# Patient Record
Sex: Female | Born: 1945
Health system: Southern US, Community
[De-identification: ages and names within clinical notes are randomized; demographics above are authoritative.]

## PROBLEM LIST (undated history)

## (undated) DIAGNOSIS — R197 Diarrhea, unspecified: Secondary | ICD-10-CM

## (undated) DIAGNOSIS — F329 Major depressive disorder, single episode, unspecified: Secondary | ICD-10-CM

## (undated) DIAGNOSIS — E559 Vitamin D deficiency, unspecified: Secondary | ICD-10-CM

## (undated) DIAGNOSIS — E119 Type 2 diabetes mellitus without complications: Secondary | ICD-10-CM

## (undated) DIAGNOSIS — K5900 Constipation, unspecified: Secondary | ICD-10-CM

## (undated) DIAGNOSIS — G4700 Insomnia, unspecified: Secondary | ICD-10-CM

## (undated) DIAGNOSIS — C189 Malignant neoplasm of colon, unspecified: Secondary | ICD-10-CM

## (undated) DIAGNOSIS — J449 Chronic obstructive pulmonary disease, unspecified: Secondary | ICD-10-CM

## (undated) DIAGNOSIS — D62 Acute posthemorrhagic anemia: Secondary | ICD-10-CM

## (undated) DIAGNOSIS — E785 Hyperlipidemia, unspecified: Secondary | ICD-10-CM

## (undated) DIAGNOSIS — I272 Pulmonary hypertension, unspecified: Secondary | ICD-10-CM

## (undated) DIAGNOSIS — K56609 Unspecified intestinal obstruction, unspecified as to partial versus complete obstruction: Secondary | ICD-10-CM

## (undated) DIAGNOSIS — I10 Essential (primary) hypertension: Secondary | ICD-10-CM

## (undated) DIAGNOSIS — B3731 Acute candidiasis of vulva and vagina: Secondary | ICD-10-CM

## (undated) DIAGNOSIS — R4182 Altered mental status, unspecified: Secondary | ICD-10-CM

## (undated) DIAGNOSIS — F32A Depression, unspecified: Secondary | ICD-10-CM

## (undated) DIAGNOSIS — K5790 Diverticulosis of intestine, part unspecified, without perforation or abscess without bleeding: Secondary | ICD-10-CM

## (undated) DIAGNOSIS — R0902 Hypoxemia: Secondary | ICD-10-CM

## (undated) DIAGNOSIS — B373 Candidiasis of vulva and vagina: Secondary | ICD-10-CM

## (undated) DIAGNOSIS — D128 Benign neoplasm of rectum: Secondary | ICD-10-CM

## (undated) DIAGNOSIS — E210 Primary hyperparathyroidism: Secondary | ICD-10-CM

## (undated) DIAGNOSIS — J9611 Chronic respiratory failure with hypoxia: Secondary | ICD-10-CM

## (undated) DIAGNOSIS — Z9981 Dependence on supplemental oxygen: Secondary | ICD-10-CM

## (undated) DIAGNOSIS — F419 Anxiety disorder, unspecified: Secondary | ICD-10-CM

## (undated) HISTORY — DX: Depression, unspecified: F32.A

## (undated) HISTORY — DX: Acute candidiasis of vulva and vagina: B37.31

## (undated) HISTORY — DX: Constipation, unspecified: K59.00

## (undated) HISTORY — DX: Acute posthemorrhagic anemia: D62

## (undated) HISTORY — DX: Vitamin D deficiency, unspecified: E55.9

## (undated) HISTORY — DX: Benign neoplasm of rectum: D12.8

## (undated) HISTORY — PX: BREAST EXCISIONAL BIOPSY: SUR124

## (undated) HISTORY — PX: COLON RESECTION: SHX5231

## (undated) HISTORY — DX: Type 2 diabetes mellitus without complications: E11.9

## (undated) HISTORY — DX: Altered mental status, unspecified: R41.82

## (undated) HISTORY — DX: Primary hyperparathyroidism: E21.0

## (undated) HISTORY — DX: Unspecified intestinal obstruction, unspecified as to partial versus complete obstruction: K56.609

## (undated) HISTORY — PX: COLONOSCOPY: SHX174

## (undated) HISTORY — DX: Major depressive disorder, single episode, unspecified: F32.9

## (undated) HISTORY — DX: Diverticulosis of intestine, part unspecified, without perforation or abscess without bleeding: K57.90

## (undated) HISTORY — DX: Pulmonary hypertension, unspecified: I27.20

## (undated) HISTORY — DX: Dependence on supplemental oxygen: Z99.81

## (undated) HISTORY — DX: Chronic obstructive pulmonary disease, unspecified: J44.9

## (undated) HISTORY — DX: Hyperlipidemia, unspecified: E78.5

## (undated) HISTORY — DX: Anxiety disorder, unspecified: F41.9

## (undated) HISTORY — DX: Insomnia, unspecified: G47.00

## (undated) HISTORY — DX: Candidiasis of vulva and vagina: B37.3

## (undated) HISTORY — DX: Malignant neoplasm of colon, unspecified: C18.9

## (undated) HISTORY — DX: Chronic respiratory failure with hypoxia: J96.11

## (undated) HISTORY — PX: BREAST BIOPSY: SHX20

---
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
## 1986-01-02 DIAGNOSIS — C189 Malignant neoplasm of colon, unspecified: Secondary | ICD-10-CM

## 1986-01-02 HISTORY — DX: Malignant neoplasm of colon, unspecified: C18.9

## 1986-06-11 DIAGNOSIS — Z85038 Personal history of other malignant neoplasm of large intestine: Secondary | ICD-10-CM

## 1986-06-11 HISTORY — DX: Personal history of other malignant neoplasm of large intestine: Z85.038

## 2007-01-03 HISTORY — PX: ENDOMETRIAL BIOPSY: SHX622

## 2013-02-10 DIAGNOSIS — Z860100 Personal history of colon polyps, unspecified: Secondary | ICD-10-CM

## 2013-02-10 HISTORY — DX: Personal history of colonic polyps: Z86.010

## 2013-02-10 HISTORY — DX: Personal history of colon polyps, unspecified: Z86.0100

## 2013-02-23 DIAGNOSIS — D128 Benign neoplasm of rectum: Secondary | ICD-10-CM

## 2013-02-23 HISTORY — DX: Benign neoplasm of rectum: D12.8

## 2013-05-02 LAB — HM COLONOSCOPY

## 2013-10-02 LAB — HM DEXA SCAN

## 2014-12-03 LAB — HM PAP SMEAR: HM Pap smear: NORMAL

## 2015-01-03 HISTORY — PX: INCONTINENCE SURGERY: SHX676

## 2015-08-03 HISTORY — PX: ABDOMINAL HYSTERECTOMY: SHX81

## 2015-10-03 LAB — HM MAMMOGRAPHY: HM Mammogram: NORMAL (ref 0–4)

## 2015-11-18 ENCOUNTER — Emergency Department (HOSPITAL_COMMUNITY): Payer: Self-pay

## 2015-11-18 ENCOUNTER — Emergency Department (HOSPITAL_COMMUNITY)
Admission: EM | Admit: 2015-11-18 | Discharge: 2015-11-18 | Disposition: A | Discharge status: Home / Self Care | Payer: Self-pay | Attending: Emergency Medicine | Admitting: Emergency Medicine

## 2015-11-18 ENCOUNTER — Encounter (HOSPITAL_COMMUNITY): Payer: Self-pay | Admitting: Emergency Medicine

## 2015-11-18 DIAGNOSIS — R410 Disorientation, unspecified: Secondary | ICD-10-CM | POA: Insufficient documentation

## 2015-11-18 DIAGNOSIS — I10 Essential (primary) hypertension: Secondary | ICD-10-CM | POA: Insufficient documentation

## 2015-11-18 HISTORY — DX: Essential (primary) hypertension: I10

## 2015-11-18 LAB — CBC
HCT: 38.5 % (ref 36.0–46.0)
Hemoglobin: 13.1 g/dL (ref 12.0–15.0)
MCH: 27.6 pg (ref 26.0–34.0)
MCHC: 34 g/dL (ref 30.0–36.0)
MCV: 81.2 fL (ref 78.0–100.0)
Platelets: 226 10*3/uL (ref 150–400)
RBC: 4.74 MIL/uL (ref 3.87–5.11)
RDW: 14.6 % (ref 11.5–15.5)
WBC: 5.3 10*3/uL (ref 4.0–10.5)

## 2015-11-18 LAB — URINE MICROSCOPIC-ADD ON

## 2015-11-18 LAB — COMPREHENSIVE METABOLIC PANEL
ALBUMIN: 4.7 g/dL (ref 3.5–5.0)
ALK PHOS: 77 U/L (ref 38–126)
ALT: 16 U/L (ref 14–54)
AST: 25 U/L (ref 15–41)
Anion gap: 10 (ref 5–15)
BILIRUBIN TOTAL: 0.7 mg/dL (ref 0.3–1.2)
BUN: 14 mg/dL (ref 6–20)
CALCIUM: 11.9 mg/dL — AB (ref 8.9–10.3)
CO2: 25 mmol/L (ref 22–32)
Chloride: 106 mmol/L (ref 101–111)
Creatinine, Ser: 1.19 mg/dL — ABNORMAL HIGH (ref 0.44–1.00)
GFR calc Af Amer: 52 mL/min — ABNORMAL LOW (ref >60)
GFR calc non Af Amer: 45 mL/min — ABNORMAL LOW (ref >60)
GLUCOSE: 120 mg/dL — AB (ref 65–99)
Potassium: 3.4 mmol/L — ABNORMAL LOW (ref 3.5–5.1)
Sodium: 141 mmol/L (ref 135–145)
TOTAL PROTEIN: 7.6 g/dL (ref 6.5–8.1)

## 2015-11-18 LAB — URINALYSIS, ROUTINE W REFLEX MICROSCOPIC
BILIRUBIN URINE: NEGATIVE
GLUCOSE, UA: NEGATIVE mg/dL
HGB URINE DIPSTICK: NEGATIVE
Ketones, ur: NEGATIVE mg/dL
Nitrite: NEGATIVE
PROTEIN: 30 mg/dL — AB
SPECIFIC GRAVITY, URINE: 1.02 (ref 1.005–1.030)
pH: 6.5 (ref 5.0–8.0)

## 2015-11-18 LAB — CBG MONITORING, ED: GLUCOSE-CAPILLARY: 112 mg/dL — AB (ref 65–99)

## 2015-11-18 NOTE — ED Notes (Signed)
Went in to speak with pt and pt was not in room nor was family gown was found on the bed

## 2015-11-18 NOTE — ED Notes (Signed)
Pt left prior to discharge instructions

## 2015-11-18 NOTE — ED Triage Notes (Signed)
Pt here for some confusion x 3 days; per family pt was talking about people that may not have been there today; pt alert at present

## 2015-11-18 NOTE — ED Provider Notes (Signed)
[facility] DEPT Provider Note   CSN: [id number] Arrival date & time: 11/18/15  1806     History   Chief Complaint Chief Complaint  Patient presents with  . Altered Mental Status    HPI Denise [name] is a 70 y.o. female.  She presents for evaluation of periods of confusion including seeing people who were not actually there. According to her daughter. The patient moved to [place], one week ago from [place]. She lives here 30 years ago. Her daughter noticed some difficulty when the patient was visiting here in August 2017. At that time, the patient was intermittently confused. Since moving home, the daughter has noted that the patient has gotten lost, when driving, and needed help to get back home. She has been having trouble sleeping, so is using a sleeping pill about every other night. She has also had decreased appetite for several days. She denies fever, chills, nausea, vomiting, cough, shortness of breath, chest pain, weakness or dizziness. There are no other known modifying factors.  HPI  Past Medical History:  Diagnosis Date  . Hypertension     There are no active problems to display for this patient.   History reviewed. No pertinent surgical history.  OB History    No data available       Home Medications    Prior to Admission medications   Not on File    Family History History reviewed. No pertinent family history.  Social History Social History  Substance Use Topics  . Smoking status: Never Smoker  . Smokeless tobacco: Never Used  . Alcohol use No     Allergies   Patient has no known allergies.   Review of Systems Review of Systems  All other systems reviewed and are negative.    Physical Exam Updated Vital Signs BP 165/88 (BP Location: Left Arm)   Pulse 79   Temp 98.1 F (36.7 C) (Oral)   Resp 18   SpO2 94%   Physical Exam  Constitutional: She is oriented to person, place, and time. She appears well-developed. No  distress.  Elderly, frail  HENT:  Head: Normocephalic and atraumatic.  Eyes: Conjunctivae and EOM are normal. Pupils are equal, round, and reactive to light.  Neck: Normal range of motion and phonation normal. Neck supple.  Cardiovascular: Normal rate and regular rhythm.   Pulmonary/Chest: Effort normal and breath sounds normal. She exhibits no tenderness.  Abdominal: Soft. She exhibits no distension. There is no tenderness. There is no guarding.  Musculoskeletal: Normal range of motion.  Neurological: She is alert and oriented to person, place, and time. She exhibits normal muscle tone.  No dysarthria and aphasia or nystagmus  Skin: Skin is warm and dry.  Psychiatric: She has a normal mood and affect. Her behavior is normal. Judgment and thought content normal.  Nursing note and vitals reviewed.    ED Treatments / Results  Labs (all labs ordered are listed, but only abnormal results are displayed) Labs Reviewed  COMPREHENSIVE METABOLIC PANEL - Abnormal; Notable for the following:       Result Value   Potassium 3.4 (*)    Glucose, Bld 120 (*)    Creatinine, Ser 1.19 (*)    Calcium 11.9 (*)    GFR calc non Af Amer 45 (*)    GFR calc Af Amer 52 (*)    All other components within normal limits  URINALYSIS, ROUTINE W REFLEX MICROSCOPIC (NOT AT [facility]) - Abnormal; Notable for the following:    APPearance  CLOUDY (*)    Protein, ur 30 (*)    Leukocytes, UA SMALL (*)    All other components within normal limits  URINE MICROSCOPIC-ADD ON - Abnormal; Notable for the following:    Squamous Epithelial / LPF 6-30 (*)    Bacteria, UA FEW (*)    Casts HYALINE CASTS (*)    Crystals CA OXALATE CRYSTALS (*)    All other components within normal limits  CBG MONITORING, ED - Abnormal; Notable for the following:    Glucose-Capillary 112 (*)    All other components within normal limits  CBC    EKG  EKG Interpretation None       Radiology Ct Head Wo Contrast  Result Date:  11/18/2015 CLINICAL DATA:  Confusion. EXAM: CT HEAD WITHOUT CONTRAST TECHNIQUE: Contiguous axial images were obtained from the base of the skull through the vertex without intravenous contrast. COMPARISON:  None. FINDINGS: Brain: The age related cerebral atrophy, ventriculomegaly and periventricular white matter disease. No extra-axial fluid collections are identified. No CT findings for acute hemispheric infarction or intracranial hemorrhage. No mass lesions are identified. Vascular: Minimal vascular calcifications. No obvious aneurysm or hyperdense vessels. Skull: No skull fracture or bone lesion. Sinuses/Orbits: The paranasal sinuses and mastoid air cells are clear. The globes are intact. Other: No scalp lesion or scalp hematoma. IMPRESSION: 1. Age related cerebral atrophy, ventriculomegaly and periventricular white matter disease. 2. No CT findings for acute infarction or hemorrhage. Electronically Signed   By: Marijo Sanes M.D.   On: 11/18/2015 19:24    Procedures Procedures (including critical care time)  Medications Ordered in ED Medications - No data to display   Initial Impression / Assessment and Plan / ED Course  I have reviewed the triage vital signs and the nursing notes.  Pertinent labs & imaging results that were available during my care of the patient were reviewed by me and considered in my medical decision making (see chart for details).  Clinical Course     Medications - No data to display  Patient Vitals for the past 24 hrs:  BP Temp Temp src Pulse Resp SpO2  11/18/15 2037 165/88 98.1 F (36.7 C) Oral 79 18 94 %  11/18/15 1815 (!) 178/105 98.7 F (37.1 C) Oral 92 17 94 %    9:22 PM Reevaluation with update and discussion. After initial assessment and treatment, an updated evaluation reveals No change in clinical status. Findings discussed with patient and family members, all questions answered. Banner Huckaba L    Final Clinical Impressions(s) / ED Diagnoses    Final diagnoses:  Confusion   Nonspecific intermittent confusion, with normal neurologic and psychiatric evaluation. Differential diagnosis includes early onset dementia, medication related, confusion, and disorientation syndrome from recently moving back to this city. No CVA, metabolic instability, serious bacterial infection.  Nursing Notes Reviewed/ Care Coordinated Applicable Imaging Reviewed Interpretation of Laboratory Data incorporated into ED treatment  The patient appears reasonably screened and/or stabilized for discharge and I doubt any other medical condition or other [facility] requiring further screening, evaluation, or treatment in the ED at this time prior to discharge.  Plan: Home Medications- continue; Home Treatments- rest; return here if the recommended treatment, does not improve the symptoms; Recommended follow up- PCP f/u asap   New Prescriptions New Prescriptions   No medications on file     Daleen Bo, MD 11/18/15 2125

## 2015-11-18 NOTE — ED Notes (Signed)
Pt. Wheeled in to room with family appears in no distress family at bedside

## 2015-11-18 NOTE — Discharge Instructions (Signed)
Get plenty of rest and drink a lot of fluids.  Consider stopping your nighttime sleep aid medication.  Follow-up with a primary care doctor as soon as possible for further testing and treatment.

## 2015-11-22 ENCOUNTER — Encounter: Payer: Self-pay | Admitting: Primary Care

## 2015-11-22 ENCOUNTER — Ambulatory Visit (INDEPENDENT_AMBULATORY_CARE_PROVIDER_SITE_OTHER): Payer: Self-pay | Admitting: Primary Care

## 2015-11-22 VITALS — BP 122/76 | HR 72 | Temp 98.7°F | Ht 65.0 in | Wt 156.8 lb

## 2015-11-22 DIAGNOSIS — J449 Chronic obstructive pulmonary disease, unspecified: Secondary | ICD-10-CM

## 2015-11-22 DIAGNOSIS — R4182 Altered mental status, unspecified: Secondary | ICD-10-CM | POA: Insufficient documentation

## 2015-11-22 DIAGNOSIS — I10 Essential (primary) hypertension: Secondary | ICD-10-CM | POA: Insufficient documentation

## 2015-11-22 DIAGNOSIS — F419 Anxiety disorder, unspecified: Secondary | ICD-10-CM

## 2015-11-22 DIAGNOSIS — E559 Vitamin D deficiency, unspecified: Secondary | ICD-10-CM

## 2015-11-22 DIAGNOSIS — F32A Depression, unspecified: Secondary | ICD-10-CM | POA: Insufficient documentation

## 2015-11-22 DIAGNOSIS — G4700 Insomnia, unspecified: Secondary | ICD-10-CM | POA: Insufficient documentation

## 2015-11-22 DIAGNOSIS — F329 Major depressive disorder, single episode, unspecified: Secondary | ICD-10-CM

## 2015-11-22 DIAGNOSIS — E785 Hyperlipidemia, unspecified: Secondary | ICD-10-CM

## 2015-11-22 DIAGNOSIS — F418 Other specified anxiety disorders: Secondary | ICD-10-CM

## 2015-11-22 HISTORY — DX: Chronic obstructive pulmonary disease, unspecified: J44.9

## 2015-11-22 HISTORY — DX: Essential (primary) hypertension: I10

## 2015-11-22 MED ORDER — ALBUTEROL SULFATE HFA 108 (90 BASE) MCG/ACT IN AERS: 2.0000 | INHALATION_SPRAY | Freq: Four times a day (QID) | RESPIRATORY_TRACT | 2 refills | Status: DC | PRN

## 2015-11-22 MED ORDER — SERTRALINE HCL 100 MG PO TABS: 100.0000 mg | ORAL_TABLET | Freq: Every day | ORAL | 1 refills | Status: DC

## 2015-11-22 NOTE — Assessment & Plan Note (Signed)
Smoker of 40+ years, quit in 2011. Feels well managed on Spiriva, Rx provided for albuterol to use PRN. Discussed indications for use of albuterol.

## 2015-11-22 NOTE — Assessment & Plan Note (Signed)
Diagnosed years ago, feels well managed on Zoloft 100 mg. Continue same.

## 2015-11-22 NOTE — Patient Instructions (Addendum)
I sent an inhaler to your pharmacy called Albuterol. This is a rescue inhaler only to be used for breakthrough shortness of breath or wheezing. You may inhale 2 puffs into the lungs every 6 hours as needed for shortness of breath. Continue your Spiriva twice daily as prescribed.  Start your Vitamin D 50,000 capsules once weekly. Please schedule a lab only appointment in 3 months to recheck these levels.  Stop taking Zopiclone as this is likely causing memory problems. Try Melatonin 5 mg tablets for difficulty sleeping. Take 1 tablet by mouth 1 hour prior to bedtime. Please notify me if no improvement in 1 month.  I will obtain your records from [place] and will determine when I'd like to see you back for follow up.  It was a pleasure to meet you today! Please don't hesitate to call me with any questions. Welcome to [facility]!

## 2015-11-22 NOTE — Progress Notes (Signed)
Subjective:    Patient ID: Denise [name], female    DOB: July 26, 1945, 70 y.o.   MRN: [id number]  HPI  Denise [name] is a 70 year old female who presents today to establish care and discuss the problems mentioned below. Will obtain old records. She recently moved to the [place] from [place]. Her last physical was in September 2017.   1) Insomnia: Currently managed on Zopiclone 7.5 mg for the past 1 year. She takes this medication as she has difficulty sleeping due to constant ringing to both ears. She's tried Melatonin in the past with improvement. She has noticed memory issues.  2) COPD: She was smoker for 40+ years, and stopped smoking in 2011. Currently managed on Spiriva Handihaler. She does occasionally experience shortness of breath with moderate exertion. She does not currently possess an albuterol inhaler.   3) Vitamin D Deficiency: Long history of vitamin D deficiency. Recently prescribed Vitamin D 50,000 unit capsules for which she's not started at this point.   4) Anxiety and Depression: Diagnosed years ago. Currently managed on Zoloft 100 mg. She feels well managed on this medication. She denies SI/HI.  5) Essential Hypertension: Currently managed on nifedipine 60 mg an losartan 100 mg. Denies blurred vision, chest pain, dizziness. Her BP in the office today is 122/76.  6) Hyperlipidemia: Currently managed on atorvastatin 10 mg. She's endorses a recent lipid panel which was normal. She denies myalgias.  7) Memory Loss: Presented to the [facility] on 11/18/15 with a chief complaint of altered mental status. During her stay in the emergency department she underwent lab testing including CBC, CMP, UA. CMP with hyperglycemia (not fasting) and decreased renal function with GFR of 52. CT head was also obtained which was unremarkable. She was discharged home with recommendations for close PCP follow up.  She recently moved to the [place] from [place]. She was visiting in August 2017 when  her daughter noticed intermittent confusion, but didn't think anything of tihs. Since she's moved to the [place] she has gotten lost when driving and has forgotten how to get home.   Her sister is with her today who reports she's seeing people who weren't acutally there. No family history of dementia or Alzheimer's Disease.   Review of Systems  Constitutional: Negative for unexpected weight change.  Eyes: Negative for visual disturbance.  Respiratory: Negative for shortness of breath.   Cardiovascular: Negative for chest pain.  Genitourinary:       Post menopausal  Musculoskeletal: Negative for myalgias.  Allergic/Immunologic: Negative for environmental allergies.  Neurological: Negative for dizziness and weakness.  Hematological: Negative for adenopathy.  Psychiatric/Behavioral: Positive for confusion and sleep disturbance. Negative for suicidal ideas. The patient is not nervous/anxious.        Past Medical History:  Diagnosis Date  . Altered mental status   . Anxiety and depression   . COPD (chronic obstructive pulmonary disease) ([place])   . Hypertension   . Insomnia   . Vitamin D deficiency      Social History   Social History  . Marital status: Married    Spouse name: N/A  . Number of children: N/A  . Years of education: N/A   Occupational History  . Not on file.   Social History Main Topics  . Smoking status: Never Smoker  . Smokeless tobacco: Never Used  . Alcohol use No  . Drug use: No  . Sexual activity: Not on file   Other Topics Concern  . Not on file  Social History Narrative   Married.   Lives alone, husband to be joining her from [place].   Retired.       No past surgical history on file.  No family history on file.  No Known Allergies  No current outpatient prescriptions on file prior to visit.   No current facility-administered medications on file prior to visit.     BP 122/76   Pulse 72   Temp 98.7 F (37.1 C) (Oral)   Ht _0  (1.651  m)   Wt 156 lb 12.8 oz (71.1 kg)   SpO2 92%   BMI 26.09 kg/m    Objective:   Physical Exam  Constitutional: She is oriented to person, place, and time. She appears well-nourished.  Eyes: EOM are normal. Pupils are equal, round, and reactive to light.  Neck: Neck supple.  Cardiovascular: Normal rate and regular rhythm.   Pulmonary/Chest: Effort normal and breath sounds normal.  Neurological: She is alert and oriented to person, place, and time. No cranial nerve deficit.  Skin: Skin is warm and dry.  Psychiatric: She has a normal mood and affect.          Assessment & Plan:

## 2015-11-22 NOTE — Assessment & Plan Note (Signed)
Family initially noticed in August 2017. ED visit last week for AMS, unremarkable work up. Suspect this is due to either early onset dementia or her zopiclone.  Discussed to refrain from use of zopiclone as this is a high risk medication that is not prescribed in the [place]. Neuro exam unremarkable. She is alert and oriented and was able to provide all of the information for her visit today.  All ED labs, imaging, and notes reviewed.

## 2015-11-22 NOTE — Assessment & Plan Note (Signed)
Discussed to stop Zopiclone as this is likely contributing to confusion. Will have her try Melatonin 5 mg 1 hour HS. She will report if no improvement.

## 2015-11-22 NOTE — Assessment & Plan Note (Signed)
Managed on nifedipine 60 mg and losartan 100 mg. BP stable today. Will continue to monitor.

## 2015-11-22 NOTE — Assessment & Plan Note (Signed)
Managed on Lipitor 10 mg. Will obtain records for last lipid panel and LFT's.

## 2015-11-22 NOTE — Assessment & Plan Note (Signed)
Managed on 50,000 unit capsules, will have her start today. Will obtain records for last vitamin d level. Also repeat vitamin D in 3 months.

## 2016-01-22 ENCOUNTER — Encounter (HOSPITAL_COMMUNITY): Payer: Self-pay

## 2016-01-22 ENCOUNTER — Ambulatory Visit (HOSPITAL_COMMUNITY)
Admission: EM | Admit: 2016-01-22 | Discharge: 2016-01-22 | Disposition: A | Discharge status: Home / Self Care | Payer: Self-pay | Attending: Family Medicine | Admitting: Family Medicine

## 2016-01-22 DIAGNOSIS — R059 Cough, unspecified: Secondary | ICD-10-CM

## 2016-01-22 DIAGNOSIS — R05 Cough: Secondary | ICD-10-CM

## 2016-01-22 MED ORDER — IPRATROPIUM-ALBUTEROL 0.5-2.5 (3) MG/3ML IN SOLN
3.0000 mL | Freq: Once | RESPIRATORY_TRACT | Status: AC
  Administered 2016-01-22: 3 mL via RESPIRATORY_TRACT

## 2016-01-22 MED ORDER — ATORVASTATIN CALCIUM 10 MG PO TABS: 10.0000 mg | ORAL_TABLET | Freq: Every day | ORAL | 0 refills | Status: DC

## 2016-01-22 MED ORDER — IPRATROPIUM-ALBUTEROL 0.5-2.5 (3) MG/3ML IN SOLN
RESPIRATORY_TRACT | Status: AC
  Filled 2016-01-22: qty 3

## 2016-01-22 MED ORDER — AZITHROMYCIN 250 MG PO TABS: 250.0000 mg | ORAL_TABLET | Freq: Every day | ORAL | 0 refills | Status: AC

## 2016-01-22 MED ORDER — ALBUTEROL SULFATE HFA 108 (90 BASE) MCG/ACT IN AERS: 1.0000 | INHALATION_SPRAY | Freq: Four times a day (QID) | RESPIRATORY_TRACT | 0 refills | Status: DC | PRN

## 2016-01-22 NOTE — ED Provider Notes (Signed)
CSN: [id number]     Arrival date & time 01/22/16  1202 History   First MD Initiated Contact with Patient 01/22/16 1308     Chief Complaint  Patient presents with  . Cough   (Consider location/radiation/quality/duration/timing/severity/associated sxs/prior Treatment) Reports coughing for over 1 week with no improvement, but finding some relief with OTC delsym and coricidin. She has a history of COPD, is currently on Spiriva daily for her COPD. Patient reports that her COPD is normally well-controlled.  Patient recently moved to the area, stating that she currently does not have PCP. Patient is requesting refill of her atorvastatin 10 mg.    The history is provided by the patient.  Cough  Cough characteristics:  Productive Sputum characteristics:  Clear Severity:  Moderate Duration: over 1 week. Timing: was constant at first, now intermittent. Progression:  Unchanged Chronicity:  New Smoker: no   Context: not animal exposure, not exposure to allergens, not fumes, not smoke exposure and not upper respiratory infection   Relieved by:  Cough suppressants Associated symptoms: rhinorrhea and sinus congestion   Associated symptoms: no chest pain, no chills, no fever, no headaches, no shortness of breath, no sore throat and no wheezing     Past Medical History:  Diagnosis Date  . Altered mental status   . Anxiety and depression   . COPD (chronic obstructive pulmonary disease) ([place])   . Hyperlipidemia   . Hypertension   . Insomnia   . Vitamin D deficiency    Past Surgical History:  Procedure Laterality Date  . ABDOMINAL HYSTERECTOMY  08/2015   History reviewed. No pertinent family history. Social History  Substance Use Topics  . Smoking status: Never Smoker  . Smokeless tobacco: Never Used  . Alcohol use No   OB History    No data available     Review of Systems  Constitutional: Negative for chills, fatigue and fever.       As stated in the HPI  HENT: Positive for  congestion and rhinorrhea. Negative for sore throat.   Respiratory: Positive for cough. Negative for shortness of breath and wheezing.   Cardiovascular: Negative for chest pain and palpitations.  Gastrointestinal: Negative for abdominal pain.  Neurological: Negative for dizziness and headaches.    Allergies  Patient has no known allergies.  Home Medications   Prior to Admission medications   Medication Sig Start Date End Date Taking? Authorizing Provider  aspirin EC 81 MG tablet Take 81 mg by mouth daily.   Yes Historical Provider, MD  losartan (COZAAR) 100 MG tablet Take 100 mg by mouth daily. 09/16/15  Yes Historical Provider, MD  NIFEdipine (PROCARDIA-XL/ADALAT CC) 60 MG 24 hr tablet Take 60 mg by mouth daily. 09/16/15  Yes Historical Provider, MD  sertraline (ZOLOFT) 100 MG tablet Take 1 tablet (100 mg total) by mouth daily. 11/22/15  Yes [name], NP  SPIRIVA HANDIHALER 18 MCG inhalation capsule INHALE 1 CAPSULE DAILY 09/16/15  Yes Historical Provider, MD  Vitamin D, Ergocalciferol, (DRISDOL) 50000 units CAPS capsule Take 50,000 Units by mouth every 7 (seven) days.   Yes Historical Provider, MD  albuterol (PROVENTIL HFA;VENTOLIN HFA) 108 (90 Base) MCG/ACT inhaler Inhale 1-2 puffs into the lungs every 6 (six) hours as needed for wheezing or shortness of breath. 01/22/16   Barry Dienes, NP  atorvastatin (LIPITOR) 10 MG tablet Take 1 tablet (10 mg total) by mouth daily. 01/22/16 04/21/16  Barry Dienes, NP  azithromycin (ZITHROMAX) 250 MG tablet Take 1 tablet (250 mg  total) by mouth daily. Take first 2 tablets together, then 1 every day until finished. 01/22/16 01/27/16  Barry Dienes, NP  ibuprofen (ADVIL,MOTRIN) 600 MG tablet TAKE 1 TABLET EVERY 6 HOURS AS NEEDED FOR PAIN (MODERATE PAIN) 09/20/15   Historical Provider, MD   Meds Ordered and Administered this Visit   Medications  ipratropium-albuterol (DUONEB) 0.5-2.5 (3) MG/3ML nebulizer solution 3 mL (3 mLs Nebulization Given 01/22/16 1324)     BP 145/79 (BP Location: Right Arm)   Pulse 79   Temp 98.3 F (36.8 C) (Oral)   Resp 16   SpO2 95%  No data found.   Physical Exam  Constitutional: She is oriented to person, place, and time. She appears well-developed and well-nourished.  Eyes: Pupils are equal, round, and reactive to light.  Neck: Normal range of motion.  Cardiovascular: Normal rate, regular rhythm and normal heart sounds.   Pulmonary/Chest: Effort normal.  Has diffused wheezing throughout lung fields. No acute distress.  Abdominal: Soft. Bowel sounds are normal. There is no tenderness.  Neurological: She is alert and oriented to person, place, and time.  Skin: Skin is warm and dry.  Nursing note and vitals reviewed.   Urgent Care Course     Procedures (including critical care time)  Labs Review Labs Reviewed - No data to display  Imaging Review No results found.   MDM   1. Cough    1) DuoNeb given; wheezing completely resolved after treatment 2) Start Z-pack 3) Start albuterol inhaler at home every 6 hours PRN for wheezing. Continue the Spiriva 4) Continue with Delsym OTC for coughing 5) Atorvastatin refilled per request 6) Recommended to establish care with a PCP ASAP and follow up next week.     Barry Dienes, NP 01/22/16 1339

## 2016-01-22 NOTE — ED Triage Notes (Signed)
Patient presents to [facility] with complaints of productive cough x1 week, pt has taken OTC medication to treat cough with little relief. Pt has history of COPD No acute distress

## 2016-02-22 ENCOUNTER — Other Ambulatory Visit (INDEPENDENT_AMBULATORY_CARE_PROVIDER_SITE_OTHER): Payer: Self-pay

## 2016-02-22 DIAGNOSIS — E559 Vitamin D deficiency, unspecified: Secondary | ICD-10-CM

## 2016-02-22 LAB — VITAMIN D 25 HYDROXY (VIT D DEFICIENCY, FRACTURES): VITD: 80.21 ng/mL (ref 30.00–100.00)

## 2016-05-25 ENCOUNTER — Other Ambulatory Visit: Payer: Self-pay | Admitting: Primary Care

## 2016-05-25 ENCOUNTER — Other Ambulatory Visit: Payer: Self-pay

## 2016-05-25 DIAGNOSIS — I10 Essential (primary) hypertension: Secondary | ICD-10-CM

## 2016-05-25 DIAGNOSIS — E785 Hyperlipidemia, unspecified: Secondary | ICD-10-CM

## 2016-05-25 DIAGNOSIS — J449 Chronic obstructive pulmonary disease, unspecified: Secondary | ICD-10-CM

## 2016-05-25 MED ORDER — NIFEDIPINE ER 60 MG PO TB24: 60.0000 mg | ORAL_TABLET | Freq: Every day | ORAL | 0 refills | Status: DC

## 2016-05-25 MED ORDER — LOSARTAN POTASSIUM 100 MG PO TABS: 100.0000 mg | ORAL_TABLET | Freq: Every day | ORAL | 0 refills | Status: DC

## 2016-05-25 MED ORDER — ATORVASTATIN CALCIUM 10 MG PO TABS: 10.0000 mg | ORAL_TABLET | Freq: Every day | ORAL | 0 refills | Status: DC

## 2016-05-25 MED ORDER — SPIRIVA HANDIHALER 18 MCG IN CAPS: ORAL_CAPSULE | RESPIRATORY_TRACT | 2 refills | Status: DC

## 2016-05-25 NOTE — Telephone Encounter (Signed)
Ok to refill? Electronically refill request for NIFEdipine (PROCARDIA-XL/ADALAT CC) 60 MG 24 hr tablet.  Medication have not been prescribed by [facility]. Last seen on 11/22/2015

## 2016-05-25 NOTE — Telephone Encounter (Signed)
Pt left v/m requesting refill atorvastatin, losartan,nifedipine and spiriva to [facility] [address]. Pt established care on 11/22/15 and in office note Gentry Fitz NP wanted to get records from [place] to ck lipids and when pt need to be seen again; I do not see [place] records under media tab.Please advise. Pt is out of meds.

## 2016-05-30 NOTE — Telephone Encounter (Signed)
Message left for patient to return my call on 05/25/2016 and 05/30/2016

## 2016-06-01 NOTE — Telephone Encounter (Signed)
Pt scheduled OV for 6/1

## 2016-06-02 ENCOUNTER — Encounter: Payer: Self-pay | Admitting: Primary Care

## 2016-06-02 ENCOUNTER — Ambulatory Visit (INDEPENDENT_AMBULATORY_CARE_PROVIDER_SITE_OTHER)
Admission: RE | Admit: 2016-06-02 | Discharge: 2016-06-02 | Disposition: A | Discharge status: Home / Self Care | Payer: Self-pay | Source: Ambulatory Visit | Attending: Primary Care | Admitting: Primary Care

## 2016-06-02 ENCOUNTER — Ambulatory Visit (INDEPENDENT_AMBULATORY_CARE_PROVIDER_SITE_OTHER): Payer: Self-pay | Admitting: Primary Care

## 2016-06-02 VITALS — BP 128/80 | HR 70 | Temp 97.9°F | Ht 65.0 in | Wt 136.4 lb

## 2016-06-02 DIAGNOSIS — R634 Abnormal weight loss: Secondary | ICD-10-CM

## 2016-06-02 DIAGNOSIS — F419 Anxiety disorder, unspecified: Secondary | ICD-10-CM

## 2016-06-02 DIAGNOSIS — R630 Anorexia: Secondary | ICD-10-CM

## 2016-06-02 DIAGNOSIS — Z1239 Encounter for other screening for malignant neoplasm of breast: Secondary | ICD-10-CM

## 2016-06-02 DIAGNOSIS — J449 Chronic obstructive pulmonary disease, unspecified: Secondary | ICD-10-CM

## 2016-06-02 DIAGNOSIS — E119 Type 2 diabetes mellitus without complications: Secondary | ICD-10-CM

## 2016-06-02 DIAGNOSIS — I10 Essential (primary) hypertension: Secondary | ICD-10-CM

## 2016-06-02 DIAGNOSIS — E785 Hyperlipidemia, unspecified: Secondary | ICD-10-CM

## 2016-06-02 DIAGNOSIS — Z1231 Encounter for screening mammogram for malignant neoplasm of breast: Secondary | ICD-10-CM

## 2016-06-02 DIAGNOSIS — F329 Major depressive disorder, single episode, unspecified: Secondary | ICD-10-CM

## 2016-06-02 HISTORY — DX: Anorexia: R63.0

## 2016-06-02 MED ORDER — BUDESONIDE-FORMOTEROL FUMARATE 160-4.5 MCG/ACT IN AERO: 2.0000 | INHALATION_SPRAY | Freq: Two times a day (BID) | RESPIRATORY_TRACT | 1 refills | Status: DC

## 2016-06-02 MED ORDER — ALBUTEROL SULFATE HFA 108 (90 BASE) MCG/ACT IN AERS: 1.0000 | INHALATION_SPRAY | RESPIRATORY_TRACT | 0 refills | Status: DC | PRN

## 2016-06-02 MED ORDER — MIRTAZAPINE 15 MG PO TABS: 15.0000 mg | ORAL_TABLET | Freq: Every day | ORAL | 1 refills | Status: DC

## 2016-06-02 NOTE — Assessment & Plan Note (Signed)
Has been off of Zoloft for the past several months due to confusion with refills. Mild anxiety and depression noted, also with decrease in appetite. Given these symptoms, will treat with low-dose Remeron. We'll see her back in the office in one month for reevaluation.

## 2016-06-02 NOTE — Assessment & Plan Note (Signed)
Lipid panel pending today.

## 2016-06-02 NOTE — Assessment & Plan Note (Signed)
Weight loss of 20 pounds since her last visit in November 2017. Overall decrease in appetite due to early satiation.  No other alarm signs today. Labs pending today for further evaluation including A1c, TSH, hep C, HIV. Chest x-ray pending today. Mammogram pending. Initiated low-dose Remeron for appetite stimulant. Discussed to replace Glucerna shakes with actual meals. Follow-up in our clinic in 1 month for reevaluation.

## 2016-06-02 NOTE — Progress Notes (Signed)
Subjective:    Patient ID: Denise [name], female    DOB: 1945/09/21, 71 y.o.   MRN: [id number]  HPI  Denise [name] is a 71 year old female who presents today for follow up and multiple complaints.  1) Essential Hypertension: Currently managed on nifedipine 60 mg and losartan 100 mg. Her BP in the office today is 128/80. She denies chest pain, shortness of breath, dizziness.  2) Hyperlipidemia: Currently managed on Lipitor 10 mg. No recent lipid panel on file. She is fasting today.  3) COPD: Currently managed on Spiriva. Quit smoking in 2002. She's doing well on her current regimen but does notice occasional shortness of breath with exertion. She's using her albuterol infrequently. She could not afford her recent refill of Spiriva until her insurance kicks in until July 1st. She has 1 week left of her Spiriva. She does not have an updated albuterol inhaler.  4) Anxiety and Depression: Currently managed on Zoloft 100 mg. She hasn't taken her Zoloft in several months as she didn't realize she had refills at the pharmacy. She has occasional anxiety and depression and thinks the medication helped. She denies SI/HI.   5) Weight Loss: Overall her appetite has decreased and she's eating less. She feels very full quickly. Her decrease in appetite started in December 2017. She denies hemoptysis, night sweats, rectal bleeding, nausea, vomiting, abdominal pain. History of colon cancer in 1980's. She is due for her repeat colonoscopy in 2020 as she now gets them every 5 years.   Diet currently consists of:  Breakfast: Glucerna shake, cereal Lunch: Glucerna shake Dinner: Sandwich (a few bites), fast food (can't finish an adult meal) Snacks: Rarely, crackers, candy Desserts: Mints, occasional candy Beverages: Diet soda, water, coffee  Exercise: She is not exercising   Wt Readings from Last 3 Encounters:  06/02/16 136 lb 6.4 oz (61.9 kg)  11/22/15 156 lb 12.8 oz (71.1 kg)     Review of  Systems  Constitutional: Positive for appetite change and unexpected weight change. Negative for fatigue.  Respiratory: Negative for cough.        Intermittent exertional shortness of breath  Cardiovascular: Negative for chest pain.  Gastrointestinal: Negative for blood in stool.  Skin: Negative for color change.  Neurological: Negative for dizziness, weakness and headaches.  Psychiatric/Behavioral:       Mild depression and anxiety       Past Medical History:  Diagnosis Date  . Altered mental status   . Anxiety and depression   . COPD (chronic obstructive pulmonary disease) ([place])   . Hyperlipidemia   . Hypertension   . Insomnia   . Vitamin D deficiency      Social History   Social History  . Marital status: Married    Spouse name: N/A  . Number of children: N/A  . Years of education: N/A   Occupational History  . Not on file.   Social History Main Topics  . Smoking status: Never Smoker  . Smokeless tobacco: Never Used  . Alcohol use No  . Drug use: No  . Sexual activity: Not on file   Other Topics Concern  . Not on file   Social History Narrative   Married.   Lives alone, husband to be joining her from [place].   Retired.       Past Surgical History:  Procedure Laterality Date  . ABDOMINAL HYSTERECTOMY  08/2015    No family history on file.  No Known Allergies  Current Outpatient Prescriptions  on File Prior to Visit  Medication Sig Dispense Refill  . aspirin EC 81 MG tablet Take 81 mg by mouth daily.    [name] atorvastatin (LIPITOR) 10 MG tablet Take 1 tablet (10 mg total) by mouth daily. 90 tablet 0  . ibuprofen (ADVIL,MOTRIN) 600 MG tablet TAKE 1 TABLET EVERY 6 HOURS AS NEEDED FOR PAIN (MODERATE PAIN)  1  . losartan (COZAAR) 100 MG tablet Take 1 tablet (100 mg total) by mouth daily. 90 tablet 0  . NIFEdipine (PROCARDIA-XL/ADALAT CC) 60 MG 24 hr tablet Take 1 tablet (60 mg total) by mouth daily. 90 tablet 0  . SPIRIVA HANDIHALER 18 MCG inhalation  capsule INHALE 1 CAPSULE DAILY 30 capsule 2  . Vitamin D, Ergocalciferol, (DRISDOL) 50000 units CAPS capsule Take 50,000 Units by mouth every 7 (seven) days.     No current facility-administered medications on file prior to visit.     BP 128/80   Pulse 70   Temp 97.9 F (36.6 C) (Oral)   Ht 5' 5" (1.651 m)   Wt 136 lb 6.4 oz (61.9 kg)   SpO2 98%   BMI 22.70 kg/m    Objective:   Physical Exam  Constitutional: She is oriented to person, place, and time. She appears well-nourished.  Neck: Neck supple.  Cardiovascular: Normal rate and regular rhythm.   Pulmonary/Chest: Effort normal and breath sounds normal.  Neurological: She is alert and oriented to person, place, and time.  Skin: Skin is warm and dry.  Psychiatric: She has a normal mood and affect.          Assessment & Plan:

## 2016-06-02 NOTE — Assessment & Plan Note (Signed)
Seem to be out of Spiriva, does occasionally experience symptoms with Spiriva. We'll trial her on Symbicort. She will update once her insurance kicks in and she is able to afford the inhaler. Perception for albuterol inhaler sent to pharmacy to use as needed.

## 2016-06-02 NOTE — Patient Instructions (Addendum)
Complete lab work and xray prior to leaving today. I will notify you of your results once received.   Start mirtazapine 15 mg tablets for depression and appetite stimulant. Take 1 tablet by mouth every night at bedtime.  I sent a medication for your breathing to the pharmacy called Symbicort (budesonide-formoterol). Inhale 2 puffs into the lungs twice daily.   Please schedule a follow up visit in 1 month to re-evaluate weight loss and depression.  Call the [facility] to schedule your mammogram.   It was a pleasure to see you today!

## 2016-06-02 NOTE — Assessment & Plan Note (Signed)
Stable in the office today.  Continue current regimen. 

## 2016-06-03 LAB — CBC
HCT: 44.7 % (ref 35.0–45.0)
Hemoglobin: 14.7 g/dL (ref 11.7–15.5)
MCH: 27.7 pg (ref 27.0–33.0)
MCHC: 32.9 g/dL (ref 32.0–36.0)
MCV: 84.3 fL (ref 80.0–100.0)
MPV: 10.8 fL (ref 7.5–12.5)
PLATELETS: 217 10*3/uL (ref 140–400)
RBC: 5.3 MIL/uL — ABNORMAL HIGH (ref 3.80–5.10)
RDW: 14.8 % (ref 11.0–15.0)
WBC: 4.5 10*3/uL (ref 3.8–10.8)

## 2016-06-03 LAB — LIPID PANEL
CHOL/HDL RATIO: 3.2 ratio (ref <5.0)
CHOLESTEROL: 222 mg/dL — AB (ref <200)
HDL: 70 mg/dL (ref >50)
LDL Cholesterol: 124 mg/dL — ABNORMAL HIGH (ref <100)
Triglycerides: 141 mg/dL (ref <150)
VLDL: 28 mg/dL (ref <30)

## 2016-06-03 LAB — BASIC METABOLIC PANEL
BUN: 19 mg/dL (ref 7–25)
CALCIUM: 11.3 mg/dL — AB (ref 8.6–10.4)
CO2: 19 mmol/L — ABNORMAL LOW (ref 20–31)
CREATININE: 1.08 mg/dL — AB (ref 0.60–0.93)
Chloride: 105 mmol/L (ref 98–110)
GLUCOSE: 82 mg/dL (ref 65–99)
Potassium: 3.6 mmol/L (ref 3.5–5.3)
Sodium: 143 mmol/L (ref 135–146)

## 2016-06-03 LAB — HIV ANTIBODY (ROUTINE TESTING W REFLEX): HIV: NONREACTIVE

## 2016-06-03 LAB — HEMOGLOBIN A1C
Hgb A1c MFr Bld: 6 % — ABNORMAL HIGH (ref <5.7)
Mean Plasma Glucose: 126 mg/dL

## 2016-06-03 LAB — TSH: TSH: 0.49 m[IU]/L

## 2016-06-03 LAB — HEPATITIS C ANTIBODY: HCV AB: NEGATIVE

## 2016-06-05 ENCOUNTER — Other Ambulatory Visit: Payer: Self-pay | Admitting: Primary Care

## 2016-06-05 DIAGNOSIS — R634 Abnormal weight loss: Secondary | ICD-10-CM

## 2016-06-08 ENCOUNTER — Other Ambulatory Visit: Payer: Self-pay | Admitting: Primary Care

## 2016-06-08 DIAGNOSIS — F329 Major depressive disorder, single episode, unspecified: Secondary | ICD-10-CM

## 2016-06-08 DIAGNOSIS — F419 Anxiety disorder, unspecified: Principal | ICD-10-CM

## 2016-06-08 NOTE — Telephone Encounter (Signed)
Ok to refill? Electronically refill request for sertraline (ZOLOFT) 100 MG tablet.  Medication has been taken off of current medication this according to the notes on 06/02/2016 visit

## 2016-06-09 ENCOUNTER — Other Ambulatory Visit (INDEPENDENT_AMBULATORY_CARE_PROVIDER_SITE_OTHER): Payer: Self-pay

## 2016-06-09 ENCOUNTER — Other Ambulatory Visit: Payer: Self-pay | Admitting: Primary Care

## 2016-06-09 DIAGNOSIS — R634 Abnormal weight loss: Secondary | ICD-10-CM

## 2016-06-09 NOTE — Telephone Encounter (Signed)
She told me on her recent visit that she has not taken this medication in quite some time. I recently prescribed Mirtazipine that will take the place of Zoloft. Please verify that she's is in fact not taken Zoloft in a while as she mentioned last visit.

## 2016-06-13 LAB — PTH, INTACT AND CALCIUM
CALCIUM: 11.1 mg/dL — AB (ref 8.6–10.4)
PTH: 100 pg/mL — AB (ref 14–64)

## 2016-06-13 NOTE — Telephone Encounter (Signed)
Message left for patient to return my call.

## 2016-06-14 NOTE — Telephone Encounter (Signed)
Spoken to patient and inform to discard the refill request.

## 2016-06-15 ENCOUNTER — Encounter: Payer: Self-pay | Admitting: Primary Care

## 2016-06-15 ENCOUNTER — Other Ambulatory Visit: Payer: Self-pay | Admitting: Primary Care

## 2016-06-15 DIAGNOSIS — E210 Primary hyperparathyroidism: Secondary | ICD-10-CM

## 2016-07-06 ENCOUNTER — Other Ambulatory Visit: Payer: Self-pay | Admitting: Primary Care

## 2016-07-10 ENCOUNTER — Telehealth: Payer: Self-pay | Admitting: Primary Care

## 2016-07-10 DIAGNOSIS — N644 Mastodynia: Secondary | ICD-10-CM

## 2016-07-10 NOTE — Telephone Encounter (Signed)
Noted, cancelled screening mammogram, ordered diagnostic mammogram and ultrasound.

## 2016-07-10 NOTE — Telephone Encounter (Signed)
Patient called, she called [facility] to make [facility]G appt but told them she was having breast pain. They told her she had to have a diagnostic MMG and not a  Screening now. Pls cancel the screening and now pls order a Bilateral Breast Tomo diagnostic MMG. She is having Right  breast pain at [facility] and left breast pain at [facility]. Pls also order right Breast US and Left breast US also.

## 2016-07-19 ENCOUNTER — Other Ambulatory Visit: Payer: Self-pay | Admitting: Primary Care

## 2016-07-19 ENCOUNTER — Ambulatory Visit: Admission: RE | Admit: 2016-07-19 | Payer: Self-pay | Source: Ambulatory Visit

## 2016-07-19 ENCOUNTER — Ambulatory Visit: Payer: Self-pay

## 2016-07-19 ENCOUNTER — Ambulatory Visit
Admission: RE | Admit: 2016-07-19 | Discharge: 2016-07-19 | Disposition: A | Discharge status: Home / Self Care | Payer: Medicare Other | Source: Ambulatory Visit | Attending: Primary Care | Admitting: Primary Care

## 2016-07-19 DIAGNOSIS — R922 Inconclusive mammogram: Secondary | ICD-10-CM | POA: Diagnosis not present

## 2016-07-19 DIAGNOSIS — N644 Mastodynia: Secondary | ICD-10-CM

## 2016-07-19 DIAGNOSIS — R921 Mammographic calcification found on diagnostic imaging of breast: Secondary | ICD-10-CM

## 2016-07-27 ENCOUNTER — Ambulatory Visit (INDEPENDENT_AMBULATORY_CARE_PROVIDER_SITE_OTHER): Payer: Medicare Other | Admitting: Endocrinology

## 2016-07-27 ENCOUNTER — Encounter: Payer: Self-pay | Admitting: Endocrinology

## 2016-07-27 VITALS — BP 142/82 | HR 82 | Wt 144.8 lb

## 2016-07-27 DIAGNOSIS — J449 Chronic obstructive pulmonary disease, unspecified: Secondary | ICD-10-CM

## 2016-07-27 DIAGNOSIS — E210 Primary hyperparathyroidism: Secondary | ICD-10-CM | POA: Diagnosis not present

## 2016-07-27 HISTORY — DX: Primary hyperparathyroidism: E21.0

## 2016-07-27 NOTE — Progress Notes (Signed)
Subjective:    Patient ID: Denise [name], female    DOB: [birth year], 71 y.o.   MRN: [id number]  HPI Pt is referred by Dr Carlis Abbott, for hypercalcemia.  Pt was noted to have moderate hypercalcemia in 2017. she has never had osteoporosis, urolithiasis, thyroid probs, sarcoidosis, PUD, pancreatitis, depression, or bony fracture.  she does not take vitamin A supplement.  Pt denies taking antacids, Li++, or HCTZ. She stopped high-dose vit-D 1 month ago.  She has slight weight gain, but no assoc arthralgias.  She does not notice any nodule at the ant neck.   Past Medical History:  Diagnosis Date  . Altered mental status   . Anxiety and depression   . COPD (chronic obstructive pulmonary disease) ([place])   . Hyperlipidemia   . Hyperparathyroidism ([place])   . Hypertension   . Insomnia   . Vitamin D deficiency     Past Surgical History:  Procedure Laterality Date  . ABDOMINAL HYSTERECTOMY  08/2015  . BREAST BIOPSY Left   . BREAST EXCISIONAL BIOPSY Right   . COLON RESECTION  1980's  . ENDOMETRIAL BIOPSY  2009   negative  . INCONTINENCE SURGERY  2017  . POLYPECTOMY  2011    Social History   Social History  . Marital status: Married    Spouse name: N/A  . Number of children: N/A  . Years of education: N/A   Occupational History  . Not on file.   Social History Main Topics  . Smoking status: Never Smoker  . Smokeless tobacco: Never Used  . Alcohol use No  . Drug use: No  . Sexual activity: Not on file   Other Topics Concern  . Not on file   Social History Narrative   Married.   Lives alone, husband to be joining her from [place].   Retired.       Current Outpatient Prescriptions on File Prior to Visit  Medication Sig Dispense Refill  . albuterol (PROVENTIL HFA;VENTOLIN HFA) 108 (90 Base) MCG/ACT inhaler Inhale 1-2 puffs into the lungs every 4 (four) hours as needed for wheezing or shortness of breath. 1 Inhaler 0  . aspirin EC 81 MG tablet Take 81 mg by mouth daily.      [name] atorvastatin (LIPITOR) 10 MG tablet Take 1 tablet (10 mg total) by mouth daily. 90 tablet 0  . budesonide-formoterol (SYMBICORT) 160-4.5 MCG/ACT inhaler Inhale 2 puffs into the lungs 2 (two) times daily. 1 Inhaler 1  . ibuprofen (ADVIL,MOTRIN) 600 MG tablet TAKE 1 TABLET EVERY 6 HOURS AS NEEDED FOR PAIN (MODERATE PAIN)  1  . losartan (COZAAR) 100 MG tablet Take 1 tablet (100 mg total) by mouth daily. 90 tablet 0  . mirtazapine (REMERON) 15 MG tablet Take 1 tablet (15 mg total) by mouth at bedtime. 30 tablet 1  . NIFEdipine (PROCARDIA-XL/ADALAT CC) 60 MG 24 hr tablet Take 1 tablet (60 mg total) by mouth daily. 90 tablet 0  . SPIRIVA HANDIHALER 18 MCG inhalation capsule INHALE 1 CAPSULE DAILY 30 capsule 2   No current facility-administered medications on file prior to visit.     No Known Allergies  Family History  Problem Relation Age of Onset  . Breast cancer Neg Hx   . Hyperparathyroidism Neg Hx     BP (!) 142/82   Pulse 82   Wt 144 lb 12.8 oz (65.7 kg)   SpO2 92%   BMI 24.10 kg/m    Review of Systems denies fatigue, galactorrhea, hematuria, memory loss, menopausal  sxs, numbness, myalgias, abdominal pain, urinary frequency, hypoglycemia, skin rash, visual loss, diarrhea, rhinorrhea, easy bruising, and depression.  No change in chronic doe     Objective:   Physical Exam VS: see vs page GEN: no distress HEAD: head: no deformity eyes: no periorbital swelling, no proptosis external nose and ears are normal mouth: no lesion seen NECK: supple, thyroid is not enlarged. CHEST WALL: no deformity.  No kyphosis.  LUNGS: clear to auscultation CV: reg rate and rhythm, no murmur ABD: abdomen is soft, nontender.  no hepatosplenomegaly.  not distended.  no hernia MUSCULOSKELETAL: muscle bulk and strength are grossly normal.  no obvious joint swelling.  gait is normal and steady EXTEMITIES: no deformity of the hands.  no edema PULSES: no carotid bruit NEURO:  cn 2-12 grossly intact.    readily moves all 4's.  sensation is intact to touch on all 4's SKIN:  Normal texture and temperature.  No rash or suspicious lesion is visible.   NODES:  None palpable at the neck.   PSYCH: alert, well-oriented.  Does not appear anxious nor depressed.   Lab Results  Component Value Date   PTH 100 (H) 06/09/2016   PTH CANCELED 06/09/2016   CALCIUM 11.1 (H) 06/09/2016   CALCIUM CANCELED 06/09/2016   25-OH vit-D=80  DEXA (2017, in [place]): normal.    Lab Results  Component Value Date   TSH 0.49 06/02/2016   I personally reviewed spirometry tracing (today): Indication: copd Impression: severe obstruction No comparison is available     Assessment & Plan:  Primary hyperparathyroidism, new to me.  Ca++ was 11.9, so she should consider surgery COPD, severe.  She should see pulm, to discuss surgical clearance.    Patient Instructions  Please see surgery and lung specialists.  you will receive a phone call, about days and times for appointments.

## 2016-07-27 NOTE — Patient Instructions (Signed)
Please see surgery and lung specialists.  you will receive a phone call, about days and times for appointments.

## 2016-08-02 ENCOUNTER — Ambulatory Visit
Admission: RE | Admit: 2016-08-02 | Discharge: 2016-08-02 | Disposition: A | Discharge status: Home / Self Care | Payer: Medicare Other | Source: Ambulatory Visit | Attending: Primary Care | Admitting: Primary Care

## 2016-08-02 ENCOUNTER — Other Ambulatory Visit: Payer: Self-pay | Admitting: Primary Care

## 2016-08-02 DIAGNOSIS — R921 Mammographic calcification found on diagnostic imaging of breast: Secondary | ICD-10-CM

## 2016-08-02 DIAGNOSIS — N6489 Other specified disorders of breast: Secondary | ICD-10-CM | POA: Diagnosis not present

## 2016-08-03 ENCOUNTER — Other Ambulatory Visit: Payer: Self-pay | Admitting: Primary Care

## 2016-08-03 DIAGNOSIS — F329 Major depressive disorder, single episode, unspecified: Secondary | ICD-10-CM

## 2016-08-03 DIAGNOSIS — R634 Abnormal weight loss: Secondary | ICD-10-CM

## 2016-08-03 DIAGNOSIS — F419 Anxiety disorder, unspecified: Secondary | ICD-10-CM

## 2016-08-03 NOTE — Telephone Encounter (Signed)
Patient says she does think it is helping her appetite a little.  She says she has gained a few pounds.  She has been taking the medication and says it also helps her sleep.

## 2016-08-03 NOTE — Telephone Encounter (Signed)
How is her appetite? Is she taking the mirtazapine? This was prescribed to help stimulate appetite as she reported a decrease in appetite in June this year.

## 2016-08-03 NOTE — Telephone Encounter (Signed)
Ok to refill? Electronically refill request for mirtazapine (REMERON) 15 MG tablet.  Last prescribed on 06/02/2016. Last seen on 06/02/2016

## 2016-08-04 MED ORDER — MIRTAZAPINE 15 MG PO TABS: ORAL_TABLET | ORAL | 1 refills | Status: DC

## 2016-08-04 NOTE — Telephone Encounter (Signed)
Glad to hear it. Refills sent to pharmacy.

## 2016-08-21 DIAGNOSIS — E210 Primary hyperparathyroidism: Secondary | ICD-10-CM | POA: Diagnosis not present

## 2016-08-22 ENCOUNTER — Other Ambulatory Visit: Payer: Self-pay | Admitting: Primary Care

## 2016-08-22 ENCOUNTER — Other Ambulatory Visit (HOSPITAL_COMMUNITY): Payer: Self-pay | Admitting: General Surgery

## 2016-08-22 DIAGNOSIS — E210 Primary hyperparathyroidism: Secondary | ICD-10-CM

## 2016-08-22 DIAGNOSIS — E785 Hyperlipidemia, unspecified: Secondary | ICD-10-CM

## 2016-08-22 NOTE — Telephone Encounter (Signed)
Refills sent to pharmacy. Would like to recheck her cholesterol in 2 months, please schedule fasting lab only appointment.

## 2016-08-22 NOTE — Telephone Encounter (Signed)
Ok to refill? Electronically refill request for atorvastatin (LIPITOR) 10 MG tablet  Last prescribed on 05/25/2016. Lipid was checked on 06/02/2016

## 2016-08-23 NOTE — Telephone Encounter (Signed)
Left a detailed message on Vm per DPR that she needs to schedule a fasting lab appt.

## 2016-08-25 NOTE — Telephone Encounter (Signed)
Per DPR, left detail message of Denise [name]'s comments for patient to call back. 

## 2016-08-28 NOTE — Telephone Encounter (Signed)
Per DPR, left detail message of Kate's comments for patient to call back.

## 2016-08-29 ENCOUNTER — Encounter (HOSPITAL_COMMUNITY)
Admission: RE | Admit: 2016-08-29 | Discharge: 2016-08-29 | Disposition: A | Discharge status: Home / Self Care | Payer: Medicare Other | Source: Ambulatory Visit | Attending: General Surgery | Admitting: General Surgery

## 2016-08-29 DIAGNOSIS — E210 Primary hyperparathyroidism: Secondary | ICD-10-CM | POA: Diagnosis not present

## 2016-08-29 DIAGNOSIS — D351 Benign neoplasm of parathyroid gland: Secondary | ICD-10-CM | POA: Diagnosis not present

## 2016-08-29 MED ORDER — TECHNETIUM TC 99M SESTAMIBI GENERIC - CARDIOLITE
25.0000 | Freq: Once | INTRAVENOUS | Status: AC | PRN
  Administered 2016-08-29: 25 via INTRAVENOUS

## 2016-09-01 NOTE — Telephone Encounter (Signed)
Message left for patient to return my call.

## 2016-09-11 ENCOUNTER — Encounter: Payer: Self-pay | Admitting: Internal Medicine

## 2016-09-11 ENCOUNTER — Ambulatory Visit (INDEPENDENT_AMBULATORY_CARE_PROVIDER_SITE_OTHER): Payer: Medicare Other | Admitting: Internal Medicine

## 2016-09-11 VITALS — BP 138/80 | HR 71 | Ht 66.0 in | Wt 143.2 lb

## 2016-09-11 DIAGNOSIS — J449 Chronic obstructive pulmonary disease, unspecified: Secondary | ICD-10-CM | POA: Diagnosis not present

## 2016-09-11 MED ORDER — BUDESONIDE-FORMOTEROL FUMARATE 160-4.5 MCG/ACT IN AERO: 2.0000 | INHALATION_SPRAY | Freq: Two times a day (BID) | RESPIRATORY_TRACT | 1 refills | Status: DC

## 2016-09-11 NOTE — Progress Notes (Signed)
Subjective:     Patient ID: Denise [name], female   DOB: 1945/09/18,    MRN: [id number]  HPI  38 yobf  Quit smoking 1989 with doe then dx'd with copd in [place] in mid 1990s where lived x 25 years and started on spiriva around 2005-10 and helped some and since 2017 back in [place] and breathing generally better in [place] but not doing as many steps and changed from spiriva to symb but not taking it regulary and no need for rescue inhaler referred to pulmonary clinic 09/11/2016 by Dr   Malena Edman.   09/11/2016 1st [facility] office visit/ Arpan Eskelson   Chief Complaint  Patient presents with  . pulmonary consult    Referred by Dr. Carlis Abbott for COPD. patient states she is out of breath on exertion, denies chest pain and chest tightness  doe = MMRC2 = can't walk a nl pace on a flat grade s sob but does fine slow and flat eg shopping   No obvious day to day or daytime variability or assoc excess/ purulent sputum or mucus plugs or hemoptysis or cp or chest tightness, subjective wheeze or overt sinus or hb symptoms. No unusual exp hx or h/o childhood pna/ asthma or knowledge of premature birth.  Sleeping ok without nocturnal  or early am exacerbation  of respiratory  c/o's or need for noct saba. Also denies any obvious fluctuation of symptoms with weather or environmental changes or other aggravating or alleviating factors except as outlined above   Current Medications, Allergies, Complete Past Medical History, Past Surgical History, Family History, and Social History were reviewed in [facility] record.  ROS  The following are not active complaints unless bolded sore throat, dysphagia, dental problems, itching, sneezing,  nasal congestion or excess/ purulent secretions, ear ache,   fever, chills, sweats, unintended wt loss, classically pleuritic or exertional cp,  orthopnea pnd or leg swelling, presyncope, palpitations, abdominal pain, anorexia, nausea, vomiting, diarrhea   or change in bowel or bladder habits, change in stools or urine, dysuria,hematuria,  rash, arthralgias, visual complaints, headache, numbness, weakness or ataxia or problems with walking or coordination,  change in mood/affect or memory.            Review of Systems     Objective:   Physical Exam    amb pleasant  fm nad  Wt Readings from Last 3 Encounters:  07/27/16 144 lb 12.8 oz (65.7 kg)  06/02/16 136 lb 6.4 oz (61.9 kg)  11/22/15 156 lb 12.8 oz (71.1 kg)    Vital signs reviewed - Note on arrival 02 sats  96% on RA     HEENT: nl   turbinates bilaterally, and oropharynx. Nl external ear canals without cough reflex - edentulous   NECK :  without JVD/Nodes/TM/ nl carotid upstrokes bilaterally   LUNGS: no acc muscle use,  slt barrel contour chest with distant bs bilaterally s wheeze   CV:  RRR  no s3 or murmur or increase in P2, and no edema   ABD:  soft and nontender with nl inspiratory excursion in the supine position. No bruits or organomegaly appreciated, bowel sounds nl  MS:  Nl gait/ ext warm without deformities, calf tenderness, cyanosis or clubbing No obvious joint restrictions   SKIN: warm and dry without lesions    NEURO:  alert, approp, nl sensorium with  no motor or cerebellar deficits apparent.     I personally reviewed images and agree with radiology impression as follows:  CXR:   06/02/2016 COPD and mild right basilar scarring. No active cardiopulmonary Disease.    Assessment:

## 2016-09-11 NOTE — Assessment & Plan Note (Signed)
Quit smoking 1989 - Spirometry 09/11/2016  FEV1 1.03 (52%)  Ratio 54 mild curvature, on no rx - 09/11/2016  After extensive coaching HFA effectiveness =    50% from baseline 10% > continue  symb 160 2bid but consider change to bevespi or stiolto   Pt is Group B in terms of symptom/risk and laba/lama therefore appropriate rx at this point but since already has symbicort 160 suggested she continue it and f/u in 3 months to regroup then   Total time devoted to counseling  > 50 % of initial 60 min office visit:  review case with pt/ discussion of options/alternatives/ personally creating written customized instructions  in presence of pt  then going over those specific  Instructions directly with the pt including how to use all of the meds but in particular covering each new medication in detail and the difference between the maintenance= "automatic" meds and the prns using an action plan format for the latter (If this problem/symptom => do that organization reading Left to right).  Please see AVS from this visit for a full list of these instructions which I personally wrote for this pt and  are unique to this visit.

## 2016-09-11 NOTE — Patient Instructions (Addendum)
Plan A = Automatic =  symbicort 160 up to 2 every 12 hours  Work on inhaler technique:  relax and gently blow all the way out then take a nice smooth deep breath back in, triggering the inhaler at same time you start breathing in.  Hold for up to 5 seconds if you can. Blow out thru nose. Rinse and gargle with water when done      Plan B = Backup Only use your albuterol (ventolin) as a rescue medication to be used if you can't catch your breath by resting or doing a relaxed purse lip breathing pattern.  - The less you use it, the better it will work when you need it. - Ok to use the inhaler up to 2 puffs  every 4 hours if you must but call for appointment if use goes up over your usual need - Don't leave home without it !!  (think of it like the spare tire for your car)       Please schedule a follow up visit in 3 months but call sooner if needed

## 2016-09-12 ENCOUNTER — Other Ambulatory Visit: Payer: Self-pay | Admitting: Primary Care

## 2016-09-12 DIAGNOSIS — I10 Essential (primary) hypertension: Secondary | ICD-10-CM

## 2016-10-18 DIAGNOSIS — Z23 Encounter for immunization: Secondary | ICD-10-CM | POA: Diagnosis not present

## 2016-11-16 ENCOUNTER — Telehealth: Payer: Self-pay | Admitting: Primary Care

## 2016-11-16 DIAGNOSIS — E785 Hyperlipidemia, unspecified: Secondary | ICD-10-CM

## 2016-11-16 DIAGNOSIS — I10 Essential (primary) hypertension: Secondary | ICD-10-CM

## 2016-11-16 MED ORDER — ATORVASTATIN CALCIUM 10 MG PO TABS: 10.0000 mg | ORAL_TABLET | Freq: Every day | ORAL | 0 refills | Status: DC

## 2016-11-16 MED ORDER — LOSARTAN POTASSIUM 100 MG PO TABS: 100.0000 mg | ORAL_TABLET | Freq: Every day | ORAL | 1 refills | Status: DC

## 2016-11-16 NOTE — Telephone Encounter (Signed)
Copied from [place] [id number]. Topic: Quick Communication - See Telephone Encounter >> Nov 16, 2016 11:49 AM Bea Graff, NT wrote: CRM for notification. See Telephone encounter for: Patient calling to get a refill of Losartan 180m and Atorvastatine 162m Patient is completely out and no refills left at the pharmacy. [facility] [address] [facility] [place]/16[place]16.

## 2016-11-28 ENCOUNTER — Other Ambulatory Visit: Payer: Self-pay | Admitting: Primary Care

## 2016-11-28 DIAGNOSIS — E785 Hyperlipidemia, unspecified: Secondary | ICD-10-CM

## 2016-12-29 ENCOUNTER — Other Ambulatory Visit: Payer: Self-pay | Admitting: Primary Care

## 2016-12-29 DIAGNOSIS — R921 Mammographic calcification found on diagnostic imaging of breast: Secondary | ICD-10-CM

## 2017-02-05 ENCOUNTER — Ambulatory Visit
Admission: RE | Admit: 2017-02-05 | Discharge: 2017-02-05 | Disposition: A | Discharge status: Home / Self Care | Payer: Medicare HMO | Source: Ambulatory Visit | Attending: Primary Care | Admitting: Primary Care

## 2017-02-05 DIAGNOSIS — R922 Inconclusive mammogram: Secondary | ICD-10-CM | POA: Diagnosis not present

## 2017-02-05 DIAGNOSIS — R921 Mammographic calcification found on diagnostic imaging of breast: Secondary | ICD-10-CM

## 2017-02-10 ENCOUNTER — Other Ambulatory Visit: Payer: Self-pay | Admitting: Primary Care

## 2017-02-10 DIAGNOSIS — F419 Anxiety disorder, unspecified: Secondary | ICD-10-CM

## 2017-02-10 DIAGNOSIS — R634 Abnormal weight loss: Secondary | ICD-10-CM

## 2017-02-10 DIAGNOSIS — F329 Major depressive disorder, single episode, unspecified: Secondary | ICD-10-CM

## 2017-02-14 ENCOUNTER — Other Ambulatory Visit: Payer: Self-pay | Admitting: Primary Care

## 2017-02-14 DIAGNOSIS — E785 Hyperlipidemia, unspecified: Secondary | ICD-10-CM

## 2017-04-11 ENCOUNTER — Ambulatory Visit (INDEPENDENT_AMBULATORY_CARE_PROVIDER_SITE_OTHER): Payer: Medicare HMO | Admitting: Primary Care

## 2017-04-11 ENCOUNTER — Other Ambulatory Visit: Payer: Self-pay | Admitting: Primary Care

## 2017-04-11 VITALS — BP 144/92 | HR 73 | Temp 98.0°F | Ht 66.0 in | Wt 131.2 lb

## 2017-04-11 DIAGNOSIS — R7303 Prediabetes: Secondary | ICD-10-CM

## 2017-04-11 DIAGNOSIS — E785 Hyperlipidemia, unspecified: Secondary | ICD-10-CM | POA: Diagnosis not present

## 2017-04-11 DIAGNOSIS — R630 Anorexia: Secondary | ICD-10-CM

## 2017-04-11 DIAGNOSIS — I10 Essential (primary) hypertension: Secondary | ICD-10-CM

## 2017-04-11 DIAGNOSIS — F329 Major depressive disorder, single episode, unspecified: Secondary | ICD-10-CM | POA: Diagnosis not present

## 2017-04-11 DIAGNOSIS — R634 Abnormal weight loss: Secondary | ICD-10-CM

## 2017-04-11 DIAGNOSIS — J449 Chronic obstructive pulmonary disease, unspecified: Secondary | ICD-10-CM

## 2017-04-11 DIAGNOSIS — F419 Anxiety disorder, unspecified: Secondary | ICD-10-CM

## 2017-04-11 LAB — COMPREHENSIVE METABOLIC PANEL
ALK PHOS: 71 U/L (ref 39–117)
ALT: 14 U/L (ref 0–35)
AST: 20 U/L (ref 0–37)
Albumin: 4.3 g/dL (ref 3.5–5.2)
BILIRUBIN TOTAL: 0.7 mg/dL (ref 0.2–1.2)
BUN: 16 mg/dL (ref 6–23)
CALCIUM: 10.8 mg/dL — AB (ref 8.4–10.5)
CO2: 31 meq/L (ref 19–32)
Chloride: 105 mEq/L (ref 96–112)
Creatinine, Ser: 1.08 mg/dL (ref 0.40–1.20)
GFR: 64.17 mL/min (ref >60.00)
GLUCOSE: 90 mg/dL (ref 70–99)
POTASSIUM: 3.4 meq/L — AB (ref 3.5–5.1)
Sodium: 144 mEq/L (ref 135–145)
Total Protein: 7 g/dL (ref 6.0–8.3)

## 2017-04-11 LAB — CBC
HEMATOCRIT: 42.4 % (ref 36.0–46.0)
HEMOGLOBIN: 14.5 g/dL (ref 12.0–15.0)
MCHC: 34.3 g/dL (ref 30.0–36.0)
MCV: 81.9 fl (ref 78.0–100.0)
PLATELETS: 214 10*3/uL (ref 150.0–400.0)
RBC: 5.18 Mil/uL — ABNORMAL HIGH (ref 3.87–5.11)
RDW: 15.2 % (ref 11.5–15.5)
WBC: 4.7 10*3/uL (ref 4.0–10.5)

## 2017-04-11 LAB — LIPID PANEL
CHOL/HDL RATIO: 3
Cholesterol: 148 mg/dL (ref 0–200)
HDL: 50.8 mg/dL (ref >39.00)
LDL Cholesterol: 76 mg/dL (ref 0–99)
NONHDL: 97.08
TRIGLYCERIDES: 103 mg/dL (ref 0.0–149.0)
VLDL: 20.6 mg/dL (ref 0.0–40.0)

## 2017-04-11 LAB — TSH: TSH: 0.44 u[IU]/mL (ref 0.35–4.50)

## 2017-04-11 LAB — HEMOGLOBIN A1C: Hgb A1c MFr Bld: 6.2 % (ref 4.6–6.5)

## 2017-04-11 MED ORDER — SERTRALINE HCL 25 MG PO TABS: ORAL_TABLET | ORAL | 0 refills | Status: DC

## 2017-04-11 MED ORDER — LOSARTAN POTASSIUM 100 MG PO TABS: ORAL_TABLET | ORAL | 3 refills | Status: DC

## 2017-04-11 MED ORDER — NIFEDIPINE ER 60 MG PO TB24: ORAL_TABLET | ORAL | 0 refills | Status: DC

## 2017-04-11 MED ORDER — BUDESONIDE-FORMOTEROL FUMARATE 160-4.5 MCG/ACT IN AERO: 2.0000 | INHALATION_SPRAY | Freq: Two times a day (BID) | RESPIRATORY_TRACT | 3 refills | Status: DC

## 2017-04-11 MED ORDER — ALBUTEROL SULFATE HFA 108 (90 BASE) MCG/ACT IN AERS: 1.0000 | INHALATION_SPRAY | RESPIRATORY_TRACT | 0 refills | Status: DC | PRN

## 2017-04-11 MED ORDER — MIRTAZAPINE 15 MG PO TABS: ORAL_TABLET | ORAL | 1 refills | Status: DC

## 2017-04-11 MED ORDER — ATORVASTATIN CALCIUM 10 MG PO TABS: ORAL_TABLET | ORAL | 3 refills | Status: DC

## 2017-04-11 NOTE — Assessment & Plan Note (Signed)
PHQ 9 score of 18 today. Suspect depression contributing to appetite loss, will resume Zoloft at 25 mg. Continue Remeron.   Will have her follow up in 1 month for re-evaluation.

## 2017-04-11 NOTE — Assessment & Plan Note (Signed)
Repeat lipids pending today, refills sent for atorvastatin.

## 2017-04-11 NOTE — Progress Notes (Signed)
Subjective:    Patient ID: Denise [name], female    DOB: 1945-05-16, 72 y.o.   MRN: [id number]  HPI  [name] is a 72 year old female who presents today for follow up and a chief complaint of loss of appetite.   1) COPD: Currently managed on Symbicort, albuterol HFA. She's not used her Symbicort in several months as she ran out of her refills. This is also the case with her albuterol inhaler. She has noticed some exertional dyspnea.   2) Hypertension: Currently managed on Losartan 100 mg, nifedipine XL 60 mg. She's been out of her nifedipine since Fall of 2018. She's nearly out of her Losartan 100 mg.  BP Readings from Last 3 Encounters:  04/11/17 (!) 144/92  09/11/16 138/80  07/27/16 (!) 142/82     3) Anxiety and Depression: Currently managed on Remeron 15 mg, previously managed on Zoloft in the past. She's been feeling depressed and down since moving from [place], feeling more depressed over the last 6 months.   She's very concerned about her lack of appetite as she has to force herself to eat three meals daily. 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 PHQ 9 score of 18 today. She denies SI/HI, feeling anxious.   Her last colonoscopy was in 2015, due in 2020 due to polyps. She denies rectal bleeding, abdominal pain, dizziness, chest pain.   Diet currently consists of:  Breakfast: Cereal  Lunch: Skips, crackers Dinner: Sandwich, salad Snacks: None Desserts:None Beverages: Juice, water   Wt Readings from Last 3 Encounters:  04/11/17 131 lb 4 oz (59.5 kg)  09/11/16 143 lb 4 oz (65 kg)  07/27/16 144 lb 12.8 oz (65.7 kg)     Review of Systems  Constitutional: Positive for fatigue. Negative for fever.  Respiratory:       Intermittent exertional dyspnea  Cardiovascular: Negative for chest pain.  Gastrointestinal: Negative for abdominal pain.  Neurological: Negative for dizziness and headaches.    Psychiatric/Behavioral:       See HPI       Past Medical History:  Diagnosis Date  . Altered mental status   . Anxiety and depression   . COPD (chronic obstructive pulmonary disease) ([place])   . Hyperlipidemia   . Hyperparathyroidism ([place])   . Hypertension   . Insomnia   . Vitamin D deficiency      Social History   Socioeconomic History  . Marital status: Married    Spouse name: Not on file  . Number of children: Not on file  . Years of education: Not on file  . Highest education level: Not on file  Occupational History  . Not on file  Social Needs  . Financial resource strain: Not on file  . Food insecurity:    Worry: Not on file    Inability: Not on file  . Transportation needs:    Medical: Not on file    Non-medical: Not on file  Tobacco Use  . Smoking status: Former Smoker    Types: Cigarettes    Last attempt to quit: 1989    Years since quitting: 30.2  . Smokeless tobacco: Never Used  Substance and Sexual Activity  . Alcohol use: No  . Drug use: No  . Sexual activity: Not on file  Lifestyle  . Physical activity:    Days per week: Not on file    Minutes per session: Not on file  .  Stress: Not on file  Relationships  . Social connections:    Talks on phone: Not on file    Gets together: Not on file    Attends religious service: Not on file    Active member of club or organization: Not on file    Attends meetings of clubs or organizations: Not on file    Relationship status: Not on file  . Intimate partner violence:    Fear of current or ex partner: Not on file    Emotionally abused: Not on file    Physically abused: Not on file    Forced sexual activity: Not on file  Other Topics Concern  . Not on file  Social History Narrative   Married.   Lives alone, husband to be joining her from [place].   Retired.    Past Surgical History:  Procedure Laterality Date  . ABDOMINAL HYSTERECTOMY  08/2015  . BREAST BIOPSY Left   . BREAST EXCISIONAL BIOPSY  Right   . COLON RESECTION  1980's  . ENDOMETRIAL BIOPSY  2009   negative  . INCONTINENCE SURGERY  2017  . POLYPECTOMY  2011    Family History  Problem Relation Age of Onset  . Breast cancer Neg Hx   . Hyperparathyroidism Neg Hx     No Known Allergies  Current Outpatient Medications on File Prior to Visit  Medication Sig Dispense Refill  . aspirin EC 81 MG tablet Take 81 mg by mouth daily.    [name] ibuprofen (ADVIL,MOTRIN) 600 MG tablet TAKE 1 TABLET EVERY 6 HOURS AS NEEDED FOR PAIN (MODERATE PAIN)  1   No current facility-administered medications on file prior to visit.     BP (!) 144/92   Pulse 73   Temp 98 F (36.7 C) (Oral)   Ht _0  (1.676 m)   Wt 131 lb 4 oz (59.5 kg)   SpO2 94%   BMI 21.18 kg/m    Objective:   Physical Exam  Constitutional: She appears well-nourished.  Neck: Neck supple.  Cardiovascular: Normal rate and regular rhythm.  Pulmonary/Chest: Effort normal and breath sounds normal.  Abdominal: Soft. Bowel sounds are normal. There is no tenderness.  Skin: Skin is warm and dry.  Psychiatric:  Appears depressed          Assessment & Plan:

## 2017-04-11 NOTE — Assessment & Plan Note (Signed)
Weight loss likely from appetite loss. Appetite loss likely from uncontrolled depression. Will also check labs today to rule out metabolic cause. Will resume Zoloft for treatment of depression.

## 2017-04-11 NOTE — Patient Instructions (Signed)
Stop by the lab prior to leaving today. I will notify you of your results once received.   I sent refills to the pharmacy for all of your medications.  Start sertraline (Zoloft) tablets for depression. Take 1 tablet by mouth once daily.  Please schedule a follow up appointment in 1 month.  It was a pleasure to see you today!

## 2017-04-11 NOTE — Assessment & Plan Note (Signed)
Without inhalers x 2 months, refills provided today. Exam without wheezing or rhonchi.

## 2017-04-11 NOTE — Assessment & Plan Note (Signed)
Above goal slightly, has been without nifedipine for 1-2 months. Refills provided for all BP meds. CMP pending.

## 2017-05-16 ENCOUNTER — Ambulatory Visit (INDEPENDENT_AMBULATORY_CARE_PROVIDER_SITE_OTHER): Payer: Medicare HMO | Admitting: Primary Care

## 2017-05-16 ENCOUNTER — Encounter: Payer: Self-pay | Admitting: Primary Care

## 2017-05-16 VITALS — BP 124/76 | HR 81 | Temp 98.1°F | Ht 66.0 in | Wt 131.5 lb

## 2017-05-16 DIAGNOSIS — R630 Anorexia: Secondary | ICD-10-CM

## 2017-05-16 DIAGNOSIS — R1030 Lower abdominal pain, unspecified: Secondary | ICD-10-CM | POA: Diagnosis not present

## 2017-05-16 DIAGNOSIS — I10 Essential (primary) hypertension: Secondary | ICD-10-CM

## 2017-05-16 DIAGNOSIS — F329 Major depressive disorder, single episode, unspecified: Secondary | ICD-10-CM

## 2017-05-16 DIAGNOSIS — F419 Anxiety disorder, unspecified: Secondary | ICD-10-CM | POA: Diagnosis not present

## 2017-05-16 DIAGNOSIS — F32A Depression, unspecified: Secondary | ICD-10-CM

## 2017-05-16 DIAGNOSIS — J449 Chronic obstructive pulmonary disease, unspecified: Secondary | ICD-10-CM | POA: Diagnosis not present

## 2017-05-16 DIAGNOSIS — E785 Hyperlipidemia, unspecified: Secondary | ICD-10-CM | POA: Diagnosis not present

## 2017-05-16 MED ORDER — FLUTICASONE-SALMETEROL 250-50 MCG/DOSE IN AEPB: 1.0000 | INHALATION_SPRAY | Freq: Two times a day (BID) | RESPIRATORY_TRACT | 3 refills | Status: DC

## 2017-05-16 MED ORDER — DICYCLOMINE HCL 10 MG PO CAPS: ORAL_CAPSULE | ORAL | 0 refills | Status: DC

## 2017-05-16 MED ORDER — LOSARTAN POTASSIUM 100 MG PO TABS
ORAL_TABLET | ORAL | 3 refills | Status: DC
Start: 2017-05-16 — End: 2017-05-22

## 2017-05-16 MED ORDER — ALBUTEROL SULFATE HFA 108 (90 BASE) MCG/ACT IN AERS: 1.0000 | INHALATION_SPRAY | RESPIRATORY_TRACT | 0 refills | Status: DC | PRN

## 2017-05-16 MED ORDER — ATORVASTATIN CALCIUM 10 MG PO TABS: ORAL_TABLET | ORAL | 3 refills | Status: DC

## 2017-05-16 MED ORDER — SERTRALINE HCL 25 MG PO TABS: ORAL_TABLET | ORAL | 3 refills | Status: DC

## 2017-05-16 NOTE — Assessment & Plan Note (Signed)
Improved on nifedipine and losartan, continue same.

## 2017-05-16 NOTE — Assessment & Plan Note (Signed)
Overall improved, weight has stabilized. Continue Zoloft and mirtazapine.

## 2017-05-16 NOTE — Assessment & Plan Note (Signed)
Could not afford Symbicort, using albuterol frequently. Rx for Advair sent to pharmacy for daily use. Explained to use albuterol only PRN. She will call if Advair is too expensive.

## 2017-05-16 NOTE — Assessment & Plan Note (Signed)
Present for 2-3 months, no alarm signs today. Referral placed to GI per patient request.  Rx for Bentyl provided to use PRN as symptoms could very well be secondary to IBS. Also provided so she can avoid Aleve and Vicodin. She will update.  Labs from last visit unremarkable.

## 2017-05-16 NOTE — Assessment & Plan Note (Signed)
Overall improved since re-initiation of Zoloft. Continue both Zoloft and mirtazapine.

## 2017-05-16 NOTE — Patient Instructions (Addendum)
Start the Advair (purple round) inhaler for your COPD symptoms. Inhale 1 puff into the lungs twice daily. Use this inhaler everyday. Please call me if this is too expensive.  Only use the albuterol inhaler as needed for shortness of breath/wheezing.  Continue taking sertraline and mirtazapine for depression and appetite.  Continue taking nifedipine and losartan for high blood pressure.   You will be contacted regarding your referral to GI.  Please let us know if you have not been contacted within one week.   Please schedule a follow up appointment in 6 months.  It was a pleasure to see you today!

## 2017-05-16 NOTE — Assessment & Plan Note (Signed)
Lipids stable, continue atorvastatin.

## 2017-05-16 NOTE — Progress Notes (Signed)
Subjective:    Patient ID: Denise [name], female    DOB: May 24, 1945, 72 y.o.   MRN: [id number]  HPI  Denise [name] is a 72 year old female who presents today for follow up.  1) Anxiety and Depression: Currently managed on Zoloft 25 mg which was re-initiated during her last visit for PHQ 9 score of 18. She is also managed on mirtazapine which was continued.   Since her last visit she's feeling better. She's not feeling as down, doesn't get upset like she was, and is crying less. She denies SI/HI.   Wt Readings from Last 3 Encounters:  05/16/17 131 lb 8 oz (59.6 kg)  04/11/17 131 lb 4 oz (59.5 kg)  09/11/16 143 lb 4 oz (65 kg)     2) COPD: During her last visit she endorsed mild exertional dyspnea, she had been without her Symbicort and albuterol for several months so this was re-initiated.  Since her last visit she's not picked up her Symbicort as it was too expensive. She's using her albuterol 2-3 times daily several time weekly on average. She is out of her albuterol that was given one month ago. She uses her albuterol when she gets short of breath which includes walking up stairs, prolonged activity.   3) Essential Hypertension: Currently managed on nifedipine which was re-initiated during her last visit as she'd been without for 1-2 months, also on Losartan 100 mg . She denies chest pain, dizziness, ankle edema.  BP Readings from Last 3 Encounters:  05/16/17 124/76  04/11/17 (!) 144/92  09/11/16 138/80   4) Abdominal Pain: Located to bilateral lower abdomen which has been present for the past 2-3 months. She's been taking Vicodin intermittently from her daughter's prescription for increased pain with improvement. She is due for repeat colonoscopy in 2020, last one being in [place] in 2015 which showed non cancerous polyps.   She describes her symptoms as cramping which will occur sporadically. She has a large bowel movement every morning, sometimes in the afternoon. She  thinks cramping mostly occurs with movements. She denies rectal bleeding, diarrhea, vomiting. She'll take Aleve with some improvement. Occasional constipation.  Review of Systems  Constitutional: Negative for fever.  Eyes: Negative for visual disturbance.  Respiratory: Positive for shortness of breath. Negative for wheezing.   Cardiovascular: Negative for chest pain.  Gastrointestinal: Positive for abdominal pain and constipation. Negative for blood in stool, diarrhea, nausea and vomiting.  Neurological: Negative for dizziness and headaches.       Past Medical History:  Diagnosis Date  . Altered mental status   . Anxiety and depression   . COPD (chronic obstructive pulmonary disease) ([facility])   . Hyperlipidemia   . Hyperparathyroidism ([facility])   . Hypertension   . Insomnia   . Vitamin D deficiency      Social History   Socioeconomic History  . Marital status: Married    Spouse name: Not on file  . Number of children: Not on file  . Years of education: Not on file  . Highest education level: Not on file  Occupational History  . Not on file  Social Needs  . Financial resource strain: Not on file  . Food insecurity:    Worry: Not on file    Inability: Not on file  . Transportation needs:    Medical: Not on file    Non-medical: Not on file  Tobacco Use  . Smoking status: Former Smoker    Types: Cigarettes  Last attempt to quit: 1989    Years since quitting: 30.3  . Smokeless tobacco: Never Used  Substance and Sexual Activity  . Alcohol use: No  . Drug use: No  . Sexual activity: Not on file  Lifestyle  . Physical activity:    Days per week: Not on file    Minutes per session: Not on file  . Stress: Not on file  Relationships  . Social connections:    Talks on phone: Not on file    Gets together: Not on file    Attends religious service: Not on file    Active member of club or organization: Not on file    Attends meetings of clubs or organizations: Not on file     Relationship status: Not on file  . Intimate partner violence:    Fear of current or ex partner: Not on file    Emotionally abused: Not on file    Physically abused: Not on file    Forced sexual activity: Not on file  Other Topics Concern  . Not on file  Social History Narrative   Married.   Lives alone, husband to be joining her from [place].   Retired.    Past Surgical History:  Procedure Laterality Date  . ABDOMINAL HYSTERECTOMY  08/2015  . BREAST BIOPSY Left   . BREAST EXCISIONAL BIOPSY Right   . COLON RESECTION  1980's  . ENDOMETRIAL BIOPSY  2009   negative  . INCONTINENCE SURGERY  2017  . POLYPECTOMY  2011    Family History  Problem Relation Age of Onset  . Breast cancer Neg Hx   . Hyperparathyroidism Neg Hx     No Known Allergies  Current Outpatient Medications on File Prior to Visit  Medication Sig Dispense Refill  . aspirin EC 81 MG tablet Take 81 mg by mouth daily.    [name] ibuprofen (ADVIL,MOTRIN) 600 MG tablet TAKE 1 TABLET EVERY 6 HOURS AS NEEDED FOR PAIN (MODERATE PAIN)  1  . mirtazapine (REMERON) 15 MG tablet Take 1 tablet by mouth at bedtime for depression and appetite. 90 tablet 1  . NIFEdipine (PROCARDIA-XL/ADALAT CC) 60 MG 24 hr tablet Take 1 tablet by mouth once daily for blood pressure. 90 tablet 0   No current facility-administered medications on file prior to visit.     BP 124/76   Pulse 81   Temp 98.1 F (36.7 C) (Oral)   Ht _0  (1.676 m)   Wt 131 lb 8 oz (59.6 kg)   SpO2 92%   BMI 21.22 kg/m    Objective:   Physical Exam  Constitutional: She is oriented to person, place, and time. She appears well-nourished.  Neck: Neck supple.  Cardiovascular: Normal rate and regular rhythm.  Pulmonary/Chest: Effort normal and breath sounds normal. She has no wheezes.  Abdominal: Soft. Bowel sounds are normal. There is no tenderness.  Neurological: She is alert and oriented to person, place, and time.  Skin: Skin is warm and dry.  Psychiatric:  She has a normal mood and affect.          Assessment & Plan:

## 2017-05-22 ENCOUNTER — Other Ambulatory Visit: Payer: Self-pay | Admitting: Primary Care

## 2017-05-22 DIAGNOSIS — E785 Hyperlipidemia, unspecified: Secondary | ICD-10-CM

## 2017-05-22 DIAGNOSIS — J449 Chronic obstructive pulmonary disease, unspecified: Secondary | ICD-10-CM

## 2017-05-22 DIAGNOSIS — F419 Anxiety disorder, unspecified: Secondary | ICD-10-CM

## 2017-05-22 DIAGNOSIS — I10 Essential (primary) hypertension: Secondary | ICD-10-CM

## 2017-05-22 DIAGNOSIS — F329 Major depressive disorder, single episode, unspecified: Secondary | ICD-10-CM

## 2017-05-22 DIAGNOSIS — F32A Depression, unspecified: Secondary | ICD-10-CM

## 2017-05-22 DIAGNOSIS — R634 Abnormal weight loss: Secondary | ICD-10-CM

## 2017-05-22 MED ORDER — ALBUTEROL SULFATE HFA 108 (90 BASE) MCG/ACT IN AERS: 1.0000 | INHALATION_SPRAY | RESPIRATORY_TRACT | 0 refills | Status: DC | PRN

## 2017-05-22 MED ORDER — MIRTAZAPINE 15 MG PO TABS: ORAL_TABLET | ORAL | 1 refills | Status: DC

## 2017-05-22 MED ORDER — NIFEDIPINE ER 60 MG PO TB24: ORAL_TABLET | ORAL | 1 refills | Status: DC

## 2017-05-22 MED ORDER — ATORVASTATIN CALCIUM 10 MG PO TABS: ORAL_TABLET | ORAL | 1 refills | Status: DC

## 2017-05-22 MED ORDER — LOSARTAN POTASSIUM 100 MG PO TABS: ORAL_TABLET | ORAL | 1 refills | Status: DC

## 2017-05-22 MED ORDER — SERTRALINE HCL 25 MG PO TABS: ORAL_TABLET | ORAL | 1 refills | Status: DC

## 2017-05-22 NOTE — Telephone Encounter (Signed)
Change to mail order

## 2017-05-23 ENCOUNTER — Telehealth: Payer: Self-pay | Admitting: Primary Care

## 2017-05-23 NOTE — Telephone Encounter (Signed)
Patient does not have diabetes. Message left for patient to return my call.

## 2017-05-23 NOTE — Telephone Encounter (Signed)
Copied from [place] [id number]. Topic: Quick Communication - Rx Refill/Question >> May 23, 2017  9:53 AM Neva Seat wrote: AccuCheck Aviva Meter Accu Check Aviva test strips AccuCheck soft click lancets AccuCheck Avia solution  Needing to be filled for pt - per [facility], [place] [address] Phone: [phone] Fax: [phone]

## 2017-05-23 NOTE — Telephone Encounter (Signed)
Patient is requesting diabetic testing supplies- don't see order in chart. Rx for provider review.

## 2017-05-25 NOTE — Telephone Encounter (Signed)
Spoken to patient and discuss with her regarding the supplies below. Since patient does not diabetes and not any kind of medication for diabetes. Will discard this request unless the situation change. Patient agrees.

## 2017-05-30 ENCOUNTER — Encounter: Payer: Self-pay | Admitting: Internal Medicine

## 2017-06-07 ENCOUNTER — Ambulatory Visit: Payer: Medicare HMO | Admitting: Internal Medicine

## 2017-06-07 ENCOUNTER — Other Ambulatory Visit (INDEPENDENT_AMBULATORY_CARE_PROVIDER_SITE_OTHER): Payer: Medicare HMO

## 2017-06-07 ENCOUNTER — Encounter: Payer: Self-pay | Admitting: Internal Medicine

## 2017-06-07 VITALS — BP 130/86 | HR 72 | Ht 66.0 in | Wt 128.1 lb

## 2017-06-07 DIAGNOSIS — F419 Anxiety disorder, unspecified: Secondary | ICD-10-CM

## 2017-06-07 DIAGNOSIS — Z85038 Personal history of other malignant neoplasm of large intestine: Secondary | ICD-10-CM | POA: Diagnosis not present

## 2017-06-07 DIAGNOSIS — R634 Abnormal weight loss: Secondary | ICD-10-CM

## 2017-06-07 DIAGNOSIS — R6881 Early satiety: Secondary | ICD-10-CM

## 2017-06-07 DIAGNOSIS — F329 Major depressive disorder, single episode, unspecified: Secondary | ICD-10-CM | POA: Diagnosis not present

## 2017-06-07 DIAGNOSIS — F32A Depression, unspecified: Secondary | ICD-10-CM

## 2017-06-07 DIAGNOSIS — R1032 Left lower quadrant pain: Secondary | ICD-10-CM

## 2017-06-07 DIAGNOSIS — Z860101 Personal history of adenomatous and serrated colon polyps: Secondary | ICD-10-CM

## 2017-06-07 DIAGNOSIS — Z86010 Personal history of colonic polyps: Secondary | ICD-10-CM

## 2017-06-07 LAB — BASIC METABOLIC PANEL
BUN: 15 mg/dL (ref 6–23)
CHLORIDE: 105 meq/L (ref 96–112)
CO2: 32 mEq/L (ref 19–32)
CREATININE: 1.03 mg/dL (ref 0.40–1.20)
Calcium: 11.4 mg/dL — ABNORMAL HIGH (ref 8.4–10.5)
GFR: 67.75 mL/min (ref >60.00)
Glucose, Bld: 112 mg/dL — ABNORMAL HIGH (ref 70–99)
POTASSIUM: 3.5 meq/L (ref 3.5–5.1)
Sodium: 144 mEq/L (ref 135–145)

## 2017-06-07 MED ORDER — MIRTAZAPINE 15 MG PO TABS: 30.0000 mg | ORAL_TABLET | Freq: Every day | ORAL | 1 refills | Status: DC

## 2017-06-07 NOTE — Patient Instructions (Addendum)
Your provider has requested that you go to the basement level for lab work before leaving today. Press "B" on the elevator. The lab is located at the first door on the left as you exit the elevator.   Dr Gessner wants you to increase your remeron to 30mg at bedtime.   You have been scheduled for a CT scan of the abdomen and pelvis at [facility] ([address]---this is in the same building as [facility]). 06/14/17 at 3:30pm.  You are scheduled on at You should arrive 15 minutes prior to your appointment time for registration. Please follow the written instructions below on the day of your exam:  WARNING: IF YOU ARE ALLERGIC TO IODINE/X-RAY DYE, PLEASE NOTIFY RADIOLOGY IMMEDIATELY AT [phone]! YOU WILL BE GIVEN A 13 HOUR PREMEDICATION PREP.  1) Do not eat or drink anything after 11:30AM (4 hours prior to your test) 2) You have been given 2 bottles of oral contrast to drink. The solution may taste better if refrigerated, but do NOT add ice or any other liquid to this solution. Shake well before drinking.    Drink 1 bottle of contrast @ 1:30PM (2 hours prior to your exam)  Drink 1 bottle of contrast @ 2:30PM (1 hour prior to your exam)  You may take any medications as prescribed with a small amount of water except for the following: Metformin, Glucophage, Glucovance, Avandamet, Riomet, Fortamet, Actoplus Met, Janumet, Glumetza or Metaglip. The above medications must be held the day of the exam AND 48 hours after the exam.  The purpose of you drinking the oral contrast is to aid in the visualization of your intestinal tract. The contrast solution may cause some diarrhea. Before your exam is started, you will be given a small amount of fluid to drink. Depending on your individual set of symptoms, you may also receive an intravenous injection of x-ray contrast/dye. Plan on being at [facility] for 30 minutes or longer, depending on the type of exam you are having  performed.  This test typically takes 30-45 minutes to complete.  If you have any questions regarding your exam or if you need to reschedule, you may call the CT department at [phone] between the hours of 8:00 am and 5:00 pm, Monday-Friday.  ________________________________________________________________________  I appreciate the opportunity to care for you. Carl Gessner , MD, FACG  

## 2017-06-07 NOTE — Progress Notes (Signed)
Denise [name] 72 y.o. 08-23-1945 [id number] Referred by: [name], NP  Assessment & Plan:   Encounter Diagnoses  Name Primary?  Denise [name] Unintentional weight loss Yes  . LLQ pain   . Early satiety   . Hypercalcemia   . Abnormal weight loss   . Anxiety and depression   . History of colon cancer    The patient was evaluated with repeat calcium and PTH level and a be met.  Her calcium level came back at 11.4.  I believe that can explain many perhaps all of her symptoms though the left lower quadrant pain in the setting of a history of colon cancer and the weight loss warrant CT the abdomen and pelvis.  The early satiety does also.  Endoscopic evaluation could be needed as well.  Her PTH level is pending.  It was high last year and she was given a diagnosis of primary hyperparathyroidism and the endocrinologist wanted her to see a surgeon and it looks like she must of seen Dr. Rosendo Gros but no surgery was performed.  I think she should go back to him as the best I can tell is she should be considered for exploratory surgery even though the sestamibi scan did not show a hyperfunctioning focus.  We will see what her CT the abdomen and pelvis shows and consider a colonoscopy or endoscopy of the upper GI tract pending those results and repeat surgical evaluation.  I appreciate the opportunity to care for this patient. CC: [name], NP Dr. 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  She is also complaining of some  intermittent left lower quadrant pain and some difficulty with defecation.  There is no bleeding but she strains.  She has felt depressed and is a little bit better on treatment for that, using mirtazapine but despite that helping her sleep better at 15 mg nightly she says she still has a lack of appetite.  She did see endocrinology last year for an elevated PTH level and calcium level and was diagnosed with primary hyperparathyroidism and was referred to surgery and had a negative sestamibi scan and I saw a note from Dr. Rosendo Gros that he was going to see her back but I cannot tell that anything happened after that and the patient does not recall being offered surgery though it certainly possible.  She did have a tubular adenoma the rectum removed and February 2015, and is due for a repeat colonoscopy in February 2020 approximately. No Known Allergies Current Meds  Medication Sig  . albuterol (PROVENTIL HFA;VENTOLIN HFA) 108 (90 Base) MCG/ACT inhaler Inhale 1-2 puffs into the lungs every 4 (four) hours as needed for wheezing or shortness of breath.  Denise [name] aspirin EC 81 MG tablet Take 81 mg by mouth daily.  Denise [name] atorvastatin (LIPITOR) 10 MG tablet Take 1 tablet by mouth at bedtime for cholesterol.  . dicyclomine (BENTYL) 10 MG capsule Take 1 capsule by mouth three times daily before a meal as needed for stomach cramping.  . Fluticasone-Salmeterol (ADVAIR DISKUS) 250-50 MCG/DOSE AEPB  Inhale 1 puff into the lungs 2 (two) times daily.  Denise [name] ibuprofen (ADVIL,MOTRIN) 600 MG tablet TAKE 1 TABLET EVERY 6 HOURS AS NEEDED FOR PAIN (MODERATE PAIN)  . losartan (COZAAR) 100 MG tablet Take 1 tablet by mouth once daily for blood pressure.  . mirtazapine (REMERON) 15 MG tablet Take 2 tablets (30 mg total) by mouth at bedtime. Take 1 tablet by mouth at bedtime for depression and appetite.  Denise [name] NIFEdipine (PROCARDIA-XL/ADALAT CC) 60 MG 24 hr tablet Take 1 tablet by mouth once daily for blood pressure.  . sertraline (ZOLOFT) 25 MG  tablet Take 1 tablet by mouth once daily for depression.  . [DISCONTINUED] mirtazapine (REMERON) 15 MG tablet Take 1 tablet by mouth at bedtime for depression and appetite.   Past Medical History:  Diagnosis Date  . Altered mental status   . Anxiety and depression   . Colon cancer ([place]) 1988   Resected  . COPD (chronic obstructive pulmonary disease) ([place])   . Diverticulosis   . Hyperlipidemia   . Hypertension   . Insomnia   . Primary hyperparathyroidism ([name])   . Tubular adenoma of rectum 02/23/2013   low grade  . Vitamin D deficiency    Past Surgical History:  Procedure Laterality Date  . ABDOMINAL HYSTERECTOMY  08/2015  . BREAST BIOPSY Left   . BREAST EXCISIONAL BIOPSY Right   . COLON RESECTION  1980's  . COLONOSCOPY    . ENDOMETRIAL BIOPSY  2009   negative  . INCONTINENCE SURGERY  2017   Social History   Social History Narrative   Married. 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   Review of Systems All other review of systems appear negative though she has had a depressed mood and some anxiety.  She has some chronic mild dyspnea.  She did see Dr. Melvyn Novas last year and I think was supposed to follow-up with him but did not do so, as part of a preoperative clearance appointment.  Objective:   Physical Exam _0  130/86   Pulse 72   Ht _1  (1.676 m)   Wt 128 lb 2 oz (58.1 kg)   BMI 20.68 kg/m @  General:  Well-developed, well-nourished and in no acute distress Eyes:  anicteric. ENT:   Mouth and posterior pharynx free of lesions.  Neck:   supple w/o thyromegaly or mass.  Lungs: Clear to auscultation bilaterally. Heart:  S1S2, no rubs, murmurs, gallops. Abdomen:  soft, non-tender, no hepatosplenomegaly, hernia, or mass and BS+.  Lymph:  no cervical or supraclavicular adenopathy. Extremities:   no edema, cyanosis or  clubbing Skin   no rash. Neuro:  A&O x 3.  Psych:  appropriate mood and  Affect.   Data Reviewed:  Primary care notes, endocrinology notes, pulmonary notes, surgery letter, 20 18-20 19 and labs in the computer as well and also previous colonoscopies in [place] 2015 as well as pathology report

## 2017-06-08 ENCOUNTER — Other Ambulatory Visit: Payer: Self-pay | Admitting: Primary Care

## 2017-06-08 ENCOUNTER — Telehealth: Payer: Self-pay

## 2017-06-08 DIAGNOSIS — E210 Primary hyperparathyroidism: Secondary | ICD-10-CM

## 2017-06-08 NOTE — Progress Notes (Signed)
Let her know calcium is higher I think this is most likely causing her problems but will proceed with CT next week This is a known problem and they looked for a parathyroid tumor last year and could not find one  I have rechcecked PTH - pending Am ccing her PCPfor following -  I think she should be referred to Dr. Harlow Asa for surgical consultation - looks like endocrine recommended but cannot see it was done She could see pulmonary again if needed re: COPD but she needs to be seen about exploration of parathyroid to treat the hyperparathyroidism and hypercalcemia and shw ill feel better in many ways most likely (nausea, anorexia, depression)

## 2017-06-08 NOTE — Telephone Encounter (Signed)
Lmtcb for lab results 

## 2017-06-10 ENCOUNTER — Encounter: Payer: Self-pay | Admitting: Internal Medicine

## 2017-06-11 LAB — PTH, INTACT AND CALCIUM
CALCIUM: 11.5 mg/dL — AB (ref 8.6–10.4)
PTH: 123 pg/mL — AB (ref 14–64)

## 2017-06-11 NOTE — Progress Notes (Signed)
PTH still elevated as is ca++ Dr. Rosendo Gros plans to contact the patient about surgical f/u

## 2017-06-13 ENCOUNTER — Other Ambulatory Visit: Payer: Self-pay | Admitting: Primary Care

## 2017-06-13 DIAGNOSIS — R634 Abnormal weight loss: Secondary | ICD-10-CM

## 2017-06-13 DIAGNOSIS — F329 Major depressive disorder, single episode, unspecified: Secondary | ICD-10-CM

## 2017-06-13 DIAGNOSIS — F32A Depression, unspecified: Secondary | ICD-10-CM

## 2017-06-13 DIAGNOSIS — R1030 Lower abdominal pain, unspecified: Secondary | ICD-10-CM

## 2017-06-13 DIAGNOSIS — J449 Chronic obstructive pulmonary disease, unspecified: Secondary | ICD-10-CM

## 2017-06-13 DIAGNOSIS — F419 Anxiety disorder, unspecified: Secondary | ICD-10-CM

## 2017-06-13 NOTE — Telephone Encounter (Signed)
Ok to refill? Electronically refill request for   dicyclomine (BENTYL) 10 MG capsule last prescribed on 05/16/2017  albuterol (PROVENTIL HFA;VENTOLIN HFA) 108 (90 Base) MCG/ACT inhaler Last prescribed on 05/16/2017  Last seen on 05/16/2017

## 2017-06-13 NOTE — Telephone Encounter (Signed)
Does she actually need refills for either medication? Did she pick up the Advair inhaler for COPD? Is she using this? How often is she using albuterol? How often is she using dicyclomine?

## 2017-06-14 ENCOUNTER — Ambulatory Visit (INDEPENDENT_AMBULATORY_CARE_PROVIDER_SITE_OTHER)
Admission: RE | Admit: 2017-06-14 | Discharge: 2017-06-14 | Disposition: A | Discharge status: Home / Self Care | Payer: Medicare HMO | Source: Ambulatory Visit | Attending: Internal Medicine | Admitting: Internal Medicine

## 2017-06-14 DIAGNOSIS — R6881 Early satiety: Secondary | ICD-10-CM

## 2017-06-14 DIAGNOSIS — R634 Abnormal weight loss: Secondary | ICD-10-CM

## 2017-06-14 DIAGNOSIS — N281 Cyst of kidney, acquired: Secondary | ICD-10-CM | POA: Diagnosis not present

## 2017-06-14 DIAGNOSIS — R1032 Left lower quadrant pain: Secondary | ICD-10-CM | POA: Diagnosis not present

## 2017-06-14 MED ORDER — IOPAMIDOL (ISOVUE-300) INJECTION 61%
100.0000 mL | Freq: Once | INTRAVENOUS | Status: AC | PRN
  Administered 2017-06-14: 100 mL via INTRAVENOUS

## 2017-06-14 NOTE — Telephone Encounter (Signed)
Message left for patient to return my call.

## 2017-06-15 NOTE — Telephone Encounter (Signed)
Message left for patient to return my call.

## 2017-06-15 NOTE — Progress Notes (Signed)
Denise [name],  Contact patient  CT shows a possible problem where esophagus meets stomach most often this is not a problem but we need to make sure not a tumor I need to look at this with an EGD  I would like to do Tues or Thurs next week at 0730 if she can  If that does not work we should see if a partner would do it

## 2017-06-19 MED ORDER — MIRTAZAPINE 15 MG PO TABS: ORAL_TABLET | ORAL | 0 refills | Status: DC

## 2017-06-19 NOTE — Telephone Encounter (Signed)
Spoken and notified patient of Denise [name] comments. Patient is taking 2 tablets at night for a while now.

## 2017-06-19 NOTE — Telephone Encounter (Signed)
Is she taking one or two tablets of her Remeron nightly?  Yes, I've been in contact with Dr. Carlean Purl regarding her high calcium. She should be hearing from Dr. Rosendo Gros regarding her parathyroid gland.

## 2017-06-19 NOTE — Telephone Encounter (Signed)
Spoken and notified patient of Denise [name] comments.   Patient stated that she needs a refill of the Bentyl. She has been taking this almost every day since prescribed and this medication helps.  Patient does not a refill of the albuterol inhaler and she is using her Advair daily.  Patient stated that she wanted [name] to be aware that her calcium high  Also patient would like a refill of the Bentryl and Remeron

## 2017-06-19 NOTE — Telephone Encounter (Signed)
Noted.

## 2017-06-19 NOTE — Telephone Encounter (Signed)
Message left for patient to return my call.

## 2017-06-20 ENCOUNTER — Ambulatory Visit (AMBULATORY_SURGERY_CENTER): Payer: Self-pay | Admitting: *Deleted

## 2017-06-20 ENCOUNTER — Other Ambulatory Visit: Payer: Self-pay

## 2017-06-20 VITALS — Ht 66.0 in | Wt 129.0 lb

## 2017-06-20 DIAGNOSIS — R933 Abnormal findings on diagnostic imaging of other parts of digestive tract: Secondary | ICD-10-CM

## 2017-06-20 NOTE — Progress Notes (Signed)
Patient denies any allergies to eggs or soy. Patient denies any problems with anesthesia/sedation. Patient denies any oxygen use at home. Patient denies taking any diet/weight loss medications or blood thinners. EMMI education assisgned to patient on EGD, this was explained and instructions given to patient.

## 2017-06-21 ENCOUNTER — Ambulatory Visit (AMBULATORY_SURGERY_CENTER): Payer: Medicare HMO | Admitting: Internal Medicine

## 2017-06-21 ENCOUNTER — Other Ambulatory Visit: Payer: Self-pay

## 2017-06-21 ENCOUNTER — Encounter: Payer: Self-pay | Admitting: Internal Medicine

## 2017-06-21 VITALS — BP 145/72 | HR 68 | Temp 97.3°F | Resp 24 | Ht 66.0 in | Wt 128.0 lb

## 2017-06-21 DIAGNOSIS — R933 Abnormal findings on diagnostic imaging of other parts of digestive tract: Secondary | ICD-10-CM | POA: Diagnosis present

## 2017-06-21 DIAGNOSIS — K2970 Gastritis, unspecified, without bleeding: Secondary | ICD-10-CM | POA: Diagnosis not present

## 2017-06-21 DIAGNOSIS — K2950 Unspecified chronic gastritis without bleeding: Secondary | ICD-10-CM | POA: Diagnosis not present

## 2017-06-21 MED ORDER — SODIUM CHLORIDE 0.9 % IV SOLN: 500.0000 mL | INTRAVENOUS | Status: DC

## 2017-06-21 MED ORDER — OMEPRAZOLE 20 MG PO CPDR: 20.0000 mg | DELAYED_RELEASE_CAPSULE | Freq: Every day | ORAL | 3 refills | Status: DC

## 2017-06-21 NOTE — Progress Notes (Signed)
Report to PACU, RN, vss, BBS= Clear.  

## 2017-06-21 NOTE — Progress Notes (Signed)
Room air sat-91% on room air- J Monday CRNA made aware pre procedure. Pt has inhaler at bedside.  No changes in medical or surgical hx since PV per pt  Called to room to assist during endoscopic procedure.  Patient ID and intended procedure confirmed with present staff. Received instructions for my participation in the procedure from the performing physician.

## 2017-06-21 NOTE — Patient Instructions (Addendum)
Nothing bad here - no cancer.  There is a hiatal hernia - stomach moves into chest a little - this is what the CT saw. You also have gastritis - inflamed stomach. I took biopsies. Ibuprofen may contribute to this.  Starting omeprazole 20 mg daily - I sent prescription.  Please be sure to see Dr. Rosendo Gros about the hyperparathyroidism and elevated calcium. That is why you feel bad. Call [phone] [facility])  You need a follow up for hyperparathyroidism.YOU HAD AN ENDOSCOPIC PROCEDURE TODAY AT [facility]:   Refer to the procedure report that was given to you for any specific questions about what was found during the examination.  If the procedure report does not answer your questions, please call your gastroenterologist to clarify.  If you requested that your care partner not be given the details of your procedure findings, then the procedure report has been included in a sealed envelope for you to review at your convenience later.  YOU SHOULD EXPECT: Some feelings of bloating in the abdomen. Passage of more gas than usual.  Walking can help get rid of the air that was put into your GI tract during the procedure and reduce the bloating. If you had a lower endoscopy (such as a colonoscopy or flexible sigmoidoscopy) you may notice spotting of blood in your stool or on the toilet paper. If you underwent a bowel prep for your procedure, you may not have a normal bowel movement for a few days.  Please Note:  You might notice some irritation and congestion in your nose or some drainage.  This is from the oxygen used during your procedure.  There is no need for concern and it should clear up in a day or so.  SYMPTOMS TO REPORT IMMEDIATELY:  Following upper endoscopy (EGD)  Vomiting of blood or coffee ground material  New chest pain or pain under the shoulder blades  Painful or persistently difficult swallowing  New shortness of breath  Fever of 100F or  higher  Black, tarry-looking stools  For urgent or emergent issues, a gastroenterologist can be reached at any hour by calling [phone].   DIET:  We do recommend a small meal at first, but then you may proceed to your regular diet.  Drink plenty of fluids but you should avoid alcoholic beverages for 24 hours.  ACTIVITY:  You should plan to take it easy for the rest of today and you should NOT DRIVE or use heavy machinery until tomorrow (because of the sedation medicines used during the test).    FOLLOW UP: Our staff will call the number listed on your records the next business day following your procedure to check on you and address any questions or concerns that you may have regarding the information given to you following your procedure. If we do not reach you, we will leave a message.  However, if you are feeling well and you are not experiencing any problems, there is no need to return our call.  We will assume that you have returned to your regular daily activities without incident.  If any biopsies were taken you will be contacted by phone or by letter within the next 1-3 weeks.  Please call us at [phone] if you have not heard about the biopsies in 3 weeks.    SIGNATURES/CONFIDENTIALITY: You and/or your care partner have signed paperwork which will be entered into your electronic medical record.  These signatures attest to the fact that  that the information above on your After Visit Summary has been reviewed and is understood.  Full responsibility of the confidentiality of this discharge information lies with you and/or your care-partner.  I appreciate the opportunity to care for you.  Gatha Mayer, MD, [facility]

## 2017-06-21 NOTE — Op Note (Signed)
[facility] Patient Name: Denise [name] Procedure Date: 06/21/2017 7:36 AM MRN: [id number] Endoscopist: Gatha Mayer , MD Age: 72 Referring MD:  Date of Birth: July 26, 1945 Gender: Female Account #: [id number] Procedure:                Upper GI endoscopy Indications:              Abnormal CT of the GI tract, Weight loss Medicines:                Propofol per Anesthesia, Monitored Anesthesia Care Procedure:                Pre-Anesthesia Assessment:                           - Prior to the procedure, a History and Physical                            was performed, and patient medications and                            allergies were reviewed. The patient's tolerance of                            previous anesthesia was also reviewed. The risks                            and benefits of the procedure and the sedation                            options and risks were discussed with the patient.                            All questions were answered, and informed consent                            was obtained. Prior Anticoagulants: The patient has                            taken no previous anticoagulant or antiplatelet                            agents. ASA Grade Assessment: III - A patient with                            severe systemic disease. After reviewing the risks                            and benefits, the patient was deemed in                            satisfactory condition to undergo the procedure.                           After obtaining informed consent, the endoscope was  passed under direct vision. Throughout the                            procedure, the patient's blood pressure, pulse, and                            oxygen saturations were monitored continuously. The                            Endoscope was introduced through the mouth, and                            advanced to the second part of duodenum. The upper                             GI endoscopy was accomplished without difficulty.                            The patient tolerated the procedure well. Scope In: Scope Out: Findings:                 Diffuse moderate inflammation characterized by                            erosions was found in the gastric antrum. Biopsies                            were taken with a cold forceps for histology.                            Verification of patient identification for the                            specimen was done. Estimated blood loss was minimal.                           A 2 cm hiatal hernia was present.                           The exam was otherwise without abnormality.                           The cardia and gastric fundus were normal on                            retroflexion. Complications:            No immediate complications. Estimated Blood Loss:     Estimated blood loss was minimal. Impression:               - Gastritis. Biopsied.                           - 2 cm hiatal hernia. Cause of thickening on CT                           -  The examination was otherwise normal. Recommendation:           - Patient has a contact number available for                            emergencies. The signs and symptoms of potential                            delayed complications were discussed with the                            patient. Return to normal activities tomorrow.                            Written discharge instructions were provided to the                            patient.                           - Resume previous diet.                           - Continue present medications.                           - Await pathology results.                           - See Dr. Rosendo Gros re: hyperparathyroidism, start                            omeprazole 20 mg po qd (uses some ibuprofen - may                            be cause of gastritis - could also have H pylori) Gatha Mayer, MD 06/21/2017 7:57:25  AM This report has been signed electronically.

## 2017-06-22 ENCOUNTER — Telehealth: Payer: Self-pay | Admitting: *Deleted

## 2017-06-22 NOTE — Telephone Encounter (Signed)
Left message on f/u call

## 2017-06-22 NOTE — Telephone Encounter (Signed)
Left message on f/u call 

## 2017-06-26 DIAGNOSIS — E210 Primary hyperparathyroidism: Secondary | ICD-10-CM | POA: Diagnosis not present

## 2017-06-28 ENCOUNTER — Other Ambulatory Visit: Payer: Self-pay | Admitting: General Surgery

## 2017-06-28 DIAGNOSIS — E210 Primary hyperparathyroidism: Secondary | ICD-10-CM

## 2017-07-02 NOTE — Progress Notes (Signed)
Stomach biopsies show mild inflammation - no infection or cancer Stay on omeprazole Please confirm she has an appointment to see Dr. Rosendo Gros of [facility] about parathyroid  Return to me prn

## 2017-07-13 ENCOUNTER — Other Ambulatory Visit: Payer: Self-pay | Admitting: Primary Care

## 2017-07-13 DIAGNOSIS — I10 Essential (primary) hypertension: Secondary | ICD-10-CM

## 2017-08-02 ENCOUNTER — Ambulatory Visit
Admission: RE | Admit: 2017-08-02 | Discharge: 2017-08-02 | Disposition: A | Discharge status: Home / Self Care | Payer: Medicare HMO | Source: Ambulatory Visit | Attending: General Surgery | Admitting: General Surgery

## 2017-08-02 DIAGNOSIS — E210 Primary hyperparathyroidism: Secondary | ICD-10-CM

## 2017-08-02 DIAGNOSIS — E041 Nontoxic single thyroid nodule: Secondary | ICD-10-CM | POA: Diagnosis not present

## 2017-08-03 ENCOUNTER — Ambulatory Visit: Payer: Self-pay | Admitting: General Surgery

## 2017-08-03 DIAGNOSIS — E210 Primary hyperparathyroidism: Secondary | ICD-10-CM | POA: Diagnosis not present

## 2017-08-03 NOTE — H&P (Signed)
  History of Present Illness Denise Ok MD; 08/03/2017 4:17 PM) The patient is a 72 year old female who presents with a complaint of primary hyperparathyroidism. Patient is a 72 year old female who comes back in after ultrasound. Patient also. Vitamin D 3 check. This was a normal limits. Ultrasound cannot reveal a parathyroid adenoma. I did review both these tests individually.    ------------------ Patient is a 72 year old female who follows back up secondary to primary hyperparathyroidism. Patient continues to have elevated calcium and PTH level. Patient was recently calcium was 11.5. Her most recent PTH level of 1.3. Her recent vitamin D in 2018 was 80-in the normal range. Patient recently had a sestamibi scan which does not localize a parathyroid adenoma.  Discussed with the patient she's had some minimal constipation. She has had no fractures, aches, mood swings, anoderm manifestations of hypercalcemia.   Allergies Sabino Gasser; 08/03/2017 4:02 PM) No Known Drug Allergies [08/21/2016]: Allergies Reconciled   Medication History Sabino Gasser; 08/03/2017 4:02 PM) Dicyclomine HCl (10MG Capsule, Oral) Active. Fluticasone-Salmeterol (250-50MCG/DOSE Aero Pow Br Act, Inhalation) Active. Ibuprofen (600MG Tablet, Oral) Active. Omeprazole (20MG Capsule DR, Oral) Active. Atorvastatin Calcium (10MG Tablet, Oral) Active. Mirtazapine (15MG Tablet, Oral) Active. NIFEdipine ER (60MG Tablet ER 24HR, Oral) Active. Sertraline HCl (100MG Tablet, Oral) Active. Losartan Potassium (100MG Tablet, Oral) Active. Ventolin HFA (108 (90 Base)MCG/ACT Aerosol Soln, Inhalation) Active. Aspirin (81MG Tablet Chewable, Oral) Active. Albuterol (90MCG/ACT Aerosol Soln, Inhalation) Active. Medications Reconciled  Vitals Sabino Gasser; 08/03/2017 4:03 PM) 08/03/2017 4:03 PM Weight: 126.38 lb Height: 66in Body Surface Area: 1.65 m Body Mass Index: 20.4 kg/m  Temp.: 75F(Oral)   Pulse: 82 (Regular)  BP: 112/72 (Sitting, Left Arm, Standard)       Physical Exam Denise Ok, MD; 08/03/2017 4:19 PM) The physical exam findings are as follows: Note: Constitutional: No acute distress, conversant, appears stated age  Eyes: Anicteric sclerae, moist conjunctiva, no lid lag  Neck: No thyromegaly, trachea midline, no cervical lymphadenopathy  Lungs: Clear to auscultation biilaterally, normal respiratory effot  Cardiovascular: regular rate & rhythm, no murmurs, no peripheal edema, pedal pulses 2+  GI: Soft, no masses or hepatosplenomegaly, non-tender to palpation  MSK: Normal gait, no clubbing cyanosis, edema  Skin: No rashes, palpation reveals normal skin turgor  Psychiatric: Appropriate judgment and insight, oriented to person, place, and time    Assessment & Plan Denise Ok MD; 08/03/2017 4:18 PM) PRIMARY HYPERPARATHYROIDISM (E21.0) Impression: 72 year old female with a history of COPD, anxiety, essential hypertension and primary hyperparathyroidism.  1. I this time the patient has had a negative sestamibi scan in regards to localized a parathyroid adenoma, as well as ultrasound. Patient continues to have elevated PTH levels. Patient's vitamin D levels are within normal limits.  2. This time we'll order a 4D CT scan to localized a parathyroid adenoma. Once this is reviewed we will schedule surgery. 3. I discussed with the patient the surgery in detail. Patient will require minimally invasive parathyroidectomy. 4. I did discuss with the patient the risks and benefits of the procedure to include but not limited to: Infection, bleeding, damage to structures, possible need for further surgery. The patient was understanding and wishes to proceed.

## 2017-08-09 ENCOUNTER — Other Ambulatory Visit: Payer: Self-pay | Admitting: General Surgery

## 2017-08-09 DIAGNOSIS — E210 Primary hyperparathyroidism: Secondary | ICD-10-CM

## 2017-08-23 ENCOUNTER — Ambulatory Visit
Admission: RE | Admit: 2017-08-23 | Discharge: 2017-08-23 | Disposition: A | Discharge status: Home / Self Care | Payer: Medicare HMO | Source: Ambulatory Visit | Attending: General Surgery | Admitting: General Surgery

## 2017-08-23 DIAGNOSIS — E213 Hyperparathyroidism, unspecified: Secondary | ICD-10-CM | POA: Diagnosis not present

## 2017-08-23 DIAGNOSIS — E210 Primary hyperparathyroidism: Secondary | ICD-10-CM

## 2017-08-23 MED ORDER — IOPAMIDOL (ISOVUE-300) INJECTION 61%
75.0000 mL | Freq: Once | INTRAVENOUS | Status: AC | PRN
  Administered 2017-08-23: 75 mL via INTRAVENOUS

## 2017-08-27 DIAGNOSIS — H52223 Regular astigmatism, bilateral: Secondary | ICD-10-CM | POA: Diagnosis not present

## 2017-08-27 DIAGNOSIS — H5203 Hypermetropia, bilateral: Secondary | ICD-10-CM | POA: Diagnosis not present

## 2017-08-27 DIAGNOSIS — Z7984 Long term (current) use of oral hypoglycemic drugs: Secondary | ICD-10-CM | POA: Diagnosis not present

## 2017-08-27 DIAGNOSIS — I10 Essential (primary) hypertension: Secondary | ICD-10-CM | POA: Diagnosis not present

## 2017-08-27 DIAGNOSIS — E119 Type 2 diabetes mellitus without complications: Secondary | ICD-10-CM | POA: Diagnosis not present

## 2017-08-27 DIAGNOSIS — H524 Presbyopia: Secondary | ICD-10-CM | POA: Diagnosis not present

## 2017-08-27 LAB — HM DIABETES EYE EXAM

## 2017-08-28 DIAGNOSIS — H5203 Hypermetropia, bilateral: Secondary | ICD-10-CM | POA: Diagnosis not present

## 2017-09-04 ENCOUNTER — Encounter: Payer: Self-pay | Admitting: Primary Care

## 2017-09-04 DIAGNOSIS — E210 Primary hyperparathyroidism: Secondary | ICD-10-CM | POA: Diagnosis not present

## 2017-09-06 ENCOUNTER — Ambulatory Visit: Payer: Self-pay | Admitting: General Surgery

## 2017-09-06 DIAGNOSIS — E210 Primary hyperparathyroidism: Secondary | ICD-10-CM | POA: Diagnosis not present

## 2017-09-06 NOTE — H&P (Signed)
  History of Present Illness Ralene Ok MD; 09/06/2017 10:23 AM) The patient is a 72 year old female who presents with a complaint of primary hyperparathyroidism. Patient is a 72 year old female who comes back in today after having undergone a 4D CT scan. 4DCT scan was not successful in localizing parathyroid adenoma. Patient underwent 24-hour urine test which was normal.  At this time I discussed with the patient that at this point I would recommend 4 gland exploration. The patient is agreeable to this. -------------------------- Patient is a 72 year old female who comes back in after ultrasound. Patient also. Vitamin D 3 check. This was a normal limits. Ultrasound cannot reveal a parathyroid adenoma. I did review both these tests individually.    ------------------ Patient is a 72 year old female who follows back up secondary to primary hyperparathyroidism. Patient continues to have elevated calcium and PTH level. Patient was recently calcium was 11.5. Her most recent PTH level of 1.3. Her recent vitamin D in 2018 was 80-in the normal range. Patient recently had a sestamibi scan which does not localize a parathyroid adenoma.  Discussed with the patient she's had some minimal constipation. She has had no fractures, aches, mood swings, anoderm manifestations of hypercalcemia.   Allergies (Tanisha A. Owens Shark, [place]; 09/06/2017 9:55 AM) No Known Drug Allergies [08/21/2016]: Allergies Reconciled   Medication History (Tanisha A. Owens Shark, [place]; 09/06/2017 9:55 AM) Dicyclomine HCl (10MG Capsule, Oral) Active. Fluticasone-Salmeterol (250-50MCG/DOSE Aero Pow Br Act, Inhalation) Active. Ibuprofen (600MG Tablet, Oral) Active. Omeprazole (20MG Capsule DR, Oral) Active. Atorvastatin Calcium (10MG Tablet, Oral) Active. Mirtazapine (15MG Tablet, Oral) Active. NIFEdipine ER (60MG Tablet ER 24HR, Oral) Active. Sertraline HCl (100MG Tablet, Oral) Active. Losartan Potassium (100MG  Tablet, Oral) Active. Ventolin HFA (108 (90 Base)MCG/ACT Aerosol Soln, Inhalation) Active. Aspirin (81MG Tablet Chewable, Oral) Active. Albuterol (90MCG/ACT Aerosol Soln, Inhalation) Active. Medications Reconciled  Vitals (Tanisha A. Brown RMA; 09/06/2017 9:55 AM) 09/06/2017 9:54 AM Weight: 125.8 lb Height: 66in Body Surface Area: 1.64 m Body Mass Index: 20.3 kg/m  Temp.: 97.52F  Pulse: 88 (Regular)  BP: 142/84 (Sitting, Left Arm, Standard)       Physical Exam Ralene Ok, MD; 09/06/2017 10:24 AM) The physical exam findings are as follows: Note: Constitutional: No acute distress, conversant, appears stated age  Eyes: Anicteric sclerae, moist conjunctiva, no lid lag  Neck: No thyromegaly, trachea midline, no cervical lymphadenopathy  Lungs: Clear to auscultation biilaterally, normal respiratory effot  Cardiovascular: regular rate & rhythm, no murmurs, no peripheal edema, pedal pulses 2+  GI: Soft, no masses or hepatosplenomegaly, non-tender to palpation  MSK: Normal gait, no clubbing cyanosis, edema  Skin: No rashes, palpation reveals normal skin turgor  Psychiatric: Appropriate judgment and insight, oriented to person, place, and time    Assessment & Plan Ralene Ok MD; 09/06/2017 10:24 AM) PRIMARY HYPERPARATHYROIDISM (E21.0) Impression: 72 year old female with a history of COPD, anxiety, essential hypertension and primary hyperparathyroidism.  1.We will proceed to the operating room for 4 gland exploration of her parathyroid glands. 2. Discussed with her the risk benefits of the procedure to include but not limited to: Infection, bleeding, damage to surrounding structures, possible need for further surgery the patient voiced understanding wishes to proceed.

## 2017-09-16 ENCOUNTER — Other Ambulatory Visit: Payer: Self-pay

## 2017-09-16 ENCOUNTER — Encounter (HOSPITAL_COMMUNITY): Payer: Self-pay | Admitting: *Deleted

## 2017-09-16 ENCOUNTER — Emergency Department (HOSPITAL_COMMUNITY): Payer: Medicare HMO

## 2017-09-16 ENCOUNTER — Inpatient Hospital Stay (HOSPITAL_COMMUNITY)
Admission: EM | Admit: 2017-09-16 | Discharge: 2017-09-24 | DRG: 189 | Disposition: A | Discharge status: Home / Self Care | Payer: Medicare HMO | Attending: Internal Medicine | Admitting: Internal Medicine

## 2017-09-16 DIAGNOSIS — E119 Type 2 diabetes mellitus without complications: Secondary | ICD-10-CM | POA: Diagnosis present

## 2017-09-16 DIAGNOSIS — I2721 Secondary pulmonary arterial hypertension: Secondary | ICD-10-CM | POA: Diagnosis present

## 2017-09-16 DIAGNOSIS — K859 Acute pancreatitis without necrosis or infection, unspecified: Secondary | ICD-10-CM

## 2017-09-16 DIAGNOSIS — R1032 Left lower quadrant pain: Secondary | ICD-10-CM

## 2017-09-16 DIAGNOSIS — I272 Pulmonary hypertension, unspecified: Secondary | ICD-10-CM

## 2017-09-16 DIAGNOSIS — Q8909 Congenital malformations of spleen: Secondary | ICD-10-CM

## 2017-09-16 DIAGNOSIS — E860 Dehydration: Secondary | ICD-10-CM | POA: Diagnosis present

## 2017-09-16 DIAGNOSIS — G4700 Insomnia, unspecified: Secondary | ICD-10-CM | POA: Diagnosis present

## 2017-09-16 DIAGNOSIS — K5903 Drug induced constipation: Secondary | ICD-10-CM | POA: Diagnosis present

## 2017-09-16 DIAGNOSIS — R1013 Epigastric pain: Secondary | ICD-10-CM | POA: Diagnosis not present

## 2017-09-16 DIAGNOSIS — J449 Chronic obstructive pulmonary disease, unspecified: Secondary | ICD-10-CM | POA: Diagnosis not present

## 2017-09-16 DIAGNOSIS — E872 Acidosis: Secondary | ICD-10-CM | POA: Diagnosis present

## 2017-09-16 DIAGNOSIS — R935 Abnormal findings on diagnostic imaging of other abdominal regions, including retroperitoneum: Secondary | ICD-10-CM | POA: Diagnosis not present

## 2017-09-16 DIAGNOSIS — I5033 Acute on chronic diastolic (congestive) heart failure: Secondary | ICD-10-CM | POA: Diagnosis not present

## 2017-09-16 DIAGNOSIS — I2781 Cor pulmonale (chronic): Secondary | ICD-10-CM | POA: Diagnosis present

## 2017-09-16 DIAGNOSIS — Q396 Congenital diverticulum of esophagus: Secondary | ICD-10-CM

## 2017-09-16 DIAGNOSIS — J9601 Acute respiratory failure with hypoxia: Principal | ICD-10-CM | POA: Diagnosis present

## 2017-09-16 DIAGNOSIS — R1030 Lower abdominal pain, unspecified: Secondary | ICD-10-CM | POA: Diagnosis not present

## 2017-09-16 DIAGNOSIS — R64 Cachexia: Secondary | ICD-10-CM | POA: Diagnosis present

## 2017-09-16 DIAGNOSIS — J439 Emphysema, unspecified: Secondary | ICD-10-CM | POA: Diagnosis present

## 2017-09-16 DIAGNOSIS — J441 Chronic obstructive pulmonary disease with (acute) exacerbation: Secondary | ICD-10-CM

## 2017-09-16 DIAGNOSIS — F329 Major depressive disorder, single episode, unspecified: Secondary | ICD-10-CM

## 2017-09-16 DIAGNOSIS — R402140 Coma scale, eyes open, spontaneous, unspecified time: Secondary | ICD-10-CM | POA: Diagnosis present

## 2017-09-16 DIAGNOSIS — K56609 Unspecified intestinal obstruction, unspecified as to partial versus complete obstruction: Secondary | ICD-10-CM | POA: Diagnosis not present

## 2017-09-16 DIAGNOSIS — K56600 Partial intestinal obstruction, unspecified as to cause: Secondary | ICD-10-CM | POA: Diagnosis present

## 2017-09-16 DIAGNOSIS — I5082 Biventricular heart failure: Secondary | ICD-10-CM | POA: Diagnosis present

## 2017-09-16 DIAGNOSIS — E210 Primary hyperparathyroidism: Secondary | ICD-10-CM | POA: Diagnosis present

## 2017-09-16 DIAGNOSIS — K5909 Other constipation: Secondary | ICD-10-CM | POA: Diagnosis not present

## 2017-09-16 DIAGNOSIS — R109 Unspecified abdominal pain: Secondary | ICD-10-CM | POA: Diagnosis not present

## 2017-09-16 DIAGNOSIS — Z681 Body mass index (BMI) 19 or less, adult: Secondary | ICD-10-CM

## 2017-09-16 DIAGNOSIS — E43 Unspecified severe protein-calorie malnutrition: Secondary | ICD-10-CM | POA: Diagnosis present

## 2017-09-16 DIAGNOSIS — I491 Atrial premature depolarization: Secondary | ICD-10-CM | POA: Diagnosis present

## 2017-09-16 DIAGNOSIS — I361 Nonrheumatic tricuspid (valve) insufficiency: Secondary | ICD-10-CM | POA: Diagnosis not present

## 2017-09-16 DIAGNOSIS — R634 Abnormal weight loss: Secondary | ICD-10-CM

## 2017-09-16 DIAGNOSIS — E876 Hypokalemia: Secondary | ICD-10-CM | POA: Diagnosis present

## 2017-09-16 DIAGNOSIS — K8066 Calculus of gallbladder and bile duct with acute and chronic cholecystitis without obstruction: Secondary | ICD-10-CM | POA: Diagnosis present

## 2017-09-16 DIAGNOSIS — I110 Hypertensive heart disease with heart failure: Secondary | ICD-10-CM | POA: Diagnosis present

## 2017-09-16 DIAGNOSIS — K5900 Constipation, unspecified: Secondary | ICD-10-CM | POA: Diagnosis not present

## 2017-09-16 DIAGNOSIS — Z87891 Personal history of nicotine dependence: Secondary | ICD-10-CM

## 2017-09-16 DIAGNOSIS — I5031 Acute diastolic (congestive) heart failure: Secondary | ICD-10-CM | POA: Diagnosis present

## 2017-09-16 DIAGNOSIS — I2729 Other secondary pulmonary hypertension: Secondary | ICD-10-CM | POA: Diagnosis present

## 2017-09-16 DIAGNOSIS — K529 Noninfective gastroenteritis and colitis, unspecified: Secondary | ICD-10-CM | POA: Diagnosis present

## 2017-09-16 DIAGNOSIS — K828 Other specified diseases of gallbladder: Secondary | ICD-10-CM | POA: Diagnosis not present

## 2017-09-16 DIAGNOSIS — R0602 Shortness of breath: Secondary | ICD-10-CM | POA: Diagnosis present

## 2017-09-16 DIAGNOSIS — R933 Abnormal findings on diagnostic imaging of other parts of digestive tract: Secondary | ICD-10-CM | POA: Diagnosis not present

## 2017-09-16 DIAGNOSIS — R339 Retention of urine, unspecified: Secondary | ICD-10-CM | POA: Diagnosis not present

## 2017-09-16 DIAGNOSIS — R188 Other ascites: Secondary | ICD-10-CM | POA: Diagnosis present

## 2017-09-16 DIAGNOSIS — J438 Other emphysema: Secondary | ICD-10-CM | POA: Diagnosis not present

## 2017-09-16 DIAGNOSIS — T40605A Adverse effect of unspecified narcotics, initial encounter: Secondary | ICD-10-CM | POA: Diagnosis present

## 2017-09-16 DIAGNOSIS — N179 Acute kidney failure, unspecified: Secondary | ICD-10-CM | POA: Diagnosis present

## 2017-09-16 DIAGNOSIS — I472 Ventricular tachycardia: Secondary | ICD-10-CM | POA: Diagnosis not present

## 2017-09-16 DIAGNOSIS — K224 Dyskinesia of esophagus: Secondary | ICD-10-CM | POA: Diagnosis present

## 2017-09-16 DIAGNOSIS — R52 Pain, unspecified: Secondary | ICD-10-CM | POA: Diagnosis not present

## 2017-09-16 DIAGNOSIS — F32A Depression, unspecified: Secondary | ICD-10-CM

## 2017-09-16 DIAGNOSIS — Z9049 Acquired absence of other specified parts of digestive tract: Secondary | ICD-10-CM

## 2017-09-16 DIAGNOSIS — Z79899 Other long term (current) drug therapy: Secondary | ICD-10-CM

## 2017-09-16 DIAGNOSIS — R3129 Other microscopic hematuria: Secondary | ICD-10-CM | POA: Diagnosis present

## 2017-09-16 DIAGNOSIS — R0902 Hypoxemia: Secondary | ICD-10-CM | POA: Diagnosis not present

## 2017-09-16 DIAGNOSIS — Z7982 Long term (current) use of aspirin: Secondary | ICD-10-CM

## 2017-09-16 DIAGNOSIS — I10 Essential (primary) hypertension: Secondary | ICD-10-CM | POA: Diagnosis present

## 2017-09-16 DIAGNOSIS — N281 Cyst of kidney, acquired: Secondary | ICD-10-CM | POA: Diagnosis not present

## 2017-09-16 DIAGNOSIS — Z85038 Personal history of other malignant neoplasm of large intestine: Secondary | ICD-10-CM

## 2017-09-16 DIAGNOSIS — F419 Anxiety disorder, unspecified: Secondary | ICD-10-CM | POA: Diagnosis present

## 2017-09-16 DIAGNOSIS — I499 Cardiac arrhythmia, unspecified: Secondary | ICD-10-CM | POA: Diagnosis not present

## 2017-09-16 DIAGNOSIS — E785 Hyperlipidemia, unspecified: Secondary | ICD-10-CM | POA: Diagnosis present

## 2017-09-16 DIAGNOSIS — R1031 Right lower quadrant pain: Secondary | ICD-10-CM

## 2017-09-16 DIAGNOSIS — Z8719 Personal history of other diseases of the digestive system: Secondary | ICD-10-CM

## 2017-09-16 DIAGNOSIS — K8020 Calculus of gallbladder without cholecystitis without obstruction: Secondary | ICD-10-CM | POA: Diagnosis not present

## 2017-09-16 DIAGNOSIS — R402360 Coma scale, best motor response, obeys commands, unspecified time: Secondary | ICD-10-CM | POA: Diagnosis present

## 2017-09-16 DIAGNOSIS — R402250 Coma scale, best verbal response, oriented, unspecified time: Secondary | ICD-10-CM | POA: Diagnosis present

## 2017-09-16 DIAGNOSIS — R1084 Generalized abdominal pain: Secondary | ICD-10-CM | POA: Diagnosis not present

## 2017-09-16 LAB — LIPASE, BLOOD: LIPASE: 23 U/L (ref 11–51)

## 2017-09-16 LAB — COMPREHENSIVE METABOLIC PANEL
ALK PHOS: 111 U/L (ref 38–126)
ALT: 28 U/L (ref 0–44)
ANION GAP: 11 (ref 5–15)
AST: 23 U/L (ref 15–41)
Albumin: 4.2 g/dL (ref 3.5–5.0)
BILIRUBIN TOTAL: 0.7 mg/dL (ref 0.3–1.2)
BUN: 23 mg/dL (ref 8–23)
CALCIUM: 10.6 mg/dL — AB (ref 8.9–10.3)
CO2: 24 mmol/L (ref 22–32)
Chloride: 106 mmol/L (ref 98–111)
Creatinine, Ser: 1.36 mg/dL — ABNORMAL HIGH (ref 0.44–1.00)
GFR calc Af Amer: 44 mL/min — ABNORMAL LOW (ref >60)
GFR, EST NON AFRICAN AMERICAN: 38 mL/min — AB (ref >60)
GLUCOSE: 139 mg/dL — AB (ref 70–99)
Potassium: 3.5 mmol/L (ref 3.5–5.1)
Sodium: 141 mmol/L (ref 135–145)
TOTAL PROTEIN: 6.9 g/dL (ref 6.5–8.1)

## 2017-09-16 LAB — CBC WITH DIFFERENTIAL/PLATELET
Abs Immature Granulocytes: 0 10*3/uL (ref 0.0–0.1)
BASOS PCT: 1 %
Basophils Absolute: 0.1 10*3/uL (ref 0.0–0.1)
EOS ABS: 0.1 10*3/uL (ref 0.0–0.7)
EOS PCT: 1 %
HEMATOCRIT: 44 % (ref 36.0–46.0)
Hemoglobin: 14.1 g/dL (ref 12.0–15.0)
Immature Granulocytes: 0 %
LYMPHS ABS: 0.8 10*3/uL (ref 0.7–4.0)
Lymphocytes Relative: 9 %
MCH: 28.1 pg (ref 26.0–34.0)
MCHC: 32 g/dL (ref 30.0–36.0)
MCV: 87.6 fL (ref 78.0–100.0)
MONOS PCT: 3 %
Monocytes Absolute: 0.2 10*3/uL (ref 0.1–1.0)
Neutro Abs: 7.5 10*3/uL (ref 1.7–7.7)
Neutrophils Relative %: 86 %
Platelets: 212 10*3/uL (ref 150–400)
RBC: 5.02 MIL/uL (ref 3.87–5.11)
RDW: 15.2 % (ref 11.5–15.5)
WBC: 8.7 10*3/uL (ref 4.0–10.5)

## 2017-09-16 LAB — BRAIN NATRIURETIC PEPTIDE: B Natriuretic Peptide: 310.4 pg/mL — ABNORMAL HIGH (ref 0.0–100.0)

## 2017-09-16 LAB — I-STAT TROPONIN, ED: Troponin i, poc: 0.01 ng/mL (ref 0.00–0.08)

## 2017-09-16 MED ORDER — IPRATROPIUM-ALBUTEROL 0.5-2.5 (3) MG/3ML IN SOLN
3.0000 mL | Freq: Once | RESPIRATORY_TRACT | Status: AC
  Administered 2017-09-16: 3 mL via RESPIRATORY_TRACT
  Filled 2017-09-16: qty 3

## 2017-09-16 MED ORDER — SODIUM CHLORIDE 0.9 % IV BOLUS: 500.0000 mL | Freq: Once | INTRAVENOUS | Status: DC

## 2017-09-16 MED ORDER — HYDROMORPHONE HCL 1 MG/ML IJ SOLN
1.0000 mg | Freq: Once | INTRAMUSCULAR | Status: AC
  Administered 2017-09-16: 1 mg via INTRAVENOUS
  Filled 2017-09-16: qty 1

## 2017-09-16 MED ORDER — MORPHINE SULFATE (PF) 4 MG/ML IV SOLN
4.0000 mg | Freq: Once | INTRAVENOUS | Status: AC
  Administered 2017-09-16: 4 mg via INTRAVENOUS
  Filled 2017-09-16: qty 1

## 2017-09-16 MED ORDER — FUROSEMIDE 10 MG/ML IJ SOLN
40.0000 mg | Freq: Once | INTRAMUSCULAR | Status: AC
  Administered 2017-09-16: 40 mg via INTRAVENOUS
  Filled 2017-09-16: qty 4

## 2017-09-16 NOTE — ED Notes (Signed)
The pt has swelling to the rt  Knee for 2 weeks

## 2017-09-16 NOTE — ED Triage Notes (Signed)
The pt arrived by [facility] from home  abd pain for several days  She has vomited several times in the past few days last bm was yesterday.  Low sats when ems arrived  Nasal 02 at present cbg 139 hx colon cancer.  Swollen lower extremities  For 2 weeks   Fentanyl 100 mcg on the way here iv  ems

## 2017-09-16 NOTE — ED Provider Notes (Signed)
Emergency Department Provider Note   I have reviewed the triage vital signs and the nursing notes.   HISTORY  Chief Complaint Abdominal Pain and Shortness of Breath   HPI Denise [name] is a 72 y.o. female with PMH of AMS, COPD, DM, HLD, HTN, and primary hyperparathyroidism to the emergency department with several days of epigastric abdominal pain radiating to the back with vomiting and shortness of breath.  She states she has history of COPD and has been felt short of breath for the past week.  She is been administering albuterol inhaler but this isn't helping as much as earlier in the week. Patient has a remote history of colon cancer with no h/o SBO. Denies any CP. No history of AMI. No modifying factors.   Past Medical History:  Diagnosis Date  . Altered mental status   . Anxiety and depression   . Colon cancer ([place]) 1988   Resected  . COPD (chronic obstructive pulmonary disease) ([place])   . Diabetes mellitus without complication ([name])    diet controlled- no meds. per pt  . Diverticulosis   . Hyperlipidemia   . Hypertension   . Insomnia   . Primary hyperparathyroidism ([place])   . Tubular adenoma of rectum 02/23/2013   low grade  . Vitamin D deficiency     Patient Active Problem List   Diagnosis Date Noted  . Lower abdominal pain 05/16/2017  . Hyperparathyroidism, primary ([place]) 07/27/2016  . Loss of appetite 06/02/2016  . COPD GOLD II  11/22/2015  . Vitamin D deficiency 11/22/2015  . Anxiety and depression 11/22/2015  . Insomnia 11/22/2015  . Altered mental status 11/22/2015  . Hyperlipidemia 11/22/2015  . Essential hypertension 11/22/2015  . History of adenomatous polyp of colon 02/10/2013  . History of colon cancer 06/11/1986    Past Surgical History:  Procedure Laterality Date  . ABDOMINAL HYSTERECTOMY  08/2015  . BREAST BIOPSY Left   . BREAST EXCISIONAL BIOPSY Right   . COLON RESECTION  1980's  . COLONOSCOPY    . ENDOMETRIAL BIOPSY  2009   negative  . INCONTINENCE SURGERY  2017    Allergies Patient has no known allergies.  Family History  Problem Relation Age of Onset  . Ovarian cancer Mother   . Breast cancer Neg Hx   . Hyperparathyroidism Neg Hx   . Colon cancer Neg Hx   . Esophageal cancer Neg Hx   . Stomach cancer Neg Hx     Social History Social History   Tobacco Use  . Smoking status: Former Smoker    Types: Cigarettes    Last attempt to quit: 1989    Years since quitting: 30.7  . Smokeless tobacco: Never Used  Substance Use Topics  . Alcohol use: No  . Drug use: No    Review of Systems  Constitutional: No fever/chills Eyes: No visual changes. ENT: No sore throat. Cardiovascular: Denies chest pain. Respiratory: Positive shortness of breath. Gastrointestinal: Positive abdominal pain. Positive nausea and vomiting.  No diarrhea.  No constipation. Genitourinary: Negative for dysuria. Musculoskeletal: Negative for back pain. Skin: Negative for rash. Neurological: Negative for headaches, focal weakness or numbness.  10-point ROS otherwise negative.  ____________________________________________   PHYSICAL EXAM:  VITAL SIGNS: ED Triage Vitals [09/16/17 1948]  Enc Vitals Group     BP (!) 152/95     Pulse Rate 81     Resp 16     Temp 97.7 F (36.5 C)     Temp Source Oral  SpO2 94 %     Weight 125 lb (56.7 kg)     Height _0  (1.676 m)     Pain Score 8   Constitutional: Alert and oriented. Well appearing and in no acute distress. Eyes: Conjunctivae are normal.  Head: Atraumatic. Nose: No congestion/rhinnorhea. Mouth/Throat: Mucous membranes are moist.  Neck: No stridor. Cardiovascular: Normal rate, regular rhythm. Good peripheral circulation. Grossly normal heart sounds.   Respiratory: Normal respiratory effort.  No retractions. Lungs with bilateral end-expiratory wheezing.  Gastrointestinal: Soft with diffuse tenderness. No rebound or guarding. Worse in the epigastric region.    Musculoskeletal: No lower extremity tenderness nor edema. No gross deformities of extremities. Neurologic:  Normal speech and language. No gross focal neurologic deficits are appreciated.  Skin:  Skin is warm, dry and intact. No rash noted.  ____________________________________________   LABS (all labs ordered are listed, but only abnormal results are displayed)  Labs Reviewed  COMPREHENSIVE METABOLIC PANEL - Abnormal; Notable for the following components:      Result Value   Glucose, Bld 139 (*)    Creatinine, Ser 1.36 (*)    Calcium 10.6 (*)    GFR calc non Af Amer 38 (*)    GFR calc Af Amer 44 (*)    All other components within normal limits  BRAIN NATRIURETIC PEPTIDE - Abnormal; Notable for the following components:   B Natriuretic Peptide 310.4 (*)    All other components within normal limits  LIPASE, BLOOD  CBC WITH DIFFERENTIAL/PLATELET  URINALYSIS, ROUTINE W REFLEX MICROSCOPIC  I-STAT TROPONIN, ED   ____________________________________________  EKG   EKG Interpretation  Date/Time:  Sunday September 16 2017 19:55:40 EDT Ventricular Rate:  79 PR Interval:    QRS Duration: 82 QT Interval:  372 QTC Calculation: 427 R Axis:   17 Text Interpretation:  Sinus rhythm Atrial premature complexes LVH with secondary repolarization abnormality No STEMI.  Confirmed by Nanda Quinton [phone]) on 09/16/2017 8:09:56 PM       ____________________________________________  RADIOLOGY  Dg Abdomen Acute W/chest  Result Date: 09/16/2017 CLINICAL DATA:  Patient with abdominal pain. EXAM: DG ABDOMEN ACUTE W/ 1V CHEST COMPARISON:  Chest radiograph 06/02/2016 FINDINGS: Monitoring leads overlie the patient. Cardiac contours upper limits of normal. Aortic atherosclerosis. Bilateral interstitial pulmonary opacities. Small left pleural effusion. Gas is demonstrated within nondilated loops of bowel throughout the abdomen. Stool throughout the colon. Lumbar spine degenerative changes.  IMPRESSION: Cardiomegaly with pulmonary vascular redistribution and mild interstitial edema. Small left effusion. Nonobstructed bowel gas pattern. Stool throughout the colon. Electronically Signed   By: Lovey Newcomer M.D.   On: 09/16/2017 21:13    ____________________________________________   PROCEDURES  Procedure(s) performed:   Procedures  None ____________________________________________   INITIAL IMPRESSION / ASSESSMENT AND PLAN / ED COURSE  Pertinent labs & imaging results that were available during my care of the patient were reviewed by me and considered in my medical decision making (see chart for details).  Patient presents to the emergency department with several days of abdominal pain with vomiting.  She has diffuse tenderness.  She has a remote history of colon cancer status post resection.  Considering SBO.  EKG has some nonspecific ST changes.  She is not having chest pain.  Low suspicion for atypical ACS.  Plan to obtain troponin and treat pain in addition to plain film of the belly and chest prior to CT to evaluate for airspace disease and r/o perforation.   11:11 PM Plain film reviewed with no  acute obstructive pattern in the abdomen.  Patient does have mild pulmonary edema.  Getting Lasix for this as this may be contributing to her shortness of breath.  Plan to also give additional DuoNeb.  Patient continues to have epigastric pain.  Urinary retention discovered with bladder scan of greater than 800.  Plan to insert Foley catheter.  Continues to not have chest pain.  Troponin is negative.  Labs reviewed with no acute findings.  CT scan is pending of the abdomen and pelvis.   Care transferred to Dr. Leonides Schanz who will follow CT.  ____________________________________________  FINAL CLINICAL IMPRESSION(S) / ED DIAGNOSES  Final diagnoses:  Shortness of breath  Hypoxia  Epigastric pain  Urinary retention     MEDICATIONS GIVEN DURING THIS VISIT:  Medications  morphine  4 MG/ML injection 4 mg (4 mg Intravenous Given 09/16/17 2017)  ipratropium-albuterol (DUONEB) 0.5-2.5 (3) MG/3ML nebulizer solution 3 mL (3 mLs Nebulization Given 09/16/17 2017)  morphine 4 MG/ML injection 4 mg (4 mg Intravenous Given 09/16/17 2115)  HYDROmorphone (DILAUDID) injection 1 mg (1 mg Intravenous Given 09/16/17 2308)  ipratropium-albuterol (DUONEB) 0.5-2.5 (3) MG/3ML nebulizer solution 3 mL (3 mLs Nebulization Given 09/16/17 2308)  furosemide (LASIX) injection 40 mg (40 mg Intravenous Given 09/16/17 2308)  iohexol (OMNIPAQUE) 300 MG/ML solution 80 mL (100 mLs Intravenous Contrast Given 09/17/17 0100)    Note:  This document was prepared using Dragon voice recognition software and may include unintentional dictation errors.  Nanda Quinton, MD Emergency Medicine    Long, [facility], MD 09/17/17 [phone]

## 2017-09-17 ENCOUNTER — Inpatient Hospital Stay (HOSPITAL_COMMUNITY): Payer: Medicare HMO

## 2017-09-17 ENCOUNTER — Encounter (HOSPITAL_COMMUNITY): Payer: Self-pay | Admitting: Internal Medicine

## 2017-09-17 ENCOUNTER — Observation Stay (HOSPITAL_COMMUNITY): Payer: Medicare HMO

## 2017-09-17 ENCOUNTER — Emergency Department (HOSPITAL_COMMUNITY): Payer: Medicare HMO

## 2017-09-17 ENCOUNTER — Other Ambulatory Visit: Payer: Self-pay

## 2017-09-17 ENCOUNTER — Encounter (HOSPITAL_COMMUNITY): Payer: Self-pay | Admitting: Certified Registered Nurse Anesthetist

## 2017-09-17 DIAGNOSIS — J439 Emphysema, unspecified: Secondary | ICD-10-CM | POA: Diagnosis present

## 2017-09-17 DIAGNOSIS — R1013 Epigastric pain: Secondary | ICD-10-CM | POA: Diagnosis not present

## 2017-09-17 DIAGNOSIS — K5900 Constipation, unspecified: Secondary | ICD-10-CM | POA: Diagnosis not present

## 2017-09-17 DIAGNOSIS — K56600 Partial intestinal obstruction, unspecified as to cause: Secondary | ICD-10-CM | POA: Diagnosis present

## 2017-09-17 DIAGNOSIS — E872 Acidosis: Secondary | ICD-10-CM | POA: Diagnosis present

## 2017-09-17 DIAGNOSIS — I2729 Other secondary pulmonary hypertension: Secondary | ICD-10-CM | POA: Diagnosis present

## 2017-09-17 DIAGNOSIS — K8020 Calculus of gallbladder without cholecystitis without obstruction: Secondary | ICD-10-CM | POA: Diagnosis not present

## 2017-09-17 DIAGNOSIS — I361 Nonrheumatic tricuspid (valve) insufficiency: Secondary | ICD-10-CM | POA: Diagnosis not present

## 2017-09-17 DIAGNOSIS — E876 Hypokalemia: Secondary | ICD-10-CM | POA: Diagnosis not present

## 2017-09-17 DIAGNOSIS — I2721 Secondary pulmonary arterial hypertension: Secondary | ICD-10-CM | POA: Diagnosis present

## 2017-09-17 DIAGNOSIS — R188 Other ascites: Secondary | ICD-10-CM | POA: Diagnosis present

## 2017-09-17 DIAGNOSIS — R1030 Lower abdominal pain, unspecified: Secondary | ICD-10-CM | POA: Diagnosis not present

## 2017-09-17 DIAGNOSIS — Q396 Congenital diverticulum of esophagus: Secondary | ICD-10-CM | POA: Diagnosis not present

## 2017-09-17 DIAGNOSIS — K56609 Unspecified intestinal obstruction, unspecified as to partial versus complete obstruction: Secondary | ICD-10-CM | POA: Diagnosis not present

## 2017-09-17 DIAGNOSIS — E43 Unspecified severe protein-calorie malnutrition: Secondary | ICD-10-CM | POA: Diagnosis present

## 2017-09-17 DIAGNOSIS — E210 Primary hyperparathyroidism: Secondary | ICD-10-CM | POA: Diagnosis not present

## 2017-09-17 DIAGNOSIS — I272 Pulmonary hypertension, unspecified: Secondary | ICD-10-CM | POA: Diagnosis not present

## 2017-09-17 DIAGNOSIS — Z681 Body mass index (BMI) 19 or less, adult: Secondary | ICD-10-CM | POA: Diagnosis not present

## 2017-09-17 DIAGNOSIS — I5082 Biventricular heart failure: Secondary | ICD-10-CM | POA: Diagnosis present

## 2017-09-17 DIAGNOSIS — I10 Essential (primary) hypertension: Secondary | ICD-10-CM | POA: Diagnosis not present

## 2017-09-17 DIAGNOSIS — J9601 Acute respiratory failure with hypoxia: Secondary | ICD-10-CM | POA: Diagnosis present

## 2017-09-17 DIAGNOSIS — E119 Type 2 diabetes mellitus without complications: Secondary | ICD-10-CM | POA: Diagnosis present

## 2017-09-17 DIAGNOSIS — I5031 Acute diastolic (congestive) heart failure: Secondary | ICD-10-CM | POA: Diagnosis present

## 2017-09-17 DIAGNOSIS — I472 Ventricular tachycardia: Secondary | ICD-10-CM | POA: Diagnosis not present

## 2017-09-17 DIAGNOSIS — J449 Chronic obstructive pulmonary disease, unspecified: Secondary | ICD-10-CM | POA: Diagnosis not present

## 2017-09-17 DIAGNOSIS — F419 Anxiety disorder, unspecified: Secondary | ICD-10-CM | POA: Diagnosis present

## 2017-09-17 DIAGNOSIS — E860 Dehydration: Secondary | ICD-10-CM | POA: Diagnosis present

## 2017-09-17 DIAGNOSIS — R0602 Shortness of breath: Secondary | ICD-10-CM | POA: Diagnosis present

## 2017-09-17 DIAGNOSIS — N179 Acute kidney failure, unspecified: Secondary | ICD-10-CM | POA: Diagnosis present

## 2017-09-17 DIAGNOSIS — I5033 Acute on chronic diastolic (congestive) heart failure: Secondary | ICD-10-CM | POA: Diagnosis not present

## 2017-09-17 DIAGNOSIS — K224 Dyskinesia of esophagus: Secondary | ICD-10-CM | POA: Diagnosis present

## 2017-09-17 DIAGNOSIS — R64 Cachexia: Secondary | ICD-10-CM | POA: Diagnosis present

## 2017-09-17 DIAGNOSIS — R933 Abnormal findings on diagnostic imaging of other parts of digestive tract: Secondary | ICD-10-CM | POA: Diagnosis not present

## 2017-09-17 DIAGNOSIS — J438 Other emphysema: Secondary | ICD-10-CM | POA: Diagnosis not present

## 2017-09-17 DIAGNOSIS — R935 Abnormal findings on diagnostic imaging of other abdominal regions, including retroperitoneum: Secondary | ICD-10-CM | POA: Diagnosis not present

## 2017-09-17 DIAGNOSIS — Q8909 Congenital malformations of spleen: Secondary | ICD-10-CM | POA: Diagnosis not present

## 2017-09-17 DIAGNOSIS — I110 Hypertensive heart disease with heart failure: Secondary | ICD-10-CM | POA: Diagnosis present

## 2017-09-17 DIAGNOSIS — K5903 Drug induced constipation: Secondary | ICD-10-CM | POA: Diagnosis present

## 2017-09-17 DIAGNOSIS — I2781 Cor pulmonale (chronic): Secondary | ICD-10-CM | POA: Diagnosis present

## 2017-09-17 DIAGNOSIS — K8066 Calculus of gallbladder and bile duct with acute and chronic cholecystitis without obstruction: Secondary | ICD-10-CM | POA: Diagnosis present

## 2017-09-17 DIAGNOSIS — K5909 Other constipation: Secondary | ICD-10-CM | POA: Diagnosis not present

## 2017-09-17 HISTORY — DX: Unspecified severe protein-calorie malnutrition: E43

## 2017-09-17 LAB — URINALYSIS, ROUTINE W REFLEX MICROSCOPIC
Bacteria, UA: NONE SEEN
Bilirubin Urine: NEGATIVE
GLUCOSE, UA: 50 mg/dL — AB
KETONES UR: 5 mg/dL — AB
Leukocytes, UA: NEGATIVE
NITRITE: NEGATIVE
PH: 6 (ref 5.0–8.0)
Protein, ur: NEGATIVE mg/dL
Specific Gravity, Urine: 1.01 (ref 1.005–1.030)

## 2017-09-17 LAB — CBC WITH DIFFERENTIAL/PLATELET
ABS IMMATURE GRANULOCYTES: 0 10*3/uL (ref 0.0–0.1)
Basophils Absolute: 0 10*3/uL (ref 0.0–0.1)
Basophils Relative: 1 %
Eosinophils Absolute: 0 10*3/uL (ref 0.0–0.7)
Eosinophils Relative: 0 %
HEMATOCRIT: 44.1 % (ref 36.0–46.0)
HEMOGLOBIN: 14.5 g/dL (ref 12.0–15.0)
IMMATURE GRANULOCYTES: 0 %
LYMPHS ABS: 0.4 10*3/uL — AB (ref 0.7–4.0)
LYMPHS PCT: 6 %
MCH: 28 pg (ref 26.0–34.0)
MCHC: 32.9 g/dL (ref 30.0–36.0)
MCV: 85.3 fL (ref 78.0–100.0)
Monocytes Absolute: 0.3 10*3/uL (ref 0.1–1.0)
Monocytes Relative: 4 %
NEUTROS PCT: 89 %
Neutro Abs: 6.3 10*3/uL (ref 1.7–7.7)
Platelets: 227 10*3/uL (ref 150–400)
RBC: 5.17 MIL/uL — AB (ref 3.87–5.11)
RDW: 15 % (ref 11.5–15.5)
WBC: 7 10*3/uL (ref 4.0–10.5)

## 2017-09-17 LAB — BASIC METABOLIC PANEL
Anion gap: 12 (ref 5–15)
BUN: 18 mg/dL (ref 8–23)
CALCIUM: 10.4 mg/dL — AB (ref 8.9–10.3)
CO2: 27 mmol/L (ref 22–32)
CREATININE: 1 mg/dL (ref 0.44–1.00)
Chloride: 104 mmol/L (ref 98–111)
GFR calc non Af Amer: 55 mL/min — ABNORMAL LOW (ref >60)
Glucose, Bld: 180 mg/dL — ABNORMAL HIGH (ref 70–99)
Potassium: 3.6 mmol/L (ref 3.5–5.1)
Sodium: 143 mmol/L (ref 135–145)

## 2017-09-17 LAB — HEPATIC FUNCTION PANEL
ALK PHOS: 116 U/L (ref 38–126)
ALT: 31 U/L (ref 0–44)
AST: 25 U/L (ref 15–41)
Albumin: 4.2 g/dL (ref 3.5–5.0)
Bilirubin, Direct: 0.2 mg/dL (ref 0.0–0.2)
Indirect Bilirubin: 0.8 mg/dL (ref 0.3–0.9)
Total Bilirubin: 1 mg/dL (ref 0.3–1.2)
Total Protein: 7.1 g/dL (ref 6.5–8.1)

## 2017-09-17 LAB — TROPONIN I: Troponin I: <0.03 ng/mL (ref <0.03)

## 2017-09-17 LAB — PROCALCITONIN

## 2017-09-17 LAB — GLUCOSE, CAPILLARY
GLUCOSE-CAPILLARY: 168 mg/dL — AB (ref 70–99)
GLUCOSE-CAPILLARY: 150 mg/dL — AB (ref 70–99)
GLUCOSE-CAPILLARY: 138 mg/dL — AB (ref 70–99)
GLUCOSE-CAPILLARY: 142 mg/dL — AB (ref 70–99)

## 2017-09-17 LAB — D-DIMER, QUANTITATIVE: D-Dimer, Quant: 0.9 ug/mL-FEU — ABNORMAL HIGH (ref 0.00–0.50)

## 2017-09-17 MED ORDER — ALBUTEROL SULFATE (2.5 MG/3ML) 0.083% IN NEBU: 2.5000 mg | INHALATION_SOLUTION | RESPIRATORY_TRACT | Status: DC

## 2017-09-17 MED ORDER — HYDROCODONE-ACETAMINOPHEN 5-325 MG PO TABS
1.0000 | ORAL_TABLET | Freq: Once | ORAL | Status: AC
  Administered 2017-09-17: 2 via ORAL
  Filled 2017-09-17: qty 2

## 2017-09-17 MED ORDER — BUDESONIDE 0.25 MG/2ML IN SUSP
0.2500 mg | Freq: Two times a day (BID) | RESPIRATORY_TRACT | Status: DC
  Administered 2017-09-17 – 2017-09-20 (×7): 0.25 mg via RESPIRATORY_TRACT
  Filled 2017-09-17 (×7): qty 2

## 2017-09-17 MED ORDER — HYDROCODONE-ACETAMINOPHEN 5-325 MG PO TABS
1.0000 | ORAL_TABLET | ORAL | Status: DC | PRN
  Administered 2017-09-17 – 2017-09-18 (×2): 1 via ORAL
  Filled 2017-09-17 (×3): qty 1

## 2017-09-17 MED ORDER — LEVOFLOXACIN IN D5W 750 MG/150ML IV SOLN
750.0000 mg | INTRAVENOUS | Status: DC
  Administered 2017-09-17: 750 mg via INTRAVENOUS
  Filled 2017-09-17: qty 150

## 2017-09-17 MED ORDER — ORAL CARE MOUTH RINSE
15.0000 mL | Freq: Two times a day (BID) | OROMUCOSAL | Status: DC
  Administered 2017-09-17 – 2017-09-23 (×12): 15 mL via OROMUCOSAL

## 2017-09-17 MED ORDER — GLUCERNA SHAKE PO LIQD
237.0000 mL | Freq: Three times a day (TID) | ORAL | Status: DC
  Administered 2017-09-17 – 2017-09-24 (×8): 237 mL via ORAL

## 2017-09-17 MED ORDER — FAMOTIDINE IN NACL 20-0.9 MG/50ML-% IV SOLN
20.0000 mg | Freq: Two times a day (BID) | INTRAVENOUS | Status: DC
  Administered 2017-09-17: 20 mg via INTRAVENOUS
  Filled 2017-09-17: qty 50

## 2017-09-17 MED ORDER — FENTANYL CITRATE (PF) 100 MCG/2ML IJ SOLN: 25.0000 ug | INTRAMUSCULAR | Status: DC | PRN

## 2017-09-17 MED ORDER — CHLORHEXIDINE GLUCONATE 0.12 % MT SOLN
15.0000 mL | Freq: Two times a day (BID) | OROMUCOSAL | Status: DC
  Administered 2017-09-17 – 2017-09-24 (×10): 15 mL via OROMUCOSAL
  Filled 2017-09-17 (×14): qty 15

## 2017-09-17 MED ORDER — PIPERACILLIN-TAZOBACTAM 3.375 G IVPB
3.3750 g | Freq: Three times a day (TID) | INTRAVENOUS | Status: DC
  Administered 2017-09-17: 3.375 g via INTRAVENOUS
  Filled 2017-09-17 (×2): qty 50

## 2017-09-17 MED ORDER — ONDANSETRON HCL 4 MG/2ML IJ SOLN
4.0000 mg | Freq: Four times a day (QID) | INTRAMUSCULAR | Status: DC | PRN
  Administered 2017-09-17: 4 mg via INTRAVENOUS
  Filled 2017-09-17: qty 2

## 2017-09-17 MED ORDER — ACETAMINOPHEN 650 MG RE SUPP: 650.0000 mg | Freq: Four times a day (QID) | RECTAL | Status: DC | PRN

## 2017-09-17 MED ORDER — INSULIN ASPART 100 UNIT/ML ~~LOC~~ SOLN
0.0000 [IU] | SUBCUTANEOUS | Status: DC
  Administered 2017-09-17 – 2017-09-19 (×7): 1 [IU] via SUBCUTANEOUS
  Administered 2017-09-19: 2 [IU] via SUBCUTANEOUS

## 2017-09-17 MED ORDER — TECHNETIUM TO 99M ALBUMIN AGGREGATED
4.2000 | Freq: Once | INTRAVENOUS | Status: AC | PRN
  Administered 2017-09-17: 4.2 via INTRAVENOUS

## 2017-09-17 MED ORDER — FENTANYL CITRATE (PF) 100 MCG/2ML IJ SOLN
50.0000 ug | Freq: Once | INTRAMUSCULAR | Status: AC
  Administered 2017-09-17: 50 ug via INTRAVENOUS
  Filled 2017-09-17: qty 2

## 2017-09-17 MED ORDER — ONDANSETRON HCL 4 MG PO TABS
4.0000 mg | ORAL_TABLET | Freq: Four times a day (QID) | ORAL | Status: DC | PRN
  Administered 2017-09-18 – 2017-09-21 (×2): 4 mg via ORAL
  Filled 2017-09-17 (×2): qty 1

## 2017-09-17 MED ORDER — HYDRALAZINE HCL 20 MG/ML IJ SOLN: 10.0000 mg | INTRAMUSCULAR | Status: DC | PRN

## 2017-09-17 MED ORDER — ADULT MULTIVITAMIN W/MINERALS CH
1.0000 | ORAL_TABLET | Freq: Every day | ORAL | Status: DC
  Administered 2017-09-17 – 2017-09-24 (×8): 1 via ORAL
  Filled 2017-09-17 (×9): qty 1

## 2017-09-17 MED ORDER — HYDROCODONE-ACETAMINOPHEN 5-325 MG PO TABS
1.0000 | ORAL_TABLET | Freq: Four times a day (QID) | ORAL | Status: DC | PRN
  Administered 2017-09-17: 1 via ORAL
  Filled 2017-09-17: qty 1

## 2017-09-17 MED ORDER — IPRATROPIUM-ALBUTEROL 0.5-2.5 (3) MG/3ML IN SOLN
3.0000 mL | RESPIRATORY_TRACT | Status: DC
  Administered 2017-09-17 (×3): 3 mL via RESPIRATORY_TRACT
  Filled 2017-09-17 (×4): qty 3

## 2017-09-17 MED ORDER — IPRATROPIUM-ALBUTEROL 0.5-2.5 (3) MG/3ML IN SOLN
3.0000 mL | Freq: Four times a day (QID) | RESPIRATORY_TRACT | Status: DC
  Administered 2017-09-18: 3 mL via RESPIRATORY_TRACT
  Filled 2017-09-17: qty 3

## 2017-09-17 MED ORDER — TECHNETIUM TC 99M DIETHYLENETRIAME-PENTAACETIC ACID
32.2000 | Freq: Once | INTRAVENOUS | Status: AC | PRN
  Administered 2017-09-17: 32.2 via RESPIRATORY_TRACT

## 2017-09-17 MED ORDER — MIRTAZAPINE 15 MG PO TABS
15.0000 mg | ORAL_TABLET | Freq: Every day | ORAL | Status: DC
  Administered 2017-09-17 – 2017-09-23 (×7): 15 mg via ORAL
  Filled 2017-09-17 (×7): qty 1

## 2017-09-17 MED ORDER — IPRATROPIUM-ALBUTEROL 0.5-2.5 (3) MG/3ML IN SOLN: 3.0000 mL | Freq: Once | RESPIRATORY_TRACT | Status: DC

## 2017-09-17 MED ORDER — ACETAMINOPHEN 325 MG PO TABS
650.0000 mg | ORAL_TABLET | Freq: Four times a day (QID) | ORAL | Status: DC | PRN
  Administered 2017-09-17: 650 mg via ORAL
  Filled 2017-09-17: qty 2

## 2017-09-17 MED ORDER — IPRATROPIUM BROMIDE 0.02 % IN SOLN: 0.5000 mg | RESPIRATORY_TRACT | Status: DC

## 2017-09-17 MED ORDER — METHYLPREDNISOLONE SODIUM SUCC 125 MG IJ SOLR
125.0000 mg | Freq: Once | INTRAMUSCULAR | Status: AC
  Administered 2017-09-17: 125 mg via INTRAVENOUS
  Filled 2017-09-17: qty 2

## 2017-09-17 MED ORDER — INFLUENZA VAC SPLIT HIGH-DOSE 0.5 ML IM SUSY
0.5000 mL | PREFILLED_SYRINGE | INTRAMUSCULAR | Status: DC
  Filled 2017-09-17: qty 0.5

## 2017-09-17 MED ORDER — ALBUTEROL SULFATE (2.5 MG/3ML) 0.083% IN NEBU
2.5000 mg | INHALATION_SOLUTION | RESPIRATORY_TRACT | Status: DC | PRN
  Administered 2017-09-20: 2.5 mg via RESPIRATORY_TRACT

## 2017-09-17 MED ORDER — IOHEXOL 300 MG/ML  SOLN
80.0000 mL | Freq: Once | INTRAMUSCULAR | Status: AC | PRN
  Administered 2017-09-17: 100 mL via INTRAVENOUS

## 2017-09-17 MED ORDER — PANTOPRAZOLE SODIUM 40 MG PO TBEC
40.0000 mg | DELAYED_RELEASE_TABLET | Freq: Every day | ORAL | Status: DC
  Administered 2017-09-17 – 2017-09-20 (×3): 40 mg via ORAL
  Filled 2017-09-17 (×3): qty 1

## 2017-09-17 NOTE — Progress Notes (Signed)
Patient admitted after midnight, please see H&P.  Still having RUQ pain.  Appears that patient has cholecystitis with choledocholithiasis on MRCP.  Esophageal issues appears to be known as she is s/p EGD in June.  IV abx.  GI consult.  Patient is also having worsening SOB- V/Q scan pending, nebs PRN, wean O2 as tolerated.  Will change to inpatient as patient will be here >48 hours and she is acutely ill requiring IV abx and further work up.  Eulogio Bear DO

## 2017-09-17 NOTE — Progress Notes (Addendum)
New Admission Note:  Arrival Method: Stretcher from ED with NT. Mental Orientation: A&O x4 Telemetry: Box 20, CCMD notified. Assessment: Completed Skin: Assessed with Danae Chen, RN. Check flowsheets IV: 20 g Left Forearm, saline locked Pain: 0/10. Pt states she is comfortable at this time Tubes: None present Safety Measures: Safety Fall Prevention Plan discussed with patient.  Admission: Completed Orientation: Patient has been orientated to the room, unit and the staff. Family: Husband at bedside  Orders have been reviewed and implemented. Will continue to monitor the patient. Call light has been placed within reach.  Upon arrival from the ED, RN noticed leakage from the patients foley catheter bag. RN broke red safety seal to change bag.  RN will continue to monitor patient.  Nena Polio BSN, RN

## 2017-09-17 NOTE — Progress Notes (Signed)
Pharmacy Antibiotic Note  Denise [name] is a 72 y.o. female admitted on 09/16/2017 with Cholelithiasis.  Pharmacy has been consulted for Zosyn dosing.  CC/HPI: Shortness of breath and abdominal pain. - CT abdomen pelvis done shows possible mass in the distal esophagus and also intra-and extrahepatic ductal dilation.   - chest x-ray shows pleural effusion and congestion and possible consolidation per the CAT scan.  PMH: COPD, hypertension, colon cancer status post resection, hyperparathyroidism scheduled for surgery next month, anxiety/dpression, DM, diverticulosis, HTN, HLD, insomnia, Vit D def   Plan: Zosyn 3.375g IV q 8hrs  Height: _0  (167.6 cm) Weight: 126 lb (57.2 kg) IBW/kg (Calculated) : 59.3  Temp (24hrs), Avg:97.7 F (36.5 C), Min:97.6 F (36.4 C), Max:97.7 F (36.5 C)  Recent Labs  Lab 09/16/17 2005 09/17/17 0350  WBC 8.7 7.0  CREATININE 1.36* 1.00    Estimated Creatinine Clearance: 45.9 mL/min (by C-G formula based on SCr of 1 mg/dL).    No Known Allergies   Holly Pring S. Alford Highland, PharmD, [place] Clinical Staff Pharmacist [phone]  Eilene Ghazi [facility] [place] 09/17/2017 8:48 AM

## 2017-09-17 NOTE — Progress Notes (Signed)
  Echocardiogram 2D Echocardiogram has been performed.  Denise [name] 09/17/2017, 12:05 PM

## 2017-09-17 NOTE — H&P (Addendum)
History and Physical    Denise [name] Plan RAL:[id number] DOB: 12-01-1945 DOA: 09/16/2017  PCP: [name], NP  Patient coming from: Home.  Chief Complaint: Shortness of breath and abdominal pain.  HPI: Denise [name]. Denise [name] is a 72 y.o. female with history of COPD, hypertension, colon cancer status post resection, hyperparathyroidism scheduled for surgery next month presents to the ER with complaints of increasing exertional shortness of breath over the last 1 week with epigastric pain.  Patient states that she has been having more than usual shortness of breath denies any recent travel.  Denies any chest pain.  Has been having epigastric pain with one episode of vomiting.  Denies any diarrhea.  ED Course: In the ER chest x-ray shows pleural effusion and congestion and possible consolidation per the CAT scan.  CT abdomen pelvis done shows possible mass in the distal esophagus and also intra-and extrahepatic ductal dilation.  LFTs are normal.  Patient complains of stabbing abdominal pain radiating to the back.  Patient in the ER was given nebulizer treatment steroids Lasix for COPD versus CHF treatment.  And will need further work-up for the possible esophageal mass and epigastric pain.  In the ER patient was found to have urinary retention and was placed on Foley catheter.  Review of Systems: As per HPI, rest all negative.   Past Medical History:  Diagnosis Date  . Altered mental status   . Anxiety and depression   . Colon cancer ([place]) 1988   Resected  . COPD (chronic obstructive pulmonary disease) ([place])   . Diabetes mellitus without complication ([name])    diet controlled- no meds. per pt  . Diverticulosis   . Hyperlipidemia   . Hypertension   . Insomnia   . Primary hyperparathyroidism ([place])   . Tubular adenoma of rectum 02/23/2013   low grade  . Vitamin D deficiency     Past Surgical History:  Procedure Laterality Date  . ABDOMINAL HYSTERECTOMY  08/2015  . BREAST  BIOPSY Left   . BREAST EXCISIONAL BIOPSY Right   . COLON RESECTION  1980's  . COLONOSCOPY    . ENDOMETRIAL BIOPSY  2009   negative  . INCONTINENCE SURGERY  2017     reports that she quit smoking about 30 years ago. Her smoking use included cigarettes. She has never used smokeless tobacco. She reports that she does not drink alcohol or use drugs.  No Known Allergies  Family History  Problem Relation Age of Onset  . Ovarian cancer Mother   . Breast cancer Neg Hx   . Hyperparathyroidism Neg Hx   . Colon cancer Neg Hx   . Esophageal cancer Neg Hx   . Stomach cancer Neg Hx     Prior to Admission medications   Medication Sig Start Date End Date Taking? Authorizing Provider  albuterol (PROVENTIL HFA;VENTOLIN HFA) 108 (90 Base) MCG/ACT inhaler Inhale 1-2 puffs into the lungs every 4 (four) hours as needed for wheezing or shortness of breath. 05/22/17  Yes [name], NP  aspirin EC 81 MG tablet Take 81 mg by mouth daily.   Yes [provider]  dicyclomine (BENTYL) 10 MG capsule TAKE 1 CAPSULE BY MOUTH THREE TIMES DAILY BEFORE A MEAL AS NEEDED FOR STOMACH CRAMPING. Patient taking differently: Take 10 mg by mouth See admin instructions. Take 1 capsule (10 mg)  by mouth three times daily before a meal as needed for stomach cramping. 06/19/17  Yes [name], NP  Fluticasone-Salmeterol [facility])  250-50 MCG/DOSE AEPB Inhale 1 puff into the lungs 2 (two) times daily as needed (shortness of breath/wheezing).    Yes [provider]  losartan (COZAAR) 100 MG tablet Take 1 tablet by mouth once daily for blood pressure. Patient taking differently: Take 100 mg by mouth daily. for blood pressure. 05/22/17  Yes [name], NP  Melatonin 5 MG TABS Take 5 mg by mouth at bedtime as needed (sleep).   Yes [provider]  mirtazapine (REMERON) 15 MG tablet Take 2 tablets by mouth at bedtime for depression and appetite. Patient taking differently: Take 15 mg  by mouth at bedtime. for depression and appetite. 06/19/17  Yes [name], NP  naproxen sodium (ALEVE) 220 MG tablet Take 220-440 mg by mouth daily as needed (pain).   Yes [provider]  NIFEdipine (PROCARDIA XL/ADALAT-CC) 60 MG 24 hr tablet TAKE 1 TABLET BY MOUTH ONCE DAILY FOR BLOOD PRESSURE Patient taking differently: Take 60 mg by mouth daily. FOR BLOOD PRESSURE 07/13/17  Yes [name], NP  sertraline (ZOLOFT) 25 MG tablet Take 1 tablet by mouth once daily for depression. Patient taking differently: Take 25 mg by mouth daily. for depression. 05/22/17  Yes [name], NP  atorvastatin (LIPITOR) 10 MG tablet Take 1 tablet by mouth at bedtime for cholesterol. Patient not taking: Reported on 09/16/2017 05/22/17   [name], NP  Fluticasone-Salmeterol (ADVAIR DISKUS) 250-50 MCG/DOSE AEPB Inhale 1 puff into the lungs 2 (two) times daily. Patient not taking: Reported on 09/16/2017 05/16/17   [name], NP  omeprazole (PRILOSEC) 20 MG capsule Take 1 capsule (20 mg total) by mouth daily before breakfast. About 30 mins before Patient not taking: Reported on 09/16/2017 06/21/17   Gatha Mayer, MD    Physical Exam: Vitals:   09/17/17 0015 09/17/17 0100 09/17/17 0200 09/17/17 0215  BP: (!) 157/91 (!) 160/91 140/83 (!) 143/81  Pulse: 99 93 96 94  Resp: _0 Temp:      TempSrc:      SpO2: 90% 90% 93% 93%  Weight:      Height:          Constitutional: Moderately built and nourished. Vitals:   09/17/17 0015 09/17/17 0100 09/17/17 0200 09/17/17 0215  BP: (!) 157/91 (!) 160/91 140/83 (!) 143/81  Pulse: 99 93 96 94  Resp: _1 Temp:      TempSrc:      SpO2: 90% 90% 93% 93%  Weight:      Height:       Eyes: Anicteric no pallor. ENMT: No discharge from the ears eyes nose or mouth. Neck: No mass felt.  No neck rigidity. Respiratory: No rhonchi or crepitations. Cardiovascular: S1-S2 heard no murmurs appreciated. Abdomen: Soft  nontender bowel sounds present. Musculoskeletal: Mild edema. Skin: No rash. Neurologic: Alert awake oriented to time place and person.  Moves all extremities. Psychiatric: Appears normal.  Normal affect.   Labs on Admission: I have personally reviewed following labs and imaging studies  CBC: Recent Labs  Lab 09/16/17 2005  WBC 8.7  NEUTROABS 7.5  HGB 14.1  HCT 44.0  MCV 87.6  PLT 300   Basic Metabolic Panel: Recent Labs  Lab 09/16/17 2005  NA 141  K 3.5  CL 106  CO2 24  GLUCOSE 139*  BUN 23  CREATININE 1.36*  CALCIUM 10.6*   GFR: Estimated Creatinine Clearance: 33.5 mL/min (A) (by C-G formula based on  SCr of 1.36 mg/dL (H)). Liver Function Tests: Recent Labs  Lab 09/16/17 2005  AST 23  ALT 28  ALKPHOS 111  BILITOT 0.7  PROT 6.9  ALBUMIN 4.2   Recent Labs  Lab 09/16/17 2005  LIPASE 23   No results for input(s): AMMONIA in the last 168 hours. Coagulation Profile: No results for input(s): INR, PROTIME in the last 168 hours. Cardiac Enzymes: No results for input(s): CKTOTAL, CKMB, CKMBINDEX, TROPONINI in the last 168 hours. BNP (last 3 results) No results for input(s): PROBNP in the last 8760 hours. HbA1C: No results for input(s): HGBA1C in the last 72 hours. CBG: No results for input(s): GLUCAP in the last 168 hours. Lipid Profile: No results for input(s): CHOL, HDL, LDLCALC, TRIG, CHOLHDL, LDLDIRECT in the last 72 hours. Thyroid Function Tests: No results for input(s): TSH, T4TOTAL, FREET4, T3FREE, THYROIDAB in the last 72 hours. Anemia Panel: No results for input(s): VITAMINB12, FOLATE, FERRITIN, TIBC, IRON, RETICCTPCT in the last 72 hours. Urine analysis:    Component Value Date/Time   COLORURINE YELLOW 09/16/2017 2002   APPEARANCEUR CLEAR 09/16/2017 2002   LABSPEC 1.010 09/16/2017 2002   PHURINE 6.0 09/16/2017 2002   GLUCOSEU 50 (A) 09/16/2017 2002   HGBUR SMALL (A) 09/16/2017 2002   BILIRUBINUR NEGATIVE 09/16/2017 2002   KETONESUR 5 (A)  09/16/2017 2002   PROTEINUR NEGATIVE 09/16/2017 2002   NITRITE NEGATIVE 09/16/2017 2002   LEUKOCYTESUR NEGATIVE 09/16/2017 2002   Sepsis Labs: _0 (procalcitonin:4,lacticidven:4) )No results found for this or any previous visit (from the past 240 hour(s)).   Radiological Exams on Admission: Ct Abdomen Pelvis W Contrast  Result Date: 09/17/2017 CLINICAL DATA:  Acute generalized abdominal pain. Vomiting. History of colon cancer resected 1989. EXAM: CT ABDOMEN AND PELVIS WITH CONTRAST TECHNIQUE: Multidetector CT imaging of the abdomen and pelvis was performed using the standard protocol following bolus administration of intravenous contrast. CONTRAST:  140m OMNIPAQUE IOHEXOL 300 MG/ML  SOLN COMPARISON:  06/14/2017 FINDINGS: Lower chest: Small bilateral pleural effusions with basilar atelectasis or consolidation. As seen previously there is an abnormal appearance of the distal esophagus and EG junction with poor definition of the esophageal wall and edema around the distal esophagus. Lymphadenopathy of the EG junction. Appearance is suspicious for esophageal neoplasm. If not previously evaluated, endoscopy may be of use. Hepatobiliary: No focal liver lesions. Gallbladder is distended without wall thickening or inflammatory changes. Mild intra and extrahepatic bile duct dilatation without cause identified. Correlation with liver function studies is recommended. Could consider follow-up with elective MRCP. Pancreas: Unremarkable. No pancreatic ductal dilatation or surrounding inflammatory changes. Spleen: Normal in size without focal abnormality. Adrenals/Urinary Tract: Bilateral renal cysts. Nephrograms are symmetrical. No hydronephrosis or hydroureter. No adrenal gland nodules. Bladder is partially decompressed with a Foley catheter. Stomach/Bowel: Diffusely stool-filled colon. Stomach, small bowel, and colon are not abnormally distended. Mesenteric edema particularly in the pelvis. No obvious wall  thickening although under distention would limit evaluation of bowel wall. Appendix is normal. Vascular/Lymphatic: Diffuse calcification of the abdominal aorta. No significant retroperitoneal lymphadenopathy. Reproductive: Status post hysterectomy. No adnexal masses. Other: Small amount of free fluid in the upper abdomen and pelvis. No free air. Surgical scarring in the anterior abdominal wall. Musculoskeletal: No destructive bone lesions. IMPRESSION: 1. Small bilateral pleural effusions with basilar atelectasis or consolidation. 2. Abnormal appearance of the distal esophagus and EG junction with poor definition of the esophageal wall and edema around the distal esophagus. Lymphadenopathy in the junction. Appearance is suspicious for esophageal neoplasm. Correlation  with endoscopy is recommended. 3. Mild intra and extrahepatic bile duct dilatation without cause identified. Correlation with liver function studies is recommended. Consider follow-up with elective MRCP. 4. Mesenteric edema particularly in the pelvis. This could represent secondary changes due to enteritis. No evidence of bowel obstruction but bowel wall is not completely evaluated due to under distention. 5. Small amount of free fluid in the abdomen and pelvis. Aortic Atherosclerosis (ICD10-I70.0). Electronically Signed   By: Lucienne Capers M.D.   On: 09/17/2017 01:35   Dg Abdomen Acute W/chest  Result Date: 09/16/2017 CLINICAL DATA:  Patient with abdominal pain. EXAM: DG ABDOMEN ACUTE W/ 1V CHEST COMPARISON:  Chest radiograph 06/02/2016 FINDINGS: Monitoring leads overlie the patient. Cardiac contours upper limits of normal. Aortic atherosclerosis. Bilateral interstitial pulmonary opacities. Small left pleural effusion. Gas is demonstrated within nondilated loops of bowel throughout the abdomen. Stool throughout the colon. Lumbar spine degenerative changes. IMPRESSION: Cardiomegaly with pulmonary vascular redistribution and mild interstitial  edema. Small left effusion. Nonobstructed bowel gas pattern. Stool throughout the colon. Electronically Signed   By: Lovey Newcomer M.D.   On: 09/16/2017 21:13    EKG: Independently reviewed.  Normal sinus rhythm with LVH.  Assessment/Plan Principal Problem:   Acute respiratory failure with hypoxia ([name]) Active Problems:   COPD GOLD II    Essential hypertension   Hyperparathyroidism, primary ([place])   History of colon cancer   Epigastric pain    1. Acute respiratory failure with hypoxia exertional symptoms differential includes CHF versus COPD.  X-rays do show bilateral effusion.  BNP 300.  Patient was given Lasix 40 mg IV in the ER and based on response with no further doses.  We will also check d-dimer and if positive will get VQ scan.  Check 2D echo continue with nebulizer treatment Pulmicort.  On exam patient is not wheezing.  Continues to wheeze will need further dose of IV steroids.  Since CAT scan done also shows possibility of consolidation patient has been placed on empiric antibiotics which can be discontinued if procalcitonin is negative. 2. Epigastric pain with CT scan showing possible distal esophageal mass and also intrahepatic and extrahepatic ductal dilation for which I have ordered MRCP.  For the possible distal esophageal mass will need GI consult for EGD.  For now I have kept patient on Pepcid.  His pain medication. 3. Hypertension we will keep patient on PRN IV hydralazine.  Until patient can take orally. 4. Hypothyroidism is scheduled for surgery with Dr. Rosendo Gros next month. 5. Urinary retention on Foley catheter will need to voiding trials.  Check UA.   DVT prophylaxis: SCDs. Code Status: Full code. Family Communication: Discussed with patient. Disposition Plan: Home. Consults called: None. Admission status: Observation.   Rise Patience MD [facility] Pager [phone].  If 7PM-7AM, please contact night-coverage [email or web address] Password  [email or web address]  09/17/2017, 2:40 AM

## 2017-09-17 NOTE — Progress Notes (Signed)
Initial Nutrition Assessment  DOCUMENTATION CODES:   Severe malnutrition in context of chronic illness  INTERVENTION:   -MVI with minerals daily -Glucerna Shake po TID, each supplement provides 220 kcal and 10 grams of protein  NUTRITION DIAGNOSIS:   Severe Malnutrition related to chronic illness(COPD) as evidenced by moderate fat depletion, severe fat depletion, moderate muscle depletion, severe muscle depletion, energy intake < 75% for > or equal to 1 month.  GOAL:   Patient will meet greater than or equal to 90% of their needs  MONITOR:   PO intake, Supplement acceptance, Diet advancement, Labs, Weight trends, Skin, I & O's  REASON FOR ASSESSMENT:   Malnutrition Screening Tool    ASSESSMENT:    Denise [name] is a 72 y.o. female with history of COPD, hypertension, colon cancer status post resection, hyperparathyroidism scheduled for surgery next month presents to the ER with complaints of increasing exertional shortness of breath over the last 1 week with epigastric pain.   Pt admitted with acute respiratory failure with hypoxia (likely secondary to CHF or COPD).   Case discussed with RN prior to visit, who reports that pt has been complaining of abdominal pain. Plan for VQ scan to assess gallbladder and plan for ERCP tomorrow. Pt has been advanced to full liquid diet.   Spoke with pt at bedside, who reports a general decline in health over the past 8 weeks. She reports that she was recently diagnosed with hyperparathyroidism and with hypercalcemia, which pt attributes to diminished appetite. She shares she tries to consume 3 meals per day, but often can only consume a few bites before feeling full. She reports her family members became very concerned a few days prior to hospitalization as they all went to eat a favorite restaurant and pt barely touched her food. She is currently hungry and eager for a full liquid diet.   Pt endorses a progressive 25% wt loss over the  past 2 years. She shares her UBW is 150#, which she last weighed back in 2017. Reviewed wt hx, which reveals a 2.2% wt loss over the past 3 months, which is not significant for time frame. She reports her clothing has become much looser. She still works part-time as a Social worker, however, denies any difficulty with mobility or decreased endurance. She started Glucerna supplements over the past few weeks (consumed one per day) due to weight loss and poor oral intake.   Discussed importance of good meal and supplement intake to promote healing. Pt reports that she would like to continue Glucerna supplements, due to hx of DM.   Last Hgb A1c: 6.2 (04/11/17). PTA DM medications are none. Pt reports CBGS usually run in the 80's, but have increased during hospitalization. RD discussed effect of blood sugars on stress response in acute illness.   Labs reviewed: CBGS: 150 (inpatient orders for glycemic control are 0-9 units insulin aspart every 4 hours).   NUTRITION - FOCUSED PHYSICAL EXAM:    Most Recent Value  Orbital Region  Moderate depletion  Upper Arm Region  Severe depletion  Thoracic and Lumbar Region  Severe depletion  Buccal Region  Moderate depletion  Temple Region  Moderate depletion  Clavicle Bone Region  Severe depletion  Clavicle and Acromion Bone Region  Severe depletion  Scapular Bone Region  Severe depletion  Dorsal Hand  Moderate depletion  Patellar Region  Moderate depletion  Anterior Thigh Region  Moderate depletion  Posterior Calf Region  Moderate depletion  Edema (RD  Assessment)  Mild  Hair  Reviewed  Eyes  Reviewed  Mouth  Reviewed  Skin  Reviewed  Nails  Reviewed       Diet Order:   Diet Order            Diet NPO time specified Except for: Sips with Meds, Ice Chips  Diet effective midnight        Diet full liquid Room service appropriate? Yes; Fluid consistency: Thin  Diet effective now              EDUCATION NEEDS:   Education needs have  been addressed  Skin:  Skin Assessment: Reviewed RN Assessment  Last BM:  09/15/17  Height:   Ht Readings from Last 1 Encounters:  09/17/17 _0  (1.676 m)    Weight:   Wt Readings from Last 1 Encounters:  09/17/17 57.2 kg    Ideal Body Weight:  59.1 kg  BMI:  Body mass index is 20.34 kg/m.  Estimated Nutritional Needs:   Kcal:  1800-2000  Protein:  100-115 grams  Fluid:  1.8-2.0 L    Shields Pautz A. Jimmye Norman, RD, LDN, CDE Pager: [phone] After hours Pager: [phone]

## 2017-09-17 NOTE — ED Provider Notes (Signed)
2:11 AM Assumed care from Dr. Laverta Baltimore.  Patient here with multiple complaints.  Patient has been short of breath for 1 week without chest pain.  Does not wear oxygen at home.  Has known history of COPD and diastolic heart failure.  Given duo nebs, Lasix.  Requiring oxygen here today.  Will give IV Solu-Medrol.  Patient also having upper gastric abdominal pain.  CT scan shows what appears to be an esophageal mass.  She will likely need endoscopy.  Patient also found to have urinary retention.  Foley catheter in place.  Urine shows no sign of infection.  Will discuss with medicine for admission.      Discussed patient's case with hospitalist, Dr. Hal Hope.  I have recommended admission and patient (and family if present) agree with this plan. Admitting physician will place admission orders.   I reviewed all nursing notes, vitals, pertinent previous records, EKGs, lab and urine results, imaging (as available).     Ward, Delice Bison, DO 09/17/17 [phone]

## 2017-09-17 NOTE — Consult Note (Addendum)
[facility] Consult: 8:35 AM 09/17/2017  LOS: 0 days    Referring Provider: Dr Eliseo Squires  Primary Care Physician:  [name], NP Primary Gastroenterologist:  Dr. Carlean Purl.       Reason for Consultation:  Choledocholithiasis.     HPI: Denise Lanum. [name] is a 72 y.o. female.  PMH COPD.  Anxiety.  S/p vaginal hysterectomy, bil BSO, pelvic sling procedure 08/2015.   Primary hyperparathyroidism.  Hypercalcemia. Scheduled for parathyroidectomy on 10/02/17 with Dr Rosendo Gros.    Hx colon cancer, surgical resection 1988.  Latest surveillance colonoscopy 05/2013 in [place], showed diverticulosis and polyps (tubular adenomas) removed.      06/21/17 EGD: To evaluate Abnormal CT, early satiety, LLQ pain, anorexia, wt loss.  Diffuse, moderate inflammation with erosion in gastric antrum (path: mild chronic gastritis, no cancer, no H Pylori).  2 cm HH.  O/w normal study.    Pt is not taking a PPI.    2 weeks of DOE and resting SOB, worse when flat.  No significant cough.  MDI is not helping.  + tachycardia but no chest pain.  1 week of epigastric pain, taking 2 Aleve/day with incomplete anaelgesia. Nausea and dry heaves late last week, but generally not nauseated.  + constipation, BM's every 2 to 3 days with straining.  Ongoing, chronic anorexia without dysphagia.  Ongoing weight loss, at least 25# in last 6 months.  + weakness.   Epigastric pain radiated to thoracic back and severe over weekend, came to ED for eval.      Labs with AKI, normal Lipase and LFTs.  Normal WBCs.  Slight elevation D dimer at 0.9.   BNP 310.     AAS: NOBGP.  Stool in colon.  Mild pulmonary edema.   CT ab/pelvis, with contrast.  Similar to prior abnormal distal esophagus and associated lymphadenopathy, suspicious for neoplasm.  GB distended but not  thickened.  Mild intra/extra biliary ductal dilatation.  PD not dilated.  Stool throughout colon.   Mesenteric edema, especially in pelvis, query enteritis.  Small FF in abd/pelvis.  MRCP.  GB sludge and mild wall thickening and edema, likely acute cholecystitis.  Mild dilation of bile ducts with filling defects in mid to distal CBD.  Focal narrowing of PD in region pancreatic body without mass.   Several accessory spleens. No gastric or intestinal abnormalities. FF in upper abdomen.     VQ scan pending.    Fm hx of thyroidectomy in dtr, parathyroidectomy in sister, ovarian cancer in mom (in her 47s).   No ETOH.    Past Medical History:  Diagnosis Date  . Altered mental status   . Anxiety and depression   . Colon cancer ([place]) 1988   Resected  . COPD (chronic obstructive pulmonary disease) ([place])   . Diabetes mellitus without complication ([name])    diet controlled- no meds. per pt  . Diverticulosis   . Hyperlipidemia   . Hypertension   . Insomnia   . Primary hyperparathyroidism ([place])   . Tubular adenoma of rectum 02/23/2013   low grade  .  Vitamin D deficiency     Past Surgical History:  Procedure Laterality Date  . ABDOMINAL HYSTERECTOMY  08/2015  . BREAST BIOPSY Left   . BREAST EXCISIONAL BIOPSY Right   . COLON RESECTION  1980's  . COLONOSCOPY    . ENDOMETRIAL BIOPSY  2009   negative  . INCONTINENCE SURGERY  2017    Prior to Admission medications   Medication Sig Start Date End Date Taking? Authorizing Provider  albuterol (PROVENTIL HFA;VENTOLIN HFA) 108 (90 Base) MCG/ACT inhaler Inhale 1-2 puffs into the lungs every 4 (four) hours as needed for wheezing or shortness of breath. 05/22/17  Yes [name], NP  aspirin EC 81 MG tablet Take 81 mg by mouth daily.   Yes [provider]  dicyclomine (BENTYL) 10 MG capsule TAKE 1 CAPSULE BY MOUTH THREE TIMES DAILY BEFORE A MEAL AS NEEDED FOR STOMACH CRAMPING. Patient taking differently: Take 10 mg by mouth See admin  instructions. Take 1 capsule (10 mg)  by mouth three times daily before a meal as needed for stomach cramping. 06/19/17  Yes [name], NP  Fluticasone-Salmeterol [facility] INHUB) 250-50 MCG/DOSE AEPB Inhale 1 puff into the lungs 2 (two) times daily as needed (shortness of breath/wheezing).    Yes [provider]  losartan (COZAAR) 100 MG tablet Take 1 tablet by mouth once daily for blood pressure. Patient taking differently: Take 100 mg by mouth daily. for blood pressure. 05/22/17  Yes [name], NP  Melatonin 5 MG TABS Take 5 mg by mouth at bedtime as needed (sleep).   Yes [provider]  mirtazapine (REMERON) 15 MG tablet Take 2 tablets by mouth at bedtime for depression and appetite. Patient taking differently: Take 15 mg by mouth at bedtime. for depression and appetite. 06/19/17  Yes [name], NP  naproxen sodium (ALEVE) 220 MG tablet Take 220-440 mg by mouth daily as needed (pain).   Yes [provider]  NIFEdipine (PROCARDIA XL/ADALAT-CC) 60 MG 24 hr tablet TAKE 1 TABLET BY MOUTH ONCE DAILY FOR BLOOD PRESSURE Patient taking differently: Take 60 mg by mouth daily. FOR BLOOD PRESSURE 07/13/17  Yes [name], NP  sertraline (ZOLOFT) 25 MG tablet Take 1 tablet by mouth once daily for depression. Patient taking differently: Take 25 mg by mouth daily. for depression. 05/22/17  Yes [name], NP  atorvastatin (LIPITOR) 10 MG tablet Take 1 tablet by mouth at bedtime for cholesterol. Patient not taking: Reported on 09/16/2017 05/22/17   [name], NP  Fluticasone-Salmeterol (ADVAIR DISKUS) 250-50 MCG/DOSE AEPB Inhale 1 puff into the lungs 2 (two) times daily. Patient not taking: Reported on 09/16/2017 05/16/17   [name], NP  omeprazole (PRILOSEC) 20 MG capsule Take 1 capsule (20 mg total) by mouth daily before breakfast. About 30 mins before Patient not taking: Reported on 09/16/2017 06/21/17   Gatha Mayer, MD     Scheduled Meds: . budesonide (PULMICORT) nebulizer solution  0.25 mg Nebulization BID  . chlorhexidine  15 mL Mouth Rinse BID  . [START ON 09/18/2017] Influenza vac split quadrivalent PF  0.5 mL Intramuscular Tomorrow-1000  . ipratropium-albuterol  3 mL Nebulization Q4H  . mouth rinse  15 mL Mouth Rinse q12n4p  . mirtazapine  15 mg Oral QHS   Infusions: . famotidine (PEPCID) IV     PRN Meds: acetaminophen **OR** acetaminophen, albuterol, fentaNYL (SUBLIMAZE) injection, hydrALAZINE, ondansetron **OR** ondansetron (ZOFRAN) IV   Allergies as of 09/16/2017  . (No Known  Allergies)    Family History  Problem Relation Age of Onset  . Ovarian cancer Mother   . Breast cancer Neg Hx   . Hyperparathyroidism Neg Hx   . Colon cancer Neg Hx   . Esophageal cancer Neg Hx   . Stomach cancer Neg Hx     Social History   Social History Narrative   Married. 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  GU:  Urinary frequency, several episodes nocturia q noc.  No hematuria. GI:  Per HPI Heme:  No unusual bleeding, bruising.     Transfusions:  None ever.   Neuro:  No headaches, no peripheral tingling or numbness Derm:  No itching, no rash or sores.  Endocrine:  No sweats or chills.  Immunization:  Not queried Travel:  None beyond local counties in last few months.    PHYSICAL EXAM: Vital signs in last 24 hours: Vitals:   09/17/17 0344 09/17/17 0814  BP: (!) 163/98   Pulse: 92   Resp: (!) 22   Temp: 97.6 F (36.4 C)   SpO2: 92% 93%   Wt Readings from Last 3 Encounters:  09/17/17 57.2 kg  06/21/17 58.1 kg  06/20/17 58.5 kg    General: thin, alert, comfortable.  Chronically ill looking AAF Head:  No facial asymmetry, head trauma or edema  Eyes:  No icterus or conj  pallor.  EOMI.  No nystagmus Ears:  Not HOH  Nose:  No congestion or discharge Mouth:  OP with moist, pink, clear oral MM.  Full dentures in place.  Tongue midline Neck:  No JVD, mass, TMG.   Lungs:  Clear bil.  No dyspnea at rest or with speech.   Heart: RRR.  No MRG.  S1, S2 present.   Abdomen:  Soft, mild tenderness in epigastrium and LLQ.  No mass,  No HSM, no bruits, no hernias.  Well healed lower midline scar.  .   Rectal: deferred   Musc/Skeltl: no joint swelling, redness or deformities.   Extremities:  No CCE  Neurologic:  Oriented x 3.  Good historian.  Moves all 4 limbs, strength not tested.  No tremors.   Skin:  No rash, sores or suspicious lesions.   Tattoos:  none Nodes:  No cervical adenopathy.     Psych:  Pleasant, calm, cooperative.  Speech fluid.     Intake/Output from previous day: 09/15 0701 - 09/16 0700 In: -  Out: 3200 [Urine:3200]  LAB RESULTS: Recent Labs    09/16/17 2005 09/17/17 0350  WBC 8.7 7.0  HGB 14.1 14.5  HCT 44.0 44.1  PLT 212 227   BMET Lab Results  Component Value Date   NA 143 09/17/2017   NA 141 09/16/2017   NA 144 06/07/2017   K 3.6 09/17/2017   K 3.5 09/16/2017   K 3.5 06/07/2017   CL 104 09/17/2017   CL 106 09/16/2017   CL 105 06/07/2017   CO2 27 09/17/2017   CO2 24 09/16/2017   CO2 32 06/07/2017   GLUCOSE 180 (H) 09/17/2017   GLUCOSE 139 (H) 09/16/2017   GLUCOSE 112 (H) 06/07/2017   BUN 18 09/17/2017   BUN 23 09/16/2017   BUN 15 06/07/2017  CREATININE 1.00 09/17/2017   CREATININE 1.36 (H) 09/16/2017   CREATININE 1.03 06/07/2017   CALCIUM 10.4 (H) 09/17/2017   CALCIUM 10.6 (H) 09/16/2017   CALCIUM 11.5 (H) 06/07/2017   CALCIUM 11.4 (H) 06/07/2017   LFT Recent Labs    09/16/17 2005 09/17/17 0350  PROT 6.9 7.1  ALBUMIN 4.2 4.2  AST 23 25  ALT 28 31  ALKPHOS 111 116  BILITOT 0.7 1.0  BILIDIR  --  0.2  IBILI  --  0.8   Lipase     Component Value Date/Time   LIPASE 23 09/16/2017 2005    RADIOLOGY  STUDIES: Ct Abdomen Pelvis W Contrast  Result Date: 09/17/2017 CLINICAL DATA:  Acute generalized abdominal pain. Vomiting. History of colon cancer resected 1989. EXAM: CT ABDOMEN AND PELVIS WITH CONTRAST TECHNIQUE: Multidetector CT imaging of the abdomen and pelvis was performed using the standard protocol following bolus administration of intravenous contrast. CONTRAST:  149m OMNIPAQUE IOHEXOL 300 MG/ML  SOLN COMPARISON:  06/14/2017 FINDINGS: Lower chest: Small bilateral pleural effusions with basilar atelectasis or consolidation. As seen previously there is an abnormal appearance of the distal esophagus and EG junction with poor definition of the esophageal wall and edema around the distal esophagus. Lymphadenopathy of the EG junction. Appearance is suspicious for esophageal neoplasm. If not previously evaluated, endoscopy may be of use. Hepatobiliary: No focal liver lesions. Gallbladder is distended without wall thickening or inflammatory changes. Mild intra and extrahepatic bile duct dilatation without cause identified. Correlation with liver function studies is recommended. Could consider follow-up with elective MRCP. Pancreas: Unremarkable. No pancreatic ductal dilatation or surrounding inflammatory changes. Spleen: Normal in size without focal abnormality. Adrenals/Urinary Tract: Bilateral renal cysts. Nephrograms are symmetrical. No hydronephrosis or hydroureter. No adrenal gland nodules. Bladder is partially decompressed with a Foley catheter. Stomach/Bowel: Diffusely stool-filled colon. Stomach, small bowel, and colon are not abnormally distended. Mesenteric edema particularly in the pelvis. No obvious wall thickening although under distention would limit evaluation of bowel wall. Appendix is normal. Vascular/Lymphatic: Diffuse calcification of the abdominal aorta. No significant retroperitoneal lymphadenopathy. Reproductive: Status post hysterectomy. No adnexal masses. Other: Small amount of free  fluid in the upper abdomen and pelvis. No free air. Surgical scarring in the anterior abdominal wall. Musculoskeletal: No destructive bone lesions. IMPRESSION: 1. Small bilateral pleural effusions with basilar atelectasis or consolidation. 2. Abnormal appearance of the distal esophagus and EG junction with poor definition of the esophageal wall and edema around the distal esophagus. Lymphadenopathy in the junction. Appearance is suspicious for esophageal neoplasm. Correlation with endoscopy is recommended. 3. Mild intra and extrahepatic bile duct dilatation without cause identified. Correlation with liver function studies is recommended. Consider follow-up with elective MRCP. 4. Mesenteric edema particularly in the pelvis. This could represent secondary changes due to enteritis. No evidence of bowel obstruction but bowel wall is not completely evaluated due to under distention. 5. Small amount of free fluid in the abdomen and pelvis. Aortic Atherosclerosis (ICD10-I70.0). Electronically Signed   By: WLucienne CapersM.D.   On: 09/17/2017 01:35   Mr Abdomen Mrcp Wo Contrast  Result Date: 09/17/2017 CLINICAL DATA:  Suspect pancreatitis.  Atypical presentation. EXAM: MRI ABDOMEN WITHOUT CONTRAST  (INCLUDING MRCP) TECHNIQUE: Multiplanar multisequence MR imaging of the abdomen was performed. Heavily T2-weighted images of the biliary and pancreatic ducts were obtained, and three-dimensional MRCP images were rendered by post processing. COMPARISON:  CT abdomen and pelvis 09/17/2017 FINDINGS: Lower chest: Small bilateral pleural effusions with basilar atelectasis. Hepatobiliary: No focal lesions are  demonstrated in the liver. The gallbladder is distended with layering of sludge in the gallbladder, mild gallbladder wall thickening, and with edema around the gallbladder and extending up along the liver edge. This probably reflects acute cholecystitis. Bile ducts are mildly dilated with evidence of filling defects in the  mid and distal common bile duct suggesting common duct stones. Pancreas: Pancreatic duct is not significantly dilated but there is a persistent area of narrowing demonstrated at the body of the pancreas. This could represent inflammatory stricture or an intraductal neoplasm. Pancreatic parenchyma appears homogeneous. Spleen: Spleen size is normal. Several accessory spleens are present. No focal lesions. Adrenals/Urinary Tract: Multiple bilateral renal cysts. No hydronephrosis or hydroureter. Stomach/Bowel: Visualized stomach, small, and large bowel are not abnormally distended. Vascular/Lymphatic: Normal caliber abdominal aorta and inferior vena cava. No significant lymphadenopathy. Other:  None. Musculoskeletal: Lumbar scoliosis convex towards the right. Mostly fatty replaced marrow. No expansile bone lesions are appreciated. IMPRESSION: 1. Probable cholecystitis and cholelithiasis as above. 2. Intra and extrahepatic bile duct dilatation. Probable choledocholithiasis. 3. Focal area of persistent narrowing of the pancreatic duct at the body. No specific mass identified. This could represent inflammatory stricture or intraductal neoplasm. 4. Small amount of free fluid in the upper abdomen, likely reactive. Electronically Signed   By: Lucienne Capers M.D.   On: 09/17/2017 06:26   Mr 3d Recon At Scanner  Result Date: 09/17/2017 CLINICAL DATA:  Suspect pancreatitis.  Atypical presentation. EXAM: MRI ABDOMEN WITHOUT CONTRAST  (INCLUDING MRCP) TECHNIQUE: Multiplanar multisequence MR imaging of the abdomen was performed. Heavily T2-weighted images of the biliary and pancreatic ducts were obtained, and three-dimensional MRCP images were rendered by post processing. COMPARISON:  CT abdomen and pelvis 09/17/2017 FINDINGS: Lower chest: Small bilateral pleural effusions with basilar atelectasis. Hepatobiliary: No focal lesions are demonstrated in the liver. The gallbladder is distended with layering of sludge in the  gallbladder, mild gallbladder wall thickening, and with edema around the gallbladder and extending up along the liver edge. This probably reflects acute cholecystitis. Bile ducts are mildly dilated with evidence of filling defects in the mid and distal common bile duct suggesting common duct stones. Pancreas: Pancreatic duct is not significantly dilated but there is a persistent area of narrowing demonstrated at the body of the pancreas. This could represent inflammatory stricture or an intraductal neoplasm. Pancreatic parenchyma appears homogeneous. Spleen: Spleen size is normal. Several accessory spleens are present. No focal lesions. Adrenals/Urinary Tract: Multiple bilateral renal cysts. No hydronephrosis or hydroureter. Stomach/Bowel: Visualized stomach, small, and large bowel are not abnormally distended. Vascular/Lymphatic: Normal caliber abdominal aorta and inferior vena cava. No significant lymphadenopathy. Other:  None. Musculoskeletal: Lumbar scoliosis convex towards the right. Mostly fatty replaced marrow. No expansile bone lesions are appreciated. IMPRESSION: 1. Probable cholecystitis and cholelithiasis as above. 2. Intra and extrahepatic bile duct dilatation. Probable choledocholithiasis. 3. Focal area of persistent narrowing of the pancreatic duct at the body. No specific mass identified. This could represent inflammatory stricture or intraductal neoplasm. 4. Small amount of free fluid in the upper abdomen, likely reactive. Electronically Signed   By: Lucienne Capers M.D.   On: 09/17/2017 06:26   Dg Abdomen Acute W/chest  Result Date: 09/16/2017 CLINICAL DATA:  Patient with abdominal pain. EXAM: DG ABDOMEN ACUTE W/ 1V CHEST COMPARISON:  Chest radiograph 06/02/2016 FINDINGS: Monitoring leads overlie the patient. Cardiac contours upper limits of normal. Aortic atherosclerosis. Bilateral interstitial pulmonary opacities. Small left pleural effusion. Gas is demonstrated within nondilated loops of  bowel throughout the abdomen.  Stool throughout the colon. Lumbar spine degenerative changes. IMPRESSION: Cardiomegaly with pulmonary vascular redistribution and mild interstitial edema. Small left effusion. Nonobstructed bowel gas pattern. Stool throughout the colon. Electronically Signed   By: Lovey Newcomer M.D.   On: 09/16/2017 21:13     IMPRESSION:   *  Cholelithiasis.  Suspicion of acute cholecystitis.  Needs surgical evaluation re: cholecystectomy.    *   Suspicion of choledocholithiasis.   Dilated biliary ducts and PD.    *  Abnormal esophagus on CT.  EGD done in 06/2017 revealed normal esophagus and fundal gastritis.   Pt not taking PPI or H2 blocker at home.  Omeprazole 20 mg daily advised on Dr Celesta Aver EGD report 06/2017.  Pt recently using 2 Aleve daily for ~ 1 week.    *  Mesenteric edema in pelvis.  ? Enteritis.   Pt has constipation likely related to hypercalcemia.     *  Pleural effusions.  COPD.  Elevated D dimer, VQ scan ordered, pending.    *  Primary hyperparathyroidism, hypercalcemia.  Parathyroidectomy set for 10/02/17 with Dr Rosendo Gros.    *  Colon cancer, partial colectomy in 1988, up to date on surveillance colonoscopy, adenoma removed in 2015.  Repeat study due 02/2018.      PLAN:     *   Dr Carlean Purl will be seeing pt.  Scheduled ERCP for tomorrow 8 AM.  Dr Carlean Purl will need to give final approval for this.     *  Ordered 1 x dose of hydrocodone for pain mgt at request of RN who said Fentanyl made pt super drowsy.  If effective RN will contact hospitalist for prn Rx.    *  No need for NPO today, no need for IV Pepcid.   Full liquid diet.     Azucena Freed  09/17/2017, 8:35 AM Phone [place] Attending   I have taken an interval history, reviewed the chart and examined the patient. I agree with the Advanced Practitioner's note, impression and recommendations.    Additions:  I have reviewed all images with radiologist. I do not and he  does not think biliary and pancreas findings are real I.e. No stones/stricture.  ? An esophageal diverticulum.  Not sure why she has ascites.  Hypercalcemia definitely an issue and causing sxs and constipatiopn may be causing abd pain.  Dyspnea is what brought her in.  Dyspnea Abd pain Nausea Anorexia Hyperparathyroid and hypercalcemic Constipated Abnormal GE junction on CT Ascites    Plan: Ba swallow ? Diverticulum No ERCP Await echo results F/U tomorrow  Gatha Mayer, MD, [facility] 09/17/2017 4:34 PM

## 2017-09-17 NOTE — ED Notes (Signed)
Patient transported to CT 

## 2017-09-17 NOTE — Progress Notes (Signed)
RN administered 500 cc tap water enema per MD order. Pt had small bowel movement and is currently sitting on bedside commode.

## 2017-09-18 ENCOUNTER — Inpatient Hospital Stay (HOSPITAL_COMMUNITY): Payer: Medicare HMO

## 2017-09-18 ENCOUNTER — Ambulatory Visit: Payer: Medicare HMO | Admitting: Primary Care

## 2017-09-18 ENCOUNTER — Encounter (HOSPITAL_COMMUNITY)
Admission: EM | Disposition: A | Discharge status: Home / Self Care | Payer: Self-pay | Source: Home / Self Care | Attending: Internal Medicine

## 2017-09-18 DIAGNOSIS — R1030 Lower abdominal pain, unspecified: Secondary | ICD-10-CM

## 2017-09-18 DIAGNOSIS — I361 Nonrheumatic tricuspid (valve) insufficiency: Secondary | ICD-10-CM

## 2017-09-18 LAB — GLUCOSE, CAPILLARY
GLUCOSE-CAPILLARY: 107 mg/dL — AB (ref 70–99)
GLUCOSE-CAPILLARY: 122 mg/dL — AB (ref 70–99)
GLUCOSE-CAPILLARY: 136 mg/dL — AB (ref 70–99)
GLUCOSE-CAPILLARY: 141 mg/dL — AB (ref 70–99)
GLUCOSE-CAPILLARY: 142 mg/dL — AB (ref 70–99)
Glucose-Capillary: 150 mg/dL — ABNORMAL HIGH (ref 70–99)

## 2017-09-18 LAB — COMPREHENSIVE METABOLIC PANEL
ALBUMIN: 3.5 g/dL (ref 3.5–5.0)
ALT: 22 U/L (ref 0–44)
ANION GAP: 16 — AB (ref 5–15)
AST: 22 U/L (ref 15–41)
Alkaline Phosphatase: 89 U/L (ref 38–126)
BILIRUBIN TOTAL: 1.2 mg/dL (ref 0.3–1.2)
BUN: 21 mg/dL (ref 8–23)
CHLORIDE: 102 mmol/L (ref 98–111)
CO2: 24 mmol/L (ref 22–32)
Calcium: 11 mg/dL — ABNORMAL HIGH (ref 8.9–10.3)
Creatinine, Ser: 1.05 mg/dL — ABNORMAL HIGH (ref 0.44–1.00)
GFR calc Af Amer: 60 mL/min — ABNORMAL LOW (ref >60)
GFR calc non Af Amer: 52 mL/min — ABNORMAL LOW (ref >60)
GLUCOSE: 105 mg/dL — AB (ref 70–99)
POTASSIUM: 3.4 mmol/L — AB (ref 3.5–5.1)
SODIUM: 142 mmol/L (ref 135–145)
Total Protein: 6.1 g/dL — ABNORMAL LOW (ref 6.5–8.1)

## 2017-09-18 LAB — ECHOCARDIOGRAM COMPLETE
Height: 66 in
Weight: 2016 oz

## 2017-09-18 LAB — CBC
HEMATOCRIT: 45.5 % (ref 36.0–46.0)
Hemoglobin: 14.7 g/dL (ref 12.0–15.0)
MCH: 28.2 pg (ref 26.0–34.0)
MCHC: 32.3 g/dL (ref 30.0–36.0)
MCV: 87.2 fL (ref 78.0–100.0)
PLATELETS: 260 10*3/uL (ref 150–400)
RBC: 5.22 MIL/uL — ABNORMAL HIGH (ref 3.87–5.11)
RDW: 15.1 % (ref 11.5–15.5)
WBC: 8.4 10*3/uL (ref 4.0–10.5)

## 2017-09-18 LAB — LACTIC ACID, PLASMA
Lactic Acid, Venous: 1 mmol/L (ref 0.5–1.9)
Lactic Acid, Venous: 0.9 mmol/L (ref 0.5–1.9)

## 2017-09-18 SURGERY — ENDOSCOPIC RETROGRADE CHOLANGIOPANCREATOGRAPHY (ERCP) WITH PROPOFOL
Anesthesia: General

## 2017-09-18 MED ORDER — IPRATROPIUM-ALBUTEROL 0.5-2.5 (3) MG/3ML IN SOLN
3.0000 mL | Freq: Four times a day (QID) | RESPIRATORY_TRACT | Status: DC
  Administered 2017-09-18 – 2017-09-19 (×5): 3 mL via RESPIRATORY_TRACT
  Filled 2017-09-18 (×5): qty 3

## 2017-09-18 MED ORDER — HYDROCODONE-ACETAMINOPHEN 5-325 MG PO TABS
1.0000 | ORAL_TABLET | ORAL | Status: DC | PRN
  Administered 2017-09-18 – 2017-09-21 (×10): 1 via ORAL
  Filled 2017-09-18 (×12): qty 1

## 2017-09-18 MED ORDER — BISACODYL 5 MG PO TBEC
10.0000 mg | DELAYED_RELEASE_TABLET | Freq: Once | ORAL | Status: AC
  Administered 2017-09-18: 10 mg via ORAL
  Filled 2017-09-18: qty 2

## 2017-09-18 MED ORDER — POTASSIUM CHLORIDE 10 MEQ/100ML IV SOLN
10.0000 meq | INTRAVENOUS | Status: AC
  Administered 2017-09-18 (×2): 10 meq via INTRAVENOUS
  Filled 2017-09-18: qty 100

## 2017-09-18 MED ORDER — IPRATROPIUM-ALBUTEROL 0.5-2.5 (3) MG/3ML IN SOLN: 3.0000 mL | Freq: Two times a day (BID) | RESPIRATORY_TRACT | Status: DC

## 2017-09-18 MED ORDER — SODIUM CHLORIDE 0.9 % IV SOLN
1.5000 g | Freq: Three times a day (TID) | INTRAVENOUS | Status: DC
  Administered 2017-09-18 – 2017-09-19 (×2): 1.5 g via INTRAVENOUS
  Filled 2017-09-18 (×3): qty 1.5

## 2017-09-18 MED ORDER — MORPHINE SULFATE (PF) 2 MG/ML IV SOLN
0.5000 mg | INTRAVENOUS | Status: DC | PRN
  Administered 2017-09-18 – 2017-09-24 (×15): 1 mg via INTRAVENOUS
  Filled 2017-09-18 (×15): qty 1

## 2017-09-18 MED ORDER — SODIUM CHLORIDE 0.9 % IV SOLN: INTRAVENOUS | Status: DC

## 2017-09-18 NOTE — Progress Notes (Signed)
  Echocardiogram 2D Echocardiogram has been performed.  Denise [name] 09/18/2017, 11:32 AM

## 2017-09-18 NOTE — Progress Notes (Addendum)
PROGRESS NOTE    Denise [name]  GSP:[id number] DOB: 1945-08-07 DOA: 09/16/2017 PCP: [name], NP    Brief Narrative: Denise [name] is a 72 y.o. female with history of COPD, hypertension, colon cancer status post resection, hyperparathyroidism scheduled for surgery next month presents to the ER with complaints of increasing exertional shortness of breath over the last 1 week with epigastric pain.  Patient states that she has been having more than usual shortness of breath denies any recent travel.  Denies any chest pain.  Has been having epigastric pain with one episode of vomiting.  Denies any diarrhea.  ED Course: In the ER chest x-ray shows pleural effusion and congestion and possible consolidation per the CAT scan.  CT abdomen pelvis done shows possible mass in the distal esophagus and also intra-and extrahepatic ductal dilation.  LFTs are normal.  Patient complains of stabbing abdominal pain radiating to the back.  Patient in the ER was given nebulizer treatment steroids Lasix for COPD versus CHF treatment.  And will need further work-up for the possible esophageal mass and epigastric pain.  In the ER patient was found to have urinary retention and was placed on Foley catheter.   Assessment & [name]:   Principal Problem:   Acute respiratory failure with hypoxia ([name]) Active Problems:   COPD GOLD II    Essential hypertension   Hyperparathyroidism, primary ([place])   History of colon cancer   Epigastric pain   Protein-calorie malnutrition, severe   Abnormal CT scan, gastrointestinal tract   Constipation   Other ascites   1-Acute Hypoxic respiratory Failure;  Differential COPD exacerbation, vs PNA, ? CHF bnp 300 chest x ray initially congestion.  Received IV lasix in the ED>  VQ scan negative.  ECHO; normal EF, diastolic dysfunction grade 1, - Pulmonary arteries: Systolic pressure was severely increased. PA peak pressure: 69 mm Hg (S). Continue with nebulizer.    Start Unasyn to cover for PNA.  Requiring less oxygen 6---today 4 L.  Negative 3 L.  Repeated chest x ray today; no pulmonary edema, hyperinflation.   Abdominal pain, lower quadrant;  Constipation.  To received suppository today.   ? Esophageal mass on CT;  GI following.  Esophagogram ordered.   ? Cholecystitis on MRCP;  GB sludge and wall thickening edema likely cholecystitis.  Focal narrowing of pancreatic duct  Will get HIDA scan.  Start Unasyn.  Dr Carlean Purl review MRCP with radiologist, they don't think biliary and pancreatic finding are real.   Urinary retention; foley catheter.  Will need voiding trial.   Primary hyperparathyroidism, hypercalcemia;  Parathyroidectomy set for 10/02/17 with Dr Rosendo Gros.   Hold IV fluids to avoid pulmonary edema.  If calcium continue to increase will need to start calcitonin   Hypokalemia; replete IV.   Elevated anion gap;  Check lactic acid.  Start antibiotics.    DVT prophylaxis: SCD Code Status: Full code.  Family Communication: care discussed with patient  Disposition [name]: home when stable. Remain in the hospital for further evalutation of abdominal pain. Start IV antibiotics.    Consultants:   GI   Procedures:  ECHO; - Left ventricle: The cavity size was normal. There was moderate   septal hypertrophy with otherwise mild concentric hypertrophy.   Systolic function was normal. The estimated ejection fraction was   in the range of 60% to 65%. There was dynamic obstruction at   rest, with a peak velocity of 189 cm/sec and a peak gradient of   14  mm Hg. Wall motion was normal; there were no regional wall   motion abnormalities. Doppler parameters are consistent with   abnormal left ventricular relaxation (grade 1 diastolic   dysfunction). - Aortic valve: Transvalvular velocity was within the normal range.   There was no stenosis. There was mild regurgitation. - Mitral valve: Transvalvular velocity was within the normal  range.   There was no evidence for stenosis. There was trivial   regurgitation. - Right ventricle: The cavity size was normal. Wall thickness was   normal. Systolic function was normal. - Atrial septum: No defect or patent foramen ovale was identified   by color flow Doppler. - Tricuspid valve: There was mild regurgitation. - Pulmonary arteries: Systolic pressure was severely increased. PA    peak pressure: 69 mm Hg (S).     Antimicrobials:  Unasyn 9-17   Subjective: patient complaining of abdominal pain, lower quadrant. Not eating well.  Had very small BM yesterday  She is breathing better, she does not use oxygen at home. She is currently on 4 L oxygen   Objective: Vitals:   09/17/17 2031 09/17/17 2208 09/18/17 0426 09/18/17 0721  BP: (!) 165/92  (!) 160/98   Pulse: 93 96 92 90  Resp:   19 18  Temp:   98.3 F (36.8 C)   TempSrc:   Oral   SpO2:  100% 100% 97%  Weight:      Height:        Intake/Output Summary (Last 24 hours) at 09/18/2017 0740 Last data filed at 09/18/2017 0453 Gross per 24 hour  Intake 290 ml  Output 408 ml  Net -118 ml   Filed Weights   09/16/17 1948 09/17/17 0344  Weight: 56.7 kg 57.2 kg    Examination:  General exam: Appears calm and comfortable  Respiratory system: Clear to auscultation. Respiratory effort normal. Cardiovascular system: S1 & S2 heard, RRR. No JVD, murmurs, rubs, gallops or clicks. No pedal edema. Gastrointestinal system: BS present, soft, lower quadrant tenderness.  Central nervous system: Alert and oriented. No focal neurological deficits. Extremities: Symmetric 5 x 5 power. Skin: No rashes, lesions or ulcers Psychiatry: Judgement and insight appear normal. Mood & affect appropriate.     Data Reviewed: I have personally reviewed following labs and imaging studies  CBC: Recent Labs  Lab 09/16/17 2005 09/17/17 0350 09/18/17 0511  WBC 8.7 7.0 8.4  NEUTROABS 7.5 6.3  --   HGB 14.1 14.5 14.7  HCT 44.0 44.1  45.5  MCV 87.6 85.3 87.2  PLT 212 227 583   Basic Metabolic Panel: Recent Labs  Lab 09/16/17 2005 09/17/17 0350 09/18/17 0511  NA 141 143 142  K 3.5 3.6 3.4*  CL 106 104 102  CO2 _0 GLUCOSE 139* 180* 105*  BUN _1 CREATININE 1.36* 1.00 1.05*  CALCIUM 10.6* 10.4* 11.0*   GFR: Estimated Creatinine Clearance: 43.7 mL/min (A) (by C-G formula based on SCr of 1.05 mg/dL (H)). Liver Function Tests: Recent Labs  Lab 09/16/17 2005 09/17/17 0350 09/18/17 0511  AST _2 ALT _3 ALKPHOS 111 116 89  BILITOT 0.7 1.0 1.2  PROT 6.9 7.1 6.1*  ALBUMIN 4.2 4.2 3.5   Recent Labs  Lab 09/16/17 2005  LIPASE 23   No results for input(s): AMMONIA in the last 168 hours. Coagulation Profile: No results for input(s): INR, PROTIME in the last 168 hours. Cardiac Enzymes: Recent Labs  Lab 09/17/17 0350 09/17/17 0940  09/17/17 1439  TROPONINI <0.03 <0.03 <0.03   BNP (last 3 results) No results for input(s): PROBNP in the last 8760 hours. HbA1C: No results for input(s): HGBA1C in the last 72 hours. CBG: Recent Labs  Lab 09/17/17 1320 09/17/17 1625 09/17/17 2107 09/18/17 0004 09/18/17 0421  GLUCAP 150* 138* 142* 150* 107*   Lipid Profile: No results for input(s): CHOL, HDL, LDLCALC, TRIG, CHOLHDL, LDLDIRECT in the last 72 hours. Thyroid Function Tests: No results for input(s): TSH, T4TOTAL, FREET4, T3FREE, THYROIDAB in the last 72 hours. Anemia Panel: No results for input(s): VITAMINB12, FOLATE, FERRITIN, TIBC, IRON, RETICCTPCT in the last 72 hours. Sepsis Labs: Recent Labs  Lab 09/17/17 0350  PROCALCITON <0.10    No results found for this or any previous visit (from the past 240 hour(s)).       Radiology Studies: Ct Abdomen Pelvis W Contrast  Result Date: 09/17/2017 CLINICAL DATA:  Acute generalized abdominal pain. Vomiting. History of colon cancer resected 1989. EXAM: CT ABDOMEN AND PELVIS WITH CONTRAST TECHNIQUE: Multidetector CT imaging  of the abdomen and pelvis was performed using the standard protocol following bolus administration of intravenous contrast. CONTRAST:  143m OMNIPAQUE IOHEXOL 300 MG/ML  SOLN COMPARISON:  06/14/2017 FINDINGS: Lower chest: Small bilateral pleural effusions with basilar atelectasis or consolidation. As seen previously there is an abnormal appearance of the distal esophagus and EG junction with poor definition of the esophageal wall and edema around the distal esophagus. Lymphadenopathy of the EG junction. Appearance is suspicious for esophageal neoplasm. If not previously evaluated, endoscopy may be of use. Hepatobiliary: No focal liver lesions. Gallbladder is distended without wall thickening or inflammatory changes. Mild intra and extrahepatic bile duct dilatation without cause identified. Correlation with liver function studies is recommended. Could consider follow-up with elective MRCP. Pancreas: Unremarkable. No pancreatic ductal dilatation or surrounding inflammatory changes. Spleen: Normal in size without focal abnormality. Adrenals/Urinary Tract: Bilateral renal cysts. Nephrograms are symmetrical. No hydronephrosis or hydroureter. No adrenal gland nodules. Bladder is partially decompressed with a Foley catheter. Stomach/Bowel: Diffusely stool-filled colon. Stomach, small bowel, and colon are not abnormally distended. Mesenteric edema particularly in the pelvis. No obvious wall thickening although under distention would limit evaluation of bowel wall. Appendix is normal. Vascular/Lymphatic: Diffuse calcification of the abdominal aorta. No significant retroperitoneal lymphadenopathy. Reproductive: Status post hysterectomy. No adnexal masses. Other: Small amount of free fluid in the upper abdomen and pelvis. No free air. Surgical scarring in the anterior abdominal wall. Musculoskeletal: No destructive bone lesions. IMPRESSION: 1. Small bilateral pleural effusions with basilar atelectasis or consolidation. 2.  Abnormal appearance of the distal esophagus and EG junction with poor definition of the esophageal wall and edema around the distal esophagus. Lymphadenopathy in the junction. Appearance is suspicious for esophageal neoplasm. Correlation with endoscopy is recommended. 3. Mild intra and extrahepatic bile duct dilatation without cause identified. Correlation with liver function studies is recommended. Consider follow-up with elective MRCP. 4. Mesenteric edema particularly in the pelvis. This could represent secondary changes due to enteritis. No evidence of bowel obstruction but bowel wall is not completely evaluated due to under distention. 5. Small amount of free fluid in the abdomen and pelvis. Aortic Atherosclerosis (ICD10-I70.0). Electronically Signed   By: WLucienne CapersM.D.   On: 09/17/2017 01:35   Mr Abdomen Mrcp Wo Contrast  Result Date: 09/17/2017 CLINICAL DATA:  Suspect pancreatitis.  Atypical presentation. EXAM: MRI ABDOMEN WITHOUT CONTRAST  (INCLUDING MRCP) TECHNIQUE: Multiplanar multisequence MR imaging of the abdomen was performed. Heavily T2-weighted images of  the biliary and pancreatic ducts were obtained, and three-dimensional MRCP images were rendered by post processing. COMPARISON:  CT abdomen and pelvis 09/17/2017 FINDINGS: Lower chest: Small bilateral pleural effusions with basilar atelectasis. Hepatobiliary: No focal lesions are demonstrated in the liver. The gallbladder is distended with layering of sludge in the gallbladder, mild gallbladder wall thickening, and with edema around the gallbladder and extending up along the liver edge. This probably reflects acute cholecystitis. Bile ducts are mildly dilated with evidence of filling defects in the mid and distal common bile duct suggesting common duct stones. Pancreas: Pancreatic duct is not significantly dilated but there is a persistent area of narrowing demonstrated at the body of the pancreas. This could represent inflammatory  stricture or an intraductal neoplasm. Pancreatic parenchyma appears homogeneous. Spleen: Spleen size is normal. Several accessory spleens are present. No focal lesions. Adrenals/Urinary Tract: Multiple bilateral renal cysts. No hydronephrosis or hydroureter. Stomach/Bowel: Visualized stomach, small, and large bowel are not abnormally distended. Vascular/Lymphatic: Normal caliber abdominal aorta and inferior vena cava. No significant lymphadenopathy. Other:  None. Musculoskeletal: Lumbar scoliosis convex towards the right. Mostly fatty replaced marrow. No expansile bone lesions are appreciated. IMPRESSION: 1. Probable cholecystitis and cholelithiasis as above. 2. Intra and extrahepatic bile duct dilatation. Probable choledocholithiasis. 3. Focal area of persistent narrowing of the pancreatic duct at the body. No specific mass identified. This could represent inflammatory stricture or intraductal neoplasm. 4. Small amount of free fluid in the upper abdomen, likely reactive. Electronically Signed   By: Lucienne Capers M.D.   On: 09/17/2017 06:26   Mr 3d Recon At Scanner  Result Date: 09/17/2017 CLINICAL DATA:  Suspect pancreatitis.  Atypical presentation. EXAM: MRI ABDOMEN WITHOUT CONTRAST  (INCLUDING MRCP) TECHNIQUE: Multiplanar multisequence MR imaging of the abdomen was performed. Heavily T2-weighted images of the biliary and pancreatic ducts were obtained, and three-dimensional MRCP images were rendered by post processing. COMPARISON:  CT abdomen and pelvis 09/17/2017 FINDINGS: Lower chest: Small bilateral pleural effusions with basilar atelectasis. Hepatobiliary: No focal lesions are demonstrated in the liver. The gallbladder is distended with layering of sludge in the gallbladder, mild gallbladder wall thickening, and with edema around the gallbladder and extending up along the liver edge. This probably reflects acute cholecystitis. Bile ducts are mildly dilated with evidence of filling defects in the mid  and distal common bile duct suggesting common duct stones. Pancreas: Pancreatic duct is not significantly dilated but there is a persistent area of narrowing demonstrated at the body of the pancreas. This could represent inflammatory stricture or an intraductal neoplasm. Pancreatic parenchyma appears homogeneous. Spleen: Spleen size is normal. Several accessory spleens are present. No focal lesions. Adrenals/Urinary Tract: Multiple bilateral renal cysts. No hydronephrosis or hydroureter. Stomach/Bowel: Visualized stomach, small, and large bowel are not abnormally distended. Vascular/Lymphatic: Normal caliber abdominal aorta and inferior vena cava. No significant lymphadenopathy. Other:  None. Musculoskeletal: Lumbar scoliosis convex towards the right. Mostly fatty replaced marrow. No expansile bone lesions are appreciated. IMPRESSION: 1. Probable cholecystitis and cholelithiasis as above. 2. Intra and extrahepatic bile duct dilatation. Probable choledocholithiasis. 3. Focal area of persistent narrowing of the pancreatic duct at the body. No specific mass identified. This could represent inflammatory stricture or intraductal neoplasm. 4. Small amount of free fluid in the upper abdomen, likely reactive. Electronically Signed   By: Lucienne Capers M.D.   On: 09/17/2017 06:26   Nm Pulmonary Perf And Vent  Result Date: 09/17/2017 CLINICAL DATA:  72 year old female with shortness of breath. Small bilateral pleural effusions on  CT Abdomen and Pelvis earlier today. EXAM: NUCLEAR MEDICINE VENTILATION - PERFUSION LUNG SCAN TECHNIQUE: Ventilation images were obtained in multiple projections using inhaled aerosol Tc-34mDTPA. Perfusion images were obtained in multiple projections after intravenous injection of Tc-947mAA. RADIOPHARMACEUTICALS:  32.2 mCi of Tc-9991mPA aerosol inhalation and 4.2 mCi Tc99m14m IV COMPARISON:  CT Abdomen and Pelvis 0119 hours today. AP chest x-ray 09/16/2017. FINDINGS: Ventilation: Mild  artifactual clumping of the ventilation tracer, but relatively homogeneous tracer distribution. No convincing ventilation defect. Perfusion: Homogeneous perfusion radiotracer distribution in both lungs. No perfusion defect. IMPRESSION: No perfusion defect, no evidence of pulmonary embolus. Electronically Signed   By: H  HGenevie Ann.   On: 09/17/2017 15:53   Dg Abdomen Acute W/chest  Result Date: 09/16/2017 CLINICAL DATA:  Patient with abdominal pain. EXAM: DG ABDOMEN ACUTE W/ 1V CHEST COMPARISON:  Chest radiograph 06/02/2016 FINDINGS: Monitoring leads overlie the patient. Cardiac contours upper limits of normal. Aortic atherosclerosis. Bilateral interstitial pulmonary opacities. Small left pleural effusion. Gas is demonstrated within nondilated loops of bowel throughout the abdomen. Stool throughout the colon. Lumbar spine degenerative changes. IMPRESSION: Cardiomegaly with pulmonary vascular redistribution and mild interstitial edema. Small left effusion. Nonobstructed bowel gas pattern. Stool throughout the colon. Electronically Signed   By: DrewLovey Newcomer.   On: 09/16/2017 21:13        Scheduled Meds: . budesonide (PULMICORT) nebulizer solution  0.25 mg Nebulization BID  . chlorhexidine  15 mL Mouth Rinse BID  . feeding supplement (GLUCERNA SHAKE)  237 mL Oral TID BM  . Influenza vac split quadrivalent PF  0.5 mL Intramuscular Tomorrow-1000  . insulin aspart  0-9 Units Subcutaneous Q4H  . ipratropium-albuterol  3 mL Nebulization QID  . mouth rinse  15 mL Mouth Rinse q12n4p  . mirtazapine  15 mg Oral QHS  . multivitamin with minerals  1 tablet Oral Daily  . pantoprazole  40 mg Oral Q0600   Continuous Infusions: . potassium chloride       LOS: 1 day    Time spent: 35 minutes     BelkElmarie Shiley [facility] Pager [phone] 7PM-7AM, please contact night-coverage [email or web address] Password [email or web address] 09/18/2017, 7:40 AM

## 2017-09-18 NOTE — Plan of Care (Signed)
  Problem: Education: ?Goal: Knowledge of General Education information will improve ?Description: Including pain rating scale, medication(s)/side effects and non-pharmacologic comfort measures ?Outcome: Progressing ?  ?Problem: Health Behavior/Discharge Planning: ?Goal: Ability to manage health-related needs will improve ?Outcome: Progressing ?  ?Problem: Clinical Measurements: ?Goal: Ability to maintain clinical measurements within normal limits will improve ?Outcome: Progressing ?Goal: Will remain free from infection ?Outcome: Progressing ?Goal: Diagnostic test results will improve ?Outcome: Progressing ?Goal: Respiratory complications will improve ?Outcome: Progressing ?Goal: Cardiovascular complication will be avoided ?Outcome: Progressing ?  ?Problem: Activity: ?Goal: Risk for activity intolerance will decrease ?Outcome: Progressing ?  ?Problem: Nutrition: ?Goal: Adequate nutrition will be maintained ?Outcome: Progressing ?  ?Problem: Coping: ?Goal: Level of anxiety will decrease ?Outcome: Progressing ?  ?Problem: Elimination: ?Goal: Will not experience complications related to bowel motility ?Outcome: Progressing ?Goal: Will not experience complications related to urinary retention ?Outcome: Progressing ?  ?Problem: Pain Managment: ?Goal: General experience of comfort will improve ?Outcome: Progressing ?  ?Problem: Safety: ?Goal: Ability to remain free from injury will improve ?Outcome: Progressing ?  ?Problem: Skin Integrity: ?Goal: Risk for impaired skin integrity will decrease ?Outcome: Progressing ?  ?Problem: Education: ?Goal: Knowledge of General Education information will improve ?Description: Including pain rating scale, medication(s)/side effects and non-pharmacologic comfort measures ?Outcome: Progressing ?  ?Problem: Health Behavior/Discharge Planning: ?Goal: Ability to manage health-related needs will improve ?Outcome: Progressing ?  ?Problem: Clinical Measurements: ?Goal: Ability to maintain  clinical measurements within normal limits will improve ?Outcome: Progressing ?Goal: Will remain free from infection ?Outcome: Progressing ?Goal: Diagnostic test results will improve ?Outcome: Progressing ?Goal: Respiratory complications will improve ?Outcome: Progressing ?Goal: Cardiovascular complication will be avoided ?Outcome: Progressing ?  ?Problem: Activity: ?Goal: Risk for activity intolerance will decrease ?Outcome: Progressing ?  ?Problem: Nutrition: ?Goal: Adequate nutrition will be maintained ?Outcome: Progressing ?  ?Problem: Coping: ?Goal: Level of anxiety will decrease ?Outcome: Progressing ?  ?Problem: Elimination: ?Goal: Will not experience complications related to bowel motility ?Outcome: Progressing ?Goal: Will not experience complications related to urinary retention ?Outcome: Progressing ?  ?Problem: Pain Managment: ?Goal: General experience of comfort will improve ?Outcome: Progressing ?  ?Problem: Safety: ?Goal: Ability to remain free from injury will improve ?Outcome: Progressing ?  ?Problem: Skin Integrity: ?Goal: Risk for impaired skin integrity will decrease ?Outcome: Progressing ?  ?

## 2017-09-18 NOTE — Progress Notes (Addendum)
Daily Rounding Note  09/18/2017, 8:31 AM  LOS: 1 day   ASSESMENT:   *  Abnormal esophagus on CT.  EGD done in 06/2017 revealed normal esophagus and fundal gastritis.   Started on daily Protonix 9/16, was not using acid controlling meds at home.   Pt recently using 2 Aleve daily for ~ 1 week.  Esophagram in progress.      *  Mesenteric edema in pelvis.  ? Enteritis.   Pt has constipation likely related to hypercalcemia.     *   Hypokalemia.  Mild.    *  Dyspnea.  Pleural effusions.  COPD.  Elevated D dimer, Normal VQ scan.  *  Primary hyperparathyroidism, hypercalcemia.  Parathyroidectomy set for 10/02/17  *  Colon cancer, partial colectomy in 1988, up to date on surveillance colonoscopy, adenoma removed in 2015.  Repeat study due 02/2018.    PLAN   *  Dulcolax po after returns from esophagram.      Azucena Freed  09/18/2017, 8:31 AM Phone [place] Attending   I have taken an interval history, reviewed the chart and examined the patient. I agree with the Advanced Practitioner's note, impression and recommendations.   Additions:  She has bad pulmonary artery htn - I ? Some RHF and ascites from that I do not think she needs a HIDA and have cancelled and stopped Unasyn. I think the pericholecystic edema is from RHF and not intra-abdominal infection - no fever, no leukocytosis and not tender in RUQ and her back pain is very low not infrascapular LFT's all NL also amd MRI findings in bile duct   She needs CHF consult - to eval and Tx pulm htn and diastolic dysfx - AM CONCERNED ABOUT CLEARANCE FOR UPCOMING PARATHYROID SURGERY AND LONGER-TERM TX NEEDS AS WELL AS ACUTE ISSUES  We will call in AM unless TRH does first  SMOG enema to treat constipation - hypercalcemia has been causing and I think may have referred back pain and lower abd pain from that  Confusing clinical scenario no doubt - but  really do not suspect she has a bad gallbladder    Gatha Mayer, MD, [facility] 09/18/2017 6:21 PM [phone]    SUBJECTIVE:   Chief complaint:  Abdominal pain in lower abdomen moving up left side and into her back.   Nausea but no heaving, no vomiting.  Some stool  Output after tap water enema yesterday.   Overall the dyspnea is better Esophagram ordered but not performed yet.    OBJECTIVE:         Vital signs in last 24 hours:    Temp:  [97.4 F (36.3 C)-98.3 F (36.8 C)] 98.3 F (36.8 C) (09/17 0426) Pulse Rate:  [84-102] 90 (09/17 0721) Resp:  [18-20] 18 (09/17 0721) BP: (142-192)/(88-122) 160/98 (09/17 0426) SpO2:  [84 %-100 %] 97 % (09/17 0721) Last BM Date: 09/17/17 Filed Weights   09/16/17 1948 09/17/17 0344  Weight: 56.7 kg 57.2 kg   General: thin, frail, chronically ill and uncomfortable lookiing   Heart: RRR Chest: clear bil.  No cough or dyspnea Abdomen: soft, minimal tenderness.  Active BS  Extremities: no CCE Neuro/Psych:  Oriented x 3.  Moaning a bit.  Moves all 4 limbs, no tremors.    Lab Results: Recent Labs    09/16/17 2005 09/17/17 0350 09/18/17 0511  WBC 8.7 7.0 8.4  HGB 14.1  14.5 14.7  HCT 44.0 44.1 45.5  PLT 212 227 260   BMET Recent Labs    09/16/17 2005 09/17/17 0350 09/18/17 0511  NA 141 143 142  K 3.5 3.6 3.4*  CL 106 104 102  CO2 _0 GLUCOSE 139* 180* 105*  BUN _1 CREATININE 1.36* 1.00 1.05*  CALCIUM 10.6* 10.4* 11.0*   LFT Recent Labs    09/16/17 2005 09/17/17 0350 09/18/17 0511  PROT 6.9 7.1 6.1*  ALBUMIN 4.2 4.2 3.5  AST _2 ALT _3 ALKPHOS 111 116 89  BILITOT 0.7 1.0 1.2  BILIDIR  --  0.2  --   IBILI  --  0.8  --

## 2017-09-18 NOTE — Progress Notes (Signed)
Patient's level of discomfort has appeared to lessen today with keeping her pain meds  On schedule as ordered.  Without, the stomach discomfort begins and radiates to back

## 2017-09-18 NOTE — Progress Notes (Signed)
Pharmacy Antibiotic Note  Denise [name] is a 72 y.o. female admitted on 09/16/2017 with Intra-abdominal infection.  Pharmacy has been consulted for Unasyn dosing.  D: Cholelithiasis with ?cholcystitis. Afebrile. WBC 8.4. Scr WNL. No micro data currently.  Zosyn 9/16>9/17 Unasyn 9/17>  Plan: Change Zosyn to Unasyn 1.5g IV q  8hrs   Height: _0  (167.6 cm) Weight: (PT refused; "get it later") IBW/kg (Calculated) : 59.3  Temp (24hrs), Avg:97.7 F (36.5 C), Min:97.4 F (36.3 C), Max:98.3 F (36.8 C)  Recent Labs  Lab 09/16/17 2005 09/17/17 0350 09/18/17 0511  WBC 8.7 7.0 8.4  CREATININE 1.36* 1.00 1.05*    Estimated Creatinine Clearance: 43.7 mL/min (A) (by C-G formula based on SCr of 1.05 mg/dL (H)).    No Known Allergies  Thank you for allowing pharmacy to be a part of this patient's care.   Francely Craw S. Alford Highland, PharmD, [place] Clinical Staff Pharmacist Pager [phone]  Eilene Ghazi [facility] 09/18/2017 11:12 AM

## 2017-09-19 ENCOUNTER — Encounter (HOSPITAL_COMMUNITY): Payer: Self-pay | Admitting: Internal Medicine

## 2017-09-19 ENCOUNTER — Inpatient Hospital Stay (HOSPITAL_COMMUNITY): Payer: Medicare HMO

## 2017-09-19 DIAGNOSIS — R339 Retention of urine, unspecified: Secondary | ICD-10-CM

## 2017-09-19 DIAGNOSIS — K5909 Other constipation: Secondary | ICD-10-CM

## 2017-09-19 DIAGNOSIS — I10 Essential (primary) hypertension: Secondary | ICD-10-CM

## 2017-09-19 DIAGNOSIS — I272 Pulmonary hypertension, unspecified: Secondary | ICD-10-CM | POA: Diagnosis not present

## 2017-09-19 DIAGNOSIS — E43 Unspecified severe protein-calorie malnutrition: Secondary | ICD-10-CM | POA: Diagnosis not present

## 2017-09-19 DIAGNOSIS — J439 Emphysema, unspecified: Secondary | ICD-10-CM | POA: Diagnosis not present

## 2017-09-19 LAB — GLUCOSE, CAPILLARY
GLUCOSE-CAPILLARY: 118 mg/dL — AB (ref 70–99)
GLUCOSE-CAPILLARY: 123 mg/dL — AB (ref 70–99)
GLUCOSE-CAPILLARY: 154 mg/dL — AB (ref 70–99)
GLUCOSE-CAPILLARY: 105 mg/dL — AB (ref 70–99)
Glucose-Capillary: 122 mg/dL — ABNORMAL HIGH (ref 70–99)
Glucose-Capillary: 130 mg/dL — ABNORMAL HIGH (ref 70–99)

## 2017-09-19 LAB — COMPREHENSIVE METABOLIC PANEL
ALBUMIN: 3.6 g/dL (ref 3.5–5.0)
ALK PHOS: 92 U/L (ref 38–126)
ALT: 20 U/L (ref 0–44)
AST: 19 U/L (ref 15–41)
Anion gap: 11 (ref 5–15)
BILIRUBIN TOTAL: 1 mg/dL (ref 0.3–1.2)
BUN: 23 mg/dL (ref 8–23)
CALCIUM: 11.4 mg/dL — AB (ref 8.9–10.3)
CO2: 28 mmol/L (ref 22–32)
Chloride: 102 mmol/L (ref 98–111)
Creatinine, Ser: 1.07 mg/dL — ABNORMAL HIGH (ref 0.44–1.00)
GFR calc Af Amer: 59 mL/min — ABNORMAL LOW (ref >60)
GFR, EST NON AFRICAN AMERICAN: 51 mL/min — AB (ref >60)
GLUCOSE: 116 mg/dL — AB (ref 70–99)
Potassium: 3.8 mmol/L (ref 3.5–5.1)
Sodium: 141 mmol/L (ref 135–145)
TOTAL PROTEIN: 6.3 g/dL — AB (ref 6.5–8.1)

## 2017-09-19 LAB — CBC
HCT: 51 % — ABNORMAL HIGH (ref 36.0–46.0)
HEMOGLOBIN: 16.1 g/dL — AB (ref 12.0–15.0)
MCH: 27.9 pg (ref 26.0–34.0)
MCHC: 31.6 g/dL (ref 30.0–36.0)
MCV: 88.4 fL (ref 78.0–100.0)
Platelets: 270 10*3/uL (ref 150–400)
RBC: 5.77 MIL/uL — ABNORMAL HIGH (ref 3.87–5.11)
RDW: 15.4 % (ref 11.5–15.5)
WBC: 7.9 10*3/uL (ref 4.0–10.5)

## 2017-09-19 MED ORDER — ASPIRIN 81 MG PO CHEW
81.0000 mg | CHEWABLE_TABLET | ORAL | Status: AC
  Administered 2017-09-20: 81 mg via ORAL
  Filled 2017-09-19: qty 1

## 2017-09-19 MED ORDER — METOPROLOL TARTRATE 25 MG PO TABS
25.0000 mg | ORAL_TABLET | Freq: Two times a day (BID) | ORAL | Status: DC
  Administered 2017-09-19 – 2017-09-22 (×7): 25 mg via ORAL
  Filled 2017-09-19 (×7): qty 1

## 2017-09-19 MED ORDER — CALCITONIN (SALMON) 200 UNIT/ACT NA SOLN
1.0000 | Freq: Every day | NASAL | Status: DC
  Administered 2017-09-19 – 2017-09-24 (×6): 1 via NASAL
  Filled 2017-09-19: qty 3.7

## 2017-09-19 MED ORDER — PRO-STAT SUGAR FREE PO LIQD
30.0000 mL | Freq: Two times a day (BID) | ORAL | Status: DC
  Administered 2017-09-19 – 2017-09-23 (×7): 30 mL via ORAL
  Filled 2017-09-19 (×10): qty 30

## 2017-09-19 MED ORDER — SENNA 8.6 MG PO TABS
2.0000 | ORAL_TABLET | Freq: Every day | ORAL | Status: DC
  Administered 2017-09-19 – 2017-09-23 (×5): 17.2 mg via ORAL
  Filled 2017-09-19 (×5): qty 2

## 2017-09-19 MED ORDER — SODIUM CHLORIDE 0.9 % IV SOLN: 250.0000 mL | INTRAVENOUS | Status: DC | PRN

## 2017-09-19 MED ORDER — SODIUM CHLORIDE 0.9 % IV SOLN
INTRAVENOUS | Status: DC
  Administered 2017-09-20: 12:00:00 via INTRAVENOUS

## 2017-09-19 MED ORDER — SODIUM CHLORIDE 0.9% FLUSH
3.0000 mL | INTRAVENOUS | Status: DC | PRN
  Administered 2017-09-19: 3 mL via INTRAVENOUS
  Filled 2017-09-19: qty 3

## 2017-09-19 MED ORDER — SODIUM CHLORIDE 0.9% FLUSH
3.0000 mL | Freq: Two times a day (BID) | INTRAVENOUS | Status: DC
  Administered 2017-09-20: 3 mL via INTRAVENOUS

## 2017-09-19 MED ORDER — SORBITOL 70 % SOLN
960.0000 mL | TOPICAL_OIL | Freq: Once | ORAL | Status: AC
  Administered 2017-09-19: 960 mL via RECTAL
  Filled 2017-09-19 (×2): qty 473

## 2017-09-19 NOTE — Progress Notes (Addendum)
PROGRESS NOTE    Denise [name] Plan  BVQ:[id number] DOB: 1945-09-24 DOA: 09/16/2017 PCP: [name], NP    Brief Narrative: [name]. Bashor is a 72 y.o. female with history of COPD, hypertension, colon cancer status post resection, hyperparathyroidism scheduled for surgery next month presented to the ER with complaints of increasing exertional shortness of breath over the last 1 week with epigastric pain.   ED Course: In the ER chest x-ray shows pleural effusion and congestion and possible consolidation per the CAT scan.  CT abdomen pelvis done shows possible mass in the distal esophagus and also intra-and extrahepatic ductal dilation.  LFTs are normal.  Patient in the ER was given nebulizer treatment steroids Lasix for COPD versus CHF treatment.    Admitted for further work-up for the possible esophageal mass and epigastric pain.  In the ER patient was found to have urinary retention and was placed on Foley catheter.  [facility] and cardiology consulting.   Assessment & Plan:   Principal Problem:   Acute respiratory failure with hypoxia ([name]) Active Problems:   COPD GOLD II    Essential hypertension   Hyperparathyroidism, primary ([place])   History of colon cancer   Epigastric pain   Protein-calorie malnutrition, severe   Abnormal CT scan, gastrointestinal tract   Constipation   Other ascites   Acute Hypoxic respiratory Failure;  Unclear etiology.  Former smoker.  TTE 9/17 showed normal LVEF, grade 1 diastolic dysfunction and pulmonary hypertension with PA peak pressure 69 mmHg.  VQ scan showed no perfusion defect.  Low index of concern for pneumonia, Unasyn discontinued.  No clinical bronchospasm/COPD exacerbation.  Treated with IV Lasix, -3.7 L thus far and weight down by approximately 5 pounds since admission.  As per GI, they doubt that her GI symptoms are primary GI and suspect more due to pulmonary hypertension/cor pulmonale and hence recommended cardiology  consultation.  Cardiology input appreciated and plan for right heart cath 9/19.  Currently saturating in the high 90s on 3 L/min, wean off oxygen as tolerated.  Abdominal pain/abnormal CT abdomen findings;  Unclear etiology.  [facility] following and their input appreciated.  Abnormal esophagus noted on CT.  Esophagogram 9/18 showed esophageal dysmotility, distal esophagus appears slightly patulous and there may be an associated diverticulum.  EGD 06/2017 had shown normal esophagus and fundal gastritis.  Now on PPI.  As per GI, abnormal CT abdomen findings (mesenteric edema, pericholecystic edema, mild intra-/extrahepatic ductal dilatation, focal PD narrowing per CT, MRCP) may be due to right heart failure and do not represent cholecystitis.  Other findings felt to be artifact per Dr. Celesta Aver discussion with radiology.  GI recommended cardiology input.  Unasyn discontinued.  Patient had smog enema with small output 9/18.  Constipation: Status post smog enema 9/18 with small output.  Continue aggressive bowel regimen.  Complicated by hypercalcemia.  ? Esophageal mass on CT;  Evaluation and management as per GI.  Please see above.  On PPI.  Urinary retention; foley catheter.  Will need voiding trial.   Primary hyperparathyroidism, hypercalcemia;  Parathyroidectomy set for 10/02/17 with Dr Rosendo Gros.   Hold IV fluids to avoid pulmonary edema.  Start calcitonin.?  Consider bisphosphonates.  Hypokalemia; replaced.  Elevated anion gap;  Resolved.  Essential hypertension:  Continue Toprol.  PRN IV hydralazine.   DVT prophylaxis: SCD Code Status: Full code.  Family Communication: None at bedside. Disposition Plan: DC home pending clinical improvement.   Consultants:   GI  Cardiology   Procedures:  ECHO; - Left ventricle: The cavity size was normal. There was moderate   septal hypertrophy with otherwise mild concentric hypertrophy.   Systolic function was normal. The estimated ejection  fraction was   in the range of 60% to 65%. There was dynamic obstruction at   rest, with a peak velocity of 189 cm/sec and a peak gradient of   14 mm Hg. Wall motion was normal; there were no regional wall   motion abnormalities. Doppler parameters are consistent with   abnormal left ventricular relaxation (grade 1 diastolic   dysfunction). - Aortic valve: Transvalvular velocity was within the normal range.   There was no stenosis. There was mild regurgitation. - Mitral valve: Transvalvular velocity was within the normal range.   There was no evidence for stenosis. There was trivial   regurgitation. - Right ventricle: The cavity size was normal. Wall thickness was   normal. Systolic function was normal. - Atrial septum: No defect or patent foramen ovale was identified   by color flow Doppler. - Tricuspid valve: There was mild regurgitation. - Pulmonary arteries: Systolic pressure was severely increased. PA    peak pressure: 69 mm Hg (S).     Antimicrobials:  Unasyn 9-17-discontinued   Subjective: Reports ongoing lower abdominal pain with some radiation to back.  As per RN, small BM after smog enema today.  Denies chest pain or dyspnea.  Objective: Vitals:   09/19/17 1036 09/19/17 1039 09/19/17 1245 09/19/17 1253  BP: (!) 180/108 (!) 166/105  (!) 155/82  Pulse: 92 87  82  Resp:    18  Temp:    (!) 97.5 F (36.4 C)  TempSrc:    Oral  SpO2: 99% 100% 99% 99%  Weight:      Height:        Intake/Output Summary (Last 24 hours) at 09/19/2017 1840 Last data filed at 09/19/2017 0600 Gross per 24 hour  Intake -  Output 550 ml  Net -550 ml   Filed Weights   09/16/17 1948 09/17/17 0344 09/19/17 0509  Weight: 56.7 kg 57.2 kg 54.3 kg    Examination:  General exam: Pleasant elderly female, moderately built, frail and thinly nourished. Respiratory system: Clear to auscultation. Respiratory effort normal. Cardiovascular system: S1 & S2 heard, RRR. No JVD, murmurs, rubs,  gallops or clicks. No pedal edema.  Telemetry personally reviewed: Sinus rhythm. Gastrointestinal system: Abdomen is nondistended, soft.  Mild periumbilical tenderness without rigidity, guarding or rebound.  Normal bowel sounds heard. Central nervous system: Alert and oriented. No focal neurological deficits. Extremities: Symmetric 5 x 5 power. Skin: No rashes, lesions or ulcers Psychiatry: Judgement and insight appear normal. Mood & affect appropriate.     Data Reviewed: I have personally reviewed following labs and imaging studies  CBC: Recent Labs  Lab 09/16/17 2005 09/17/17 0350 09/18/17 0511 09/19/17 0627  WBC 8.7 7.0 8.4 7.9  NEUTROABS 7.5 6.3  --   --   HGB 14.1 14.5 14.7 16.1*  HCT 44.0 44.1 45.5 51.0*  MCV 87.6 85.3 87.2 88.4  PLT 212 227 260 038   Basic Metabolic Panel: Recent Labs  Lab 09/16/17 2005 09/17/17 0350 09/18/17 0511 09/19/17 0627  NA 141 143 142 141  K 3.5 3.6 3.4* 3.8  CL 106 104 102 102  CO2 _0 GLUCOSE 139* 180* 105* 116*  BUN _1 CREATININE 1.36* 1.00 1.05* 1.07*  CALCIUM 10.6* 10.4* 11.0* 11.4*   GFR: Estimated Creatinine Clearance: 40.7 mL/min (  A) (by C-G formula based on SCr of 1.07 mg/dL (H)). Liver Function Tests: Recent Labs  Lab 09/16/17 2005 09/17/17 0350 09/18/17 0511 09/19/17 0627  AST _0 ALT _1 ALKPHOS 111 116 89 92  BILITOT 0.7 1.0 1.2 1.0  PROT 6.9 7.1 6.1* 6.3*  ALBUMIN 4.2 4.2 3.5 3.6   Recent Labs  Lab 09/16/17 2005  LIPASE 23   Cardiac Enzymes: Recent Labs  Lab 09/17/17 0350 09/17/17 0844 09/17/17 1439  TROPONINI <0.03 <0.03 <0.03   CBG: Recent Labs  Lab 09/19/17 0020 09/19/17 0505 09/19/17 0754 09/19/17 1250 09/19/17 1754  GLUCAP 130* 118* 122* 123* 154*   Sepsis Labs: Recent Labs  Lab 09/17/17 0350 09/18/17 1152 09/18/17 1401  PROCALCITON <0.10  --   --   LATICACIDVEN  --  1.0 0.9    No results found for this or any previous visit (from the past  240 hour(s)).       Radiology Studies: Dg Chest 2 View  Result Date: 09/18/2017 CLINICAL DATA:  Short of breath history of COPD EXAM: CHEST - 2 VIEW COMPARISON:  09/16/2017, 06/02/2016 FINDINGS: Hyperinflation with emphysematous disease. No focal consolidation or pleural effusion. Stable cardiomediastinal silhouette with aortic atherosclerosis. No pneumothorax. IMPRESSION: No active cardiopulmonary disease. Hyperinflation and emphysematous disease. Electronically Signed   By: Donavan Foil M.D.   On: 09/18/2017 14:52   Dg Esophagus  Result Date: 09/19/2017 CLINICAL DATA:  Epigastric pain, abnormal CT scan. EXAM: ESOPHOGRAM/BARIUM SWALLOW TECHNIQUE: Single contrast examination was performed using  thin barium. FLUOROSCOPY TIME:  Fluoroscopy Time:  2 minutes 0 seconds. Radiation Exposure Index (if provided by the fluoroscopic device): Number of Acquired Spot Images: 0 COMPARISON:  CT abdomen pelvis 09/17/2017. FINDINGS: There is poor esophageal motility. No esophageal fold thickening, stricture or obstruction. The distal esophagus, at the gastroesophageal junction, appears somewhat patulous. There may be an associated diverticulum. Esophagus is otherwise unremarkable. IMPRESSION: 1. Esophageal dysmotility. 2. Distal esophagus appears slightly patulous and there may be an associated diverticulum. Electronically Signed   By: Lorin Picket M.D.   On: 09/19/2017 12:12        Scheduled Meds: . [START ON 09/20/2017] aspirin  81 mg Oral Pre-Cath  . budesonide (PULMICORT) nebulizer solution  0.25 mg Nebulization BID  . chlorhexidine  15 mL Mouth Rinse BID  . feeding supplement (GLUCERNA SHAKE)  237 mL Oral TID BM  . feeding supplement (PRO-STAT SUGAR FREE 64)  30 mL Oral BID  . Influenza vac split quadrivalent PF  0.5 mL Intramuscular Tomorrow-1000  . insulin aspart  0-9 Units Subcutaneous Q4H  . ipratropium-albuterol  3 mL Nebulization QID  . mouth rinse  15 mL Mouth Rinse q12n4p  . metoprolol  tartrate  25 mg Oral BID  . mirtazapine  15 mg Oral QHS  . multivitamin with minerals  1 tablet Oral Daily  . pantoprazole  40 mg Oral Q0600  . sodium chloride flush  3 mL Intravenous Q12H   Continuous Infusions: . sodium chloride    . sodium chloride       LOS: 2 days    Time spent: 35 minutes    Vernell Leep, MD, FACP, [facility]. [facility] Pager [phone]  If 7PM-7AM, please contact night-coverage [email or web address] Password [email or web address] 09/19/2017, 6:54 PM

## 2017-09-19 NOTE — Progress Notes (Signed)
CCMD called pt ha SVT- 150.  Was in room with pt- asymptomatic, lying in bed, but moving. Will monitor

## 2017-09-19 NOTE — Progress Notes (Signed)
Foley removed per charge RN and unit protocol. Urine trial started at 6:50am this morning- foley was pulled out.

## 2017-09-19 NOTE — Progress Notes (Addendum)
Daily Rounding Note  09/19/2017, 9:24 AM  LOS: 2 days   SUBJECTIVE:   Chief complaint: c/o abdominal and back pain.  No BMs    Esophagram delayed due to the HIDA scan, which was ultimately cancelled.  Fluoro team says E gram is on for this afternoon.    OBJECTIVE:         Vital signs in last 24 hours:    Temp:  [98.2 F (36.8 C)-98.4 F (36.9 C)] 98.2 F (36.8 C) (09/18 0509) Pulse Rate:  [32-70] 65 (09/18 0646) Resp:  [17] 17 (09/18 0509) BP: (157-160)/(92-118) 157/94 (09/18 0646) SpO2:  [97 %-100 %] 97 % (09/18 0904) Weight:  [54.3 kg] 54.3 kg (09/18 0509) Last BM Date: 09/17/17 Filed Weights   09/16/17 1948 09/17/17 0344 09/19/17 0509  Weight: 56.7 kg 57.2 kg 54.3 kg   General: frail, cachectic.  uncomfortable   Heart: RRR Chest: clear bil.  Wearing nebulizer mask.   Abdomen: soft, BS hypoactive.  Diffuse mild-moderate tenderness  Extremities: no CCE Neuro/Psych:  Oriented to place, year, self.  Mentation a bit slow.    Intake/Output from previous day: 09/17 0701 - 09/18 0700 In: 570 [P.O.:270; IV Piggyback:300] Out: 1000 [Urine:1000]   Lab Results: Recent Labs    09/17/17 0350 09/18/17 0511 09/19/17 0627  WBC 7.0 8.4 7.9  HGB 14.5 14.7 16.1*  HCT 44.1 45.5 51.0*  PLT 227 260 270   BMET Recent Labs    09/17/17 0350 09/18/17 0511 09/19/17 0627  NA 143 142 141  K 3.6 3.4* 3.8  CL 104 102 102  CO2 _0 GLUCOSE 180* 105* 116*  BUN _1 CREATININE 1.00 1.05* 1.07*  CALCIUM 10.4* 11.0* 11.4*   LFT Recent Labs    09/17/17 0350 09/18/17 0511 09/19/17 0627  PROT 7.1 6.1* 6.3*  ALBUMIN 4.2 3.5 3.6  AST _2 ALT _3 ALKPHOS 116 89 92  BILITOT 1.0 1.2 1.0  BILIDIR 0.2  --   --   IBILI 0.8  --   --     Studies/Results: Dg Chest 2 View  Result Date: 09/18/2017 CLINICAL DATA:  Short of breath history of COPD EXAM: CHEST - 2 VIEW COMPARISON:  09/16/2017, 06/02/2016  FINDINGS: Hyperinflation with emphysematous disease. No focal consolidation or pleural effusion. Stable cardiomediastinal silhouette with aortic atherosclerosis. No pneumothorax. IMPRESSION: No active cardiopulmonary disease. Hyperinflation and emphysematous disease. Electronically Signed   By: Donavan Foil M.D.   On: 09/18/2017 14:52   Nm Pulmonary Perf And Vent  Result Date: 09/17/2017 CLINICAL DATA:  72 year old female with shortness of breath. Small bilateral pleural effusions on CT Abdomen and Pelvis earlier today. EXAM: NUCLEAR MEDICINE VENTILATION - PERFUSION LUNG SCAN TECHNIQUE: Ventilation images were obtained in multiple projections using inhaled aerosol Tc-47mDTPA. Perfusion images were obtained in multiple projections after intravenous injection of Tc-9106mAA. RADIOPHARMACEUTICALS:  32.2 mCi of Tc-9952mPA aerosol inhalation and 4.2 mCi Tc99m37m IV COMPARISON:  CT Abdomen and Pelvis 0119 hours today. AP chest x-ray 09/16/2017. FINDINGS: Ventilation: Mild artifactual clumping of the ventilation tracer, but relatively homogeneous tracer distribution. No convincing ventilation defect. Perfusion: Homogeneous perfusion radiotracer distribution in both lungs. No perfusion defect. IMPRESSION: No perfusion defect, no evidence of pulmonary embolus. Electronically Signed   By: H  HGenevie Ann.   On: 09/17/2017 15:53   Scheduled Meds: . budesonide (PULMICORT) nebulizer solution  0.25 mg Nebulization BID  . chlorhexidine  15 mL Mouth Rinse BID  . feeding supplement (GLUCERNA SHAKE)  237 mL Oral TID BM  . Influenza vac split quadrivalent PF  0.5 mL Intramuscular Tomorrow-1000  . insulin aspart  0-9 Units Subcutaneous Q4H  . ipratropium-albuterol  3 mL Nebulization QID  . mouth rinse  15 mL Mouth Rinse q12n4p  . mirtazapine  15 mg Oral QHS  . multivitamin with minerals  1 tablet Oral Daily  . pantoprazole  40 mg Oral Q0600  . sorbitol, milk of mag, mineral oil, glycerin (SMOG) enema  960 mL Rectal Once    Continuous Infusions: . ampicillin-sulbactam (UNASYN) IV 1.5 g (09/19/17 0140)   PRN Meds:.acetaminophen **OR** acetaminophen, albuterol, hydrALAZINE, HYDROcodone-acetaminophen, morphine injection, ondansetron **OR** ondansetron (ZOFRAN) IV   ASSESMENT:   * Abnormal esophagus on CT. EGD 06/2017: normal esophagus, fundal gastritis. Not taking PPI at home.  Protonix started 9/16.    Pt recently using 2 Aleve daily for ~ 1 week.   *   Mesenteric edema, pericholecystic edema, mild intra/extra hepatic ductal  Dilatation, focal PD narrowing per CT, MRCP.  Suspect Gb findings due to right heart failure and do not represent cholecystitis. Other findings felt to be artifact per d/w Dr Carlean Purl and radiologist.      *   Constipation.  SMOG enema ordered.  * Primary hyperparathyroidism, hypercalcemia. Parathyroidectomy set for 10/02/17.    *  Pulmonary htn.  Diastolic dysfx.  Needs cardiac eval in light of upcoming parathyroidectomy.       PLAN   *   Await esophagram.  NPO for this study, can resume PO afterwards.    *   administer enema.    *  Cardiac consult ordered by Dr Algis Liming is in progress.      *   Stopped Unasyn.      Denise [name]  09/19/2017, 9:24 AM Phone [phone]  Esophageal diverticulum on ba swallow Right heart cath tomorrow Slight results w/ enema F/u tomorrow   Gatha Mayer, MD, [facility] 09/19/2017 7:53 PM

## 2017-09-19 NOTE — Progress Notes (Signed)
Nutrition Follow-up  DOCUMENTATION CODES:   Severe malnutrition in context of chronic illness  INTERVENTION:   -Once diet is advanced, resume:  -Glucerna Shake po TID, each supplement provides 220 kcal and 10 grams of protein -MVI with minerals daily -30 ml Prostat BID, each supplement provides 100 kcals and 15 grams protein  NUTRITION DIAGNOSIS:   Severe Malnutrition related to chronic illness(COPD) as evidenced by moderate fat depletion, severe fat depletion, moderate muscle depletion, severe muscle depletion, energy intake < 75% for > or equal to 1 month.  Ongoing  GOAL:   Patient will meet greater than or equal to 90% of their needs  Progressing  MONITOR:   PO intake, Supplement acceptance, Diet advancement, Labs, Weight trends, Skin, I & O's  REASON FOR ASSESSMENT:   Malnutrition Screening Tool    ASSESSMENT:    Denise [name] is a 72 y.o. female with history of COPD, hypertension, colon cancer status post resection, hyperparathyroidism scheduled for surgery next month presents to the ER with complaints of increasing exertional shortness of breath over the last 1 week with epigastric pain.   Reviewed I/O's: +430 ml x 24 hours and -3.7 L since admisssion  Pt down for procedure at time of visit. She is NPO for esophagram.   Pt previously on full liquid diet with poor oral intake; PO 25%. Pt has been taking Glucerna supplements per MAR (Glucerna ordered per pt preference vs Ensure, due to pt concern over elevated CBGS).   Labs reviewed: CBGS: 118-142 (inpatient orders for glycemic control are 0-9 units insulin aspart every 4 hours).   Diet Order:   Diet Order            Diet NPO time specified Except for: Sips with Meds  Diet effective now              EDUCATION NEEDS:   Education needs have been addressed  Skin:  Skin Assessment: Reviewed RN Assessment  Last BM:  09/17/17  Height:   Ht Readings from Last 1 Encounters:  09/17/17 5' 6" (1.676  m)    Weight:   Wt Readings from Last 1 Encounters:  09/19/17 54.3 kg    Ideal Body Weight:  59.1 kg  BMI:  Body mass index is 19.3 kg/m.  Estimated Nutritional Needs:   Kcal:  1800-2000  Protein:  100-115 grams  Fluid:  1.8-2.0 L    Darril Patriarca A. Jimmye Norman, RD, LDN, CDE Pager: [phone] After hours Pager: [phone]

## 2017-09-19 NOTE — Consult Note (Addendum)
Cardiology Consultation:   Patient ID: Denise [name] [id number]; August 29, 1945   Admit date: 09/16/2017 Date of Consult: 09/19/2017  Primary Care Provider: [name], NP Primary Cardiologist: New to [facility]  Patient Profile:   Denise [name] is a 72 y.o. female with a hx of COPD, hypertension, HLD, DM 2, colon cancer s/p resection and hyperparathyroidism scheduled for surgery next month who is being seen today for the evaluation of acute CHF exacerbation at the request of Dr. Tyrell Antonio.  History of Present Illness:   Denise [name] is a 71 year old female with a history stated above who presented to [facility] on 09/16/2017 with complaints of increasing exertional shortness of breath for approximately three days in duration with associated epigastric pain with radiation to mid back and one episode of vomiting without diarrhea. At baseline, she reports mild dyspnea with exertion secondary to COPD however this has been more prominent. She states that the SOB is worse with lying flat and improves with sitting upright. She has been using NSAIDS for pain relief in relation to the epigastric discomfort. She denies recent illness including fever, chills, as well as chest pain, LE swelling, palpitations, dizziness or syncope. She reports a weight loss of approximately 25lbs over the last 6 months. Given her persistence symptoms, she reported to [facility] for further evaluation. She denies tobacco, alcohol, or illicit drug use. Denies personal of family hx of CAD.   In the ED, CXR revealed cardiomegaly with pulmonary vascular redistribution and mild interstitial edema with small left effusion. BNP was 310.  Abdominal CT revealed abnormal appearance of the distal esophagus and EG junction, suspicious for esophageal neoplasm with recommendations for correlation with endoscopy. There was also mild intra-and extrahepatic bile duct dilation without cause identified with correlation with liver function  studies and follow-up elective MRCP recommended.  LFTs were found to be normal.  Patient was given nebulizer treatment, steroids and Lasix for COPD versus CHF exacerbation treatment while in the emergency department with relief.  Additionally, she was found to have significant urinary retention and Foley catheter was placed. GI was consulted given her hx and presenting symptoms.   Cardiology has been consulted with concern for significant RHF with PA peak pressure of 60mHg on echocardiogram likely causing ascites and pericholecystic edema per GI notes.   Past Medical History:  Diagnosis Date  . Altered mental status   . Anxiety and depression   . [place]   Resected  . COPD (chronic obstructive pulmonary disease) ([place]   . Diabetes mellitus without complication ([name])    diet controlled- no meds. per pt  . Diverticulosis   . Hyperlipidemia   . Hypertension   . Insomnia   . Primary hyperparathyroidism ([place]   . Tubular adenoma of rectum 02/23/2013   low grade  . Vitamin D deficiency     Past Surgical History:  Procedure Laterality Date  . ABDOMINAL HYSTERECTOMY  08/2015  . BREAST BIOPSY Left   . BREAST EXCISIONAL BIOPSY Right   . COLON RESECTION  1980's  . COLONOSCOPY    . ENDOMETRIAL BIOPSY  2009   negative  . INCONTINENCE SURGERY  2017     Prior to Admission medications   Medication Sig Start Date End Date Taking? Authorizing Provider  albuterol (PROVENTIL HFA;VENTOLIN HFA) 108 (90 Base) MCG/ACT inhaler Inhale 1-2 puffs into the lungs every 4 (four) hours as needed for wheezing or shortness of breath. 05/22/17  Yes CPleas Koch NP  aspirin EC 81  MG tablet Take 81 mg by mouth daily.   Yes [provider]  dicyclomine (BENTYL) 10 MG capsule TAKE 1 CAPSULE BY MOUTH THREE TIMES DAILY BEFORE A MEAL AS NEEDED FOR STOMACH CRAMPING. Patient taking differently: Take 10 mg by mouth See admin instructions. Take 1 capsule (10 mg)  by mouth three times daily  before a meal as needed for stomach cramping. 06/19/17  Yes [name], NP  Fluticasone-Salmeterol [facility] INHUB) 250-50 MCG/DOSE AEPB Inhale 1 puff into the lungs 2 (two) times daily as needed (shortness of breath/wheezing).    Yes [provider]  losartan (COZAAR) 100 MG tablet Take 1 tablet by mouth once daily for blood pressure. Patient taking differently: Take 100 mg by mouth daily. for blood pressure. 05/22/17  Yes [name], NP  Melatonin 5 MG TABS Take 5 mg by mouth at bedtime as needed (sleep).   Yes [provider]  mirtazapine (REMERON) 15 MG tablet Take 2 tablets by mouth at bedtime for depression and appetite. Patient taking differently: Take 15 mg by mouth at bedtime. for depression and appetite. 06/19/17  Yes [name], NP  naproxen sodium (ALEVE) 220 MG tablet Take 220-440 mg by mouth daily as needed (pain).   Yes [provider]  NIFEdipine (PROCARDIA XL/ADALAT-CC) 60 MG 24 hr tablet TAKE 1 TABLET BY MOUTH ONCE DAILY FOR BLOOD PRESSURE Patient taking differently: Take 60 mg by mouth daily. FOR BLOOD PRESSURE 07/13/17  Yes [name], NP  sertraline (ZOLOFT) 25 MG tablet Take 1 tablet by mouth once daily for depression. Patient taking differently: Take 25 mg by mouth daily. for depression. 05/22/17  Yes [name], NP  atorvastatin (LIPITOR) 10 MG tablet Take 1 tablet by mouth at bedtime for cholesterol. Patient not taking: Reported on 09/16/2017 05/22/17   [name], NP  Fluticasone-Salmeterol (ADVAIR DISKUS) 250-50 MCG/DOSE AEPB Inhale 1 puff into the lungs 2 (two) times daily. Patient not taking: Reported on 09/16/2017 05/16/17   [name], NP  omeprazole (PRILOSEC) 20 MG capsule Take 1 capsule (20 mg total) by mouth daily before breakfast. About 30 mins before Patient not taking: Reported on 09/16/2017 06/21/17   Gatha Mayer, MD    Inpatient Medications: Scheduled Meds: . budesonide (PULMICORT)  nebulizer solution  0.25 mg Nebulization BID  . chlorhexidine  15 mL Mouth Rinse BID  . feeding supplement (GLUCERNA SHAKE)  237 mL Oral TID BM  . Influenza vac split quadrivalent PF  0.5 mL Intramuscular Tomorrow-1000  . insulin aspart  0-9 Units Subcutaneous Q4H  . ipratropium-albuterol  3 mL Nebulization QID  . mouth rinse  15 mL Mouth Rinse q12n4p  . mirtazapine  15 mg Oral QHS  . multivitamin with minerals  1 tablet Oral Daily  . pantoprazole  40 mg Oral Q0600  . sorbitol, milk of mag, mineral oil, glycerin (SMOG) enema  960 mL Rectal Once   Continuous Infusions: . ampicillin-sulbactam (UNASYN) IV 1.5 g (09/19/17 0140)   PRN Meds: acetaminophen **OR** acetaminophen, albuterol, hydrALAZINE, HYDROcodone-acetaminophen, morphine injection, ondansetron **OR** ondansetron (ZOFRAN) IV  Allergies:   No Known Allergies  Social History:   Social History   Socioeconomic History  . Marital status: Married    Spouse name: Not on file  . Number of children: 3  . Years of education: Not on file  . Highest education level: Not on file  Occupational History  . Not on file  Social Needs  . Financial resource strain:  Not on file  . Food insecurity:    Worry: Not on file    Inability: Not on file  . Transportation needs:    Medical: Not on file    Non-medical: Not on file  Tobacco Use  . Smoking status: Former Smoker    Types: Cigarettes    Last attempt to quit: 1989    Years since quitting: 30.7  . Smokeless tobacco: Never Used  Substance and Sexual Activity  . Alcohol use: No  . Drug use: No  . Sexual activity: Yes    Partners: Male    Birth control/protection: Post-menopausal, Surgical  Lifestyle  . Physical activity:    Days per week: Not on file    Minutes per session: Not on file  . Stress: Not on file  Relationships  . Social connections:    Talks on phone: Not on file    Gets together: Not on file    Attends religious service: Not on file    Active member of club  or organization: Not on file    Attends meetings of clubs or organizations: Not on file    Relationship status: Not on file  . Intimate partner violence:    Fear of current or ex partner: Not on file    Emotionally abused: Not on file    Physically abused: Not on file    Forced sexual activity: Not on file  Other Topics Concern  . Not on file  Social History Narrative   Married. 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 Ovarian cancer Mother   . Breast cancer Neg Hx   . Hyperparathyroidism Neg Hx   . Colon cancer Neg Hx   . Esophageal cancer Neg Hx   . Stomach cancer Neg Hx    Family Status:  Family Status  Relation Name Status  . Mother  Deceased  . Father  Deceased  . Neg Hx  (Not Specified)    ROS:  Please see the history of present illness.  All other ROS reviewed and negative.     Physical Exam/Data:   Vitals:   09/18/17 2228 09/19/17 0509 09/19/17 0646 09/19/17 0904  BP:  (!) 159/118 (!) 157/94   Pulse: (!) 42 70 65   Resp:  17    Temp:  98.2 F (36.8 C)    TempSrc:  Oral    SpO2:  98%  97%  Weight:  54.3 kg    Height:        Intake/Output Summary (Last 24 hours) at 09/19/2017 0956 Last data filed at 09/19/2017 0600 Gross per 24 hour  Intake 470 ml  Output 1000 ml  Net -530 ml   Filed Weights   09/16/17 1948 09/17/17 0344 09/19/17 0509  Weight: 56.7 kg 57.2 kg 54.3 kg   Body mass index is 19.3 kg/m.   General: Frail, pleasant, NAD Skin: Warm, dry, intact  Head: Normocephalic, atraumatic, clear, moist mucus membranes. Neck: Negative for carotid bruits. No JVD Lungs:Clear to ausculation bilaterally. No wheezes, rales, or rhonchi. Breathing is unlabored. Cardiovascular: Irregular with S1 S2. + murmurs. No rubs, gallops, or LV heave appreciated. Abdomen: Soft, mild tenderness, non-distended  with normoactive bowel sounds.  No obvious abdominal masses. MSK: Strength and tone appear normal for age. 5/5  in all extremities Extremities: No edema. No clubbing or cyanosis. DP/PT pulses 1+ bilaterally Neuro: Alert and oriented. No focal deficits. No facial asymmetry. MAE spontaneously. Psych: Responds to questions appropriately with normal affect.     EKG:  The EKG was personally reviewed and demonstrates: 09/18/17 NSR HR 78 with PAC's and evidence of LVH Telemetry:  Telemetry was personally reviewed and demonstrates: 09/19/17 NSR/ST with frequent PACs  Relevant CV Studies:  ECHO: 09/18/17: Study Conclusions  - Left ventricle: The cavity size was normal. There was moderate   septal hypertrophy with otherwise mild concentric hypertrophy.   Systolic function was normal. The estimated ejection fraction was   in the range of 60% to 65%. There was dynamic obstruction at   rest, with a peak velocity of 189 cm/sec and a peak gradient of   14 mm Hg. Wall motion was normal; there were no regional wall   motion abnormalities. Doppler parameters are consistent with   abnormal left ventricular relaxation (grade 1 diastolic   dysfunction). - Aortic valve: Transvalvular velocity was within the normal range.   There was no stenosis. There was mild regurgitation. - Mitral valve: Transvalvular velocity was within the normal range.   There was no evidence for stenosis. There was trivial   regurgitation. - Right ventricle: The cavity size was normal. Wall thickness was   normal. Systolic function was normal. - Atrial septum: No defect or patent foramen ovale was identified   by color flow Doppler. - Tricuspid valve: There was mild regurgitation. - Pulmonary arteries: Systolic pressure was severely increased. PA   peak pressure: 69 mm Hg (S).  CATH: None   Laboratory Data:  Chemistry Recent Labs  Lab 09/17/17 0350 09/18/17 0511 09/19/17 0627  NA 143 142 141  K 3.6 3.4* 3.8  CL 104  102 102  CO2 _0 GLUCOSE 180* 105* 116*  BUN _1 CREATININE 1.00 1.05* 1.07*  CALCIUM 10.4* 11.0* 11.4*  GFRNONAA 55* 52* 51*  GFRAA >60 60* 59*  ANIONGAP 12 16* 11    Total Protein  Date Value Ref Range Status  09/19/2017 6.3 (L) 6.5 - 8.1 g/dL Final   Albumin  Date Value Ref Range Status  09/19/2017 3.6 3.5 - 5.0 g/dL Final   AST  Date Value Ref Range Status  09/19/2017 19 15 - 41 U/L Final   ALT  Date Value Ref Range Status  09/19/2017 20 0 - 44 U/L Final   Alkaline Phosphatase  Date Value Ref Range Status  09/19/2017 92 38 - 126 U/L Final   Total Bilirubin  Date Value Ref Range Status  09/19/2017 1.0 0.3 - 1.2 mg/dL Final   Hematology Recent Labs  Lab 09/17/17 0350 09/18/17 0511 09/19/17 0627  WBC 7.0 8.4 7.9  RBC 5.17* 5.22* 5.77*  HGB 14.5 14.7 16.1*  HCT 44.1 45.5 51.0*  MCV 85.3 87.2 88.4  MCH 28.0 28.2 27.9  MCHC 32.9 32.3 31.6  RDW 15.0 15.1 15.4  PLT 227 260 270   Cardiac Enzymes Recent Labs  Lab 09/17/17 0350 09/17/17 0844 09/17/17 1439  TROPONINI <0.03 <0.03 <0.03    Recent Labs  Lab 09/16/17 2017  TROPIPOC 0.01    BNP Recent Labs  Lab 09/16/17 2005  BNP 310.4*    DDimer  Recent Labs  Lab 09/17/17 0350  DDIMER 0.90*   TSH:  Lab Results  Component Value Date   TSH 0.44 04/11/2017   Lipids: Lab Results  Component Value Date  CHOL 148 04/11/2017   HDL 50.80 04/11/2017   LDLCALC 76 04/11/2017   TRIG 103.0 04/11/2017   CHOLHDL 3 04/11/2017   HgbA1c: Lab Results  Component Value Date   HGBA1C 6.2 04/11/2017    Radiology/Studies:  Dg Chest 2 View  Result Date: 09/18/2017 CLINICAL DATA:  Short of breath history of COPD EXAM: CHEST - 2 VIEW COMPARISON:  09/16/2017, 06/02/2016 FINDINGS: Hyperinflation with emphysematous disease. No focal consolidation or pleural effusion. Stable cardiomediastinal silhouette with aortic atherosclerosis. No pneumothorax. IMPRESSION: No active cardiopulmonary disease.  Hyperinflation and emphysematous disease. Electronically Signed   By: Donavan Foil M.D.   On: 09/18/2017 14:52   Ct Abdomen Pelvis W Contrast  Result Date: 09/17/2017 CLINICAL DATA:  Acute generalized abdominal pain. Vomiting. History of colon cancer resected 1989. EXAM: CT ABDOMEN AND PELVIS WITH CONTRAST TECHNIQUE: Multidetector CT imaging of the abdomen and pelvis was performed using the standard protocol following bolus administration of intravenous contrast. CONTRAST:  155m OMNIPAQUE IOHEXOL 300 MG/ML  SOLN COMPARISON:  06/14/2017 FINDINGS: Lower chest: Small bilateral pleural effusions with basilar atelectasis or consolidation. As seen previously there is an abnormal appearance of the distal esophagus and EG junction with poor definition of the esophageal wall and edema around the distal esophagus. Lymphadenopathy of the EG junction. Appearance is suspicious for esophageal neoplasm. If not previously evaluated, endoscopy may be of use. Hepatobiliary: No focal liver lesions. Gallbladder is distended without wall thickening or inflammatory changes. Mild intra and extrahepatic bile duct dilatation without cause identified. Correlation with liver function studies is recommended. Could consider follow-up with elective MRCP. Pancreas: Unremarkable. No pancreatic ductal dilatation or surrounding inflammatory changes. Spleen: Normal in size without focal abnormality. Adrenals/Urinary Tract: Bilateral renal cysts. Nephrograms are symmetrical. No hydronephrosis or hydroureter. No adrenal gland nodules. Bladder is partially decompressed with a Foley catheter. Stomach/Bowel: Diffusely stool-filled colon. Stomach, small bowel, and colon are not abnormally distended. Mesenteric edema particularly in the pelvis. No obvious wall thickening although under distention would limit evaluation of bowel wall. Appendix is normal. Vascular/Lymphatic: Diffuse calcification of the abdominal aorta. No significant retroperitoneal  lymphadenopathy. Reproductive: Status post hysterectomy. No adnexal masses. Other: Small amount of free fluid in the upper abdomen and pelvis. No free air. Surgical scarring in the anterior abdominal wall. Musculoskeletal: No destructive bone lesions. IMPRESSION: 1. Small bilateral pleural effusions with basilar atelectasis or consolidation. 2. Abnormal appearance of the distal esophagus and EG junction with poor definition of the esophageal wall and edema around the distal esophagus. Lymphadenopathy in the junction. Appearance is suspicious for esophageal neoplasm. Correlation with endoscopy is recommended. 3. Mild intra and extrahepatic bile duct dilatation without cause identified. Correlation with liver function studies is recommended. Consider follow-up with elective MRCP. 4. Mesenteric edema particularly in the pelvis. This could represent secondary changes due to enteritis. No evidence of bowel obstruction but bowel wall is not completely evaluated due to under distention. 5. Small amount of free fluid in the abdomen and pelvis. Aortic Atherosclerosis (ICD10-I70.0). Electronically Signed   By: WLucienne CapersM.D.   On: 09/17/2017 01:35   Mr Abdomen Mrcp Wo Contrast  Result Date: 09/17/2017 CLINICAL DATA:  Suspect pancreatitis.  Atypical presentation. EXAM: MRI ABDOMEN WITHOUT CONTRAST  (INCLUDING MRCP) TECHNIQUE: Multiplanar multisequence MR imaging of the abdomen was performed. Heavily T2-weighted images of the biliary and pancreatic ducts were obtained, and three-dimensional MRCP images were rendered by post processing. COMPARISON:  CT abdomen and pelvis 09/17/2017 FINDINGS: Lower chest: Small bilateral pleural effusions with basilar atelectasis.  Hepatobiliary: No focal lesions are demonstrated in the liver. The gallbladder is distended with layering of sludge in the gallbladder, mild gallbladder wall thickening, and with edema around the gallbladder and extending up along the liver edge. This  probably reflects acute cholecystitis. Bile ducts are mildly dilated with evidence of filling defects in the mid and distal common bile duct suggesting common duct stones. Pancreas: Pancreatic duct is not significantly dilated but there is a persistent area of narrowing demonstrated at the body of the pancreas. This could represent inflammatory stricture or an intraductal neoplasm. Pancreatic parenchyma appears homogeneous. Spleen: Spleen size is normal. Several accessory spleens are present. No focal lesions. Adrenals/Urinary Tract: Multiple bilateral renal cysts. No hydronephrosis or hydroureter. Stomach/Bowel: Visualized stomach, small, and large bowel are not abnormally distended. Vascular/Lymphatic: Normal caliber abdominal aorta and inferior vena cava. No significant lymphadenopathy. Other:  None. Musculoskeletal: Lumbar scoliosis convex towards the right. Mostly fatty replaced marrow. No expansile bone lesions are appreciated. IMPRESSION: 1. Probable cholecystitis and cholelithiasis as above. 2. Intra and extrahepatic bile duct dilatation. Probable choledocholithiasis. 3. Focal area of persistent narrowing of the pancreatic duct at the body. No specific mass identified. This could represent inflammatory stricture or intraductal neoplasm. 4. Small amount of free fluid in the upper abdomen, likely reactive. Electronically Signed   By: Lucienne Capers M.D.   On: 09/17/2017 06:26   Mr 3d Recon At Scanner  Result Date: 09/17/2017 CLINICAL DATA:  Suspect pancreatitis.  Atypical presentation. EXAM: MRI ABDOMEN WITHOUT CONTRAST  (INCLUDING MRCP) TECHNIQUE: Multiplanar multisequence MR imaging of the abdomen was performed. Heavily T2-weighted images of the biliary and pancreatic ducts were obtained, and three-dimensional MRCP images were rendered by post processing. COMPARISON:  CT abdomen and pelvis 09/17/2017 FINDINGS: Lower chest: Small bilateral pleural effusions with basilar atelectasis. Hepatobiliary: No  focal lesions are demonstrated in the liver. The gallbladder is distended with layering of sludge in the gallbladder, mild gallbladder wall thickening, and with edema around the gallbladder and extending up along the liver edge. This probably reflects acute cholecystitis. Bile ducts are mildly dilated with evidence of filling defects in the mid and distal common bile duct suggesting common duct stones. Pancreas: Pancreatic duct is not significantly dilated but there is a persistent area of narrowing demonstrated at the body of the pancreas. This could represent inflammatory stricture or an intraductal neoplasm. Pancreatic parenchyma appears homogeneous. Spleen: Spleen size is normal. Several accessory spleens are present. No focal lesions. Adrenals/Urinary Tract: Multiple bilateral renal cysts. No hydronephrosis or hydroureter. Stomach/Bowel: Visualized stomach, small, and large bowel are not abnormally distended. Vascular/Lymphatic: Normal caliber abdominal aorta and inferior vena cava. No significant lymphadenopathy. Other:  None. Musculoskeletal: Lumbar scoliosis convex towards the right. Mostly fatty replaced marrow. No expansile bone lesions are appreciated. IMPRESSION: 1. Probable cholecystitis and cholelithiasis as above. 2. Intra and extrahepatic bile duct dilatation. Probable choledocholithiasis. 3. Focal area of persistent narrowing of the pancreatic duct at the body. No specific mass identified. This could represent inflammatory stricture or intraductal neoplasm. 4. Small amount of free fluid in the upper abdomen, likely reactive. Electronically Signed   By: Lucienne Capers M.D.   On: 09/17/2017 06:26   Nm Pulmonary Perf And Vent  Result Date: 09/17/2017 CLINICAL DATA:  72 year old female with shortness of breath. Small bilateral pleural effusions on CT Abdomen and Pelvis earlier today. EXAM: NUCLEAR MEDICINE VENTILATION - PERFUSION LUNG SCAN TECHNIQUE: Ventilation images were obtained in multiple  projections using inhaled aerosol Tc-27mDTPA. Perfusion images were obtained in  multiple projections after intravenous injection of Tc-17mMAA. RADIOPHARMACEUTICALS:  32.2 mCi of Tc-967mTPA aerosol inhalation and 4.2 mCi Tc9953mA IV COMPARISON:  CT Abdomen and Pelvis 0119 hours today. AP chest x-ray 09/16/2017. FINDINGS: Ventilation: Mild artifactual clumping of the ventilation tracer, but relatively homogeneous tracer distribution. No convincing ventilation defect. Perfusion: Homogeneous perfusion radiotracer distribution in both lungs. No perfusion defect. IMPRESSION: No perfusion defect, no evidence of pulmonary embolus. Electronically Signed   By: H  Genevie AnnD.   On: 09/17/2017 15:53   Dg Abdomen Acute W/chest  Result Date: 09/16/2017 CLINICAL DATA:  Patient with abdominal pain. EXAM: DG ABDOMEN ACUTE W/ 1V CHEST COMPARISON:  Chest radiograph 06/02/2016 FINDINGS: Monitoring leads overlie the patient. Cardiac contours upper limits of normal. Aortic atherosclerosis. Bilateral interstitial pulmonary opacities. Small left pleural effusion. Gas is demonstrated within nondilated loops of bowel throughout the abdomen. Stool throughout the colon. Lumbar spine degenerative changes. IMPRESSION: Cardiomegaly with pulmonary vascular redistribution and mild interstitial edema. Small left effusion. Nonobstructed bowel gas pattern. Stool throughout the colon. Electronically Signed   By: DreLovey NewcomerD.   On: 09/16/2017 21:13   Assessment and Plan:   1.Dyspnea with concern for significant PAH: -[facility] with worsening dyspnea on exertion for approximately 3 days with associated severe epigastric pain. Initial work-up for GI concerns revealed abnormal esophagus per CT concerning for malignancy.   -Echocardiogram performed 09/18/2017 in the setting of dyspnea found to have a normal LVEF with G1 DD however, significantly elevated PA peak pressures at 108m79m>> likely in the setting of COPD    -VQ scan performed with no perfusion defect, no evidence of pulmonary embolus -GI concerned that ascites and pericholecystic edema secondary to significant right heart failure as opposed to intra-abdominal infection given no fever, no leukocytosis and not tender on exam. -Requiring less O2 today>initally was requiring 6L on admission  -Weight, 119.6lb today, 125lb on admission -I&O, net -3.7 L today -Consider adding low dose IV Lasix 40mg63mly and monitor closely  -Will plan for [facility] for definitive diagnosis of PAH (likely group 3 PH) and more accurate measurement of right heart pressures, scheduled for 09/20/17 -Will need closer monitoring/treatment of COPD and continuous O2 supplementation   2.  Abdominal pain: -Stable today, receiving pain medications per primary team  -Abdominal scan with probable cholecystitis and cholelithiasis as well as intra-and extrahepatic bile duct dilation with no mass identified.  -GI consult with recommendations for CHF consultation secondary to significant PAH thought to be etiology of ascites and pericholecystic edema given no objective evidence of infectious process  3. Frequent PACs: -Pt having frequent PAC's per telemetry review, asymptomatic  -HR stable and elevated BP>>consider adding low dose BB and monitor for resolution   4.  Esophageal mass per CT: -Patient with a history of colon cancer s/p resection 1988 now found to have abnormal esophagus per CT this admission concerning for further malignancy -GI following  5.  Primary parathyroidism: -Parathyroidectomy scheduled for 10/02/2017 with Dr. RamirRosendo Groscern is for significant PAH and upcoming surgery>>>question to proceed?   6.  DM2: -Stable, SSI for glucose control while inpatient status -Last hemoglobin A1c, 6.2 on 04/11/2017   For questions or updates, please contact [facility] consult [email or web address] for contact info under Cardiology/STEMI.   Signed, JilKathyrn DrownC [facility] Pager: [phone] 9:56 AM  Patient examined chart reviewed Discussed care with NP, patient , sisters and daughter. She is very active [facility] [employer] She quit smoking years  ago. Has significant emphysema with only inhalers at home. Exam with thin black female mild JVP elevation no murmur no HJR previous colon cancer surgery and no edema. Review of echo shows moderate LVH only grade one diastolic dysfunction. RV mild dilated but no cor pulmonale and IVC not dilated. Would doubt that ascites if from right heart failure. Agree that right heart catheterization in order to confirm moderate/severe pulmonary HTN as she may be a candidate for vasodilator Rx. Recent V/Q no evidence of CTEPH.  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 Discussed right heart cath and risks willing to proceed Will try to arrange for tomorrow Would ask pulmonary to see after right heart tomorrow to guide Rx and she may need home oxygen and outpatient f/u with them   Denise [name]

## 2017-09-20 ENCOUNTER — Encounter (HOSPITAL_COMMUNITY)
Admission: EM | Disposition: A | Discharge status: Home / Self Care | Payer: Self-pay | Source: Home / Self Care | Attending: Internal Medicine

## 2017-09-20 DIAGNOSIS — I272 Pulmonary hypertension, unspecified: Secondary | ICD-10-CM

## 2017-09-20 DIAGNOSIS — J9601 Acute respiratory failure with hypoxia: Principal | ICD-10-CM

## 2017-09-20 DIAGNOSIS — J439 Emphysema, unspecified: Secondary | ICD-10-CM

## 2017-09-20 HISTORY — PX: RIGHT HEART CATH: CATH118263

## 2017-09-20 LAB — CBC
HCT: 45.4 % (ref 36.0–46.0)
HEMOGLOBIN: 14.5 g/dL (ref 12.0–15.0)
MCH: 27.9 pg (ref 26.0–34.0)
MCHC: 31.9 g/dL (ref 30.0–36.0)
MCV: 87.3 fL (ref 78.0–100.0)
Platelets: 276 10*3/uL (ref 150–400)
RBC: 5.2 MIL/uL — ABNORMAL HIGH (ref 3.87–5.11)
RDW: 14.8 % (ref 11.5–15.5)
WBC: 6.8 10*3/uL (ref 4.0–10.5)

## 2017-09-20 LAB — POCT I-STAT 3, VENOUS BLOOD GAS (G3P V)
ACID-BASE EXCESS: 6 mmol/L — AB (ref 0.0–2.0)
ACID-BASE EXCESS: 6 mmol/L — AB (ref 0.0–2.0)
Bicarbonate: 32.1 mmol/L — ABNORMAL HIGH (ref 20.0–28.0)
Bicarbonate: 32.8 mmol/L — ABNORMAL HIGH (ref 20.0–28.0)
O2 SAT: 51 %
O2 Saturation: 50 %
PCO2 VEN: 51.3 mmHg (ref 44.0–60.0)
TCO2: 34 mmol/L — AB (ref 22–32)
TCO2: 34 mmol/L — ABNORMAL HIGH (ref 22–32)
pCO2, Ven: 52.4 mmHg (ref 44.0–60.0)
pH, Ven: 7.404 (ref 7.250–7.430)
pH, Ven: 7.404 (ref 7.250–7.430)
pO2, Ven: 27 mmHg — CL (ref 32.0–45.0)
pO2, Ven: 28 mmHg — CL (ref 32.0–45.0)

## 2017-09-20 LAB — BASIC METABOLIC PANEL
ANION GAP: 9 (ref 5–15)
BUN: 25 mg/dL — AB (ref 8–23)
CHLORIDE: 102 mmol/L (ref 98–111)
CO2: 30 mmol/L (ref 22–32)
Calcium: 10.9 mg/dL — ABNORMAL HIGH (ref 8.9–10.3)
Creatinine, Ser: 0.87 mg/dL (ref 0.44–1.00)
GFR calc Af Amer: >60 mL/min (ref >60)
GLUCOSE: 125 mg/dL — AB (ref 70–99)
POTASSIUM: 3.7 mmol/L (ref 3.5–5.1)
Sodium: 141 mmol/L (ref 135–145)

## 2017-09-20 LAB — GLUCOSE, CAPILLARY
GLUCOSE-CAPILLARY: 140 mg/dL — AB (ref 70–99)
GLUCOSE-CAPILLARY: 130 mg/dL — AB (ref 70–99)
GLUCOSE-CAPILLARY: 126 mg/dL — AB (ref 70–99)
GLUCOSE-CAPILLARY: 152 mg/dL — AB (ref 70–99)
Glucose-Capillary: 161 mg/dL — ABNORMAL HIGH (ref 70–99)
Glucose-Capillary: 175 mg/dL — ABNORMAL HIGH (ref 70–99)

## 2017-09-20 SURGERY — RIGHT HEART CATH
Anesthesia: LOCAL

## 2017-09-20 MED ORDER — HEPARIN (PORCINE) IN NACL 1000-0.9 UT/500ML-% IV SOLN
INTRAVENOUS | Status: AC
  Filled 2017-09-20: qty 500

## 2017-09-20 MED ORDER — HEPARIN (PORCINE) IN NACL 1000-0.9 UT/500ML-% IV SOLN
INTRAVENOUS | Status: DC | PRN
  Administered 2017-09-20: 500 mL

## 2017-09-20 MED ORDER — HYDRALAZINE HCL 20 MG/ML IJ SOLN
INTRAMUSCULAR | Status: DC | PRN
  Administered 2017-09-20: 10 mg via INTRAVENOUS

## 2017-09-20 MED ORDER — LIDOCAINE HCL (PF) 1 % IJ SOLN
INTRAMUSCULAR | Status: DC | PRN
  Administered 2017-09-20: 2 mL via SUBCUTANEOUS

## 2017-09-20 MED ORDER — MILK AND MOLASSES ENEMA
1.0000 | Freq: Once | RECTAL | Status: AC
  Administered 2017-09-20: 250 mL via RECTAL
  Filled 2017-09-20 (×2): qty 250

## 2017-09-20 MED ORDER — HYDRALAZINE HCL 20 MG/ML IJ SOLN
INTRAMUSCULAR | Status: AC
  Filled 2017-09-20: qty 1

## 2017-09-20 MED ORDER — ALBUTEROL SULFATE (2.5 MG/3ML) 0.083% IN NEBU
2.5000 mg | INHALATION_SOLUTION | Freq: Four times a day (QID) | RESPIRATORY_TRACT | Status: DC
  Administered 2017-09-21 (×3): 2.5 mg via RESPIRATORY_TRACT
  Filled 2017-09-20 (×5): qty 3

## 2017-09-20 MED ORDER — FENTANYL CITRATE (PF) 100 MCG/2ML IJ SOLN
INTRAMUSCULAR | Status: AC
  Filled 2017-09-20: qty 2

## 2017-09-20 MED ORDER — BUDESONIDE 0.5 MG/2ML IN SUSP
0.5000 mg | Freq: Two times a day (BID) | RESPIRATORY_TRACT | Status: DC
  Administered 2017-09-20 – 2017-09-24 (×7): 0.5 mg via RESPIRATORY_TRACT
  Filled 2017-09-20 (×8): qty 2

## 2017-09-20 MED ORDER — MAGNESIUM HYDROXIDE 400 MG/5ML PO SUSP
30.0000 mL | Freq: Every day | ORAL | Status: DC | PRN
  Administered 2017-09-21: 30 mL via ORAL
  Filled 2017-09-20: qty 30

## 2017-09-20 MED ORDER — LIDOCAINE HCL (PF) 1 % IJ SOLN
INTRAMUSCULAR | Status: AC
  Filled 2017-09-20: qty 30

## 2017-09-20 MED ORDER — MIDAZOLAM HCL 2 MG/2ML IJ SOLN
INTRAMUSCULAR | Status: DC | PRN
  Administered 2017-09-20: 1 mg via INTRAVENOUS

## 2017-09-20 MED ORDER — FENTANYL CITRATE (PF) 100 MCG/2ML IJ SOLN
INTRAMUSCULAR | Status: DC | PRN
  Administered 2017-09-20: 25 ug via INTRAVENOUS

## 2017-09-20 MED ORDER — MIDAZOLAM HCL 2 MG/2ML IJ SOLN
INTRAMUSCULAR | Status: AC
  Filled 2017-09-20: qty 2

## 2017-09-20 SURGICAL SUPPLY — 6 items
CATH BALLN WEDGE 5F 110CM (CATHETERS) ×2 IMPLANT
PACK CARDIAC CATHETERIZATION (CUSTOM PROCEDURE TRAY) ×2 IMPLANT
SHEATH GLIDE SLENDER 4/5FR (SHEATH) ×2 IMPLANT
TRANSDUCER W/STOPCOCK (MISCELLANEOUS) ×2 IMPLANT
TUBING ART PRESS 72  MALE/FEM (TUBING) ×1
TUBING ART PRESS 72 MALE/FEM (TUBING) ×1 IMPLANT

## 2017-09-20 NOTE — Progress Notes (Signed)
Pt returned from cath lab awake, alert and oriented. Coban wrap to right brachial with no noted bleeding. V/S being obtained and recorded. Pt offers no c/o discomfort other than usually "belly and back" pain. Will medicate as indicated.

## 2017-09-20 NOTE — Progress Notes (Signed)
ENEMA RESULTS:  Patient had approx 200cc liquid brown return from enema. No noted stool  in liquid.

## 2017-09-20 NOTE — Progress Notes (Signed)
Milk and molasses enema administered but patient did not retain first application.

## 2017-09-20 NOTE — H&P (View-Only) (Signed)
Progress Note  Patient Name: Denise [name]. Marvel Plan Date of Encounter: 09/20/2017  Primary Cardiologist: New to [facility]  Subjective   Pt with significant lower back and lower abdominal pain this AM. Will touch base with RN to assess pain medication schedule. Denies chest pain or SOB   Inpatient Medications    Scheduled Meds: . budesonide (PULMICORT) nebulizer solution  0.25 mg Nebulization BID  . calcitonin (salmon)  1 spray Alternating Nares Daily  . chlorhexidine  15 mL Mouth Rinse BID  . feeding supplement (GLUCERNA SHAKE)  237 mL Oral TID BM  . feeding supplement (PRO-STAT SUGAR FREE 64)  30 mL Oral BID  . Influenza vac split quadrivalent PF  0.5 mL Intramuscular Tomorrow-1000  . mouth rinse  15 mL Mouth Rinse q12n4p  . metoprolol tartrate  25 mg Oral BID  . mirtazapine  15 mg Oral QHS  . multivitamin with minerals  1 tablet Oral Daily  . pantoprazole  40 mg Oral Q0600  . senna  2 tablet Oral QHS  . sodium chloride flush  3 mL Intravenous Q12H   Continuous Infusions: . sodium chloride    . sodium chloride     PRN Meds: sodium chloride, acetaminophen **OR** acetaminophen, albuterol, hydrALAZINE, HYDROcodone-acetaminophen, morphine injection, ondansetron **OR** ondansetron (ZOFRAN) IV, sodium chloride flush   Vital Signs    Vitals:   09/19/17 2206 09/20/17 0433 09/20/17 0435 09/20/17 0821  BP: (!) 154/94 (!) 143/94 (!) 143/94 (!) 161/80  Pulse: 74 64 64 65  Resp:  _0 Temp:  98.7 F (37.1 C) 98.7 F (37.1 C)   TempSrc:  Oral Oral   SpO2:  98% 98% 100%  Weight:   54.7 kg   Height:        Intake/Output Summary (Last 24 hours) at 09/20/2017 0846 Last data filed at 09/19/2017 2300 Gross per 24 hour  Intake 750 ml  Output 201 ml  Net 549 ml   Filed Weights   09/17/17 0344 09/19/17 0509 09/20/17 0435  Weight: 57.2 kg 54.3 kg 54.7 kg    Physical Exam   General: Frail, elderly, NAD Skin: Warm, dry, intact  Head: Normocephalic, atraumatic,  clear,  moist mucus membranes. Neck: Negative for carotid bruits. No JVD Lungs:Clear to ausculation bilaterally. No wheezes, rales, or rhonchi. Breathing is unlabored. Cardiovascular: RRR with S1 S2. No murmurs, rubs, gallops, or LV heave appreciated. Abdomen: Soft, mildly tender, non-distended with normoactive bowel sounds.  No obvious abdominal masses. MSK: Strength and tone appear normal for age. 5/5 in all extremities Extremities: No edema. No clubbing or cyanosis. DP/PT pulses 2+ bilaterally Neuro: Alert and oriented. No focal deficits. No facial asymmetry. MAE spontaneously. Psych: Responds to questions appropriately with normal affect.    Labs    Chemistry Recent Labs  Lab 09/17/17 0350 09/18/17 0511 09/19/17 0627 09/20/17 0509  NA 143 142 141 141  K 3.6 3.4* 3.8 3.7  CL 104 102 102 102  CO2 _1 GLUCOSE 180* 105* 116* 125*  BUN _2 25*  CREATININE 1.00 1.05* 1.07* 0.87  CALCIUM 10.4* 11.0* 11.4* 10.9*  PROT 7.1 6.1* 6.3*  --   ALBUMIN 4.2 3.5 3.6  --   AST _3 --   ALT _4 --   ALKPHOS 116 89 92  --   BILITOT 1.0 1.2 1.0  --   GFRNONAA 55* 52* 51* >60  GFRAA >60 60* 59* >60  ANIONGAP  12 16* 11 9     Hematology Recent Labs  Lab 09/18/17 0511 09/19/17 0627 09/20/17 0509  WBC 8.4 7.9 6.8  RBC 5.22* 5.77* 5.20*  HGB 14.7 16.1* 14.5  HCT 45.5 51.0* 45.4  MCV 87.2 88.4 87.3  MCH 28.2 27.9 27.9  MCHC 32.3 31.6 31.9  RDW 15.1 15.4 14.8  PLT 260 270 276    Cardiac Enzymes Recent Labs  Lab 09/17/17 0350 09/17/17 0844 09/17/17 1439  TROPONINI <0.03 <0.03 <0.03    Recent Labs  Lab 09/16/17 2017  TROPIPOC 0.01     BNP Recent Labs  Lab 09/16/17 2005  BNP 310.4*     DDimer  Recent Labs  Lab 09/17/17 0350  DDIMER 0.90*     Radiology    Dg Chest 2 View  Result Date: 09/18/2017 CLINICAL DATA:  Short of breath history of COPD EXAM: CHEST - 2 VIEW COMPARISON:  09/16/2017, 06/02/2016 FINDINGS: Hyperinflation with emphysematous  disease. No focal consolidation or pleural effusion. Stable cardiomediastinal silhouette with aortic atherosclerosis. No pneumothorax. IMPRESSION: No active cardiopulmonary disease. Hyperinflation and emphysematous disease. Electronically Signed   By: Donavan Foil M.D.   On: 09/18/2017 14:52   Dg Esophagus  Result Date: 09/19/2017 CLINICAL DATA:  Epigastric pain, abnormal CT scan. EXAM: ESOPHOGRAM/BARIUM SWALLOW TECHNIQUE: Single contrast examination was performed using  thin barium. FLUOROSCOPY TIME:  Fluoroscopy Time:  2 minutes 0 seconds. Radiation Exposure Index (if provided by the fluoroscopic device): Number of Acquired Spot Images: 0 COMPARISON:  CT abdomen pelvis 09/17/2017. FINDINGS: There is poor esophageal motility. No esophageal fold thickening, stricture or obstruction. The distal esophagus, at the gastroesophageal junction, appears somewhat patulous. There may be an associated diverticulum. Esophagus is otherwise unremarkable. IMPRESSION: 1. Esophageal dysmotility. 2. Distal esophagus appears slightly patulous and there may be an associated diverticulum. Electronically Signed   By: Lorin Picket M.D.   On: 09/19/2017 12:12   Telemetry    09/20/17 NSR with frequent PACs - Personally Reviewed  ECG    No new tracing as of 09/20/17 - Personally Reviewed  Cardiac Studies   ECHO: 09/18/17: Study Conclusions  - Left ventricle: The cavity size was normal. There was moderate septal hypertrophy with otherwise mild concentric hypertrophy. Systolic function was normal. The estimated ejection fraction was in the range of 60% to 65%. There was dynamic obstruction at rest, with a peak velocity of 189 cm/sec and a peak gradient of 14 mm Hg. Wall motion was normal; there were no regional wall motion abnormalities. Doppler parameters are consistent with abnormal left ventricular relaxation (grade 1 diastolic dysfunction). - Aortic valve: Transvalvular velocity was within  the normal range. There was no stenosis. There was mild regurgitation. - Mitral valve: Transvalvular velocity was within the normal range. There was no evidence for stenosis. There was trivial regurgitation. - Right ventricle: The cavity size was normal. Wall thickness was normal. Systolic function was normal. - Atrial septum: No defect or patent foramen ovale was identified by color flow Doppler. - Tricuspid valve: There was mild regurgitation. - Pulmonary arteries: Systolic pressure was severely increased. PA peak pressure: 69 mm Hg (S).  CATH: None   Patient Profile     72 y.o. female  with a hx of COPD, hypertension, HLD, DM 2, colon cancer s/p resection and hyperparathyroidism scheduled for surgery next month who is being seen today for the evaluation of acute CHF exacerbation at the request of Dr. Tyrell Antonio.  Assessment & Plan    1. Dyspnea with concern for  significant PAH: -Echocardiogram performed 09/18/2017 in the setting of dyspnea found to have a normal LVEF with G1 DD however, significantly elevated PA peak pressures at 15mHg>>> likely in the setting of COPD   -VQ scan performed with no perfusion defect, no evidence of pulmonary embolus -Plan for RHC for definitive diagnosis of PAH (likely group 3 PH) today 09/20/17 for more accurate measurement of right heart pressures -Will need closer monitoring/treatment of COPD and possible continuous O2 supplementation at home -Plan for pulmonary consult after RHC to assist with treatment guidance   2.  Abdominal pain: -Unclear etiology, receiving pain medications per primary team>>in quite a bit of pain this AM   -Abnormal esophagus/abdomen on CT with concern for malignancy>>>esenteric edema, pericholecystic edema, mild intra-/extrahepatic ductal dilatation, focal PD narrowing per CT, MRCP -GI consult with recommendations for CHF consultation secondary to significant PAH thought to be etiology of ascites and  pericholecystic edema given no objective evidence of infectious process  3. Frequent PACs: -Pt having frequent PAC's per telemetry review, asymptomatic  -Low dose BB added yesterday  4.  Esophageal mass per CT: -Patient with a history of colon cancer s/p resection 1988 now found to have abnormal esophagus per CT this admission concerning for further malignancy -GI following  5.  Primary parathyroidism: -Parathyroidectomy scheduled for 10/02/2017 with Dr. RRosendo Gros-Concern is for significant PAH and upcoming surgery>>>question to proceed?   6.  DM2: -Stable, SSI for glucose control while inpatient status -Last hemoglobin A1c, 6.2 on 04/11/2017  7. Constipation: -Significant constipation on abdominal imaging>>could be source of back and lower abdominal pain -Given enema yesterday without resolution  -Will add MOM today    Signed, JKathyrn DrownNP-C [facility] Pager: [phone]/19/2019, 8:46 AM     For questions or updates, please contact   Please consult [email or web address] for contact info under Cardiology/STEMI.  Patient examined chart reviewed  Discussed right heart cath with patient and family at length yesterday Her abdominal pain is not from right heart failure or passive congestion would ask GI to w/u further . Her IVC is not very dilated on TTE and her RV is only mildly dilated with no morphologic signs of cor pulmonale  PJenkins Rouge

## 2017-09-20 NOTE — Progress Notes (Addendum)
Progress Note  Patient Name: Denise [name]. Marvel Plan Date of Encounter: 09/20/2017  Primary Cardiologist: New to [facility]  Subjective   Pt with significant lower back and lower abdominal pain this AM. Will touch base with RN to assess pain medication schedule. Denies chest pain or SOB   Inpatient Medications    Scheduled Meds: . budesonide (PULMICORT) nebulizer solution  0.25 mg Nebulization BID  . calcitonin (salmon)  1 spray Alternating Nares Daily  . chlorhexidine  15 mL Mouth Rinse BID  . feeding supplement (GLUCERNA SHAKE)  237 mL Oral TID BM  . feeding supplement (PRO-STAT SUGAR FREE 64)  30 mL Oral BID  . Influenza vac split quadrivalent PF  0.5 mL Intramuscular Tomorrow-1000  . mouth rinse  15 mL Mouth Rinse q12n4p  . metoprolol tartrate  25 mg Oral BID  . mirtazapine  15 mg Oral QHS  . multivitamin with minerals  1 tablet Oral Daily  . pantoprazole  40 mg Oral Q0600  . senna  2 tablet Oral QHS  . sodium chloride flush  3 mL Intravenous Q12H   Continuous Infusions: . sodium chloride    . sodium chloride     PRN Meds: sodium chloride, acetaminophen **OR** acetaminophen, albuterol, hydrALAZINE, HYDROcodone-acetaminophen, morphine injection, ondansetron **OR** ondansetron (ZOFRAN) IV, sodium chloride flush   Vital Signs    Vitals:   09/19/17 2206 09/20/17 0433 09/20/17 0435 09/20/17 0821  BP: (!) 154/94 (!) 143/94 (!) 143/94 (!) 161/80  Pulse: 74 64 64 65  Resp:  _0 Temp:  98.7 F (37.1 C) 98.7 F (37.1 C)   TempSrc:  Oral Oral   SpO2:  98% 98% 100%  Weight:   54.7 kg   Height:        Intake/Output Summary (Last 24 hours) at 09/20/2017 0846 Last data filed at 09/19/2017 2300 Gross per 24 hour  Intake 750 ml  Output 201 ml  Net 549 ml   Filed Weights   09/17/17 0344 09/19/17 0509 09/20/17 0435  Weight: 57.2 kg 54.3 kg 54.7 kg    Physical Exam   General: Frail, elderly, NAD Skin: Warm, dry, intact  Head: Normocephalic, atraumatic,  clear,  moist mucus membranes. Neck: Negative for carotid bruits. No JVD Lungs:Clear to ausculation bilaterally. No wheezes, rales, or rhonchi. Breathing is unlabored. Cardiovascular: RRR with S1 S2. No murmurs, rubs, gallops, or LV heave appreciated. Abdomen: Soft, mildly tender, non-distended with normoactive bowel sounds.  No obvious abdominal masses. MSK: Strength and tone appear normal for age. 5/5 in all extremities Extremities: No edema. No clubbing or cyanosis. DP/PT pulses 2+ bilaterally Neuro: Alert and oriented. No focal deficits. No facial asymmetry. MAE spontaneously. Psych: Responds to questions appropriately with normal affect.    Labs    Chemistry Recent Labs  Lab 09/17/17 0350 09/18/17 0511 09/19/17 0627 09/20/17 0509  NA 143 142 141 141  K 3.6 3.4* 3.8 3.7  CL 104 102 102 102  CO2 _1 GLUCOSE 180* 105* 116* 125*  BUN _2 25*  CREATININE 1.00 1.05* 1.07* 0.87  CALCIUM 10.4* 11.0* 11.4* 10.9*  PROT 7.1 6.1* 6.3*  --   ALBUMIN 4.2 3.5 3.6  --   AST _3 --   ALT _4 --   ALKPHOS 116 89 92  --   BILITOT 1.0 1.2 1.0  --   GFRNONAA 55* 52* 51* >60  GFRAA >60 60* 59* >60  ANIONGAP  12 16* 11 9     Hematology Recent Labs  Lab 09/18/17 0511 09/19/17 0627 09/20/17 0509  WBC 8.4 7.9 6.8  RBC 5.22* 5.77* 5.20*  HGB 14.7 16.1* 14.5  HCT 45.5 51.0* 45.4  MCV 87.2 88.4 87.3  MCH 28.2 27.9 27.9  MCHC 32.3 31.6 31.9  RDW 15.1 15.4 14.8  PLT 260 270 276    Cardiac Enzymes Recent Labs  Lab 09/17/17 0350 09/17/17 0844 09/17/17 1439  TROPONINI <0.03 <0.03 <0.03    Recent Labs  Lab 09/16/17 2017  TROPIPOC 0.01     BNP Recent Labs  Lab 09/16/17 2005  BNP 310.4*     DDimer  Recent Labs  Lab 09/17/17 0350  DDIMER 0.90*     Radiology    Dg Chest 2 View  Result Date: 09/18/2017 CLINICAL DATA:  Short of breath history of COPD EXAM: CHEST - 2 VIEW COMPARISON:  09/16/2017, 06/02/2016 FINDINGS: Hyperinflation with emphysematous  disease. No focal consolidation or pleural effusion. Stable cardiomediastinal silhouette with aortic atherosclerosis. No pneumothorax. IMPRESSION: No active cardiopulmonary disease. Hyperinflation and emphysematous disease. Electronically Signed   By: Donavan Foil M.D.   On: 09/18/2017 14:52   Dg Esophagus  Result Date: 09/19/2017 CLINICAL DATA:  Epigastric pain, abnormal CT scan. EXAM: ESOPHOGRAM/BARIUM SWALLOW TECHNIQUE: Single contrast examination was performed using  thin barium. FLUOROSCOPY TIME:  Fluoroscopy Time:  2 minutes 0 seconds. Radiation Exposure Index (if provided by the fluoroscopic device): Number of Acquired Spot Images: 0 COMPARISON:  CT abdomen pelvis 09/17/2017. FINDINGS: There is poor esophageal motility. No esophageal fold thickening, stricture or obstruction. The distal esophagus, at the gastroesophageal junction, appears somewhat patulous. There may be an associated diverticulum. Esophagus is otherwise unremarkable. IMPRESSION: 1. Esophageal dysmotility. 2. Distal esophagus appears slightly patulous and there may be an associated diverticulum. Electronically Signed   By: Lorin Picket M.D.   On: 09/19/2017 12:12   Telemetry    09/20/17 NSR with frequent PACs - Personally Reviewed  ECG    No new tracing as of 09/20/17 - Personally Reviewed  Cardiac Studies   ECHO: 09/18/17: Study Conclusions  - Left ventricle: The cavity size was normal. There was moderate septal hypertrophy with otherwise mild concentric hypertrophy. Systolic function was normal. The estimated ejection fraction was in the range of 60% to 65%. There was dynamic obstruction at rest, with a peak velocity of 189 cm/sec and a peak gradient of 14 mm Hg. Wall motion was normal; there were no regional wall motion abnormalities. Doppler parameters are consistent with abnormal left ventricular relaxation (grade 1 diastolic dysfunction). - Aortic valve: Transvalvular velocity was within  the normal range. There was no stenosis. There was mild regurgitation. - Mitral valve: Transvalvular velocity was within the normal range. There was no evidence for stenosis. There was trivial regurgitation. - Right ventricle: The cavity size was normal. Wall thickness was normal. Systolic function was normal. - Atrial septum: No defect or patent foramen ovale was identified by color flow Doppler. - Tricuspid valve: There was mild regurgitation. - Pulmonary arteries: Systolic pressure was severely increased. PA peak pressure: 69 mm Hg (S).  CATH: None   Patient Profile     72 y.o. female  with a hx of COPD, hypertension, HLD, DM 2, colon cancer s/p resection and hyperparathyroidism scheduled for surgery next month who is being seen today for the evaluation of acute CHF exacerbation at the request of Dr. Tyrell Antonio.  Assessment & Plan    1. Dyspnea with concern for  significant PAH: -Echocardiogram performed 09/18/2017 in the setting of dyspnea found to have a normal LVEF with G1 DD however, significantly elevated PA peak pressures at 64mHg>>> likely in the setting of COPD   -VQ scan performed with no perfusion defect, no evidence of pulmonary embolus -Plan for RHC for definitive diagnosis of PAH (likely group 3 PH) today 09/20/17 for more accurate measurement of right heart pressures -Will need closer monitoring/treatment of COPD and possible continuous O2 supplementation at home -Plan for pulmonary consult after RHC to assist with treatment guidance   2.  Abdominal pain: -Unclear etiology, receiving pain medications per primary team>>in quite a bit of pain this AM   -Abnormal esophagus/abdomen on CT with concern for malignancy>>>esenteric edema, pericholecystic edema, mild intra-/extrahepatic ductal dilatation, focal PD narrowing per CT, MRCP -GI consult with recommendations for CHF consultation secondary to significant PAH thought to be etiology of ascites and  pericholecystic edema given no objective evidence of infectious process  3. Frequent PACs: -Pt having frequent PAC's per telemetry review, asymptomatic  -Low dose BB added yesterday  4.  Esophageal mass per CT: -Patient with a history of colon cancer s/p resection 1988 now found to have abnormal esophagus per CT this admission concerning for further malignancy -GI following  5.  Primary parathyroidism: -Parathyroidectomy scheduled for 10/02/2017 with Dr. RRosendo Gros-Concern is for significant PAH and upcoming surgery>>>question to proceed?   6.  DM2: -Stable, SSI for glucose control while inpatient status -Last hemoglobin A1c, 6.2 on 04/11/2017  7. Constipation: -Significant constipation on abdominal imaging>>could be source of back and lower abdominal pain -Given enema yesterday without resolution  -Will add MOM today    Signed, JKathyrn DrownNP-C [facility] Pager: [phone]/19/2019, 8:46 AM     For questions or updates, please contact   Please consult [email or web address] for contact info under Cardiology/STEMI.  Patient examined chart reviewed  Discussed right heart cath with patient and family at length yesterday Her abdominal pain is not from right heart failure or passive congestion would ask GI to w/u further . Her IVC is not very dilated on TTE and her RV is only mildly dilated with no morphologic signs of cor pulmonale  PJenkins Rouge

## 2017-09-20 NOTE — Care Management Important Message (Signed)
Important Message  Patient Details  Name: Denise [name]. [place] MRN: [id number] Date of Birth: [birth year]   Medicare Important Message Given:  Yes    Charleen Madera [address] 09/20/2017, 3:03 PM

## 2017-09-20 NOTE — Interval H&P Note (Signed)
History and Physical Interval Note:  09/20/2017 2:58 PM  Denise [name]  has presented today for surgery, with the diagnosis of hp  The various methods of treatment have been discussed with the patient and family. After consideration of risks, benefits and other options for treatment, the patient has consented to  Procedure(s): RIGHT HEART CATH (N/A) as a surgical intervention .  The patient's history has been reviewed, patient examined, no change in status, stable for surgery.  I have reviewed the patient's chart and labs.  Questions were answered to the patient's satisfaction.     Sueo Cullen [employer]

## 2017-09-20 NOTE — Consult Note (Signed)
NAME:  Denise [name], MRN:  [id number], DOB:  22-Feb-1945, LOS: 3 ADMISSION DATE:  09/16/2017, CONSULTATION DATE:  09/20/2017 REFERRING MD:  Dr. Algis Liming, CHIEF COMPLAINT:  SOB/ epigastric pain   Brief History   72 year old female with PMH significant for but not limited to former smoker (quit 1989) COPD, DM, HLD, HTN, hyperparathyroidism, anxiety/ depression, and colon cancer s/p resection admitted 9/16 for exertional shortness of breath with epigastric pain over 1 week.  Admitted and worked up for COPD vs HF etiologies. Patient is scheduled for surgery with Dr. Justine Null on 10/1 for parathyroidectomy. GI consulted and felt GI issues related to non GI etiology.  Underwent RHC 9/19 which showed moderate pulmonary arterial hypertension.  PCCM consulted for pulmonary recommendations and surgery clearance.   Significant Hospital Events   9/15 admitted 9/19 RHC  Consults: date of consult/date signed off & final recs:  GI Cards/ HF PCCM   Procedures (surgical and bedside):  9/19 RHC  Significant Diagnostic Tests:  CT ABD 9/16 >> 1. Small bilateral pleural effusions with basilar atelectasis or consolidation. 2. Abnormal appearance of the distal esophagus and EG junction with poor definition of the esophageal wall and edema around the distal esophagus. Lymphadenopathy in the junction. Appearance is suspicious for esophageal neoplasm. Correlation with endoscopy is recommended. 3. Mild intra and extrahepatic bile duct dilatation without cause identified. Correlation with liver function studies is recommended. Consider follow-up with elective MRCP. 4. Mesenteric edema particularly in the pelvis. This could represent secondary changes due to enteritis. No evidence of bowel obstruction but bowel wall is not completely evaluated due to under distention. 5. Small amount of free fluid in the abdomen and pelvis.  9/16 NM VQ scan >> No perfusion defect, no evidence of pulmonary  embolus.  TTE 9/17 >> - Left ventricle: The cavity size was normal. There was moderate   septal hypertrophy with otherwise mild concentric hypertrophy.   Systolic function was normal. The estimated ejection fraction was   in the range of 60% to 65%. There was dynamic obstruction at   rest, with a peak velocity of 189 cm/sec and a peak gradient of   14 mm Hg. Wall motion was normal; there were no regional wall   motion abnormalities. Doppler parameters are consistent with   abnormal left ventricular relaxation (grade 1 diastolic   dysfunction). - Aortic valve: Transvalvular velocity was within the normal range.   There was no stenosis. There was mild regurgitation. - Mitral valve: Transvalvular velocity was within the normal range.   There was no evidence for stenosis. There was trivial   regurgitation. - Right ventricle: The cavity size was normal. Wall thickness was   normal. Systolic function was normal. - Atrial septum: No defect or patent foramen ovale was identified   by color flow Doppler. - Tricuspid valve: There was mild regurgitation. - Pulmonary arteries: Systolic pressure was severely increased. PA   peak pressure: 69 mm Hg (S).  CXR 9/17 >> No active cardiopulmonary disease. Hyperinflation and emphysematous Disease.  9/18 MBS>> 1. Esophageal dysmotility. 2. Distal esophagus appears slightly patulous and there may be an associated diverticulum.  9/19 RHC >> RA mean 3 RV 57/2 PA 57/16, mean 32 PCWP mean 5  Oxygen saturations: PA 51% AO 93% Cardiac Output (Fick) 2.6  Cardiac Index (Fick) 1.61 PVR 10 WU 1. Normal right and left heart filling pressures.  2. Moderate pulmonary arterial hypertension with very high PVR.  3. Low cardiac output.   Micro Data: none  Antimicrobials:  9/15 Levaquin x 1 9/16 zosyn x 1 9/17 unasyn x 1  Subjective:  C/o of mild ongoing lower abd pain which radiates to her back.  States she is constipated.   Objective   Blood  pressure (!) 152/94, pulse 71, temperature 98 F (36.7 C), temperature source Oral, resp. rate (!) 5, height _0  (1.676 m), weight 54.7 kg, SpO2 100 %.        Intake/Output Summary (Last 24 hours) at 09/20/2017 1644 Last data filed at 09/20/2017 1133 Gross per 24 hour  Intake 510 ml  Output 201 ml  Net 309 ml   Filed Weights   09/17/17 0344 09/19/17 0509 09/20/17 0435  Weight: 57.2 kg 54.3 kg 54.7 kg    Examination: General:  Thin appearing elderly female lying in bed in NAD HEENT: MM pink/moist, pupils equal reactive, no JVD Neuro: Alert, oriented, MAE, non focal  CV: RRR, no murmur, +2 distal pulses, R brachial site cdi PULM: even/non-labored on 3L [place], lungs bilaterally clear, no wheeze GI: soft, mildly tender in lower abd, bs +  Extremities: warm/dry, no edema  Skin: no rashes   Resolved Hospital Problem list    Assessment & Plan:  Acute hypoxic respiratory failure likely related to moderate pulmonary artery hypertension - neg VQ scan, normal CXR, minimal improvement after diuresis - 9/19 RHC as above P:  Per primary team and HF team   COPD without acute exacerbation - 09/11/16 Spirometry at that time showed FEV1 1.03 (52%) Ratio 54 mild curvature, on no rx and was continued on Symbicort. - no significant pulmonary issues/ exacerbations or O2 requirements since office visit 09/2016 P : Continue pulmicort, dose adjusted Add scheduled albuterol q 6, and continue PRN Await PFTs for surgery clearance  Remainder per primary team   Disposition / Summary of Today's Plan 09/20/17   Await PFTs     Labs   CBC: Recent Labs  Lab 09/16/17 2005 09/17/17 0350 09/18/17 0511 09/19/17 0627 09/20/17 0509  WBC 8.7 7.0 8.4 7.9 6.8  NEUTROABS 7.5 6.3  --   --   --   HGB 14.1 14.5 14.7 16.1* 14.5  HCT 44.0 44.1 45.5 51.0* 45.4  MCV 87.6 85.3 87.2 88.4 87.3  PLT 212 227 260 270 222   Basic Metabolic Panel: Recent Labs  Lab 09/16/17 2005 09/17/17 0350 09/18/17 0511  09/19/17 0627 09/20/17 0509  NA 141 143 142 141 141  K 3.5 3.6 3.4* 3.8 3.7  CL 106 104 102 102 102  CO2 _1 GLUCOSE 139* 180* 105* 116* 125*  BUN _2 25*  CREATININE 1.36* 1.00 1.05* 1.07* 0.87  CALCIUM 10.6* 10.4* 11.0* 11.4* 10.9*   GFR: Estimated Creatinine Clearance: 50.5 mL/min (by C-G formula based on SCr of 0.87 mg/dL). Recent Labs  Lab 09/17/17 0350 09/18/17 0511 09/18/17 1152 09/18/17 1401 09/19/17 0627 09/20/17 0509  PROCALCITON <0.10  --   --   --   --   --   WBC 7.0 8.4  --   --  7.9 6.8  LATICACIDVEN  --   --  1.0 0.9  --   --    Liver Function Tests: Recent Labs  Lab 09/16/17 2005 09/17/17 0350 09/18/17 0511 09/19/17 0627  AST _3 ALT _4 ALKPHOS 111 116 89 92  BILITOT 0.7 1.0 1.2 1.0  PROT 6.9 7.1 6.1* 6.3*  ALBUMIN 4.2 4.2 3.5 3.6  Recent Labs  Lab 09/16/17 2005  LIPASE 23   No results for input(s): AMMONIA in the last 168 hours. ABG No results found for: PHART, PCO2ART, PO2ART, HCO3, TCO2, ACIDBASEDEF, O2SAT  Coagulation Profile: No results for input(s): INR, PROTIME in the last 168 hours. Cardiac Enzymes: Recent Labs  Lab 09/17/17 0350 09/17/17 0844 09/17/17 1439  TROPONINI <0.03 <0.03 <0.03   HbA1C: Hgb A1c MFr Bld  Date/Time Value Ref Range Status  04/11/2017 11:13 AM 6.2 4.6 - 6.5 % Final    Comment:    Glycemic Control Guidelines for People with Diabetes:Non Diabetic:  <6%Goal of Therapy: <7%Additional Action Suggested:  >8%   06/02/2016 03:08 PM 6.0 (H) <5.7 % Final    Comment:      For someone without known diabetes, a hemoglobin A1c value between 5.7% and 6.4% is consistent with prediabetes and should be confirmed with a follow-up test.   For someone with known diabetes, a value <7% indicates that their diabetes is well controlled. A1c targets should be individualized based on duration of diabetes, age, co-morbid conditions and other considerations.   This assay result is  consistent with an increased risk of diabetes.   Currently, no consensus exists regarding use of hemoglobin A1c for diagnosis of diabetes in children.      CBG: Recent Labs  Lab 09/19/17 2132 09/20/17 0005 09/20/17 0430 09/20/17 0749 09/20/17 1205  GLUCAP 105* 161* 140* 130* 175*    Admitting History of Present Illness.   72 year old female with PMH significant for but not limited to former smoker (quit 1989) COPD, DM, HLD, HTN, hyperparathyroidism, anxiety/ depression, and colon cancer s/p resection admitted 9/16 for exertional shortness of breath with epigastric pain over 1 week.  Additionally she reports ~20-25 lb weight loss over the last several months in which she attributes to her hyperparathyroidism and wakes up during the night gasping for air.   Seen in office by Dr. Melvyn Novas once, 09/11/2016.  Spirometry at that time showed FEV1 1.03 (52%) Ratio 54 mild curvature, on no rx and was continued on Symbicort.   Patient is scheduled for surgery with Dr. Justine Null on 10/1 for parathyroidectomy. She was admitted to [facility] for further workup of possible COPD vs heart failure workup.  Noted on CXR to have cardiomegaly with pulmonary vascular redistribution and mild interstitial edema with small left pleural effusion.  Initially started on unasyn but this as been since discontinued.  CT abdominal showed abnormal appearance of distal esophagus and EG junction suspicious for neoplasm, and mild intra and extrahepatic bile duct dilation.  LFTs were normal. VQ scan without perfusion defect.  TTE noted elevated PAP of 69 mmHg with normal LV and RV function, and G1DD.  GI and Cardiology consulted. Patient was diuresed.  Underwent RHC 9/19 which demonstrated moderate pulmonary arterial hypertension.  PCCM consulted for pulmonary clearance for surgery.    Review of Systems:   Review of Systems  Constitutional: Positive for weight loss. Negative for chills and fever.  Respiratory: Positive for shortness of  breath. Negative for cough, hemoptysis, sputum production and wheezing.   Cardiovascular: Positive for PND. Negative for chest pain and leg swelling.  Gastrointestinal: Positive for abdominal pain and constipation.   Past medical history  She,  has a past medical history of Altered mental status, Anxiety and depression, Colon cancer ([place]) (1988), COPD (chronic obstructive pulmonary disease) ([place]), Diabetes mellitus without complication ([place]), Diverticulosis, Hyperlipidemia, Hypertension, Insomnia, Primary hyperparathyroidism ([place]), Tubular adenoma of rectum (02/23/2013), and Vitamin D  deficiency.   Surgical History    Past Surgical History:  Procedure Laterality Date  . ABDOMINAL HYSTERECTOMY  08/2015  . BREAST BIOPSY Left   . BREAST EXCISIONAL BIOPSY Right   . COLON RESECTION  1980's  . COLONOSCOPY    . ENDOMETRIAL BIOPSY  2009   negative  . INCONTINENCE SURGERY  2017     Social History   Social History   Socioeconomic History  . Marital status: Married    Spouse name: Not on file  . Number of children: 3  . Years of education: Not on file  . Highest education level: Not on file  Occupational History  . Not on file  Social Needs  . Financial resource strain: Not on file  . Food insecurity:    Worry: Not on file    Inability: Not on file  . Transportation needs:    Medical: Not on file    Non-medical: Not on file  Tobacco Use  . Smoking status: Former Smoker    Types: Cigarettes    Last attempt to quit: 1989    Years since quitting: 30.7  . Smokeless tobacco: Never Used  Substance and Sexual Activity  . Alcohol use: No  . Drug use: No  . Sexual activity: Yes    Partners: Male    Birth control/protection: Post-menopausal, Surgical  Lifestyle  . Physical activity:    Days per week: Not on file    Minutes per session: Not on file  . Stress: Not on file  Relationships  . Social connections:    Talks on phone: Not on file    Gets together: Not on file     Attends religious service: Not on file    Active member of club or organization: Not on file    Attends meetings of clubs or organizations: Not on file    Relationship status: Not on file  . Intimate partner violence:    Fear of current or ex partner: Not on file    Emotionally abused: Not on file    Physically abused: Not on file    Forced sexual activity: Not on file  Other Topics Concern  . Not on file  Social History Narrative   Married. 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 Her smoking use included cigarettes. She has never used smokeless tobacco. She reports that she does not drink alcohol or use drugs.   Family history   Her family history includes Ovarian cancer in her mother. There is no history of Breast cancer, Hyperparathyroidism, Colon cancer, Esophageal cancer, or Stomach cancer.   Allergies No Known Allergies  Home meds  Prior to Admission medications   Medication Sig Start Date End Date Taking? Authorizing Provider  albuterol (PROVENTIL HFA;VENTOLIN HFA) 108 (90 Base) MCG/ACT inhaler Inhale 1-2 puffs into the lungs every 4 (four) hours as needed for wheezing or shortness of breath. 05/22/17  Yes [name], NP  aspirin EC 81 MG tablet Take 81 mg by mouth daily.   Yes [provider]  dicyclomine (BENTYL) 10 MG capsule TAKE 1 CAPSULE BY MOUTH THREE TIMES DAILY BEFORE A MEAL AS NEEDED FOR STOMACH CRAMPING. Patient taking differently: Take 10 mg by mouth See admin instructions. Take 1 capsule (10 mg)  by mouth three times daily before  a meal as needed for stomach cramping. 06/19/17  Yes [name], NP  Fluticasone-Salmeterol [facility]) 250-50 MCG/DOSE AEPB Inhale 1 puff into the lungs 2 (two) times daily as needed (shortness of breath/wheezing).    Yes [provider]  losartan (COZAAR) 100 MG  tablet Take 1 tablet by mouth once daily for blood pressure. Patient taking differently: Take 100 mg by mouth daily. for blood pressure. 05/22/17  Yes [name], NP  Melatonin 5 MG TABS Take 5 mg by mouth at bedtime as needed (sleep).   Yes [provider]  mirtazapine (REMERON) 15 MG tablet Take 2 tablets by mouth at bedtime for depression and appetite. Patient taking differently: Take 15 mg by mouth at bedtime. for depression and appetite. 06/19/17  Yes [name], NP  naproxen sodium (ALEVE) 220 MG tablet Take 220-440 mg by mouth daily as needed (pain).   Yes [provider]  NIFEdipine (PROCARDIA XL/ADALAT-CC) 60 MG 24 hr tablet TAKE 1 TABLET BY MOUTH ONCE DAILY FOR BLOOD PRESSURE Patient taking differently: Take 60 mg by mouth daily. FOR BLOOD PRESSURE 07/13/17  Yes [name], NP  sertraline (ZOLOFT) 25 MG tablet Take 1 tablet by mouth once daily for depression. Patient taking differently: Take 25 mg by mouth daily. for depression. 05/22/17  Yes [name], NP  atorvastatin (LIPITOR) 10 MG tablet Take 1 tablet by mouth at bedtime for cholesterol. Patient not taking: Reported on 09/16/2017 05/22/17   [name], NP  Fluticasone-Salmeterol (ADVAIR DISKUS) 250-50 MCG/DOSE AEPB Inhale 1 puff into the lungs 2 (two) times daily. Patient not taking: Reported on 09/16/2017 05/16/17   [name], NP  omeprazole (PRILOSEC) 20 MG capsule Take 1 capsule (20 mg total) by mouth daily before breakfast. About 30 mins before Patient not taking: Reported on 09/16/2017 06/21/17   Gatha Mayer, MD     Kennieth Rad, AGACNP-BC [place] Pgr: [phone] or if no answer [phone] 09/20/2017, 5:38 PM

## 2017-09-20 NOTE — Progress Notes (Signed)
PROGRESS NOTE    Denise [name]  NWG:[id number] DOB: 09/30/1945 DOA: 09/16/2017 PCP: [name], NP    Brief Narrative: Denise [name] is a 72 y.o. female with history of COPD, hypertension, colon cancer status post resection, hyperparathyroidism scheduled for surgery next month presented to the ER with complaints of increasing exertional shortness of breath over the last 1 week with epigastric pain.   ED Course: In the ER chest x-ray shows pleural effusion and congestion and possible consolidation per the CAT scan.  CT abdomen pelvis done shows possible mass in the distal esophagus and also intra-and extrahepatic ductal dilation.  LFTs are normal.  Patient in the ER was given nebulizer treatment steroids Lasix for COPD versus CHF treatment.    Admitted for further work-up for the possible esophageal mass and epigastric pain.  In the ER patient was found to have urinary retention and was placed on Foley catheter.  [facility] and cardiology consulting.   Assessment & [name]:   Principal Problem:   Acute respiratory failure with hypoxia ([name]) Active Problems:   COPD GOLD II    Essential hypertension   Hyperparathyroidism, primary ([place])   History of colon cancer   Epigastric pain   Protein-calorie malnutrition, severe   Abnormal CT scan, gastrointestinal tract   Constipation   Other ascites   Acute Hypoxic respiratory Failure;  Unclear etiology.  Former smoker.  TTE 9/17 showed normal LVEF, grade 1 diastolic dysfunction and pulmonary hypertension with PA peak pressure 69 mmHg.  VQ scan showed no perfusion defect.  Low index of concern for pneumonia, Unasyn discontinued.  No clinical bronchospasm/COPD exacerbation.  Treated with IV Lasix, -3.1 L thus far and weight down by approximately 4.5 pounds since admission.  As per GI, they doubt that her GI symptoms are primary GI and suspect more due to pulmonary hypertension/cor pulmonale and hence recommended Cardiology  consultation.  Cardiology follow-up appreciated, do not think that her abdominal pain is from heart failure or passive congestion and I agree.  Underwent right heart cath 9/19: Moderate pulmonary artery hypertension.  Rest of details as below.  Moderate pulmonary artery hypertension Evaluation as indicated above.  Needs PFTs to determine how severe her COPD is.  Patient is supposed to have parathyroidectomy 10/1.  Given that, requested pulmonology to see patient in house and make recommendations.  Abdominal pain/abnormal CT abdomen findings;  Unclear etiology.  [facility] following and their input appreciated.  Abnormal esophagus noted on CT.  Esophagogram 9/18 showed esophageal dysmotility, distal esophagus appears slightly patulous and there may be an associated diverticulum.  EGD 06/2017 had shown normal esophagus and fundal gastritis.  Now on PPI.  As per GI, abnormal CT abdomen findings (mesenteric edema, pericholecystic edema, mild intra-/extrahepatic ductal dilatation, focal PD narrowing per CT, MRCP) may be due to right heart failure and do not represent cholecystitis.  Other findings felt to be artifact per Dr. Celesta Aver discussion with radiology.  GI recommended cardiology input.  Unasyn discontinued.  Patient had smog enema with small output 9/18.  Ongoing abdominal pain.  Reviewed CT abdomen from 9/16, significant stool filled colon.  Trial of milk and molasses enema today.  I discussed with Dr. Carlean Purl, if no further explanation for abdominal pain may consider colonoscopy.  Constipation: Status post smog enema 9/18 with small output.  Continue aggressive bowel regimen.  Complicated by hypercalcemia.  Trial of enema again today.  Esophageal dysmotility: Evaluation and management as per GI.  Please see above.  On PPI.  CT abdomen had questioned esophageal mass.  I discussed with Dr. Carlean Purl, GI on 9/19 and she does not have esophageal mass.  Urinary retention; foley catheter.  Voiding  without difficulty.  However given abdominal pain, will check bladder scan to make sure she does not have urinary retention.  Primary hyperparathyroidism, hypercalcemia;  Parathyroidectomy set for 10/02/17 with Dr Rosendo Gros.   Hold IV fluids to avoid pulmonary edema.  Started calcitonin.?  Consider bisphosphonates.  Calcium has improved from 11.4-10.9.  Continue to monitor.  Hypokalemia; replaced.  Elevated anion gap;  Resolved.  Essential hypertension:  Continue Toprol.  PRN IV hydralazine.   DVT prophylaxis: SCD Code Status: Full code.  Family Communication: None at bedside. Disposition [name]: DC home pending clinical improvement.   Consultants:   GI  Cardiology  Pulmonology   Procedures:  ECHO; - Left ventricle: The cavity size was normal. There was moderate   septal hypertrophy with otherwise mild concentric hypertrophy.   Systolic function was normal. The estimated ejection fraction was   in the range of 60% to 65%. There was dynamic obstruction at   rest, with a peak velocity of 189 cm/sec and a peak gradient of   14 mm Hg. Wall motion was normal; there were no regional wall   motion abnormalities. Doppler parameters are consistent with   abnormal left ventricular relaxation (grade 1 diastolic   dysfunction). - Aortic valve: Transvalvular velocity was within the normal range.   There was no stenosis. There was mild regurgitation. - Mitral valve: Transvalvular velocity was within the normal range.   There was no evidence for stenosis. There was trivial   regurgitation. - Right ventricle: The cavity size was normal. Wall thickness was   normal. Systolic function was normal. - Atrial septum: No defect or patent foramen ovale was identified   by color flow Doppler. - Tricuspid valve: There was mild regurgitation. - Pulmonary arteries: Systolic pressure was severely increased. PA    peak pressure: 69 mm Hg (S).   Right heart cath 09/20/2017  1. Normal right and  left heart filling pressures.  2. Moderate pulmonary arterial hypertension with very high PVR.  3. Low cardiac output.    Significant PAH. This may be group 3 PH in setting of COPD given history.  V/Q scan was not suggestive of chronic PEs.  She needs PFTs to see how severe COPD is.  If pulmonary hypertension looks out of proportion to severity of COPD, would consider treatment.    I am not sure that cardiac output as obtained is correct.  Oxygen saturation was up and down in the cath lab, we used a pulse ox saturation.  It is possible saturation was truly lower and CO higher.   Antimicrobials:  Unasyn 9-17-discontinued   Subjective: Seen this morning prior to procedure.  Ongoing periumbilical abdominal pain, reports 8/10 in severity, continuous, unable to describe quality, radiating to back.  No nausea or vomiting.  No chest pain or dyspnea.  Objective: Vitals:   09/20/17 1518 09/20/17 1523 09/20/17 1528 09/20/17 1528  BP: (!) 187/105   (!) 162/89  Pulse: 72 (!) 0 (!) 0 (!) 0  Resp: 12 (!) 0 (!) 0 (!) 5  Temp:      TempSrc:      SpO2: (!) 0% (!) 0% (!) 0% (!) 0%  Weight:      Height:      Pulse rate 66/min, oxygen saturation 100%, temperature 98 F.  Intake/Output Summary (Last 24 hours) at 09/20/2017  [place] filed at 09/20/2017 1133 Gross per 24 hour  Intake 510 ml  Output 201 ml  Net 309 ml   Filed Weights   09/17/17 0344 09/19/17 0509 09/20/17 0435  Weight: 57.2 kg 54.3 kg 54.7 kg    Examination:  General exam: Pleasant elderly female, moderately built, frail and thinly nourished.  She was seen ambulating with assistance in the room this morning. Respiratory system: Clear to auscultation. Respiratory effort normal. Cardiovascular system: S1 & S2 heard, RRR. No JVD, murmurs, rubs, gallops or clicks. No pedal edema.  Telemetry personally reviewed: SR. Gastrointestinal system: Abdomen is nondistended, soft.  Mild periumbilical tenderness without rigidity,  guarding or rebound.  Normal bowel sounds heard.  No change/stable. Central nervous system: Alert and oriented. No focal neurological deficits.  Stable. Extremities: Symmetric 5 x 5 power. Skin: No rashes, lesions or ulcers Psychiatry: Judgement and insight appear normal. Mood & affect appropriate.     Data Reviewed: I have personally reviewed following labs and imaging studies  CBC: Recent Labs  Lab 09/16/17 2005 09/17/17 0350 09/18/17 0511 09/19/17 0627 09/20/17 0509  WBC 8.7 7.0 8.4 7.9 6.8  NEUTROABS 7.5 6.3  --   --   --   HGB 14.1 14.5 14.7 16.1* 14.5  HCT 44.0 44.1 45.5 51.0* 45.4  MCV 87.6 85.3 87.2 88.4 87.3  PLT 212 227 260 270 111   Basic Metabolic Panel: Recent Labs  Lab 09/16/17 2005 09/17/17 0350 09/18/17 0511 09/19/17 0627 09/20/17 0509  NA 141 143 142 141 141  K 3.5 3.6 3.4* 3.8 3.7  CL 106 104 102 102 102  CO2 _0 GLUCOSE 139* 180* 105* 116* 125*  BUN _1 25*  CREATININE 1.36* 1.00 1.05* 1.07* 0.87  CALCIUM 10.6* 10.4* 11.0* 11.4* 10.9*   GFR: Estimated Creatinine Clearance: 50.5 mL/min (by C-G formula based on SCr of 0.87 mg/dL). Liver Function Tests: Recent Labs  Lab 09/16/17 2005 09/17/17 0350 09/18/17 0511 09/19/17 0627  AST _2 ALT _3 ALKPHOS 111 116 89 92  BILITOT 0.7 1.0 1.2 1.0  PROT 6.9 7.1 6.1* 6.3*  ALBUMIN 4.2 4.2 3.5 3.6   Recent Labs  Lab 09/16/17 2005  LIPASE 23   Cardiac Enzymes: Recent Labs  Lab 09/17/17 0350 09/17/17 0844 09/17/17 1439  TROPONINI <0.03 <0.03 <0.03   CBG: Recent Labs  Lab 09/19/17 2132 09/20/17 0005 09/20/17 0430 09/20/17 0749 09/20/17 1205  GLUCAP 105* 161* 140* 130* 175*   Sepsis Labs: Recent Labs  Lab 09/17/17 0350 09/18/17 1152 09/18/17 1401  PROCALCITON <0.10  --   --   LATICACIDVEN  --  1.0 0.9    No results found for this or any previous visit (from the past 240 hour(s)).       Radiology Studies: Dg Esophagus  Result Date:  09/19/2017 CLINICAL DATA:  Epigastric pain, abnormal CT scan. EXAM: ESOPHOGRAM/BARIUM SWALLOW TECHNIQUE: Single contrast examination was performed using  thin barium. FLUOROSCOPY TIME:  Fluoroscopy Time:  2 minutes 0 seconds. Radiation Exposure Index (if provided by the fluoroscopic device): Number of Acquired Spot Images: 0 COMPARISON:  CT abdomen pelvis 09/17/2017. FINDINGS: There is poor esophageal motility. No esophageal fold thickening, stricture or obstruction. The distal esophagus, at the gastroesophageal junction, appears somewhat patulous. There may be an associated diverticulum. Esophagus is otherwise unremarkable. IMPRESSION: 1. Esophageal dysmotility. 2. Distal esophagus appears slightly patulous and there may be an associated diverticulum. Electronically  Signed   By: Lorin Picket M.D.   On: 09/19/2017 12:12        Scheduled Meds: . budesonide (PULMICORT) nebulizer solution  0.25 mg Nebulization BID  . calcitonin (salmon)  1 spray Alternating Nares Daily  . chlorhexidine  15 mL Mouth Rinse BID  . feeding supplement (GLUCERNA SHAKE)  237 mL Oral TID BM  . feeding supplement (PRO-STAT SUGAR FREE 64)  30 mL Oral BID  . Influenza vac split quadrivalent PF  0.5 mL Intramuscular Tomorrow-1000  . mouth rinse  15 mL Mouth Rinse q12n4p  . metoprolol tartrate  25 mg Oral BID  . milk and molasses  1 enema Rectal Once  . mirtazapine  15 mg Oral QHS  . multivitamin with minerals  1 tablet Oral Daily  . pantoprazole  40 mg Oral Q0600  . senna  2 tablet Oral QHS   Continuous Infusions:    LOS: 3 days    Time spent: 35 minutes    Vernell Leep, MD, FACP, [facility]. [facility] Pager [phone]  If 7PM-7AM, please contact night-coverage [email or web address] Password [facility] 09/20/2017, 3:47 PM

## 2017-09-21 ENCOUNTER — Inpatient Hospital Stay (HOSPITAL_COMMUNITY): Payer: Medicare HMO

## 2017-09-21 ENCOUNTER — Encounter: Payer: Self-pay | Admitting: Internal Medicine

## 2017-09-21 ENCOUNTER — Encounter (HOSPITAL_COMMUNITY): Payer: Self-pay | Admitting: Cardiology

## 2017-09-21 DIAGNOSIS — J438 Other emphysema: Secondary | ICD-10-CM

## 2017-09-21 DIAGNOSIS — E210 Primary hyperparathyroidism: Secondary | ICD-10-CM

## 2017-09-21 LAB — BASIC METABOLIC PANEL
Anion gap: 14 (ref 5–15)
BUN: 20 mg/dL (ref 8–23)
CALCIUM: 11.3 mg/dL — AB (ref 8.9–10.3)
CO2: 29 mmol/L (ref 22–32)
CREATININE: 0.76 mg/dL (ref 0.44–1.00)
Chloride: 99 mmol/L (ref 98–111)
GFR calc Af Amer: >60 mL/min (ref >60)
Glucose, Bld: 146 mg/dL — ABNORMAL HIGH (ref 70–99)
Potassium: 3.9 mmol/L (ref 3.5–5.1)
SODIUM: 142 mmol/L (ref 135–145)

## 2017-09-21 LAB — GLUCOSE, CAPILLARY
GLUCOSE-CAPILLARY: 167 mg/dL — AB (ref 70–99)
Glucose-Capillary: 170 mg/dL — ABNORMAL HIGH (ref 70–99)
Glucose-Capillary: 136 mg/dL — ABNORMAL HIGH (ref 70–99)
Glucose-Capillary: 187 mg/dL — ABNORMAL HIGH (ref 70–99)
Glucose-Capillary: 151 mg/dL — ABNORMAL HIGH (ref 70–99)

## 2017-09-21 MED ORDER — SODIUM CHLORIDE 0.9 % IV SOLN
40.0000 mg | Freq: Two times a day (BID) | INTRAVENOUS | Status: DC
  Administered 2017-09-22 – 2017-09-23 (×5): 40 mg via INTRAVENOUS
  Filled 2017-09-21 (×6): qty 4

## 2017-09-21 MED ORDER — METOCLOPRAMIDE HCL 5 MG/ML IJ SOLN
10.0000 mg | Freq: Once | INTRAMUSCULAR | Status: AC
  Administered 2017-09-21: 10 mg via INTRAVENOUS
  Filled 2017-09-21: qty 2

## 2017-09-21 MED ORDER — POLYETHYLENE GLYCOL 3350 17 G PO PACK: 17.0000 g | PACK | Freq: Once | ORAL | Status: DC

## 2017-09-21 MED ORDER — IOPAMIDOL (ISOVUE-300) INJECTION 61%
INTRAVENOUS | Status: AC
  Filled 2017-09-21: qty 30

## 2017-09-21 MED ORDER — KETOROLAC TROMETHAMINE 30 MG/ML IJ SOLN
15.0000 mg | Freq: Four times a day (QID) | INTRAMUSCULAR | Status: DC
  Administered 2017-09-21 – 2017-09-23 (×5): 15 mg via INTRAVENOUS
  Filled 2017-09-21 (×6): qty 1

## 2017-09-21 MED ORDER — KETOROLAC TROMETHAMINE 30 MG/ML IJ SOLN
30.0000 mg | Freq: Once | INTRAMUSCULAR | Status: AC
  Administered 2017-09-21: 30 mg via INTRAVENOUS
  Filled 2017-09-21: qty 1

## 2017-09-21 MED ORDER — POLYETHYLENE GLYCOL 3350 17 G PO PACK
68.0000 g | PACK | Freq: Once | ORAL | Status: AC
  Administered 2017-09-21: 68 g via ORAL
  Filled 2017-09-21: qty 3
  Filled 2017-09-21: qty 4

## 2017-09-21 MED ORDER — IOHEXOL 300 MG/ML  SOLN
100.0000 mL | Freq: Once | INTRAMUSCULAR | Status: AC | PRN
  Administered 2017-09-21: 100 mL via INTRAVENOUS

## 2017-09-21 MED ORDER — BISACODYL 5 MG PO TBEC
20.0000 mg | DELAYED_RELEASE_TABLET | Freq: Once | ORAL | Status: AC
  Administered 2017-09-21: 20 mg via ORAL
  Filled 2017-09-21: qty 4

## 2017-09-21 NOTE — Progress Notes (Addendum)
PROGRESS NOTE    [name] Plan  ONG:[id number] DOB: [birth year] DOA: 09/16/2017 PCP: [name], NP    Brief Narrative: [name]. Bryden is a 72 y.o. female with history of COPD, hypertension, colon cancer status post resection, hyperparathyroidism scheduled for surgery next month presented to the ER with complaints of increasing exertional shortness of breath over the last 1 week with epigastric pain.   ED Course: In the ER chest x-ray shows pleural effusion and congestion and possible consolidation per the CAT scan.  CT abdomen pelvis done shows possible mass in the distal esophagus and also intra-and extrahepatic ductal dilation.  LFTs are normal.  Patient in the ER was given nebulizer treatment steroids Lasix for COPD versus CHF treatment.    Admitted for further work-up for the possible esophageal mass and epigastric pain.  In the ER patient was found to have urinary retention and was placed on Foley catheter.  Hospital course complicated by ongoing abdominal pain of unclear etiology.  [facility] seeing, initially did not think this was primary hepatobiliary or GI related and suspected due to right heart failure and cor pulmonale.  Cardiology was consulted, status post right heart cath and do not feel that her abdominal symptoms are related to cardiac issues.  Pulmonology consulted for moderate pulmonary artery hypertension.  Surgical team (due to have parathyroid surgery 10/1) alerted of patient's admission on 9/19.  Assessment & Plan:   Principal Problem:   Acute respiratory failure with hypoxia ([name]) Active Problems:   Pulmonary emphysema ([name])   Essential hypertension   Hyperparathyroidism, primary ([place])   History of colon cancer   Epigastric pain   Protein-calorie malnutrition, severe   Abnormal CT scan, gastrointestinal tract   Constipation   Other ascites   Pulmonary hypertension ([name])   Acute Hypoxic respiratory Failure;  Unclear etiology.  Former  smoker.  TTE 9/17 showed normal LVEF, grade 1 diastolic dysfunction and pulmonary hypertension with PA peak pressure 69 mmHg.  VQ scan showed no perfusion defect.  Low index of concern for pneumonia, Unasyn discontinued.  No clinical bronchospasm/COPD exacerbation.  Treated with IV Lasix and clinically appears compensated.   Pulmonology follow-up appreciated.  Recommend that since concern with cor pulmonale, would keep on oxygen for 3 months with goal of >90% at all times, follow-up in office, no role for vasodilator therapy but can be followed up during outpatient pulmonology visit with Dr. Lake Bells.  Gold stage II COPD Pulmonology input appreciated.  Pulmonology and cardiology indicate that her degree of cor pulmonale does not explain her abdominal pain. Pulmonology also recommend not to do parathyroid surgery until her abdominal and back pain symptoms have been evaluated and resolved. They recommend putting back on Advair 250 mg, 1 puff twice daily and as needed [place] at discharge.  Moderate pulmonary artery hypertension Cardiology was consulted and patient underwent [place] 9/19 with findings as below (moderate by right heart cath).  VQ scan negative.  Pulmonology was consulted and input appreciated.  Needs spirometry and PFTs.  Abdominal pain/abnormal CT abdomen findings;  Unclear etiology.  [facility] following and their input appreciated.  Abnormal esophagus noted on CT.  Esophagogram 9/18 showed esophageal dysmotility, distal esophagus appears slightly patulous and there may be an associated diverticulum.  EGD 06/2017 had shown normal esophagus and fundal gastritis.  Now on PPI.  As per GI, abnormal CT abdomen findings (mesenteric edema, pericholecystic edema, mild intra-/extrahepatic ductal dilatation, focal PD narrowing per CT, MRCP) may be due to right heart failure  and do not represent cholecystitis.  Other findings felt to be artifact per Dr. Celesta Aver discussion with radiology.  GI recommended  cardiology input and as noted above.  No significant BM despite 2 days of enema 9/18 and 19.  Ongoing abdominal/low back pain of unclear etiology.  KUB 9/20: Suggests significantly delayed small bowel transit and partial small bowel obstruction cannot be excluded.  Discussed with Dr. Carlean Purl, trial of MiraLAX purge.  Constipation: Complicated by hypercalcemia.  No significant relief with enemas x2 days.  Please see discussion above and management per GI.  Esophageal dysmotility: Evaluation and management as per GI.  Please see above.  On PPI.  CT abdomen had questioned esophageal mass.  I discussed with Dr. Carlean Purl, GI on 9/19 and she does not have esophageal mass.  Urinary retention; foley catheter.  Voiding without difficulty.  Bladder scan 9/19 did not show urinary retention.  Primary hyperparathyroidism, hypercalcemia;  Parathyroidectomy set for 10/02/17 with Dr Rosendo Gros.   No IV fluids to avoid pulmonary edema.  Started calcitonin.  Calcium is back up and no significant improvement.   She has mild hypercalcemia and I am not sure if her degree of hypercalcemia is the cause for her abdominal pain.  Hypokalemia; replaced.  Elevated anion gap;  Resolved.  Essential hypertension:  Continue Toprol.  PRN IV hydralazine.   DVT prophylaxis: SCD Code Status: Full code.  Family Communication: None at bedside. Disposition Plan: DC home pending clinical improvement.   Consultants:   GI  Cardiology  Pulmonology   Procedures:  ECHO; - Left ventricle: The cavity size was normal. There was moderate   septal hypertrophy with otherwise mild concentric hypertrophy.   Systolic function was normal. The estimated ejection fraction was   in the range of 60% to 65%. There was dynamic obstruction at   rest, with a peak velocity of 189 cm/sec and a peak gradient of   14 mm Hg. Wall motion was normal; there were no regional wall   motion abnormalities. Doppler parameters are consistent with    abnormal left ventricular relaxation (grade 1 diastolic   dysfunction). - Aortic valve: Transvalvular velocity was within the normal range.   There was no stenosis. There was mild regurgitation. - Mitral valve: Transvalvular velocity was within the normal range.   There was no evidence for stenosis. There was trivial   regurgitation. - Right ventricle: The cavity size was normal. Wall thickness was   normal. Systolic function was normal. - Atrial septum: No defect or patent foramen ovale was identified   by color flow Doppler. - Tricuspid valve: There was mild regurgitation. - Pulmonary arteries: Systolic pressure was severely increased. PA    peak pressure: 69 mm Hg (S).   Right heart cath 09/20/2017  1. Normal right and left heart filling pressures.  2. Moderate pulmonary arterial hypertension with very high PVR.  3. Low cardiac output.    Significant PAH. This may be group 3 PH in setting of COPD given history.  V/Q scan was not suggestive of chronic PEs.  She needs PFTs to see how severe COPD is.  If pulmonary hypertension looks out of proportion to severity of COPD, would consider treatment.    I am not sure that cardiac output as obtained is correct.  Oxygen saturation was up and down in the cath lab, we used a pulse ox saturation.  It is possible saturation was truly lower and CO higher.   Antimicrobials:  Unasyn 9-17-discontinued   Subjective: As per discussion  with RN, no significant response to enema 9/19.  Ongoing lower abdominal pain/low back pain.  No nausea or vomiting.  Decreased appetite.  Upon chart review, seen by PCP in May and had 2 to 3 months history of abdominal pain at that time too.   Objective: Vitals:   09/21/17 0051 09/21/17 0447 09/21/17 0915 09/21/17 0916  BP:  (!) 158/91    Pulse:  73    Resp:  16    Temp:  98.4 F (36.9 C)    TempSrc:  Oral    SpO2: 100% 100% 97% 97%  Weight:  55 kg    Height:        Intake/Output Summary (Last 24  hours) at 09/21/2017 1230 Last data filed at 09/21/2017 1145 Gross per 24 hour  Intake -  Output 400 ml  Net -400 ml   Filed Weights   09/19/17 0509 09/20/17 0435 09/21/17 0447  Weight: 54.3 kg 54.7 kg 55 kg    Examination:  General exam: Pleasant elderly female, moderately built, frail and thinly nourished lying uncomfortably supine in bed with mild painful distress. Respiratory system: Clear to auscultation. Respiratory effort normal.  Stable. Cardiovascular system: S1 & S2 heard, RRR. No JVD, murmurs, rubs, gallops or clicks. No pedal edema.  Telemetry personally reviewed: SR.  5 beat NSVT 9/20 at 9:24 AM and 24 beat NSVT on 9/19 at 4:26 PM. Gastrointestinal system: Abdomen is nondistended, soft, mild periumbilical tenderness without rigidity, guarding or rebound.  No organomegaly or masses appreciated.  Normal bowel sounds heard. Central nervous system: Alert and oriented. No focal neurological deficits.  Stable. Extremities: Symmetric 5 x 5 power. Skin: No rashes, lesions or ulcers Psychiatry: Judgement and insight appear normal. Mood & affect appropriate.     Data Reviewed: I have personally reviewed following labs and imaging studies  CBC: Recent Labs  Lab 09/16/17 2005 09/17/17 0350 09/18/17 0511 09/19/17 0627 09/20/17 0509  WBC 8.7 7.0 8.4 7.9 6.8  NEUTROABS 7.5 6.3  --   --   --   HGB 14.1 14.5 14.7 16.1* 14.5  HCT 44.0 44.1 45.5 51.0* 45.4  MCV 87.6 85.3 87.2 88.4 87.3  PLT 212 227 260 270 258   Basic Metabolic Panel: Recent Labs  Lab 09/17/17 0350 09/18/17 0511 09/19/17 0627 09/20/17 0509 09/21/17 0633  NA 143 142 141 141 142  K 3.6 3.4* 3.8 3.7 3.9  CL 104 102 102 102 99  CO2 _0 GLUCOSE 180* 105* 116* 125* 146*  BUN _1 25* 20  CREATININE 1.00 1.05* 1.07* 0.87 0.76  CALCIUM 10.4* 11.0* 11.4* 10.9* 11.3*   GFR: Estimated Creatinine Clearance: 55.2 mL/min (by C-G formula based on SCr of 0.76 mg/dL). Liver Function Tests: Recent  Labs  Lab 09/16/17 2005 09/17/17 0350 09/18/17 0511 09/19/17 0627  AST _2 ALT _3 ALKPHOS 111 116 89 92  BILITOT 0.7 1.0 1.2 1.0  PROT 6.9 7.1 6.1* 6.3*  ALBUMIN 4.2 4.2 3.5 3.6   Recent Labs  Lab 09/16/17 2005  LIPASE 23   Cardiac Enzymes: Recent Labs  Lab 09/17/17 0350 09/17/17 0844 09/17/17 1439  TROPONINI <0.03 <0.03 <0.03   CBG: Recent Labs  Lab 09/20/17 2006 09/21/17 0048 09/21/17 0607 09/21/17 0744 09/21/17 1143  GLUCAP 152* 167* 170* 136* 187*   Sepsis Labs: Recent Labs  Lab 09/17/17 0350 09/18/17 1152 09/18/17 1401  PROCALCITON <0.10  --   --  LATICACIDVEN  --  1.0 0.9    No results found for this or any previous visit (from the past 240 hour(s)).       Radiology Studies: Dg Abd Portable 1v  Result Date: 09/21/2017 CLINICAL DATA:  Follow-up constipation EXAM: PORTABLE ABDOMEN - 1 VIEW COMPARISON:  09/17/2017 FINDINGS: Previously administered contrast material is again noted within the stomach but also within the small bowel. Mildly dilated small bowel is noted. No free air is seen. No bony abnormality is noted. IMPRESSION: Contrast material scattered throughout the small bowel from recent esophagram. This suggests significantly delayed small bowel transit time. Partial small bowel obstruction could not be excluded Electronically Signed   By: Inez Catalina M.D.   On: 09/21/2017 09:08        Scheduled Meds: . albuterol  2.5 mg Nebulization Q6H  . budesonide (PULMICORT) nebulizer solution  0.5 mg Nebulization BID  . calcitonin (salmon)  1 spray Alternating Nares Daily  . chlorhexidine  15 mL Mouth Rinse BID  . feeding supplement (GLUCERNA SHAKE)  237 mL Oral TID BM  . feeding supplement (PRO-STAT SUGAR FREE 64)  30 mL Oral BID  . Influenza vac split quadrivalent PF  0.5 mL Intramuscular Tomorrow-1000  . mouth rinse  15 mL Mouth Rinse q12n4p  . metoprolol tartrate  25 mg Oral BID  . mirtazapine  15 mg Oral QHS  .  multivitamin with minerals  1 tablet Oral Daily  . pantoprazole  40 mg Oral Q0600  . senna  2 tablet Oral QHS   Continuous Infusions:    LOS: 4 days    Time spent: 35 minutes    Vernell Leep, MD, FACP, [facility]. [facility] Pager [phone]  If 7PM-7AM, please contact night-coverage [email or web address] Password [email or web address] 09/21/2017, 12:30 PM

## 2017-09-21 NOTE — Telephone Encounter (Signed)
  This encounter was created in error - please disregard.

## 2017-09-21 NOTE — Progress Notes (Signed)
   Patient not seen yet but chart reviewed  Looks like will need pulmonary to see  I ordered a DG abdomen portable  Anticipate we should have her do a MiraLax purge = bisacodyl 20 mg - wait 1 hor then drink 4 doses of Miralax in 1 hour  Gatha Mayer, MD, [facility] 09/21/2017 6:36 AM [phone]

## 2017-09-21 NOTE — Progress Notes (Signed)
Patient consumed both bottles of contrast as requested by Radiology.

## 2017-09-21 NOTE — Progress Notes (Signed)
   Patient Name: Denise [name]. Marvel Plan Date of Encounter: 09/21/2017, 4:58 PM    Subjective  KUB w/ retained contrast and some SB dilation Nausea C/o lower abdomen and back pain still She did not have much out with enemas and now nothing out with bisacodyl and 4 doses MiraLAx   Objective  BP (!) 178/101   Pulse 76   Temp 98.4 F (36.9 C) (Oral)   Resp 16   Ht _0  (1.676 m)   Wt 55 kg Comment:  c  SpO2 99%   BMI 19.56 kg/m  Mild distress abd protuberant, distended and mild-mod tender, BS+ Eyes anivcteric Legs no edema  Lab Results  Component Value Date   WBC 6.8 09/20/2017   HGB 14.5 09/20/2017   HCT 45.4 09/20/2017   MCV 87.3 09/20/2017   PLT 276 09/20/2017   Lab Results  Component Value Date   CREATININE 0.76 09/21/2017   BUN 20 09/21/2017   NA 142 09/21/2017   K 3.9 09/21/2017   CL 99 09/21/2017   CO2 29 09/21/2017       Assessment and Plan  Constipation Lower abdominal pain Dilated small bowel COPD PAH Hyperparathyroid and hypercalcemic   Prior CT, MR unrevealing overall except the ascites Will try Toradol - ? Narcotics worsening constipation Recheck CT   Gatha Mayer, MD, [facility] 09/21/2017 4:58 PM Pager [phone]

## 2017-09-21 NOTE — Progress Notes (Signed)
All ordered efforts administered to patient in effort to promote BM. No results from laxative agents at this time. Patient is requesting pain med, specifically morphine.  Tried to advise patient to get out of bed and walk the halls. Advised patient that with the meds for pain it is complicating the constipation issue.  Patient is requesting morphine.

## 2017-09-21 NOTE — Consult Note (Signed)
PULMONARY / CRITICAL CARE MEDICINE   NAME:  Denise [name], MRN:  [id number], DOB:  20-Jun-1945, LOS: 4 ADMISSION DATE:  09/16/2017, CONSULTATION DATE:  09/21/17  REFERRING MD:  triad, CHIEF COMPLAINT:  Sob/ [place]  BRIEF HISTORY:    72 yobm quit smoking with GOLD II copd seen by my 09/11/16 with Group B symptoms but doing ok on symbicort 160 2bid and did not return for f/u then much worse spells of sob occuring at rest ? p stopped symbicort rx only prn saba and admitted 09/16/17 with sob at rest assoc with abd swelling/ back pain with w/u concerning for WHO III PH so pccm service consulted 09/21/17    HISTORY OF PRESENT ILLNESS   Pt says just as sob at rest "gasping" as she is walking s assoc cough  And only mild leg swelling and note though she has [place] by echo 09/18/17 her RA size is nl and RV ok  With nl R ht filling pressure documented 9/19   But she does not feel her abd swelling is any better at all at this point though breathing better since on 02  In terms of sob:  No obvious day to day or daytime variability or assoc excess/ purulent sputum or mucus plugs or hemoptysis or cp or chest tightness, subjective wheeze or overt sinus or hb symptoms.     Also denies any obvious fluctuation of symptoms with weather or environmental changes or other aggravating or alleviating factors except as outlined above   No unusual exposure hx or h/o childhood pna/ asthma or knowledge of premature birth.  Current Allergies, Complete Past Medical History, Past Surgical History, Family History, and Social History were reviewed in [employer] record.  ROS  The following are not active complaints unless bolded Hoarseness, sore throat, dysphagia, dental problems, itching, sneezing,  nasal congestion or discharge of excess mucus or purulent secretions, ear ache,   fever, chills, sweats, unintended wt loss or wt gain, classically pleuritic or exertional cp,  orthopnea pnd or arm/hand swelling  or  leg swelling, presyncope, palpitations, abdominal pain, anorexia, nausea, vomiting, diarrhea  or change in bowel habits= constipation  or change in bladder habits, change in stools or change in urine, dysuria, hematuria,  rash, arthralgias, visual complaints, headache, numbness, weakness or ataxia or problems with walking or coordination,  change in mood or  memory.          SIGNIFICANT PAST MEDICAL HISTORY   COPD  GOLD II  - Spirometry 09/11/2016  FEV1 1.03 (52%)  Ratio 54 mild curvature, on no rx Colon Ca Primary Hyperparathyroidism   SIGNIFICANT EVENTS:    STUDIES:    V/ q low prob 09/17/17  Echo 9/47/65  Grade 1 diastolic dysfunction with nl RA size but severe increase in PAS RHC 09/20/17  ? PVR  10 woods units though CO calculation suspect and RV diastolic pressure = 2 - PFT's ordered        CONSULTANTS:  GI 9/16 Cards 9/18    SUBJECTIVE:  Appears comfortable at rest in chair on 02 =  3lpm   CONSTITUTIONAL: BP (!) 158/91   Pulse 73   Temp 98.4 F (36.9 C) (Oral)   Resp 16   Ht _0  (1.676 m)   Wt 55 kg Comment:  c  SpO2 97%   BMI 19.56 kg/m    Intake/Output Summary (Last 24 hours) at 09/21/2017 1149 Last data filed at 09/21/2017 1145 Gross per 24 hour  Intake -  Output 400 ml  Net -400 ml          PHYSICAL EXAM: General:  Chronically ill bf nad Neuro:  Alert/ approp s def HEENT:  orophx clear Cardiovascular:  RRR no s3 Lungs:  Distant bs s  wheeze Abdomen:  Mod distended , decreased bs Musculoskeletal:  ? Mild muscle wasting  Skin:  No rash/ breakdown     ASSESSMENT AND PLAN   1) GOLD II copd with emphysematous features and likely longstanding well compensated cor pulmonale  - rx with nebs/02  but avoid atrovent in setting of chronic constipation  - Agree with Dr Johnsie Cancel this degree of cor pulmonale does not explain her abd pain especially  since RV pressures are so low and RA size nl on Echo  - Would not do parathyroid surgery until work out the  cause of her back and abdominal symptoms   - on discharge ok to put back on advair 250 one bid and prn saba    2) ? New onset hypoxemic resp failure  Assoc with cor pulmonale "new" dx - V/ q low prob 09/17/17  - since concern with cor pulmonale would keep on 02 for 3 months with goal of > 90% at all times and follow serially in office  - no role from my perspective for vasodilator therapy but would welcome opinion from Dr Lake Bells regarding longterm rx if we can address her immediate concerns and get her back to office setting         LABS  Glucose Recent Labs  Lab 09/20/17 1205 09/20/17 1655 09/20/17 2006 09/21/17 0048 09/21/17 0607 09/21/17 0744  GLUCAP 175* 126* 152* 167* 170* 136*    BMET Recent Labs  Lab 09/19/17 0627 09/20/17 0509 09/21/17 0633  NA 141 141 142  K 3.8 3.7 3.9  CL 102 102 99  CO2 _0 BUN 23 25* 20  CREATININE 1.07* 0.87 0.76  GLUCOSE 116* 125* 146*    Liver Enzymes Recent Labs  Lab 09/17/17 0350 09/18/17 0511 09/19/17 0627  AST _1 ALT _2 ALKPHOS 116 89 92  BILITOT 1.0 1.2 1.0  ALBUMIN 4.2 3.5 3.6    Electrolytes Recent Labs  Lab 09/19/17 0627 09/20/17 0509 09/21/17 0633  CALCIUM 11.4* 10.9* 11.3*    CBC Recent Labs  Lab 09/18/17 0511 09/19/17 0627 09/20/17 0509  WBC 8.4 7.9 6.8  HGB 14.7 16.1* 14.5  HCT 45.5 51.0* 45.4  PLT 260 270 276    ABG No results for input(s): PHART, PCO2ART, PO2ART in the last 168 hours.  Coag's No results for input(s): APTT, INR in the last 168 hours.  Sepsis Markers Recent Labs  Lab 09/17/17 0350 09/18/17 1152 09/18/17 1401  LATICACIDVEN  --  1.0 0.9  PROCALCITON <0.10  --   --     Cardiac Enzymes Recent Labs  Lab 09/17/17 0350 09/17/17 0844 09/17/17 1439  TROPONINI <0.03 <0.03 <0.03    PAST MEDICAL HISTORY :   She  has a past medical history of Altered mental status, Anxiety and depression, Colon cancer ([place]) (1988), COPD (chronic obstructive  pulmonary disease) ([place]), Diabetes mellitus without complication ([place]), Diverticulosis, Hyperlipidemia, Hypertension, Insomnia, Primary hyperparathyroidism ([place]), Tubular adenoma of rectum (02/23/2013), and Vitamin D deficiency.  PAST SURGICAL HISTORY:  She  has a past surgical history that includes Abdominal hysterectomy (08/2015); Colon resection (1980's); Endometrial biopsy (2009); Incontinence surgery (2017); Breast excisional biopsy (Right); Breast biopsy (Left); Colonoscopy; and RIGHT HEART  CATH (N/A, 09/20/2017).  No Known Allergies  No current facility-administered medications on file prior to encounter.    Current Outpatient Medications on File Prior to Encounter  Medication Sig  . albuterol (PROVENTIL HFA;VENTOLIN HFA) 108 (90 Base) MCG/ACT inhaler Inhale 1-2 puffs into the lungs every 4 (four) hours as needed for wheezing or shortness of breath.  [name] aspirin EC 81 MG tablet Take 81 mg by mouth daily.  [name] dicyclomine (BENTYL) 10 MG capsule TAKE 1 CAPSULE BY MOUTH THREE TIMES DAILY BEFORE A MEAL AS NEEDED FOR STOMACH CRAMPING. (Patient taking differently: Take 10 mg by mouth See admin instructions. Take 1 capsule (10 mg)  by mouth three times daily before a meal as needed for stomach cramping.)  . Fluticasone-Salmeterol (WIXELA INHUB) 250-50 MCG/DOSE AEPB Inhale 1 puff into the lungs 2 (two) times daily as needed (shortness of breath/wheezing).   [name] losartan (COZAAR) 100 MG tablet Take 1 tablet by mouth once daily for blood pressure. (Patient taking differently: Take 100 mg by mouth daily. for blood pressure.)  . Melatonin 5 MG TABS Take 5 mg by mouth at bedtime as needed (sleep).  . mirtazapine (REMERON) 15 MG tablet Take 2 tablets by mouth at bedtime for depression and appetite. (Patient taking differently: Take 15 mg by mouth at bedtime. for depression and appetite.)  . naproxen sodium (ALEVE) 220 MG tablet Take 220-440 mg by mouth daily as needed (pain).  [name] NIFEdipine (PROCARDIA  XL/ADALAT-CC) 60 MG 24 hr tablet TAKE 1 TABLET BY MOUTH ONCE DAILY FOR BLOOD PRESSURE (Patient taking differently: Take 60 mg by mouth daily. FOR BLOOD PRESSURE)  . sertraline (ZOLOFT) 25 MG tablet Take 1 tablet by mouth once daily for depression. (Patient taking differently: Take 25 mg by mouth daily. for depression.)  . atorvastatin (LIPITOR) 10 MG tablet Take 1 tablet by mouth at bedtime for cholesterol. (Patient not taking: Reported on 09/16/2017)  . Fluticasone-Salmeterol (ADVAIR DISKUS) 250-50 MCG/DOSE AEPB Inhale 1 puff into the lungs 2 (two) times daily. (Patient not taking: Reported on 09/16/2017)  . omeprazole (PRILOSEC) 20 MG capsule Take 1 capsule (20 mg total) by mouth daily before breakfast. About 30 mins before (Patient not taking: Reported on 09/16/2017)    FAMILY HISTORY:   Her family history includes Ovarian cancer in her mother. There is no history of Breast cancer, Hyperparathyroidism, Colon cancer, Esophageal cancer, or Stomach cancer.  SOCIAL HISTORY:  She  reports that she quit smoking about 30 years ago. Her smoking use included cigarettes. She has never used smokeless tobacco. She reports that she does not drink alcohol or use drugs.

## 2017-09-21 NOTE — Plan of Care (Signed)
  Problem: Education: Goal: Knowledge of General Education information will improve Description Including pain rating scale, medication(s)/side effects and non-pharmacologic comfort measures 09/21/2017 0107 by Anson Fret, RN Outcome: Progressing Note:  POC reviewed with pt. and pain management.

## 2017-09-21 NOTE — Progress Notes (Signed)
Informed pt. that GI put order in for NGT now and pt. states not now that she may agree to have tube placed later.

## 2017-09-21 NOTE — Progress Notes (Signed)
Progress Note  Patient Name: Denise [name]. Marvel Plan Date of Encounter: 09/21/2017  Primary Cardiologist: New to [place] of pain from abdomen and back this am   Inpatient Medications    Scheduled Meds: . albuterol  2.5 mg Nebulization Q6H  . budesonide (PULMICORT) nebulizer solution  0.5 mg Nebulization BID  . calcitonin (salmon)  1 spray Alternating Nares Daily  . chlorhexidine  15 mL Mouth Rinse BID  . feeding supplement (GLUCERNA SHAKE)  237 mL Oral TID BM  . feeding supplement (PRO-STAT SUGAR FREE 64)  30 mL Oral BID  . Influenza vac split quadrivalent PF  0.5 mL Intramuscular Tomorrow-1000  . mouth rinse  15 mL Mouth Rinse q12n4p  . metoprolol tartrate  25 mg Oral BID  . mirtazapine  15 mg Oral QHS  . multivitamin with minerals  1 tablet Oral Daily  . pantoprazole  40 mg Oral Q0600  . senna  2 tablet Oral QHS   Continuous Infusions:  PRN Meds: acetaminophen **OR** acetaminophen, albuterol, hydrALAZINE, HYDROcodone-acetaminophen, magnesium hydroxide, morphine injection, ondansetron **OR** ondansetron (ZOFRAN) IV   Vital Signs    Vitals:   09/20/17 1912 09/20/17 1926 09/21/17 0051 09/21/17 0447  BP:  (!) 157/88  (!) 158/91  Pulse:  77  73  Resp:  16  16  Temp:  98.3 F (36.8 C)  98.4 F (36.9 C)  TempSrc:  Oral  Oral  SpO2: 99% 100% 100% 100%  Weight:    55 kg  Height:        Intake/Output Summary (Last 24 hours) at 09/21/2017 0912 Last data filed at 09/20/2017 1133 Gross per 24 hour  Intake 0 ml  Output -  Net 0 ml   Filed Weights   09/19/17 0509 09/20/17 0435 09/21/17 0447  Weight: 54.3 kg 54.7 kg 55 kg    Physical Exam   General: Frail, elderly, NAD Skin: Warm, dry, intact  Head: Normocephalic, atraumatic,  clear, moist mucus membranes. Neck: Negative for carotid bruits. No JVD Lungs:Clear to ausculation bilaterally. No wheezes, rales, or rhonchi. Breathing is unlabored. Cardiovascular: RRR with S1 S2. No murmurs,  rubs, gallops, or LV heave appreciated. Abdomen: Soft, mildly tender, non-distended with normoactive bowel sounds.  No obvious abdominal masses. MSK: Strength and tone appear normal for age. 5/5 in all extremities Extremities: No edema. No clubbing or cyanosis. DP/PT pulses 2+ bilaterally Neuro: Alert and oriented. No focal deficits. No facial asymmetry. MAE spontaneously. Psych: Responds to questions appropriately with normal affect.    Labs    Chemistry Recent Labs  Lab 09/17/17 0350 09/18/17 0511 09/19/17 0627 09/20/17 0509 09/21/17 0633  NA 143 142 141 141 142  K 3.6 3.4* 3.8 3.7 3.9  CL 104 102 102 102 99  CO2 _0 GLUCOSE 180* 105* 116* 125* 146*  BUN _1 25* 20  CREATININE 1.00 1.05* 1.07* 0.87 0.76  CALCIUM 10.4* 11.0* 11.4* 10.9* 11.3*  PROT 7.1 6.1* 6.3*  --   --   ALBUMIN 4.2 3.5 3.6  --   --   AST _2 --   --   ALT _3 --   --   ALKPHOS 116 89 92  --   --   BILITOT 1.0 1.2 1.0  --   --   GFRNONAA 55* 52* 51* >60 >60  GFRAA >60 60* 59* >60 >60  ANIONGAP 12 16* 11 9 14  Hematology Recent Labs  Lab 09/18/17 0511 09/19/17 0627 09/20/17 0509  WBC 8.4 7.9 6.8  RBC 5.22* 5.77* 5.20*  HGB 14.7 16.1* 14.5  HCT 45.5 51.0* 45.4  MCV 87.2 88.4 87.3  MCH 28.2 27.9 27.9  MCHC 32.3 31.6 31.9  RDW 15.1 15.4 14.8  PLT 260 270 276    Cardiac Enzymes Recent Labs  Lab 09/17/17 0350 09/17/17 0844 09/17/17 1439  TROPONINI <0.03 <0.03 <0.03    Recent Labs  Lab 09/16/17 2017  TROPIPOC 0.01     BNP Recent Labs  Lab 09/16/17 2005  BNP 310.4*     DDimer  Recent Labs  Lab 09/17/17 0350  DDIMER 0.90*     Radiology    Dg Esophagus  Result Date: 09/19/2017 CLINICAL DATA:  Epigastric pain, abnormal CT scan. EXAM: ESOPHOGRAM/BARIUM SWALLOW TECHNIQUE: Single contrast examination was performed using  thin barium. FLUOROSCOPY TIME:  Fluoroscopy Time:  2 minutes 0 seconds. Radiation Exposure Index (if provided by the  fluoroscopic device): Number of Acquired Spot Images: 0 COMPARISON:  CT abdomen pelvis 09/17/2017. FINDINGS: There is poor esophageal motility. No esophageal fold thickening, stricture or obstruction. The distal esophagus, at the gastroesophageal junction, appears somewhat patulous. There may be an associated diverticulum. Esophagus is otherwise unremarkable. IMPRESSION: 1. Esophageal dysmotility. 2. Distal esophagus appears slightly patulous and there may be an associated diverticulum. Electronically Signed   By: Lorin Picket M.D.   On: 09/19/2017 12:12   Dg Abd Portable 1v  Result Date: 09/21/2017 CLINICAL DATA:  Follow-up constipation EXAM: PORTABLE ABDOMEN - 1 VIEW COMPARISON:  09/17/2017 FINDINGS: Previously administered contrast material is again noted within the stomach but also within the small bowel. Mildly dilated small bowel is noted. No free air is seen. No bony abnormality is noted. IMPRESSION: Contrast material scattered throughout the small bowel from recent esophagram. This suggests significantly delayed small bowel transit time. Partial small bowel obstruction could not be excluded Electronically Signed   By: Inez Catalina M.D.   On: 09/21/2017 09:08   Telemetry    09/20/17 NSR with frequent PACs - Personally Reviewed  ECG    No new tracing as of 09/20/17 - Personally Reviewed  Cardiac Studies   ECHO: 09/18/17: Study Conclusions  - Left ventricle: The cavity size was normal. There was moderate septal hypertrophy with otherwise mild concentric hypertrophy. Systolic function was normal. The estimated ejection fraction was in the range of 60% to 65%. There was dynamic obstruction at rest, with a peak velocity of 189 cm/sec and a peak gradient of 14 mm Hg. Wall motion was normal; there were no regional wall motion abnormalities. Doppler parameters are consistent with abnormal left ventricular relaxation (grade 1 diastolic dysfunction). - Aortic valve:  Transvalvular velocity was within the normal range. There was no stenosis. There was mild regurgitation. - Mitral valve: Transvalvular velocity was within the normal range. There was no evidence for stenosis. There was trivial regurgitation. - Right ventricle: The cavity size was normal. Wall thickness was normal. Systolic function was normal. - Atrial septum: No defect or patent foramen ovale was identified by color flow Doppler. - Tricuspid valve: There was mild regurgitation. - Pulmonary arteries: Systolic pressure was severely increased. PA peak pressure: 69 mm Hg (S).  CATH: None   Patient Profile     72 y.o. female  with a hx of COPD, hypertension, HLD, DM 2, colon cancer s/p resection and hyperparathyroidism scheduled for surgery next month who is being seen today for the evaluation of acute  CHF exacerbation at the request of Dr. Tyrell Antonio.  Assessment & Plan    1. Dyspnea with concern for significant PAH: Who class 3 pulmonary HTN only moderate by right heart cath  With PA pressure 57/16 mmHg and PCWP only 5 mmHg.  Dr Claris Gladden notes indicate further pulmonary w/u will have spirometry V/Q with no embolus will f/u with Dr Aundra Dubin as outpatient to consider vasodilator Rx   2.  Abdominal pain: - clearly not from right heart failure Poor response to enema's Plan per GI and Dr Carlean Purl Portable Xray this am cannot exclude partial SBO   3. Frequent PACs: -Pt having frequent PAC's per telemetry review, asymptomatic  -Low dose BB added yesterday  4.  Esophageal mass per CT: -Patient with a history of colon cancer s/p resection 1988 now found to have abnormal esophagus per CT this admission concerning for further malignancy -GI following  5.  Primary parathyroidism: -Parathyroidectomy scheduled for 10/02/2017 with Dr. Rosendo Gros -Madaline Brilliant to proceed with surgery from cardiology perspective   6.  DM2: -Stable, SSI for glucose control while inpatient status -Last hemoglobin  A1c, 6.2 on 04/11/2017  7. Constipation: - ongoing plan per GI      Jenkins Rouge

## 2017-09-22 ENCOUNTER — Inpatient Hospital Stay (HOSPITAL_COMMUNITY): Payer: Medicare HMO

## 2017-09-22 DIAGNOSIS — E876 Hypokalemia: Secondary | ICD-10-CM

## 2017-09-22 DIAGNOSIS — I472 Ventricular tachycardia: Secondary | ICD-10-CM

## 2017-09-22 DIAGNOSIS — K56609 Unspecified intestinal obstruction, unspecified as to partial versus complete obstruction: Secondary | ICD-10-CM

## 2017-09-22 LAB — BASIC METABOLIC PANEL
Anion gap: 15 (ref 5–15)
BUN: 25 mg/dL — AB (ref 8–23)
CO2: 31 mmol/L (ref 22–32)
Calcium: 12 mg/dL — ABNORMAL HIGH (ref 8.9–10.3)
Chloride: 94 mmol/L — ABNORMAL LOW (ref 98–111)
Creatinine, Ser: 0.95 mg/dL (ref 0.44–1.00)
GFR calc Af Amer: >60 mL/min (ref >60)
GFR calc non Af Amer: 58 mL/min — ABNORMAL LOW (ref >60)
Glucose, Bld: 142 mg/dL — ABNORMAL HIGH (ref 70–99)
POTASSIUM: 3.3 mmol/L — AB (ref 3.5–5.1)
Sodium: 140 mmol/L (ref 135–145)

## 2017-09-22 LAB — GLUCOSE, CAPILLARY
GLUCOSE-CAPILLARY: 166 mg/dL — AB (ref 70–99)
GLUCOSE-CAPILLARY: 153 mg/dL — AB (ref 70–99)
GLUCOSE-CAPILLARY: 129 mg/dL — AB (ref 70–99)
GLUCOSE-CAPILLARY: 131 mg/dL — AB (ref 70–99)
GLUCOSE-CAPILLARY: 141 mg/dL — AB (ref 70–99)
Glucose-Capillary: 135 mg/dL — ABNORMAL HIGH (ref 70–99)
Glucose-Capillary: 122 mg/dL — ABNORMAL HIGH (ref 70–99)

## 2017-09-22 LAB — MAGNESIUM: Magnesium: 2.1 mg/dL (ref 1.7–2.4)

## 2017-09-22 MED ORDER — POTASSIUM CHLORIDE 10 MEQ/100ML IV SOLN
10.0000 meq | INTRAVENOUS | Status: DC
  Administered 2017-09-22: 10 meq via INTRAVENOUS
  Filled 2017-09-22 (×2): qty 100

## 2017-09-22 MED ORDER — ALBUTEROL SULFATE (2.5 MG/3ML) 0.083% IN NEBU
2.5000 mg | INHALATION_SOLUTION | Freq: Three times a day (TID) | RESPIRATORY_TRACT | Status: DC
  Administered 2017-09-22: 2.5 mg via RESPIRATORY_TRACT
  Filled 2017-09-22: qty 3

## 2017-09-22 MED ORDER — POTASSIUM CHLORIDE 20 MEQ PO PACK
40.0000 meq | PACK | ORAL | Status: DC
  Administered 2017-09-22: 40 meq via ORAL
  Filled 2017-09-22 (×3): qty 2

## 2017-09-22 MED ORDER — SODIUM CHLORIDE 0.9 % IV SOLN
INTRAVENOUS | Status: DC
  Administered 2017-09-22: 15:00:00 via INTRAVENOUS

## 2017-09-22 MED ORDER — ALBUTEROL SULFATE (2.5 MG/3ML) 0.083% IN NEBU
2.5000 mg | INHALATION_SOLUTION | Freq: Two times a day (BID) | RESPIRATORY_TRACT | Status: DC
  Administered 2017-09-22 – 2017-09-24 (×4): 2.5 mg via RESPIRATORY_TRACT
  Filled 2017-09-22 (×4): qty 3

## 2017-09-22 MED ORDER — ALBUTEROL SULFATE (2.5 MG/3ML) 0.083% IN NEBU: 2.5000 mg | INHALATION_SOLUTION | RESPIRATORY_TRACT | Status: DC | PRN

## 2017-09-22 MED ORDER — METOPROLOL TARTRATE 5 MG/5ML IV SOLN
5.0000 mg | Freq: Three times a day (TID) | INTRAVENOUS | Status: DC
  Administered 2017-09-22 – 2017-09-23 (×2): 5 mg via INTRAVENOUS
  Filled 2017-09-22 (×3): qty 5

## 2017-09-22 NOTE — Progress Notes (Signed)
PROGRESS NOTE    Denise [name]  BWI:[id number] DOB: 08/24/1945 DOA: 09/16/2017 PCP: Denise Koch, NP    Brief Narrative: Denise [name] is a 72 y.o. female with history of COPD, hypertension, colon cancer status post resection, hyperparathyroidism scheduled for surgery next month presented to the ER with complaints of increasing exertional shortness of breath over the last 1 week with epigastric pain.   ED Course: In the ER chest x-ray shows pleural effusion and congestion and possible consolidation per the CAT scan.  CT abdomen pelvis done shows possible mass in the distal esophagus and also intra-and extrahepatic ductal dilation.  LFTs are normal.  Patient in the ER was given nebulizer treatment steroids Lasix for COPD versus CHF treatment.    Admitted for further work-up for the possible esophageal mass and epigastric pain.  In the ER patient was found to have urinary retention and was placed on Foley catheter.  Hospital course complicated by ongoing abdominal pain of unclear etiology.  [facility] seeing, initially did not think this was primary hepatobiliary or GI related and suspected due to right heart failure and cor pulmonale.  Cardiology was consulted, status post right heart cath and do not feel that her abdominal symptoms are related to cardiac issues.  Pulmonology consulted for moderate pulmonary artery hypertension.  Surgical team (due to have parathyroid surgery 10/1) alerted of patient's admission on 9/19.  Course complicated by CT evidence of SBO 9/20.  NG tube placed.  Assessment & [name]:   Principal Problem:   Acute respiratory failure with hypoxia ([name]) Active Problems:   Pulmonary emphysema ([name])   Essential hypertension   Hyperparathyroidism, primary ([place])   History of colon cancer   Epigastric pain   Protein-calorie malnutrition, severe   Abnormal CT scan, gastrointestinal tract   Constipation   Other ascites   Pulmonary hypertension  ([name])   Acute Hypoxic respiratory Failure;  Unclear etiology.  Former smoker.  TTE 9/17 showed normal LVEF, grade 1 diastolic dysfunction and pulmonary hypertension with PA peak pressure 69 mmHg.  VQ scan showed no perfusion defect.  Low index of concern for pneumonia, Unasyn discontinued.  No clinical bronchospasm/COPD exacerbation.  Treated with IV Lasix and clinically appears compensated.   Pulmonology follow-up appreciated.  Recommend that since concern with cor pulmonale, would keep on oxygen for 3 months with goal of >90% at all times, follow-up in office, no role for vasodilator therapy but can be followed up during outpatient pulmonology visit with Dr. Lake Bells.  Remains on oxygen 3 L/min and saturating in the mid to high 90s.  Gold stage II COPD Pulmonology input appreciated.  Pulmonology and cardiology indicate that her degree of cor pulmonale does not explain her abdominal pain. Pulmonology also recommend not to do parathyroid surgery until her abdominal and back pain symptoms have been evaluated and resolved. They recommend putting back on Advair 250 mg, 1 puff twice daily and as needed [place] at discharge.  Stable.  Moderate pulmonary artery hypertension Cardiology was consulted and patient underwent [place] 9/19 with findings as below (moderate by right heart cath).  VQ scan negative.  Pulmonology was consulted and input appreciated.  Needs spirometry and PFTs.  Abdominal pain/abnormal CT abdomen findings/new SBO;  Unclear etiology.   GI following and their input appreciated.  Abnormal esophagus noted on CT.  Esophagogram 9/18 showed esophageal dysmotility, distal esophagus appears slightly patulous and there may be an associated diverticulum.  EGD 06/2017 had shown normal esophagus and fundal gastritis.  Now on  PPI.  As per GI, abnormal CT abdomen findings (mesenteric edema, pericholecystic edema, mild intra-/extrahepatic ductal dilatation, focal PD narrowing per CT, MRCP) may be due to  right heart failure and do not represent cholecystitis.  Other findings felt to be artifact per Dr. Celesta Aver discussion with radiology.  GI recommended cardiology input and as noted above.   Patient failed 2 to 3 days of conservative treatment with enemas and bowel regimen.  Eventually KUB followed by CT abdomen 9/20 suggested SBO and ileus less likely.  As discussed with GI, treating conservatively with bowel rest, NPO, NG tube, serial x-rays and hold off surgical consultation for now unless she does not improve or worsens.  Since n.p.o. and clinically appears volume depleted, gentle IV saline hydration.  Constipation: Complicated by hypercalcemia.  No significant relief with enemas x2 days.  Please see discussion above and management per GI.  Esophageal dysmotility: Evaluation and management as per GI.  Please see above.  On PPI.  CT abdomen had questioned esophageal mass.  I discussed with Dr. Carlean Purl, GI on 9/19 and she does not have esophageal mass.  Urinary retention; foley catheter.  Voiding without difficulty.  Bladder scan 9/19 did not show urinary retention.  Primary hyperparathyroidism, hypercalcemia;  Parathyroidectomy set for 10/02/17 with Dr Rosendo Gros.   No IV fluids to avoid pulmonary edema.  Started calcitonin.  Calcium is back up and no significant improvement.   She has mild hypercalcemia and I am not sure if her degree of hypercalcemia is the cause for her abdominal pain. Calcium 12 today.  Gentle IV normal saline hydration.  Continue to follow daily BMP.  Hypokalemia; replace to >4 and check and replace magnesium to > 2.  NSVT: TTE shows preserved LVEF.  May be due to electrolyte abnormalities.  Replace as needed and follow BMP.  Continue telemetry monitoring.  Change beta-blockers to IV while NPO.  Elevated anion gap;  Resolved.  Essential hypertension:  IV BB while NPO, then back to PO Toprol.  PRN IV hydralazine.   DVT prophylaxis: SCD Code Status: Full code.   Family Communication: None at bedside. Disposition [name]: DC home pending clinical improvement.   Consultants:   GI  Cardiology  Pulmonology   Procedures:  ECHO; - Left ventricle: The cavity size was normal. There was moderate   septal hypertrophy with otherwise mild concentric hypertrophy.   Systolic function was normal. The estimated ejection fraction was   in the range of 60% to 65%. There was dynamic obstruction at   rest, with a peak velocity of 189 cm/sec and a peak gradient of   14 mm Hg. Wall motion was normal; there were no regional wall   motion abnormalities. Doppler parameters are consistent with   abnormal left ventricular relaxation (grade 1 diastolic   dysfunction). - Aortic valve: Transvalvular velocity was within the normal range.   There was no stenosis. There was mild regurgitation. - Mitral valve: Transvalvular velocity was within the normal range.   There was no evidence for stenosis. There was trivial   regurgitation. - Right ventricle: The cavity size was normal. Wall thickness was   normal. Systolic function was normal. - Atrial septum: No defect or patent foramen ovale was identified   by color flow Doppler. - Tricuspid valve: There was mild regurgitation. - Pulmonary arteries: Systolic pressure was severely increased. PA    peak pressure: 69 mm Hg (S).   Right heart cath 09/20/2017  1. Normal right and left heart filling pressures.  2.  Moderate pulmonary arterial hypertension with very high PVR.  3. Low cardiac output.    Significant PAH. This may be group 3 PH in setting of COPD given history.  V/Q scan was not suggestive of chronic PEs.  She needs PFTs to see how severe COPD is.  If pulmonary hypertension looks out of proportion to severity of COPD, would consider treatment.    I am not sure that cardiac output as obtained is correct.  Oxygen saturation was up and down in the cath lab, we used a pulse ox saturation.  It is possible  saturation was truly lower and CO higher.  NGT 9/21   Antimicrobials:  Unasyn 9-17-discontinued   Subjective: Overnight events noted.  Ongoing abdominal pain.  Reports she has not had a proper BM in 3 weeks and the 1 or 2 she has had were small hard stools.  Declined NG tube overnight but willing to have it done today.  Objective: Vitals:   09/22/17 0748 09/22/17 0749 09/22/17 1251 09/22/17 1254  BP:   (!) 162/95   Pulse:   70   Resp:   15   Temp:    99.1 F (37.3 C)  TempSrc:    Oral  SpO2: 96% 96% 100%   Weight:      Height:        Intake/Output Summary (Last 24 hours) at 09/22/2017 1304 Last data filed at 09/22/2017 0650 Gross per 24 hour  Intake 180 ml  Output 450 ml  Net -270 ml   Filed Weights   09/20/17 0435 09/21/17 0447 09/22/17 0647  Weight: 54.7 kg 55 kg 54.9 kg    Examination:  General exam: Pleasant elderly female, moderately built, frail and thinly nourished lying comfortably supine in bed.  Oral mucosa dry. Respiratory system: Clear to auscultation. Respiratory effort normal.  Stable. Cardiovascular system: S1 & S2 heard, RRR. No JVD, murmurs, rubs, gallops or clicks. No pedal edema.  Telemetry personally reviewed: Sinus rhythm, occasional PVCs.  NSVT at 5:36 PM on 9/20. Gastrointestinal system: Abdomen is nondistended, soft, mild periumbilical tenderness without rigidity, guarding or rebound.  No organomegaly or masses appreciated.  Normal bowel sounds heard.  No change. Central nervous system: Alert and oriented. No focal neurological deficits.  Stable. Extremities: Symmetric 5 x 5 power. Skin: No rashes, lesions or ulcers Psychiatry: Judgement and insight appear normal. Mood & affect appropriate.     Data Reviewed: I have personally reviewed following labs and imaging studies  CBC: Recent Labs  Lab 09/16/17 2005 09/17/17 0350 09/18/17 0511 09/19/17 0627 09/20/17 0509  WBC 8.7 7.0 8.4 7.9 6.8  NEUTROABS 7.5 6.3  --   --   --   HGB 14.1  14.5 14.7 16.1* 14.5  HCT 44.0 44.1 45.5 51.0* 45.4  MCV 87.6 85.3 87.2 88.4 87.3  PLT 212 227 260 270 116   Basic Metabolic Panel: Recent Labs  Lab 09/18/17 0511 09/19/17 0627 09/20/17 0509 09/21/17 0633 09/22/17 0540  NA 142 141 141 142 140  K 3.4* 3.8 3.7 3.9 3.3*  CL 102 102 102 99 94*  CO2 _0 GLUCOSE 105* 116* 125* 146* 142*  BUN 21 23 25* 20 25*  CREATININE 1.05* 1.07* 0.87 0.76 0.95  CALCIUM 11.0* 11.4* 10.9* 11.3* 12.0*   GFR: Estimated Creatinine Clearance: 46.4 mL/min (by C-G formula based on SCr of 0.95 mg/dL). Liver Function Tests: Recent Labs  Lab 09/16/17 2005 09/17/17 0350 09/18/17 0511 09/19/17 0627  AST 23 25  22 19  ALT _0 ALKPHOS 111 116 89 92  BILITOT 0.7 1.0 1.2 1.0  PROT 6.9 7.1 6.1* 6.3*  ALBUMIN 4.2 4.2 3.5 3.6   Recent Labs  Lab 09/16/17 2005  LIPASE 23   Cardiac Enzymes: Recent Labs  Lab 09/17/17 0350 09/17/17 0844 09/17/17 1439  TROPONINI <0.03 <0.03 <0.03   CBG: Recent Labs  Lab 09/21/17 2127 09/22/17 0151 09/22/17 0426 09/22/17 0908 09/22/17 1249  GLUCAP 151* 135* 166* 153* 129*   Sepsis Labs: Recent Labs  Lab 09/17/17 0350 09/18/17 1152 09/18/17 1401  PROCALCITON <0.10  --   --   LATICACIDVEN  --  1.0 0.9    No results found for this or any previous visit (from the past 240 hour(s)).       Radiology Studies: Ct Abdomen Pelvis W Contrast  Result Date: 09/21/2017 CLINICAL DATA:  Lower abdominal pain with nausea. EXAM: CT ABDOMEN AND PELVIS WITH CONTRAST TECHNIQUE: Multidetector CT imaging of the abdomen and pelvis was performed using the standard protocol following bolus administration of intravenous contrast. CONTRAST:  163m OMNIPAQUE IOHEXOL 300 MG/ML  SOLN COMPARISON:  None. FINDINGS: Lower chest: Distal esophagus is patulous and filled with contrast material. Hepatobiliary: No focal abnormality within the liver parenchyma. Gallbladder decompressed. No intrahepatic or extrahepatic  biliary dilation. Pancreas: No focal mass lesion. No dilatation of the main duct. No intraparenchymal cyst. No peripancreatic edema. Spleen: No splenomegaly. No focal mass lesion. Adrenals/Urinary Tract: No adrenal nodule or mass. stable appearance 13 mm cyst upper pole right kidney. Other bilateral renal cysts are evident measuring up to 5.5 cm in the left kidney. No evidence for hydroureter. Bladder is distended. Stomach/Bowel: Assessment of bowel, specially in the pelvis, is limited by the presence of barium in the small bowel loops from barium esophagram 2 days ago. Stomach is markedly distended. Duodenum is distended. Small bowel loops are diffusely dilated measuring up to 3.7 cm in diameter proximally terminal ileum is not well seen. The appendix is normal. The colon is unremarkable. Assessment of the pelvis is degraded by streak artifact from dense contrast material in posterior pelvic small bowel loops. Vascular/Lymphatic: There is abdominal aortic atherosclerosis without aneurysm. There is no gastrohepatic or hepatoduodenal ligament lymphadenopathy. No intraperitoneal or retroperitoneal lymphadenopathy. Reproductive: Unremarkable. Other: No substantial free fluid. Musculoskeletal: No worrisome lytic or sclerotic osseous abnormality. IMPRESSION: 1. Marked distention of the stomach and small bowel. There is contrast material in a patulous distal esophagus likely reflecting reflux. A discrete small-bowel transition zone cannot be identified and assessment of bowel loops is limited by the barium scattered throughout the small bowel from a barium study 2 days ago. None of the barium from that exam has reached the colon and no enteric contrast from today's study has reached the colon. There is a gradual tapering of dilated jejunal loops down to more modestly dilated ileal loops. Imaging features concerning for small bowel obstruction. Severe ileus considered less likely but possible. 2. Bilateral renal cysts. 3.   Aortic Atherosclerois (ICD10-170.0) Electronically Signed   By: EMisty StanleyM.D.   On: 09/21/2017 20:36   Dg Abd Portable 1v  Result Date: 09/22/2017 CLINICAL DATA:  Small bowel obstruction. EXAM: PORTABLE ABDOMEN - 1 VIEW COMPARISON:  Radiographs and CT 09/21/2017.  CT and MRCP 09/17/2017. FINDINGS: A large amount of barium remains within the stomach and small bowel. The small bowel remains diffusely dilated. No enteric contrast is seen within the colon which remains decompressed. There is no extraluminal  fluid collection. Of note, this bowel distension has developed since the CT 5 days ago. IMPRESSION: Findings remain consistent with a distal small bowel obstruction which has developed over the last 5 days. No contrast enters the colon. Electronically Signed   By: Richardean Sale M.D.   On: 09/22/2017 09:57   Dg Abd Portable 1v  Result Date: 09/21/2017 CLINICAL DATA:  Follow-up constipation EXAM: PORTABLE ABDOMEN - 1 VIEW COMPARISON:  09/17/2017 FINDINGS: Previously administered contrast material is again noted within the stomach but also within the small bowel. Mildly dilated small bowel is noted. No free air is seen. No bony abnormality is noted. IMPRESSION: Contrast material scattered throughout the small bowel from recent esophagram. This suggests significantly delayed small bowel transit time. Partial small bowel obstruction could not be excluded Electronically Signed   By: Inez Catalina M.D.   On: 09/21/2017 09:08        Scheduled Meds: . albuterol  2.5 mg Nebulization BID  . budesonide (PULMICORT) nebulizer solution  0.5 mg Nebulization BID  . calcitonin (salmon)  1 spray Alternating Nares Daily  . chlorhexidine  15 mL Mouth Rinse BID  . feeding supplement (GLUCERNA SHAKE)  237 mL Oral TID BM  . feeding supplement (PRO-STAT SUGAR FREE 64)  30 mL Oral BID  . Influenza vac split quadrivalent PF  0.5 mL Intramuscular Tomorrow-1000  . ketorolac  15 mg Intravenous Q6H  . mouth rinse  15  mL Mouth Rinse q12n4p  . metoprolol tartrate  25 mg Oral BID  . mirtazapine  15 mg Oral QHS  . multivitamin with minerals  1 tablet Oral Daily  . senna  2 tablet Oral QHS   Continuous Infusions: . famotidine (PEPCID) IV 40 mg (09/22/17 0911)     LOS: 5 days    Time spent: 35 minutes    Vernell Leep, MD, FACP, [facility]. [facility] Pager [phone]  If 7PM-7AM, please contact night-coverage [email or web address] Password [email or web address] 09/22/2017, 1:04 PM

## 2017-09-22 NOTE — Progress Notes (Addendum)
   Patient Name: Denise [name]. Marvel Plan Date of Encounter: 09/22/2017, 10:55 AM    Subjective  Same - abd pain lower, no BM, distended CT showed SBO, ileus less likely   Objective  BP (!) 155/86 (BP Location: Right Arm)   Pulse 60   Temp 98 F (36.7 C) (Oral)   Resp 16   Ht _0  (1.676 m)   Wt 54.9 kg   SpO2 96%   BMI 19.55 kg/m  NAD but does not feel well Eyes anicteric Abd protuberant, + BS increased and mild-mod tender lower abdoemn She is a and o x 3 and appropriate  CT as above  Ascites is gone (not mentioned)  Labs in EMR reviewed   Assessment and Plan  Constipation - unchanged Lower abdominal pain - unchanged Dilated small bowel - CT suggests SBO as opposed to ileus COPD PAH Hyperparathyroid and hypercalcemic   NG tube to LIWS DG abd again in AM  Gatha Mayer, MD, [facility] 09/22/2017 10:55 AM Pager [phone]

## 2017-09-22 NOTE — Plan of Care (Signed)
  Problem: Education: Goal: Knowledge of General Education information will improve Description Including pain rating scale, medication(s)/side effects and non-pharmacologic comfort measures Outcome: Progressing Note:  POC and pain management reviewed with pt.

## 2017-09-22 NOTE — Progress Notes (Signed)
Hold off on NG tube for now. Pt moving her bowels advance to clear liquids.

## 2017-09-23 ENCOUNTER — Inpatient Hospital Stay (HOSPITAL_COMMUNITY): Payer: Medicare HMO

## 2017-09-23 DIAGNOSIS — K56609 Unspecified intestinal obstruction, unspecified as to partial versus complete obstruction: Secondary | ICD-10-CM

## 2017-09-23 LAB — BASIC METABOLIC PANEL
Anion gap: 8 (ref 5–15)
BUN: 31 mg/dL — AB (ref 8–23)
CHLORIDE: 103 mmol/L (ref 98–111)
CO2: 28 mmol/L (ref 22–32)
Calcium: 10.9 mg/dL — ABNORMAL HIGH (ref 8.9–10.3)
Creatinine, Ser: 0.99 mg/dL (ref 0.44–1.00)
GFR calc non Af Amer: 56 mL/min — ABNORMAL LOW (ref >60)
Glucose, Bld: 123 mg/dL — ABNORMAL HIGH (ref 70–99)
POTASSIUM: 4.8 mmol/L (ref 3.5–5.1)
SODIUM: 139 mmol/L (ref 135–145)

## 2017-09-23 LAB — GLUCOSE, CAPILLARY
GLUCOSE-CAPILLARY: 125 mg/dL — AB (ref 70–99)
GLUCOSE-CAPILLARY: 113 mg/dL — AB (ref 70–99)
GLUCOSE-CAPILLARY: 134 mg/dL — AB (ref 70–99)
Glucose-Capillary: 101 mg/dL — ABNORMAL HIGH (ref 70–99)
Glucose-Capillary: 110 mg/dL — ABNORMAL HIGH (ref 70–99)
Glucose-Capillary: 112 mg/dL — ABNORMAL HIGH (ref 70–99)
Glucose-Capillary: 120 mg/dL — ABNORMAL HIGH (ref 70–99)

## 2017-09-23 MED ORDER — METOPROLOL TARTRATE 25 MG PO TABS
25.0000 mg | ORAL_TABLET | Freq: Two times a day (BID) | ORAL | Status: DC
  Administered 2017-09-23 – 2017-09-24 (×2): 25 mg via ORAL
  Filled 2017-09-23 (×2): qty 1

## 2017-09-23 NOTE — Progress Notes (Signed)
PROGRESS NOTE    Denise [name] Plan  IAX:[id number] DOB: 16-Dec-1945 DOA: 09/16/2017 PCP: [name], NP    Brief Narrative: [name]. Denise [name] is a 72 y.o. female with history of COPD, hypertension, colon cancer status post resection, hyperparathyroidism scheduled for surgery next month presented to the ER with complaints of increasing exertional shortness of breath over the last 1 week with epigastric pain.   ED Course: In the ER chest x-ray shows pleural effusion and congestion and possible consolidation per the CAT scan.  CT abdomen pelvis done shows possible mass in the distal esophagus and also intra-and extrahepatic ductal dilation.  LFTs are normal.  Patient in the ER was given nebulizer treatment steroids Lasix for COPD versus CHF treatment.    Admitted for further work-up for the possible esophageal mass and epigastric pain.  In the ER patient was found to have urinary retention and was placed on Foley catheter.  Hospital course complicated by ongoing abdominal pain of unclear etiology.  [facility] seeing, initially did not think this was primary hepatobiliary or GI related and suspected due to right heart failure and cor pulmonale.  Cardiology was consulted, status post right heart cath and do not feel that her abdominal symptoms are related to cardiac issues.  Pulmonology consulted for moderate pulmonary artery hypertension.  Surgical team (due to have parathyroid surgery 10/1) alerted of patient's admission on 9/19.  Course complicated by CT evidence of SBO 9/20.  Improved.  Did not need NG tube.  Assessment & Plan:   Principal Problem:   Acute respiratory failure with hypoxia ([name]) Active Problems:   Pulmonary emphysema ([name])   Essential hypertension   Hyperparathyroidism, primary ([place])   History of colon cancer   Epigastric pain   Protein-calorie malnutrition, severe   Abnormal CT scan, gastrointestinal tract   Constipation   Other ascites   Pulmonary  hypertension ([name])   Acute Hypoxic respiratory Failure;  Unclear etiology.  Former smoker.  TTE 9/17 showed normal LVEF, grade 1 diastolic dysfunction and pulmonary hypertension with PA peak pressure 69 mmHg.  VQ scan showed no perfusion defect.  Low index of concern for pneumonia, Unasyn discontinued.  No clinical bronchospasm/COPD exacerbation.  Treated with IV Lasix and clinically appears compensated.   Pulmonology follow-up appreciated.  Recommend that since concern with cor pulmonale, would keep on oxygen for 3 months with goal of >90% at all times, follow-up in office, no role for vasodilator therapy but can be followed up during outpatient pulmonology visit with Dr. Lake Bells.  Remains on oxygen 3 L/min and saturating in the mid to high 90s.  Discussed with RN and advised wean oxygen as tolerated.  Gold stage II COPD Pulmonology input appreciated.  Pulmonology and cardiology indicate that her degree of cor pulmonale does not explain her abdominal pain. Pulmonology also recommend not to do parathyroid surgery until her abdominal and back pain symptoms have been evaluated and resolved. They recommend putting back on Advair 250 mg, 1 puff twice daily and as needed [place] at discharge.  Stable.  Moderate pulmonary artery hypertension Cardiology was consulted and patient underwent M[place] 9/19 with findings as below (moderate by right heart cath).  VQ scan negative.  Pulmonology was consulted and input appreciated.  Needs spirometry and PFTs.  Abdominal pain/abnormal CT abdomen findings/new PSBO;  Unclear etiology.  [facility] following and their input appreciated.  Abnormal esophagus noted on CT.  Esophagogram 9/18 showed esophageal dysmotility, distal esophagus appears slightly patulous and there may be an associated  diverticulum.  EGD 06/2017 had shown normal esophagus and fundal gastritis.  Now on PPI.  As per GI, abnormal CT abdomen findings (mesenteric edema, pericholecystic edema, mild  intra-/extrahepatic ductal dilatation, focal PD narrowing per CT, MRCP) may be due to right heart failure and do not represent cholecystitis.  Other findings felt to be artifact per Dr. Celesta Aver discussion with radiology.  GI recommended cardiology input and as noted above.   Patient failed 2 to 3 days of conservative treatment with enemas and bowel regimen.  Eventually KUB followed by CT abdomen 9/20 suggested SBO and ileus less likely.  She was treated conservatively with bowel rest, before NG tube could be placed she started having multiple BMs with clinical improvement, clear liquids were started which she has tolerated, abdominal pain resolved.  KUB shows significant improvement.  Advance diet as tolerated.  Constipation: Complicated by hypercalcemia.  As above.  Esophageal dysmotility: Evaluation and management as per GI.  Please see above.  On PPI.  CT abdomen had questioned esophageal mass.  I discussed with Dr. Carlean Purl, GI on 9/19 and she does not have esophageal mass.  Urinary retention; foley catheter.  Resolved.  Primary hyperparathyroidism, hypercalcemia;  Parathyroidectomy set for 10/02/17 with Dr Rosendo Gros.   No IV fluids to avoid pulmonary edema.  Started calcitonin.  Calcium is back up and no significant improvement.   She has mild hypercalcemia and I am not sure if her degree of hypercalcemia is the cause for her abdominal pain. Calcium 12 on 9/21.  Gentle IV normal saline hydration.  Calcium has improved to 10.9.  Continue gentle IV fluids for another day.  Hypokalemia; replaced.  NSVT: TTE shows preserved LVEF.  May be due to electrolyte abnormalities.  Replace as needed and follow BMP.  Continue telemetry monitoring.  No further episodes in the last 24 hours.  Change beta-blockers back to oral.  Elevated anion gap;  Resolved.  Essential hypertension:  Resume oral Toprol.  PRN IV hydralazine.   DVT prophylaxis: SCD Code Status: Full code.  Family Communication: None  at bedside. Disposition Plan: DC home pending clinical improvement fully in the next 24-48 hours.   Consultants:   GI  Cardiology  Pulmonology   Procedures:  ECHO; - Left ventricle: The cavity size was normal. There was moderate   septal hypertrophy with otherwise mild concentric hypertrophy.   Systolic function was normal. The estimated ejection fraction was   in the range of 60% to 65%. There was dynamic obstruction at   rest, with a peak velocity of 189 cm/sec and a peak gradient of   14 mm Hg. Wall motion was normal; there were no regional wall   motion abnormalities. Doppler parameters are consistent with   abnormal left ventricular relaxation (grade 1 diastolic   dysfunction). - Aortic valve: Transvalvular velocity was within the normal range.   There was no stenosis. There was mild regurgitation. - Mitral valve: Transvalvular velocity was within the normal range.   There was no evidence for stenosis. There was trivial   regurgitation. - Right ventricle: The cavity size was normal. Wall thickness was   normal. Systolic function was normal. - Atrial septum: No defect or patent foramen ovale was identified   by color flow Doppler. - Tricuspid valve: There was mild regurgitation. - Pulmonary arteries: Systolic pressure was severely increased. PA    peak pressure: 69 mm Hg (S).   Right heart cath 09/20/2017  1. Normal right and left heart filling pressures.  2. Moderate pulmonary  arterial hypertension with very high PVR.  3. Low cardiac output.    Significant PAH. This may be group 3 PH in setting of COPD given history.  V/Q scan was not suggestive of chronic PEs.  She needs PFTs to see how severe COPD is.  If pulmonary hypertension looks out of proportion to severity of COPD, would consider treatment.    I am not sure that cardiac output as obtained is correct.  Oxygen saturation was up and down in the cath lab, we used a pulse ox saturation.  It is possible  saturation was truly lower and CO higher.     Antimicrobials:  Unasyn 9-17-discontinued   Subjective: Feels much better.  Did not need NG tube.  Started having multiple BMs yesterday and last night.  Tolerated clear liquids.  Abdominal pain resolved sometime last night.  Objective: Vitals:   09/22/17 2020 09/23/17 0650 09/23/17 0655 09/23/17 1250  BP: 122/76 119/74  140/81  Pulse: 71 79  70  Resp: _0 Temp: (!) 97.3 F (36.3 C) 99.4 F (37.4 C)  98 F (36.7 C)  TempSrc: Oral Oral  Oral  SpO2: 100% 90% 99% 98%  Weight:  54.7 kg    Height:        Intake/Output Summary (Last 24 hours) at 09/23/2017 1543 Last data filed at 09/23/2017 1517 Gross per 24 hour  Intake 1852.84 ml  Output 2 ml  Net 1850.84 ml   Filed Weights   09/21/17 0447 09/22/17 0647 09/23/17 0650  Weight: 55 kg 54.9 kg 54.7 kg    Examination:  General exam: Pleasant elderly female, moderately built, frail and thinly nourished lying comfortably supine in bed.  Oral mucosa moist Respiratory system: Clear to auscultation.  No increased work of breathing. Cardiovascular system: S1 & S2 heard, RRR. No JVD, murmurs, rubs, gallops or clicks. No pedal edema.  Telemetry personally reviewed: Sinus rhythm.  Occasional PVCs.  No NSVT. Gastrointestinal system: Abdomen is nondistended, soft, and nontender.  No organomegaly or masses appreciated.  Normal bowel sounds heard.   Central nervous system: Alert and oriented. No focal neurological deficits.  Stable Extremities: Symmetric 5 x 5 power. Skin: No rashes, lesions or ulcers Psychiatry: Judgement and insight appear normal. Mood & affect appropriate.     Data Reviewed: I have personally reviewed following labs and imaging studies  CBC: Recent Labs  Lab 09/16/17 2005 09/17/17 0350 09/18/17 0511 09/19/17 0627 09/20/17 0509  WBC 8.7 7.0 8.4 7.9 6.8  NEUTROABS 7.5 6.3  --   --   --   HGB 14.1 14.5 14.7 16.1* 14.5  HCT 44.0 44.1 45.5 51.0* 45.4  MCV  87.6 85.3 87.2 88.4 87.3  PLT 212 227 260 270 109   Basic Metabolic Panel: Recent Labs  Lab 09/19/17 0627 09/20/17 0509 09/21/17 0633 09/22/17 0540 09/23/17 0523  NA 141 141 142 140 139  K 3.8 3.7 3.9 3.3* 4.8  CL 102 102 99 94* 103  CO2 _1 GLUCOSE 116* 125* 146* 142* 123*  BUN 23 25* 20 25* 31*  CREATININE 1.07* 0.87 0.76 0.95 0.99  CALCIUM 11.4* 10.9* 11.3* 12.0* 10.9*  MG  --   --   --  2.1  --    GFR: Estimated Creatinine Clearance: 44.4 mL/min (by C-G formula based on SCr of 0.99 mg/dL). Liver Function Tests: Recent Labs  Lab 09/16/17 2005 09/17/17 0350 09/18/17 0511 09/19/17 0627  AST _2 ALT  _0 ALKPHOS 111 116 89 92  BILITOT 0.7 1.0 1.2 1.0  PROT 6.9 7.1 6.1* 6.3*  ALBUMIN 4.2 4.2 3.5 3.6   Recent Labs  Lab 09/16/17 2005  LIPASE 23   Cardiac Enzymes: Recent Labs  Lab 09/17/17 0350 09/17/17 0844 09/17/17 1439  TROPONINI <0.03 <0.03 <0.03   CBG: Recent Labs  Lab 09/22/17 2017 09/23/17 0019 09/23/17 0441 09/23/17 0818 09/23/17 1247  GLUCAP 141* 112* 125* 113* 134*   Sepsis Labs: Recent Labs  Lab 09/17/17 0350 09/18/17 1152 09/18/17 1401  PROCALCITON <0.10  --   --   LATICACIDVEN  --  1.0 0.9    No results found for this or any previous visit (from the past 240 hour(s)).       Radiology Studies: Ct Abdomen Pelvis W Contrast  Result Date: 09/21/2017 CLINICAL DATA:  Lower abdominal pain with nausea. EXAM: CT ABDOMEN AND PELVIS WITH CONTRAST TECHNIQUE: Multidetector CT imaging of the abdomen and pelvis was performed using the standard protocol following bolus administration of intravenous contrast. CONTRAST:  148m OMNIPAQUE IOHEXOL 300 MG/ML  SOLN COMPARISON:  None. FINDINGS: Lower chest: Distal esophagus is patulous and filled with contrast material. Hepatobiliary: No focal abnormality within the liver parenchyma. Gallbladder decompressed. No intrahepatic or extrahepatic biliary dilation. Pancreas: No  focal mass lesion. No dilatation of the main duct. No intraparenchymal cyst. No peripancreatic edema. Spleen: No splenomegaly. No focal mass lesion. Adrenals/Urinary Tract: No adrenal nodule or mass. stable appearance 13 mm cyst upper pole right kidney. Other bilateral renal cysts are evident measuring up to 5.5 cm in the left kidney. No evidence for hydroureter. Bladder is distended. Stomach/Bowel: Assessment of bowel, specially in the pelvis, is limited by the presence of barium in the small bowel loops from barium esophagram 2 days ago. Stomach is markedly distended. Duodenum is distended. Small bowel loops are diffusely dilated measuring up to 3.7 cm in diameter proximally terminal ileum is not well seen. The appendix is normal. The colon is unremarkable. Assessment of the pelvis is degraded by streak artifact from dense contrast material in posterior pelvic small bowel loops. Vascular/Lymphatic: There is abdominal aortic atherosclerosis without aneurysm. There is no gastrohepatic or hepatoduodenal ligament lymphadenopathy. No intraperitoneal or retroperitoneal lymphadenopathy. Reproductive: Unremarkable. Other: No substantial free fluid. Musculoskeletal: No worrisome lytic or sclerotic osseous abnormality. IMPRESSION: 1. Marked distention of the stomach and small bowel. There is contrast material in a patulous distal esophagus likely reflecting reflux. A discrete small-bowel transition zone cannot be identified and assessment of bowel loops is limited by the barium scattered throughout the small bowel from a barium study 2 days ago. None of the barium from that exam has reached the colon and no enteric contrast from today's study has reached the colon. There is a gradual tapering of dilated jejunal loops down to more modestly dilated ileal loops. Imaging features concerning for small bowel obstruction. Severe ileus considered less likely but possible. 2. Bilateral renal cysts. 3.  Aortic Atherosclerois  (ICD10-170.0) Electronically Signed   By: EMisty StanleyM.D.   On: 09/21/2017 20:36   Dg Abd Portable 1v  Result Date: 09/23/2017 CLINICAL DATA:  Small-bowel obstruction protocol. EXAM: PORTABLE ABDOMEN - 1 VIEW COMPARISON:  09/22/2017.  CT, 09/21/2017. FINDINGS: Contrast has progressed into the colon. Colon is normal in caliber. Previously seen small bowel dilation has mostly resolved. IMPRESSION: 1. Significant interval improvement in the presumed partial small bowel obstruction. Contrast has now extended into a normal caliber colon. Electronically Signed  By: Lajean Manes M.D.   On: 09/23/2017 08:20   Dg Abd Portable 1v  Result Date: 09/22/2017 CLINICAL DATA:  Small bowel obstruction. EXAM: PORTABLE ABDOMEN - 1 VIEW COMPARISON:  Radiographs and CT 09/21/2017.  CT and MRCP 09/17/2017. FINDINGS: A large amount of barium remains within the stomach and small bowel. The small bowel remains diffusely dilated. No enteric contrast is seen within the colon which remains decompressed. There is no extraluminal fluid collection. Of note, this bowel distension has developed since the CT 5 days ago. IMPRESSION: Findings remain consistent with a distal small bowel obstruction which has developed over the last 5 days. No contrast enters the colon. Electronically Signed   By: Richardean Sale M.D.   On: 09/22/2017 09:57        Scheduled Meds: . albuterol  2.5 mg Nebulization BID  . budesonide (PULMICORT) nebulizer solution  0.5 mg Nebulization BID  . calcitonin (salmon)  1 spray Alternating Nares Daily  . chlorhexidine  15 mL Mouth Rinse BID  . feeding supplement (GLUCERNA SHAKE)  237 mL Oral TID BM  . feeding supplement (PRO-STAT SUGAR FREE 64)  30 mL Oral BID  . Influenza vac split quadrivalent PF  0.5 mL Intramuscular Tomorrow-1000  . ketorolac  15 mg Intravenous Q6H  . mouth rinse  15 mL Mouth Rinse q12n4p  . metoprolol tartrate  5 mg Intravenous Q8H  . mirtazapine  15 mg Oral QHS  . multivitamin  with minerals  1 tablet Oral Daily  . senna  2 tablet Oral QHS   Continuous Infusions: . sodium chloride 50 mL/hr at 09/22/17 1432  . famotidine (PEPCID) IV 40 mg (09/23/17 1000)     LOS: 6 days    Time spent: 35 minutes    Vernell Leep, MD, FACP, [facility]. [facility] Pager [phone]  If 7PM-7AM, please contact night-coverage [email or web address] Password [facility] 09/23/2017, 3:43 PM

## 2017-09-23 NOTE — Progress Notes (Signed)
   Patient Name: Denise [name]. Marvel Plan Date of Encounter: 09/23/2017, 5:14 PM    Subjective  Abdominal pain and back pain basically gone after she finally started to move her bowels and open up.  This was right before we were going to put an NG tube and after her CT scan.  Abdominal film shows improvement in SBO.  Breathing is stable and improved   Objective  BP 140/81   Pulse 70   Temp 98 F (36.7 C) (Oral)   Resp 15   Ht _0  (1.676 m)   Wt 54.7 kg   SpO2 98%   BMI 19.48 kg/m  Eyes are anicteric Heart distant S1-S2 Lungs diffusely decreased breath sounds Abdomen mildly protuberant definitely less distended soft and nontender  CBC Latest Ref Rng & Units 09/20/2017 09/19/2017 09/18/2017  WBC 4.0 - 10.5 K/uL 6.8 7.9 8.4  Hemoglobin 12.0 - 15.0 g/dL 14.5 16.1(H) 14.7  Hematocrit 36.0 - 46.0 % 45.4 51.0(H) 45.5  Platelets 150 - 400 K/uL 276 270 260    DG Abd Portable 1V CLINICAL DATA:  Small-bowel obstruction protocol.  EXAM: PORTABLE ABDOMEN - 1 VIEW  COMPARISON:  09/22/2017.  CT, 09/21/2017.  FINDINGS: Contrast has progressed into the colon. Colon is normal in caliber. Previously seen small bowel dilation has mostly resolved.  IMPRESSION: 1. Significant interval improvement in the presumed partial small bowel obstruction. Contrast has now extended into a normal caliber colon.  Electronically Signed   By: Lajean Manes M.D.   On: 09/23/2017 08:20       Assessment and Plan  Partial small bowel obstruction improved if not resolved.  Seems to me this was the cause of her lower abdominal and back pain.  Because question adhesions, complicated by hypercalcemia and obstipation constipation.  COPD and cor pulmonale though does not have right heart failure causing edema etc.  Ascites resolved.  Hyperparathyroidism and hypercalcemia, planned surgery early October.  Question if her right heart, pulmonary hypertension and COPD issues will impact that and delay it.   I hope not.  Would be best if pulmonary and cardiology made recommendations now.  She should take MiraLAX on a daily basis 1 or 2 a day.  I do not think she needs GI specific follow-up right now but I am happy to see her back in the office if and when needed.  We will check her again tomorrow to make sure things are continuing to progress and likely sign off at that time unless there are other issues.    Gatha Mayer, MD, [facility] 09/23/2017 5:14 PM Pager [phone]

## 2017-09-24 DIAGNOSIS — I5033 Acute on chronic diastolic (congestive) heart failure: Secondary | ICD-10-CM

## 2017-09-24 DIAGNOSIS — J449 Chronic obstructive pulmonary disease, unspecified: Secondary | ICD-10-CM

## 2017-09-24 LAB — BASIC METABOLIC PANEL
Anion gap: 10 (ref 5–15)
BUN: 23 mg/dL (ref 8–23)
CO2: 23 mmol/L (ref 22–32)
Calcium: 10.4 mg/dL — ABNORMAL HIGH (ref 8.9–10.3)
Chloride: 104 mmol/L (ref 98–111)
Creatinine, Ser: 0.84 mg/dL (ref 0.44–1.00)
GFR calc Af Amer: >60 mL/min (ref >60)
Glucose, Bld: 130 mg/dL — ABNORMAL HIGH (ref 70–99)
POTASSIUM: 4.5 mmol/L (ref 3.5–5.1)
Sodium: 137 mmol/L (ref 135–145)

## 2017-09-24 LAB — GLUCOSE, CAPILLARY
GLUCOSE-CAPILLARY: 126 mg/dL — AB (ref 70–99)
GLUCOSE-CAPILLARY: 127 mg/dL — AB (ref 70–99)
Glucose-Capillary: 110 mg/dL — ABNORMAL HIGH (ref 70–99)
Glucose-Capillary: 128 mg/dL — ABNORMAL HIGH (ref 70–99)
Glucose-Capillary: 145 mg/dL — ABNORMAL HIGH (ref 70–99)

## 2017-09-24 MED ORDER — ADULT MULTIVITAMIN W/MINERALS CH: 1.0000 | ORAL_TABLET | Freq: Every day | ORAL | Status: DC

## 2017-09-24 MED ORDER — MIRTAZAPINE 15 MG PO TABS: 15.0000 mg | ORAL_TABLET | Freq: Every day | ORAL | Status: DC

## 2017-09-24 MED ORDER — FLUTICASONE-SALMETEROL 250-50 MCG/DOSE IN AEPB: 1.0000 | INHALATION_SPRAY | Freq: Two times a day (BID) | RESPIRATORY_TRACT | 0 refills | Status: DC

## 2017-09-24 MED ORDER — SENNA 8.6 MG PO TABS: 2.0000 | ORAL_TABLET | Freq: Every evening | ORAL | 0 refills | Status: DC | PRN

## 2017-09-24 MED ORDER — FAMOTIDINE 20 MG PO TABS
40.0000 mg | ORAL_TABLET | Freq: Two times a day (BID) | ORAL | Status: DC
  Administered 2017-09-24: 40 mg via ORAL
  Filled 2017-09-24: qty 2

## 2017-09-24 MED ORDER — INFLUENZA VAC SPLIT HIGH-DOSE 0.5 ML IM SUSY
0.5000 mL | PREFILLED_SYRINGE | Freq: Once | INTRAMUSCULAR | Status: DC
  Filled 2017-09-24: qty 0.5

## 2017-09-24 MED ORDER — METOPROLOL TARTRATE 25 MG PO TABS: 25.0000 mg | ORAL_TABLET | Freq: Two times a day (BID) | ORAL | 0 refills | Status: DC

## 2017-09-24 MED ORDER — POLYETHYLENE GLYCOL 3350 17 G PO PACK: 17.0000 g | PACK | Freq: Two times a day (BID) | ORAL | 0 refills | Status: DC

## 2017-09-24 MED ORDER — FAMOTIDINE 40 MG PO TABS: 40.0000 mg | ORAL_TABLET | Freq: Two times a day (BID) | ORAL | 0 refills | Status: DC

## 2017-09-24 MED ORDER — ACETAMINOPHEN 325 MG PO TABS: 650.0000 mg | ORAL_TABLET | Freq: Four times a day (QID) | ORAL | Status: DC | PRN

## 2017-09-24 NOTE — Care Management Note (Addendum)
Case Management Note  Patient Details  Name: Denise [name] MRN: [id number] Date of Birth: March 06, 1945  Subjective/Objective:   Acute resp failure, COPD, Moderate Pulm HTN, PSBO               Action/Plan: NCM spoke to pt, husband and sister at bedside. Pt declines HH at this time. Explained PCP can arrange for [facility] from the office. Contacted [facility] for oxygen, RW with seat and 3n1 bedside commode for home. Attending notified for RW with seat and 3n1 bedside commode.   Expected Discharge Date:  09/24/17               Expected Discharge Plan:  Home/Self Care  In-House Referral:  NA  Discharge planning Services  CM Consult  Post Acute Care Choice:  NA Choice offered to:  NA  DME Arranged:  Oxygen DME Agency:  [place]:  NA [place] Agency:  NA  Status of Service:  Completed, signed off  If discussed at [place] of Stay Meetings, dates discussed:    Additional Comments:  Erenest Rasher, RN 09/24/2017, 4:17 PM

## 2017-09-24 NOTE — Pre-Procedure Instructions (Signed)
Monet A. Levings  09/24/2017      [id number] [place] [address]Phone: [phone]: [phone] [facility] [place] [address] [place]Phone: [phone]: [phone]   Your procedure is scheduled on October 02, 2017.  [facility]  Call this number if you have problems the morning of surgery:  [phone]  Remember:  Do not eat or drink after midnight.    Take these medicines the morning of surgery with A SIP OF WATER  Albuterol inhaler-if needed-bring inhaler with you Wixela inhaler-if needed-bring inhaler with you Nifedipine (Procardia XL) Sertraline (zoloft)  Follow your surgeon's instructions on when to hold/resume aspirin.  If no instructions were given call the office to determine how they would like to you take aspirin  7 days prior to surgery STOP taking any Aspirin (unless otherwise instructed by your surgeon), Aleve, Naproxen, Ibuprofen, Motrin, Advil, Goody's, BC's, all herbal medications, fish oil, and all vitamins    Do not wear jewelry, make-up or nail polish.  Do not wear lotions, powders, or perfumes, or deodorant.  Do not shave 48 hours prior to surgery.  Do not bring valuables to the hospital.  [facility] belongings or [facility].  Contacts, dentures or bridgework may not be worn into surgery.  Leave your suitcase in the car.  After surgery it may be brought to your room.  For patients admitted to the hospital, discharge time will be determined by your treatment team.  Patients discharged the day of surgery will not be allowed to drive home.    [place]- Preparing For Surgery  Before surgery, you can play an important role. Because skin is not sterile, your skin needs to be as free of germs as possible. You can reduce the  number of germs on your skin by washing with CHG (chlorahexidine gluconate) Soap before surgery.  CHG is an antiseptic cleaner which kills germs and bonds with the skin to continue killing germs even after washing.    Oral Hygiene is also important to reduce your risk of infection.  Remember - BRUSH YOUR TEETH THE MORNING OF SURGERY WITH YOUR REGULAR TOOTHPASTE  Please do not use if you have an allergy to CHG or antibacterial soaps. If your skin becomes reddened/irritated stop using the CHG.  Do not shave (including legs and underarms) for at least 48 hours prior to first CHG shower. It is OK to shave your face.  Please follow these instructions carefully.   1. Shower the NIGHT BEFORE SURGERY and the MORNING OF SURGERY with CHG.   2. If you chose to wash your hair, wash your hair first as usual with your normal shampoo.  3. After you shampoo, rinse your hair and body thoroughly to remove the shampoo.  4. Use CHG as you would any other liquid soap. You can apply CHG directly to the skin and wash gently with a scrungie or a clean washcloth.   5. Apply the CHG Soap to your body ONLY FROM THE NECK DOWN.  Do not use on open wounds or open sores. Avoid contact with your eyes, ears, mouth and genitals (private parts). Wash Face and genitals (private parts)  with your normal soap.  6. Wash thoroughly, paying special attention to the area where your surgery will be  performed.  7. Thoroughly rinse your body with warm water from the neck down.  8. DO NOT shower/wash with your normal soap after using and rinsing off the CHG Soap.  9. Pat yourself dry with a CLEAN TOWEL.  10. Wear CLEAN PAJAMAS to bed the night before surgery, wear comfortable clothes the morning of surgery  11. Place CLEAN SHEETS on your bed the night of your first shower and DO NOT SLEEP WITH PETS.  Day of Surgery:  Do not apply any deodorants/lotions.  Please wear clean clothes to the hospital/surgery center.   Remember to  brush your teeth WITH YOUR REGULAR TOOTHPASTE.   Please read over the following fact sheets that you were given. Pain Booklet, Coughing and Deep Breathing and Surgical Site Infection Prevention

## 2017-09-24 NOTE — Progress Notes (Addendum)
Progress Note  Patient Name: Denise [name]. Marvel Plan Date of Encounter: 09/24/2017  Primary Cardiologist:   Jenkins Rouge, MD  Subjective   BM this morning.  Mild abdominal pain.  No acute distress  Inpatient Medications    Scheduled Meds: . albuterol  2.5 mg Nebulization BID  . budesonide (PULMICORT) nebulizer solution  0.5 mg Nebulization BID  . calcitonin (salmon)  1 spray Alternating Nares Daily  . chlorhexidine  15 mL Mouth Rinse BID  . feeding supplement (GLUCERNA SHAKE)  237 mL Oral TID BM  . feeding supplement (PRO-STAT SUGAR FREE 64)  30 mL Oral BID  . Influenza vac split quadrivalent PF  0.5 mL Intramuscular Tomorrow-1000  . mouth rinse  15 mL Mouth Rinse q12n4p  . metoprolol tartrate  25 mg Oral BID  . mirtazapine  15 mg Oral QHS  . multivitamin with minerals  1 tablet Oral Daily  . senna  2 tablet Oral QHS   Continuous Infusions: . sodium chloride 50 mL/hr at 09/23/17 1800  . famotidine (PEPCID) IV 40 mg (09/23/17 2213)   PRN Meds: acetaminophen **OR** acetaminophen, albuterol, hydrALAZINE, magnesium hydroxide, morphine injection, ondansetron **OR** ondansetron (ZOFRAN) IV   Vital Signs    Vitals:   09/23/17 1947 09/23/17 2031 09/23/17 2354 09/24/17 0431  BP: (!) 147/72   (!) 160/100  Pulse: 79   73  Resp: 19   19  Temp:  98.5 F (36.9 C)  98.9 F (37.2 C)  TempSrc:  Oral  Oral  SpO2: 99%  95% 97%  Weight:    56.9 kg  Height:        Intake/Output Summary (Last 24 hours) at 09/24/2017 0750 Last data filed at 09/24/2017 0700 Gross per 24 hour  Intake 2396.79 ml  Output 401 ml  Net 1995.79 ml   Filed Weights   09/22/17 0647 09/23/17 0650 09/24/17 0431  Weight: 54.9 kg 54.7 kg 56.9 kg    Telemetry    NSR, PACs - Personally Reviewed  ECG    NA - Personally Reviewed  Physical Exam   GEN:  No acute distress.   Cardiac: RRR, no  murmurs, rubs, or gallops.  Respiratory:     Decreased bowel sounds MS: No edema; No deformity.   Labs      Chemistry Recent Labs  Lab 09/18/17 0511 09/19/17 0627  09/21/17 0633 09/22/17 0540 09/23/17 0523  NA 142 141   < > 142 140 139  K 3.4* 3.8   < > 3.9 3.3* 4.8  CL 102 102   < > 99 94* 103  CO2 24 28   < > _0 GLUCOSE 105* 116*   < > 146* 142* 123*  BUN 21 23   < > 20 25* 31*  CREATININE 1.05* 1.07*   < > 0.76 0.95 0.99  CALCIUM 11.0* 11.4*   < > 11.3* 12.0* 10.9*  PROT 6.1* 6.3*  --   --   --   --   ALBUMIN 3.5 3.6  --   --   --   --   AST 22 19  --   --   --   --   ALT 22 20  --   --   --   --   ALKPHOS 89 92  --   --   --   --   BILITOT 1.2 1.0  --   --   --   --   GFRNONAA 52* 51*   < > >  60 58* 56*  GFRAA 60* 59*   < > >60 >60 >60  ANIONGAP 16* 11   < > _0 < > = values in this interval not displayed.     Hematology Recent Labs  Lab 09/18/17 0511 09/19/17 0627 09/20/17 0509  WBC 8.4 7.9 6.8  RBC 5.22* 5.77* 5.20*  HGB 14.7 16.1* 14.5  HCT 45.5 51.0* 45.4  MCV 87.2 88.4 87.3  MCH 28.2 27.9 27.9  MCHC 32.3 31.6 31.9  RDW 15.1 15.4 14.8  PLT 260 270 276    Cardiac Enzymes Recent Labs  Lab 09/17/17 0844 09/17/17 1439  TROPONINI <0.03 <0.03   No results for input(s): TROPIPOC in the last 168 hours.   BNPNo results for input(s): BNP, PROBNP in the last 168 hours.   DDimer No results for input(s): DDIMER in the last 168 hours.   Radiology    Dg Abd Portable 1v  Result Date: 09/23/2017 CLINICAL DATA:  Small-bowel obstruction protocol. EXAM: PORTABLE ABDOMEN - 1 VIEW COMPARISON:  09/22/2017.  CT, 09/21/2017. FINDINGS: Contrast has progressed into the colon. Colon is normal in caliber. Previously seen small bowel dilation has mostly resolved. IMPRESSION: 1. Significant interval improvement in the presumed partial small bowel obstruction. Contrast has now extended into a normal caliber colon. Electronically Signed   By: Lajean Manes M.D.   On: 09/23/2017 08:20   Dg Abd Portable 1v  Result Date: 09/22/2017 CLINICAL DATA:  Small bowel obstruction.  EXAM: PORTABLE ABDOMEN - 1 VIEW COMPARISON:  Radiographs and CT 09/21/2017.  CT and MRCP 09/17/2017. FINDINGS: A large amount of barium remains within the stomach and small bowel. The small bowel remains diffusely dilated. No enteric contrast is seen within the colon which remains decompressed. There is no extraluminal fluid collection. Of note, this bowel distension has developed since the CT 5 days ago. IMPRESSION: Findings remain consistent with a distal small bowel obstruction which has developed over the last 5 days. No contrast enters the colon. Electronically Signed   By: Richardean Sale M.D.   On: 09/22/2017 09:57    Cardiac Studies   09/20/17  Right heart cath  1. Normal right and left heart filling pressures.  2. Moderate pulmonary arterial hypertension with very high PVR.  3. Low cardiac output.    Significant PAH. This may be group 3 PH in setting of COPD given history.  V/Q scan was not suggestive of chronic PEs.  She needs PFTs to see how severe COPD is.  If pulmonary hypertension looks out of proportion to severity of COPD, would consider treatment.     Patient Profile     72 y.o. female with a hx of COPD, hypertension, HLD, DM 2, colon cancers/presection and hyperparathyroidism scheduled for surgery next monthwho is being seen today for the evaluation ofacute CHF exacerbationat the request of Dr. Tyrell Antonio.   Assessment & Plan    PHT:  Type III secondary to underlying lung disease.  Can follow with Dr. Lake Bells.  Per pulmonary No recommendations for vasodilator therapy.  Continue care for COPD.    [facility] will sign off.   Medication Recommendations:  Plan per primary team and pulmonary.  Other recommendations (labs, testing, etc):  None Follow up as an outpatient:  As above.   Signed, Minus Breeding, MD  09/24/2017, 7:50 AM

## 2017-09-24 NOTE — Care Management Important Message (Signed)
Important Message  Patient Details  Name: Denise [name] MRN: [id number] Date of Birth: November 21, 1945   Medicare Important Message Given:  Yes    Barb Merino Oz Gammel 09/24/2017, 11:47 AM

## 2017-09-24 NOTE — Progress Notes (Signed)
SATURATION QUALIFICATIONS: (This note is used to comply with regulatory documentation for home oxygen)  Patient Saturations on Room Air at Rest =92 %  Patient Saturations on Room Air while Ambulating = 85%  Patient Saturations on 2 Liters of oxygen while Ambulating = 94%  Please briefly explain why patient needs home oxygen: Patient becomes short of breath with exertion.

## 2017-09-24 NOTE — Consult Note (Signed)
Denise [name] CM Primary Care Navigator  09/24/2017  Denise [name] 1945-08-11 [id number]   Met with patient, husband Denise [name]) and sister Denise [name]) at the bedside to identify possible discharge needs. Patient reportshaving "abdominal and back pain, constipation and shortness of breath" that had led to this admission. (acute respiratory failure with hypoxia, COPD, moderate pulmonary hypertension and SBO- small bowel obstruction)  Patient Denise Drivers, NP with [employer] at [name] as the primary care provider.   [facility] [address] [facility] to obtain medications withoutdifficulty.   Sisterstatesthat patient is Geographical information systems officer ownmedications at home with use of "pill box" system filled every 2 weeks.  Patient reportsthatshe has been driving prior to admission but her husband or her sister will provide transportation toher doctors'appointments if needed after discharge.  [employer] transportation benefits discussed with patient and family.  Patient's husband will be the primary caregiver for patient at home but her sister will be able to assist with patient's care needs as well.  Anticipated planfor discharge is home per patient with assistance of husband and sister.   Patientand family expressedunderstanding to call primary care provider's office for a post discharge follow-up appointment within1- 2 weeksor sooner if needs arise.Patient letter (with PCP's contact number) was provided as a reminder.  Per MD note, patient 's acute hypoxic respiratory failure is likely a combination of COPD and pulmonary hypertension. Patient is a former smoker. Pulmonology was consulted. Patient needing spirometry and pulmonary function test (PFTs).  Explained to patient and family about [name] CM services available for health management at home and had indicated need for follow-up. Patient is  aware of ways to manage her health conditions, but admits need to be monitored after discharge.  Patient had opted and verbally agreed with [facility] COPD calls to follow-up with her recovery at home.  Referral was made for [name] COPDcalls after discharge.  Patientand family expressed understanding to seekreferral to [name] care management from primary care provider if deemed necessary for further servicesin the future.   [name] care management information provided for future needs that patient may have.   For additional questions please contact:  Edwena Felty A. Troi Florendo, BSN, RN-BC [facility] PRIMARY CARE Navigator Cell: [phone]

## 2017-09-24 NOTE — Progress Notes (Addendum)
PCP: Alma Friendly, NP  Cardiologist: Jenkins Rouge, MD  EKG: 09/18/17 in [facility]  Stress test:   ECHO: 09/18/17 in [facility]  Cardiac Cath: 09/20/17 in [facility]  Chest x-ray: 09/18/17 in [facility]

## 2017-09-24 NOTE — Discharge Summary (Addendum)
Physician Discharge Summary  Denise [name] XFG:[id number] DOB: [birth year]  PCP: [name], NP  Admit date: 09/16/2017 Discharge date: 09/24/2017  Recommendations for Outpatient Follow-up:  1. Alma Friendly, PCP in 4 days with repeat labs (CBC & CMP). 2. Dr. Christinia Gully, Pulmonology in 2 weeks. 3. Dr. Ralene Ok, General Surgery: Follow-up regarding upcoming parathyroid surgery 4. Dr. Jenkins Rouge, Cardiology 5. Recommend urine microscopy in a couple of weeks to follow microscopic hematuria.   Home Health: None Equipment/Devices: Home oxygen via nasal cannula at 2 L/min continuously, Rolling walker & 3 n 1  Discharge Condition: Improved and stable CODE STATUS: Full Diet recommendation: Heart healthy diet.  Discharge Diagnoses:  Principal Problem:   Acute respiratory failure with hypoxia ([name]) Active Problems:   Pulmonary emphysema ([name])   Essential hypertension   Hyperparathyroidism, primary ([place])   History of colon cancer   Epigastric pain   Protein-calorie malnutrition, severe   Abnormal CT scan, gastrointestinal tract   Constipation   Other ascites   Pulmonary hypertension ([name])   SBO (small bowel obstruction) ([place])   Brief Summary: Denise [name] a 72 y.o.femalewithhistory of COPD previously not on home oxygen, hypertension, colon cancer status post resection, hyperparathyroidism scheduled for surgery 10/1, presented to the ER with complaints of increasing exertional shortness of breath over the last 1 week and epigastric pain.   ED Course:In the ER chest x-ray shows pleural effusion and congestion and possible consolidation per the CAT scan. CT abdomen pelvis done shows possible mass in the distal esophagus and also intra-and extrahepatic ductal dilation. LFTs are normal. Patient in the ER was given nebulizer treatment steroids Lasix for COPD versus CHF treatment.   Admitted for further work-up for the possible esophageal mass and  epigastric pain.In the ER patient was found to have urinary retention and was placed on Foley catheter.  Hospital course complicated by ongoing abdominal pain of unclear etiology.  [facility] seeing, initially did not think this was primary hepatobiliary or GI related and suspected due to right heart failure and cor pulmonale.  Cardiology was consulted, status post right heart cath and do not feel that her abdominal symptoms are related to cardiac issues.  Pulmonology consulted for moderate pulmonary artery hypertension.  Surgical team (due to have parathyroid surgery 10/1) alerted of patient's admission on 9/19.  Course complicated by CT evidence of SBO 9/20.    SBO resolved.  Assessment & Plan:   Acute Hypoxic respiratory Failure;   Likely combination of COPD and pulmonary hypertension.  Former smoker.  TTE 9/17 showed normal LVEF, grade 1 diastolic dysfunction and pulmonary hypertension with PA peak pressure 69 mmHg.  VQ scan showed no perfusion defect.  Low index of concern for pneumonia, Unasyn discontinued.  No clinical bronchospasm/COPD exacerbation.  Treated with IV Lasix and clinically appears compensated.   Pulmonology follow-up appreciated.  Recommend that since concern with cor pulmonale, would keep on oxygen for 3 months with goal of >90% at all times, follow-up in office, no role for vasodilator therapy but can be followed up during outpatient pulmonology visit with Dr. Lake Bells.   On day of discharge, assessed for home oxygen requirement and met criteria.  Desaturated to 85% on room air with activity requiring 2 L to bring up to 94%.  Gold stage II COPD Pulmonology input appreciated.  Pulmonology and cardiology indicate that her degree of cor pulmonale does not explain her abdominal pain. Pulmonology recommends putting back on Advair 250 mg, 1 puff twice daily and  as needed [place] at discharge.    Improved and stable. I discussed with Dr. Christinia Gully, Pulmonology who has cleared her  to undergo upcoming parathyroid surgery on 10/02/2017.  Moderate pulmonary artery hypertension Cardiology was consulted and patient underwent [place] 9/19 with findings as below (moderate by right heart cath).  VQ scan negative.  Pulmonology was consulted and input appreciated.  Needs spirometry and PFTs.  Abdominal pain/abnormal CT abdomen findings/new PSBO;  [facility] following and their input appreciated.  Abnormal esophagus noted on CT.  Esophagogram 9/18 showed esophageal dysmotility, distal esophagus appears slightly patulous and there may be an associated diverticulum.  EGD 06/2017 had shown normal esophagus and fundal gastritis.  As per GI, abnormal CT abdomen findings (mesenteric edema, pericholecystic edema, mild intra-/extrahepatic ductal dilatation, focal PD narrowing per CT, MRCP) may be due to right heart failure and do not represent cholecystitis.  Other findings felt to be artifact per Dr. Celesta Aver discussion with radiology.  GI recommended cardiology input and as noted above.   Patient failed 2 to 3 days of conservative treatment with enemas and bowel regimen.  Eventually KUB followed by CT abdomen 9/20 suggested SBO and ileus less likely.  She was treated conservatively with bowel rest, before NG tube could be placed she started having multiple BMs with clinical improvement.  KUB shows significant improvement.  Diet was gradually advanced which she has tolerated well.  Having regular BMs this morning.  Occasional mild abdominal pain.  As per GI sign off, continue MiraLAX 17 g once or twice daily to achieve a BM daily or every other day.  They have also added Pepcid to her regimen for esophageal issues. Finally her PSBO was felt to be multifactorial due to hypercalcemia, constipation/obstipation and cannot rule out adhesions.  Constipation: Complicated by hypercalcemia.  Bowel regimen as indicated above.  Esophageal dysmotility: Evaluation and management as per GI.  Please see above. CT  abdomen had questioned esophageal mass.  I discussed with Dr. Carlean Purl, GI on 9/19 and she does not have esophageal mass.  H2 blockers as indicated above.  Urinary retention; foley catheter.  Resolved.  Primary hyperparathyroidism, hypercalcemia;  Parathyroidectomy set for 10/02/17 with Dr Rosendo Gros. Calcium had peaked to 12, likely due to dehydration from poor oral intake complicating underlying hyperparathyroidism.  She was on no medications for hypercalcemia prior to admission.  She was started on calcitonin nasally and gentle IV fluids.  Calcium is improved to 10.4.  She will not be discharged on any medications for hypercalcemia but will need close follow-up of her levels in the next couple days with her PCP. As indicated, both cardiology and pulmonology have cleared her to undergo upcoming parathyroid surgery on 10/02/2017.  Hypokalemia; replaced.  NSVT: TTE shows preserved LVEF.  May be due to electrolyte abnormalities.  Replaced as needed.   Continue beta-blockers.  No further episodes of NSVT.  Patient has occasional infrequent episodes of nonsustained narrow complex tachycardia.  I discussed with rounding cardiologist today who feels that these are related to her pulmonary issues and recommends that unless they are sustained, no further intervention needed.  Elevated anion gap;  Resolved.  Essential hypertension:  Mildly uncontrolled.  Continue newly started metoprolol 25 mg twice daily.  Resume Cozaar 100 mg daily which had been held in the hospital while she was NPO.  Nifedipine discontinued due to initiation of metoprolol and episodes of tachycardia.  Follow as outpatient.  Microscopic hematuria Noted on urine microscopy 9/15.  May be related to acute urinary  retention and need for Foley catheter.  Recommend repeating urine microscopy in a couple of weeks and if hematuria persists, would recommend further evaluation.  Patient is asymptomatic.   Consultants:    GI  Cardiology  Pulmonology   Procedures:  ECHO; - Left ventricle: The cavity size was normal. There was moderate septal hypertrophy with otherwise mild concentric hypertrophy. Systolic function was normal. The estimated ejection fraction was in the range of 60% to 65%. There was dynamic obstruction at rest, with a peak velocity of 189 cm/sec and a peak gradient of 14 mm Hg. Wall motion was normal; there were no regional wall motion abnormalities. Doppler parameters are consistent with abnormal left ventricular relaxation (grade 1 diastolic dysfunction). - Aortic valve: Transvalvular velocity was within the normal range. There was no stenosis. There was mild regurgitation. - Mitral valve: Transvalvular velocity was within the normal range. There was no evidence for stenosis. There was trivial regurgitation. - Right ventricle: The cavity size was normal. Wall thickness was normal. Systolic function was normal. - Atrial septum: No defect or patent foramen ovale was identified by color flow Doppler. - Tricuspid valve: There was mild regurgitation. - Pulmonary arteries: Systolic pressure was severely increased. PA  peak pressure: 69 mm Hg (S).   Right heart cath 09/20/2017  1. Normal right and left heart filling pressures.  2. Moderate pulmonary arterial hypertension with very high PVR.  3. Low cardiac output.   Significant PAH. This may be group 3 PH in setting of COPD given history. V/Q scan was not suggestive of chronic PEs. She needs PFTs to see how severe COPD is. If pulmonary hypertension looks out of proportion to severity of COPD, would consider treatment.   Cardiologist not sure that cardiac output as obtained is correct. Oxygen saturation was up and down in the cath lab, we used a pulse ox saturation. It is possible saturation was truly lower and CO higher.    Discharge Instructions  Discharge Instructions    Call MD  for:  difficulty breathing, headache or visual disturbances   Complete by:  As directed    Call MD for:  extreme fatigue   Complete by:  As directed    Call MD for:  persistant dizziness or light-headedness   Complete by:  As directed    Call MD for:  persistant nausea and vomiting   Complete by:  As directed    Call MD for:  severe uncontrolled pain   Complete by:  As directed    Call MD for:  temperature >100.4   Complete by:  As directed    Diet - low sodium heart healthy   Complete by:  As directed    Increase activity slowly   Complete by:  As directed        Medication List    STOP taking these medications   naproxen sodium 220 MG tablet Commonly known as:  ALEVE   NIFEdipine 60 MG 24 hr tablet Commonly known as:  PROCARDIA XL/ADALAT-CC   omeprazole 20 MG capsule Commonly known as:  PRILOSEC     TAKE these medications   acetaminophen 325 MG tablet Commonly known as:  TYLENOL Take 2 tablets (650 mg total) by mouth every 6 (six) hours as needed for mild pain or moderate pain (or Fever >/= 101).   albuterol 108 (90 Base) MCG/ACT inhaler Commonly known as:  PROVENTIL HFA;VENTOLIN HFA Inhale 1-2 puffs into the lungs every 4 (four) hours as needed for wheezing or shortness of  breath.   aspirin EC 81 MG tablet Take 81 mg by mouth daily.   atorvastatin 10 MG tablet Commonly known as:  LIPITOR Take 1 tablet by mouth at bedtime for cholesterol.   dicyclomine 10 MG capsule Commonly known as:  BENTYL TAKE 1 CAPSULE BY MOUTH THREE TIMES DAILY BEFORE A MEAL AS NEEDED FOR STOMACH CRAMPING. What changed:  See the new instructions.   famotidine 40 MG tablet Commonly known as:  PEPCID Take 1 tablet (40 mg total) by mouth 2 (two) times daily.   Fluticasone-Salmeterol 250-50 MCG/DOSE Aepb Commonly known as:  ADVAIR Inhale 1 puff into the lungs 2 (two) times daily. What changed:  Another medication with the same name was removed. Continue taking this medication, and  follow the directions you see here.   losartan 100 MG tablet Commonly known as:  COZAAR Take 1 tablet by mouth once daily for blood pressure. What changed:    how much to take  how to take this  when to take this  additional instructions   Melatonin 5 MG Tabs Take 5 mg by mouth at bedtime as needed (sleep).   metoprolol tartrate 25 MG tablet Commonly known as:  LOPRESSOR Take 1 tablet (25 mg total) by mouth 2 (two) times daily.   mirtazapine 15 MG tablet Commonly known as:  REMERON Take 1 tablet (15 mg total) by mouth at bedtime. for depression and appetite.   multivitamin with minerals Tabs tablet Take 1 tablet by mouth daily. Start taking on:  09/25/2017   polyethylene glycol packet Commonly known as:  MIRALAX / GLYCOLAX Take 17 g by mouth 2 (two) times daily.   senna 8.6 MG Tabs tablet Commonly known as:  SENOKOT Take 2 tablets (17.2 mg total) by mouth at bedtime as needed for mild constipation or moderate constipation.   sertraline 25 MG tablet Commonly known as:  ZOLOFT Take 1 tablet by mouth once daily for depression. What changed:    how much to take  how to take this  when to take this  additional instructions      Follow-up Information    [name], NP. Schedule an appointment as soon as possible for a visit in 4 days.   Specialty:  Internal Medicine Why:  To be seen with repeat labs (CBC & CMP). Contact information: [place] [address] [phone]        [name], MD. Schedule an appointment as soon as possible for a visit.   Specialty:  Cardiology Contact information: [address] [place] [address] [place] [address] [phone]        Tanda Rockers, MD. Schedule an appointment as soon as possible for a visit in 2 week(s).   Specialty:  Pulmonary Disease Contact information: [address] [place] [address] [phone]        Ralene Ok, MD Follow up.   Specialty:   General Surgery Why:  Follow-up for your upcoming parathyroid surgery. Contact information: [address] [place] [address] [phone]        [facility] - Dme Follow up.   Why:  will deliver portable to your room and oxygen concentrator to your home Contact information: [address] [place] [address] [phone]          No Known Allergies    Procedures/Studies: Dg Chest 2 View  Result Date: 09/18/2017 CLINICAL DATA:  Short of breath history of COPD  EXAM: CHEST - 2 VIEW COMPARISON:  09/16/2017, 06/02/2016 FINDINGS: Hyperinflation with emphysematous disease. No focal consolidation or pleural effusion. Stable cardiomediastinal silhouette with aortic atherosclerosis. No pneumothorax. IMPRESSION: No active cardiopulmonary disease. Hyperinflation and emphysematous disease. Electronically Signed   By: Donavan Foil M.D.   On: 09/18/2017 14:52   Ct Abdomen Pelvis W Contrast  Result Date: 09/21/2017 CLINICAL DATA:  Lower abdominal pain with nausea. EXAM: CT ABDOMEN AND PELVIS WITH CONTRAST TECHNIQUE: Multidetector CT imaging of the abdomen and pelvis was performed using the standard protocol following bolus administration of intravenous contrast. CONTRAST:  160m OMNIPAQUE IOHEXOL 300 MG/ML  SOLN COMPARISON:  None. FINDINGS: Lower chest: Distal esophagus is patulous and filled with contrast material. Hepatobiliary: No focal abnormality within the liver parenchyma. Gallbladder decompressed. No intrahepatic or extrahepatic biliary dilation. Pancreas: No focal mass lesion. No dilatation of the main duct. No intraparenchymal cyst. No peripancreatic edema. Spleen: No splenomegaly. No focal mass lesion. Adrenals/Urinary Tract: No adrenal nodule or mass. stable appearance 13 mm cyst upper pole right kidney. Other bilateral renal cysts are evident measuring up to 5.5 cm in the left kidney. No evidence for hydroureter. Bladder is distended. Stomach/Bowel:  Assessment of bowel, specially in the pelvis, is limited by the presence of barium in the small bowel loops from barium esophagram 2 days ago. Stomach is markedly distended. Duodenum is distended. Small bowel loops are diffusely dilated measuring up to 3.7 cm in diameter proximally terminal ileum is not well seen. The appendix is normal. The colon is unremarkable. Assessment of the pelvis is degraded by streak artifact from dense contrast material in posterior pelvic small bowel loops. Vascular/Lymphatic: There is abdominal aortic atherosclerosis without aneurysm. There is no gastrohepatic or hepatoduodenal ligament lymphadenopathy. No intraperitoneal or retroperitoneal lymphadenopathy. Reproductive: Unremarkable. Other: No substantial free fluid. Musculoskeletal: No worrisome lytic or sclerotic osseous abnormality. IMPRESSION: 1. Marked distention of the stomach and small bowel. There is contrast material in a patulous distal esophagus likely reflecting reflux. A discrete small-bowel transition zone cannot be identified and assessment of bowel loops is limited by the barium scattered throughout the small bowel from a barium study 2 days ago. None of the barium from that exam has reached the colon and no enteric contrast from today's study has reached the colon. There is a gradual tapering of dilated jejunal loops down to more modestly dilated ileal loops. Imaging features concerning for small bowel obstruction. Severe ileus considered less likely but possible. 2. Bilateral renal cysts. 3.  Aortic Atherosclerois (ICD10-170.0) Electronically Signed   By: EMisty StanleyM.D.   On: 09/21/2017 20:36   Ct Abdomen Pelvis W Contrast  Result Date: 09/17/2017 CLINICAL DATA:  Acute generalized abdominal pain. Vomiting. History of colon cancer resected 1989. EXAM: CT ABDOMEN AND PELVIS WITH CONTRAST TECHNIQUE: Multidetector CT imaging of the abdomen and pelvis was performed using the standard protocol following bolus  administration of intravenous contrast. CONTRAST:  1073mOMNIPAQUE IOHEXOL 300 MG/ML  SOLN COMPARISON:  06/14/2017 FINDINGS: Lower chest: Small bilateral pleural effusions with basilar atelectasis or consolidation. As seen previously there is an abnormal appearance of the distal esophagus and EG junction with poor definition of the esophageal wall and edema around the distal esophagus. Lymphadenopathy of the EG junction. Appearance is suspicious for esophageal neoplasm. If not previously evaluated, endoscopy may be of use. Hepatobiliary: No focal liver lesions. Gallbladder is distended without wall thickening or inflammatory changes. Mild intra and extrahepatic bile duct dilatation without cause identified. Correlation with liver function studies is  recommended. Could consider follow-up with elective MRCP. Pancreas: Unremarkable. No pancreatic ductal dilatation or surrounding inflammatory changes. Spleen: Normal in size without focal abnormality. Adrenals/Urinary Tract: Bilateral renal cysts. Nephrograms are symmetrical. No hydronephrosis or hydroureter. No adrenal gland nodules. Bladder is partially decompressed with a Foley catheter. Stomach/Bowel: Diffusely stool-filled colon. Stomach, small bowel, and colon are not abnormally distended. Mesenteric edema particularly in the pelvis. No obvious wall thickening although under distention would limit evaluation of bowel wall. Appendix is normal. Vascular/Lymphatic: Diffuse calcification of the abdominal aorta. No significant retroperitoneal lymphadenopathy. Reproductive: Status post hysterectomy. No adnexal masses. Other: Small amount of free fluid in the upper abdomen and pelvis. No free air. Surgical scarring in the anterior abdominal wall. Musculoskeletal: No destructive bone lesions. IMPRESSION: 1. Small bilateral pleural effusions with basilar atelectasis or consolidation. 2. Abnormal appearance of the distal esophagus and EG junction with poor definition of the  esophageal wall and edema around the distal esophagus. Lymphadenopathy in the junction. Appearance is suspicious for esophageal neoplasm. Correlation with endoscopy is recommended. 3. Mild intra and extrahepatic bile duct dilatation without cause identified. Correlation with liver function studies is recommended. Consider follow-up with elective MRCP. 4. Mesenteric edema particularly in the pelvis. This could represent secondary changes due to enteritis. No evidence of bowel obstruction but bowel wall is not completely evaluated due to under distention. 5. Small amount of free fluid in the abdomen and pelvis. Aortic Atherosclerosis (ICD10-I70.0). Electronically Signed   By: Lucienne Capers M.D.   On: 09/17/2017 01:35   Dg Esophagus  Result Date: 09/19/2017 CLINICAL DATA:  Epigastric pain, abnormal CT scan. EXAM: ESOPHOGRAM/BARIUM SWALLOW TECHNIQUE: Single contrast examination was performed using  thin barium. FLUOROSCOPY TIME:  Fluoroscopy Time:  2 minutes 0 seconds. Radiation Exposure Index (if provided by the fluoroscopic device): Number of Acquired Spot Images: 0 COMPARISON:  CT abdomen pelvis 09/17/2017. FINDINGS: There is poor esophageal motility. No esophageal fold thickening, stricture or obstruction. The distal esophagus, at the gastroesophageal junction, appears somewhat patulous. There may be an associated diverticulum. Esophagus is otherwise unremarkable. IMPRESSION: 1. Esophageal dysmotility. 2. Distal esophagus appears slightly patulous and there may be an associated diverticulum. Electronically Signed   By: Lorin Picket M.D.   On: 09/19/2017 12:12   Mr Abdomen Mrcp Wo Contrast  Result Date: 09/17/2017 CLINICAL DATA:  Suspect pancreatitis.  Atypical presentation. EXAM: MRI ABDOMEN WITHOUT CONTRAST  (INCLUDING MRCP) TECHNIQUE: Multiplanar multisequence MR imaging of the abdomen was performed. Heavily T2-weighted images of the biliary and pancreatic ducts were obtained, and three-dimensional  MRCP images were rendered by post processing. COMPARISON:  CT abdomen and pelvis 09/17/2017 FINDINGS: Lower chest: Small bilateral pleural effusions with basilar atelectasis. Hepatobiliary: No focal lesions are demonstrated in the liver. The gallbladder is distended with layering of sludge in the gallbladder, mild gallbladder wall thickening, and with edema around the gallbladder and extending up along the liver edge. This probably reflects acute cholecystitis. Bile ducts are mildly dilated with evidence of filling defects in the mid and distal common bile duct suggesting common duct stones. Pancreas: Pancreatic duct is not significantly dilated but there is a persistent area of narrowing demonstrated at the body of the pancreas. This could represent inflammatory stricture or an intraductal neoplasm. Pancreatic parenchyma appears homogeneous. Spleen: Spleen size is normal. Several accessory spleens are present. No focal lesions. Adrenals/Urinary Tract: Multiple bilateral renal cysts. No hydronephrosis or hydroureter. Stomach/Bowel: Visualized stomach, small, and large bowel are not abnormally distended. Vascular/Lymphatic: Normal caliber abdominal aorta and inferior vena  cava. No significant lymphadenopathy. Other:  None. Musculoskeletal: Lumbar scoliosis convex towards the right. Mostly fatty replaced marrow. No expansile bone lesions are appreciated. IMPRESSION: 1. Probable cholecystitis and cholelithiasis as above. 2. Intra and extrahepatic bile duct dilatation. Probable choledocholithiasis. 3. Focal area of persistent narrowing of the pancreatic duct at the body. No specific mass identified. This could represent inflammatory stricture or intraductal neoplasm. 4. Small amount of free fluid in the upper abdomen, likely reactive. Electronically Signed   By: Lucienne Capers M.D.   On: 09/17/2017 06:26   Mr 3d Recon At Scanner  Result Date: 09/17/2017 CLINICAL DATA:  Suspect pancreatitis.  Atypical presentation.  EXAM: MRI ABDOMEN WITHOUT CONTRAST  (INCLUDING MRCP) TECHNIQUE: Multiplanar multisequence MR imaging of the abdomen was performed. Heavily T2-weighted images of the biliary and pancreatic ducts were obtained, and three-dimensional MRCP images were rendered by post processing. COMPARISON:  CT abdomen and pelvis 09/17/2017 FINDINGS: Lower chest: Small bilateral pleural effusions with basilar atelectasis. Hepatobiliary: No focal lesions are demonstrated in the liver. The gallbladder is distended with layering of sludge in the gallbladder, mild gallbladder wall thickening, and with edema around the gallbladder and extending up along the liver edge. This probably reflects acute cholecystitis. Bile ducts are mildly dilated with evidence of filling defects in the mid and distal common bile duct suggesting common duct stones. Pancreas: Pancreatic duct is not significantly dilated but there is a persistent area of narrowing demonstrated at the body of the pancreas. This could represent inflammatory stricture or an intraductal neoplasm. Pancreatic parenchyma appears homogeneous. Spleen: Spleen size is normal. Several accessory spleens are present. No focal lesions. Adrenals/Urinary Tract: Multiple bilateral renal cysts. No hydronephrosis or hydroureter. Stomach/Bowel: Visualized stomach, small, and large bowel are not abnormally distended. Vascular/Lymphatic: Normal caliber abdominal aorta and inferior vena cava. No significant lymphadenopathy. Other:  None. Musculoskeletal: Lumbar scoliosis convex towards the right. Mostly fatty replaced marrow. No expansile bone lesions are appreciated. IMPRESSION: 1. Probable cholecystitis and cholelithiasis as above. 2. Intra and extrahepatic bile duct dilatation. Probable choledocholithiasis. 3. Focal area of persistent narrowing of the pancreatic duct at the body. No specific mass identified. This could represent inflammatory stricture or intraductal neoplasm. 4. Small amount of free  fluid in the upper abdomen, likely reactive. Electronically Signed   By: Lucienne Capers M.D.   On: 09/17/2017 06:26   Nm Pulmonary Perf And Vent  Result Date: 09/17/2017 CLINICAL DATA:  72 year old female with shortness of breath. Small bilateral pleural effusions on CT Abdomen and Pelvis earlier today. EXAM: NUCLEAR MEDICINE VENTILATION - PERFUSION LUNG SCAN TECHNIQUE: Ventilation images were obtained in multiple projections using inhaled aerosol Tc-15mDTPA. Perfusion images were obtained in multiple projections after intravenous injection of Tc-96mAA. RADIOPHARMACEUTICALS:  32.2 mCi of Tc-9967mPA aerosol inhalation and 4.2 mCi Tc99m34m IV COMPARISON:  CT Abdomen and Pelvis 0119 hours today. AP chest x-ray 09/16/2017. FINDINGS: Ventilation: Mild artifactual clumping of the ventilation tracer, but relatively homogeneous tracer distribution. No convincing ventilation defect. Perfusion: Homogeneous perfusion radiotracer distribution in both lungs. No perfusion defect. IMPRESSION: No perfusion defect, no evidence of pulmonary embolus. Electronically Signed   By: H  HGenevie Ann.   On: 09/17/2017 15:53   Dg Abdomen Acute W/chest  Result Date: 09/16/2017 CLINICAL DATA:  Patient with abdominal pain. EXAM: DG ABDOMEN ACUTE W/ 1V CHEST COMPARISON:  Chest radiograph 06/02/2016 FINDINGS: Monitoring leads overlie the patient. Cardiac contours upper limits of normal. Aortic atherosclerosis. Bilateral interstitial pulmonary opacities. Small left pleural effusion. Gas is demonstrated within nondilated  loops of bowel throughout the abdomen. Stool throughout the colon. Lumbar spine degenerative changes. IMPRESSION: Cardiomegaly with pulmonary vascular redistribution and mild interstitial edema. Small left effusion. Nonobstructed bowel gas pattern. Stool throughout the colon. Electronically Signed   By: Lovey Newcomer M.D.   On: 09/16/2017 21:13   Dg Abd Portable 1v  Result Date: 09/23/2017 CLINICAL DATA:  Small-bowel  obstruction protocol. EXAM: PORTABLE ABDOMEN - 1 VIEW COMPARISON:  09/22/2017.  CT, 09/21/2017. FINDINGS: Contrast has progressed into the colon. Colon is normal in caliber. Previously seen small bowel dilation has mostly resolved. IMPRESSION: 1. Significant interval improvement in the presumed partial small bowel obstruction. Contrast has now extended into a normal caliber colon. Electronically Signed   By: Lajean Manes M.D.   On: 09/23/2017 08:20   Dg Abd Portable 1v  Result Date: 09/22/2017 CLINICAL DATA:  Small bowel obstruction. EXAM: PORTABLE ABDOMEN - 1 VIEW COMPARISON:  Radiographs and CT 09/21/2017.  CT and MRCP 09/17/2017. FINDINGS: A large amount of barium remains within the stomach and small bowel. The small bowel remains diffusely dilated. No enteric contrast is seen within the colon which remains decompressed. There is no extraluminal fluid collection. Of note, this bowel distension has developed since the CT 5 days ago. IMPRESSION: Findings remain consistent with a distal small bowel obstruction which has developed over the last 5 days. No contrast enters the colon. Electronically Signed   By: Richardean Sale M.D.   On: 09/22/2017 09:57   Dg Abd Portable 1v  Result Date: 09/21/2017 CLINICAL DATA:  Follow-up constipation EXAM: PORTABLE ABDOMEN - 1 VIEW COMPARISON:  09/17/2017 FINDINGS: Previously administered contrast material is again noted within the stomach but also within the small bowel. Mildly dilated small bowel is noted. No free air is seen. No bony abnormality is noted. IMPRESSION: Contrast material scattered throughout the small bowel from recent esophagram. This suggests significantly delayed small bowel transit time. Partial small bowel obstruction could not be excluded Electronically Signed   By: Inez Catalina M.D.   On: 09/21/2017 09:08      Subjective: Feels much better.  Tolerated diet.  Had regular BMs x2 this morning.  Mild and intermittent nonspecific abdominal pain.   No chest pain or dyspnea.  As per RN, ambulated without assistance but requires oxygen due to hypoxia.  No back pain reported.  Denies any other complaints.  Discharge Exam:  Vitals:   09/24/17 0843 09/24/17 1126 09/24/17 1145 09/24/17 1510  BP:    (!) 172/98  Pulse:    78  Resp:    18  Temp:    99.8 F (37.7 C)  TempSrc:    Oral  SpO2: 92% 100% 96% 90%  Weight:      Height:        General exam: Pleasant elderly female, moderately built, frail and thinly nourished, looks much improved compared to 2 to 3 days ago, seen ambulating comfortably around her bed this morning. Respiratory system: Clear to auscultation.  No increased work of breathing. Cardiovascular system: S1 & S2 heard, RRR. No JVD, murmurs, rubs, gallops or clicks. No pedal edema.  Telemetry personally reviewed: Sinus rhythm with occasional PVCs. Gastrointestinal system: Abdomen is nondistended, soft, and nontender.  No organomegaly or masses appreciated.  Normal bowel sounds heard.   Central nervous system: Alert and oriented. No focal neurological deficits.   Extremities: Symmetric 5 x 5 power. Skin: No rashes, lesions or ulcers Psychiatry: Judgement and insight appear normal. Mood & affect appropriate.  The results of significant diagnostics from this hospitalization (including imaging, microbiology, ancillary and laboratory) are listed below for reference.      Labs: CBC: Recent Labs  Lab 09/18/17 0511 09/19/17 0627 09/20/17 0509  WBC 8.4 7.9 6.8  HGB 14.7 16.1* 14.5  HCT 45.5 51.0* 45.4  MCV 87.2 88.4 87.3  PLT 260 270 094   Basic Metabolic Panel: Recent Labs  Lab 09/20/17 0509 09/21/17 0633 09/22/17 0540 09/23/17 0523 09/24/17 0646  NA 141 142 140 139 137  K 3.7 3.9 3.3* 4.8 4.5  CL 102 99 94* 103 104  CO2 _0 GLUCOSE 125* 146* 142* 123* 130*  BUN 25* 20 25* 31* 23  CREATININE 0.87 0.76 0.95 0.99 0.84  CALCIUM 10.9* 11.3* 12.0* 10.9* 10.4*  MG  --   --  2.1  --   --     Liver Function Tests: Recent Labs  Lab 09/18/17 0511 09/19/17 0627  AST 22 19  ALT 22 20  ALKPHOS 89 92  BILITOT 1.2 1.0  PROT 6.1* 6.3*  ALBUMIN 3.5 3.6   BNP (last 3 results) Recent Labs    09/16/17 2005  BNP 310.4*    CBG: Recent Labs  Lab 09/23/17 2029 09/24/17 0002 09/24/17 0432 09/24/17 0740 09/24/17 1139  GLUCAP 101* 110* 126* 127* 145*   Urinalysis    Component Value Date/Time   COLORURINE YELLOW 09/16/2017 2002   APPEARANCEUR CLEAR 09/16/2017 2002   LABSPEC 1.010 09/16/2017 2002   PHURINE 6.0 09/16/2017 2002   GLUCOSEU 50 (A) 09/16/2017 2002   HGBUR SMALL (A) 09/16/2017 2002   BILIRUBINUR NEGATIVE 09/16/2017 2002   KETONESUR 5 (A) 09/16/2017 2002   PROTEINUR NEGATIVE 09/16/2017 2002   NITRITE NEGATIVE 09/16/2017 2002   LEUKOCYTESUR NEGATIVE 09/16/2017 2002    At patient's request, I discussed in detail with patient's sister, updated care and answered questions.  Time coordinating discharge: 60 minutes  SIGNED:  Vernell Leep, MD, FACP, [facility]. [facility] Pager [phone]  If 7PM-7AM, please contact night-coverage [email or web address] Password [facility] 09/24/2017, 4:17 PM

## 2017-09-24 NOTE — Progress Notes (Signed)
          Daily Rounding Note  09/24/2017, 10:26 AM  LOS: 7 days   SUBJECTIVE:   Chief complaint:  Abdominal pain.  Much better   Had small BM this AM.  Only minor LLQ pain.   In better spirits.  Has been ok'd for discharge to home this afternoon.    OBJECTIVE:         Vital signs in last 24 hours:    Temp:  [98 F (36.7 C)-98.9 F (37.2 C)] 98.9 F (37.2 C) (09/23 0431) Pulse Rate:  [70-79] 73 (09/23 0431) Resp:  [15-19] 19 (09/23 0431) BP: (140-160)/(72-100) 160/100 (09/23 0431) SpO2:  [92 %-99 %] 92 % (09/23 0843) Weight:  [56.9 kg] 56.9 kg (09/23 0431) Last BM Date: 09/24/17 Filed Weights   09/22/17 0647 09/23/17 0650 09/24/17 0431  Weight: 54.9 kg 54.7 kg 56.9 kg   General: looks better, more alert.  Sitting up, more conversant and bright affect.  Still frail and chronically ill looking   Heart: RRR Chest: clear bil.  No cough or dyspnea Abdomen: soft, NT, ND.  Active BS  Extremities: no CCE Neuro/Psych:  Alert. Oriented x 3.  No gross deficits.    Intake/Output from previous day: 09/22 0701 - 09/23 0700 In: 2396.8 [P.O.:1080; I.V.:1208.8; IV Piggyback:108] Out: 401 [Urine:400; Stool:1]  Intake/Output this shift: Total I/O In: 240 [P.O.:240] Out: -   Lab Results: No results for input(s): WBC, HGB, HCT, PLT in the last 72 hours. BMET Recent Labs    09/22/17 0540 09/23/17 0523 09/24/17 0646  NA 140 139 137  K 3.3* 4.8 4.5  CL 94* 103 104  CO2 _0 GLUCOSE 142* 123* 130*  BUN 25* 31* 23  CREATININE 0.95 0.99 0.84  CALCIUM 12.0* 10.9* 10.4*   LFT No results for input(s): PROT, ALBUMIN, AST, ALT, ALKPHOS, BILITOT, BILIDIR, IBILI in the last 72 hours. PT/INR No results for input(s): LABPROT, INR in the last 72 hours. Hepatitis Panel No results for input(s): HEPBSAG, HCVAB, HEPAIGM, HEPBIGM in the last 72 hours.  Studies/Results: Dg Abd Portable 1v  Result Date: 09/23/2017 CLINICAL DATA:   Small-bowel obstruction protocol. EXAM: PORTABLE ABDOMEN - 1 VIEW COMPARISON:  09/22/2017.  CT, 09/21/2017. FINDINGS: Contrast has progressed into the colon. Colon is normal in caliber. Previously seen small bowel dilation has mostly resolved. IMPRESSION: 1. Significant interval improvement in the presumed partial small bowel obstruction. Contrast has now extended into a normal caliber colon. Electronically Signed   By: Lajean Manes M.D.   On: 09/23/2017 08:20    ASSESMENT:   *   PSBO.  In setting of hypercalcemia, constipation/obstipation.  Much improved with resolution of constipation.  *   Hyperparathyroidism due for surgery 10/1//2019.    *   Pulmonary htn, type 3 due to underlying pulmonary dz.  [name]     PLAN   *   Miralax 17 gm  qday to BID, can titrate up/down with goal of daily vs qod BM's.      Azucena Freed  09/24/2017, 10:26 AM Phone [phone]

## 2017-09-24 NOTE — Discharge Instructions (Signed)

## 2017-09-25 ENCOUNTER — Telehealth: Payer: Self-pay | Admitting: *Deleted

## 2017-09-25 ENCOUNTER — Other Ambulatory Visit: Payer: Self-pay

## 2017-09-25 ENCOUNTER — Encounter (HOSPITAL_COMMUNITY): Payer: Self-pay

## 2017-09-25 ENCOUNTER — Encounter (HOSPITAL_COMMUNITY)
Admission: RE | Admit: 2017-09-25 | Discharge: 2017-09-25 | Disposition: A | Discharge status: Home / Self Care | Payer: Medicare HMO | Source: Ambulatory Visit | Attending: General Surgery | Admitting: General Surgery

## 2017-09-25 DIAGNOSIS — Z01812 Encounter for preprocedural laboratory examination: Secondary | ICD-10-CM | POA: Diagnosis not present

## 2017-09-25 DIAGNOSIS — J449 Chronic obstructive pulmonary disease, unspecified: Secondary | ICD-10-CM | POA: Diagnosis not present

## 2017-09-25 DIAGNOSIS — E213 Hyperparathyroidism, unspecified: Secondary | ICD-10-CM | POA: Diagnosis not present

## 2017-09-25 DIAGNOSIS — I10 Essential (primary) hypertension: Secondary | ICD-10-CM | POA: Diagnosis not present

## 2017-09-25 DIAGNOSIS — I272 Pulmonary hypertension, unspecified: Secondary | ICD-10-CM | POA: Diagnosis not present

## 2017-09-25 DIAGNOSIS — I2729 Other secondary pulmonary hypertension: Secondary | ICD-10-CM | POA: Insufficient documentation

## 2017-09-25 DIAGNOSIS — E119 Type 2 diabetes mellitus without complications: Secondary | ICD-10-CM | POA: Diagnosis not present

## 2017-09-25 DIAGNOSIS — J439 Emphysema, unspecified: Secondary | ICD-10-CM | POA: Diagnosis not present

## 2017-09-25 LAB — GLUCOSE, CAPILLARY: GLUCOSE-CAPILLARY: 138 mg/dL — AB (ref 70–99)

## 2017-09-25 LAB — HEMOGLOBIN A1C
HEMOGLOBIN A1C: 5.7 % — AB (ref 4.8–5.6)
MEAN PLASMA GLUCOSE: 116.89 mg/dL

## 2017-09-25 NOTE — Telephone Encounter (Signed)
Transition Care Management Follow-up Telephone Call   Date discharged? 09/24/2017   How have you been since you were released from the hospital? "ok just a little weak"   Do you understand why you were in the hospital? yes   Do you understand the discharge instructions? yes   Where were you discharged to? home   Items Reviewed:  Medications reviewed: yes  Allergies reviewed: yes  Dietary changes reviewed: yes  Referrals reviewed: yes   Functional Questionnaire:   Activities of Daily Living (ADLs):   She states they are independent in the following: independent in all areas    Any transportation issues/concerns?: no   Any patient concerns? no   Confirmed importance and date/time of follow-up visits scheduled yes  Provider Appointment booked with [name], 9/26  Confirmed with patient if condition begins to worsen call PCP or go to the ER.  Patient was given the office number and encouraged to call back with question or concerns.  : yes

## 2017-09-26 ENCOUNTER — Telehealth: Payer: Self-pay

## 2017-09-26 NOTE — Telephone Encounter (Signed)
[facility] Pre-operative Risk Assessment    Request for surgical clearance:  1. What type of surgery is being performed?  Parathyroid Exploration   2. When is this surgery scheduled?  TBD   3. What type of clearance is required (medical clearance vs. Pharmacy clearance to hold med vs. Both)?  Medical  4. Are there any medications that need to be held prior to surgery and how long?    5. Practice name and name of physician performing surgery?  [facility]   6. What is your office phone number [phone]    7.   What is your office fax number [phone]  8.   Anesthesia type (None, local, MAC, general) ?  general   Denise [name] 09/26/2017, 4:01 PM  _________________________________________________________________   (provider comments below)

## 2017-09-26 NOTE — Telephone Encounter (Signed)
Dr. Johnsie Cancel we were contacted by [facility] for pre op clearance for parathyroid exploration.  She had Rt heart cath and significant PAH plan at discharge to see pulmonary.   Please give opinion.  Thanks.

## 2017-09-27 ENCOUNTER — Ambulatory Visit (INDEPENDENT_AMBULATORY_CARE_PROVIDER_SITE_OTHER): Payer: Medicare HMO | Admitting: Primary Care

## 2017-09-27 ENCOUNTER — Other Ambulatory Visit: Payer: Self-pay | Admitting: *Deleted

## 2017-09-27 VITALS — BP 150/100 | HR 72 | Temp 97.8°F | Ht 65.0 in | Wt 123.2 lb

## 2017-09-27 DIAGNOSIS — I10 Essential (primary) hypertension: Secondary | ICD-10-CM

## 2017-09-27 DIAGNOSIS — R3129 Other microscopic hematuria: Secondary | ICD-10-CM

## 2017-09-27 DIAGNOSIS — F419 Anxiety disorder, unspecified: Secondary | ICD-10-CM

## 2017-09-27 DIAGNOSIS — Z09 Encounter for follow-up examination after completed treatment for conditions other than malignant neoplasm: Secondary | ICD-10-CM

## 2017-09-27 DIAGNOSIS — R319 Hematuria, unspecified: Secondary | ICD-10-CM

## 2017-09-27 DIAGNOSIS — E210 Primary hyperparathyroidism: Secondary | ICD-10-CM

## 2017-09-27 DIAGNOSIS — R531 Weakness: Secondary | ICD-10-CM

## 2017-09-27 DIAGNOSIS — F32A Depression, unspecified: Secondary | ICD-10-CM

## 2017-09-27 DIAGNOSIS — I272 Pulmonary hypertension, unspecified: Secondary | ICD-10-CM | POA: Diagnosis not present

## 2017-09-27 DIAGNOSIS — J438 Other emphysema: Secondary | ICD-10-CM

## 2017-09-27 DIAGNOSIS — Z23 Encounter for immunization: Secondary | ICD-10-CM | POA: Diagnosis not present

## 2017-09-27 DIAGNOSIS — F329 Major depressive disorder, single episode, unspecified: Secondary | ICD-10-CM

## 2017-09-27 DIAGNOSIS — R634 Abnormal weight loss: Secondary | ICD-10-CM | POA: Diagnosis not present

## 2017-09-27 DIAGNOSIS — K56609 Unspecified intestinal obstruction, unspecified as to partial versus complete obstruction: Secondary | ICD-10-CM

## 2017-09-27 DIAGNOSIS — J449 Chronic obstructive pulmonary disease, unspecified: Secondary | ICD-10-CM | POA: Diagnosis not present

## 2017-09-27 HISTORY — DX: Hematuria, unspecified: R31.9

## 2017-09-27 MED ORDER — MIRTAZAPINE 15 MG PO TABS: 15.0000 mg | ORAL_TABLET | Freq: Every day | ORAL | 1 refills | Status: DC

## 2017-09-27 NOTE — Assessment & Plan Note (Signed)
Following with Dr. Rosendo Gros. Surgery scheduled for October 1st.

## 2017-09-27 NOTE — Assessment & Plan Note (Addendum)
Diagnosed via right heart catheterization and on echocardiogram during recent hospital stay. Continue current regimen. Following with pulmonology.  All hospital notes, imaging, labs reviewed. Repeat BMP and CBC pending.

## 2017-09-27 NOTE — Addendum Note (Signed)
Addended by: Jacqualin Combes on: 09/27/2017 02:15 PM   Modules accepted: Orders

## 2017-09-27 NOTE — Telephone Encounter (Signed)
   Primary Cardiologist: Jenkins Rouge, MD  Chart reviewed as part of pre-operative protocol coverage. Given past medical history and time since last visit, based on ACC/AHA guidelines, Denise [name] would be at acceptable risk for the planned procedure without further cardiovascular testing.  She was just hospitalized for heart failure, I did discuss with Dr. Johnsie Cancel.   BUT please note pt should have clearance from Pulmonary due to multiple lung issues.  She does have significant Pulmonary hypertension.    I will route this recommendation to the requesting party via Epic fax function and remove from pre-op pool.  Please call with questions.  Cecilie Kicks, NP 09/27/2017, 8:36 AM

## 2017-09-27 NOTE — Telephone Encounter (Signed)
From a cardiac standpoint she is ok for surgery but would need to be cleared by pulmonary She is to see Aundra Dubin in f/u to consider starting dilators but her PA HTN is moderate

## 2017-09-27 NOTE — Assessment & Plan Note (Signed)
Doing well on current regimen, continue same.

## 2017-09-27 NOTE — Assessment & Plan Note (Signed)
Has resumed Advair daily and endorses daily (24 hour) oxygen use. Following with pulmonology.

## 2017-09-27 NOTE — Progress Notes (Signed)
Subjective:    Patient ID: Denise [name], female    DOB: February 15, 1945, 72 y.o.   MRN: [id number]  HPI  Denise [name] is a 72 year old female with a history of pulmonary emphysema, respiratory failure, small bowel obstruction, hyperparathyroidism, colon cancer who presents today for hospital follow up.  She presented to [facility] on 09/16/17 with a chief complaint of epigastric abdominal pain with radiation to the back with nausea, vomiting, shortness of breath. She was treated with nebulized albuterol with ipratropium, IV steroids, oxygen, and furosemide. CT scan with potential esophageal mass, also with urinary retention (catheter placed), chest xray with pleural effusion and congestion. She was admitted for further evaluation.   During her hospital stay she was found to have pulmonary artery hypertension  which is a new diagnosis found during right cardiac catheterization. VQ scan was negative. Echo with LV EF of 60-65%, severely elevated PA pressures, grade 1 diastolic dysfunction. She was initiated on metoprolol 25 mg BID. Nifedipine was discontinued due to initiation of metoprolol and episodes of tachycardia.   She was noted to have microscopic hematuria from urinalysis on 09/15. She was initiated on home oxygen at 2 liters due to desaturation to 85% on room air prior to discharge. Pulmonology was consulted regarding pulmonary hypertension. Advair 250/50 was re-initiated, PRN albuterol as well. She was determined not to have esophageal mass. She was found to have a small bowel obstruction which resolved. She was discharged home on Miralax 17 g 1-2 times daily with a goal of daily to every other day bowel movement. It was recommended she follow up with cardiology, pulmonology, and PCP for further evaluation. She was discharged home on 09/23.  Since discharge home she's compliant to her metoprolol twice daily. She is no longer taking nifedipine. She's having diarrhea several times daily on two  packets of Miralax daily. Her sister doesn't think she's using her oxygen 24 hours a day. She has two sisters with her today who are helping to manage her medications and post hospital visits. She does not have home health consultations. She is moving forward with parathyroid surgery in October.   BP Readings from Last 3 Encounters:  09/27/17 (!) 150/100  09/25/17 135/86  09/24/17 (!) 172/98   She's overall feeling better, continues to have little appetite, some exertional shortness of breath. She denies chest pain. She is needing a refill of her mirtazapine. She has noticed several episodes of diarrhea daily since starting Miralax.   Review of Systems  Constitutional: Negative for fever.  Respiratory: Negative for shortness of breath.   Cardiovascular: Negative for chest pain.  Gastrointestinal: Positive for diarrhea. Negative for abdominal pain, nausea and vomiting.  Skin: Negative for color change.  Psychiatric/Behavioral:       Feels well managed on Zoloft and mirtazapine.        Past Medical History:  Diagnosis Date  . Altered mental status   . Anxiety and depression   . Colon cancer ([place]) 1988   Resected  . COPD (chronic obstructive pulmonary disease) ([place])   . Diabetes mellitus without complication ([name])    diet controlled- no meds. per pt  . Diverticulosis   . Hyperlipidemia   . Hypertension   . Insomnia   . Primary hyperparathyroidism ([place])   . Tubular adenoma of rectum 02/23/2013   low grade  . Vitamin D deficiency      Social History   Socioeconomic History  . Marital status: Married    Spouse name: Not on  file  . Number of children: 3  . Years of education: Not on file  . Highest education level: Not on file  Occupational History  . Not on file  Social Needs  . Financial resource strain: Not on file  . Food insecurity:    Worry: Not on file    Inability: Not on file  . Transportation needs:    Medical: Not on file    Non-medical: Not on file    Tobacco Use  . Smoking status: Former Smoker    Types: Cigarettes    Last attempt to quit: 1989    Years since quitting: 30.7  . Smokeless tobacco: Never Used  Substance and Sexual Activity  . Alcohol use: No  . Drug use: No  . Sexual activity: Yes    Partners: Male    Birth control/protection: Post-menopausal, Surgical  Lifestyle  . Physical activity:    Days per week: Not on file    Minutes per session: Not on file  . Stress: Not on file  Relationships  . Social connections:    Talks on phone: Not on file    Gets together: Not on file    Attends religious service: Not on file    Active member of club or organization: Not on file    Attends meetings of clubs or organizations: Not on file    Relationship status: Not on file  . Intimate partner violence:    Fear of current or ex partner: Not on file    Emotionally abused: Not on file    Physically abused: Not on file    Forced sexual activity: Not on file  Other Topics Concern  . Not on file  Social History Narrative   Married. 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 ABDOMINAL HYSTERECTOMY  08/2015  . BREAST BIOPSY Left   . BREAST EXCISIONAL BIOPSY Right   . COLON RESECTION  1980's  . COLONOSCOPY    . ENDOMETRIAL BIOPSY  2009   negative  . INCONTINENCE SURGERY  2017  . RIGHT HEART CATH N/A 09/20/2017   Procedure: RIGHT HEART CATH;  Surgeon: Larey Dresser, MD;  Location: [facility];  Service: Cardiovascular;  Laterality: N/A;    Family History  Problem Relation Age of Onset  . Ovarian cancer Mother   . Breast cancer Neg Hx   . Hyperparathyroidism Neg Hx   . Colon cancer Neg Hx   . Esophageal cancer Neg Hx   . Stomach cancer Neg Hx     No Known Allergies  Current Outpatient Medications on File Prior to Visit  Medication Sig Dispense Refill  .  acetaminophen (TYLENOL) 325 MG tablet Take 2 tablets (650 mg total) by mouth every 6 (six) hours as needed for mild pain or moderate pain (or Fever >/= 101).    [name] albuterol (PROVENTIL HFA;VENTOLIN HFA) 108 (90 Base) MCG/ACT inhaler Inhale 1-2 puffs into the lungs every 4 (four) hours as needed for wheezing or shortness of breath. 3 Inhaler 0  . aspirin EC 81 MG tablet Take 81 mg by mouth daily.    [name] atorvastatin (LIPITOR) 10 MG tablet Take 1 tablet by mouth at bedtime for cholesterol. 90 tablet 1  . famotidine (PEPCID) 40 MG tablet Take 1 tablet (40 mg total) by mouth  2 (two) times daily. 60 tablet 0  . Fluticasone-Salmeterol (ADVAIR DISKUS) 250-50 MCG/DOSE AEPB Inhale 1 puff into the lungs 2 (two) times daily. 1 each 0  . losartan (COZAAR) 100 MG tablet Take 1 tablet by mouth once daily for blood pressure. (Patient taking differently: Take 100 mg by mouth daily. for blood pressure.) 90 tablet 1  . Melatonin 5 MG TABS Take 5 mg by mouth at bedtime as needed (sleep).    . metoprolol tartrate (LOPRESSOR) 25 MG tablet Take 1 tablet (25 mg total) by mouth 2 (two) times daily. 60 tablet 0  . Multiple Vitamin (MULTIVITAMIN WITH MINERALS) TABS tablet Take 1 tablet by mouth daily.    . polyethylene glycol (MIRALAX) packet Take 17 g by mouth 2 (two) times daily. 30 each 0  . senna (SENOKOT) 8.6 MG TABS tablet Take 2 tablets (17.2 mg total) by mouth at bedtime as needed for mild constipation or moderate constipation. 30 each 0  . sertraline (ZOLOFT) 25 MG tablet Take 1 tablet by mouth once daily for depression. (Patient taking differently: Take 25 mg by mouth daily. for depression.) 90 tablet 1   No current facility-administered medications on file prior to visit.     BP (!) 150/100   Pulse 72   Temp 97.8 F (36.6 C) (Oral)   Ht _0  (1.651 m)   Wt 123 lb 4 oz (55.9 kg)   SpO2 90%   BMI 20.51 kg/m    Objective:   Physical Exam  Constitutional: She is oriented to person, place, and time. She  appears well-nourished.  Neck: Neck supple.  Cardiovascular: Normal rate and regular rhythm.  Appears euvolemic   Respiratory: Effort normal and breath sounds normal.  GI: Soft. Bowel sounds are normal. There is no tenderness.  Bowel sounds noted throughout abdomen.  Neurological: She is alert and oriented to person, place, and time.  Skin: Skin is warm and dry.  Psychiatric: She has a normal mood and affect.           Assessment & Plan:

## 2017-09-27 NOTE — Patient Outreach (Signed)
[facility]) Care Management  09/27/2017  Enid Derry A. Reimann 1945/04/14 [id number]   EMMI-COPD  RED ON EMMI ALERT Day # 1 Date: 09/26/17 1004  Red Alert Reason: feeling worse overall? Yes Harder to breathe? Yes    Outreach attempt # 1 successful but not a good time to complete assessment  Patient is able to verify HIPAA [place] Management RN reviewed and addressed red alert with patient She reports she is still having issues with breathing when not on her oxygen NP seen today and NP increased rx which Mrs Tavenner feels is "good " but she has further questions not answered    Social:  She has support of husband, sister and daughter Maudie Mercury)   Appointments: Alma Friendly NP on 0/26/19   Consent: [facility] RN CM reviewed [facility] services with patient. Patient gave verbal consent for services. Advised patient that there will be further automated EMMI- post discharge calls to assess how the patient is doing following the recent hospitalization Advised the patient that another call may be received from a nurse if any of their responses were abnormal. Patient voiced understanding and was appreciative of f/u call.   Plan: [facility] RN CM will return a call to patient within 4 business days if she does not return cm call  CM provided CM contact number CM noted to have to repeat number x 6 and slowly  [facility] RN CM sent a successful outreach letter as discussed with [facility] brochure enclosed for review   Demarian Epps L. Lavina Hamman, RN, BSN, [place] Coordinator Office number [phone] Mobile number [phone]  Main [facility] number [phone] Fax number [phone]

## 2017-09-27 NOTE — Patient Instructions (Addendum)
Continue metoprolol tartrate twice daily for your heart and blood pressure. No not take nifedipine medication for blood pressure. Continue taking losartan.    Continue taking famotidine for heartburn symptoms.  Continue Advair inhaler twice daily.  Wear your oxygen all day, everyday until you see Dr. Melvyn Novas. Please schedule an appointment with Dr. Gustavus Bryant office.  Schedule an appointment with Dr. Johnsie Cancel with cardiology.  Reduce consumption of miralax. You should be having soft bowel movements once daily to once every other day.  Stop by the lab prior to leaving today. I will notify you of your results once received.   You will be contacted regarding your referral to home health.  Please let us know if you have not been contacted within one week.   I sent refills of your mirtazapine to the pharmacy.  Best of luck with your upcoming surgery! It was a pleasure to see you today!

## 2017-09-27 NOTE — Assessment & Plan Note (Signed)
Noted on UA from hospital stay.  Repeat UA pending, she cannot provide a sample today and will return when able.

## 2017-09-27 NOTE — Assessment & Plan Note (Signed)
Above goal in the office today. Working with pulmonology for PA hypertension. She is compliant to metoprolol 25 mg BID and Losartan. She is not taking nifedipine. Discussed the risks of complete heart block if taking both beta blocker with dihydropyridine CCB.   Also suspect hyperparathyroidism to be contributing to increased pressures. She will undergo surgical intervention next week.   She will follow up with cardiology and pulmonology soon.

## 2017-09-27 NOTE — Progress Notes (Signed)
Anesthesia Chart Review:  Case:  [id number] Date:  10/02/17   Procedure:  PARATHYROID EXPLORATION (N/A ) (canceled)   Anesthesia type:  General   Pre-op diagnosis:  HYPERPARATHYROID   Location:  [facility]   Surgeon:  Ralene Ok, MD      DISCUSSION: Pt recently hospitalized 09/16/17-09/24/17 with new diagnosis of pulm HTN and cor pulmonale, started on continuous O2, awaiting followup pulmonology eval. I spoke with Shela Leff at Dr. Johney Frame office regarding surgery. Above case has been cancelled pending pulmonology clearance.  Wynonia Musty [facility] Short Stay Center/Anesthesiology Phone [phone] 09/27/2017 11:08 AM

## 2017-09-27 NOTE — Assessment & Plan Note (Signed)
Resolved. Exam today unremarkable. Discussed to reduce use of Miralax and titrate to goal of one soft bowel movement daily to every other day. She and her sister verbalized understanding.

## 2017-09-28 ENCOUNTER — Other Ambulatory Visit (INDEPENDENT_AMBULATORY_CARE_PROVIDER_SITE_OTHER): Payer: Medicare HMO

## 2017-09-28 ENCOUNTER — Encounter: Payer: Self-pay | Admitting: Primary Care

## 2017-09-28 ENCOUNTER — Other Ambulatory Visit: Payer: Self-pay | Admitting: *Deleted

## 2017-09-28 DIAGNOSIS — R3129 Other microscopic hematuria: Secondary | ICD-10-CM | POA: Diagnosis not present

## 2017-09-28 DIAGNOSIS — I10 Essential (primary) hypertension: Secondary | ICD-10-CM | POA: Diagnosis not present

## 2017-09-28 DIAGNOSIS — R634 Abnormal weight loss: Secondary | ICD-10-CM

## 2017-09-28 LAB — POC URINALSYSI DIPSTICK (AUTOMATED)
BILIRUBIN UA: NEGATIVE
GLUCOSE UA: NEGATIVE
Ketones, UA: NEGATIVE
Leukocytes, UA: NEGATIVE
NITRITE UA: NEGATIVE
Protein, UA: NEGATIVE
RBC UA: NEGATIVE
Spec Grav, UA: 1.01 (ref 1.010–1.025)
Urobilinogen, UA: 0.2 E.U./dL
pH, UA: 6.5 (ref 5.0–8.0)

## 2017-09-28 LAB — BASIC METABOLIC PANEL
BUN: 11 mg/dL (ref 7–25)
CHLORIDE: 102 mmol/L (ref 98–110)
CO2: 24 mmol/L (ref 20–32)
CREATININE: 0.84 mg/dL (ref 0.60–0.93)
Calcium: 11 mg/dL — ABNORMAL HIGH (ref 8.6–10.4)
Glucose, Bld: 130 mg/dL — ABNORMAL HIGH (ref 65–99)
Potassium: 3.6 mmol/L (ref 3.5–5.3)
Sodium: 138 mmol/L (ref 135–146)

## 2017-09-28 LAB — CBC
HEMATOCRIT: 39.1 % (ref 35.0–45.0)
Hemoglobin: 13.1 g/dL (ref 11.7–15.5)
MCH: 27.7 pg (ref 27.0–33.0)
MCHC: 33.5 g/dL (ref 32.0–36.0)
MCV: 82.7 fL (ref 80.0–100.0)
MPV: 11.8 fL (ref 7.5–12.5)
Platelets: 281 10*3/uL (ref 140–400)
RBC: 4.73 10*6/uL (ref 3.80–5.10)
RDW: 13.4 % (ref 11.0–15.0)
WBC: 5.8 10*3/uL (ref 3.8–10.8)

## 2017-09-28 NOTE — Addendum Note (Signed)
Addended by: Pilar Grammes on: 09/28/2017 03:59 PM   Modules accepted: Orders

## 2017-09-28 NOTE — Addendum Note (Signed)
Addended by: Barbaraann Faster on: 09/28/2017 02:58 PM   Modules accepted: Orders

## 2017-09-28 NOTE — Addendum Note (Signed)
Addended by: Jacqualin Combes on: 09/28/2017 04:07 PM   Modules accepted: Orders

## 2017-09-28 NOTE — Patient Outreach (Addendum)
[facility]) Care Management  09/28/2017  Enid Derry A. Masih 1945-01-26 [id number]   Care coordination- oxygen delivery follow up  [facility] RN CM returned a call to Mrs Oberle to follow up on the status of her oxygen  She reports [facility] staff arrived to her home and she is now using her oxygen 2 L [place] without issues. Her husband has arrived and she will be going to get her labs completed later in the day  She states she took CM suggestion and has water and apple sauce "I feel better."  She and CM did discuss loose stools. She had discussed them with her MD 09/27/17 and was informed to decrease doses of miralax when having loose stools. Her frequency has decreased and she want to have more formed stools.  Encourage intake of fluids and continuation of soft foods to make stools more formed Reviewed a list of soft foods Mrs Serafin voiced appreciation of the follow up call and services rendered  She states she had a fall after she was discharge She reports "missing the bed" She denies injury and her husband assisted her up without concerns  Advance directives she has a living will and is interested in [place] in the future  She reports her primary gave her a rx for feeling "a little down" and she is getting it filled today  Plan refer Mrs Aveni to [facility] community RN CM for disease management as she agreed to  [employer]. Lavina Hamman, RN, BSN, [place] Coordinator Office number [phone] Mobile number [phone]  Main [facility] number [phone] Fax number [phone]

## 2017-09-28 NOTE — Patient Outreach (Addendum)
[facility]) Care Management  09/28/2017  Enid Derry A. Laflam 02/24/1945 [id number]   EMMI-COPD  RED ON EMMI ALERT Day # 1 Date: 09/26/17 1004  Red Alert Reason: feeling worse overall? Yes Harder to breathe? Yes    Outreach attempt # 2 initially unsuccessful at the home and mobile numbers  No answer. [facility] RN CM left HIPAA compliant voicemail message along with CM's contact info at both numbers    Shortly after leaving the last message at the mobile number Mrs Bonnet returned a call to [facility] RN CM Patient is able to verify HIPAA Reviewed and addressed referral to [facility] with patient  Today Mrs Weyandt states she is feeling better "I'm hanging in there." She reports she is breathing better She does sound less sob today vs on 09/27/17  Her answers to the EMMI call was correct and she did seek medical assistance on 09/27/17 to resolve the concerns She reports that she is continues to feel week with poor strength in her legs, attempting to increase her water to stay hydrated and is awaiting a visit from the home health agency about her oxygen concerns. She reports her oxygen is not working well and they called the home health agency last night who reports there will be a visit between 11- 3 pm today  She also reports a poor appetite, she has lost "lots of weight" and she has an upcoming thyroid surgery scheduled for 10/02/17     [facility] RN CM assessed further for other concerns as Mrs Rahl mentioned briefly on 09/27/17 she still had questions when leaving her MD appointment Mrs Corrow shares her concerns about her COPD. She inquires if it is curable and "if I will get rid of it." CM discussed with her that COPD is not curable but manageable   Social: Mrs Wandersee lives with her husband and is needing assist at this time with her iADLs, She can complete her ADLs but at a slower pace.  She reports she is not allowed to drive at this time therefore her husband is  driving her to medical appointments  Mr Daw works She has support of husband, sister and daughter Maudie Mercury) She walks with a walker in the home and used oxygen at 2 L [place]  She is on a soft diet but reports feeling full. CM discussed soft diet foods like apple sauce, puddings, jello, intake of small frequent meals and nutritional supplement drinks She likes Glucerna  Conditions: COPD/pulmonary emphysema, HTN, pulmonary HTN (new), SBO (new-resolved at hospital), DM type 2 diet controlled), hyperparathyroidism, vitamin D deficiency, anxiety and depression, insomnia, hyperlipidemia, hx of altered mental status, hx of colon cancer, severe protein calorie malnutrition, constipation, GERD, former smoker Scheduled for hyperparathyriod surgery in October 2019    Medications gets medications via [employer] order  denies concerns with taking medications as prescribed, affording medications, side effects of medications and questions about medications   Appointments: Mr Kissel is getting off work today at 2 pm to take Mrs Glasner to her get labs completed at [facility] in [place] NP was seen on 09/27/17   Advance Directives: Consent: [facility] RN CM reviewed [facility] services with patient. Patient gave verbal consent for services [facility] RN CM for disease management.  Advised patient that there will be further automated EMMI-post discharge calls to assess how the patient is doing following the recent hospitalization Advised the patient that another call may be received from a nurse if any of their  responses were abnormal. Patient voiced understanding and was appreciative of f/u call.   Plan: [facility] RN CM will follow up with Mrs Defelice on DME (oxygen) arrival Again CM provided CM contact number to Mrs Defelice. CM was noted to have to speak slowly today so she could write down information  [facility] RN CM also discussed and provided the standard 24 hour line number [phone] for concerns after 5 pm Mondays through Fridays and weekends She voiced appreciation for [employer] L. Lavina Hamman, RN, BSN, [place] Coordinator Office number [phone] Mobile number [phone]  Main [facility] number [phone] Fax number [phone]

## 2017-09-29 ENCOUNTER — Other Ambulatory Visit: Payer: Self-pay | Admitting: Primary Care

## 2017-09-29 DIAGNOSIS — R634 Abnormal weight loss: Secondary | ICD-10-CM

## 2017-09-29 DIAGNOSIS — F419 Anxiety disorder, unspecified: Secondary | ICD-10-CM

## 2017-09-29 DIAGNOSIS — F329 Major depressive disorder, single episode, unspecified: Secondary | ICD-10-CM

## 2017-10-01 ENCOUNTER — Other Ambulatory Visit: Payer: Self-pay | Admitting: *Deleted

## 2017-10-01 NOTE — Patient Outreach (Signed)
[facility]) Care Management  10/01/2017  Enid Derry A. Canion 06/30/1945 [id number]   Referral received from telephonic care manager requesting community care manager involvement for assessment of needs in the home regarding management of COPD.  Recent hospitalization 9/15-9/23 for respiratory failure, primary MD office performed transition of care assessment.  Per chart, she also has history of hypertension, pulmonary hypertension, hyperlipidemia, and hyperparathyroidism for which she is scheduled for surgery tomorrow.    Call placed to member, no answer.  HIPAA compliant voice message left, will await callback.  Unsuccessful outreach letter sent.  Will follow up within the next 4 business days.  Valente David, [place], MSN [place] [phone]

## 2017-10-02 ENCOUNTER — Ambulatory Visit (INDEPENDENT_AMBULATORY_CARE_PROVIDER_SITE_OTHER): Payer: Medicare HMO | Admitting: Internal Medicine

## 2017-10-02 ENCOUNTER — Encounter (HOSPITAL_COMMUNITY): Admission: RE | Payer: Self-pay | Source: Ambulatory Visit

## 2017-10-02 ENCOUNTER — Encounter: Payer: Self-pay | Admitting: Internal Medicine

## 2017-10-02 ENCOUNTER — Ambulatory Visit (HOSPITAL_COMMUNITY): Admission: RE | Admit: 2017-10-02 | Payer: Medicare HMO | Source: Ambulatory Visit | Admitting: General Surgery

## 2017-10-02 DIAGNOSIS — J9611 Chronic respiratory failure with hypoxia: Secondary | ICD-10-CM

## 2017-10-02 DIAGNOSIS — J432 Centrilobular emphysema: Secondary | ICD-10-CM | POA: Diagnosis not present

## 2017-10-02 DIAGNOSIS — I272 Pulmonary hypertension, unspecified: Secondary | ICD-10-CM | POA: Diagnosis not present

## 2017-10-02 DIAGNOSIS — J449 Chronic obstructive pulmonary disease, unspecified: Secondary | ICD-10-CM | POA: Diagnosis not present

## 2017-10-02 HISTORY — DX: Chronic respiratory failure with hypoxia: J96.11

## 2017-10-02 SURGERY — EXPLORATION, PARATHYROID
Anesthesia: General

## 2017-10-02 MED ORDER — BUDESONIDE-FORMOTEROL FUMARATE 160-4.5 MCG/ACT IN AERO: 2.0000 | INHALATION_SPRAY | Freq: Two times a day (BID) | RESPIRATORY_TRACT | 6 refills | Status: DC

## 2017-10-02 MED ORDER — BUDESONIDE-FORMOTEROL FUMARATE 160-4.5 MCG/ACT IN AERO: 2.0000 | INHALATION_SPRAY | Freq: Two times a day (BID) | RESPIRATORY_TRACT | Status: DC

## 2017-10-02 NOTE — Patient Instructions (Signed)
Plan A = Automatic = symbicort 160 Take 2 puffs first thing in am and then another 2 puffs about 12 hours later.   Work on inhaler technique:  relax and gently blow all the way out then take a nice smooth deep breath back in, triggering the inhaler at same time you start breathing in.  Hold for up to 5 seconds if you can. Blow out thru nose. Rinse and gargle with water when done      Plan B = Backup Only use your albuterol as a rescue medication to be used if you can't catch your breath by resting or doing a relaxed purse lip breathing pattern.  - The less you use it, the better it will work when you need it. - Ok to use the inhaler up to 2 puffs  every 4 hours if you must but call for appointment if use goes up over your usual need - Don't leave home without it !!  (think of it like the spare tire for your car)    Please schedule a follow up office visit in 2  weeks, sooner if needed

## 2017-10-02 NOTE — Assessment & Plan Note (Signed)
Assoc with ? Cor pulmonale dx 09/2017 so rx 02 1-2 lpm 24/7   Goals of LT02 rx reviewed with  Pt and fm   I had an extended discussion with the patient/fm reviewing all relevant studies completed to date and  lasting 25 minutes of a 40  minute post hosp transition of care office  visit addressing several    non-specific but potentially very serious refractory respiratory symptoms of uncertain and potentially multiple  Etiologies.   See device teaching which extended face to face time for this visit    Each maintenance medication was reviewed in detail including most importantly the difference between maintenance and prns and under what circumstances the prns are to be triggered using an action plan format that is not reflected in the computer generated alphabetically organized AVS.    Please see AVS for specific instructions unique to this office visit that I personally wrote and verbalized to the the pt in detail and then reviewed with pt  by my nurse highlighting any changes in therapy/plan of care  recommended at today's visit.

## 2017-10-02 NOTE — Assessment & Plan Note (Signed)
See [facility] 09/20/17 with nl pressures but low CO ? Accuracy   rx for now is 02 24/7 to keep sats > 90% and consider repeat echo but I strongly suspect whatever PH she has is all chronic cor pulmonale which is not a contraindication to relatively simple extra-thoracic surgery

## 2017-10-02 NOTE — Progress Notes (Signed)
Subjective:     Patient ID: Denise [name], female   DOB: 09-24-1945,    MRN: [id number]    Brief patient profile:  17 yobf  Quit smoking 1989 with doe then dx'd with copd in [place] in mid 1990s where lived x 25 years and started on spiriva around 2005-10 and helped some and since 2017 back in [place] and breathing generally better in [place] but not doing as many steps and changed from spiriva to symb but not taking it regulary and no need for rescue inhaler referred to pulmonary clinic 09/11/2016 by Dr   Malena Edman.    History of Present Illness  09/11/2016 1st [facility] office visit/ Ervine Witucki  Re GOLD II copd  Chief Complaint  Patient presents with  . pulmonary consult    Referred by Dr. Carlis Abbott for COPD. patient states she is out of breath on exertion, denies chest pain and chest tightness  doe = MMRC2 = can't walk a nl pace on a flat grade s sob but does fine slow and flat eg shopping  rec Plan A = Automatic =  symbicort 160 up to 2 every 12 hours Work on inhaler technique:  Plan B = Backup Only use your albuterol (ventolin) as a rescue medication Return in 3 months     10/02/2017 extended post hosp f/u ov/Valeriano Bain re: copd /clearance for parathyroid surgery Chief Complaint  Patient presents with  . Hospitalization Follow-up    admitted to hospital 09/16/17 until 09/24/17. She was sent home with o2 but not using it much. She states she has occ SOB with exertion. She is using her ventolin inhaler 4 x daily on average.   Dyspnea:  MMRC3 = can't walk 100 yards even at a slow pace at a flat grade s stopping due to sob   Cough: not much at al now  Sleeping: bed flat/ 2 pillows  SABA use: 4 x daily - was less while on symb 160 vs advair 250 though hfa quite poor (see copd a/p)  02:  1-2lpm     NOT DONE  obvious day to day or daytime variability or assoc excess/ purulent sputum or mucus plugs or hemoptysis or cp or chest tightness, subjective wheeze or overt sinus or hb symptoms.    Sleeping fine as above  without nocturnal  or early am exacerbation  of respiratory  c/o's or need for noct saba. Also denies any obvious fluctuation of symptoms with weather or environmental changes or other aggravating or alleviating factors except as outlined above   No unusual exposure hx or h/o childhood pna/ asthma or knowledge of premature birth.  Current Allergies, Complete Past Medical History, Past Surgical History, Family History, and Social History were reviewed in [employer] record.  ROS  The following are not active complaints unless bolded Hoarseness, sore throat, dysphagia, dental problems, itching, sneezing,  nasal congestion or discharge of excess mucus or purulent secretions, ear ache,   fever, chills, sweats, unintended wt loss or wt gain, classically pleuritic or exertional cp,  orthopnea pnd or arm/hand swelling  or leg swelling minimal , presyncope, palpitations, abdominal pain, anorexia, nausea, vomiting, diarrhea  or change in bowel habits or change in bladder habits, change in stools or change in urine, dysuria, hematuria,  rash, arthralgias, visual complaints, headache, numbness, weakness or ataxia or problems with walking or coordination,  change in mood or  memory.        Current Meds  Medication Sig  . acetaminophen (TYLENOL)  325 MG tablet Take 2 tablets (650 mg total) by mouth every 6 (six) hours as needed for mild pain or moderate pain (or Fever >/= 101).  [name] albuterol (PROVENTIL HFA;VENTOLIN HFA) 108 (90 Base) MCG/ACT inhaler Inhale 1-2 puffs into the lungs every 4 (four) hours as needed for wheezing or shortness of breath.  [name] aspirin EC 81 MG tablet Take 81 mg by mouth daily.  [name] atorvastatin (LIPITOR) 10 MG tablet Take 1 tablet by mouth at bedtime for cholesterol.  . famotidine (PEPCID) 40 MG tablet Take 1 tablet (40 mg total) by mouth 2 (two) times daily.  . Fluticasone-Salmeterol (ADVAIR DISKUS) 250-50 MCG/DOSE AEPB Inhale 1 puff into  the lungs 2 (two) times daily.  [name] losartan (COZAAR) 100 MG tablet Take 1 tablet by mouth once daily for blood pressure. (Patient taking differently: Take 100 mg by mouth daily. for blood pressure.)  . Melatonin 5 MG TABS Take 5 mg by mouth at bedtime as needed (sleep).  . metoprolol tartrate (LOPRESSOR) 25 MG tablet Take 1 tablet (25 mg total) by mouth 2 (two) times daily.  . mirtazapine (REMERON) 15 MG tablet Take 1 tablet (15 mg total) by mouth at bedtime. for depression and appetite.  . Multiple Vitamin (MULTIVITAMIN WITH MINERALS) TABS tablet Take 1 tablet by mouth daily.  . polyethylene glycol (MIRALAX) packet Take 17 g by mouth 2 (two) times daily.  [name] senna (SENOKOT) 8.6 MG TABS tablet Take 2 tablets (17.2 mg total) by mouth at bedtime as needed for mild constipation or moderate constipation.  . sertraline (ZOLOFT) 25 MG tablet Take 1 tablet by mouth once daily for depression. (Patient taking differently: Take 25 mg by mouth daily. for depression.)           Objective:   Physical Exam   w/c bound elderly pleasant bf nad   10/02/2017        119   07/27/16 144 lb 12.8 oz (65.7 kg)  06/02/16 136 lb 6.4 oz (61.9 kg)  11/22/15 156 lb 12.8 oz (71.1 kg)    . Vital signs reviewed - Note on arrival 02 sats  96% on 1lpm      HEENT: nl dentition / oropharynx. Nl external ear canals without cough reflex -  Mild bilateral non-specific turbinate edema     NECK :  without JVD/Nodes/TM/ nl carotid upstrokes bilaterally   LUNGS: no acc muscle use,  Mod barrel  contour chest wall with bilateral  Distant bs s audible wheeze and  without cough on insp or exp maneuver and mod  Hyperresonant  to  percussion bilaterally     CV:  RRR  no s3 or murmur or increase in P2, and no edema   ABD:  soft and nontender with pos mid  insp Hoover's  in the supine position. No bruits or organomegaly appreciated, bowel sounds nl  MS:     ext warm with gen muscle wasting s callf tenderness, cyanosis or  clubbing No obvious joint restrictions   SKIN: warm and dry without lesions    NEURO:  alert, approp, nl sensorium with  no motor or cerebellar deficits apparent.          I personally reviewed images and agree with radiology impression as follows:  CXR:   09/18/17  No active cardiopulmonary disease. Hyperinflation and emphysematous disease.     . Labs ordered/ reviewed:     Chemistry      Component Value Date/Time   NA 138 09/28/2017 1559  K 3.6 09/28/2017 1559   CL 102 09/28/2017 1559   CO2 24 09/28/2017 1559   BUN 11 09/28/2017 1559   CREATININE 0.84 09/28/2017 1559      Component Value Date/Time   CALCIUM 11.0 (H) 09/28/2017 1559   ALKPHOS 92 09/19/2017 0627   AST 19 09/19/2017 0627   ALT 20 09/19/2017 0627   BILITOT 1.0 09/19/2017 0627        Lab Results  Component Value Date   WBC 5.8 09/28/2017   HGB 13.1 09/28/2017   HCT 39.1 09/28/2017   MCV 82.7 09/28/2017   PLT 281 09/28/2017         Lab Results  Component Value Date   TSH 0.44 04/11/2017         Assessment:

## 2017-10-02 NOTE — Assessment & Plan Note (Signed)
Quit smoking 1989 - Spirometry 09/11/2016  FEV1 1.03 (52%)  Ratio 54 mild curvature, on no rx - 09/11/2016  After extensive coaching HFA effectiveness =    50% from baseline 10% > continue  symb 160 2bid but consider change to bevespi or stiolto  - 10/02/2017  After extensive coaching inhaler device,  effectiveness =    50% from a baseline near 0 > rechallenge with symbicort 160 x 2 bid x 2 weeks then re-group    Has remained relatively stable but now perceives need for saba qid while on advair whereas previously on symb 160 2bid did not need saba much at all so needs to restart symb 160 2bid trial and return for preop pfts to see if can clear for parathyroidectomy  Since hypercalcemia mild at best no urgency to do parathyroidectomy and I doubt this is the cause of her recent hx of wt loss/ failure to thrive

## 2017-10-03 ENCOUNTER — Ambulatory Visit (INDEPENDENT_AMBULATORY_CARE_PROVIDER_SITE_OTHER): Payer: Medicare HMO | Admitting: Physician Assistant

## 2017-10-03 ENCOUNTER — Encounter: Payer: Self-pay | Admitting: Physician Assistant

## 2017-10-03 VITALS — BP 138/82 | HR 69 | Ht 65.0 in | Wt 119.8 lb

## 2017-10-03 DIAGNOSIS — I272 Pulmonary hypertension, unspecified: Secondary | ICD-10-CM | POA: Diagnosis not present

## 2017-10-03 DIAGNOSIS — J449 Chronic obstructive pulmonary disease, unspecified: Secondary | ICD-10-CM | POA: Diagnosis not present

## 2017-10-03 DIAGNOSIS — I110 Hypertensive heart disease with heart failure: Secondary | ICD-10-CM | POA: Diagnosis not present

## 2017-10-03 DIAGNOSIS — I5032 Chronic diastolic (congestive) heart failure: Secondary | ICD-10-CM

## 2017-10-03 DIAGNOSIS — I10 Essential (primary) hypertension: Secondary | ICD-10-CM

## 2017-10-03 DIAGNOSIS — R1010 Upper abdominal pain, unspecified: Secondary | ICD-10-CM

## 2017-10-03 NOTE — Progress Notes (Signed)
Cardiology Office Note    Date:  10/03/2017   ID:  Denise [name], [place] [birth year], MRN [id number]  PCP:  [name], NP  Cardiologist:  Dr. Johnsie Cancel   Chief Complaint: Hospital follow up   History of Present Illness:   Denise [name] is a 72 y.o. female with a hx of COPD, hypertension, HLD, DM 2, colon cancer s/p resection and hyperparathyroidism presents for hospital follow up.   Admitted CHF exacerbation and abdominal pain.  Right heart cath with PA pressure 57/16 mmHg and PCWP only 5 mmHg. Plan to follow up with Dr. Aundra Dubin as outpatient. VQ scan negative. Also found to have abdominal mass and esophageal dysmotility. Patient is cleared for parathyroid exploration however recommended pulmonary clearance as well. Seen by Dr. Mertha Finders yesterday and recommended to wait for surgery for 2 weeks until further evaluation.  Here today for follow up. She has no complains. Uses oxygen at night and while ambulating. Denies orthopnea, PND, syncope, melena, chest pain, palpitations of dizziness. Mild intermittent LE edema mostly after eating high salt intake. Resolved abdominal pain.    Past Medical History:  Diagnosis Date  . Altered mental status   . Anxiety and depression   . Colon cancer ([place]) 1988   Resected  . COPD (chronic obstructive pulmonary disease) ([place])   . Diabetes mellitus without complication ([name])    diet controlled- no meds. per pt  . Diverticulosis   . Hyperlipidemia   . Hypertension   . Insomnia   . Primary hyperparathyroidism ([place])   . Tubular adenoma of rectum 02/23/2013   low grade  . Vitamin D deficiency     Past Surgical History:  Procedure Laterality Date  . ABDOMINAL HYSTERECTOMY  08/2015  . BREAST BIOPSY Left   . BREAST EXCISIONAL BIOPSY Right   . COLON RESECTION  1980's  . COLONOSCOPY    . ENDOMETRIAL BIOPSY  2009   negative  . INCONTINENCE SURGERY  2017  . RIGHT HEART CATH N/A 09/20/2017   Procedure: RIGHT HEART CATH;  Surgeon:  Larey Dresser, MD;  Location: [facility];  Service: Cardiovascular;  Laterality: N/A;    Current Medications: Prior to Admission medications   Medication Sig Start Date End Date Taking? Authorizing Provider  acetaminophen (TYLENOL) 325 MG tablet Take 2 tablets (650 mg total) by mouth every 6 (six) hours as needed for mild pain or moderate pain (or Fever >/= 101). 09/24/17   Hongalgi, Lenis Dickinson, MD  albuterol (PROVENTIL HFA;VENTOLIN HFA) 108 (90 Base) MCG/ACT inhaler Inhale 1-2 puffs into the lungs every 4 (four) hours as needed for wheezing or shortness of breath. 05/22/17   [name], NP  aspirin EC 81 MG tablet Take 81 mg by mouth daily.    [provider]  atorvastatin (LIPITOR) 10 MG tablet Take 1 tablet by mouth at bedtime for cholesterol. 05/22/17   [name], NP  budesonide-formoterol [facility]) 160-4.5 MCG/ACT inhaler Inhale 2 puffs into the lungs 2 (two) times daily. 10/02/17   Tanda Rockers, MD  famotidine (PEPCID) 40 MG tablet Take 1 tablet (40 mg total) by mouth 2 (two) times daily. 09/24/17   Hongalgi, Lenis Dickinson, MD  losartan (COZAAR) 100 MG tablet Take 1 tablet by mouth once daily for blood pressure. Patient taking differently: Take 100 mg by mouth daily. for blood pressure. 05/22/17   [name], NP  Melatonin 5 MG TABS Take 5 mg by mouth at bedtime as needed (sleep).  [provider]  metoprolol tartrate (LOPRESSOR) 25 MG tablet Take 1 tablet (25 mg total) by mouth 2 (two) times daily. 09/24/17   Hongalgi, Lenis Dickinson, MD  mirtazapine (REMERON) 15 MG tablet Take 1 tablet (15 mg total) by mouth at bedtime. for depression and appetite. 09/27/17   [name], NP  Multiple Vitamin (MULTIVITAMIN WITH MINERALS) TABS tablet Take 1 tablet by mouth daily. 09/25/17   Hongalgi, Lenis Dickinson, MD  polyethylene glycol [facility]) packet Take 17 g by mouth 2 (two) times daily. 09/24/17   Hongalgi, Lenis Dickinson, MD  senna (SENOKOT) 8.6 MG TABS tablet Take 2  tablets (17.2 mg total) by mouth at bedtime as needed for mild constipation or moderate constipation. 09/24/17   Hongalgi, Lenis Dickinson, MD  sertraline (ZOLOFT) 25 MG tablet Take 1 tablet by mouth once daily for depression. Patient taking differently: Take 25 mg by mouth daily. for depression. 05/22/17   [name], NP    Allergies:   Patient has no known allergies.   Social History   Socioeconomic History  . Marital status: Married    Spouse name: Not on file  . Number of children: 3  . Years of education: Not on file  . Highest education level: Not on file  Occupational History  . Not on file  Social Needs  . Financial resource strain: Not on file  . Food insecurity:    Worry: Not on file    Inability: Not on file  . Transportation needs:    Medical: Not on file    Non-medical: Not on file  Tobacco Use  . Smoking status: Former Smoker    Packs/day: 1.00    Years: 30.00    Pack years: 30.00    Types: Cigarettes    Last attempt to quit: 1989    Years since quitting: 30.7  . Smokeless tobacco: Never Used  Substance and Sexual Activity  . Alcohol use: No  . Drug use: No  . Sexual activity: Yes    Partners: Male    Birth control/protection: Post-menopausal, Surgical  Lifestyle  . Physical activity:    Days per week: Not on file    Minutes per session: Not on file  . Stress: Not on file  Relationships  . Social connections:    Talks on phone: Not on file    Gets together: Not on file    Attends religious service: Not on file    Active member of club or organization: Not on file    Attends meetings of clubs or organizations: Not on file    Relationship status: Not on file  Other Topics Concern  . Not on file  Social History Narrative   Married. 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    ROS:   Please see the history of present illness.    ROS All other systems reviewed and are negative.   PHYSICAL EXAM:   VS:  BP 138/82   Pulse 69   Ht _0  (1.651 m)   Wt 119 lb 12.8 oz (54.3 kg)   SpO2 94%   BMI 19.94 kg/m    GEN: Thin frail female in no acute distress  HEENT: normal  Neck: no JVD, carotid bruits, or masses Cardiac: RRR; no  murmurs, rubs, or gallops,no edema  Respiratory:  clear to auscultation bilaterally, normal work of breathing GI: soft, nontender, nondistended, + BS MS: no deformity or atrophy  Skin: warm and dry, no rash Neuro:  Alert and Oriented x 3, Strength and sensation are intact Psych: euthymic mood, full affect  Wt Readings from Last 3 Encounters:  10/03/17 119 lb 12.8 oz (54.3 kg)  10/02/17 119 lb (54 kg)  09/27/17 123 lb 4 oz (55.9 kg)      Studies/Labs Reviewed:   EKG:  EKG is not ordered today.    Recent Labs: 04/11/2017: TSH 0.44 09/16/2017: B Natriuretic Peptide 310.4 09/19/2017: ALT 20 09/22/2017: Magnesium 2.1 09/28/2017: BUN 11; Creat 0.84; Hemoglobin 13.1; Platelets 281; Potassium 3.6; Sodium 138   Lipid Panel    Component Value Date/Time   CHOL 148 04/11/2017 1113   TRIG 103.0 04/11/2017 1113   HDL 50.80 04/11/2017 1113   CHOLHDL 3 04/11/2017 1113   VLDL 20.6 04/11/2017 1113   LDLCALC 76 04/11/2017 1113    Additional studies/ records that were reviewed today include:   Echocardiogram: 09/18/17 Study Conclusions  - Left ventricle: The cavity size was normal. There was moderate   septal hypertrophy with otherwise mild concentric hypertrophy.   Systolic function was normal. The estimated ejection fraction was   in the range of 60% to 65%. There was dynamic obstruction at   rest, with a peak velocity of 189 cm/sec and a peak gradient of   14 mm Hg. Wall motion was normal; there were no regional wall   motion abnormalities. Doppler parameters are consistent with   abnormal left ventricular relaxation (grade 1  diastolic   dysfunction). - Aortic valve: Transvalvular velocity was within the normal range.   There was no stenosis. There was mild regurgitation. - Mitral valve: Transvalvular velocity was within the normal range.   There was no evidence for stenosis. There was trivial   regurgitation. - Right ventricle: The cavity size was normal. Wall thickness was   normal. Systolic function was normal. - Atrial septum: No defect or patent foramen ovale was identified   by color flow Doppler. - Tricuspid valve: There was mild regurgitation. - Pulmonary arteries: Systolic pressure was severely increased. PA   peak pressure: 69 mm Hg (S).   RIGHT HEART CATH  09/20/17  Conclusion   1. Normal right and left heart filling pressures.  2. Moderate pulmonary arterial hypertension with very high PVR.  3. Low cardiac output.    Significant PAH. This may be group 3 PH in setting of COPD given history.  V/Q scan was not suggestive of chronic PEs.  She needs PFTs to see how severe COPD is.  If pulmonary hypertension looks out of proportion to severity of COPD, would consider treatment.    I am not sure that cardiac output as obtained is correct.  Oxygen saturation was up and down in the cath lab, we used a pulse ox saturation.  It is possible saturation was truly lower and CO higher.    ASSESSMENT & PLAN:    1. Pulmonary hypertension - Right heart cath with PA pressure 57/16 mmHg and PCWP only 5 mmHg.  Will refer to Dr. Aundra Dubin as recommended  2. Chronic diastolic CHF -Euvolemic. Advised to cut back on salt. Continue BB and ARB  3. Abdominal pain - Resolved  4. COPD - Per Dr. Mertha Finders  5. HTN - BP stable on current medications    Medication Adjustments/Labs and Tests Ordered: Current medicines  are reviewed at length with the patient today.  Concerns regarding medicines are outlined above.  Medication changes, Labs and Tests ordered today are listed in the Patient Instructions below. Patient  Instructions  Medication Instructions:  Your physician recommends that you continue on your current medications as directed. Please refer to the Current Medication list given to you today.  Labwork: NONE  Testing/Procedures: NONE  Follow-Up: Your physician wants you to follow-up in: 6 months with Dr. Johnsie Cancel. You will receive a reminder letter in the mail two months in advance. If you don't receive a letter, please call our office to schedule the follow-up appointment.  You have been referred to Dr. Aundra Dubin for pulmonary hypertension.    If you need a refill on your cardiac medications before your next appointment, please call your pharmacy.       Jarrett Soho, [place]  10/03/2017 9:05 AM    [address] [place], [place], [place]  [address] Phone: [phone]; Fax: [phone]

## 2017-10-03 NOTE — Patient Instructions (Signed)
Medication Instructions:  Your physician recommends that you continue on your current medications as directed. Please refer to the Current Medication list given to you today.  Labwork: NONE  Testing/Procedures: NONE  Follow-Up: Your physician wants you to follow-up in: 6 months with Dr. Johnsie Cancel. You will receive a reminder letter in the mail two months in advance. If you don't receive a letter, please call our office to schedule the follow-up appointment.  You have been referred to Dr. Aundra Dubin for pulmonary hypertension.    If you need a refill on your cardiac medications before your next appointment, please call your pharmacy.

## 2017-10-05 ENCOUNTER — Encounter: Payer: Self-pay | Admitting: Internal Medicine

## 2017-10-05 ENCOUNTER — Other Ambulatory Visit: Payer: Self-pay | Admitting: *Deleted

## 2017-10-05 ENCOUNTER — Encounter: Payer: Self-pay | Admitting: *Deleted

## 2017-10-05 ENCOUNTER — Ambulatory Visit (INDEPENDENT_AMBULATORY_CARE_PROVIDER_SITE_OTHER): Payer: Medicare HMO | Admitting: Internal Medicine

## 2017-10-05 ENCOUNTER — Telehealth: Payer: Self-pay

## 2017-10-05 VITALS — BP 120/70 | HR 79 | Ht 65.0 in | Wt 120.0 lb

## 2017-10-05 DIAGNOSIS — J9611 Chronic respiratory failure with hypoxia: Secondary | ICD-10-CM | POA: Diagnosis not present

## 2017-10-05 DIAGNOSIS — J449 Chronic obstructive pulmonary disease, unspecified: Secondary | ICD-10-CM | POA: Diagnosis not present

## 2017-10-05 MED ORDER — TIOTROPIUM BROMIDE-OLODATEROL 2.5-2.5 MCG/ACT IN AERS: 2.0000 | INHALATION_SPRAY | Freq: Every day | RESPIRATORY_TRACT | 11 refills | Status: DC

## 2017-10-05 MED ORDER — TIOTROPIUM BROMIDE-OLODATEROL 2.5-2.5 MCG/ACT IN AERS: 2.0000 | INHALATION_SPRAY | Freq: Every day | RESPIRATORY_TRACT | 0 refills | Status: DC

## 2017-10-05 NOTE — Patient Outreach (Signed)
[facility]) Care Management  10/05/2017  Denise [name] 1945/07/31 [id number]   Notified that member triggered red for COPD EMMI dashboard for using rescue inhaler multiple times in a day.  Call placed to member, 2nd attempt, no answer.  HIPAA compliant voice message left.  Will await call back.     Update:  Call received back from member, state she does not have much time to talk as she is preparing to go to MD visit.  This care manager introduced self and purpose of call, [facility] services explained.  State she has had to use her inhaler but feel she has improved since discharge.  Denies any shortness of breath at this time, but does experience it intermittently.  Report compliance with medications, denies questions.  Denies any urgent concerns, agrees to home visit within the next 2 weeks.  [facility] CM Care Plan Problem One     Most Recent Value  Care Plan Problem One  Risk for readmission related to COPD management as evidenced by recent hospitalization  Role Documenting the Problem One  Care Management [place] for Problem One  Active  [facility] Long Term Goal   Member will be able to verbalize understanding of yellow zone of COPD action plan within the next 2 months  [facility] Long Term Goal Start Date  10/05/17  Interventions for Problem One Long Term Goal  Discussed with member the importance of following discharge instructions, including follow up appointments, medications, diet, and COPD action plan, to decrease the risk of readmission  [facility] CM Short Term Goal #1   Member will keep and attend appointments with pulmonology over the next 2 weeks  [facility] CM Short Term Goal #1 Start Date  10/05/17  Interventions for Short Term Goal #1  Confirmed member attended appointment this afternoon, next appointment scheduled for 10/16.  Confirmed member has transportation  [facility] CM Short Term Goal #2   Member will take medications as prescribed over the next 4 weeks  [facility] CM Short  Term Goal #2 Start Date  10/05/17  Interventions for Short Term Goal #2  Medications reviewed with member, will review in person during home visit.     Valente David, [place], MSN [place] [phone]

## 2017-10-05 NOTE — Progress Notes (Signed)
Subjective:     Patient ID: Denise [name], female   DOB: January 06, 1945,    MRN: [id number]    Brief patient profile:  69 yobf  Quit smoking 1989 with doe then dx'd with copd in [place] in mid 1990s where lived x 25 years and started on spiriva around 2005-10 and helped some and since 2017 back in [place] and breathing generally better in [place] but not doing as many steps and changed from spiriva to symb but not taking it regulary and no need for rescue inhaler referred to pulmonary clinic 09/11/2016 by Dr   Malena Edman.    History of Present Illness  09/11/2016 1st [facility] office visit/ [facility]  Re GOLD II copd  Chief Complaint  Patient presents with  . pulmonary consult    Referred by Dr. Carlis Abbott for COPD. patient states she is out of breath on exertion, denies chest pain and chest tightness  doe = MMRC2 = can't walk a nl pace on a flat grade s sob but does fine slow and flat eg shopping  rec Plan A = Automatic =  symbicort 160 up to 2 every 12 hours Work on inhaler technique:  Plan B = Backup Only use your albuterol (ventolin) as a rescue medication Return in 3 months     10/02/2017 extended post hosp f/u ov/Wert re: copd /clearance for parathyroid surgery Chief Complaint  Patient presents with  . Hospitalization Follow-up    admitted to hospital 09/16/17 until 09/24/17. She was sent home with o2 but not using it much. She states she has occ SOB with exertion. She is using her ventolin inhaler 4 x daily on average.   Dyspnea:  MMRC3 = can't walk 100 yards even at a slow pace at a flat grade s stopping due to sob   Cough: not much at al now  Sleeping: bed flat/ 2 pillows  SABA use: 4 x daily - was less while on symb 160 vs advair 250 though hfa quite poor (see copd a/p)  02:  1-2lpm   rec Plan A = Automatic = symbicort 160 Take 2 puffs first thing in am and then another 2 puffs about 12 hours later.  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 Follow-up    She states she is needing letter releasing her to return to work.  She states her breathing has improved.   Dyspnea:  mb and back flat better since symbicort 160 though hfa quite poor  Cough: none Sleeping: bed flat/ 2 pillows SABA use: rarely since symbicort 02: have it but not using x sometimes hs     No obvious day to day or daytime variability or assoc excess/ purulent sputum or mucus plugs or hemoptysis or cp or chest tightness, subjective wheeze or overt sinus or hb symptoms.   Sleeping as above  without nocturnal  or early am exacerbation  of respiratory  c/o's or need for noct saba. Also denies any obvious fluctuation of symptoms with weather or environmental changes or other aggravating or alleviating factors except as outlined above   No unusual exposure hx or h/o childhood pna/ asthma or knowledge of premature birth.  Current Allergies, Complete  Past Medical History, Past Surgical History, Family History, and Social History were reviewed in [employer] record.  ROS  The following are not active complaints unless bolded Hoarseness, sore throat, dysphagia, dental problems, itching, sneezing,  nasal congestion or discharge of excess mucus or purulent secretions, ear ache,   fever, chills, sweats, unintended wt loss improving  or wt gain, classically pleuritic or exertional cp,  orthopnea pnd or arm/hand swelling  or leg swelling, presyncope, palpitations, abdominal pain, anorexia, nausea, vomiting, diarrhea  or change in bowel habits or change in bladder habits, change in stools or change in urine, dysuria, hematuria,  rash, arthralgias, visual complaints, headache, numbness, weakness or ataxia or problems with walking or  coordination,  change in mood or  memory.                 Objective:   Physical Exam    10/05/2017        120   10/02/2017        119   07/27/16 144 lb 12.8 oz (65.7 kg)  06/02/16 136 lb 6.4 oz (61.9 kg)  11/22/15 156 lb 12.8 oz (71.1 kg)    . Vital signs reviewed - Note on arrival 02 sats  91% on RA       HEENT: nl dentition / oropharynx. Nl external ear canals without cough reflex -  Mild bilateral non-specific turbinate edema     NECK :  without JVD/Nodes/TM/ nl carotid upstrokes bilaterally   LUNGS: no acc muscle use,  Mod barrel  contour chest wall with bilateral  Distant bs s audible wheeze and  without cough on insp or exp maneuver and mod  Hyperresonant  to  percussion bilaterally     CV:  RRR  no s3 or murmur or increase in P2, and no edema   ABD:  soft and nontender with pos mid insp Hoover's  in the supine position. No bruits or organomegaly appreciated, bowel sounds nl  MS:   Nl gait/  ext warm without deformities, calf tenderness, cyanosis or clubbing No obvious joint restrictions   SKIN: warm and dry without lesions    NEURO:  alert, approp, nl sensorium with  no motor or cerebellar deficits apparent.                       Assessment:

## 2017-10-05 NOTE — Patient Instructions (Signed)
Plan A = Automatic = Stiolto 2 pffs each am only and no more Symbicort   Work on inhaler technique:  relax and gently blow all the way out then take a nice smooth deep breath back in, triggering the inhaler at same time you start breathing in.  Hold for up to 5 seconds if you can.  Rinse and gargle with water when done      Plan B = Backup Only use your albuterol as a rescue medication to be used if you can't catch your breath by resting or doing a relaxed purse lip breathing pattern.  - The less you use it, the better it will work when you need it. - Ok to use the inhaler up to 2 puffs  every 4 hours if you must but call for appointment if use goes up over your usual need - Don't leave home without it !!  (think of it like the spare tire for your car)    Ok to return to light duty only    Keep previous appt

## 2017-10-05 NOTE — Telephone Encounter (Signed)
Spoken to patient and inform her that [name] is out of the office until next week.   Patient was seen for hospital  follow up on 09/27/2017. Patient also saw a couple specialist after she was seen with [name].

## 2017-10-05 NOTE — Telephone Encounter (Signed)
Copied from [place] [id number]. Topic: General - Other >> Oct 05, 2017  9:31 AM Yvette Rack wrote: Reason for CRM: Pt states she is ready to go back to work and she needs a letter for work stating she is able to return to work. Cb# [phone]

## 2017-10-06 ENCOUNTER — Encounter: Payer: Self-pay | Admitting: Internal Medicine

## 2017-10-06 NOTE — Assessment & Plan Note (Signed)
Encourage to use 02 more consistently but she is resistive

## 2017-10-06 NOTE — Assessment & Plan Note (Signed)
Quit smoking 1989 - Spirometry 09/11/2016  FEV1 1.03 (52%)  Ratio 54 mild curvature, on no rx - 09/11/2016  After extensive coaching HFA effectiveness =    50% from baseline 10% > continue  symb 160 2bid but consider change to bevespi or stiolto  - 10/02/2017  After extensive coaching inhaler device,  effectiveness =    50% from a baseline near 0 > rechallenge with symbicort 160 x 2 bid x 2 weeks then re-group   - 10/05/2017  After extensive coaching inhaler device,  effectiveness =    75% with smi > try stiolto x 2pffs x 2 week sample as much better with smi than hfa and Pt is Group B in terms of symptom/risk and laba/lama therefore appropriate rx at this point.   - ok to return to light duty and return for pfts as planned prior to clearing for parathyroid surgery     I had an extended discussion with the patient reviewing all relevant studies completed to date and  lasting 10 min of a  15 minute visit    See device teaching which extended face to face time for this visit.  Each maintenance medication was reviewed in detail including emphasizing most importantly the difference between maintenance and prns and under what circumstances the prns are to be triggered using an action plan format that is not reflected in the computer generated alphabetically organized AVS which I have not found useful in most complex patients, especially with respiratory illnesses  Please see AVS for specific instructions unique to this visit that I personally wrote and verbalized to the the pt in detail and then reviewed with pt  by my nurse highlighting any  changes in therapy recommended at today's visit to their plan of care.

## 2017-10-09 NOTE — Telephone Encounter (Signed)
It looks like a letter was completed by the pulmonologist on 10/05/17. Look under letters tab.

## 2017-10-10 ENCOUNTER — Other Ambulatory Visit: Payer: Self-pay | Admitting: *Deleted

## 2017-10-10 NOTE — Patient Outreach (Signed)
[facility]) Care Management  10/10/2017  Enid Derry A. Burdell [birth year] [id number]   Notified that member triggered red for EMMI COPD dashboard for using rescue inhaler multiple times in the past 24 hours, not attending follow up appointment, and not being able to obtain medications.  Call placed to member, no answer.  HIPAA compliant voice message left.  Home visit scheduled for next week, will follow up within the next 4 business days to confirm visit.  Valente David, [place], MSN [place] [phone]

## 2017-10-15 NOTE — Research (Signed)
[place] Informed Consent   Subject Name: Denise [name]  Subject met inclusion and exclusion criteria.  The informed consent form, study requirements and expectations were reviewed with the subject and questions and concerns were addressed prior to the signing of the consent form.  The subject verbalized understanding of the trail requirements.  The subject agreed to participate in the [facility] trial and signed the informed consent.  The informed consent was obtained prior to performance of any protocol-specific procedures for the subject.  A copy of the signed informed consent was given to the subject and a copy was placed in the subject's medical record.  Denise [name] 10/15/2017, 12:09 PM

## 2017-10-16 ENCOUNTER — Ambulatory Visit: Payer: Medicare HMO | Admitting: Internal Medicine

## 2017-10-17 ENCOUNTER — Encounter: Payer: Self-pay | Admitting: Internal Medicine

## 2017-10-17 ENCOUNTER — Ambulatory Visit: Payer: Medicare HMO | Admitting: Internal Medicine

## 2017-10-17 VITALS — BP 116/70 | HR 80 | Ht 65.0 in | Wt 114.6 lb

## 2017-10-17 DIAGNOSIS — I272 Pulmonary hypertension, unspecified: Secondary | ICD-10-CM | POA: Diagnosis not present

## 2017-10-17 DIAGNOSIS — R1013 Epigastric pain: Secondary | ICD-10-CM

## 2017-10-17 DIAGNOSIS — J449 Chronic obstructive pulmonary disease, unspecified: Secondary | ICD-10-CM | POA: Diagnosis not present

## 2017-10-17 DIAGNOSIS — J9611 Chronic respiratory failure with hypoxia: Secondary | ICD-10-CM

## 2017-10-17 MED ORDER — TIOTROPIUM BROMIDE-OLODATEROL 2.5-2.5 MCG/ACT IN AERS: 2.0000 | INHALATION_SPRAY | Freq: Every day | RESPIRATORY_TRACT | 0 refills | Status: DC

## 2017-10-17 NOTE — Patient Instructions (Addendum)
Continue stiolto 2 pffs each am   02 2lpm at bedtime and 2lpm with any activity   Please see patient coordinator before you leave today  to schedule GI eval by Carlean Purl for abd pain / wt loss   Please schedule a follow up office visit in 2 weeks with full pfts and all active meds / inahlers in hand

## 2017-10-17 NOTE — Progress Notes (Signed)
Subjective:     Patient ID: Denise [name], female   DOB: Oct 20, 1945,    MRN: [id number]    Brief patient profile:  36 yobf  Quit smoking 1989 with doe then dx'd with copd in [place] in mid 1990s where lived x 25 years and started on spiriva around 2005-10 and helped some and since 2017 back in [place] and breathing generally better in [place] but not doing as many steps and changed from spiriva to symb but not taking it regulary and no need for rescue inhaler referred to pulmonary clinic 09/11/2016 by Dr   Malena Edman.    History of Present Illness  09/11/2016 1st [facility] office visit/ Wert  Re GOLD II copd  Chief Complaint  Patient presents with  . pulmonary consult    Referred by Dr. Carlis Abbott for COPD. patient states she is out of breath on exertion, denies chest pain and chest tightness  doe = MMRC2 = can't walk a nl pace on a flat grade s sob but does fine slow and flat eg shopping  rec Plan A = Automatic =  symbicort 160 up to 2 every 12 hours Work on inhaler technique:  Plan B = Backup Only use your albuterol (ventolin) as a rescue medication Return in 3 months     10/02/2017 extended post hosp f/u ov/Wert re: copd /clearance for parathyroid surgery Chief Complaint  Patient presents with  . Hospitalization Follow-up    admitted to hospital 09/16/17 until 09/24/17. She was sent home with o2 but not using it much. She states she has occ SOB with exertion. She is using her ventolin inhaler 4 x daily on average.   Dyspnea:  MMRC3 = can't walk 100 yards even at a slow pace at a flat grade s stopping due to sob   Cough: not much at al now  Sleeping: bed flat/ 2 pillows  SABA use: 4 x daily - was less while on symb 160 vs advair 250 though hfa quite poor (see copd a/p)  02:  1-2lpm   rec Plan A = Automatic = symbicort 160 Take 2 puffs first thing in am and then another 2 puffs about 12 hours later.  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 Follow-up    She states she is needing letter releasing her to return to work.  She states her breathing has improved.   Dyspnea:  mb and back flat better since symbicort 160 though hfa quite poor  Cough: none Sleeping: bed flat/ 2 pillows SABA use: rarely since symbicort 02: have it but not using x sometimes hs   rec Plan A = Automatic = Stiolto 2 pffs each am only and no more Symbicort  Work on inhaler technique:  relax and gently blow all the way out then take a nice smooth deep breath back in, triggering the inhaler at same time you start breathing in.  Hold for up to 5 seconds if you can.  Rinse and gargle with water when done Plan B = Backup Only use your albuterol as a rescue medication to be used if you can't catch your breath by resting or doing a relaxed purse lip breathing pattern.  -  The less you use it, the better it will work when you need it. - Ok to use the inhaler up to 2 puffs  every 4 hours if you must but call for appointment if use goes up over your usual need - Don't leave home without it !!  (think of it like the spare tire for your car)  Ok to return to light duty only  Keep previous appt      10/17/2017  f/u ov/Wert re:  GOLD II copd / cor pulmonale - not using 02 as rec  Chief Complaint  Patient presents with  . Follow-up    Breathing is about the same. She has good days and bad days. She is using her albuterol inhaler 2 x daily on average.   Dyspnea:  mb and back with 02 is easier than 02 but still not using it Cough: minimal Sleeping: bed flat/ 2pillows SABA use: sev times a day  02:  Using 2lpm at bedtime and sometimes with activty     No obvious day to day or daytime variability or assoc excess/ purulent sputum or mucus plugs or  hemoptysis or cp or chest tightness, subjective wheeze or overt sinus or hb symptoms.   Sleeping as above  without nocturnal  or early am exacerbation  of respiratory  c/o's or need for noct saba. Also denies any obvious fluctuation of symptoms with weather or environmental changes or other aggravating or alleviating factors except as outlined above   No unusual exposure hx or h/o childhood pna/ asthma or knowledge of premature birth.  Current Allergies, Complete Past Medical History, Past Surgical History, Family History, and Social History were reviewed in [employer] record.  ROS  The following are not active complaints unless bolded Hoarseness, sore throat, dysphagia, dental problems, itching, sneezing,  nasal congestion or discharge of excess mucus or purulent secretions, ear ache,   fever, chills, sweats, unintended wt loss or wt gain, classically pleuritic or exertional cp,  orthopnea pnd or arm/hand swelling  or leg swelling, presyncope, palpitations, abdominal pain relieved by bm, anorexia, nausea, vomiting, diarrhea  or change in bowel habits or change in bladder habits, change in stools or change in urine, dysuria, hematuria,  rash, arthralgias, visual complaints, headache, numbness, weakness or ataxia or problems with walking or coordination,  change in mood= anxiety and depression or  memory.        Current Meds  Medication Sig  . acetaminophen (TYLENOL) 325 MG tablet Take 2 tablets (650 mg total) by mouth every 6 (six) hours as needed for mild pain or moderate pain (or Fever >/= 101).  [name] albuterol (PROVENTIL HFA;VENTOLIN HFA) 108 (90 Base) MCG/ACT inhaler Inhale 1-2 puffs into the lungs every 4 (four) hours as needed for wheezing or shortness of breath.  [name] aspirin EC 81 MG tablet Take 81 mg by mouth daily.  . famotidine (PEPCID) 40 MG tablet Take 1 tablet (40 mg total) by mouth 2 (two) times daily.  [name] losartan (COZAAR) 100 MG tablet Take 1 tablet by mouth once  daily for blood pressure. (Patient taking differently: Take 100 mg by mouth daily. for blood pressure.)  . Melatonin 5 MG TABS Take 5 mg by mouth at bedtime as needed (sleep).  . metoprolol tartrate (LOPRESSOR) 25 MG tablet Take 1 tablet (25 mg total) by mouth 2 (two) times daily.  . mirtazapine (REMERON) 15 MG tablet Take 1 tablet (15 mg total) by mouth at bedtime. for depression and appetite.  [name]  Multiple Vitamin (MULTIVITAMIN WITH MINERALS) TABS tablet Take 1 tablet by mouth daily.  . OXYGEN 2lpm with exertion as needed  . polyethylene glycol (MIRALAX) packet Take 17 g by mouth 2 (two) times daily.  [name] senna (SENOKOT) 8.6 MG TABS tablet Take 2 tablets (17.2 mg total) by mouth at bedtime as needed for mild constipation or moderate constipation.  . sertraline (ZOLOFT) 25 MG tablet Take 1 tablet by mouth once daily for depression.  . Tiotropium Bromide-Olodaterol (STIOLTO RESPIMAT) 2.5-2.5 MCG/ACT AERS Inhale 2 puffs into the lungs daily.                 Objective:   Physical Exam    10/17/2017        114  10/05/2017        120   10/02/2017        119   07/27/16 144 lb 12.8 oz (65.7 kg)  06/02/16 136 lb 6.4 oz (61.9 kg)  11/22/15 156 lb 12.8 oz (71.1 kg)    Vital signs reviewed - Note on arrival 02 sats  91% on RA     HEENT: nl dentition / oropharynx. Nl external ear canals without cough reflex -  Mild bilateral non-specific turbinate edema     NECK :  without JVD/Nodes/TM/ nl carotid upstrokes bilaterally   LUNGS: no acc muscle use,  Mild barrel  contour chest wall with bilateral  Distant bs s audible wheeze and  without cough on insp or exp maneuver and mild  Hyperresonant  to  percussion bilaterally     CV:  RRR  no s3 or murmur or increase in P2, and no edema   ABD:  Mildly distended but soft and nontender with pos late insp Hoover's  in the supine position. No bruits or organomegaly appreciated, bowel sounds nl  MS:   Nl gait/  ext warm without deformities, calf  tenderness, cyanosis or clubbing No obvious joint restrictions   SKIN: warm and dry without lesions    NEURO:  alert, approp, nl sensorium with  no motor or cerebellar deficits apparent.         Assessment:

## 2017-10-18 ENCOUNTER — Other Ambulatory Visit: Payer: Self-pay | Admitting: *Deleted

## 2017-10-18 NOTE — Patient Outreach (Signed)
[facility]) Care Management  10/18/2017  Denise [name] 30-Dec-1945 [id number]   Member scheduled for home visit this afternoon.  Call placed to member on listed home phone, no answer, unable to leave a message.  Call placed to listed mobile number, no answer, HIPAA compliant voice message left.  This is 2nd consecutive unsuccessful outreach attempt, outreach letter sent.  Will follow up within the next 4 business days.  Valente David, [place], MSN [place] [phone]

## 2017-10-19 ENCOUNTER — Encounter: Payer: Self-pay | Admitting: Internal Medicine

## 2017-10-19 NOTE — Assessment & Plan Note (Signed)
See [place] 09/20/17 with nl pressures but low CO ? Accuracy   C/w longstanding cor pulmonale/ WHO III > rx is 02

## 2017-10-19 NOTE — Assessment & Plan Note (Addendum)
Quit smoking 1989 - Spirometry 09/11/2016  FEV1 1.03 (52%)  Ratio 54 mild curvature, on no rx - 09/11/2016  After extensive coaching HFA effectiveness =    50% from baseline 10% > continue  symb 160 2bid but consider change to bevespi or stiolto  - 10/02/2017  After extensive coaching inhaler device,  effectiveness =    50% from a baseline near 0 > rechallenge with symbicort 160 x 2 bid x 2 weeks then re-group  - 10/05/2017    > try stiolto x 2pffs daily    - The proper method of use, as well as anticipated side effects, of a metered-dose inhaler are discussed and demonstrated to the patient.   No change in rx needed

## 2017-10-19 NOTE — Assessment & Plan Note (Signed)
Reviewed again need to wear 02 with any activity at 2lpm  Though no need at rest   I had an extended discussion with the patient reviewing all relevant studies completed to date and  lasting 15 to 20 minutes of a 25 minute visit    See device teaching which extended face to face time for this visit.  Each maintenance medication was reviewed in detail including emphasizing most importantly the difference between maintenance and prns and under what circumstances the prns are to be triggered using an action plan format that is not reflected in the computer generated alphabetically organized AVS which I have not found useful in most complex patients, especially with respiratory illnesses  Please see AVS for specific instructions unique to this visit that I personally wrote and verbalized to the the pt in detail and then reviewed with pt  by my nurse highlighting any  changes in therapy recommended at today's visit to their plan of care.

## 2017-10-22 ENCOUNTER — Ambulatory Visit: Payer: Medicare HMO | Admitting: Gastroenterology

## 2017-10-25 ENCOUNTER — Other Ambulatory Visit: Payer: Self-pay | Admitting: *Deleted

## 2017-10-25 DIAGNOSIS — J449 Chronic obstructive pulmonary disease, unspecified: Secondary | ICD-10-CM | POA: Diagnosis not present

## 2017-10-25 DIAGNOSIS — I272 Pulmonary hypertension, unspecified: Secondary | ICD-10-CM | POA: Diagnosis not present

## 2017-10-25 DIAGNOSIS — J439 Emphysema, unspecified: Secondary | ICD-10-CM | POA: Diagnosis not present

## 2017-10-25 NOTE — Patient Outreach (Signed)
[facility]) Care Management  10/25/2017  Enid Derry A. Younce [birth year] [id number]   3rd unsuccessful outreach attempt.  Will close case, will notify primary MD and member of case closure due to inability to maintain contact.  Valente David, [place], MSN [facility] [phone]

## 2017-10-30 ENCOUNTER — Ambulatory Visit (INDEPENDENT_AMBULATORY_CARE_PROVIDER_SITE_OTHER): Payer: Medicare HMO | Admitting: Physician Assistant

## 2017-10-30 ENCOUNTER — Telehealth: Payer: Self-pay | Admitting: *Deleted

## 2017-10-30 ENCOUNTER — Telehealth (HOSPITAL_COMMUNITY): Payer: Self-pay | Admitting: Vascular Surgery

## 2017-10-30 ENCOUNTER — Encounter: Payer: Self-pay | Admitting: Physician Assistant

## 2017-10-30 VITALS — BP 118/70 | HR 82 | Ht 65.0 in | Wt 117.0 lb

## 2017-10-30 DIAGNOSIS — Z85038 Personal history of other malignant neoplasm of large intestine: Secondary | ICD-10-CM | POA: Diagnosis not present

## 2017-10-30 DIAGNOSIS — Z860101 Personal history of adenomatous and serrated colon polyps: Secondary | ICD-10-CM

## 2017-10-30 DIAGNOSIS — Z86010 Personal history of colonic polyps: Secondary | ICD-10-CM

## 2017-10-30 DIAGNOSIS — K2970 Gastritis, unspecified, without bleeding: Secondary | ICD-10-CM | POA: Diagnosis not present

## 2017-10-30 DIAGNOSIS — R634 Abnormal weight loss: Secondary | ICD-10-CM | POA: Diagnosis not present

## 2017-10-30 DIAGNOSIS — E210 Primary hyperparathyroidism: Secondary | ICD-10-CM | POA: Diagnosis not present

## 2017-10-30 DIAGNOSIS — Z8719 Personal history of other diseases of the digestive system: Secondary | ICD-10-CM

## 2017-10-30 DIAGNOSIS — K2990 Gastroduodenitis, unspecified, without bleeding: Secondary | ICD-10-CM | POA: Diagnosis not present

## 2017-10-30 NOTE — Telephone Encounter (Signed)
Left pt message giving new pt appt w/ DB per [facility] 11/8 @ 9:40a. Asked pt to call back to confirm appt

## 2017-10-30 NOTE — Progress Notes (Signed)
Subjective:    Patient ID: Enid Derry A. Forsman, female    DOB: 1945/11/24, 72 y.o.   MRN: [id number]  HPI Yaritzy is a pleasant 72 year old African-American female known to Dr. Carlean Purl, who comes in today for follow-up after hospitalization in mid September with a small bowel obstruction. Patient has multiple comorbidities including COPD, chronic respiratory failure for which she is generally on oxygen, pulmonary hypertension, hypertension, hyperparathyroidism.  She has history of adenomatous colon polyps and had colon cancer in 1988 with subsequent resection. She was hospitalized on 09/17/2017 with abdominal pain [place] and initially there was concern for possible choledocholithiasis.  She had had some pericholecystic edema was later determined to be secondary to edema from right heart failure and patient with severe pulmonary hypertension. She was seen and evaluated by cardiology.  She also had multiple dilated small bowel loops on CT scan on 09/22/2017 was consistent with a partial distal small bowel obstruction with no definite transition zone.  She was treated with enemas, and small bowel obstructive picture resolved over the next 24 hours..  She was seen by surgery/Dr. Rosendo Gros also because of indication for upcoming parathyroid surgery. Was discharged on 09/24/2017. She has been seen several times by Dr. Melvyn Novas since for COPD/respiratory failure with exacerbation.  She supposed to be on oxygen 2 L when she is active.  She is also seen cardiology and is awaiting an appointment with Dr. Algernon Huxley apparently for further evaluation of the pulmonary hypertension in order to get her cleared for surgery.  She is concerned because she says she continues to gradually lose weight.  Her weight is down from 128 in June 2000 19-1 17 today.  She says her appetite is poor but she is trying to eat.  She is not having any significant abdominal pain.  She says her stools have been loose since she was discharged from the  hospital generally having 1 or 2 bowel movements per day.  Review of Systems Pertinent positive and negative review of systems were noted in the above HPI section.  All other review of systems was otherwise negative.  Outpatient Encounter Medications as of 10/30/2017  Medication Sig  . acetaminophen (TYLENOL) 325 MG tablet Take 2 tablets (650 mg total) by mouth every 6 (six) hours as needed for mild pain or moderate pain (or Fever >/= 101).  [name] albuterol (PROVENTIL HFA;VENTOLIN HFA) 108 (90 Base) MCG/ACT inhaler Inhale 1-2 puffs into the lungs every 4 (four) hours as needed for wheezing or shortness of breath.  . famotidine (PEPCID) 40 MG tablet Take 1 tablet (40 mg total) by mouth 2 (two) times daily.  [name] losartan (COZAAR) 100 MG tablet Take 1 tablet by mouth once daily for blood pressure. (Patient taking differently: Take 100 mg by mouth daily. for blood pressure.)  . Melatonin 5 MG TABS Take 5 mg by mouth at bedtime as needed (sleep).  . metoprolol tartrate (LOPRESSOR) 25 MG tablet Take 1 tablet (25 mg total) by mouth 2 (two) times daily.  . mirtazapine (REMERON) 15 MG tablet Take 1 tablet (15 mg total) by mouth at bedtime. for depression and appetite.  . OXYGEN 2lpm with exertion as needed  . senna (SENOKOT) 8.6 MG TABS tablet Take 2 tablets (17.2 mg total) by mouth at bedtime as needed for mild constipation or moderate constipation.  . Tiotropium Bromide-Olodaterol (STIOLTO RESPIMAT) 2.5-2.5 MCG/ACT AERS Inhale 2 puffs into the lungs daily.  . [DISCONTINUED] aspirin EC 81 MG tablet Take 81 mg by mouth daily.  . [  DISCONTINUED] Multiple Vitamin (MULTIVITAMIN WITH MINERALS) TABS tablet Take 1 tablet by mouth daily.  . [DISCONTINUED] polyethylene glycol (MIRALAX) packet Take 17 g by mouth 2 (two) times daily.  . [DISCONTINUED] sertraline (ZOLOFT) 25 MG tablet Take 1 tablet by mouth once daily for depression.   No facility-administered encounter medications on file as of 10/30/2017.    No Known  Allergies Patient Active Problem List   Diagnosis Date Noted  . Chronic respiratory failure with hypoxia ([place]) 10/02/2017  . Hematuria 09/27/2017  . SBO (small bowel obstruction) ([place])   . Pulmonary hypertension ([name]) c/w cor pulmonale   . Acute respiratory failure with hypoxia ([place]) 09/17/2017  . Epigastric pain 09/17/2017  . Protein-calorie malnutrition, severe 09/17/2017  . Abnormal CT scan, gastrointestinal tract   . Constipation   . Other ascites   . Hyperparathyroidism, primary ([place]) 07/27/2016  . Loss of appetite 06/02/2016  . COPD GOLD  II   11/22/2015  . Vitamin D deficiency 11/22/2015  . Anxiety and depression 11/22/2015  . Insomnia 11/22/2015  . Altered mental status 11/22/2015  . Hyperlipidemia 11/22/2015  . Essential hypertension 11/22/2015  . History of adenomatous polyp of colon 02/10/2013  . History of colon cancer 06/11/1986   Social History   Socioeconomic History  . Marital status: Married    Spouse name: Not on file  . Number of children: 3  . Years of education: Not on file  . Highest education level: Not on file  Occupational History  . Not on file  Social Needs  . Financial resource strain: Not on file  . Food insecurity:    Worry: Not on file    Inability: Not on file  . Transportation needs:    Medical: Not on file    Non-medical: Not on file  Tobacco Use  . Smoking status: Former Smoker    Packs/day: 1.00    Years: 30.00    Pack years: 30.00    Types: Cigarettes    Last attempt to quit: 1989    Years since quitting: 30.8  . Smokeless tobacco: Never Used  Substance and Sexual Activity  . Alcohol use: No  . Drug use: No  . Sexual activity: Yes    Partners: Male    Birth control/protection: Post-menopausal, Surgical  Lifestyle  . Physical activity:    Days per week: Not on file    Minutes per session: Not on file  . Stress: Not on file  Relationships  . Social connections:    Talks on phone: Not on file    Gets together: Not on  file    Attends religious service: Not on file    Active member of club or organization: Not on file    Attends meetings of clubs or organizations: Not on file    Relationship status: Not on file  . Intimate partner violence:    Fear of current or ex partner: Not on file    Emotionally abused: Not on file    Physically abused: Not on file    Forced sexual activity: Not on file  Other Topics Concern  . Not on file  Social History Narrative   Married. 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 Cryer's family history includes Ovarian cancer in her mother.      Objective:    Vitals:   10/30/17 1410  BP: 118/70  Pulse: 82    Physical Exam; well-developed thin frail appearing elderly African-American female in no acute distress, pleasant blood pressure 118/70 pulse 82, height 5 foot 5, weight 117, BMI 19.4.  Weight is down from 128 June 2019 HEENT ;nontraumatic normocephalic EOMI PERRLA sclera anicteric oral mucosa moist, Cardiovascular; regular rate and rhythm with S1-S2, soft murmur, Pulmonary somewhat decreased breath sounds bilaterally, Abdomen ;soft, nondistended, bowel sounds are active, there is no focal tenderness, no guarding or rebound no palpable mass or hepatosplenomegaly, midline incisional scar.  Rectal; exam not done, Extremities; no clubbing cyanosis or edema skin warm dry, Neuro psych; alert and oriented, grossly nonfocal mood and affect appropriate       Assessment & Plan:   #36 72 year old African-American female with recent hospitalization with partial small bowel obstruction likely secondary to adhesions resolved with conservative management and bowel purge #2 severe pulmonary hypertension with pericholecystic fluid secondary to right heart failure noted at the time of hospitalization September 2019. #3 hyperparathyroidism-she continues to have gradual weight loss,  fatigue and decrease in appetite.  She still needs clearance from cardiology from pulmonary hypertension standpoint before she can undergo surgery with Dr. Rosendo Gros  #4 COPD/chronic respiratory failure O2 dependent during the day   #5 hypertension #6.  History of adenomatous colon polyps and remote colon cancer 1988-last colonoscopy February 2015, due for follow-up 2020 #7 gastritis on EGD June 2019's-stable  Plan; Patient will continue Pepcid 40 mg p.o. twice daily. Add trial of fiber supplement/Benefiber once daily to help bulk stools She is encouraged to eat 2 snacks every day, and add a protein shake at least once daily.  Suggested Glucerna or Atkins shakes high-protein  We placed call to cardiology today to help facilitate appointment with Dr. Marigene Ehlers . She will follow-up with GI on an as-needed basis, and will be due for colonoscopy spring 2020 hopefully after she has undergone parathyroidectomy and recovery.  Amy S Esterwood PA-C 10/30/2017   Cc: [name], NP

## 2017-10-30 NOTE — Telephone Encounter (Signed)
LM for the patient that she has an appointment at the Heart and vascular office at [facility], advanced heart failure clinic. Appt date is  Friday 11-09-2017 at 9;40 am. Appointment is with Dr. Glori Bickers.  I told her to call me if she has any questions. I did leave her that office's number which is [phone].

## 2017-10-30 NOTE — Patient Instructions (Addendum)
Add Benefiber - one dose daily.  Try adding protein shake daily. Example :  Atkins high Protein Eat 2 snacks daily.  Follow  Up in February or March 2020 regarding a colonoscopy.    We are getting you an appointment with Dr. Loralie Champagne, Cardiology. Phone: [phone]. We will call you with the appointment.

## 2017-11-01 ENCOUNTER — Encounter: Payer: Self-pay | Admitting: Internal Medicine

## 2017-11-01 ENCOUNTER — Ambulatory Visit (INDEPENDENT_AMBULATORY_CARE_PROVIDER_SITE_OTHER): Payer: Medicare HMO | Admitting: Internal Medicine

## 2017-11-01 VITALS — BP 118/86 | HR 71 | Ht 64.25 in | Wt 118.0 lb

## 2017-11-01 DIAGNOSIS — I10 Essential (primary) hypertension: Secondary | ICD-10-CM | POA: Diagnosis not present

## 2017-11-01 DIAGNOSIS — J449 Chronic obstructive pulmonary disease, unspecified: Secondary | ICD-10-CM

## 2017-11-01 DIAGNOSIS — I272 Pulmonary hypertension, unspecified: Secondary | ICD-10-CM

## 2017-11-01 DIAGNOSIS — J9611 Chronic respiratory failure with hypoxia: Secondary | ICD-10-CM | POA: Diagnosis not present

## 2017-11-01 LAB — PULMONARY FUNCTION TEST
DL/VA % pred: 59 %
DL/VA: 2.88 ml/min/mmHg/L
DLCO COR: 7.49 ml/min/mmHg
DLCO UNC % PRED: 30 %
DLCO UNC: 7.42 ml/min/mmHg
DLCO cor % pred: 30 %
FEF 25-75 PRE: 0.65 L/s
FEF 25-75 Post: 0.62 L/sec
FEF2575-%Change-Post: -4 %
FEF2575-%PRED-PRE: 39 %
FEF2575-%Pred-Post: 37 %
FEV1-%Change-Post: 3 %
FEV1-%PRED-POST: 52 %
FEV1-%PRED-PRE: 50 %
FEV1-POST: 0.95 L
FEV1-Pre: 0.91 L
FEV1FVC-%CHANGE-POST: 11 %
FEV1FVC-%Pred-Pre: 88 %
FEV6-%CHANGE-POST: -6 %
FEV6-%PRED-POST: 55 %
FEV6-%PRED-PRE: 59 %
FEV6-Post: 1.25 L
FEV6-Pre: 1.34 L
FEV6FVC-%CHANGE-POST: 0 %
FEV6FVC-%Pred-Post: 104 %
FEV6FVC-%Pred-Pre: 103 %
FVC-%Change-Post: -7 %
FVC-%Pred-Post: 53 %
FVC-%Pred-Pre: 57 %
FVC-Post: 1.25 L
FVC-Pre: 1.35 L
PRE FEV1/FVC RATIO: 68 %
Post FEV1/FVC ratio: 76 %
Post FEV6/FVC ratio: 100 %
Pre FEV6/FVC Ratio: 99 %
RV % PRED: 220 %
RV: 4.94 L
TLC % PRED: 123 %
TLC: 6.31 L

## 2017-11-01 MED ORDER — LOSARTAN POTASSIUM 100 MG PO TABS: ORAL_TABLET | ORAL | 0 refills | Status: DC

## 2017-11-01 MED ORDER — TIOTROPIUM BROMIDE-OLODATEROL 2.5-2.5 MCG/ACT IN AERS: 2.0000 | INHALATION_SPRAY | Freq: Every day | RESPIRATORY_TRACT | 0 refills | Status: AC

## 2017-11-01 MED ORDER — FAMOTIDINE 40 MG PO TABS: 40.0000 mg | ORAL_TABLET | Freq: Two times a day (BID) | ORAL | 0 refills | Status: DC

## 2017-11-01 MED ORDER — METOPROLOL TARTRATE 25 MG PO TABS: 25.0000 mg | ORAL_TABLET | Freq: Two times a day (BID) | ORAL | 0 refills | Status: DC

## 2017-11-01 NOTE — Progress Notes (Signed)
Subjective:     Patient ID: Denise [name], female   DOB: Oct 20, 1945,    MRN: [id number]    Brief patient profile:  36 yobf  Quit smoking 1989 with doe then dx'd with copd in [place] in mid 1990s where lived x 25 years and started on spiriva around 2005-10 and helped some and since 2017 back in [place] and breathing generally better in [place] but not doing as many steps and changed from spiriva to symb but not taking it regulary and no need for rescue inhaler referred to pulmonary clinic 09/11/2016 by Dr   Malena Edman.    History of Present Illness  09/11/2016 1st  Pulmonary office visit/ Denise [name]  Re GOLD II copd  Chief Complaint  Patient presents with  . pulmonary consult    Referred by Dr. Carlis Abbott for COPD. patient states she is out of breath on exertion, denies chest pain and chest tightness  doe = MMRC2 = can't walk a nl pace on a flat grade s sob but does fine slow and flat eg shopping  rec Plan A = Automatic =  symbicort 160 up to 2 every 12 hours Work on inhaler technique:  Plan B = Backup Only use your albuterol (ventolin) as a rescue medication Return in 3 months     10/02/2017 extended post hosp f/u ov/Denise [name] re: copd /clearance for parathyroid surgery Chief Complaint  Patient presents with  . Hospitalization Follow-up    admitted to hospital 09/16/17 until 09/24/17. She was sent home with o2 but not using it much. She states she has occ SOB with exertion. She is using her ventolin inhaler 4 x daily on average.   Dyspnea:  MMRC3 = can't walk 100 yards even at a slow pace at a flat grade s stopping due to sob   Cough: not much at al now  Sleeping: bed flat/ 2 pillows  SABA use: 4 x daily - was less while on symb 160 vs advair 250 though hfa quite poor (see copd a/p)  02:  1-2lpm   rec Plan A = Automatic = symbicort 160 Take 2 puffs first thing in am and then another 2 puffs about 12 hours later.  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 Follow-up    She states she is needing letter releasing her to return to work.  She states her breathing has improved.   Dyspnea:  mb and back flat better since symbicort 160 though hfa quite poor  Cough: none Sleeping: bed flat/ 2 pillows SABA use: rarely since symbicort 02: have it but not using x sometimes hs   rec Plan A = Automatic = Stiolto 2 pffs each am only and no more Symbicort  Work on inhaler technique:  relax and gently blow all the way out then take a nice smooth deep breath back in, triggering the inhaler at same time you start breathing in.  Hold for up to 5 seconds if you can.  Rinse and gargle with water when done Plan B = Backup Only use your albuterol as a rescue medication to be used if you can't catch your breath by resting or doing a relaxed purse lip breathing pattern.  -  The less you use it, the better it will work when you need it. - Ok to use the inhaler up to 2 puffs  every 4 hours if you must but call for appointment if use goes up over your usual need - Don't leave home without it !!  (think of it like the spare tire for your car)  Ok to return to light duty only  Keep previous appt      10/17/2017  f/u ov/Denise [name] re:  GOLD II copd / cor pulmonale - not using 02 as rec  Chief Complaint  Patient presents with  . Follow-up    Breathing is about the same. She has good days and bad days. She is using her albuterol inhaler 2 x daily on average.   Dyspnea:  mb and back with 02 is easier than 02 but still not using it Cough: minimal Sleeping: bed flat/ 2pillows SABA use: sev times a day  02:  Using 2lpm at bedtime and sometimes with activty   rec Continue stiolto 2 pffs each am  02 2lpm at bedtime and 2lpm with any activity  Please see patient  coordinator before you leave today  to schedule GI eval by Carlean Purl for abd pain / wt loss  Please schedule a follow up office visit in 2 weeks with full pfts and all active meds / inahlers in hand   11/01/2017  f/u ov/Denise [name] re:  GOLD II copd with emphysemic features/ chronic hypoxemia/ cor pulmonale  Chief Complaint  Patient presents with  . Follow-up    Breathing is "okay". She is using her albuterol inhaler once daily on average.   Dyspnea:  Still walking mb and back struggle if not wearing 02  Cough: no Sleeping: flat SABA use: once daily / confused with stiolto vs saba  02: prn/ confused with instructions   No obvious day to day or daytime variability or assoc excess/ purulent sputum or mucus plugs or hemoptysis or cp or chest tightness, subjective wheeze or overt sinus or hb symptoms.   Sleeping as above with and without 02  without nocturnal  or early am exacerbation  of respiratory  c/o's or need for noct saba. Also denies any obvious fluctuation of symptoms with weather or environmental changes or other aggravating or alleviating factors except as outlined above   No unusual exposure hx or h/o childhood pna/ asthma or knowledge of premature birth.  Current Allergies, Complete Past Medical History, Past Surgical History, Family History, and Social History were reviewed in [employer] record.  ROS  The following are not active complaints unless bolded Hoarseness, sore throat, dysphagia, dental problems, itching, sneezing,  nasal congestion or discharge of excess mucus or purulent secretions, ear ache,   fever, chills, sweats, unintended wt loss or wt gain, classically pleuritic or exertional cp,  orthopnea pnd or arm/hand swelling  or leg swelling, presyncope, palpitations, abdominal pain, anorexia, nausea, vomiting, diarrhea  or change in bowel habits or change in bladder habits, change in stools or change in urine, dysuria, hematuria,  rash, arthralgias, visual  complaints, headache, numbness, weakness or ataxia or problems with walking or coordination,  change in mood or  memory.        Current Meds  Medication Sig  . albuterol (PROVENTIL HFA;VENTOLIN HFA) 108 (90 Base) MCG/ACT inhaler Inhale 1-2 puffs into the lungs every 4 (four) hours as needed for wheezing or shortness of breath.  . famotidine (PEPCID) 40 MG tablet Take 1 tablet (40 mg  total) by mouth 2 (two) times daily.  [name] losartan (COZAAR) 100 MG tablet Take 1 tablet by mouth once daily for blood pressure.  . metoprolol tartrate (LOPRESSOR) 25 MG tablet Take 1 tablet (25 mg total) by mouth 2 (two) times daily.  . OXYGEN 2lpm with exertion as needed  . Tiotropium Bromide-Olodaterol (STIOLTO RESPIMAT) 2.5-2.5 MCG/ACT AERS Inhale 2 puffs into the lungs daily.  .     .    .                        Objective:   Physical Exam    11/01/2017        118  10/17/2017        114  10/05/2017        120   10/02/2017        119   07/27/16 144 lb 12.8 oz (65.7 kg)  06/02/16 136 lb 6.4 oz (61.9 kg)  11/22/15 156 lb 12.8 oz (71.1 kg)    Vital signs reviewed - Note on arrival 02 sats  93% on RA        HEENT: Full dentures/ nl oropharynx. Nl external ear canals without cough reflex -  Mild bilateral non-specific turbinate edema     NECK :  without JVD/Nodes/TM/ nl carotid upstrokes bilaterally   LUNGS: no acc muscle use,  Mild barrel  contour chest wall with bilateral  Distant bs s audible wheeze and  without cough on insp or exp maneuver and mild  Hyperresonant  to  percussion bilaterally     CV:  RRR  no s3 or murmur or increase in P2, and  Trace pedal edema sym bilaterally   ABD:  soft and nontender with pos mid insp Hoover's  in the supine position. No bruits or organomegaly appreciated, bowel sounds nl  MS:   Nl gait/  ext warm without deformities, calf tenderness, cyanosis or clubbing No obvious joint restrictions   SKIN: warm and dry without lesions    NEURO:  alert, approp,  nl sensorium with  no motor or cerebellar deficits apparent.          I personally reviewed images and agree with radiology impression as follows:  CXR:   09/18/17  No active cardiopulmonary disease. Hyperinflation and emphysematous disease.    Assessment:

## 2017-11-01 NOTE — Progress Notes (Signed)
PFT done today.

## 2017-11-01 NOTE — Assessment & Plan Note (Addendum)
Quit smoking 1989 - Spirometry 09/11/2016  FEV1 1.03 (52%)  Ratio 54 mild curvature, on no rx - 09/11/2016  After extensive coaching HFA effectiveness =    50% from baseline 10% > continue  symb 160 2bid but consider change to bevespi or stiolto  - 10/02/2017  After extensive coaching inhaler device,  effectiveness =    50% from a baseline near 0 > rechallenge with symbicort 160 x 2 bid x 2 weeks then re-group  - 10/05/2017    try stiolto x 2pffs x 2 week sample - 11/01/2017  After extensive coaching inhaler device,  effectiveness =    75% from baseline < 50% with smi > continue stiolto 2 pffs each am  - PFT's  11/01/2017  FEV1 0.95 (52 % ) ratio fev1/vc = 69%  p 3 % improvement from saba p ? Stiolto(not sure she used it)  prior to study with DLCO  30/30c % corrects to 59 % for alv volume  With air trapping pattern  confirmed on cxr as well   Pt continues to be  Group B in terms of symptom/risk and laba/lama therefore appropriate rx at this point >>> continue stiolto 2 each am and prn saba   11/01/2017  After extensive coaching inhaler device,  effectiveness =    90%

## 2017-11-01 NOTE — Patient Instructions (Addendum)
Plan A = Automatic = stiolto 2 pffs just in am and 02 at bedtime   Work on inhaler technique:  relax and gently blow all the way out then take a nice smooth deep breath back in, triggering the inhaler at same time you start breathing in.  Hold for up to 5 seconds if you can. Blow out thru nose. Rinse and gargle with water when done   Wear 02 2lpm with any activity more than room to room       Plan B = Backup Only use your albuterol as a rescue medication to be used if you can't catch your breath by resting or doing a relaxed purse lip breathing pattern.  - The less you use it, the better it will work when you need it. - Ok to use the inhaler up to 2 puffs  every 4 hours if you must but call for appointment if use goes up over your usual need - Don't leave home without it !!  (think of it like the spare tire for your car)      Please schedule a follow up office visit in 6 weeks, call sooner if needed with all medications /inhalers/ solutions in hand so we can verify exactly what you are taking. This includes all medications from all doctors and over the [place] separate them into two bags:  the ones you take automatically, no matter what, vs the ones you take just when you feel you need them "BAG #2 is UP TO YOU"  - this will really help [place] help you take your medications more effectively.    Your Nov 8 appt is with cardiology: [phone].

## 2017-11-04 ENCOUNTER — Encounter: Payer: Self-pay | Admitting: Internal Medicine

## 2017-11-04 NOTE — Assessment & Plan Note (Signed)
Adequate control on present rx, reviewed in detail with pt > no change in rx needed   >>> losartan renewed at her request pending f/u with pcp/ cards

## 2017-11-04 NOTE — Assessment & Plan Note (Addendum)
Assoc with ? Cor pulmonale dx 09/2017 so rx 02 1-2 lpm 24/7  - 10/17/2017  Walked RA x 3 laps @ 185 ft each stopped due to  End of study, nl pace,  desat to 87 on 3rd lap   Explained goals of keeping sats > 90% at all times so needs to wear 02 2lpm hs and with activity > room to room walking or monitor sats more often on her own and titrate to keep >90%    I had an extended discussion with the patient and daughter reviewing all relevant studies completed to date and  lasting 15 to 20 minutes of a 25 minute visit    See device teaching which extended face to face time for this visit   Each maintenance medication was reviewed in detail including most importantly the difference between maintenance and prns and under what circumstances the prns are to be triggered using an action plan format that is not reflected in the computer generated alphabetically organized AVS.     Please see AVS for specific instructions unique to this visit that I personally wrote and verbalized to the the pt in detail and then reviewed with pt  by my nurse highlighting any  changes in therapy recommended at today's visit to their plan of care.

## 2017-11-04 NOTE — Assessment & Plan Note (Signed)
See [place] 09/20/17 with nl pressures but low CO ? Accuracy  C/w WHO III, rx is to treat the underlying cause (COPD) and keep sats > 90% at all times  F/u also planned with Dr Tempie Hoist

## 2017-11-09 ENCOUNTER — Ambulatory Visit (HOSPITAL_COMMUNITY)
Admission: RE | Admit: 2017-11-09 | Discharge: 2017-11-09 | Disposition: A | Discharge status: Home / Self Care | Payer: Medicare HMO | Source: Ambulatory Visit | Attending: Internal Medicine | Admitting: Internal Medicine

## 2017-11-09 VITALS — BP 153/90 | HR 64 | Wt 115.6 lb

## 2017-11-09 DIAGNOSIS — E785 Hyperlipidemia, unspecified: Secondary | ICD-10-CM | POA: Insufficient documentation

## 2017-11-09 DIAGNOSIS — Z85038 Personal history of other malignant neoplasm of large intestine: Secondary | ICD-10-CM | POA: Diagnosis not present

## 2017-11-09 DIAGNOSIS — E119 Type 2 diabetes mellitus without complications: Secondary | ICD-10-CM | POA: Insufficient documentation

## 2017-11-09 DIAGNOSIS — Z9981 Dependence on supplemental oxygen: Secondary | ICD-10-CM | POA: Diagnosis not present

## 2017-11-09 DIAGNOSIS — F329 Major depressive disorder, single episode, unspecified: Secondary | ICD-10-CM | POA: Diagnosis not present

## 2017-11-09 DIAGNOSIS — J9611 Chronic respiratory failure with hypoxia: Secondary | ICD-10-CM | POA: Insufficient documentation

## 2017-11-09 DIAGNOSIS — I272 Pulmonary hypertension, unspecified: Secondary | ICD-10-CM | POA: Diagnosis not present

## 2017-11-09 DIAGNOSIS — Z87891 Personal history of nicotine dependence: Secondary | ICD-10-CM | POA: Insufficient documentation

## 2017-11-09 DIAGNOSIS — F419 Anxiety disorder, unspecified: Secondary | ICD-10-CM | POA: Insufficient documentation

## 2017-11-09 DIAGNOSIS — R634 Abnormal weight loss: Secondary | ICD-10-CM | POA: Diagnosis not present

## 2017-11-09 DIAGNOSIS — I5032 Chronic diastolic (congestive) heart failure: Secondary | ICD-10-CM | POA: Insufficient documentation

## 2017-11-09 DIAGNOSIS — Z79899 Other long term (current) drug therapy: Secondary | ICD-10-CM | POA: Diagnosis not present

## 2017-11-09 DIAGNOSIS — G4700 Insomnia, unspecified: Secondary | ICD-10-CM | POA: Diagnosis not present

## 2017-11-09 DIAGNOSIS — E210 Primary hyperparathyroidism: Secondary | ICD-10-CM | POA: Insufficient documentation

## 2017-11-09 DIAGNOSIS — J449 Chronic obstructive pulmonary disease, unspecified: Secondary | ICD-10-CM | POA: Insufficient documentation

## 2017-11-09 DIAGNOSIS — I110 Hypertensive heart disease with heart failure: Secondary | ICD-10-CM | POA: Diagnosis not present

## 2017-11-09 NOTE — Progress Notes (Addendum)
Advanced Heart Failure Clinic Note   Referring Physician: Dr. Johnsie Cancel PCP: [name], NP PCP-Cardiologist: No primary care provider on file.   HPI:  Denise [name] is a 72 y.o. female with severe COPD, pulmonary HTN, Diastolic CHF, HLD, DM2, Colon cancer s/p resection, hyperparathyroidism pending surgery, and Chronic respiratory failure on O2, and HTN.   Admitted 9/15 - 09/24/17 for acute on chronic hypoxic respiratory failure. RHC at that time by Dr. Aundra Dubin showed significant pulmonary HTN. Thought to be most consistent with WHO group III PH pending PFTS. Hospital course complicated by abdominal pain. GI work up with no clear cause.   Last seen in [facility] clinic 10/03/17 and felt to be stable. Referred to HF clinic for [facility] evaluation/treatment.   Seen by Dr. Melvyn Novas 11/01/17. PFTs that day with significant COPD.   She presents today to establish in the Pulmonary Hypertension clinic for further evaluation and consideration of treatment. Uses O2 at home at times, but doesn't use when she travels (Doesn't have anything portable). Parked at [employer]	[place] this am, and was SOB walking over. Denies SOB with ADLs. Mild lightheadedness with rapid standing, but not marked or limiting. Not lightheaded from bending over. Has mild peripheral edema that is controlled with compression hose. Denies orthopnea or PND. Quit smoking in 1989. Had > 30 pack years.   PFTs 11/01/17 FVC 1.35 (57%), FEV1 0.91 (50%), DLCO 7.42 (30%)  Echo 09/18/17 LVEF 60-65%, Grade 1 DD, Mild MR, Trivial MR, Normal RV, MIld TR, PA peak pressure 69 mm Hg.   RHC 09/20/17  RA mean 3 RV 57/2 PA 57/16, mean 32 PCWP mean 5 Oxygen saturations: PA 51% AO 93% Cardiac Output (Fick) 2.6  Cardiac Index (Fick) 1.61 PVR 10 WU (Note by Dr. Aundra Dubin: Not sure that cardiac output as obtained is correct.  Oxygen saturation was up and down in the cath lab, we used a pulse ox saturation.  It is possible saturation was truly lower and CO  higher.)  Review of systems complete and found to be negative unless listed in HPI.    Past Medical History:  Diagnosis Date  . Altered mental status   . Anxiety and depression   . Colon cancer ([place]) 1988   Resected  . COPD (chronic obstructive pulmonary disease) ([place])   . Diabetes mellitus without complication ([name])    diet controlled- no meds. per pt  . Diverticulosis   . Hyperlipidemia   . Hypertension   . Insomnia   . Primary hyperparathyroidism ([place])   . Tubular adenoma of rectum 02/23/2013   low grade  . Vitamin D deficiency     Current Outpatient Medications  Medication Sig Dispense Refill  . albuterol (PROVENTIL HFA;VENTOLIN HFA) 108 (90 Base) MCG/ACT inhaler Inhale 1-2 puffs into the lungs every 4 (four) hours as needed for wheezing or shortness of breath. 3 Inhaler 0  . famotidine (PEPCID) 40 MG tablet Take 1 tablet (40 mg total) by mouth 2 (two) times daily. 180 tablet 0  . losartan (COZAAR) 100 MG tablet Take 1 tablet by mouth once daily for blood pressure. 90 tablet 0  . metoprolol tartrate (LOPRESSOR) 25 MG tablet Take 1 tablet (25 mg total) by mouth 2 (two) times daily. 180 tablet 0  . OXYGEN 2lpm with exertion as needed    . Tiotropium Bromide-Olodaterol (STIOLTO RESPIMAT) 2.5-2.5 MCG/ACT AERS Inhale 2 puffs into the lungs daily. 1 Inhaler 0   No current facility-administered medications for this encounter.     No  Known Allergies    Social History   Socioeconomic History  . Marital status: Married    Spouse name: Not on file  . Number of children: 3  . Years of education: Not on file  . Highest education level: Not on file  Occupational History  . Not on file  Social Needs  . Financial resource strain: Not on file  . Food insecurity:    Worry: Not on file    Inability: Not on file  . Transportation needs:    Medical: Not on file    Non-medical: Not on file  Tobacco Use  . Smoking status: Former Smoker    Packs/day: 1.00    Years: 30.00     Pack years: 30.00    Types: Cigarettes    Last attempt to quit: 1989    Years since quitting: 30.8  . Smokeless tobacco: Never Used  Substance and Sexual Activity  . Alcohol use: No  . Drug use: No  . Sexual activity: Yes    Partners: Male    Birth control/protection: Post-menopausal, Surgical  Lifestyle  . Physical activity:    Days per week: Not on file    Minutes per session: Not on file  . Stress: Not on file  Relationships  . Social connections:    Talks on phone: Not on file    Gets together: Not on file    Attends religious service: Not on file    Active member of club or organization: Not on file    Attends meetings of clubs or organizations: Not on file    Relationship status: Not on file  . Intimate partner violence:    Fear of current or ex partner: Not on file    Emotionally abused: Not on file    Physically abused: Not on file    Forced sexual activity: Not on file  Other Topics Concern  . Not on file  Social History Narrative   Married. 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 Ovarian cancer Mother   . Breast cancer Neg Hx   . Hyperparathyroidism Neg Hx   . Colon cancer Neg Hx   . Esophageal cancer Neg Hx   . Stomach cancer Neg Hx     Vitals:   11/09/17 0958  BP: (!) 153/90  Pulse: 64  SpO2: 94%  Weight: 52.4 kg (115 lb 9.6 oz)   Wt Readings from Last 3 Encounters:  11/09/17 52.4 kg (115 lb 9.6 oz)  11/01/17 53.5 kg (118 lb)  10/30/17 53.1 kg (117 lb)    PHYSICAL EXAM: General:  Well appearing. No respiratory difficulty HEENT: normal Neck: supple. no JVD. Carotids 2+ bilat; no bruits. No lymphadenopathy or thyromegaly appreciated. Cor: PMI nondisplaced. Regular rate & rhythm. No rubs, gallops or murmurs. Lungs: clear Abdomen: soft, nontender, nondistended. No hepatosplenomegaly. No bruits or masses. Good  bowel sounds. Extremities: no cyanosis, clubbing, rash, edema Neuro: alert & oriented x 3, cranial nerves grossly intact. moves all 4 extremities w/o difficulty. Affect pleasant.  ASSESSMENT & PLAN:  1. Pulmonary HTN: Significant by  9/19 with PVR 10 WU. With her degree of COPD, suspect primarily WHO group 3 in setting of significant COPD. NYHA III symptoms chronically, though somewhat confounded by her deconditioning and weight loss from hyperparathyroidism.  - Echo 09/18/17  LVEF 60-65%, Grade 1 DD, Mild MR, Trivial MR, Normal RV, MIld TR, PA peak pressure 69 mm Hg.  - VQ scan 09/17/17 not suggestive of Chronic PEs.  - PFTs 11/01/17 FVC 1.35 (57%), FEV1 0.91 (50%), DLCO 7.42 (30%)  2. HTN - Continue losartan 100 mg daily. - Continue lopressor 25 mg BID  3. Chronic hypoxic respiratory failure:  - Uses O2 at home.   4. Hyperparathyroidism.  - Needs clearance for surgery.  - She has normal EF  Denise Friar, PA-C 11/09/17   Patient seen with PA, agree with the above note.    I reviewed her [place] from 9/19.  She has moderate PAH with low filling pressures.  PVR may actually be lower and CO may actually be higher, oxygen saturations varied significantly during the study and Fick measurements may not be accurate.  I reviewed her PFTs => they are suggestive of severe COPD/emphysema.  She is on home oxygen but does not have portable oxygen for when she goes out of the house.  Echo was reviewed from 9/19 and showed normal right and left ventricular systolic function.   On exam, she is not volume overloaded.  Distant breath sounds.    I think that her pulmonary hypertension is primarily group 3 due to parenchymal lung disease.  I do not think that she is very likely to make significant improvement with selective pulmonary vasodilators.  Instead, treatment should be directed at her underlying issue which appears to be severe COPD.  I will try to get her portable oxygen to use outside the  house.   Volume status looks ok, I do not think that she needs Lasix.   She needs surgery for primary hyperparathyroidism.  She is stable for this from a cardiac standpoint.  More important will be pulmonary clearance from Dr. Melvyn Novas.   She can followup in this office as needed, will not schedule formal followup.   Denise [name] 11/09/2017 11:12 AM

## 2017-11-09 NOTE — Addendum Note (Signed)
Encounter addended by: Scarlette Calico, RN on: 11/09/2017 12:45 PM  Actions taken: Sign clinical note

## 2017-11-09 NOTE — Patient Instructions (Signed)
Follow up as needed

## 2017-11-09 NOTE — Progress Notes (Signed)
Order faxed to [facility] for oxygen concentrator eval for pt to get portable oxygen tanks.

## 2017-11-09 NOTE — Addendum Note (Signed)
Encounter addended by: Larey Dresser, MD on: 11/09/2017 11:13 AM  Actions taken: Sign clinical note

## 2017-11-25 DIAGNOSIS — J449 Chronic obstructive pulmonary disease, unspecified: Secondary | ICD-10-CM | POA: Diagnosis not present

## 2017-11-25 DIAGNOSIS — I272 Pulmonary hypertension, unspecified: Secondary | ICD-10-CM | POA: Diagnosis not present

## 2017-11-25 DIAGNOSIS — J439 Emphysema, unspecified: Secondary | ICD-10-CM | POA: Diagnosis not present

## 2017-12-07 ENCOUNTER — Telehealth: Payer: Self-pay | Admitting: Internal Medicine

## 2017-12-07 ENCOUNTER — Other Ambulatory Visit: Payer: Self-pay | Admitting: Primary Care

## 2017-12-07 DIAGNOSIS — J449 Chronic obstructive pulmonary disease, unspecified: Secondary | ICD-10-CM

## 2017-12-07 NOTE — Telephone Encounter (Signed)
Call made to patient, patient states she wants a POC. I explained to the patient that [facility] not longer provides POC and she would need to switch DME companies. I also informed her she would need to be re-qualified for her oxygen. Patient states she would like this addressed at her office visit. Informed patient I would update her appointment notes to reflect this. I verified the new location and phone number. Patient voices understanding. Nothing further is needed at this time.

## 2017-12-13 ENCOUNTER — Ambulatory Visit: Payer: Medicare HMO | Admitting: Internal Medicine

## 2017-12-14 ENCOUNTER — Ambulatory Visit: Payer: Medicare HMO | Admitting: Internal Medicine

## 2017-12-19 ENCOUNTER — Encounter: Payer: Self-pay | Admitting: Internal Medicine

## 2017-12-19 ENCOUNTER — Ambulatory Visit: Payer: Medicare HMO | Admitting: Internal Medicine

## 2017-12-19 VITALS — BP 154/88 | HR 82 | Ht 64.25 in | Wt 112.0 lb

## 2017-12-19 DIAGNOSIS — Z23 Encounter for immunization: Secondary | ICD-10-CM

## 2017-12-19 DIAGNOSIS — J449 Chronic obstructive pulmonary disease, unspecified: Secondary | ICD-10-CM

## 2017-12-19 DIAGNOSIS — I272 Pulmonary hypertension, unspecified: Secondary | ICD-10-CM | POA: Diagnosis not present

## 2017-12-19 DIAGNOSIS — J9611 Chronic respiratory failure with hypoxia: Secondary | ICD-10-CM

## 2017-12-19 MED ORDER — PREDNISONE 10 MG PO TABS: ORAL_TABLET | ORAL | 0 refills | Status: DC

## 2017-12-19 NOTE — Progress Notes (Addendum)
Subjective:     Patient ID: Denise [name], female   DOB: Oct 20, 1945,    MRN: [id number]    Brief patient profile:  36 yobf  Quit smoking 1989 with doe then dx'd with copd in [place] in mid 1990s where lived x 25 years and started on spiriva around 2005-10 and helped some and since 2017 back in [place] and breathing generally better in [place] but not doing as many steps and changed from spiriva to symb but not taking it regulary and no need for rescue inhaler referred to pulmonary clinic 09/11/2016 by Dr   Denise [name].    History of Present Illness  09/11/2016 1st [facility] office visit/ Denise [name]  Re GOLD II copd  Chief Complaint  Patient presents with  . pulmonary consult    Referred by Dr. Carlis [name] for COPD. patient states she is out of breath on exertion, denies chest pain and chest tightness  doe = MMRC2 = can't walk a nl pace on a flat grade s sob but does fine slow and flat eg shopping  rec Plan A = Automatic =  symbicort 160 up to 2 every 12 hours Work on inhaler technique:  Plan B = Backup Only use your albuterol (ventolin) as a rescue medication Return in 3 months     10/02/2017 extended post hosp f/u ov/Denise [name] re: copd /clearance for parathyroid surgery Chief Complaint  Patient presents with  . Hospitalization Follow-up    admitted to hospital 09/16/17 until 09/24/17. She was sent home with o2 but not using it much. She states she has occ SOB with exertion. She is using her ventolin inhaler 4 x daily on average.   Dyspnea:  MMRC3 = can't walk 100 yards even at a slow pace at a flat grade s stopping due to sob   Cough: not much at al now  Sleeping: bed flat/ 2 pillows  SABA use: 4 x daily - was less while on symb 160 vs advair 250 though hfa quite poor (see copd a/p)  02:  1-2lpm   rec Plan A = Automatic = symbicort 160 Take 2 puffs first thing in am and then another 2 puffs about 12 hours later.  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 Follow-up    She states she is needing letter releasing her to return to work.  She states her breathing has improved.   Dyspnea:  mb and back flat better since symbicort 160 though hfa quite poor  Cough: none Sleeping: bed flat/ 2 pillows SABA use: rarely since symbicort 02: have it but not using x sometimes hs   rec Plan A = Automatic = Stiolto 2 pffs each am only and no more Symbicort  Work on inhaler technique:  relax and gently blow all the way out then take a nice smooth deep breath back in, triggering the inhaler at same time you start breathing in.  Hold for up to 5 seconds if you can.  Rinse and gargle with water when done Plan B = Backup Only use your albuterol as a rescue medication to be used if you can't catch your breath by resting or doing a relaxed purse lip breathing pattern.  -  The less you use it, the better it will work when you need it. - Ok to use the inhaler up to 2 puffs  every 4 hours if you must but call for appointment if use goes up over your usual need - Don't leave home without it !!  (think of it like the spare tire for your car)  Ok to return to light duty only  Keep previous appt      10/17/2017  f/u ov/Denise [name] re:  GOLD II copd / cor pulmonale - not using 02 as rec  Chief Complaint  Patient presents with  . Follow-up    Breathing is about the same. She has good days and bad days. She is using her albuterol inhaler 2 x daily on average.   Dyspnea:  mb and back with 02 is easier than 02 but still not using it Cough: minimal Sleeping: bed flat/ 2pillows SABA use: sev times a day  02:  Using 2lpm at bedtime and sometimes with activty   rec Continue stiolto 2 pffs each am  02 2lpm at bedtime and 2lpm with any activity  Please see patient  coordinator before you leave today  to schedule GI eval by Denise [name] for abd pain / wt loss  Please schedule a follow up office visit in 2 weeks with full pfts and all active meds / inahlers in hand   11/01/2017  f/u ov/Denise [name] re:  GOLD II copd with emphysemic features/ chronic hypoxemia/ cor pulmonale  Chief Complaint  Patient presents with  . Follow-up    Breathing is "okay". She is using her albuterol inhaler once daily on average.   Dyspnea:  Still walking mb and back struggle if not wearing 02  Cough: no Sleeping: flat SABA use: once daily / confused with stiolto vs saba  02: prn/ confused with instructions rec Plan A = Automatic = stiolto 2 pffs just in am and 02 at bedtime  Work on inhaler technique:   Wear 02 2lpm with any activity more than room to room  Plan B = Backup Only use your albuterol as a rescue medication   Please schedule a follow up office visit in 6 weeks, call sooner if needed with all medications /inhalers/ solutions in hand so we can verify exactly what you are taking.   Your Nov 8 appt is with cardiology: [phone]    12/19/2017  f/u ov/Denise [name] re: GOLD II spirometry with emphysemic features/ hypoxemia and cor pulmonale  Chief Complaint  Patient presents with  . Follow-up    Breathing has been worse over the past 2 wks. She is getting SOB walking just a few steps. She is not using her o2 as prescribed.   Dyspnea:  Better with 02 than s  Cough: no excess mucus  Sleeping: on side/ 2-3 pillows/ bed flat  SABA use: none 02: wears 02 2lpm at bedtime not using daytime and did not bring what she does have with her to office    No obvious day to day or daytime variability or assoc excess/ purulent sputum or mucus plugs or hemoptysis or cp or chest tightness, subjective wheeze or overt sinus or hb symptoms.   Sleeping as above without nocturnal  or early am exacerbation  of respiratory  c/o's or need for noct saba. Also denies any obvious fluctuation of symptoms  with weather or environmental changes or other aggravating or alleviating factors except as outlined above   No unusual exposure hx or h/o childhood pna/ asthma or  knowledge of premature birth.  Current Allergies, Complete Past Medical History, Past Surgical History, Family History, and Social History were reviewed in [employer] record.  ROS  The following are not active complaints unless bolded Hoarseness, sore throat, dysphagia, dental problems, itching, sneezing,  nasal congestion or discharge of excess mucus or purulent secretions, ear ache,   fever, chills, sweats, unintended wt loss or wt gain, classically pleuritic or exertional cp,  orthopnea pnd or arm/hand swelling  or leg swelling, presyncope, palpitations, abdominal pain, anorexia, nausea, vomiting, diarrhea  or change in bowel habits or change in bladder habits, change in stools or change in urine, dysuria, hematuria,  rash, arthralgias, visual complaints, headache, numbness, weakness or ataxia or problems with walking or coordination,  change in mood or  memory.        Current Meds  Medication Sig  . acetaminophen (TYLENOL) 325 MG tablet Take 650 mg by mouth every 6 (six) hours as needed.  [name] albuterol (PROVENTIL HFA;VENTOLIN HFA) 108 (90 Base) MCG/ACT inhaler Inhale 1-2 puffs into the lungs every 4 (four) hours as needed for wheezing or shortness of breath.  .     . famotidine (PEPCID) 40 MG tablet Take 1 tablet (40 mg total) by mouth 2 (two) times daily.  [name] losartan (COZAAR) 100 MG tablet Take 1 tablet by mouth once daily for blood pressure.  . Melatonin 5 MG TABS Take by mouth.  . metoprolol tartrate (LOPRESSOR) 25 MG tablet Take 1 tablet (25 mg total) by mouth 2 (two) times daily.  . mirtazapine (REMERON) 15 MG tablet Take 15 mg by mouth at bedtime.  . OXYGEN 2lpm with exertion as needed  . sertraline (ZOLOFT) 25 MG tablet Take 25 mg by mouth daily.  . Tiotropium Bromide-Olodaterol (STIOLTO RESPIMAT)  2.5-2.5 MCG/ACT AERS Inhale 2 puffs into the lungs daily.                 Objective:   Physical Exam   12/19/2017       112  11/01/2017        118  10/17/2017        114  10/05/2017        120   10/02/2017        119   07/27/16 144 lb 12.8 oz (65.7 kg)  06/02/16 136 lb 6.4 oz (61.9 kg)  11/22/15 156 lb 12.8 oz (71.1 kg)       HEENT: Full dentures/ nl oropharynx. Nl external ear canals without cough reflex -  Mild bilateral non-specific turbinate edema     NECK :  without JVD/Nodes/TM/ nl carotid upstrokes bilaterally   LUNGS: no acc muscle use,  Mod barrel  contour chest wall with bilateral  Distant bs s audible wheeze and  without cough on insp or exp maneuver and mod  Hyperresonant  to  percussion bilaterally     CV:  RRR  no s3 or murmur or increase in P2, and no edema   ABD:  soft and nontender with pos mid  insp Hoover's  in the supine position. No bruits or organomegaly appreciated, bowel sounds nl  MS:   Nl gait/  ext warm without deformities, calf tenderness, cyanosis or clubbing No obvious joint restrictions   SKIN: warm and dry without lesions    NEURO:  alert, approp, nl sensorium with  no motor or cerebellar deficits apparent.           Assessment:

## 2017-12-19 NOTE — Patient Instructions (Addendum)
Prednisone 10 mg  X 2 each am with bfast, once better reduce to one daily   No change in stiolto  Wear 2lpm at bedtime  Please see patient coordinator before you leave today  to schedule best fit per advanced for ambulatory 02 to keep your 02 level over 90%    Please schedule a follow up office visit with NP  in  2 weeks, sooner if needed  with all medications /inhalers/ solutions in hand so we can verify exactly what you are taking. This includes all medications from all doctors and over the counters

## 2017-12-20 ENCOUNTER — Encounter: Payer: Self-pay | Admitting: Internal Medicine

## 2017-12-20 NOTE — Assessment & Plan Note (Signed)
Assoc with ? Cor pulmonale dx 09/2017 so rx 02 1-2 lpm 24/7  - 10/17/2017  Walked RA x 3 laps @ 185 ft each stopped due to  End of study, nl pace,  desat to 87 on 3rd lap  - 12/19/2017  Saturations on Room Air at Rest =91 % and while Ambulating = 87%  But  on  2 Liters of pulsed oxygen while Ambulating =93% so rec POC 2lpm walking / use at rest if sats under 90%   Surprisingly pt still working in housekeeping and it is not convenient for her to wear the 02 24/7 as supply will run out long before she gets home so reasonable to try plan as above or consider full  retirement but the bottom line is 02 is the best rx we can offer her and need to be sure we work together with dme company to provide it.

## 2017-12-20 NOTE — Assessment & Plan Note (Addendum)
See [place] 09/20/17  With  low CO ? Accuracy of PVR     C/w cor pulmonale, main rx is 02 (see separate a/p)    I had an extended discussion with the patient and fm reviewing all relevant studies completed to date and  lasting 15 to 20 minutes of a 25 minute visit  Which included directly observing walking 02 study.   See device teaching which also  extended face to face time for this visit.  Each maintenance medication was reviewed in detail including emphasizing most importantly the difference between maintenance and prns and under what circumstances the prns are to be triggered using an action plan format that is not reflected in the computer generated alphabetically organized AVS which I have not found useful in most complex patients, especially with respiratory illnesses  Please see AVS for specific instructions unique to this visit that I personally wrote and verbalized to the the pt in detail and then reviewed with pt  by my nurse highlighting any  changes in therapy recommended at today's visit to their plan of care.

## 2017-12-25 DIAGNOSIS — I272 Pulmonary hypertension, unspecified: Secondary | ICD-10-CM | POA: Diagnosis not present

## 2017-12-25 DIAGNOSIS — J439 Emphysema, unspecified: Secondary | ICD-10-CM | POA: Diagnosis not present

## 2017-12-25 DIAGNOSIS — J449 Chronic obstructive pulmonary disease, unspecified: Secondary | ICD-10-CM | POA: Diagnosis not present

## 2017-12-28 DIAGNOSIS — J9611 Chronic respiratory failure with hypoxia: Secondary | ICD-10-CM | POA: Diagnosis not present

## 2018-01-01 DIAGNOSIS — J9611 Chronic respiratory failure with hypoxia: Secondary | ICD-10-CM | POA: Diagnosis not present

## 2018-01-09 ENCOUNTER — Ambulatory Visit (INDEPENDENT_AMBULATORY_CARE_PROVIDER_SITE_OTHER): Payer: Self-pay | Admitting: Primary Care

## 2018-01-09 ENCOUNTER — Encounter: Payer: Self-pay | Admitting: Primary Care

## 2018-01-09 DIAGNOSIS — J449 Chronic obstructive pulmonary disease, unspecified: Secondary | ICD-10-CM

## 2018-01-09 DIAGNOSIS — J9611 Chronic respiratory failure with hypoxia: Secondary | ICD-10-CM

## 2018-01-09 MED ORDER — TIOTROPIUM BROMIDE-OLODATEROL 2.5-2.5 MCG/ACT IN AERS: 2.0000 | INHALATION_SPRAY | Freq: Every day | RESPIRATORY_TRACT | 1 refills | Status: DC

## 2018-01-09 NOTE — Assessment & Plan Note (Signed)
-  Received new POC - O2 97% 2L today

## 2018-01-09 NOTE — Progress Notes (Signed)
_0  ID: Denise [name], female    DOB: [birth year], 73 y.o.   MRN: [id number]  Chief Complaint  Patient presents with  . Follow-up    SOB sometimes with exertion, cough with light yellow mucus,     Referring provider: [name], NP  HPI: 73 year old female, former smoker quit in 1989. PMH significant for COPD gold II, chronic respiratory failure with hypoxia, pulmonary hypertension with cor pulmonale. Patient of Dr. Melvyn Novas, last seen on  12/19/17 for shortness of breath. She was given course of prednisone. Wears O2 at night 2L. Needs to qualify for POC.  01/09/2018 Patient presents today for 2-4 week follow-up. Accompanied by her husband and daughter. She is doing well, no acute complaints. Has some shortness of breath with exertion. Has new portable oxygen canister, currently on 2L oxygen sating 97%. Some confusion with medications, unsure which inhaler she should be using. Only using Stiolto as needed.    Testing: PFTs 11/01/17- FVC 1.25 (53%), FEV1 0.95 (52%), ratio 76 EKG 09/18/17- SR with PACs   No Known Allergies  Immunization History  Administered Date(s) Administered  . Influenza, High Dose Seasonal PF 10/18/2016, 12/19/2017  . Influenza,inj,Quad PF,6+ Mos 09/27/2017    Past Medical History:  Diagnosis Date  . Altered mental status   . Anxiety and depression   . Colon cancer ([place]) 1988   Resected  . COPD (chronic obstructive pulmonary disease) ([place])   . Diabetes mellitus without complication ([name])    diet controlled- no meds. per pt  . Diverticulosis   . Hyperlipidemia   . Hypertension   . Insomnia   . Primary hyperparathyroidism ([place])   . Tubular adenoma of rectum 02/23/2013   low grade  . Vitamin D deficiency     Tobacco History: Social History   Tobacco Use  Smoking Status Former Smoker  . Packs/day: 1.00  . Years: 30.00  . Pack years: 30.00  . Types: Cigarettes  . Last attempt to quit: 1989  . Years since quitting: 31.0    Smokeless Tobacco Never Used   Counseling given: Not Answered   Outpatient Medications Prior to Visit  Medication Sig Dispense Refill  . albuterol (PROVENTIL HFA;VENTOLIN HFA) 108 (90 Base) MCG/ACT inhaler INHALE 1-2 PUFFS INTO THE LUNGS EVERY 4 (FOUR) HOURS AS NEEDED FOR WHEEZING OR SHORTNESS OF BREATH. 8.5 Inhaler 0  . famotidine (PEPCID) 40 MG tablet Take 1 tablet (40 mg total) by mouth 2 (two) times daily. 180 tablet 0  . Fluticasone-Salmeterol (WIXELA INHUB) 250-50 MCG/DOSE AEPB Inhale 1 puff into the lungs 2 (two) times daily.    [name] losartan (COZAAR) 100 MG tablet Take 1 tablet by mouth once daily for blood pressure. 90 tablet 0  . metoprolol tartrate (LOPRESSOR) 25 MG tablet Take 1 tablet (25 mg total) by mouth 2 (two) times daily. 180 tablet 0  . mirtazapine (REMERON) 15 MG tablet Take 15 mg by mouth at bedtime.    [name] omeprazole (PRILOSEC) 20 MG capsule Take 20 mg by mouth daily.    . OXYGEN 2lpm with exertion as needed    . predniSONE (DELTASONE) 10 MG tablet Take as directed 100 tablet 0  . Tiotropium Bromide Monohydrate (SPIRIVA RESPIMAT) 2.5 MCG/ACT AERS Inhale 2 puffs into the lungs daily as needed.    [name] albuterol (PROVENTIL HFA;VENTOLIN HFA) 108 (90 Base) MCG/ACT inhaler Inhale 1-2 puffs into the lungs every 4 (four) hours as needed for wheezing or shortness of breath. 3 Inhaler 0  . acetaminophen (  TYLENOL) 325 MG tablet Take 650 mg by mouth every 6 (six) hours as needed.    . Melatonin 5 MG TABS Take by mouth.    . sertraline (ZOLOFT) 25 MG tablet Take 25 mg by mouth daily.    . Tiotropium Bromide-Olodaterol (STIOLTO RESPIMAT) 2.5-2.5 MCG/ACT AERS Inhale 2 puffs into the lungs daily. (Patient not taking: Reported on 01/09/2018) 1 Inhaler 0   No facility-administered medications prior to visit.     Review of Systems  Review of Systems  Constitutional: Negative.   HENT: Negative.   Respiratory: Positive for shortness of breath and wheezing. Negative for cough.    Cardiovascular: Negative.     Physical Exam  BP (!) 148/92 (BP Location: Right Arm, Cuff Size: Normal)   Pulse 75   Temp 98.2 F (36.8 C)   Ht 5' 4.25" (1.632 m)   Wt 116 lb (52.6 kg)   SpO2 97%   BMI 19.76 kg/m  Physical Exam Constitutional:      Appearance: Normal appearance. She is normal weight.  HENT:     Head: Normocephalic and atraumatic.     Mouth/Throat:     Mouth: Mucous membranes are moist.     Pharynx: Oropharynx is clear.  Neck:     Musculoskeletal: Normal range of motion and neck supple.  Cardiovascular:     Rate and Rhythm: Normal rate.     Heart sounds: Normal heart sounds.     Comments: Regularly irregular (EKG in 2019 showed SR with PACs) Pulmonary:     Effort: Pulmonary effort is normal.     Breath sounds: Normal breath sounds. No wheezing or rhonchi.     Comments: CTA O2 97% 2L on POC Musculoskeletal: Normal range of motion.  Skin:    General: Skin is warm and dry.  Neurological:     General: No focal deficit present.     Mental Status: She is alert and oriented to person, place, and time. Mental status is at baseline.  Psychiatric:        Mood and Affect: Mood normal.        Behavior: Behavior normal.        Thought Content: Thought content normal.        Judgment: Judgment normal.      Lab Results:  CBC    Component Value Date/Time   WBC 5.8 09/28/2017 1559   RBC 4.73 09/28/2017 1559   HGB 13.1 09/28/2017 1559   HCT 39.1 09/28/2017 1559   PLT 281 09/28/2017 1559   MCV 82.7 09/28/2017 1559   MCH 27.7 09/28/2017 1559   MCHC 33.5 09/28/2017 1559   RDW 13.4 09/28/2017 1559   LYMPHSABS 0.4 (L) 09/17/2017 0350   MONOABS 0.3 09/17/2017 0350   EOSABS 0.0 09/17/2017 0350   BASOSABS 0.0 09/17/2017 0350    BMET    Component Value Date/Time   NA 138 09/28/2017 1559   K 3.6 09/28/2017 1559   CL 102 09/28/2017 1559   CO2 24 09/28/2017 1559   GLUCOSE 130 (H) 09/28/2017 1559   BUN 11 09/28/2017 1559   CREATININE 0.84 09/28/2017 1559    CALCIUM 11.0 (H) 09/28/2017 1559   GFRNONAA >60 09/24/2017 0646   GFRAA >60 09/24/2017 0646    BNP    Component Value Date/Time   BNP 310.4 (H) 09/16/2017 2005    ProBNP No results found for: PROBNP  Imaging: No results found.   Assessment & Plan:   COPD GOLD  II   Patient's family  brought in all her medications today which were reconciled together, patient was not taking Stiolto regularly. Theres was some confusion about which inhaler she should be using. She had 3-4 rescue inhalers in her bag, two wixela inhalers and one stiolto inhaler.   Plan: - Instructed patient to take Stiolto every day, 2 puffs in the morning  - Ventolin rescue inhaler as needed, take 2 puffs every 4-6 hours for shortness of breath or wheezing   Chronic respiratory failure with hypoxia ([place]) - Received new POC - O2 97% 2L today   FU in 2-3 months  Martyn Ehrich, NP 01/09/2018

## 2018-01-09 NOTE — Patient Instructions (Addendum)
  COPD medication: - Take Stiolto every day- 2 puffs in the morning  - Use Albuterol/Ventolin rescue inhaler only as needed - take 2 puffs every 4-6 hours for shortness of breath or wheezing   Oxygen:  - Wear 1-2L oxygen to keep O2 >90% during the day and on exertion  - Wear 2L oxygen at night   Follow-up: - 2-3 months with Dr. Melvyn Novas and as needed

## 2018-01-09 NOTE — Assessment & Plan Note (Addendum)
Patient's family brought in all her medications today which were reconciled together, patient was not taking Stiolto regularly. Theres was some confusion about which inhaler she should be using. She had 3-4 rescue inhalers in her bag, two wixela inhalers and one stiolto inhaler.   Plan: - Instructed patient to take Stiolto every day, 2 puffs in the morning  - Ventolin rescue inhaler as needed, take 2 puffs every 4-6 hours for shortness of breath or wheezing

## 2018-01-10 NOTE — Progress Notes (Signed)
Chart and office note reviewed in detail  > agree with a/p as outlined

## 2018-01-12 ENCOUNTER — Other Ambulatory Visit: Payer: Self-pay | Admitting: Primary Care

## 2018-01-12 DIAGNOSIS — J449 Chronic obstructive pulmonary disease, unspecified: Secondary | ICD-10-CM

## 2018-01-14 ENCOUNTER — Ambulatory Visit (INDEPENDENT_AMBULATORY_CARE_PROVIDER_SITE_OTHER): Payer: Medicare Other | Admitting: Family Medicine

## 2018-01-14 ENCOUNTER — Other Ambulatory Visit: Payer: Self-pay | Admitting: Internal Medicine

## 2018-01-14 ENCOUNTER — Encounter: Payer: Self-pay | Admitting: Family Medicine

## 2018-01-14 VITALS — BP 154/74 | HR 80 | Temp 97.6°F | Resp 18 | Ht 65.0 in | Wt 118.0 lb

## 2018-01-14 DIAGNOSIS — I10 Essential (primary) hypertension: Secondary | ICD-10-CM

## 2018-01-14 DIAGNOSIS — J441 Chronic obstructive pulmonary disease with (acute) exacerbation: Secondary | ICD-10-CM

## 2018-01-14 MED ORDER — IPRATROPIUM-ALBUTEROL 0.5-2.5 (3) MG/3ML IN SOLN
3.0000 mL | Freq: Once | RESPIRATORY_TRACT | Status: AC
  Administered 2018-01-14: 3 mL via RESPIRATORY_TRACT

## 2018-01-14 MED ORDER — AZITHROMYCIN 250 MG PO TABS: ORAL_TABLET | ORAL | 0 refills | Status: DC

## 2018-01-14 MED ORDER — TIOTROPIUM BROMIDE-OLODATEROL 2.5-2.5 MCG/ACT IN AERS: 2.0000 | INHALATION_SPRAY | Freq: Every day | RESPIRATORY_TRACT | 1 refills | Status: DC

## 2018-01-14 MED ORDER — PREDNISONE 20 MG PO TABS: 40.0000 mg | ORAL_TABLET | Freq: Every day | ORAL | 0 refills | Status: AC

## 2018-01-14 NOTE — Assessment & Plan Note (Signed)
BP high, recommend PCP follow-up in 1-2 weeks for COPD and BP as it has been high for ~1-2 months

## 2018-01-14 NOTE — Patient Instructions (Signed)
#  COPD Exacerbation - Take your Albuterol (Rescue Inhaler) every 4-6 hours - Start Prednisone (higher dose x 5 days)  - Start Azithromycin Antibiotic (5 days) - If you are continuing to feel short of breath tomorrow without improvement -- Consider going to the ER   #Parathyroid Hormone - it looks like you were suppose to get surgery - I will send a message to [name], NP to follow-up with you about this

## 2018-01-14 NOTE — Progress Notes (Signed)
Subjective:     Denise [name] is a 73 y.o. female presenting for Cough (x 3 months. Feels ery short of breath, fatigue. has bee ntaking Mucinex.)     Cough  This is a chronic problem. The current episode started 1 to 4 weeks ago. The problem has been gradually worsening. The cough is non-productive. Associated symptoms include rhinorrhea, shortness of breath and wheezing. Pertinent negatives include no chest pain, chills, fever, nasal congestion or sore throat. Her past medical history is significant for COPD.   - Has tried Mucinex, cough syrup - Ran out of Stiolto - but got it refill - using the rescue inhaler about 2 times a day  Typically using O2 at night and now all the time    Review of Systems  Constitutional: Negative for chills and fever.  HENT: Positive for rhinorrhea. Negative for sore throat.   Respiratory: Positive for cough, shortness of breath and wheezing.   Cardiovascular: Negative for chest pain.    01/09/2018: Pulm - not taking Stiolto daily - patient education 12/19/2017: Pulm - Prednisone 10 mg x 2 daily and reduce to 10 mg daily. Stiolto daily, 2L O2 at night  Social History   Tobacco Use  Smoking Status Former Smoker  . Packs/day: 1.00  . Years: 30.00  . Pack years: 30.00  . Types: Cigarettes  . Last attempt to quit: 1989  . Years since quitting: 31.0  Smokeless Tobacco Never Used        Objective:    BP Readings from Last 3 Encounters:  01/14/18 (!) 154/74  01/09/18 (!) 148/92  12/19/17 (!) 154/88   Wt Readings from Last 3 Encounters:  01/14/18 118 lb (53.5 kg)  01/09/18 116 lb (52.6 kg)  12/19/17 112 lb (50.8 kg)    BP (!) 154/74   Pulse 80   Temp 97.6 F (36.4 C)   Resp 18   Ht _0  (1.651 m)   Wt 118 lb (53.5 kg)   SpO2 90% Comment: on 2 liters of oxygen right now  BMI 19.64 kg/m    Physical Exam Constitutional:      General: She is not in acute distress.    Appearance: She is not ill-appearing.  HENT:   Head: Normocephalic and atraumatic.     Right Ear: External ear normal.     Left Ear: External ear normal.     Nose: Nose normal.     Mouth/Throat:     Mouth: Mucous membranes are moist.  Eyes:     Extraocular Movements: Extraocular movements intact.     Conjunctiva/sclera: Conjunctivae normal.  Neck:     Musculoskeletal: Normal range of motion and neck supple.  Cardiovascular:     Rate and Rhythm: Normal rate and regular rhythm.     Heart sounds: Murmur present.  Pulmonary:     Effort: Tachypnea and prolonged expiration present. No accessory muscle usage or retractions.     Breath sounds: Decreased air movement present. Wheezing present. No rhonchi or rales.     Comments: Sating 90% on 2L.  Lymphadenopathy:     Cervical: No cervical adenopathy.  Neurological:     Mental Status: She is alert.           Assessment & Plan:   Problem List Items Addressed This Visit      Cardiovascular and Mediastinum   Essential hypertension    BP high, recommend PCP follow-up in 1-2 weeks for COPD and BP as it has been high  for ~1-2 months       Other Visit Diagnoses    COPD with acute exacerbation ([place])    -  Primary   Relevant Medications   ipratropium-albuterol (DUONEB) 0.5-2.5 (3) MG/3ML nebulizer solution 3 mL (Completed)   predniSONE (DELTASONE) 20 MG tablet   azithromycin (ZITHROMAX) 250 MG tablet     Received nebulizer treatment with some improvement in symptoms.   Will treat as exacerbation. Currently using home O2 with some increase WOB.   Discussed strict ER precautions - including worsening symptoms, need for more O2, or no improvement with oral medications     Return in about 1 week (around 01/21/2018), or if symptoms worsen or fail to improve.  Lesleigh Noe, MD

## 2018-01-14 NOTE — Telephone Encounter (Signed)
Routing to Pulmonary since they are managing COPD/ pulmonary issues

## 2018-01-22 ENCOUNTER — Other Ambulatory Visit: Payer: Self-pay | Admitting: Internal Medicine

## 2018-01-30 ENCOUNTER — Ambulatory Visit (INDEPENDENT_AMBULATORY_CARE_PROVIDER_SITE_OTHER): Payer: Medicare Other | Admitting: Endocrinology

## 2018-01-30 ENCOUNTER — Encounter: Payer: Self-pay | Admitting: Endocrinology

## 2018-01-30 VITALS — BP 130/80 | HR 77 | Ht 65.0 in | Wt 121.4 lb

## 2018-01-30 DIAGNOSIS — E210 Primary hyperparathyroidism: Secondary | ICD-10-CM | POA: Diagnosis not present

## 2018-01-30 LAB — VITAMIN D 25 HYDROXY (VIT D DEFICIENCY, FRACTURES): VITD: 50.15 ng/mL (ref 30.00–100.00)

## 2018-01-30 LAB — TSH: TSH: 0.42 u[IU]/mL (ref 0.35–4.50)

## 2018-01-30 LAB — T4, FREE: FREE T4: 0.85 ng/dL (ref 0.60–1.60)

## 2018-01-30 NOTE — Patient Instructions (Signed)
Blood tests are requested for you today.  We'll let you know about the results.  Based on the results, I'll prescribe for you a pill to lower the calcium.    Please come back for a follow-up appointment in 2 weeks.

## 2018-01-30 NOTE — Progress Notes (Signed)
Subjective:    Patient ID: Denise [name], female    DOB: 1945-01-20, 73 y.o.   MRN: [id number]  HPI  Pt returns for f/u of primary hyperparathyroidism (dx'ed 2017; she has never had osteoporosis, urolithiasis, or bony fracture).  Sestamibi scan in 2019 did not locate a parathyroid adenoma, but suggested MNG at the neck.  surg decided she was not an operative candidate.  She has slight constipation, but no assoc muscle weakness.   Past Medical History:  Diagnosis Date  . Altered mental status   . Anxiety and depression   . Colon cancer ([place]) 1988   Resected  . COPD (chronic obstructive pulmonary disease) ([place])   . Diabetes mellitus without complication ([name])    diet controlled- no meds. per pt  . Diverticulosis   . Hyperlipidemia   . Hypertension   . Insomnia   . Primary hyperparathyroidism ([place])   . Tubular adenoma of rectum 02/23/2013   low grade  . Vitamin D deficiency     Past Surgical History:  Procedure Laterality Date  . ABDOMINAL HYSTERECTOMY  08/2015  . BREAST BIOPSY Left   . BREAST EXCISIONAL BIOPSY Right   . COLON RESECTION  1980's  . COLONOSCOPY    . ENDOMETRIAL BIOPSY  2009   negative  . INCONTINENCE SURGERY  2017  . RIGHT HEART CATH N/A 09/20/2017   Procedure: RIGHT HEART CATH;  Surgeon: Larey Dresser, MD;  Location: [facility];  Service: Cardiovascular;  Laterality: N/A;    Social History   Socioeconomic History  . Marital status: Married    Spouse name: Not on file  . Number of children: 3  . Years of education: Not on file  . Highest education level: Not on file  Occupational History  . Not on file  Social Needs  . Financial resource strain: Not on file  . Food insecurity:    Worry: Not on file    Inability: Not on file  . Transportation needs:    Medical: Not on file    Non-medical: Not on file  Tobacco Use  . Smoking status: Former Smoker    Packs/day: 1.00    Years: 30.00    Pack years: 30.00    Types: Cigarettes   Last attempt to quit: 1989    Years since quitting: 31.1  . Smokeless tobacco: Never Used  Substance and Sexual Activity  . Alcohol use: No  . Drug use: No  . Sexual activity: Yes    Partners: Male    Birth control/protection: Post-menopausal, Surgical  Lifestyle  . Physical activity:    Days per week: Not on file    Minutes per session: Not on file  . Stress: Not on file  Relationships  . Social connections:    Talks on phone: Not on file    Gets together: Not on file    Attends religious service: Not on file    Active member of club or organization: Not on file    Attends meetings of clubs or organizations: Not on file    Relationship status: Not on file  . Intimate partner violence:    Fear of current or ex partner: Not on file    Emotionally abused: Not on file    Physically abused: Not on file    Forced sexual activity: Not on file  Other Topics Concern  . Not on file  Social History Narrative   Married. 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 famotidine (PEPCID) 40 MG tablet TAKE 1 TABLET BY MOUTH TWICE A DAY 180 tablet 0  . Fluticasone-Salmeterol (WIXELA INHUB) 250-50 MCG/DOSE AEPB Inhale 1 puff into the lungs 2 (two) times daily.    [name] losartan (COZAAR) 100 MG tablet Take 1 tablet by mouth once daily for blood pressure. 90 tablet 0  . metoprolol tartrate (LOPRESSOR) 25 MG tablet TAKE 1 TABLET BY MOUTH TWICE A DAY 180 tablet 0  . mirtazapine (REMERON) 15 MG tablet Take 15 mg by mouth at bedtime.    [name] omeprazole (PRILOSEC) 20 MG capsule Take 20 mg by mouth daily.    . OXYGEN 2lpm with exertion as needed    . Tiotropium Bromide-Olodaterol (STIOLTO RESPIMAT) 2.5-2.5 MCG/ACT AERS Inhale 2 puffs into the lungs daily. 1 Inhaler 1  . VENTOLIN HFA 108 (90 Base) MCG/ACT inhaler INHALE 1-2 PUFFS INTO  THE LUNGS EVERY 4 HOURS AS NEEDED FOR WHEEZING OR SHORTNESS OF BREATH. 18 Inhaler 0   No current facility-administered medications on file prior to visit.     No Known Allergies  Family History  Problem Relation Age of Onset  . Ovarian cancer Mother   . Breast cancer Neg Hx   . Hyperparathyroidism Neg Hx   . Colon cancer Neg Hx   . Esophageal cancer Neg Hx   . Stomach cancer Neg Hx     BP 130/80 (BP Location: Right Arm, Patient Position: Sitting, Cuff Size: Normal)   Pulse 77   Ht _0  (1.651 m)   Wt 121 lb 6.4 oz (55.1 kg)   SpO2 (!) 89% Comment: 2L O2  BMI 20.20 kg/m   Review of Systems She has fatigue, but she denies numbness.      Objective:   Physical Exam VITAL SIGNS:  See vs page GENERAL: no distress.  Has 02 on. NECK: There is no palpable thyroid enlargement.  No thyroid nodule is palpable.  No palpable lymphadenopathy at the anterior neck.   Lab Results  Component Value Date   PTH 123 (H) 06/07/2017   CALCIUM 11.0 (H) 09/28/2017        Assessment & Plan:  MNG: new.  Check TFT.  Hyperparathyroidism.  i'll rx cinacalcet.   Patient Instructions  Blood tests are requested for you today.  We'll let you know about the results.  Based on the results, I'll prescribe for you a pill to lower the calcium.    Please come back for a follow-up appointment in 2 weeks.

## 2018-01-31 ENCOUNTER — Other Ambulatory Visit: Payer: Self-pay | Admitting: Internal Medicine

## 2018-01-31 LAB — PTH, INTACT AND CALCIUM
Calcium: 11.1 mg/dL — ABNORMAL HIGH (ref 8.6–10.4)
PTH: 80 pg/mL — ABNORMAL HIGH (ref 14–64)

## 2018-01-31 MED ORDER — CINACALCET HCL 30 MG PO TABS: 30.0000 mg | ORAL_TABLET | ORAL | 2 refills | Status: DC

## 2018-02-06 ENCOUNTER — Other Ambulatory Visit: Payer: Self-pay | Admitting: Primary Care

## 2018-02-06 ENCOUNTER — Telehealth: Payer: Self-pay

## 2018-02-06 DIAGNOSIS — J449 Chronic obstructive pulmonary disease, unspecified: Secondary | ICD-10-CM

## 2018-02-06 NOTE — Telephone Encounter (Signed)
Pt called to inform that her out of pocket expense for Sensipar is not affordable. Requested further options/alternatives. After further discussion with Dr. Loanne Drilling, there is no other medication to treat Primary Hyperparathyroidism. Called [facility] to determine if there is funding available for pt. Advised pt call [phone] to establish with them, they will provide her with a code that she will take to the pharmacy each time she needs a refill.  Called pt and made her aware of above information. Verbalized acceptance and understanding.

## 2018-02-13 ENCOUNTER — Encounter: Payer: Self-pay | Admitting: Endocrinology

## 2018-02-13 ENCOUNTER — Ambulatory Visit (INDEPENDENT_AMBULATORY_CARE_PROVIDER_SITE_OTHER): Payer: Medicare HMO | Admitting: Endocrinology

## 2018-02-13 ENCOUNTER — Other Ambulatory Visit: Payer: Self-pay | Admitting: Primary Care

## 2018-02-13 VITALS — BP 152/70 | HR 53 | Ht 65.0 in | Wt 125.4 lb

## 2018-02-13 DIAGNOSIS — E210 Primary hyperparathyroidism: Secondary | ICD-10-CM

## 2018-02-13 LAB — BASIC METABOLIC PANEL
BUN: 20 mg/dL (ref 6–23)
CO2: 32 mEq/L (ref 19–32)
Calcium: 9.7 mg/dL (ref 8.4–10.5)
Chloride: 102 mEq/L (ref 96–112)
Creatinine, Ser: 0.93 mg/dL (ref 0.40–1.20)
GFR: 71.57 mL/min (ref >60.00)
Glucose, Bld: 280 mg/dL — ABNORMAL HIGH (ref 70–99)
Potassium: 3 mEq/L — ABNORMAL LOW (ref 3.5–5.1)
Sodium: 144 mEq/L (ref 135–145)

## 2018-02-13 NOTE — Progress Notes (Signed)
Subjective:    Patient ID: Denise [name], female    DOB: [birth year], 73 y.o.   MRN: [id number]  HPI Pt returns for f/u of primary hyperparathyroidism (dx'ed 2017; she has never had osteoporosis, urolithiasis, or bony fracture; sestamibi scan in 2019 did not locate a parathyroid adenoma, but suggested MNG at the neck; surg decided she was not an operative candidate). She takes sensipar as rx'ed.  No new sxs.  She does not take a vit-D supplement.   Past Medical History:  Diagnosis Date  . Altered mental status   . Anxiety and depression   . Colon cancer ([place]) 1988   Resected  . COPD (chronic obstructive pulmonary disease) ([place])   . Diabetes mellitus without complication ([name])    diet controlled- no meds. per pt  . Diverticulosis   . Hyperlipidemia   . Hypertension   . Insomnia   . Primary hyperparathyroidism ([place])   . Tubular adenoma of rectum 02/23/2013   low grade  . Vitamin D deficiency     Past Surgical History:  Procedure Laterality Date  . ABDOMINAL HYSTERECTOMY  08/2015  . BREAST BIOPSY Left   . BREAST EXCISIONAL BIOPSY Right   . COLON RESECTION  1980's  . COLONOSCOPY    . ENDOMETRIAL BIOPSY  2009   negative  . INCONTINENCE SURGERY  2017  . RIGHT HEART CATH N/A 09/20/2017   Procedure: RIGHT HEART CATH;  Surgeon: Larey Dresser, MD;  Location: [facility];  Service: Cardiovascular;  Laterality: N/A;    Social History   Socioeconomic History  . Marital status: Married    Spouse name: Not on file  . Number of children: 3  . Years of education: Not on file  . Highest education level: Not on file  Occupational History  . Not on file  Social Needs  . Financial resource strain: Not on file  . Food insecurity:    Worry: Not on file    Inability: Not on file  . Transportation needs:    Medical: Not on file    Non-medical: Not on file  Tobacco Use  . Smoking status: Former Smoker    Packs/day: 1.00    Years: 30.00    Pack years: 30.00   Types: Cigarettes    Last attempt to quit: 1989    Years since quitting: 31.1  . Smokeless tobacco: Never Used  Substance and Sexual Activity  . Alcohol use: No  . Drug use: No  . Sexual activity: Yes    Partners: Male    Birth control/protection: Post-menopausal, Surgical  Lifestyle  . Physical activity:    Days per week: Not on file    Minutes per session: Not on file  . Stress: Not on file  Relationships  . Social connections:    Talks on phone: Not on file    Gets together: Not on file    Attends religious service: Not on file    Active member of club or organization: Not on file    Attends meetings of clubs or organizations: Not on file    Relationship status: Not on file  . Intimate partner violence:    Fear of current or ex partner: Not on file    Emotionally abused: Not on file    Physically abused: Not on file    Forced sexual activity: Not on file  Other Topics Concern  . Not on file  Social History Narrative   Married. 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 albuterol (PROVENTIL HFA;VENTOLIN HFA) 108 (90 Base) MCG/ACT inhaler INHALE 1-2 PUFFS INTO THE LUNGS EVERY 4 HOURS AS NEEDED FOR WHEEZING OR SHORTNESS OF BREATH. 1 Inhaler 0  . cinacalcet (SENSIPAR) 30 MG tablet Take 1 tablet (30 mg total) by mouth 3 (three) times a week. 13 tablet 2  . famotidine (PEPCID) 40 MG tablet TAKE 1 TABLET BY MOUTH TWICE A DAY 180 tablet 0  . Fluticasone-Salmeterol (WIXELA INHUB) 250-50 MCG/DOSE AEPB Inhale 1 puff into the lungs 2 (two) times daily.    [name] losartan (COZAAR) 100 MG tablet Take 1 tablet by mouth once daily for blood pressure. 90 tablet 0  . metoprolol tartrate (LOPRESSOR) 25 MG tablet TAKE 1 TABLET BY MOUTH TWICE A DAY 180 tablet 0  . omeprazole (PRILOSEC) 20 MG capsule Take 20 mg by mouth  daily.    . OXYGEN 2lpm with exertion as needed    . predniSONE (DELTASONE) 10 MG tablet TAKE 2 TABLETS EACH MORNING WITH BREAKFAST ONCE BETTER JUST TAKE 1 TAB DAILY 100 tablet 0  . Tiotropium Bromide-Olodaterol (STIOLTO RESPIMAT) 2.5-2.5 MCG/ACT AERS Inhale 2 puffs into the lungs daily. 1 Inhaler 1   No current facility-administered medications on file prior to visit.     No Known Allergies  Family History  Problem Relation Age of Onset  . Ovarian cancer Mother   . Breast cancer Neg Hx   . Hyperparathyroidism Neg Hx   . Colon cancer Neg Hx   . Esophageal cancer Neg Hx   . Stomach cancer Neg Hx     BP (!) 152/70 (BP Location: Left Arm, Patient Position: Sitting, Cuff Size: Normal)   Pulse (!) 53   Ht 5' 5" (1.651 m)   Wt 125 lb 6.4 oz (56.9 kg)   SpO2 90%   BMI 20.87 kg/m    Review of Systems Denies muscle cramps.     Objective:   Physical Exam VITAL SIGNS:  See vs page GENERAL: no distress GAIT: normal and steady   Lab Results  Component Value Date   PTH 29 02/13/2018   CALCIUM 10.0 02/13/2018   CALCIUM 9.7 02/13/2018   Lab Results  Component Value Date   CREATININE 0.93 02/13/2018   BUN 20 02/13/2018   NA 144 02/13/2018   K 3.0 (L) 02/13/2018   CL 102 02/13/2018   CO2 32 02/13/2018      Assessment & Plan:  Primary hyperparathyroidism: well-controlled.  Please continue the same medication.   Hypokalemia: new.  I advised f/u with PCP.   Patient Instructions  Blood tests are requested for you today.  We'll let you know about the results.  Please continue the same medication.  Please come back for a follow-up appointment in 1 month.

## 2018-02-13 NOTE — Patient Instructions (Signed)
Blood tests are requested for you today.  We'll let you know about the results.  Please continue the same medication.  Please come back for a follow-up appointment in 1 month.

## 2018-02-14 LAB — PTH, INTACT AND CALCIUM
Calcium: 10 mg/dL (ref 8.6–10.4)
PTH: 29 pg/mL (ref 14–64)

## 2018-02-19 ENCOUNTER — Ambulatory Visit: Payer: Medicare HMO | Admitting: Primary Care

## 2018-02-20 ENCOUNTER — Encounter: Payer: Self-pay | Admitting: Primary Care

## 2018-02-20 ENCOUNTER — Ambulatory Visit (INDEPENDENT_AMBULATORY_CARE_PROVIDER_SITE_OTHER): Payer: Medicare HMO | Admitting: Primary Care

## 2018-02-20 VITALS — BP 148/72 | HR 62 | Temp 97.8°F | Ht 65.0 in | Wt 120.5 lb

## 2018-02-20 DIAGNOSIS — I272 Pulmonary hypertension, unspecified: Secondary | ICD-10-CM

## 2018-02-20 DIAGNOSIS — J9611 Chronic respiratory failure with hypoxia: Secondary | ICD-10-CM | POA: Diagnosis not present

## 2018-02-20 DIAGNOSIS — I10 Essential (primary) hypertension: Secondary | ICD-10-CM

## 2018-02-20 DIAGNOSIS — E210 Primary hyperparathyroidism: Secondary | ICD-10-CM

## 2018-02-20 DIAGNOSIS — Z85038 Personal history of other malignant neoplasm of large intestine: Secondary | ICD-10-CM

## 2018-02-20 DIAGNOSIS — R600 Localized edema: Secondary | ICD-10-CM

## 2018-02-20 DIAGNOSIS — E876 Hypokalemia: Secondary | ICD-10-CM | POA: Diagnosis not present

## 2018-02-20 HISTORY — DX: Localized edema: R60.0

## 2018-02-20 LAB — BASIC METABOLIC PANEL
BUN: 21 mg/dL (ref 6–23)
CO2: 32 mEq/L (ref 19–32)
Calcium: 10 mg/dL (ref 8.4–10.5)
Chloride: 103 mEq/L (ref 96–112)
Creatinine, Ser: 0.91 mg/dL (ref 0.40–1.20)
GFR: 73.39 mL/min (ref >60.00)
Glucose, Bld: 287 mg/dL — ABNORMAL HIGH (ref 70–99)
Potassium: 4.3 mEq/L (ref 3.5–5.1)
Sodium: 146 mEq/L — ABNORMAL HIGH (ref 135–145)

## 2018-02-20 LAB — BRAIN NATRIURETIC PEPTIDE: Pro B Natriuretic peptide (BNP): 1057 pg/mL — ABNORMAL HIGH (ref 0.0–100.0)

## 2018-02-20 NOTE — Assessment & Plan Note (Signed)
Following with endocrinology, not a candidate for surgery. Continue Sensipar. Repeat BMP pending.

## 2018-02-20 NOTE — Patient Instructions (Addendum)
Stop by the lab prior to leaving today. I will notify you of your results once received.   I will contact Dr. Melvyn Novas regarding your prednisone dose.  Follow up with Dr. Melvyn Novas as scheduled.  It was a pleasure to see you today!

## 2018-02-20 NOTE — Assessment & Plan Note (Signed)
Following with pulmonology, not currently on diuretic. Continue losartan and metoprolol as prescribed. Follow-up due in early March 2020.

## 2018-02-20 NOTE — Progress Notes (Signed)
Subjective:    Patient ID: Denise [name], female    DOB: 05/21/1945, 73 y.o.   MRN: [id number]  HPI  Denise [name] is a 73 year old female with a history of hyperparathyroidism, hypertension, pulmonary hypertension, COPD who presents today with a chief complaint of lower extremity edema.  She underwent echocardiogram in September 2019 which showed LVEF of 60-65%, pulmonary hypertension. Also underwent right heart catheterization with normal right and left filling pressures, moderate pulmonary hypertension with high PVR, low cardiac output. She is managed on losartan 100 mg daily and metoprolol tartrate 25 BID.   She saw her endocrinologist on 02/13/18 for follow up of hyperparathyroidism, was told that she was stable. She is compliant to cinacalcet (Sensipar). Is not a candidate for parathyroid surgery. Calcium was 9.7, potassium of 3.0. She was not treated with oral potassium, told to follow up with PCP.  Her swelling is located to the bilateral lower extremities that have been chronic for months.  She is mostly concerned about edema to the bilateral feet which began about 1 month ago.  She did have an episode of palpitations one day last week while standing, this lasted a few seconds.  She's tried compression socks/hose without much improvement.  She does not elevate her extremities when at rest at home.  Some discomfort to her feet, now has noticed an altered gait given edema. She is managed on prednisone 20 mg daily for which she's been taking since late January 2020 per pulmonology.  Review of Systems  Constitutional: Negative for fever.  Respiratory:       Chronic dyspnea  Cardiovascular: Positive for leg swelling. Negative for chest pain and palpitations.  Neurological: Negative for weakness.       Past Medical History:  Diagnosis Date  . Altered mental status   . Anxiety and depression   . Colon cancer ([place]) 1988   Resected  . COPD (chronic obstructive pulmonary  disease) ([place])   . Diabetes mellitus without complication ([name])    diet controlled- no meds. per pt  . Diverticulosis   . Hyperlipidemia   . Hypertension   . Insomnia   . Primary hyperparathyroidism ([place])   . Tubular adenoma of rectum 02/23/2013   low grade  . Vitamin D deficiency      Social History   Socioeconomic History  . Marital status: Married    Spouse name: Not on file  . Number of children: 3  . Years of education: Not on file  . Highest education level: Not on file  Occupational History  . Not on file  Social Needs  . Financial resource strain: Not on file  . Food insecurity:    Worry: Not on file    Inability: Not on file  . Transportation needs:    Medical: Not on file    Non-medical: Not on file  Tobacco Use  . Smoking status: Former Smoker    Packs/day: 1.00    Years: 30.00    Pack years: 30.00    Types: Cigarettes    Last attempt to quit: 1989    Years since quitting: 31.1  . Smokeless tobacco: Never Used  Substance and Sexual Activity  . Alcohol use: No  . Drug use: No  . Sexual activity: Yes    Partners: Male    Birth control/protection: Post-menopausal, Surgical  Lifestyle  . Physical activity:    Days per week: Not on file    Minutes per session: Not on file  .  Stress: Not on file  Relationships  . Social connections:    Talks on phone: Not on file    Gets together: Not on file    Attends religious service: Not on file    Active member of club or organization: Not on file    Attends meetings of clubs or organizations: Not on file    Relationship status: Not on file  . Intimate partner violence:    Fear of current or ex partner: Not on file    Emotionally abused: Not on file    Physically abused: Not on file    Forced sexual activity: Not on file  Other Topics Concern  . Not on file  Social History Narrative   Married. 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 ABDOMINAL HYSTERECTOMY  08/2015  . BREAST BIOPSY Left   . BREAST EXCISIONAL BIOPSY Right   . COLON RESECTION  1980's  . COLONOSCOPY    . ENDOMETRIAL BIOPSY  2009   negative  . INCONTINENCE SURGERY  2017  . RIGHT HEART CATH N/A 09/20/2017   Procedure: RIGHT HEART CATH;  Surgeon: Larey Dresser, MD;  Location: [facility];  Service: Cardiovascular;  Laterality: N/A;    Family History  Problem Relation Age of Onset  . Ovarian cancer Mother   . Breast cancer Neg Hx   . Hyperparathyroidism Neg Hx   . Colon cancer Neg Hx   . Esophageal cancer Neg Hx   . Stomach cancer Neg Hx     No Known Allergies  Current Outpatient Medications on File Prior to Visit  Medication Sig Dispense Refill  . albuterol (PROVENTIL HFA;VENTOLIN HFA) 108 (90 Base) MCG/ACT inhaler INHALE 1-2 PUFFS INTO THE LUNGS EVERY 4 HOURS AS NEEDED FOR WHEEZING OR SHORTNESS OF BREATH. 1 Inhaler 0  . cinacalcet (SENSIPAR) 30 MG tablet Take 1 tablet (30 mg total) by mouth 3 (three) times a week. 13 tablet 2  . famotidine (PEPCID) 40 MG tablet TAKE 1 TABLET BY MOUTH TWICE A DAY 180 tablet 0  . Fluticasone-Salmeterol (WIXELA INHUB) 250-50 MCG/DOSE AEPB Inhale 1 puff into the lungs 2 (two) times daily.    [name] losartan (COZAAR) 100 MG tablet Take 1 tablet by mouth once daily for blood pressure. 90 tablet 0  . metoprolol tartrate (LOPRESSOR) 25 MG tablet TAKE 1 TABLET BY MOUTH TWICE A DAY 180 tablet 0  . mirtazapine (REMERON) 15 MG tablet TAKE 1 TABLET BY MOUTH AT BEDTIME. FOR DEPRESSION AND APPETITE. 90 tablet 0  . omeprazole (PRILOSEC) 20 MG capsule Take 20 mg by mouth daily.    . OXYGEN 2lpm with exertion as needed    . predniSONE (DELTASONE) 10 MG tablet TAKE 2 TABLETS EACH MORNING WITH BREAKFAST ONCE BETTER JUST TAKE 1 TAB DAILY 100 tablet 0  . Tiotropium Bromide-Olodaterol (STIOLTO RESPIMAT) 2.5-2.5 MCG/ACT AERS Inhale 2 puffs into the lungs daily.  1 Inhaler 1   No current facility-administered medications on file prior to visit.     BP (!) 148/72   Pulse 62   Temp 97.8 F (36.6 C) (Oral)   Ht _0  (1.651 m)   Wt 120 lb 8 oz (54.7 kg)   SpO2 90%   BMI 20.05 kg/m    Objective:   Physical Exam  Constitutional: She  appears well-nourished.  Cardiovascular: Normal rate.  Moderate bilateral pedal edema with 2+ pitting.  Pulses intact.  Mild bilateral lower extremity edema excluding feet with trace pitting.  Respiratory: Effort normal and breath sounds normal. She has no wheezes.  Skin: Skin is warm and dry. No erythema.           Assessment & Plan:

## 2018-02-20 NOTE — Assessment & Plan Note (Signed)
Mostly pedal. Suspect this is secondary to prednisone as she has been on 20 mg daily since late January. We will contact pulmonology to get their thoughts, recommend reducing dose if possible. No obvious signs of congestive heart failure, echo and right cardiac cath reviewed from September 2019.  LVEF of 60 to 65% at that time, pulmonary hypertension.  BNP and BMP pending.

## 2018-02-20 NOTE — Assessment & Plan Note (Addendum)
Compliant to supplemental oxygen at 2 L, continue same. Suspect prednisone to be contributing to pedal edema, will contact pulmonology. Continue inhalers as prescribed.  ECG today completed due to palpitations, appears to be in sinus rhythm with atrial ectopic beats and bundle branch block.  This can be seen in the setting of chronic lung disease, reviewed ECG with Dr. Silvio Pate who agrees.  She has a follow-up visit scheduled with cardiology next month.  Return precautions provided.

## 2018-02-20 NOTE — Assessment & Plan Note (Signed)
Due for repeat colonoscopy which has been on held due to diagnosis of hyperparathyroidism. We will contact GI in regards to rescheduling as she is not a candidate for hyperparathyroid surgery. Of note, she was cleared by cardiology in November 2019 for potential surgery.

## 2018-02-21 ENCOUNTER — Telehealth: Payer: Self-pay

## 2018-02-21 ENCOUNTER — Other Ambulatory Visit: Payer: Self-pay | Admitting: Primary Care

## 2018-02-21 DIAGNOSIS — R600 Localized edema: Secondary | ICD-10-CM

## 2018-02-21 MED ORDER — FUROSEMIDE 20 MG PO TABS: 20.0000 mg | ORAL_TABLET | Freq: Every day | ORAL | 0 refills | Status: DC

## 2018-02-21 NOTE — Telephone Encounter (Signed)
Patient would like for [place] to mail her a work note on Monday to be out of work on Friday 03/01/18 for doctors appointment to follow up with Gentry Fitz, NP  Will forward to [place] to see if she can get mailed out to patient prior to her appointment on Friday.  Patient states she will need the note before hand in order to be excused for appointment.

## 2018-02-22 ENCOUNTER — Encounter: Payer: Self-pay | Admitting: Primary Care

## 2018-02-22 NOTE — Telephone Encounter (Signed)
Letter printed and mailed out for patient.

## 2018-02-22 NOTE — Telephone Encounter (Signed)
Message left for patient to return my call.

## 2018-02-25 NOTE — Telephone Encounter (Signed)
Message left for patient to return my call.  

## 2018-02-25 NOTE — Telephone Encounter (Signed)
Patient called back. Notified her the letter was mailed out on Friday 02/22/2018.  She stated that she wondering if Anda Kraft refill the Lasix, she feels that is was helping.

## 2018-02-25 NOTE — Telephone Encounter (Signed)
No additional furosemide at this time, we will re-evaluate her on Friday as scheduled.

## 2018-02-26 NOTE — Telephone Encounter (Signed)
Message left for patient to return my call.

## 2018-02-26 NOTE — Telephone Encounter (Signed)
Spoken and notified patient of [name] comments. Patient verbalized understanding.

## 2018-02-27 ENCOUNTER — Other Ambulatory Visit: Payer: Self-pay | Admitting: Primary Care

## 2018-02-27 DIAGNOSIS — R600 Localized edema: Secondary | ICD-10-CM

## 2018-02-28 DIAGNOSIS — J9611 Chronic respiratory failure with hypoxia: Secondary | ICD-10-CM | POA: Diagnosis not present

## 2018-03-01 ENCOUNTER — Ambulatory Visit (INDEPENDENT_AMBULATORY_CARE_PROVIDER_SITE_OTHER): Payer: Medicare HMO | Admitting: Primary Care

## 2018-03-01 ENCOUNTER — Other Ambulatory Visit: Payer: Medicare HMO

## 2018-03-01 VITALS — BP 142/70 | HR 52 | Temp 97.8°F | Ht 67.0 in | Wt 124.2 lb

## 2018-03-01 DIAGNOSIS — R600 Localized edema: Secondary | ICD-10-CM

## 2018-03-01 LAB — BASIC METABOLIC PANEL
BUN: 22 mg/dL (ref 6–23)
CO2: 36 mEq/L — ABNORMAL HIGH (ref 19–32)
Calcium: 9.9 mg/dL (ref 8.4–10.5)
Chloride: 100 mEq/L (ref 96–112)
Creatinine, Ser: 1.1 mg/dL (ref 0.40–1.20)
GFR: 58.96 mL/min — ABNORMAL LOW (ref >60.00)
GLUCOSE: 257 mg/dL — AB (ref 70–99)
Potassium: 2.9 mEq/L — ABNORMAL LOW (ref 3.5–5.1)
Sodium: 145 mEq/L (ref 135–145)

## 2018-03-01 LAB — BRAIN NATRIURETIC PEPTIDE: Pro B Natriuretic peptide (BNP): 1336 pg/mL — ABNORMAL HIGH (ref 0.0–100.0)

## 2018-03-01 MED ORDER — PREDNISONE 10 MG PO TABS: 5.0000 mg | ORAL_TABLET | Freq: Every day | ORAL | 0 refills | Status: DC

## 2018-03-01 NOTE — Patient Instructions (Addendum)
Stop by the lab prior to leaving today. I will notify you of your results once received.   Reduce your prednisone down to 1/2 tablet daily (this is 5 mg).  Follow up with Dr. Melvyn Novas and your cardiologist as scheduled.  It was a pleasure to see you today!

## 2018-03-01 NOTE — Assessment & Plan Note (Signed)
Doing well on reduced dose of prednisone 10 mg, also with reduction of pedal edema which is mostly secondary to furosemide. Will further reduce prednisone to 5 mg, increase back up to 10 mg if needed. Discussed this with patient and family. Repeat BNP and BMP pending.  Consider additional furosemide if needed. She does have a history of pulmonary hypertension.  Follow up with pulmonology and cardiology as scheduled.

## 2018-03-01 NOTE — Progress Notes (Signed)
Subjective:    Patient ID: Denise [name], female    DOB: [birth year], 73 y.o.   MRN: [id number]  HPI  Denise [name] is a 73 year old female with a history of hyperparathyoridism, chronic respiratory failure, COPD, pulmonary hypertension, hypertension who presents today for follow up of lower extremity edema.  She was last evaluated on 02/20/18 with reports of moderate pedal edema bilaterally since early January 2020. She had been initiated on prednisone 20 mg daily at that time per pulmonology for dyspnea/respiratory failure. Labs that day revealed BNP of 1057. She has had prior cardiology work up September 2019 including echo with normal LVEF and pulmonary hypertension; and right cardiac catheterization which was grossly normal. Her pedal edema was thought by myself to be secondary to prednisone use and quite possibly from pulmonary hypertension so I contacted her pulmonologist and cardiology and cut her prednisone dose to 10 mg and initiated furosemide 20 mg daily x 4 days.  Since her last visit she's been taking prednisone 10 mg and hasn't noticed any difference in her chronic dyspnea. Her pedal edema has decreased, still with moderate edema to left. She completed her furosemide daily as prescribed. She is elevating her legs when resting.    Review of Systems  Respiratory:       Denies increased shortness of breath  Cardiovascular: Positive for leg swelling. Negative for chest pain.  Neurological: Negative for dizziness and headaches.       Past Medical History:  Diagnosis Date  . Altered mental status   . Anxiety and depression   . Colon cancer ([place]) 1988   Resected  . COPD (chronic obstructive pulmonary disease) ([place])   . Diabetes mellitus without complication ([name])    diet controlled- no meds. per pt  . Diverticulosis   . Hyperlipidemia   . Hypertension   . Insomnia   . Primary hyperparathyroidism ([place])   . Tubular adenoma of rectum 02/23/2013   low grade  .  Vitamin D deficiency      Social History   Socioeconomic History  . Marital status: Married    Spouse name: Not on file  . Number of children: 3  . Years of education: Not on file  . Highest education level: Not on file  Occupational History  . Not on file  Social Needs  . Financial resource strain: Not on file  . Food insecurity:    Worry: Not on file    Inability: Not on file  . Transportation needs:    Medical: Not on file    Non-medical: Not on file  Tobacco Use  . Smoking status: Former Smoker    Packs/day: 1.00    Years: 30.00    Pack years: 30.00    Types: Cigarettes    Last attempt to quit: 1989    Years since quitting: 31.1  . Smokeless tobacco: Never Used  Substance and Sexual Activity  . Alcohol use: No  . Drug use: No  . Sexual activity: Yes    Partners: Male    Birth control/protection: Post-menopausal, Surgical  Lifestyle  . Physical activity:    Days per week: Not on file    Minutes per session: Not on file  . Stress: Not on file  Relationships  . Social connections:    Talks on phone: Not on file    Gets together: Not on file    Attends religious service: Not on file    Active member of club or organization: Not on  file    Attends meetings of clubs or organizations: Not on file    Relationship status: Not on file  . Intimate partner violence:    Fear of current or ex partner: Not on file    Emotionally abused: Not on file    Physically abused: Not on file    Forced sexual activity: Not on file  Other Topics Concern  . Not on file  Social History Narrative   Married. 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 ABDOMINAL HYSTERECTOMY  08/2015  . BREAST BIOPSY Left   . BREAST EXCISIONAL BIOPSY Right   . COLON RESECTION  1980's  . COLONOSCOPY    . ENDOMETRIAL BIOPSY  2009   negative  . INCONTINENCE  SURGERY  2017  . RIGHT HEART CATH N/A 09/20/2017   Procedure: RIGHT HEART CATH;  Surgeon: Larey Dresser, MD;  Location: [facility];  Service: Cardiovascular;  Laterality: N/A;    Family History  Problem Relation Age of Onset  . Ovarian cancer Mother   . Breast cancer Neg Hx   . Hyperparathyroidism Neg Hx   . Colon cancer Neg Hx   . Esophageal cancer Neg Hx   . Stomach cancer Neg Hx     No Known Allergies  Current Outpatient Medications on File Prior to Visit  Medication Sig Dispense Refill  . albuterol (PROVENTIL HFA;VENTOLIN HFA) 108 (90 Base) MCG/ACT inhaler INHALE 1-2 PUFFS INTO THE LUNGS EVERY 4 HOURS AS NEEDED FOR WHEEZING OR SHORTNESS OF BREATH. 1 Inhaler 0  . cinacalcet (SENSIPAR) 30 MG tablet Take 1 tablet (30 mg total) by mouth 3 (three) times a week. 13 tablet 2  . famotidine (PEPCID) 40 MG tablet TAKE 1 TABLET BY MOUTH TWICE A DAY 180 tablet 0  . Fluticasone-Salmeterol (WIXELA INHUB) 250-50 MCG/DOSE AEPB Inhale 1 puff into the lungs 2 (two) times daily.    [name] losartan (COZAAR) 100 MG tablet Take 1 tablet by mouth once daily for blood pressure. 90 tablet 0  . metoprolol tartrate (LOPRESSOR) 25 MG tablet TAKE 1 TABLET BY MOUTH TWICE A DAY 180 tablet 0  . mirtazapine (REMERON) 15 MG tablet TAKE 1 TABLET BY MOUTH AT BEDTIME. FOR DEPRESSION AND APPETITE. 90 tablet 0  . omeprazole (PRILOSEC) 20 MG capsule Take 20 mg by mouth daily.    . OXYGEN 2lpm with exertion as needed    . Tiotropium Bromide-Olodaterol (STIOLTO RESPIMAT) 2.5-2.5 MCG/ACT AERS Inhale 2 puffs into the lungs daily. 1 Inhaler 1   No current facility-administered medications on file prior to visit.     BP (!) 142/70   Pulse (!) 52   Temp 97.8 F (36.6 C) (Oral)   Ht _0  (1.702 m)   Wt 124 lb 4 oz (56.4 kg)   SpO2 90%   BMI 19.46 kg/m    Objective:   Physical Exam  Constitutional: She appears well-nourished.  Cardiovascular: Normal rate.  Mild to moderate pedal edema to left side, trace  edema to right side. No pitting. Moderately improved compared to last visit.  Respiratory: Effort normal and breath sounds normal. She has no wheezes.  Skin: Skin is warm and dry.           Assessment & Plan:

## 2018-03-02 DIAGNOSIS — J9611 Chronic respiratory failure with hypoxia: Secondary | ICD-10-CM | POA: Diagnosis not present

## 2018-03-03 ENCOUNTER — Other Ambulatory Visit: Payer: Self-pay | Admitting: Primary Care

## 2018-03-03 DIAGNOSIS — E876 Hypokalemia: Secondary | ICD-10-CM

## 2018-03-03 MED ORDER — POTASSIUM CHLORIDE CRYS ER 20 MEQ PO TBCR
20.0000 meq | EXTENDED_RELEASE_TABLET | Freq: Two times a day (BID) | ORAL | 0 refills | Status: DC
Start: 2018-03-03 — End: 2018-05-22

## 2018-03-04 ENCOUNTER — Telehealth (HOSPITAL_COMMUNITY): Payer: Self-pay

## 2018-03-04 NOTE — Telephone Encounter (Signed)
Pt is scheduled to see Dr. Aundra Dubin on 03/18/2018. Pt is aware of this appointment. Pt also aware of lab results ie. K 2.9.  Pt aware to start potassium 1 tab BIDx5 days.  Pt verbalized understanding.  Script sent to pharmacy by NP Carlis Abbott (see lab result notes on 03/03/2018).

## 2018-03-04 NOTE — Telephone Encounter (Signed)
-----  Message from Larey Dresser, MD sent at 03/04/2018 10:24 AM EST ----- Please make sure she has followup with me or APP soon.

## 2018-03-06 ENCOUNTER — Other Ambulatory Visit: Payer: Self-pay | Admitting: Primary Care

## 2018-03-06 DIAGNOSIS — J449 Chronic obstructive pulmonary disease, unspecified: Secondary | ICD-10-CM

## 2018-03-12 ENCOUNTER — Ambulatory Visit: Payer: Medicare HMO | Admitting: Internal Medicine

## 2018-03-12 ENCOUNTER — Telehealth (HOSPITAL_COMMUNITY): Payer: Self-pay | Admitting: Internal Medicine

## 2018-03-12 ENCOUNTER — Encounter: Payer: Self-pay | Admitting: Internal Medicine

## 2018-03-12 VITALS — BP 164/100 | HR 122 | Ht 67.0 in | Wt 128.0 lb

## 2018-03-12 DIAGNOSIS — J449 Chronic obstructive pulmonary disease, unspecified: Secondary | ICD-10-CM

## 2018-03-12 DIAGNOSIS — R0600 Dyspnea, unspecified: Secondary | ICD-10-CM | POA: Diagnosis not present

## 2018-03-12 DIAGNOSIS — J9611 Chronic respiratory failure with hypoxia: Secondary | ICD-10-CM | POA: Diagnosis not present

## 2018-03-12 DIAGNOSIS — I272 Pulmonary hypertension, unspecified: Secondary | ICD-10-CM

## 2018-03-12 MED ORDER — FUROSEMIDE 20 MG PO TABS: 20.0000 mg | ORAL_TABLET | Freq: Every day | ORAL | 0 refills | Status: DC

## 2018-03-12 MED ORDER — METHYLPREDNISOLONE ACETATE 80 MG/ML IJ SUSP
120.0000 mg | Freq: Once | INTRAMUSCULAR | Status: AC
  Administered 2018-03-12: 120 mg via INTRAMUSCULAR

## 2018-03-12 MED ORDER — TIOTROPIUM BROMIDE-OLODATEROL 2.5-2.5 MCG/ACT IN AERS: 2.0000 | INHALATION_SPRAY | Freq: Every day | RESPIRATORY_TRACT | 0 refills | Status: DC

## 2018-03-12 NOTE — Progress Notes (Signed)
Subjective:     Patient ID: Denise [name], female   DOB: Oct 20, 1945,    MRN: [id number]    Brief patient profile:  36 yobf  Quit smoking 1989 with doe then dx'd with copd in [place] in mid 1990s where lived x 25 years and started on spiriva around 2005-10 and helped some and since 2017 back in [place] and breathing generally better in [place] but not doing as many steps and changed from spiriva to symb but not taking it regulary and no need for rescue inhaler referred to pulmonary clinic 09/11/2016 by Dr   Malena Edman.    History of Present Illness  09/11/2016 1st [facility] office visit/ Wert  Re GOLD II copd  Chief Complaint  Patient presents with  . pulmonary consult    Referred by Dr. Carlis Abbott for COPD. patient states she is out of breath on exertion, denies chest pain and chest tightness  doe = MMRC2 = can't walk a nl pace on a flat grade s sob but does fine slow and flat eg shopping  rec Plan A = Automatic =  symbicort 160 up to 2 every 12 hours Work on inhaler technique:  Plan B = Backup Only use your albuterol (ventolin) as a rescue medication Return in 3 months     10/02/2017 extended post hosp f/u ov/Wert re: copd /clearance for parathyroid surgery Chief Complaint  Patient presents with  . Hospitalization Follow-up    admitted to hospital 09/16/17 until 09/24/17. She was sent home with o2 but not using it much. She states she has occ SOB with exertion. She is using her ventolin inhaler 4 x daily on average.   Dyspnea:  MMRC3 = can't walk 100 yards even at a slow pace at a flat grade s stopping due to sob   Cough: not much at al now  Sleeping: bed flat/ 2 pillows  SABA use: 4 x daily - was less while on symb 160 vs advair 250 though hfa quite poor (see copd a/p)  02:  1-2lpm   rec Plan A = Automatic = symbicort 160 Take 2 puffs first thing in am and then another 2 puffs about 12 hours later.  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 Follow-up    She states she is needing letter releasing her to return to work.  She states her breathing has improved.   Dyspnea:  mb and back flat better since symbicort 160 though hfa quite poor  Cough: none Sleeping: bed flat/ 2 pillows SABA use: rarely since symbicort 02: have it but not using x sometimes hs   rec Plan A = Automatic = Stiolto 2 pffs each am only and no more Symbicort  Work on inhaler technique:  relax and gently blow all the way out then take a nice smooth deep breath back in, triggering the inhaler at same time you start breathing in.  Hold for up to 5 seconds if you can.  Rinse and gargle with water when done Plan B = Backup Only use your albuterol as a rescue medication to be used if you can't catch your breath by resting or doing a relaxed purse lip breathing pattern.  -  The less you use it, the better it will work when you need it. - Ok to use the inhaler up to 2 puffs  every 4 hours if you must but call for appointment if use goes up over your usual need - Don't leave home without it !!  (think of it like the spare tire for your car)  Ok to return to light duty only  Keep previous appt      10/17/2017  f/u ov/Wert re:  GOLD II copd / cor pulmonale - not using 02 as rec  Chief Complaint  Patient presents with  . Follow-up    Breathing is about the same. She has good days and bad days. She is using her albuterol inhaler 2 x daily on average.   Dyspnea:  mb and back with 02 is easier than 02 but still not using it Cough: minimal Sleeping: bed flat/ 2pillows SABA use: sev times a day  02:  Using 2lpm at bedtime and sometimes with activty   rec Continue stiolto 2 pffs each am  02 2lpm at bedtime and 2lpm with any activity  Please see patient  coordinator before you leave today  to schedule GI eval by Carlean Purl for abd pain / wt loss  Please schedule a follow up office visit in 2 weeks with full pfts and all active meds / inahlers in hand   11/01/2017  f/u ov/Wert re:  GOLD II copd with emphysemic features/ chronic hypoxemia/ cor pulmonale  Chief Complaint  Patient presents with  . Follow-up    Breathing is "okay". She is using her albuterol inhaler once daily on average.   Dyspnea:  Still walking mb and back struggle if not wearing 02  Cough: no Sleeping: flat SABA use: once daily / confused with stiolto vs saba  02: prn/ confused with instructions rec Plan A = Automatic = stiolto 2 pffs just in am and 02 at bedtime  Work on inhaler technique:   Wear 02 2lpm with any activity more than room to room  Plan B = Backup Only use your albuterol as a rescue medication   Please schedule a follow up office visit in 6 weeks, call sooner if needed with all medications /inhalers/ solutions in hand so we can verify exactly what you are taking.   Your Nov 8 appt is with cardiology: [phone]    12/19/2017  f/u ov/Wert re: GOLD II spirometry with emphysemic features/ hypoxemia and cor pulmonale  Chief Complaint  Patient presents with  . Follow-up    Breathing has been worse over the past 2 wks. She is getting SOB walking just a few steps. She is not using her o2 as prescribed.   Dyspnea:  Better with 02 than s  Cough: no excess mucus  Sleeping: on side/ 2-3 pillows/ bed flat  SABA use: none 02: wears 02 2lpm at bedtime not using daytime and did not bring what she does have with her to office rec Prednisone 10 mg  X 2 each am with bfast, once better reduce to one daily  No change in stiolto Wear 2lpm at bedtime Please see patient coordinator before you leave today  to schedule best fit per advanced for ambulatory 02 to keep your 02 level over 90%  Please schedule a follow up office visit with NP  in  2 weeks, sooner if needed   with all medications /inhalers/ solutions in hand so we can verify exactly what you are taking. This includes all medications  from all doctors and over the counters   NP 01/09/2018  COPD medication: - Take Stiolto every day- 2 puffs in the morning  - Use Albuterol/Ventolin rescue inhaler only as needed - take 2 puffs every 4-6 hours for shortness of breath or wheezing  Oxygen:  - Wear 1-2L oxygen to keep O2 >90% during the day and on exertion  - Wear 2L oxygen at night  Follow-up: - 2-3 months with Dr. Melvyn Novas and as needed    03/12/2018  f/u ov/Wert re:   GOLD II / cor pulmonale, thoroughly confused with instructions Chief Complaint  Patient presents with  . Follow-up    Breathing worse since the last visit. She is using her albuterol inhaler 2 x per day.   Dyspnea:  Room to room worse since stopped prednisone/ thinks it may have been helping but clear she did not taper it as rec at last ov Cough: no Sleeping: bed flat/ 2-3pillows otherwise can't breathe SABA use: 2 x daily on generic advair  02: 24/7 at 2lpm    No obvious day to day or daytime variability or assoc excess/ purulent sputum or mucus plugs or hemoptysis or cp or chest tightness, subjective wheeze or overt sinus or hb symptoms.   Sleeping as above  without nocturnal  or early am exacerbation  of respiratory  c/o's or need for noct saba. Also denies any obvious fluctuation of symptoms with weather or environmental changes or other aggravating or alleviating factors except as outlined above   No unusual exposure hx or h/o childhood pna/ asthma or knowledge of premature birth.  Current Allergies, Complete Past Medical History, Past Surgical History, Family History, and Social History were reviewed in [employer] record.  ROS  The following are not active complaints unless bolded Hoarseness, sore throat, dysphagia, dental problems, itching, sneezing,  nasal congestion or discharge of excess mucus or  purulent secretions, ear ache,   fever, chills, sweats, unintended wt loss or wt gain, classically pleuritic or exertional cp,  orthopnea pnd or arm/hand swelling  or leg swelling, presyncope, palpitations, abdominal pain, anorexia, nausea, vomiting, diarrhea  or change in bowel habits or change in bladder habits, change in stools or change in urine, dysuria, hematuria,  rash, arthralgias, visual complaints, headache, numbness, weakness or ataxia or problems with walking or coordination,  change in mood or  memory.        Current Meds - NOTE:   Unable to verify as accurately reflecting what pt takes     Medication Sig  . albuterol (PROVENTIL HFA;VENTOLIN HFA) 108 (90 Base) MCG/ACT inhaler INHALE 1-2 PUFFS INTO THE LUNGS EVERY 4 HOURS AS NEEDED FOR WHEEZING OR SHORTNESS OF BREATH.  . famotidine (PEPCID) 40 MG tablet TAKE 1 TABLET BY MOUTH TWICE A DAY  . losartan (COZAAR) 100 MG tablet Take 1 tablet by mouth once daily for blood pressure.  . metoprolol tartrate (LOPRESSOR) 25 MG tablet TAKE 1 TABLET BY MOUTH TWICE A DAY  . mirtazapine (REMERON) 15 MG tablet TAKE 1 TABLET BY MOUTH AT BEDTIME. FOR DEPRESSION AND APPETITE.  [name] omeprazole (PRILOSEC) 20 MG capsule Take 20 mg by mouth daily.  . OXYGEN 2lpm with exertion as needed  . potassium chloride SA (K-DUR,KLOR-CON) 20 MEQ tablet Take 1 tablet (20 mEq total) by mouth 2 (two) times daily. For low potassium.                        Objective:  Physical Exam  amb thin black female chronically ill and easily confused with details of care as is husband   03/12/2018        128  12/19/2017       112  11/01/2017        118  10/17/2017        114  10/05/2017        120   10/02/2017        119   07/27/16 144 lb 12.8 oz (65.7 kg)  06/02/16 136 lb 6.4 oz (61.9 kg)  11/22/15 156 lb 12.8 oz (71.1 kg)    Vital signs reviewed - Note on arrival 02 sats  90% on 2lpm      HEENT: Full dentures/ nl  oropharynx. Nl external ear canals without cough  reflex -  Mild bilateral non-specific turbinate edema     NECK :  without JVD/Nodes/TM/ nl carotid upstrokes bilaterally   LUNGS: no acc muscle use,  Mod barrel  contour chest wall with bilateral  Distant bs s audible wheeze and  without cough on insp or exp maneuver and mod  Hyperresonant  to  percussion bilaterally     CV:  RRR  no s3 or murmur or increase in P2, and  2+ pitting both LE's  ABD:  soft and nontender with pos mid  insp Hoover's  in the supine position. No bruits or organomegaly appreciated, bowel sounds nl  MS:   Nl gait/  ext warm without deformities, calf tenderness, cyanosis or clubbing No obvious joint restrictions   SKIN: warm and dry without lesions    NEURO:  alert, approp, nl sensorium with  no motor or cerebellar deficits apparent.      Labs  reviewed:      Chemistry      Component Value Date/Time   NA 145 03/01/2018 1218   K 2.9 (L) 03/01/2018 1218   CL 100 03/01/2018 1218   CO2 36 (H) 03/01/2018 1218   BUN 22 03/01/2018 1218   CREATININE 1.10 03/01/2018 1218   CREATININE 0.84 09/28/2017 1559      Component Value Date/Time   CALCIUM 9.9 03/01/2018 1218                             Lab Results  Component Value Date   WBC 5.8 09/28/2017   HGB 13.1 09/28/2017   HCT 39.1 09/28/2017   MCV 82.7 09/28/2017   PLT 281 09/28/2017       Lab Results  Component Value Date   TSH 0.42 01/30/2018     Lab Results  Component Value Date   PROBNP 1,336.0 (H) 03/01/2018                Assessment:

## 2018-03-12 NOTE — Patient Instructions (Addendum)
Pulse oximetry is available at [facility] and you should adjust the flow to keep it above 90%  Depomedrol 120 mg IM today  Start back on stiolto 2 pffs each am  Only use your albuterol (either ventolin or proair but not both) as a rescue medication to be used if you can't catch your breath by resting or doing a relaxed purse lip breathing pattern.  - The less you use it, the better it will work when you need it. - Ok to use up to 2 puffs  every 4 hours if you must but call for immediate appointment if use goes up over your usual need - Don't leave home without it !!  (think of it like the spare tire for your car)   Work on inhaler technique:  relax and gently blow all the way out then take a nice smooth deep breath back in, triggering the inhaler at same time you start breathing in.  Hold for up to 5 seconds if you can.  Rinse and gargle with water when done  Add furosemide back at 20 mg daily and this should bring the swelling down  See Tammy NP in  2 weeks with all your medications, even over the counter meds, separated in two separate bags, the ones you take no matter what vs the ones you stop once you feel better and take only as needed when you feel you need them.   Tammy  will generate for you a new user friendly medication calendar that will put [place] all on the same page re: your medication use.     Without this process, it simply isn't possible to assure that we are providing  your outpatient care  with  the attention to detail we feel you deserve.   If we cannot assure that you're getting that kind of care,  then we cannot manage your problem effectively from this clinic.  Once you have seen Tammy and we are sure that we're all on the same page with your medication use she will arrange follow up with me.

## 2018-03-12 NOTE — Assessment & Plan Note (Addendum)
Quit smoking 1989 - Spirometry 09/11/2016  FEV1 1.03 (52%)  Ratio 54 mild curvature, on no rx - 09/11/2016  After extensive coaching HFA effectiveness =    50% from baseline 10% > continue  symb 160 2bid but consider change to bevespi or stiolto  - 10/02/2017  After extensive coaching inhaler device,  effectiveness =    50% from a baseline near 0 > rechallenge with symbicort 160 x 2 bid x 2 weeks then re-group  - 10/05/2017    try stiolto x 2pffs x 2 week sample  - PFT's  11/01/2017  FEV1 0.95 (52 % ) ratio fev1/vc = 69%  p 3 % improvement from saba p ? Stiolto(not sure she used it)  prior to study with DLCO  30/30c % corrects to 59 % for alv volume  With air trapping pattern  confirmed on cxr as well  12/19/2017  After extensive coaching inhaler device,  effectiveness =    90% with smi    Pt is Group B in terms of symptom/risk and laba/lama therefore appropriate rx at this point but has profound ftt so will try pred 20 mg daily to see if there is a reversible component and if improves leave on 10 mg daily until returns  with all meds in hand using a trust but verify approach to confirm accurate Medication  Reconciliation The principal here is that until we are certain that the  patients are doing what we've asked, it makes no sense to ask them to do more.

## 2018-03-12 NOTE — Telephone Encounter (Signed)
Called and left message for patient to call back.  Need to schedule f/u appt with DB per Kevan Rosebush, RN.

## 2018-03-15 ENCOUNTER — Emergency Department (HOSPITAL_COMMUNITY): Payer: Medicare HMO

## 2018-03-15 ENCOUNTER — Encounter (HOSPITAL_COMMUNITY): Payer: Self-pay

## 2018-03-15 ENCOUNTER — Inpatient Hospital Stay (HOSPITAL_COMMUNITY)
Admission: EM | Admit: 2018-03-15 | Discharge: 2018-03-21 | DRG: 286 | Disposition: A | Discharge status: Home Health | Payer: Medicare HMO | Attending: Internal Medicine | Admitting: Internal Medicine

## 2018-03-15 ENCOUNTER — Other Ambulatory Visit: Payer: Self-pay

## 2018-03-15 DIAGNOSIS — R7989 Other specified abnormal findings of blood chemistry: Secondary | ICD-10-CM

## 2018-03-15 DIAGNOSIS — G92 Toxic encephalopathy: Secondary | ICD-10-CM | POA: Diagnosis not present

## 2018-03-15 DIAGNOSIS — R778 Other specified abnormalities of plasma proteins: Secondary | ICD-10-CM | POA: Diagnosis not present

## 2018-03-15 DIAGNOSIS — J181 Lobar pneumonia, unspecified organism: Secondary | ICD-10-CM

## 2018-03-15 DIAGNOSIS — I5033 Acute on chronic diastolic (congestive) heart failure: Secondary | ICD-10-CM | POA: Diagnosis present

## 2018-03-15 DIAGNOSIS — F419 Anxiety disorder, unspecified: Secondary | ICD-10-CM | POA: Diagnosis present

## 2018-03-15 DIAGNOSIS — J9621 Acute and chronic respiratory failure with hypoxia: Secondary | ICD-10-CM

## 2018-03-15 DIAGNOSIS — J440 Chronic obstructive pulmonary disease with acute lower respiratory infection: Secondary | ICD-10-CM | POA: Diagnosis present

## 2018-03-15 DIAGNOSIS — I110 Hypertensive heart disease with heart failure: Secondary | ICD-10-CM | POA: Diagnosis not present

## 2018-03-15 DIAGNOSIS — J9601 Acute respiratory failure with hypoxia: Secondary | ICD-10-CM | POA: Diagnosis not present

## 2018-03-15 DIAGNOSIS — I2721 Secondary pulmonary arterial hypertension: Secondary | ICD-10-CM | POA: Diagnosis present

## 2018-03-15 DIAGNOSIS — F329 Major depressive disorder, single episode, unspecified: Secondary | ICD-10-CM | POA: Diagnosis present

## 2018-03-15 DIAGNOSIS — E1169 Type 2 diabetes mellitus with other specified complication: Secondary | ICD-10-CM

## 2018-03-15 DIAGNOSIS — N179 Acute kidney failure, unspecified: Secondary | ICD-10-CM | POA: Diagnosis not present

## 2018-03-15 DIAGNOSIS — E785 Hyperlipidemia, unspecified: Secondary | ICD-10-CM | POA: Diagnosis present

## 2018-03-15 DIAGNOSIS — I340 Nonrheumatic mitral (valve) insufficiency: Secondary | ICD-10-CM | POA: Diagnosis not present

## 2018-03-15 DIAGNOSIS — R9431 Abnormal electrocardiogram [ECG] [EKG]: Secondary | ICD-10-CM | POA: Diagnosis not present

## 2018-03-15 DIAGNOSIS — E118 Type 2 diabetes mellitus with unspecified complications: Secondary | ICD-10-CM | POA: Diagnosis not present

## 2018-03-15 DIAGNOSIS — E854 Organ-limited amyloidosis: Secondary | ICD-10-CM | POA: Diagnosis not present

## 2018-03-15 DIAGNOSIS — Z7952 Long term (current) use of systemic steroids: Secondary | ICD-10-CM

## 2018-03-15 DIAGNOSIS — I509 Heart failure, unspecified: Secondary | ICD-10-CM

## 2018-03-15 DIAGNOSIS — Z79899 Other long term (current) drug therapy: Secondary | ICD-10-CM

## 2018-03-15 DIAGNOSIS — I10 Essential (primary) hypertension: Secondary | ICD-10-CM

## 2018-03-15 DIAGNOSIS — Z85038 Personal history of other malignant neoplasm of large intestine: Secondary | ICD-10-CM

## 2018-03-15 DIAGNOSIS — Z90710 Acquired absence of both cervix and uterus: Secondary | ICD-10-CM

## 2018-03-15 DIAGNOSIS — T380X5A Adverse effect of glucocorticoids and synthetic analogues, initial encounter: Secondary | ICD-10-CM | POA: Diagnosis present

## 2018-03-15 DIAGNOSIS — Z9119 Patient's noncompliance with other medical treatment and regimen: Secondary | ICD-10-CM | POA: Diagnosis not present

## 2018-03-15 DIAGNOSIS — I471 Supraventricular tachycardia: Secondary | ICD-10-CM | POA: Diagnosis present

## 2018-03-15 DIAGNOSIS — R0602 Shortness of breath: Secondary | ICD-10-CM | POA: Diagnosis present

## 2018-03-15 DIAGNOSIS — E210 Primary hyperparathyroidism: Secondary | ICD-10-CM | POA: Diagnosis not present

## 2018-03-15 DIAGNOSIS — E1165 Type 2 diabetes mellitus with hyperglycemia: Secondary | ICD-10-CM | POA: Diagnosis present

## 2018-03-15 DIAGNOSIS — E876 Hypokalemia: Secondary | ICD-10-CM | POA: Diagnosis not present

## 2018-03-15 DIAGNOSIS — J189 Pneumonia, unspecified organism: Secondary | ICD-10-CM | POA: Diagnosis present

## 2018-03-15 DIAGNOSIS — I272 Pulmonary hypertension, unspecified: Secondary | ICD-10-CM | POA: Diagnosis not present

## 2018-03-15 DIAGNOSIS — Z9981 Dependence on supplemental oxygen: Secondary | ICD-10-CM | POA: Diagnosis not present

## 2018-03-15 DIAGNOSIS — R600 Localized edema: Secondary | ICD-10-CM

## 2018-03-15 DIAGNOSIS — J439 Emphysema, unspecified: Secondary | ICD-10-CM | POA: Diagnosis not present

## 2018-03-15 DIAGNOSIS — I082 Rheumatic disorders of both aortic and tricuspid valves: Secondary | ICD-10-CM | POA: Diagnosis present

## 2018-03-15 DIAGNOSIS — J441 Chronic obstructive pulmonary disease with (acute) exacerbation: Secondary | ICD-10-CM | POA: Diagnosis present

## 2018-03-15 DIAGNOSIS — I422 Other hypertrophic cardiomyopathy: Secondary | ICD-10-CM | POA: Diagnosis present

## 2018-03-15 DIAGNOSIS — R109 Unspecified abdominal pain: Secondary | ICD-10-CM | POA: Diagnosis not present

## 2018-03-15 DIAGNOSIS — I5043 Acute on chronic combined systolic (congestive) and diastolic (congestive) heart failure: Secondary | ICD-10-CM | POA: Diagnosis not present

## 2018-03-15 DIAGNOSIS — E1129 Type 2 diabetes mellitus with other diabetic kidney complication: Secondary | ICD-10-CM | POA: Diagnosis not present

## 2018-03-15 DIAGNOSIS — J449 Chronic obstructive pulmonary disease, unspecified: Secondary | ICD-10-CM | POA: Diagnosis not present

## 2018-03-15 DIAGNOSIS — I361 Nonrheumatic tricuspid (valve) insufficiency: Secondary | ICD-10-CM | POA: Diagnosis not present

## 2018-03-15 DIAGNOSIS — I5031 Acute diastolic (congestive) heart failure: Secondary | ICD-10-CM | POA: Diagnosis not present

## 2018-03-15 DIAGNOSIS — Z87891 Personal history of nicotine dependence: Secondary | ICD-10-CM

## 2018-03-15 LAB — COMPREHENSIVE METABOLIC PANEL
ALT: 24 U/L (ref 0–44)
AST: 35 U/L (ref 15–41)
Albumin: 3.3 g/dL — ABNORMAL LOW (ref 3.5–5.0)
Alkaline Phosphatase: 106 U/L (ref 38–126)
Anion gap: 11 (ref 5–15)
BUN: 13 mg/dL (ref 8–23)
CHLORIDE: 104 mmol/L (ref 98–111)
CO2: 28 mmol/L (ref 22–32)
Calcium: 10.6 mg/dL — ABNORMAL HIGH (ref 8.9–10.3)
Creatinine, Ser: 0.94 mg/dL (ref 0.44–1.00)
GFR calc Af Amer: >60 mL/min (ref >60)
GFR calc non Af Amer: >60 mL/min (ref >60)
Glucose, Bld: 287 mg/dL — ABNORMAL HIGH (ref 70–99)
Potassium: 4 mmol/L (ref 3.5–5.1)
Sodium: 143 mmol/L (ref 135–145)
Total Bilirubin: 1.4 mg/dL — ABNORMAL HIGH (ref 0.3–1.2)
Total Protein: 5.5 g/dL — ABNORMAL LOW (ref 6.5–8.1)

## 2018-03-15 LAB — CBC
HEMATOCRIT: 47.6 % — AB (ref 36.0–46.0)
Hemoglobin: 14.5 g/dL (ref 12.0–15.0)
MCH: 26.9 pg (ref 26.0–34.0)
MCHC: 30.5 g/dL (ref 30.0–36.0)
MCV: 88.1 fL (ref 80.0–100.0)
Platelets: 181 10*3/uL (ref 150–400)
RBC: 5.4 MIL/uL — ABNORMAL HIGH (ref 3.87–5.11)
RDW: 14.4 % (ref 11.5–15.5)
WBC: 8.1 10*3/uL (ref 4.0–10.5)
nRBC: 0 % (ref 0.0–0.2)

## 2018-03-15 LAB — POCT I-STAT EG7
Acid-Base Excess: 7 mmol/L — ABNORMAL HIGH (ref 0.0–2.0)
Bicarbonate: 33.2 mmol/L — ABNORMAL HIGH (ref 20.0–28.0)
Calcium, Ion: 1.44 mmol/L — ABNORMAL HIGH (ref 1.15–1.40)
HCT: 28 % — ABNORMAL LOW (ref 36.0–46.0)
HEMOGLOBIN: 9.5 g/dL — AB (ref 12.0–15.0)
O2 SAT: 66 %
PH VEN: 7.394 (ref 7.250–7.430)
Potassium: 3.9 mmol/L (ref 3.5–5.1)
Sodium: 141 mmol/L (ref 135–145)
TCO2: 35 mmol/L — ABNORMAL HIGH (ref 22–32)
pCO2, Ven: 54.2 mmHg (ref 44.0–60.0)
pO2, Ven: 35 mmHg (ref 32.0–45.0)

## 2018-03-15 LAB — PROCALCITONIN: Procalcitonin: <0.1 ng/mL

## 2018-03-15 LAB — CREATININE, SERUM
Creatinine, Ser: 0.91 mg/dL (ref 0.44–1.00)
GFR calc Af Amer: >60 mL/min (ref >60)
GFR calc non Af Amer: >60 mL/min (ref >60)

## 2018-03-15 LAB — TROPONIN I: Troponin I: 0.44 ng/mL (ref <0.03)

## 2018-03-15 LAB — BRAIN NATRIURETIC PEPTIDE: B Natriuretic Peptide: 1914 pg/mL — ABNORMAL HIGH (ref 0.0–100.0)

## 2018-03-15 MED ORDER — SODIUM CHLORIDE 0.9% FLUSH
3.0000 mL | Freq: Two times a day (BID) | INTRAVENOUS | Status: DC
  Administered 2018-03-15 – 2018-03-21 (×12): 3 mL via INTRAVENOUS

## 2018-03-15 MED ORDER — IPRATROPIUM-ALBUTEROL 0.5-2.5 (3) MG/3ML IN SOLN
3.0000 mL | Freq: Four times a day (QID) | RESPIRATORY_TRACT | Status: DC
  Administered 2018-03-15: 3 mL via RESPIRATORY_TRACT

## 2018-03-15 MED ORDER — SODIUM CHLORIDE 0.9% FLUSH: 3.0000 mL | INTRAVENOUS | Status: DC | PRN

## 2018-03-15 MED ORDER — ACETAMINOPHEN 325 MG PO TABS
650.0000 mg | ORAL_TABLET | ORAL | Status: DC | PRN
  Administered 2018-03-18: 650 mg via ORAL
  Filled 2018-03-15: qty 2

## 2018-03-15 MED ORDER — FAMOTIDINE 20 MG PO TABS
40.0000 mg | ORAL_TABLET | Freq: Two times a day (BID) | ORAL | Status: DC
  Administered 2018-03-16 – 2018-03-21 (×10): 40 mg via ORAL
  Filled 2018-03-15 (×11): qty 2

## 2018-03-15 MED ORDER — ONDANSETRON HCL 4 MG/2ML IJ SOLN: 4.0000 mg | Freq: Four times a day (QID) | INTRAMUSCULAR | Status: DC | PRN

## 2018-03-15 MED ORDER — POTASSIUM CHLORIDE CRYS ER 20 MEQ PO TBCR: 20.0000 meq | EXTENDED_RELEASE_TABLET | Freq: Two times a day (BID) | ORAL | Status: DC

## 2018-03-15 MED ORDER — ENOXAPARIN SODIUM 40 MG/0.4ML ~~LOC~~ SOLN
40.0000 mg | SUBCUTANEOUS | Status: DC
  Administered 2018-03-15 – 2018-03-18 (×4): 40 mg via SUBCUTANEOUS
  Filled 2018-03-15 (×4): qty 0.4

## 2018-03-15 MED ORDER — METOPROLOL TARTRATE 25 MG PO TABS: 25.0000 mg | ORAL_TABLET | Freq: Two times a day (BID) | ORAL | Status: DC

## 2018-03-15 MED ORDER — PANTOPRAZOLE SODIUM 40 MG PO TBEC
40.0000 mg | DELAYED_RELEASE_TABLET | Freq: Every day | ORAL | Status: DC
  Administered 2018-03-16 – 2018-03-21 (×6): 40 mg via ORAL
  Filled 2018-03-15 (×6): qty 1

## 2018-03-15 MED ORDER — FUROSEMIDE 10 MG/ML IJ SOLN
20.0000 mg | Freq: Two times a day (BID) | INTRAMUSCULAR | Status: DC
  Administered 2018-03-15 – 2018-03-17 (×5): 20 mg via INTRAVENOUS
  Filled 2018-03-15 (×6): qty 2

## 2018-03-15 MED ORDER — METOPROLOL TARTRATE 25 MG PO TABS
25.0000 mg | ORAL_TABLET | Freq: Two times a day (BID) | ORAL | Status: DC
  Administered 2018-03-16 – 2018-03-18 (×6): 25 mg via ORAL
  Filled 2018-03-15 (×6): qty 1

## 2018-03-15 MED ORDER — PREDNISONE 20 MG PO TABS
40.0000 mg | ORAL_TABLET | Freq: Every day | ORAL | Status: DC
  Administered 2018-03-16 – 2018-03-18 (×3): 40 mg via ORAL
  Filled 2018-03-15 (×4): qty 2

## 2018-03-15 MED ORDER — SODIUM CHLORIDE 0.9 % IV SOLN
250.0000 mL | INTRAVENOUS | Status: DC | PRN
  Administered 2018-03-16: 250 mL via INTRAVENOUS

## 2018-03-15 MED ORDER — IPRATROPIUM-ALBUTEROL 0.5-2.5 (3) MG/3ML IN SOLN
3.0000 mL | Freq: Three times a day (TID) | RESPIRATORY_TRACT | Status: DC
  Administered 2018-03-16 – 2018-03-21 (×13): 3 mL via RESPIRATORY_TRACT
  Filled 2018-03-15 (×15): qty 3

## 2018-03-15 MED ORDER — CINACALCET HCL 30 MG PO TABS
30.0000 mg | ORAL_TABLET | ORAL | Status: DC
  Administered 2018-03-18 – 2018-03-20 (×2): 30 mg via ORAL
  Filled 2018-03-15 (×2): qty 1

## 2018-03-15 MED ORDER — LISINOPRIL 5 MG PO TABS
5.0000 mg | ORAL_TABLET | Freq: Every day | ORAL | Status: DC
  Administered 2018-03-15 – 2018-03-16 (×2): 5 mg via ORAL
  Filled 2018-03-15 (×3): qty 1

## 2018-03-15 MED ORDER — SODIUM CHLORIDE 0.9 % IV SOLN
500.0000 mg | INTRAVENOUS | Status: DC
  Administered 2018-03-15 – 2018-03-16 (×2): 500 mg via INTRAVENOUS
  Filled 2018-03-15 (×2): qty 500

## 2018-03-15 MED ORDER — DOXYCYCLINE HYCLATE 100 MG PO TABS
100.0000 mg | ORAL_TABLET | Freq: Once | ORAL | Status: AC
  Administered 2018-03-15: 100 mg via ORAL
  Filled 2018-03-15: qty 1

## 2018-03-15 MED ORDER — IPRATROPIUM-ALBUTEROL 0.5-2.5 (3) MG/3ML IN SOLN
3.0000 mL | Freq: Once | RESPIRATORY_TRACT | Status: AC
  Administered 2018-03-15: 3 mL via RESPIRATORY_TRACT
  Filled 2018-03-15: qty 3

## 2018-03-15 MED ORDER — SODIUM CHLORIDE 0.9 % IV SOLN
1.0000 g | INTRAVENOUS | Status: DC
  Administered 2018-03-16 – 2018-03-17 (×2): 1 g via INTRAVENOUS
  Filled 2018-03-15 (×2): qty 10

## 2018-03-15 MED ORDER — LOSARTAN POTASSIUM 50 MG PO TABS
100.0000 mg | ORAL_TABLET | Freq: Every day | ORAL | Status: DC
  Administered 2018-03-16 – 2018-03-17 (×2): 100 mg via ORAL
  Filled 2018-03-15 (×2): qty 2

## 2018-03-15 MED ORDER — FUROSEMIDE 10 MG/ML IJ SOLN
40.0000 mg | INTRAMUSCULAR | Status: AC
  Administered 2018-03-15: 40 mg via INTRAVENOUS
  Filled 2018-03-15: qty 4

## 2018-03-15 MED ORDER — ASPIRIN EC 81 MG PO TBEC
81.0000 mg | DELAYED_RELEASE_TABLET | Freq: Every day | ORAL | Status: DC
  Administered 2018-03-15 – 2018-03-21 (×7): 81 mg via ORAL
  Filled 2018-03-15 (×7): qty 1

## 2018-03-15 MED ORDER — SODIUM CHLORIDE 0.9 % IV SOLN: 1.0000 g | INTRAVENOUS | Status: DC

## 2018-03-15 MED ORDER — MIRTAZAPINE 15 MG PO TABS
15.0000 mg | ORAL_TABLET | Freq: Every day | ORAL | Status: DC
  Administered 2018-03-16 – 2018-03-20 (×5): 15 mg via ORAL
  Filled 2018-03-15 (×5): qty 1

## 2018-03-15 MED ORDER — SODIUM CHLORIDE 0.9 % IV SOLN
1.0000 g | Freq: Once | INTRAVENOUS | Status: AC
  Administered 2018-03-15: 1 g via INTRAVENOUS
  Filled 2018-03-15: qty 10

## 2018-03-15 NOTE — ED Provider Notes (Signed)
[facility] EMERGENCY DEPARTMENT Provider Note   CSN: [id number] Arrival date & time: 03/15/18  1558    History   Chief Complaint Chief Complaint  Patient presents with  . Shortness of Breath    HPI Denise [name] is a 73 y.o. female.     73 year old female with extensive past medical history including COPD on 2 L home oxygen, type 2 diabetes mellitus, hypertension, hyperlipidemia, colon cancer, SBO, pulmonary hypertension who presents with shortness of breath.  Patient has had progressive shortness of breath over the past month but has been much worse today than usual.  Family member states that she was starting to act occasionally confused today which is what prompted them to bring her in.  She is compliant with all of her medicines, states that she ran out of her inhalers about a week ago.  She takes daily prednisone.  She has had some increased lower extremity edema recently and family notes that she has been gaining some weight.  She endorses thought Mia and paroxysmal nocturnal dyspnea.  No associated chest pain.  She has had some sneezing but no fevers, cough, vomiting, or diarrhea.  The history is provided by the patient and a relative.    Past Medical History:  Diagnosis Date  . Altered mental status   . Anxiety and depression   . Colon cancer ([place]) 1988   Resected  . COPD (chronic obstructive pulmonary disease) ([place])   . Diabetes mellitus without complication ([name])    diet controlled- no meds. per pt  . Diverticulosis   . Hyperlipidemia   . Hypertension   . Insomnia   . Primary hyperparathyroidism ([place])   . Tubular adenoma of rectum 02/23/2013   low grade  . Vitamin D deficiency     Patient Active Problem List   Diagnosis Date Noted  . Lower extremity edema 02/20/2018  . Chronic respiratory failure with hypoxia ([place]) 10/02/2017  . Hematuria 09/27/2017  . SBO (small bowel obstruction) ([place])   . Pulmonary hypertension ([name]) c/w cor  pulmonale   . Acute respiratory failure with hypoxia ([place]) 09/17/2017  . Epigastric pain 09/17/2017  . Protein-calorie malnutrition, severe 09/17/2017  . Abnormal CT scan, gastrointestinal tract   . Constipation   . Other ascites   . Hyperparathyroidism, primary ([place]) 07/27/2016  . Loss of appetite 06/02/2016  . COPD GOLD  II   11/22/2015  . Vitamin D deficiency 11/22/2015  . Anxiety and depression 11/22/2015  . Insomnia 11/22/2015  . Altered mental status 11/22/2015  . Hyperlipidemia 11/22/2015  . Essential hypertension 11/22/2015  . History of adenomatous polyp of colon 02/10/2013  . History of colon cancer 06/11/1986    Past Surgical History:  Procedure Laterality Date  . ABDOMINAL HYSTERECTOMY  08/2015  . BREAST BIOPSY Left   . BREAST EXCISIONAL BIOPSY Right   . COLON RESECTION  1980's  . COLONOSCOPY    . ENDOMETRIAL BIOPSY  2009   negative  . INCONTINENCE SURGERY  2017  . RIGHT HEART CATH N/A 09/20/2017   Procedure: RIGHT HEART CATH;  Surgeon: Larey Dresser, MD;  Location: [facility];  Service: Cardiovascular;  Laterality: N/A;     OB History   No obstetric history on file.      Home Medications    Prior to Admission medications   Medication Sig Start Date End Date Taking? Authorizing Provider  albuterol (PROVENTIL HFA;VENTOLIN HFA) 108 (90 Base) MCG/ACT inhaler INHALE 1-2 PUFFS INTO THE LUNGS EVERY  4 HOURS AS NEEDED FOR WHEEZING OR SHORTNESS OF BREATH. Patient taking differently: Inhale 1-2 puffs into the lungs every 4 (four) hours as needed for wheezing or shortness of breath.  03/06/18  Yes [name], NP  cinacalcet (SENSIPAR) 30 MG tablet Take 1 tablet (30 mg total) by mouth 3 (three) times a week. Patient taking differently: Take 30 mg by mouth every Monday, Wednesday, and Friday.  02/01/18  Yes Renato Shin, MD  mirtazapine (REMERON) 15 MG tablet TAKE 1 TABLET BY MOUTH AT BEDTIME. FOR DEPRESSION AND APPETITE. Patient taking differently:  Take 15 mg by mouth at bedtime. For depression and appetite 02/15/18  Yes [name], NP  omeprazole (PRILOSEC) 20 MG capsule Take 20 mg by mouth daily.   Yes [provider]  OXYGEN Inhale 2 L into the lungs continuous.    Yes [provider]  potassium chloride SA (K-DUR,KLOR-CON) 20 MEQ tablet Take 1 tablet (20 mEq total) by mouth 2 (two) times daily. For low potassium. Patient taking differently: Take 20 mEq by mouth daily. For low potassium. 03/03/18  Yes [name], NP  Tiotropium Bromide-Olodaterol (STIOLTO RESPIMAT) 2.5-2.5 MCG/ACT AERS Inhale 2 puffs into the lungs daily. 03/12/18  Yes Tanda Rockers, MD  famotidine (PEPCID) 40 MG tablet TAKE 1 TABLET BY MOUTH TWICE A DAY Patient taking differently: Take 40 mg by mouth daily.  01/22/18   Tanda Rockers, MD  furosemide (LASIX) 20 MG tablet Take 1 tablet (20 mg total) by mouth daily. 03/12/18   Tanda Rockers, MD  losartan (COZAAR) 100 MG tablet Take 1 tablet by mouth once daily for blood pressure. Patient taking differently: Take 100 mg by mouth daily. for blood pressure. 11/01/17   Tanda Rockers, MD  metoprolol tartrate (LOPRESSOR) 25 MG tablet TAKE 1 TABLET BY MOUTH TWICE A DAY Patient taking differently: Take 25 mg by mouth 2 (two) times daily.  01/22/18   Tanda Rockers, MD    Family History Family History  Problem Relation Age of Onset  . Ovarian cancer Mother   . Breast cancer Neg Hx   . Hyperparathyroidism Neg Hx   . Colon cancer Neg Hx   . Esophageal cancer Neg Hx   . Stomach cancer Neg Hx     Social History Social History   Tobacco Use  . Smoking status: Former Smoker    Packs/day: 1.00    Years: 30.00    Pack years: 30.00    Types: Cigarettes    Last attempt to quit: 1989    Years since quitting: 31.2  . Smokeless tobacco: Never Used  Substance Use Topics  . Alcohol use: No  . Drug use: No     Allergies   Patient has no known allergies.   Review of Systems Review of  Systems All other systems reviewed and are negative except that which was mentioned in HPI   Physical Exam Updated Vital Signs BP (!) 180/126   Pulse (!) 57   Temp 97.9 F (36.6 C) (Oral)   Resp 20   Ht _0  (1.702 m)   Wt 56.7 kg   SpO2 96%   BMI 19.58 kg/m   Physical Exam Vitals signs and nursing note reviewed.  Constitutional:      General: She is not in acute distress.    Appearance: She is well-developed.  HENT:     Head: Normocephalic and atraumatic.     Mouth/Throat:     Mouth: Mucous membranes  are moist.     Pharynx: Oropharynx is clear.  Eyes:     Conjunctiva/sclera: Conjunctivae normal.  Neck:     Musculoskeletal: Neck supple.     Vascular: JVD present.  Cardiovascular:     Rate and Rhythm: Normal rate and regular rhythm.     Heart sounds: Murmur present.  Pulmonary:     Breath sounds: No wheezing.     Comments: Very mild dyspnea without respiratory distress; severely diminished breath sounds b/l L>R Abdominal:     General: Bowel sounds are normal. There is no distension.     Palpations: Abdomen is soft.     Tenderness: There is no abdominal tenderness.  Musculoskeletal:     Right lower leg: Edema present.     Left lower leg: Edema present.     Comments: 3+ pitting edema BLE to knees  Skin:    General: Skin is warm and dry.  Neurological:     Mental Status: She is alert and oriented to person, place, and time.     Comments: Fluent speech  Psychiatric:        Judgment: Judgment normal.      ED Treatments / Results  Labs (all labs ordered are listed, but only abnormal results are displayed) Labs Reviewed  COMPREHENSIVE METABOLIC PANEL - Abnormal; Notable for the following components:      Result Value   Glucose, Bld 287 (*)    Calcium 10.6 (*)    Total Protein 5.5 (*)    Albumin 3.3 (*)    Total Bilirubin 1.4 (*)    All other components within normal limits  TROPONIN I - Abnormal; Notable for the following components:   Troponin I 0.44  (*)    All other components within normal limits  POCT I-STAT EG7 - Abnormal; Notable for the following components:   Bicarbonate 33.2 (*)    TCO2 35 (*)    Acid-Base Excess 7.0 (*)    Calcium, Ion 1.44 (*)    HCT 28.0 (*)    Hemoglobin 9.5 (*)    All other components within normal limits  CBC WITH DIFFERENTIAL/PLATELET  BRAIN NATRIURETIC PEPTIDE  I-STAT VENOUS BLOOD GAS, ED    EKG EKG Interpretation  Date/Time:  Friday March 15 2018 16:15:03 EDT Ventricular Rate:  91 PR Interval:    QRS Duration: 92 QT Interval:  435 QTC Calculation: 536 R Axis:   20 Text Interpretation:  Sinus rhythm Borderline repolarization abnormality Prolonged QT interval Baseline wander in lead(s) I mild diffuse flattening of T waves compared to previous, no STEMI Confirmed by Theotis Burrow [phone]) on 03/15/2018 5:35:59 PM   Radiology Dg Chest 2 View  Result Date: 03/15/2018 CLINICAL DATA:  Shortness of breath. Leg edema. EXAM: CHEST - 2 VIEW COMPARISON:  09/18/2017. FINDINGS: Mildly enlarged cardiac silhouette. Interval patchy density in the left lower lobe and lingula. Stable hyperexpansion of the lungs with prominent interstitial markings and peribronchial thickening. Diffuse osteopenia with stable mild upper thoracic vertebral compression deformities. IMPRESSION: 1. Left lower lobe and lingular pneumonia. 2. Stable cardiomegaly and changes of COPD and chronic bronchitis. Electronically Signed   By: Claudie Revering M.D.   On: 03/15/2018 17:33    Procedures .Critical Care Performed by: Sharlett Iles, MD Authorized by: Sharlett Iles, MD   Critical care provider statement:    Critical care time (minutes):  30   Critical care time was exclusive of:  Separately billable procedures and treating other patients   Critical care was  necessary to treat or prevent imminent or life-threatening deterioration of the following conditions:  Respiratory failure   Critical care was time spent personally  by me on the following activities:  Development of treatment plan with patient or surrogate, examination of patient, obtaining history from patient or surrogate, ordering and performing treatments and interventions, ordering and review of laboratory studies, ordering and review of radiographic studies, re-evaluation of patient's condition and review of old charts   (including critical care time)  Medications Ordered in ED Medications  cefTRIAXone (ROCEPHIN) 1 g in sodium chloride 0.9 % 100 mL IVPB (1 g Intravenous New Bag/Given 03/15/18 1821)  ipratropium-albuterol (DUONEB) 0.5-2.5 (3) MG/3ML nebulizer solution 3 mL (3 mLs Nebulization Given 03/15/18 1654)  doxycycline (VIBRA-TABS) tablet 100 mg (100 mg Oral Given 03/15/18 1820)  furosemide (LASIX) injection 40 mg (40 mg Intravenous Given 03/15/18 1820)     Initial Impression / Assessment and Plan / ED Course  I have reviewed the triage vital signs and the nursing notes.  Pertinent labs & imaging results that were available during my care of the patient were reviewed by me and considered in my medical decision making (see chart for details).       No respiratory distress at presentation, initially hypoxic on home oxygen, turned up to 6 L and able to maintain saturations in low 90s.  Volume overloaded on clinical exam with pitting edema and JVD.  Chest x-ray shows left lower lobe and lingular infiltrate with cardiomegaly.  I have given ceftriaxone and doxycycline to cover for infectious process but patient denies any significant cough or URI symptoms, suspect this may be fluid related to CHF.  Lab work notable for normal creatinine, hemoglobin 9.5, troponin 0 0.44.  She denies any significant chest pain.  EKG shows some diffuse T wave flattening with lower voltages compared to old tracings but no deep T wave inversions or ST elevation.  I suspect her elevated troponin is related to demand ischemia rather than ACS given her history.  I have ordered a  dose of IV Lasix and discussed admission with [facility], Dr. Benny Lennert, who will admit for further care.  Final Clinical Impressions(s) / ED Diagnoses   Final diagnoses:  Acute respiratory failure with hypoxia ([name])  Bilateral lower extremity edema  Elevated troponin    ED Discharge Orders    None       Lasean Gorniak, Wenda Overland, MD 03/15/18 1839

## 2018-03-15 NOTE — H&P (Signed)
History and Physical  Denise [name] Plan FGH:[id number] DOB: 25-Sep-1945 DOA: 03/15/2018  PCP: [name], NP   Chief Complaint: Worsening shortness of breath.  HPI:  The patient is a 73 yr old woman who carries a past medical history significant for COPD with chronic respiratory requiring 2L O2, DM II, HTN, hyperlipidemia, colon cancer, SBO, pulmonary hypertension. She states that the shortness of breath actually started about a month ago, but has gotten much worse in the last week. She states that she also has had chest pain and tightness at times. She cannot say if this chest pain was associated with exertion or not. The patient ran out of her inhalers a week ago. She has been taking prednisone daily. She has developed severe edema in her lower extremities bilaterally. She denies fever or chills. She states that she has had a cough and sneeze. There has been no nausea/vomiting, diarrhea or constipation. Today family noted that the patient was confused and brought her to the ED.  ED Course: Once she arrived to the ED the patient had a temperature of 97.9, Blood pressure of 153/102. Heart rate was 78. Respiratory rate was 20. She was saturating 86% on 2 liters.  Sodium was 143. Potassium was 4.0. CO2 was 28, Glucose was 287. BUN was 13. Creatinine was 0.94. Bilirubin was 1.4. Remainder of LFT's were unremarkable. Troponin I was 0.44. Hemoglobin was 9.5. Hematocrit was 28. WBC and platelets are pending.  CXR demonstrated left lingular and lll pneumonia. Stable cardiomegaly and changes related to chronic bronchitis and COPD.  EKG: Prolonged QT, mild diffuse flattening of T waves compared to previous tracings. NSR.  The patient has received Rocephin, doxycycline, and lasix in the ED in addition to nebulizer treatments  Review of Systems:   Negative for fever, visual changes, sore throat, rash, new muscle aches, chest pain, SOB, dysuria, bleeding, n/v/abdominal pain. Positive for those  elements included in the HPI above.  Past Medical History:  Diagnosis Date   Altered mental status    Anxiety and depression    Colon cancer ([place]) 1988   Resected   COPD (chronic obstructive pulmonary disease) ([place])    Diabetes mellitus without complication ([place])    diet controlled- no meds. per pt   Diverticulosis    Hyperlipidemia    Hypertension    Insomnia    Primary hyperparathyroidism ([place])    Tubular adenoma of rectum 02/23/2013   low grade   Vitamin D deficiency     Past Surgical History:  Procedure Laterality Date   ABDOMINAL HYSTERECTOMY  08/2015   BREAST BIOPSY Left    BREAST EXCISIONAL BIOPSY Right    COLON RESECTION  1980's   COLONOSCOPY     ENDOMETRIAL BIOPSY  2009   negative   INCONTINENCE SURGERY  2017   RIGHT HEART CATH N/A 09/20/2017   Procedure: RIGHT HEART CATH;  Surgeon: Larey Dresser, MD;  Location: [facility];  Service: Cardiovascular;  Laterality: N/A;     reports that she quit smoking about 31 years ago. Her smoking use included cigarettes. She has a 30.00 pack-year smoking history. She has never used smokeless tobacco. She reports that she does not drink alcohol or use drugs. Mobility: She uses a rolling walker at home.  No Known Allergies  Family History  Problem Relation Age of Onset   Ovarian cancer Mother    Breast cancer Neg Hx    Hyperparathyroidism Neg Hx    Colon cancer Neg Hx  Esophageal cancer Neg Hx    Stomach cancer Neg Hx      Prior to Admission medications   Medication Sig Start Date End Date Taking? Authorizing Provider  albuterol (PROVENTIL HFA;VENTOLIN HFA) 108 (90 Base) MCG/ACT inhaler INHALE 1-2 PUFFS INTO THE LUNGS EVERY 4 HOURS AS NEEDED FOR WHEEZING OR SHORTNESS OF BREATH. Patient taking differently: Inhale 1-2 puffs into the lungs every 4 (four) hours as needed for wheezing or shortness of breath.  03/06/18  Yes [name], NP  cinacalcet (SENSIPAR) 30 MG tablet Take 1  tablet (30 mg total) by mouth 3 (three) times a week. Patient taking differently: Take 30 mg by mouth every Monday, Wednesday, and Friday.  02/01/18  Yes Renato Shin, MD  mirtazapine (REMERON) 15 MG tablet TAKE 1 TABLET BY MOUTH AT BEDTIME. FOR DEPRESSION AND APPETITE. Patient taking differently: Take 15 mg by mouth at bedtime. For depression and appetite 02/15/18  Yes [name], NP  omeprazole (PRILOSEC) 20 MG capsule Take 20 mg by mouth daily.   Yes [provider]  OXYGEN Inhale 2 L into the lungs continuous.    Yes [provider]  potassium chloride SA (K-DUR,KLOR-CON) 20 MEQ tablet Take 1 tablet (20 mEq total) by mouth 2 (two) times daily. For low potassium. Patient taking differently: Take 20 mEq by mouth daily. For low potassium. 03/03/18  Yes [name], NP  Tiotropium Bromide-Olodaterol (STIOLTO RESPIMAT) 2.5-2.5 MCG/ACT AERS Inhale 2 puffs into the lungs daily. 03/12/18  Yes Tanda Rockers, MD  famotidine (PEPCID) 40 MG tablet TAKE 1 TABLET BY MOUTH TWICE A DAY Patient taking differently: Take 40 mg by mouth daily.  01/22/18   Tanda Rockers, MD  furosemide (LASIX) 20 MG tablet Take 1 tablet (20 mg total) by mouth daily. 03/12/18   Tanda Rockers, MD  losartan (COZAAR) 100 MG tablet Take 1 tablet by mouth once daily for blood pressure. Patient taking differently: Take 100 mg by mouth daily. for blood pressure. 11/01/17   Tanda Rockers, MD  metoprolol tartrate (LOPRESSOR) 25 MG tablet TAKE 1 TABLET BY MOUTH TWICE A DAY Patient taking differently: Take 25 mg by mouth 2 (two) times daily.  01/22/18   Tanda Rockers, MD    Physical Exam: Vitals:   03/15/18 1818 03/15/18 1901  BP: (!) 180/126 (!) 150/90  Pulse: (!) 57   Resp: 20   Temp:    SpO2: 96%     Constitutional:    The patient is awake, alert and oriented x 3. She is in no acute distress. Eyes:   PERRLA, EOMBI, Sclera are non-icteric, non-injected ENMT:   grossly normal hearing    Lips appear normal  external ears, nose appear normal  Oropharynx: mucosa, tongue,posterior pharynx appear normal Neck:   neck appears normal, no masses, normal ROM, supple  no thyromegaly Respiratory:   No wheezes, rales, or rhonchi.   No tactile fremitus  Respiratory effort normal. No retractions or accessory muscle use Cardiovascular:   RRR, no murmurs, ectopy, or gallups       +3-4 pitting edema of lower extremitites bilaterally   Normal pedal pulses Abdomen:   Abdomen Softm, non-tender, non-distended.  Normoactive bowel sounds.  No hernias, masses, or organomegaly is appreciated. Musculoskeletal:   No cyanosis, clubbing, Postive for edema in lower extremities bilaterally. Skin:   No rashes, lesions, ulcers  palpation of skin: no induration or nodules Neurologic:   CN 2-12 intact  Sensation all 4 extremities intact  Psychiatric:   Mental status o Mood, affect appropriate o Orientation to person, place, time   judgment and insight appear intact   I have personally reviewed following labs and imaging studies  Labs:  CBC    Component Value Date/Time   WBC 5.8 09/28/2017 1559   RBC 4.73 09/28/2017 1559   HGB 9.5 (L) 03/15/2018 1703   HCT 28.0 (L) 03/15/2018 1703   PLT 281 09/28/2017 1559   MCV 82.7 09/28/2017 1559   MCH 27.7 09/28/2017 1559   MCHC 33.5 09/28/2017 1559   RDW 13.4 09/28/2017 1559   LYMPHSABS 0.4 (L) 09/17/2017 0350   MONOABS 0.3 09/17/2017 0350   EOSABS 0.0 09/17/2017 0350   BASOSABS 0.0 09/17/2017 0350    BMP Latest Ref Rng & Units 03/15/2018 03/15/2018 03/01/2018  Glucose 70 - 99 mg/dL - 287(H) 257(H)  BUN 8 - 23 mg/dL - 13 22  Creatinine 0.44 - 1.00 mg/dL - 0.94 1.10  BUN/Creat Ratio 6 - 22 (calc) - - -  Sodium 135 - 145 mmol/L 141 143 145  Potassium 3.5 - 5.1 mmol/L 3.9 4.0 2.9(L)  Chloride 98 - 111 mmol/L - 104 100  CO2 22 - 32 mmol/L - 28 36(H)  Calcium 8.9 - 10.3 mg/dL - 10.6(H) 9.9    Medical tests:   EKG  independently reviewed: NSR with flatterened T's lateral and a prolonged QTc.  Test discussed with performing physician:  Dr. Rex Kras  Review and summation of old records:   Last admission 09/16/2017 for shortness of breath. Likely secondary to COPD and pulmonary hypertension.   Active Problems:   Acute exacerbation of CHF (congestive heart failure) ([name])  Assessment/Plan Acute on chronic hypoxic respiratory failure Left lingular and Left lower lobe pneumonia CHF exacerbation: concern for ischemic cause due to elevated Troponin and flattening of T's on EKG.  COPD exacerbation DM II Hypertension  Hyperlipidemia Medical Non-compliance  Acute on chronic hypoxic respiratory failure: The patient will receive IV antibiotics for pneumonia. Procalcitonin has been ordered. She will be diuresed. She will have nebulizer treatments and steroids.  Left lingular pneumonia/LLL pneumonia: IV Rocephin and Azithromycin. I have ordered procalcitonin. Although the patient has CXR findings compatible with pneumonia, she lacks history of fevers, chills, purulent sputum or rales on exam.   CHF exacerbation, elevated troponin, abnormal EKG: Rule out MI by EKG and enzyme criteria. Echocardiogram will be performed to evaluate for wall motion abnormalities and EF. She will be admitted to a telemetry bed.  The patient will be admitted to a progressive care unit. She will be ruled out for MI by EKG and enzyme criteria. She will have an echocardiogram to evaluate wall motion abnormalities and EF.   COPD exacerbation: The patient will receive nebulizer treatments and steroids.   DM II: The patient will have a diabetic diet. Her glucoses are reportedly controlled by diet. They will be followed by FSBS and  SSI.   Hypertension: The patient will be continued on losartan and metoprolol as at home.  Hyperlipidemia: The patient will be continued on her statin as at home.  Medical non-compliance: The patient would  probably benefit from home health nurse to assist with medications.  Severity of Illness: The appropriate patient status for this patient is INPATIENT. Inpatient status is judged to be reasonable and necessary in order to provide the required intensity of service to ensure the patient's safety. The patient's presenting symptoms, physical exam findings, and initial radiographic and laboratory data in the context of their chronic comorbidities  is felt to place them at high risk for further clinical deterioration. Furthermore, it is not anticipated that the patient will be medically stable for discharge from the hospital within 2 midnights of admission. The following factors support the patient status of inpatient.   " The patient's presenting symptoms include shortness of breath, edema, loss of appetite, chest pain " The worrisome physical exam findings include severe lower extremity edema " The initial radiographic and laboratory data are worrisome because of Left lingular and LLL pneumonia " The chronic co-morbidities include DM II, HTN, COPD, Hyperlipidemia, primary hyperparathyroidism, Anxiety and depression  * I certify that at the point of admission it is my clinical judgment that the patient will require inpatient hospital care spanning beyond 2 midnights from the point of admission due to high intensity of service, high risk for further deterioration and high frequency of surveillance required.*  DVT prophylaxis:Lovenox Code Status: Full Code Family Communication: Daughter in room. Consults called: None.  Time spent: 72 minutes  Gabriele Zwilling, DO [facility] Direct contact: see [email or web address]  7PM-7AM contact night coverage as above  03/15/2018, 7:56 PM

## 2018-03-15 NOTE — ED Notes (Signed)
Pt is normally on 2LNC at home. Hx of COPD. On monitor pt's O2 read 78% placed pt on 6LNC. Family stated that her normal is 89%.

## 2018-03-15 NOTE — ED Notes (Signed)
This RN attempted report twice, carehandoff report in chart. Nursing secretary notified of this. This Rn gave detailed report in care hand off report. Pt to be transported up by night shift RN. The floor called back and stated that room is not ready yet but Pt will go up to floor in 20 minutes. Aaron Edelman RN is aware of this.

## 2018-03-15 NOTE — ED Triage Notes (Addendum)
Pt endorses shob, has hx of copd. Pt noted to be hypoxic on 2L [place] that she wears all the time, now up to 6L and spo2 100%. Also complains of generalized chest pain, leg weakness x 3 weeks intermittently.

## 2018-03-15 NOTE — ED Notes (Addendum)
ED TO INPATIENT HANDOFF REPORT  ED Nurse Name and Phone #: Lennette Bihari [phone]  S Name/Age/Gender Denise [name] 73 y.o. female Room/Bed: 025C/025C  Code Status   Code Status: Full Code  Home/SNF/Other Home Patient oriented to: self, place, time and situation Is this baseline? Yes   Triage Complete: Triage complete  Chief Complaint sob  Triage Note Pt endorses shob, has hx of copd. Pt noted to be hypoxic on 2L  that she wears all the time, now up to 6L and spo2 100%. Also complains of generalized chest pain, leg weakness x 3 weeks intermittently.    Allergies No Known Allergies  Level of Care/Admitting Diagnosis ED Disposition    ED Disposition Condition Comment   Admit  Hospital Area: [place] [[id number]]  Level of Care: Telemetry Cardiac [103]  Diagnosis: Acute exacerbation of CHF (congestive heart failure) [facility]) [[id number]]  Admitting Physician: Monna Fam  Attending Physician: Benny Lennert, AVA [place].Pepper  Estimated length of stay: 3 - 4 days  Certification:: I certify this patient will need inpatient services for at least 2 midnights  PT Class (Do Not Modify): Inpatient [101]  PT Acc Code (Do Not Modify): Private [1]       B Medical/Surgery History Past Medical History:  Diagnosis Date  . Altered mental status   . Anxiety and depression   . Colon cancer ([place]) 1988   Resected  . COPD (chronic obstructive pulmonary disease) ([place])   . Diabetes mellitus without complication ([name])    diet controlled- no meds. per pt  . Diverticulosis   . Hyperlipidemia   . Hypertension   . Insomnia   . Primary hyperparathyroidism ([place])   . Tubular adenoma of rectum 02/23/2013   low grade  . Vitamin D deficiency    Past Surgical History:  Procedure Laterality Date  . ABDOMINAL HYSTERECTOMY  08/2015  . BREAST BIOPSY Left   . BREAST EXCISIONAL BIOPSY Right   . COLON RESECTION  1980's  . COLONOSCOPY    . ENDOMETRIAL BIOPSY  2009   negative  .  INCONTINENCE SURGERY  2017  . RIGHT HEART CATH N/A 09/20/2017   Procedure: RIGHT HEART CATH;  Surgeon: Larey Dresser, MD;  Location: [place] CV LAB;  Service: Cardiovascular;  Laterality: N/A;     A IV Location/Drains/Wounds Patient Lines/Drains/Airways Status   Active Line/Drains/Airways    Name:   Placement date:   Placement time:   Site:   Days:   Peripheral IV 03/15/18 Left Hand   03/15/18    1821    Hand   less than 1   External Urinary Catheter   03/15/18    1857    -   less than 1          Intake/Output Last 24 hours  Intake/Output Summary (Last 24 hours) at 03/15/2018 1904 Last data filed at 03/15/2018 1901 Gross per 24 hour  Intake 100 ml  Output 300 ml  Net -200 ml    Labs/Imaging Results for orders placed or performed during the hospital encounter of 03/15/18 (from the past 48 hour(s))  Comprehensive metabolic panel     Status: Abnormal   Collection Time: 03/15/18  4:52 PM  Result Value Ref Range   Sodium 143 135 - 145 mmol/L   Potassium 4.0 3.5 - 5.1 mmol/L   Chloride 104 98 - 111 mmol/L   CO2 28 22 - 32 mmol/L   Glucose, Bld 287 (H) 70 - 99 mg/dL   BUN  13 8 - 23 mg/dL   Creatinine, Ser 0.94 0.44 - 1.00 mg/dL   Calcium 10.6 (H) 8.9 - 10.3 mg/dL   Total Protein 5.5 (L) 6.5 - 8.1 g/dL   Albumin 3.3 (L) 3.5 - 5.0 g/dL   AST 35 15 - 41 U/L   ALT 24 0 - 44 U/L   Alkaline Phosphatase 106 38 - 126 U/L   Total Bilirubin 1.4 (H) 0.3 - 1.2 mg/dL   GFR calc non Af Amer >60 >60 mL/min   GFR calc Af Amer >60 >60 mL/min   Anion gap 11 5 - 15    Comment: Performed at [facility] [address] [place], [place] [address]  Troponin I - Once     Status: Abnormal   Collection Time: 03/15/18  4:52 PM  Result Value Ref Range   Troponin I 0.44 (HH) <0.03 ng/mL    Comment: CRITICAL RESULT CALLED TO, READ BACK BY AND VERIFIED WITH: Orson Gear 1745 03/15/2018 D BRADLEY Performed at [facility] [address] [place], [place] [address]   POCT I-Stat  EG7     Status: Abnormal   Collection Time: 03/15/18  5:03 PM  Result Value Ref Range   pH, Ven 7.394 7.250 - 7.430   pCO2, Ven 54.2 44.0 - 60.0 mmHg   pO2, Ven 35.0 32.0 - 45.0 mmHg   Bicarbonate 33.2 (H) 20.0 - 28.0 mmol/L   TCO2 35 (H) 22 - 32 mmol/L   O2 Saturation 66.0 %   Acid-Base Excess 7.0 (H) 0.0 - 2.0 mmol/L   Sodium 141 135 - 145 mmol/L   Potassium 3.9 3.5 - 5.1 mmol/L   Calcium, Ion 1.44 (H) 1.15 - 1.40 mmol/L   HCT 28.0 (L) 36.0 - 46.0 %   Hemoglobin 9.5 (L) 12.0 - 15.0 g/dL   Patient temperature HIDE    Sample type VENOUS    Comment NOTIFIED PHYSICIAN    Dg Chest 2 View  Result Date: 03/15/2018 CLINICAL DATA:  Shortness of breath. Leg edema. EXAM: CHEST - 2 VIEW COMPARISON:  09/18/2017. FINDINGS: Mildly enlarged cardiac silhouette. Interval patchy density in the left lower lobe and lingula. Stable hyperexpansion of the lungs with prominent interstitial markings and peribronchial thickening. Diffuse osteopenia with stable mild upper thoracic vertebral compression deformities. IMPRESSION: 1. Left lower lobe and lingular pneumonia. 2. Stable cardiomegaly and changes of COPD and chronic bronchitis. Electronically Signed   By: Claudie Revering M.D.   On: 03/15/2018 17:33    Pending Labs Unresulted Labs (From admission, onward)    Start     Ordered   03/22/18 0500  Creatinine, serum  (enoxaparin (LOVENOX)    CrCl >/= 30 ml/min)  Weekly,   R    Comments:  while on enoxaparin therapy    03/15/18 1848   03/16/18 1740  Basic metabolic panel  Daily,   R     03/15/18 1848   03/16/18 0500  Procalcitonin  Daily,   R     03/15/18 1851   03/15/18 2000  Brain natriuretic peptide  Once,   R     03/15/18 2000   03/15/18 1852  Procalcitonin - Baseline  ONCE - STAT,   STAT     03/15/18 1851   03/15/18 1850  HIV antibody (Routine Screening)  Once,   R     03/15/18 1850   03/15/18 1850  Culture, blood (routine x 2) Call MD if unable to obtain prior to antibiotics being given  BLOOD  CULTURE X 2,   R    Comments:  If blood cultures drawn in Emergency Department - Do not draw and cancel order    03/15/18 1850   03/15/18 1850  Culture, sputum-assessment  Once,   R     03/15/18 1850   03/15/18 1850  Gram stain  Once,   R     03/15/18 1850   03/15/18 1850  Strep pneumoniae urinary antigen  Once,   R     03/15/18 1850   03/15/18 1850  Influenza panel by PCR (type A & B)  (Influenza PCR Panel)  Once,   R     03/15/18 1850   03/15/18 1850  Legionella Pneumophila Serogp 1 Ur Ag  Once,   R     03/15/18 1850   03/15/18 1836  CBC  (enoxaparin (LOVENOX)    CrCl >/= 30 ml/min)  Once,   R    Comments:  Baseline for enoxaparin therapy IF NOT ALREADY DRAWN.  Notify MD if PLT < 100 K.    03/15/18 1848   03/15/18 1836  Creatinine, serum  (enoxaparin (LOVENOX)    CrCl >/= 30 ml/min)  Once,   R    Comments:  Baseline for enoxaparin therapy IF NOT ALREADY DRAWN.    03/15/18 1848   03/15/18 1625  CBC with Differential  Once,   STAT     03/15/18 1624          Vitals/Pain Today's Vitals   03/15/18 1800 03/15/18 1818 03/15/18 1821 03/15/18 1901  BP:  (!) 180/126  (!) 150/90  Pulse: 70 (!) 57    Resp: (!) 22 20    Temp:      TempSrc:      SpO2: 95% 96%    Weight:   56.7 kg   Height:   _0  (1.702 m)   PainSc:        Isolation Precautions Droplet and Contact precautions  Medications Medications  sodium chloride flush (NS) 0.9 % injection 3 mL (has no administration in time range)  sodium chloride flush (NS) 0.9 % injection 3 mL (has no administration in time range)  0.9 %  sodium chloride infusion (has no administration in time range)  acetaminophen (TYLENOL) tablet 650 mg (has no administration in time range)  ondansetron (ZOFRAN) injection 4 mg (has no administration in time range)  enoxaparin (LOVENOX) injection 40 mg (has no administration in time range)  furosemide (LASIX) injection 20 mg (has no administration in time range)  lisinopril (PRINIVIL,ZESTRIL)  tablet 5 mg (has no administration in time range)  aspirin EC tablet 81 mg (has no administration in time range)  azithromycin (ZITHROMAX) 500 mg in sodium chloride 0.9 % 250 mL IVPB (has no administration in time range)  cefTRIAXone (ROCEPHIN) 1 g in sodium chloride 0.9 % 100 mL IVPB (has no administration in time range)  ipratropium-albuterol (DUONEB) 0.5-2.5 (3) MG/3ML nebulizer solution 3 mL (3 mLs Nebulization Given 03/15/18 1654)  cefTRIAXone (ROCEPHIN) 1 g in sodium chloride 0.9 % 100 mL IVPB (0 g Intravenous Stopped 03/15/18 1851)  doxycycline (VIBRA-TABS) tablet 100 mg (100 mg Oral Given 03/15/18 1820)  furosemide (LASIX) injection 40 mg (40 mg Intravenous Given 03/15/18 1820)    Mobility walks High fall risk   Focused Assessments  Pulmonary Assessment Handoff:  Lung sounds: Bilateral Breath Sounds: Clear, Diminished O2 Device: Nasal Cannula O2 Flow Rate (L/min): 4 L/min      R Recommendations: See Admitting Provider Note  Report given to: 3E RN  Additional Notes: Get manual BP on right arm, pt won't relax arm when BP cuff is going off unless told to. Pt tolerating 3 [place] well, wears 2L [place] at home. Pt has slight pneumonia, received rocephin IV in ED as well as lasix for extremity edema and CHF. NO hx of CHF in pt's chart. Pt has had increased shob x 2 weeks and also had a couple episodes of chest pain. Pt is on pure wick after receiving lasix and tolerating well. Pt lives at home with her husband and ambulates alone. Has not ambulated in the ED.

## 2018-03-15 NOTE — ED Notes (Signed)
Lab called this RN to inform that pt's CBC clotted and needs to be recollected. Blood recollected and sent down.

## 2018-03-16 ENCOUNTER — Inpatient Hospital Stay (HOSPITAL_COMMUNITY): Payer: Medicare HMO

## 2018-03-16 DIAGNOSIS — I361 Nonrheumatic tricuspid (valve) insufficiency: Secondary | ICD-10-CM

## 2018-03-16 DIAGNOSIS — I5033 Acute on chronic diastolic (congestive) heart failure: Secondary | ICD-10-CM

## 2018-03-16 DIAGNOSIS — I340 Nonrheumatic mitral (valve) insufficiency: Secondary | ICD-10-CM

## 2018-03-16 LAB — BASIC METABOLIC PANEL
Anion gap: 10 (ref 5–15)
BUN: 12 mg/dL (ref 8–23)
CHLORIDE: 100 mmol/L (ref 98–111)
CO2: 32 mmol/L (ref 22–32)
Calcium: 10.1 mg/dL (ref 8.9–10.3)
Creatinine, Ser: 0.82 mg/dL (ref 0.44–1.00)
GFR calc Af Amer: >60 mL/min (ref >60)
GFR calc non Af Amer: >60 mL/min (ref >60)
Glucose, Bld: 256 mg/dL — ABNORMAL HIGH (ref 70–99)
Potassium: 2.8 mmol/L — ABNORMAL LOW (ref 3.5–5.1)
Sodium: 142 mmol/L (ref 135–145)

## 2018-03-16 LAB — ECHOCARDIOGRAM COMPLETE
Height: 67 in
Weight: 1834.23 oz

## 2018-03-16 LAB — MAGNESIUM: Magnesium: 1.1 mg/dL — ABNORMAL LOW (ref 1.7–2.4)

## 2018-03-16 LAB — PROCALCITONIN: Procalcitonin: <0.1 ng/mL

## 2018-03-16 LAB — INFLUENZA PANEL BY PCR (TYPE A & B)
Influenza A By PCR: NEGATIVE
Influenza B By PCR: NEGATIVE

## 2018-03-16 LAB — HIV ANTIBODY (ROUTINE TESTING W REFLEX): HIV Screen 4th Generation wRfx: NONREACTIVE

## 2018-03-16 MED ORDER — ALUM & MAG HYDROXIDE-SIMETH 200-200-20 MG/5ML PO SUSP
15.0000 mL | Freq: Once | ORAL | Status: DC
  Filled 2018-03-16: qty 30

## 2018-03-16 MED ORDER — LORAZEPAM 0.5 MG PO TABS
0.5000 mg | ORAL_TABLET | Freq: Once | ORAL | Status: AC
  Administered 2018-03-16: 0.5 mg via ORAL
  Filled 2018-03-16: qty 1

## 2018-03-16 MED ORDER — CALCIUM CARBONATE ANTACID 500 MG PO CHEW
400.0000 mg | CHEWABLE_TABLET | Freq: Three times a day (TID) | ORAL | Status: DC | PRN
  Administered 2018-03-16: 400 mg via ORAL
  Filled 2018-03-16 (×2): qty 2

## 2018-03-16 MED ORDER — POTASSIUM CHLORIDE CRYS ER 20 MEQ PO TBCR
40.0000 meq | EXTENDED_RELEASE_TABLET | ORAL | Status: AC
  Administered 2018-03-16 (×4): 40 meq via ORAL
  Filled 2018-03-16 (×4): qty 2

## 2018-03-16 NOTE — Progress Notes (Signed)
Patient very confused and talking about going into different rooms (thinks she's at home). Husband at bedside.   Patient on contact and droplet precautions- not sure why? Please advise.

## 2018-03-16 NOTE — Progress Notes (Signed)
Patient is more confused than yesterday per tech and husband, night on call paged.  Patient desats with exertion (transfer to [facility]). Had to increase O2 to 4L, now on 3L. Normally wears 2L. Will continue to monitor.

## 2018-03-16 NOTE — Plan of Care (Signed)
  Problem: Education: Goal: Ability to demonstrate management of disease process will improve Outcome: Progressing   Problem: Activity: Goal: Capacity to carry out activities will improve Outcome: Progressing   Problem: Clinical Measurements: Goal: Respiratory complications will improve Outcome: Progressing   Problem: Activity: Goal: Risk for activity intolerance will decrease Outcome: Progressing

## 2018-03-16 NOTE — Progress Notes (Signed)
  Echocardiogram 2D Echocardiogram has been performed.  [name] 03/16/2018, 2:27 PM

## 2018-03-16 NOTE — Progress Notes (Signed)
PROGRESS NOTE    Denise [name]  MCN:[id number] DOB: 06/24/1945 DOA: 03/15/2018 PCP: [name], NP   Brief Narrative:  73-year-old female with history of COPD oxygen dependent on 2 L of oxygen, hypertension, diabetes mellitus, hyperlipidemia, colon cancer, pulmonary hypertension presented to the emergency department with complaints of worsening of the shortness of breath over the last 1 month which has been significantly worsened in the last 1 week.  Patient also complained of chest pain with the tightness.  Patient had ran out of her inhalers about a week back, was only taking prednisone.  Patient also noticed to have increased swelling in the lower extremities which he caused her to have difficulty with walking.  Work-up in the emergency department patient was found to have oxygen saturations of 86% on 2 L of oxygen.  Rest of the lab work was unremarkable.  Test x-ray showed left lingular pneumonia, stable cardiomegaly.  Assessment & [name]:   Active Problems:   Acute exacerbation of CHF (congestive heart failure) ([name])   ##Pneumonia community-acquired -Keep the patient on Rocephin, Zithromax COPD exacerbation -Continue with the duo nebs, Solu-Medrol  ##Congestive heart failure diastolic acute on chronic -Echocardiogram from December 2019 showed EF of 60 to 65%, no valvular abnormalities -Continue with IV Lasix -Good improvement with the bilateral lower extremity edema  ##Acute on chronic hypoxic respiratory failure -Secondary to pneumonia, COPD exacerbation, congestive heart failure -Currently on 3 L of oxygen through nasal cannula -Patient states continues to have improvement -Continue to treat underlying conditions and follow-up  ##Hypokalemia -Replace by mouth -Follow-up with BMP  ##Debility -Continue with physical therapy  ##Hypertension  -Cont losartan, metoprolol  ##Diabetes mellitus with hyperglycemia -Increased to due to steroids keep the patient on  Lantus 10 units daily while on on steroids -Follow-up on sliding scale insulin  ##Hyperlipidemia -Not on any medication   DVT prophylaxis: Lovenox Code Status: Full code Family Communication: Husband, patient  disposition [name]: Home    Procedures:  Echocardiogram  Antimicrobials:  rocephin Zithromax  Subjective: Good improvement with shortness of breath, lower extremity edema from the time of the admission  Objective: Vitals:   03/16/18 0739 03/16/18 0742 03/16/18 1214 03/16/18 1345  BP:  (!) 146/100 126/81   Pulse:  71 67   Resp:  18 18   Temp:  97.7 F (36.5 C) (!) 97.3 F (36.3 C)   TempSrc:  Oral Oral   SpO2: 100% 96% 100% 98%  Weight:      Height:        Intake/Output Summary (Last 24 hours) at 03/16/2018 1401 Last data filed at 03/16/2018 0930 Gross per 24 hour  Intake 100 ml  Output 2000 ml  Net -1900 ml   Filed Weights   03/15/18 1821 03/16/18 0252  Weight: 56.7 kg 52 kg    Examination:  General exam: Appears calm and comfortable  Respiratory system: Good air entry.  A prolonged expiratory phase Cardiovascular system: S1 & S2 heard, RRR. No JVD, murmurs, rubs, gallops or clicks. No pedal edema. Gastrointestinal system: Abdomen is nondistended, soft and nontender. No organomegaly or masses felt. Normal bowel sounds heard. Central nervous system: Alert and oriented. No focal neurological deficits. Extremities: Symmetric 5 x 5 power. Skin: No rashes, lesions or ulcers Psychiatry: Judgement and insight appear normal. Mood & affect appropriate.     Data Reviewed: I have personally reviewed following labs and imaging studies  CBC: Recent Labs  Lab 03/15/18 1703 03/15/18 2008  WBC  --  8.1  HGB 9.5* 14.5  HCT 28.0* 47.6*  MCV  --  88.1  PLT  --  376   Basic Metabolic Panel: Recent Labs  Lab 03/15/18 1652 03/15/18 1703 03/15/18 2008 03/16/18 0412 03/16/18 0918  NA 143 141  --  142  --   K 4.0 3.9  --  2.8*  --   CL 104  --   --  100   --   CO2 28  --   --  32  --   GLUCOSE 287*  --   --  256*  --   BUN 13  --   --  12  --   CREATININE 0.94  --  0.91 0.82  --   CALCIUM 10.6*  --   --  10.1  --   MG  --   --   --   --  1.1*   GFR: Estimated Creatinine Clearance: 50.9 mL/min (by C-G formula based on SCr of 0.82 mg/dL). Liver Function Tests: Recent Labs  Lab 03/15/18 1652  AST 35  ALT 24  ALKPHOS 106  BILITOT 1.4*  PROT 5.5*  ALBUMIN 3.3*   No results for input(s): LIPASE, AMYLASE in the last 168 hours. No results for input(s): AMMONIA in the last 168 hours. Coagulation Profile: No results for input(s): INR, PROTIME in the last 168 hours. Cardiac Enzymes: Recent Labs  Lab 03/15/18 1652  TROPONINI 0.44*   BNP (last 3 results) Recent Labs    02/20/18 1236 03/01/18 1218  PROBNP 1,057.0* 1,336.0*   HbA1C: No results for input(s): HGBA1C in the last 72 hours. CBG: No results for input(s): GLUCAP in the last 168 hours. Lipid Profile: No results for input(s): CHOL, HDL, LDLCALC, TRIG, CHOLHDL, LDLDIRECT in the last 72 hours. Thyroid Function Tests: No results for input(s): TSH, T4TOTAL, FREET4, T3FREE, THYROIDAB in the last 72 hours. Anemia Panel: No results for input(s): VITAMINB12, FOLATE, FERRITIN, TIBC, IRON, RETICCTPCT in the last 72 hours. Sepsis Labs: Recent Labs  Lab 03/15/18 2008 03/16/18 0412  PROCALCITON <0.10 <0.10    No results found for this or any previous visit (from the past 240 hour(s)).       Radiology Studies: Dg Chest 2 View  Result Date: 03/15/2018 CLINICAL DATA:  Shortness of breath. Leg edema. EXAM: CHEST - 2 VIEW COMPARISON:  09/18/2017. FINDINGS: Mildly enlarged cardiac silhouette. Interval patchy density in the left lower lobe and lingula. Stable hyperexpansion of the lungs with prominent interstitial markings and peribronchial thickening. Diffuse osteopenia with stable mild upper thoracic vertebral compression deformities. IMPRESSION: 1. Left lower lobe and  lingular pneumonia. 2. Stable cardiomegaly and changes of COPD and chronic bronchitis. Electronically Signed   By: Claudie Revering M.D.   On: 03/15/2018 17:33        Scheduled Meds: . aspirin EC  81 mg Oral Daily  . [START ON 03/18/2018] cinacalcet  30 mg Oral Q M,W,F  . enoxaparin (LOVENOX) injection  40 mg Subcutaneous Q24H  . famotidine  40 mg Oral BID  . furosemide  20 mg Intravenous Q12H  . ipratropium-albuterol  3 mL Nebulization TID  . lisinopril  5 mg Oral Daily  . losartan  100 mg Oral Daily  . metoprolol tartrate  25 mg Oral BID  . mirtazapine  15 mg Oral QHS  . pantoprazole  40 mg Oral Daily  . potassium chloride  40 mEq Oral Q4H  . predniSONE  40 mg Oral Q breakfast  . sodium chloride flush  3 mL  Intravenous Q12H   Continuous Infusions: . sodium chloride    . azithromycin 500 mg (03/15/18 2215)  . cefTRIAXone (ROCEPHIN)  IV       LOS: 1 day    Time spent: 53 minutes   Demarian Epps, MD [facility] Pager [phone]  If 7PM-7AM, please contact night-coverage [email or web address] Password [facility] 03/16/2018, 2:01 PM

## 2018-03-17 LAB — BASIC METABOLIC PANEL
ANION GAP: 3 — AB (ref 5–15)
BUN: 19 mg/dL (ref 8–23)
CO2: 33 mmol/L — ABNORMAL HIGH (ref 22–32)
Calcium: 10.7 mg/dL — ABNORMAL HIGH (ref 8.9–10.3)
Chloride: 103 mmol/L (ref 98–111)
Creatinine, Ser: 1.22 mg/dL — ABNORMAL HIGH (ref 0.44–1.00)
GFR calc Af Amer: 51 mL/min — ABNORMAL LOW (ref >60)
GFR, EST NON AFRICAN AMERICAN: 44 mL/min — AB (ref >60)
Glucose, Bld: 406 mg/dL — ABNORMAL HIGH (ref 70–99)
POTASSIUM: 5 mmol/L (ref 3.5–5.1)
Sodium: 139 mmol/L (ref 135–145)

## 2018-03-17 LAB — RESPIRATORY PANEL BY PCR
Adenovirus: NOT DETECTED
Bordetella pertussis: NOT DETECTED
CORONAVIRUS NL63-RVPPCR: NOT DETECTED
Chlamydophila pneumoniae: NOT DETECTED
Coronavirus 229E: NOT DETECTED
Coronavirus HKU1: NOT DETECTED
Coronavirus OC43: NOT DETECTED
INFLUENZA A-RVPPCR: NOT DETECTED
Influenza B: NOT DETECTED
Metapneumovirus: NOT DETECTED
Mycoplasma pneumoniae: NOT DETECTED
Parainfluenza Virus 1: NOT DETECTED
Parainfluenza Virus 2: NOT DETECTED
Parainfluenza Virus 3: NOT DETECTED
Parainfluenza Virus 4: NOT DETECTED
RESPIRATORY SYNCYTIAL VIRUS-RVPPCR: NOT DETECTED
Rhinovirus / Enterovirus: NOT DETECTED

## 2018-03-17 LAB — GLUCOSE, CAPILLARY
Glucose-Capillary: 318 mg/dL — ABNORMAL HIGH (ref 70–99)
Glucose-Capillary: 270 mg/dL — ABNORMAL HIGH (ref 70–99)
Glucose-Capillary: 241 mg/dL — ABNORMAL HIGH (ref 70–99)

## 2018-03-17 LAB — HEMOGLOBIN A1C
Hgb A1c MFr Bld: 9.3 % — ABNORMAL HIGH (ref 4.8–5.6)
Mean Plasma Glucose: 220.21 mg/dL

## 2018-03-17 LAB — MAGNESIUM: Magnesium: 1.1 mg/dL — ABNORMAL LOW (ref 1.7–2.4)

## 2018-03-17 LAB — PROCALCITONIN: Procalcitonin: <0.1 ng/mL

## 2018-03-17 MED ORDER — AZITHROMYCIN 500 MG PO TABS
500.0000 mg | ORAL_TABLET | ORAL | Status: DC
  Administered 2018-03-17 – 2018-03-20 (×4): 500 mg via ORAL
  Filled 2018-03-17 (×4): qty 1

## 2018-03-17 MED ORDER — MAGNESIUM SULFATE 2 GM/50ML IV SOLN
2.0000 g | Freq: Once | INTRAVENOUS | Status: AC
  Administered 2018-03-17: 2 g via INTRAVENOUS
  Filled 2018-03-17: qty 50

## 2018-03-17 MED ORDER — INSULIN ASPART 100 UNIT/ML ~~LOC~~ SOLN
0.0000 [IU] | Freq: Three times a day (TID) | SUBCUTANEOUS | Status: DC
  Administered 2018-03-17: 2 [IU] via SUBCUTANEOUS
  Administered 2018-03-17: 7 [IU] via SUBCUTANEOUS
  Administered 2018-03-18 (×2): 3 [IU] via SUBCUTANEOUS
  Administered 2018-03-19: 5 [IU] via SUBCUTANEOUS
  Administered 2018-03-19: 3 [IU] via SUBCUTANEOUS
  Administered 2018-03-20: 2 [IU] via SUBCUTANEOUS
  Administered 2018-03-20: 3 [IU] via SUBCUTANEOUS
  Administered 2018-03-20 – 2018-03-21 (×3): 2 [IU] via SUBCUTANEOUS

## 2018-03-17 MED ORDER — INSULIN ASPART 100 UNIT/ML ~~LOC~~ SOLN
0.0000 [IU] | Freq: Every day | SUBCUTANEOUS | Status: DC
  Administered 2018-03-17: 2 [IU] via SUBCUTANEOUS
  Administered 2018-03-18: 4 [IU] via SUBCUTANEOUS
  Administered 2018-03-20: 5 [IU] via SUBCUTANEOUS

## 2018-03-17 NOTE — Progress Notes (Addendum)
Pt agitated and trying to get out of bed. RN paged MD for tele sitter. Pt refuses morning meds at this time. RN will attempt at a later time.

## 2018-03-17 NOTE — Plan of Care (Signed)
  Problem: Education: Goal: Ability to demonstrate management of disease process will improve Outcome: Progressing   Problem: Activity: Goal: Capacity to carry out activities will improve Outcome: Progressing   Problem: Cardiac: Goal: Ability to achieve and maintain adequate cardiopulmonary perfusion will improve Outcome: Progressing   Problem: Activity: Goal: Risk for activity intolerance will decrease Outcome: Progressing   Problem: Safety: Goal: Ability to remain free from injury will improve Outcome: Progressing Note:  Tele-sitter ordered at this time

## 2018-03-17 NOTE — Progress Notes (Signed)
PT Cancellation Note  Patient Details Name: Azaria A. Behne MRN: [id number] DOB: 12-01-1945   Cancelled Treatment:    Reason Eval/Treat Not Completed: Other (comment). Orders received, chart reviewed. After discussion with RN, pt with increased agitation this morning and is now resting. Will re-attempt.  Ellamae Sia, PT, DPT Acute Rehabilitation Services Pager [phone] Office [phone]    Willy Eddy 03/17/2018, 11:04 AM

## 2018-03-17 NOTE — Progress Notes (Signed)
[name] refuses tele sitter at this time. MD notified. Pt is exhausted and wants to sleep at this time. [name] states pt will take morning meds when she awakes. Zigmund Daniel, RN is aware and has morning medications.

## 2018-03-17 NOTE — Progress Notes (Signed)
PROGRESS NOTE    Denise [name] Plan  TXM:[id number] DOB: 10-Apr-1945 DOA: 03/15/2018 PCP: [name], NP   Brief Narrative: 73 year old female with history of COPD, chronic hypoxic respiratory failure on 2 L home oxygen, hypertension, diabetes, hyperlipidemia, history of colon cancer, pulmonary hypertension presenting for evaluation of shortness of breath for last 1 month which is worse in the past 1 week.  Patient was complained of chest tightness chest pain, shortness of breath worsening leg swelling.  She ran out of her inhaler for about a week and was only taking prednisone.  In the ER she was 86% on 2 L nasal cannula, chest x-ray showed lingular pneumonia, elevated troponin, and was admitted for further management  Subjective:  Patient seems to be confused this morning husband at the bedside husband reports he does have forgetfulness episode at home but has gotten worsened in past few days. On 4l  Liters nasal cannula, appears anxious but not in respiratory distress.  Assessment & Plan:  Left lingular pneumonia /community-acquired pneumonia: So far work-up with negative blood culture, influenza screen negative. Obtain respiratory virus panel.  Continue on current traction, azithromycin, bronchodilators, supplemental oxygen.   Acute on Chronic diastolic CHF exacerbation: Patient on admission significant leg edema, BNP 1914, on admission weight 125 pound-->118 lb Lasix.  We will continue the same.  Echo shows some systolic dysfunction suspecting acute on chronic combined systolic and diastolic dysfunction.  Continue losartan, metoprolol. I discontinued lisinopril.Monitor intake output.  Acute on chronic hypoxic respiratory failure in the setting of COPD exacerbation, pneumonia and CHF exacerbation.  Wean oxygen as tolerated.Continue diuresis bronchodilators and steroids as above  Acute COPD exacerbation, cont prednisone, bronchodilators supplemental  oxygen as above.  Altered  mental status/acute encephalopathy likely toxic metabolic in the setting of pneumonia respiratory failure hypoxia.  Patient seems to have some base line underlying cognitive issues forgetfulness, and now worsened.  One-to-one observation/tele obs if not bedside to prevent pulling out of the lines, to prevent fallfall and cont supportive care.  Elevated troponin in the setting of CHF, hypoxic respiratory failure, suspecting type II NSTEMI.  Troponin was 0.44 on admission.  Patient without chest pain, echocardiogram reviewed-suspecting possible infiltrative process.  Hypokalemia/hypomagnesemia: k at 5.0. monitor.  Mild AKI, creatinine has increased to from 0.8.  Off lisinopril, hold losartan.  On IV Lasix for her CHF leg swelling.  Will monitor closely.  Hypertension pressure is controlled. d/c lisinopril. Continue on losartan and metoprolol  Diabetes mellitus Type II , not on insulin side meds at home.  A1c is at  9 . Sugar is uncontrolled hyperglycemia I will add Sliding scale insulin.  DVT prophylaxis: lovenox Code Status: FULL CODE Family Communication: updated HUSBAND at bedside Disposition Plan: remains inpatient pending clinical improvement.  Consultants:  None Microbiology Influenza screen negative Blood culture 3/13 no growth to date  Procedures:  CXR 3/13 Left lower lobe and lingular pneumonia. 2. Stable cardiomegaly and changes of COPD and chronic bronchitis  TTE 3/14  The left ventricle has mildly reduced systolic function, with an ejection fraction of 45-50%. Asymmetric septal hypertrophy (moderate septal hypertrophy and otherwise mild hypertrophy of remaining myocardium). Myocardial appearance is suggestive of  infiltrative process. Diffuse hypokinesis with restrictive diastolic dysfunction and elevated filling pressures.  2. The mitral valve is abnormal. Mild thickening of the mitral valve leaflet.  3. Mildly thickened tricuspid valve leaflets.  4. The tricuspid valve is  abnormal. Tricuspid valve regurgitation is mild-moderate.  5. The aortic valve is tricuspid Mild thickening  of the aortic valve Aortic valve regurgitation is mild by color flow Doppler.  6. The right ventricle has moderately reduced systolic function. The cavity was mildly enlarged. There is mildly increased right ventricular wall thickness.  7. Left atrial size was mildly dilated.  8. Right atrial size was mildly dilated.  9. The aortic root is normal in size and structure.  Antimicrobials: Anti-infectives (From admission, onward)   Start     Dose/Rate Route Frequency Ordered Stop   03/17/18 1900  azithromycin (ZITHROMAX) tablet 500 mg     500 mg Oral Every 24 hours 03/17/18 0811 03/22/18 1859   03/16/18 1700  cefTRIAXone (ROCEPHIN) 1 g in sodium chloride 0.9 % 100 mL IVPB     1 g 200 mL/hr over 30 Minutes Intravenous Every 24 hours 03/15/18 1850 03/23/18 1659   03/15/18 1900  cefTRIAXone (ROCEPHIN) 1 g in sodium chloride 0.9 % 100 mL IVPB  Status:  Discontinued     1 g 200 mL/hr over 30 Minutes Intravenous Every 24 hours 03/15/18 1850 03/15/18 1850   03/15/18 1900  azithromycin (ZITHROMAX) 500 mg in sodium chloride 0.9 % 250 mL IVPB  Status:  Discontinued     500 mg 250 mL/hr over 60 Minutes Intravenous Every 24 hours 03/15/18 1850 03/17/18 0811   03/15/18 1815  cefTRIAXone (ROCEPHIN) 1 g in sodium chloride 0.9 % 100 mL IVPB     1 g 200 mL/hr over 30 Minutes Intravenous  Once 03/15/18 1801 03/15/18 1851   03/15/18 1815  doxycycline (VIBRA-TABS) tablet 100 mg     100 mg Oral  Once 03/15/18 1801 03/15/18 1820       Objective: Vitals:   03/17/18 0126 03/17/18 0128 03/17/18 0629 03/17/18 0734  BP: (!) 151/119 (!) 160/94 (!) 168/110   Pulse: 71 68 74 72  Resp: _0 Temp:  97.8 F (36.6 C)    TempSrc:  Oral    SpO2: 100%  98% 100%  Weight:   53.5 kg   Height:        Intake/Output Summary (Last 24 hours) at 03/17/2018 0952 Last data filed at 03/17/2018 0925 Gross per 24  hour  Intake 845.31 ml  Output 800 ml  Net 45.31 ml   Filed Weights   03/15/18 1821 03/16/18 0252 03/17/18 0629  Weight: 56.7 kg 52 kg 53.5 kg   Weight change: -3.175 kg  Body mass index is 18.48 kg/m.  Intake/Output from previous day: 03/14 0701 - 03/15 0700 In: 845.3 [P.O.:480; I.V.:15.3; IV Piggyback:350] Out: 1200 [Urine:1200] Intake/Output this shift: Total I/O In: -  Out: 300 [Urine:300]  Examination:  General exam: Appears anxious, confused, on 4 L nasal cannula, not in acute distress.  Thin HEENT:PERRL,Oral mucosa moist, Ear/Nose normal on gross exam Respiratory system: Bilateral diminished breath sounds, no wheezes or crackles  Cardiovascular system: S1 & S2 heard,No JVD, murmurs. Gastrointestinal system: Abdomen is  soft, non tender, non distended, BS +  Nervous System:Alert and oriented to self, people. No focal neurological deficits/moving extremities, sensation intact. Extremities: Bilateral pitting ankle edema, no clubbing, distal peripheral pulses palpable. Skin: No rashes, lesions, no icterus MSK: Normal muscle bulk,tone ,power  Medications:  Scheduled Meds:  alum & mag hydroxide-simeth  15 mL Oral Once   aspirin EC  81 mg Oral Daily   azithromycin  500 mg Oral Q24H   [START ON 03/18/2018] cinacalcet  30 mg Oral Q M,W,F   enoxaparin (LOVENOX) injection  40 mg Subcutaneous Q24H  famotidine  40 mg Oral BID   furosemide  20 mg Intravenous Q12H   ipratropium-albuterol  3 mL Nebulization TID   lisinopril  5 mg Oral Daily   losartan  100 mg Oral Daily   metoprolol tartrate  25 mg Oral BID   mirtazapine  15 mg Oral QHS   pantoprazole  40 mg Oral Daily   predniSONE  40 mg Oral Q breakfast   sodium chloride flush  3 mL Intravenous Q12H   Continuous Infusions:  sodium chloride 10 mL/hr at 03/16/18 2322   cefTRIAXone (ROCEPHIN)  IV Stopped (03/16/18 2151)    Data Reviewed: I have personally reviewed following labs and imaging  studies  CBC: Recent Labs  Lab 03/15/18 1703 03/15/18 2008  WBC  --  8.1  HGB 9.5* 14.5  HCT 28.0* 47.6*  MCV  --  88.1  PLT  --  837   Basic Metabolic Panel: Recent Labs  Lab 03/15/18 1652 03/15/18 1703 03/15/18 2008 03/16/18 0412 03/16/18 0918  NA 143 141  --  142  --   K 4.0 3.9  --  2.8*  --   CL 104  --   --  100  --   CO2 28  --   --  32  --   GLUCOSE 287*  --   --  256*  --   BUN 13  --   --  12  --   CREATININE 0.94  --  0.91 0.82  --   CALCIUM 10.6*  --   --  10.1  --   MG  --   --   --   --  1.1*   GFR: Estimated Creatinine Clearance: 52.4 mL/min (by C-G formula based on SCr of 0.82 mg/dL). Liver Function Tests: Recent Labs  Lab 03/15/18 1652  AST 35  ALT 24  ALKPHOS 106  BILITOT 1.4*  PROT 5.5*  ALBUMIN 3.3*   No results for input(s): LIPASE, AMYLASE in the last 168 hours. No results for input(s): AMMONIA in the last 168 hours. Coagulation Profile: No results for input(s): INR, PROTIME in the last 168 hours. Cardiac Enzymes: Recent Labs  Lab 03/15/18 1652  TROPONINI 0.44*   BNP (last 3 results) Recent Labs    02/20/18 1236 03/01/18 1218  PROBNP 1,057.0* 1,336.0*   HbA1C: No results for input(s): HGBA1C in the last 72 hours. CBG: No results for input(s): GLUCAP in the last 168 hours. Lipid Profile: No results for input(s): CHOL, HDL, LDLCALC, TRIG, CHOLHDL, LDLDIRECT in the last 72 hours. Thyroid Function Tests: No results for input(s): TSH, T4TOTAL, FREET4, T3FREE, THYROIDAB in the last 72 hours. Anemia Panel: No results for input(s): VITAMINB12, FOLATE, FERRITIN, TIBC, IRON, RETICCTPCT in the last 72 hours. Sepsis Labs: Recent Labs  Lab 03/15/18 2008 03/16/18 0412  PROCALCITON <0.10 <0.10    Recent Results (from the past 240 hour(s))  Culture, blood (routine x 2) Call MD if unable to obtain prior to antibiotics being given     Status: None (Preliminary result)   Collection Time: 03/15/18  8:09 PM  Result Value Ref Range  Status   Specimen Description BLOOD RIGHT HAND  Final   Special Requests   Final    BOTTLES DRAWN AEROBIC ONLY Blood Culture results may not be optimal due to an inadequate volume of blood received in culture bottles   Culture   Final    NO GROWTH < 24 HOURS Performed at [facility], [place] [address]  [place], [place] [address]    Report Status PENDING  Incomplete  Culture, blood (routine x 2) Call MD if unable to obtain prior to antibiotics being given     Status: None (Preliminary result)   Collection Time: 03/15/18  9:11 PM  Result Value Ref Range Status   Specimen Description BLOOD LEFT ANTECUBITAL  Final   Special Requests   Final    BOTTLES DRAWN AEROBIC ONLY Blood Culture results may not be optimal due to an inadequate volume of blood received in culture bottles   Culture   Final    NO GROWTH < 24 HOURS Performed at [facility] [address], [name], [place] [address]    Report Status PENDING  Incomplete      Radiology Studies: Dg Chest 2 View  Result Date: 03/15/2018 CLINICAL DATA:  Shortness of breath. Leg edema. EXAM: CHEST - 2 VIEW COMPARISON:  09/18/2017. FINDINGS: Mildly enlarged cardiac silhouette. Interval patchy density in the left lower lobe and lingula. Stable hyperexpansion of the lungs with prominent interstitial markings and peribronchial thickening. Diffuse osteopenia with stable mild upper thoracic vertebral compression deformities. IMPRESSION: 1. Left lower lobe and lingular pneumonia. 2. Stable cardiomegaly and changes of COPD and chronic bronchitis. Electronically Signed   By: Claudie Revering M.D.   On: 03/15/2018 17:33      LOS: 2 days   Time spent: More than 50% of that time was spent in counseling and/or coordination of care.  Antonieta Pert, MD [facility]  03/17/2018, 9:52 AM

## 2018-03-17 NOTE — Progress Notes (Signed)
Pt asleep at this time

## 2018-03-18 ENCOUNTER — Inpatient Hospital Stay (HOSPITAL_COMMUNITY): Payer: Medicare HMO

## 2018-03-18 ENCOUNTER — Encounter (HOSPITAL_COMMUNITY): Payer: Medicare HMO | Admitting: Cardiology

## 2018-03-18 DIAGNOSIS — I5033 Acute on chronic diastolic (congestive) heart failure: Secondary | ICD-10-CM

## 2018-03-18 LAB — BASIC METABOLIC PANEL
Anion gap: 7 (ref 5–15)
BUN: 18 mg/dL (ref 8–23)
CALCIUM: 10.9 mg/dL — AB (ref 8.9–10.3)
CO2: 30 mmol/L (ref 22–32)
Chloride: 105 mmol/L (ref 98–111)
Creatinine, Ser: 0.96 mg/dL (ref 0.44–1.00)
GFR calc Af Amer: >60 mL/min (ref >60)
GFR calc non Af Amer: 59 mL/min — ABNORMAL LOW (ref >60)
Glucose, Bld: 176 mg/dL — ABNORMAL HIGH (ref 70–99)
Potassium: 4 mmol/L (ref 3.5–5.1)
SODIUM: 142 mmol/L (ref 135–145)

## 2018-03-18 LAB — MAGNESIUM: Magnesium: 3.5 mg/dL — ABNORMAL HIGH (ref 1.7–2.4)

## 2018-03-18 LAB — CBC
HCT: 43.3 % (ref 36.0–46.0)
Hemoglobin: 13.4 g/dL (ref 12.0–15.0)
MCH: 26.7 pg (ref 26.0–34.0)
MCHC: 30.9 g/dL (ref 30.0–36.0)
MCV: 86.3 fL (ref 80.0–100.0)
PLATELETS: 169 10*3/uL (ref 150–400)
RBC: 5.02 MIL/uL (ref 3.87–5.11)
RDW: 14.7 % (ref 11.5–15.5)
WBC: 6.2 10*3/uL (ref 4.0–10.5)
nRBC: 0 % (ref 0.0–0.2)

## 2018-03-18 LAB — GLUCOSE, CAPILLARY
Glucose-Capillary: 162 mg/dL — ABNORMAL HIGH (ref 70–99)
Glucose-Capillary: 238 mg/dL — ABNORMAL HIGH (ref 70–99)
Glucose-Capillary: 334 mg/dL — ABNORMAL HIGH (ref 70–99)

## 2018-03-18 LAB — TROPONIN I
Troponin I: 0.29 ng/mL (ref <0.03)
Troponin I: 0.26 ng/mL (ref <0.03)

## 2018-03-18 MED ORDER — MAGNESIUM SULFATE 4 GM/100ML IV SOLN
4.0000 g | Freq: Once | INTRAVENOUS | Status: AC
  Administered 2018-03-18: 4 g via INTRAVENOUS
  Filled 2018-03-18: qty 100

## 2018-03-18 MED ORDER — MUSCLE RUB 10-15 % EX CREA
TOPICAL_CREAM | CUTANEOUS | Status: DC | PRN
  Filled 2018-03-18: qty 85

## 2018-03-18 MED ORDER — CEFDINIR 300 MG PO CAPS
300.0000 mg | ORAL_CAPSULE | Freq: Two times a day (BID) | ORAL | Status: DC
  Administered 2018-03-18 – 2018-03-21 (×6): 300 mg via ORAL
  Filled 2018-03-18 (×6): qty 1

## 2018-03-18 MED ORDER — FUROSEMIDE 40 MG PO TABS: 40.0000 mg | ORAL_TABLET | Freq: Every day | ORAL | Status: DC

## 2018-03-18 MED ORDER — LOSARTAN POTASSIUM 50 MG PO TABS
100.0000 mg | ORAL_TABLET | Freq: Every day | ORAL | Status: DC
  Administered 2018-03-18 – 2018-03-21 (×4): 100 mg via ORAL
  Filled 2018-03-18 (×4): qty 2

## 2018-03-18 MED ORDER — FUROSEMIDE 10 MG/ML IJ SOLN
40.0000 mg | Freq: Once | INTRAMUSCULAR | Status: AC
  Administered 2018-03-18: 40 mg via INTRAVENOUS
  Filled 2018-03-18: qty 4

## 2018-03-18 MED ORDER — GADOBUTROL 1 MMOL/ML IV SOLN
7.0000 mL | Freq: Once | INTRAVENOUS | Status: AC | PRN
  Administered 2018-03-18: 7 mL via INTRAVENOUS

## 2018-03-18 MED ORDER — ASPIRIN 81 MG PO CHEW
81.0000 mg | CHEWABLE_TABLET | ORAL | Status: AC
  Administered 2018-03-19: 81 mg via ORAL
  Filled 2018-03-18: qty 1

## 2018-03-18 MED ORDER — FUROSEMIDE 10 MG/ML IJ SOLN
40.0000 mg | Freq: Two times a day (BID) | INTRAMUSCULAR | Status: DC
  Administered 2018-03-18: 40 mg via INTRAVENOUS
  Filled 2018-03-18 (×2): qty 4

## 2018-03-18 MED ORDER — SODIUM CHLORIDE 0.9 % IV SOLN
INTRAVENOUS | Status: DC
  Administered 2018-03-19: 07:00:00 via INTRAVENOUS

## 2018-03-18 MED ORDER — FUROSEMIDE 10 MG/ML IJ SOLN: 40.0000 mg | Freq: Two times a day (BID) | INTRAMUSCULAR | Status: DC

## 2018-03-18 MED ORDER — HYDRALAZINE HCL 20 MG/ML IJ SOLN
10.0000 mg | Freq: Once | INTRAMUSCULAR | Status: DC
  Filled 2018-03-18: qty 1

## 2018-03-18 MED ORDER — ADULT MULTIVITAMIN W/MINERALS CH
1.0000 | ORAL_TABLET | Freq: Every day | ORAL | Status: DC
  Administered 2018-03-18 – 2018-03-21 (×4): 1 via ORAL
  Filled 2018-03-18 (×3): qty 1

## 2018-03-18 MED ORDER — ENSURE ENLIVE PO LIQD
237.0000 mL | Freq: Two times a day (BID) | ORAL | Status: DC
  Administered 2018-03-19 – 2018-03-21 (×4): 237 mL via ORAL

## 2018-03-18 MED ORDER — FLUTICASONE FUROATE-VILANTEROL 100-25 MCG/INH IN AEPB
1.0000 | INHALATION_SPRAY | Freq: Every day | RESPIRATORY_TRACT | Status: DC
  Administered 2018-03-20: 1 via RESPIRATORY_TRACT
  Filled 2018-03-18: qty 28

## 2018-03-18 MED ORDER — FUROSEMIDE 10 MG/ML IJ SOLN
20.0000 mg | Freq: Once | INTRAMUSCULAR | Status: AC
  Administered 2018-03-18: 20 mg via INTRAVENOUS

## 2018-03-18 NOTE — H&P (View-Only) (Signed)
Advanced Heart Failure Team Consult Note   Primary Physician: [name], NP PCP-Cardiologist:  No primary care provider on file.  Reason for Consultation: CHF  HPI:    Denise [name] is seen today for evaluation of CHF at the request of Dr Lupita Leash.   Denise [name] is a 73 y.o. female with severe COPD, pulmonary HTN, Diastolic CHF, HLD, DM2, Colon cancer s/p resection, hyperparathyroidism pending surgery, and Chronic respiratory failure on O2, and HTN.   Admitted 9/15 - 09/24/17 for acute on chronic hypoxic respiratory failure. RHC at that time by Dr. Aundra Dubin showed significant pulmonary HTN. Thought to be most consistent with WHO group III PH pending PFTS. Hospital course complicated by abdominal pain. GI work up with no clear cause.  Seen by Dr. Melvyn Novas 11/01/17. PFTs that day with significant COPD.   Last seen by Dr Aundra Dubin 11/2017 to establish care for [facility]. PAH thought to be group 3 from severe COPD. Not a candidate for selective pulmonary vasodilators - instead, treat underlying pulmonary issues. She was told to follow up PRN. Weight was 115 lbs.   Followed with Dr Melvyn Novas. Seen in January and started on daily prednisone.   Saw her PCP 02/2018 with BLE edema. She was started on 20 mg lasix daily. PCP discussed with pulmonary and prednisone dose was decreased.  History mostly obtained from pt's sister as pt is having some delirium. Over the last month, she has gained 12 lbs. +SOB and +BLE edema. Pt denies any CP. She takes all her medications. Lives with her husband.   She presented to ED 03/15/18 with worsening SOB over 1 month. She also has associated CP. She ran out of her inhalers 1 week ago. +cough. O2 was 86% on 2L on arrival to ER. CXR showed LLL PNA. Started on IV antibiotics, nebs, and steroids.   Pertinent admission labs include: K 4.0, creatinine 0.94, troponin 0.44, hgb 9.5, WBC 8.1, BNP 1914, PCT <0.10, flu negative, blood cx NGTD.  EKG with TWI in V2-V6 (new  in V4-V6).  CXR: 1. Left lower lobe and lingular pneumonia. 2. Stable cardiomegaly and changes of COPD and chronic bronchitis.  Repeat echo this admission showed lower EF 45-50%, diffuse HK, mild to mod TR, RV moderately reduced, LA and RA mildly dilated.   Diuresing with 20 mg IV lasix BID. Weight down 14 lbs. Creatinine stable 0.96. She is now on oral Zithromax. Afebrile. She is now on 2L O2 with sats 90-92%. Weight is back to baseline. She is in NSR with PACs with 2 short runs of ?SVT.   She feels a lot better today.   PFTs 11/01/17 FVC 1.35 (57%), FEV1 0.91 (50%), DLCO 7.42 (30%)  Echo 09/18/17 LVEF 60-65%, Grade 1 DD, Mild MR, Trivial MR, Normal RV, MIld TR, PA peak pressure 69 mm Hg.   RHC 09/20/17  RA mean 3 RV 57/2 PA 57/16, mean 32 PCWP mean 5 Oxygen saturations: PA 51% AO 93% Cardiac Output (Fick) 2.6  Cardiac Index (Fick) 1.61 PVR 10 WU (Note by Dr. Aundra Dubin: Not sure that cardiac output as obtained is correct. Oxygen saturation was up and down in the cath lab, we used a pulse ox saturation. It is possible saturation was truly lower and CO higher.)  SH: Quit smoking 1989. >30 pack years. No ETOH. Lives with husband. Drives to appointments. Manages own medications.   FH: no family hx of cardiac problems.  Review of systems complete and found to be negative unless  listed in HPI.   Home Medications Prior to Admission medications   Medication Sig Start Date End Date Taking? Authorizing Provider  albuterol (PROVENTIL HFA;VENTOLIN HFA) 108 (90 Base) MCG/ACT inhaler INHALE 1-2 PUFFS INTO THE LUNGS EVERY 4 HOURS AS NEEDED FOR WHEEZING OR SHORTNESS OF BREATH. Patient taking differently: Inhale 1-2 puffs into the lungs every 4 (four) hours as needed for wheezing or shortness of breath.  03/06/18  Yes [name], NP  cinacalcet (SENSIPAR) 30 MG tablet Take 1 tablet (30 mg total) by mouth 3 (three) times a week. Patient taking differently: Take 30 mg by mouth every Monday,  Wednesday, and Friday.  02/01/18  Yes Renato Shin, MD  famotidine (PEPCID) 40 MG tablet TAKE 1 TABLET BY MOUTH TWICE A DAY Patient taking differently: Take 40 mg by mouth daily.  01/22/18  Yes Tanda Rockers, MD  furosemide (LASIX) 20 MG tablet Take 1 tablet (20 mg total) by mouth daily. 03/12/18  Yes Tanda Rockers, MD  losartan (COZAAR) 100 MG tablet Take 1 tablet by mouth once daily for blood pressure. Patient taking differently: Take 100 mg by mouth daily. for blood pressure. 11/01/17  Yes Tanda Rockers, MD  metoprolol tartrate (LOPRESSOR) 25 MG tablet TAKE 1 TABLET BY MOUTH TWICE A DAY Patient taking differently: Take 25 mg by mouth 2 (two) times daily.  01/22/18  Yes Tanda Rockers, MD  mirtazapine (REMERON) 15 MG tablet TAKE 1 TABLET BY MOUTH AT BEDTIME. FOR DEPRESSION AND APPETITE. Patient taking differently: Take 15 mg by mouth at bedtime. For depression and appetite 02/15/18  Yes [name], NP  omeprazole (PRILOSEC) 20 MG capsule Take 20 mg by mouth daily.   Yes [provider]  OXYGEN Inhale 2 L into the lungs continuous.    Yes [provider]  potassium chloride SA (K-DUR,KLOR-CON) 20 MEQ tablet Take 1 tablet (20 mEq total) by mouth 2 (two) times daily. For low potassium. Patient taking differently: Take 20 mEq by mouth daily. For low potassium. 03/03/18  Yes [name], NP  Tiotropium Bromide-Olodaterol (STIOLTO RESPIMAT) 2.5-2.5 MCG/ACT AERS Inhale 2 puffs into the lungs daily. 03/12/18  Yes Tanda Rockers, MD    Past Medical History: Past Medical History:  Diagnosis Date  . Altered mental status   . Anxiety and depression   . Colon cancer ([place]) 1988   Resected  . COPD (chronic obstructive pulmonary disease) ([place])   . Diabetes mellitus without complication ([name])    diet controlled- no meds. per pt  . Diverticulosis   . Hyperlipidemia   . Hypertension   . Insomnia   . Primary hyperparathyroidism ([place])   . Tubular adenoma of rectum  02/23/2013   low grade  . Vitamin D deficiency     Past Surgical History: Past Surgical History:  Procedure Laterality Date  . ABDOMINAL HYSTERECTOMY  08/2015  . BREAST BIOPSY Left   . BREAST EXCISIONAL BIOPSY Right   . COLON RESECTION  1980's  . COLONOSCOPY    . ENDOMETRIAL BIOPSY  2009   negative  . INCONTINENCE SURGERY  2017  . RIGHT HEART CATH N/A 09/20/2017   Procedure: RIGHT HEART CATH;  Surgeon: Larey Dresser, MD;  Location: [facility];  Service: Cardiovascular;  Laterality: N/A;    Family History: Family History  Problem Relation Age of Onset  . Ovarian cancer Mother   . Breast cancer Neg Hx   . Hyperparathyroidism Neg Hx   . Colon cancer  Neg Hx   . Esophageal cancer Neg Hx   . Stomach cancer Neg Hx     Social History: Social History   Socioeconomic History  . Marital status: Married    Spouse name: Not on file  . Number of children: 3  . Years of education: Not on file  . Highest education level: Not on file  Occupational History  . Not on file  Social Needs  . Financial resource strain: Not on file  . Food insecurity:    Worry: Not on file    Inability: Not on file  . Transportation needs:    Medical: Not on file    Non-medical: Not on file  Tobacco Use  . Smoking status: Former Smoker    Packs/day: 1.00    Years: 30.00    Pack years: 30.00    Types: Cigarettes    Last attempt to quit: 1989    Years since quitting: 31.2  . Smokeless tobacco: Never Used  Substance and Sexual Activity  . Alcohol use: No  . Drug use: No  . Sexual activity: Yes    Partners: Male    Birth control/protection: Post-menopausal, Surgical  Lifestyle  . Physical activity:    Days per week: Not on file    Minutes per session: Not on file  . Stress: Not on file  Relationships  . Social connections:    Talks on phone: Not on file    Gets together: Not on file    Attends religious service: Not on file    Active member of club or organization: Not on file     Attends meetings of clubs or organizations: Not on file    Relationship status: Not on file  Other Topics Concern  . Not on file  Social History Narrative   Married. 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   Returned to [place] and [place] to be near children       Allergies:  No Known Allergies  Objective:    Vital Signs:   Temp:  [98 F (36.7 C)-98.4 F (36.9 C)] 98.4 F (36.9 C) (03/16 0639) Pulse Rate:  [72-88] 79 (03/16 0639) Resp:  [14-18] 18 (03/15 2104) BP: (147-165)/(95-118) 165/104 (03/16 0639) SpO2:  [88 %-100 %] 92 % (03/16 0906) Weight:  [50.4 kg] 50.4 kg (03/16 0639) Last BM Date: 03/16/18  Weight change: Filed Weights   03/16/18 0252 03/17/18 0629 03/18/18 0639  Weight: 52 kg 53.5 kg 50.4 kg    Intake/Output:   Intake/Output Summary (Last 24 hours) at 03/18/2018 1038 Last data filed at 03/18/2018 0902 Gross per 24 hour  Intake 600 ml  Output 800 ml  Net -200 ml      Physical Exam    General:  Thin.  No resp difficulty HEENT: normal Neck: supple. JVP ~7. Carotids 2+ bilat; no bruits. No lymphadenopathy or thyromegaly appreciated. Cor: PMI nondisplaced. Regular rate & rhythm with occ ectopy. No rubs, gallops or murmurs. Lungs: distant.  Abdomen: soft, nontender, nondistended. No hepatosplenomegaly. No bruits or masses. Good bowel sounds. Extremities: no cyanosis, clubbing, rash, BLE 1+ ankle edema Neuro: alert & orientedx3, cranial nerves grossly intact. moves all 4 extremities w/o difficulty. Affect pleasant   Telemetry   NSR 80s with PACs. 2 short runs of ?SVT  EKG    EKG with TWI in V2-V6 (new in V4-V6).   Labs   Basic Metabolic Panel: Recent Labs  Lab 03/15/18 1652 03/15/18  1703 03/15/18 2008 03/16/18 0412 03/16/18 0918 03/17/18 0847 03/18/18 0628  NA 143 141  --  142  --  139 142  K 4.0 3.9  --  2.8*  --  5.0 4.0  CL 104  --   --  100  --  103 105  CO2 28  --   --  32  --  33* 30  GLUCOSE  287*  --   --  256*  --  406* 176*  BUN 13  --   --  12  --  19 18  CREATININE 0.94  --  0.91 0.82  --  1.22* 0.96  CALCIUM 10.6*  --   --  10.1  --  10.7* 10.9*  MG  --   --   --   --  1.1* 1.1*  --     Liver Function Tests: Recent Labs  Lab 03/15/18 1652  AST 35  ALT 24  ALKPHOS 106  BILITOT 1.4*  PROT 5.5*  ALBUMIN 3.3*   No results for input(s): LIPASE, AMYLASE in the last 168 hours. No results for input(s): AMMONIA in the last 168 hours.  CBC: Recent Labs  Lab 03/15/18 1703 03/15/18 2008 03/18/18 0628  WBC  --  8.1 6.2  HGB 9.5* 14.5 13.4  HCT 28.0* 47.6* 43.3  MCV  --  88.1 86.3  PLT  --  181 169    Cardiac Enzymes: Recent Labs  Lab 03/15/18 1652  TROPONINI 0.44*    BNP: BNP (last 3 results) Recent Labs    09/16/17 2005 03/15/18 2008  BNP 310.4* 1,914.0*    ProBNP (last 3 results) Recent Labs    02/20/18 1236 03/01/18 1218  PROBNP 1,057.0* 1,336.0*     CBG: Recent Labs  Lab 03/17/18 1153 03/17/18 1655 03/17/18 2142 03/18/18 0636  GLUCAP 318* 270* 241* 162*    Coagulation Studies: No results for input(s): LABPROT, INR in the last 72 hours.   Imaging    No results found.   Medications:     Current Medications: . alum & mag hydroxide-simeth  15 mL Oral Once  . aspirin EC  81 mg Oral Daily  . azithromycin  500 mg Oral Q24H  . cefdinir  300 mg Oral Q12H  . cinacalcet  30 mg Oral Q M,W,F  . enoxaparin (LOVENOX) injection  40 mg Subcutaneous Q24H  . famotidine  40 mg Oral BID  . fluticasone furoate-vilanterol  1 puff Inhalation Daily  . furosemide  40 mg Intravenous BID  . insulin aspart  0-5 Units Subcutaneous QHS  . insulin aspart  0-9 Units Subcutaneous TID WC  . ipratropium-albuterol  3 mL Nebulization TID  . losartan  100 mg Oral Daily  . metoprolol tartrate  25 mg Oral BID  . mirtazapine  15 mg Oral QHS  . pantoprazole  40 mg Oral Daily  . predniSONE  40 mg Oral Q breakfast  . sodium chloride flush  3 mL  Intravenous Q12H     Infusions: . sodium chloride 10 mL/hr at 03/16/18 2322       Patient Profile   Denise [name] is a 73 y.o. female with severe COPD, pulmonary HTN, Diastolic CHF, HLD, DM2, Colon cancer s/p resection, hyperparathyroidism pending surgery, and Chronic respiratory failure on O2, and HTN.   Admitted with hypoxic respiratory failure. Found to have PNA. Repeat echo showed lower EF 45-50%.   Assessment/Plan   1. Acute on chronic hypoxic respiratory failure with severe COPD:  -  Hypoxic to 86%. PCT negative. Blood cx negative.  - CXR showed LLL and lingular PNA. Now on oral Azithromax. Afebrile.  - Seems to be back to baseline with 2L O2. - She is now confused. Likely secondary to PNA.  - Remains on prednisone and nebulizers.  - Follows with Dr Melvyn Novas as outpatient.   2. A/C diastolic HF with hx of pulmonary HTN: Significant by [place] 9/19 with PVR 10 WU. With her degree of COPD, suspect primarily WHO group 3 in setting of significant COPD.  - Echo 09/18/17 LVEF 60-65%, Grade 1 DD, Mild MR, Trivial MR, Normal RV, MIld TR, PA peak pressure 69 mm Hg.  - VQ scan 09/17/17 not suggestive of Chronic PEs.  - PFTs 11/01/17 FVC 1.35 (57%), FEV1 0.91 (50%), DLCO 7.42 (30%) - Repeat echo this admission showed lower EF 45-50%. Dr Aundra Dubin to review. Troponin was 0.44 and she has some new TWI on EKG. Will trend troponin.  - Volume looks okay on exam. She is now below baseline weight. - On 20 mg IV lasix BID. She was on lasix 20 mg daily at home. Stop IV lasix and start lasix 40 mg daily for tomorrow - Resume losartan 100 mg daily.  - Consider spiro. K was 5.0 yesterday.   - Add TED hose.  - Treatment for PAH will focus on COPD treatment. She is on 2 L PRN at home.   3. HTN - SBP 150-160s. Restart losartan 100 mg daily (home dose). - Continue lopressor 25 mg BID  4. Hyperparathyroidism.  - Sestamibi scan in 2019 did not locate a parathyroid adenoma, but suggested MNG at the  neck; surg decided she was not an operative candidate. On sensipar.  - Follows with Dr Loanne Drilling  5. Deconditioning - PT consulted.   6. Delirium - Per primary. Thought to be related to hypoxia and PNA.   Medication concerns reviewed with patient and pharmacy team. Barriers identified: unable to assess at this time.   Length of Stay: [place], NP  03/18/2018, 10:38 AM  Advanced Heart Failure Team Pager [phone] (M-F; [place])  Please contact [facility] for night-coverage after hours (4p -7a ) and weekends on [email or web address]  Patient seen with NP, agree with the above note.    She was admitted with increased dyspnea, now being treated for LLL PNA.  Also noted to have CHF exacerbation and diuresing with IV Lasix.   Echo showed EF down some to 45-50% with LVH, evidence for infiltrative disease, and moderate RV systolic dysfunction.  ECG with nonspecific anterior T wave inversions that appear new from prior.  TnI mildly elevated at 0.44.  PCT < 0.1.     On exam, JVP 12 cm.  Regular S1S2. Distant BS.  1+ edema 1/2 to knees bilaterally.   1. PNA in setting of severe COPD: Afebrile, WBCs not elevated. She is being treated with prednisone 40 mg daily and cefdinir/azithromycin. PCT not elevated.  2. Acute on chronic primarily diastolic CHF: With prominent RV failure.  Echo this admission with EF down to 45-50%, LVH, moderate RV systolic dysfunction.  She has diuresed this admission with improving dyspnea.  However, on exam, she remains volume overloaded.  There is concern based on appearance of echo for infiltrative disease.  - Lasix 40 mg IV bid for now.   - Cardiac MRI to look for evidence of cardiac amyloidosis.  3. CAD: Mild troponin elevation this admission at 0.44.  She has had atypical chest  pain and has some new T wave changes anteriorly.  Echo down to 45-50% from 60-65% prior.   - Continue ASA.  - I will arrange for left/right heart cath given these changes.  Will reassess  filling pressures and pulmonary pressure as well.  We discussed risks/benefits and she agrees to procedure.  4. Pulmonary hypertension: RHC in 2019, thought to have group 3 PH due to lung disease/COPD.   Loralie Champagne 03/18/2018 1:46 PM

## 2018-03-18 NOTE — Progress Notes (Signed)
CRITICAL VALUE ALERT  Critical Value:  troponin 0.26  Date & Time Notied:  1736  Provider Notified:   Orders Received/Actions taken:

## 2018-03-18 NOTE — Progress Notes (Signed)
Inpatient Diabetes Program Recommendations  AACE/ADA: New Consensus Statement on Inpatient Glycemic Control (2015)  Target Ranges:  Prepandial:   less than 140 mg/dL      Peak postprandial:   less than 180 mg/dL (1-2 hours)      Critically ill patients:  140 - 180 mg/dL   Results for TOM, RAGSDALE (MRN [id number]) as of 03/18/2018 10:56  Ref. Range 03/17/2018 11:53 03/17/2018 16:55 03/17/2018 21:42 03/18/2018 06:36  Glucose-Capillary Latest Ref Range: 70 - 99 mg/dL 318 (H) 270 (H) 241 (H) 162 (H)   Review of Glycemic Control  Diabetes history: DM 2 Outpatient Diabetes medications: no meds at home Current orders for Inpatient glycemic control: Novolog 0-9 units tid, Novolog 0-5 units qhs  A1c 9.3% on 3/15 was on prednisone outpatient prior to admission  Inpatient Diabetes Program Recommendations:    Pateint on PO prednisone 40 mg Daily. Fasting glucose 162 this am. Expect glucose trends to increase at meal times after prednisone administration. Patient sleepy and not eating much currently today. Consider increasing Novolog correction scale to 0-15 units tid.  Thanks,  Tama Headings RN, MSN, BC-ADM Inpatient Diabetes Coordinator Team Pager [phone] (8a-5p)

## 2018-03-18 NOTE — Consult Note (Addendum)
Advanced Heart Failure Team Consult Note   Primary Physician: [name], NP PCP-Cardiologist:  No primary care provider on file.  Reason for Consultation: CHF  HPI:    Denise [name] is seen today for evaluation of CHF at the request of Dr Lupita Leash.   Denise [name] is a 73 y.o. female with severe COPD, pulmonary HTN, Diastolic CHF, HLD, DM2, Colon cancer s/p resection, hyperparathyroidism pending surgery, and Chronic respiratory failure on O2, and HTN.   Admitted 9/15 - 09/24/17 for acute on chronic hypoxic respiratory failure. RHC at that time by Dr. Aundra Dubin showed significant pulmonary HTN. Thought to be most consistent with WHO group III PH pending PFTS. Hospital course complicated by abdominal pain. GI work up with no clear cause.  Seen by Dr. Melvyn Novas 11/01/17. PFTs that day with significant COPD.   Last seen by Dr Aundra Dubin 11/2017 to establish care for [facility]. PAH thought to be group 3 from severe COPD. Not a candidate for selective pulmonary vasodilators - instead, treat underlying pulmonary issues. She was told to follow up PRN. Weight was 115 lbs.   Followed with Dr Melvyn Novas. Seen in January and started on daily prednisone.   Saw her PCP 02/2018 with BLE edema. She was started on 20 mg lasix daily. PCP discussed with pulmonary and prednisone dose was decreased.  History mostly obtained from pt's sister as pt is having some delirium. Over the last month, she has gained 12 lbs. +SOB and +BLE edema. Pt denies any CP. She takes all her medications. Lives with her husband.   She presented to ED 03/15/18 with worsening SOB over 1 month. She also has associated CP. She ran out of her inhalers 1 week ago. +cough. O2 was 86% on 2L on arrival to ER. CXR showed LLL PNA. Started on IV antibiotics, nebs, and steroids.   Pertinent admission labs include: K 4.0, creatinine 0.94, troponin 0.44, hgb 9.5, WBC 8.1, BNP 1914, PCT <0.10, flu negative, blood cx NGTD.  EKG with TWI in V2-V6 (new  in V4-V6).  CXR: 1. Left lower lobe and lingular pneumonia. 2. Stable cardiomegaly and changes of COPD and chronic bronchitis.  Repeat echo this admission showed lower EF 45-50%, diffuse HK, mild to mod TR, RV moderately reduced, LA and RA mildly dilated.   Diuresing with 20 mg IV lasix BID. Weight down 14 lbs. Creatinine stable 0.96. She is now on oral Zithromax. Afebrile. She is now on 2L O2 with sats 90-92%. Weight is back to baseline. She is in NSR with PACs with 2 short runs of ?SVT.   She feels a lot better today.   PFTs 11/01/17 FVC 1.35 (57%), FEV1 0.91 (50%), DLCO 7.42 (30%)  Echo 09/18/17 LVEF 60-65%, Grade 1 DD, Mild MR, Trivial MR, Normal RV, MIld TR, PA peak pressure 69 mm Hg.   RHC 09/20/17  RA mean 3 RV 57/2 PA 57/16, mean 32 PCWP mean 5 Oxygen saturations: PA 51% AO 93% Cardiac Output (Fick) 2.6  Cardiac Index (Fick) 1.61 PVR 10 WU (Note by Dr. Aundra Dubin: Not sure that cardiac output as obtained is correct. Oxygen saturation was up and down in the cath lab, we used a pulse ox saturation. It is possible saturation was truly lower and CO higher.)  SH: Quit smoking 1989. >30 pack years. No ETOH. Lives with husband. Drives to appointments. Manages own medications.   FH: no family hx of cardiac problems.  Review of systems complete and found to be negative unless  listed in HPI.   Home Medications Prior to Admission medications   Medication Sig Start Date End Date Taking? Authorizing Provider  albuterol (PROVENTIL HFA;VENTOLIN HFA) 108 (90 Base) MCG/ACT inhaler INHALE 1-2 PUFFS INTO THE LUNGS EVERY 4 HOURS AS NEEDED FOR WHEEZING OR SHORTNESS OF BREATH. Patient taking differently: Inhale 1-2 puffs into the lungs every 4 (four) hours as needed for wheezing or shortness of breath.  03/06/18  Yes [name], NP  cinacalcet (SENSIPAR) 30 MG tablet Take 1 tablet (30 mg total) by mouth 3 (three) times a week. Patient taking differently: Take 30 mg by mouth every Monday,  Wednesday, and Friday.  02/01/18  Yes Renato Shin, MD  famotidine (PEPCID) 40 MG tablet TAKE 1 TABLET BY MOUTH TWICE A DAY Patient taking differently: Take 40 mg by mouth daily.  01/22/18  Yes Tanda Rockers, MD  furosemide (LASIX) 20 MG tablet Take 1 tablet (20 mg total) by mouth daily. 03/12/18  Yes Tanda Rockers, MD  losartan (COZAAR) 100 MG tablet Take 1 tablet by mouth once daily for blood pressure. Patient taking differently: Take 100 mg by mouth daily. for blood pressure. 11/01/17  Yes Tanda Rockers, MD  metoprolol tartrate (LOPRESSOR) 25 MG tablet TAKE 1 TABLET BY MOUTH TWICE A DAY Patient taking differently: Take 25 mg by mouth 2 (two) times daily.  01/22/18  Yes Tanda Rockers, MD  mirtazapine (REMERON) 15 MG tablet TAKE 1 TABLET BY MOUTH AT BEDTIME. FOR DEPRESSION AND APPETITE. Patient taking differently: Take 15 mg by mouth at bedtime. For depression and appetite 02/15/18  Yes [name], NP  omeprazole (PRILOSEC) 20 MG capsule Take 20 mg by mouth daily.   Yes [provider]  OXYGEN Inhale 2 L into the lungs continuous.    Yes [provider]  potassium chloride SA (K-DUR,KLOR-CON) 20 MEQ tablet Take 1 tablet (20 mEq total) by mouth 2 (two) times daily. For low potassium. Patient taking differently: Take 20 mEq by mouth daily. For low potassium. 03/03/18  Yes [name], NP  Tiotropium Bromide-Olodaterol (STIOLTO RESPIMAT) 2.5-2.5 MCG/ACT AERS Inhale 2 puffs into the lungs daily. 03/12/18  Yes Tanda Rockers, MD    Past Medical History: Past Medical History:  Diagnosis Date  . Altered mental status   . Anxiety and depression   . Colon cancer ([place]) 1988   Resected  . COPD (chronic obstructive pulmonary disease) ([place])   . Diabetes mellitus without complication ([name])    diet controlled- no meds. per pt  . Diverticulosis   . Hyperlipidemia   . Hypertension   . Insomnia   . Primary hyperparathyroidism ([place])   . Tubular adenoma of rectum  02/23/2013   low grade  . Vitamin D deficiency     Past Surgical History: Past Surgical History:  Procedure Laterality Date  . ABDOMINAL HYSTERECTOMY  08/2015  . BREAST BIOPSY Left   . BREAST EXCISIONAL BIOPSY Right   . COLON RESECTION  1980's  . COLONOSCOPY    . ENDOMETRIAL BIOPSY  2009   negative  . INCONTINENCE SURGERY  2017  . RIGHT HEART CATH N/A 09/20/2017   Procedure: RIGHT HEART CATH;  Surgeon: Larey Dresser, MD;  Location: [place] [facility];  Service: Cardiovascular;  Laterality: N/A;    Family History: Family History  Problem Relation Age of Onset  . Ovarian cancer Mother   . Breast cancer Neg Hx   . Hyperparathyroidism Neg Hx   . Colon cancer  Neg Hx   . Esophageal cancer Neg Hx   . Stomach cancer Neg Hx     Social History: Social History   Socioeconomic History  . Marital status: Married    Spouse name: Not on file  . Number of children: 3  . Years of education: Not on file  . Highest education level: Not on file  Occupational History  . Not on file  Social Needs  . Financial resource strain: Not on file  . Food insecurity:    Worry: Not on file    Inability: Not on file  . Transportation needs:    Medical: Not on file    Non-medical: Not on file  Tobacco Use  . Smoking status: Former Smoker    Packs/day: 1.00    Years: 30.00    Pack years: 30.00    Types: Cigarettes    Last attempt to quit: 1989    Years since quitting: 31.2  . Smokeless tobacco: Never Used  Substance and Sexual Activity  . Alcohol use: No  . Drug use: No  . Sexual activity: Yes    Partners: Male    Birth control/protection: Post-menopausal, Surgical  Lifestyle  . Physical activity:    Days per week: Not on file    Minutes per session: Not on file  . Stress: Not on file  Relationships  . Social connections:    Talks on phone: Not on file    Gets together: Not on file    Attends religious service: Not on file    Active member of club or organization: Not on file     Attends meetings of clubs or organizations: Not on file    Relationship status: Not on file  Other Topics Concern  . Not on file  Social History Narrative   Married. 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   Returned to [place] and [place] to be near children       Allergies:  No Known Allergies  Objective:    Vital Signs:   Temp:  [98 F (36.7 C)-98.4 F (36.9 C)] 98.4 F (36.9 C) (03/16 0639) Pulse Rate:  [72-88] 79 (03/16 0639) Resp:  [14-18] 18 (03/15 2104) BP: (147-165)/(95-118) 165/104 (03/16 0639) SpO2:  [88 %-100 %] 92 % (03/16 0906) Weight:  [50.4 kg] 50.4 kg (03/16 0639) Last BM Date: 03/16/18  Weight change: Filed Weights   03/16/18 0252 03/17/18 0629 03/18/18 0639  Weight: 52 kg 53.5 kg 50.4 kg    Intake/Output:   Intake/Output Summary (Last 24 hours) at 03/18/2018 1038 Last data filed at 03/18/2018 0902 Gross per 24 hour  Intake 600 ml  Output 800 ml  Net -200 ml      Physical Exam    General:  Thin.  No resp difficulty HEENT: normal Neck: supple. JVP ~7. Carotids 2+ bilat; no bruits. No lymphadenopathy or thyromegaly appreciated. Cor: PMI nondisplaced. Regular rate & rhythm with occ ectopy. No rubs, gallops or murmurs. Lungs: distant.  Abdomen: soft, nontender, nondistended. No hepatosplenomegaly. No bruits or masses. Good bowel sounds. Extremities: no cyanosis, clubbing, rash, BLE 1+ ankle edema Neuro: alert & orientedx3, cranial nerves grossly intact. moves all 4 extremities w/o difficulty. Affect pleasant   Telemetry   NSR 80s with PACs. 2 short runs of ?SVT  EKG    EKG with TWI in V2-V6 (new in V4-V6).   Labs   Basic Metabolic Panel: Recent Labs  Lab 03/15/18 1652 03/15/18  1703 03/15/18 2008 03/16/18 0412 03/16/18 0918 03/17/18 0847 03/18/18 0628  NA 143 141  --  142  --  139 142  K 4.0 3.9  --  2.8*  --  5.0 4.0  CL 104  --   --  100  --  103 105  CO2 28  --   --  32  --  33* 30  GLUCOSE  287*  --   --  256*  --  406* 176*  BUN 13  --   --  12  --  19 18  CREATININE 0.94  --  0.91 0.82  --  1.22* 0.96  CALCIUM 10.6*  --   --  10.1  --  10.7* 10.9*  MG  --   --   --   --  1.1* 1.1*  --     Liver Function Tests: Recent Labs  Lab 03/15/18 1652  AST 35  ALT 24  ALKPHOS 106  BILITOT 1.4*  PROT 5.5*  ALBUMIN 3.3*   No results for input(s): LIPASE, AMYLASE in the last 168 hours. No results for input(s): AMMONIA in the last 168 hours.  CBC: Recent Labs  Lab 03/15/18 1703 03/15/18 2008 03/18/18 0628  WBC  --  8.1 6.2  HGB 9.5* 14.5 13.4  HCT 28.0* 47.6* 43.3  MCV  --  88.1 86.3  PLT  --  181 169    Cardiac Enzymes: Recent Labs  Lab 03/15/18 1652  TROPONINI 0.44*    BNP: BNP (last 3 results) Recent Labs    09/16/17 2005 03/15/18 2008  BNP 310.4* 1,914.0*    ProBNP (last 3 results) Recent Labs    02/20/18 1236 03/01/18 1218  PROBNP 1,057.0* 1,336.0*     CBG: Recent Labs  Lab 03/17/18 1153 03/17/18 1655 03/17/18 2142 03/18/18 0636  GLUCAP 318* 270* 241* 162*    Coagulation Studies: No results for input(s): LABPROT, INR in the last 72 hours.   Imaging    No results found.   Medications:     Current Medications: . alum & mag hydroxide-simeth  15 mL Oral Once  . aspirin EC  81 mg Oral Daily  . azithromycin  500 mg Oral Q24H  . cefdinir  300 mg Oral Q12H  . cinacalcet  30 mg Oral Q M,W,F  . enoxaparin (LOVENOX) injection  40 mg Subcutaneous Q24H  . famotidine  40 mg Oral BID  . fluticasone furoate-vilanterol  1 puff Inhalation Daily  . furosemide  40 mg Intravenous BID  . insulin aspart  0-5 Units Subcutaneous QHS  . insulin aspart  0-9 Units Subcutaneous TID WC  . ipratropium-albuterol  3 mL Nebulization TID  . losartan  100 mg Oral Daily  . metoprolol tartrate  25 mg Oral BID  . mirtazapine  15 mg Oral QHS  . pantoprazole  40 mg Oral Daily  . predniSONE  40 mg Oral Q breakfast  . sodium chloride flush  3 mL  Intravenous Q12H     Infusions: . sodium chloride 10 mL/hr at 03/16/18 2322       Patient Profile   Denise [name] is a 73 y.o. female with severe COPD, pulmonary HTN, Diastolic CHF, HLD, DM2, Colon cancer s/p resection, hyperparathyroidism pending surgery, and Chronic respiratory failure on O2, and HTN.   Admitted with hypoxic respiratory failure. Found to have PNA. Repeat echo showed lower EF 45-50%.   Assessment/Plan   1. Acute on chronic hypoxic respiratory failure with severe COPD:  -  Hypoxic to 86%. PCT negative. Blood cx negative.  - CXR showed LLL and lingular PNA. Now on oral Azithromax. Afebrile.  - Seems to be back to baseline with 2L O2. - She is now confused. Likely secondary to PNA.  - Remains on prednisone and nebulizers.  - Follows with Dr Melvyn Novas as outpatient.   2. A/C diastolic HF with hx of pulmonary HTN: Significant by [place] 9/19 with PVR 10 WU. With her degree of COPD, suspect primarily WHO group 3 in setting of significant COPD.  - Echo 09/18/17 LVEF 60-65%, Grade 1 DD, Mild MR, Trivial MR, Normal RV, MIld TR, PA peak pressure 69 mm Hg.  - VQ scan 09/17/17 not suggestive of Chronic PEs.  - PFTs 11/01/17 FVC 1.35 (57%), FEV1 0.91 (50%), DLCO 7.42 (30%) - Repeat echo this admission showed lower EF 45-50%. Dr Aundra Dubin to review. Troponin was 0.44 and she has some new TWI on EKG. Will trend troponin.  - Volume looks okay on exam. She is now below baseline weight. - On 20 mg IV lasix BID. She was on lasix 20 mg daily at home. Stop IV lasix and start lasix 40 mg daily for tomorrow - Resume losartan 100 mg daily.  - Consider spiro. K was 5.0 yesterday.   - Add TED hose.  - Treatment for PAH will focus on COPD treatment. She is on 2 L PRN at home.   3. HTN - SBP 150-160s. Restart losartan 100 mg daily (home dose). - Continue lopressor 25 mg BID  4. Hyperparathyroidism.  - Sestamibi scan in 2019 did not locate a parathyroid adenoma, but suggested MNG at the  neck; surg decided she was not an operative candidate. On sensipar.  - Follows with Dr Loanne Drilling  5. Deconditioning - PT consulted.   6. Delirium - Per primary. Thought to be related to hypoxia and PNA.   Medication concerns reviewed with patient and pharmacy team. Barriers identified: unable to assess at this time.   Length of Stay: [place], NP  03/18/2018, 10:38 AM  Advanced Heart Failure Team Pager [phone] (M-F; [place])  Please contact [facility] for night-coverage after hours (4p -7a ) and weekends on [email or web address]  Patient seen with NP, agree with the above note.    She was admitted with increased dyspnea, now being treated for LLL PNA.  Also noted to have CHF exacerbation and diuresing with IV Lasix.   Echo showed EF down some to 45-50% with LVH, evidence for infiltrative disease, and moderate RV systolic dysfunction.  ECG with nonspecific anterior T wave inversions that appear new from prior.  TnI mildly elevated at 0.44.  PCT < 0.1.     On exam, JVP 12 cm.  Regular S1S2. Distant BS.  1+ edema 1/2 to knees bilaterally.   1. PNA in setting of severe COPD: Afebrile, WBCs not elevated. She is being treated with prednisone 40 mg daily and cefdinir/azithromycin. PCT not elevated.  2. Acute on chronic primarily diastolic CHF: With prominent RV failure.  Echo this admission with EF down to 45-50%, LVH, moderate RV systolic dysfunction.  She has diuresed this admission with improving dyspnea.  However, on exam, she remains volume overloaded.  There is concern based on appearance of echo for infiltrative disease.  - Lasix 40 mg IV bid for now.   - Cardiac MRI to look for evidence of cardiac amyloidosis.  3. CAD: Mild troponin elevation this admission at 0.44.  She has had atypical chest  pain and has some new T wave changes anteriorly.  Echo down to 45-50% from 60-65% prior.   - Continue ASA.  - I will arrange for left/right heart cath given these changes.  Will reassess  filling pressures and pulmonary pressure as well.  We discussed risks/benefits and she agrees to procedure.  4. Pulmonary hypertension: RHC in 2019, thought to have group 3 PH due to lung disease/COPD.   Loralie Champagne 03/18/2018 1:46 PM

## 2018-03-18 NOTE — Progress Notes (Signed)
Initial Nutrition Assessment  DOCUMENTATION CODES:   Severe malnutrition in context of chronic illness, Underweight  INTERVENTION:   -MVI with minerals daily -Ensure Enlive po BID, each supplement provides 350 kcal and 20 grams of protein -Magic Cup TID with meals, each supplement provides 290 kcals and 9 grams of protein  NUTRITION DIAGNOSIS:   Severe Malnutrition related to chronic illness(CHF, COPD) as evidenced by energy intake < 75% for > or equal to 1 month, moderate fat depletion, severe fat depletion, moderate muscle depletion, severe muscle depletion, edema.  GOAL:   Patient will meet greater than or equal to 90% of their needs  MONITOR:   PO intake, Supplement acceptance, Labs, Weight trends, Skin, I & O's  REASON FOR ASSESSMENT:   Other (Comment)    ASSESSMENT:   73 year old Denise [name] with PMH of CHF, COPD, colon resection, HTN, hyperlipidemia, and DM II presents to the ED with Worsening SOB, bilateral edema, PNA (likely secondary to CHF and COPD.   Denise [name] admitted with acute on chronic respiratory failure and pneumonia.   Reviewed I/O's: -980 ml x 24 hours and -2.5 L since admission  Spoke with Denise [name], husband, and sisters at bedside. Denise [name] was pleasantly confused at time of visit, so the majority of history was provided by husband. He reports that Denise [name]'s intake is very inconsistent and unpredictable. There are times when Denise [name] eats well (Breakfast: liver pudding, grits, and pancakes; Lunch: sandwich or leftovers from night before; Dinner: [employer] roast beef OR hot dogs OR BBQ sandwich). Her husband reports that when Denise [name] was working, she would pack herself a lunch but not eat it. When no one is present to prompt her to eat or drink, Denise [name] will have no oral intake after her breakfast. Denise [name] husband reports there are often days that he will encourage her to eat, but will only eat a few bites. Denise [name] will also consume one Ensure supplement per day with encouragement.   Since being in the hospital, Denise [name]  husband has been staying with her in order to assist with feeding her with meals. He reports she ate a "good" breakfast- most of her eggs, pancakes, and sausage. She refused grits as she did not like the way the hospital prepared them.    Denise [name] husband confirms wt loss, however, changes difficult to quantify related to fluid status. He reports her wt was down to 111# after last hospitalization for heart failure. Additionally, he reports Denise [name] has lost muscle tone and gone down in clothing size (recently went to a tailor to get all of her clothes altered).   Discussed with Denise [name] and husband importance of good meal and supplement intake to promote healing. Encouraged husband to take advantage of when Denise [name] was eating well, especially at breakfast time. Also reviewed supplements to assist with optimizing oral intake. Both are agreeable to [employer] and Ensure. Denise [name] with poor oral intake and would benefit from nutrient dense supplement. One Ensure Enlive supplement provides 350 kcals, 20 grams protein, and 44-45 grams of carbohydrate vs one Glucerna shake supplement, which provides 220 kcals, 10 grams of protein, and 26 grams of carbohydrate. Given Denise [name]'s hx of DM, RD will continue to monitor PO intake, CBGS, and adjust supplement regimen as appropriate.   Lab Results  Component Value Date   HGBA1C 9.3 (H) 03/17/2018   PTA DM medications are none.   Medications reviewed and include remeron and prednisone. Suspect steroids (during hospitalization and PTA) are contributing to elevation of CBGS and Hgb A1c.  Labs reviewed: CBGS: 761-470 (inpatient orders for glycemic control are 0-5 units insulin aspart q HS and 0-9 units insulin aspart TID with meals).   NUTRITION - FOCUSED PHYSICAL EXAM:    Most Recent Value  Orbital Region  Moderate depletion  Upper Arm Region  Severe depletion  Thoracic and Lumbar Region  Severe depletion  Buccal Region  Moderate depletion  Temple Region  Severe depletion  Clavicle Bone Region   Severe depletion  Clavicle and Acromion Bone Region  Severe depletion  Scapular Bone Region  Severe depletion  Dorsal Hand  Severe depletion  Patellar Region  Severe depletion  Anterior Thigh Region  Severe depletion  Posterior Calf Region  Mild depletion  Edema (RD Assessment)  Moderate  Hair  Reviewed  Eyes  Reviewed  Mouth  Reviewed  Skin  Reviewed  Nails  Reviewed       Diet Order:   Diet Order            Diet NPO time specified  Diet effective midnight        Diet Carb Modified Fluid consistency: Thin; Room service appropriate? Yes; Fluid restriction: 1500 mL Fluid  Diet effective now              EDUCATION NEEDS:   Education needs have been addressed  Skin:  Skin Assessment: Reviewed RN Assessment  Last BM:  03/16/18  Height:   Ht Readings from Last 1 Encounters:  03/15/18 _0  (1.702 m)    Weight:   Wt Readings from Last 1 Encounters:  03/18/18 50.4 kg    Ideal Body Weight:  61.4 kg  BMI:  Body mass index is 17.4 kg/m.  Estimated Nutritional Needs:   Kcal:  1650-1850  Protein:  85-100 grams  Fluid:  1.5 L    Mechille Varghese A. Jimmye Norman, RD, LDN, [place] Registered Dietitian II Certified Diabetes Care and Education Specialist Pager: [phone] After hours Pager: [phone]

## 2018-03-18 NOTE — Progress Notes (Signed)
PROGRESS NOTE    Denise [name]  YTK:[id number] DOB: [birth year] DOA: 03/15/2018 PCP: [name], NP   Brief Narrative: 73 year old female with history of COPD, chronic hypoxic respiratory failure on 2 L home oxygen, hypertension, diabetes, hyperlipidemia, history of colon cancer, pulmonary hypertension presenting for evaluation of shortness of breath for last 1 month which is worse in the past 1 week.  Patient was complained of chest tightness chest pain, shortness of breath worsening leg swelling.  She ran out of her inhaler for about a week and was only taking prednisone.  In the ER she was 86% on 2 L nasal cannula, chest x-ray showed lingular pneumonia, elevated troponin, and was admitted for further management  Subjective: Patient seen and examined.No acute events overnight.  Sitting comfortably with her husband, at the bedside. Denies nausea vomiting chest pain or fever overnight.  Mental status much improved and at baseline per husband.  Assessment & [name]:  Left lingular pneumonia /community-acquired pneumonia: So far work-up with negative blood culture, influenza screen and respiratory virus panel negative. Switch to po omnicef/azithro. Cont bronchodilators, supplemental oxygen.   Acute on Chronic diastolic CHF exacerbation: Patient on admission significant leg edema, BNP 1914, on admission weight 125 pound-->111 lb Lasix. Total 2.2 l negative.  Patient reports her weight has been in the range of 130 pound in past several months. She still has persistent leg edema, so will  increase Lasix to 40 mg IV twice daily.  Creatinine stable. Echo shows some systolic dysfunction suspecting acute on chronic combined systolic and diastolic dysfunction with LVEF 45 to 50%, asymmetric septal hypertrophy myocardial appearance suggestive of infiltrative process, diffuse hypokinesia with restrictive diastolic dysfunction. Resume losartan, cont metoprolol. I have discontinued lisinopril 3/15.  I'll request cardiology consultation  Acute on chronic hypoxic respiratory failure in the setting of COPD exacerbation, pneumonia and CHF exacerbation.  Currently on 2 L nasal cannula, home setting.  Continue diuresis bronchodilators and steroids as above  Acute COPD exacerbation w PULM HTN, cont prednisone taper, bronchodilators with DuoNeb supplemental  oxygen as above. Add breo daily.  Altered mental status/acute encephalopathy likely toxic metabolic in the setting of pneumonia respiratory failure hypoxia.  Patient seems to have some baseline underlying cognitive issues forgetfulness, and now worsened in the hospital.  But today she is much more alert awake and oriented with baseline mental status, husband is at the bedside 24/7 providing care.  Refused one-to-one. Cont on fall and cont supportive care. PT eval.  Elevated troponin 0.44 in the setting of CHF, hypoxic respiratory failure. echocardiogram reviewed-suspecting possible infiltrative process.  Will request cardiology consultation.  Hypokalemia/hypomagnesemia:was repleted.  Labs stable.   Recheck mag  Mild AKI, creatinine has increased to from 0.8.  Off lisinopril, hold losartan.  On IV Lasix for her CHF leg swelling.  Will monitor closely.  Hypertension pressure is controlled. d/c lisinopril. Continue on losartan and metoprolol  Diabetes mellitus Type II , not on insulin or  meds at home.  A1c is at  9 . Sugar is uncontrolled.  No blood sugar improving after being on insulin. .  DVT prophylaxis: lovenox Code Status: FULL CODE Family Communication: updated HUSBAND at bedside Disposition [name]: remains inpatient pending clinical improvement, continue on diuresis.  Consultants:  Cardiology  Microbiology Influenza screen negative Blood culture 3/13 no growth to date  Procedures:  CXR 3/13 Left lower lobe and lingular pneumonia. 2. Stable cardiomegaly and changes of COPD and chronic bronchitis  TTE 3/14  The left ventricle  has  mildly reduced systolic function, with an ejection fraction of 45-50%. Asymmetric septal hypertrophy (moderate septal hypertrophy and otherwise mild hypertrophy of remaining myocardium). Myocardial appearance is suggestive of  infiltrative process. Diffuse hypokinesis with restrictive diastolic dysfunction and elevated filling pressures.  2. The mitral valve is abnormal. Mild thickening of the mitral valve leaflet.  3. Mildly thickened tricuspid valve leaflets.  4. The tricuspid valve is abnormal. Tricuspid valve regurgitation is mild-moderate.  5. The aortic valve is tricuspid Mild thickening of the aortic valve Aortic valve regurgitation is mild by color flow Doppler.  6. The right ventricle has moderately reduced systolic function. The cavity was mildly enlarged. There is mildly increased right ventricular wall thickness.  7. Left atrial size was mildly dilated.  8. Right atrial size was mildly dilated.  9. The aortic root is normal in size and structure.  Antimicrobials: Anti-infectives (From admission, onward)   Start     Dose/Rate Route Frequency Ordered Stop   03/18/18 1800  cefdinir (OMNICEF) capsule 300 mg     300 mg Oral Every 12 hours 03/18/18 0913 03/23/18 2159   03/17/18 1900  azithromycin (ZITHROMAX) tablet 500 mg     500 mg Oral Every 24 hours 03/17/18 0811 03/22/18 1859   03/16/18 1700  cefTRIAXone (ROCEPHIN) 1 g in sodium chloride 0.9 % 100 mL IVPB  Status:  Discontinued     1 g 200 mL/hr over 30 Minutes Intravenous Every 24 hours 03/15/18 1850 03/18/18 0913   03/15/18 1900  cefTRIAXone (ROCEPHIN) 1 g in sodium chloride 0.9 % 100 mL IVPB  Status:  Discontinued     1 g 200 mL/hr over 30 Minutes Intravenous Every 24 hours 03/15/18 1850 03/15/18 1850   03/15/18 1900  azithromycin (ZITHROMAX) 500 mg in sodium chloride 0.9 % 250 mL IVPB  Status:  Discontinued     500 mg 250 mL/hr over 60 Minutes Intravenous Every 24 hours 03/15/18 1850 03/17/18 0811   03/15/18 1815   cefTRIAXone (ROCEPHIN) 1 g in sodium chloride 0.9 % 100 mL IVPB     1 g 200 mL/hr over 30 Minutes Intravenous  Once 03/15/18 1801 03/15/18 1851   03/15/18 1815  doxycycline (VIBRA-TABS) tablet 100 mg     100 mg Oral  Once 03/15/18 1801 03/15/18 1820       Objective: Vitals:   03/17/18 2104 03/17/18 2300 03/18/18 0639 03/18/18 0906  BP: (!) 158/118  (!) 165/104   Pulse: 88  79   Resp: 18     Temp: 98 F (36.7 C)  98.4 F (36.9 C)   TempSrc: Oral  Oral   SpO2: (!) 88% 98% 90% 92%  Weight:   50.4 kg   Height:        Intake/Output Summary (Last 24 hours) at 03/18/2018 0913 Last data filed at 03/18/2018 0902 Gross per 24 hour  Intake 600 ml  Output 900 ml  Net -300 ml   Filed Weights   03/16/18 0252 03/17/18 0629 03/18/18 0639  Weight: 52 kg 53.5 kg 50.4 kg   Weight change: -3.13 kg  Body mass index is 17.4 kg/m.  Intake/Output from previous day: 03/15 0701 - 03/16 0700 In: 120 [P.O.:120] Out: 1100 [Urine:1100] Intake/Output this shift: Total I/O In: 480 [P.O.:480] Out: -   Examination: General exam: Calm, comfortable, not in acute distress, frail, older for age, thin built.  HEENT:Oral mucosa moist, Ear/Nose WNL grossly, dentition normal. Respiratory system: Bilateral diminished breath sound, no use of accessory muscle, non tender on palpation.  Cardiovascular system: regular rate and rhythm, S1 & S2 heard, No JVD/murmurs. Gastrointestinal system: Abdomen soft, non-tender, non-distended, BS +. No hepatosplenomegaly palpable. Nervous System:Alert, awake and oriented at baseline. Able to move UE and LE, sensation intact. Extremities: Bilateral 2+ pitting edema, distal peripheral pulses palpable.  Skin: No rashes,no icterus. MSK: Normal muscle bulk,tone, power  Medications:  Scheduled Meds:  alum & mag hydroxide-simeth  15 mL Oral Once   aspirin EC  81 mg Oral Daily   azithromycin  500 mg Oral Q24H   cefdinir  300 mg Oral Q12H   cinacalcet  30 mg Oral Q  M,W,F   enoxaparin (LOVENOX) injection  40 mg Subcutaneous Q24H   famotidine  40 mg Oral BID   fluticasone furoate-vilanterol  1 puff Inhalation Daily   furosemide  40 mg Intravenous BID   insulin aspart  0-5 Units Subcutaneous QHS   insulin aspart  0-9 Units Subcutaneous TID WC   ipratropium-albuterol  3 mL Nebulization TID   losartan  100 mg Oral Daily   metoprolol tartrate  25 mg Oral BID   mirtazapine  15 mg Oral QHS   pantoprazole  40 mg Oral Daily   predniSONE  40 mg Oral Q breakfast   sodium chloride flush  3 mL Intravenous Q12H   Continuous Infusions:  sodium chloride 10 mL/hr at 03/16/18 2322    Data Reviewed: I have personally reviewed following labs and imaging studies  CBC: Recent Labs  Lab 03/15/18 1703 03/15/18 2008 03/18/18 0628  WBC  --  8.1 6.2  HGB 9.5* 14.5 13.4  HCT 28.0* 47.6* 43.3  MCV  --  88.1 86.3  PLT  --  181 875   Basic Metabolic Panel: Recent Labs  Lab 03/15/18 1652 03/15/18 1703 03/15/18 2008 03/16/18 0412 03/16/18 0918 03/17/18 0847 03/18/18 0628  NA 143 141  --  142  --  139 142  K 4.0 3.9  --  2.8*  --  5.0 4.0  CL 104  --   --  100  --  103 105  CO2 28  --   --  32  --  33* 30  GLUCOSE 287*  --   --  256*  --  406* 176*  BUN 13  --   --  12  --  19 18  CREATININE 0.94  --  0.91 0.82  --  1.22* 0.96  CALCIUM 10.6*  --   --  10.1  --  10.7* 10.9*  MG  --   --   --   --  1.1* 1.1*  --    GFR: Estimated Creatinine Clearance: 42.1 mL/min (by C-G formula based on SCr of 0.96 mg/dL). Liver Function Tests: Recent Labs  Lab 03/15/18 1652  AST 35  ALT 24  ALKPHOS 106  BILITOT 1.4*  PROT 5.5*  ALBUMIN 3.3*   No results for input(s): LIPASE, AMYLASE in the last 168 hours. No results for input(s): AMMONIA in the last 168 hours. Coagulation Profile: No results for input(s): INR, PROTIME in the last 168 hours. Cardiac Enzymes: Recent Labs  Lab 03/15/18 1652  TROPONINI 0.44*   BNP (last 3 results) Recent Labs     02/20/18 1236 03/01/18 1218  PROBNP 1,057.0* 1,336.0*   HbA1C: Recent Labs    03/17/18 1005  HGBA1C 9.3*   CBG: Recent Labs  Lab 03/17/18 1153 03/17/18 1655 03/17/18 2142 03/18/18 0636  GLUCAP 318* 270* 241* 162*   Lipid Profile: No results for input(s): CHOL,  HDL, LDLCALC, TRIG, CHOLHDL, LDLDIRECT in the last 72 hours. Thyroid Function Tests: No results for input(s): TSH, T4TOTAL, FREET4, T3FREE, THYROIDAB in the last 72 hours. Anemia Panel: No results for input(s): VITAMINB12, FOLATE, FERRITIN, TIBC, IRON, RETICCTPCT in the last 72 hours. Sepsis Labs: Recent Labs  Lab 03/15/18 2008 03/16/18 0412 03/17/18 0847  PROCALCITON <0.10 <0.10 <0.10    Recent Results (from the past 240 hour(s))  Culture, blood (routine x 2) Call MD if unable to obtain prior to antibiotics being given     Status: None (Preliminary result)   Collection Time: 03/15/18  8:09 PM  Result Value Ref Range Status   Specimen Description BLOOD RIGHT HAND  Final   Special Requests   Final    BOTTLES DRAWN AEROBIC ONLY Blood Culture results may not be optimal due to an inadequate volume of blood received in culture bottles   Culture   Final    NO GROWTH 2 DAYS Performed at [facility], [place] [address] [place], [place] [address]    Report Status PENDING  Incomplete  Culture, blood (routine x 2) Call MD if unable to obtain prior to antibiotics being given     Status: None (Preliminary result)   Collection Time: 03/15/18  9:11 PM  Result Value Ref Range Status   Specimen Description BLOOD LEFT ANTECUBITAL  Final   Special Requests   Final    BOTTLES DRAWN AEROBIC ONLY Blood Culture results may not be optimal due to an inadequate volume of blood received in culture bottles   Culture   Final    NO GROWTH 2 DAYS Performed at [facility], [place] [address], [place], [place] [address]    Report Status PENDING  Incomplete  Respiratory Panel by PCR     Status: None   Collection Time:  03/17/18  8:41 AM  Result Value Ref Range Status   Adenovirus NOT DETECTED NOT DETECTED Final   Coronavirus 229E NOT DETECTED NOT DETECTED Final    Comment: (NOTE) The Coronavirus on the Respiratory Panel, DOES NOT test for the novel  Coronavirus (2019 nCoV)    Coronavirus HKU1 NOT DETECTED NOT DETECTED Final   Coronavirus NL63 NOT DETECTED NOT DETECTED Final   Coronavirus OC43 NOT DETECTED NOT DETECTED Final   Metapneumovirus NOT DETECTED NOT DETECTED Final   Rhinovirus / Enterovirus NOT DETECTED NOT DETECTED Final   Influenza A NOT DETECTED NOT DETECTED Final   Influenza B NOT DETECTED NOT DETECTED Final   Parainfluenza Virus 1 NOT DETECTED NOT DETECTED Final   Parainfluenza Virus 2 NOT DETECTED NOT DETECTED Final   Parainfluenza Virus 3 NOT DETECTED NOT DETECTED Final   Parainfluenza Virus 4 NOT DETECTED NOT DETECTED Final   Respiratory Syncytial Virus NOT DETECTED NOT DETECTED Final   Bordetella pertussis NOT DETECTED NOT DETECTED Final   Chlamydophila pneumoniae NOT DETECTED NOT DETECTED Final   Mycoplasma pneumoniae NOT DETECTED NOT DETECTED Final    Comment: Performed at [facility], [address], [place], [place] [address]      Radiology Studies: No results found.    LOS: 3 days   Time spent: More than 50% of that time was spent in counseling and/or coordination of care.  Antonieta Pert, MD [facility]  03/18/2018, 9:13 AM

## 2018-03-18 NOTE — TOC Initial Note (Addendum)
Transition of Care (TOC) - Initial/Assessment Note    Patient Details  Name: Denise [name] MRN: [id number] Date of Birth: [birth year]  Transition of Care [facility]) CM/SW Contact:    Sherrilyn Rist Phone Number: [phone] 03/18/2018, 2:25 PM  Clinical Narrative:                 CM talked to patient and spouse at the bedside; PCP: [name], NP; has private insurance with [facility] with prescription drug coverage; [facility] [address]; DME - home oxygen through [place], [employer]ater and 3:1; [place] choice offered, ( patient does not want SNF placement at this time),  pt [facility]; [place] with [facility] called for arrangements; CM will continue to follow for progression of care.  Expected Discharge Plan: [place] Barriers to Discharge: No Barriers Identified   Patient Goals and CMS Choice Patient states their goals for this hospitalization and ongoing recovery are:: stay at [place] CMS Medicare.gov Compare Post Acute Care list provided to:: Patient Choice offered to / list presented to : Patient, Spouse  Expected Discharge Plan and Services Expected Discharge Plan: [place]     Living arrangements for the past 2 months: Single Family Home                     HH Arranged: RN, Disease Management, PT [place] Agency: [facility] [employer])  Prior Living Arrangements/Services Living arrangements for the past 2 months: [place] with:: Spouse Patient language and need for interpreter reviewed:: No Do you feel safe going back to the place where you live?: Yes      Need for Family Participation in Patient Care: No (Comment) Care giver support system in place?: Yes (comment)   Criminal Activity/Legal Involvement Pertinent to Current Situation/Hospitalization: No - Comment as needed  Activities of Daily Living Home Assistive Devices/Equipment:  Eyeglasses, Dentures (specify type), Oxygen, Walker (specify type) ADL Screening (condition at time of admission) Patient's cognitive ability adequate to safely complete daily activities?: Yes Is the patient deaf or have difficulty hearing?: No Does the patient have difficulty seeing, even when wearing glasses/contacts?: No Does the patient have difficulty concentrating, remembering, or making decisions?: Yes Patient able to express need for assistance with ADLs?: Yes Does the patient have difficulty dressing or bathing?: Yes Independently performs ADLs?: Yes (appropriate for developmental age) Does the patient have difficulty walking or climbing stairs?: Yes Weakness of Legs: Both Weakness of Arms/Hands: Both  Permission Sought/Granted Permission sought to share information with : Case Manager Permission granted to share information with : Yes, Verbal Permission Granted  Share Information with NAME: Kaitlan Bin (Spouse)  Permission granted to share info w AGENCY: [place] agency        Emotional Assessment Appearance:: Appears older than stated age Attitude/Demeanor/Rapport: Gracious Affect (typically observed): Accepting Orientation: : Fluctuating Orientation (Suspected and/or reported [place]) Alcohol / Substance Use: Other (comment) Psych Involvement: No (comment)  Admission diagnosis:  Elevated troponin [R79.89] Acute respiratory failure with hypoxia ([name]) [J96.01] Bilateral lower extremity edema [R60.0] Patient Active Problem List   Diagnosis Date Noted  . Acute exacerbation of CHF (congestive heart failure) ([place]) 03/15/2018  . Lower extremity edema 02/20/2018  . Chronic respiratory failure with hypoxia ([place]) 10/02/2017  . Hematuria 09/27/2017  . SBO (small bowel obstruction) ([place])   . Pulmonary hypertension ([name]) c/w cor pulmonale   . Acute  respiratory failure with hypoxia ([place]) 09/17/2017  . Epigastric pain 09/17/2017  . Protein-calorie malnutrition, severe  09/17/2017  . Abnormal CT scan, gastrointestinal tract   . Constipation   . Other ascites   . Hyperparathyroidism, primary ([place]) 07/27/2016  . Loss of appetite 06/02/2016  . COPD GOLD  II   11/22/2015  . Vitamin D deficiency 11/22/2015  . Anxiety and depression 11/22/2015  . Insomnia 11/22/2015  . Altered mental status 11/22/2015  . Hyperlipidemia 11/22/2015  . Essential hypertension 11/22/2015  . History of adenomatous polyp of colon 02/10/2013  . History of colon cancer 06/11/1986   PCP:  [name], NP Pharmacy[facility]   [id number] [place] [address] [place] [address]Phone: [phone]: [phone]    Social Determinants of Health (SDOH) Interventions    Readmission Risk Interventions 30 Day Unplanned Readmission Risk Score     ED to Hosp-Admission (Current) [place]  30 Day Unplanned Readmission Risk Score (%)  14 Filed at 03/18/2018 1200     This score is the patient's risk of an unplanned readmission within 30 days of being discharged (0 -100%). The score is based on dignosis, age, lab data, medications, orders, and past utilization.   Low:  0-14.9   Medium: 15-21.9   High: 22-29.9   Extreme: 30 and above       No flowsheet data found.

## 2018-03-18 NOTE — Progress Notes (Signed)
PT Cancellation Note  Patient Details Name: Denise [name] MRN: [id number] DOB: [birth year]   Cancelled Treatment:    Reason Eval/Treat Not Completed: Other (comment)(In am, pt being cleaned, pm pt just walked with nurse. )   Godfrey Pick Miyo Aina 03/18/2018, 4:00 PM [place] Pager:  [phone]  Office:  [phone]

## 2018-03-19 ENCOUNTER — Inpatient Hospital Stay (HOSPITAL_COMMUNITY): Payer: Medicare HMO

## 2018-03-19 ENCOUNTER — Encounter: Payer: Self-pay | Admitting: Internal Medicine

## 2018-03-19 ENCOUNTER — Encounter (HOSPITAL_COMMUNITY)
Admission: EM | Disposition: A | Discharge status: Home Health | Payer: Self-pay | Source: Home / Self Care | Attending: Internal Medicine

## 2018-03-19 ENCOUNTER — Ambulatory Visit: Payer: Medicare HMO | Admitting: Endocrinology

## 2018-03-19 DIAGNOSIS — I272 Pulmonary hypertension, unspecified: Secondary | ICD-10-CM

## 2018-03-19 DIAGNOSIS — I2721 Secondary pulmonary arterial hypertension: Secondary | ICD-10-CM

## 2018-03-19 DIAGNOSIS — I5031 Acute diastolic (congestive) heart failure: Secondary | ICD-10-CM

## 2018-03-19 HISTORY — PX: RIGHT/LEFT HEART CATH AND CORONARY ANGIOGRAPHY: CATH118266

## 2018-03-19 LAB — MAGNESIUM: Magnesium: 1.8 mg/dL (ref 1.7–2.4)

## 2018-03-19 LAB — POCT I-STAT EG7
Acid-Base Excess: 9 mmol/L — ABNORMAL HIGH (ref 0.0–2.0)
Acid-Base Excess: 8 mmol/L — ABNORMAL HIGH (ref 0.0–2.0)
Bicarbonate: 36.7 mmol/L — ABNORMAL HIGH (ref 20.0–28.0)
Bicarbonate: 36.1 mmol/L — ABNORMAL HIGH (ref 20.0–28.0)
Calcium, Ion: 1.44 mmol/L — ABNORMAL HIGH (ref 1.15–1.40)
Calcium, Ion: 1.37 mmol/L (ref 1.15–1.40)
HCT: 41 % (ref 36.0–46.0)
HCT: 41 % (ref 36.0–46.0)
Hemoglobin: 13.9 g/dL (ref 12.0–15.0)
Hemoglobin: 13.9 g/dL (ref 12.0–15.0)
O2 Saturation: 66 %
O2 Saturation: 65 %
Potassium: 3.2 mmol/L — ABNORMAL LOW (ref 3.5–5.1)
Potassium: 3.2 mmol/L — ABNORMAL LOW (ref 3.5–5.1)
Sodium: 141 mmol/L (ref 135–145)
Sodium: 141 mmol/L (ref 135–145)
TCO2: 39 mmol/L — AB (ref 22–32)
TCO2: 38 mmol/L — ABNORMAL HIGH (ref 22–32)
pCO2, Ven: 64.7 mmHg — ABNORMAL HIGH (ref 44.0–60.0)
pCO2, Ven: 63.9 mmHg — ABNORMAL HIGH (ref 44.0–60.0)
pH, Ven: 7.362 (ref 7.250–7.430)
pH, Ven: 7.36 (ref 7.250–7.430)
pO2, Ven: 37 mmHg (ref 32.0–45.0)
pO2, Ven: 36 mmHg (ref 32.0–45.0)

## 2018-03-19 LAB — CBC
HCT: 50.6 % — ABNORMAL HIGH (ref 36.0–46.0)
HEMATOCRIT: 45.6 % (ref 36.0–46.0)
Hemoglobin: 14.9 g/dL (ref 12.0–15.0)
Hemoglobin: 16.2 g/dL — ABNORMAL HIGH (ref 12.0–15.0)
MCH: 27.7 pg (ref 26.0–34.0)
MCH: 27.2 pg (ref 26.0–34.0)
MCHC: 32.7 g/dL (ref 30.0–36.0)
MCHC: 32 g/dL (ref 30.0–36.0)
MCV: 84.9 fL (ref 80.0–100.0)
MCV: 85 fL (ref 80.0–100.0)
Platelets: 161 10*3/uL (ref 150–400)
Platelets: 227 10*3/uL (ref 150–400)
RBC: 5.37 MIL/uL — ABNORMAL HIGH (ref 3.87–5.11)
RBC: 5.95 MIL/uL — ABNORMAL HIGH (ref 3.87–5.11)
RDW: 14.6 % (ref 11.5–15.5)
RDW: 14.6 % (ref 11.5–15.5)
WBC: 6.8 10*3/uL (ref 4.0–10.5)
WBC: 8.2 10*3/uL (ref 4.0–10.5)
nRBC: 0 % (ref 0.0–0.2)
nRBC: 0 % (ref 0.0–0.2)

## 2018-03-19 LAB — BASIC METABOLIC PANEL
Anion gap: 12 (ref 5–15)
BUN: 20 mg/dL (ref 8–23)
CHLORIDE: 98 mmol/L (ref 98–111)
CO2: 32 mmol/L (ref 22–32)
Calcium: 10.7 mg/dL — ABNORMAL HIGH (ref 8.9–10.3)
Creatinine, Ser: 1.3 mg/dL — ABNORMAL HIGH (ref 0.44–1.00)
GFR calc Af Amer: 47 mL/min — ABNORMAL LOW (ref >60)
GFR calc non Af Amer: 41 mL/min — ABNORMAL LOW (ref >60)
Glucose, Bld: 262 mg/dL — ABNORMAL HIGH (ref 70–99)
Potassium: 3.8 mmol/L (ref 3.5–5.1)
Sodium: 142 mmol/L (ref 135–145)

## 2018-03-19 LAB — LIPID PANEL
Cholesterol: 209 mg/dL — ABNORMAL HIGH (ref 0–200)
HDL: 46 mg/dL (ref >40)
LDL Cholesterol: 138 mg/dL — ABNORMAL HIGH (ref 0–99)
Total CHOL/HDL Ratio: 4.5 RATIO
Triglycerides: 127 mg/dL (ref <150)
VLDL: 25 mg/dL (ref 0–40)

## 2018-03-19 LAB — GLUCOSE, CAPILLARY
Glucose-Capillary: 254 mg/dL — ABNORMAL HIGH (ref 70–99)
Glucose-Capillary: 269 mg/dL — ABNORMAL HIGH (ref 70–99)
Glucose-Capillary: 120 mg/dL — ABNORMAL HIGH (ref 70–99)
Glucose-Capillary: 222 mg/dL — ABNORMAL HIGH (ref 70–99)
Glucose-Capillary: 196 mg/dL — ABNORMAL HIGH (ref 70–99)

## 2018-03-19 LAB — CREATININE, SERUM
Creatinine, Ser: 1.16 mg/dL — ABNORMAL HIGH (ref 0.44–1.00)
GFR calc Af Amer: 54 mL/min — ABNORMAL LOW (ref >60)
GFR calc non Af Amer: 47 mL/min — ABNORMAL LOW (ref >60)

## 2018-03-19 LAB — TROPONIN I: Troponin I: 0.26 ng/mL (ref <0.03)

## 2018-03-19 SURGERY — RIGHT/LEFT HEART CATH AND CORONARY ANGIOGRAPHY
Anesthesia: LOCAL

## 2018-03-19 MED ORDER — SODIUM CHLORIDE 0.9 % IV SOLN
INTRAVENOUS | Status: AC
  Administered 2018-03-19: 10:00:00 via INTRAVENOUS

## 2018-03-19 MED ORDER — HEPARIN SODIUM (PORCINE) 1000 UNIT/ML IJ SOLN
INTRAMUSCULAR | Status: DC | PRN
  Administered 2018-03-19: 2500 [IU] via INTRAVENOUS

## 2018-03-19 MED ORDER — MIDAZOLAM HCL 2 MG/2ML IJ SOLN
INTRAMUSCULAR | Status: DC | PRN
  Administered 2018-03-19: 0.5 mg via INTRAVENOUS

## 2018-03-19 MED ORDER — CARVEDILOL 6.25 MG PO TABS
6.2500 mg | ORAL_TABLET | Freq: Two times a day (BID) | ORAL | Status: DC
  Administered 2018-03-19 – 2018-03-21 (×5): 6.25 mg via ORAL
  Filled 2018-03-19 (×5): qty 1

## 2018-03-19 MED ORDER — SODIUM CHLORIDE 0.9 % IV SOLN
250.0000 mL | INTRAVENOUS | Status: DC | PRN
  Administered 2018-03-19: 250 mL via INTRAVENOUS

## 2018-03-19 MED ORDER — FENTANYL CITRATE (PF) 100 MCG/2ML IJ SOLN
INTRAMUSCULAR | Status: DC | PRN
  Administered 2018-03-19: 25 ug via INTRAVENOUS

## 2018-03-19 MED ORDER — LIDOCAINE HCL (PF) 1 % IJ SOLN
INTRAMUSCULAR | Status: DC | PRN
  Administered 2018-03-19 (×2): 2 mL via INTRADERMAL

## 2018-03-19 MED ORDER — FENTANYL CITRATE (PF) 100 MCG/2ML IJ SOLN
INTRAMUSCULAR | Status: AC
  Filled 2018-03-19: qty 2

## 2018-03-19 MED ORDER — VERAPAMIL HCL 2.5 MG/ML IV SOLN
INTRAVENOUS | Status: AC
  Filled 2018-03-19: qty 2

## 2018-03-19 MED ORDER — GLIPIZIDE 2.5 MG HALF TABLET
2.5000 mg | ORAL_TABLET | Freq: Every day | ORAL | Status: DC
  Administered 2018-03-19 – 2018-03-21 (×3): 2.5 mg via ORAL
  Filled 2018-03-19 (×3): qty 1

## 2018-03-19 MED ORDER — SODIUM CHLORIDE 0.9% FLUSH: 3.0000 mL | INTRAVENOUS | Status: DC | PRN

## 2018-03-19 MED ORDER — HEPARIN SODIUM (PORCINE) 5000 UNIT/ML IJ SOLN
5000.0000 [IU] | Freq: Three times a day (TID) | INTRAMUSCULAR | Status: DC
  Administered 2018-03-19 – 2018-03-21 (×5): 5000 [IU] via SUBCUTANEOUS
  Filled 2018-03-19 (×4): qty 1

## 2018-03-19 MED ORDER — ONDANSETRON HCL 4 MG/2ML IJ SOLN: 4.0000 mg | Freq: Four times a day (QID) | INTRAMUSCULAR | Status: DC | PRN

## 2018-03-19 MED ORDER — MAGNESIUM SULFATE 2 GM/50ML IV SOLN
2.0000 g | Freq: Once | INTRAVENOUS | Status: AC
  Administered 2018-03-19: 2 g via INTRAVENOUS
  Filled 2018-03-19: qty 50

## 2018-03-19 MED ORDER — SODIUM CHLORIDE 0.9% FLUSH
3.0000 mL | Freq: Two times a day (BID) | INTRAVENOUS | Status: DC
  Administered 2018-03-19 – 2018-03-21 (×5): 3 mL via INTRAVENOUS

## 2018-03-19 MED ORDER — LIDOCAINE HCL (PF) 1 % IJ SOLN
INTRAMUSCULAR | Status: AC
  Filled 2018-03-19: qty 30

## 2018-03-19 MED ORDER — ACETAMINOPHEN 325 MG PO TABS: 650.0000 mg | ORAL_TABLET | ORAL | Status: DC | PRN

## 2018-03-19 MED ORDER — FUROSEMIDE 20 MG PO TABS: 20.0000 mg | ORAL_TABLET | Freq: Every day | ORAL | Status: DC

## 2018-03-19 MED ORDER — VERAPAMIL HCL 2.5 MG/ML IV SOLN
INTRAVENOUS | Status: DC | PRN
  Administered 2018-03-19: 10 mL via INTRA_ARTERIAL

## 2018-03-19 MED ORDER — MIDAZOLAM HCL 2 MG/2ML IJ SOLN
INTRAMUSCULAR | Status: AC
  Filled 2018-03-19: qty 2

## 2018-03-19 MED ORDER — HYDRALAZINE HCL 20 MG/ML IJ SOLN
INTRAMUSCULAR | Status: DC | PRN
  Administered 2018-03-19: 10 mg via INTRAVENOUS

## 2018-03-19 MED ORDER — HYDRALAZINE HCL 20 MG/ML IJ SOLN
INTRAMUSCULAR | Status: AC
  Filled 2018-03-19: qty 1

## 2018-03-19 MED ORDER — IOHEXOL 350 MG/ML SOLN
INTRAVENOUS | Status: DC | PRN
  Administered 2018-03-19: 40 mL via INTRACARDIAC

## 2018-03-19 MED ORDER — FUROSEMIDE 20 MG PO TABS
20.0000 mg | ORAL_TABLET | Freq: Every day | ORAL | Status: DC
  Administered 2018-03-20 – 2018-03-21 (×2): 20 mg via ORAL
  Filled 2018-03-19 (×2): qty 1

## 2018-03-19 MED ORDER — HEPARIN (PORCINE) IN NACL 1000-0.9 UT/500ML-% IV SOLN
INTRAVENOUS | Status: DC | PRN
  Administered 2018-03-19 (×2): 500 mL

## 2018-03-19 MED ORDER — AMLODIPINE BESYLATE 5 MG PO TABS
5.0000 mg | ORAL_TABLET | Freq: Every day | ORAL | Status: DC
  Administered 2018-03-19 – 2018-03-20 (×2): 5 mg via ORAL
  Filled 2018-03-19 (×2): qty 2

## 2018-03-19 MED ORDER — HEPARIN (PORCINE) IN NACL 1000-0.9 UT/500ML-% IV SOLN
INTRAVENOUS | Status: AC
  Filled 2018-03-19: qty 1000

## 2018-03-19 MED ORDER — PREDNISONE 20 MG PO TABS
20.0000 mg | ORAL_TABLET | Freq: Every day | ORAL | Status: DC
  Administered 2018-03-20 – 2018-03-21 (×2): 20 mg via ORAL
  Filled 2018-03-19 (×2): qty 1

## 2018-03-19 SURGICAL SUPPLY — 11 items

## 2018-03-19 NOTE — Progress Notes (Signed)
Patient is back from cath.  Alert and oriented to self and situation.   03/19/18 0914  First Vascular Site Assessment  #1 - Location of Site Assessment Right radial  #1 - Vascular Site Assessment Scale Level 0  #1 - Air in TR Band 14 cc     03/19/18 0911  Vitals  BP (!) 150/94  MAP (mmHg) 109  BP Method Automatic  Pulse Rate 63  Pulse Rate Source Monitor  Resp 18  Oxygen Therapy  SpO2 94 %  O2 Device Nasal Cannula  O2 Flow Rate (L/min) 2 L/min  MEWS Score  MEWS RR 0  MEWS Pulse 0  MEWS Systolic 0  MEWS LOC 0  MEWS Temp 0  MEWS Score 0  MEWS Score Color Nyoka Cowden

## 2018-03-19 NOTE — Assessment & Plan Note (Signed)
Assoc with ? Cor pulmonale dx 09/2017 so rx 02 1-2 lpm 24/7  - 10/17/2017  Walked RA x 3 laps @ 185 ft each stopped due to  End of study, nl pace,  desat to 87 on 3rd lap - 12/19/2017  Saturations on Room Air at Rest =91 % and while Ambulating = 87%  But  on  2 Liters of pulsed oxygen while Ambulating =93% so rec POC 2lpm walking / use at rest if sats under 90%    Critical given obvious elevations in R heart pressure that she monitor 02 sats and maintain > 90% at all times /advisded

## 2018-03-19 NOTE — Assessment & Plan Note (Signed)
Quit smoking 1989 - Spirometry 09/11/2016  FEV1 1.03 (52%)  Ratio 54 mild curvature, on no rx - 09/11/2016  After extensive coaching HFA effectiveness =    50% from baseline 10% > continue  symb 160 2bid but consider change to bevespi or stiolto  - 10/02/2017  After extensive coaching inhaler device,  effectiveness =    50% from a baseline near 0 > rechallenge with symbicort 160 x 2 bid x 2 weeks then re-group  - 10/05/2017    try stiolto x 2pffs x 2 week sample  - PFT's  11/01/2017  FEV1 0.95 (52 % ) ratio fev1/vc = 69%  p 3 % improvement from saba p ? Stiolto(not sure she used it)  prior to study with DLCO  30/30c % corrects to 59 % for alv volume  With air trapping pattern  confirmed on cxr as well  03/12/2018  After extensive coaching inhaler device,  effectiveness =    75% (baseline 50%) with limited Ti  Pt is Group B in terms of symptom/risk and laba/lama therefore appropriate rx at this point >>>  Really might benefit more from neb lama/laba but her insurance won't cover the 20% that Medicare part B won't cover so if can't master smi next ov will consider DPI/elipta   Whatever she uses, she and fm need a better way to do med reconciliation.  To keep things simple, I have asked the patient to first separate medicines that are perceived as maintenance, that is to be taken daily "no matter what", from those medicines that are taken on only on an as-needed basis and I have given the patient examples of both, and then return to see our NP to generate a  detailed  medication calendar which should be followed until the next physician sees the patient and updates it.

## 2018-03-19 NOTE — Progress Notes (Signed)
Advanced Heart Failure Rounding Note  PCP-Cardiologist: No primary care provider on file.   Subjective:    No complaints this morning, breathing is ok.  Able to lie flat on cath table.   LHC/RHC:  Diagnostic  Dominance: Right  Left Main  No significant disease.  Left Anterior Descending  Luminal irregularities.  Left Circumflex  Luminal irregularities.  Right Coronary Artery  Luminal irregularities.  Intervention   No interventions have been documented.  Right Heart   Right Heart Pressures RHC Procedural Findings: Hemodynamics (mmHg) RA mean 7 RV 65/9 PA 59/22, mean 36 PCWP mean 9 LV 179/12 AO 176/95  Oxygen saturations: PA 66% AO 99%  Cardiac Output (Fick) 3.15  Cardiac Index (Fick) 2  PVR 8.6 WU      Objective:   Weight Range: 51.3 kg Body mass index is 17.73 kg/m.   Vital Signs:   Temp:  [98 F (36.7 C)] 98 F (36.7 C) (03/17 0258) Pulse Rate:  [58-75] 61 (03/17 0830) Resp:  [0-43] 17 (03/17 0830) BP: (124-202)/(75-136) 168/101 (03/17 0830) SpO2:  [88 %-100 %] 98 % (03/17 0830) Weight:  [51.3 kg] 51.3 kg (03/17 0258) Last BM Date: 03/17/18  Weight change: Filed Weights   03/17/18 0629 03/18/18 0639 03/19/18 0258  Weight: 53.5 kg 50.4 kg 51.3 kg    Intake/Output:   Intake/Output Summary (Last 24 hours) at 03/19/2018 0858 Last data filed at 03/18/2018 2154 Gross per 24 hour  Intake 293 ml  Output 300 ml  Net -7 ml      Physical Exam    General:  Well appearing. No resp difficulty HEENT: Normal Neck: Supple. JVP not elevated. Carotids 2+ bilat; no bruits. No lymphadenopathy or thyromegaly appreciated. Cor: PMI nondisplaced. Regular rate & rhythm. No rubs, gallops or murmurs. Lungs: Clear Abdomen: Soft, nontender, nondistended. No hepatosplenomegaly. No bruits or masses. Good bowel sounds. Extremities: No cyanosis, clubbing, rash, edema Neuro: Alert & orientedx3, cranial nerves grossly intact. moves all 4 extremities w/o  difficulty. Affect pleasant   Telemetry   NSR with PACs (personally reviewed)  Labs    CBC Recent Labs    03/18/18 0628 03/19/18 0433  WBC 6.2 6.8  HGB 13.4 14.9  HCT 43.3 45.6  MCV 86.3 84.9  PLT 169 940   Basic Metabolic Panel Recent Labs    03/17/18 0847 03/18/18 0628 03/18/18 1631 03/19/18 0433  NA 139 142  --  142  K 5.0 4.0  --  3.8  CL 103 105  --  98  CO2 33* 30  --  32  GLUCOSE 406* 176*  --  262*  BUN 19 18  --  20  CREATININE 1.22* 0.96  --  1.30*  CALCIUM 10.7* 10.9*  --  10.7*  MG 1.1*  --  3.5*  --    Liver Function Tests No results for input(s): AST, ALT, ALKPHOS, BILITOT, PROT, ALBUMIN in the last 72 hours. No results for input(s): LIPASE, AMYLASE in the last 72 hours. Cardiac Enzymes Recent Labs    03/18/18 1115 03/18/18 1631 03/18/18 2318  TROPONINI 0.29* 0.26* 0.26*    BNP: BNP (last 3 results) Recent Labs    09/16/17 2005 03/15/18 2008  BNP 310.4* 1,914.0*    ProBNP (last 3 results) Recent Labs    02/20/18 1236 03/01/18 1218  PROBNP 1,057.0* 1,336.0*     D-Dimer No results for input(s): DDIMER in the last 72 hours. Hemoglobin A1C Recent Labs    03/17/18 1005  HGBA1C 9.3*  Fasting Lipid Panel Recent Labs    03/19/18 0433  CHOL 209*  HDL 46  LDLCALC 138*  TRIG 127  CHOLHDL 4.5   Thyroid Function Tests No results for input(s): TSH, T4TOTAL, T3FREE, THYROIDAB in the last 72 hours.  Invalid input(s): FREET3  Other results:   Imaging     No results found.   Medications:     Scheduled Medications: . [MAR Hold] alum & mag hydroxide-simeth  15 mL Oral Once  . amLODipine  5 mg Oral Daily  . [MAR Hold] aspirin EC  81 mg Oral Daily  . [MAR Hold] azithromycin  500 mg Oral Q24H  . carvedilol  6.25 mg Oral BID WC  . [MAR Hold] cefdinir  300 mg Oral Q12H  . [MAR Hold] cinacalcet  30 mg Oral Q M,W,F  . [MAR Hold] enoxaparin (LOVENOX) injection  40 mg Subcutaneous Q24H  . [MAR Hold] famotidine  40 mg  Oral BID  . [MAR Hold] feeding supplement (ENSURE ENLIVE)  237 mL Oral BID BM  . [MAR Hold] fluticasone furoate-vilanterol  1 puff Inhalation Daily  . [START ON 03/20/2018] furosemide  20 mg Oral Daily  . [MAR Hold] insulin aspart  0-5 Units Subcutaneous QHS  . [MAR Hold] insulin aspart  0-9 Units Subcutaneous TID WC  . [MAR Hold] ipratropium-albuterol  3 mL Nebulization TID  . [MAR Hold] losartan  100 mg Oral Daily  . [MAR Hold] mirtazapine  15 mg Oral QHS  . [MAR Hold] multivitamin with minerals  1 tablet Oral Daily  . [MAR Hold] pantoprazole  40 mg Oral Daily  . [MAR Hold] predniSONE  40 mg Oral Q breakfast  . [MAR Hold] sodium chloride flush  3 mL Intravenous Q12H     Infusions: . [MAR Hold] sodium chloride 10 mL/hr at 03/16/18 2322  . sodium chloride 10 mL/hr at 03/19/18 7017     PRN Medications:  [MAR Hold] sodium chloride, [MAR Hold] acetaminophen, [MAR Hold] calcium carbonate, [MAR Hold] Muscle Rub, [MAR Hold] ondansetron (ZOFRAN) IV, [MAR Hold] sodium chloride flush     Assessment/Plan   1. PNA in setting of COPD: Afebrile, WBCs not elevated. She is being treated with prednisone 40 mg daily and cefdinir/azithromycin. PCT not elevated.  2. Acute on chronic primarily diastolic CHF: With prominent RV failure.  Echo this admission with EF down to 45-50%, LVH, moderate RV systolic dysfunction.  She has diuresed this admission with improving dyspnea.  There is concern based on appearance of echo for infiltrative disease.  On RHC/LHC today, filling pressures are near normal now but there is moderate pulmonary arterial hypertension.  - Transition to po Lasix, start 20 mg daily tomorrow.   - Cardiac MRI to look for evidence of cardiac amyloidosis => done, read pending.  3. CAD: Mild troponin elevation this admission at 0.44.  She has had atypical chest pain and has some new T wave changes anteriorly.  Echo down to 45-50% from 60-65% prior.  Coronary angiography was done today, only  luminal irregularities.   4. Pulmonary hypertension: RHC in 2019, thought to have group 3 PH due to lung disease/COPD. However, it has been been many years since she smoked and despite abnormal PFTs, we do not have recent lung imaging.  V/Q scan in 9/19 showed no chronic PE. Based on today's RHC, I think it would be reasonable to revisit whether we attempt treatment with pulmonary vasodilators for possible group 1 PH component.  - High resolution CT chest: If she does  not have advanced parenchymal disease, would consider trial of pulmonary vasodilators.  - Will send serological workup with ANA, RF, anti-centromere, HIV, anti-SCL70.   Length of Stay: [place], MD  03/19/2018, 8:58 AM  Advanced Heart Failure Team Pager [phone] (M-F; 7a - 4p)  Please contact [facility] for night-coverage after hours (4p -7a ) and weekends on [email or web address]

## 2018-03-19 NOTE — Progress Notes (Signed)
PT Cancellation Note  Patient Details Name: Denise [name] MRN: [id number] DOB: [birth year]   Cancelled Treatment:    Reason Eval/Treat Not Completed: Patient not medically ready Pt just returned from cath. Will follow.   Marguarite Arbour A Shannell Mikkelsen 03/19/2018, 9:38 AM Wray Kearns, PT, DPT Acute Rehabilitation Services Pager [phone] Office [phone]

## 2018-03-19 NOTE — Progress Notes (Addendum)
Inpatient Diabetes Program Recommendations  AACE/ADA: New Consensus Statement on Inpatient Glycemic Control (2015)  Target Ranges:  Prepandial:   less than 140 mg/dL      Peak postprandial:   less than 180 mg/dL (1-2 hours)      Critically ill patients:  140 - 180 mg/dL   Results for AARON, BOEH (MRN [id number]) as of 03/19/2018 09:55  Ref. Range 03/18/2018 06:36 03/18/2018 11:20 03/18/2018 16:34 03/18/2018 21:21  Glucose-Capillary Latest Ref Range: 70 - 99 mg/dL 162 (H) 238 (H)  3 units NOVOLOG  254 (H)  3 units NOVOLOG  334 (H)  4 units NOVOLOG    Results for RHYLIE, STEHR (MRN [id number]) as of 03/19/2018 09:55  Ref. Range 03/19/2018 06:37  Glucose-Capillary Latest Ref Range: 70 - 99 mg/dL 269 (H)  5 units NOVOLOG    Results for MISHAAL, LANSDALE (MRN [id number]) as of 03/19/2018 09:55  Ref. Range 09/25/2017 10:34 03/17/2018 10:05  Hemoglobin A1C Latest Ref Range: 4.8 - 5.6 % 5.7 (H) 9.3 (H)  (220 mg/dl)     Home DM Meds: None  Current Orders: Novolog Sensitive Correction Scale/ SSI (0-9 units) TID AC + HS    PCP: Alma Friendly, NP with [facility]  Getting Prednisone 40 mg Daily.  Pt got Depomedrol 120 mg IM at her Pulmonology Appt on 03/12/2018 which may have affected her current A1c.  Has also been taking Prednisone taper at home (see PCP notes from 03/01/2018).    MD- Please consider the following in-hospital insulin adjustments to help lower glucose levels:  1. Start weight based dose of Lantus: Lantus 8 units Daily (0.15 units/kg based on weight of 51 kg)   2. Once patient resumes solid PO diet today, Consider Starting Novolog Meal Coverage: Novolog 3 units TID with meals  (Please add the following Hold Parameters: Hold if pt eats <50% of meal, Hold if pt NPO)   3. Does patient need medication for CBG control at home?  Note that she has been taking Prednisone at home and has also gotten IM injection of Depomedrol.  Perhaps she would  benefit from [place] for home?  Jardiance can be initiated at 10 mg Once Daily as long as GFR is >45 ml/min     Addendum 11am- Met with pt and her husband today to discuss current A1c of 9.3% (elevated from 5.7% back in September 2019).  Pt told me she does not take anything for her CBGs at home.  Has been taking Prednisone and also got IM injection of steroid with Dr. Melvyn Novas with Pulmonology.  Pt and Husband state she has a CBG meter at home.  Does not regularly check CBGs b/c pt states that her PCP told her her CBGs were better and that she didn't need to check anymore.  Discussed with pt and husband that recent steroid use has likely led to elevated CBGs at home.  Discussed with pt that Attending Dr. Maren Beach has added a medication called Glipizide and that he will likely d/c her home on the Glipizide.  Explained how Glipizide works.  Strongly encouraged pt and husband to check pt's CBGs at least 1-2 times per day (1st thing in the AM and vary the 2nd check of the day).  Reviewed CBG goals for home.  Strongly encouraged pt to call her PCP after d/c to let them know that the Attending MD in the hospital added Glipizide to her home meds.  Advised pt to call her PCP if she continues to have  CBGs >200 mg/dl or if she has Hypoglycemia at home.  Reviewed Hypoglycemia signs and symptoms and treatment as well.    --Will follow patient during hospitalization--  Wyn Quaker RN, MSN, CDE Diabetes Coordinator Inpatient Glycemic Control Team Team Pager: [phone] (8a-5p)

## 2018-03-19 NOTE — Assessment & Plan Note (Signed)
See Basile 09/20/17  With  low CO ? Accuracy of PVR    F/u planned with Dr Aundra Dubin    I had an extended discussion with the patient reviewing all relevant studies completed to date and  lasting 15 to 20 minutes of a 25 minute visit    See device teaching which extended face to face time for this visit.  Each maintenance medication was reviewed in detail including emphasizing most importantly the difference between maintenance and prns and under what circumstances the prns are to be triggered using an action plan format that is not reflected in the computer generated alphabetically organized AVS which I have not found useful in most complex patients, especially with respiratory illnesses  Please see AVS for specific instructions unique to this visit that I personally wrote and verbalized to the the pt in detail and then reviewed with pt  by my nurse highlighting any  changes in therapy recommended at today's visit to their plan of care.

## 2018-03-19 NOTE — Progress Notes (Signed)
[name] BP was elevated 186/136. [facility] was paged and placed an order for IV hydralazine. Rechecked pt's BP before giving Hydralazine and BP was 124/75. Paged [facility] again and MD discontinued the hydralazine. Will continue to monitor.

## 2018-03-19 NOTE — Evaluation (Signed)
Physical Therapy Evaluation Patient Details Name: Denise [name]. Whitworth MRN: [id number] DOB: 09-19-1945 Today's Date: 03/19/2018   History of Present Illness  Patient is a 73 y/o female who presents with SOB, CP and weakness. CXR- left lingular PNA. Admitted with acute on Chronic diastolic CHF exacerbation with Pulm hypertension and respiratory failure. s/p cath 3/17. PMH includes COPD, chronic hypoxic respiratory failure on 2 L home oxygen, HTN, DM, HLD, colon cancer, pulmonary HTN.  Clinical Impression  Patient presents with generalized weakness, confusion, impaired balance, decreased activity tolerance and impaired mobility s/p above. Tolerated transfers and gait training with min guard-Min A for balance/safety. Pt with difficulty navigating RW esp with turns. Poor safety awareness. Sp02 dropped to low 80s on 4L/min 02 during ambulation. Able to improve with cues for pursed lip breathing. Pt independent PTA living with spouse. Per sisters, pt's cognition has been getting worse lately esp memory and orientation. Will follow acutely to maximize independence and mobility prior to return home.     Follow Up Recommendations Home health PT;Supervision for mobility/OOB    Equipment Recommendations  Rolling walker with 5" wheels    Recommendations for Other Services       Precautions / Restrictions Precautions Precautions: Fall Precaution Comments: watch 02 Restrictions Weight Bearing Restrictions: No      Mobility  Bed Mobility Overal bed mobility: Needs Assistance Bed Mobility: Supine to Sit     Supine to sit: Min guard;HOB elevated     General bed mobility comments: Use of rail, able to get to EOB without assist.   Transfers Overall transfer level: Needs assistance Equipment used: Rolling walker (2 wheeled) Transfers: Sit to/from Stand Sit to Stand: Min guard         General transfer comment: Min guard for safety. Stood from [employer].   Ambulation/Gait Ambulation/Gait  assistance: Min assist Gait Distance (Feet): 120 Feet Assistive device: Rolling walker (2 wheeled) Gait Pattern/deviations: Step-through pattern;Decreased stride length;Trunk flexed;Drifts right/left;Narrow base of support Gait velocity: decreased   General Gait Details: Slow, unsteady gait wtih cues for RW management as pt walking on outside of walker, difficulty with turns. Sp02 dropped to low 80s on 4L/min 02. Cues for pursed lip breathing.   Stairs            Wheelchair Mobility    Modified Rankin (Stroke Patients Only)       Balance Overall balance assessment: Needs assistance Sitting-balance support: Feet supported;No upper extremity supported Sitting balance-Leahy Scale: Good Sitting balance - Comments: Able to reach outside BoS and donn socks without difficulty.    Standing balance support: During functional activity Standing balance-Leahy Scale: Poor Standing balance comment: Requires BUE support in standing.                              Pertinent Vitals/Pain Pain Assessment: No/denies pain    Home Living Family/patient expects to be discharged to:: Private residence Living Arrangements: Spouse/significant other Available Help at Discharge: Family;Available 24 hours/day Type of Home: House Home Access: Stairs to enter Entrance Stairs-Rails: Right Entrance Stairs-Number of Steps: 4 Home Layout: One level;Laundry or work area in [place]: Environmental consultant - 4 wheels      Prior Function Level of Independence: Independent         Comments: Does her own self care. Does little cooking.     Hand Dominance        Extremity/Trunk Assessment   Upper Extremity Assessment Upper Extremity  Assessment: Defer to OT evaluation    Lower Extremity Assessment Lower Extremity Assessment: Generalized weakness       Communication   Communication: No difficulties  Cognition Arousal/Alertness: Awake/alert Behavior During Therapy: WFL for  tasks assessed/performed Overall Cognitive Status: Impaired/Different from baseline Area of Impairment: Orientation;Memory;Following commands;Safety/judgement;Awareness;Problem solving                 Orientation Level: Disoriented to;Place;Situation   Memory: Decreased short-term memory Following Commands: Follows multi-step commands inconsistently;Follows one step commands with increased time;Follows one step commands inconsistently Safety/Judgement: Decreased awareness of safety;Decreased awareness of deficits Awareness: Intellectual Problem Solving: Slow processing;Requires verbal cues General Comments: "I did not even know i was in the hospital." trying to ask pt questions, however sister continually answering for her and interrupting PT , "I got this, I got her, we are good." Repetition to follow commands and complete tasks. Slow to process. per other sister, this has been getting progessively worse lately.      General Comments General comments (skin integrity, edema, etc.): 2 sisters present during session.    Exercises     Assessment/Plan    PT Assessment Patient needs continued PT services  PT Problem List Decreased strength;Decreased balance;Decreased cognition;Cardiopulmonary status limiting activity;Decreased mobility;Decreased safety awareness;Decreased activity tolerance       PT Treatment Interventions Functional mobility training;Balance training;Patient/family education;Gait training;Therapeutic activities;Therapeutic exercise;Cognitive remediation;DME instruction    PT Goals (Current goals can be found in the Care Plan section)  Acute Rehab PT Goals Patient Stated Goal: none stated PT Goal Formulation: With patient Time For Goal Achievement: 04/02/18 Potential to Achieve Goals: Good    Frequency Min 3X/week   Barriers to discharge        Co-evaluation               AM-PAC PT "6 Clicks" Mobility  Outcome Measure Help needed turning from  your back to your side while in a flat bed without using bedrails?: None Help needed moving from lying on your back to sitting on the side of a flat bed without using bedrails?: A Little Help needed moving to and from a bed to a chair (including a wheelchair)?: A Little Help needed standing up from a chair using your arms (e.g., wheelchair or bedside chair)?: A Little Help needed to walk in hospital room?: A Little Help needed climbing 3-5 steps with a railing? : A Little 6 Click Score: 19    End of Session Equipment Utilized During Treatment: Oxygen;Gait belt Activity Tolerance: Treatment limited secondary to medical complications (Comment)(drop in Sp02) Patient left: in bed;with call bell/phone within reach;with family/visitor present(sitting EOB with sister in room) Nurse Communication: Mobility status(02 sats) PT Visit Diagnosis: Difficulty in walking, not elsewhere classified (R26.2);Unsteadiness on feet (R26.81)    Time: 1425-1450 PT Time Calculation (min) (ACUTE ONLY): 25 min   Charges:   PT Evaluation $PT Eval Moderate Complexity: 1 Mod PT Treatments $Gait Training: 8-22 mins        Wray Kearns, [place], DPT Acute Rehabilitation Services Pager [phone] Office [place] 03/19/2018, 4:25 PM

## 2018-03-19 NOTE — Progress Notes (Signed)
PROGRESS NOTE    Denise [name] Plan  DHR:[id number] DOB: 21-Jun-1945 DOA: 03/15/2018 PCP: [name], NP   Brief Narrative: 73 year old female with history of COPD, chronic hypoxic respiratory failure on 2 L home oxygen, hypertension, diabetes, hyperlipidemia, history of colon cancer, pulmonary hypertension presenting for evaluation of shortness of breath for last 1 month which is worse in the past 1 week.  Patient was complained of chest tightness chest pain, shortness of breath worsening leg swelling.  She ran out of her inhaler for about a week and was only taking prednisone.  In the ER she was 86% on 2 L nasal cannula, chest x-ray showed lingular pneumonia, elevated troponin, and was admitted for further management  Subjective: Seen and examined came back from heart cath this morning. Reports swelling in the leg improving, now able to dorsiflex her ankle.  Denies chest pain or shortness of breath.  Assessment & Plan:  Acute on Chronic diastolic CHF exacerbation.with Pulm hypertension: Patient on admission significant leg edema, BNP 1914, weight 125 pound--> now 113, total neg 2.3 littre. Patient reports her weight has been in the range of 130 pound for past several months. Still w leg edema and improving. Echo shows LVEF 45 to 50%, asymmetric septal hypertrophy myocardial appearance suggestive of infiltrative process, diffuse hypokinesia with restrictive diastolic dysfunction.  appreciate cardiology inputs.  S/p heart cath today and has "Moderate pulmonary arterial hypertension with PVR 8.5 WU ). Cont on Lasix, losartan and Coreg.  Creatinine slightly up, monitor. Discontinued lisinopril 3/15.  Very much appreciate cardiology inputs.  Acute on chronic hypoxic respiratory failure in the setting of severe COPD with acute exacerbation, pneumonia and CHF exacerbation.  Currently on 2 L nasal cannula, home setting.  Continue diuresis bronchodilators and steroids w taper.  She follows up  with Dr. Melvyn Novas as outpatient  Abnormal echocardiogram suspicious for infiltrative disease.  Cardiac MRI ordered.  Left lingular pneumonia /community-acquired pneumonia: So far work-up with negative blood culture, influenza screen and respiratory virus panel negative. Switched to po omnicef/azithro 3/15 to complete course.. Cont bronchodilators, supplemental oxygen.   Acute COPD exacerbation w PULM HTN, cont prednisone taper, bronchodilators with DuoNeb supplemental  oxygen as above. Add breo daily.  Severe pulmonary hypertension: HRCT ordered to look for ILD along with serologies/autoimmune work-up. Cont per CHF team.    Altered mental status/acute encephalopathy likely toxic metabolic in the setting of pneumonia respiratory failure hypoxia.  She appears to be much more alert awake and oriented.  Patient seems to have some baseline underlying cognitive issues forgetfulness, and now worsened in the hospital. husband is at the bedside 24/7 providing care.  Refused one-to-one. Cont on fall and cont supportive care. PT eval.  Elevated troponin 0.44-->0.2 in the setting of CHF, hypoxic respiratory failure. echocardiogram reviewed-suspecting possible infiltrative process, MRI report pending.  Seen by cardiology and underwent cardiac cath with no significant obstructive disease  Hypokalemia/hypomagnesemia:was repleted. Improved  Mild AKI, creatinine  At 1.3, monitor closely while on diuresis.  Also on losartan. Recent Labs  Lab 03/15/18 2008 03/16/18 4680 03/17/18 0847 03/18/18 0628 03/19/18 0433  CREATININE 0.91 0.82 1.22* 0.96 1.30*   Hypertension: BP borderline controlled.  Adding amlodipine, metoprolol switched to Coreg, continue losartan, monitor and adjust  Diabetes mellitus Type II , not on insulin or  Any meds at home.  A1c is at  9 . accu-check poorly  controlled.cont ssi. Wean off steroid quickly. Start glipizide and uptitrate  DVT prophylaxis: SCD. Lovenox stopped for cath. Code  Status: FULL CODE Family Communication: updated HUSBAND at bedside Disposition Plan: remains inpatient pending clinical improvement and clearance from cardiology.  Plan for PT evaluation and encourage ambulation.  Consultants:  Cardiology  Microbiology Influenza screen negative Blood culture 3/13 no growth to date  Procedures:  CXR 3/13 Left lower lobe and lingular pneumonia. 2. Stable cardiomegaly and changes of COPD and chronic bronchitis  TTE 3/14  The left ventricle has mildly reduced systolic function, with an ejection fraction of 45-50%. Asymmetric septal hypertrophy (moderate septal hypertrophy and otherwise mild hypertrophy of remaining myocardium). Myocardial appearance is suggestive of  infiltrative process. Diffuse hypokinesis with restrictive diastolic dysfunction and elevated filling pressures.  2. The mitral valve is abnormal. Mild thickening of the mitral valve leaflet.  3. Mildly thickened tricuspid valve leaflets.  4. The tricuspid valve is abnormal. Tricuspid valve regurgitation is mild-moderate.  5. The aortic valve is tricuspid Mild thickening of the aortic valve Aortic valve regurgitation is mild by color flow Doppler.  6. The right ventricle has moderately reduced systolic function. The cavity was mildly enlarged. There is mildly increased right ventricular wall thickness.  7. Left atrial size was mildly dilated.  8. Right atrial size was mildly dilated.  9. The aortic root is normal in size and structure.  Lt/rt heart cath 3/17  1. Near-normal filling pressures.   2. Moderate pulmonary arterial hypertension with PVR 8.5 WU (significantly elevated).  3. Low cardiac output.  4. Mild luminal irregularities on coronary angiogram.   Antimicrobials: Anti-infectives (From admission, onward)   Start     Dose/Rate Route Frequency Ordered Stop   03/18/18 1800  cefdinir (OMNICEF) capsule 300 mg     300 mg Oral Every 12 hours 03/18/18 0913 03/23/18 2159   03/17/18  1900  azithromycin (ZITHROMAX) tablet 500 mg     500 mg Oral Every 24 hours 03/17/18 0811 03/22/18 1859   03/16/18 1700  cefTRIAXone (ROCEPHIN) 1 g in sodium chloride 0.9 % 100 mL IVPB  Status:  Discontinued     1 g 200 mL/hr over 30 Minutes Intravenous Every 24 hours 03/15/18 1850 03/18/18 0913   03/15/18 1900  cefTRIAXone (ROCEPHIN) 1 g in sodium chloride 0.9 % 100 mL IVPB  Status:  Discontinued     1 g 200 mL/hr over 30 Minutes Intravenous Every 24 hours 03/15/18 1850 03/15/18 1850   03/15/18 1900  azithromycin (ZITHROMAX) 500 mg in sodium chloride 0.9 % 250 mL IVPB  Status:  Discontinued     500 mg 250 mL/hr over 60 Minutes Intravenous Every 24 hours 03/15/18 1850 03/17/18 0811   03/15/18 1815  cefTRIAXone (ROCEPHIN) 1 g in sodium chloride 0.9 % 100 mL IVPB     1 g 200 mL/hr over 30 Minutes Intravenous  Once 03/15/18 1801 03/15/18 1851   03/15/18 1815  doxycycline (VIBRA-TABS) tablet 100 mg     100 mg Oral  Once 03/15/18 1801 03/15/18 1820       Objective: Vitals:   03/19/18 0820 03/19/18 0825 03/19/18 0830 03/19/18 0911  BP: (!) 161/106 (!) 165/105 (!) 168/101 (!) 150/94  Pulse: 62 (!) 58 61 63  Resp: (!) _0 Temp:      TempSrc:      SpO2: 98% 96% 98% 94%  Weight:      Height:        Intake/Output Summary (Last 24 hours) at 03/19/2018 1036 Last data filed at 03/18/2018 2154 Gross per 24 hour  Intake 53 ml  Output  300 ml  Net -247 ml   Filed Weights   03/17/18 0629 03/18/18 0639 03/19/18 0258  Weight: 53.5 kg 50.4 kg 51.3 kg   Weight change: 0.953 kg  Body mass index is 17.73 kg/m.  Intake/Output from previous day: 03/16 0701 - 03/17 0700 In: 533 [P.O.:480; I.V.:3; IV Piggyback:50] Out: 300 [Urine:300] Intake/Output this shift: No intake/output data recorded.  Examination: General exam: Calm, comfortable, not in acute distress, frail, older for age, thin built.  HEENT:Oral mucosa moist, Ear/Nose WNL grossly, dentition normal. Respiratory system:  Bilateral diminished breath sound, no use of accessory muscle, non tender on palpation. Cardiovascular system: regular rate and rhythm, S1 & S2 heard, No JVD/murmurs. Gastrointestinal system: Abdomen soft, non-tender, non-distended, BS +. No hepatosplenomegaly palpable. Nervous System:Alert, awake and oriented at baseline. Able to move UE and LE, sensation intact. Extremities: Bilateral 2+ pitting edema, distal peripheral pulses palpable.  Skin: No rashes,no icterus. MSK: Normal muscle bulk,tone, power  Medications:  Scheduled Meds: . alum & mag hydroxide-simeth  15 mL Oral Once  . amLODipine  5 mg Oral Daily  . aspirin EC  81 mg Oral Daily  . azithromycin  500 mg Oral Q24H  . carvedilol  6.25 mg Oral BID WC  . cefdinir  300 mg Oral Q12H  . cinacalcet  30 mg Oral Q M,W,F  . famotidine  40 mg Oral BID  . feeding supplement (ENSURE ENLIVE)  237 mL Oral BID BM  . fluticasone furoate-vilanterol  1 puff Inhalation Daily  . [START ON 03/20/2018] furosemide  20 mg Oral Daily  . heparin  5,000 Units Subcutaneous Q8H  . insulin aspart  0-5 Units Subcutaneous QHS  . insulin aspart  0-9 Units Subcutaneous TID WC  . ipratropium-albuterol  3 mL Nebulization TID  . losartan  100 mg Oral Daily  . mirtazapine  15 mg Oral QHS  . multivitamin with minerals  1 tablet Oral Daily  . pantoprazole  40 mg Oral Daily  . predniSONE  40 mg Oral Q breakfast  . sodium chloride flush  3 mL Intravenous Q12H  . sodium chloride flush  3 mL Intravenous Q12H   Continuous Infusions: . sodium chloride 10 mL/hr at 03/16/18 2322  . sodium chloride    . sodium chloride      Data Reviewed: I have personally reviewed following labs and imaging studies  CBC: Recent Labs  Lab 03/15/18 1703 03/15/18 2008 03/18/18 0628 03/19/18 0433  WBC  --  8.1 6.2 6.8  HGB 9.5* 14.5 13.4 14.9  HCT 28.0* 47.6* 43.3 45.6  MCV  --  88.1 86.3 84.9  PLT  --  181 169 323   Basic Metabolic Panel: Recent Labs  Lab 03/15/18  1652 03/15/18 1703 03/15/18 2008 03/16/18 0412 03/16/18 0918 03/17/18 0847 03/18/18 0628 03/18/18 1631 03/19/18 0433  NA 143 141  --  142  --  139 142  --  142  K 4.0 3.9  --  2.8*  --  5.0 4.0  --  3.8  CL 104  --   --  100  --  103 105  --  98  CO2 28  --   --  32  --  33* 30  --  32  GLUCOSE 287*  --   --  256*  --  406* 176*  --  262*  BUN 13  --   --  12  --  19 18  --  20  CREATININE 0.94  --  0.91  0.82  --  1.22* 0.96  --  1.30*  CALCIUM 10.6*  --   --  10.1  --  10.7* 10.9*  --  10.7*  MG  --   --   --   --  1.1* 1.1*  --  3.5*  --    GFR: Estimated Creatinine Clearance: 31.7 mL/min (A) (by C-G formula based on SCr of 1.3 mg/dL (H)). Liver Function Tests: Recent Labs  Lab 03/15/18 1652  AST 35  ALT 24  ALKPHOS 106  BILITOT 1.4*  PROT 5.5*  ALBUMIN 3.3*   No results for input(s): LIPASE, AMYLASE in the last 168 hours. No results for input(s): AMMONIA in the last 168 hours. Coagulation Profile: No results for input(s): INR, PROTIME in the last 168 hours. Cardiac Enzymes: Recent Labs  Lab 03/15/18 1652 03/18/18 1115 03/18/18 1631 03/18/18 2318  TROPONINI 0.44* 0.29* 0.26* 0.26*   BNP (last 3 results) Recent Labs    02/20/18 1236 03/01/18 1218  PROBNP 1,057.0* 1,336.0*   HbA1C: Recent Labs    03/17/18 1005  HGBA1C 9.3*   CBG: Recent Labs  Lab 03/18/18 0636 03/18/18 1120 03/18/18 1634 03/18/18 2121 03/19/18 0637  GLUCAP 162* 238* 254* 334* 269*   Lipid Profile: Recent Labs    03/19/18 0433  CHOL 209*  HDL 46  LDLCALC 138*  TRIG 127  CHOLHDL 4.5   Thyroid Function Tests: No results for input(s): TSH, T4TOTAL, FREET4, T3FREE, THYROIDAB in the last 72 hours. Anemia Panel: No results for input(s): VITAMINB12, FOLATE, FERRITIN, TIBC, IRON, RETICCTPCT in the last 72 hours. Sepsis Labs: Recent Labs  Lab 03/15/18 2008 03/16/18 0412 03/17/18 0847  PROCALCITON <0.10 <0.10 <0.10    Recent Results (from the past 240 hour(s))   Culture, blood (routine x 2) Call MD if unable to obtain prior to antibiotics being given     Status: None (Preliminary result)   Collection Time: 03/15/18  8:09 PM  Result Value Ref Range Status   Specimen Description BLOOD RIGHT HAND  Final   Special Requests   Final    BOTTLES DRAWN AEROBIC ONLY Blood Culture results may not be optimal due to an inadequate volume of blood received in culture bottles   Culture   Final    NO GROWTH 3 DAYS Performed at [facility], [place] [address] [place], [place] [address]    Report Status PENDING  Incomplete  Culture, blood (routine x 2) Call MD if unable to obtain prior to antibiotics being given     Status: None (Preliminary result)   Collection Time: 03/15/18  9:11 PM  Result Value Ref Range Status   Specimen Description BLOOD LEFT ANTECUBITAL  Final   Special Requests   Final    BOTTLES DRAWN AEROBIC ONLY Blood Culture results may not be optimal due to an inadequate volume of blood received in culture bottles   Culture   Final    NO GROWTH 3 DAYS Performed at [facility], [place] [address] [place],  [address]    Report Status PENDING  Incomplete  Respiratory Panel by PCR     Status: None   Collection Time: 03/17/18  8:41 AM  Result Value Ref Range Status   Adenovirus NOT DETECTED NOT DETECTED Final   Coronavirus 229E NOT DETECTED NOT DETECTED Final    Comment: (NOTE) The Coronavirus on the Respiratory Panel, DOES NOT test for the novel  Coronavirus (2019 nCoV)    Coronavirus HKU1 NOT DETECTED NOT DETECTED  Final   Coronavirus NL63 NOT DETECTED NOT DETECTED Final   Coronavirus OC43 NOT DETECTED NOT DETECTED Final   Metapneumovirus NOT DETECTED NOT DETECTED Final   Rhinovirus / Enterovirus NOT DETECTED NOT DETECTED Final   Influenza A NOT DETECTED NOT DETECTED Final   Influenza B NOT DETECTED NOT DETECTED Final   Parainfluenza Virus 1 NOT DETECTED NOT DETECTED Final   Parainfluenza Virus 2 NOT DETECTED NOT DETECTED Final    Parainfluenza Virus 3 NOT DETECTED NOT DETECTED Final   Parainfluenza Virus 4 NOT DETECTED NOT DETECTED Final   Respiratory Syncytial Virus NOT DETECTED NOT DETECTED Final   Bordetella pertussis NOT DETECTED NOT DETECTED Final   Chlamydophila pneumoniae NOT DETECTED NOT DETECTED Final   Mycoplasma pneumoniae NOT DETECTED NOT DETECTED Final    Comment: Performed at [facility], [place] [address], [place], [place] [address]      Radiology Studies: No results found.    LOS: 4 days   Time spent: More than 50% of that time was spent in counseling and/or coordination of care.  Antonieta Pert, MD [facility]  03/19/2018, 10:36 AM

## 2018-03-19 NOTE — Interval H&P Note (Signed)
History and Physical Interval Note:  03/19/2018 8:04 AM  Denise [name]  has presented today for surgery, with the diagnosis of heart failure.  The various methods of treatment have been discussed with the patient and family. After consideration of risks, benefits and other options for treatment, the patient has consented to  Procedure(s): RIGHT/LEFT HEART CATH AND CORONARY ANGIOGRAPHY (N/A) as a surgical intervention.  The patient's history has been reviewed, patient examined, no change in status, stable for surgery.  I have reviewed the patient's chart and labs.  Questions were answered to the patient's satisfaction.     Deania Siguenza [employer]

## 2018-03-19 NOTE — Care Management Important Message (Signed)
Important Message  Patient Details  Name: Denise [name]. [place] MRN: [id number] Date of Birth: 08/08/1945   Medicare Important Message Given:  Yes    Barb Merino Nakeem Murnane 03/19/2018, 1:58 PM

## 2018-03-20 ENCOUNTER — Inpatient Hospital Stay (HOSPITAL_COMMUNITY): Payer: Medicare HMO

## 2018-03-20 ENCOUNTER — Encounter (HOSPITAL_COMMUNITY): Payer: Self-pay | Admitting: Cardiology

## 2018-03-20 DIAGNOSIS — N179 Acute kidney failure, unspecified: Secondary | ICD-10-CM

## 2018-03-20 DIAGNOSIS — I5043 Acute on chronic combined systolic (congestive) and diastolic (congestive) heart failure: Secondary | ICD-10-CM

## 2018-03-20 DIAGNOSIS — I272 Pulmonary hypertension, unspecified: Secondary | ICD-10-CM

## 2018-03-20 LAB — GLUCOSE, CAPILLARY
GLUCOSE-CAPILLARY: 190 mg/dL — AB (ref 70–99)
GLUCOSE-CAPILLARY: 427 mg/dL — AB (ref 70–99)
Glucose-Capillary: 163 mg/dL — ABNORMAL HIGH (ref 70–99)
Glucose-Capillary: 246 mg/dL — ABNORMAL HIGH (ref 70–99)

## 2018-03-20 LAB — BASIC METABOLIC PANEL
Anion gap: 8 (ref 5–15)
BUN: 23 mg/dL (ref 8–23)
CALCIUM: 10.1 mg/dL (ref 8.9–10.3)
CO2: 30 mmol/L (ref 22–32)
Chloride: 101 mmol/L (ref 98–111)
Creatinine, Ser: 1.02 mg/dL — ABNORMAL HIGH (ref 0.44–1.00)
GFR calc Af Amer: >60 mL/min (ref >60)
GFR calc non Af Amer: 55 mL/min — ABNORMAL LOW (ref >60)
Glucose, Bld: 188 mg/dL — ABNORMAL HIGH (ref 70–99)
Potassium: 3.8 mmol/L (ref 3.5–5.1)
Sodium: 139 mmol/L (ref 135–145)

## 2018-03-20 LAB — CBC
HCT: 48.7 % — ABNORMAL HIGH (ref 36.0–46.0)
Hemoglobin: 15.3 g/dL — ABNORMAL HIGH (ref 12.0–15.0)
MCH: 26.5 pg (ref 26.0–34.0)
MCHC: 31.4 g/dL (ref 30.0–36.0)
MCV: 84.4 fL (ref 80.0–100.0)
Platelets: 209 10*3/uL (ref 150–400)
RBC: 5.77 MIL/uL — AB (ref 3.87–5.11)
RDW: 14.4 % (ref 11.5–15.5)
WBC: 5.6 10*3/uL (ref 4.0–10.5)
nRBC: 0 % (ref 0.0–0.2)

## 2018-03-20 LAB — CENTROMERE ANTIBODIES: Centromere Ab Screen: <0.2 AI (ref 0.0–0.9)

## 2018-03-20 LAB — CULTURE, BLOOD (ROUTINE X 2)
Culture: NO GROWTH
Culture: NO GROWTH

## 2018-03-20 LAB — HIV ANTIBODY (ROUTINE TESTING W REFLEX): HIV Screen 4th Generation wRfx: NONREACTIVE

## 2018-03-20 LAB — ANTI-SCLERODERMA ANTIBODY: Scleroderma (Scl-70) (ENA) Antibody, IgG: <0.2 AI (ref 0.0–0.9)

## 2018-03-20 LAB — ANA: Anti Nuclear Antibody(ANA): NEGATIVE

## 2018-03-20 LAB — RHEUMATOID FACTOR: RHEUMATOID FACTOR: 49.5 [IU]/mL — AB (ref 0.0–13.9)

## 2018-03-20 LAB — ANTI-JO 1 ANTIBODY, IGG: Anti JO-1: <0.2 AI (ref 0.0–0.9)

## 2018-03-20 MED ORDER — SPIRONOLACTONE 12.5 MG HALF TABLET
12.5000 mg | ORAL_TABLET | Freq: Every day | ORAL | Status: DC
  Administered 2018-03-20 – 2018-03-21 (×2): 12.5 mg via ORAL
  Filled 2018-03-20 (×2): qty 1

## 2018-03-20 MED ORDER — ISOSORB DINITRATE-HYDRALAZINE 20-37.5 MG PO TABS
1.0000 | ORAL_TABLET | Freq: Three times a day (TID) | ORAL | Status: DC
  Administered 2018-03-20 – 2018-03-21 (×3): 1 via ORAL
  Filled 2018-03-20 (×3): qty 1

## 2018-03-20 MED ORDER — TECHNETIUM TC 99M PYROPHOSPHATE
20.8000 | Freq: Once | INTRAVENOUS | Status: AC | PRN
  Administered 2018-03-20: 20.8 via INTRAVENOUS

## 2018-03-20 NOTE — Progress Notes (Addendum)
Advanced Heart Failure Rounding Note  PCP-Cardiologist: No primary care provider on file.   Subjective:    Went for cath yesterday and high rest chest CT yesterday. See below. Creatinine stable. SBP elevated 120-160s.  She feels fine. Still SOB on exertion. No CP. Discussed results of procedures with patient and her sister.   High Res Chest CT 03/19/18: Moderate, predominantly centrilobular emphysema. There is dependent bibasilar scarring or atelectasis. No evidence of significant fibrotic interstitial lung disease.  Cardiac MRI 03/18/18:  1. Normal LV size with moderate focal basal septal hypertrophy. EF 38%, diffuse hypokinesis. 2. Normal RV size with moderately decreased systolic function, EF 41%. 3. Delayed enhancement imaging showed mid-wall LGE in the basal anteroseptal wall and to a lesser extent in the mid lateral wall. Given the finding of prominent LGE in the area of focal basal septal hypertrophy, this may be suggestive of hypertrophic cardiomyopathy. Cannot rule out cardiac amyloidosis but think this is less likely.  LHC/RHC:  Diagnostic  Dominance: Right  Left Main  No significant disease.  Left Anterior Descending  Luminal irregularities.  Left Circumflex  Luminal irregularities.  Right Coronary Artery  Luminal irregularities.  Intervention   No interventions have been documented.  Right Heart   Right Heart Pressures RHC Procedural Findings: Hemodynamics (mmHg) RA mean 7 RV 65/9 PA 59/22, mean 36 PCWP mean 9 LV 179/12 AO 176/95  Oxygen saturations: PA 66% AO 99%  Cardiac Output (Fick) 3.15  Cardiac Index (Fick) 2  PVR 8.6 WU    Objective:   Weight Range: 51.5 kg Body mass index is 17.78 kg/m.   Vital Signs:   Temp:  [97.3 F (36.3 C)-98 F (36.7 C)] 97.7 F (36.5 C) (03/18 0552) Pulse Rate:  [52-119] 84 (03/18 0716) Resp:  [18] 18 (03/18 0552) BP: (125-151)/(73-110) 144/108 (03/18 0654) SpO2:  [94 %-100 %] 94 % (03/18 0830)  Weight:  [51.5 kg] 51.5 kg (03/18 0214) Last BM Date: 03/17/18  Weight change: Filed Weights   03/18/18 0639 03/19/18 0258 03/20/18 0214  Weight: 50.4 kg 51.3 kg 51.5 kg    Intake/Output:   Intake/Output Summary (Last 24 hours) at 03/20/2018 0836 Last data filed at 03/20/2018 0216 Gross per 24 hour  Intake 704.24 ml  Output 1200 ml  Net -495.76 ml      Physical Exam    General: Well appearing. No resp difficulty. HEENT: Normal Neck: Supple. JVP 5-6. Carotids 2+ bilat; no bruits. No thyromegaly or nodule noted. Cor: PMI nondisplaced. RRR, No M/G/R noted Lungs: CTAB, normal effort. Abdomen: Soft, non-tender, non-distended, no HSM. No bruits or masses. +BS  Extremities: No cyanosis, clubbing, or rash. R and LLE 1+ ankle edema Neuro: Alert & orientedx3, cranial nerves grossly intact. moves all 4 extremities w/o difficulty. Affect pleasant  Telemetry   NSR with frequent PACs vs WAP. Personally reviewed.   Labs    CBC Recent Labs    03/19/18 1030 03/20/18 0348  WBC 8.2 5.6  HGB 16.2* 15.3*  HCT 50.6* 48.7*  MCV 85.0 84.4  PLT 227 287   Basic Metabolic Panel Recent Labs    03/18/18 1631 03/19/18 0433 03/19/18 0819 03/19/18 1030 03/20/18 0348  NA  --  142 141  141  --  139  K  --  3.8 3.2*  3.2*  --  3.8  CL  --  98  --   --  101  CO2  --  32  --   --  30  GLUCOSE  --  262*  --   --  188*  BUN  --  20  --   --  23  CREATININE  --  1.30*  --  1.16* 1.02*  CALCIUM  --  10.7*  --   --  10.1  MG 3.5*  --   --  1.8  --    Liver Function Tests No results for input(s): AST, ALT, ALKPHOS, BILITOT, PROT, ALBUMIN in the last 72 hours. No results for input(s): LIPASE, AMYLASE in the last 72 hours. Cardiac Enzymes Recent Labs    03/18/18 1115 03/18/18 1631 03/18/18 2318  TROPONINI 0.29* 0.26* 0.26*    BNP: BNP (last 3 results) Recent Labs    09/16/17 2005 03/15/18 2008  BNP 310.4* 1,914.0*    ProBNP (last 3 results) Recent Labs    02/20/18 1236  03/01/18 1218  PROBNP 1,057.0* 1,336.0*     D-Dimer No results for input(s): DDIMER in the last 72 hours. Hemoglobin A1C Recent Labs    03/17/18 1005  HGBA1C 9.3*   Fasting Lipid Panel Recent Labs    03/19/18 0433  CHOL 209*  HDL 46  LDLCALC 138*  TRIG 127  CHOLHDL 4.5   Thyroid Function Tests No results for input(s): TSH, T4TOTAL, T3FREE, THYROIDAB in the last 72 hours.  Invalid input(s): FREET3  Other results:   Imaging    Ct Chest High Resolution  Result Date: 03/19/2018 CLINICAL DATA:  Interstitial lung disease, emphysema EXAM: CT CHEST WITHOUT CONTRAST TECHNIQUE: Multidetector CT imaging of the chest was performed following the standard protocol without intravenous contrast. High resolution imaging of the lungs, as well as inspiratory and expiratory imaging, was performed. COMPARISON:  None. FINDINGS: Cardiovascular: Coronary artery calcifications. Calcific atherosclerosis of the thoracic aorta. Normal heart size. No pericardial effusion. Mediastinum/Nodes: No enlarged mediastinal, hilar, or axillary lymph nodes. Enlarged thyroid. Trachea, and esophagus demonstrate no significant findings. Lungs/Pleura: Moderate, predominantly centrilobular emphysema. There is dependent bibasilar scarring or atelectasis. No evidence of significant fibrotic interstitial lung disease. No evidence of air trapping. Small left pleural effusion. Upper Abdomen: No acute abnormality. Musculoskeletal: No chest wall mass or suspicious bone lesions identified. IMPRESSION: 1. Moderate, predominantly centrilobular emphysema. There is dependent bibasilar scarring or atelectasis. No evidence of significant fibrotic interstitial lung disease. 2.  Small left pleural effusion. 3.  Coronary artery disease. 4.  Thyroid goiter. Electronically Signed   By: Eddie Candle M.D.   On: 03/19/2018 16:53     Medications:     Scheduled Medications: . alum & mag hydroxide-simeth  15 mL Oral Once  . amLODipine  5  mg Oral Daily  . aspirin EC  81 mg Oral Daily  . azithromycin  500 mg Oral Q24H  . carvedilol  6.25 mg Oral BID WC  . cefdinir  300 mg Oral Q12H  . cinacalcet  30 mg Oral Q M,W,F  . famotidine  40 mg Oral BID  . feeding supplement (ENSURE ENLIVE)  237 mL Oral BID BM  . fluticasone furoate-vilanterol  1 puff Inhalation Daily  . furosemide  20 mg Oral Daily  . glipiZIDE  2.5 mg Oral QAC breakfast  . heparin  5,000 Units Subcutaneous Q8H  . insulin aspart  0-5 Units Subcutaneous QHS  . insulin aspart  0-9 Units Subcutaneous TID WC  . ipratropium-albuterol  3 mL Nebulization TID  . losartan  100 mg Oral Daily  . mirtazapine  15 mg Oral QHS  . multivitamin with minerals  1 tablet Oral Daily  . pantoprazole  40 mg Oral Daily  . predniSONE  20 mg Oral Q breakfast  . sodium chloride flush  3 mL Intravenous Q12H  . sodium chloride flush  3 mL Intravenous Q12H    Infusions: . sodium chloride 10 mL/hr at 03/16/18 2322  . sodium chloride 250 mL (03/19/18 1322)    PRN Medications: sodium chloride, sodium chloride, acetaminophen, calcium carbonate, Muscle Rub, ondansetron (ZOFRAN) IV, sodium chloride flush, sodium chloride flush     Assessment/Plan   1. PNA in setting of COPD: Afebrile, WBCs not elevated. She is being treated with prednisone 20 mg daily and cefdinir/azithromycin. PCT not elevated.  No PNA seen on CT chest.   - Think she can probably stop abx, will leave up to primary service.   2. Acute on chronic primarily diastolic CHF: With prominent RV failure.  Echo this admission with EF down to 45-50%, LVH, moderate RV systolic dysfunction.  She has diuresed this admission with improving dyspnea.  There is concern based on appearance of echo for infiltrative disease.  On RHC/LHC 3/17, filling pressures are near normal now but there is moderate pulmonary arterial hypertension.  - Myeloma panel pending.  - Volume stable. Start lasix 20 mg daily today - Cardiac MRI 3/16: EF 38%,  diffuse HK, RV moderately decreased function, delayed enhancement suggestive of hypertrophic CM. Cardiac amyloid is not ruled out, but less likely.  3. CAD: Mild troponin elevation this admission at 0.44.  She has had atypical chest pain and has some new T wave changes anteriorly.  Echo down to 45-50% from 60-65% prior.  Coronary angiography was done today, only luminal irregularities.  No change.  4. Pulmonary hypertension: RHC in 2019, thought to have group 3 PH due to lung disease/COPD. However, it has been been many years since she smoked and despite abnormal PFTs, we do not have recent lung imaging.  V/Q scan in 9/19 showed no chronic PE. Based on today's RHC, it would be reasonable to revisit whether we attempt treatment with pulmonary vasodilators for possible group 1 PH component.  - High resolution CT chest: Moderate, predominantly centrilobular emphysema. There is dependent bibasilar scarring or atelectasis. No evidence of significant fibrotic interstitial lung disease. Consider trial of pulmonary vasodilators. Will discuss with Dr Aundra Dubin.  - ANA, anti-centromere, and anti-SCL70 pending. RF and HIV negative.   Length of Stay: [place], NP  03/20/2018, 8:36 AM  Advanced Heart Failure Team Pager [phone] (M-F; 7a - 4p)  Please contact [facility] for night-coverage after hours (4p -7a ) and weekends on [email or web address]  Patient seen with NP, agree with the above note.    Says breathing is better today.   On exam, JVP 8 cm.  Regular S1S2. Distant BS.  Trace ankle edema.   1. Concern for PNA in setting of severe COPD: Afebrile, WBCs not elevated. She is being treated with prednisone 20 mg daily and cefdinir/azithromycin. PCT not elevated and CT chest did not show PNA. - She probably can stop abx, per primary service.  2. Acute on chronic primarily diastolic CHF: With prominent RV failure.  Echo this admission with EF down to 45-50%, LVH, moderate RV systolic dysfunction.  She  has diuresed this admission with improving dyspnea. There is concern based on appearance of echo for infiltrative disease. RHC/LHC was done, showing no significant CAD and moderate pulmonary arterial hypertension.  Cardiac MRI showed LV EF 38%, moderate basal septal hypertrophy, RV EF 30%, mid-wall LGE in the basal anteroseptal wall and  lesser extent in mid lateral wall.  Most concerning for hypertrophic CMP, less likely cardiac amyloidosis.  She does not have a known family history of HCM. Volume status looks ok on exam and filling pressures normal on RHC yesterday.  - Lasix 20 mg daily.  - Will send myeloma panel and get PYP scan to rule out amyloidosis (as above, think cMRI looks more like HCM).    - With moderately depressed LV EF, will continue Coreg and losartan (transition eventually to [facility]).  With high BP, will add spironolactone 12.5 and stop amlodipine/start Bidil 1 tab tid.  3. CAD: Mild on cath.   - Continue ASA.  4. Pulmonary hypertension: RHC showed moderate pulmonary arterial hypertension with high PVR (8 WU).  V/Q scan (9/19) did not show chronic PE. CT chest high resolution showed no ILD, there was moderate emphysema.  Pulmonary hypertension seems to be out of proportion to degree of lung disease.  Suspect mixed group 1 and group 3 PH.  Serologies sent, RF elevated. HIV negative.  - Send CCP antibody.  - I think reasonable to give her a trial of selective pulmonary vasodilators for component of group 1 PH.  Would start with Tyvaso to try to avoid V/Q mismatching given moderate emphysema.  Will need to do this as outpatient.   Loralie Champagne 03/20/2018 11:11 AM

## 2018-03-20 NOTE — Progress Notes (Signed)
PROGRESS NOTE    Denise [name]  JJH:[id number] DOB: 1945-08-30 DOA: 03/15/2018 PCP: [name], NP   Brief Narrative:  HPI on 03/15/2018 by Dr. Karie Kirks The patient is a 73 yr old woman who carries a past medical history significant for COPD with chronic respiratory requiring 2L O2, DM II, HTN, hyperlipidemia, colon cancer, SBO, pulmonary hypertension. She states that the shortness of breath actually started about a month ago, but has gotten much worse in the last week. She states that she also has had chest pain and tightness at times. She cannot say if this chest pain was associated with exertion or not. The patient ran out of her inhalers a week ago. She has been taking prednisone daily. She has developed severe edema in her lower extremities bilaterally. She denies fever or chills. She states that she has had a cough and sneeze. There has been no nausea/vomiting, diarrhea or constipation. Today family noted that the patient was confused and brought her to the ED.  Interim history Admitted for CHF exacerbation. CHF team consulted. S/p cath, cMRI, CT scan.   Assessment & [name]   Acute on chronic diastolic heart failure exacerbation with pulmonary hypertension -Presented with significant leg edema on admission, BNP of 1914 -Patient reports that her weight has been of approximately 130 pounds in the past several months -Echocardiogram shows an EF of 45 to 50%, asymmetric septal hypertrophy myocardial appearing suggestive of infiltrative process, diffuse hypokinesia with restrictive diastolic dysfunction -Cardiology consulted and appreciated -Status post heart catheterization showing moderate pulmonary arterial hypertension with PVR 8.5WU -was placed on IV Lasix and transitioned to oral lasix, spironolactone added -Continue losartan, Coreg -Cardiology also ordered myeloma panel and PYP to rule out amyloidosis -Patient may eventually need Entresto  Acute on chronic hypoxic  respiratory failure -Likely secondary to multifactorial causes including CHF exacerbation, COPD, pneumonia -Patient currently on 2 L of oxygen -Continue diuresis, bronchodilators, steroid taper -Patient follows with pulmonology, Dr. worked as an outpatient  Left lingular pneumonia/community-acquired pneumonia -Blood cultures unremarkable -Influenza PCR and respiratory viral panel negative -Patient currently on Omnicef and azithromycin  -Continue bronchodilators and supplemental oxygen  Acute COPD exacerbation -Continue prednisone taper, duo nebs, supplemental oxygen, bronchodilators, Breo  Pulmonary hypertension -HRCT ordered to look for interstitial lung disease along with serology/autoimmune work-up per CHF team (so far only RF +)  -CT showed moderate, predominantly centrilobular emphysema.  No evidence of significant fibrotic interstitial lung disease. -Cardiology may start Tyvaso as an outpatient  Elevated troponin -Peaked at 0.44 -In the setting of CHF, hypoxic respiratory failure, COPD exacerbation -Treatment and work-up as above  Hypokalemia/hypomagnesemia -Replaced, continue to monitor and replace as needed   Mild acute kidney injury -Improving, creatinine 1.02 today  Essential hypertension -BP improving -Continue Coreg, losartan, Lasix, Spironolactone  Diabetes mellitus, type II -Hemoglobin A1c 9 -Continue insulin sliding scale with CBG monitoring, glipizide restarted  DVT Prophylaxis  Heparin  Code Status: Full  Family Communication: family at bedside  Disposition [name]: Admitted. Pending further cardiology recommendations  Consultants Cardiology  Procedures  Echoardiogram Left/Rigth heart cath  Antibiotics   Anti-infectives (From admission, onward)   Start     Dose/Rate Route Frequency Ordered Stop   03/18/18 1800  cefdinir (OMNICEF) capsule 300 mg     300 mg Oral Every 12 hours 03/18/18 0913 03/23/18 2159   03/17/18 1900  azithromycin (ZITHROMAX)  tablet 500 mg     500 mg Oral Every 24 hours 03/17/18 0811 03/22/18 1859   03/16/18  1700  cefTRIAXone (ROCEPHIN) 1 g in sodium chloride 0.9 % 100 mL IVPB  Status:  Discontinued     1 g 200 mL/hr over 30 Minutes Intravenous Every 24 hours 03/15/18 1850 03/18/18 0913   03/15/18 1900  cefTRIAXone (ROCEPHIN) 1 g in sodium chloride 0.9 % 100 mL IVPB  Status:  Discontinued     1 g 200 mL/hr over 30 Minutes Intravenous Every 24 hours 03/15/18 1850 03/15/18 1850   03/15/18 1900  azithromycin (ZITHROMAX) 500 mg in sodium chloride 0.9 % 250 mL IVPB  Status:  Discontinued     500 mg 250 mL/hr over 60 Minutes Intravenous Every 24 hours 03/15/18 1850 03/17/18 0811   03/15/18 1815  cefTRIAXone (ROCEPHIN) 1 g in sodium chloride 0.9 % 100 mL IVPB     1 g 200 mL/hr over 30 Minutes Intravenous  Once 03/15/18 1801 03/15/18 1851   03/15/18 1815  doxycycline (VIBRA-TABS) tablet 100 mg     100 mg Oral  Once 03/15/18 1801 03/15/18 1820      Subjective:   Denise [name] seen and examined today.  Patient continues to have shortness of breath but feels it is mildly improved as compared to previous days.  Does not feel that she is back to her normal.  She denies current chest pain, abdominal pain, nausea or vomiting, diarrhea or constipation, dizziness or headache.    Objective:   Vitals:   03/20/18 0654 03/20/18 0716 03/20/18 0830 03/20/18 1238  BP: (!) 144/108   (!) 137/99  Pulse:  84  66  Resp:    16  Temp:    97.7 F (36.5 C)  TempSrc:    Oral  SpO2:   94% 97%  Weight:      Height:        Intake/Output Summary (Last 24 hours) at 03/20/2018 1359 Last data filed at 03/20/2018 1240 Gross per 24 hour  Intake 584.24 ml  Output 1500 ml  Net -915.76 ml   Filed Weights   03/18/18 0639 03/19/18 0258 03/20/18 0214  Weight: 50.4 kg 51.3 kg 51.5 kg    Exam  General: Well developed, chronically ill-appearing, NAD  HEENT: NCAT, mucous membranes moist.   Neck: Supple  Cardiovascular: S1 S2  auscultated, RRR, no murmur  Respiratory: Clear to auscultation bilaterally   Abdomen: Soft, nontender, nondistended, + bowel sounds  Extremities: warm dry without cyanosis clubbing. LLE > RLE   Neuro: AAOx3, nonfocal  Psych: pleasant, appropriate mood and affet   Data Reviewed: I have personally reviewed following labs and imaging studies  CBC: Recent Labs  Lab 03/15/18 2008 03/18/18 0628 03/19/18 0433 03/19/18 0819 03/19/18 1030 03/20/18 0348  WBC 8.1 6.2 6.8  --  8.2 5.6  HGB 14.5 13.4 14.9 13.9  13.9 16.2* 15.3*  HCT 47.6* 43.3 45.6 41.0  41.0 50.6* 48.7*  MCV 88.1 86.3 84.9  --  85.0 84.4  PLT 181 169 161  --  227 086   Basic Metabolic Panel: Recent Labs  Lab 03/16/18 0412 03/16/18 0918 03/17/18 0847 03/18/18 0628 03/18/18 1631 03/19/18 0433 03/19/18 0819 03/19/18 1030 03/20/18 0348  NA 142  --  139 142  --  142 141  141  --  139  K 2.8*  --  5.0 4.0  --  3.8 3.2*  3.2*  --  3.8  CL 100  --  103 105  --  98  --   --  101  CO2 32  --  33* 30  --  32  --   --  30  GLUCOSE 256*  --  406* 176*  --  262*  --   --  188*  BUN 12  --  19 18  --  20  --   --  23  CREATININE 0.82  --  1.22* 0.96  --  1.30*  --  1.16* 1.02*  CALCIUM 10.1  --  10.7* 10.9*  --  10.7*  --   --  10.1  MG  --  1.1* 1.1*  --  3.5*  --   --  1.8  --    GFR: Estimated Creatinine Clearance: 40.5 mL/min (A) (by C-G formula based on SCr of 1.02 mg/dL (H)). Liver Function Tests: Recent Labs  Lab 03/15/18 1652  AST 35  ALT 24  ALKPHOS 106  BILITOT 1.4*  PROT 5.5*  ALBUMIN 3.3*   No results for input(s): LIPASE, AMYLASE in the last 168 hours. No results for input(s): AMMONIA in the last 168 hours. Coagulation Profile: No results for input(s): INR, PROTIME in the last 168 hours. Cardiac Enzymes: Recent Labs  Lab 03/15/18 1652 03/18/18 1115 03/18/18 1631 03/18/18 2318  TROPONINI 0.44* 0.29* 0.26* 0.26*   BNP (last 3 results) Recent Labs    02/20/18 1236 03/01/18 1218   PROBNP 1,057.0* 1,336.0*   HbA1C: No results for input(s): HGBA1C in the last 72 hours. CBG: Recent Labs  Lab 03/19/18 1158 03/19/18 1658 03/19/18 2108 03/20/18 0629 03/20/18 1234  GLUCAP 120* 222* 196* 190* 163*   Lipid Profile: Recent Labs    03/19/18 0433  CHOL 209*  HDL 46  LDLCALC 138*  TRIG 127  CHOLHDL 4.5   Thyroid Function Tests: No results for input(s): TSH, T4TOTAL, FREET4, T3FREE, THYROIDAB in the last 72 hours. Anemia Panel: No results for input(s): VITAMINB12, FOLATE, FERRITIN, TIBC, IRON, RETICCTPCT in the last 72 hours. Urine analysis:    Component Value Date/Time   COLORURINE YELLOW 09/16/2017 2002   APPEARANCEUR CLEAR 09/16/2017 2002   LABSPEC 1.010 09/16/2017 2002   PHURINE 6.0 09/16/2017 2002   GLUCOSEU 50 (A) 09/16/2017 2002   HGBUR SMALL (A) 09/16/2017 2002   BILIRUBINUR Negative 09/28/2017 1604   KETONESUR 5 (A) 09/16/2017 2002   PROTEINUR Negative 09/28/2017 1604   PROTEINUR NEGATIVE 09/16/2017 2002   UROBILINOGEN 0.2 09/28/2017 1604   NITRITE Negative 09/28/2017 1604   NITRITE NEGATIVE 09/16/2017 2002   LEUKOCYTESUR Negative 09/28/2017 1604   Sepsis Labs: _0 (procalcitonin:4,lacticidven:4)  ) Recent Results (from the past 240 hour(s))  Culture, blood (routine x 2) Call MD if unable to obtain prior to antibiotics being given     Status: None   Collection Time: 03/15/18  8:09 PM  Result Value Ref Range Status   Specimen Description BLOOD RIGHT HAND  Final   Special Requests   Final    BOTTLES DRAWN AEROBIC ONLY Blood Culture results may not be optimal due to an inadequate volume of blood received in culture bottles   Culture   Final    NO GROWTH 5 DAYS Performed at [facility], [place] [address] [place], [place] [address]    Report Status 03/20/2018 FINAL  Final  Culture, blood (routine x 2) Call MD if unable to obtain prior to antibiotics being given     Status: None   Collection Time: 03/15/18  9:11 PM  Result Value  Ref Range Status   Specimen Description BLOOD LEFT ANTECUBITAL  Final   Special Requests   Final  BOTTLES DRAWN AEROBIC ONLY Blood Culture results may not be optimal due to an inadequate volume of blood received in culture bottles   Culture   Final    NO GROWTH 5 DAYS Performed at [facility], [place] [address] [place], [place] [address]    Report Status 03/20/2018 FINAL  Final  Respiratory Panel by PCR     Status: None   Collection Time: 03/17/18  8:41 AM  Result Value Ref Range Status   Adenovirus NOT DETECTED NOT DETECTED Final   Coronavirus 229E NOT DETECTED NOT DETECTED Final    Comment: (NOTE) The Coronavirus on the Respiratory Panel, DOES NOT test for the novel  Coronavirus (2019 nCoV)    Coronavirus HKU1 NOT DETECTED NOT DETECTED Final   Coronavirus NL63 NOT DETECTED NOT DETECTED Final   Coronavirus OC43 NOT DETECTED NOT DETECTED Final   Metapneumovirus NOT DETECTED NOT DETECTED Final   Rhinovirus / Enterovirus NOT DETECTED NOT DETECTED Final   Influenza A NOT DETECTED NOT DETECTED Final   Influenza B NOT DETECTED NOT DETECTED Final   Parainfluenza Virus 1 NOT DETECTED NOT DETECTED Final   Parainfluenza Virus 2 NOT DETECTED NOT DETECTED Final   Parainfluenza Virus 3 NOT DETECTED NOT DETECTED Final   Parainfluenza Virus 4 NOT DETECTED NOT DETECTED Final   Respiratory Syncytial Virus NOT DETECTED NOT DETECTED Final   Bordetella pertussis NOT DETECTED NOT DETECTED Final   Chlamydophila pneumoniae NOT DETECTED NOT DETECTED Final   Mycoplasma pneumoniae NOT DETECTED NOT DETECTED Final    Comment: Performed at [facility], [place] [address] [place], [place] [address]      Radiology Studies: Ct Chest High Resolution  Result Date: 03/19/2018 CLINICAL DATA:  Interstitial lung disease, emphysema EXAM: CT CHEST WITHOUT CONTRAST TECHNIQUE: Multidetector CT imaging of the chest was performed following the standard protocol without intravenous contrast. High resolution  imaging of the lungs, as well as inspiratory and expiratory imaging, was performed. COMPARISON:  None. FINDINGS: Cardiovascular: Coronary artery calcifications. Calcific atherosclerosis of the thoracic aorta. Normal heart size. No pericardial effusion. Mediastinum/Nodes: No enlarged mediastinal, hilar, or axillary lymph nodes. Enlarged thyroid. Trachea, and esophagus demonstrate no significant findings. Lungs/Pleura: Moderate, predominantly centrilobular emphysema. There is dependent bibasilar scarring or atelectasis. No evidence of significant fibrotic interstitial lung disease. No evidence of air trapping. Small left pleural effusion. Upper Abdomen: No acute abnormality. Musculoskeletal: No chest wall mass or suspicious bone lesions identified. IMPRESSION: 1. Moderate, predominantly centrilobular emphysema. There is dependent bibasilar scarring or atelectasis. No evidence of significant fibrotic interstitial lung disease. 2.  Small left pleural effusion. 3.  Coronary artery disease. 4.  Thyroid goiter. Electronically Signed   By: Eddie Candle M.D.   On: 03/19/2018 16:53   Mr Cardiac Morphology W Wo Contrast  Result Date: 03/19/2018 CLINICAL DATA:  ?Cardiac amyloidosis or hypertrophic cardiomyopathy EXAM: CARDIAC MRI TECHNIQUE: The patient was scanned on a 1.5 Tesla GE magnet. A dedicated cardiac coil was used. Functional imaging was done using Fiesta sequences. 2,3, and 4 chamber views were done to assess for RWMA's. Modified Simpson's rule using a short axis stack was used to calculate an ejection fraction on a dedicated work Conservation officer, nature. The patient received 8 cc of Gadavist. After 10 minutes inversion recovery sequences were used to assess for infiltration and scar tissue. FINDINGS: Limited images of the lung fields show no gross abnormalities. Small pericardial effusion. Normal left ventricular size with moderate focal basal septal hypertrophy. Moderate diffuse hypokinesis, EF  38%. Normal  right ventricular size with moderate diffuse hypokinesis, EF 30%. Mild left atrial enlargement. Normal right atrial size. Trileaflet aortic valve, no definite regurgitation or stenosis noted. No significant mitral regurgitation noted. No systolic anterior motion of the mitral valve noted. On delayed enhancement imaging, there was mid-wall late gadolinium enhancement (LGE) in the basal anteroseptal wall at the site of asymmetric LV hypertrophy. There was a small area of mid-wall LGE in the mid lateral wall. Measurements: LVEDV 114 mL LVSV 43 mL LVEF 38% RVEDV 153 mL RVSV 46 mL RVEF 30% IMPRESSION: 1. Normal LV size with moderate focal basal septal hypertrophy. EF 38%, diffuse hypokinesis. 2. Normal RV size with moderately decreased systolic function, EF 40%. 3. Delayed enhancement imaging showed mid-wall LGE in the basal anteroseptal wall and to a lesser extent in the mid lateral wall. Given the finding of prominent LGE in the area of focal basal septal hypertrophy, this may be suggestive of hypertrophic cardiomyopathy. Cannot rule out cardiac amyloidosis but think this is less likely. Dalton Mclean Electronically Signed   By: Loralie Champagne M.D.   On: 03/19/2018 16:27     Scheduled Meds: . alum & mag hydroxide-simeth  15 mL Oral Once  . aspirin EC  81 mg Oral Daily  . azithromycin  500 mg Oral Q24H  . carvedilol  6.25 mg Oral BID WC  . cefdinir  300 mg Oral Q12H  . cinacalcet  30 mg Oral Q M,W,F  . famotidine  40 mg Oral BID  . feeding supplement (ENSURE ENLIVE)  237 mL Oral BID BM  . fluticasone furoate-vilanterol  1 puff Inhalation Daily  . furosemide  20 mg Oral Daily  . glipiZIDE  2.5 mg Oral QAC breakfast  . heparin  5,000 Units Subcutaneous Q8H  . insulin aspart  0-5 Units Subcutaneous QHS  . insulin aspart  0-9 Units Subcutaneous TID WC  . ipratropium-albuterol  3 mL Nebulization TID  . isosorbide-hydrALAZINE  1 tablet Oral TID  . losartan  100 mg Oral Daily  . mirtazapine  15 mg Oral  QHS  . multivitamin with minerals  1 tablet Oral Daily  . pantoprazole  40 mg Oral Daily  . predniSONE  20 mg Oral Q breakfast  . sodium chloride flush  3 mL Intravenous Q12H  . sodium chloride flush  3 mL Intravenous Q12H  . spironolactone  12.5 mg Oral Daily   Continuous Infusions: . sodium chloride 10 mL/hr at 03/16/18 2322  . sodium chloride 250 mL (03/19/18 1322)     LOS: 5 days   Time Spent in minutes   30 minutes  Marajade Lei D.O. on 03/20/2018 at 1:59 PM  Between 7am to 7pm - Please see pager noted on [email or web address]  After 7pm go to [email or web address]  And look for the night coverage person covering for me after hours  [facility] Office  [phone]

## 2018-03-20 NOTE — Progress Notes (Signed)
RN paged Dr. [place] Lions via Shea Evans text page and messaged MD about 5 beat run of Vtach.

## 2018-03-20 NOTE — Progress Notes (Signed)
Inpatient Diabetes Program Recommendations  AACE/ADA: New Consensus Statement on Inpatient Glycemic Control (2015)  Target Ranges:  Prepandial:   less than 140 mg/dL      Peak postprandial:   less than 180 mg/dL (1-2 hours)      Critically ill patients:  140 - 180 mg/dL   Lab Results  Component Value Date   GLUCAP 190 (H) 03/20/2018   HGBA1C 9.3 (H) 03/17/2018     Results for BOBBYJO, MARULANDA (MRN [id number]) as of 03/20/2018 10:59  Ref. Range 03/19/2018 06:37 03/19/2018 11:58 03/19/2018 16:58 03/19/2018 21:08 03/20/2018 06:29  Glucose-Capillary Latest Ref Range: 70 - 99 mg/dL 269 (H)  Novolog 5 units 120 (H) 222 (H)  Novolog 3 units 196 (H) 190 (H)  Novolog 2 units     Review of Glycemic Control  Diabetes history: DM2 Outpatient Diabetes medications: None Current orders for Inpatient glycemic control: Glucotrol 2.5 mg daily                                                                           Novolog sensitive (0-9 units) tid and (0-5)hs                                                                              Prednisone 20 mg daily starting today (decreased from 40 mg last dose 3/16 at 0810)    Inpatient Diabetes Program Recommendations:    MD please consider adding Novolog 3 units TID with meals (Please add the following hold parameters:  Hold if NPO or patient eats less than 50% of meal.)   -- Will follow during hospitalization.--  Jonna Clark RN, MSN Diabetes Coordinator Inpatient Glycemic Control Team Team Pager: [phone] (8am-5pm)

## 2018-03-20 NOTE — Plan of Care (Signed)
  Problem: Education: Goal: Ability to demonstrate management of disease process will improve Outcome: Progressing   Problem: Education: Goal: Ability to verbalize understanding of medication therapies will improve Outcome: Progressing   

## 2018-03-20 NOTE — Progress Notes (Signed)
Paged Dr. Hilbert Bible via Shea Evans and made her aware that patient's blood pressure is 144/108 and no antihypertensives due until 1000.  Dr. Hilbert Bible called back and gave order to give norvasc, coreg and cozaar now.

## 2018-03-20 NOTE — Progress Notes (Addendum)
Nutrition Follow-up  DOCUMENTATION CODES:   Severe malnutrition in context of chronic illness, Underweight  INTERVENTION:   -Continue Ensure Enlive po BID, each supplement provides 350 kcal and 20 grams of protein -Continue MVI with minerals daily -Continue Magic Cup TID with meals, each supplement provides 290 kcals and 9 grams protein  NUTRITION DIAGNOSIS:   Severe Malnutrition related to chronic illness(CHF, COPD) as evidenced by energy intake < 75% for > or equal to 1 month, moderate fat depletion, severe fat depletion, moderate muscle depletion, severe muscle depletion, edema.  Ongoing  GOAL:   Patient will meet greater than or equal to 90% of their needs  Progressing  MONITOR:   PO intake, Supplement acceptance, Labs, Weight trends, Skin, I & O's  REASON FOR ASSESSMENT:   Other (Comment)    ASSESSMENT:   73 year old pt with PMH of CHF, COPD, colon resection, HTN, hyperlipidemia, and DM II presents to the ED with Worsening SOB, bilateral edema, PNA (likely secondary to CHF and COPD.   9/17- s/p rt/lt heart cath and coronary angiography  Reviewed I/O's: -496 ml x 24 hours and -2.8 L since admission  UOP: 1.2 L x 24 hours  Pt receiving nursing care at time of visit.   [name] continues to be present and assists pt with eating and drinking supplements. Noted meal completion 50-75%. Pt is also consuming Ensure supplements. Pt with variable oral intake and malnutrition; wound continue to benefit from nutrient dense supplement. One Ensure Enlive supplement provides 350 kcals, 20 grams protein, and 44-45 grams of carbohydrate vs one Glucerna shake supplement, which provides 220 kcals, 10 grams of protein, and 26 grams of carbohydrate. Given pt's hx of DM, RD will continue to monitor PO intake, CBGS, and adjust supplement regimen as appropriate.   Labs reviewed: CBGS: 045-409 (inpatient orders for glycemic control are 0-5 units insulin aspart q HS, 0-9 units insulin  aspart TID with meals, and 2.5 mg glipizide q AM). Prednisone decrease from 40 mg to 20 mg today.   Diet Order:   Diet Order            Diet Carb Modified Fluid consistency: Thin; Room service appropriate? Yes  Diet effective now              EDUCATION NEEDS:   Education needs have been addressed  Skin:  Skin Assessment: Reviewed RN Assessment  Last BM:  03/17/18  Height:   Ht Readings from Last 1 Encounters:  03/15/18 _0  (1.702 m)    Weight:   Wt Readings from Last 1 Encounters:  03/20/18 51.5 kg    Ideal Body Weight:  61.4 kg  BMI:  Body mass index is 17.78 kg/m.  Estimated Nutritional Needs:   Kcal:  1650-1850  Protein:  85-100 grams  Fluid:  1.5 L    Michale Emmerich A. Jimmye Norman, RD, LDN, [place] Registered Dietitian II Certified Diabetes Care and Education Specialist Pager: [phone] After hours Pager: [phone]

## 2018-03-21 ENCOUNTER — Encounter (HOSPITAL_COMMUNITY): Payer: Medicare HMO

## 2018-03-21 LAB — BASIC METABOLIC PANEL
Anion gap: 12 (ref 5–15)
BUN: 23 mg/dL (ref 8–23)
CO2: 29 mmol/L (ref 22–32)
CREATININE: 1.09 mg/dL — AB (ref 0.44–1.00)
Calcium: 9.7 mg/dL (ref 8.9–10.3)
Chloride: 96 mmol/L — ABNORMAL LOW (ref 98–111)
GFR calc Af Amer: 59 mL/min — ABNORMAL LOW (ref >60)
GFR calc non Af Amer: 51 mL/min — ABNORMAL LOW (ref >60)
Glucose, Bld: 222 mg/dL — ABNORMAL HIGH (ref 70–99)
Potassium: 3.6 mmol/L (ref 3.5–5.1)
Sodium: 137 mmol/L (ref 135–145)

## 2018-03-21 LAB — MULTIPLE MYELOMA PANEL, SERUM
ALBUMIN/GLOB SERPL: 1.5 (ref 0.7–1.7)
Albumin SerPl Elph-Mcnc: 2.7 g/dL — ABNORMAL LOW (ref 2.9–4.4)
Alpha 1: 0.2 g/dL (ref 0.0–0.4)
Alpha2 Glob SerPl Elph-Mcnc: 0.7 g/dL (ref 0.4–1.0)
B-Globulin SerPl Elph-Mcnc: 0.7 g/dL (ref 0.7–1.3)
Gamma Glob SerPl Elph-Mcnc: 0.3 g/dL — ABNORMAL LOW (ref 0.4–1.8)
Globulin, Total: 1.9 g/dL — ABNORMAL LOW (ref 2.2–3.9)
IgA: 176 mg/dL (ref 64–422)
IgG (Immunoglobin G), Serum: 374 mg/dL — ABNORMAL LOW (ref 700–1600)
IgM (Immunoglobulin M), Srm: 25 mg/dL — ABNORMAL LOW (ref 26–217)
Total Protein ELP: 4.6 g/dL — ABNORMAL LOW (ref 6.0–8.5)

## 2018-03-21 LAB — CYCLIC CITRUL PEPTIDE ANTIBODY, IGG/IGA: CCP ANTIBODIES IGG/IGA: 7 U (ref 0–19)

## 2018-03-21 LAB — GLUCOSE, CAPILLARY
Glucose-Capillary: 184 mg/dL — ABNORMAL HIGH (ref 70–99)
Glucose-Capillary: 157 mg/dL — ABNORMAL HIGH (ref 70–99)

## 2018-03-21 MED ORDER — SPIRONOLACTONE 25 MG PO TABS: 12.5000 mg | ORAL_TABLET | Freq: Every day | ORAL | 0 refills | Status: DC

## 2018-03-21 MED ORDER — CARVEDILOL 6.25 MG PO TABS: 6.2500 mg | ORAL_TABLET | Freq: Two times a day (BID) | ORAL | 0 refills | Status: DC

## 2018-03-21 MED ORDER — SODIUM CHLORIDE 0.9 % IV SOLN
2.0000 g | Freq: Once | INTRAVENOUS | Status: AC
  Administered 2018-03-21: 2 g via INTRAVENOUS
  Filled 2018-03-21: qty 20

## 2018-03-21 MED ORDER — SPIRONOLACTONE 25 MG PO TABS: 25.0000 mg | ORAL_TABLET | Freq: Every day | ORAL | Status: DC

## 2018-03-21 MED ORDER — PREDNISONE 20 MG PO TABS: 20.0000 mg | ORAL_TABLET | Freq: Every day | ORAL | 0 refills | Status: DC

## 2018-03-21 MED ORDER — FLUTICASONE FUROATE-VILANTEROL 100-25 MCG/INH IN AEPB: 1.0000 | INHALATION_SPRAY | Freq: Every day | RESPIRATORY_TRACT | 0 refills | Status: DC

## 2018-03-21 MED ORDER — INSULIN ASPART 100 UNIT/ML ~~LOC~~ SOLN: 3.0000 [IU] | Freq: Three times a day (TID) | SUBCUTANEOUS | Status: DC

## 2018-03-21 MED ORDER — ADULT MULTIVITAMIN W/MINERALS CH: 1.0000 | ORAL_TABLET | Freq: Every day | ORAL | Status: DC

## 2018-03-21 MED ORDER — GLIPIZIDE 5 MG PO TABS: 2.5000 mg | ORAL_TABLET | Freq: Every day | ORAL | 0 refills | Status: DC

## 2018-03-21 MED ORDER — ASPIRIN 81 MG PO TBEC: 81.0000 mg | DELAYED_RELEASE_TABLET | Freq: Every day | ORAL | 0 refills | Status: DC

## 2018-03-21 MED ORDER — CINACALCET HCL 30 MG PO TABS: 30.0000 mg | ORAL_TABLET | ORAL | 0 refills | Status: DC

## 2018-03-21 MED ORDER — ISOSORB DINITRATE-HYDRALAZINE 20-37.5 MG PO TABS: 1.0000 | ORAL_TABLET | Freq: Three times a day (TID) | ORAL | 0 refills | Status: DC

## 2018-03-21 MED ORDER — IPRATROPIUM-ALBUTEROL 0.5-2.5 (3) MG/3ML IN SOLN: 3.0000 mL | Freq: Four times a day (QID) | RESPIRATORY_TRACT | Status: DC | PRN

## 2018-03-21 MED ORDER — ENSURE ENLIVE PO LIQD: 1.0000 | Freq: Two times a day (BID) | ORAL | 0 refills | Status: DC

## 2018-03-21 MED FILL — BREO ELLIPTA 100-25 MCG INH: 100-25 | 30 days supply | Qty: 60 | Fill #0

## 2018-03-21 MED FILL — glipiZIDE 5 MG TABS: 5 | 30 days supply | Qty: 15 | Fill #0

## 2018-03-21 MED FILL — ASPIRIN LOW DOSE 81 MG TBEC: 81 | 30 days supply | Qty: 30 | Fill #0

## 2018-03-21 MED FILL — CARVEDILOL 6.25 MG TABLET: 6.25 | 30 days supply | Qty: 60 | Fill #0

## 2018-03-21 MED FILL — BIDIL TABLET: 20-37.5 | 30 days supply | Qty: 90 | Fill #0

## 2018-03-21 MED FILL — predniSONE 20 MG TABS: 20 | 2 days supply | Qty: 2 | Fill #0

## 2018-03-21 MED FILL — CINACALCET HCL 30 MG TABS: 30 | 28 days supply | Qty: 12 | Fill #0

## 2018-03-21 MED FILL — SPIRONOLACTONE 25 MG TABLET: 25 | 60 days supply | Qty: 30 | Fill #0

## 2018-03-21 NOTE — Progress Notes (Signed)
[employer] coupon card given to patient's spouse with instructions of usage; Aneta Mins [phone]

## 2018-03-21 NOTE — Progress Notes (Addendum)
Advanced Heart Failure Rounding Note  PCP-Cardiologist: No primary care provider on file.   Subjective:    Started on Bidil and Spiro yesterday with EF 38% on cMRI. SBP 110-130s. Creatinine stable. Rhythm looks like WAP vs NSR with frequent PACs.   Feels fine. No CP or SOB. Did not walk yesterday. Hopeful to go home today. She wants medications through [place].   PYP 03/21/18: Not suggestive of amyloid. Qualitative grade 0, quantitative 1.4  High Res Chest CT 03/19/18: Moderate, predominantly centrilobular emphysema. There is dependent bibasilar scarring or atelectasis. No evidence of significant fibrotic interstitial lung disease.  Cardiac MRI 03/18/18:  1. Normal LV size with moderate focal basal septal hypertrophy. EF 38%, diffuse hypokinesis. 2. Normal RV size with moderately decreased systolic function, EF 97%. 3. Delayed enhancement imaging showed mid-wall LGE in the basal anteroseptal wall and to a lesser extent in the mid lateral wall. Given the finding of prominent LGE in the area of focal basal septal hypertrophy, this may be suggestive of hypertrophic cardiomyopathy. Cannot rule out cardiac amyloidosis but think this is less likely.  LHC/RHC:  Diagnostic  Dominance: Right  Left Main  No significant disease.  Left Anterior Descending  Luminal irregularities.  Left Circumflex  Luminal irregularities.  Right Coronary Artery  Luminal irregularities.  Intervention   No interventions have been documented.  Right Heart   Right Heart Pressures RHC Procedural Findings: Hemodynamics (mmHg) RA mean 7 RV 65/9 PA 59/22, mean 36 PCWP mean 9 LV 179/12 AO 176/95  Oxygen saturations: PA 66% AO 99%  Cardiac Output (Fick) 3.15  Cardiac Index (Fick) 2  PVR 8.6 WU    Objective:   Weight Range: 50.9 kg Body mass index is 17.57 kg/m.   Vital Signs:   Temp:  [97.3 F (36.3 C)-98.4 F (36.9 C)] 98.4 F (36.9 C) (03/19 0429) Pulse Rate:  [61-76] 67  (03/19 0429) Resp:  [16-18] 16 (03/19 0429) BP: (114-164)/(70-103) 114/82 (03/19 0429) SpO2:  [90 %-100 %] 100 % (03/19 0429) Weight:  [50.9 kg] 50.9 kg (03/19 0536) Last BM Date: 03/20/18  Weight change: Filed Weights   03/19/18 0258 03/20/18 0214 03/21/18 0536  Weight: 51.3 kg 51.5 kg 50.9 kg    Intake/Output:   Intake/Output Summary (Last 24 hours) at 03/21/2018 0816 Last data filed at 03/21/2018 7414 Gross per 24 hour  Intake 1180 ml  Output 800 ml  Net 380 ml      Physical Exam    General: No resp difficulty. HEENT: Normal Neck: Supple. No JVD. Carotids 2+ bilat; no bruits. No thyromegaly or nodule noted. Cor: PMI nondisplaced. RRR, No M/G/R noted Lungs: diminished in bases Abdomen: Soft, non-tender, non-distended, no HSM. No bruits or masses. +BS  Extremities: No cyanosis, clubbing, or rash. R and LLE trace ankle edema.  Neuro: Alert & orientedx3, cranial nerves grossly intact. moves all 4 extremities w/o difficulty. Affect pleasant  Telemetry   NSR with PACs vs WAP 70s. Personally reviewed.   EKG: NSR with PACs. Personally reviewed.   Labs    CBC Recent Labs    03/19/18 1030 03/20/18 0348  WBC 8.2 5.6  HGB 16.2* 15.3*  HCT 50.6* 48.7*  MCV 85.0 84.4  PLT 227 239   Basic Metabolic Panel Recent Labs    03/18/18 1631  03/19/18 1030 03/20/18 0348 03/21/18 0441  NA  --    < >  --  139 137  K  --    < >  --  3.8 3.6  CL  --    < >  --  101 96*  CO2  --    < >  --  30 29  GLUCOSE  --    < >  --  188* 222*  BUN  --    < >  --  23 23  CREATININE  --    < > 1.16* 1.02* 1.09*  CALCIUM  --    < >  --  10.1 9.7  MG 3.5*  --  1.8  --   --    < > = values in this interval not displayed.   Liver Function Tests No results for input(s): AST, ALT, ALKPHOS, BILITOT, PROT, ALBUMIN in the last 72 hours. No results for input(s): LIPASE, AMYLASE in the last 72 hours. Cardiac Enzymes Recent Labs    03/18/18 1115 03/18/18 1631 03/18/18 2318  TROPONINI  0.29* 0.26* 0.26*    BNP: BNP (last 3 results) Recent Labs    09/16/17 2005 03/15/18 2008  BNP 310.4* 1,914.0*    ProBNP (last 3 results) Recent Labs    02/20/18 1236 03/01/18 1218  PROBNP 1,057.0* 1,336.0*     D-Dimer No results for input(s): DDIMER in the last 72 hours. Hemoglobin A1C No results for input(s): HGBA1C in the last 72 hours. Fasting Lipid Panel Recent Labs    03/19/18 0433  CHOL 209*  HDL 46  LDLCALC 138*  TRIG 127  CHOLHDL 4.5   Thyroid Function Tests No results for input(s): TSH, T4TOTAL, T3FREE, THYROIDAB in the last 72 hours.  Invalid input(s): FREET3  Other results:   Imaging    Nm Cardiac Amyloid Tumor Loc Inflam Spect 1 Day  Result Date: 03/20/2018 72m[employer] -Pyrophosphate Imaging for Transthyretin Cardiac Amyloid Qualitative grade 0 no to minimum uptake Ratio quantitative myocardial to contralateral lung ratio is 1.4 Also indicating negative result for amyloid     Medications:     Scheduled Medications:  alum & mag hydroxide-simeth  15 mL Oral Once   aspirin EC  81 mg Oral Daily   azithromycin  500 mg Oral Q24H   carvedilol  6.25 mg Oral BID WC   cefdinir  300 mg Oral Q12H   cinacalcet  30 mg Oral Q M,W,F   famotidine  40 mg Oral BID   feeding supplement (ENSURE ENLIVE)  237 mL Oral BID BM   fluticasone furoate-vilanterol  1 puff Inhalation Daily   furosemide  20 mg Oral Daily   glipiZIDE  2.5 mg Oral QAC breakfast   heparin  5,000 Units Subcutaneous Q8H   insulin aspart  0-5 Units Subcutaneous QHS   insulin aspart  0-9 Units Subcutaneous TID WC   ipratropium-albuterol  3 mL Nebulization TID   isosorbide-hydrALAZINE  1 tablet Oral TID   losartan  100 mg Oral Daily   mirtazapine  15 mg Oral QHS   multivitamin with minerals  1 tablet Oral Daily   pantoprazole  40 mg Oral Daily   predniSONE  20 mg Oral Q breakfast   sodium chloride flush  3 mL Intravenous Q12H   sodium chloride flush  3 mL  Intravenous Q12H   spironolactone  12.5 mg Oral Daily    Infusions:  sodium chloride 10 mL/hr at 03/16/18 2322   sodium chloride 250 mL (03/19/18 1322)    PRN Medications: sodium chloride, sodium chloride, acetaminophen, calcium carbonate, Muscle Rub, ondansetron (ZOFRAN) IV, sodium chloride flush, sodium chloride flush     Assessment/Plan  1. Concern for PNA in setting of severe COPD: Afebrile, WBCs not elevated. She is being treated with prednisone 20 mg daily and cefdinir/azithromycin. PCT not elevated and CT chest did not show PNA. - Last day of abx is tomorrow per primary.  2. Acute on chronic primarily diastolic CHF: With prominent RV failure.  Echo this admission with EF down to 45-50%, LVH, moderate RV systolic dysfunction.  She has diuresed this admission with improving dyspnea. There is concern based on appearance of echo for infiltrative disease. RHC/LHC was done, showing no significant CAD and moderate pulmonary arterial hypertension.  Cardiac MRI showed LV EF 38%, moderate basal septal hypertrophy, RV EF 30%, mid-wall LGE in the basal anteroseptal wall and lesser extent in mid lateral wall.  Most concerning for hypertrophic CMP, less likely cardiac amyloidosis.  She does not have a known family history of HCM. Volume status stable on exam and filling pressures normal on RHC yesterday.  - Continue Lasix 20 mg daily.  - Myeloma panel pending. PYP not suggestive of amyloid. Qualitative grade 0, quantitative 1.4 - Continue coreg 6.25 mg BID  - Continue losartan 100 mg daily. Plan to switch to [facility]. SBP 110s overnight, so will not change today.  - Increase spiro to 25 mg daily.  - Continue Bidil 1 tab TID. CM checking price today.  3. CAD: Mild on cath.   - Continue ASA. No CP.  4. Pulmonary hypertension: RHC showed moderate pulmonary arterial hypertension with high PVR (8 WU).  V/Q scan (9/19) did not show chronic PE. CT chest high resolution showed no ILD, there was  moderate emphysema.  Pulmonary hypertension seems to be out of proportion to degree of lung disease.  Suspect mixed group 1 and group 3 PH.  Serologies sent and came back negative other than RF elevated. HIV negative.  - CCP antibody pending - It is reasonable to give her a trial of selective pulmonary vasodilators for component of group 1 PH.  Would start with Tyvaso to try to avoid V/Q mismatching given moderate emphysema.  Will need to do this as outpatient. Discussed this with her today.   I think she can go home today. Will confirm with Dr Aundra Dubin. She would like medications through [facility]. We let primary team know.    Heart failure team will sign off as of 03/21/18  HF Team Medication Recommendations for Home: (Dr Aundra Dubin to confirm) ASA 81 mg daily Coreg 6.25 mg BID Lasix 20 mg daily Bidil 1 tab TID (CM running price, if too expensive, can switch to Hydralazine 37.5 mg TID + Imdur 60 mg daily) Losartan 100 mg daily Spiro 12.5 mg daily  Other recommendations (Labs,testing, etc): BMET in 1 week at follow up  Follow up as an outpatient: 03/27/18 at 11 am in HF clinic  Length of Stay: [place], NP  03/21/2018, 8:16 AM  Advanced Heart Failure Team Pager [phone] (M-F; [place])  Please contact [facility] for night-coverage after hours (4p -7a ) and weekends on [email or web address]  Patient seen with NP, agree with the above note.   Stable, denies dyspnea.  She is on her baseline home oxygen.   On exam, JVP not elevated. Regular S1S2. Distant BS. Trace ankle edema.   1. Concern for PNA in setting of severe COPD:  She is on home oxygen 2L by [place] at baseline.  Afebrile, WBCs not elevated. She is being treated with prednisone 20 mg daily and cefdinir/azithromycin. PCT not elevated and  CT chest did not show PNA. - Plan to stop abx tomorrow.  2. Acute on chronic primarily diastolic CHF: With prominent RV failure. Echo this admission with EF down to 45-50%, LVH, moderate RV systolic  dysfunction. She has diuresed this admission with improving dyspnea. There is concern based on appearance of echo for infiltrative disease. RHC/LHC was done, showing no significant CAD and moderate pulmonary arterial hypertension.  Cardiac MRI showed LV EF 38%, moderate basal septal hypertrophy, RV EF 30%, mid-wall LGE in the basal anteroseptal wall and lesser extent in mid lateral wall.  Most concerning for hypertrophic CMP, less likely cardiac amyloidosis.  She does not have a known family history of HCM. PYP scan done, not suggestive of transthyretin amyloidosis.  Volume status looks ok on exam and filling pressures normal on RHC.  - Lasix 20 mg daily.  - Myeloma panel pending but think less likely amyloidosis.   - With moderately depressed LV EF, will continue Coreg and losartan (transition eventually to [facility]).  - Continue spironolactone and Bidil at current doses.   3. CAD: Mild on cath.  - Continue ASA.  4. Pulmonary hypertension: RHC showed moderate pulmonary arterial hypertension with high PVR (8 WU).  V/Q scan (9/19) did not show chronic PE. CT chest high resolution showed no ILD, there was moderate emphysema.  Pulmonary hypertension seems to be out of proportion to degree of lung disease.  Suspect mixed group 1 and group 3 PH.  Serologies sent, RF elevated. HIV negative, ANA negative.  - Pending CCP antibody.  - I think reasonable to give her a trial of selective pulmonary vasodilators for component of group 1 PH.  Would start with Tyvaso to try to avoid V/Q mismatching given moderate emphysema.  Will need to do this as outpatient.   From my standpoint, she can go home.  She will need to go home on the medications listed above in NP note, will need followup in CHF clinic to start Tyvaso.   Loralie Champagne 03/21/2018 10:11 AM

## 2018-03-21 NOTE — Progress Notes (Signed)
Physical Therapy Treatment Patient Details Name: Denise [name] MRN: [id number] DOB: 1945/02/01 Today's Date: 03/21/2018    History of Present Illness Patient is a 73 y.o. female admitted 03/15/18 with SOB, CP and weakness; worked up for CHF exacerbation and respiratory failure. CXR shows left lingular PNA. S/p cath 3/17. PMH includes COPD, chronic hypoxic respiratory failure on 2 L home oxygen, HTN, DM, HLD, colon cancer, pulmonary HTN.   PT Comments    Pt progressing with mobility. Ambulatory without device and intermittent min guard for balance; stability improved with use of RW. Husband present at beginning of session; educ pt and husband on fall risk reduction and recommendation for initial use of RW. SpO2 down to 83% on 2L O2 [place] (see saturations qualifications note); pt reports baseline is wearing 2L O2 at home. Pt planning for d/c home today; will have necessary support from family.    Follow Up Recommendations  Home health PT;Supervision for mobility/OOB     Equipment Recommendations  Rolling walker with 5" wheels    Recommendations for Other Services       Precautions / Restrictions Precautions Precautions: Fall;Other (comment) Precaution Comments: Watch SpO2 Restrictions Weight Bearing Restrictions: No    Mobility  Bed Mobility               General bed mobility comments: Received sitting EOB  Transfers Overall transfer level: Needs assistance Equipment used: None Transfers: Sit to/from Stand Sit to Stand: Min guard            Ambulation/Gait Ambulation/Gait assistance: Supervision;Min guard Gait Distance (Feet): 140 Feet Assistive device: Rolling walker (2 wheeled);None Gait Pattern/deviations: Step-through pattern;Decreased stride length;Trunk flexed Gait velocity: Decreased Gait velocity interpretation: 1.31 - 2.62 ft/sec, indicative of limited community ambulator General Gait Details: Initial ambulation with RW and supervision for safety,  increased time with turns. Additional hallway ambulation without DME, intermittent min guard for balance due to 2x self-corrected instability. SpO2 down to 83% on 2L O2 . Bowel incontinence while walking   Stairs             Wheelchair Mobility    Modified Rankin (Stroke Patients Only)       Balance Overall balance assessment: Needs assistance Sitting-balance support: Feet supported;No upper extremity supported Sitting balance-Leahy Scale: Good     Standing balance support: During functional activity Standing balance-Leahy Scale: Fair                              Cognition Arousal/Alertness: Awake/alert Behavior During Therapy: WFL for tasks assessed/performed Overall Cognitive Status: Impaired/Different from baseline Area of Impairment: Memory;Following commands;Safety/judgement;Awareness;Problem solving                       Following Commands: Follows multi-step commands inconsistently;Follows one step commands with increased time;Follows one step commands inconsistently Safety/Judgement: Decreased awareness of safety;Decreased awareness of deficits Awareness: Emergent Problem Solving: Slow processing;Requires verbal cues General Comments: Generally slow to process; repetition to follow commands and complete tasks. Pt unaware of bowel incontinence on legs while walking, required cues to bring attention to this and to clean herself. Per PT evaluation, sister reports cognition has progressively been getting worse      Exercises      General Comments General comments (skin integrity, edema, etc.): Husband present; pt preparing for d/c home today      Pertinent Vitals/Pain Pain Assessment: No/denies pain    Home Living  Prior Function            PT Goals (current goals can now be found in the care plan section) Acute Rehab PT Goals Patient Stated Goal: Return home today PT Goal Formulation: With  patient/family Time For Goal Achievement: 04/02/18 Potential to Achieve Goals: Good Progress towards PT goals: Progressing toward goals    Frequency    Min 3X/week      PT Plan Current plan remains appropriate    Co-evaluation              AM-PAC PT "6 Clicks" Mobility   Outcome Measure  Help needed turning from your back to your side while in a flat bed without using bedrails?: None Help needed moving from lying on your back to sitting on the side of a flat bed without using bedrails?: None Help needed moving to and from a bed to a chair (including a wheelchair)?: A Little Help needed standing up from a chair using your arms (e.g., wheelchair or bedside chair)?: A Little Help needed to walk in hospital room?: A Little Help needed climbing 3-5 steps with a railing? : A Little 6 Click Score: 20    End of Session Equipment Utilized During Treatment: Oxygen;Gait belt Activity Tolerance: Patient tolerated treatment well Patient left: in bed;with call bell/phone within reach;with bed alarm set Nurse Communication: Mobility status PT Visit Diagnosis: Difficulty in walking, not elsewhere classified (R26.2);Unsteadiness on feet (R26.81)     Time: 9924-2683 PT Time Calculation (min) (ACUTE ONLY): 29 min  Charges:  $Gait Training: 8-22 mins $Self Care/Home Management: [place], PT, DPT Acute Rehabilitation Services  Pager [phone] Office [place] 03/21/2018, 10:45 AM

## 2018-03-21 NOTE — Progress Notes (Signed)
SATURATION QUALIFICATIONS: (This note is used to comply with regulatory documentation for home oxygen)  Patient Saturations on 2 Liters of oxygen at Rest = 95%  Patient Saturations on 2 Liters of oxygen while Ambulating = 83%  Patient Saturations on 3 Liters of oxygen while Ambulating = 91%  Please briefly explain why patient needs home oxygen: Pt currently requires 3L O2 [place] to maintain SpO2 >/88% with mobility.  Mabeline Caras, PT, DPT Acute Rehabilitation Services  Pager [phone] Office [phone]

## 2018-03-21 NOTE — TOC Benefit Eligibility Note (Signed)
Transition of Care [facility]) Benefit Eligibility Note    Patient Details  Name: Denise [name] MRN: [id number] Date of Birth: April 18, 1945   Medication/Dose: Bidil 20-37.13m TID  Covered?: Yes   Prescription Coverage Preferred Pharmacy: [facility]  Spoke with Person/Company/Phone Number:: Veronica/ [employer]/ [phone] Co-Pay: $1.50  Prior Approval: No    [place] Number: 03/21/2018, 11:12 AM

## 2018-03-21 NOTE — Discharge Instructions (Signed)

## 2018-03-21 NOTE — Discharge Summary (Signed)
Physician Discharge Summary  Denise [name] Plan EAV:[id number] DOB: 01/16/1945 DOA: 03/15/2018  PCP: [name], NP  Admit date: 03/15/2018 Discharge date: 03/21/2018  Time spent: 45 minutes  Recommendations for Outpatient Follow-up:  Patient will be discharged to home home health physical therapy and oxygen.  Patient will need to follow up with primary care provider within one week of discharge, repeat BMP.  Follow up with cardiology at specified time. Patient should continue medications as prescribed.  Patient should follow a heart healthy/carb modified diet.   Discharge Diagnoses:  Acute on chronic diastolic heart failure exacerbation with pulmonary hypertension Acute on chronic hypoxic respiratory failure Left lingular pneumonia/community-acquired pneumonia Acute COPD exacerbation Pulmonary hypertension Elevated troponin Hypokalemia/hypomagnesemia Mild acute kidney injury Essential hypertension Diabetes mellitus, type II  Discharge Condition: Stable  Diet recommendation: heart healthy/carb modified   Filed Weights   03/19/18 0258 03/20/18 0214 03/21/18 0536  Weight: 51.3 kg 51.5 kg 50.9 kg    History of present illness:  on 03/15/2018 by Dr. Karie Kirks The patient is a 73 yr old woman who carries a past medical history significant for COPD with chronic respiratory requiring 2L O2, DM II, HTN, hyperlipidemia, colon cancer, SBO, pulmonary hypertension. She states that the shortness of breath actually started about a month ago, but has gotten much worse in the last week. She states that she also has had chest pain and tightness at times. She cannot say if this chest pain was associated with exertion or not. The patient ran out of her inhalers a week ago. She has been taking prednisone daily. She has developed severe edema in her lower extremities bilaterally. She denies fever or chills. She states that she has had a cough and sneeze. There has been no nausea/vomiting,  diarrhea or constipation. Today family noted that the patient was confused and brought her to the ED.  Hospital Course:  Acute on chronic diastolic heart failure exacerbation with pulmonary hypertension -Presented with significant leg edema on admission, BNP of 1914 -Patient reports that her weight has been of approximately 130 pounds in the past several months -Echocardiogram shows an EF of 45 to 50%, asymmetric septal hypertrophy myocardial appearing suggestive of infiltrative process, diffuse hypokinesia with restrictive diastolic dysfunction -Cardiology consulted and appreciated -Status post heart catheterization showing moderate pulmonary arterial hypertension with PVR 8.5WU -was placed on IV Lasix and transitioned to oral lasix, spironolactone added -Continue losartan, Coreg -Cardiology also ordered myeloma panel and PYP to rule out amyloidosis- PYP not suggestive of amyloid -Patient may eventually need Entresto (to be decided as an outpatient) -will discharge patient with ASA 28m daily , Coreg 6.1252mBID, Lasix 2028maily, Bidil 1tab TID, losartan 100m72miily, Spironolactone 12.5mg 40mly  -Follow up with heart failure clinic on 03/27/18 at 11am with repeat BMP in one week  Acute on chronic hypoxic respiratory failure -Likely secondary to multifactorial causes including CHF exacerbation, COPD, pneumonia -Patient currently on 2 L of oxygen- saturation at rest 95%; however ambulating down to 83%- patient will need 3L Oxygen with ambulation  -Continue diuresis, bronchodilators, steroid taper -Patient follows with pulmonology, Dr. Wert Melvyn Novasn outpatient  Left lingular pneumonia/community-acquired pneumonia -Blood cultures unremarkable -Influenza PCR and respiratory viral panel negative -Patient currently on Omnicef and azithromycin and has completed antibiotic course during hospitalization  -Continue bronchodilators and supplemental oxygen  Acute COPD exacerbation -Continue  prednisone taper (2 days more at discharge), duo nebs, supplemental oxygen, bronchodilators, Breo  Pulmonary hypertension -HRCT ordered to look for interstitial lung disease  along with serology/autoimmune work-up per CHF team (so far only RF +)  -CT showed moderate, predominantly centrilobular emphysema.  No evidence of significant fibrotic interstitial lung disease. -Cardiology may start Tyvaso as an outpatient  Elevated troponin -Peaked at 0.44 -In the setting of CHF, hypoxic respiratory failure, COPD exacerbation -Treatment and work-up as above  Hypokalemia/hypomagnesemia -Resolve with replacement  Mild acute kidney injury -Improving, creatinine 1.09 today  Essential hypertension -BP improving -Continue Coreg, losartan, Lasix, Spironolactone  Diabetes mellitus, type II -Hemoglobin A1c 9 -Continue glipizide on discharge -follow up with PCP -Suspect A1c is elevated due to patient being on prednisone and steroid injection in the past couple of months  Consultants Cardiology  Procedures  Echoardiogram Left/Rigth heart cath Discharge Exam: Vitals:   03/21/18 0819 03/21/18 1154  BP: 132/89 (!) 117/91  Pulse: (!) 56 67  Resp:  17  Temp: 98.5 F (36.9 C) (!) 97.5 F (36.4 C)  SpO2: 100% 100%     General: Well developed, chronically ill appearing, NAD  HEENT: NCAT, mucous membranes moist.  Cardiovascular: S1 S2 auscultated,RRR, no murmur  Respiratory: Clear to auscultation bilaterally with equal chest rise  Abdomen: Soft, nontender, nondistended, + bowel sounds  Extremities: warm dry without cyanosis clubbing. Edema improved  Neuro: AAOx3, nonfocal  Skin: Without rashes exudates or nodules  Psych: Appropriate mood and affect, pleasent  Discharge Instructions Discharge Instructions    Diet - low sodium heart healthy   Complete by:  As directed    Discharge instructions   Complete by:  As directed    Patient will be discharged to home home health  physical therapy and oxygen.  Patient will need to follow up with primary care provider within one week of discharge, repeat BMP.  Follow up with cardiology at specified time. Patient should continue medications as prescribed.  Patient should follow a heart healthy/carb modified diet.   Increase activity slowly   Complete by:  As directed      Allergies as of 03/21/2018   No Known Allergies     Medication List    STOP taking these medications   metoprolol tartrate 25 MG tablet Commonly known as:  LOPRESSOR     TAKE these medications   albuterol 108 (90 Base) MCG/ACT inhaler Commonly known as:  PROVENTIL HFA;VENTOLIN HFA INHALE 1-2 PUFFS INTO THE LUNGS EVERY 4 HOURS AS NEEDED FOR WHEEZING OR SHORTNESS OF BREATH. What changed:  See the new instructions.   aspirin 81 MG EC tablet Take 1 tablet (81 mg total) by mouth daily. Start taking on:  March 22, 2018   carvedilol 6.25 MG tablet Commonly known as:  COREG Take 1 tablet (6.25 mg total) by mouth 2 (two) times daily with a meal.   cinacalcet 30 MG tablet Commonly known as:  SENSIPAR Take 1 tablet (30 mg total) by mouth every Monday, Wednesday, and Friday. Start taking on:  March 22, 2018   famotidine 40 MG tablet Commonly known as:  PEPCID TAKE 1 TABLET BY MOUTH TWICE A DAY What changed:  when to take this   feeding supplement (ENSURE ENLIVE) Liqd Take 237 mLs by mouth 2 (two) times daily between meals.   fluticasone furoate-vilanterol 100-25 MCG/INH Aepb Commonly known as:  BREO ELLIPTA Inhale 1 puff into the lungs daily. Start taking on:  March 22, 2018   furosemide 20 MG tablet Commonly known as:  Lasix Take 1 tablet (20 mg total) by mouth daily.   glipiZIDE 5 MG tablet Commonly known as:  GLUCOTROL Take 0.5 tablets (2.5 mg total) by mouth daily before breakfast. Start taking on:  March 22, 2018   isosorbide-hydrALAZINE 20-37.5 MG tablet Commonly known as:  BIDIL Take 1 tablet by mouth 3 (three) times  daily.   losartan 100 MG tablet Commonly known as:  COZAAR Take 1 tablet by mouth once daily for blood pressure. What changed:    how much to take  how to take this  when to take this  additional instructions   mirtazapine 15 MG tablet Commonly known as:  REMERON TAKE 1 TABLET BY MOUTH AT BEDTIME. FOR DEPRESSION AND APPETITE. What changed:  See the new instructions.   multivitamin with minerals Tabs tablet Take 1 tablet by mouth daily. Start taking on:  March 22, 2018   omeprazole 20 MG capsule Commonly known as:  PRILOSEC Take 20 mg by mouth daily.   OXYGEN Inhale 2 L into the lungs continuous.   potassium chloride SA 20 MEQ tablet Commonly known as:  K-DUR,KLOR-CON Take 1 tablet (20 mEq total) by mouth 2 (two) times daily. For low potassium. What changed:  when to take this   predniSONE 20 MG tablet Commonly known as:  DELTASONE Take 1 tablet (20 mg total) by mouth daily with breakfast. Completion of hospital course Start taking on:  March 22, 2018   spironolactone 25 MG tablet Commonly known as:  ALDACTONE Take 0.5 tablets (12.5 mg total) by mouth daily. Start taking on:  March 22, 2018   Tiotropium Bromide-Olodaterol 2.5-2.5 MCG/ACT Aers Commonly known as:  Stiolto Respimat Inhale 2 puffs into the lungs daily.      No Known Allergies Follow-up Information    [facility] Follow up.   Specialty:  [place] Why:  They will do your home health care at your home Contact information: [place] [address] [phone]         [facility] Follow up on 03/27/2018.   Specialty:  Cardiology Why:  Heart Failure Follow up 03/27/2018 @ [facility] in the [place] on [address] (garage code:[id number]  , elevator to 1st floor).  -Take all am meds and bring all med bottles  Contact information: [address] [id number] [place]       [name], NP. Schedule an appointment as soon as possible for a visit in 1 week.   Specialty:  Internal Medicine Why:  Hosptial follow up: March 27 11:20am Contact information: [place] [address] [phone]            The results of significant diagnostics from this hospitalization (including imaging, microbiology, ancillary and laboratory) are listed below for reference.    Significant Diagnostic Studies: Dg Chest 2 View  Result Date: 03/15/2018 CLINICAL DATA:  Shortness of breath. Leg edema. EXAM: CHEST - 2 VIEW COMPARISON:  09/18/2017. FINDINGS: Mildly enlarged cardiac silhouette. Interval patchy density in the left lower lobe and lingula. Stable hyperexpansion of the lungs with prominent interstitial markings and peribronchial thickening. Diffuse osteopenia with stable mild upper thoracic vertebral compression deformities. IMPRESSION: 1. Left lower lobe and lingular pneumonia. 2. Stable cardiomegaly and changes of COPD and chronic bronchitis. Electronically Signed   By: Claudie Revering M.D.   On: 03/15/2018 17:33   Nm Cardiac Amyloid Tumor Loc Inflam Spect 1 Day  Result Date: 03/20/2018 67mTechnetium -Pyrophosphate Imaging for Transthyretin Cardiac Amyloid Qualitative grade  0 no to minimum uptake Ratio quantitative myocardial to contralateral lung ratio is 1.4 Also indicating negative result for amyloid   Ct Chest High Resolution  Result Date: 03/19/2018 CLINICAL DATA:  Interstitial lung disease, emphysema EXAM: CT CHEST WITHOUT CONTRAST TECHNIQUE: Multidetector CT imaging of the chest was performed following the standard protocol without intravenous contrast. High resolution imaging of the lungs, as well as inspiratory and expiratory imaging, was performed. COMPARISON:  None. FINDINGS: Cardiovascular: Coronary artery calcifications. Calcific atherosclerosis of the thoracic aorta. Normal heart size. No pericardial effusion.  Mediastinum/Nodes: No enlarged mediastinal, hilar, or axillary lymph nodes. Enlarged thyroid. Trachea, and esophagus demonstrate no significant findings. Lungs/Pleura: Moderate, predominantly centrilobular emphysema. There is dependent bibasilar scarring or atelectasis. No evidence of significant fibrotic interstitial lung disease. No evidence of air trapping. Small left pleural effusion. Upper Abdomen: No acute abnormality. Musculoskeletal: No chest wall mass or suspicious bone lesions identified. IMPRESSION: 1. Moderate, predominantly centrilobular emphysema. There is dependent bibasilar scarring or atelectasis. No evidence of significant fibrotic interstitial lung disease. 2.  Small left pleural effusion. 3.  Coronary artery disease. 4.  Thyroid goiter. Electronically Signed   By: Eddie Candle M.D.   On: 03/19/2018 16:53   Mr Cardiac Morphology W Wo Contrast  Result Date: 03/19/2018 CLINICAL DATA:  ?Cardiac amyloidosis or hypertrophic cardiomyopathy EXAM: CARDIAC MRI TECHNIQUE: The patient was scanned on a 1.5 Tesla GE magnet. A dedicated cardiac coil was used. Functional imaging was done using Fiesta sequences. 2,3, and 4 chamber views were done to assess for RWMA's. Modified Simpson's rule using a short axis stack was used to calculate an ejection fraction on a dedicated work Conservation officer, nature. The patient received 8 cc of Gadavist. After 10 minutes inversion recovery sequences were used to assess for infiltration and scar tissue. FINDINGS: Limited images of the lung fields show no gross abnormalities. Small pericardial effusion. Normal left ventricular size with moderate focal basal septal hypertrophy. Moderate diffuse hypokinesis, EF 38%. Normal right ventricular size with moderate diffuse hypokinesis, EF 30%. Mild left atrial enlargement. Normal right atrial size. Trileaflet aortic valve, no definite regurgitation or stenosis noted. No significant mitral regurgitation noted. No systolic  anterior motion of the mitral valve noted. On delayed enhancement imaging, there was mid-wall late gadolinium enhancement (LGE) in the basal anteroseptal wall at the site of asymmetric LV hypertrophy. There was a small area of mid-wall LGE in the mid lateral wall. Measurements: LVEDV 114 mL LVSV 43 mL LVEF 38% RVEDV 153 mL RVSV 46 mL RVEF 30% IMPRESSION: 1. Normal LV size with moderate focal basal septal hypertrophy. EF 38%, diffuse hypokinesis. 2. Normal RV size with moderately decreased systolic function, EF 33%. 3. Delayed enhancement imaging showed mid-wall LGE in the basal anteroseptal wall and to a lesser extent in the mid lateral wall. Given the finding of prominent LGE in the area of focal basal septal hypertrophy, this may be suggestive of hypertrophic cardiomyopathy. Cannot rule out cardiac amyloidosis but think this is less likely. Dalton Mclean Electronically Signed   By: Loralie Champagne M.D.   On: 03/19/2018 16:27    Microbiology: Recent Results (from the past 240 hour(s))  Culture, blood (routine x 2) Call MD if unable to obtain prior to antibiotics being given     Status: None   Collection Time: 03/15/18  8:09 PM  Result Value Ref Range Status   Specimen Description BLOOD RIGHT HAND  Final   Special Requests   Final    BOTTLES DRAWN AEROBIC ONLY  Blood Culture results may not be optimal due to an inadequate volume of blood received in culture bottles   Culture   Final    NO GROWTH 5 DAYS Performed at [facility] [address], [place], [place] [address]    Report Status 03/20/2018 FINAL  Final  Culture, blood (routine x 2) Call MD if unable to obtain prior to antibiotics being given     Status: None   Collection Time: 03/15/18  9:11 PM  Result Value Ref Range Status   Specimen Description BLOOD LEFT ANTECUBITAL  Final   Special Requests   Final    BOTTLES DRAWN AEROBIC ONLY Blood Culture results may not be optimal due to an inadequate volume of blood received in culture  bottles   Culture   Final    NO GROWTH 5 DAYS Performed at [facility], [place] [address], [place], [place] [address]    Report Status 03/20/2018 FINAL  Final  Respiratory Panel by PCR     Status: None   Collection Time: 03/17/18  8:41 AM  Result Value Ref Range Status   Adenovirus NOT DETECTED NOT DETECTED Final   Coronavirus 229E NOT DETECTED NOT DETECTED Final    Comment: (NOTE) The Coronavirus on the Respiratory Panel, DOES NOT test for the novel  Coronavirus (2019 nCoV)    Coronavirus HKU1 NOT DETECTED NOT DETECTED Final   Coronavirus NL63 NOT DETECTED NOT DETECTED Final   Coronavirus OC43 NOT DETECTED NOT DETECTED Final   Metapneumovirus NOT DETECTED NOT DETECTED Final   Rhinovirus / Enterovirus NOT DETECTED NOT DETECTED Final   Influenza A NOT DETECTED NOT DETECTED Final   Influenza B NOT DETECTED NOT DETECTED Final   Parainfluenza Virus 1 NOT DETECTED NOT DETECTED Final   Parainfluenza Virus 2 NOT DETECTED NOT DETECTED Final   Parainfluenza Virus 3 NOT DETECTED NOT DETECTED Final   Parainfluenza Virus 4 NOT DETECTED NOT DETECTED Final   Respiratory Syncytial Virus NOT DETECTED NOT DETECTED Final   Bordetella pertussis NOT DETECTED NOT DETECTED Final   Chlamydophila pneumoniae NOT DETECTED NOT DETECTED Final   Mycoplasma pneumoniae NOT DETECTED NOT DETECTED Final    Comment: Performed at [facility], [place] [address], [place], [place] [address]     Labs: Basic Metabolic Panel: Recent Labs  Lab 03/16/18 [phone] 03/17/18 0847 03/18/18 3007 03/18/18 1631 03/19/18 0433 03/19/18 0819 03/19/18 1030 03/20/18 0348 03/21/18 0441  NA  --  139 142  --  142 141   141  --  139 137  K  --  5.0 4.0  --  3.8 3.2*   3.2*  --  3.8 3.6  CL  --  103 105  --  98  --   --  101 96*  CO2  --  33* 30  --  32  --   --  30 29  GLUCOSE  --  406* 176*  --  262*  --   --  188* 222*  BUN  --  19 18  --  20  --   --  23 23  CREATININE  --  1.22* 0.96  --  1.30*  --  1.16* 1.02* 1.09*   CALCIUM  --  10.7* 10.9*  --  10.7*  --   --  10.1 9.7  MG 1.1* 1.1*  --  3.5*  --   --  1.8  --   --    Liver Function Tests: Recent Labs  Lab 03/15/18 1652  AST 35  ALT 24  ALKPHOS 106  BILITOT 1.4*  PROT 5.5*  ALBUMIN 3.3*   No results for input(s): LIPASE, AMYLASE in the last 168 hours. No results for input(s): AMMONIA in the last 168 hours. CBC: Recent Labs  Lab 03/15/18 2008 03/18/18 0370 03/19/18 0433 03/19/18 0819 03/19/18 1030 03/20/18 0348  WBC 8.1 6.2 6.8  --  8.2 5.6  HGB 14.5 13.4 14.9 13.9   13.9 16.2* 15.3*  HCT 47.6* 43.3 45.6 41.0   41.0 50.6* 48.7*  MCV 88.1 86.3 84.9  --  85.0 84.4  PLT 181 169 161  --  227 209   Cardiac Enzymes: Recent Labs  Lab 03/15/18 1652 03/18/18 1115 03/18/18 1631 03/18/18 2318  TROPONINI 0.44* 0.29* 0.26* 0.26*   BNP: BNP (last 3 results) Recent Labs    09/16/17 2005 03/15/18 2008  BNP 310.4* 1,914.0*    ProBNP (last 3 results) Recent Labs    02/20/18 1236 03/01/18 1218  PROBNP 1,057.0* 1,336.0*    CBG: Recent Labs  Lab 03/20/18 1234 03/20/18 1659 03/20/18 2108 03/21/18 0611 03/21/18 1132  GLUCAP 163* 246* 427* 184* 157*       Signed:  Rilei Kravitz  [facility] 03/21/2018, 12:49 PM

## 2018-03-21 NOTE — Progress Notes (Signed)
Benefit check in progress for Haywood Pao [phone]

## 2018-03-21 NOTE — Progress Notes (Signed)
Inpatient Diabetes Program Recommendations  AACE/ADA: New Consensus Statement on Inpatient Glycemic Control (2015)  Target Ranges:  Prepandial:   less than 140 mg/dL      Peak postprandial:   less than 180 mg/dL (1-2 hours)      Critically ill patients:  140 - 180 mg/dL   Lab Results  Component Value Date   GLUCAP 184 (H) 03/21/2018   HGBA1C 9.3 (H) 03/17/2018    Results for EXA, BOMBA (MRN [id number]) as of 03/21/2018 10:51  Ref. Range 03/20/2018 06:29 03/20/2018 12:34 03/20/2018 16:59 03/20/2018 21:08 03/21/2018 06:11  Glucose-Capillary Latest Ref Range: 70 - 99 mg/dL 190 (H) 163 (H) 246 (H) 427 (H) 184 (H)     Review of Glycemic Control  Diabetes history: DM2  Outpatient Diabetes medications: None  Current orders for Inpatient glycemic control:  Glucotrol 2.5 mg daily                                                                            Novolog sensitive (0-9 units) tid and (0-5 units) hs   Prednisone 20 mg daily      Inpatient Diabetes Program Recommendations:     MD please consider adding Novolog 3 units TID with meals (Please add the following hold parameters: Hold if NPO or patient eats less than 50% of meal.)     Thank you.  -- Will follow during hospitalization.--  Jonna Clark RN, MSN Diabetes Coordinator Inpatient Glycemic Control Team Team Pager: [phone] (8am-5pm)

## 2018-03-21 NOTE — Progress Notes (Signed)
Patient c/o dizziness while getting antibiotic for few minute, v/s checked, v/s okay see computer for v/s, patient said she is clear and feeling okay now. MD notified, MD said okay to d/c patient home. Will d/c pt. Per MD order

## 2018-03-22 ENCOUNTER — Telehealth (HOSPITAL_COMMUNITY): Payer: Self-pay

## 2018-03-22 ENCOUNTER — Telehealth: Payer: Self-pay

## 2018-03-22 ENCOUNTER — Ambulatory Visit: Payer: Medicare HMO | Admitting: Cardiovascular Disease

## 2018-03-22 NOTE — Telephone Encounter (Signed)
Relayed message to pt, verbalized understanding with no further questions. Stated she was feeling great and was ok with cancelling her appt.

## 2018-03-22 NOTE — Telephone Encounter (Signed)
-----  Message from Larey Dresser, MD sent at 03/22/2018  8:38 AM EDT ----- Negative, doubt rheumatoid arthritis

## 2018-03-22 NOTE — Telephone Encounter (Signed)
LM requesting call back to complete TCM and schedule hospital follow up.   

## 2018-03-25 ENCOUNTER — Other Ambulatory Visit: Payer: Self-pay | Admitting: Primary Care

## 2018-03-25 ENCOUNTER — Other Ambulatory Visit: Payer: Self-pay | Admitting: Internal Medicine

## 2018-03-25 DIAGNOSIS — J9621 Acute and chronic respiratory failure with hypoxia: Secondary | ICD-10-CM | POA: Diagnosis not present

## 2018-03-25 DIAGNOSIS — I2510 Atherosclerotic heart disease of native coronary artery without angina pectoris: Secondary | ICD-10-CM | POA: Diagnosis not present

## 2018-03-25 DIAGNOSIS — E876 Hypokalemia: Secondary | ICD-10-CM

## 2018-03-25 DIAGNOSIS — I272 Pulmonary hypertension, unspecified: Secondary | ICD-10-CM | POA: Diagnosis not present

## 2018-03-25 DIAGNOSIS — J188 Other pneumonia, unspecified organism: Secondary | ICD-10-CM | POA: Diagnosis not present

## 2018-03-25 DIAGNOSIS — J440 Chronic obstructive pulmonary disease with acute lower respiratory infection: Secondary | ICD-10-CM | POA: Diagnosis not present

## 2018-03-25 DIAGNOSIS — I110 Hypertensive heart disease with heart failure: Secondary | ICD-10-CM | POA: Diagnosis not present

## 2018-03-25 DIAGNOSIS — I5033 Acute on chronic diastolic (congestive) heart failure: Secondary | ICD-10-CM | POA: Diagnosis not present

## 2018-03-25 DIAGNOSIS — E119 Type 2 diabetes mellitus without complications: Secondary | ICD-10-CM | POA: Diagnosis not present

## 2018-03-25 DIAGNOSIS — J441 Chronic obstructive pulmonary disease with (acute) exacerbation: Secondary | ICD-10-CM | POA: Diagnosis not present

## 2018-03-25 NOTE — Telephone Encounter (Signed)
Second attempt to contact pt regarding TCM

## 2018-03-25 NOTE — Telephone Encounter (Signed)
Copied from [place] [id number]. Topic: Conservator, museum/gallery Patient (Clinic Use ONLY) >> Mar 22, 2018  2:01 PM Gerilyn Nestle, RN wrote: Reason for CRM: LM requesting call back to complete TCM and schedule hospital follow up. When pt returns call, please warm transfer to [facility] @ [phone] or to [facility] at [facility].

## 2018-03-26 DIAGNOSIS — I2510 Atherosclerotic heart disease of native coronary artery without angina pectoris: Secondary | ICD-10-CM | POA: Diagnosis not present

## 2018-03-26 DIAGNOSIS — I110 Hypertensive heart disease with heart failure: Secondary | ICD-10-CM | POA: Diagnosis not present

## 2018-03-26 DIAGNOSIS — J188 Other pneumonia, unspecified organism: Secondary | ICD-10-CM | POA: Diagnosis not present

## 2018-03-26 DIAGNOSIS — E119 Type 2 diabetes mellitus without complications: Secondary | ICD-10-CM | POA: Diagnosis not present

## 2018-03-26 DIAGNOSIS — J9621 Acute and chronic respiratory failure with hypoxia: Secondary | ICD-10-CM | POA: Diagnosis not present

## 2018-03-26 DIAGNOSIS — I5033 Acute on chronic diastolic (congestive) heart failure: Secondary | ICD-10-CM | POA: Diagnosis not present

## 2018-03-26 DIAGNOSIS — J440 Chronic obstructive pulmonary disease with acute lower respiratory infection: Secondary | ICD-10-CM | POA: Diagnosis not present

## 2018-03-26 DIAGNOSIS — I272 Pulmonary hypertension, unspecified: Secondary | ICD-10-CM | POA: Diagnosis not present

## 2018-03-26 DIAGNOSIS — J441 Chronic obstructive pulmonary disease with (acute) exacerbation: Secondary | ICD-10-CM | POA: Diagnosis not present

## 2018-03-27 ENCOUNTER — Telehealth: Payer: Self-pay | Admitting: *Deleted

## 2018-03-27 ENCOUNTER — Inpatient Hospital Stay (HOSPITAL_COMMUNITY): Payer: Medicare HMO | Admitting: Cardiology

## 2018-03-27 NOTE — Telephone Encounter (Signed)
Gave the approval for the verbal order as instructed.

## 2018-03-27 NOTE — Telephone Encounter (Signed)
Approved.  

## 2018-03-27 NOTE — Telephone Encounter (Signed)
Denise [name] PT with [facility] left a voicemail requesting orders for in home PT Denise [name] requested twice a week for 3 weeks and once a week for 3 weeks. Patient was discharged from the hospital for CHF. Denise [name] stated that this is a secure line and orders can be left on it.

## 2018-03-28 ENCOUNTER — Telehealth: Payer: Self-pay | Admitting: Adult Health

## 2018-03-28 ENCOUNTER — Encounter: Payer: Self-pay | Admitting: Adult Health

## 2018-03-28 ENCOUNTER — Ambulatory Visit (INDEPENDENT_AMBULATORY_CARE_PROVIDER_SITE_OTHER): Payer: Medicare HMO | Admitting: Adult Health

## 2018-03-28 ENCOUNTER — Telehealth: Payer: Self-pay | Admitting: Primary Care

## 2018-03-28 ENCOUNTER — Other Ambulatory Visit: Payer: Self-pay

## 2018-03-28 DIAGNOSIS — J9611 Chronic respiratory failure with hypoxia: Secondary | ICD-10-CM

## 2018-03-28 DIAGNOSIS — I5032 Chronic diastolic (congestive) heart failure: Secondary | ICD-10-CM | POA: Diagnosis not present

## 2018-03-28 DIAGNOSIS — J9621 Acute and chronic respiratory failure with hypoxia: Secondary | ICD-10-CM | POA: Diagnosis not present

## 2018-03-28 DIAGNOSIS — J441 Chronic obstructive pulmonary disease with (acute) exacerbation: Secondary | ICD-10-CM | POA: Diagnosis not present

## 2018-03-28 DIAGNOSIS — I2510 Atherosclerotic heart disease of native coronary artery without angina pectoris: Secondary | ICD-10-CM | POA: Diagnosis not present

## 2018-03-28 DIAGNOSIS — J449 Chronic obstructive pulmonary disease, unspecified: Secondary | ICD-10-CM

## 2018-03-28 DIAGNOSIS — E119 Type 2 diabetes mellitus without complications: Secondary | ICD-10-CM | POA: Diagnosis not present

## 2018-03-28 DIAGNOSIS — I5033 Acute on chronic diastolic (congestive) heart failure: Secondary | ICD-10-CM | POA: Diagnosis not present

## 2018-03-28 DIAGNOSIS — I110 Hypertensive heart disease with heart failure: Secondary | ICD-10-CM | POA: Diagnosis not present

## 2018-03-28 DIAGNOSIS — I272 Pulmonary hypertension, unspecified: Secondary | ICD-10-CM

## 2018-03-28 DIAGNOSIS — J188 Other pneumonia, unspecified organism: Secondary | ICD-10-CM | POA: Diagnosis not present

## 2018-03-28 DIAGNOSIS — J189 Pneumonia, unspecified organism: Secondary | ICD-10-CM | POA: Diagnosis not present

## 2018-03-28 DIAGNOSIS — J440 Chronic obstructive pulmonary disease with acute lower respiratory infection: Secondary | ICD-10-CM | POA: Diagnosis not present

## 2018-03-28 NOTE — Telephone Encounter (Signed)
Patient had a telephone visit with TP. Will close encounter.

## 2018-03-28 NOTE — Addendum Note (Signed)
Addended by: Jannette Spanner on: 03/28/2018 11:37 AM   Modules accepted: Orders

## 2018-03-28 NOTE — Progress Notes (Signed)
Chart and office note reviewed in detail  > agree with a/p as outlined

## 2018-03-28 NOTE — Telephone Encounter (Signed)
Noted. Signed. Okay for end date to be 6 months. Placed on [employer].

## 2018-03-28 NOTE — Progress Notes (Signed)
Virtual Visit via Telephone Note  I connected with Denise [name] on 03/28/18 at 10:30 AM EDT by telephone and verified that I am speaking with the correct person using two identifiers.   I discussed the limitations, risks, security and privacy concerns of performing an evaluation and management service by telephone and the availability of in person appointments. I also discussed with the patient that there may be a patient responsible charge related to this service. The patient expressed understanding and agreed to proceed.   History of Present Illness: 73 year old female former smoker (1989) followed for gold 2 COPD and cor pulmonale pulmonary hypertension and oxygen dependent respiratory failure Today's visit is a tele-visit for follow-up of COPD.  And recent hospitalization.   Patient was admitted to the hospital last week for community-acquired pneumonia with a lingular opacity.  Decompensated diastolic heart failure and an acute COPD exacerbation. He was treated with IV antibiotics nebulized bronchodilators and IV steroids.  She did require aggressive diuresis.  2D echo showed a EF of 45 to 50%.  Asymmetric septal hypertrophy, diffuse hypokinesis yes with restrictive diastolic dysfunction.  She was seen by cardiology.  She underwent a heart catheterization that showed moderate pulmonary hypertension she was discharged on Lasix and spironolactone.  She also was ordered a myeloma panel -the immunofixation pattern was unremarkable.  There was no evidence of monoclonal protein.[name]  Her IgG was low at 374.  PYP was ordered but was not suggestive of amyloid. CT chest on 3/17  was done that showed moderate emphysema.  No evidence of interstitial lung disease.  No evidence of acute consolidation  Since discharge patient is feeling better. Cough is decreased . Dyspnea is unchanged. Feels the Oxygen is helping .  We reviewed her medications with the patient and her MAR was adjusted.  Her Lasix has  been increased to 40 mg.  She has finished her prednisone taper. Says legs is some better but remains swollen. She has a follow up with Cardiology  She remains on Spiriva and BREO . No increased wheezing.  Has PT coming at home. Discussed COVID precautions.   Observations/Objective: Spirometry 09/11/2016  FEV1 1.03 (52%)  Ratio 54 mild curvature, on no rx - 09/11/2016  After extensive coaching HFA effectiveness =    50% from baseline 10% > continue  symb 160 2bid but consider change to bevespi or stiolto  - 10/02/2017  After extensive coaching inhaler device,  effectiveness =    50% from a baseline near 0 > rechallenge with symbicort 160 x 2 bid x 2 weeks then re-group  - 10/05/2017    try stiolto x 2pffs x 2 week sample  - PFT's  11/01/2017  FEV1 0.95 (52 % ) ratio fev1/vc = 69%  p 3 % improvement from saba p ? Stiolto(not sure she used it)  prior to study with DLCO  30/30c % corrects to 59 % for alv volume  With air trapping pattern  confirmed on cxr as well   Assessment and Plan: 1. COPD /Emphysema -continue on Breo and Spiriva.   2. D CHF -recent decompensation.  Appears to be improving after recent hospitalization with aggressive diuresis. Patient is continue on furosemide and spironolactone.  She is to follow-up with cardiology as discussed.  Continue with daily weights.  On return we will get lab work if not done at cardiology  3. Pulmonary HTN -has follow-up planned with cardiology. Continue on oxygen and diuretics.  4. O2 RF  Continue on oxygen.  5  community-acquired pneumonia.  Treated with antibiotics.. CT chest showed no acute consolidation.  No further antibiotics at this time.   Follow Up Instructions: Elevate legs as much as possible .  Low salt diet .  Continue on Lasix and Spironolactone .  Call Cardiology today for follow up .  Continue on Spiriva and BREO .  Continue on Oxygen 2l/m .  Follow up with Dr. Melvyn Novas  In 6 weeks and As needed   Please contact office for  sooner follow up if symptoms do not improve or worsen or seek emergency care      I discussed the assessment and treatment plan with the patient. The patient was provided an opportunity to ask questions and all were answered. The patient agreed with the plan and demonstrated an understanding of the instructions.   The patient was advised to call back or seek an in-person evaluation if the symptoms worsen or if the condition fails to improve as anticipated.  I provided 22 minutes of non-face-to-face time during this encounter. Call ended at 11:07 am  Present for visit today is patient home , along with husband and sister on the phone . Myself at office .     Rexene Edison, NP

## 2018-03-28 NOTE — Telephone Encounter (Signed)
Paperwork faxed °

## 2018-03-28 NOTE — Telephone Encounter (Signed)
FMLA paperwork in Denise [name]'s in box  Spoke with spouse he stated he has been home since 03/15/2018 and would like end date to be 6 month from this date

## 2018-03-28 NOTE — Patient Instructions (Addendum)
Elevate legs as much as possible .  Low salt diet .  Continue on Lasix and Spironolactone .  Call Cardiology today for follow up .  Continue on Spiriva and BREO .  Continue on Oxygen 2l/m .  Follow up with Dr. Melvyn Novas  In 6 weeks and As needed   Please contact office for sooner follow up if symptoms do not improve or worsen or seek emergency care

## 2018-03-29 ENCOUNTER — Inpatient Hospital Stay: Payer: Medicare HMO | Admitting: Primary Care

## 2018-03-29 DIAGNOSIS — J9611 Chronic respiratory failure with hypoxia: Secondary | ICD-10-CM | POA: Diagnosis not present

## 2018-04-01 ENCOUNTER — Telehealth: Payer: Self-pay | Admitting: Primary Care

## 2018-04-01 DIAGNOSIS — I272 Pulmonary hypertension, unspecified: Secondary | ICD-10-CM | POA: Diagnosis not present

## 2018-04-01 DIAGNOSIS — I5033 Acute on chronic diastolic (congestive) heart failure: Secondary | ICD-10-CM | POA: Diagnosis not present

## 2018-04-01 DIAGNOSIS — I110 Hypertensive heart disease with heart failure: Secondary | ICD-10-CM | POA: Diagnosis not present

## 2018-04-01 DIAGNOSIS — I2510 Atherosclerotic heart disease of native coronary artery without angina pectoris: Secondary | ICD-10-CM | POA: Diagnosis not present

## 2018-04-01 DIAGNOSIS — J188 Other pneumonia, unspecified organism: Secondary | ICD-10-CM | POA: Diagnosis not present

## 2018-04-01 DIAGNOSIS — J9621 Acute and chronic respiratory failure with hypoxia: Secondary | ICD-10-CM | POA: Diagnosis not present

## 2018-04-01 DIAGNOSIS — J440 Chronic obstructive pulmonary disease with acute lower respiratory infection: Secondary | ICD-10-CM | POA: Diagnosis not present

## 2018-04-01 DIAGNOSIS — J441 Chronic obstructive pulmonary disease with (acute) exacerbation: Secondary | ICD-10-CM | POA: Diagnosis not present

## 2018-04-01 DIAGNOSIS — E119 Type 2 diabetes mellitus without complications: Secondary | ICD-10-CM | POA: Diagnosis not present

## 2018-04-01 NOTE — Telephone Encounter (Signed)
Please get her scheduled for an office visit.

## 2018-04-01 NOTE — Telephone Encounter (Signed)
Best number [phone] Pt wanted to make an appointment she stated she is having diarrhea x 2 weeks she has been taking imodium for this and she has bump in back of vagina/itchy.  A few years ago she had colon cancer she wanted a referral to GI.  Her stools are black she doesn't see any blood.  No web capability.

## 2018-04-01 NOTE — Telephone Encounter (Signed)
Please advise.

## 2018-04-01 NOTE — Telephone Encounter (Signed)
Spoken to patient and schedule appt on 04/02/2018

## 2018-04-01 NOTE — Telephone Encounter (Signed)
Paperwork faxed Copy for pt per request paperwork mailed Copy for scan

## 2018-04-02 ENCOUNTER — Other Ambulatory Visit: Payer: Self-pay

## 2018-04-02 ENCOUNTER — Ambulatory Visit (INDEPENDENT_AMBULATORY_CARE_PROVIDER_SITE_OTHER): Payer: Medicare HMO | Admitting: Primary Care

## 2018-04-02 VITALS — BP 120/84 | HR 86 | Temp 98.1°F | Ht 67.0 in | Wt 120.0 lb

## 2018-04-02 DIAGNOSIS — R197 Diarrhea, unspecified: Secondary | ICD-10-CM | POA: Diagnosis not present

## 2018-04-02 DIAGNOSIS — I272 Pulmonary hypertension, unspecified: Secondary | ICD-10-CM | POA: Diagnosis not present

## 2018-04-02 DIAGNOSIS — J9611 Chronic respiratory failure with hypoxia: Secondary | ICD-10-CM

## 2018-04-02 DIAGNOSIS — J441 Chronic obstructive pulmonary disease with (acute) exacerbation: Secondary | ICD-10-CM | POA: Diagnosis not present

## 2018-04-02 DIAGNOSIS — Z85038 Personal history of other malignant neoplasm of large intestine: Secondary | ICD-10-CM

## 2018-04-02 DIAGNOSIS — I110 Hypertensive heart disease with heart failure: Secondary | ICD-10-CM | POA: Diagnosis not present

## 2018-04-02 DIAGNOSIS — I5033 Acute on chronic diastolic (congestive) heart failure: Secondary | ICD-10-CM | POA: Diagnosis not present

## 2018-04-02 DIAGNOSIS — J188 Other pneumonia, unspecified organism: Secondary | ICD-10-CM | POA: Diagnosis not present

## 2018-04-02 DIAGNOSIS — J9621 Acute and chronic respiratory failure with hypoxia: Secondary | ICD-10-CM | POA: Diagnosis not present

## 2018-04-02 DIAGNOSIS — I2510 Atherosclerotic heart disease of native coronary artery without angina pectoris: Secondary | ICD-10-CM | POA: Diagnosis not present

## 2018-04-02 DIAGNOSIS — E119 Type 2 diabetes mellitus without complications: Secondary | ICD-10-CM | POA: Diagnosis not present

## 2018-04-02 DIAGNOSIS — Z86010 Personal history of colonic polyps: Secondary | ICD-10-CM

## 2018-04-02 DIAGNOSIS — I10 Essential (primary) hypertension: Secondary | ICD-10-CM | POA: Diagnosis not present

## 2018-04-02 DIAGNOSIS — J440 Chronic obstructive pulmonary disease with acute lower respiratory infection: Secondary | ICD-10-CM | POA: Diagnosis not present

## 2018-04-02 HISTORY — DX: Diarrhea, unspecified: R19.7

## 2018-04-02 NOTE — Assessment & Plan Note (Signed)
Following with pulmonology, recent hospital visit. Appears stable on exam today, no abnormal breath sounds.  Continue home oxygen and current regimen per pulmonology.

## 2018-04-02 NOTE — Progress Notes (Signed)
Subjective:    Patient ID: Denise [name], female    DOB: 03-Jan-1945, 73 y.o.   MRN: [id number]  HPI  [name] is a 73 year old female with a history of pulmonary hypertension, COPD, chronic respiratory failure, hyperparathyroidism, colon cancer who presents today with a chief complaint of diarrhea.  Her diarrhea has been present for the last two weeks with very soft/slightly formed stools. She would experience episodes 3-4 times daily. Stool has also had a darker color for the last several weeks. Her diarrhea stopped several days ago after taking Imodium for which she continues to take daily, stools are also lighter in color. She was hospitalized at [facility] for acute on chronic respiratory failure, bilateral lower extremity edema, CHF and was on Omnicef and Azithromycin, diarrhea occurred prior to her hospital stay.   She's noticed once bump around the gluteal cleft several days ago which has since reduced in size. She continues to experience shortness of breath which is chronic for her, no worse. She is due for repeat colonoscopy but this was held last year due to her diagnosis of hyperparathyroidism.   She has been taking Imodium once daily for the last one week. She is eating and taking in liquids without difficulty. She denies fevers, abdominal pain, nausea/vomiting. She's been checking her glucose every morning when waking and is getting readings ranging from 89-140 on average.  She has not yet started weighing herself daily.  Denies increased lower extremity edema.  Review of Systems  Constitutional: Negative for fever.  Cardiovascular: Negative for chest pain and leg swelling.  Gastrointestinal: Negative for abdominal pain, blood in stool, diarrhea, nausea and vomiting.       Past Medical History:  Diagnosis Date  . Altered mental status   . Anxiety and depression   . Colon cancer ([place]) 1988   Resected  . COPD (chronic obstructive pulmonary disease) ([place])   .  Diabetes mellitus without complication ([name])    diet controlled- no meds. per pt  . Diverticulosis   . Hyperlipidemia   . Hypertension   . Insomnia   . Primary hyperparathyroidism ([place])   . Tubular adenoma of rectum 02/23/2013   low grade  . Vitamin D deficiency      Social History   Socioeconomic History  . Marital status: Married    Spouse name: Not on file  . Number of children: 3  . Years of education: Not on file  . Highest education level: Not on file  Occupational History  . Not on file  Social Needs  . Financial resource strain: Not on file  . Food insecurity:    Worry: Not on file    Inability: Not on file  . Transportation needs:    Medical: Not on file    Non-medical: Not on file  Tobacco Use  . Smoking status: Former Smoker    Packs/day: 1.00    Years: 30.00    Pack years: 30.00    Types: Cigarettes    Last attempt to quit: 1989    Years since quitting: 31.2  . Smokeless tobacco: Never Used  Substance and Sexual Activity  . Alcohol use: No  . Drug use: No  . Sexual activity: Yes    Partners: Male    Birth control/protection: Post-menopausal, Surgical  Lifestyle  . Physical activity:    Days per week: Not on file    Minutes per session: Not on file  . Stress: Not on file  Relationships  .  Social connections:    Talks on phone: Not on file    Gets together: Not on file    Attends religious service: Not on file    Active member of club or organization: Not on file    Attends meetings of clubs or organizations: Not on file    Relationship status: Not on file  . Intimate partner violence:    Fear of current or ex partner: Not on file    Emotionally abused: Not on file    Physically abused: Not on file    Forced sexual activity: Not on file  Other Topics Concern  . Not on file  Social History Narrative   Married. 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 ABDOMINAL HYSTERECTOMY  08/2015  . BREAST BIOPSY Left   . BREAST EXCISIONAL BIOPSY Right   . COLON RESECTION  1980's  . COLONOSCOPY    . ENDOMETRIAL BIOPSY  2009   negative  . INCONTINENCE SURGERY  2017  . RIGHT HEART CATH N/A 09/20/2017   Procedure: RIGHT HEART CATH;  Surgeon: Larey Dresser, MD;  Location: [place] [facility];  Service: Cardiovascular;  Laterality: N/A;  . RIGHT/LEFT HEART CATH AND CORONARY ANGIOGRAPHY N/A 03/19/2018   Procedure: RIGHT/LEFT HEART CATH AND CORONARY ANGIOGRAPHY;  Surgeon: Larey Dresser, MD;  Location: [place] [facility];  Service: Cardiovascular;  Laterality: N/A;    Family History  Problem Relation Age of Onset  . Ovarian cancer Mother   . Breast cancer Neg Hx   . Hyperparathyroidism Neg Hx   . Colon cancer Neg Hx   . Esophageal cancer Neg Hx   . Stomach cancer Neg Hx     No Known Allergies  Current Outpatient Medications on File Prior to Visit  Medication Sig Dispense Refill  . albuterol (PROVENTIL HFA;VENTOLIN HFA) 108 (90 Base) MCG/ACT inhaler INHALE 1-2 PUFFS INTO THE LUNGS EVERY 4 HOURS AS NEEDED FOR WHEEZING OR SHORTNESS OF BREATH. (Patient taking differently: Inhale 1-2 puffs into the lungs every 4 (four) hours as needed for wheezing or shortness of breath. ) 6.7 Inhaler 0  . aspirin EC 81 MG EC tablet Take 1 tablet (81 mg total) by mouth daily. 30 tablet 0  . carvedilol (COREG) 6.25 MG tablet Take 1 tablet (6.25 mg total) by mouth 2 (two) times daily with a meal. 60 tablet 0  . cinacalcet (SENSIPAR) 30 MG tablet Take 1 tablet (30 mg total) by mouth every Monday, Wednesday, and Friday. 30 tablet 0  . famotidine (PEPCID) 40 MG tablet TAKE 1 TABLET BY MOUTH TWICE A DAY (Patient taking differently: Take 40 mg by mouth daily. ) 180 tablet 0  . feeding supplement, ENSURE ENLIVE, (ENSURE ENLIVE) LIQD Take 237 mLs by mouth 2 (two) times daily between meals. 60 Bottle 0  . fluticasone  furoate-vilanterol (BREO ELLIPTA) 100-25 MCG/INH AEPB Inhale 1 puff into the lungs daily. 1 each 0  . furosemide (LASIX) 20 MG tablet Take 20 mg by mouth.    [name] glipiZIDE (GLUCOTROL) 5 MG tablet Take 0.5 tablets (2.5 mg total) by mouth daily before breakfast. 60 tablet 0  . isosorbide-hydrALAZINE (BIDIL) 20-37.5 MG tablet Take 1 tablet by mouth 3 (three) times daily. 90 tablet 0  . losartan (COZAAR) 100 MG tablet Take 1 tablet by mouth  once daily for blood pressure. (Patient taking differently: Take 100 mg by mouth daily. for blood pressure.) 90 tablet 0  . mirtazapine (REMERON) 15 MG tablet TAKE 1 TABLET BY MOUTH AT BEDTIME. FOR DEPRESSION AND APPETITE. (Patient taking differently: Take 15 mg by mouth at bedtime. For depression and appetite) 90 tablet 0  . Multiple Vitamin (MULTIVITAMIN WITH MINERALS) TABS tablet Take 1 tablet by mouth daily.    [name] omeprazole (PRILOSEC) 20 MG capsule Take 20 mg by mouth daily.    . OXYGEN Inhale 2 L into the lungs continuous.     . potassium chloride SA (K-DUR,KLOR-CON) 20 MEQ tablet Take 1 tablet (20 mEq total) by mouth 2 (two) times daily. For low potassium. (Patient taking differently: Take 20 mEq by mouth daily. For low potassium.) 10 tablet 0  . predniSONE (DELTASONE) 10 MG tablet Take 10 mg by mouth daily with breakfast.    . spironolactone (ALDACTONE) 25 MG tablet Take 0.5 tablets (12.5 mg total) by mouth daily. 30 tablet 0  . Tiotropium Bromide-Olodaterol (STIOLTO RESPIMAT) 2.5-2.5 MCG/ACT AERS Inhale 2 puffs into the lungs daily. 1 Inhaler 0   No current facility-administered medications on file prior to visit.     BP 120/84   Pulse 86   Temp 98.1 F (36.7 C) (Oral)   Ht _0  (1.702 m)   Wt 120 lb (54.4 kg)   SpO2 91%   BMI 18.79 kg/m    Objective:   Physical Exam  Constitutional: She is oriented to person, place, and time. She appears well-nourished.  Cardiovascular: Normal rate and regular rhythm.  Respiratory: Effort normal and breath  sounds normal. She has no wheezes.  GI: Soft. Bowel sounds are normal. There is no abdominal tenderness.  Genitourinary: Rectum:     Guaiac result negative.     No tenderness, external hemorrhoid or internal hemorrhoid.   Neurological: She is alert and oriented to person, place, and time.  Skin: Skin is warm and dry.           Assessment & Plan:

## 2018-04-02 NOTE — Assessment & Plan Note (Signed)
Acute for 2 weeks, seems to have resolved completely now.  She appears well, exam benign.  We will have her stop Imodium and report if diarrhea returns.  If this is the case then we will complete a stool study to rule out C. difficile and or other organisms.  CBC with differential and BMP pending today.

## 2018-04-02 NOTE — Patient Instructions (Signed)
Stop by the lab prior to leaving today. I will notify you of your results once received.   You will be contacted regarding your referral to GI for the colonoscopy.  Please let us know if you have not been contacted within one week.   Remember to weigh yourself every morning with the same clothes. Call the heart doctor if you see a weight gain of greater than 2 pounds in 24 hours or greater than 5 pounds in one week.  Stop taking Imodium. Let me know if your diarrhea returns.  It was a pleasure to see you today!

## 2018-04-02 NOTE — Assessment & Plan Note (Signed)
Referral placed for repeat colonoscopy per GI.

## 2018-04-02 NOTE — Assessment & Plan Note (Signed)
Stable in the office today.  Continue current regimen.

## 2018-04-03 DIAGNOSIS — I110 Hypertensive heart disease with heart failure: Secondary | ICD-10-CM | POA: Diagnosis not present

## 2018-04-03 DIAGNOSIS — J441 Chronic obstructive pulmonary disease with (acute) exacerbation: Secondary | ICD-10-CM | POA: Diagnosis not present

## 2018-04-03 DIAGNOSIS — I5033 Acute on chronic diastolic (congestive) heart failure: Secondary | ICD-10-CM | POA: Diagnosis not present

## 2018-04-03 DIAGNOSIS — J440 Chronic obstructive pulmonary disease with acute lower respiratory infection: Secondary | ICD-10-CM | POA: Diagnosis not present

## 2018-04-03 DIAGNOSIS — J9621 Acute and chronic respiratory failure with hypoxia: Secondary | ICD-10-CM | POA: Diagnosis not present

## 2018-04-03 DIAGNOSIS — I272 Pulmonary hypertension, unspecified: Secondary | ICD-10-CM | POA: Diagnosis not present

## 2018-04-03 DIAGNOSIS — J188 Other pneumonia, unspecified organism: Secondary | ICD-10-CM | POA: Diagnosis not present

## 2018-04-03 DIAGNOSIS — E119 Type 2 diabetes mellitus without complications: Secondary | ICD-10-CM | POA: Diagnosis not present

## 2018-04-03 DIAGNOSIS — I2510 Atherosclerotic heart disease of native coronary artery without angina pectoris: Secondary | ICD-10-CM | POA: Diagnosis not present

## 2018-04-03 LAB — BASIC METABOLIC PANEL
BUN: 19 mg/dL (ref 6–23)
CO2: 29 mEq/L (ref 19–32)
Calcium: 10.3 mg/dL (ref 8.4–10.5)
Chloride: 104 mEq/L (ref 96–112)
Creatinine, Ser: 0.98 mg/dL (ref 0.40–1.20)
GFR: 67.35 mL/min (ref >60.00)
Glucose, Bld: 164 mg/dL — ABNORMAL HIGH (ref 70–99)
POTASSIUM: 3.8 meq/L (ref 3.5–5.1)
Sodium: 142 mEq/L (ref 135–145)

## 2018-04-03 LAB — CBC WITH DIFFERENTIAL/PLATELET
Basophils Absolute: 0.1 10*3/uL (ref 0.0–0.1)
Basophils Relative: 1.7 % (ref 0.0–3.0)
EOS ABS: 0 10*3/uL (ref 0.0–0.7)
Eosinophils Relative: 0.5 % (ref 0.0–5.0)
HCT: 41.5 % (ref 36.0–46.0)
Hemoglobin: 13.8 g/dL (ref 12.0–15.0)
Lymphocytes Relative: 14.2 % (ref 12.0–46.0)
Lymphs Abs: 0.9 10*3/uL (ref 0.7–4.0)
MCHC: 33.4 g/dL (ref 30.0–36.0)
MCV: 85.1 fl (ref 78.0–100.0)
MONO ABS: 0.3 10*3/uL (ref 0.1–1.0)
Monocytes Relative: 5.2 % (ref 3.0–12.0)
Neutro Abs: 5.1 10*3/uL (ref 1.4–7.7)
Neutrophils Relative %: 78.4 % — ABNORMAL HIGH (ref 43.0–77.0)
Platelets: 254 10*3/uL (ref 150.0–400.0)
RBC: 4.87 Mil/uL (ref 3.87–5.11)
RDW: 15.9 % — ABNORMAL HIGH (ref 11.5–15.5)
WBC: 6.5 10*3/uL (ref 4.0–10.5)

## 2018-04-04 ENCOUNTER — Other Ambulatory Visit: Payer: Self-pay | Admitting: Primary Care

## 2018-04-04 DIAGNOSIS — J449 Chronic obstructive pulmonary disease, unspecified: Secondary | ICD-10-CM

## 2018-04-04 NOTE — Telephone Encounter (Signed)
Per PCP to sent request to Pulmonology

## 2018-04-05 ENCOUNTER — Telehealth: Payer: Self-pay | Admitting: Primary Care

## 2018-04-05 NOTE — Telephone Encounter (Signed)
Called to schedule a follow up in mid June. Left voicemail asking patient to call office.

## 2018-04-08 ENCOUNTER — Telehealth: Payer: Self-pay

## 2018-04-08 DIAGNOSIS — I2510 Atherosclerotic heart disease of native coronary artery without angina pectoris: Secondary | ICD-10-CM | POA: Diagnosis not present

## 2018-04-08 DIAGNOSIS — J441 Chronic obstructive pulmonary disease with (acute) exacerbation: Secondary | ICD-10-CM | POA: Diagnosis not present

## 2018-04-08 DIAGNOSIS — I5033 Acute on chronic diastolic (congestive) heart failure: Secondary | ICD-10-CM | POA: Diagnosis not present

## 2018-04-08 DIAGNOSIS — I110 Hypertensive heart disease with heart failure: Secondary | ICD-10-CM | POA: Diagnosis not present

## 2018-04-08 DIAGNOSIS — E119 Type 2 diabetes mellitus without complications: Secondary | ICD-10-CM | POA: Diagnosis not present

## 2018-04-08 DIAGNOSIS — J440 Chronic obstructive pulmonary disease with acute lower respiratory infection: Secondary | ICD-10-CM | POA: Diagnosis not present

## 2018-04-08 DIAGNOSIS — J188 Other pneumonia, unspecified organism: Secondary | ICD-10-CM | POA: Diagnosis not present

## 2018-04-08 DIAGNOSIS — I272 Pulmonary hypertension, unspecified: Secondary | ICD-10-CM | POA: Diagnosis not present

## 2018-04-08 DIAGNOSIS — J9621 Acute and chronic respiratory failure with hypoxia: Secondary | ICD-10-CM | POA: Diagnosis not present

## 2018-04-08 NOTE — Telephone Encounter (Signed)
I left a message for the patient to return my call about appointment.

## 2018-04-09 DIAGNOSIS — I110 Hypertensive heart disease with heart failure: Secondary | ICD-10-CM | POA: Diagnosis not present

## 2018-04-09 DIAGNOSIS — J441 Chronic obstructive pulmonary disease with (acute) exacerbation: Secondary | ICD-10-CM | POA: Diagnosis not present

## 2018-04-09 DIAGNOSIS — J9621 Acute and chronic respiratory failure with hypoxia: Secondary | ICD-10-CM | POA: Diagnosis not present

## 2018-04-09 DIAGNOSIS — J440 Chronic obstructive pulmonary disease with acute lower respiratory infection: Secondary | ICD-10-CM | POA: Diagnosis not present

## 2018-04-09 DIAGNOSIS — I5033 Acute on chronic diastolic (congestive) heart failure: Secondary | ICD-10-CM | POA: Diagnosis not present

## 2018-04-09 DIAGNOSIS — E119 Type 2 diabetes mellitus without complications: Secondary | ICD-10-CM | POA: Diagnosis not present

## 2018-04-09 DIAGNOSIS — I272 Pulmonary hypertension, unspecified: Secondary | ICD-10-CM | POA: Diagnosis not present

## 2018-04-09 DIAGNOSIS — I2510 Atherosclerotic heart disease of native coronary artery without angina pectoris: Secondary | ICD-10-CM | POA: Diagnosis not present

## 2018-04-09 DIAGNOSIS — J188 Other pneumonia, unspecified organism: Secondary | ICD-10-CM | POA: Diagnosis not present

## 2018-04-09 NOTE — Telephone Encounter (Signed)
Follow Up: ° ° ° °Returning your call from yesterday. °

## 2018-04-09 NOTE — Telephone Encounter (Signed)
Follow Up:     Returning your call, he said the other phone number is not working good.

## 2018-04-09 NOTE — Telephone Encounter (Signed)
I left a message for the patient to return my call.

## 2018-04-10 DIAGNOSIS — J9621 Acute and chronic respiratory failure with hypoxia: Secondary | ICD-10-CM | POA: Diagnosis not present

## 2018-04-10 DIAGNOSIS — J188 Other pneumonia, unspecified organism: Secondary | ICD-10-CM | POA: Diagnosis not present

## 2018-04-10 DIAGNOSIS — E119 Type 2 diabetes mellitus without complications: Secondary | ICD-10-CM | POA: Diagnosis not present

## 2018-04-10 DIAGNOSIS — I5033 Acute on chronic diastolic (congestive) heart failure: Secondary | ICD-10-CM | POA: Diagnosis not present

## 2018-04-10 DIAGNOSIS — I2510 Atherosclerotic heart disease of native coronary artery without angina pectoris: Secondary | ICD-10-CM | POA: Diagnosis not present

## 2018-04-10 DIAGNOSIS — J441 Chronic obstructive pulmonary disease with (acute) exacerbation: Secondary | ICD-10-CM | POA: Diagnosis not present

## 2018-04-10 DIAGNOSIS — J440 Chronic obstructive pulmonary disease with acute lower respiratory infection: Secondary | ICD-10-CM | POA: Diagnosis not present

## 2018-04-10 DIAGNOSIS — I272 Pulmonary hypertension, unspecified: Secondary | ICD-10-CM | POA: Diagnosis not present

## 2018-04-10 DIAGNOSIS — I110 Hypertensive heart disease with heart failure: Secondary | ICD-10-CM | POA: Diagnosis not present

## 2018-04-15 DIAGNOSIS — J441 Chronic obstructive pulmonary disease with (acute) exacerbation: Secondary | ICD-10-CM | POA: Diagnosis not present

## 2018-04-15 DIAGNOSIS — J440 Chronic obstructive pulmonary disease with acute lower respiratory infection: Secondary | ICD-10-CM | POA: Diagnosis not present

## 2018-04-15 DIAGNOSIS — I5033 Acute on chronic diastolic (congestive) heart failure: Secondary | ICD-10-CM | POA: Diagnosis not present

## 2018-04-15 DIAGNOSIS — I2510 Atherosclerotic heart disease of native coronary artery without angina pectoris: Secondary | ICD-10-CM | POA: Diagnosis not present

## 2018-04-15 DIAGNOSIS — I110 Hypertensive heart disease with heart failure: Secondary | ICD-10-CM | POA: Diagnosis not present

## 2018-04-15 DIAGNOSIS — J188 Other pneumonia, unspecified organism: Secondary | ICD-10-CM | POA: Diagnosis not present

## 2018-04-15 DIAGNOSIS — E119 Type 2 diabetes mellitus without complications: Secondary | ICD-10-CM | POA: Diagnosis not present

## 2018-04-15 DIAGNOSIS — J9621 Acute and chronic respiratory failure with hypoxia: Secondary | ICD-10-CM | POA: Diagnosis not present

## 2018-04-15 DIAGNOSIS — I272 Pulmonary hypertension, unspecified: Secondary | ICD-10-CM | POA: Diagnosis not present

## 2018-04-16 ENCOUNTER — Telehealth: Payer: Self-pay

## 2018-04-16 DIAGNOSIS — I2510 Atherosclerotic heart disease of native coronary artery without angina pectoris: Secondary | ICD-10-CM | POA: Diagnosis not present

## 2018-04-16 DIAGNOSIS — J9621 Acute and chronic respiratory failure with hypoxia: Secondary | ICD-10-CM | POA: Diagnosis not present

## 2018-04-16 DIAGNOSIS — I5033 Acute on chronic diastolic (congestive) heart failure: Secondary | ICD-10-CM | POA: Diagnosis not present

## 2018-04-16 DIAGNOSIS — E119 Type 2 diabetes mellitus without complications: Secondary | ICD-10-CM | POA: Diagnosis not present

## 2018-04-16 DIAGNOSIS — I272 Pulmonary hypertension, unspecified: Secondary | ICD-10-CM | POA: Diagnosis not present

## 2018-04-16 DIAGNOSIS — I110 Hypertensive heart disease with heart failure: Secondary | ICD-10-CM | POA: Diagnosis not present

## 2018-04-16 DIAGNOSIS — J441 Chronic obstructive pulmonary disease with (acute) exacerbation: Secondary | ICD-10-CM | POA: Diagnosis not present

## 2018-04-16 DIAGNOSIS — J188 Other pneumonia, unspecified organism: Secondary | ICD-10-CM | POA: Diagnosis not present

## 2018-04-16 DIAGNOSIS — J440 Chronic obstructive pulmonary disease with acute lower respiratory infection: Secondary | ICD-10-CM | POA: Diagnosis not present

## 2018-04-16 NOTE — Telephone Encounter (Signed)
Claiborne Billings nurse with [facility] left v/m; pt has been losing wt; on 04/03/18 pts wt was 116 lbs and 04/16/18 pts wt is 105.4 lbs. Pt has phone visit with thyroid dr on 04/19/18. Also Claiborne Billings noticed an irregular heart beat and Claiborne Billings wants to know if this is normal for pt; pt does not have CP, dizziness or weakness. Pt last seen 04/02/18. Kelly request cb.

## 2018-04-16 NOTE — Telephone Encounter (Signed)
Please notify nurse that she should most certainly mention the weight loss to her endocrinologist this week. She is managed on furosemide now for CHF and did once have a moderate amount of edema to her lower extremities from heart failure. This could be part of the cause.  I don't see where she has a diagnosis of an arhythmia within her chart. Labs 2 weeks ago were grossly normal. She did see cardiology at one point. Have her update [place] from her visit later this week with her endocrinologist.   Also, is she eating and drinking? Any confusion or change in mental status?

## 2018-04-17 NOTE — Telephone Encounter (Signed)
Spoken and notified Claiborne Billings of [place] comments. Claiborne Billings stated that she will let patient know and to give [place] a call on Friday. Claiborne Billings stated that patient and husband stated she is eat and drinking. Patient look good beside being SOB. Did not noticed any confusion or change in mental status.

## 2018-04-17 NOTE — Telephone Encounter (Signed)
Noted.

## 2018-04-19 ENCOUNTER — Ambulatory Visit: Payer: Medicare HMO | Admitting: Endocrinology

## 2018-04-19 ENCOUNTER — Other Ambulatory Visit: Payer: Self-pay | Admitting: Primary Care

## 2018-04-19 ENCOUNTER — Telehealth: Payer: Self-pay | Admitting: Endocrinology

## 2018-04-19 DIAGNOSIS — I10 Essential (primary) hypertension: Secondary | ICD-10-CM

## 2018-04-19 NOTE — Telephone Encounter (Signed)
I think she means cinacalcet.  Can you verify to make sure?

## 2018-04-19 NOTE — Telephone Encounter (Signed)
Please advise. I am not seeing any recent refills completed by you

## 2018-04-19 NOTE — Telephone Encounter (Signed)
Called pt to clarify. States she does not remember. States you mentioned "adding" a thyroid medication but it just dawned on her that she never got it. Please advise.

## 2018-04-19 NOTE — Telephone Encounter (Signed)
I am unaware of the need for a thyroid pill.

## 2018-04-19 NOTE — Telephone Encounter (Signed)
Patient has called in regards to seeing if Dr.Ellison ordered her thyroid medicine. The patient was unable to provide me the name of the RX, just states Thyroid medicine.  Please Advise, Thanks

## 2018-04-22 DIAGNOSIS — I110 Hypertensive heart disease with heart failure: Secondary | ICD-10-CM | POA: Diagnosis not present

## 2018-04-22 DIAGNOSIS — I5033 Acute on chronic diastolic (congestive) heart failure: Secondary | ICD-10-CM | POA: Diagnosis not present

## 2018-04-22 DIAGNOSIS — J188 Other pneumonia, unspecified organism: Secondary | ICD-10-CM | POA: Diagnosis not present

## 2018-04-22 DIAGNOSIS — J441 Chronic obstructive pulmonary disease with (acute) exacerbation: Secondary | ICD-10-CM | POA: Diagnosis not present

## 2018-04-22 DIAGNOSIS — J9621 Acute and chronic respiratory failure with hypoxia: Secondary | ICD-10-CM | POA: Diagnosis not present

## 2018-04-22 DIAGNOSIS — I2510 Atherosclerotic heart disease of native coronary artery without angina pectoris: Secondary | ICD-10-CM | POA: Diagnosis not present

## 2018-04-22 DIAGNOSIS — J440 Chronic obstructive pulmonary disease with acute lower respiratory infection: Secondary | ICD-10-CM | POA: Diagnosis not present

## 2018-04-22 DIAGNOSIS — I272 Pulmonary hypertension, unspecified: Secondary | ICD-10-CM | POA: Diagnosis not present

## 2018-04-22 DIAGNOSIS — E119 Type 2 diabetes mellitus without complications: Secondary | ICD-10-CM | POA: Diagnosis not present

## 2018-04-22 NOTE — Telephone Encounter (Signed)
Called pt and made her aware of Dr. Cordelia Pen response. Advised, if still concerned, to re-address with Dr. Loanne Drilling during her follow up appt in May. Verbalized acceptance and understanding.

## 2018-04-24 ENCOUNTER — Other Ambulatory Visit: Payer: Self-pay | Admitting: Primary Care

## 2018-04-24 NOTE — Telephone Encounter (Signed)
These were last prescribed at the hospital. Last office visit on 04/02/2018. Next future appointment on 06/24/2018

## 2018-04-25 ENCOUNTER — Telehealth: Payer: Self-pay

## 2018-04-25 NOTE — Telephone Encounter (Signed)
Pt weighed 105 lbs last week; pt weighed early in AM and in PJs. This morning pt weighed 99 lbs. Pt has had loose stools last wk.pt said that is not why she is losing weight. Pt has some lower abd pain on and off and now pt feels like she has to strain to get harder stools out. Pt said she is not constipated. Pt is eating well, feels like appetite has increased.no fever or cough and SOB is the same as always; no travel and no known exposure to covid or flu. Pt does not have smart phone and scheduled a phone visit on 04/26/18 at 10:20 AM.

## 2018-04-25 NOTE — Telephone Encounter (Signed)
Spironolactone, isosorbide-hydralazine, and carvedilol go to cardiology. Memory Dance goes to pulmonology.  Is she able to complete a virtual visit for her diabetes? Can we do this on Friday?

## 2018-04-26 ENCOUNTER — Other Ambulatory Visit: Payer: Self-pay | Admitting: Primary Care

## 2018-04-26 ENCOUNTER — Ambulatory Visit (INDEPENDENT_AMBULATORY_CARE_PROVIDER_SITE_OTHER): Payer: Medicare HMO | Admitting: Primary Care

## 2018-04-26 ENCOUNTER — Other Ambulatory Visit: Payer: Self-pay

## 2018-04-26 ENCOUNTER — Telehealth: Payer: Self-pay | Admitting: Cardiovascular Disease

## 2018-04-26 ENCOUNTER — Telehealth: Payer: Self-pay | Admitting: Internal Medicine

## 2018-04-26 ENCOUNTER — Telehealth: Payer: Self-pay | Admitting: *Deleted

## 2018-04-26 DIAGNOSIS — J449 Chronic obstructive pulmonary disease, unspecified: Secondary | ICD-10-CM

## 2018-04-26 DIAGNOSIS — R634 Abnormal weight loss: Secondary | ICD-10-CM

## 2018-04-26 DIAGNOSIS — E119 Type 2 diabetes mellitus without complications: Secondary | ICD-10-CM

## 2018-04-26 HISTORY — DX: Abnormal weight loss: R63.4

## 2018-04-26 HISTORY — DX: Type 2 diabetes mellitus without complications: E11.9

## 2018-04-26 MED ORDER — ISOSORB DINITRATE-HYDRALAZINE 20-37.5 MG PO TABS: 1.0000 | ORAL_TABLET | Freq: Three times a day (TID) | ORAL | 0 refills | Status: DC

## 2018-04-26 MED ORDER — SPIRONOLACTONE 25 MG PO TABS: 12.5000 mg | ORAL_TABLET | Freq: Every day | ORAL | 0 refills | Status: DC

## 2018-04-26 MED ORDER — CARVEDILOL 6.25 MG PO TABS: 6.2500 mg | ORAL_TABLET | Freq: Two times a day (BID) | ORAL | 0 refills | Status: DC

## 2018-04-26 NOTE — Assessment & Plan Note (Signed)
Drop from 116 in early April to 105 now. Unclear etiology, could be one of several causes:  She is taking what seems to be too much furosemide (60 mg) daily which could be contributing. Her lower extremity edema has improved significantly.  She did have an A1C of 9.3 in mid March 2020 but her glucose readings are stable at home. She has a good appetite and is eating.  Also consider return of her colon cancer, referral was placed to GI for this in late March 2020 but it appears that they are not scheduling due to Covid-19. She has no rectal bleeding or other alarm signs.  Following with Endo for hyperparathyroidism. Seems stable.   Will have her call her cardiologist to confirm the dose of her furosemide. Will also have her add in an extra Boost during the day. She will continue to monitor her weights daily and report continued loss.

## 2018-04-26 NOTE — Telephone Encounter (Signed)
Noted. What dose of furosemide is she taking daily? 20 mg? 40 mg? What did her heart doctor say in regards to taking these medications?

## 2018-04-26 NOTE — Telephone Encounter (Signed)
Noted.

## 2018-04-26 NOTE — Telephone Encounter (Addendum)
Patient is schedule for telephone appointment today  Refills sent as informed. Patient has been notified.

## 2018-04-26 NOTE — Telephone Encounter (Signed)
Per instructed from PCP, these refills are suppose to be sent to pulmonology.

## 2018-04-26 NOTE — Patient Instructions (Addendum)
Continue to monitor your blood sugars and call me if you see readings consistently above 150.  Do not take the Glipizide medication for diabetes right now.  Call your heart doctor to discuss the appropriate dose of your furosemide medication.  Add in an extra Boost during the day as discussed.   Continue to track your weight and notify me if you continue to lose weight.  It was a pleasure to see you today!

## 2018-04-26 NOTE — Telephone Encounter (Signed)
Spoken and notified patient of [name] comments. Patient is taking 20 mg. And she will discuss this with her heart doctor. Her husband wrote it down.

## 2018-04-26 NOTE — Assessment & Plan Note (Signed)
A1C of 9.3 in mid March 2020, she is not taking anything. Glucose readings seem very stable at 120-130's.  Will have them continue to monitor glucose readings and call me if they see readings at or above 150 on a consistent basis.   Hold off on treatment at this time.

## 2018-04-26 NOTE — Progress Notes (Signed)
Subjective:    Patient ID: Denise [name], female    DOB: 1945/08/01, 73 y.o.   MRN: [id number]  HPI     Denise [name] - 73 y.o. female  MRN [id number]  Date of Birth: 01-23-1945  PCP: [name], NP  This service was provided via telemedicine. Phone Visit performed on 04/26/2018    Rationale for phone visit along with limitations reviewed. Patient consented to telephone encounter.    Location of patient: Home Location of provider: Office at [employer] @ [facility] Name of referring provider: N/A   Names of persons and role in encounter: Provider: [name], NP  Patient: Denise [name]  Other: N/A    Time on call: 24 min, 5 sec   Subjective: CC: Unintended weight loss HPI:  Ms. Czerwonka is a 73 year old female with a history of colon cancer, hyperparathyroidism, COPD with chronic respiratory failure, CHF on diuretics, type 2 diabetes who presents today with a chief complaint of unintended weight loss.   Wt Readings from Last 3 Encounters:  04/02/18 120 lb (54.4 kg)  03/21/18 112 lb 3.2 oz (50.9 kg)  03/12/18 128 lb (58.1 kg)   She's been monitoring her weight at home on her own scale wearing the same type of clothing and has noticed a drop. She's tracking her weight every morning. She endorses a poor appetite in March 2020, had to force herself to eat, appetite is now better. She is taking furosemide 60 mg once daily, she's not sure what dose she's supposed to be taking, she has not touched base with her cardiologist recently. She urinates 3 times daily on average. She denies hematuria, bloody stools.   04/05: 116 lbs 04/02: 116 lbs 04/03: 116 lbs 04/04: 116 lbs 04/05: 110 lbs 04/06: 110 lbs 04/07: 110 lbs 04/08: 110 lbs 04/09: 110 lbs 04/10: 109 lbs 04/11: 113 lbs 04/12: 109 lbs 04/13: 108.4 lbs 04/14: 105.4 lbs 04/15: 105.4 lbs 04/16: 108.8 lbs 04/17: 105 lbs 04/18: 101.2 lbs 04/19: 105.6 lbs 04/20: 104.8 lbs  04/21: 102.8 lbs 04/22: 102 lbs 04/23: 99 lbs 04/24: 104 lbs  She's checking her glucose in the morning when fasting which is running 120's-140's. A1C of 9.3 in mid March 2020. She is not taking her glipizide.   Diet currently consists of:  Breakfast: Fruit, sausage, biscuit Lunch: Boost Dinner: Vegetables, rice, mac and cheese, potato salad, chicken Snacks: Candy, pork skins, chips Desserts: 3-4 days weekly  Beverages: Boost, coffee, diet soda (some), water  Exercise: She is not exercising   Objective/Observations:   No physical exam or vital signs collected unless specifically identified below.   There were no vitals taken for this visit.   Respiratory status: speaks in complete sentences without evident shortness of breath.   Assessment/Plan:  See problem based charting.  No problem-specific Assessment & Plan notes found for this encounter.   I discussed the assessment and treatment plan with the patient. The patient was provided an opportunity to ask questions and all were answered. The patient agreed with the plan and demonstrated an understanding of the instructions.  Lab Orders  No laboratory test(s) ordered today    No orders of the defined types were placed in this encounter.   The patient was advised to call back or seek an in-person evaluation if the symptoms worsen or if the condition fails to improve as anticipated.  [name], NP    Review of Systems  Constitutional: Positive for unexpected  weight change. Negative for fever.  Respiratory: Negative for shortness of breath.   Cardiovascular: Negative for chest pain.  Gastrointestinal: Negative for blood in stool.  Genitourinary: Negative for dysuria and hematuria.  Neurological: Negative for dizziness and weakness.       Past Medical History:  Diagnosis Date  . Altered mental status   . Anxiety and depression   . Colon cancer ([facility]) 1988   Resected  . COPD (chronic obstructive  pulmonary disease) ([facility])   . Diabetes mellitus without complication ([name])    diet controlled- no meds. per pt  . Diverticulosis   . Hyperlipidemia   . Hypertension   . Insomnia   . Primary hyperparathyroidism ([facility])   . Tubular adenoma of rectum 02/23/2013   low grade  . Vitamin D deficiency      Social History   Socioeconomic History  . Marital status: Married    Spouse name: Not on file  . Number of children: 3  . Years of education: Not on file  . Highest education level: Not on file  Occupational History  . Not on file  Social Needs  . Financial resource strain: Not on file  . Food insecurity:    Worry: Not on file    Inability: Not on file  . Transportation needs:    Medical: Not on file    Non-medical: Not on file  Tobacco Use  . Smoking status: Former Smoker    Packs/day: 1.00    Years: 30.00    Pack years: 30.00    Types: Cigarettes    Last attempt to quit: 1989    Years since quitting: 31.3  . Smokeless tobacco: Never Used  Substance and Sexual Activity  . Alcohol use: No  . Drug use: No  . Sexual activity: Yes    Partners: Male    Birth control/protection: Post-menopausal, Surgical  Lifestyle  . Physical activity:    Days per week: Not on file    Minutes per session: Not on file  . Stress: Not on file  Relationships  . Social connections:    Talks on phone: Not on file    Gets together: Not on file    Attends religious service: Not on file    Active member of club or organization: Not on file    Attends meetings of clubs or organizations: Not on file    Relationship status: Not on file  . Intimate partner violence:    Fear of current or ex partner: Not on file    Emotionally abused: Not on file    Physically abused: Not on file    Forced sexual activity: Not on file  Other Topics Concern  . Not on file  Social History Narrative   Married. 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 ABDOMINAL HYSTERECTOMY  08/2015  . BREAST BIOPSY Left   . BREAST EXCISIONAL BIOPSY Right   . COLON RESECTION  1980's  . COLONOSCOPY    . ENDOMETRIAL BIOPSY  2009   negative  . INCONTINENCE SURGERY  2017  . RIGHT HEART CATH N/A 09/20/2017   Procedure: RIGHT HEART CATH;  Surgeon: Larey Dresser, MD;  Location: [facility];  Service: Cardiovascular;  Laterality: N/A;  . RIGHT/LEFT HEART CATH AND CORONARY ANGIOGRAPHY N/A 03/19/2018  Procedure: RIGHT/LEFT HEART CATH AND CORONARY ANGIOGRAPHY;  Surgeon: Larey Dresser, MD;  Location: [facility];  Service: Cardiovascular;  Laterality: N/A;    Family History  Problem Relation Age of Onset  . Ovarian cancer Mother   . Breast cancer Neg Hx   . Hyperparathyroidism Neg Hx   . Colon cancer Neg Hx   . Esophageal cancer Neg Hx   . Stomach cancer Neg Hx     No Known Allergies  Current Outpatient Medications on File Prior to Visit  Medication Sig Dispense Refill  . albuterol (PROVENTIL HFA;VENTOLIN HFA) 108 (90 Base) MCG/ACT inhaler INHALE 1-2 PUFFS INTO THE LUNGS EVERY 4 HOURS AS NEEDED FOR WHEEZING OR SHORTNESS OF BREATH. 6.7 g 5  . aspirin EC 81 MG EC tablet Take 1 tablet (81 mg total) by mouth daily. 30 tablet 0  . cinacalcet (SENSIPAR) 30 MG tablet Take 1 tablet (30 mg total) by mouth every Monday, Wednesday, and Friday. 30 tablet 0  . famotidine (PEPCID) 40 MG tablet TAKE 1 TABLET BY MOUTH TWICE A DAY (Patient taking differently: Take 40 mg by mouth daily. ) 180 tablet 0  . feeding supplement, ENSURE ENLIVE, (ENSURE ENLIVE) LIQD Take 237 mLs by mouth 2 (two) times daily between meals. 60 Bottle 0  . fluticasone furoate-vilanterol (BREO ELLIPTA) 100-25 MCG/INH AEPB Inhale 1 puff into the lungs daily. 1 each 0  . furosemide (LASIX) 20 MG tablet Take 20 mg by mouth.    . losartan (COZAAR) 100 MG tablet TAKE 1 TABLET BY MOUTH ONCE DAILY FOR BLOOD  PRESSURE 90 tablet 0  . mirtazapine (REMERON) 15 MG tablet TAKE 1 TABLET BY MOUTH AT BEDTIME. FOR DEPRESSION AND APPETITE. (Patient taking differently: Take 15 mg by mouth at bedtime. For depression and appetite) 90 tablet 0  . Multiple Vitamin (MULTIVITAMIN WITH MINERALS) TABS tablet Take 1 tablet by mouth daily.    [name] omeprazole (PRILOSEC) 20 MG capsule Take 20 mg by mouth daily.    . OXYGEN Inhale 2 L into the lungs continuous.     . potassium chloride SA (K-DUR,KLOR-CON) 20 MEQ tablet Take 1 tablet (20 mEq total) by mouth 2 (two) times daily. For low potassium. (Patient taking differently: Take 20 mEq by mouth daily. For low potassium.) 10 tablet 0  . predniSONE (DELTASONE) 10 MG tablet Take 10 mg by mouth daily with breakfast.    . Tiotropium Bromide-Olodaterol (STIOLTO RESPIMAT) 2.5-2.5 MCG/ACT AERS Inhale 2 puffs into the lungs daily. 1 Inhaler 0  . [DISCONTINUED] glipiZIDE (GLUCOTROL) 5 MG tablet Take 0.5 tablets (2.5 mg total) by mouth daily before breakfast. 60 tablet 0   No current facility-administered medications on file prior to visit.     There were no vitals taken for this visit.   Objective:   Physical Exam  Constitutional: She is oriented to person, place, and time.  Respiratory: Effort normal. No respiratory distress.  Neurological: She is alert and oriented to person, place, and time.  Psychiatric: She has a normal mood and affect.           Assessment & Plan:

## 2018-04-26 NOTE — Telephone Encounter (Signed)
Patient stated she talked to her PCP already. Patient had gotten confused between her furosemide and famotide, and she was taking the wrong dose of furosemide. Patient stated she is now taking the correct dose. Patient also requested a copy of her discharge summary from the hospital. Printed out copy for patient and will mail it to her address on file.

## 2018-04-26 NOTE — Telephone Encounter (Signed)
Patient called back to let [name] NP know that she was confused about her medications. Patient thought that her Famotidine and Furosemide were for the same thing. Patient stated that she realized after getting home that one makes her go to the bathroom and the other is for heartburn. Patient stated that she takes Furosemide once a day and the Famotidine twice a day.

## 2018-04-26 NOTE — Telephone Encounter (Signed)
Pt c/o medication issue:  1. Name of Medication: furosemide (LASIX) 20 MG tablet  2. How are you currently taking this medication (dosage and times per day)?  3. Are you having a reaction (difficulty breathing--STAT)? no  4. What is your medication issue? She wants to know if she should back taking this.  She is losing a lot of weight.

## 2018-04-26 NOTE — Telephone Encounter (Signed)
Patient evaluated.

## 2018-04-26 NOTE — Telephone Encounter (Signed)
Per instructed from PCP, these refills are suppose to be sent to cardiology.

## 2018-04-26 NOTE — Telephone Encounter (Signed)
Called and spoke with pt who wanted to know if any of her meds she is taking could contribute to weight loss. Stated to pt the furosemide 76m tablet that she is on is one that could contribute to her losing weight with it being a diuretic, its purpose is for her to urinate more to get the excess fluid off of her body which would then contribute to weight loss. Pt expressed understanding. Nothing further needed.

## 2018-04-28 ENCOUNTER — Other Ambulatory Visit: Payer: Self-pay | Admitting: Primary Care

## 2018-04-29 DIAGNOSIS — J9611 Chronic respiratory failure with hypoxia: Secondary | ICD-10-CM | POA: Diagnosis not present

## 2018-04-30 ENCOUNTER — Other Ambulatory Visit: Payer: Self-pay | Admitting: General Surgery

## 2018-04-30 ENCOUNTER — Telehealth: Payer: Self-pay | Admitting: Internal Medicine

## 2018-04-30 ENCOUNTER — Telehealth: Payer: Self-pay

## 2018-04-30 DIAGNOSIS — I5033 Acute on chronic diastolic (congestive) heart failure: Secondary | ICD-10-CM | POA: Diagnosis not present

## 2018-04-30 DIAGNOSIS — I2510 Atherosclerotic heart disease of native coronary artery without angina pectoris: Secondary | ICD-10-CM | POA: Diagnosis not present

## 2018-04-30 DIAGNOSIS — J440 Chronic obstructive pulmonary disease with acute lower respiratory infection: Secondary | ICD-10-CM | POA: Diagnosis not present

## 2018-04-30 DIAGNOSIS — I272 Pulmonary hypertension, unspecified: Secondary | ICD-10-CM | POA: Diagnosis not present

## 2018-04-30 DIAGNOSIS — I110 Hypertensive heart disease with heart failure: Secondary | ICD-10-CM | POA: Diagnosis not present

## 2018-04-30 DIAGNOSIS — J9621 Acute and chronic respiratory failure with hypoxia: Secondary | ICD-10-CM | POA: Diagnosis not present

## 2018-04-30 DIAGNOSIS — J441 Chronic obstructive pulmonary disease with (acute) exacerbation: Secondary | ICD-10-CM | POA: Diagnosis not present

## 2018-04-30 DIAGNOSIS — J188 Other pneumonia, unspecified organism: Secondary | ICD-10-CM | POA: Diagnosis not present

## 2018-04-30 DIAGNOSIS — E119 Type 2 diabetes mellitus without complications: Secondary | ICD-10-CM | POA: Diagnosis not present

## 2018-04-30 MED ORDER — ALBUTEROL SULFATE HFA 108 (90 BASE) MCG/ACT IN AERS: INHALATION_SPRAY | RESPIRATORY_TRACT | 5 refills | Status: AC

## 2018-04-30 MED ORDER — FLUTICASONE FUROATE-VILANTEROL 100-25 MCG/INH IN AEPB: 1.0000 | INHALATION_SPRAY | Freq: Every day | RESPIRATORY_TRACT | 5 refills | Status: DC

## 2018-04-30 NOTE — Telephone Encounter (Signed)
Dr. Melvyn Novas, to clarify:  Do you want the patient to stop taking the Furosemide you issued and contact her cardiologist directly regarding her weight loss and use of diuretics?

## 2018-04-30 NOTE — Telephone Encounter (Signed)
Gave the approval for the orders.

## 2018-04-30 NOTE — Telephone Encounter (Signed)
Prefer all diuretics and monitoring be done by one service to prevent miscommunication and defer all these issues to cards unless she /they request me to do otherwise.  It may be though the aldactone addition really helped the furosemide work better so will need ongoing titration of the furosemide

## 2018-04-30 NOTE — Telephone Encounter (Signed)
Called and spoke with pt and her husband and stated to them the info from [facility]. Stated to them for pt to stop taking furosemide and contact cardiology regarding weight loss and use of diuretics. Stated to them if they  Had not heard from cardiology by the end of the week to contact our office back and we would get her scheduled for an appt with MW. Pt expressed understanding. Nothing further needed.

## 2018-04-30 NOTE — Telephone Encounter (Signed)
Last office visit 3.26.2020 w/ TP: Follow Up Instructions: Elevate legs as much as possible .  Low salt diet .  Continue on Lasix and Spironolactone .  Call Cardiology today for follow up .  Continue on Spiriva and BREO .  Continue on Oxygen 2l/m .  Follow up with Dr. Melvyn Novas  In 6 weeks and As needed   Please contact office for sooner follow up if symptoms do not improve or worsen or seek emergency care     Refills sent

## 2018-04-30 NOTE — Telephone Encounter (Signed)
Called back [place] with [facility]. I advised the Spironolactone was issued by Dr. Johnsie Cancel with  Cardiology. Asked if she saw the name on the prescription bottle for the Furosemide since the provider name was not showing on the medication list. She stated it was Dr. Melvyn Novas.  Called [facility] [phone]) and confirmed the prescription was issued by Dr. Melvyn Novas 03/27/18 with 2 refills.  Called the patient and she said in January she weighed 120lbs. Today she is down to 101 lbs. Patient confirmed that her appetite has improved and she is eating regular meals.  Patient confirmed she drinks three 16 oz bottles of water daily and goes to the bathroom 4 - 5 times during the date. Patient is taking both the Furosemide and Spironolactone issued.   Message above routed to Dr. Melvyn Novas to if patient can stop taking the Furosemide.  Dr. Melvyn Novas, please see the above information and advise if you recommend the patient stop using the Furosemide issued.  Also copied Dr. Johnsie Cancel (Cardiology) as an FYI.

## 2018-04-30 NOTE — Telephone Encounter (Signed)
Patient is returning phone call.  Patient phone number is [phone].

## 2018-04-30 NOTE — Telephone Encounter (Signed)
Anda Kraft PT with [facility] left v/m requesting verbal orders to have additional [facility] physical therapy in home 2 x a wk for 3 wks to help pt to progress to walking outside in pts yard.Please advise.

## 2018-04-30 NOTE — Telephone Encounter (Signed)
Yes that would be best but if not hearing back from them best to see her in office by end of week with all active meds in hand to regoup as she gets very easily confused with instructions

## 2018-04-30 NOTE — Telephone Encounter (Signed)
LVMTCB x 1 for patient. 

## 2018-04-30 NOTE — Telephone Encounter (Signed)
Approved.

## 2018-05-02 ENCOUNTER — Telehealth: Payer: Self-pay | Admitting: Cardiovascular Disease

## 2018-05-02 ENCOUNTER — Telehealth: Payer: Self-pay | Admitting: Internal Medicine

## 2018-05-02 ENCOUNTER — Encounter: Payer: Self-pay | Admitting: Internal Medicine

## 2018-05-02 ENCOUNTER — Ambulatory Visit: Payer: Medicare HMO | Admitting: Internal Medicine

## 2018-05-02 ENCOUNTER — Other Ambulatory Visit: Payer: Self-pay

## 2018-05-02 VITALS — BP 110/78 | HR 78 | Ht 67.0 in | Wt 108.2 lb

## 2018-05-02 DIAGNOSIS — J9611 Chronic respiratory failure with hypoxia: Secondary | ICD-10-CM

## 2018-05-02 DIAGNOSIS — R600 Localized edema: Secondary | ICD-10-CM

## 2018-05-02 DIAGNOSIS — I272 Pulmonary hypertension, unspecified: Secondary | ICD-10-CM | POA: Diagnosis not present

## 2018-05-02 DIAGNOSIS — R946 Abnormal results of thyroid function studies: Secondary | ICD-10-CM

## 2018-05-02 DIAGNOSIS — J449 Chronic obstructive pulmonary disease, unspecified: Secondary | ICD-10-CM

## 2018-05-02 DIAGNOSIS — J9612 Chronic respiratory failure with hypercapnia: Secondary | ICD-10-CM

## 2018-05-02 HISTORY — DX: Abnormal results of thyroid function studies: R94.6

## 2018-05-02 LAB — BASIC METABOLIC PANEL
BUN: 26 mg/dL — ABNORMAL HIGH (ref 6–23)
CO2: 34 mEq/L — ABNORMAL HIGH (ref 19–32)
Calcium: 11 mg/dL — ABNORMAL HIGH (ref 8.4–10.5)
Chloride: 103 mEq/L (ref 96–112)
Creatinine, Ser: 1.02 mg/dL (ref 0.40–1.20)
GFR: 64.3 mL/min (ref >60.00)
Glucose, Bld: 144 mg/dL — ABNORMAL HIGH (ref 70–99)
Potassium: 3.5 mEq/L (ref 3.5–5.1)
Sodium: 143 mEq/L (ref 135–145)

## 2018-05-02 LAB — CBC WITH DIFFERENTIAL/PLATELET
Basophils Absolute: 0.1 10*3/uL (ref 0.0–0.1)
Basophils Relative: 1.6 % (ref 0.0–3.0)
Eosinophils Absolute: 0.1 10*3/uL (ref 0.0–0.7)
Eosinophils Relative: 1.8 % (ref 0.0–5.0)
HCT: 38.5 % (ref 36.0–46.0)
Hemoglobin: 13 g/dL (ref 12.0–15.0)
Lymphocytes Relative: 25.3 % (ref 12.0–46.0)
Lymphs Abs: 1.2 10*3/uL (ref 0.7–4.0)
MCHC: 33.7 g/dL (ref 30.0–36.0)
MCV: 84.7 fl (ref 78.0–100.0)
Monocytes Absolute: 0.4 10*3/uL (ref 0.1–1.0)
Monocytes Relative: 8.6 % (ref 3.0–12.0)
Neutro Abs: 3 10*3/uL (ref 1.4–7.7)
Neutrophils Relative %: 62.7 % (ref 43.0–77.0)
Platelets: 206 10*3/uL (ref 150.0–400.0)
RBC: 4.55 Mil/uL (ref 3.87–5.11)
RDW: 15.8 % — ABNORMAL HIGH (ref 11.5–15.5)
WBC: 4.8 10*3/uL (ref 4.0–10.5)

## 2018-05-02 LAB — TSH: TSH: 0.65 u[IU]/mL (ref 0.35–4.50)

## 2018-05-02 NOTE — Progress Notes (Signed)
Subjective:     Patient ID: Denise [name], female   DOB: Oct 20, 1945,    MRN: [id number]    Brief patient profile:  36 yobf  Quit smoking 1989 with doe then dx'd with copd in [place] in mid 1990s where lived x 25 years and started on spiriva around 2005-10 and helped some and since 2017 back in [place] and breathing generally better in [place] but not doing as many steps and changed from spiriva to symb but not taking it regulary and no need for rescue inhaler referred to pulmonary clinic 09/11/2016 by Dr   Malena Edman.    History of Present Illness  09/11/2016 1st [facility] office visit/ Wert  Re GOLD II copd  Chief Complaint  Patient presents with  . pulmonary consult    Referred by Dr. Carlis Abbott for COPD. patient states she is out of breath on exertion, denies chest pain and chest tightness  doe = MMRC2 = can't walk a nl pace on a flat grade s sob but does fine slow and flat eg shopping  rec Plan A = Automatic =  symbicort 160 up to 2 every 12 hours Work on inhaler technique:  Plan B = Backup Only use your albuterol (ventolin) as a rescue medication Return in 3 months     10/02/2017 extended post hosp f/u ov/Wert re: copd /clearance for parathyroid surgery Chief Complaint  Patient presents with  . Hospitalization Follow-up    admitted to hospital 09/16/17 until 09/24/17. She was sent home with o2 but not using it much. She states she has occ SOB with exertion. She is using her ventolin inhaler 4 x daily on average.   Dyspnea:  MMRC3 = can't walk 100 yards even at a slow pace at a flat grade s stopping due to sob   Cough: not much at al now  Sleeping: bed flat/ 2 pillows  SABA use: 4 x daily - was less while on symb 160 vs advair 250 though hfa quite poor (see copd a/p)  02:  1-2lpm   rec Plan A = Automatic = symbicort 160 Take 2 puffs first thing in am and then another 2 puffs about 12 hours later.  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 Follow-up    She states she is needing letter releasing her to return to work.  She states her breathing has improved.   Dyspnea:  mb and back flat better since symbicort 160 though hfa quite poor  Cough: none Sleeping: bed flat/ 2 pillows SABA use: rarely since symbicort 02: have it but not using x sometimes hs   rec Plan A = Automatic = Stiolto 2 pffs each am only and no more Symbicort  Work on inhaler technique:  relax and gently blow all the way out then take a nice smooth deep breath back in, triggering the inhaler at same time you start breathing in.  Hold for up to 5 seconds if you can.  Rinse and gargle with water when done Plan B = Backup Only use your albuterol as a rescue medication to be used if you can't catch your breath by resting or doing a relaxed purse lip breathing pattern.  -  The less you use it, the better it will work when you need it. - Ok to use the inhaler up to 2 puffs  every 4 hours if you must but call for appointment if use goes up over your usual need - Don't leave home without it !!  (think of it like the spare tire for your car)  Ok to return to light duty only  Keep previous appt      10/17/2017  f/u ov/Wert re:  GOLD II copd / cor pulmonale - not using 02 as rec  Chief Complaint  Patient presents with  . Follow-up    Breathing is about the same. She has good days and bad days. She is using her albuterol inhaler 2 x daily on average.   Dyspnea:  mb and back with 02 is easier than 02 but still not using it Cough: minimal Sleeping: bed flat/ 2pillows SABA use: sev times a day  02:  Using 2lpm at bedtime and sometimes with activty   rec Continue stiolto 2 pffs each am  02 2lpm at bedtime and 2lpm with any activity  Please see patient  coordinator before you leave today  to schedule GI eval by Carlean Purl for abd pain / wt loss  Please schedule a follow up office visit in 2 weeks with full pfts and all active meds / inahlers in hand   11/01/2017  f/u ov/Wert re:  GOLD II copd with emphysemic features/ chronic hypoxemia/ cor pulmonale  Chief Complaint  Patient presents with  . Follow-up    Breathing is "okay". She is using her albuterol inhaler once daily on average.   Dyspnea:  Still walking mb and back struggle if not wearing 02  Cough: no Sleeping: flat SABA use: once daily / confused with stiolto vs saba  02: prn/ confused with instructions rec Plan A = Automatic = stiolto 2 pffs just in am and 02 at bedtime  Work on inhaler technique:   Wear 02 2lpm with any activity more than room to room  Plan B = Backup Only use your albuterol as a rescue medication   Please schedule a follow up office visit in 6 weeks, call sooner if needed with all medications /inhalers/ solutions in hand so we can verify exactly what you are taking.   Your Nov 8 appt is with cardiology: [phone]    12/19/2017  f/u ov/Wert re: GOLD II spirometry with emphysemic features/ hypoxemia and cor pulmonale  Chief Complaint  Patient presents with  . Follow-up    Breathing has been worse over the past 2 wks. She is getting SOB walking just a few steps. She is not using her o2 as prescribed.   Dyspnea:  Better with 02 than s  Cough: no excess mucus  Sleeping: on side/ 2-3 pillows/ bed flat  SABA use: none 02: wears 02 2lpm at bedtime not using daytime and did not bring what she does have with her to office rec Prednisone 10 mg  X 2 each am with bfast, once better reduce to one daily  No change in stiolto Wear 2lpm at bedtime Please see patient coordinator before you leave today  to schedule best fit per advanced for ambulatory 02 to keep your 02 level over 90%  Please schedule a follow up office visit with NP  in  2 weeks, sooner if needed   with all medications /inhalers/ solutions in hand so we can verify exactly what you are taking. This includes all medications  from all doctors and over the counters   NP 01/09/2018  COPD medication: - Take Stiolto every day- 2 puffs in the morning  - Use Albuterol/Ventolin rescue inhaler only as needed - take 2 puffs every 4-6 hours for shortness of breath or wheezing  Oxygen:  - Wear 1-2L oxygen to keep O2 >90% during the day and on exertion  - Wear 2L oxygen at night  Follow-up: - 2-3 months with Dr. Melvyn Novas and as needed    03/12/2018  f/u ov/Wert re:   GOLD II / cor pulmonale, thoroughly confused with instructions Chief Complaint  Patient presents with  . Follow-up    Breathing worse since the last visit. She is using her albuterol inhaler 2 x per day.   Dyspnea:  Room to room worse since stopped prednisone/ thinks it may have been helping but clear she did not taper it as rec at last ov Cough: no Sleeping: bed flat/ 2-3pillows otherwise can't breathe SABA use: 2 x daily on generic advair  02: 24/7 at 2lpm  rec Pulse oximetry is available at [facility] and you should adjust the flow to keep it above 90% Depomedrol 120 mg IM today Start back on stiolto 2 pffs each am Only use your albuterol (either ventolin or proair but not both) as a rescue medication Work on inhaler technique:  Add furosemide back at 20 mg daily and this should bring the swelling down    05/02/2018  f/u ov/Wert re: copd II spirometry but c/b chronic resp failure/ cor pulmonale with improved leg swelling since adding furosemide "but I just make urine all day"  Chief Complaint  Patient presents with  . Follow-up    breathing not doing well, weight loss  Dyspnea:  Walking room to room on 2lpm  Cough: ok on otc cough syrup Sleeping: on back /side/ bed is flat  SABA use: very confused with various inhalers 02: 2lpm 24/7    No obvious day to day or daytime variability or assoc excess/ purulent sputum or mucus  plugs or hemoptysis or cp or chest tightness, subjective wheeze or overt sinus or hb symptoms.   Sleeping ok  without nocturnal  or early am exacerbation  of respiratory  c/o's or need for noct saba. Also denies any obvious fluctuation of symptoms with weather or environmental changes or other aggravating or alleviating factors except as outlined above   No unusual exposure hx or h/o childhood pna/ asthma or knowledge of premature birth.  Current Allergies, Complete Past Medical History, Past Surgical History, Family History, and Social History were reviewed in [employer] record.  ROS  The following are not active complaints unless bolded Hoarseness, sore throat, dysphagia, dental problems, itching, sneezing,  nasal congestion or discharge of excess mucus or purulent secretions, ear ache,   fever, chills, sweats, unintended wt loss or wt gain, classically pleuritic or exertional cp,  orthopnea pnd or arm/hand swelling  or leg swelling improved, presyncope, palpitations, abdominal pain, anorexia, nausea, vomiting, diarrhea  or change in bowel habits or change in bladder habits, change in stools or change in urine, dysuria, hematuria,  rash, arthralgias, visual complaints, headache, numbness, weakness or ataxia or problems with walking or coordination,  change in mood or  memory.        Current Meds - - NOTE:   Unable to verify as accurately reflecting what pt takes     Medication Sig  . albuterol (VENTOLIN HFA) 108 (90 Base) MCG/ACT inhaler INHALE 1-2 PUFFS  INTO THE LUNGS EVERY 4 HOURS AS NEEDED FOR WHEEZING OR SHORTNESS OF BREATH.  [name] aspirin EC 81 MG EC tablet Take 1 tablet (81 mg total) by mouth daily.  . carvedilol (COREG) 6.25 MG tablet Take 1 tablet (6.25 mg total) by mouth 2 (two) times daily with a meal.  . cinacalcet (SENSIPAR) 30 MG tablet Take 1 tablet (30 mg total) by mouth every Monday, Wednesday, and Friday.  . famotidine (PEPCID) 40 MG tablet TAKE 1 TABLET BY  MOUTH TWICE A DAY (Patient taking differently: Take 40 mg by mouth daily. )  . feeding supplement, ENSURE ENLIVE, (ENSURE ENLIVE) LIQD Take 237 mLs by mouth 2 (two) times daily between meals.  . fluticasone furoate-vilanterol (BREO ELLIPTA) 100-25 MCG/INH AEPB Inhale 1 puff into the lungs daily.  . furosemide (LASIX) 20 MG tablet Take 20 mg by mouth.  . isosorbide-hydrALAZINE (BIDIL) 20-37.5 MG tablet Take 1 tablet by mouth 3 (three) times daily.  [name] losartan (COZAAR) 100 MG tablet TAKE 1 TABLET BY MOUTH ONCE DAILY FOR BLOOD PRESSURE  . mirtazapine (REMERON) 15 MG tablet TAKE 1 TABLET BY MOUTH AT BEDTIME. FOR DEPRESSION AND APPETITE.  . Multiple Vitamin (MULTIVITAMIN WITH MINERALS) TABS tablet Take 1 tablet by mouth daily.  [name] omeprazole (PRILOSEC) 20 MG capsule Take 20 mg by mouth daily.  . OXYGEN Inhale 2 L into the lungs continuous.   . potassium chloride SA (K-DUR,KLOR-CON) 20 MEQ tablet Take 1 tablet (20 mEq total) by mouth 2 (two) times daily. For low potassium. (Patient taking differently: Take 20 mEq by mouth daily. For low potassium.)  . predniSONE (DELTASONE) 10 MG tablet Take 10 mg by mouth daily with breakfast.  . spironolactone (ALDACTONE) 25 MG tablet Take 0.5 tablets (12.5 mg total) by mouth daily.  . Tiotropium Bromide-Olodaterol (STIOLTO RESPIMAT) 2.5-2.5 MCG/ACT AERS Inhale 2 puffs into the lungs daily.               Objective:   Physical Exam  amb thin bf nad thoroughly confused with details of care    05/02/2018        108  03/12/2018        128  12/19/2017       112  11/01/2017        118  10/17/2017        114  10/05/2017        120   10/02/2017        119   07/27/16 144 lb 12.8 oz (65.7 kg)  06/02/16 136 lb 6.4 oz (61.9 kg)  11/22/15 156 lb 12.8 oz (71.1 kg)    Vital signs reviewed - Note on arrival 02 sats  90% on 2lpm      HEENT: Full dentures / nloropharynx. Nl external ear canals without cough reflex -  Mild bilateral non-specific turbinate edema      NECK :  without JVD/Nodes/TM/ nl carotid upstrokes bilaterally   LUNGS: no acc muscle use,  Mod barrel  contour chest wall with bilateral  Distant bs s audible wheeze and  without cough on insp or exp maneuver and mod  Hyperresonant  to  percussion bilaterally     CV:  RRR  no s3 or murmur or increase in P2, and trace pitting edema  both LE's    ABD:  soft and nontender with pos mid insp Hoover's  in the supine position. No bruits or organomegaly appreciated, bowel sounds nl  MS:   Nl gait/  ext warm without  deformities, calf tenderness, cyanosis or clubbing No obvious joint restrictions   SKIN: warm and dry without lesions    NEURO:  alert, approp, nl sensorium with  no motor or cerebellar deficits apparent.         Labs ordered/ reviewed:      Chemistry      Component Value Date/Time   NA 143 05/02/2018 1249   K 3.5 05/02/2018 1249   CL 103 05/02/2018 1249   CO2 34 (H) 05/02/2018 1249   BUN 26 (H) 05/02/2018 1249   CREATININE 1.02 05/02/2018 1249   CREATININE 0.84 09/28/2017 1559      Component Value Date/Time   CALCIUM 11.0 (H) 05/02/2018 1249   ALKPHOS 106 03/15/2018 1652   AST 35 03/15/2018 1652   ALT 24 03/15/2018 1652   BILITOT 1.4 (H) 03/15/2018 1652        Lab Results  Component Value Date   WBC 4.8 05/02/2018   HGB 13.0 05/02/2018   HCT 38.5 05/02/2018   MCV 84.7 05/02/2018   PLT 206.0 05/02/2018       EOS                                                               0.1                                    05/02/2018       Lab Results  Component Value Date   TSH 0.65 05/02/2018     Lab Results  Component Value Date   PROBNP 910.0 (H) 05/02/2018         Assessment:

## 2018-05-02 NOTE — Telephone Encounter (Signed)
Being followed by Dr Aundra Dubin and CHF team

## 2018-05-02 NOTE — Telephone Encounter (Signed)
She can stop Lasix for now but needs followup with [place].

## 2018-05-02 NOTE — Telephone Encounter (Signed)
Pt was called and an appt was scheduled for her with MW today, 4/30 at 12pm so he can further take a look at pt's meds. Nothing further needed.

## 2018-05-02 NOTE — Telephone Encounter (Signed)
Pt called back, advised to stop furosemide and need to schedule appt. Message forwarded to scheduler to schedule patient for an appt next week.  Pt verbalized understanding.

## 2018-05-02 NOTE — Addendum Note (Signed)
Addended by: Valeda Malm on: 05/02/2018 04:50 PM   Modules accepted: Orders

## 2018-05-02 NOTE — Patient Instructions (Addendum)
Ok to take the furosemide 20 mg x one pill as needed for excessive swelling   Take wixela one puff twice daily   Only use your albuterol as a rescue medication to be used if you can't catch your breath by resting or doing a relaxed purse lip breathing pattern.  - The less you use it, the better it will work when you need it. - Ok to use up to 2 puffs  every 4 hours if you must but call for immediate appointment if use goes up over your usual need - Don't leave home without it !!  (think of it like the spare tire for your car)    Work on inhaler technique:  relax and gently blow all the way out then take a nice smooth deep breath back in, triggering the inhaler at same time you start breathing in.  Hold for up to 5 seconds if you can.  Rinse and gargle with water when done.      Please remember to go to the lab department   for your tests - we will call you with the results when they are available.      See Tammy NP w/in 2 weeks with all your medications, even over the counter meds, separated in two separate bags, the ones you take no matter what vs the ones you stop once you feel better and take only as needed when you feel you need them.   Tammy  will generate for you a new user friendly medication calendar that will put [place] all on the same page re: your medication use.     Without this process, it simply isn't possible to assure that we are providing  your outpatient care  with  the attention to detail we feel you deserve.   If we cannot assure that you're getting that kind of care,  then we cannot manage your problem effectively from this clinic.  Once you have seen Tammy and we are sure that we're all on the same page with your medication use she will arrange follow up with me.

## 2018-05-02 NOTE — Telephone Encounter (Signed)
Pt called and said she has lost a large amount of weight since starting spironolactone (ALDACTONE) 25 MG tablet. She said she is taking 1/2 tablet as recommended.  When the pt started the medication, she said she weighed 125lbs, but this morning she weighed herself and said she weighed 105 pounds  Her husband thinks it may be the furosemide (LASIX) 20 MG tablet to bring the fluid off of her legs.  The Home Health Nurse noticed the drastic change and asked her to call all her providers to confirm if she really needs the medication.   You can call Husband on his mobile number if you are unable to reach the pt on the land line given. His cell is [phone]

## 2018-05-02 NOTE — Telephone Encounter (Signed)
Received a message from Archie Patten, RN for Dr. Johnsie Cancel who stated that Dr. Johnsie Cancel has been out of the office all week and she feels it is best that we get pt to come in to see MW for an appt to further address her meds. Encounter from 4/28 from [place] stated if pt was unable to hear back from cardiology by the end of the week for her to come in for an appt to see MW so he can further take a look at her meds and get any issues worked out.  Pt currently has an appt scheduled with MW 5/6. Called and spoke with pt's husband in regards to the message I received from Dr. Kyla Balzarine RN and stated it was advised for [place] to get pt in for an appt with MW due to Dr. Johnsie Cancel being out of the office that way we can get meds worked out for her. [name] expressed understanding. [name] appt has been changed from 5/6 with MW to today, 4/30 at 12pm. covid screen was negative. Pt was told to wear a mask and bring all meds with her to the appt. Nothing further needed.

## 2018-05-02 NOTE — Telephone Encounter (Signed)
LM for patient to return call to office to discuss recommendations from Dr. Aundra Dubin

## 2018-05-02 NOTE — Telephone Encounter (Signed)
Called patient after talking with Dr. Gustavus Bryant CMA, who will see about getting patient in to see Dr. Melvyn Novas, since Dr. Johnsie Cancel is out of the office this week. (See phone encounter note on 04/30/18) . Informed patient that Dr. Gustavus Bryant office would be giving her a call. Patient verbalized understanding and agreed to plan. Informed patient if we need to see her after she sees Dr. Melvyn Novas, that we have openings next week for virtual visits to just let our office know.

## 2018-05-03 ENCOUNTER — Encounter: Payer: Self-pay | Admitting: Internal Medicine

## 2018-05-03 DIAGNOSIS — J9611 Chronic respiratory failure with hypoxia: Secondary | ICD-10-CM | POA: Insufficient documentation

## 2018-05-03 DIAGNOSIS — J9612 Chronic respiratory failure with hypercapnia: Secondary | ICD-10-CM

## 2018-05-03 HISTORY — DX: Chronic respiratory failure with hypoxia: J96.11

## 2018-05-03 LAB — BRAIN NATRIURETIC PEPTIDE: Pro B Natriuretic peptide (BNP): 910 pg/mL — ABNORMAL HIGH (ref 0.0–100.0)

## 2018-05-03 LAB — T4
Free Thyroxine Index: 2.3 (ref 1.4–3.8)
T4, Total: 7.3 ug/dL (ref 5.1–11.9)

## 2018-05-03 LAB — T3 UPTAKE: T3 Uptake: 31 % (ref 22–35)

## 2018-05-03 MED ORDER — FLUTICASONE-SALMETEROL 250-50 MCG/DOSE IN AEPB: INHALATION_SPRAY | RESPIRATORY_TRACT | Status: DC

## 2018-05-03 NOTE — Assessment & Plan Note (Addendum)
TFTs completely wnl / Wt loss likely related to diuresis though can't exclude underlying malignancy here and f/u pcp planned.     Denise [name]

## 2018-05-03 NOTE — Assessment & Plan Note (Signed)
Improved, see cor pulmonale

## 2018-05-03 NOTE — Assessment & Plan Note (Signed)
See [place] 09/20/17  With  low CO ? Accuracy of PVR   Much better with addition of aldactone so now can change furosemide to as needed for excess leg swelling but continue aldactone as maint rx

## 2018-05-03 NOTE — Progress Notes (Signed)
Spoke with pt and notified of results per Dr. Wert. Pt verbalized understanding and denied any questions. 

## 2018-05-03 NOTE — Assessment & Plan Note (Signed)
Quit smoking 1989 - Spirometry 09/11/2016  FEV1 1.03 (52%)  Ratio 54 mild curvature, on no rx - 09/11/2016  After extensive coaching HFA effectiveness =    50% from baseline 10% > continue  symb 160 2bid but consider change to bevespi or stiolto  - 10/02/2017  After extensive coaching inhaler device,  effectiveness =    50% from a baseline near 0 > rechallenge with symbicort 160 x 2 bid x 2 weeks then re-group  - 10/05/2017    try stiolto x 2pffs x 2 week sample  - PFT's  11/01/2017  FEV1 0.95 (52 % ) ratio fev1/vc = 69%  p 3 % improvement from saba p ? Stiolto(not sure she used it)  prior to study with DLCO  30/30c % corrects to 59 % for alv volume  With air trapping pattern  confirmed on cxr as well  - 05/02/2018  After extensive coaching inhaler device,  effectiveness =    75% with dpi device   She is thoroughly confused with the different devices she uses so will try the KIS principle and just use the generic equivalent of advair 250 (wixela)  here and use alb hfa prn for now pending return for med reconciliation    To keep things simple, I have asked the patient to first separate medicines that are perceived as maintenance, that is to be taken daily "no matter what", from those medicines that are taken on only on an as-needed basis and I have given the patient examples of both, and then return to see our NP to generate a  detailed  medication calendar which should be followed until the next physician sees the patient and updates it.

## 2018-05-03 NOTE — Assessment & Plan Note (Addendum)
Assoc with ? Cor pulmonale dx 09/2017 so rx 02 1-2 lpm 24/7  - 10/17/2017  Walked RA x 3 laps @ 185 ft each stopped due to  End of study, nl pace,  desat to 87 on 3rd lap - 12/19/2017  Saturations on Room Air at Rest =91 % and while Ambulating = 87%  But  on  2 Liters of pulsed oxygen while Ambulating =93% so rec POC 2lpm walking / use at rest if sats under 90%  - HC03 05/02/2018  = 34   She likely has component of contraction alkalosis from diuretics but this is not a bad outcome if this is the case and is partially mitigated by use of aldactone and high buffering capacity reduces ventilatory demand in this pt with limited ventilatory reserve  so no change in rx needed here = 2lpm 24/7    I had an extended discussion with the patient reviewing all relevant studies completed to date and  lasting 25 minutes of a 40  minute office visit addressing  severe non-specific but potentially very serious refractory respiratory symptoms of uncertain and potentially multiple  Etiologies.   See device teaching which extended face to face time for this visit   Each maintenance medication was reviewed in detail including most importantly the difference between maintenance and prns and under what circumstances the prns are to be triggered using an action plan format that is not reflected in the computer generated alphabetically organized AVS.    Please see AVS for specific instructions unique to this office visit that I personally wrote and verbalized to the the pt in detail and then reviewed with pt  by my nurse highlighting any changes in therapy/plan of care  recommended at today's visit.

## 2018-05-06 DIAGNOSIS — I272 Pulmonary hypertension, unspecified: Secondary | ICD-10-CM | POA: Diagnosis not present

## 2018-05-06 DIAGNOSIS — J441 Chronic obstructive pulmonary disease with (acute) exacerbation: Secondary | ICD-10-CM | POA: Diagnosis not present

## 2018-05-06 DIAGNOSIS — E119 Type 2 diabetes mellitus without complications: Secondary | ICD-10-CM | POA: Diagnosis not present

## 2018-05-06 DIAGNOSIS — J440 Chronic obstructive pulmonary disease with acute lower respiratory infection: Secondary | ICD-10-CM | POA: Diagnosis not present

## 2018-05-06 DIAGNOSIS — J9621 Acute and chronic respiratory failure with hypoxia: Secondary | ICD-10-CM | POA: Diagnosis not present

## 2018-05-06 DIAGNOSIS — I110 Hypertensive heart disease with heart failure: Secondary | ICD-10-CM | POA: Diagnosis not present

## 2018-05-06 DIAGNOSIS — I5033 Acute on chronic diastolic (congestive) heart failure: Secondary | ICD-10-CM | POA: Diagnosis not present

## 2018-05-06 DIAGNOSIS — J188 Other pneumonia, unspecified organism: Secondary | ICD-10-CM | POA: Diagnosis not present

## 2018-05-06 DIAGNOSIS — I2510 Atherosclerotic heart disease of native coronary artery without angina pectoris: Secondary | ICD-10-CM | POA: Diagnosis not present

## 2018-05-07 ENCOUNTER — Ambulatory Visit (HOSPITAL_COMMUNITY)
Admission: RE | Admit: 2018-05-07 | Discharge: 2018-05-07 | Disposition: A | Discharge status: Home / Self Care | Payer: Medicare HMO | Source: Ambulatory Visit | Attending: Cardiology | Admitting: Cardiology

## 2018-05-07 ENCOUNTER — Other Ambulatory Visit: Payer: Self-pay

## 2018-05-07 DIAGNOSIS — I5022 Chronic systolic (congestive) heart failure: Secondary | ICD-10-CM | POA: Diagnosis not present

## 2018-05-07 DIAGNOSIS — I272 Pulmonary hypertension, unspecified: Secondary | ICD-10-CM | POA: Diagnosis not present

## 2018-05-07 DIAGNOSIS — J9611 Chronic respiratory failure with hypoxia: Secondary | ICD-10-CM | POA: Diagnosis not present

## 2018-05-07 MED ORDER — BISOPROLOL FUMARATE 5 MG PO TABS: 2.5000 mg | ORAL_TABLET | Freq: Every day | ORAL | 3 refills | Status: DC

## 2018-05-07 MED ORDER — FUROSEMIDE 20 MG PO TABS: 20.0000 mg | ORAL_TABLET | Freq: Every day | ORAL | 3 refills | Status: DC

## 2018-05-07 NOTE — Progress Notes (Signed)
Spoke to pt to review after visit summary. Pt verbalized understanding and requested a mailed avs.

## 2018-05-07 NOTE — Patient Instructions (Addendum)
You have been referred to the heart failure paramedicine program. Someone will be in contact with you in order to set this up.  STOP Carvedilol   START Bisoprolol 2.43m (0.5 tab) daily  RESTART Furosemide 232m(1 tab) daily  START Tyvaso. This is a specialty medication that needs to be preapproved through your insurance. The company for Tyvaso will be in contact with you to set this up.   Lab work will need to be done in 2 weeks. This is scheduled for May 19th at 10:00am.  Please follow up with Dr. McAundra Dubinn 3 weeks. This is scheduled for June 5th at 11:20am.

## 2018-05-07 NOTE — Progress Notes (Signed)
Heart Failure TeleHealth Note  Due to national recommendations of social distancing due to [place] 19, Audio/video telehealth visit is felt to be most appropriate for this patient at this time.  See MyChart message from today for patient consent regarding telehealth for [facility].  Date:  05/07/2018   ID:  Enid Derry A. Donn, [place] 1945-03-01, MRN [id number]  Location: Home  Provider location: [facility] [facility] Type of Visit: Established patient   PCP:  [name], NP  Cardiologist: Dr. Aundra Dubin  Chief Complaint: Shortness of breath   History of Present Illness: Denise [name] is a 73 y.o. female who presents via Engineer, civil (consulting) for a telehealth visit today.     she denies symptoms worrisome for COVID 19.   Patient has severe COPD on home oxygen, pulmonary HTN, suspected hypertrophic cardiomyopathy, HLD, DM2, Colon cancer s/p resection, hyperparathyroidism pending surgery, and HTN.   Admitted 9/15 - 09/24/17 for acute on chronic hypoxic respiratory failure. RHC at that time by Dr. Aundra Dubin showed significant pulmonary HTN. Thought to be most consistent with WHO group 3/COPD. Hospital course complicated by abdominal pain. GI work up with no clear cause.  Seen by Dr. Melvyn Novas 11/01/17. PFTs that day with significant COPD.   She was hospitalized in 3/20 with PNA/COPD exacerbation and CHF exacerbation.  She was treated with antibiotics and diuresed.  RHC/LHC showed no coronary disease, normal filling pressures, moderate pulmonary hypertension.  V/Q scan showed no chronic PE.  High resolution CT showed moderate COPD, no interstitial lung disease.  Echo was concerning for hypertrophic CMP, so cardiac MRI was done.  LV EF 38%, moderate focal basal septal hypertrophy, mid-wall LGE in a noncoronary pattern suggestive of HCM.   She has continued to lose weight at home.  She has stopped Lasix.  She is only taking Coreg once a day.  She continues to wear home  oxygen 2 L/[place].  She is short of breath after walking about 100 feet (house to car).  No DOE with ADLs in house.  No orthopnea/PND.  No edema. No lightheadedness or chest pain.  She is concerned that she still has significant dyspnea.    Labs (3/20): RF elevated but anti-CCP negative, SPEP negative, anti-centromere Ab negative, anti-SCL 70 negative, ANA negative anti-Jo-1 negative, HIV negative.  Labs (4/20): K 3.5, creatinine 1.02, hgb 13, BNP 910, TSH normal  PMH: 1. Colon cancer: Surgery 1988.  2. COPD: Severe, on home oxygen.  - CT chest (3/20): Moderate emphysema.  3. Hyperlipidemia 4. HTN 5. Primary hyperparathyroidism 6. Pulmonary hypertension: Suspect there is a component of group 3 PH due to lung disease, but think also out of proportion so may be group 1 component.  - RHC (9/19): mean RA 3, PA 57/16 mean 32, mean PCWP 5, CI 1.61, PVR 10 WU.  - High resolution CT chest (3/20) with moderate emphysema, no ILD.  - Echo (3/20) with mild RV dilation/moderately decreased RV systolic function.  - LHC/RHC (3/20): Luminal irregularities in coronaries; mean RA 7, PA 59/22 mean 36, mean PCWP 9, CI 2, PVR 8.6 WU.  - HIV, anti-SCL-70, anti-centromere, CCP, ANA, anti-Jo-1 negative.  - V/Q scan (9/19): No evidence for chronic PE.  7. Suspected hypertrophic cardiomyopathy:  - Echo (3/20) with EF 45%, moderate asymmetric septal hypertrophy, mildly dilated RV with moderately decreased systolic function.  No mitral valve SAM.  - PYP scan (3/20): Not suggestive of transthyretin amyloidosis.  - Cardiac MRI (3/20): LV EF 38%, moderate focal  basal septal hypertrophy, RV EF 30%, LGE pattern suggestive of HCM.    Current Outpatient Medications  Medication Sig Dispense Refill   albuterol (VENTOLIN HFA) 108 (90 Base) MCG/ACT inhaler INHALE 1-2 PUFFS INTO THE LUNGS EVERY 4 HOURS AS NEEDED FOR WHEEZING OR SHORTNESS OF BREATH. 6.7 g 5   aspirin EC 81 MG EC tablet Take 1 tablet (81 mg total) by mouth daily.  30 tablet 0   atorvastatin (LIPITOR) 10 MG tablet      bisoprolol (ZEBETA) 5 MG tablet Take 0.5 tablets (2.5 mg total) by mouth daily. 45 tablet 3   cinacalcet (SENSIPAR) 30 MG tablet Take 1 tablet (30 mg total) by mouth every Monday, Wednesday, and Friday. 30 tablet 0   famotidine (PEPCID) 40 MG tablet TAKE 1 TABLET BY MOUTH TWICE A DAY (Patient taking differently: Take 40 mg by mouth daily. ) 180 tablet 0   feeding supplement, ENSURE ENLIVE, (ENSURE ENLIVE) LIQD Take 237 mLs by mouth 2 (two) times daily between meals. 60 Bottle 0   Fluticasone-Salmeterol (WIXELA INHUB) 250-50 MCG/DOSE AEPB One puff twice daily     furosemide (LASIX) 20 MG tablet Take 1 tablet (20 mg total) by mouth daily. 90 tablet 3   isosorbide-hydrALAZINE (BIDIL) 20-37.5 MG tablet Take 1 tablet by mouth 3 (three) times daily. 90 tablet 0   losartan (COZAAR) 100 MG tablet TAKE 1 TABLET BY MOUTH ONCE DAILY FOR BLOOD PRESSURE 90 tablet 0   mirtazapine (REMERON) 15 MG tablet TAKE 1 TABLET BY MOUTH AT BEDTIME. FOR DEPRESSION AND APPETITE. 90 tablet 0   Multiple Vitamin (MULTIVITAMIN WITH MINERALS) TABS tablet Take 1 tablet by mouth daily.     omeprazole (PRILOSEC) 20 MG capsule Take 20 mg by mouth daily.     OXYGEN Inhale 2 L into the lungs continuous.      potassium chloride SA (K-DUR,KLOR-CON) 20 MEQ tablet Take 1 tablet (20 mEq total) by mouth 2 (two) times daily. For low potassium. (Patient taking differently: Take 20 mEq by mouth daily. For low potassium.) 10 tablet 0   predniSONE (DELTASONE) 10 MG tablet Take 10 mg by mouth daily with breakfast.     spironolactone (ALDACTONE) 25 MG tablet Take 0.5 tablets (12.5 mg total) by mouth daily. 30 tablet 0   No current facility-administered medications for this encounter.     Allergies:   Patient has no known allergies.   Social History:  The patient  reports that she quit smoking about 31 years ago. Her smoking use included cigarettes. She has a 30.00 pack-year  smoking history. She has never used smokeless tobacco. She reports that she does not drink alcohol or use drugs.   Family History:  The patient's family history includes Ovarian cancer in her mother.   ROS:  Please see the history of present illness.   All other systems are personally reviewed and negative.   Exam:  (Video/Tele Health Call; Exam is subjective and or/visual.) General:  Speaks in full sentences. No resp difficulty. Lungs: Normal respiratory effort with conversation.  Abdomen: Non-distended per patient report Extremities: Pt denies edema. Neuro: Alert & oriented x 3.   Recent Labs: 03/15/2018: ALT 24; B Natriuretic Peptide 1,914.0 03/19/2018: Magnesium 1.8 05/02/2018: BUN 26; Creatinine, Ser 1.02; Hemoglobin 13.0; Platelets 206.0; Potassium 3.5; Pro B Natriuretic peptide (BNP) 910.0; Sodium 143; TSH 0.65  Personally reviewed   Wt Readings from Last 3 Encounters:  05/02/18 49.1 kg (108 lb 3.2 oz)  04/02/18 54.4 kg (120 lb)  03/21/18 50.9  kg (112 lb 3.2 oz)      ASSESSMENT AND PLAN:  1. COPD: Stable on home oxygen.  - Per pulmonary.  2.Chronic systolic CHF: With prominent RV failure. Echo 3/20 with EF 45%, asymmetric septal hypertrophy, moderate RV systolic dysfunction. RHC/LHC was done in 3/20, showing no significant CAD and moderate pulmonary arterial hypertension.  Cardiac MRI showed LV EF 38%, moderate basal septal hypertrophy, RV EF 30%, mid-wall LGE in the basal anteroseptal wall and lesser extent in mid lateral wall.  Most concerning for hypertrophic CMP, less likely cardiac amyloidosis.  PYP scan was negative for evidence of transthyretin amyloidosis and SPEP negative.  She does not have a known family history of HCM. Weight is down but she remains dyspneic. BNP in 4/20 was still elevated at 910.  - Restart Lasix at 20 mg daily.  Repeat BMET in a week or so.  - She is only taking Coreg once a day.  Given significant COPD, I will stop this and start bisoprolol 2.5 mg  daily.  - Continue losartan 100 mg daily. Possible switch to [facility] in the future.   - Continue spironolactone 12.5 mg daily.   - Continue Bidil 1 tab tid.  3. Hypertrophic cardiomyopathy: Imaging has been concerning for HCM without LVOT obstruction or SAM.  No family history of HCM or sudden death.   4. Pulmonary hypertension: RHC showed moderate pulmonary arterial hypertension with high PVR (8.6 WU).  V/Q scan (9/19) did not show chronic PE. CT chest high resolution showed no ILD, there was moderate emphysema.  Pulmonary hypertension seems to be out of proportion to degree of lung disease.  Suspect mixed group 1 and group 3 PH.  Serologies sent and came back negative other than RF elevated (CCP negative). HIV negative.  - It is reasonable to give her a trial of selective pulmonary vasodilators for component of group 1 PH.  Would start with Tyvaso to try to avoid V/Q mismatching given moderate emphysema.  I will work on getting this set up.  - 6 minute walk when she is in the office again.    COVID screen The patient does not have any symptoms that suggest any further testing/ screening at this time.  Social distancing reinforced today.  Patient Risk: After full review of this patients clinical status, I feel that they are at moderate risk for cardiac decompensation at this time.  Relevant cardiac medications were reviewed at length with the patient today. The patient does not have concerns regarding their medications at this time.   Recommended follow-up:  3 wks  Today, I have spent 31 minutes with the patient with telehealth technology discussing the above issues .    Signed, Loralie Champagne, MD  05/07/2018  [place] [address] [facility] [place] [address] [phone] (office) [phone] (fax)

## 2018-05-08 ENCOUNTER — Ambulatory Visit: Payer: Medicare HMO | Admitting: Internal Medicine

## 2018-05-08 DIAGNOSIS — I110 Hypertensive heart disease with heart failure: Secondary | ICD-10-CM | POA: Diagnosis not present

## 2018-05-08 DIAGNOSIS — E119 Type 2 diabetes mellitus without complications: Secondary | ICD-10-CM | POA: Diagnosis not present

## 2018-05-08 DIAGNOSIS — I5033 Acute on chronic diastolic (congestive) heart failure: Secondary | ICD-10-CM | POA: Diagnosis not present

## 2018-05-08 DIAGNOSIS — J9621 Acute and chronic respiratory failure with hypoxia: Secondary | ICD-10-CM | POA: Diagnosis not present

## 2018-05-08 DIAGNOSIS — J188 Other pneumonia, unspecified organism: Secondary | ICD-10-CM | POA: Diagnosis not present

## 2018-05-08 DIAGNOSIS — I272 Pulmonary hypertension, unspecified: Secondary | ICD-10-CM | POA: Diagnosis not present

## 2018-05-08 DIAGNOSIS — J441 Chronic obstructive pulmonary disease with (acute) exacerbation: Secondary | ICD-10-CM | POA: Diagnosis not present

## 2018-05-08 DIAGNOSIS — I2510 Atherosclerotic heart disease of native coronary artery without angina pectoris: Secondary | ICD-10-CM | POA: Diagnosis not present

## 2018-05-08 DIAGNOSIS — J440 Chronic obstructive pulmonary disease with acute lower respiratory infection: Secondary | ICD-10-CM | POA: Diagnosis not present

## 2018-05-13 DIAGNOSIS — E119 Type 2 diabetes mellitus without complications: Secondary | ICD-10-CM | POA: Diagnosis not present

## 2018-05-13 DIAGNOSIS — I110 Hypertensive heart disease with heart failure: Secondary | ICD-10-CM | POA: Diagnosis not present

## 2018-05-13 DIAGNOSIS — I2510 Atherosclerotic heart disease of native coronary artery without angina pectoris: Secondary | ICD-10-CM | POA: Diagnosis not present

## 2018-05-13 DIAGNOSIS — J9621 Acute and chronic respiratory failure with hypoxia: Secondary | ICD-10-CM | POA: Diagnosis not present

## 2018-05-13 DIAGNOSIS — J188 Other pneumonia, unspecified organism: Secondary | ICD-10-CM | POA: Diagnosis not present

## 2018-05-13 DIAGNOSIS — J440 Chronic obstructive pulmonary disease with acute lower respiratory infection: Secondary | ICD-10-CM | POA: Diagnosis not present

## 2018-05-13 DIAGNOSIS — J441 Chronic obstructive pulmonary disease with (acute) exacerbation: Secondary | ICD-10-CM | POA: Diagnosis not present

## 2018-05-13 DIAGNOSIS — I5033 Acute on chronic diastolic (congestive) heart failure: Secondary | ICD-10-CM | POA: Diagnosis not present

## 2018-05-13 DIAGNOSIS — I272 Pulmonary hypertension, unspecified: Secondary | ICD-10-CM | POA: Diagnosis not present

## 2018-05-14 DIAGNOSIS — I272 Pulmonary hypertension, unspecified: Secondary | ICD-10-CM | POA: Diagnosis not present

## 2018-05-14 DIAGNOSIS — I110 Hypertensive heart disease with heart failure: Secondary | ICD-10-CM | POA: Diagnosis not present

## 2018-05-14 DIAGNOSIS — I5033 Acute on chronic diastolic (congestive) heart failure: Secondary | ICD-10-CM | POA: Diagnosis not present

## 2018-05-14 DIAGNOSIS — J441 Chronic obstructive pulmonary disease with (acute) exacerbation: Secondary | ICD-10-CM | POA: Diagnosis not present

## 2018-05-14 DIAGNOSIS — J9621 Acute and chronic respiratory failure with hypoxia: Secondary | ICD-10-CM | POA: Diagnosis not present

## 2018-05-14 DIAGNOSIS — J188 Other pneumonia, unspecified organism: Secondary | ICD-10-CM | POA: Diagnosis not present

## 2018-05-14 DIAGNOSIS — I2510 Atherosclerotic heart disease of native coronary artery without angina pectoris: Secondary | ICD-10-CM | POA: Diagnosis not present

## 2018-05-14 DIAGNOSIS — J440 Chronic obstructive pulmonary disease with acute lower respiratory infection: Secondary | ICD-10-CM | POA: Diagnosis not present

## 2018-05-14 DIAGNOSIS — E119 Type 2 diabetes mellitus without complications: Secondary | ICD-10-CM | POA: Diagnosis not present

## 2018-05-15 ENCOUNTER — Other Ambulatory Visit: Payer: Self-pay | Admitting: Primary Care

## 2018-05-15 ENCOUNTER — Other Ambulatory Visit: Payer: Self-pay | Admitting: Endocrinology

## 2018-05-15 DIAGNOSIS — I272 Pulmonary hypertension, unspecified: Secondary | ICD-10-CM | POA: Diagnosis not present

## 2018-05-15 DIAGNOSIS — I110 Hypertensive heart disease with heart failure: Secondary | ICD-10-CM | POA: Diagnosis not present

## 2018-05-15 DIAGNOSIS — J9621 Acute and chronic respiratory failure with hypoxia: Secondary | ICD-10-CM | POA: Diagnosis not present

## 2018-05-15 DIAGNOSIS — I5033 Acute on chronic diastolic (congestive) heart failure: Secondary | ICD-10-CM | POA: Diagnosis not present

## 2018-05-15 DIAGNOSIS — J188 Other pneumonia, unspecified organism: Secondary | ICD-10-CM | POA: Diagnosis not present

## 2018-05-15 DIAGNOSIS — E119 Type 2 diabetes mellitus without complications: Secondary | ICD-10-CM | POA: Diagnosis not present

## 2018-05-15 DIAGNOSIS — J440 Chronic obstructive pulmonary disease with acute lower respiratory infection: Secondary | ICD-10-CM | POA: Diagnosis not present

## 2018-05-15 DIAGNOSIS — J441 Chronic obstructive pulmonary disease with (acute) exacerbation: Secondary | ICD-10-CM | POA: Diagnosis not present

## 2018-05-15 DIAGNOSIS — I2510 Atherosclerotic heart disease of native coronary artery without angina pectoris: Secondary | ICD-10-CM | POA: Diagnosis not present

## 2018-05-16 ENCOUNTER — Ambulatory Visit (INDEPENDENT_AMBULATORY_CARE_PROVIDER_SITE_OTHER): Payer: Medicare HMO | Admitting: Adult Health

## 2018-05-16 ENCOUNTER — Other Ambulatory Visit: Payer: Self-pay

## 2018-05-16 ENCOUNTER — Other Ambulatory Visit: Payer: Medicare HMO

## 2018-05-16 ENCOUNTER — Encounter: Payer: Self-pay | Admitting: Adult Health

## 2018-05-16 VITALS — BP 144/76 | HR 70 | Temp 98.2°F | Ht 67.0 in | Wt 110.0 lb

## 2018-05-16 DIAGNOSIS — I272 Pulmonary hypertension, unspecified: Secondary | ICD-10-CM | POA: Diagnosis not present

## 2018-05-16 DIAGNOSIS — J9611 Chronic respiratory failure with hypoxia: Secondary | ICD-10-CM

## 2018-05-16 DIAGNOSIS — I5042 Chronic combined systolic (congestive) and diastolic (congestive) heart failure: Secondary | ICD-10-CM

## 2018-05-16 DIAGNOSIS — J449 Chronic obstructive pulmonary disease, unspecified: Secondary | ICD-10-CM

## 2018-05-16 DIAGNOSIS — I5032 Chronic diastolic (congestive) heart failure: Secondary | ICD-10-CM

## 2018-05-16 DIAGNOSIS — I509 Heart failure, unspecified: Secondary | ICD-10-CM

## 2018-05-16 HISTORY — DX: Heart failure, unspecified: I50.9

## 2018-05-16 NOTE — Assessment & Plan Note (Signed)
Compensated on present regimen Patient's medications were reviewed today and patient education was given. Computerized medication calendar was adjusted/completed   Plan  Patient Instructions  Elevate legs as much as possible .  Low salt diet .  Restart Lasix 41m daily as cardiology recommended.  Continue Spironolactone .  Continue follow up with Cardiology as planned.  Continue on Oxygen 2l/m .  Follow medication calendar /list closely and bring to each visit.  Follow up with Dr. WMelvyn Novas In 6 weeks and As needed   Please contact office for sooner follow up if symptoms do not improve or worsen or seek emergency care

## 2018-05-16 NOTE — Patient Instructions (Addendum)
Elevate legs as much as possible .  Low salt diet .  Restart Lasix 61m daily as cardiology recommended.  Continue Spironolactone .  Continue follow up with Cardiology as planned.  Continue on Oxygen 2l/m .  Follow medication calendar /list closely and bring to each visit.  Follow up with Dr. WMelvyn Novas In 6 weeks and As needed   Please contact office for sooner follow up if symptoms do not improve or worsen or seek emergency care

## 2018-05-16 NOTE — Progress Notes (Signed)
_0  ID: Denise [name], female    DOB: Apr 24, 1945, 73 y.o.   MRN: [id number]  Chief Complaint  Patient presents with  . Follow-up    COPD     Referring provider: [name], NP  HPI: 73 year old female former smoker (1989) followed for gold 2 COPD and cor pulmonale, pulmonary hypertension and oxygen dependent respiratory failure  TEST/EVENTS :  Spirometry 09/11/2016  FEV1 1.03 (52%)  Ratio 54 mild curvature, on no rx - 09/11/2016  After extensive coaching HFA effectiveness =    50% from baseline 10% > continue  symb 160 2bid but consider change to bevespi or stiolto  - 10/02/2017  After extensive coaching inhaler device,  effectiveness =    50% from a baseline near 0 > rechallenge with symbicort 160 x 2 bid x 2 weeks then re-group  - 10/05/2017    try stiolto x 2pffs x 2 week sample  - PFT's  11/01/2017  FEV1 0.95 (52 % ) ratio fev1/vc = 69%  p 3 % improvement from saba p ? Stiolto(not sure she used it)  prior to study with DLCO  30/30c % corrects to 59 % for alv volume  With air trapping pattern  confirmed on cxr as well -2D echo 2020 showed EF of 45 to 50%, asymmetrical septal hypertrophy, diffuse hypokinesis, diastolic dysfunction -Cardiac cath 2020 showed moderate pulmonary hypertension -2020 myeloma labs was unremarkable.  No evidence of monoclonal protein.  IgG was low at 374.  PYP was not suggestive of amyloid. CT chest March 19, 2018 showed moderate emphysema.  No ILD.   05/16/2018 Follow up : COPD , O2 RF , Pulmonary HTN, , medication review Patient returns for a 2-week follow-up. She underlying COPD and Emphysema . She is on [place] daily . Says overall breathing is slightly better. Gets winded with activity but is able to recover more quickly and activity level is picking up . Appetite is also improved. No flare of cough or wheezing .   Has CHF and Pulmonary HTN . Patient was recently seen by cardiology 1 week ago.  She was restarted back on Lasix 20 mg daily.   Coreg was changed to bisoprolol.  She was to continue on spironolactone 12.5 mg daily. For her pulmonary hypertension.  She was recommended to begin on Tyvaso.  Approval process has been started by cardiology. Says leg swelling is okay , no increase. No weight gain.   She brought all of her medications in today.  We reviewed them in detail with patient education.. Made her a medication calendar and reviewed several times.  She is confused with her medications . Has several duplicate meds . Not taking BIDIL . Misunderstood instructions. Did not restart Lasix as directed by Cardiology .  Patient has several medical providers with multiple changes in therapy , she admits it is confusing trying to understand all the changes .   No Known Allergies  Immunization History  Administered Date(s) Administered  . Influenza, High Dose Seasonal PF 10/18/2016, 12/19/2017  . Influenza,inj,Quad PF,6+ Mos 09/27/2017    Past Medical History:  Diagnosis Date  . Altered mental status   . Anxiety and depression   . Chronic respiratory failure with hypoxia ([place]) 10/02/2017   Assoc with ? Cor pulmonale dx 09/2017 so rx 02 1-2 lpm 24/7  - 10/17/2017  Walked RA x 3 laps @ 185 ft each stopped due to  End of study, nl pace,  desat to 87 on 3rd lap - 12/19/2017  Saturations on Room Air at Rest =91 % and while Ambulating = 87%  But  on  2 Liters of pulsed oxygen while Ambulating =93% so rec POC 2lpm walking / use at rest if sats under 90%      . Colon cancer ([place]) 1988   Resected  . COPD (chronic obstructive pulmonary disease) ([place])   . Diabetes mellitus without complication ([name])    diet controlled- no meds. per pt  . Diverticulosis   . Hyperlipidemia   . Hypertension   . Insomnia   . Primary hyperparathyroidism ([employer])   . Tubular adenoma of rectum 02/23/2013   low grade  . Vitamin D deficiency     Tobacco History: Social History   Tobacco Use  Smoking Status Former Smoker  . Packs/day: 1.00  . Years:  30.00  . Pack years: 30.00  . Types: Cigarettes  . Last attempt to quit: 1989  . Years since quitting: 31.3  Smokeless Tobacco Never Used   Counseling given: Not Answered   Outpatient Medications Prior to Visit  Medication Sig Dispense Refill  . albuterol (VENTOLIN HFA) 108 (90 Base) MCG/ACT inhaler INHALE 1-2 PUFFS INTO THE LUNGS EVERY 4 HOURS AS NEEDED FOR WHEEZING OR SHORTNESS OF BREATH. 6.7 g 5  . aspirin EC 81 MG EC tablet Take 1 tablet (81 mg total) by mouth daily. 30 tablet 0  . atorvastatin (LIPITOR) 10 MG tablet     . bisoprolol (ZEBETA) 5 MG tablet Take 0.5 tablets (2.5 mg total) by mouth daily. 45 tablet 3  . cinacalcet (SENSIPAR) 30 MG tablet TAKE 1 TABLET (30 MG TOTAL) BY MOUTH 3 (THREE) TIMES A WEEK. ($279.56) 13 tablet 2  . famotidine (PEPCID) 40 MG tablet TAKE 1 TABLET BY MOUTH TWICE A DAY (Patient taking differently: Take 40 mg by mouth daily. ) 180 tablet 0  . feeding supplement, ENSURE ENLIVE, (ENSURE ENLIVE) LIQD Take 237 mLs by mouth 2 (two) times daily between meals. 60 Bottle 0  . Fluticasone-Salmeterol (WIXELA INHUB) 250-50 MCG/DOSE AEPB One puff twice daily    . furosemide (LASIX) 20 MG tablet Take 1 tablet (20 mg total) by mouth daily. 90 tablet 3  . isosorbide-hydrALAZINE (BIDIL) 20-37.5 MG tablet Take 1 tablet by mouth 3 (three) times daily. 90 tablet 0  . losartan (COZAAR) 100 MG tablet TAKE 1 TABLET BY MOUTH ONCE DAILY FOR BLOOD PRESSURE 90 tablet 0  . mirtazapine (REMERON) 15 MG tablet TAKE 1 TABLET BY MOUTH AT BEDTIME. FOR DEPRESSION AND APPETITE. 90 tablet 0  . Multiple Vitamin (MULTIVITAMIN WITH MINERALS) TABS tablet Take 1 tablet by mouth daily.    [name] omeprazole (PRILOSEC) 20 MG capsule Take 20 mg by mouth daily.    . OXYGEN Inhale 2 L into the lungs continuous.     . potassium chloride SA (K-DUR,KLOR-CON) 20 MEQ tablet Take 1 tablet (20 mEq total) by mouth 2 (two) times daily. For low potassium. (Patient taking differently: Take 20 mEq by mouth daily. For  low potassium.) 10 tablet 0  . predniSONE (DELTASONE) 10 MG tablet Take 10 mg by mouth daily with breakfast.    . spironolactone (ALDACTONE) 25 MG tablet Take 0.5 tablets (12.5 mg total) by mouth daily. 30 tablet 0   No facility-administered medications prior to visit.      Review of Systems:   Constitutional:   No  weight loss, night sweats,  Fevers, chills,  +fatigue, or  lassitude.  HEENT:   No headaches,  Difficulty swallowing,  Tooth/dental  problems, or  Sore throat,                No sneezing, itching, ear ache, nasal congestion, post nasal drip,   CV:  No chest pain,  Orthopnea, PND, +swelling in lower extremities, No anasarca, dizziness, palpitations, syncope.   GI  No heartburn, indigestion, abdominal pain, nausea, vomiting, diarrhea, change in bowel habits, loss of appetite, bloody stools.   Resp:  .  No wheezing.  No chest wall deformity  Skin: no rash or lesions.  GU: no dysuria, change in color of urine, no urgency or frequency.  No flank pain, no hematuria   MS:  No joint pain or swelling.  No decreased range of motion.  No back pain.    Physical Exam  BP (!) 144/76 (BP Location: Right Arm, Cuff Size: Small)   Pulse 70   Temp 98.2 F (36.8 C) (Oral)   Ht _0  (1.702 m)   Wt 110 lb (49.9 kg)   SpO2 93%   BMI 17.23 kg/m   GEN: A/Ox3; pleasant , NAD, thin , chronically ill appearing on O2    HEENT:  [place]/AT,  EACs-clear, TMs-wnl, NOSE-clear, THROAT-clear, no lesions, no postnasal drip or exudate noted.   NECK:  Supple w/ fair ROM; no JVD; normal carotid impulses w/o bruits; no thyromegaly or nodules palpated; no lymphadenopathy.    RESP decreased breath sounds in the bases   no accessory muscle use, no dullness to percussion  CARD:  RRR, no m/r/g, 1+ peripheral edema, pulses intact, no cyanosis or clubbing.  GI:   Soft & nt; nml bowel sounds; no organomegaly or masses detected.   Musco: Warm bil, no deformities or joint swelling noted.   Neuro: alert,  no focal deficits noted.    Skin: Warm, no lesions or rashes    Lab Results:  CBC    Component Value Date/Time   WBC 4.8 05/02/2018 1249   RBC 4.55 05/02/2018 1249   HGB 13.0 05/02/2018 1249   HCT 38.5 05/02/2018 1249   PLT 206.0 05/02/2018 1249   MCV 84.7 05/02/2018 1249   MCH 26.5 03/20/2018 0348   MCHC 33.7 05/02/2018 1249   RDW 15.8 (H) 05/02/2018 1249   LYMPHSABS 1.2 05/02/2018 1249   MONOABS 0.4 05/02/2018 1249   EOSABS 0.1 05/02/2018 1249   BASOSABS 0.1 05/02/2018 1249    BMET    Component Value Date/Time   NA 143 05/02/2018 1249   K 3.5 05/02/2018 1249   CL 103 05/02/2018 1249   CO2 34 (H) 05/02/2018 1249   GLUCOSE 144 (H) 05/02/2018 1249   BUN 26 (H) 05/02/2018 1249   CREATININE 1.02 05/02/2018 1249   CREATININE 0.84 09/28/2017 1559   CALCIUM 11.0 (H) 05/02/2018 1249   GFRNONAA 51 (L) 03/21/2018 0441   GFRAA 59 (L) 03/21/2018 0441    BNP    Component Value Date/Time   BNP 1,914.0 (H) 03/15/2018 2008    ProBNP    Component Value Date/Time   PROBNP 910.0 (H) 05/02/2018 1249    Imaging: No results found.    PFT Results Latest Ref Rng & Units 11/01/2017  FVC-Pre L 1.35  FVC-Predicted Pre % 57  FVC-Post L 1.25  FVC-Predicted Post % 53  Pre FEV1/FVC % % 68  Post FEV1/FCV % % 76  FEV1-Pre L 0.91  FEV1-Predicted Pre % 50  FEV1-Post L 0.95  DLCO UNC% % 30  DLCO COR %Predicted % 59  TLC L 6.31  TLC % Predicted %  123  RV % Predicted % 220    No results found for: NITRICOXIDE      Assessment & Plan:   COPD GOLD  II   Compensated on present regimen Patient's medications were reviewed today and patient education was given. Computerized medication calendar was adjusted/completed   Plan  Patient Instructions  Elevate legs as much as possible .  Low salt diet .  Restart Lasix 33m daily as cardiology recommended.  Continue Spironolactone .  Continue follow up with Cardiology as planned.  Continue on Oxygen 2l/m .  Follow  medication calendar /list closely and bring to each visit.  Follow up with Dr. WMelvyn Novas In 6 weeks and As needed   Please contact office for sooner follow up if symptoms do not improve or worsen or seek emergency care        Pulmonary hypertension (HThermal c/w cor pulmonale Moderate pulmonary hypertension followed by cardiology.  Currently Tyvaso is pending  She is to restart Lasix as recommended by cardiology Continue on oxygen  Plan  Patient Instructions  Elevate legs as much as possible .  Low salt diet .  Restart Lasix 254mdaily as cardiology recommended.  Continue Spironolactone .  Continue follow up with Cardiology as planned.  Continue on Oxygen 2l/m .  Follow medication calendar /list closely and bring to each visit.  Follow up with Dr. WeMelvyn NovasIn 6 weeks and As needed   Please contact office for sooner follow up if symptoms do not improve or worsen or seek emergency care         Chronic respiratory failure with hypoxia (HCCoaldaleDoing well on oxygen at 2 L.  No increased oxygen demands  Plan  Patient Instructions  Elevate legs as much as possible .  Low salt diet .  Restart Lasix 2048maily as cardiology recommended.  Continue Spironolactone .  Continue follow up with Cardiology as planned.  Continue on Oxygen 2l/m .  Follow medication calendar /list closely and bring to each visit.  Follow up with Dr. WerMelvyn Novasn 6 weeks and As needed   Please contact office for sooner follow up if symptoms do not improve or worsen or seek emergency care        CHF (congestive heart failure) (HCCThree Forksppears currently stable.  Continue on current regimen.  She does need to restart Lasix as recommended by cardiology. Patient also misunderstood instructions for BIDIL  Plan  Patient Instructions  Elevate legs as much as possible .  Low salt diet .  Restart Lasix 46m51mily as cardiology recommended.  Continue Spironolactone .  Continue follow up with Cardiology as planned.  Continue  on Oxygen 2l/m .  Follow medication calendar /list closely and bring to each visit.  Follow up with Dr. WertMelvyn Novas 6 weeks and As needed   Please contact office for sooner follow up if symptoms do not improve or worsen or seek emergency care          TammRexene Edison 05/16/2018

## 2018-05-16 NOTE — Addendum Note (Signed)
Addended by: Jannette Spanner on: 05/16/2018 03:24 PM   Modules accepted: Orders

## 2018-05-16 NOTE — Assessment & Plan Note (Signed)
Doing well on oxygen at 2 L.  No increased oxygen demands  Plan  Patient Instructions  Elevate legs as much as possible .  Low salt diet .  Restart Lasix 91m daily as cardiology recommended.  Continue Spironolactone .  Continue follow up with Cardiology as planned.  Continue on Oxygen 2l/m .  Follow medication calendar /list closely and bring to each visit.  Follow up with Dr. WMelvyn Novas In 6 weeks and As needed   Please contact office for sooner follow up if symptoms do not improve or worsen or seek emergency care

## 2018-05-16 NOTE — Assessment & Plan Note (Signed)
Moderate pulmonary hypertension followed by cardiology.  Currently Tyvaso is pending  She is to restart Lasix as recommended by cardiology Continue on oxygen  Plan  Patient Instructions  Elevate legs as much as possible .  Low salt diet .  Restart Lasix 45m daily as cardiology recommended.  Continue Spironolactone .  Continue follow up with Cardiology as planned.  Continue on Oxygen 2l/m .  Follow medication calendar /list closely and bring to each visit.  Follow up with Dr. WMelvyn Novas In 6 weeks and As needed   Please contact office for sooner follow up if symptoms do not improve or worsen or seek emergency care

## 2018-05-16 NOTE — Addendum Note (Signed)
Addended by: Parke Poisson E on: 05/16/2018 02:34 PM   Modules accepted: Orders

## 2018-05-16 NOTE — Assessment & Plan Note (Signed)
Appears currently stable.  Continue on current regimen.  She does need to restart Lasix as recommended by cardiology. Patient also misunderstood instructions for BIDIL  Plan  Patient Instructions  Elevate legs as much as possible .  Low salt diet .  Restart Lasix 37m daily as cardiology recommended.  Continue Spironolactone .  Continue follow up with Cardiology as planned.  Continue on Oxygen 2l/m .  Follow medication calendar /list closely and bring to each visit.  Follow up with Dr. WMelvyn Novas In 6 weeks and As needed   Please contact office for sooner follow up if symptoms do not improve or worsen or seek emergency care

## 2018-05-17 ENCOUNTER — Other Ambulatory Visit: Payer: Self-pay | Admitting: Internal Medicine

## 2018-05-17 ENCOUNTER — Other Ambulatory Visit: Payer: Self-pay | Admitting: Primary Care

## 2018-05-17 DIAGNOSIS — Z1231 Encounter for screening mammogram for malignant neoplasm of breast: Secondary | ICD-10-CM

## 2018-05-19 ENCOUNTER — Other Ambulatory Visit: Payer: Self-pay | Admitting: Cardiovascular Disease

## 2018-05-20 DIAGNOSIS — E119 Type 2 diabetes mellitus without complications: Secondary | ICD-10-CM | POA: Diagnosis not present

## 2018-05-20 DIAGNOSIS — J188 Other pneumonia, unspecified organism: Secondary | ICD-10-CM | POA: Diagnosis not present

## 2018-05-20 DIAGNOSIS — J440 Chronic obstructive pulmonary disease with acute lower respiratory infection: Secondary | ICD-10-CM | POA: Diagnosis not present

## 2018-05-20 DIAGNOSIS — I5033 Acute on chronic diastolic (congestive) heart failure: Secondary | ICD-10-CM | POA: Diagnosis not present

## 2018-05-20 DIAGNOSIS — J441 Chronic obstructive pulmonary disease with (acute) exacerbation: Secondary | ICD-10-CM | POA: Diagnosis not present

## 2018-05-20 DIAGNOSIS — J9621 Acute and chronic respiratory failure with hypoxia: Secondary | ICD-10-CM | POA: Diagnosis not present

## 2018-05-20 DIAGNOSIS — I272 Pulmonary hypertension, unspecified: Secondary | ICD-10-CM | POA: Diagnosis not present

## 2018-05-20 DIAGNOSIS — I110 Hypertensive heart disease with heart failure: Secondary | ICD-10-CM | POA: Diagnosis not present

## 2018-05-20 DIAGNOSIS — I2510 Atherosclerotic heart disease of native coronary artery without angina pectoris: Secondary | ICD-10-CM | POA: Diagnosis not present

## 2018-05-21 ENCOUNTER — Ambulatory Visit (HOSPITAL_COMMUNITY)
Admission: RE | Admit: 2018-05-21 | Discharge: 2018-05-21 | Disposition: A | Discharge status: Home / Self Care | Payer: Medicare HMO | Source: Ambulatory Visit | Attending: Cardiology | Admitting: Cardiology

## 2018-05-21 ENCOUNTER — Other Ambulatory Visit: Payer: Self-pay

## 2018-05-21 ENCOUNTER — Telehealth: Payer: Self-pay | Admitting: Cardiology

## 2018-05-21 DIAGNOSIS — I272 Pulmonary hypertension, unspecified: Secondary | ICD-10-CM | POA: Insufficient documentation

## 2018-05-21 LAB — BASIC METABOLIC PANEL
Anion gap: 12 (ref 5–15)
BUN: 17 mg/dL (ref 8–23)
CO2: 27 mmol/L (ref 22–32)
Calcium: 10.4 mg/dL — ABNORMAL HIGH (ref 8.9–10.3)
Chloride: 105 mmol/L (ref 98–111)
Creatinine, Ser: 1 mg/dL (ref 0.44–1.00)
GFR calc Af Amer: >60 mL/min (ref >60)
GFR calc non Af Amer: 56 mL/min — ABNORMAL LOW (ref >60)
Glucose, Bld: 162 mg/dL — ABNORMAL HIGH (ref 70–99)
Potassium: 3.1 mmol/L — ABNORMAL LOW (ref 3.5–5.1)
Sodium: 144 mmol/L (ref 135–145)

## 2018-05-21 NOTE — Progress Notes (Signed)
Virtual Visit via Telephone Note   This visit type was conducted due to national recommendations for restrictions regarding the COVID-19 Pandemic (e.g. social distancing) in an effort to limit this patient's exposure and mitigate transmission in our community.  Due to her co-morbid illnesses, this patient is at least at moderate risk for complications without adequate follow up.  This format is felt to be most appropriate for this patient at this time.  The patient did not have access to video technology/had technical difficulties with video requiring transitioning to audio format only (telephone).  All issues noted in this document were discussed and addressed.  No physical exam could be performed with this format.  Please refer to the patient's chart for her  consent to telehealth for [facility].   Date:  05/23/2018   ID:  Denise [name], Denise [name] [birth year], MRN [id number]  Patient Location: Home Provider Location: Home  PCP:  [name], NP  Cardiologist:  Dr. Geraldine Solar   Evaluation Performed:  Follow-Up Visit  Chief Complaint:  Follow up CHF, seen for Dr. Johnsie Cancel   History of Present Illness:    Denise [name] is a 73 y.o. female with a prior history of severe COPD on home oxygen, pulmonary hypertension, suspected hypertrophic cardiomyopathy, HLD, DM 2, colon cancer status post resection, hyperparathyroidism pending surgery and hypertension.  Admitted 09/16/17 - 09/24/17 for acute on chronic hypoxic respiratory failure. RHC at that time by Dr. Aundra Dubin showed significant pulmonary HTN, thought to be most consistent with WHO group 3/COPD. Her hospital course complicated by abdominal pain in which a GI work up was complete with no clear cause.  She was seen by Dr. Melvyn Novas 11/01/17. PFTs performed with significant COPD.  She was then hospitalized in 3/20 with PNA/COPD and CHF exacerbation. She was treated with antibiotics and diuresed. RHC/LHC showed no coronary  disease, normal filling pressures, moderate pulmonary hypertension. V/Q scan showed no chronic PE.  High resolution CT showed moderate COPD, no interstitial lung disease. Echo was concerning for hypertrophic CMP, so cardiac MRI was done. LV EF 38%, moderate focal basal septal hypertrophy, mid-wall LGE in a noncoronary pattern suggestive of HCM.   She was last seen via virtual visit on 05/07/2018 by Dr. Aundra Dubin. At that time she continued to lose weight. She had stopped Lasix and was only taking Coreg once per day. She continue to wear home oxygen 2 L nasal cannula however still had concerns with dyspnea.  She was restarted on Lasix 20 mg daily with plans for repeat labs after 1 week.  Her carvedilol was stopped in the setting of significant COPD and she was started on bisoprolol 2.5 mg daily. She was continued on losartan 100 mg with plans to possibly transition to [place].  She was also continued on spironolactone 12.5 mg daily as well as BiDil 1 tab 3 times daily.  Today she presents via virtual visit and reports she is feeling good.  Reports that she is "plugging along ".  Her breathing sounds good during our visit.  Her weight is down to 107lb (from 110lb).  She states that Dr. Melvyn Novas asked her to take her Lasix 20 mg as needed for increased swelling or weight gain.  I have asked that they monitor this very closely but am okay with a PRN dose given her improved breathing as well as weight loss.  I have educated both patient and her husband on when to take this.  Her BP is stable today at 130/80.  Her potassium was found to be low after her last office visit with Dr. Aundra Dubin at 3.1.  She was prescribed KCl 20 mEq daily.  She reports taking this.  She has follow-up lab work next week.  She denies anginal symptoms, stable dyspnea which she reports is actually improved from her last office visit.  She denies palpitations, LE swelling orthopnea symptoms.  She continues to wear her home portable oxygen 2 L, continuous.   She has no specific complaints today.   The patient does not have symptoms concerning for COVID-19 infection (fever, chills, cough, or new shortness of breath).   Past Medical History:  Diagnosis Date   Altered mental status    Anxiety and depression    Chronic respiratory failure with hypoxia ([place]) 10/02/2017   Assoc with ? Cor pulmonale dx 09/2017 so rx 02 1-2 lpm 24/7  - 10/17/2017  Walked RA x 3 laps @ 185 ft each stopped due to  End of study, nl pace,  desat to 87 on 3rd lap - 12/19/2017  Saturations on Room Air at Rest =91 % and while Ambulating = 87%  But  on  2 Liters of pulsed oxygen while Ambulating =93% so rec POC 2lpm walking / use at rest if sats under 90%       Colon cancer ([place]) 1988   Resected   COPD (chronic obstructive pulmonary disease) ([place])    Diabetes mellitus without complication ([place])    diet controlled- no meds. per pt   Diverticulosis    Hyperlipidemia    Hypertension    Insomnia    Primary hyperparathyroidism ([place])    Tubular adenoma of rectum 02/23/2013   low grade   Vitamin D deficiency    Past Surgical History:  Procedure Laterality Date   ABDOMINAL HYSTERECTOMY  08/2015   BREAST BIOPSY Left    BREAST EXCISIONAL BIOPSY Right    COLON RESECTION  1980's   COLONOSCOPY     ENDOMETRIAL BIOPSY  2009   negative   INCONTINENCE SURGERY  2017   RIGHT HEART CATH N/A 09/20/2017   Procedure: RIGHT HEART CATH;  Surgeon: Larey Dresser, MD;  Location: [place] [facility];  Service: Cardiovascular;  Laterality: N/A;   RIGHT/LEFT HEART CATH AND CORONARY ANGIOGRAPHY N/A 03/19/2018   Procedure: RIGHT/LEFT HEART CATH AND CORONARY ANGIOGRAPHY;  Surgeon: Larey Dresser, MD;  Location: [place] [facility];  Service: Cardiovascular;  Laterality: N/A;     Current Meds  Medication Sig   albuterol (VENTOLIN HFA) 108 (90 Base) MCG/ACT inhaler INHALE 1-2 PUFFS INTO THE LUNGS EVERY 4 HOURS AS NEEDED FOR WHEEZING OR SHORTNESS OF BREATH.   aspirin EC  81 MG EC tablet Take 1 tablet (81 mg total) by mouth daily.   atorvastatin (LIPITOR) 10 MG tablet Take 10 mg by mouth at bedtime.    BIDIL 20-37.5 MG tablet TAKE 1 TABLET BY MOUTH THREE TIMES A DAY   bisoprolol (ZEBETA) 5 MG tablet Take 0.5 tablets (2.5 mg total) by mouth daily.   cinacalcet (SENSIPAR) 30 MG tablet TAKE 1 TABLET (30 MG TOTAL) BY MOUTH 3 (THREE) TIMES A WEEK. ($279.56)   famotidine (PEPCID) 40 MG tablet TAKE 1 TABLET BY MOUTH TWICE A DAY (Patient taking differently: Take 40 mg by mouth daily. )   Fluticasone-Salmeterol (WIXELA INHUB) 250-50 MCG/DOSE AEPB One puff twice daily   furosemide (LASIX) 20 MG tablet Take 1 tablet (20 mg total) by mouth daily.   losartan (COZAAR) 100 MG tablet TAKE  1 TABLET BY MOUTH ONCE DAILY FOR BLOOD PRESSURE   mirtazapine (REMERON) 15 MG tablet TAKE 1 TABLET BY MOUTH AT BEDTIME. FOR DEPRESSION AND APPETITE.   OXYGEN Inhale 2 L into the lungs continuous.    potassium chloride SA (K-DUR) 10 MEQ tablet Take 2 tablets (20 mEq total) by mouth daily. For low potassium.   spironolactone (ALDACTONE) 25 MG tablet Take 0.5 tablets (12.5 mg total) by mouth daily.     Allergies:   Patient has no known allergies.   Social History   Tobacco Use   Smoking status: Former Smoker    Packs/day: 1.00    Years: 30.00    Pack years: 30.00    Types: Cigarettes    Last attempt to quit: 1989    Years since quitting: 31.4   Smokeless tobacco: Never Used  Substance Use Topics   Alcohol use: No   Drug use: No     Family Hx: The patient's family history includes Ovarian cancer in her mother. There is no history of Breast cancer, Hyperparathyroidism, Colon cancer, Esophageal cancer, or Stomach cancer.  ROS:   Please see the history of present illness.     All other systems reviewed and are negative.  Prior CV studies:   The following studies were reviewed today:  Echocardiogram: 09/18/17 Study Conclusions  - Left ventricle: The cavity size  was normal. There was moderate septal hypertrophy with otherwise mild concentric hypertrophy. Systolic function was normal. The estimated ejection fraction was in the range of 60% to 65%. There was dynamic obstruction at rest, with a peak velocity of 189 cm/sec and a peak gradient of 14 mm Hg. Wall motion was normal; there were no regional wall motion abnormalities. Doppler parameters are consistent with abnormal left ventricular relaxation (grade 1 diastolic dysfunction). - Aortic valve: Transvalvular velocity was within the normal range. There was no stenosis. There was mild regurgitation. - Mitral valve: Transvalvular velocity was within the normal range. There was no evidence for stenosis. There was trivial regurgitation. - Right ventricle: The cavity size was normal. Wall thickness was normal. Systolic function was normal. - Atrial septum: No defect or patent foramen ovale was identified by color flow Doppler. - Tricuspid valve: There was mild regurgitation. - Pulmonary arteries: Systolic pressure was severely increased. PA peak pressure: 69 mm Hg (S).   RIGHT HEART CATH  09/20/17  Conclusion   1. Normal right and left heart filling pressures.  2. Moderate pulmonary arterial hypertension with very high PVR.  3. Low cardiac output.   Significant PAH. This may be group 3 PH in setting of COPD given history. V/Q scan was not suggestive of chronic PEs. She needs PFTs to see how severe COPD is. If pulmonary hypertension looks out of proportion to severity of COPD, would consider treatment.   I am not sure that cardiac output as obtained is correct. Oxygen saturation was up and down in the cath lab, we used a pulse ox saturation. It is possible saturation was truly lower and CO higher.    Labs/Other Tests and Data Reviewed:    EKG:  An ECG dated 03/20/2018 was personally reviewed today and demonstrated:  NSR with PVC, PAC wiht TWI in inferior  and lateral leads   Recent Labs: 03/15/2018: ALT 24; B Natriuretic Peptide 1,914.0 03/19/2018: Magnesium 1.8 05/02/2018: Hemoglobin 13.0; Platelets 206.0; Pro B Natriuretic peptide (BNP) 910.0; TSH 0.65 05/21/2018: BUN 17; Creatinine, Ser 1.00; Potassium 3.1; Sodium 144   Recent Lipid Panel Lab Results  Component Value Date/Time   CHOL 209 (H) 03/19/2018 04:33 AM   TRIG 127 03/19/2018 04:33 AM   HDL 46 03/19/2018 04:33 AM   CHOLHDL 4.5 03/19/2018 04:33 AM   LDLCALC 138 (H) 03/19/2018 04:33 AM   Wt Readings from Last 3 Encounters:  05/23/18 107 lb 12.8 oz (48.9 kg)  05/16/18 110 lb (49.9 kg)  05/02/18 108 lb 3.2 oz (49.1 kg)    Objective:    Vital Signs:  BP 130/80    Pulse 63    Ht _0  (1.676 m)    Wt 107 lb 12.8 oz (48.9 kg)    BMI 17.40 kg/m    VITAL SIGNS:  reviewed GEN:  no acute distress RESPIRATORY:  normal respiratory effort NEURO:  alert and oriented x 3 PSYCH:  normal affect  ASSESSMENT & PLAN:    1.  Chronic systolic CHF: -Last seen via virtual visit by Dr. Aundra Dubin 05/07/2018. -Echo 3/20 with EF 45%, asymmetric septal hypertrophy, moderate RV systolic dysfunction. -RHC/LHC was done in 3/20, showing no significant CAD and moderate pulmonary arterial hypertension.  -Cardiac MRI showed LVEF 38%, moderate basal septal hypertrophy, RV EF 30%, mid-wall LGE in the basal anteroseptal wall and lesser extent in mid lateral wall. Most concerning for hypertrophic CMP and less likely cardiac amyloidosis.   -PYP scan was negative for evidence of transthyretin amyloidosis and SPEP negative.  -Medication adjustments as above -Weight, -BNP remains elevated 04/22/2018 at 910 -Follow up labs on 05/21/2018 with hypokalemia at 3.1, creatinine was stable at 1.00 -KCL added to regimen with plans for repeat BMET in one week -  2.  Hypertrophic cardiomyopathy: -Imaging concerning for HCM without LVOT or SAM.  -Followed by Dr. Doralee Albino appointment 06/06/2018   3.  Pulmonary  hypertension: -Last RHC showed moderate pulmonary arterial hypertension. -CT chest with no ILD, there was moderate emphysema thought to be out of proportion for degree of lung disease -Per Dr. Aundra Dubin would start with Tyvaso to try to avoid V/Q mismatching given moderate emphysema>>>per Dr. Aundra Dubin -Continues with supplemental home O2>>tolerating   4. HTN:  -Continue bisoprolol 2.5, losartan 100>>>plan for Entresto transition per Dr. Aundra Dubin  -Continue Spiro 12.5, Bidil 20-37.5  5. HLD: -Last LDL, 138 -Continue atorvastatin 10    COVID-19 Education: The signs and symptoms of COVID-19 were discussed with the patient and how to seek care for testing (follow up with PCP or arrange E-visit). The importance of social distancing was discussed today.  Time:   Today, I have spent 20 minutes with the patient with telehealth technology discussing the above problems.     Medication Adjustments/Labs and Tests Ordered: Current medicines are reviewed at length with the patient today.  Concerns regarding medicines are outlined above.   Tests Ordered: No orders of the defined types were placed in this encounter.   Medication Changes: No orders of the defined types were placed in this encounter.   Disposition:  Follow up Follow-up with Dr. Johnsie Cancel in 6 months or sooner if needed   Signed, Kathyrn Drown, NP  05/23/2018 10:24 AM    [place]

## 2018-05-21 NOTE — Telephone Encounter (Signed)
Spoke with patient who confirmed all demographics. Patient does not have a smart phone. Link for My Chart was sent to patient. Will have vitals ready for visit.    Virtual Visit Pre-Appointment Phone Call  "(Name), I am calling you today to discuss your upcoming appointment. We are currently trying to limit exposure to the virus that causes COVID-19 by seeing patients at home rather than in the office."  1. "What is the BEST phone number to call the day of the visit?" - include this in appointment notes  2. Do you have or have access to (through a family member/friend) a smartphone with video capability that we can use for your visit?" a. If yes - list this number in appt notes as cell (if different from BEST phone #) and list the appointment type as a VIDEO visit in appointment notes b. If no - list the appointment type as a PHONE visit in appointment notes  Confirm consent - "In the setting of the current Covid19 crisis, you are scheduled for a (phone or video) visit with your provider on (date) at (time).  Just as we do with many in-office visits, in order for you to participate in this visit, we must obtain consent.  If you'd like, I can send this to your mychart (if signed up) or email for you to review.  Otherwise, I can obtain your verbal consent now.  All virtual visits are billed to your insurance company just like a normal visit would be.  By agreeing to a virtual visit, we'd like you to understand that the technology does not allow for your provider to perform an examination, and thus may limit your provider's ability to fully assess your condition. If your provider identifies any concerns that need to be evaluated in person, we will make arrangements to do so.  Finally, though the technology is pretty good, we cannot assure that it will always work on either your or our end, and in the setting of a video visit, we may have to convert it to a phone-only visit.  In either situation, we  cannot ensure that we have a secure connection.  Are you willing to proceed?" Patient said "yes".  3. Advise patient to be prepared - "Two hours prior to your appointment, go ahead and check your blood pressure, pulse, oxygen saturation, and your weight (if you have the equipment to check those) and write them all down. When your visit starts, your provider will ask you for this information. If you have an Apple Watch or Kardia device, please plan to have heart rate information ready on the day of your appointment. Please have a pen and paper handy nearby the day of the visit as well."  4. Give patient instructions for MyChart download to smartphone OR [email or web address] as below if video visit (depending on what platform provider is using)  5. Inform patient they will receive a phone call 15 minutes prior to their appointment time (may be from unknown caller ID) so they should be prepared to answer    TELEPHONE CALL NOTE  Enid Derry A. Fennelly has been deemed a candidate for a follow-up tele-health visit to limit community exposure during the Covid-19 pandemic. I spoke with the patient via phone to ensure availability of phone/video source, confirm preferred email & phone number, and discuss instructions and expectations.  I reminded Braylinn A. Carrington to be prepared with any vital sign and/or heart rhythm information that could potentially be obtained via home monitoring, at  the time of her visit. I reminded Sharol A. Polhemus to expect a phone call prior to her visit.  Ardelle Anton 05/21/2018 10:52 AM   INSTRUCTIONS FOR DOWNLOADING THE MYCHART APP TO SMARTPHONE  - The patient must first make sure to have activated MyChart and know their login information - If Apple, go to [employer] and type in MyChart in the search bar and download the app. If Android, ask patient to go to [employer] and type in [place] in the search bar and download the app. The app is free but as with any  other app downloads, their phone may require them to verify saved payment information or Apple/Android password.  - The patient will need to then log into the app with their MyChart username and password, and select [facility] as their healthcare provider to link the account. When it is time for your visit, go to the MyChart app, find appointments, and click Begin Video Visit. Be sure to Select Allow for your device to access the Microphone and Camera for your visit. You will then be connected, and your provider will be with you shortly.  **If they have any issues connecting, or need assistance please contact MyChart service desk [phone])**  **If using a computer, in order to ensure the best quality for their visit they will need to use either of the following Internet Browsers: [employer], or Google Chrome**  IF USING DOXIMITY or DOXY.ME - The patient will receive a link just prior to their visit by text.     FULL LENGTH CONSENT FOR TELE-HEALTH VISIT   I hereby voluntarily request, consent and authorize [facility] and its employed or contracted physicians, physician assistants, nurse practitioners or other licensed health care professionals (the Practitioner), to provide me with telemedicine health care services (the Services") as deemed necessary by the treating Practitioner. I acknowledge and consent to receive the Services by the Practitioner via telemedicine. I understand that the telemedicine visit will involve communicating with the Practitioner through live audiovisual communication technology and the disclosure of certain medical information by electronic transmission. I acknowledge that I have been given the opportunity to request an in-person assessment or other available alternative prior to the telemedicine visit and am voluntarily participating in the telemedicine visit.  I understand that I have the right to withhold or withdraw my consent to the use of  telemedicine in the course of my care at any time, without affecting my right to future care or treatment, and that the Practitioner or I may terminate the telemedicine visit at any time. I understand that I have the right to inspect all information obtained and/or recorded in the course of the telemedicine visit and may receive copies of available information for a reasonable fee.  I understand that some of the potential risks of receiving the Services via telemedicine include:   Delay or interruption in medical evaluation due to technological equipment failure or disruption;  Information transmitted may not be sufficient (e.g. poor resolution of images) to allow for appropriate medical decision making by the Practitioner; and/or   In rare instances, security protocols could fail, causing a breach of personal health information.  Furthermore, I acknowledge that it is my responsibility to provide information about my medical history, conditions and care that is complete and accurate to the best of my ability. I acknowledge that Practitioner's advice, recommendations, and/or decision may be based on factors not within their control, such as incomplete or inaccurate data  provided by me or distortions of diagnostic images or specimens that may result from electronic transmissions. I understand that the practice of medicine is not an exact science and that Practitioner makes no warranties or guarantees regarding treatment outcomes. I acknowledge that I will receive a copy of this consent concurrently upon execution via email to the email address I last provided but may also request a printed copy by calling the office of [facility].    I understand that my insurance will be billed for this visit.   I have read or had this consent read to me.  I understand the contents of this consent, which adequately explains the benefits and risks of the Services being provided via telemedicine.   I have been  provided ample opportunity to ask questions regarding this consent and the Services and have had my questions answered to my satisfaction.  I give my informed consent for the services to be provided through the use of telemedicine in my medical care  By participating in this telemedicine visit I agree to the above.

## 2018-05-22 ENCOUNTER — Telehealth (HOSPITAL_COMMUNITY): Payer: Self-pay | Admitting: Cardiology

## 2018-05-22 DIAGNOSIS — E876 Hypokalemia: Secondary | ICD-10-CM

## 2018-05-22 MED ORDER — POTASSIUM CHLORIDE CRYS ER 10 MEQ PO TBCR: 20.0000 meq | EXTENDED_RELEASE_TABLET | Freq: Every day | ORAL | 3 refills | Status: DC

## 2018-05-22 NOTE — Telephone Encounter (Signed)
Pt aware voiced understanding, reports she has not been taking any potassium supplementation, will restart KCL 20 meq daily repeat labs x 1 week ------  Notes recorded by Valeda Malm, RN on 05/21/2018 at 4:00 PM EDT LM for patient to return call regarding lab results ------  Notes recorded by Larey Dresser, MD on 05/21/2018 at 3:41 PM EDT Increase total daily KCl by 20 mEq daily, repeat BMET 10 days.

## 2018-05-23 ENCOUNTER — Telehealth: Payer: Self-pay

## 2018-05-23 ENCOUNTER — Encounter: Payer: Self-pay | Admitting: Cardiology

## 2018-05-23 ENCOUNTER — Telehealth: Payer: Self-pay | Admitting: Internal Medicine

## 2018-05-23 ENCOUNTER — Telehealth (INDEPENDENT_AMBULATORY_CARE_PROVIDER_SITE_OTHER): Payer: Medicare HMO | Admitting: Cardiology

## 2018-05-23 ENCOUNTER — Other Ambulatory Visit: Payer: Self-pay

## 2018-05-23 VITALS — BP 130/80 | HR 63 | Ht 66.0 in | Wt 107.8 lb

## 2018-05-23 DIAGNOSIS — I5033 Acute on chronic diastolic (congestive) heart failure: Secondary | ICD-10-CM | POA: Diagnosis not present

## 2018-05-23 DIAGNOSIS — I272 Pulmonary hypertension, unspecified: Secondary | ICD-10-CM | POA: Diagnosis not present

## 2018-05-23 DIAGNOSIS — J188 Other pneumonia, unspecified organism: Secondary | ICD-10-CM | POA: Diagnosis not present

## 2018-05-23 DIAGNOSIS — I110 Hypertensive heart disease with heart failure: Secondary | ICD-10-CM | POA: Diagnosis not present

## 2018-05-23 DIAGNOSIS — J9621 Acute and chronic respiratory failure with hypoxia: Secondary | ICD-10-CM | POA: Diagnosis not present

## 2018-05-23 DIAGNOSIS — I5022 Chronic systolic (congestive) heart failure: Secondary | ICD-10-CM

## 2018-05-23 DIAGNOSIS — J440 Chronic obstructive pulmonary disease with acute lower respiratory infection: Secondary | ICD-10-CM | POA: Diagnosis not present

## 2018-05-23 DIAGNOSIS — E876 Hypokalemia: Secondary | ICD-10-CM

## 2018-05-23 DIAGNOSIS — I2510 Atherosclerotic heart disease of native coronary artery without angina pectoris: Secondary | ICD-10-CM | POA: Diagnosis not present

## 2018-05-23 DIAGNOSIS — E7841 Elevated Lipoprotein(a): Secondary | ICD-10-CM

## 2018-05-23 DIAGNOSIS — J441 Chronic obstructive pulmonary disease with (acute) exacerbation: Secondary | ICD-10-CM | POA: Diagnosis not present

## 2018-05-23 DIAGNOSIS — E119 Type 2 diabetes mellitus without complications: Secondary | ICD-10-CM | POA: Diagnosis not present

## 2018-05-23 DIAGNOSIS — I10 Essential (primary) hypertension: Secondary | ICD-10-CM

## 2018-05-23 NOTE — Telephone Encounter (Signed)
Approved.  

## 2018-05-23 NOTE — Patient Instructions (Signed)
Medication Instructions:  Your physician recommends that you continue on your current medications as directed. Please refer to the Current Medication list given to you today.  If you need a refill on your cardiac medications before your next appointment, please call your pharmacy.   Lab work: None  If you have labs (blood work) drawn today and your tests are completely normal, you will receive your results only by: [name] MyChart Message (if you have MyChart) OR . A paper copy in the mail If you have any lab test that is abnormal or we need to change your treatment, we will call you to review the results.  Testing/Procedures: None  Follow-Up: At [facility], you and your health needs are our priority.  As part of our continuing mission to provide you with exceptional heart care, we have created designated Provider Care Teams.  These Care Teams include your primary Cardiologist (physician) and Advanced Practice Providers (APPs -  Physician Assistants and Nurse Practitioners) who all work together to provide you with the care you need, when you need it. You will need a follow up appointment in 6 months.  Please call our office 2 months in advance to schedule this appointment.  You may see Dr. Johnsie Cancel or one of the following Advanced Practice Providers on your designated Care Team:   Truitt Merle, NP Cecilie Kicks, NP . Kathyrn Drown, NP  Any Other Special Instructions Will Be Listed Below (If Applicable).

## 2018-05-23 NOTE — Telephone Encounter (Signed)
[place] with [facility] at home left v/m requesting additional [facility] PT orders 1 x a wk for 4 wks; pt is doing well but wants to work on improvement of cardiovascular endurance, balance and safety in walking outside.

## 2018-05-23 NOTE — Telephone Encounter (Signed)
Spoke with one of the pharmacy techs. Advised her of MW's recs. She stated that she was not sure why these requests were sent to our office but she would delete MW's name from the request.   Nothing further needed at time of call.

## 2018-05-23 NOTE — Telephone Encounter (Signed)
Call returned to [facility], made aware I would check with Dr. Melvyn Novas to see if we could refill these medications as it appears cardiology is managing her furosemide and glipizide appears to have been d/c by an other office.   MW please advise if willing to refill furosemide or do we need to defer to cardiology and if we can refill patients glipizide. Thanks.

## 2018-05-23 NOTE — Telephone Encounter (Signed)
Cards doing the furosemide pcp doing the glipizide

## 2018-05-24 ENCOUNTER — Telehealth: Payer: Self-pay | Admitting: *Deleted

## 2018-05-24 DIAGNOSIS — J441 Chronic obstructive pulmonary disease with (acute) exacerbation: Secondary | ICD-10-CM | POA: Diagnosis not present

## 2018-05-24 DIAGNOSIS — J440 Chronic obstructive pulmonary disease with acute lower respiratory infection: Secondary | ICD-10-CM | POA: Diagnosis not present

## 2018-05-24 DIAGNOSIS — I272 Pulmonary hypertension, unspecified: Secondary | ICD-10-CM | POA: Diagnosis not present

## 2018-05-24 DIAGNOSIS — E785 Hyperlipidemia, unspecified: Secondary | ICD-10-CM

## 2018-05-24 DIAGNOSIS — I2510 Atherosclerotic heart disease of native coronary artery without angina pectoris: Secondary | ICD-10-CM | POA: Diagnosis not present

## 2018-05-24 DIAGNOSIS — I082 Rheumatic disorders of both aortic and tricuspid valves: Secondary | ICD-10-CM

## 2018-05-24 DIAGNOSIS — F329 Major depressive disorder, single episode, unspecified: Secondary | ICD-10-CM

## 2018-05-24 DIAGNOSIS — F419 Anxiety disorder, unspecified: Secondary | ICD-10-CM

## 2018-05-24 DIAGNOSIS — I5033 Acute on chronic diastolic (congestive) heart failure: Secondary | ICD-10-CM | POA: Diagnosis not present

## 2018-05-24 DIAGNOSIS — Z87891 Personal history of nicotine dependence: Secondary | ICD-10-CM

## 2018-05-24 DIAGNOSIS — E119 Type 2 diabetes mellitus without complications: Secondary | ICD-10-CM | POA: Diagnosis not present

## 2018-05-24 DIAGNOSIS — Z9181 History of falling: Secondary | ICD-10-CM

## 2018-05-24 DIAGNOSIS — J9621 Acute and chronic respiratory failure with hypoxia: Secondary | ICD-10-CM | POA: Diagnosis not present

## 2018-05-24 DIAGNOSIS — E43 Unspecified severe protein-calorie malnutrition: Secondary | ICD-10-CM

## 2018-05-24 DIAGNOSIS — Z9981 Dependence on supplemental oxygen: Secondary | ICD-10-CM

## 2018-05-24 DIAGNOSIS — E559 Vitamin D deficiency, unspecified: Secondary | ICD-10-CM

## 2018-05-24 DIAGNOSIS — J188 Other pneumonia, unspecified organism: Secondary | ICD-10-CM | POA: Diagnosis not present

## 2018-05-24 DIAGNOSIS — G4700 Insomnia, unspecified: Secondary | ICD-10-CM

## 2018-05-24 DIAGNOSIS — Z7951 Long term (current) use of inhaled steroids: Secondary | ICD-10-CM

## 2018-05-24 DIAGNOSIS — Z85038 Personal history of other malignant neoplasm of large intestine: Secondary | ICD-10-CM

## 2018-05-24 DIAGNOSIS — K5790 Diverticulosis of intestine, part unspecified, without perforation or abscess without bleeding: Secondary | ICD-10-CM

## 2018-05-24 DIAGNOSIS — Z7982 Long term (current) use of aspirin: Secondary | ICD-10-CM

## 2018-05-24 DIAGNOSIS — I110 Hypertensive heart disease with heart failure: Secondary | ICD-10-CM | POA: Diagnosis not present

## 2018-05-24 DIAGNOSIS — Z7984 Long term (current) use of oral hypoglycemic drugs: Secondary | ICD-10-CM

## 2018-05-24 DIAGNOSIS — Z86010 Personal history of colonic polyps: Secondary | ICD-10-CM

## 2018-05-24 NOTE — Telephone Encounter (Signed)
[employer] left a voicemail requesting refills on several medications. Please see form faxed from [facility] regarding the refills needed.

## 2018-05-24 NOTE — Telephone Encounter (Signed)
Gave the approval the verbal orders.

## 2018-05-28 ENCOUNTER — Ambulatory Visit: Payer: Medicare HMO | Admitting: Endocrinology

## 2018-05-28 DIAGNOSIS — J440 Chronic obstructive pulmonary disease with acute lower respiratory infection: Secondary | ICD-10-CM | POA: Diagnosis not present

## 2018-05-28 DIAGNOSIS — J9621 Acute and chronic respiratory failure with hypoxia: Secondary | ICD-10-CM | POA: Diagnosis not present

## 2018-05-28 DIAGNOSIS — I2510 Atherosclerotic heart disease of native coronary artery without angina pectoris: Secondary | ICD-10-CM | POA: Diagnosis not present

## 2018-05-28 DIAGNOSIS — J441 Chronic obstructive pulmonary disease with (acute) exacerbation: Secondary | ICD-10-CM | POA: Diagnosis not present

## 2018-05-28 DIAGNOSIS — I110 Hypertensive heart disease with heart failure: Secondary | ICD-10-CM | POA: Diagnosis not present

## 2018-05-28 DIAGNOSIS — E119 Type 2 diabetes mellitus without complications: Secondary | ICD-10-CM | POA: Diagnosis not present

## 2018-05-28 DIAGNOSIS — I272 Pulmonary hypertension, unspecified: Secondary | ICD-10-CM | POA: Diagnosis not present

## 2018-05-28 DIAGNOSIS — I5033 Acute on chronic diastolic (congestive) heart failure: Secondary | ICD-10-CM | POA: Diagnosis not present

## 2018-05-28 DIAGNOSIS — J188 Other pneumonia, unspecified organism: Secondary | ICD-10-CM | POA: Diagnosis not present

## 2018-05-28 MED ORDER — GLUCOSE BLOOD VI STRP: ORAL_STRIP | 1 refills | Status: DC

## 2018-05-28 MED ORDER — ACCU-CHEK SOFT TOUCH LANCETS MISC: 1 refills | Status: DC

## 2018-05-28 MED ORDER — MIRTAZAPINE 15 MG PO TABS: ORAL_TABLET | ORAL | 0 refills | Status: DC

## 2018-05-28 MED ORDER — ACCU-CHEK AVIVA PLUS W/DEVICE KIT: PACK | 0 refills | Status: DC

## 2018-05-28 MED ORDER — ACCU-CHEK AVIVA VI SOLN: 2 refills | Status: DC

## 2018-05-28 NOTE — Telephone Encounter (Signed)
Refills that [name] prescribed for patient have been sent.

## 2018-05-29 ENCOUNTER — Other Ambulatory Visit: Payer: Self-pay

## 2018-05-29 ENCOUNTER — Ambulatory Visit (INDEPENDENT_AMBULATORY_CARE_PROVIDER_SITE_OTHER): Payer: Medicare HMO | Admitting: Endocrinology

## 2018-05-29 DIAGNOSIS — E119 Type 2 diabetes mellitus without complications: Secondary | ICD-10-CM

## 2018-05-29 DIAGNOSIS — J9611 Chronic respiratory failure with hypoxia: Secondary | ICD-10-CM | POA: Diagnosis not present

## 2018-05-29 MED ORDER — CINACALCET HCL 30 MG PO TABS: ORAL_TABLET | ORAL | 11 refills | Status: DC

## 2018-05-29 NOTE — Progress Notes (Signed)
Subjective:    Patient ID: Denise [name], female    DOB: 1945-05-03, 73 y.o.   MRN: [id number]  HPI  telehealth visit today via phone x 7 minutes Alternatives to telehealth are presented to this patient, and the patient agrees to the telehealth visit. Pt is advised of the cost of the visit, and agrees to this, also.   Patient is at home, and I am at the office.   Persons attending the telehealth visit: the patient and I Pt returns for f/u of primary hyperparathyroidism (dx'ed 2017; she has never had osteoporosis, urolithiasis, or bony fracture; sestamibi scan in 2019 did not locate a parathyroid adenoma, but suggested MNG at the neck; surg decided she was not an operative candidate). She is not sure if she takes sensipar or not.  No change in chronic dyspnea.   Past Medical History:  Diagnosis Date  . Altered mental status   . Anxiety and depression   . Chronic respiratory failure with hypoxia ([place]) 10/02/2017   Assoc with ? Cor pulmonale dx 09/2017 so rx 02 1-2 lpm 24/7  - 10/17/2017  Walked RA x 3 laps @ 185 ft each stopped due to  End of study, nl pace,  desat to 87 on 3rd lap - 12/19/2017  Saturations on Room Air at Rest =91 % and while Ambulating = 87%  But  on  2 Liters of pulsed oxygen while Ambulating =93% so rec POC 2lpm walking / use at rest if sats under 90%      . Colon cancer ([place]) 1988   Resected  . COPD (chronic obstructive pulmonary disease) ([place])   . Diabetes mellitus without complication ([name])    diet controlled- no meds. per pt  . Diverticulosis   . Hyperlipidemia   . Hypertension   . Insomnia   . Primary hyperparathyroidism ([place])   . Tubular adenoma of rectum 02/23/2013   low grade  . Vitamin D deficiency     Past Surgical History:  Procedure Laterality Date  . ABDOMINAL HYSTERECTOMY  08/2015  . BREAST BIOPSY Left   . BREAST EXCISIONAL BIOPSY Right   . COLON RESECTION  1980's  . COLONOSCOPY    . ENDOMETRIAL BIOPSY  2009   negative  . INCONTINENCE  SURGERY  2017  . RIGHT HEART CATH N/A 09/20/2017   Procedure: RIGHT HEART CATH;  Surgeon: Larey Dresser, MD;  Location: [place] [facility];  Service: Cardiovascular;  Laterality: N/A;  . RIGHT/LEFT HEART CATH AND CORONARY ANGIOGRAPHY N/A 03/19/2018   Procedure: RIGHT/LEFT HEART CATH AND CORONARY ANGIOGRAPHY;  Surgeon: Larey Dresser, MD;  Location: [place] [facility];  Service: Cardiovascular;  Laterality: N/A;    Social History   Socioeconomic History  . Marital status: Married    Spouse name: Not on file  . Number of children: 3  . Years of education: Not on file  . Highest education level: Not on file  Occupational History  . Not on file  Social Needs  . Financial resource strain: Not on file  . Food insecurity:    Worry: Not on file    Inability: Not on file  . Transportation needs:    Medical: Not on file    Non-medical: Not on file  Tobacco Use  . Smoking status: Former Smoker    Packs/day: 1.00    Years: 30.00    Pack years: 30.00    Types: Cigarettes    Last attempt to quit: 1989  Years since quitting: 31.4  . Smokeless tobacco: Never Used  Substance and Sexual Activity  . Alcohol use: No  . Drug use: No  . Sexual activity: Yes    Partners: Male    Birth control/protection: Post-menopausal, Surgical  Lifestyle  . Physical activity:    Days per week: Not on file    Minutes per session: Not on file  . Stress: Not on file  Relationships  . Social connections:    Talks on phone: Not on file    Gets together: Not on file    Attends religious service: Not on file    Active member of club or organization: Not on file    Attends meetings of clubs or organizations: Not on file    Relationship status: Not on file  . Intimate partner violence:    Fear of current or ex partner: Not on file    Emotionally abused: Not on file    Physically abused: Not on file    Forced sexual activity: Not on file  Other Topics Concern  . Not on file  Social History  Narrative   Married. 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 albuterol (VENTOLIN HFA) 108 (90 Base) MCG/ACT inhaler INHALE 1-2 PUFFS INTO THE LUNGS EVERY 4 HOURS AS NEEDED FOR WHEEZING OR SHORTNESS OF BREATH. 6.7 g 5  . aspirin EC 81 MG EC tablet Take 1 tablet (81 mg total) by mouth daily. 30 tablet 0  . atorvastatin (LIPITOR) 10 MG tablet Take 10 mg by mouth at bedtime.     [name] BIDIL 20-37.5 MG tablet TAKE 1 TABLET BY MOUTH THREE TIMES A DAY 270 tablet 0  . bisoprolol (ZEBETA) 5 MG tablet Take 0.5 tablets (2.5 mg total) by mouth daily. 45 tablet 3  . Blood Glucose Calibration (ACCU-CHEK AVIVA) SOLN USE AS INSTRUCTED TO TEST BLOOD SUGAR DAILY 1 each 2  . Blood Glucose Monitoring Suppl (ACCU-CHEK AVIVA PLUS) w/Device KIT Use as instructed to test blood sugar up to 3 times daily 1 kit 0  . famotidine (PEPCID) 40 MG tablet TAKE 1 TABLET BY MOUTH TWICE A DAY (Patient taking differently: Take 40 mg by mouth daily. ) 180 tablet 0  . Fluticasone-Salmeterol (WIXELA INHUB) 250-50 MCG/DOSE AEPB One puff twice daily    . furosemide (LASIX) 20 MG tablet Take 1 tablet (20 mg total) by mouth daily. 90 tablet 3  . glucose blood (ACCU-CHEK AVIVA PLUS) test strip Use as instructed to test blood sugar up to 3 times daily 300 each 1  . Lancets (ACCU-CHEK SOFT TOUCH) lancets Use as instructed to test blood sugar up to 3 times daily 300 each 1  . losartan (COZAAR) 100 MG tablet TAKE 1 TABLET BY MOUTH ONCE DAILY FOR BLOOD PRESSURE 90 tablet 0  . mirtazapine (REMERON) 15 MG tablet TAKE 1 TABLET BY MOUTH AT BEDTIME. FOR DEPRESSION AND APPETITE. 90 tablet 0  . OXYGEN Inhale 2 L into the lungs continuous.     . potassium chloride SA (K-DUR) 10 MEQ tablet Take 2 tablets (20 mEq total) by mouth daily. For low potassium. 60 tablet 3  .  spironolactone (ALDACTONE) 25 MG tablet Take 0.5 tablets (12.5 mg total) by mouth daily. 30 tablet 0  . [DISCONTINUED] glipiZIDE (GLUCOTROL) 5  MG tablet Take 0.5 tablets (2.5 mg total) by mouth daily before breakfast. 60 tablet 0   No current facility-administered medications on file prior to visit.     No Known Allergies  Family History  Problem Relation Age of Onset  . Ovarian cancer Mother   . Breast cancer Neg Hx   . Hyperparathyroidism Neg Hx   . Colon cancer Neg Hx   . Esophageal cancer Neg Hx   . Stomach cancer Neg Hx      Review of Systems Denies muscle cramps.     Objective:   Physical Exam    Lab Results  Component Value Date   PTH 29 02/13/2018   CALCIUM 10.4 (H) 05/21/2018   CAION 1.44 (H) 03/19/2018   CAION 1.37 03/19/2018       Assessment & Plan:  Primary hyperparathyroidism: uncertain if she is on rx.   Patient Instructions  I have sent a prescription to your pharmacy, to refill the cinacalcet. Please come back for a follow-up appointment in 4-6 weeks, in person.

## 2018-05-29 NOTE — Patient Instructions (Addendum)
I have sent a prescription to your pharmacy, to refill the cinacalcet. Please come back for a follow-up appointment in 4-6 weeks, in person.

## 2018-05-30 ENCOUNTER — Other Ambulatory Visit (HOSPITAL_COMMUNITY): Payer: Medicare HMO

## 2018-06-02 DIAGNOSIS — J9611 Chronic respiratory failure with hypoxia: Secondary | ICD-10-CM | POA: Diagnosis not present

## 2018-06-03 NOTE — Progress Notes (Signed)
Chart and office note reviewed in detail  > agree with a/p as outlined

## 2018-06-04 DIAGNOSIS — I2510 Atherosclerotic heart disease of native coronary artery without angina pectoris: Secondary | ICD-10-CM | POA: Diagnosis not present

## 2018-06-04 DIAGNOSIS — I5033 Acute on chronic diastolic (congestive) heart failure: Secondary | ICD-10-CM | POA: Diagnosis not present

## 2018-06-04 DIAGNOSIS — J188 Other pneumonia, unspecified organism: Secondary | ICD-10-CM | POA: Diagnosis not present

## 2018-06-04 DIAGNOSIS — I272 Pulmonary hypertension, unspecified: Secondary | ICD-10-CM | POA: Diagnosis not present

## 2018-06-04 DIAGNOSIS — I110 Hypertensive heart disease with heart failure: Secondary | ICD-10-CM | POA: Diagnosis not present

## 2018-06-04 DIAGNOSIS — J440 Chronic obstructive pulmonary disease with acute lower respiratory infection: Secondary | ICD-10-CM | POA: Diagnosis not present

## 2018-06-04 DIAGNOSIS — J9621 Acute and chronic respiratory failure with hypoxia: Secondary | ICD-10-CM | POA: Diagnosis not present

## 2018-06-04 DIAGNOSIS — J441 Chronic obstructive pulmonary disease with (acute) exacerbation: Secondary | ICD-10-CM | POA: Diagnosis not present

## 2018-06-04 DIAGNOSIS — E119 Type 2 diabetes mellitus without complications: Secondary | ICD-10-CM | POA: Diagnosis not present

## 2018-06-07 ENCOUNTER — Other Ambulatory Visit: Payer: Self-pay

## 2018-06-07 ENCOUNTER — Telehealth (HOSPITAL_COMMUNITY): Payer: Self-pay

## 2018-06-07 ENCOUNTER — Telehealth (HOSPITAL_COMMUNITY): Payer: Self-pay | Admitting: *Deleted

## 2018-06-07 ENCOUNTER — Ambulatory Visit (HOSPITAL_COMMUNITY)
Admission: RE | Admit: 2018-06-07 | Discharge: 2018-06-07 | Disposition: A | Discharge status: Home / Self Care | Payer: Medicare HMO | Source: Ambulatory Visit | Attending: Cardiology | Admitting: Cardiology

## 2018-06-07 DIAGNOSIS — I272 Pulmonary hypertension, unspecified: Secondary | ICD-10-CM | POA: Diagnosis not present

## 2018-06-07 DIAGNOSIS — I5042 Chronic combined systolic (congestive) and diastolic (congestive) heart failure: Secondary | ICD-10-CM | POA: Diagnosis not present

## 2018-06-07 MED ORDER — FUROSEMIDE 20 MG PO TABS: 20.0000 mg | ORAL_TABLET | Freq: Every day | ORAL | 3 refills | Status: DC

## 2018-06-07 MED ORDER — SPIRONOLACTONE 25 MG PO TABS: 25.0000 mg | ORAL_TABLET | Freq: Every day | ORAL | 0 refills | Status: DC

## 2018-06-07 NOTE — Patient Instructions (Signed)
We will check on your Tyvaso medication application for you Pulmonary Hypertension.   Take Lasix 20 mg daily.  Increase spironolactone to 25 mg daily.   Labs to be drawn next week, 9 June @ 11am.  A home health nurse has been referred to your house for REDS vest reading, this is a painless test that evaluates the amount of fluid in your chest.   Followup in office in 1 month, we will call you to schedule this appointment with you.  You have been referred to the paramedicine program.

## 2018-06-07 NOTE — Telephone Encounter (Signed)
Jackelyn Poling is out to see pt and reports the following:  BP 140/80 HR 86 Resp 24 Sats 87-89% on 2L [place] she will increase to 3 L Wt 103 ReDS 27%  She states pt c/o SOB, lungs do not sound "wet"  Discussed all above w/Dr Aundra Dubin, he advised pt should not start Furosemide now but go ahead and increase Arlyce Harman to 25 mg daily.  If stats stay low or SOB increases can go to ER.  Jackelyn Poling will advise pt

## 2018-06-07 NOTE — Telephone Encounter (Signed)
LVM to review AVS:      1. Need to get Tyvaso started for her (pulmonary hypertension medication, must go through specialty pharmacy). /BR to check on previous app, will have pt fill out new app during lab appt next week if prob with previous 2. Start taking Lasix 20 mg daily and increase spironolactone to 25 mg daily. She will need BMET drawn next week. /scripts sent, lab sched for 6/9 _0  3. Send nurse to her house for REDS vest reading. Kayleen Memos placed to [employer] for [place] 4. Followup in office in 1 month. /routed to [place] 5. Get her in the paramedicine program. /routed to [place] pt to call back regarding med changes and will print out AVS for lab appt.

## 2018-06-08 NOTE — Progress Notes (Signed)
Denise [name]       DOB: 01/27/1945  Purpose of Visit: ReDS reading HF provider: Aundra Dubin  Medications: Is the patient taking all medications listed on MAR from Epic? No but she can't give an accurate answer of which she takes regularly. List any medications that are not being taken correctly:  Not sure.  But she did say she doesn't take Lasix as Rx'd.  List any medication refills needed: non currently  Is the patient able to pick up medications? Yes, husband does.  Vitals: BP:  140/80   HR: 86-89   Oxygen:  86-89% 2L/[place] Weight:  103lbs CBG in am 135        Physical Exam:  Lung sounds: clear, diminished in LL's  Heart sounds:   Peripheral edema: no  Wounds: no  Location:  Any patient concerns?   C/O shob increasing  Over last several weeks, esp today.  Also states she has been voiding 3-4x/day x last few weeks which is more than usual for her.  She states she hasn't had her home PT appointments in the last few weeks either since her PT is on vacation and she can feel she's gotten weaker and more shob.  Denies fever, had dry cough (allergies).  She states she usually has a BM each time she voids (3-4x's/day).    Called HF clinic to report findings to Dr. Aundra Dubin who wanted her stop Lasix for now and increase spironolactone 44m from 1/2 to 1 tab Qam.  Also advised her to increase to 2.5L to 3L O2 if needed as she is shob.   She knows she can call 911 if she gets into trouble and feels she needs to.  She lives w/her husband who is a great help to her and documents her biometrics daily.  ReDS Vest/Clip Reading: 28% and 27% on second check  Rhythm Strip: n/a  Is Home Health recommended? Yes, but she already has HSoldierset up. If yes, state reason:   DVanita Ingles RN 06/08/18

## 2018-06-08 NOTE — Progress Notes (Addendum)
Heart Failure TeleHealth Note  Due to national recommendations of social distancing due to Gu[place]upe Guerra 19, Audio/video telehealth visit is felt to be most appropriate for this patient at this time.  See MyChart message from today for patient consent regarding telehealth for [facility].  Date:  06/08/2018   ID:  Denise [name], [place] July 28, 1945, MRN [id number]  Location: Home  Provider location: [place] [facility] Type of Visit: Established patient   PCP:  [name], NP  Cardiologist: Dr. Aundra Dubin  Chief Complaint: Shortness of breath   History of Present Illness: Denise [name] is a 73 y.o. female who presents via Engineer, civil (consulting) for a telehealth visit today.     she denies symptoms worrisome for COVID 19.   Patient has COPD on home oxygen, pulmonary HTN, suspected hypertrophic cardiomyopathy, HLD, DM2, Colon cancer s/p resection, hyperparathyroidism pending surgery, and HTN.   Admitted 9/15 - 09/24/17 for acute on chronic hypoxic respiratory failure. RHC at that time by Dr. Aundra Dubin showed significant pulmonary HTN. Thought to be most consistent with WHO group 3/COPD. Hospital course complicated by abdominal pain. GI work up with no clear cause.  Seen by Dr. Melvyn Novas 11/01/17. PFTs that day with significant COPD.   She was hospitalized in 3/20 with PNA/COPD exacerbation and CHF exacerbation.  She was treated with antibiotics and diuresed.  RHC/LHC showed no coronary disease, normal filling pressures, moderate pulmonary hypertension.  V/Q scan showed no chronic PE.  High resolution CT showed moderate COPD, no interstitial lung disease.  Echo was concerning for hypertrophic CMP, so cardiac MRI was done.  LV EF 38%, moderate focal basal septal hypertrophy, mid-wall LGE in a noncoronary pattern suggestive of HCM.   She has continued to lose weight at home.  She is using Lasix only prn.  Shortness of breath has been worse recently, she is out of breath  walking around her house. She continues to use home oxygen.  No wheezing.  No chest pain.  No orthopnea/PND.  No lightheaded/syncope or falls. No peripheral edema.    We had a REDS vest measurement done today by home health, 29%.   Labs (3/20): RF elevated but anti-CCP negative, SPEP negative, anti-centromere Ab negative, anti-SCL 70 negative, ANA negative anti-Jo-1 negative, HIV negative.  Labs (4/20): K 3.5, creatinine 1.02, hgb 13, BNP 910, TSH normal Labs (5/20): K 3.1, creatinine 1.0  PMH: 1. Colon cancer: Surgery 1988.  2. COPD: Severe, on home oxygen.  - CT chest (3/20): Moderate emphysema.  3. Hyperlipidemia 4. HTN 5. Primary hyperparathyroidism 6. Pulmonary hypertension: Suspect there is a component of group 3 PH due to lung disease, but think also out of proportion so may be group 1 component.  - RHC (9/19): mean RA 3, PA 57/16 mean 32, mean PCWP 5, CI 1.61, PVR 10 WU.  - High resolution CT chest (3/20) with moderate emphysema, no ILD.  - Echo (3/20) with mild RV dilation/moderately decreased RV systolic function.  - LHC/RHC (3/20): Luminal irregularities in coronaries; mean RA 7, PA 59/22 mean 36, mean PCWP 9, CI 2, PVR 8.6 WU.  - HIV, anti-SCL-70, anti-centromere, CCP, ANA, anti-Jo-1 negative.  - V/Q scan (9/19): No evidence for chronic PE.  7. Suspected hypertrophic cardiomyopathy:  - Echo (3/20) with EF 45%, moderate asymmetric septal hypertrophy, mildly dilated RV with moderately decreased systolic function.  No mitral valve SAM.  - PYP scan (3/20): Not suggestive of transthyretin amyloidosis.  - Cardiac MRI (3/20): LV EF 38%,  moderate focal basal septal hypertrophy, RV EF 30%, LGE pattern suggestive of HCM.    Current Outpatient Medications  Medication Sig Dispense Refill  . albuterol (VENTOLIN HFA) 108 (90 Base) MCG/ACT inhaler INHALE 1-2 PUFFS INTO THE LUNGS EVERY 4 HOURS AS NEEDED FOR WHEEZING OR SHORTNESS OF BREATH. 6.7 g 5  . aspirin EC 81 MG EC tablet Take 1  tablet (81 mg total) by mouth daily. 30 tablet 0  . atorvastatin (LIPITOR) 10 MG tablet Take 10 mg by mouth at bedtime.     [name] BIDIL 20-37.5 MG tablet TAKE 1 TABLET BY MOUTH THREE TIMES A DAY 270 tablet 0  . bisoprolol (ZEBETA) 5 MG tablet Take 0.5 tablets (2.5 mg total) by mouth daily. 45 tablet 3  . Blood Glucose Calibration (ACCU-CHEK AVIVA) SOLN USE AS INSTRUCTED TO TEST BLOOD SUGAR DAILY 1 each 2  . Blood Glucose Monitoring Suppl (ACCU-CHEK AVIVA PLUS) w/Device KIT Use as instructed to test blood sugar up to 3 times daily 1 kit 0  . cinacalcet (SENSIPAR) 30 MG tablet TAKE 1 TABLET (30 MG TOTAL) BY MOUTH 3 (THREE) TIMES A WEEK. 13 tablet 11  . famotidine (PEPCID) 40 MG tablet TAKE 1 TABLET BY MOUTH TWICE A DAY (Patient taking differently: Take 40 mg by mouth daily. ) 180 tablet 0  . Fluticasone-Salmeterol (WIXELA INHUB) 250-50 MCG/DOSE AEPB One puff twice daily    . glucose blood (ACCU-CHEK AVIVA PLUS) test strip Use as instructed to test blood sugar up to 3 times daily 300 each 1  . Lancets (ACCU-CHEK SOFT TOUCH) lancets Use as instructed to test blood sugar up to 3 times daily 300 each 1  . losartan (COZAAR) 100 MG tablet TAKE 1 TABLET BY MOUTH ONCE DAILY FOR BLOOD PRESSURE 90 tablet 0  . mirtazapine (REMERON) 15 MG tablet TAKE 1 TABLET BY MOUTH AT BEDTIME. FOR DEPRESSION AND APPETITE. 90 tablet 0  . OXYGEN Inhale 2 L into the lungs continuous.     . potassium chloride SA (K-DUR) 10 MEQ tablet Take 2 tablets (20 mEq total) by mouth daily. For low potassium. 60 tablet 3  . spironolactone (ALDACTONE) 25 MG tablet Take 1 tablet (25 mg total) by mouth daily. 30 tablet 0   No current facility-administered medications for this encounter.     Allergies:   Patient has no known allergies.   Social History:  The patient  reports that she quit smoking about 31 years ago. Her smoking use included cigarettes. She has a 30.00 pack-year smoking history. She has never used smokeless tobacco. She reports  that she does not drink alcohol or use drugs.   Family History:  The patient's family history includes Ovarian cancer in her mother.   ROS:  Please see the history of present illness.   All other systems are personally reviewed and negative.   Exam:  (Video/Tele Health Call; Exam is subjective and or/visual.) General:  Speaks in full sentences. No resp difficulty. Lungs: Normal respiratory effort with conversation.  Abdomen: Non-distended per patient report Extremities: Pt denies edema. Neuro: Alert & oriented x 3.   Recent Labs: 03/15/2018: ALT 24; B Natriuretic Peptide 1,914.0 03/19/2018: Magnesium 1.8 05/02/2018: Hemoglobin 13.0; Platelets 206.0; Pro B Natriuretic peptide (BNP) 910.0; TSH 0.65 05/21/2018: BUN 17; Creatinine, Ser 1.00; Potassium 3.1; Sodium 144  Personally reviewed   Wt Readings from Last 3 Encounters:  06/08/18 46.7 kg (103 lb)  05/23/18 48.9 kg (107 lb 12.8 oz)  05/16/18 49.9 kg (110 lb)  ASSESSMENT AND PLAN:  1. COPD: Stable on home oxygen.  - Per pulmonary.  2.Chronic systolic CHF: With prominent RV failure. Echo 3/20 with EF 45%, asymmetric septal hypertrophy, moderate RV systolic dysfunction. RHC/LHC was done in 3/20, showing no significant CAD and moderate pulmonary arterial hypertension.  Cardiac MRI showed LV EF 38%, moderate basal septal hypertrophy, RV EF 30%, mid-wall LGE in the basal anteroseptal wall and lesser extent in mid lateral wall.  Most concerning for hypertrophic CMP, less likely cardiac amyloidosis.  PYP scan was negative for evidence of transthyretin amyloidosis and SPEP negative.  She does not have a known family history of HCM. Weight is down but she remains dyspneic, NYHA class III.  I do not think that her dyspnea is due to volume overload, REDS vest measurement was 29%. I suspect dyspnea is due to pulmonary hypertension and COPD primarily at this point.   - She can continue to use Lasix prn. - Continue bisoprolol 2.5 mg daily.   -  Continue losartan 100 mg daily. Possible switch to [facility] in the future.   - Increase spironolactone to 25 mg daily.  I will have her get a BMET and BNP next week.    - Continue Bidil 1 tab tid.  3. Hypertrophic cardiomyopathy: Imaging has been concerning for HCM without LVOT obstruction or SAM.  No family history of HCM or sudden death.   4. Pulmonary hypertension: RHC showed moderate pulmonary arterial hypertension with high PVR (8.6 WU).  V/Q scan (9/19) did not show chronic PE. CT chest high resolution showed no ILD, there was moderate emphysema.  Pulmonary hypertension seems to be out of proportion to degree of lung disease.  Suspect mixed group 1 (idiopathic PAH) and group 3 PH.  Serologies sent and came back negative other than RF elevated (CCP negative). HIV negative. She has been on amlodipine in the past without improvement. NYHA class III.  - It is reasonable to give her a trial of selective pulmonary vasodilators for component of group 1 PH (IPAH).  Would start with Tyvaso to try to avoid V/Q mismatching given moderate emphysema.  I will work on getting this set up.  - 6 minute walk when she is in the office again.    COVID screen The patient does not have any symptoms that suggest any further testing/ screening at this time.  Social distancing reinforced today.  Patient Risk: After full review of this patients clinical status, I feel that they are at moderate risk for cardiac decompensation at this time.  Relevant cardiac medications were reviewed at length with the patient today. The patient does not have concerns regarding their medications at this time.   Recommended follow-up: 1 month in the office.   Today, I have spent 21 minutes with the patient with telehealth technology discussing the above issues .    Signed, Loralie Champagne, MD  06/08/2018  [facility] [address] [facility] and [place] [address] [phone] (office)  [phone] (fax)

## 2018-06-10 ENCOUNTER — Other Ambulatory Visit (HOSPITAL_COMMUNITY): Payer: Self-pay | Admitting: *Deleted

## 2018-06-10 DIAGNOSIS — I272 Pulmonary hypertension, unspecified: Secondary | ICD-10-CM | POA: Diagnosis not present

## 2018-06-10 DIAGNOSIS — I2510 Atherosclerotic heart disease of native coronary artery without angina pectoris: Secondary | ICD-10-CM | POA: Diagnosis not present

## 2018-06-10 DIAGNOSIS — J9621 Acute and chronic respiratory failure with hypoxia: Secondary | ICD-10-CM | POA: Diagnosis not present

## 2018-06-10 DIAGNOSIS — E119 Type 2 diabetes mellitus without complications: Secondary | ICD-10-CM | POA: Diagnosis not present

## 2018-06-10 DIAGNOSIS — I5033 Acute on chronic diastolic (congestive) heart failure: Secondary | ICD-10-CM | POA: Diagnosis not present

## 2018-06-10 DIAGNOSIS — J188 Other pneumonia, unspecified organism: Secondary | ICD-10-CM | POA: Diagnosis not present

## 2018-06-10 DIAGNOSIS — J440 Chronic obstructive pulmonary disease with acute lower respiratory infection: Secondary | ICD-10-CM | POA: Diagnosis not present

## 2018-06-10 DIAGNOSIS — J441 Chronic obstructive pulmonary disease with (acute) exacerbation: Secondary | ICD-10-CM | POA: Diagnosis not present

## 2018-06-10 DIAGNOSIS — I110 Hypertensive heart disease with heart failure: Secondary | ICD-10-CM | POA: Diagnosis not present

## 2018-06-10 MED ORDER — SPIRONOLACTONE 25 MG PO TABS: 25.0000 mg | ORAL_TABLET | Freq: Every day | ORAL | 3 refills | Status: DC

## 2018-06-11 ENCOUNTER — Other Ambulatory Visit (HOSPITAL_COMMUNITY): Payer: Medicare HMO

## 2018-06-11 ENCOUNTER — Telehealth (HOSPITAL_COMMUNITY): Payer: Self-pay

## 2018-06-11 NOTE — Telephone Encounter (Signed)
error 

## 2018-06-11 NOTE — Addendum Note (Signed)
Encounter addended by: Larey Dresser, MD on: 06/11/2018 3:45 PM  Actions taken: Clinical Note Signed

## 2018-06-12 ENCOUNTER — Telehealth (HOSPITAL_COMMUNITY): Payer: Self-pay

## 2018-06-12 ENCOUNTER — Ambulatory Visit: Payer: Medicare HMO | Admitting: Adult Health

## 2018-06-12 NOTE — Telephone Encounter (Signed)
Pre-certification form faxed to [facility] for Tyvaso.  Confirmation of receipt received.

## 2018-06-13 ENCOUNTER — Telehealth (HOSPITAL_COMMUNITY): Payer: Self-pay | Admitting: Surgery

## 2018-06-13 NOTE — Telephone Encounter (Signed)
Patient referred to the [place].  I have sent all appropriate paperwork via secure email to the paramedic team.

## 2018-06-17 ENCOUNTER — Telehealth: Payer: Self-pay

## 2018-06-17 DIAGNOSIS — J188 Other pneumonia, unspecified organism: Secondary | ICD-10-CM | POA: Diagnosis not present

## 2018-06-17 DIAGNOSIS — I5033 Acute on chronic diastolic (congestive) heart failure: Secondary | ICD-10-CM | POA: Diagnosis not present

## 2018-06-17 DIAGNOSIS — J9621 Acute and chronic respiratory failure with hypoxia: Secondary | ICD-10-CM | POA: Diagnosis not present

## 2018-06-17 DIAGNOSIS — I272 Pulmonary hypertension, unspecified: Secondary | ICD-10-CM | POA: Diagnosis not present

## 2018-06-17 DIAGNOSIS — I2510 Atherosclerotic heart disease of native coronary artery without angina pectoris: Secondary | ICD-10-CM | POA: Diagnosis not present

## 2018-06-17 DIAGNOSIS — J441 Chronic obstructive pulmonary disease with (acute) exacerbation: Secondary | ICD-10-CM | POA: Diagnosis not present

## 2018-06-17 DIAGNOSIS — I110 Hypertensive heart disease with heart failure: Secondary | ICD-10-CM | POA: Diagnosis not present

## 2018-06-17 DIAGNOSIS — J440 Chronic obstructive pulmonary disease with acute lower respiratory infection: Secondary | ICD-10-CM | POA: Diagnosis not present

## 2018-06-17 DIAGNOSIS — E119 Type 2 diabetes mellitus without complications: Secondary | ICD-10-CM | POA: Diagnosis not present

## 2018-06-17 NOTE — Telephone Encounter (Signed)
Would recommend she connect with patient's pulmonologist and cardiologist. It appears that she may be overdue for BMP and BNP.  Have her touch base with [place] once she's connected with the patient's cardiologist.

## 2018-06-17 NOTE — Telephone Encounter (Signed)
Received letter from Claris Pong covered under patient Medicare Part B until 01/02/19 however denied by Medicare part D

## 2018-06-17 NOTE — Telephone Encounter (Addendum)
Westley with [facility] left v/m;said on 06/02/18 pt weighed 107 lbs and on 06/16/18 pt weighed 98.4 lbs. In last 2 wks pt has lost approx. 10 lbs and has not altered diet or activity.pt has increased SOB at rest; when sitting Oxygen off pulse ox is 70% and sitting with oxygen on pulse ox is 96-97%. Pt experiencing more SOB with activity; decreased tolerance of any activity. Anda Kraft wants to know if pt should be seen in office or send [facility] nurse to do assessment and possible labs. Anda Kraft request a hold on PT due to wt loss. Anda Kraft request cb.

## 2018-06-17 NOTE — Telephone Encounter (Signed)
Message left for Denise [name] to return my call.

## 2018-06-18 ENCOUNTER — Telehealth (HOSPITAL_COMMUNITY): Payer: Self-pay

## 2018-06-18 NOTE — Telephone Encounter (Signed)
Message left for patient to return my call.  

## 2018-06-18 NOTE — Telephone Encounter (Signed)
Message left for Anda Kraft to return my call.

## 2018-06-18 NOTE — Telephone Encounter (Signed)
I called Denise [name] to schedule a CHP visit. We agreed on 10am tomorrow morning.

## 2018-06-19 ENCOUNTER — Other Ambulatory Visit (HOSPITAL_COMMUNITY): Payer: Self-pay

## 2018-06-19 NOTE — Progress Notes (Signed)
Paramedicine Encounter    Patient ID: Denise [name], female    DOB: 07/24/1945, 73 y.o.   MRN: [id number]    Patient Care Team: Clark, Katherine K, NP as PCP - General (Internal Medicine) Nishan, Peter C, MD as Consulting Physician (Cardiology)  Patient Active Problem List   Diagnosis Date Noted  . CHF (congestive heart failure) ([name]) 05/16/2018  . Chronic respiratory failure with hypoxia and hypercapnia ([name]) 05/03/2018  . Borderline abnormal TFTs 05/02/2018  . Type 2 diabetes mellitus ([name]) 04/26/2018  . Unintended weight loss 04/26/2018  . Diarrhea 04/02/2018  . Acute exacerbation of CHF (congestive heart failure) ([name]) 03/15/2018  . Lower extremity edema 02/20/2018  . Chronic respiratory failure with hypoxia ([name]) 10/02/2017  . Hematuria 09/27/2017  . SBO (small bowel obstruction) ([name])   . Pulmonary hypertension ([name]) c/w cor pulmonale   . Acute respiratory failure with hypoxia ([name]) 09/17/2017  . Epigastric pain 09/17/2017  . Protein-calorie malnutrition, severe 09/17/2017  . Abnormal CT scan, gastrointestinal tract   . Constipation   . Other ascites   . Hyperparathyroidism, primary ([name]) 07/27/2016  . Loss of appetite 06/02/2016  . COPD GOLD  II   11/22/2015  . Vitamin D deficiency 11/22/2015  . Anxiety and depression 11/22/2015  . Insomnia 11/22/2015  . Altered mental status 11/22/2015  . Hyperlipidemia 11/22/2015  . Essential hypertension 11/22/2015  . History of adenomatous polyp of colon 02/10/2013  . History of colon cancer 06/11/1986    Current Outpatient Medications:  .  albuterol (VENTOLIN HFA) 108 (90 Base) MCG/ACT inhaler, INHALE 1-2 PUFFS INTO THE LUNGS EVERY 4 HOURS AS NEEDED FOR WHEEZING OR SHORTNESS OF BREATH., Disp: 6.7 g, Rfl: 5 .  aspirin EC 81 MG EC tablet, Take 1 tablet (81 mg total) by mouth daily., Disp: 30 tablet, Rfl: 0 .  atorvastatin (LIPITOR) 10 MG tablet, Take 10 mg by mouth at bedtime. , Disp: , Rfl:  .  BIDIL 20-37.5 MG tablet,  TAKE 1 TABLET BY MOUTH THREE TIMES A DAY, Disp: 270 tablet, Rfl: 0 .  bisoprolol (ZEBETA) 5 MG tablet, Take 0.5 tablets (2.5 mg total) by mouth daily., Disp: 45 tablet, Rfl: 3 .  cinacalcet (SENSIPAR) 30 MG tablet, TAKE 1 TABLET (30 MG TOTAL) BY MOUTH 3 (THREE) TIMES A WEEK., Disp: 13 tablet, Rfl: 11 .  famotidine (PEPCID) 40 MG tablet, TAKE 1 TABLET BY MOUTH TWICE A DAY (Patient taking differently: Take 40 mg by mouth daily. ), Disp: 180 tablet, Rfl: 0 .  Fluticasone-Salmeterol (WIXELA INHUB) 250-50 MCG/DOSE AEPB, One puff twice daily, Disp: , Rfl:  .  losartan (COZAAR) 100 MG tablet, TAKE 1 TABLET BY MOUTH ONCE DAILY FOR BLOOD PRESSURE, Disp: 90 tablet, Rfl: 0 .  OXYGEN, Inhale 2 L into the lungs continuous. , Disp: , Rfl:  .  potassium chloride SA (K-DUR) 10 MEQ tablet, Take 2 tablets (20 mEq total) by mouth daily. For low potassium., Disp: 60 tablet, Rfl: 3 .  spironolactone (ALDACTONE) 25 MG tablet, Take 1 tablet (25 mg total) by mouth daily., Disp: 90 tablet, Rfl: 3 .  Blood Glucose Calibration (ACCU-CHEK AVIVA) SOLN, USE AS INSTRUCTED TO TEST BLOOD SUGAR DAILY, Disp: 1 each, Rfl: 2 .  Blood Glucose Monitoring Suppl (ACCU-CHEK AVIVA PLUS) w/Device KIT, Use as instructed to test blood sugar up to 3 times daily, Disp: 1 kit, Rfl: 0 .  glucose blood (ACCU-CHEK AVIVA PLUS) test strip, Use as instructed to test blood sugar up to 3 times daily, Disp: 300   each, Rfl: 1 .  Lancets (ACCU-CHEK SOFT TOUCH) lancets, Use as instructed to test blood sugar up to 3 times daily, Disp: 300 each, Rfl: 1 .  mirtazapine (REMERON) 15 MG tablet, TAKE 1 TABLET BY MOUTH AT BEDTIME. FOR DEPRESSION AND APPETITE., Disp: 90 tablet, Rfl: 0 No Known Allergies   Social History   Socioeconomic History  . Marital status: Married    Spouse name: Not on file  . Number of children: 3  . Years of education: Not on file  . Highest education level: Not on file  Occupational History  . Not on file  Social Needs  . Financial  resource strain: Not on file  . Food insecurity    Worry: Not on file    Inability: Not on file  . Transportation needs    Medical: Not on file    Non-medical: Not on file  Tobacco Use  . Smoking status: Former Smoker    Packs/day: 1.00    Years: 30.00    Pack years: 30.00    Types: Cigarettes    Quit date: 1989    Years since quitting: 31.4  . Smokeless tobacco: Never Used  Substance and Sexual Activity  . Alcohol use: No  . Drug use: No  . Sexual activity: Yes    Partners: Male    Birth control/protection: Post-menopausal, Surgical  Lifestyle  . Physical activity    Days per week: Not on file    Minutes per session: Not on file  . Stress: Not on file  Relationships  . Social connections    Talks on phone: Not on file    Gets together: Not on file    Attends religious service: Not on file    Active member of club or organization: Not on file    Attends meetings of clubs or organizations: Not on file    Relationship status: Not on file  . Intimate partner violence    Fear of current or ex partner: Not on file    Emotionally abused: Not on file    Physically abused: Not on file    Forced sexual activity: Not on file  Other Topics Concern  . Not on file  Social History Narrative   Married. 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     Breath sounds: No wheezing or rales.  Musculoskeletal:        General: No swelling.     Right lower leg: No edema.     Left lower leg: No edema.  Skin:    General: Skin is warm and dry.         Future Appointments  Date Time Provider Department Center  06/24/2018 10:20 AM Clark, Katherine K, NP [facility]  06/26/2018 10:00 AM Wert, Michael B, MD [facility] None  07/02/2018  4:00 PM Ellison, Sean, MD [facility] None  07/09/2018 12:10 PM [facility] MM 2 [facility]      BP (!) 80/60 (BP Location: Left Arm, Patient Position: Sitting, Cuff Size: Normal)   Pulse 99   Wt 101 lb 9.6 oz (46.1 kg)   SpO2 96%   BMI 16.40 kg/m    *Initial visit* ATF pt CAO x4 sitting in the living room with her husband present during our visit. She stated that she   has been feeling a little drained lately, like she "can take a nap right now".  She has taken all of her medications on her own this morning. She has several slots in her pill box that is full of a particular med.  She denies sob, chest pain and dizziness. She does wear her nasal canula throughout the day as prescribed.  Her husband has been taking record of her BP and weights.  [name] BP hypotensive with no ortho static changes.  Furosemide.  [place] spoke with Amy about the same.  Amy advise pt not to take anymore right now.   She's very worried about her weight loss. Denise [name] stated that she drinks boost throughout the day and eats 3 times a day.  We discussed her taking ensure plus and the benefits of eating oatmeal, grits and whole wheat with her sandwiches.  She has a Therapist, sports that was coming from [place] but she's unclear if she will continue coming. rx bottles verified and pill box filled.  I showed her and her husband how to use the pill box, they both agreed that they understand how to use it.  She had a couple of meds that were expired that I took away from the house.    Pt and her husband agress to [facility] visits/ HIPPA form signed.    humana rejected tyvaso, so pt's husband is going to call the phycian for another prescription or other payment options.  Medication ordered: klor con cinacalcet ($96) which is difficult for the pt to afford)  Dylon Correa, EMT Paramedic [phone] 06/21/2018    ACTION: Home visit completed

## 2018-06-21 NOTE — Telephone Encounter (Signed)
Spoken and notified patient of Kate Clark's comments. Patient verbalized understanding.  

## 2018-06-21 NOTE — Telephone Encounter (Signed)
I did speak to [employer]. Inform her of Kate's comments. I have asked her to let patient know of the informations, I did leave a message and will try again. Also saw that patient has an appointment on 06/24/2018

## 2018-06-24 ENCOUNTER — Ambulatory Visit (INDEPENDENT_AMBULATORY_CARE_PROVIDER_SITE_OTHER): Payer: Medicare HMO | Admitting: Primary Care

## 2018-06-24 ENCOUNTER — Encounter: Payer: Self-pay | Admitting: Primary Care

## 2018-06-24 ENCOUNTER — Other Ambulatory Visit: Payer: Self-pay

## 2018-06-24 VITALS — BP 124/78 | HR 70 | Temp 98.0°F | Ht 66.0 in | Wt 103.8 lb

## 2018-06-24 DIAGNOSIS — R600 Localized edema: Secondary | ICD-10-CM

## 2018-06-24 DIAGNOSIS — F419 Anxiety disorder, unspecified: Secondary | ICD-10-CM

## 2018-06-24 DIAGNOSIS — I270 Primary pulmonary hypertension: Secondary | ICD-10-CM | POA: Diagnosis not present

## 2018-06-24 DIAGNOSIS — I10 Essential (primary) hypertension: Secondary | ICD-10-CM

## 2018-06-24 DIAGNOSIS — J9611 Chronic respiratory failure with hypoxia: Secondary | ICD-10-CM

## 2018-06-24 DIAGNOSIS — I509 Heart failure, unspecified: Secondary | ICD-10-CM | POA: Diagnosis not present

## 2018-06-24 DIAGNOSIS — I5042 Chronic combined systolic (congestive) and diastolic (congestive) heart failure: Secondary | ICD-10-CM | POA: Diagnosis not present

## 2018-06-24 DIAGNOSIS — Z85038 Personal history of other malignant neoplasm of large intestine: Secondary | ICD-10-CM

## 2018-06-24 DIAGNOSIS — E119 Type 2 diabetes mellitus without complications: Secondary | ICD-10-CM

## 2018-06-24 DIAGNOSIS — F329 Major depressive disorder, single episode, unspecified: Secondary | ICD-10-CM | POA: Diagnosis not present

## 2018-06-24 DIAGNOSIS — E210 Primary hyperparathyroidism: Secondary | ICD-10-CM

## 2018-06-24 DIAGNOSIS — I110 Hypertensive heart disease with heart failure: Secondary | ICD-10-CM | POA: Diagnosis not present

## 2018-06-24 LAB — BASIC METABOLIC PANEL
BUN: 27 mg/dL — ABNORMAL HIGH (ref 6–23)
CO2: 26 mEq/L (ref 19–32)
Calcium: 10.4 mg/dL (ref 8.4–10.5)
Chloride: 108 mEq/L (ref 96–112)
Creatinine, Ser: 1.22 mg/dL — ABNORMAL HIGH (ref 0.40–1.20)
GFR: 52.28 mL/min — ABNORMAL LOW (ref >60.00)
Glucose, Bld: 126 mg/dL — ABNORMAL HIGH (ref 70–99)
Potassium: 4.9 mEq/L (ref 3.5–5.1)
Sodium: 142 mEq/L (ref 135–145)

## 2018-06-24 LAB — BRAIN NATRIURETIC PEPTIDE: Pro B Natriuretic peptide (BNP): 840 pg/mL — ABNORMAL HIGH (ref 0.0–100.0)

## 2018-06-24 LAB — HEMOGLOBIN A1C: Hgb A1c MFr Bld: 6.7 % — ABNORMAL HIGH (ref 4.6–6.5)

## 2018-06-24 NOTE — Assessment & Plan Note (Signed)
Stable in the office today, continue current regimen.

## 2018-06-24 NOTE — Assessment & Plan Note (Signed)
Significantly improved. Using furosemide PRN. Continue same.

## 2018-06-24 NOTE — Assessment & Plan Note (Addendum)
Seems overall stable on supplemental oxygen. Vitals stable. Continue inhaler.

## 2018-06-24 NOTE — Assessment & Plan Note (Signed)
BNP elevated but decrease from prior. Will send results to cardiologist. Discussed to resume Bidil TID as prescribed. Continue beta blocker.

## 2018-06-24 NOTE — Assessment & Plan Note (Signed)
Overall stable on Remeron. Continue same.

## 2018-06-24 NOTE — Patient Instructions (Signed)
Stop by the lab prior to leaving today. I will notify you of your results once received.   Call [facility] office at [phone] to schedule your colonoscopy.  Follow up with the lung doctor and thyroid doctor as scheduled.  Only take the furosemide pill if your legs get large and swollen.  It was a pleasure to see you today!

## 2018-06-24 NOTE — Assessment & Plan Note (Signed)
A1C improved to 6.7 from 9.5 three months ago. Given frailty we will continue to monitor her off of meds. Managed on statin and ARB. Foot exam today.  Repeat A1C in three months.

## 2018-06-24 NOTE — Assessment & Plan Note (Signed)
Phone number provided for [facility] so she can schedule colonoscopy. Referral was placed last visit and GI has been attempting to contact. Stressed the importance of her contacting their office.

## 2018-06-24 NOTE — Assessment & Plan Note (Signed)
Following with endocrinology, continue current regimen.

## 2018-06-24 NOTE — Progress Notes (Signed)
Subjective:    Patient ID: Denise [name], female    DOB: [birth year], 73 y.o.   MRN: [id number]  HPI  Denise [name] is a 73 year old female who presents today for follow up.  1) Essential Hypertension/Pulmonary Hypertension/CHF: Currently following with cardiology. Managed on bisoprolol 2.5 mg daily, losartan 100 mg daily, spironolactone 25 mg daily, Bidil 20-37.5 mg TID,potassium chloride 20 mEq. She takes her furosemide PRN now given weight loss and dehydration from last labs. She hasn't taken Bidil recently.   She is weighing herself daily and denies weight gain of 2 lbs in 24 hours and 5 lbs in one week. Her weight has stabilized from the inial loss several weeks ago. She denies chest pain. Her shortness of breath is stable.   BP Readings from Last 3 Encounters:  06/24/18 124/78  06/19/18 (!) 80/60  06/08/18 140/80   Wt Readings from Last 3 Encounters:  06/24/18 103 lb 12 oz (47.1 kg)  06/19/18 101 lb 9.6 oz (46.1 kg)  06/08/18 103 lb (46.7 kg)     2) COPD/Chronic Respiratory Failure: Currently following with pulmonology and is managed on Wixela, supplemental oxygen. She completed home physical therapy recently. She continues to experience shortness of breath and doesn't believe it's any worse than before. She is not taking furosemide daily.   3) Anxiety/Depression/Insomnia: Currently managed on mirtazapine 15 mg HS. Doing well overall. Denies SI/HI.  4) Hyperparathyroidism: Following with endocrinology and managed on Sensipar 30 mg. She is due for a follow up visit later this month.   5) Type 2 Diabetes:  Current medications include: None  She is checking her blood glucose 1 times daily and is getting readings of:  Low 100's.  Last A1C: 9.3 in March 2020 Last Eye Exam: Completed in 2019 Foot Exam: Pneumonia Vaccination: Unsure, doesn't think she's had one.  ACE/ARB: Losartan  Statin: Atorvastatin   Review of Systems  Constitutional: Negative for unexpected  weight change.  Respiratory: Negative for cough.        Chronic dyspnea  Cardiovascular: Negative for chest pain.  Neurological: Negative for dizziness and headaches.       Past Medical History:  Diagnosis Date  . Altered mental status   . Anxiety and depression   . Chronic respiratory failure with hypoxia ([place]) 10/02/2017   Assoc with ? Cor pulmonale dx 09/2017 so rx 02 1-2 lpm 24/7  - 10/17/2017  Walked RA x 3 laps @ 185 ft each stopped due to  End of study, nl pace,  desat to 87 on 3rd lap - 12/19/2017  Saturations on Room Air at Rest =91 % and while Ambulating = 87%  But  on  2 Liters of pulsed oxygen while Ambulating =93% so rec POC 2lpm walking / use at rest if sats under 90%      . Colon cancer ([facility]) 1988   Resected  . COPD (chronic obstructive pulmonary disease) ([place])   . Diabetes mellitus without complication ([name])    diet controlled- no meds. per pt  . Diverticulosis   . Hyperlipidemia   . Hypertension   . Insomnia   . Primary hyperparathyroidism ([place])   . Tubular adenoma of rectum 02/23/2013   low grade  . Vitamin D deficiency      Social History   Socioeconomic History  . Marital status: Married    Spouse name: Not on file  . Number of children: 3  . Years of education: Not on file  . Highest education  level: Not on file  Occupational History  . Not on file  Social Needs  . Financial resource strain: Not on file  . Food insecurity    Worry: Not on file    Inability: Not on file  . Transportation needs    Medical: Not on file    Non-medical: Not on file  Tobacco Use  . Smoking status: Former Smoker    Packs/day: 1.00    Years: 30.00    Pack years: 30.00    Types: Cigarettes    Quit date: 1989    Years since quitting: 31.4  . Smokeless tobacco: Never Used  Substance and Sexual Activity  . Alcohol use: No  . Drug use: No  . Sexual activity: Yes    Partners: Male    Birth control/protection: Post-menopausal, Surgical  Lifestyle  . Physical activity     Days per week: Not on file    Minutes per session: Not on file  . Stress: Not on file  Relationships  . Social Herbalist on phone: Not on file    Gets together: Not on file    Attends religious service: Not on file    Active member of club or organization: Not on file    Attends meetings of clubs or organizations: Not on file    Relationship status: Not on file  . Intimate partner violence    Fear of current or ex partner: Not on file    Emotionally abused: Not on file    Physically abused: Not on file    Forced sexual activity: Not on file  Other Topics Concern  . Not on file  Social History Narrative   Married. 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 ABDOMINAL HYSTERECTOMY  08/2015  . BREAST BIOPSY Left   . BREAST EXCISIONAL BIOPSY Right   . COLON RESECTION  1980's  . COLONOSCOPY    . ENDOMETRIAL BIOPSY  2009   negative  . INCONTINENCE SURGERY  2017  . RIGHT HEART CATH N/A 09/20/2017   Procedure: RIGHT HEART CATH;  Surgeon: Larey Dresser, MD;  Location: [facility];  Service: Cardiovascular;  Laterality: N/A;  . RIGHT/LEFT HEART CATH AND CORONARY ANGIOGRAPHY N/A 03/19/2018   Procedure: RIGHT/LEFT HEART CATH AND CORONARY ANGIOGRAPHY;  Surgeon: Larey Dresser, MD;  Location: [facility];  Service: Cardiovascular;  Laterality: N/A;    Family History  Problem Relation Age of Onset  . Ovarian cancer Mother   . Breast cancer Neg Hx   . Hyperparathyroidism Neg Hx   . Colon cancer Neg Hx   . Esophageal cancer Neg Hx   . Stomach cancer Neg Hx     No Known Allergies  Current Outpatient Medications on File Prior to Visit  Medication Sig Dispense Refill  . albuterol (VENTOLIN HFA) 108 (90 Base) MCG/ACT inhaler INHALE 1-2 PUFFS INTO THE LUNGS EVERY 4 HOURS AS NEEDED FOR WHEEZING OR SHORTNESS OF BREATH.  6.7 g 5  . aspirin EC 81 MG EC tablet Take 1 tablet (81 mg total) by mouth daily. 30 tablet 0  . atorvastatin (LIPITOR) 10 MG tablet Take 10 mg by mouth at bedtime.     [name] BIDIL 20-37.5 MG tablet TAKE 1 TABLET BY MOUTH THREE TIMES A DAY 270  tablet 0  . bisoprolol (ZEBETA) 5 MG tablet Take 0.5 tablets (2.5 mg total) by mouth daily. 45 tablet 3  . Blood Glucose Calibration (ACCU-CHEK AVIVA) SOLN USE AS INSTRUCTED TO TEST BLOOD SUGAR DAILY 1 each 2  . Blood Glucose Monitoring Suppl (ACCU-CHEK AVIVA PLUS) w/Device KIT Use as instructed to test blood sugar up to 3 times daily 1 kit 0  . cinacalcet (SENSIPAR) 30 MG tablet TAKE 1 TABLET (30 MG TOTAL) BY MOUTH 3 (THREE) TIMES A WEEK. 13 tablet 11  . famotidine (PEPCID) 40 MG tablet TAKE 1 TABLET BY MOUTH TWICE A DAY (Patient taking differently: Take 40 mg by mouth daily. ) 180 tablet 0  . Fluticasone-Salmeterol (WIXELA INHUB) 250-50 MCG/DOSE AEPB One puff twice daily    . glucose blood (ACCU-CHEK AVIVA PLUS) test strip Use as instructed to test blood sugar up to 3 times daily 300 each 1  . Lancets (ACCU-CHEK SOFT TOUCH) lancets Use as instructed to test blood sugar up to 3 times daily 300 each 1  . losartan (COZAAR) 100 MG tablet TAKE 1 TABLET BY MOUTH ONCE DAILY FOR BLOOD PRESSURE 90 tablet 0  . mirtazapine (REMERON) 15 MG tablet TAKE 1 TABLET BY MOUTH AT BEDTIME. FOR DEPRESSION AND APPETITE. 90 tablet 0  . OXYGEN Inhale 2 L into the lungs continuous.     . potassium chloride SA (K-DUR) 10 MEQ tablet Take 2 tablets (20 mEq total) by mouth daily. For low potassium. 60 tablet 3  . spironolactone (ALDACTONE) 25 MG tablet Take 1 tablet (25 mg total) by mouth daily. 90 tablet 3  . [DISCONTINUED] glipiZIDE (GLUCOTROL) 5 MG tablet Take 0.5 tablets (2.5 mg total) by mouth daily before breakfast. 60 tablet 0   No current facility-administered medications on file prior to visit.     BP 124/78   Pulse 70   Temp 98 F (36.7 C) (Oral)   Ht _0  (1.676 m)   Wt  103 lb 12 oz (47.1 kg)   SpO2 98%   BMI 16.75 kg/m    Objective:   Physical Exam  Neck: Neck supple.  Cardiovascular: Normal rate and regular rhythm.  Respiratory: Effort normal and breath sounds normal. No respiratory distress. She has no wheezes.  Skin: Skin is warm and dry.  Psychiatric: She has a normal mood and affect.           Assessment & Plan:

## 2018-06-25 NOTE — Telephone Encounter (Signed)
Spoke with pt, she was only taking 1 tablet a day, explained she was to take Bidil TID. Verbalized understanding and appreciative.

## 2018-06-25 NOTE — Telephone Encounter (Signed)
-----  Message from Larey Dresser, MD sent at 06/25/2018  7:12 AM EDT ----- Make sure she is taking the bidil.

## 2018-06-26 ENCOUNTER — Encounter: Payer: Self-pay | Admitting: Internal Medicine

## 2018-06-26 ENCOUNTER — Ambulatory Visit (INDEPENDENT_AMBULATORY_CARE_PROVIDER_SITE_OTHER): Payer: Medicare HMO | Admitting: Internal Medicine

## 2018-06-26 ENCOUNTER — Other Ambulatory Visit (HOSPITAL_COMMUNITY): Payer: Self-pay

## 2018-06-26 ENCOUNTER — Other Ambulatory Visit: Payer: Self-pay

## 2018-06-26 DIAGNOSIS — J9612 Chronic respiratory failure with hypercapnia: Secondary | ICD-10-CM

## 2018-06-26 DIAGNOSIS — J9611 Chronic respiratory failure with hypoxia: Secondary | ICD-10-CM

## 2018-06-26 DIAGNOSIS — I272 Pulmonary hypertension, unspecified: Secondary | ICD-10-CM

## 2018-06-26 DIAGNOSIS — J449 Chronic obstructive pulmonary disease, unspecified: Secondary | ICD-10-CM | POA: Diagnosis not present

## 2018-06-26 NOTE — Patient Instructions (Signed)
Only use your albuterol (proair)as a rescue medication to be used if you can't catch your breath by resting or doing a relaxed purse lip breathing pattern.  - The less you use it, the better it will work when you need it. - Ok to use up to 2 puffs  every 4 hours if you must but call for immediate appointment if use goes up over your usual need - Don't leave home without it !!  (think of it like the spare tire for your car)   Work on inhaler technique:  relax and gently blow all the way out then take a nice smooth deep breath back in, triggering the inhaler at same time you start breathing in.  Hold for up to 5 seconds if you can.   Rinse and gargle with water when done    If you are satisfied with your treatment plan,  let your doctor know and he/she can either refill your medications or you can return here when your prescription runs out.     If in any way you are not 100% satisfied,  please tell us.  If 100% better, tell your friends!  Pulmonary follow up is as needed

## 2018-06-26 NOTE — Progress Notes (Signed)
Paramedicine Encounter    Patient ID: Denise [name], female    DOB: 1945-01-26, 73 y.o.   MRN: [id number]    Patient Care Team: [name], NP as PCP - General (Internal Medicine) [name], MD as Consulting Physician (Cardiology)  Patient Active Problem List   Diagnosis Date Noted  . CHF (congestive heart failure) ([place]) 05/16/2018  . Chronic respiratory failure with hypoxia and hypercapnia ([place]) 05/03/2018  . Borderline abnormal TFTs 05/02/2018  . Type 2 diabetes mellitus ([place]) 04/26/2018  . Unintended weight loss 04/26/2018  . Diarrhea 04/02/2018  . Acute exacerbation of CHF (congestive heart failure) ([place]) 03/15/2018  . Lower extremity edema 02/20/2018  . Chronic respiratory failure with hypoxia ([place]) 10/02/2017  . Hematuria 09/27/2017  . SBO (small bowel obstruction) ([place])   . Pulmonary hypertension ([name]) c/w cor pulmonale   . Acute respiratory failure with hypoxia ([place]) 09/17/2017  . Epigastric pain 09/17/2017  . Protein-calorie malnutrition, severe 09/17/2017  . Abnormal CT scan, gastrointestinal tract   . Constipation   . Other ascites   . Hyperparathyroidism, primary ([place]) 07/27/2016  . Loss of appetite 06/02/2016  . COPD GOLD  II   11/22/2015  . Vitamin D deficiency 11/22/2015  . Anxiety and depression 11/22/2015  . Insomnia 11/22/2015  . Altered mental status 11/22/2015  . Hyperlipidemia 11/22/2015  . Essential hypertension 11/22/2015  . History of adenomatous polyp of colon 02/10/2013  . History of colon cancer 06/11/1986    Current Outpatient Medications:  .  aspirin EC 81 MG EC tablet, Take 1 tablet (81 mg total) by mouth daily., Disp: 30 tablet, Rfl: 0 .  atorvastatin (LIPITOR) 10 MG tablet, Take 10 mg by mouth at bedtime. , Disp: , Rfl:  .  BIDIL 20-37.5 MG tablet, TAKE 1 TABLET BY MOUTH THREE TIMES A DAY, Disp: 270 tablet, Rfl: 0 .  bisoprolol (ZEBETA) 5 MG tablet, Take 0.5 tablets (2.5 mg total) by mouth daily., Disp: 45 tablet,  Rfl: 3 .  cinacalcet (SENSIPAR) 30 MG tablet, TAKE 1 TABLET (30 MG TOTAL) BY MOUTH 3 (THREE) TIMES A WEEK., Disp: 13 tablet, Rfl: 11 .  losartan (COZAAR) 100 MG tablet, TAKE 1 TABLET BY MOUTH ONCE DAILY FOR BLOOD PRESSURE, Disp: 90 tablet, Rfl: 0 .  mirtazapine (REMERON) 15 MG tablet, TAKE 1 TABLET BY MOUTH AT BEDTIME. FOR DEPRESSION AND APPETITE., Disp: 90 tablet, Rfl: 0 .  OXYGEN, Inhale 2 L into the lungs continuous. , Disp: , Rfl:  .  potassium chloride SA (K-DUR) 10 MEQ tablet, Take 2 tablets (20 mEq total) by mouth daily. For low potassium., Disp: 60 tablet, Rfl: 3 .  spironolactone (ALDACTONE) 25 MG tablet, Take 1 tablet (25 mg total) by mouth daily., Disp: 90 tablet, Rfl: 3 .  albuterol (VENTOLIN HFA) 108 (90 Base) MCG/ACT inhaler, INHALE 1-2 PUFFS INTO THE LUNGS EVERY 4 HOURS AS NEEDED FOR WHEEZING OR SHORTNESS OF BREATH., Disp: 6.7 g, Rfl: 5 .  Blood Glucose Calibration (ACCU-CHEK AVIVA) SOLN, USE AS INSTRUCTED TO TEST BLOOD SUGAR DAILY, Disp: 1 each, Rfl: 2 .  Blood Glucose Monitoring Suppl (ACCU-CHEK AVIVA PLUS) w/Device KIT, Use as instructed to test blood sugar up to 3 times daily, Disp: 1 kit, Rfl: 0 .  famotidine (PEPCID) 40 MG tablet, TAKE 1 TABLET BY MOUTH TWICE A DAY (Patient taking differently: Take 40 mg by mouth daily. ), Disp: 180 tablet, Rfl: 0 .  Fluticasone-Salmeterol (WIXELA INHUB) 250-50 MCG/DOSE AEPB, One puff twice daily, Disp: , Rfl:  .  glucose blood (ACCU-CHEK AVIVA PLUS) test strip, Use as instructed to test blood sugar up to 3 times daily, Disp: 300 each, Rfl: 1 .  Lancets (ACCU-CHEK SOFT TOUCH) lancets, Use as instructed to test blood sugar up to 3 times daily, Disp: 300 each, Rfl: 1 No Known Allergies   Social History   Socioeconomic History  . Marital status: Married    Spouse name: Not on file  . Number of children: 3  . Years of education: Not on file  . Highest education level: Not on file  Occupational History  . Not on file  Social Needs  .  Financial resource strain: Not on file  . Food insecurity    Worry: Not on file    Inability: Not on file  . Transportation needs    Medical: Not on file    Non-medical: Not on file  Tobacco Use  . Smoking status: Former Smoker    Packs/day: 1.00    Years: 30.00    Pack years: 30.00    Types: Cigarettes    Quit date: 1989    Years since quitting: 31.4  . Smokeless tobacco: Never Used  Substance and Sexual Activity  . Alcohol use: No  . Drug use: No  . Sexual activity: Yes    Partners: Male    Birth control/protection: Post-menopausal, Surgical  Lifestyle  . Physical activity    Days per week: Not on file    Minutes per session: Not on file  . Stress: Not on file  Relationships  . Social Herbalist on phone: Not on file    Gets together: Not on file    Attends religious service: Not on file    Active member of club or organization: Not on file    Attends meetings of clubs or organizations: Not on file    Relationship status: Not on file  . Intimate partner violence    Fear of current or ex partner: Not on file    Emotionally abused: Not on file    Physically abused: Not on file    Forced sexual activity: Not on file  Other Topics Concern  . Not on file  Social History Narrative   Married. 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     Breath sounds: No wheezing or rales.  Musculoskeletal:     Right lower leg: No edema.     Left lower leg: No edema.  Skin:    General: Skin is warm and dry.         Future Appointments  Date Time Provider [place]  07/02/2018  4:00 PM Renato Shin, MD [facility] None  07/09/2018 12:10 PM [facility] MM 2 [facility]  07/12/2018 10:00 AM Gatha Mayer, MD [facility]     BP 132/80 (BP Location: Left Arm, Patient Position: Sitting, Cuff  Size: Normal)   Pulse 62   Wt 103 lb (46.7 kg)   SpO2 97%   BMI 17.14 kg/m   Weight yesterday-103 Last visit weight-101  ATF pt CAO x4 sitting at the kitchen table. She said that she still feels tired throughout the day.  She took her meds for this week and tried refilling the box on her own.  I discussed the frequency of  her medications to help her understand the pill box.  She denies sob, chest pain and dizziness.  She stated that she received "breathing medication" from the heart doctor and the RN came to her house to show her house to use it. No other medication changes this week. rx bottles verified and pill box refilled.   Last week her BP was low at 80/60 after taking lasix.  Lasix was taken out of her pill box, today her BP is much better. Her husband said that her appetite has gotten better also.    Tyvaso 1.74 mg/ plus machine  Medication ordered: Famotidine (husband will call in)  [place], EMT Paramedic [phone] 06/26/2018    ACTION: Home visit completed

## 2018-06-26 NOTE — Progress Notes (Signed)
Subjective:     Patient ID: Denise [name], female   DOB: March 21, 1945,    MRN: [id number]    Brief patient profile:  39 yobf  Quit smoking 1989 with doe then dx'd with copd in [place] in mid 1990s where lived  There x 25 years and started on spiriva around 2005-10 and helped some and since 2017 back in [place] and breathing generally better in [place] but not doing as many steps and changed from spiriva to symb but not taking it regulary and no need for rescue inhaler referred to pulmonary clinic 09/11/2016 by Dr   Malena Edman initally with GOLD II criteria    History of Present Illness  09/11/2016 1st [facility] office visit/ Fionna Merriott  Re GOLD II copd  Chief Complaint  Patient presents with  . pulmonary consult    Referred by Dr. Carlis Abbott for COPD. 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 Follow-up    She states she is needing letter releasing her to return to work.  She states her breathing has improved.   Dyspnea:  mb and back flat better since symbicort 160 though hfa quite poor  Cough: none Sleeping: bed flat/ 2 pillows SABA use: rarely since symbicort 02: have it but not using x sometimes hs   rec Plan A = Automatic = Stiolto 2 pffs each am only and no more Symbicort  Work on inhaler technique:  relax and gently blow all the way out then take a nice smooth deep breath back in, triggering the inhaler at same time you start breathing in.  Hold for up to 5 seconds if you can.  Rinse and gargle with water when done Plan B =  Backup Only use your albuterol as a rescue medication to be used if you can't catch your breath by resting or doing a relaxed purse lip breathing pattern.  - The less you use it, the better it will work when you need it. - Ok to use the inhaler up to 2 puffs  every 4 hours if you must but call for appointment if use goes up over your usual need - Don't leave home without it !!  (think of it like the spare tire for your car)  Ok to return to light duty only  Keep previous appt      10/17/2017  f/u ov/Lekendrick Alpern re:  GOLD II copd / cor pulmonale - not using 02 as rec  Chief Complaint  Patient presents with  . Follow-up    Breathing is about the same. She has good days and bad days. She is using her albuterol inhaler 2 x daily on average.   Dyspnea:  mb and back with 02 is easier than 02 but still not using it Cough: minimal Sleeping: bed flat/ 2pillows SABA use: sev times a day  02:  Using 2lpm at bedtime and sometimes with activty   rec Continue stiolto  2 pffs each am  02 2lpm at bedtime and 2lpm with any activity        NP 01/09/2018  COPD medication: - Take Stiolto every day- 2 puffs in the morning  - Use Albuterol/Ventolin rescue inhaler only as needed - take 2 puffs every 4-6 hours for shortness of breath or wheezing  Oxygen:  - Wear 1-2L oxygen to keep O2 >90% during the day and on exertion  - Wear 2L oxygen at night  Follow-up: - 2-3 months with Dr. Melvyn Novas and as needed    03/12/2018  f/u ov/Kennth Vanbenschoten re:   GOLD II / cor pulmonale, thoroughly confused with instructions Chief Complaint  Patient presents with  . Follow-up    Breathing worse since the last visit. She is using her albuterol inhaler 2 x per day.   Dyspnea:  Room to room worse since stopped prednisone/ thinks it may have been helping but clear she did not taper it as rec at last ov Cough: no Sleeping: bed flat/ 2-3pillows otherwise can't breathe SABA use: 2 x daily on generic advair  02: 24/7 at 2lpm  rec Pulse  oximetry is available at [facility] and you should adjust the flow to keep it above 90% Depomedrol 120 mg IM today Start back on stiolto 2 pffs each am Only use your albuterol (either ventolin or proair but not both) as a rescue medication Work on inhaler technique:  Add furosemide back at 20 mg daily and this should bring the swelling down      06/26/2018  f/u ov/Rahul Malinak re: copd/ cor pulmonale/ chronic resp failure with cor pulmonale now on Tyvaso per Dr Aundra Dubin maint on wixela 250 as can't afford stiolto and wixela is generic advair  Chief Complaint  Patient presents with  . Follow-up    COPD. Patient reports that without her oxygen its hard for her to breath. She reports using her rescue inhaler at least 2 times daily.   Dyspnea:  Walking in house with therapist is about all she can do > w/c when goes out  Cough: none Sleeping: bed is flat, turned on side  SABA use: twice daily  02: 2lpm 24/7    No obvious day to day or daytime variability or assoc excess/ purulent sputum or mucus plugs or hemoptysis or cp or chest tightness, subjective wheeze or overt sinus or hb symptoms.   Sleeping  As bove  without nocturnal  or early am exacerbation  of respiratory  c/o's or need for noct saba. Also denies any obvious fluctuation of symptoms with weather or environmental changes or other aggravating or alleviating factors except as outlined above   No unusual exposure hx or h/o childhood pna/ asthma or knowledge of premature birth.  Current Allergies, Complete Past Medical History, Past Surgical History, Family History, and Social History were reviewed in [employer] record.  ROS  The following are not active complaints unless bolded Hoarseness, sore throat, dysphagia, dental problems, itching, sneezing,  nasal congestion or discharge of excess mucus or purulent secretions, ear ache,   fever, chills, sweats, unintended wt loss or wt gain, classically pleuritic or exertional  cp,  orthopnea pnd or arm/hand swelling  or leg swelling, presyncope, palpitations, abdominal pain, anorexia, nausea, vomiting, diarrhea  or change in bowel habits or change in bladder habits, change in stools or change in urine, dysuria, hematuria,  rash, arthralgias, visual complaints, headache, numbness, weakness or ataxia or problems with walking or coordination,  change in mood or  memory.        Current Meds  Medication Sig  . albuterol (VENTOLIN HFA) 108 (90 Base) MCG/ACT inhaler INHALE 1-2 PUFFS INTO THE LUNGS EVERY 4 HOURS AS NEEDED FOR WHEEZING OR SHORTNESS OF BREATH.  [name] aspirin EC 81 MG EC tablet Take 1 tablet (81 mg total) by mouth daily.  [name] atorvastatin (LIPITOR) 10 MG tablet Take 10 mg by mouth at bedtime.   [name] BIDIL 20-37.5 MG tablet TAKE 1 TABLET BY MOUTH THREE TIMES A DAY  . bisoprolol (ZEBETA) 5 MG tablet Take 0.5 tablets (2.5 mg total) by mouth daily.  . Blood Glucose Calibration (ACCU-CHEK AVIVA) SOLN USE AS INSTRUCTED TO TEST BLOOD SUGAR DAILY  . Blood Glucose Monitoring Suppl (ACCU-CHEK AVIVA PLUS) w/Device KIT Use as instructed to test blood sugar up to 3 times daily  . cinacalcet (SENSIPAR) 30 MG tablet TAKE 1 TABLET (30 MG TOTAL) BY MOUTH 3 (THREE) TIMES A WEEK.  . famotidine (PEPCID) 40 MG tablet TAKE 1 TABLET BY MOUTH TWICE A DAY (Patient taking differently: Take 40 mg by mouth daily. )  . Fluticasone-Salmeterol (WIXELA INHUB) 250-50 MCG/DOSE AEPB One puff twice daily  . glucose blood (ACCU-CHEK AVIVA PLUS) test strip Use as instructed to test blood sugar up to 3 times daily  . Lancets (ACCU-CHEK SOFT TOUCH) lancets Use as instructed to test blood sugar up to 3 times daily  . losartan (COZAAR) 100 MG tablet TAKE 1 TABLET BY MOUTH ONCE DAILY FOR BLOOD PRESSURE  . mirtazapine (REMERON) 15 MG tablet TAKE 1 TABLET BY MOUTH AT BEDTIME. FOR DEPRESSION AND APPETITE.  [name] OXYGEN Inhale 2 L into the lungs continuous.   . potassium chloride SA (K-DUR) 10 MEQ tablet Take 2 tablets  (20 mEq total) by mouth daily. For low potassium.  [name] spironolactone (ALDACTONE) 25 MG tablet Take 1 tablet (25 mg total) by mouth daily.               Objective:   Physical Exam  wc bound elderly bf nad /nad   06/26/2018        103 05/02/2018        108  03/12/2018        128  12/19/2017       112  11/01/2017        118  10/17/2017        114  10/05/2017        120   10/02/2017        119   07/27/16 144 lb 12.8 oz (65.7 kg)  06/02/16 136 lb 6.4 oz (61.9 kg)  11/22/15 156 lb 12.8 oz (71.1 kg)    Vital signs reviewed - Note on arrival 02 sats  94% on 2lpm POC       HEENT: Full dentures / nl  oropharynx. Nl external ear canals without cough reflex -  Mild bilateral non-specific turbinate edema     NECK :  without JVD/Nodes/TM/ nl carotid upstrokes bilaterally   LUNGS: no acc muscle use,  Mod barrel  contour chest wall with bilateral  Distant bs s audible wheeze and  without cough on insp or exp maneuver and mod  Hyperresonant  to  percussion bilaterally     CV:  RRR  no s3 or murmur or increase in P2, and no edema   ABD:  soft and nontender with pos mid insp Hoover's  in the supine position. No bruits or organomegaly appreciated, bowel sounds nl  MS:  ext warm without deformities, calf tenderness, cyanosis or clubbing No obvious joint restrictions   SKIN: warm and dry without lesions    NEURO:  alert, approp, nl sensorium with  no motor or cerebellar deficits apparent.         Assessment:

## 2018-06-27 ENCOUNTER — Encounter: Payer: Self-pay | Admitting: Internal Medicine

## 2018-06-27 ENCOUNTER — Ambulatory Visit: Payer: Medicare HMO | Admitting: Internal Medicine

## 2018-06-27 ENCOUNTER — Other Ambulatory Visit: Payer: Self-pay

## 2018-06-27 NOTE — Assessment & Plan Note (Signed)
rx per Dr McLean/ well compensated at present

## 2018-06-27 NOTE — Assessment & Plan Note (Addendum)
Quit smoking 1989 - Spirometry 09/11/2016  FEV1 1.03 (52%)  Ratio 54 mild curvature, on no rx - 09/11/2016  After extensive coaching HFA effectiveness =    50% from baseline 10% > continue  symb 160 2bid but consider change to bevespi or stiolto  - 10/02/2017  After extensive coaching inhaler device,  effectiveness =    50% from a baseline near 0 > rechallenge with symbicort 160 x 2 bid x 2 weeks then re-group  - 10/05/2017    try stiolto x 2pffs x 2 week sample  - PFT's  11/01/2017  FEV1 0.95 (52 % ) ratio fev1/vc = 69%  p 3 % improvement from saba p ? Stiolto(not sure she used it)  prior to study with DLCO  30/30c % corrects to 59 % for alv volume  With air trapping pattern  confirmed on cxr as well - 05/02/2018  After extensive coaching inhaler device,  effectiveness =    75% with dpi device   contiues to have symptoms > typical gold II copd as has cor pulmonale from emphysema/chronic hypoxemia s ILD by most recent hrct.  Pt is Group B in terms of symptom/risk and laba/lama therefore appropriate rx at this point >>>  But can only afford generic advair at this point    - The proper method of use, as well as anticipated side effects, of a metered-dose inhaler and dpi were discussed and demonstrated to the patient.

## 2018-06-27 NOTE — Assessment & Plan Note (Signed)
Assoc with ? Cor pulmonale dx 09/2017 so rx 02 1-2 lpm 24/7  - 10/17/2017  Walked RA x 3 laps @ 185 ft each stopped due to  End of study, nl pace,  desat to 87 on 3rd lap - 12/19/2017  Saturations on Room Air at Rest =91 % and while Ambulating = 87%  But  on  2 Liters of pulsed oxygen while Ambulating =93% so rec POC 2lpm walking / use at rest if sats under 90%  - HC03 05/02/2018  = 34  - HC03  06/24/18     = 26 c/w improving hypercarbic component   Adequate control on present rx, reviewed in detail with pt > no change in rx needed  = 2lpm 24/7    I had an extended discussion with the patient reviewing all relevant studies completed to date and  lasting 15 to 20 minutes of a 25 minute visit    Each maintenance medication was reviewed in detail including most importantly the difference between maintenance and prns and under what circumstances the prns are to be triggered using an action plan format that is not reflected in the computer generated alphabetically organized AVS.    See device teaching which extended face to face time for this visit    Please see AVS for specific instructions unique to this visit that I personally wrote and verbalized to the the pt in detail and then reviewed with pt  by my nurse highlighting any  changes in therapy recommended at today's visit to their plan of care.

## 2018-06-29 DIAGNOSIS — J9611 Chronic respiratory failure with hypoxia: Secondary | ICD-10-CM | POA: Diagnosis not present

## 2018-07-02 ENCOUNTER — Other Ambulatory Visit: Payer: Self-pay | Admitting: Cardiovascular Disease

## 2018-07-02 ENCOUNTER — Encounter: Payer: Self-pay | Admitting: Endocrinology

## 2018-07-02 ENCOUNTER — Other Ambulatory Visit: Payer: Self-pay

## 2018-07-02 ENCOUNTER — Ambulatory Visit (INDEPENDENT_AMBULATORY_CARE_PROVIDER_SITE_OTHER): Payer: Medicare HMO | Admitting: Endocrinology

## 2018-07-02 VITALS — BP 120/82 | HR 70 | Ht 65.0 in | Wt 103.2 lb

## 2018-07-02 DIAGNOSIS — E210 Primary hyperparathyroidism: Secondary | ICD-10-CM | POA: Diagnosis not present

## 2018-07-02 DIAGNOSIS — J9611 Chronic respiratory failure with hypoxia: Secondary | ICD-10-CM | POA: Diagnosis not present

## 2018-07-02 MED ORDER — CINACALCET HCL 30 MG PO TABS: 30.0000 mg | ORAL_TABLET | Freq: Every day | ORAL | 11 refills | Status: DC

## 2018-07-02 NOTE — Telephone Encounter (Signed)
Spoken Denise [name] gave the same information that was given to [employer] about 2 weeks ago.

## 2018-07-02 NOTE — Telephone Encounter (Signed)
Darlene @ [facility] @ home needing a call from you ASAP  BEST number [phone] ask for darlene

## 2018-07-02 NOTE — Progress Notes (Signed)
Subjective:    Patient ID: Denise [name], female    DOB: 1945/08/05, 73 y.o.   MRN: [id number]  HPI Pt returns for f/u of primary hyperparathyroidism (dx'ed 2017; she has never had osteoporosis, urolithiasis, or bony fracture; sestamibi scan in 2019 did not locate a parathyroid adenoma, but suggested MNG at the neck; surg decided she was not an operative candidate). She is pretty certain she is taking cinacalcet.  No change in chronic dyspnea.   Past Medical History:  Diagnosis Date  . Altered mental status   . Anxiety and depression   . Chronic respiratory failure with hypoxia ([place]) 10/02/2017   Assoc with ? Cor pulmonale dx 09/2017 so rx 02 1-2 lpm 24/7  - 10/17/2017  Walked RA x 3 laps @ 185 ft each stopped due to  End of study, nl pace,  desat to 87 on 3rd lap - 12/19/2017  Saturations on Room Air at Rest =91 % and while Ambulating = 87%  But  on  2 Liters of pulsed oxygen while Ambulating =93% so rec POC 2lpm walking / use at rest if sats under 90%      . Colon cancer ([place]) 1988   Resected  . COPD (chronic obstructive pulmonary disease) ([place])   . Diabetes mellitus without complication ([name])    diet controlled- no meds. per pt  . Diverticulosis   . Hyperlipidemia   . Hypertension   . Insomnia   . Primary hyperparathyroidism ([place])   . Tubular adenoma of rectum 02/23/2013   low grade  . Vitamin D deficiency     Past Surgical History:  Procedure Laterality Date  . ABDOMINAL HYSTERECTOMY  08/2015  . BREAST BIOPSY Left   . BREAST EXCISIONAL BIOPSY Right   . COLON RESECTION  1980's  . COLONOSCOPY    . ENDOMETRIAL BIOPSY  2009   negative  . INCONTINENCE SURGERY  2017  . RIGHT HEART CATH N/A 09/20/2017   Procedure: RIGHT HEART CATH;  Surgeon: Larey Dresser, MD;  Location: [facility];  Service: Cardiovascular;  Laterality: N/A;  . RIGHT/LEFT HEART CATH AND CORONARY ANGIOGRAPHY N/A 03/19/2018   Procedure: RIGHT/LEFT HEART CATH AND CORONARY ANGIOGRAPHY;  Surgeon:  Larey Dresser, MD;  Location: [facility];  Service: Cardiovascular;  Laterality: N/A;    Social History   Socioeconomic History  . Marital status: Married    Spouse name: Not on file  . Number of children: 3  . Years of education: Not on file  . Highest education level: Not on file  Occupational History  . Not on file  Social Needs  . Financial resource strain: Not on file  . Food insecurity    Worry: Not on file    Inability: Not on file  . Transportation needs    Medical: Not on file    Non-medical: Not on file  Tobacco Use  . Smoking status: Former Smoker    Packs/day: 1.00    Years: 30.00    Pack years: 30.00    Types: Cigarettes    Quit date: 1989    Years since quitting: 31.5  . Smokeless tobacco: Never Used  Substance and Sexual Activity  . Alcohol use: No  . Drug use: No  . Sexual activity: Yes    Partners: Male    Birth control/protection: Post-menopausal, Surgical  Lifestyle  . Physical activity    Days per week: Not on file    Minutes per session: Not on file  .  Stress: Not on file  Relationships  . Social Herbalist on phone: Not on file    Gets together: Not on file    Attends religious service: Not on file    Active member of club or organization: Not on file    Attends meetings of clubs or organizations: Not on file    Relationship status: Not on file  . Intimate partner violence    Fear of current or ex partner: Not on file    Emotionally abused: Not on file    Physically abused: Not on file    Forced sexual activity: Not on file  Other Topics Concern  . Not on file  Social History Narrative   Married. 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 albuterol (VENTOLIN HFA) 108 (90 Base) MCG/ACT inhaler INHALE 1-2 PUFFS INTO THE LUNGS EVERY 4 HOURS  AS NEEDED FOR WHEEZING OR SHORTNESS OF BREATH. 6.7 g 5  . aspirin EC 81 MG EC tablet Take 1 tablet (81 mg total) by mouth daily. 30 tablet 0  . atorvastatin (LIPITOR) 10 MG tablet Take 10 mg by mouth at bedtime.     [name] BIDIL 20-37.5 MG tablet TAKE 1 TABLET BY MOUTH THREE TIMES A DAY 270 tablet 0  . bisoprolol (ZEBETA) 5 MG tablet Take 0.5 tablets (2.5 mg total) by mouth daily. 45 tablet 3  . Blood Glucose Calibration (ACCU-CHEK AVIVA) SOLN USE AS INSTRUCTED TO TEST BLOOD SUGAR DAILY 1 each 2  . Blood Glucose Monitoring Suppl (ACCU-CHEK AVIVA PLUS) w/Device KIT Use as instructed to test blood sugar up to 3 times daily 1 kit 0  . famotidine (PEPCID) 40 MG tablet TAKE 1 TABLET BY MOUTH TWICE A DAY (Patient taking differently: Take 40 mg by mouth daily. ) 180 tablet 0  . Fluticasone-Salmeterol (WIXELA INHUB) 250-50 MCG/DOSE AEPB One puff twice daily    . glucose blood (ACCU-CHEK AVIVA PLUS) test strip Use as instructed to test blood sugar up to 3 times daily 300 each 1  . Lancets (ACCU-CHEK SOFT TOUCH) lancets Use as instructed to test blood sugar up to 3 times daily 300 each 1  . losartan (COZAAR) 100 MG tablet TAKE 1 TABLET BY MOUTH ONCE DAILY FOR BLOOD PRESSURE 90 tablet 0  . mirtazapine (REMERON) 15 MG tablet TAKE 1 TABLET BY MOUTH AT BEDTIME. FOR DEPRESSION AND APPETITE. 90 tablet 0  . OXYGEN Inhale 2 L into the lungs continuous.     . potassium chloride SA (K-DUR) 10 MEQ tablet Take 2 tablets (20 mEq total) by mouth daily. For low potassium. 60 tablet 3  . [DISCONTINUED] glipiZIDE (GLUCOTROL) 5 MG tablet Take 0.5 tablets (2.5 mg total) by mouth daily before breakfast. 60 tablet 0   No current facility-administered medications on file prior to visit.     No Known Allergies  Family History  Problem Relation Age of Onset  . Ovarian cancer Mother   . Breast cancer Neg Hx   . Hyperparathyroidism Neg Hx   . Colon cancer Neg Hx   . Esophageal cancer Neg Hx   . Stomach cancer Neg Hx     BP  120/82 (BP Location: Left Arm, Patient Position: Sitting, Cuff Size: Normal)   Pulse 70   Ht 5'  5" (1.651 m)   Wt 103 lb 3.2 oz (46.8 kg)   SpO2 (!) 88%   BMI 17.17 kg/m   Review of Systems Denies numbness.     Objective:   Physical Exam VITAL SIGNS:  See vs page GENERAL: no distress.  Has 02 on.   NECK: bilat thyroid nodules are palpable.  Gait: slow but steady.     Lab Results  Component Value Date   PTH 29 02/13/2018   CALCIUM 10.4 06/24/2018   CAION 1.44 (H) 03/19/2018   CAION 1.37 03/19/2018       Assessment & Plan:  Primary hyperparathyroidism: she needs increased rx.     Patient Instructions  I have sent a prescription to your pharmacy, to increase the cinacalcet to 1 pill every day.   Please come back for a follow-up appointment in 1 month, in person.

## 2018-07-02 NOTE — Patient Instructions (Addendum)
I have sent a prescription to your pharmacy, to increase the cinacalcet to 1 pill every day.   Please come back for a follow-up appointment in 1 month, in person.

## 2018-07-03 ENCOUNTER — Other Ambulatory Visit (HOSPITAL_COMMUNITY): Payer: Self-pay

## 2018-07-03 ENCOUNTER — Other Ambulatory Visit (HOSPITAL_COMMUNITY): Payer: Self-pay | Admitting: Cardiology

## 2018-07-03 ENCOUNTER — Telehealth (HOSPITAL_COMMUNITY): Payer: Self-pay

## 2018-07-03 ENCOUNTER — Telehealth: Payer: Self-pay | Admitting: Internal Medicine

## 2018-07-03 NOTE — Telephone Encounter (Signed)
I called Mrs. Monaco and her husband answered.  He stated that Mrs. Croke is doing great. We agree to meet around 3

## 2018-07-03 NOTE — Telephone Encounter (Signed)
If the pt is feeling sick ie chills, aches, cough, more cough or sob at rest on 02 than usual, they should go to ER  If the issue is just the temp of 99.3 I would just watch it and stay shelter in place

## 2018-07-03 NOTE — Telephone Encounter (Signed)
Call made to patient, made aware of MW recommendations. Voiced understanding. She denies chills, body aches, cough, or worsening SOB. She states she will take something for her fever and if her symptoms worsen she will present to ER. Reminder patient to monitor her temp for next coupe of days. Nothing further is needed at this time.

## 2018-07-03 NOTE — Telephone Encounter (Signed)
Primary Pulmonologist: Dr. Melvyn Novas Last office visit and with whom: 06/26/2018 What do we see them for (pulmonary problems): COPD GOLD II, Pulmonary HTN, Chronic Respiratory Failure w/ Hypoxia  Reason for call:  Received a transfer call from front staff. Spoke w/ D. Copper, who states she is pt's paramedic that visits her weekly. D. Copper states pt is breathing 126 times/minute, SpO2 91% on 3L continuous, temperature 99.3*F. She also  reports diminished lung sounds on pt's right lower lobe. D.Copper states pt recently started medication she spelled out as "tyvaso" once daily and states that a nurse checks in on her twice a week for this therapy. Pt is taking albuterol and Wixela as directed.    In the last month, have you been in contact with someone who was confirmed or suspected to have Con[place]avirus / COVID-19?  No  Do you have any of the following symptoms developed in the last 30 days? Fever: 99.3*F Cough: No Shortness of breath: Yes  When did your symptoms start?  07/03/2018  If the patient has a fever, what is the last reading?  (use n/a if patient denies fever)  99.3*F . IF THE PATIENT STATES THEY DO NOT OWN A THERMOMETER, THEY MUST GO AND PURCHASE ONE When did the fever start?: 07/03/2018 Have you taken any medication to suppress a fever (ie Ibuprofen, Aleve, Tylenol)?: No  MW, please advise with your recommendations for this pt. Thank you.

## 2018-07-03 NOTE — Progress Notes (Signed)
Paramedicine Encounter    Patient ID: Enid Derry A. Rampersad, female    DOB: 08/24/1945, 73 y.o.   MRN: [id number]    Patient Care Team: [name], NP as PCP - General (Internal Medicine) [name], MD as Consulting Physician (Cardiology)  Patient Active Problem List   Diagnosis Date Noted  . CHF (congestive heart failure) ([place]) 05/16/2018  . Chronic respiratory failure with hypoxia and hypercapnia ([place]) 05/03/2018  . Borderline abnormal TFTs 05/02/2018  . Type 2 diabetes mellitus ([place]) 04/26/2018  . Unintended weight loss 04/26/2018  . Diarrhea 04/02/2018  . Acute exacerbation of CHF (congestive heart failure) ([place]) 03/15/2018  . Lower extremity edema 02/20/2018  . Chronic respiratory failure with hypoxia ([place]) 10/02/2017  . Hematuria 09/27/2017  . SBO (small bowel obstruction) ([place])   . Pulmonary hypertension ([name]) c/w cor pulmonale   . Acute respiratory failure with hypoxia ([place]) 09/17/2017  . Epigastric pain 09/17/2017  . Protein-calorie malnutrition, severe 09/17/2017  . Abnormal CT scan, gastrointestinal tract   . Constipation   . Other ascites   . Hyperparathyroidism, primary ([place]) 07/27/2016  . Loss of appetite 06/02/2016  . COPD GOLD  II   11/22/2015  . Vitamin D deficiency 11/22/2015  . Anxiety and depression 11/22/2015  . Insomnia 11/22/2015  . Altered mental status 11/22/2015  . Hyperlipidemia 11/22/2015  . Essential hypertension 11/22/2015  . History of adenomatous polyp of colon 02/10/2013  . History of colon cancer 06/11/1986    Current Outpatient Medications:  .  aspirin EC 81 MG EC tablet, Take 1 tablet (81 mg total) by mouth daily., Disp: 30 tablet, Rfl: 0 .  atorvastatin (LIPITOR) 10 MG tablet, Take 10 mg by mouth at bedtime. , Disp: , Rfl:  .  BIDIL 20-37.5 MG tablet, TAKE 1 TABLET BY MOUTH THREE TIMES A DAY, Disp: 270 tablet, Rfl: 0 .  bisoprolol (ZEBETA) 5 MG tablet, Take 0.5 tablets (2.5 mg total) by mouth daily., Disp: 45 tablet,  Rfl: 3 .  cinacalcet (SENSIPAR) 30 MG tablet, Take 1 tablet (30 mg total) by mouth daily., Disp: 30 tablet, Rfl: 11 .  famotidine (PEPCID) 40 MG tablet, TAKE 1 TABLET BY MOUTH TWICE A DAY (Patient taking differently: Take 40 mg by mouth daily. ), Disp: 180 tablet, Rfl: 0 .  Fluticasone-Salmeterol (WIXELA INHUB) 250-50 MCG/DOSE AEPB, One puff twice daily, Disp: , Rfl:  .  losartan (COZAAR) 100 MG tablet, TAKE 1 TABLET BY MOUTH ONCE DAILY FOR BLOOD PRESSURE, Disp: 90 tablet, Rfl: 0 .  mirtazapine (REMERON) 15 MG tablet, TAKE 1 TABLET BY MOUTH AT BEDTIME. FOR DEPRESSION AND APPETITE., Disp: 90 tablet, Rfl: 0 .  OXYGEN, Inhale 2 L into the lungs continuous. , Disp: , Rfl:  .  potassium chloride SA (K-DUR) 10 MEQ tablet, Take 2 tablets (20 mEq total) by mouth daily. For low potassium., Disp: 60 tablet, Rfl: 3 .  spironolactone (ALDACTONE) 25 MG tablet, TAKE 1 TABLET BY MOUTH EVERY DAY, Disp: 30 tablet, Rfl: 6 .  albuterol (VENTOLIN HFA) 108 (90 Base) MCG/ACT inhaler, INHALE 1-2 PUFFS INTO THE LUNGS EVERY 4 HOURS AS NEEDED FOR WHEEZING OR SHORTNESS OF BREATH., Disp: 6.7 g, Rfl: 5 .  Blood Glucose Calibration (ACCU-CHEK AVIVA) SOLN, USE AS INSTRUCTED TO TEST BLOOD SUGAR DAILY, Disp: 1 each, Rfl: 2 .  Blood Glucose Monitoring Suppl (ACCU-CHEK AVIVA PLUS) w/Device KIT, Use as instructed to test blood sugar up to 3 times daily, Disp: 1 kit, Rfl: 0 .  glucose blood (ACCU-CHEK AVIVA  PLUS) test strip, Use as instructed to test blood sugar up to 3 times daily, Disp: 300 each, Rfl: 1 .  Lancets (ACCU-CHEK SOFT TOUCH) lancets, Use as instructed to test blood sugar up to 3 times daily, Disp: 300 each, Rfl: 1 No Known Allergies   Social History   Socioeconomic History  . Marital status: Married    Spouse name: Not on file  . Number of children: 3  . Years of education: Not on file  . Highest education level: Not on file  Occupational History  . Not on file  Social Needs  . Financial resource strain: Not on  file  . Food insecurity    Worry: Not on file    Inability: Not on file  . Transportation needs    Medical: Not on file    Non-medical: Not on file  Tobacco Use  . Smoking status: Former Smoker    Packs/day: 1.00    Years: 30.00    Pack years: 30.00    Types: Cigarettes    Quit date: 1989    Years since quitting: 31.5  . Smokeless tobacco: Never Used  Substance and Sexual Activity  . Alcohol use: No  . Drug use: No  . Sexual activity: Yes    Partners: Male    Birth control/protection: Post-menopausal, Surgical  Lifestyle  . Physical activity    Days per week: Not on file    Minutes per session: Not on file  . Stress: Not on file  Relationships  . Social Herbalist on phone: Not on file    Gets together: Not on file    Attends religious service: Not on file    Active member of club or organization: Not on file    Attends meetings of clubs or organizations: Not on file    Relationship status: Not on file  . Intimate partner violence    Fear of current or ex partner: Not on file    Emotionally abused: Not on file    Physically abused: Not on file    Forced sexual activity: Not on file  Other Topics Concern  . Not on file  Social History Narrative   Married. 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     Breath sounds: No wheezing or rales.  Chest:     Chest wall: No tenderness.  Musculoskeletal:     Right lower leg: No edema.     Left lower leg: No edema.  Skin:    General: Skin is warm and dry.         Future Appointments  Date Time Provider [place]  07/09/2018 12:10 PM [facility] MM 2 [facility]  07/12/2018 10:00 AM Gatha Mayer, MD [facility]  08/01/2018  1:00 PM Renato Shin, MD [facility] None     BP 94/60 (BP Location: Left Arm, Patient Position: Sitting,  Cuff Size: Normal)   Pulse 75   Temp 99.3 F (37.4 C)   Resp (!) 26   Wt 106 lb (48.1 kg)   BMI 17.64 kg/m  CBG 108 per pt   Weight yesterday-106 Last visit weight-103  ATF pt CAO x4 sitting at the kitchen table.  She's currently breathing about 25 times a min. Mrs. Buske said that she  used the  Machine about 1 o'clock today.  She was told by the RN that she may have sob after she takes it.  [name] breathing is shallow; air movement in both left lung fields and the right upper; she's diminished in the right lower lobe. There's no wheezing or rales noted during my assessment.  She said that after she administered the treatment she began to cough and the sob began. Pt denies chest pain and chest wall pain. Pt also has a low grade fever of 99.3. she denies "knowlingly being around someone with Covid-19 symptoms". She also denies chills, loss of appetite, body aches and diarrhea. However, she has been to scheduled doctor visits this week.    I spoke with Raquel Sarna @ Dr. Melvyn Novas office about Mrs. Vanhoose signs and symptoms.  She asked if pt had used the rescue inhaler today, pt said yes but she couldn't remember the last time she used it today.  Pt stated that she will use the inhaler when I leave.  I advised pt and her husband that someone from Dr. Melvyn Novas office will be giving her a call back.  I also told her to call 911 or get a ride to the emergency room if her symptoms got worse.     I also spoke with Alma Friendly, NP with with internal medicine about possible Covid-19 testing due to her presentation today. Carlis Abbott, said that pneumonia is another concern of Mrs. Bourassa also.  She said that she will discuss the same with her staff.  Clark advised me to let the family and pt know of the same, which I did via voicemail.    Medication ordered: None  Madeleine Fenn, EMT Paramedic [phone] 07/03/2018    ACTION: Home visit completed

## 2018-07-04 ENCOUNTER — Telehealth (HOSPITAL_COMMUNITY): Payer: Self-pay

## 2018-07-04 NOTE — Telephone Encounter (Signed)
Orders signed and faxed to [facility]. Confirmation received. Copy placed in [facility] folder

## 2018-07-04 NOTE — Telephone Encounter (Signed)
Received fax from [facility]. Pt has started Tyvaso 07/01/18. Pt and husband educated on medication and side effects. Pt and spouse were able to demonstrate assembly of machine and administer treatment without difficulty. Pt start med without side effect or difficulty.  Vitals: 110/64; 68bpm before meds After meds 102/58; 62bpm  Next visit 07/09/18

## 2018-07-08 DIAGNOSIS — I270 Primary pulmonary hypertension: Secondary | ICD-10-CM | POA: Diagnosis not present

## 2018-07-09 ENCOUNTER — Ambulatory Visit
Admission: RE | Admit: 2018-07-09 | Discharge: 2018-07-09 | Disposition: A | Discharge status: Home / Self Care | Payer: Medicare HMO | Source: Ambulatory Visit | Attending: Primary Care | Admitting: Primary Care

## 2018-07-09 DIAGNOSIS — Z1231 Encounter for screening mammogram for malignant neoplasm of breast: Secondary | ICD-10-CM | POA: Diagnosis not present

## 2018-07-09 NOTE — Telephone Encounter (Signed)
Darlene @ [facility] is needing a call back ASAP Best Number [phone] ask Amalia Greenhouse believes there is some confusion on what is being asked by her. She wants to confirm if the patient needs nursing at home.

## 2018-07-09 NOTE — Telephone Encounter (Signed)
Spoke with Denise [name] regarding concerns and questions. We will add [facility] nursing for medication management as she always seems confused about her medications and what to take when in the office.

## 2018-07-10 ENCOUNTER — Other Ambulatory Visit (HOSPITAL_COMMUNITY): Payer: Self-pay

## 2018-07-10 NOTE — Progress Notes (Signed)
Paramedicine Encounter    Patient ID: Denise [name], female    DOB: 29-Mar-1945, 73 y.o.   MRN: [id number]    Patient Care Team: [name], NP as PCP - General (Internal Medicine) [name], MD as Consulting Physician (Cardiology)  Patient Active Problem List   Diagnosis Date Noted  . CHF (congestive heart failure) ([place]) 05/16/2018  . Chronic respiratory failure with hypoxia and hypercapnia ([place]) 05/03/2018  . Borderline abnormal TFTs 05/02/2018  . Type 2 diabetes mellitus ([place]) 04/26/2018  . Unintended weight loss 04/26/2018  . Diarrhea 04/02/2018  . Acute exacerbation of CHF (congestive heart failure) ([place]) 03/15/2018  . Lower extremity edema 02/20/2018  . Chronic respiratory failure with hypoxia ([place]) 10/02/2017  . Hematuria 09/27/2017  . SBO (small bowel obstruction) ([place])   . Pulmonary hypertension ([name]) c/w cor pulmonale   . Acute respiratory failure with hypoxia ([place]) 09/17/2017  . Epigastric pain 09/17/2017  . Protein-calorie malnutrition, severe 09/17/2017  . Abnormal CT scan, gastrointestinal tract   . Constipation   . Other ascites   . Hyperparathyroidism, primary ([place]) 07/27/2016  . Loss of appetite 06/02/2016  . COPD GOLD  II   11/22/2015  . Vitamin D deficiency 11/22/2015  . Anxiety and depression 11/22/2015  . Insomnia 11/22/2015  . Altered mental status 11/22/2015  . Hyperlipidemia 11/22/2015  . Essential hypertension 11/22/2015  . History of adenomatous polyp of colon 02/10/2013  . History of colon cancer 06/11/1986    Current Outpatient Medications:  .  aspirin EC 81 MG EC tablet, Take 1 tablet (81 mg total) by mouth daily., Disp: 30 tablet, Rfl: 0 .  atorvastatin (LIPITOR) 10 MG tablet, Take 10 mg by mouth at bedtime. , Disp: , Rfl:  .  BIDIL 20-37.5 MG tablet, TAKE 1 TABLET BY MOUTH THREE TIMES A DAY, Disp: 270 tablet, Rfl: 0 .  bisoprolol (ZEBETA) 5 MG tablet, Take 0.5 tablets (2.5 mg total) by mouth daily., Disp: 45 tablet,  Rfl: 3 .  cinacalcet (SENSIPAR) 30 MG tablet, Take 1 tablet (30 mg total) by mouth daily., Disp: 30 tablet, Rfl: 11 .  famotidine (PEPCID) 40 MG tablet, TAKE 1 TABLET BY MOUTH TWICE A DAY (Patient taking differently: Take 40 mg by mouth daily. ), Disp: 180 tablet, Rfl: 0 .  losartan (COZAAR) 100 MG tablet, TAKE 1 TABLET BY MOUTH ONCE DAILY FOR BLOOD PRESSURE, Disp: 90 tablet, Rfl: 0 .  mirtazapine (REMERON) 15 MG tablet, TAKE 1 TABLET BY MOUTH AT BEDTIME. FOR DEPRESSION AND APPETITE., Disp: 90 tablet, Rfl: 0 .  potassium chloride SA (K-DUR) 10 MEQ tablet, Take 2 tablets (20 mEq total) by mouth daily. For low potassium., Disp: 60 tablet, Rfl: 3 .  spironolactone (ALDACTONE) 25 MG tablet, TAKE 1 TABLET BY MOUTH EVERY DAY, Disp: 30 tablet, Rfl: 6 .  albuterol (VENTOLIN HFA) 108 (90 Base) MCG/ACT inhaler, INHALE 1-2 PUFFS INTO THE LUNGS EVERY 4 HOURS AS NEEDED FOR WHEEZING OR SHORTNESS OF BREATH., Disp: 6.7 g, Rfl: 5 .  Blood Glucose Calibration (ACCU-CHEK AVIVA) SOLN, USE AS INSTRUCTED TO TEST BLOOD SUGAR DAILY, Disp: 1 each, Rfl: 2 .  Blood Glucose Monitoring Suppl (ACCU-CHEK AVIVA PLUS) w/Device KIT, Use as instructed to test blood sugar up to 3 times daily, Disp: 1 kit, Rfl: 0 .  Fluticasone-Salmeterol (WIXELA INHUB) 250-50 MCG/DOSE AEPB, One puff twice daily, Disp: , Rfl:  .  glucose blood (ACCU-CHEK AVIVA PLUS) test strip, Use as instructed to test blood sugar up to 3 times daily,  Disp: 300 each, Rfl: 1 .  Lancets (ACCU-CHEK SOFT TOUCH) lancets, Use as instructed to test blood sugar up to 3 times daily, Disp: 300 each, Rfl: 1 .  OXYGEN, Inhale 2 L into the lungs continuous. , Disp: , Rfl:  No Known Allergies   Social History   Socioeconomic History  . Marital status: Married    Spouse name: Not on file  . Number of children: 3  . Years of education: Not on file  . Highest education level: Not on file  Occupational History  . Not on file  Social Needs  . Financial resource strain: Not on  file  . Food insecurity    Worry: Not on file    Inability: Not on file  . Transportation needs    Medical: Not on file    Non-medical: Not on file  Tobacco Use  . Smoking status: Former Smoker    Packs/day: 1.00    Years: 30.00    Pack years: 30.00    Types: Cigarettes    Quit date: 1989    Years since quitting: 31.5  . Smokeless tobacco: Never Used  Substance and Sexual Activity  . Alcohol use: No  . Drug use: No  . Sexual activity: Yes    Partners: Male    Birth control/protection: Post-menopausal, Surgical  Lifestyle  . Physical activity    Days per week: Not on file    Minutes per session: Not on file  . Stress: Not on file  Relationships  . Social Herbalist on phone: Not on file    Gets together: Not on file    Attends religious service: Not on file    Active member of club or organization: Not on file    Attends meetings of clubs or organizations: Not on file    Relationship status: Not on file  . Intimate partner violence    Fear of current or ex partner: Not on file    Emotionally abused: Not on file    Physically abused: Not on file    Forced sexual activity: Not on file  Other Topics Concern  . Not on file  Social History Narrative   Married. 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     Breath sounds: No wheezing or rales.  Musculoskeletal:     Right lower leg: No edema.     Left lower leg: No edema.  Skin:    General: Skin is warm and dry.         Future Appointments  Date Time Provider [place]  07/12/2018 10:00 AM Gatha Mayer, MD [facility]  08/01/2018  1:00 PM Renato Shin, MD [facility] None     BP 138/80   Pulse 73   Temp 99.2 F (37.3 C)   Wt 101 lb 9.6 oz (46.1 kg)   SpO2 90%   BMI 16.91 kg/m   Weight yesterday-101 Last visit  weight-106  ATF pt CAO x4 walking to the kitchen.  She stated that she feel a lot better than last week.  She said that the RN came by and showed her how to use the nebulizer correctly.  She demonstrated that correct way to use it what she was doing wrong before. She denies sob, dizziness and  chest pain today. She has taken all of meds this week. Pt has constipation, the RN gave her the name of a over the counter medicine to take.   rx bottles verified and pill box completed.  She has been trying to refill it on her own.    Mr. Crumbley didn't sound too sure about her weight, it's either 101.6 or 106.1. Be advised next visit.   Medication ordered: None   , EMT Paramedic [phone] 07/10/2018    ACTION: Home visit completed

## 2018-07-11 ENCOUNTER — Encounter: Payer: Self-pay | Admitting: General Surgery

## 2018-07-11 ENCOUNTER — Other Ambulatory Visit: Payer: Self-pay | Admitting: Internal Medicine

## 2018-07-12 ENCOUNTER — Ambulatory Visit (INDEPENDENT_AMBULATORY_CARE_PROVIDER_SITE_OTHER): Payer: Medicare HMO | Admitting: Internal Medicine

## 2018-07-12 ENCOUNTER — Other Ambulatory Visit: Payer: Self-pay

## 2018-07-12 ENCOUNTER — Encounter: Payer: Self-pay | Admitting: Internal Medicine

## 2018-07-12 VITALS — Ht 66.0 in | Wt 101.0 lb

## 2018-07-12 DIAGNOSIS — Z86010 Personal history of colonic polyps: Secondary | ICD-10-CM

## 2018-07-12 DIAGNOSIS — K5900 Constipation, unspecified: Secondary | ICD-10-CM

## 2018-07-12 DIAGNOSIS — Z85038 Personal history of other malignant neoplasm of large intestine: Secondary | ICD-10-CM

## 2018-07-12 DIAGNOSIS — Z9981 Dependence on supplemental oxygen: Secondary | ICD-10-CM

## 2018-07-12 HISTORY — DX: Dependence on supplemental oxygen: Z99.81

## 2018-07-12 NOTE — Assessment & Plan Note (Signed)
This makes her an ASA class IV and would require any procedures to be done at the hospital.  I am not inclined to continue routine colonoscopy in this lady.

## 2018-07-12 NOTE — Assessment & Plan Note (Addendum)
Will treat with Dulcolax and MiraLAX.  See patient instructions.  But plan is for a mild MiraLAX purge and then regular MiraLAX if needed. She will follow-up in 4 to 6 weeks, in office if not doing well versus telehealth if doing well.  If she feels like everything is going well she may cancel the follow-up be scheduled as well.

## 2018-07-12 NOTE — Patient Instructions (Signed)
It was good to hear your voice today.  I am sorry you are having so many health problems right now.  Regarding your constipation, as we discussed, I asked that you do the following:  Purchase over-the-counter Dulcolax and take 2 tablets  Wait 1 hour and drink MiraLAX 4 doses over about an hour  This should clean you out pretty well, if it does not help you let me know by calling back.   Then tried taking 1 dose of MiraLAX every day or every other day, or consider a half a dose a day if it makes you go too much.  Hopefully this will maintain bowel regularity.  It is also important to take all of your other medications as prescribed as your medical conditions can influence your bowel function and if we treat your heart failure, hyperparathyroidism with elevated calcium, and other problems properly you may move your bowels better.   We will make a follow-up appointment for August in about a month or so to have a recheck, if you feel like you are doing fine you can cancel this.  If you are generally okay but still want a follow-up we can do it over the phone again but if you are not making progress I think I will need to see you in the office.  We should be able to figure it out at the time but please keep this in mind as the appointment approaches because we are intending to try to make most of our visits if not all in August in person in the office.   I appreciate the opportunity to care for you. Gatha Mayer, MD, [facility]

## 2018-07-12 NOTE — Assessment & Plan Note (Signed)
This is noted, technically she would be due for a repeat colonoscopy 5 years since the past but I think her comorbidities preclude that.

## 2018-07-12 NOTE — Assessment & Plan Note (Signed)
I think her comorbidities preclude polyp surveillance.

## 2018-07-12 NOTE — Progress Notes (Signed)
TELEHEALTH ENCOUNTER IN SETTING OF COVID-19 PANDEMIC - REQUESTED BY PATIENT SERVICE PROVIDED BY TELEMEDECINE - TYPE: Telephone PATIENT LOCATION: Home PATIENT HAS CONSENTED TO TELEHEALTH VISIT PROVIDER LOCATION: OFFICE REFERRING PROVIDER: [name], NP PARTICIPANTS OTHER THAN PATIENT: Denise [name] TIME SPENT ON CALL 16 minutes and 45 seconds    Denise [name] 73 y.o. 1945-10-29 [id number]  Assessment & Plan:   Constipation Will treat with Dulcolax and MiraLAX.  See patient instructions.  But plan is for a mild MiraLAX purge and then regular MiraLAX if needed. She will follow-up in 4 to 6 weeks, in office if not doing well versus telehealth if doing well.  If she feels like everything is going well she may cancel the follow-up be scheduled as well.  History of colon cancer This is noted, technically she would be due for a repeat colonoscopy 5 years since the past but I think her comorbidities preclude that.  Hx of colonic polyps I think her comorbidities preclude polyp surveillance.  On home oxygen therapy This makes her Denise [name] ASA class IV and would require any procedures to be done at the hospital.  I am not inclined to continue routine colonoscopy in this lady.  I appreciate the opportunity to care for this patient. CC: Denise Koch, NP    Subjective:   Chief Complaint: History of colon cancer and polyps, discuss colonoscopy  HPI Denise [name] is a very nice 73 year old African-American woman who has a remote history of resection of colon cancer in 1988 and a history of colon polyps last having had at least one adenoma removed, several small polyps removed a colonoscopy when she lived in [place], 2015.  Over the past year or so her health is deteriorated and she has cor pulmonale, on oxygen therapy at home and emphysema.  She gets short of breath just walking in the house.  Steps make it much worse.  Her main complaint today is that she is not moved her bowels well in the  last week with only small bits of stool despite the urge.  She has not had any rectal bleeding.  She reports that her appetite is improved compared to where it was.  I can see from chart review that she has not been adhering to her medication regimen properly and Denise [name], Dr. Aundra [name] and Dr. Loanne [name] are all working on that and treatment of her cardiopulmonary problems and her primary hyperparathyroidism.   She also denies abdominal distention or any significant abdominal pain.  Nausea that she had last year is much better now that her calcium is under better control.  However it is still elevated and it was 10.4 on June 22.  Her brain natruretic peptide was 840 on the same date.   Wt Readings from Last 3 Encounters:  07/12/18 101 lb (45.8 kg)  07/11/18 101 lb 6 oz (46 kg)  07/10/18 101 lb 9.6 oz (46.1 kg)   Lab Results  Component Value Date   WBC 4.8 05/02/2018   HGB 13.0 05/02/2018   HCT 38.5 05/02/2018   MCV 84.7 05/02/2018   PLT 206.0 05/02/2018    No Known Allergies Current Meds  Medication Sig  . albuterol (VENTOLIN HFA) 108 (90 Base) MCG/ACT inhaler INHALE 1-2 PUFFS INTO THE LUNGS EVERY 4 HOURS AS NEEDED FOR WHEEZING OR SHORTNESS OF BREATH.  [name] aspirin EC 81 MG EC tablet Take 1 tablet (81 mg total) by mouth daily.  [name] atorvastatin (LIPITOR) 10 MG tablet Take 10 mg by mouth at  bedtime.   [name] BIDIL 20-37.5 MG tablet TAKE 1 TABLET BY MOUTH THREE TIMES A DAY  . bisoprolol (ZEBETA) 5 MG tablet Take 0.5 tablets (2.5 mg total) by mouth daily.  . Blood Glucose Calibration (ACCU-CHEK AVIVA) SOLN USE AS INSTRUCTED TO TEST BLOOD SUGAR DAILY  . Blood Glucose Monitoring Suppl (ACCU-CHEK AVIVA PLUS) w/Device KIT Use as instructed to test blood sugar up to 3 times daily  . cinacalcet (SENSIPAR) 30 MG tablet Take 1 tablet (30 mg total) by mouth daily.  . famotidine (PEPCID) 40 MG tablet TAKE 1 TABLET BY MOUTH TWICE A DAY  . Fluticasone-Salmeterol (WIXELA INHUB) 250-50 MCG/DOSE AEPB One puff twice  daily  . glucose blood (ACCU-CHEK AVIVA PLUS) test strip Use as instructed to test blood sugar up to 3 times daily  . Lancets (ACCU-CHEK SOFT TOUCH) lancets Use as instructed to test blood sugar up to 3 times daily  . losartan (COZAAR) 100 MG tablet TAKE 1 TABLET BY MOUTH ONCE DAILY FOR BLOOD PRESSURE  . mirtazapine (REMERON) 15 MG tablet TAKE 1 TABLET BY MOUTH AT BEDTIME. FOR DEPRESSION AND APPETITE.  [name] OXYGEN Inhale 2 L into the lungs continuous.   . potassium chloride SA (K-DUR) 10 MEQ tablet Take 2 tablets (20 mEq total) by mouth daily. For low potassium.  [name] spironolactone (ALDACTONE) 25 MG tablet TAKE 1 TABLET BY MOUTH EVERY DAY   Past Medical History:  Diagnosis Date  . Altered mental status   . Anxiety and depression   . Chronic respiratory failure with hypoxia ([place]) 10/02/2017   Assoc with ? Cor pulmonale dx 09/2017 so rx 02 1-2 lpm 24/7  - 10/17/2017  Walked RA x 3 laps @ 185 ft each stopped due to  End of study, nl pace,  desat to 87 on 3rd lap - 12/19/2017  Saturations on Room Air at Rest =91 % and while Ambulating = 87%  But  on  2 Liters of pulsed oxygen while Ambulating =93% so rec POC 2lpm walking / use at rest if sats under 90%      . Colon cancer ([place]) 1988   Resected  . COPD (chronic obstructive pulmonary disease) ([place])   . Diabetes mellitus without complication ([name])    diet controlled- no meds. per pt  . Diarrhea 04/02/2018  . Diverticulosis   . Hyperlipidemia   . Hypertension   . Insomnia   . Primary hyperparathyroidism ([place])   . SBO (small bowel obstruction) ([place])   . Tubular adenoma of rectum 02/23/2013   low grade  . Vitamin D deficiency    Past Surgical History:  Procedure Laterality Date  . ABDOMINAL HYSTERECTOMY  08/2015  . BREAST BIOPSY Left   . BREAST EXCISIONAL BIOPSY Right   . COLON RESECTION  1980's  . COLONOSCOPY    . ENDOMETRIAL BIOPSY  2009   negative  . INCONTINENCE SURGERY  2017  . RIGHT HEART CATH N/A 09/20/2017   Procedure: RIGHT HEART  CATH;  Surgeon: Larey Dresser, MD;  Location: [facility];  Service: Cardiovascular;  Laterality: N/A;  . RIGHT/LEFT HEART CATH AND CORONARY ANGIOGRAPHY N/A 03/19/2018   Procedure: RIGHT/LEFT HEART CATH AND CORONARY ANGIOGRAPHY;  Surgeon: Larey Dresser, MD;  Location: [facility];  Service: Cardiovascular;  Laterality: N/A;   Social History   Social History Narrative   Married. 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   Review of Systems As per HPI

## 2018-07-17 ENCOUNTER — Other Ambulatory Visit: Payer: Self-pay | Admitting: Primary Care

## 2018-07-17 DIAGNOSIS — I10 Essential (primary) hypertension: Secondary | ICD-10-CM

## 2018-07-18 ENCOUNTER — Other Ambulatory Visit (HOSPITAL_COMMUNITY): Payer: Self-pay

## 2018-07-18 NOTE — Progress Notes (Signed)
Paramedicine Encounter    Patient ID: Denise [name], female    DOB: 1945/08/03, 73 y.o.   MRN: [id number]    Patient Care Team: [name], NP as PCP - General (Internal Medicine) [name], MD as Consulting Physician (Cardiology)  Patient Active Problem List   Diagnosis Date Noted  . On home oxygen therapy 07/12/2018  . CHF (congestive heart failure) ([place]) 05/16/2018  . Chronic respiratory failure with hypoxia and hypercapnia ([place]) 05/03/2018  . Borderline abnormal TFTs 05/02/2018  . Type 2 diabetes mellitus ([place]) 04/26/2018  . Unintended weight loss 04/26/2018  . Lower extremity edema 02/20/2018  . Chronic respiratory failure with hypoxia ([place]) 10/02/2017  . Hematuria 09/27/2017  . Pulmonary hypertension ([name]) c/w cor pulmonale   . Protein-calorie malnutrition, severe 09/17/2017  . Constipation   . Hyperparathyroidism, primary ([place]) 07/27/2016  . Loss of appetite 06/02/2016  . COPD GOLD  II   11/22/2015  . Vitamin D deficiency 11/22/2015  . Anxiety and depression 11/22/2015  . Insomnia 11/22/2015  . Hyperlipidemia 11/22/2015  . Essential hypertension 11/22/2015  . Hx of colonic polyps 02/10/2013  . History of colon cancer 06/11/1986    Current Outpatient Medications:  .  aspirin EC 81 MG EC tablet, Take 1 tablet (81 mg total) by mouth daily., Disp: 30 tablet, Rfl: 0 .  atorvastatin (LIPITOR) 10 MG tablet, Take 10 mg by mouth at bedtime. , Disp: , Rfl:  .  BIDIL 20-37.5 MG tablet, TAKE 1 TABLET BY MOUTH THREE TIMES A DAY, Disp: 270 tablet, Rfl: 0 .  bisoprolol (ZEBETA) 5 MG tablet, Take 0.5 tablets (2.5 mg total) by mouth daily., Disp: 45 tablet, Rfl: 3 .  cinacalcet (SENSIPAR) 30 MG tablet, Take 1 tablet (30 mg total) by mouth daily., Disp: 30 tablet, Rfl: 11 .  famotidine (PEPCID) 40 MG tablet, TAKE 1 TABLET BY MOUTH TWICE A DAY, Disp: 180 tablet, Rfl: 0 .  losartan (COZAAR) 100 MG tablet, TAKE 1 TABLET BY MOUTH ONCE DAILY FOR BLOOD PRESSURE, Disp:  90 tablet, Rfl: 1 .  mirtazapine (REMERON) 15 MG tablet, TAKE 1 TABLET BY MOUTH AT BEDTIME. FOR DEPRESSION AND APPETITE., Disp: 90 tablet, Rfl: 0 .  potassium chloride SA (K-DUR) 10 MEQ tablet, Take 2 tablets (20 mEq total) by mouth daily. For low potassium., Disp: 60 tablet, Rfl: 3 .  spironolactone (ALDACTONE) 25 MG tablet, TAKE 1 TABLET BY MOUTH EVERY DAY, Disp: 30 tablet, Rfl: 6 .  albuterol (VENTOLIN HFA) 108 (90 Base) MCG/ACT inhaler, INHALE 1-2 PUFFS INTO THE LUNGS EVERY 4 HOURS AS NEEDED FOR WHEEZING OR SHORTNESS OF BREATH., Disp: 6.7 g, Rfl: 5 .  Blood Glucose Calibration (ACCU-CHEK AVIVA) SOLN, USE AS INSTRUCTED TO TEST BLOOD SUGAR DAILY, Disp: 1 each, Rfl: 2 .  Blood Glucose Monitoring Suppl (ACCU-CHEK AVIVA PLUS) w/Device KIT, Use as instructed to test blood sugar up to 3 times daily, Disp: 1 kit, Rfl: 0 .  Fluticasone-Salmeterol (WIXELA INHUB) 250-50 MCG/DOSE AEPB, One puff twice daily, Disp: , Rfl:  .  glucose blood (ACCU-CHEK AVIVA PLUS) test strip, Use as instructed to test blood sugar up to 3 times daily, Disp: 300 each, Rfl: 1 .  Lancets (ACCU-CHEK SOFT TOUCH) lancets, Use as instructed to test blood sugar up to 3 times daily, Disp: 300 each, Rfl: 1 .  OXYGEN, Inhale 2 L into the lungs continuous. , Disp: , Rfl:  No Known Allergies   Social History   Socioeconomic History  . Marital status: Married  Spouse name: Not on file  . Number of children: 3  . Years of education: Not on file  . Highest education level: Not on file  Occupational History  . Not on file  Social Needs  . Financial resource strain: Not on file  . Food insecurity    Worry: Not on file    Inability: Not on file  . Transportation needs    Medical: Not on file    Non-medical: Not on file  Tobacco Use  . Smoking status: Former Smoker    Packs/day: 1.00    Years: 30.00    Pack years: 30.00    Types: Cigarettes    Quit date: 1989    Years since quitting: 31.5  . Smokeless tobacco: Never Used   Substance and Sexual Activity  . Alcohol use: No  . Drug use: No  . Sexual activity: Yes    Partners: Male    Birth control/protection: Post-menopausal, Surgical  Lifestyle  . Physical activity    Days per week: Not on file    Minutes per session: Not on file  . Stress: Not on file  Relationships  . Social Herbalist on phone: Not on file    Gets together: Not on file    Attends religious service: Not on file    Active member of club or organization: Not on file    Attends meetings of clubs or organizations: Not on file    Relationship status: Not on file  . Intimate partner violence    Fear of current or ex partner: Not on file    Emotionally abused: Not on file    Physically abused: Not on file    Forced sexual activity: Not on file  Other Topics Concern  . Not on file  Social History Narrative   Married. 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 No respiratory distress.     Breath sounds: No wheezing or rales.  Musculoskeletal:     Right lower leg: No edema.     Left lower leg: No edema.  Skin:    General: Skin is warm and dry.         Future Appointments  Date Time Provider [place]  08/01/2018  1:00 PM Renato Shin, MD [facility] None  08/20/2018  1:30 PM Gatha Mayer, MD [facility]     BP 106/68 (BP Location: Left Arm, Patient Position: Sitting, Cuff Size: Normal)   Pulse 72   Temp 98.5 F (36.9 C) (Temporal)   Wt 105 lb 6.4 oz (47.8 kg)   SpO2 90%   BMI 17.01 kg/m   Weight yesterday-didn't weigh Last visit weight-101  ATF pt CAO to her norm sitting on the porch. She said that she feels good.  She denies chest pain, dizziness and worsening sob.  She's still wearing oxygen daily; she's taken all of her medications for the past week.  Her husband said that her  appetite is still really good. rx bottles verified and pill box refilled. She's still working on refilling her pill box on her own; I filled two boxes for her.  I told her and her husband that I will be on vacation next week and to call heart failure clinic if she need someone to check  on her while I'm out of the office.     Medication ordered:  bisoprolol  Rupa Lagan, EMT Paramedic [phone] 07/19/2018    ACTION: Home visit completed

## 2018-07-20 ENCOUNTER — Other Ambulatory Visit: Payer: Self-pay | Admitting: Primary Care

## 2018-07-22 NOTE — Telephone Encounter (Signed)
Medication have not prescribe. Last appointment on 06/24/2018 . No future appointment

## 2018-07-29 ENCOUNTER — Telehealth (HOSPITAL_COMMUNITY): Payer: Self-pay

## 2018-07-29 DIAGNOSIS — J9611 Chronic respiratory failure with hypoxia: Secondary | ICD-10-CM | POA: Diagnosis not present

## 2018-07-29 NOTE — Telephone Encounter (Signed)
Spoke to [place] confirming appointment for tomorrow at 1030.

## 2018-07-30 ENCOUNTER — Other Ambulatory Visit (HOSPITAL_COMMUNITY): Payer: Self-pay

## 2018-07-30 NOTE — Progress Notes (Signed)
Paramedicine Encounter    Patient ID: Denise [name], female    DOB: 02/21/1945, 73 y.o.   MRN: [id number]   Patient Care Team: [name], NP as PCP - General (Internal Medicine) [name], MD as Consulting Physician (Cardiology)  Patient Active Problem List   Diagnosis Date Noted  . On home oxygen therapy 07/12/2018  . CHF (congestive heart failure) ([place]) 05/16/2018  . Chronic respiratory failure with hypoxia and hypercapnia ([place]) 05/03/2018  . Borderline abnormal TFTs 05/02/2018  . Type 2 diabetes mellitus ([place]) 04/26/2018  . Unintended weight loss 04/26/2018  . Lower extremity edema 02/20/2018  . Chronic respiratory failure with hypoxia ([place]) 10/02/2017  . Hematuria 09/27/2017  . Pulmonary hypertension ([name]) c/w cor pulmonale   . Protein-calorie malnutrition, severe 09/17/2017  . Constipation   . Hyperparathyroidism, primary ([place]) 07/27/2016  . Loss of appetite 06/02/2016  . COPD GOLD  II   11/22/2015  . Vitamin D deficiency 11/22/2015  . Anxiety and depression 11/22/2015  . Insomnia 11/22/2015  . Hyperlipidemia 11/22/2015  . Essential hypertension 11/22/2015  . Hx of colonic polyps 02/10/2013  . History of colon cancer 06/11/1986    Current Outpatient Medications:  .  albuterol (VENTOLIN HFA) 108 (90 Base) MCG/ACT inhaler, INHALE 1-2 PUFFS INTO THE LUNGS EVERY 4 HOURS AS NEEDED FOR WHEEZING OR SHORTNESS OF BREATH., Disp: 6.7 g, Rfl: 5 .  aspirin EC 81 MG EC tablet, Take 1 tablet (81 mg total) by mouth daily., Disp: 30 tablet, Rfl: 0 .  atorvastatin (LIPITOR) 10 MG tablet, TAKE 1 TABLET BY MOUTH EVERYDAY AT BEDTIME, Disp: 90 tablet, Rfl: 3 .  BIDIL 20-37.5 MG tablet, TAKE 1 TABLET BY MOUTH THREE TIMES A DAY, Disp: 270 tablet, Rfl: 0 .  Blood Glucose Calibration (ACCU-CHEK AVIVA) SOLN, USE AS INSTRUCTED TO TEST BLOOD SUGAR DAILY, Disp: 1 each, Rfl: 2 .  Blood Glucose Monitoring Suppl (ACCU-CHEK AVIVA PLUS) w/Device KIT, Use as instructed to test blood  sugar up to 3 times daily, Disp: 1 kit, Rfl: 0 .  cinacalcet (SENSIPAR) 30 MG tablet, Take 1 tablet (30 mg total) by mouth daily., Disp: 30 tablet, Rfl: 11 .  famotidine (PEPCID) 40 MG tablet, TAKE 1 TABLET BY MOUTH TWICE A DAY, Disp: 180 tablet, Rfl: 0 .  Fluticasone-Salmeterol (WIXELA INHUB) 250-50 MCG/DOSE AEPB, One puff twice daily, Disp: , Rfl:  .  glucose blood (ACCU-CHEK AVIVA PLUS) test strip, Use as instructed to test blood sugar up to 3 times daily, Disp: 300 each, Rfl: 1 .  Lancets (ACCU-CHEK SOFT TOUCH) lancets, Use as instructed to test blood sugar up to 3 times daily, Disp: 300 each, Rfl: 1 .  losartan (COZAAR) 100 MG tablet, TAKE 1 TABLET BY MOUTH ONCE DAILY FOR BLOOD PRESSURE, Disp: 90 tablet, Rfl: 1 .  mirtazapine (REMERON) 15 MG tablet, TAKE 1 TABLET BY MOUTH AT BEDTIME. FOR DEPRESSION AND APPETITE., Disp: 90 tablet, Rfl: 0 .  OXYGEN, Inhale 2 L into the lungs continuous. , Disp: , Rfl:  .  potassium chloride SA (K-DUR) 10 MEQ tablet, Take 2 tablets (20 mEq total) by mouth daily. For low potassium., Disp: 60 tablet, Rfl: 3 .  spironolactone (ALDACTONE) 25 MG tablet, TAKE 1 TABLET BY MOUTH EVERY DAY, Disp: 30 tablet, Rfl: 6 .  bisoprolol (ZEBETA) 5 MG tablet, Take 0.5 tablets (2.5 mg total) by mouth daily. (Patient not taking: Reported on 07/30/2018), Disp: 45 tablet, Rfl: 3 No Known Allergies   Social History   Socioeconomic History  .  Marital status: Married    Spouse name: Not on file  . Number of children: 3  . Years of education: Not on file  . Highest education level: Not on file  Occupational History  . Not on file  Social Needs  . Financial resource strain: Not on file  . Food insecurity    Worry: Not on file    Inability: Not on file  . Transportation needs    Medical: Not on file    Non-medical: Not on file  Tobacco Use  . Smoking status: Former Smoker    Packs/day: 1.00    Years: 30.00    Pack years: 30.00    Types: Cigarettes    Quit date: 1989     Years since quitting: 31.5  . Smokeless tobacco: Never Used  Substance and Sexual Activity  . Alcohol use: No  . Drug use: No  . Sexual activity: Yes    Partners: Male    Birth control/protection: Post-menopausal, Surgical  Lifestyle  . Physical activity    Days per week: Not on file    Minutes per session: Not on file  . Stress: Not on file  Relationships  . Social Herbalist on phone: Not on file    Gets together: Not on file    Attends religious service: Not on file    Active member of club or organization: Not on file    Attends meetings of clubs or organizations: Not on file    Relationship status: Not on file  . Intimate partner violence    Fear of current or ex partner: Not on file    Emotionally abused: Not on file    Physically abused: Not on file    Forced sexual activity: Not on file  Other Topics Concern  . Not on file  Social History Narrative   Married. 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  HENT:     Head: Normocephalic.     Nose: Nose normal.     Mouth/Throat:     Mouth: Mucous membranes are moist.  Eyes:     Pupils: Pupils are equal, round, and reactive to light.  Cardiovascular:     Rate and Rhythm: Normal rate and regular rhythm.     Pulses: Normal pulses.  Pulmonary:     Effort: Pulmonary effort is normal.     Breath sounds: Normal breath sounds.     Comments: Short of breath upon walking. Abdominal:     General: Abdomen is flat. There is no distension.     Palpations: Abdomen is soft.  Musculoskeletal: Normal range of motion.     Right lower leg: No edema.     Left lower leg: No edema.  Skin:    General: Skin is warm and dry.     Capillary Refill: Capillary refill takes less than 2 seconds.  Neurological:     Mental Status: She is alert. Mental status is at baseline.  Psychiatric:        Mood and  Affect: Mood normal.     Arrived for home visit for [facility]. Denise [name] was walking down the hall upon my arrival she appeared short of breath while wearing her oxygen. Denise [name] stated she gets short winded upon walking normally. Denise [name] was seated in a chair and was able  to catch her breath. Vitals were obtained and are as recorded. Medications were reviewed and filled. 2 pill boxes were filled for Denise [name]. Patient noted to be missing Bisoprolol. [facility]. Patient had no complaints upon assessment. Home visit complete. Patient agreed to visit upon Dee's return to work.   WEIGHT: 101lbs  CBG: 170   MEDS ORDERED: -Bisoprolol     Future Appointments  Date Time Provider [place]  08/01/2018  1:00 PM Renato Shin, MD [facility] None  08/20/2018  1:30 PM Gatha Mayer, MD [facility]     ACTION: Home visit completed Next visit planned for upon Dee's return to work

## 2018-08-01 ENCOUNTER — Encounter: Payer: Self-pay | Admitting: Endocrinology

## 2018-08-01 ENCOUNTER — Other Ambulatory Visit: Payer: Self-pay

## 2018-08-01 ENCOUNTER — Ambulatory Visit (INDEPENDENT_AMBULATORY_CARE_PROVIDER_SITE_OTHER): Payer: Medicare HMO | Admitting: Endocrinology

## 2018-08-01 VITALS — BP 158/82 | HR 81 | Ht 66.0 in | Wt 104.6 lb

## 2018-08-01 DIAGNOSIS — E559 Vitamin D deficiency, unspecified: Secondary | ICD-10-CM | POA: Diagnosis not present

## 2018-08-01 LAB — VITAMIN D 25 HYDROXY (VIT D DEFICIENCY, FRACTURES): VITD: 52.62 ng/mL (ref 30.00–100.00)

## 2018-08-01 NOTE — Progress Notes (Signed)
Subjective:    Patient ID: Denise [name], female    DOB: Jun 02, 1945, 73 y.o.   MRN: [id number]  HPI Pt returns for f/u of primary hyperparathyroidism (dx'ed 2017; she has never had osteoporosis, urolithiasis, or bony fracture; sestamibi scan in 2019 did not locate a parathyroid adenoma, but suggested MNG at the neck; surg decided she was not an operative candidate). she takes cinacalcet as rx'ed.  No change in chronic dyspnea sensation in the chest, but no assoc muscle weakness.   Past Medical History:  Diagnosis Date  . Altered mental status   . Anxiety and depression   . Chronic respiratory failure with hypoxia ([place]) 10/02/2017   Assoc with ? Cor pulmonale dx 09/2017 so rx 02 1-2 lpm 24/7  - 10/17/2017  Walked RA x 3 laps @ 185 ft each stopped due to  End of study, nl pace,  desat to 87 on 3rd lap - 12/19/2017  Saturations on Room Air at Rest =91 % and while Ambulating = 87%  But  on  2 Liters of pulsed oxygen while Ambulating =93% so rec POC 2lpm walking / use at rest if sats under 90%      . Colon cancer ([place]) 1988   Resected  . COPD (chronic obstructive pulmonary disease) ([place])   . Diabetes mellitus without complication ([name])    diet controlled- no meds. per pt  . Diarrhea 04/02/2018  . Diverticulosis   . Hyperlipidemia   . Hypertension   . Insomnia   . Primary hyperparathyroidism ([place])   . SBO (small bowel obstruction) ([place])   . Tubular adenoma of rectum 02/23/2013   low grade  . Vitamin D deficiency     Past Surgical History:  Procedure Laterality Date  . ABDOMINAL HYSTERECTOMY  08/2015  . BREAST BIOPSY Left   . BREAST EXCISIONAL BIOPSY Right   . COLON RESECTION  1980's  . COLONOSCOPY    . ENDOMETRIAL BIOPSY  2009   negative  . INCONTINENCE SURGERY  2017  . RIGHT HEART CATH N/A 09/20/2017   Procedure: RIGHT HEART CATH;  Surgeon: Larey Dresser, MD;  Location: [facility];  Service: Cardiovascular;  Laterality: N/A;  . RIGHT/LEFT HEART CATH AND CORONARY  ANGIOGRAPHY N/A 03/19/2018   Procedure: RIGHT/LEFT HEART CATH AND CORONARY ANGIOGRAPHY;  Surgeon: Larey Dresser, MD;  Location: [facility];  Service: Cardiovascular;  Laterality: N/A;    Social History   Socioeconomic History  . Marital status: Married    Spouse name: Not on file  . Number of children: 3  . Years of education: Not on file  . Highest education level: Not on file  Occupational History  . Not on file  Social Needs  . Financial resource strain: Not on file  . Food insecurity    Worry: Not on file    Inability: Not on file  . Transportation needs    Medical: Not on file    Non-medical: Not on file  Tobacco Use  . Smoking status: Former Smoker    Packs/day: 1.00    Years: 30.00    Pack years: 30.00    Types: Cigarettes    Quit date: 1989    Years since quitting: 31.6  . Smokeless tobacco: Never Used  Substance and Sexual Activity  . Alcohol use: No  . Drug use: No  . Sexual activity: Yes    Partners: Male    Birth control/protection: Post-menopausal, Surgical  Lifestyle  . Physical activity  Days per week: Not on file    Minutes per session: Not on file  . Stress: Not on file  Relationships  . Social Herbalist on phone: Not on file    Gets together: Not on file    Attends religious service: Not on file    Active member of club or organization: Not on file    Attends meetings of clubs or organizations: Not on file    Relationship status: Not on file  . Intimate partner violence    Fear of current or ex partner: Not on file    Emotionally abused: Not on file    Physically abused: Not on file    Forced sexual activity: Not on file  Other Topics Concern  . Not on file  Social History Narrative   Married. 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  albuterol (VENTOLIN HFA) 108 (90 Base) MCG/ACT inhaler INHALE 1-2 PUFFS INTO THE LUNGS EVERY 4 HOURS AS NEEDED FOR WHEEZING OR SHORTNESS OF BREATH. 6.7 g 5  . aspirin EC 81 MG EC tablet Take 1 tablet (81 mg total) by mouth daily. 30 tablet 0  . atorvastatin (LIPITOR) 10 MG tablet TAKE 1 TABLET BY MOUTH EVERYDAY AT BEDTIME 90 tablet 3  . BIDIL 20-37.5 MG tablet TAKE 1 TABLET BY MOUTH THREE TIMES A DAY 270 tablet 0  . bisoprolol (ZEBETA) 5 MG tablet Take 0.5 tablets (2.5 mg total) by mouth daily. 45 tablet 3  . Blood Glucose Calibration (ACCU-CHEK AVIVA) SOLN USE AS INSTRUCTED TO TEST BLOOD SUGAR DAILY 1 each 2  . Blood Glucose Monitoring Suppl (ACCU-CHEK AVIVA PLUS) w/Device KIT Use as instructed to test blood sugar up to 3 times daily 1 kit 0  . cinacalcet (SENSIPAR) 30 MG tablet Take 1 tablet (30 mg total) by mouth daily. 30 tablet 11  . famotidine (PEPCID) 40 MG tablet TAKE 1 TABLET BY MOUTH TWICE A DAY 180 tablet 0  . Fluticasone-Salmeterol (WIXELA INHUB) 250-50 MCG/DOSE AEPB One puff twice daily    . glucose blood (ACCU-CHEK AVIVA PLUS) test strip Use as instructed to test blood sugar up to 3 times daily 300 each 1  . Lancets (ACCU-CHEK SOFT TOUCH) lancets Use as instructed to test blood sugar up to 3 times daily 300 each 1  . losartan (COZAAR) 100 MG tablet TAKE 1 TABLET BY MOUTH ONCE DAILY FOR BLOOD PRESSURE 90 tablet 1  . mirtazapine (REMERON) 15 MG tablet TAKE 1 TABLET BY MOUTH AT BEDTIME. FOR DEPRESSION AND APPETITE. 90 tablet 0  . OXYGEN Inhale 2 L into the lungs continuous.     . potassium chloride SA (K-DUR) 10 MEQ tablet Take 2 tablets (20 mEq total) by mouth daily. For low potassium. 60 tablet 3  . spironolactone (ALDACTONE) 25 MG tablet TAKE 1 TABLET BY MOUTH EVERY DAY 30 tablet 6  . [DISCONTINUED] glipiZIDE (GLUCOTROL) 5 MG tablet Take 0.5 tablets (2.5 mg total) by mouth daily before breakfast. 60 tablet 0   No current facility-administered medications on file prior to visit.      No Known Allergies  Family History  Problem Relation Age of Onset  . Ovarian cancer Mother   . Breast cancer Neg Hx   . Hyperparathyroidism Neg Hx   .  Colon cancer Neg Hx   . Esophageal cancer Neg Hx   . Stomach cancer Neg Hx     BP (!) 158/82 (BP Location: Right Arm, Patient Position: Sitting, Cuff Size: Normal)   Pulse 81   Ht _0  (1.676 m)   Wt 104 lb 9.6 oz (47.4 kg)   SpO2 90%   BMI 16.88 kg/m    Review of Systems Denies muscle cramps and numbness.      Objective:   Physical Exam VITAL SIGNS:  See vs page GENERAL: no distress.  In wheelchair.     Lab Results  Component Value Date   TSH 0.65 05/02/2018   T4TOTAL 7.3 05/02/2018   Lab Results  Component Value Date   PTH 84 (H) 08/01/2018   CALCIUM 10.0 08/01/2018   CAION 1.44 (H) 03/19/2018   CAION 1.37 03/19/2018       Assessment & Plan:  Primary hyperparathyroidism: recheck today.  COPD: not a surgical candidate.  HTN: is noted today.   Patient Instructions  Your blood pressure is high today.  Please see your primary care provider soon, to have it rechecked Blood tests are requested for you today.  We'll let you know about the results.  Please come back for a follow-up appointment in 2 months.

## 2018-08-01 NOTE — Patient Instructions (Addendum)
Your blood pressure is high today.  Please see your primary care provider soon, to have it rechecked Blood tests are requested for you today.  We'll let you know about the results.  Please come back for a follow-up appointment in 2 months.

## 2018-08-02 ENCOUNTER — Telehealth: Payer: Self-pay

## 2018-08-02 DIAGNOSIS — J9611 Chronic respiratory failure with hypoxia: Secondary | ICD-10-CM | POA: Diagnosis not present

## 2018-08-02 LAB — PTH, INTACT AND CALCIUM
Calcium: 10 mg/dL (ref 8.6–10.4)
PTH: 84 pg/mL — ABNORMAL HIGH (ref 14–64)

## 2018-08-02 NOTE — Telephone Encounter (Signed)
Has she contacted her pulmonologist?  If not then I recommend this. Also, if her breathing is getting worse, especially while on 3 L of oxygen then she needs to go to the emergency department.

## 2018-08-02 NOTE — Telephone Encounter (Signed)
Message left for patient to return my call.

## 2018-08-02 NOTE — Telephone Encounter (Signed)
Pt left a message on triage vm stating she was hang an issue with her breathing. Feeling really short of breath. She has used her albuterol inhaler and is now out of it. It helped a little. She is on 3 Liters Oxygen continuous. She has not taken Breo yet. Please advise at [phone] or husband's cell at  [phone]

## 2018-08-05 DIAGNOSIS — J188 Other pneumonia, unspecified organism: Secondary | ICD-10-CM

## 2018-08-05 DIAGNOSIS — Z87891 Personal history of nicotine dependence: Secondary | ICD-10-CM

## 2018-08-05 DIAGNOSIS — K5790 Diverticulosis of intestine, part unspecified, without perforation or abscess without bleeding: Secondary | ICD-10-CM

## 2018-08-05 DIAGNOSIS — Z7984 Long term (current) use of oral hypoglycemic drugs: Secondary | ICD-10-CM

## 2018-08-05 DIAGNOSIS — J440 Chronic obstructive pulmonary disease with acute lower respiratory infection: Secondary | ICD-10-CM | POA: Diagnosis not present

## 2018-08-05 DIAGNOSIS — I5033 Acute on chronic diastolic (congestive) heart failure: Secondary | ICD-10-CM | POA: Diagnosis not present

## 2018-08-05 DIAGNOSIS — I272 Pulmonary hypertension, unspecified: Secondary | ICD-10-CM

## 2018-08-05 DIAGNOSIS — J9621 Acute and chronic respiratory failure with hypoxia: Secondary | ICD-10-CM

## 2018-08-05 DIAGNOSIS — Z86010 Personal history of colonic polyps: Secondary | ICD-10-CM

## 2018-08-05 DIAGNOSIS — I082 Rheumatic disorders of both aortic and tricuspid valves: Secondary | ICD-10-CM

## 2018-08-05 DIAGNOSIS — E785 Hyperlipidemia, unspecified: Secondary | ICD-10-CM

## 2018-08-05 DIAGNOSIS — E559 Vitamin D deficiency, unspecified: Secondary | ICD-10-CM

## 2018-08-05 DIAGNOSIS — E43 Unspecified severe protein-calorie malnutrition: Secondary | ICD-10-CM

## 2018-08-05 DIAGNOSIS — E119 Type 2 diabetes mellitus without complications: Secondary | ICD-10-CM

## 2018-08-05 DIAGNOSIS — Z7982 Long term (current) use of aspirin: Secondary | ICD-10-CM

## 2018-08-05 DIAGNOSIS — Z85038 Personal history of other malignant neoplasm of large intestine: Secondary | ICD-10-CM

## 2018-08-05 DIAGNOSIS — I2510 Atherosclerotic heart disease of native coronary artery without angina pectoris: Secondary | ICD-10-CM

## 2018-08-05 DIAGNOSIS — Z9981 Dependence on supplemental oxygen: Secondary | ICD-10-CM

## 2018-08-05 DIAGNOSIS — Z7951 Long term (current) use of inhaled steroids: Secondary | ICD-10-CM

## 2018-08-05 DIAGNOSIS — Z9181 History of falling: Secondary | ICD-10-CM

## 2018-08-05 DIAGNOSIS — I110 Hypertensive heart disease with heart failure: Secondary | ICD-10-CM | POA: Diagnosis not present

## 2018-08-05 DIAGNOSIS — F329 Major depressive disorder, single episode, unspecified: Secondary | ICD-10-CM

## 2018-08-05 DIAGNOSIS — J441 Chronic obstructive pulmonary disease with (acute) exacerbation: Secondary | ICD-10-CM | POA: Diagnosis not present

## 2018-08-05 DIAGNOSIS — F419 Anxiety disorder, unspecified: Secondary | ICD-10-CM

## 2018-08-05 DIAGNOSIS — G4700 Insomnia, unspecified: Secondary | ICD-10-CM

## 2018-08-06 ENCOUNTER — Other Ambulatory Visit: Payer: Self-pay | Admitting: *Deleted

## 2018-08-06 DIAGNOSIS — G4700 Insomnia, unspecified: Secondary | ICD-10-CM

## 2018-08-06 NOTE — Telephone Encounter (Signed)
Please notify patient that she should have a few weeks left from the old Rx. Remind her to only take one capsule at bedtime.  I will go ahead and refill but we'll need to watch her future refills closely. Which pharmacy? [facility]? Please send #90 no refills.

## 2018-08-06 NOTE — Telephone Encounter (Signed)
Patient called stating that she needs a refill on her Mirtazapine which is her sleep medication.  Last refill 05/28/18 #90 [facility] Was advised by patient that she has taken all of what she got from [facility] office visit .06/24/18

## 2018-08-07 ENCOUNTER — Other Ambulatory Visit (HOSPITAL_COMMUNITY): Payer: Self-pay

## 2018-08-07 NOTE — Progress Notes (Signed)
Paramedicine Encounter    Patient ID: Denise [name], female    DOB: April 06, 1945, 73 y.o.   MRN: [id number]    Patient Care Team: [name], NP as PCP - General (Internal Medicine) [name], MD as Consulting Physician (Cardiology)  Patient Active Problem List   Diagnosis Date Noted  . On home oxygen therapy 07/12/2018  . CHF (congestive heart failure) ([place]) 05/16/2018  . Chronic respiratory failure with hypoxia and hypercapnia ([place]) 05/03/2018  . Borderline abnormal TFTs 05/02/2018  . Type 2 diabetes mellitus ([place]) 04/26/2018  . Unintended weight loss 04/26/2018  . Lower extremity edema 02/20/2018  . Chronic respiratory failure with hypoxia ([place]) 10/02/2017  . Hematuria 09/27/2017  . Pulmonary hypertension ([name]) c/w cor pulmonale   . Protein-calorie malnutrition, severe 09/17/2017  . Constipation   . Hyperparathyroidism, primary ([place]) 07/27/2016  . Loss of appetite 06/02/2016  . COPD GOLD  II   11/22/2015  . Vitamin D deficiency 11/22/2015  . Anxiety and depression 11/22/2015  . Insomnia 11/22/2015  . Hyperlipidemia 11/22/2015  . Essential hypertension 11/22/2015  . Hx of colonic polyps 02/10/2013  . History of colon cancer 06/11/1986    Current Outpatient Medications:  .  aspirin EC 81 MG EC tablet, Take 1 tablet (81 mg total) by mouth daily., Disp: 30 tablet, Rfl: 0 .  atorvastatin (LIPITOR) 10 MG tablet, TAKE 1 TABLET BY MOUTH EVERYDAY AT BEDTIME, Disp: 90 tablet, Rfl: 3 .  BIDIL 20-37.5 MG tablet, TAKE 1 TABLET BY MOUTH THREE TIMES A DAY, Disp: 270 tablet, Rfl: 0 .  cinacalcet (SENSIPAR) 30 MG tablet, Take 1 tablet (30 mg total) by mouth daily., Disp: 30 tablet, Rfl: 11 .  famotidine (PEPCID) 40 MG tablet, TAKE 1 TABLET BY MOUTH TWICE A DAY, Disp: 180 tablet, Rfl: 0 .  losartan (COZAAR) 100 MG tablet, TAKE 1 TABLET BY MOUTH ONCE DAILY FOR BLOOD PRESSURE, Disp: 90 tablet, Rfl: 1 .  potassium chloride SA (K-DUR) 10 MEQ tablet, Take 2 tablets (20 mEq  total) by mouth daily. For low potassium., Disp: 60 tablet, Rfl: 3 .  spironolactone (ALDACTONE) 25 MG tablet, TAKE 1 TABLET BY MOUTH EVERY DAY, Disp: 30 tablet, Rfl: 6 .  albuterol (VENTOLIN HFA) 108 (90 Base) MCG/ACT inhaler, INHALE 1-2 PUFFS INTO THE LUNGS EVERY 4 HOURS AS NEEDED FOR WHEEZING OR SHORTNESS OF BREATH., Disp: 6.7 g, Rfl: 5 .  bisoprolol (ZEBETA) 5 MG tablet, Take 0.5 tablets (2.5 mg total) by mouth daily., Disp: 45 tablet, Rfl: 3 .  Blood Glucose Calibration (ACCU-CHEK AVIVA) SOLN, USE AS INSTRUCTED TO TEST BLOOD SUGAR DAILY, Disp: 1 each, Rfl: 2 .  Blood Glucose Monitoring Suppl (ACCU-CHEK AVIVA PLUS) w/Device KIT, Use as instructed to test blood sugar up to 3 times daily, Disp: 1 kit, Rfl: 0 .  Fluticasone-Salmeterol (WIXELA INHUB) 250-50 MCG/DOSE AEPB, One puff twice daily, Disp: , Rfl:  .  glucose blood (ACCU-CHEK AVIVA PLUS) test strip, Use as instructed to test blood sugar up to 3 times daily, Disp: 300 each, Rfl: 1 .  Lancets (ACCU-CHEK SOFT TOUCH) lancets, Use as instructed to test blood sugar up to 3 times daily, Disp: 300 each, Rfl: 1 .  mirtazapine (REMERON) 15 MG tablet, TAKE 1 TABLET BY MOUTH AT BEDTIME. FOR DEPRESSION AND APPETITE., Disp: 90 tablet, Rfl: 0 .  OXYGEN, Inhale 2 L into the lungs continuous. , Disp: , Rfl:  No Known Allergies   Social History   Socioeconomic History  . Marital status: Married  Spouse name: Not on file  . Number of children: 3  . Years of education: Not on file  . Highest education level: Not on file  Occupational History  . Not on file  Social Needs  . Financial resource strain: Not on file  . Food insecurity    Worry: Not on file    Inability: Not on file  . Transportation needs    Medical: Not on file    Non-medical: Not on file  Tobacco Use  . Smoking status: Former Smoker    Packs/day: 1.00    Years: 30.00    Pack years: 30.00    Types: Cigarettes    Quit date: 1989    Years since quitting: 31.6  . Smokeless  tobacco: Never Used  Substance and Sexual Activity  . Alcohol use: No  . Drug use: No  . Sexual activity: Yes    Partners: Male    Birth control/protection: Post-menopausal, Surgical  Lifestyle  . Physical activity    Days per week: Not on file    Minutes per session: Not on file  . Stress: Not on file  Relationships  . Social Herbalist on phone: Not on file    Gets together: Not on file    Attends religious service: Not on file    Active member of club or organization: Not on file    Attends meetings of clubs or organizations: Not on file    Relationship status: Not on file  . Intimate partner violence    Fear of current or ex partner: Not on file    Emotionally abused: Not on file    Physically abused: Not on file    Forced sexual activity: Not on file  Other Topics Concern  . Not on file  Social History Narrative   Married. 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     Breath sounds: No wheezing.  Abdominal:     General: There is no distension.  Musculoskeletal:        General: No swelling.     Right lower leg: No edema.     Left lower leg: No edema.  Skin:    General: Skin is warm and dry.         Future Appointments  Date Time Provider [place]  08/20/2018  1:30 PM Gatha Mayer, MD [facility]     BP 110/80 (BP Location: Left Arm, Patient Position: Sitting, Cuff Size: Normal)   Pulse 67   Temp 99.3 F (37.4 C)   Wt 104 lb (47.2 kg)   SpO2 95%   BMI 16.79 kg/m   Weight yesterday-101 Last visit weight-104  ATF pt CAO x4 sitting in the kitchen w/no complaints.  She said that she feels great; she's taken all of her medications since Nira Conn seen her last week.  Pt denies sob, chest pain, dizziness and bleeding.  Her husband called the help line for assistance with her  "nebulizer machine". He said that he understands how to use it now.  rx bottles verified and pill box refilled (2).     Medication ordered: bisoprolol  Crystallee Werden, EMT Paramedic [phone] 08/07/2018    ACTION: Home visit completed

## 2018-08-07 NOTE — Telephone Encounter (Signed)
Message left for patient to return my call.  

## 2018-08-09 NOTE — Telephone Encounter (Signed)
Spoke with patient. She did get 90 tablets from [facility] at the end of May. I advised patient she should have enough medication still until the end of August. She will look to see if there are any extra bottles they missed. [facility] and [facility]ensed #17 tablets only. I called and spoke with pharmacist. The system for her was set up at some point to have all of her medications lined up to be filled at the same time that is why it was filled for #17 only. They fixed this. Patient can get the other #12 to make it 30 day supply but they are not able to fill 90 day supply at this time since it was already filled. Patient advised and will get in touch with pharmacy as she needs this medication filled.

## 2018-08-13 DIAGNOSIS — I270 Primary pulmonary hypertension: Secondary | ICD-10-CM | POA: Diagnosis not present

## 2018-08-14 ENCOUNTER — Other Ambulatory Visit (HOSPITAL_COMMUNITY): Payer: Self-pay

## 2018-08-14 NOTE — Telephone Encounter (Signed)
Noted.

## 2018-08-14 NOTE — Progress Notes (Signed)
Paramedicine Encounter    Patient ID: Denise [name], female    DOB: April 06, 1945, 73 y.o.   MRN: [id number]    Patient Care Team: [name], NP as PCP - General (Internal Medicine) [name], MD as Consulting Physician (Cardiology)  Patient Active Problem List   Diagnosis Date Noted  . On home oxygen therapy 07/12/2018  . CHF (congestive heart failure) ([place]) 05/16/2018  . Chronic respiratory failure with hypoxia and hypercapnia ([place]) 05/03/2018  . Borderline abnormal TFTs 05/02/2018  . Type 2 diabetes mellitus ([place]) 04/26/2018  . Unintended weight loss 04/26/2018  . Lower extremity edema 02/20/2018  . Chronic respiratory failure with hypoxia ([place]) 10/02/2017  . Hematuria 09/27/2017  . Pulmonary hypertension ([name]) c/w cor pulmonale   . Protein-calorie malnutrition, severe 09/17/2017  . Constipation   . Hyperparathyroidism, primary ([place]) 07/27/2016  . Loss of appetite 06/02/2016  . COPD GOLD  II   11/22/2015  . Vitamin D deficiency 11/22/2015  . Anxiety and depression 11/22/2015  . Insomnia 11/22/2015  . Hyperlipidemia 11/22/2015  . Essential hypertension 11/22/2015  . Hx of colonic polyps 02/10/2013  . History of colon cancer 06/11/1986    Current Outpatient Medications:  .  aspirin EC 81 MG EC tablet, Take 1 tablet (81 mg total) by mouth daily., Disp: 30 tablet, Rfl: 0 .  atorvastatin (LIPITOR) 10 MG tablet, TAKE 1 TABLET BY MOUTH EVERYDAY AT BEDTIME, Disp: 90 tablet, Rfl: 3 .  BIDIL 20-37.5 MG tablet, TAKE 1 TABLET BY MOUTH THREE TIMES A DAY, Disp: 270 tablet, Rfl: 0 .  cinacalcet (SENSIPAR) 30 MG tablet, Take 1 tablet (30 mg total) by mouth daily., Disp: 30 tablet, Rfl: 11 .  famotidine (PEPCID) 40 MG tablet, TAKE 1 TABLET BY MOUTH TWICE A DAY, Disp: 180 tablet, Rfl: 0 .  losartan (COZAAR) 100 MG tablet, TAKE 1 TABLET BY MOUTH ONCE DAILY FOR BLOOD PRESSURE, Disp: 90 tablet, Rfl: 1 .  potassium chloride SA (K-DUR) 10 MEQ tablet, Take 2 tablets (20 mEq  total) by mouth daily. For low potassium., Disp: 60 tablet, Rfl: 3 .  spironolactone (ALDACTONE) 25 MG tablet, TAKE 1 TABLET BY MOUTH EVERY DAY, Disp: 30 tablet, Rfl: 6 .  albuterol (VENTOLIN HFA) 108 (90 Base) MCG/ACT inhaler, INHALE 1-2 PUFFS INTO THE LUNGS EVERY 4 HOURS AS NEEDED FOR WHEEZING OR SHORTNESS OF BREATH., Disp: 6.7 g, Rfl: 5 .  bisoprolol (ZEBETA) 5 MG tablet, Take 0.5 tablets (2.5 mg total) by mouth daily., Disp: 45 tablet, Rfl: 3 .  Blood Glucose Calibration (ACCU-CHEK AVIVA) SOLN, USE AS INSTRUCTED TO TEST BLOOD SUGAR DAILY, Disp: 1 each, Rfl: 2 .  Blood Glucose Monitoring Suppl (ACCU-CHEK AVIVA PLUS) w/Device KIT, Use as instructed to test blood sugar up to 3 times daily, Disp: 1 kit, Rfl: 0 .  Fluticasone-Salmeterol (WIXELA INHUB) 250-50 MCG/DOSE AEPB, One puff twice daily, Disp: , Rfl:  .  glucose blood (ACCU-CHEK AVIVA PLUS) test strip, Use as instructed to test blood sugar up to 3 times daily, Disp: 300 each, Rfl: 1 .  Lancets (ACCU-CHEK SOFT TOUCH) lancets, Use as instructed to test blood sugar up to 3 times daily, Disp: 300 each, Rfl: 1 .  mirtazapine (REMERON) 15 MG tablet, TAKE 1 TABLET BY MOUTH AT BEDTIME. FOR DEPRESSION AND APPETITE., Disp: 90 tablet, Rfl: 0 .  OXYGEN, Inhale 2 L into the lungs continuous. , Disp: , Rfl:  No Known Allergies   Social History   Socioeconomic History  . Marital status: Married  Spouse name: Not on file  . Number of children: 3  . Years of education: Not on file  . Highest education level: Not on file  Occupational History  . Not on file  Social Needs  . Financial resource strain: Not on file  . Food insecurity    Worry: Not on file    Inability: Not on file  . Transportation needs    Medical: Not on file    Non-medical: Not on file  Tobacco Use  . Smoking status: Former Smoker    Packs/day: 1.00    Years: 30.00    Pack years: 30.00    Types: Cigarettes    Quit date: 1989    Years since quitting: 31.6  . Smokeless  tobacco: Never Used  Substance and Sexual Activity  . Alcohol use: No  . Drug use: No  . Sexual activity: Yes    Partners: Male    Birth control/protection: Post-menopausal, Surgical  Lifestyle  . Physical activity    Days per week: Not on file    Minutes per session: Not on file  . Stress: Not on file  Relationships  . Social Herbalist on phone: Not on file    Gets together: Not on file    Attends religious service: Not on file    Active member of club or organization: Not on file    Attends meetings of clubs or organizations: Not on file    Relationship status: Not on file  . Intimate partner violence    Fear of current or ex partner: Not on file    Emotionally abused: Not on file    Physically abused: Not on file    Forced sexual activity: Not on file  Other Topics Concern  . Not on file  Social History Narrative   Married. 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  She missed all of her afternoon and evening medications.  Mrs. Baize said that she takes her "sleep medication" and falls asleep before she takes her meds.  We discussed ways to remind her when to take her medications.  Her husband said that pt wanted to "take control of her meds herself".  She denies sob, chest pain and dizziness.  Pt has no complaints at this time.  rx bottles verified and pill box refilled.    Medication ordered:  Bisoprolol  Kately Graffam, EMT Paramedic [phone] 08/14/2018    ACTION: Home visit  completed

## 2018-08-20 ENCOUNTER — Encounter: Payer: Self-pay | Admitting: Internal Medicine

## 2018-08-20 ENCOUNTER — Ambulatory Visit (INDEPENDENT_AMBULATORY_CARE_PROVIDER_SITE_OTHER): Payer: Medicare HMO | Admitting: Internal Medicine

## 2018-08-20 ENCOUNTER — Other Ambulatory Visit: Payer: Self-pay

## 2018-08-20 DIAGNOSIS — K5900 Constipation, unspecified: Secondary | ICD-10-CM

## 2018-08-20 NOTE — Progress Notes (Signed)
Anwar A. Oats 73 y.o. August 21, 1945 [id number]  Assessment & Plan:      Subjective:   Chief Complaint:  HPI  No Known Allergies Current Meds  Medication Sig  . albuterol (VENTOLIN HFA) 108 (90 Base) MCG/ACT inhaler INHALE 1-2 PUFFS INTO THE LUNGS EVERY 4 HOURS AS NEEDED FOR WHEEZING OR SHORTNESS OF BREATH.  [name] aspirin EC 81 MG EC tablet Take 1 tablet (81 mg total) by mouth daily.  [name] atorvastatin (LIPITOR) 10 MG tablet TAKE 1 TABLET BY MOUTH EVERYDAY AT BEDTIME  . BIDIL 20-37.5 MG tablet TAKE 1 TABLET BY MOUTH THREE TIMES A DAY  . bisoprolol (ZEBETA) 5 MG tablet Take 0.5 tablets (2.5 mg total) by mouth daily.  . Blood Glucose Calibration (ACCU-CHEK AVIVA) SOLN USE AS INSTRUCTED TO TEST BLOOD SUGAR DAILY  . Blood Glucose Monitoring Suppl (ACCU-CHEK AVIVA PLUS) w/Device KIT Use as instructed to test blood sugar up to 3 times daily  . cinacalcet (SENSIPAR) 30 MG tablet Take 1 tablet (30 mg total) by mouth daily.  . famotidine (PEPCID) 40 MG tablet TAKE 1 TABLET BY MOUTH TWICE A DAY  . Fluticasone-Salmeterol (WIXELA INHUB) 250-50 MCG/DOSE AEPB One puff twice daily  . glucose blood (ACCU-CHEK AVIVA PLUS) test strip Use as instructed to test blood sugar up to 3 times daily  . Lancets (ACCU-CHEK SOFT TOUCH) lancets Use as instructed to test blood sugar up to 3 times daily  . losartan (COZAAR) 100 MG tablet TAKE 1 TABLET BY MOUTH ONCE DAILY FOR BLOOD PRESSURE  . mirtazapine (REMERON) 15 MG tablet TAKE 1 TABLET BY MOUTH AT BEDTIME. FOR DEPRESSION AND APPETITE.  [name] OXYGEN Inhale 2 L into the lungs continuous.   . Polyethylene Glycol 3350 (MIRALAX PO) Take 17 g by mouth as needed.  . potassium chloride SA (K-DUR) 10 MEQ tablet Take 2 tablets (20 mEq total) by mouth daily. For low potassium.  [name] spironolactone (ALDACTONE) 25 MG tablet TAKE 1 TABLET BY MOUTH EVERY DAY  . Treprostinil (TYVASO) 0.6 MG/ML SOLN Inhale 18 mcg into the lungs 4 (four) times daily.   Past Medical History:   Diagnosis Date  . Altered mental status   . Anxiety and depression   . Chronic respiratory failure with hypoxia ([place]) 10/02/2017   Assoc with ? Cor pulmonale dx 09/2017 so rx 02 1-2 lpm 24/7  - 10/17/2017  Walked RA x 3 laps @ 185 ft each stopped due to  End of study, nl pace,  desat to 87 on 3rd lap - 12/19/2017  Saturations on Room Air at Rest =91 % and while Ambulating = 87%  But  on  2 Liters of pulsed oxygen while Ambulating =93% so rec POC 2lpm walking / use at rest if sats under 90%      . Colon cancer ([place]) 1988   Resected  . COPD (chronic obstructive pulmonary disease) ([place])   . Diabetes mellitus without complication ([name])    diet controlled- no meds. per pt  . Diarrhea 04/02/2018  . Diverticulosis   . Hyperlipidemia   . Hypertension   . Insomnia   . Primary hyperparathyroidism ([place])   . SBO (small bowel obstruction) ([place])   . Tubular adenoma of rectum 02/23/2013   low grade  . Vitamin D deficiency    Past Surgical History:  Procedure Laterality Date  . ABDOMINAL HYSTERECTOMY  08/2015  . BREAST BIOPSY Left   . BREAST EXCISIONAL BIOPSY Right   . COLON RESECTION  1980's  . COLONOSCOPY    .  ENDOMETRIAL BIOPSY  2009   negative  . INCONTINENCE SURGERY  2017  . RIGHT HEART CATH N/A 09/20/2017   Procedure: RIGHT HEART CATH;  Surgeon: Larey Dresser, MD;  Location: [facility];  Service: Cardiovascular;  Laterality: N/A;  . RIGHT/LEFT HEART CATH AND CORONARY ANGIOGRAPHY N/A 03/19/2018   Procedure: RIGHT/LEFT HEART CATH AND CORONARY ANGIOGRAPHY;  Surgeon: Larey Dresser, MD;  Location: [facility];  Service: Cardiovascular;  Laterality: N/A;   Social History   Social History Narrative   Married. 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   Review of Systems   Objective:   Physical Exam BP 122/84 (BP Location: Right  Arm, Patient Position: Sitting, Cuff Size: Normal)   Pulse 73   Temp 97.6 F (36.4 C) (Oral)   Ht 5' 6" (1.676 m)   Wt 107 lb (48.5 kg)   SpO2 90% Comment: 3lpm  BMI 17.27 kg/m  Chronically ill In wheelchair

## 2018-08-20 NOTE — Patient Instructions (Signed)
  Continue the miralax as needed and come back and see Dr Carlean Purl as needed.    I appreciate the opportunity to care for you. Silvano Rusk, MD, [facility]

## 2018-08-20 NOTE — Assessment & Plan Note (Signed)
Doing well now Continue prn MiraLax

## 2018-08-20 NOTE — Progress Notes (Signed)
Denise [name] 73 y.o. 1945-11-10 [id number]  Assessment & Plan:   Constipation Doing well now Continue prn MiraLax     Subjective:   Chief Complaint: Follow-up of constipation, improved  HPI The patient is a 73 year old African-American woman here with her husband for follow-up of constipation, seen July 12, 2018, and I prescribed her had her take a MiraLAX purge.  That was successful and she is moving her bowels 2 or 3 times a day without difficulty.  Sometimes she will skip a day or 2 and use the MiraLAX with success.  She is satisfied with her quality of life regarding her constipation.  She continues to complain of severe dyspnea.  She is on treprostinil inhaler and that seems to be helping and is due for an increased dose titration soon. No Known Allergies Current Meds  Medication Sig  . albuterol (VENTOLIN HFA) 108 (90 Base) MCG/ACT inhaler INHALE 1-2 PUFFS INTO THE LUNGS EVERY 4 HOURS AS NEEDED FOR WHEEZING OR SHORTNESS OF BREATH.  [name] aspirin EC 81 MG EC tablet Take 1 tablet (81 mg total) by mouth daily.  [name] atorvastatin (LIPITOR) 10 MG tablet TAKE 1 TABLET BY MOUTH EVERYDAY AT BEDTIME  . BIDIL 20-37.5 MG tablet TAKE 1 TABLET BY MOUTH THREE TIMES A DAY  . bisoprolol (ZEBETA) 5 MG tablet Take 0.5 tablets (2.5 mg total) by mouth daily.  . Blood Glucose Calibration (ACCU-CHEK AVIVA) SOLN USE AS INSTRUCTED TO TEST BLOOD SUGAR DAILY  . Blood Glucose Monitoring Suppl (ACCU-CHEK AVIVA PLUS) w/Device KIT Use as instructed to test blood sugar up to 3 times daily  . cinacalcet (SENSIPAR) 30 MG tablet Take 1 tablet (30 mg total) by mouth daily.  . famotidine (PEPCID) 40 MG tablet TAKE 1 TABLET BY MOUTH TWICE A DAY  . Fluticasone-Salmeterol (WIXELA INHUB) 250-50 MCG/DOSE AEPB One puff twice daily  . glucose blood (ACCU-CHEK AVIVA PLUS) test strip Use as instructed to test blood sugar up to 3 times daily  . Lancets (ACCU-CHEK SOFT TOUCH) lancets Use as instructed to test blood  sugar up to 3 times daily  . losartan (COZAAR) 100 MG tablet TAKE 1 TABLET BY MOUTH ONCE DAILY FOR BLOOD PRESSURE  . mirtazapine (REMERON) 15 MG tablet TAKE 1 TABLET BY MOUTH AT BEDTIME. FOR DEPRESSION AND APPETITE.  [name] OXYGEN Inhale 2 L into the lungs continuous.   . Polyethylene Glycol 3350 (MIRALAX PO) Take 17 g by mouth as needed.  . potassium chloride SA (K-DUR) 10 MEQ tablet Take 2 tablets (20 mEq total) by mouth daily. For low potassium.  [name] spironolactone (ALDACTONE) 25 MG tablet TAKE 1 TABLET BY MOUTH EVERY DAY  . Treprostinil (TYVASO) 0.6 MG/ML SOLN Inhale 18 mcg into the lungs 4 (four) times daily.   Past Medical History:  Diagnosis Date  . Altered mental status   . Anxiety and depression   . Chronic respiratory failure with hypoxia ([place]) 10/02/2017   Assoc with ? Cor pulmonale dx 09/2017 so rx 02 1-2 lpm 24/7  - 10/17/2017  Walked RA x 3 laps @ 185 ft each stopped due to  End of study, nl pace,  desat to 87 on 3rd lap - 12/19/2017  Saturations on Room Air at Rest =91 % and while Ambulating = 87%  But  on  2 Liters of pulsed oxygen while Ambulating =93% so rec POC 2lpm walking / use at rest if sats under 90%      . Colon cancer ([place]) 1988   Resected  .  COPD (chronic obstructive pulmonary disease) ([place])   . Diabetes mellitus without complication ([name])    diet controlled- no meds. per pt  . Diarrhea 04/02/2018  . Diverticulosis   . Hyperlipidemia   . Hypertension   . Insomnia   . Primary hyperparathyroidism ([place])   . SBO (small bowel obstruction) ([place])   . Tubular adenoma of rectum 02/23/2013   low grade  . Vitamin D deficiency    Past Surgical History:  Procedure Laterality Date  . ABDOMINAL HYSTERECTOMY  08/2015  . BREAST BIOPSY Left   . BREAST EXCISIONAL BIOPSY Right   . COLON RESECTION  1980's  . COLONOSCOPY    . ENDOMETRIAL BIOPSY  2009   negative  . INCONTINENCE SURGERY  2017  . RIGHT HEART CATH N/A 09/20/2017   Procedure: RIGHT HEART CATH;  Surgeon: Larey Dresser, MD;  Location: [facility];  Service: Cardiovascular;  Laterality: N/A;  . RIGHT/LEFT HEART CATH AND CORONARY ANGIOGRAPHY N/A 03/19/2018   Procedure: RIGHT/LEFT HEART CATH AND CORONARY ANGIOGRAPHY;  Surgeon: Larey Dresser, MD;  Location: [facility];  Service: Cardiovascular;  Laterality: N/A;   Social History   Social History Narrative   Married. 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   Review of Systems As above  Objective:   Physical Exam BP 122/84 (BP Location: Right Arm, Patient Position: Sitting, Cuff Size: Normal)   Pulse 73   Temp 97.6 F (36.4 C) (Oral)   Ht _0  (1.676 m)   Wt 107 lb (48.5 kg)   SpO2 90% Comment: 3lpm  BMI 17.27 kg/m  NAD Chronically ill in wheelchair on O2

## 2018-08-21 ENCOUNTER — Other Ambulatory Visit (HOSPITAL_COMMUNITY): Payer: Self-pay

## 2018-08-21 NOTE — Progress Notes (Signed)
Paramedicine Encounter    Patient ID: Denise [name], female    DOB: [birth year], 73 y.o.   MRN: [id number]    Patient Care Team: [name], NP as PCP - General (Internal Medicine) [name], MD as Consulting Physician (Cardiology)  Patient Active Problem List   Diagnosis Date Noted  . On home oxygen therapy 07/12/2018  . CHF (congestive heart failure) ([name]) 05/16/2018  . Chronic respiratory failure with hypoxia and hypercapnia ([name]) 05/03/2018  . Borderline abnormal TFTs 05/02/2018  . Type 2 diabetes mellitus ([name]) 04/26/2018  . Unintended weight loss 04/26/2018  . Lower extremity edema 02/20/2018  . Chronic respiratory failure with hypoxia ([name]) 10/02/2017  . Hematuria 09/27/2017  . Pulmonary hypertension ([name]) c/w cor pulmonale   . Protein-calorie malnutrition, severe 09/17/2017  . Constipation   . Hyperparathyroidism, primary ([name]) 07/27/2016  . Loss of appetite 06/02/2016  . COPD GOLD  II   11/22/2015  . Vitamin D deficiency 11/22/2015  . Anxiety and depression 11/22/2015  . Insomnia 11/22/2015  . Hyperlipidemia 11/22/2015  . Essential hypertension 11/22/2015  . Hx of colonic polyps 02/10/2013  . History of colon cancer 06/11/1986    Current Outpatient Medications:  .  aspirin EC 81 MG EC tablet, Take 1 tablet (81 mg total) by mouth daily., Disp: 30 tablet, Rfl: 0 .  atorvastatin (LIPITOR) 10 MG tablet, TAKE 1 TABLET BY MOUTH EVERYDAY AT BEDTIME, Disp: 90 tablet, Rfl: 3 .  BIDIL 20-37.5 MG tablet, TAKE 1 TABLET BY MOUTH THREE TIMES A DAY, Disp: 270 tablet, Rfl: 0 .  cinacalcet (SENSIPAR) 30 MG tablet, Take 1 tablet (30 mg total) by mouth daily., Disp: 30 tablet, Rfl: 11 .  famotidine (PEPCID) 40 MG tablet, TAKE 1 TABLET BY MOUTH TWICE A DAY, Disp: 180 tablet, Rfl: 0 .  losartan (COZAAR) 100 MG tablet, TAKE 1 TABLET BY MOUTH ONCE DAILY FOR BLOOD PRESSURE, Disp: 90 tablet, Rfl: 1 .  mirtazapine (REMERON) 15 MG tablet, TAKE 1 TABLET BY MOUTH AT  BEDTIME. FOR DEPRESSION AND APPETITE., Disp: 90 tablet, Rfl: 0 .  potassium chloride SA (K-DUR) 10 MEQ tablet, Take 2 tablets (20 mEq total) by mouth daily. For low potassium., Disp: 60 tablet, Rfl: 3 .  spironolactone (ALDACTONE) 25 MG tablet, TAKE 1 TABLET BY MOUTH EVERY DAY, Disp: 30 tablet, Rfl: 6 .  albuterol (VENTOLIN HFA) 108 (90 Base) MCG/ACT inhaler, INHALE 1-2 PUFFS INTO THE LUNGS EVERY 4 HOURS AS NEEDED FOR WHEEZING OR SHORTNESS OF BREATH., Disp: 6.7 g, Rfl: 5 .  bisoprolol (ZEBETA) 5 MG tablet, Take 0.5 tablets (2.5 mg total) by mouth daily., Disp: 45 tablet, Rfl: 3 .  Blood Glucose Calibration (ACCU-CHEK AVIVA) SOLN, USE AS INSTRUCTED TO TEST BLOOD SUGAR DAILY, Disp: 1 each, Rfl: 2 .  Blood Glucose Monitoring Suppl (ACCU-CHEK AVIVA PLUS) w/Device KIT, Use as instructed to test blood sugar up to 3 times daily, Disp: 1 kit, Rfl: 0 .  Fluticasone-Salmeterol (WIXELA INHUB) 250-50 MCG/DOSE AEPB, One puff twice daily, Disp: , Rfl:  .  glucose blood (ACCU-CHEK AVIVA PLUS) test strip, Use as instructed to test blood sugar up to 3 times daily, Disp: 300 each, Rfl: 1 .  Lancets (ACCU-CHEK SOFT TOUCH) lancets, Use as instructed to test blood sugar up to 3 times daily, Disp: 300 each, Rfl: 1 .  OXYGEN, Inhale 2 L into the lungs continuous. , Disp: , Rfl:  .  Polyethylene Glycol 3350 (MIRALAX PO), Take 17 g by mouth as needed., Disp: ,  Rfl:  .  Treprostinil (TYVASO) 0.6 MG/ML SOLN, Inhale 18 mcg into the lungs 4 (four) times daily., Disp: , Rfl:  No Known Allergies   Social History   Socioeconomic History  . Marital status: Married    Spouse name: Not on file  . Number of children: 3  . Years of education: Not on file  . Highest education level: Not on file  Occupational History  . Not on file  Social Needs  . Financial resource strain: Not on file  . Food insecurity    Worry: Not on file    Inability: Not on file  . Transportation needs    Medical: Not on file    Non-medical: Not on  file  Tobacco Use  . Smoking status: Former Smoker    Packs/day: 1.00    Years: 30.00    Pack years: 30.00    Types: Cigarettes    Quit date: 1989    Years since quitting: 31.6  . Smokeless tobacco: Never Used  Substance and Sexual Activity  . Alcohol use: No  . Drug use: No  . Sexual activity: Yes    Partners: Male    Birth control/protection: Post-menopausal, Surgical  Lifestyle  . Physical activity    Days per week: Not on file    Minutes per session: Not on file  . Stress: Not on file  Relationships  . Social Herbalist on phone: Not on file    Gets together: Not on file    Attends religious service: Not on file    Active member of club or organization: Not on file    Attends meetings of clubs or organizations: Not on file    Relationship status: Not on file  . Intimate partner violence    Fear of current or ex partner: Not on file    Emotionally abused: Not on file    Physically abused: Not on file    Forced sexual activity: Not on file  Other Topics Concern  . Not on file  Social History Narrative   Married. 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  Musculoskeletal:        General: Swelling present.     Comments: Both ankles swollen  Skin:    General: Skin is warm and dry.         No future appointments.   BP 110/80 (BP Location: Left Arm, Patient Position: Sitting, Cuff Size: Normal)   Pulse 80   Temp 98.9 F (37.2 C)   Wt 109 lb (49.4 kg)   SpO2 90%   BMI 17.59 kg/m   Weight yesterday-109 Last visit weight-105  ATF pt CAO x4 sitting at the kitchen table. She's missed all of the evening medications for the entire week, which includes spirolactone, bidil, and atorvastatin.  She said that she falls asleep before she has the chance to take her meds.  She denies sob (outside of her  norm), chest pain and dizziness. Edema noted to both ankles.  I moved the spirolactone and the other pills that she's forgetting to the afternoon portion of the pill box.  Her husband said that he will help her with remember to take her meds.  I will follow up with her Friday to see if  the edema decreased after taking all of her meds. rx bottles verified and pill box refilled.    Medication ordered: None  Shakura Cowing, EMT Paramedic [phone] 08/21/2018    ACTION: Home visit completed

## 2018-08-23 ENCOUNTER — Other Ambulatory Visit: Payer: Self-pay | Admitting: Cardiovascular Disease

## 2018-08-28 ENCOUNTER — Other Ambulatory Visit (HOSPITAL_COMMUNITY): Payer: Self-pay

## 2018-08-28 ENCOUNTER — Telehealth (HOSPITAL_COMMUNITY): Payer: Self-pay

## 2018-08-28 DIAGNOSIS — I10 Essential (primary) hypertension: Secondary | ICD-10-CM

## 2018-08-28 NOTE — Telephone Encounter (Signed)
Dee of Paramedicine called stating patient has been feeling tired and lousy after taking her morning medications. She also notes patient have been missing night doses of medications.  Karena Addison will move night doses to evenings to help her remember to take medications. Per Dr Aundra Dubin, pt can move Arlyce Harman to evenings and decrease dose of Losartan to 63m.  Verbalized understanding. Blood pressure normal at visit.

## 2018-08-28 NOTE — Progress Notes (Signed)
Paramedicine Encounter    Patient ID: Denise [name], female    DOB: 01/02/1945, 73 y.o.   MRN: [id number]    Patient Care Team: [name], Denise K, NP as PCP - General (Internal Medicine) [name], Denise C, MD as Consulting Physician (Cardiology)  Patient Active Problem List   Diagnosis Date Noted  . On home oxygen therapy 07/12/2018  . CHF (congestive heart failure) ([name]) 05/16/2018  . Chronic respiratory failure with hypoxia and hypercapnia ([name]) 05/03/2018  . Borderline abnormal TFTs 05/02/2018  . Type 2 diabetes mellitus ([name]) 04/26/2018  . Unintended weight loss 04/26/2018  . Lower extremity edema 02/20/2018  . Chronic respiratory failure with hypoxia ([name]) 10/02/2017  . Hematuria 09/27/2017  . Pulmonary hypertension ([name]) c/w cor pulmonale   . Protein-calorie malnutrition, severe 09/17/2017  . Constipation   . Hyperparathyroidism, primary ([name]) 07/27/2016  . Loss of appetite 06/02/2016  . COPD GOLD  II   11/22/2015  . Vitamin D deficiency 11/22/2015  . Anxiety and depression 11/22/2015  . Insomnia 11/22/2015  . Hyperlipidemia 11/22/2015  . Essential hypertension 11/22/2015  . Hx of colonic polyps 02/10/2013  . History of colon cancer 06/11/1986    Current Outpatient Medications:  .  aspirin EC 81 MG EC tablet, Take 1 tablet (81 mg total) by mouth daily., Disp: 30 tablet, Rfl: 0 .  atorvastatin (LIPITOR) 10 MG tablet, TAKE 1 TABLET BY MOUTH EVERYDAY AT BEDTIME, Disp: 90 tablet, Rfl: 3 .  BIDIL 20-37.5 MG tablet, TAKE 1 TABLET BY MOUTH THREE TIMES A DAY, Disp: 270 tablet, Rfl: 0 .  bisoprolol (ZEBETA) 5 MG tablet, Take 0.5 tablets (2.5 mg total) by mouth daily., Disp: 45 tablet, Rfl: 3 .  cinacalcet (SENSIPAR) 30 MG tablet, Take 1 tablet (30 mg total) by mouth daily., Disp: 30 tablet, Rfl: 11 .  famotidine (PEPCID) 40 MG tablet, TAKE 1 TABLET BY MOUTH TWICE A DAY, Disp: 180 tablet, Rfl: 0 .  losartan (COZAAR) 100 MG tablet, TAKE 1 TABLET BY MOUTH ONCE DAILY FOR  BLOOD PRESSURE, Disp: 90 tablet, Rfl: 1 .  mirtazapine (REMERON) 15 MG tablet, TAKE 1 TABLET BY MOUTH AT BEDTIME. FOR DEPRESSION AND APPETITE., Disp: 90 tablet, Rfl: 0 .  OXYGEN, Inhale 2 L into the lungs continuous. , Disp: , Rfl:  .  potassium chloride SA (K-DUR) 10 MEQ tablet, Take 2 tablets (20 mEq total) by mouth daily. For low potassium., Disp: 60 tablet, Rfl: 3 .  spironolactone (ALDACTONE) 25 MG tablet, TAKE 1 TABLET BY MOUTH EVERY DAY, Disp: 30 tablet, Rfl: 6 .  albuterol (VENTOLIN HFA) 108 (90 Base) MCG/ACT inhaler, INHALE 1-2 PUFFS INTO THE LUNGS EVERY 4 HOURS AS NEEDED FOR WHEEZING OR SHORTNESS OF BREATH., Disp: 6.7 g, Rfl: 5 .  Blood Glucose Calibration (ACCU-CHEK AVIVA) SOLN, USE AS INSTRUCTED TO TEST BLOOD SUGAR DAILY, Disp: 1 each, Rfl: 2 .  Blood Glucose Monitoring Suppl (ACCU-CHEK AVIVA PLUS) w/Device KIT, Use as instructed to test blood sugar up to 3 times daily, Disp: 1 kit, Rfl: 0 .  Fluticasone-Salmeterol (WIXELA INHUB) 250-50 MCG/DOSE AEPB, One puff twice daily, Disp: , Rfl:  .  glucose blood (ACCU-CHEK AVIVA PLUS) test strip, Use as instructed to test blood sugar up to 3 times daily, Disp: 300 each, Rfl: 1 .  Lancets (ACCU-CHEK SOFT TOUCH) lancets, Use as instructed to test blood sugar up to 3 times daily, Disp: 300 each, Rfl: 1 .  Polyethylene Glycol 3350 (MIRALAX PO), Take 17 g by mouth as needed., Disp: ,   Rfl:  .  Treprostinil (TYVASO) 0.6 MG/ML SOLN, Inhale 18 mcg into the lungs 4 (four) times daily., Disp: , Rfl:  No Known Allergies   Social History   Socioeconomic History  . Marital status: Married    Spouse name: Not on file  . Number of children: 3  . Years of education: Not on file  . Highest education level: Not on file  Occupational History  . Not on file  Social Needs  . Financial resource strain: Not on file  . Food insecurity    Worry: Not on file    Inability: Not on file  . Transportation needs    Medical: Not on file    Non-medical: Not on  file  Tobacco Use  . Smoking status: Former Smoker    Packs/day: 1.00    Years: 30.00    Pack years: 30.00    Types: Cigarettes    Quit date: 1989    Years since quitting: 31.6  . Smokeless tobacco: Never Used  Substance and Sexual Activity  . Alcohol use: No  . Drug use: No  . Sexual activity: Yes    Partners: Male    Birth control/protection: Post-menopausal, Surgical  Lifestyle  . Physical activity    Days per week: Not on file    Minutes per session: Not on file  . Stress: Not on file  Relationships  . Social Herbalist on phone: Not on file    Gets together: Not on file    Attends religious service: Not on file    Active member of club or organization: Not on file    Attends meetings of clubs or organizations: Not on file    Relationship status: Not on file  . Intimate partner violence    Fear of current or ex partner: Not on file    Emotionally abused: Not on file    Physically abused: Not on file    Forced sexual activity: Not on file  Other Topics Concern  . Not on file  Social History Narrative   Married. 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     Breath sounds: No wheezing or rales.  Musculoskeletal:        General: No swelling.     Right lower leg: No edema.     Left lower leg: No edema.  Skin:    General: Skin is warm and dry.         No future appointments.   BP 118/66 (BP Location: Left Arm, Patient Position: Sitting, Cuff Size: Normal)   Pulse 83   Temp 99.4 F (37.4 C)   Resp 15   SpO2 94%   Weight yesterday-didn't weigh Last visit weight-109  ATF pt CAO x4 c/o "feeling sick and drowsy".  She said that she's been feeling this way for a week.  Mrs. Denise [name] said that after about 20 mins of taking her morning meds, she becomes nauseated which last for about 45 mis. She  denies increase in sob, no chest pain and dizziness.  She also denies blurred vision and unsteadiness.  Her vitals are within normal limits.  She took most of her medications this week, I had to rearrange spirolactone from bed to evening.  Mrs. Denise [name] was falling asleep before  she took her bedtime meds.  rx bottles verified and pill box refilled.   I spoke with [facility] at [facility] about [name] complaints. She spoke with Dr. McLean and he said to decrease losartan to 50mg (.5 tab).   Medication ordered: None   , EMT Paramedic [phone] 08/28/2018    ACTION: Home visit completed      

## 2018-08-29 DIAGNOSIS — J9611 Chronic respiratory failure with hypoxia: Secondary | ICD-10-CM | POA: Diagnosis not present

## 2018-08-29 MED ORDER — SPIRONOLACTONE 25 MG PO TABS: 25.0000 mg | ORAL_TABLET | Freq: Every day | ORAL | 6 refills | Status: DC

## 2018-08-29 MED ORDER — LOSARTAN POTASSIUM 50 MG PO TABS: 50.0000 mg | ORAL_TABLET | Freq: Every day | ORAL | 1 refills | Status: DC

## 2018-09-02 ENCOUNTER — Telehealth: Payer: Self-pay | Admitting: Primary Care

## 2018-09-02 DIAGNOSIS — J9611 Chronic respiratory failure with hypoxia: Secondary | ICD-10-CM | POA: Diagnosis not present

## 2018-09-02 NOTE — Telephone Encounter (Signed)
Spoken and notified patient of Kate Clark's comments. Patient verbalized understanding.  

## 2018-09-02 NOTE — Telephone Encounter (Signed)
Please notify patient:  She is taking mirtazapine for depression and appetite, this is something we started in 2018. This medication causes drowsiness and should be taken at bedtime. Have her try that and update. I see where cardiology has also provided some input.

## 2018-09-02 NOTE — Telephone Encounter (Signed)
Patient said her medications make her feel unsteady.Patient takes all of her medications at one time and she's not sure which medication is making her unsteady.  Dr.McLane lowered her blood pressure medication to one pill a day, but she still feels nausea and she takes a nap after she takes the pill.  There's a nurse that comes by her house on Wednesdays and they were going to discuss it with the nurse on Wednesday.Patient uses [facility]

## 2018-09-04 ENCOUNTER — Other Ambulatory Visit (HOSPITAL_COMMUNITY): Payer: Self-pay

## 2018-09-04 NOTE — Progress Notes (Signed)
Paramedicine Encounter    Patient ID: Denise [name], female    DOB: [birth year], 73 y.o.   MRN: [id number]    Patient Care Team: [name], NP as PCP - General (Internal Medicine) [name], MD as Consulting Physician (Cardiology)  Patient Active Problem List   Diagnosis Date Noted  . On home oxygen therapy 07/12/2018  . CHF (congestive heart failure) ([place]) 05/16/2018  . Chronic respiratory failure with hypoxia and hypercapnia ([place]) 05/03/2018  . Borderline abnormal TFTs 05/02/2018  . Type 2 diabetes mellitus ([place]) 04/26/2018  . Unintended weight loss 04/26/2018  . Lower extremity edema 02/20/2018  . Chronic respiratory failure with hypoxia ([place]) 10/02/2017  . Hematuria 09/27/2017  . Pulmonary hypertension ([name]) c/w cor pulmonale   . Protein-calorie malnutrition, severe 09/17/2017  . Constipation   . Hyperparathyroidism, primary ([place]) 07/27/2016  . Loss of appetite 06/02/2016  . COPD GOLD  II   11/22/2015  . Vitamin D deficiency 11/22/2015  . Anxiety and depression 11/22/2015  . Insomnia 11/22/2015  . Hyperlipidemia 11/22/2015  . Essential hypertension 11/22/2015  . Hx of colonic polyps 02/10/2013  . History of colon cancer 06/11/1986    Current Outpatient Medications:  .  aspirin EC 81 MG EC tablet, Take 1 tablet (81 mg total) by mouth daily., Disp: 30 tablet, Rfl: 0 .  atorvastatin (LIPITOR) 10 MG tablet, TAKE 1 TABLET BY MOUTH EVERYDAY AT BEDTIME, Disp: 90 tablet, Rfl: 3 .  BIDIL 20-37.5 MG tablet, TAKE 1 TABLET BY MOUTH THREE TIMES A DAY, Disp: 270 tablet, Rfl: 0 .  cinacalcet (SENSIPAR) 30 MG tablet, Take 1 tablet (30 mg total) by mouth daily., Disp: 30 tablet, Rfl: 11 .  famotidine (PEPCID) 40 MG tablet, TAKE 1 TABLET BY MOUTH TWICE A DAY, Disp: 180 tablet, Rfl: 0 .  losartan (COZAAR) 50 MG tablet, Take 1 tablet (50 mg total) by mouth daily., Disp: 90 tablet, Rfl: 1 .  mirtazapine (REMERON) 15 MG tablet, TAKE 1 TABLET BY MOUTH AT BEDTIME. FOR  DEPRESSION AND APPETITE., Disp: 90 tablet, Rfl: 0 .  OXYGEN, Inhale 2 L into the lungs continuous. , Disp: , Rfl:  .  potassium chloride SA (K-DUR) 10 MEQ tablet, Take 2 tablets (20 mEq total) by mouth daily. For low potassium., Disp: 60 tablet, Rfl: 3 .  spironolactone (ALDACTONE) 25 MG tablet, Take 1 tablet (25 mg total) by mouth daily. Take at night, Disp: 30 tablet, Rfl: 6 .  Treprostinil (TYVASO) 0.6 MG/ML SOLN, Inhale 18 mcg into the lungs 4 (four) times daily., Disp: , Rfl:  .  albuterol (VENTOLIN HFA) 108 (90 Base) MCG/ACT inhaler, INHALE 1-2 PUFFS INTO THE LUNGS EVERY 4 HOURS AS NEEDED FOR WHEEZING OR SHORTNESS OF BREATH., Disp: 6.7 g, Rfl: 5 .  bisoprolol (ZEBETA) 5 MG tablet, Take 0.5 tablets (2.5 mg total) by mouth daily., Disp: 45 tablet, Rfl: 3 .  Blood Glucose Calibration (ACCU-CHEK AVIVA) SOLN, USE AS INSTRUCTED TO TEST BLOOD SUGAR DAILY, Disp: 1 each, Rfl: 2 .  Blood Glucose Monitoring Suppl (ACCU-CHEK AVIVA PLUS) w/Device KIT, Use as instructed to test blood sugar up to 3 times daily, Disp: 1 kit, Rfl: 0 .  Fluticasone-Salmeterol (WIXELA INHUB) 250-50 MCG/DOSE AEPB, One puff twice daily, Disp: , Rfl:  .  glucose blood (ACCU-CHEK AVIVA PLUS) test strip, Use as instructed to test blood sugar up to 3 times daily, Disp: 300 each, Rfl: 1 .  Lancets (ACCU-CHEK SOFT TOUCH) lancets, Use as instructed to test blood sugar up  to 3 times daily, Disp: 300 each, Rfl: 1 .  Polyethylene Glycol 3350 (MIRALAX PO), Take 17 g by mouth as needed., Disp: , Rfl:  No Known Allergies   Social History   Socioeconomic History  . Marital status: Married    Spouse name: Not on file  . Number of children: 3  . Years of education: Not on file  . Highest education level: Not on file  Occupational History  . Not on file  Social Needs  . Financial resource strain: Not on file  . Food insecurity    Worry: Not on file    Inability: Not on file  . Transportation needs    Medical: Not on file     Non-medical: Not on file  Tobacco Use  . Smoking status: Former Smoker    Packs/day: 1.00    Years: 30.00    Pack years: 30.00    Types: Cigarettes    Quit date: 1989    Years since quitting: 31.6  . Smokeless tobacco: Never Used  Substance and Sexual Activity  . Alcohol use: No  . Drug use: No  . Sexual activity: Yes    Partners: Male    Birth control/protection: Post-menopausal, Surgical  Lifestyle  . Physical activity    Days per week: Not on file    Minutes per session: Not on file  . Stress: Not on file  Relationships  . Social Herbalist on phone: Not on file    Gets together: Not on file    Attends religious service: Not on file    Active member of club or organization: Not on file    Attends meetings of clubs or organizations: Not on file    Relationship status: Not on file  . Intimate partner violence    Fear of current or ex partner: Not on file    Emotionally abused: Not on file    Physically abused: Not on file    Forced sexual activity: Not on file  Other Topics Concern  . Not on file  Social History Narrative   Married. 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     Breath sounds: No wheezing or rales.  Musculoskeletal:        General: Swelling present.     Right lower leg: No edema.     Left lower leg: No edema.     Comments: Swelling noted to both ankles         No future appointments.   BP (!) 160/90 (BP Location: Left Arm, Patient Position: Sitting, Cuff Size: Normal)   Pulse 78   Wt 100 lb (45.4 kg)   SpO2 94%   BMI 16.14 kg/m   Weight yesterday-100 Last visit weight-109 (08/21/18)  ATF pt CAO sitting at the kitchen table with no complaints. She said that she woke up this morning and felt better than she has in over a week.  Dr. Aundra Dubin decreased losartan last week to 27m.   Her husband said that Dr. CCarlis Abbotthad switched one of her medications to night time.  After reviewing Dr. CAinsley Spinnernote, the medication mirtazapine has been given to her in the night time since I began filling her pill box.  Her BP is elevated today, however she didn't take  last night medications or today's.  She took them during our visit. I advised her of the importance of taking her medications as scheduled.  She still don't have much of an appetite.  Her weight has decreased to 100 lbs.  Mr. Stauch mentioned the aerosol mask for Tyvaso. I spoke with [place] at [facility] about their request.   After speaking with Dr. Carlis Abbott her husband would like all of her medications in the bedtime space instead of the evening.  I switched the two because Mrs. Gaal was not taking her bedtime medications which included bidil, spirolactone, and atorvastatin.  I asked them to set an alarm reminding her to take the bedtime meds. She said that she "wont forget to take them".  rx bottles verified and pill box refilled.    Medication ordered: Bisoprolol  Shadrack Brummitt, EMT Paramedic [phone] 09/04/2018    ACTION: Home visit completed

## 2018-09-12 ENCOUNTER — Telehealth (HOSPITAL_COMMUNITY): Payer: Self-pay

## 2018-09-12 ENCOUNTER — Other Ambulatory Visit (HOSPITAL_COMMUNITY): Payer: Self-pay

## 2018-09-12 NOTE — Progress Notes (Signed)
Paramedicine Encounter    Patient ID: Denise [name], female    DOB: 11/08/1945, 73 y.o.   MRN: [id number]    Patient Care Team: [name], NP as PCP - General (Internal Medicine) [name], MD as Consulting Physician (Cardiology)  Patient Active Problem List   Diagnosis Date Noted  . On home oxygen therapy 07/12/2018  . CHF (congestive heart failure) ([place]) 05/16/2018  . Chronic respiratory failure with hypoxia and hypercapnia ([place]) 05/03/2018  . Borderline abnormal TFTs 05/02/2018  . Type 2 diabetes mellitus ([place]) 04/26/2018  . Unintended weight loss 04/26/2018  . Lower extremity edema 02/20/2018  . Chronic respiratory failure with hypoxia ([place]) 10/02/2017  . Hematuria 09/27/2017  . Pulmonary hypertension ([name]) c/w cor pulmonale   . Protein-calorie malnutrition, severe 09/17/2017  . Constipation   . Hyperparathyroidism, primary ([place]) 07/27/2016  . Loss of appetite 06/02/2016  . COPD GOLD  II   11/22/2015  . Vitamin D deficiency 11/22/2015  . Anxiety and depression 11/22/2015  . Insomnia 11/22/2015  . Hyperlipidemia 11/22/2015  . Essential hypertension 11/22/2015  . Hx of colonic polyps 02/10/2013  . History of colon cancer 06/11/1986    Current Outpatient Medications:  .  aspirin EC 81 MG EC tablet, Take 1 tablet (81 mg total) by mouth daily., Disp: 30 tablet, Rfl: 0 .  atorvastatin (LIPITOR) 10 MG tablet, TAKE 1 TABLET BY MOUTH EVERYDAY AT BEDTIME, Disp: 90 tablet, Rfl: 3 .  BIDIL 20-37.5 MG tablet, TAKE 1 TABLET BY MOUTH THREE TIMES A DAY, Disp: 270 tablet, Rfl: 0 .  cinacalcet (SENSIPAR) 30 MG tablet, Take 1 tablet (30 mg total) by mouth daily., Disp: 30 tablet, Rfl: 11 .  famotidine (PEPCID) 40 MG tablet, TAKE 1 TABLET BY MOUTH TWICE A DAY, Disp: 180 tablet, Rfl: 0 .  losartan (COZAAR) 50 MG tablet, Take 1 tablet (50 mg total) by mouth daily., Disp: 90 tablet, Rfl: 1 .  mirtazapine (REMERON) 15 MG tablet, TAKE 1 TABLET BY MOUTH AT BEDTIME. FOR  DEPRESSION AND APPETITE., Disp: 90 tablet, Rfl: 0 .  OXYGEN, Inhale 2 L into the lungs continuous. , Disp: , Rfl:  .  Polyethylene Glycol 3350 (MIRALAX PO), Take 17 g by mouth as needed., Disp: , Rfl:  .  potassium chloride SA (K-DUR) 10 MEQ tablet, Take 2 tablets (20 mEq total) by mouth daily. For low potassium., Disp: 60 tablet, Rfl: 3 .  spironolactone (ALDACTONE) 25 MG tablet, Take 1 tablet (25 mg total) by mouth daily. Take at night, Disp: 30 tablet, Rfl: 6 .  albuterol (VENTOLIN HFA) 108 (90 Base) MCG/ACT inhaler, INHALE 1-2 PUFFS INTO THE LUNGS EVERY 4 HOURS AS NEEDED FOR WHEEZING OR SHORTNESS OF BREATH., Disp: 6.7 g, Rfl: 5 .  bisoprolol (ZEBETA) 5 MG tablet, Take 0.5 tablets (2.5 mg total) by mouth daily., Disp: 45 tablet, Rfl: 3 .  Blood Glucose Calibration (ACCU-CHEK AVIVA) SOLN, USE AS INSTRUCTED TO TEST BLOOD SUGAR DAILY, Disp: 1 each, Rfl: 2 .  Blood Glucose Monitoring Suppl (ACCU-CHEK AVIVA PLUS) w/Device KIT, Use as instructed to test blood sugar up to 3 times daily, Disp: 1 kit, Rfl: 0 .  Fluticasone-Salmeterol (WIXELA INHUB) 250-50 MCG/DOSE AEPB, One puff twice daily, Disp: , Rfl:  .  glucose blood (ACCU-CHEK AVIVA PLUS) test strip, Use as instructed to test blood sugar up to 3 times daily, Disp: 300 each, Rfl: 1 .  Lancets (ACCU-CHEK SOFT TOUCH) lancets, Use as instructed to test blood sugar up to 3 times  daily, Disp: 300 each, Rfl: 1 .  Treprostinil (TYVASO) 0.6 MG/ML SOLN, Inhale 18 mcg into the lungs 4 (four) times daily., Disp: , Rfl:  No Known Allergies   Social History   Socioeconomic History  . Marital status: Married    Spouse name: Not on file  . Number of children: 3  . Years of education: Not on file  . Highest education level: Not on file  Occupational History  . Not on file  Social Needs  . Financial resource strain: Not on file  . Food insecurity    Worry: Not on file    Inability: Not on file  . Transportation needs    Medical: Not on file     Non-medical: Not on file  Tobacco Use  . Smoking status: Former Smoker    Packs/day: 1.00    Years: 30.00    Pack years: 30.00    Types: Cigarettes    Quit date: 1989    Years since quitting: 31.7  . Smokeless tobacco: Never Used  Substance and Sexual Activity  . Alcohol use: No  . Drug use: No  . Sexual activity: Yes    Partners: Male    Birth control/protection: Post-menopausal, Surgical  Lifestyle  . Physical activity    Days per week: Not on file    Minutes per session: Not on file  . Stress: Not on file  Relationships  . Social Herbalist on phone: Not on file    Gets together: Not on file    Attends religious service: Not on file    Active member of club or organization: Not on file    Attends meetings of clubs or organizations: Not on file    Relationship status: Not on file  . Intimate partner violence    Fear of current or ex partner: Not on file    Emotionally abused: Not on file    Physically abused: Not on file    Forced sexual activity: Not on file  Other Topics Concern  . Not on file  Social History Narrative   Married. 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  Musculoskeletal:        General: No swelling.     Right lower leg: No edema.     Left lower leg: No edema.  Skin:    General: Skin is warm and dry.         No future appointments.   BP 104/70 (BP Location: Left Arm, Patient Position: Sitting, Cuff Size: Normal)   Pulse 68   Temp 98.3 F (36.8 C)   Wt 101 lb (45.8 kg)   SpO2 93%   BMI 16.30 kg/m   Weight yesterday-101 Last visit weight-100  ATF pt CAO x4 sitting at the kitchen table talking to her husband.  She said that she feels good except for the sob.  The sob that she's talking about his per her norm, no increase in effort or rate. She's taken all of her  medications this week and refilled her pill box herself.  She denies chest pain and dizziness. She's feels a lot better in the morning now that she's taking her medications at night instead of the morning. rx bottles and pill box verified.  Medication ordered: None  Derrin Currey, EMT Paramedic [phone] 09/12/2018    ACTION: Home visit completed

## 2018-09-12 NOTE — Telephone Encounter (Signed)
Mr. Betzler left a voicemail yesterday; I returned his call to schedule a time for me to come by today.

## 2018-09-16 DIAGNOSIS — I270 Primary pulmonary hypertension: Secondary | ICD-10-CM | POA: Diagnosis not present

## 2018-09-18 ENCOUNTER — Other Ambulatory Visit (HOSPITAL_COMMUNITY): Payer: Self-pay

## 2018-09-18 NOTE — Progress Notes (Signed)
Paramedicine Encounter    Patient ID: Denise [name], female    DOB: Jan 03, 1945, 73 y.o.   MRN: [id number]    Patient Care Team: [name], NP as PCP - General (Internal Medicine) [name], MD as Consulting Physician (Cardiology)  Patient Active Problem List   Diagnosis Date Noted  . On home oxygen therapy 07/12/2018  . CHF (congestive heart failure) ([place]) 05/16/2018  . Chronic respiratory failure with hypoxia and hypercapnia ([place]) 05/03/2018  . Borderline abnormal TFTs 05/02/2018  . Type 2 diabetes mellitus ([place]) 04/26/2018  . Unintended weight loss 04/26/2018  . Lower extremity edema 02/20/2018  . Chronic respiratory failure with hypoxia ([place]) 10/02/2017  . Hematuria 09/27/2017  . Pulmonary hypertension ([name]) c/w cor pulmonale   . Protein-calorie malnutrition, severe 09/17/2017  . Constipation   . Hyperparathyroidism, primary ([place]) 07/27/2016  . Loss of appetite 06/02/2016  . COPD GOLD  II   11/22/2015  . Vitamin D deficiency 11/22/2015  . Anxiety and depression 11/22/2015  . Insomnia 11/22/2015  . Hyperlipidemia 11/22/2015  . Essential hypertension 11/22/2015  . Hx of colonic polyps 02/10/2013  . History of colon cancer 06/11/1986    Current Outpatient Medications:  .  aspirin EC 81 MG EC tablet, Take 1 tablet (81 mg total) by mouth daily., Disp: 30 tablet, Rfl: 0 .  atorvastatin (LIPITOR) 10 MG tablet, TAKE 1 TABLET BY MOUTH EVERYDAY AT BEDTIME, Disp: 90 tablet, Rfl: 3 .  BIDIL 20-37.5 MG tablet, TAKE 1 TABLET BY MOUTH THREE TIMES A DAY, Disp: 270 tablet, Rfl: 0 .  cinacalcet (SENSIPAR) 30 MG tablet, Take 1 tablet (30 mg total) by mouth daily., Disp: 30 tablet, Rfl: 11 .  famotidine (PEPCID) 40 MG tablet, TAKE 1 TABLET BY MOUTH TWICE A DAY, Disp: 180 tablet, Rfl: 0 .  losartan (COZAAR) 50 MG tablet, Take 1 tablet (50 mg total) by mouth daily., Disp: 90 tablet, Rfl: 1 .  mirtazapine (REMERON) 15 MG tablet, TAKE 1 TABLET BY MOUTH AT BEDTIME. FOR  DEPRESSION AND APPETITE., Disp: 90 tablet, Rfl: 0 .  OXYGEN, Inhale 2 L into the lungs continuous. , Disp: , Rfl:  .  potassium chloride SA (K-DUR) 10 MEQ tablet, Take 2 tablets (20 mEq total) by mouth daily. For low potassium., Disp: 60 tablet, Rfl: 3 .  spironolactone (ALDACTONE) 25 MG tablet, Take 1 tablet (25 mg total) by mouth daily. Take at night, Disp: 30 tablet, Rfl: 6 .  albuterol (VENTOLIN HFA) 108 (90 Base) MCG/ACT inhaler, INHALE 1-2 PUFFS INTO THE LUNGS EVERY 4 HOURS AS NEEDED FOR WHEEZING OR SHORTNESS OF BREATH., Disp: 6.7 g, Rfl: 5 .  bisoprolol (ZEBETA) 5 MG tablet, Take 0.5 tablets (2.5 mg total) by mouth daily., Disp: 45 tablet, Rfl: 3 .  Blood Glucose Calibration (ACCU-CHEK AVIVA) SOLN, USE AS INSTRUCTED TO TEST BLOOD SUGAR DAILY, Disp: 1 each, Rfl: 2 .  Blood Glucose Monitoring Suppl (ACCU-CHEK AVIVA PLUS) w/Device KIT, Use as instructed to test blood sugar up to 3 times daily, Disp: 1 kit, Rfl: 0 .  Fluticasone-Salmeterol (WIXELA INHUB) 250-50 MCG/DOSE AEPB, One puff twice daily, Disp: , Rfl:  .  glucose blood (ACCU-CHEK AVIVA PLUS) test strip, Use as instructed to test blood sugar up to 3 times daily, Disp: 300 each, Rfl: 1 .  Lancets (ACCU-CHEK SOFT TOUCH) lancets, Use as instructed to test blood sugar up to 3 times daily, Disp: 300 each, Rfl: 1 .  Polyethylene Glycol 3350 (MIRALAX PO), Take 17 g by mouth  as needed., Disp: , Rfl:  .  Treprostinil (TYVASO) 0.6 MG/ML SOLN, Inhale 18 mcg into the lungs 4 (four) times daily., Disp: , Rfl:  No Known Allergies   Social History   Socioeconomic History  . Marital status: Married    Spouse name: Not on file  . Number of children: 3  . Years of education: Not on file  . Highest education level: Not on file  Occupational History  . Not on file  Social Needs  . Financial resource strain: Not on file  . Food insecurity    Worry: Not on file    Inability: Not on file  . Transportation needs    Medical: Not on file     Non-medical: Not on file  Tobacco Use  . Smoking status: Former Smoker    Packs/day: 1.00    Years: 30.00    Pack years: 30.00    Types: Cigarettes    Quit date: 1989    Years since quitting: 31.7  . Smokeless tobacco: Never Used  Substance and Sexual Activity  . Alcohol use: No  . Drug use: No  . Sexual activity: Yes    Partners: Male    Birth control/protection: Post-menopausal, Surgical  Lifestyle  . Physical activity    Days per week: Not on file    Minutes per session: Not on file  . Stress: Not on file  Relationships  . Social Herbalist on phone: Not on file    Gets together: Not on file    Attends religious service: Not on file    Active member of club or organization: Not on file    Attends meetings of clubs or organizations: Not on file    Relationship status: Not on file  . Intimate partner violence    Fear of current or ex partner: Not on file    Emotionally abused: Not on file    Physically abused: Not on file    Forced sexual activity: Not on file  Other Topics Concern  . Not on file  Social History Narrative   Married. 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  Abdominal:     General: There is no distension.  Musculoskeletal:     Right lower leg: No edema.     Left lower leg: No edema.  Skin:    General: Skin is warm and dry.         No future appointments.   BP 102/74 (BP Location: Left Arm, Patient Position: Sitting, Cuff Size: Normal)   Pulse 92   Wt 102 lb (46.3 kg)   SpO2 92%   BMI 16.46 kg/m   Weight yesterday-102 Last visit weight-101  ATF pt CAO to her norm in the kitchen.  She said that she still has trouble breathing that increases when she walks.  She denies chest pain and dizziness.  Sob during exertion is normal for her.  She stated that she's taken her  medications for the week.  We talked about her trying to walk a little everyday, with her walker in the mornings.  Rx bottles verified and pill box refilled.     Medication ordered: none  Jenasia Dolinar, EMT Paramedic [phone] 09/18/2018    ACTION: Home visit completed

## 2018-09-20 ENCOUNTER — Other Ambulatory Visit (HOSPITAL_COMMUNITY): Payer: Self-pay | Admitting: Cardiology

## 2018-09-20 DIAGNOSIS — E876 Hypokalemia: Secondary | ICD-10-CM

## 2018-09-22 ENCOUNTER — Other Ambulatory Visit: Payer: Self-pay | Admitting: Primary Care

## 2018-09-23 ENCOUNTER — Telehealth (HOSPITAL_COMMUNITY): Payer: Self-pay | Admitting: Licensed Clinical Social Worker

## 2018-09-23 NOTE — Telephone Encounter (Signed)
CSW reached out to pt to check in regarding food and medication status at this time. Pt reports she is somewhat short of breath but this is not new- was hoping new medication would help with this but doesn't feel like it is.  Otherwise doing well and has everything she needs at this time.  CSW encouraged pt to reach out with any concerns and will continue to follow and assist as needed  Jorge Ny, [place] Worker [facility] [phone]

## 2018-09-25 ENCOUNTER — Other Ambulatory Visit (HOSPITAL_COMMUNITY): Payer: Self-pay

## 2018-09-25 ENCOUNTER — Ambulatory Visit (INDEPENDENT_AMBULATORY_CARE_PROVIDER_SITE_OTHER): Payer: Medicare HMO | Admitting: Primary Care

## 2018-09-25 ENCOUNTER — Other Ambulatory Visit: Payer: Self-pay

## 2018-09-25 ENCOUNTER — Telehealth (HOSPITAL_COMMUNITY): Payer: Self-pay

## 2018-09-25 ENCOUNTER — Encounter: Payer: Self-pay | Admitting: Primary Care

## 2018-09-25 VITALS — BP 100/64 | HR 89 | Temp 97.8°F | Ht 66.0 in | Wt 107.0 lb

## 2018-09-25 DIAGNOSIS — E43 Unspecified severe protein-calorie malnutrition: Secondary | ICD-10-CM | POA: Diagnosis not present

## 2018-09-25 DIAGNOSIS — J449 Chronic obstructive pulmonary disease, unspecified: Secondary | ICD-10-CM

## 2018-09-25 DIAGNOSIS — I509 Heart failure, unspecified: Secondary | ICD-10-CM | POA: Diagnosis not present

## 2018-09-25 DIAGNOSIS — F419 Anxiety disorder, unspecified: Secondary | ICD-10-CM | POA: Diagnosis not present

## 2018-09-25 DIAGNOSIS — J439 Emphysema, unspecified: Secondary | ICD-10-CM | POA: Diagnosis not present

## 2018-09-25 DIAGNOSIS — Z23 Encounter for immunization: Secondary | ICD-10-CM

## 2018-09-25 DIAGNOSIS — I270 Primary pulmonary hypertension: Secondary | ICD-10-CM | POA: Diagnosis not present

## 2018-09-25 DIAGNOSIS — J9611 Chronic respiratory failure with hypoxia: Secondary | ICD-10-CM | POA: Diagnosis not present

## 2018-09-25 DIAGNOSIS — F329 Major depressive disorder, single episode, unspecified: Secondary | ICD-10-CM | POA: Diagnosis not present

## 2018-09-25 DIAGNOSIS — I5042 Chronic combined systolic (congestive) and diastolic (congestive) heart failure: Secondary | ICD-10-CM

## 2018-09-25 MED ORDER — MIRTAZAPINE 15 MG PO TABS: ORAL_TABLET | ORAL | 3 refills | Status: AC

## 2018-09-25 NOTE — Assessment & Plan Note (Signed)
Appears euvolemic today. Weight is up from last visit, however, she does endorse a better appetite.  Discussed when to notify regarding weight gain. Follow up with cardiology PRN.

## 2018-09-25 NOTE — Assessment & Plan Note (Signed)
Compliant to continuous oxygen. Also apparently on Breo and not on Wilexa. Hardly using albuterol for which we discussed to use every 4-6 hours as needed for dyspnea.  I strongly advise she connect with either her pulmonologist or cardiologist regarding lack of improvement with Tyvaso,  She does appear stable today, lungs are remarkably clear.  No suspicion for pneumonia or COVID-19.  Prescription placed for wheelchair given progressive dyspnea.

## 2018-09-25 NOTE — Telephone Encounter (Signed)
I called pt to schedule a time for me to come by today.  We agreed to later today.

## 2018-09-25 NOTE — Progress Notes (Signed)
Paramedicine Encounter    Patient ID: Denise [name], female    DOB: [birth year], 73 y.o.   MRN: [id number]    Patient Care Team: [name], NP as PCP - General (Internal Medicine) [name], MD as Consulting Physician (Cardiology)  Patient Active Problem List   Diagnosis Date Noted  . On home oxygen therapy 07/12/2018  . CHF (congestive heart failure) ([place]) 05/16/2018  . Chronic respiratory failure with hypoxia and hypercapnia ([place]) 05/03/2018  . Borderline abnormal TFTs 05/02/2018  . Type 2 diabetes mellitus ([place]) 04/26/2018  . Unintended weight loss 04/26/2018  . Lower extremity edema 02/20/2018  . Chronic respiratory failure with hypoxia ([place]) 10/02/2017  . Hematuria 09/27/2017  . Pulmonary hypertension ([name]) c/w cor pulmonale   . Protein-calorie malnutrition, severe 09/17/2017  . Constipation   . Hyperparathyroidism, primary ([place]) 07/27/2016  . Loss of appetite 06/02/2016  . COPD GOLD  II   11/22/2015  . Vitamin D deficiency 11/22/2015  . Anxiety and depression 11/22/2015  . Insomnia 11/22/2015  . Hyperlipidemia 11/22/2015  . Essential hypertension 11/22/2015  . Hx of colonic polyps 02/10/2013  . History of colon cancer 06/11/1986    Current Outpatient Medications:  .  aspirin EC 81 MG EC tablet, Take 1 tablet (81 mg total) by mouth daily., Disp: 30 tablet, Rfl: 0 .  atorvastatin (LIPITOR) 10 MG tablet, TAKE 1 TABLET BY MOUTH EVERYDAY AT BEDTIME, Disp: 90 tablet, Rfl: 3 .  BIDIL 20-37.5 MG tablet, TAKE 1 TABLET BY MOUTH THREE TIMES A DAY, Disp: 270 tablet, Rfl: 0 .  cinacalcet (SENSIPAR) 30 MG tablet, Take 1 tablet (30 mg total) by mouth daily., Disp: 30 tablet, Rfl: 11 .  famotidine (PEPCID) 40 MG tablet, TAKE 1 TABLET BY MOUTH TWICE A DAY, Disp: 180 tablet, Rfl: 0 .  KLOR-CON M10 10 MEQ tablet, TAKE 2 TABLETS (20 MEQ TOTAL) BY MOUTH DAILY. FOR LOW POTASSIUM., Disp: 60 tablet, Rfl: 3 .  mirtazapine (REMERON) 15 MG tablet, TAKE 1 TABLET BY MOUTH AT  BEDTIME. FOR DEPRESSION AND APPETITE., Disp: 90 tablet, Rfl: 3 .  OXYGEN, Inhale 2 L into the lungs continuous. , Disp: , Rfl:  .  Polyethylene Glycol 3350 (MIRALAX PO), Take 17 g by mouth as needed., Disp: , Rfl:  .  Treprostinil (TYVASO) 0.6 MG/ML SOLN, Inhale 18 mcg into the lungs 4 (four) times daily., Disp: , Rfl:  .  albuterol (VENTOLIN HFA) 108 (90 Base) MCG/ACT inhaler, INHALE 1-2 PUFFS INTO THE LUNGS EVERY 4 HOURS AS NEEDED FOR WHEEZING OR SHORTNESS OF BREATH., Disp: 6.7 g, Rfl: 5 .  bisoprolol (ZEBETA) 5 MG tablet, Take 0.5 tablets (2.5 mg total) by mouth daily., Disp: 45 tablet, Rfl: 3 .  Blood Glucose Calibration (ACCU-CHEK AVIVA) SOLN, USE AS INSTRUCTED TO TEST BLOOD SUGAR DAILY, Disp: 1 each, Rfl: 2 .  Blood Glucose Monitoring Suppl (ACCU-CHEK AVIVA PLUS) w/Device KIT, Use as instructed to test blood sugar up to 3 times daily, Disp: 1 kit, Rfl: 0 .  Fluticasone-Salmeterol (WIXELA INHUB) 250-50 MCG/DOSE AEPB, One puff twice daily, Disp: , Rfl:  .  glucose blood (ACCU-CHEK AVIVA PLUS) test strip, Use as instructed to test blood sugar up to 3 times daily, Disp: 300 each, Rfl: 1 .  Lancets (ACCU-CHEK SOFT TOUCH) lancets, Use as instructed to test blood sugar up to 3 times daily, Disp: 300 each, Rfl: 1 .  losartan (COZAAR) 50 MG tablet, Take 1 tablet (50 mg total) by mouth daily., Disp: 90 tablet, Rfl:  1 .  spironolactone (ALDACTONE) 25 MG tablet, Take 1 tablet (25 mg total) by mouth daily. Take at night, Disp: 30 tablet, Rfl: 6 No Known Allergies   Social History   Socioeconomic History  . Marital status: Married    Spouse name: Not on file  . Number of children: 3  . Years of education: Not on file  . Highest education level: Not on file  Occupational History  . Not on file  Social Needs  . Financial resource strain: Not on file  . Food insecurity    Worry: Not on file    Inability: Not on file  . Transportation needs    Medical: Not on file    Non-medical: Not on file   Tobacco Use  . Smoking status: Former Smoker    Packs/day: 1.00    Years: 30.00    Pack years: 30.00    Types: Cigarettes    Quit date: 1989    Years since quitting: 31.7  . Smokeless tobacco: Never Used  Substance and Sexual Activity  . Alcohol use: No  . Drug use: No  . Sexual activity: Yes    Partners: Male    Birth control/protection: Post-menopausal, Surgical  Lifestyle  . Physical activity    Days per week: Not on file    Minutes per session: Not on file  . Stress: Not on file  Relationships  . Social Herbalist on phone: Not on file    Gets together: Not on file    Attends religious service: Not on file    Active member of club or organization: Not on file    Attends meetings of clubs or organizations: Not on file    Relationship status: Not on file  . Intimate partner violence    Fear of current or ex partner: Not on file    Emotionally abused: Not on file    Physically abused: Not on file    Forced sexual activity: Not on file  Other Topics Concern  . Not on file  Social History Narrative   Married. 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 No respiratory distress.     Breath sounds: No wheezing or rales.  Abdominal:     General: There is no distension.  Skin:    General: Skin is warm and dry.         Future Appointments  Date Time Provider [place]  12/23/2018 11:00 AM [name], NP [facility]     BP 130/80 (BP Location: Left Arm, Patient Position: Sitting, Cuff Size: Normal)   Pulse 85   Temp 99.3 F (37.4 C)   Wt 104 lb (47.2 kg)   SpO2 90%   BMI 16.79 kg/m   Weight yesterday-didn't weigh Last visit weight-102  ATF pt CAO x4 sitting in the kitchen.  She saw Dr. Carlis Abbott this morning; no medication changes during that visit.  Her husband said that the RN  from Tyvaso came by earlier today and witnessed how Denise [name] is using the inhaler.  He said that the RN will report the same to Dr. Aundra Dubin in a couple of weeks if nothing changes.  Denise [name] has taken all of her medications for the week.  She hasn't had an increase  in sob.  She denies chest pain and dizziness.  rx bottles verified and pill box refilled.   Medication ordered: Mirtazapine [id number] cinacalcet [id number]  [place], EMT Paramedic [phone] 09/25/2018    ACTION: Home visit completed

## 2018-09-25 NOTE — Assessment & Plan Note (Signed)
Doing well on Remeron.  Continue same.

## 2018-09-25 NOTE — Progress Notes (Addendum)
Subjective:    Patient ID: Denise [name], female    DOB: 03/15/1945, 73 y.o.   MRN: [id number]  HPI  Denise [name] is a 73 year old female with a history of exertional dyspnea, hypertension, CHF, hyperparathyroidism, pulmonary hypertension, COPD, colon cancer, type 2 diabetes, lower extremity edema who presents today to discuss medications and for progressive chronic dyspnea.  She is currently following with pulmonology and is managed on Advair inhaler and albuterol inhaler per her chart. Patient endorses that she is taking Breo. Also following with cardiology for CHF and is managed on spironolactone, Bildil, bisoprolol, oral potassium.   Today she endorses continued increase in dyspnea with walking very short distances. She cannot walk from her bedroom to the front door without having to rest. She endorses compliance to Breo twice daily, using albuterol once weekly on average, and is compliant to her Tyvaso and continuous oxygen. She doesn't feel as though the Tyvaso has helped as her effort of breathing has increased with minor movement.   She denies new cough, fevers, chills. She has been out of the house infrequently overall, just to go to the pharmacy and will stay in the car. She is visited by home health nursing on occasion, recently contacted by a Education officer, museum.   She and her family are requesting a portable wheelchair for movement around her home and in public (when feasible) due to severe dyspnea and CHF. She has a walker which is becoming ineffective. The patient has mobility limitations which cannot be resolved with a cane, crutch, or walker. Patient can safely self propel the wheelchair or has a caregiver who can assist at all times.   She is also requesting evaluation by a nutritionist to help with weight gain due to malnutrition.   Wt Readings from Last 3 Encounters:  09/25/18 107 lb (48.5 kg)  09/18/18 102 lb (46.3 kg)  09/12/18 101 lb (45.8 kg)     Review of  Systems  Constitutional: Negative for fever.  Respiratory: Positive for shortness of breath. Negative for cough.   Cardiovascular: Negative for chest pain and leg swelling.  Neurological: Negative for dizziness.       Past Medical History:  Diagnosis Date  . Altered mental status   . Anxiety and depression   . Chronic respiratory failure with hypoxia ([place]) 10/02/2017   Assoc with ? Cor pulmonale dx 09/2017 so rx 02 1-2 lpm 24/7  - 10/17/2017  Walked RA x 3 laps @ 185 ft each stopped due to  End of study, nl pace,  desat to 87 on 3rd lap - 12/19/2017  Saturations on Room Air at Rest =91 % and while Ambulating = 87%  But  on  2 Liters of pulsed oxygen while Ambulating =93% so rec POC 2lpm walking / use at rest if sats under 90%      . Colon cancer ([place]) 1988   Resected  . COPD (chronic obstructive pulmonary disease) ([place])   . Diabetes mellitus without complication ([name])    diet controlled- no meds. per pt  . Diarrhea 04/02/2018  . Diverticulosis   . Hyperlipidemia   . Hypertension   . Insomnia   . Primary hyperparathyroidism ([place])   . SBO (small bowel obstruction) ([place])   . Tubular adenoma of rectum 02/23/2013   low grade  . Vitamin D deficiency      Social History   Socioeconomic History  . Marital status: Married    Spouse name: Not on file  .  Number of children: 3  . Years of education: Not on file  . Highest education level: Not on file  Occupational History  . Not on file  Social Needs  . Financial resource strain: Not on file  . Food insecurity    Worry: Not on file    Inability: Not on file  . Transportation needs    Medical: Not on file    Non-medical: Not on file  Tobacco Use  . Smoking status: Former Smoker    Packs/day: 1.00    Years: 30.00    Pack years: 30.00    Types: Cigarettes    Quit date: 1989    Years since quitting: 31.7  . Smokeless tobacco: Never Used  Substance and Sexual Activity  . Alcohol use: No  . Drug use: No  . Sexual activity: Yes     Partners: Male    Birth control/protection: Post-menopausal, Surgical  Lifestyle  . Physical activity    Days per week: Not on file    Minutes per session: Not on file  . Stress: Not on file  Relationships  . Social Herbalist on phone: Not on file    Gets together: Not on file    Attends religious service: Not on file    Active member of club or organization: Not on file    Attends meetings of clubs or organizations: Not on file    Relationship status: Not on file  . Intimate partner violence    Fear of current or ex partner: Not on file    Emotionally abused: Not on file    Physically abused: Not on file    Forced sexual activity: Not on file  Other Topics Concern  . Not on file  Social History Narrative   Married. 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 ABDOMINAL HYSTERECTOMY  08/2015  . BREAST BIOPSY Left   . BREAST EXCISIONAL BIOPSY Right   . COLON RESECTION  1980's  . COLONOSCOPY    . ENDOMETRIAL BIOPSY  2009   negative  . INCONTINENCE SURGERY  2017  . RIGHT HEART CATH N/A 09/20/2017   Procedure: RIGHT HEART CATH;  Surgeon: Larey Dresser, MD;  Location: [facility];  Service: Cardiovascular;  Laterality: N/A;  . RIGHT/LEFT HEART CATH AND CORONARY ANGIOGRAPHY N/A 03/19/2018   Procedure: RIGHT/LEFT HEART CATH AND CORONARY ANGIOGRAPHY;  Surgeon: Larey Dresser, MD;  Location: [facility];  Service: Cardiovascular;  Laterality: N/A;    Family History  Problem Relation Age of Onset  . Ovarian cancer Mother   . Breast cancer Neg Hx   . Hyperparathyroidism Neg Hx   . Colon cancer Neg Hx   . Esophageal cancer Neg Hx   . Stomach cancer Neg Hx     No Known Allergies  Current Outpatient Medications on File Prior to Visit  Medication Sig Dispense Refill  . albuterol (VENTOLIN HFA) 108 (90 Base)  MCG/ACT inhaler INHALE 1-2 PUFFS INTO THE LUNGS EVERY 4 HOURS AS NEEDED FOR WHEEZING OR SHORTNESS OF BREATH. 6.7 g 5  . aspirin EC 81 MG EC tablet Take 1 tablet (81 mg total) by mouth daily. 30 tablet 0  . atorvastatin (LIPITOR) 10 MG tablet TAKE 1 TABLET BY MOUTH EVERYDAY AT BEDTIME 90 tablet  3  . BIDIL 20-37.5 MG tablet TAKE 1 TABLET BY MOUTH THREE TIMES A DAY 270 tablet 0  . bisoprolol (ZEBETA) 5 MG tablet Take 0.5 tablets (2.5 mg total) by mouth daily. 45 tablet 3  . Blood Glucose Calibration (ACCU-CHEK AVIVA) SOLN USE AS INSTRUCTED TO TEST BLOOD SUGAR DAILY 1 each 2  . Blood Glucose Monitoring Suppl (ACCU-CHEK AVIVA PLUS) w/Device KIT Use as instructed to test blood sugar up to 3 times daily 1 kit 0  . cinacalcet (SENSIPAR) 30 MG tablet Take 1 tablet (30 mg total) by mouth daily. 30 tablet 11  . famotidine (PEPCID) 40 MG tablet TAKE 1 TABLET BY MOUTH TWICE A DAY 180 tablet 0  . Fluticasone-Salmeterol (WIXELA INHUB) 250-50 MCG/DOSE AEPB One puff twice daily    . glucose blood (ACCU-CHEK AVIVA PLUS) test strip Use as instructed to test blood sugar up to 3 times daily 300 each 1  . KLOR-CON M10 10 MEQ tablet TAKE 2 TABLETS (20 MEQ TOTAL) BY MOUTH DAILY. FOR LOW POTASSIUM. 60 tablet 3  . Lancets (ACCU-CHEK SOFT TOUCH) lancets Use as instructed to test blood sugar up to 3 times daily 300 each 1  . losartan (COZAAR) 50 MG tablet Take 1 tablet (50 mg total) by mouth daily. 90 tablet 1  . OXYGEN Inhale 2 L into the lungs continuous.     . Polyethylene Glycol 3350 (MIRALAX PO) Take 17 g by mouth as needed.    [name] spironolactone (ALDACTONE) 25 MG tablet Take 1 tablet (25 mg total) by mouth daily. Take at night 30 tablet 6  . Treprostinil (TYVASO) 0.6 MG/ML SOLN Inhale 18 mcg into the lungs 4 (four) times daily.    . [DISCONTINUED] glipiZIDE (GLUCOTROL) 5 MG tablet Take 0.5 tablets (2.5 mg total) by mouth daily before breakfast. 60 tablet 0   No current facility-administered medications on file prior to  visit.     BP 100/64   Pulse 89   Temp 97.8 F (36.6 C) (Temporal)   Ht _0  (1.676 m)   Wt 107 lb (48.5 kg)   SpO2 (!) 80%   BMI 17.27 kg/m    Objective:   Physical Exam  Constitutional: She appears well-nourished.  Neck: Neck supple.  Cardiovascular: Normal rate and regular rhythm.  Respiratory: Effort normal and breath sounds normal. She has no wheezes. She has no rales.  Skin: Skin is warm and dry.  Psychiatric: She has a normal mood and affect.           Assessment & Plan:

## 2018-09-25 NOTE — Patient Instructions (Signed)
You will be contacted regarding your referral to the nutritionist.  Please let us know if you have not been contacted within one week.   Someone will be in touch regarding the wheelchair.  Call Dr. Marigene Ehlers with an update regarding the Tyvaso.  Shortness of Breath/Wheezing/Cough: Use the albuterol inhaler. Inhale 2 puffs into the lungs every 4 to 6 hours as needed for wheezing, cough, and/or shortness of breath.   Continue using Breo inhaler twice daily, everyday.  Please schedule a follow up appointment in 3 months for diabetes check.  It was a pleasure to see you today!

## 2018-09-26 NOTE — Addendum Note (Signed)
Addended by: [name] on: 09/26/2018 03:17 PM   Modules accepted: Orders

## 2018-09-29 DIAGNOSIS — J9611 Chronic respiratory failure with hypoxia: Secondary | ICD-10-CM | POA: Diagnosis not present

## 2018-09-30 ENCOUNTER — Telehealth: Payer: Self-pay | Admitting: Primary Care

## 2018-09-30 ENCOUNTER — Telehealth (HOSPITAL_COMMUNITY): Payer: Self-pay | Admitting: *Deleted

## 2018-09-30 DIAGNOSIS — I272 Pulmonary hypertension, unspecified: Secondary | ICD-10-CM | POA: Diagnosis not present

## 2018-09-30 DIAGNOSIS — J439 Emphysema, unspecified: Secondary | ICD-10-CM | POA: Diagnosis not present

## 2018-09-30 DIAGNOSIS — J9611 Chronic respiratory failure with hypoxia: Secondary | ICD-10-CM | POA: Diagnosis not present

## 2018-09-30 DIAGNOSIS — R600 Localized edema: Secondary | ICD-10-CM | POA: Diagnosis not present

## 2018-09-30 DIAGNOSIS — J449 Chronic obstructive pulmonary disease, unspecified: Secondary | ICD-10-CM | POA: Diagnosis not present

## 2018-09-30 DIAGNOSIS — J9612 Chronic respiratory failure with hypercapnia: Secondary | ICD-10-CM | POA: Diagnosis not present

## 2018-09-30 DIAGNOSIS — E43 Unspecified severe protein-calorie malnutrition: Secondary | ICD-10-CM | POA: Diagnosis not present

## 2018-09-30 DIAGNOSIS — I509 Heart failure, unspecified: Secondary | ICD-10-CM | POA: Diagnosis not present

## 2018-09-30 DIAGNOSIS — E119 Type 2 diabetes mellitus without complications: Secondary | ICD-10-CM | POA: Diagnosis not present

## 2018-09-30 NOTE — Telephone Encounter (Signed)
pts husband left VM wanting to schedule pt an office visit.  Called pt back no answer/left VM

## 2018-09-30 NOTE — Telephone Encounter (Signed)
Patient's husband called and said he was contacted about receiving a wheelchair. Patient's husband lost the phone number for the company.  He'd like someone to call him back with the phone number to the company, so that he can notify them they want the wheelchair.

## 2018-10-02 ENCOUNTER — Telehealth (HOSPITAL_COMMUNITY): Payer: Self-pay

## 2018-10-02 ENCOUNTER — Other Ambulatory Visit (HOSPITAL_COMMUNITY): Payer: Self-pay

## 2018-10-02 DIAGNOSIS — J9611 Chronic respiratory failure with hypoxia: Secondary | ICD-10-CM | POA: Diagnosis not present

## 2018-10-02 NOTE — Progress Notes (Signed)
Paramedicine Encounter   Patient ID: Denise [name] , female,   DOB: 12/20/1945,73 y.o.,  MRN: [id number]   Met patient in clinic today with provider Amy.   [name]'s blood pressure is elevated upon her arrival into the clinic.  She came today to discuss taking the Tyvaso, she says that it's not helping.  There was several medication changes during this visit.  [name] will call me when they get home to revise her pill box.   Stop Tyvaso Increase Bidil  Start lasix   Time spent with patient 25 min    , EMT-Paramedic [phone] 10/03/2018   ACTION: Home visit completed     

## 2018-10-02 NOTE — Telephone Encounter (Signed)
Received message from husband of patient requesting an appt with MD.  Forwarded to scheduler to arrange appt

## 2018-10-03 ENCOUNTER — Other Ambulatory Visit: Payer: Self-pay

## 2018-10-03 ENCOUNTER — Ambulatory Visit (HOSPITAL_COMMUNITY)
Admission: RE | Admit: 2018-10-03 | Discharge: 2018-10-03 | Disposition: A | Discharge status: Home / Self Care | Payer: Medicare HMO | Source: Ambulatory Visit | Attending: Cardiology | Admitting: Cardiology

## 2018-10-03 ENCOUNTER — Encounter (HOSPITAL_COMMUNITY): Payer: Self-pay | Admitting: Cardiology

## 2018-10-03 VITALS — BP 188/120 | HR 81 | Wt 106.5 lb

## 2018-10-03 DIAGNOSIS — I5022 Chronic systolic (congestive) heart failure: Secondary | ICD-10-CM | POA: Insufficient documentation

## 2018-10-03 DIAGNOSIS — I5042 Chronic combined systolic (congestive) and diastolic (congestive) heart failure: Secondary | ICD-10-CM | POA: Diagnosis not present

## 2018-10-03 DIAGNOSIS — I10 Essential (primary) hypertension: Secondary | ICD-10-CM | POA: Diagnosis not present

## 2018-10-03 DIAGNOSIS — E119 Type 2 diabetes mellitus without complications: Secondary | ICD-10-CM | POA: Insufficient documentation

## 2018-10-03 DIAGNOSIS — E785 Hyperlipidemia, unspecified: Secondary | ICD-10-CM | POA: Diagnosis not present

## 2018-10-03 DIAGNOSIS — I272 Pulmonary hypertension, unspecified: Secondary | ICD-10-CM | POA: Insufficient documentation

## 2018-10-03 DIAGNOSIS — Z85038 Personal history of other malignant neoplasm of large intestine: Secondary | ICD-10-CM | POA: Insufficient documentation

## 2018-10-03 DIAGNOSIS — Z7982 Long term (current) use of aspirin: Secondary | ICD-10-CM | POA: Diagnosis not present

## 2018-10-03 DIAGNOSIS — I2721 Secondary pulmonary arterial hypertension: Secondary | ICD-10-CM | POA: Insufficient documentation

## 2018-10-03 DIAGNOSIS — Z87891 Personal history of nicotine dependence: Secondary | ICD-10-CM | POA: Diagnosis not present

## 2018-10-03 DIAGNOSIS — I639 Cerebral infarction, unspecified: Secondary | ICD-10-CM

## 2018-10-03 DIAGNOSIS — Z7951 Long term (current) use of inhaled steroids: Secondary | ICD-10-CM | POA: Diagnosis not present

## 2018-10-03 DIAGNOSIS — Z79899 Other long term (current) drug therapy: Secondary | ICD-10-CM | POA: Diagnosis not present

## 2018-10-03 DIAGNOSIS — Z9981 Dependence on supplemental oxygen: Secondary | ICD-10-CM | POA: Diagnosis not present

## 2018-10-03 DIAGNOSIS — I422 Other hypertrophic cardiomyopathy: Secondary | ICD-10-CM | POA: Diagnosis not present

## 2018-10-03 DIAGNOSIS — J449 Chronic obstructive pulmonary disease, unspecified: Secondary | ICD-10-CM | POA: Insufficient documentation

## 2018-10-03 DIAGNOSIS — I110 Hypertensive heart disease with heart failure: Secondary | ICD-10-CM | POA: Insufficient documentation

## 2018-10-03 HISTORY — DX: Cerebral infarction, unspecified: I63.9

## 2018-10-03 MED ORDER — BIDIL 20-37.5 MG PO TABS: 2.0000 | ORAL_TABLET | Freq: Three times a day (TID) | ORAL | 3 refills | Status: DC

## 2018-10-03 MED ORDER — FUROSEMIDE 40 MG PO TABS: 40.0000 mg | ORAL_TABLET | Freq: Every day | ORAL | 11 refills | Status: DC

## 2018-10-03 NOTE — Progress Notes (Signed)
°  ° ° °Date:  10/03/2018  ° °ID:  Denise [name], DOB 06/26/1945, MRN [id number]    °Provider location: [facility] °Type of Visit: Established patient  ° °PCP:  Clark, Katherine K, NP  °Cardiologist: Dr.  ° °Chief Complaint: Shortness of breath °  °History of Present Illness: °Denise [name] is a 73 y.o. female who has COPD on home oxygen, pulmonary HTN, suspected hypertrophic cardiomyopathy, HLD, DM2, Colon cancer s/p resection, hyperparathyroidism pending surgery, and HTN.  °  °Admitted 9/15 - 09/24/17 for acute on chronic hypoxic respiratory failure. RHC at that time by Dr.  showed significant pulmonary HTN. Thought to be most consistent with WHO group 3/COPD. Hospital course complicated by abdominal pain. GI work up with no clear cause. °  °Seen by Dr. Wert 11/01/17. PFTs that day with significant COPD.  °  °She was hospitalized in 3/20 with PNA/COPD exacerbation and CHF exacerbation.  She was treated with antibiotics and diuresed.  RHC/LHC showed no coronary disease, normal filling pressures, moderate pulmonary hypertension.  V/Q scan showed no chronic PE.  High resolution CT showed moderate COPD, no interstitial lung disease.  Echo was concerning for hypertrophic CMP, so cardiac MRI was done.  LV EF 38%, moderate focal basal septal hypertrophy, mid-wall LGE in a noncoronary pattern suggestive of HCM.  ° °She returns today for followup of CHF and pulmonary hypertension.  I had started her on Tyvaso for possible group 1 component to her pulmonary hypertension.  Unfortunately, she has felt worse with Tyvaso.  She has had a cough since she started to use it.  She is more short of breath as well.  BP is very high today, she says that she takes all her meds.  She is doing less walking, gets short of breath walking around the house.  She is not currently taking any Lasix.  No audible wheezing.  No chest pain.  No orthopnea/PND.  No lightheadedness/syncope. No  palpitations.  ° °ECG (personally reviewed): NSR, PACs, nonspecific T wave flattening  ° °Labs (3/20): RF elevated but anti-CCP negative, SPEP negative, anti-centromere Ab negative, anti-SCL 70 negative, ANA negative anti-Jo-1 negative, HIV negative.  °Labs (4/20): K 3.5, creatinine 1.02, hgb 13, BNP 910, TSH normal °Labs (5/20): K 3.1, creatinine 1.0 °Labs (6/20): K 4.9, pro-BNP 840, K 4.9, creatinine 1.22 ° °PMH: °1. Colon cancer: Surgery 1988.  °2. COPD: Severe, on home oxygen.  °- CT chest (3/20): Moderate emphysema.  °3. Hyperlipidemia °4. HTN °5. Primary hyperparathyroidism °6. Pulmonary hypertension: Suspect there is a component of group 3 PH due to lung disease, but think also out of proportion so may be group 1 component.  °- RHC (9/19): mean RA 3, PA 57/16 mean 32, mean PCWP 5, CI 1.61, PVR 10 WU.  °- High resolution CT chest (3/20) with moderate emphysema, no ILD.  °- Echo (3/20) with mild RV dilation/moderately decreased RV systolic function.  °- LHC/RHC (3/20): Luminal irregularities in coronaries; mean RA 7, PA 59/22 mean 36, mean PCWP 9, CI 2, PVR 8.6 WU.  °- HIV, anti-SCL-70, anti-centromere, CCP, ANA, anti-Jo-1 negative.  °- V/Q scan (9/19): No evidence for chronic PE.  °7. Suspected hypertrophic cardiomyopathy:  °- Echo (3/20) with EF 45%, moderate asymmetric septal hypertrophy, mildly dilated RV with moderately decreased systolic function.  No mitral valve SAM.  °- PYP scan (3/20): Not suggestive of transthyretin amyloidosis.  °- Cardiac MRI (3/20): LV EF 38%, moderate focal basal septal hypertrophy, RV EF 30%, LGE pattern suggestive of   of HCM.    Current Outpatient Medications  Medication Sig Dispense Refill   albuterol (VENTOLIN HFA) 108 (90 Base) MCG/ACT inhaler INHALE 1-2 PUFFS INTO THE LUNGS EVERY 4 HOURS AS NEEDED FOR WHEEZING OR SHORTNESS OF BREATH. 6.7 g 5   aspirin EC 81 MG EC tablet Take 1 tablet (81 mg total) by mouth daily. 30 tablet 0   atorvastatin (LIPITOR) 10 MG tablet TAKE  1 TABLET BY MOUTH EVERYDAY AT BEDTIME 90 tablet 3   bisoprolol (ZEBETA) 5 MG tablet Take 0.5 tablets (2.5 mg total) by mouth daily. 45 tablet 3   Blood Glucose Calibration (ACCU-CHEK AVIVA) SOLN USE AS INSTRUCTED TO TEST BLOOD SUGAR DAILY 1 each 2   Blood Glucose Monitoring Suppl (ACCU-CHEK AVIVA PLUS) w/Device KIT Use as instructed to test blood sugar up to 3 times daily 1 kit 0   cinacalcet (SENSIPAR) 30 MG tablet Take 1 tablet (30 mg total) by mouth daily. 30 tablet 11   famotidine (PEPCID) 40 MG tablet TAKE 1 TABLET BY MOUTH TWICE A DAY 180 tablet 0   Fluticasone-Salmeterol (WIXELA INHUB) 250-50 MCG/DOSE AEPB One puff twice daily     glucose blood (ACCU-CHEK AVIVA PLUS) test strip Use as instructed to test blood sugar up to 3 times daily 300 each 1   isosorbide-hydrALAZINE (BIDIL) 20-37.5 MG tablet Take 2 tablets by mouth 3 (three) times daily. 180 tablet 3   KLOR-CON M10 10 MEQ tablet TAKE 2 TABLETS (20 MEQ TOTAL) BY MOUTH DAILY. FOR LOW POTASSIUM. 60 tablet 3   Lancets (ACCU-CHEK SOFT TOUCH) lancets Use as instructed to test blood sugar up to 3 times daily 300 each 1   losartan (COZAAR) 50 MG tablet Take 1 tablet (50 mg total) by mouth daily. 90 tablet 1   mirtazapine (REMERON) 15 MG tablet TAKE 1 TABLET BY MOUTH AT BEDTIME. FOR DEPRESSION AND APPETITE. 90 tablet 3   OXYGEN Inhale 2 L into the lungs continuous.      Polyethylene Glycol 3350 (MIRALAX PO) Take 17 g by mouth as needed.     spironolactone (ALDACTONE) 25 MG tablet Take 1 tablet (25 mg total) by mouth daily. Take at night 30 tablet 6   furosemide (LASIX) 40 MG tablet Take 1 tablet (40 mg total) by mouth daily. 30 tablet 11   No current facility-administered medications for this encounter.     Allergies:   Patient has no known allergies.   Social History:  The patient  reports that she quit smoking about 31 years ago. Her smoking use included cigarettes. She has a 30.00 pack-year smoking history. She has never  used smokeless tobacco. She reports that she does not drink alcohol or use drugs.   Family History:  The patient's family history includes Ovarian cancer in her mother.   ROS:  Please see the history of present illness.   All other systems are personally reviewed and negative.   Exam:   BP (!) 188/120    Pulse 81    Wt 48.3 kg (106 lb 8 oz)    SpO2 95% Comment: on 4L of O2   BMI 17.19 kg/m  General: NAD Neck: JVP 8-9 cm, no thyromegaly or thyroid nodule.  Lungs: Clear to auscultation bilaterally with normal respiratory effort. CV: Nondisplaced PMI.  Heart regular S1/S2 with PACs, no S3/S4, no murmur.  1+ ankle edema.  No carotid bruit.  Normal pedal pulses.  Abdomen: Soft, nontender, no hepatosplenomegaly, no distention.  Skin: Intact without lesions or rashes.  Neurologic:  and oriented x 3.  °Psych: Normal affect. °Extremities: No clubbing or cyanosis.  °HEENT: Normal.  ° °Recent Labs: °03/15/2018: ALT 24; B Natriuretic Peptide 1,914.0 °03/19/2018: Magnesium 1.8 °05/02/2018: Hemoglobin 13.0; Platelets 206.0; TSH 0.65 °06/24/2018: BUN 27; Creatinine, Ser 1.22; Potassium 4.9; Pro B Natriuretic peptide (BNP) 840.0; Sodium 142  °Personally reviewed  ° °Wt Readings from Last 3 Encounters:  °10/03/18 48.3 kg (106 lb 8 oz)  °10/02/18 47.6 kg (105 lb)  °09/25/18 47.2 kg (104 lb)  °  ° ° °ASSESSMENT AND PLAN: ° °1. COPD: She continues on home oxygen, follows with Dr Wert.  °- Continue regular followup with pulmonary.  °2.Chronic systolic CHF: With prominent RV failure.  Echo 3/20 with EF 45%, asymmetric septal hypertrophy, moderate RV systolic dysfunction. RHC/LHC was done in 3/20, showing no significant CAD and moderate pulmonary arterial hypertension.  Cardiac MRI showed LV EF 38%, moderate basal septal hypertrophy, RV EF 30%, mid-wall LGE in the basal anteroseptal wall and lesser extent in mid lateral wall.  Most concerning for hypertrophic CMP, less likely cardiac amyloidosis.  PYP scan was negative for  evidence of transthyretin amyloidosis and SPEP negative.  She does not have a known family history of HCM.  NYHA class IIIb symptoms, worsened on Tyvaso.  She does appear at least mildly volume overloaded on exam.     °- Start Lasix 40 mg daily with BMET today and in 10 days.  °- Continue bisoprolol 2.5 mg daily.   °- Continue losartan 50 mg daily.   °- Continue spironolactone 25 mg daily.   °- Increase Bidil to 2 tabs tid with markedly elevated BP.   °3. Hypertrophic cardiomyopathy: Imaging has been concerning for HCM without LVOT obstruction or SAM.  No family history of HCM or sudden death.   °4. Pulmonary hypertension: RHC showed moderate pulmonary arterial hypertension with high PVR (8.6 WU).  V/Q scan (9/19) did not show chronic PE. CT chest high resolution showed no ILD, there was moderate emphysema.  Pulmonary hypertension seems to be out of proportion to degree of lung disease.  I suspected mixed group 1 (idiopathic PAH) and group 3 PH.  Serologies sent and came back negative other than RF elevated (CCP negative). HIV negative. She has been on amlodipine in the past without improvement.  I tried her on Tyvaso (to limit V/Q mismatching in the setting of lung parenchymal disease), but she felt worse on this med and wants to stop it.  °- Stop Tyvaso.  I suspect that her pulmonary hypertension is primarily group 3 from COPD. Would hold off on further pulmonary vasodilators.  °5. HTN: BP markedly high.  Increasing Bidil as above.  ° °Recommended follow-up: 3 wks with NP ° °Signed, ° , MD  °10/03/2018 ° °[facility] °[facility] °[address] °[facility] ° [place] [address] °[phone] (office) °[phone] (fax) °

## 2018-10-03 NOTE — Patient Instructions (Signed)
STOP Tyvaso  START Lasix 42m daily  INCREASE Bidil to 2tablets three times daily  Labs in 10 days  Follow up with Amy in 3 weeks

## 2018-10-07 ENCOUNTER — Encounter: Payer: Self-pay | Admitting: Internal Medicine

## 2018-10-07 ENCOUNTER — Other Ambulatory Visit: Payer: Self-pay

## 2018-10-07 ENCOUNTER — Ambulatory Visit (INDEPENDENT_AMBULATORY_CARE_PROVIDER_SITE_OTHER): Payer: Medicare HMO | Admitting: Internal Medicine

## 2018-10-07 DIAGNOSIS — J449 Chronic obstructive pulmonary disease, unspecified: Secondary | ICD-10-CM

## 2018-10-07 DIAGNOSIS — J9611 Chronic respiratory failure with hypoxia: Secondary | ICD-10-CM | POA: Diagnosis not present

## 2018-10-07 DIAGNOSIS — J9612 Chronic respiratory failure with hypercapnia: Secondary | ICD-10-CM | POA: Diagnosis not present

## 2018-10-07 DIAGNOSIS — I272 Pulmonary hypertension, unspecified: Secondary | ICD-10-CM

## 2018-10-07 MED ORDER — PREDNISONE 10 MG PO TABS: ORAL_TABLET | ORAL | 0 refills | Status: DC

## 2018-10-07 NOTE — Assessment & Plan Note (Signed)
Quit smoking 1989 - Spirometry 09/11/2016  FEV1 1.03 (52%)  Ratio 54 mild curvature, on no rx - 09/11/2016  After extensive coaching HFA effectiveness =    50% from baseline 10% > continue  symb 160 2bid but consider change to bevespi or stiolto  - 10/02/2017  After extensive coaching inhaler device,  effectiveness =    50% from a baseline near 0 > rechallenge with symbicort 160 x 2 bid x 2 weeks then re-group  - 10/05/2017    try stiolto x 2pffs x 2 week sample  - PFT's  11/01/2017  FEV1 0.95 (52 % ) ratio fev1/vc = 69%  p 3 % improvement from saba p ? Stiolto(not sure she used it)  prior to study with DLCO  30/30c % corrects to 59 % for alv volume  With air trapping pattern  confirmed on cxr as well - 05/02/2018  After extensive coaching inhaler device,  effectiveness =    75% with dpi device   ?  Group D in terms of symptom/risk and laba/lama/ICS  therefore appropriate rx at this point >>>  Would like to try her on trelegy if insurance covers.  In meantime > Prednisone 10 mg take  4 each am x 2 days,   2 each am x 2 days,  1 each am x 2 days and stop and f/u in 2 weeks with all meds in hand using a trust but verify approach to confirm accurate Medication  Reconciliation The principal here is that until we are certain that the  patients are doing what we've asked, it makes no sense to ask them to do more.

## 2018-10-07 NOTE — Patient Instructions (Signed)
Goal is to keep the 02 saturations above 90% by adjusting the 02 flow up as needed with activity but may need less at rest sitting.   Ok to increase  albuterol up to 2 puffs every 4 hours as needed if you can't catch your breath  Continue wixella for now and we will consider you for trelegy on return   Prednisone 10 mg take  4 each am x 2 days,   2 each am x 2 days,  1 each am x 2 days and stop    Please schedule a follow up office visit in 2  weeks, sooner if needed  with all medications /inhalers/ solutions in hand so we can verify exactly what you are taking. This includes all medications from all doctors and over the counters

## 2018-10-07 NOTE — Assessment & Plan Note (Signed)
See [facility] 09/20/17  With  low CO ? Accuracy of PVR  - tyvaso d/c'd 10/03/18 by mclean due to cough, improved   Leg swelling is reported improved since ov 10/1 and add back lasix to aldactone > rx per Dr Aundra Dubin,

## 2018-10-07 NOTE — Assessment & Plan Note (Signed)
Assoc with ? Cor pulmonale dx 09/2017 so rx 02 1-2 lpm 24/7  - 10/17/2017  Walked RA x 3 laps @ 185 ft each stopped due to  End of study, nl pace,  desat to 87 on 3rd lap - 12/19/2017  Saturations on Room Air at Rest =91 % and while Ambulating = 87%  But  on  2 Liters of pulsed oxygen while Ambulating =93% so rec POC 2lpm walking / use at rest if sats under 90%  - HC03 05/02/2018  = 34  - HC03  06/24/18     = 26 c/w improving hypercarbic component    Adequate control on present rx, reviewed in detail with pt > no change in rx needed  > adjust flow to keep sats > 90% at all times due to cor pulmonale.  Each maintenance medication was reviewed in detail including most importantly the difference between maintenance and as needed and under what circumstances the prns are to be used.  Please see AVS for specific  Instructions which are unique to this visit and I personally typed out  which were reviewed in detail over the phone  with the patient and supportive husband  and a copy mailed to pt

## 2018-10-07 NOTE — Progress Notes (Signed)
Subjective:     Patient ID: Denise [name], female   DOB: 1945/08/02,    MRN: [id number]    Brief patient profile:  27 yobf  Quit smoking 1989 with doe then dx'd with copd in [place] in mid 1990s where lived  There x 25 years and started on spiriva around 2005-10 and helped some and since 2017 back in [place] and breathing generally better in [place] but not doing as many steps and changed from spiriva to symb but not taking it regulary and no need for rescue inhaler referred to pulmonary clinic 09/11/2016 by Dr   Malena Edman initally with GOLD II criteria    History of Present Illness  09/11/2016 1st [facility] office visit/ Denise [name]  Re GOLD II copd  Chief Complaint  Patient presents with  . pulmonary consult    Referred by Dr. Carlis Abbott for COPD. 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 Follow-up    She states she is needing letter releasing her to return to work.  She states her breathing has improved.   Dyspnea:  mb and back flat better since symbicort 160 though hfa quite poor  Cough: none Sleeping: bed flat/ 2 pillows SABA use: rarely since symbicort 02: have it but not using x sometimes hs   rec Plan A = Automatic = Stiolto 2 pffs each am only and no more Symbicort  Work on inhaler technique:  relax and gently blow all the way out then take a nice smooth deep breath back in, triggering the inhaler at same time you start breathing in.  Hold for up to 5 seconds if you can.  Rinse and gargle with water when done Plan B =  Backup Only use your albuterol as a rescue medication to be used if you can't catch your breath by resting or doing a relaxed purse lip breathing pattern.  - The less you use it, the better it will work when you need it. - Ok to use the inhaler up to 2 puffs  every 4 hours if you must but call for appointment if use goes up over your usual need - Don't leave home without it !!  (think of it like the spare tire for your car)  Ok to return to light duty only  Keep previous appt      10/17/2017  f/u ov/Denise [name] re:  GOLD II copd / cor pulmonale - not using 02 as rec  Chief Complaint  Patient presents with  . Follow-up    Breathing is about the same. She has good days and bad days. She is using her albuterol inhaler 2 x daily on average.   Dyspnea:  mb and back with 02 is easier than 02 but still not using it Cough: minimal Sleeping: bed flat/ 2pillows SABA use: sev times a day  02:  Using 2lpm at bedtime and sometimes with activty   rec Continue stiolto  2 pffs each am  02 2lpm at bedtime and 2lpm with any activity        NP 01/09/2018  COPD medication: - Take Stiolto every day- 2 puffs in the morning  - Use Albuterol/Ventolin rescue inhaler only as needed - take 2 puffs every 4-6 hours for shortness of breath or wheezing  Oxygen:  - Wear 1-2L oxygen to keep O2 >90% during the day and on exertion  - Wear 2L oxygen at night  Follow-up: - 2-3 months with Dr. Melvyn Novas and as needed    03/12/2018  f/u ov/Denise [name] re:   GOLD II / cor pulmonale, thoroughly confused with instructions Chief Complaint  Patient presents with  . Follow-up    Breathing worse since the last visit. She is using her albuterol inhaler 2 x per day.   Dyspnea:  Room to room worse since stopped prednisone/ thinks it may have been helping but clear she did not taper it as rec at last ov Cough: no Sleeping: bed flat/ 2-3pillows otherwise can't breathe SABA use: 2 x daily on generic advair  02: 24/7 at 2lpm  rec Pulse  oximetry is available at [facility] and you should adjust the flow to keep it above 90% Depomedrol 120 mg IM today Start back on stiolto 2 pffs each am Only use your albuterol (either ventolin or proair but not both) as a rescue medication Work on inhaler technique:  Add furosemide back at 20 mg daily and this should bring the swelling down      06/26/2018  f/u ov/Denise [name] re: copd/   chronic resp failure with cor pulmonale now on Tyvaso per Dr Aundra Dubin maint on [place] 250 as can't afford stiolto  (wixela is generic advair ) Chief Complaint  Patient presents with  . Follow-up    COPD. Patient reports that without her oxygen its hard for her to breath. She reports using her rescue inhaler at least 2 times daily.   Dyspnea:  Walking in house with therapist is about all she can do > w/c when goes out  Cough: none Sleeping: bed is flat, turned on side  SABA use: twice daily  02: 2lpm 24/7  rec Only use your albuterol (proair)as a rescue medication  Work on inhaler technique:   Virtual Visit via Telephone Note 10/07/2018   I connected with Denise [name] on 10/07/18 at 11:00 AM EDT by telephone and verified that I am speaking with the correct person using two identifiers.   I discussed the limitations, risks, security and privacy concerns of performing an evaluation and management service by telephone and the availability of in person appointments. I also discussed with the patient that there may be a patient responsible charge related to this service. The patient expressed understanding and agreed to proceed.   History of Present Illness: re  copd/   chronic resp failure with cor pulmonale off tyvaso as ? Making her cough per [facility] clinic 10/03/2018  Dyspnea:  No worse tyvaso / no better wixella bid  Cough: better off tyvaso Sleeping: able to lie down  SABA use: once daily seems to help some as did last prednisone  02: 4lpm 24/7 Leg swelling improved since [facility] clinic 10/ 1/20    No  obvious day to day or daytime variability or assoc excess/ purulent sputum or mucus plugs or hemoptysis or cp or chest tightness, subjective wheeze or overt sinus or hb symptoms.    Also denies any obvious fluctuation of symptoms with weather or environmental  changes or other aggravating or alleviating factors except as outlined above.   Meds reviewed/ med reconciliation completed        Observations/Objective: Talking in short phrases, easily confused with details of care.30   Assessment and Plan: See problem list for active a/p's   Follow Up Instructions: See avs for instructions unique to this ov which includes revised/ updated med list     I discussed the assessment and treatment plan with the patient. The patient was provided an opportunity to ask questions and all were answered. The patient agreed with the plan and demonstrated an understanding of the instructions.   The patient was advised to call back or seek an in-person evaluation if the symptoms worsen or if the condition fails to improve as anticipated.  I provided 30 minutes of non-face-to-face time during this encounter.   Christinia Gully, MD

## 2018-10-09 ENCOUNTER — Other Ambulatory Visit (HOSPITAL_COMMUNITY): Payer: Self-pay

## 2018-10-09 NOTE — Progress Notes (Signed)
Paramedicine Encounter    Patient ID: Denise [name], female    DOB: 07/17/1945, 73 y.o.   MRN: [id number]    Patient Care Team: [name], NP as PCP - General (Internal Medicine) [name], MD as Consulting Physician (Cardiology)  Patient Active Problem List   Diagnosis Date Noted  . On home oxygen therapy 07/12/2018  . CHF (congestive heart failure) ([place]) 05/16/2018  . Chronic respiratory failure with hypoxia and hypercapnia ([place]) 05/03/2018  . Borderline abnormal TFTs 05/02/2018  . Type 2 diabetes mellitus ([place]) 04/26/2018  . Unintended weight loss 04/26/2018  . Lower extremity edema 02/20/2018  . Chronic respiratory failure with hypoxia ([place]) 10/02/2017  . Hematuria 09/27/2017  . Pulmonary hypertension ([name]) c/w cor pulmonale   . Protein-calorie malnutrition, severe 09/17/2017  . Constipation   . Hyperparathyroidism, primary ([place]) 07/27/2016  . Loss of appetite 06/02/2016  . COPD GOLD  II   11/22/2015  . Vitamin D deficiency 11/22/2015  . Anxiety and depression 11/22/2015  . Insomnia 11/22/2015  . Hyperlipidemia 11/22/2015  . Essential hypertension 11/22/2015  . Hx of colonic polyps 02/10/2013  . History of colon cancer 06/11/1986    Current Outpatient Medications:  .  atorvastatin (LIPITOR) 10 MG tablet, TAKE 1 TABLET BY MOUTH EVERYDAY AT BEDTIME, Disp: 90 tablet, Rfl: 3 .  famotidine (PEPCID) 40 MG tablet, TAKE 1 TABLET BY MOUTH TWICE A DAY, Disp: 180 tablet, Rfl: 0 .  furosemide (LASIX) 40 MG tablet, Take 1 tablet (40 mg total) by mouth daily., Disp: 30 tablet, Rfl: 11 .  isosorbide-hydrALAZINE (BIDIL) 20-37.5 MG tablet, Take 2 tablets by mouth 3 (three) times daily., Disp: 180 tablet, Rfl: 3 .  KLOR-CON M10 10 MEQ tablet, TAKE 2 TABLETS (20 MEQ TOTAL) BY MOUTH DAILY. FOR LOW POTASSIUM., Disp: 60 tablet, Rfl: 3 .  losartan (COZAAR) 50 MG tablet, Take 1 tablet (50 mg total) by mouth daily., Disp: 90 tablet, Rfl: 1 .  mirtazapine (REMERON) 15 MG  tablet, TAKE 1 TABLET BY MOUTH AT BEDTIME. FOR DEPRESSION AND APPETITE., Disp: 90 tablet, Rfl: 3 .  predniSONE (DELTASONE) 10 MG tablet, Take  4 each am x 2 days,   2 each am x 2 days,  1 each am x 2 days and stop, Disp: 14 tablet, Rfl: 0 .  spironolactone (ALDACTONE) 25 MG tablet, Take 1 tablet (25 mg total) by mouth daily. Take at night, Disp: 30 tablet, Rfl: 6 .  albuterol (VENTOLIN HFA) 108 (90 Base) MCG/ACT inhaler, INHALE 1-2 PUFFS INTO THE LUNGS EVERY 4 HOURS AS NEEDED FOR WHEEZING OR SHORTNESS OF BREATH., Disp: 6.7 g, Rfl: 5 .  aspirin EC 81 MG EC tablet, Take 1 tablet (81 mg total) by mouth daily., Disp: 30 tablet, Rfl: 0 .  bisoprolol (ZEBETA) 5 MG tablet, Take 0.5 tablets (2.5 mg total) by mouth daily., Disp: 45 tablet, Rfl: 3 .  Blood Glucose Calibration (ACCU-CHEK AVIVA) SOLN, USE AS INSTRUCTED TO TEST BLOOD SUGAR DAILY, Disp: 1 each, Rfl: 2 .  Blood Glucose Monitoring Suppl (ACCU-CHEK AVIVA PLUS) w/Device KIT, Use as instructed to test blood sugar up to 3 times daily, Disp: 1 kit, Rfl: 0 .  cinacalcet (SENSIPAR) 30 MG tablet, Take 1 tablet (30 mg total) by mouth daily., Disp: 30 tablet, Rfl: 11 .  Fluticasone-Salmeterol (WIXELA INHUB) 250-50 MCG/DOSE AEPB, One puff twice daily, Disp: , Rfl:  .  glucose blood (ACCU-CHEK AVIVA PLUS) test strip, Use as instructed to test blood sugar up to 3 times  daily, Disp: 300 each, Rfl: 1 .  Lancets (ACCU-CHEK SOFT TOUCH) lancets, Use as instructed to test blood sugar up to 3 times daily, Disp: 300 each, Rfl: 1 .  OXYGEN, Inhale 2 L into the lungs continuous. , Disp: , Rfl:  .  Polyethylene Glycol 3350 (MIRALAX PO), Take 17 g by mouth as needed., Disp: , Rfl:  No Known Allergies   Social History   Socioeconomic History  . Marital status: Married    Spouse name: Not on file  . Number of children: 3  . Years of education: Not on file  . Highest education level: Not on file  Occupational History  . Not on file  Social Needs  . Financial  resource strain: Not on file  . Food insecurity    Worry: Not on file    Inability: Not on file  . Transportation needs    Medical: Not on file    Non-medical: Not on file  Tobacco Use  . Smoking status: Former Smoker    Packs/day: 1.00    Years: 30.00    Pack years: 30.00    Types: Cigarettes    Quit date: 1989    Years since quitting: 31.7  . Smokeless tobacco: Never Used  Substance and Sexual Activity  . Alcohol use: No  . Drug use: No  . Sexual activity: Yes    Partners: Male    Birth control/protection: Post-menopausal, Surgical  Lifestyle  . Physical activity    Days per week: Not on file    Minutes per session: Not on file  . Stress: Not on file  Relationships  . Social Herbalist on phone: Not on file    Gets together: Not on file    Attends religious service: Not on file    Active member of club or organization: Not on file    Attends meetings of clubs or organizations: Not on file    Relationship status: Not on file  . Intimate partner violence    Fear of current or ex partner: Not on file    Emotionally abused: Not on file    Physically abused: Not on file    Forced sexual activity: Not on file  Other Topics Concern  . Not on file  Social History Narrative   Married. 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  Her RR increased when she walked from the bathroom to the kitchen.  Her husband increased the  oxygen flow to 6 lpm for a few mins.  She denies chest pain and dizziness. She's taken all of her medications for the past week. She saw Dr. Melvyn Novas yesterday and was prescribed prednisone.  I fill her pill box according to the recommendations from her last appointment with Dr. Aundra Dubin.  Prednisone was placed in her  pill box as prescribed also.  Her lung sounds were tight with both expiratory and inspiratory; she hasn't used her albuterol inhaler.  I told her that the albuterol inhaler should be taken every 4 hours as needed.  She used it during our visit; I encouraged her to take it later today if needed.  I also advised her to call 911, if she gets worst and not better tonight.  She said that she felt a lot better a few mins after using the inhaler.    Medication ordered: zeita Losartan  Kaston Faughn, EMT Paramedic [phone] 10/09/2018    ACTION: Home visit completed

## 2018-10-14 DIAGNOSIS — H52223 Regular astigmatism, bilateral: Secondary | ICD-10-CM | POA: Diagnosis not present

## 2018-10-14 DIAGNOSIS — H524 Presbyopia: Secondary | ICD-10-CM | POA: Diagnosis not present

## 2018-10-14 DIAGNOSIS — H11421 Conjunctival edema, right eye: Secondary | ICD-10-CM | POA: Diagnosis not present

## 2018-10-14 DIAGNOSIS — E119 Type 2 diabetes mellitus without complications: Secondary | ICD-10-CM | POA: Diagnosis not present

## 2018-10-14 DIAGNOSIS — H5203 Hypermetropia, bilateral: Secondary | ICD-10-CM | POA: Diagnosis not present

## 2018-10-14 DIAGNOSIS — I10 Essential (primary) hypertension: Secondary | ICD-10-CM | POA: Diagnosis not present

## 2018-10-14 LAB — HM DIABETES EYE EXAM

## 2018-10-16 ENCOUNTER — Other Ambulatory Visit (HOSPITAL_COMMUNITY): Payer: Self-pay

## 2018-10-16 NOTE — Progress Notes (Signed)
Paramedicine Encounter    Patient ID: Denise [name], female    DOB: [birth year], 73 y.o.   MRN: [id number]    Patient Care Team: [name], NP as PCP - General (Internal Medicine) [name], MD as Consulting Physician (Cardiology)  Patient Active Problem List   Diagnosis Date Noted  . On home oxygen therapy 07/12/2018  . CHF (congestive heart failure) ([place]) 05/16/2018  . Chronic respiratory failure with hypoxia and hypercapnia ([place]) 05/03/2018  . Borderline abnormal TFTs 05/02/2018  . Type 2 diabetes mellitus ([place]) 04/26/2018  . Unintended weight loss 04/26/2018  . Lower extremity edema 02/20/2018  . Chronic respiratory failure with hypoxia ([place]) 10/02/2017  . Hematuria 09/27/2017  . Pulmonary hypertension ([name]) c/w cor pulmonale   . Protein-calorie malnutrition, severe 09/17/2017  . Constipation   . Hyperparathyroidism, primary ([place]) 07/27/2016  . Loss of appetite 06/02/2016  . COPD GOLD  II   11/22/2015  . Vitamin D deficiency 11/22/2015  . Anxiety and depression 11/22/2015  . Insomnia 11/22/2015  . Hyperlipidemia 11/22/2015  . Essential hypertension 11/22/2015  . Hx of colonic polyps 02/10/2013  . History of colon cancer 06/11/1986    Current Outpatient Medications:  .  aspirin EC 81 MG EC tablet, Take 1 tablet (81 mg total) by mouth daily., Disp: 30 tablet, Rfl: 0 .  atorvastatin (LIPITOR) 10 MG tablet, TAKE 1 TABLET BY MOUTH EVERYDAY AT BEDTIME, Disp: 90 tablet, Rfl: 3 .  cinacalcet (SENSIPAR) 30 MG tablet, Take 1 tablet (30 mg total) by mouth daily., Disp: 30 tablet, Rfl: 11 .  famotidine (PEPCID) 40 MG tablet, TAKE 1 TABLET BY MOUTH TWICE A DAY, Disp: 180 tablet, Rfl: 0 .  Fluticasone-Salmeterol (WIXELA INHUB) 250-50 MCG/DOSE AEPB, One puff twice daily, Disp: , Rfl:  .  furosemide (LASIX) 40 MG tablet, Take 1 tablet (40 mg total) by mouth daily., Disp: 30 tablet, Rfl: 11 .  isosorbide-hydrALAZINE (BIDIL) 20-37.5 MG tablet, Take 2 tablets by mouth  3 (three) times daily., Disp: 180 tablet, Rfl: 3 .  KLOR-CON M10 10 MEQ tablet, TAKE 2 TABLETS (20 MEQ TOTAL) BY MOUTH DAILY. FOR LOW POTASSIUM., Disp: 60 tablet, Rfl: 3 .  losartan (COZAAR) 50 MG tablet, Take 1 tablet (50 mg total) by mouth daily., Disp: 90 tablet, Rfl: 1 .  mirtazapine (REMERON) 15 MG tablet, TAKE 1 TABLET BY MOUTH AT BEDTIME. FOR DEPRESSION AND APPETITE., Disp: 90 tablet, Rfl: 3 .  OXYGEN, Inhale 2 L into the lungs continuous. , Disp: , Rfl:  .  spironolactone (ALDACTONE) 25 MG tablet, Take 1 tablet (25 mg total) by mouth daily. Take at night, Disp: 30 tablet, Rfl: 6 .  albuterol (VENTOLIN HFA) 108 (90 Base) MCG/ACT inhaler, INHALE 1-2 PUFFS INTO THE LUNGS EVERY 4 HOURS AS NEEDED FOR WHEEZING OR SHORTNESS OF BREATH., Disp: 6.7 g, Rfl: 5 .  bisoprolol (ZEBETA) 5 MG tablet, Take 0.5 tablets (2.5 mg total) by mouth daily., Disp: 45 tablet, Rfl: 3 .  Blood Glucose Calibration (ACCU-CHEK AVIVA) SOLN, USE AS INSTRUCTED TO TEST BLOOD SUGAR DAILY, Disp: 1 each, Rfl: 2 .  Blood Glucose Monitoring Suppl (ACCU-CHEK AVIVA PLUS) w/Device KIT, Use as instructed to test blood sugar up to 3 times daily, Disp: 1 kit, Rfl: 0 .  glucose blood (ACCU-CHEK AVIVA PLUS) test strip, Use as instructed to test blood sugar up to 3 times daily, Disp: 300 each, Rfl: 1 .  Lancets (ACCU-CHEK SOFT TOUCH) lancets, Use as instructed to test blood sugar up to  3 times daily, Disp: 300 each, Rfl: 1 .  Polyethylene Glycol 3350 (MIRALAX PO), Take 17 g by mouth as needed., Disp: , Rfl:  .  predniSONE (DELTASONE) 10 MG tablet, Take  4 each am x 2 days,   2 each am x 2 days,  1 each am x 2 days and stop, Disp: 14 tablet, Rfl: 0 No Known Allergies   Social History   Socioeconomic History  . Marital status: Married    Spouse name: Not on file  . Number of children: 3  . Years of education: Not on file  . Highest education level: Not on file  Occupational History  . Not on file  Social Needs  . Financial resource  strain: Not on file  . Food insecurity    Worry: Not on file    Inability: Not on file  . Transportation needs    Medical: Not on file    Non-medical: Not on file  Tobacco Use  . Smoking status: Former Smoker    Packs/day: 1.00    Years: 30.00    Pack years: 30.00    Types: Cigarettes    Quit date: 1989    Years since quitting: 31.8  . Smokeless tobacco: Never Used  Substance and Sexual Activity  . Alcohol use: No  . Drug use: No  . Sexual activity: Yes    Partners: Male    Birth control/protection: Post-menopausal, Surgical  Lifestyle  . Physical activity    Days per week: Not on file    Minutes per session: Not on file  . Stress: Not on file  Relationships  . Social Herbalist on phone: Not on file    Gets together: Not on file    Attends religious service: Not on file    Active member of club or organization: Not on file    Attends meetings of clubs or organizations: Not on file    Relationship status: Not on file  . Intimate partner violence    Fear of current or ex partner: Not on file    Emotionally abused: Not on file    Physically abused: Not on file    Forced sexual activity: Not on file  Other Topics Concern  . Not on file  Social History Narrative   Married. 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  Abdominal:     General: There is no distension.  Skin:    General: Skin is warm and dry.         Future Appointments  Date Time Provider [place]  10/22/2018 10:45 AM Tanda Rockers, MD [facility] None  10/24/2018 11:30 AM [facility] PA/NP [facility] None  12/23/2018 11:00 AM [name], NP [facility]     BP 138/70 (BP Location: Left Arm, Patient Position: Sitting, Cuff Size: Normal)   Pulse 91   Temp 99.1 F (37.3 C)   Wt 102 lb (46.3 kg)   SpO2 (!)  86%   BMI 16.46 kg/m   Weight yesterday-102 Last visit weight-108  ATF pt CAO to her norm sitting in the kitchen with her husband.  He said that she has been breathing a lot better since taking the prednisone.  He said that pt walked around this weekend  more and without as much sob as usual. She also had enough strength to take a shower without assistance.  Pt denies increase in difficulty breathing, chest pain and dizziness.  She's taken all of her medications this week.  rx bottles verified and pill box refilled. Prednisone completed.    Medication ordered: bisprolol (zebeta)  Ethan Clayburn, EMT Paramedic [phone] 10/16/2018    ACTION: Home visit completed

## 2018-10-22 ENCOUNTER — Encounter: Payer: Self-pay | Admitting: Primary Care

## 2018-10-22 ENCOUNTER — Other Ambulatory Visit: Payer: Self-pay

## 2018-10-22 ENCOUNTER — Encounter: Payer: Self-pay | Admitting: Internal Medicine

## 2018-10-22 ENCOUNTER — Ambulatory Visit (INDEPENDENT_AMBULATORY_CARE_PROVIDER_SITE_OTHER): Payer: Medicare HMO | Admitting: Internal Medicine

## 2018-10-22 DIAGNOSIS — J449 Chronic obstructive pulmonary disease, unspecified: Secondary | ICD-10-CM

## 2018-10-22 DIAGNOSIS — I272 Pulmonary hypertension, unspecified: Secondary | ICD-10-CM | POA: Diagnosis not present

## 2018-10-22 DIAGNOSIS — J9611 Chronic respiratory failure with hypoxia: Secondary | ICD-10-CM

## 2018-10-22 DIAGNOSIS — J9612 Chronic respiratory failure with hypercapnia: Secondary | ICD-10-CM | POA: Diagnosis not present

## 2018-10-22 MED ORDER — PREDNISONE 10 MG PO TABS: ORAL_TABLET | ORAL | 0 refills | Status: DC

## 2018-10-22 NOTE — Progress Notes (Signed)
Subjective:     Patient ID: Denise [name], female   DOB: 1945/10/13,    MRN: [id number]    Brief patient profile:  6 yobf  Quit smoking 1989 with doe then dx'd with copd in [place] in mid 1990s where lived  There x 25 years and started on spiriva around 2005-10 and helped some and since 2017 back in [place] and breathing generally better in [place] but not doing as many steps and changed from spiriva to symb but not taking it regulary and no need for rescue inhaler referred to pulmonary clinic 09/11/2016 by Dr   Denise [name] initally with GOLD II criteria    History of Present Illness  09/11/2016 1st [facility] office visit/ Denise [name]  Re GOLD II copd  Chief Complaint  Patient presents with  . pulmonary consult    Referred by Dr. Carlis [name] for COPD. 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 Follow-up    She states she is needing letter releasing her to return to work.  She states her breathing has improved.   Dyspnea:  mb and back flat better since symbicort 160 though hfa quite poor  Cough: none Sleeping: bed flat/ 2 pillows SABA use: rarely since symbicort 02: have it but not using x sometimes hs   rec Plan A = Automatic = Stiolto 2 pffs each am only and no more Symbicort  Work on inhaler technique:  relax and gently blow all the way out then take a nice smooth deep breath back in, triggering the inhaler at same time you start breathing in.  Hold for up to 5 seconds if you can.  Rinse and gargle with water when done Plan  B = Backup Only use your albuterol as a rescue medication to be used if you can't catch your breath by resting or doing a relaxed purse lip breathing pattern.  - The less you use it, the better it will work when you need it. - Ok to use the inhaler up to 2 puffs  every 4 hours if you must but call for appointment if use goes up over your usual need - Don't leave home without it !!  (think of it like the spare tire for your car)  Ok to return to light duty only  Keep previous appt      10/17/2017  f/u ov/Denise [name] re:  GOLD II copd / cor pulmonale - not using 02 as rec  Chief Complaint  Patient presents with  . Follow-up    Breathing is about the same. She has good days and bad days. She is using her albuterol inhaler 2 x daily on average.   Dyspnea:  mb and back with 02 is easier than 02 but still not using it Cough: minimal Sleeping: bed flat/ 2pillows SABA use: sev times a day  02:  Using 2lpm at bedtime and sometimes with activty   rec Continue stiolto  2 pffs each am  02 2lpm at bedtime and 2lpm with any activity        NP 01/09/2018  COPD medication: - Take Stiolto every day- 2 puffs in the morning  - Use Albuterol/Ventolin rescue inhaler only as needed - take 2 puffs every 4-6 hours for shortness of breath or wheezing  Oxygen:  - Wear 1-2L oxygen to keep O2 >90% during the day and on exertion  - Wear 2L oxygen at night  Follow-up: - 2-3 months with Dr. Melvyn [name] and as needed    03/12/2018  f/u ov/Denise [name] re:   GOLD II / cor pulmonale, thoroughly confused with instructions Chief Complaint  Patient presents with  . Follow-up    Breathing worse since the last visit. She is using her albuterol inhaler 2 x per day.   Dyspnea:  Room to room worse since stopped prednisone/ thinks it may have been helping but clear she did not taper it as rec at last ov Cough: no Sleeping: bed flat/ 2-3pillows otherwise can't breathe SABA use: 2 x daily on generic advair  02: 24/7 at 2lpm  rec Pulse  oximetry is available at [facility] and you should adjust the flow to keep it above 90% Depomedrol 120 mg IM today Start back on stiolto 2 pffs each am Only use your albuterol (either ventolin or proair but not both) as a rescue medication Work on inhaler technique:  Add furosemide back at 20 mg daily and this should bring the swelling down      06/26/2018  f/u ov/Denise [name] re: copd/ cor pulmonale/ chronic resp failure with cor pulmonale now on Tyvaso per Dr Denise [name] maint on wixela 250 as can't afford stiolto and wixela is generic advair  Chief Complaint  Patient presents with  . Follow-up    COPD. Patient reports that without her oxygen its hard for her to breath. She reports using her rescue inhaler at least 2 times daily.   Dyspnea:  Walking in house with therapist is about all she can do > w/c when goes out  Cough: none Sleeping: bed is flat, turned on side  SABA use: twice daily  02: 2lpm 24/7  rec Only use your albuterol (proair)as a rescue medication  Work on inhaler technique:    10/03/18 cards note unable to tol yvaso = cough and sob worse so suspect all [place] [facility]   10/07/18 Phone visit  rec Goal is to keep the 02 saturations above 90% by adjusting the 02 flow up as needed with activity but may need less at rest sitting.  Ok to increase  albuterol up to 2 puffs every 4 hours as needed if you can't catch your breath Continue wixella for now and we will consider you for trelegy on return  Prednisone 10 mg take  4 each am x 2 days,   2 each am x 2 days,  1 each am x 2 days and stop     10/22/2018  f/u ov/Denise [name] re: copd/ cor pulmonale - again thoroughly confused with details of care as is her husband Chief Complaint  Patient presents with  . Follow-up    Pt states her breathing has been progressively worse since the last visit. She is using her albuterol inhaler 2 x daily on average.    Dyspnea:  Room to room / better on prednisone  Cough: dry  Sleeping: on side  SABA use: as  above  02: 24/7 adjusts 2-4lpm    No obvious day to day  or daytime variability or assoc excess/ purulent sputum or mucus plugs or hemoptysis or cp or chest tightness, subjective wheeze or overt sinus or hb symptoms.   Sleeping  without nocturnal  or early am exacerbation  of respiratory  c/o's or need for noct saba. Also denies any obvious fluctuation of symptoms with weather or environmental changes or other aggravating or alleviating factors except as outlined above   No unusual exposure hx or h/o childhood pna/ asthma or knowledge of premature birth.  Current Allergies, Complete Past Medical History, Past Surgical History, Family History, and Social History were reviewed in [employer] record.  ROS  The following are not active complaints unless bolded Hoarseness, sore throat, dysphagia, dental problems, itching, sneezing,  nasal congestion or discharge of excess mucus or purulent secretions, ear ache,   fever, chills, sweats, unintended wt loss or wt gain, classically pleuritic or exertional cp,  orthopnea pnd or arm/hand swelling  or leg swelling, presyncope, palpitations, abdominal pain, anorexia, nausea, vomiting, diarrhea  or change in bowel habits or change in bladder habits, change in stools or change in urine, dysuria, hematuria,  rash, arthralgias, visual complaints, headache, numbness, weakness or ataxia or problems with walking or coordination,  change in mood or  memory.        Current Meds - - NOTE:   Unable to verify as accurately reflecting what pt takes     Medication Sig  . albuterol (VENTOLIN HFA) 108 (90 Base) MCG/ACT inhaler INHALE 1-2 PUFFS INTO THE LUNGS EVERY 4 HOURS AS NEEDED FOR WHEEZING OR SHORTNESS OF BREATH.  [name] aspirin EC 81 MG EC tablet Take 1 tablet (81 mg total) by mouth daily.  [name] atorvastatin (LIPITOR) 10 MG tablet TAKE 1 TABLET BY MOUTH EVERYDAY AT BEDTIME  . Blood Glucose Calibration (ACCU-CHEK AVIVA) SOLN USE AS INSTRUCTED TO TEST  BLOOD SUGAR DAILY  . Blood Glucose Monitoring Suppl (ACCU-CHEK AVIVA PLUS) w/Device KIT Use as instructed to test blood sugar up to 3 times daily  . famotidine (PEPCID) 40 MG tablet TAKE 1 TABLET BY MOUTH TWICE A DAY  . fluticasone furoate-vilanterol (BREO ELLIPTA) 100-25 MCG/INH AEPB Inhale 1 puff into the lungs daily.  . furosemide (LASIX) 40 MG tablet Take 1 tablet (40 mg total) by mouth daily.  [name] glucose blood (ACCU-CHEK AVIVA PLUS) test strip Use as instructed to test blood sugar up to 3 times daily  . isosorbide-hydrALAZINE (BIDIL) 20-37.5 MG tablet Take 2 tablets by mouth 3 (three) times daily.  [name] KLOR-CON M10 10 MEQ tablet TAKE 2 TABLETS (20 MEQ TOTAL) BY MOUTH DAILY. FOR LOW POTASSIUM.  [name] Lancets (ACCU-CHEK SOFT TOUCH) lancets Use as instructed to test blood sugar up to 3 times daily  . losartan (COZAAR) 50 MG tablet Take 1 tablet (50 mg total) by mouth daily.  . mirtazapine (REMERON) 15 MG tablet TAKE 1 TABLET BY MOUTH AT BEDTIME. FOR DEPRESSION AND APPETITE.  [name] omeprazole (PRILOSEC) 20 MG capsule Take 20 mg by mouth daily.  . OXYGEN Inhale 2 L into the lungs continuous.   . Polyethylene Glycol 3350 (MIRALAX PO) Take 17 g by mouth as needed.  [name] spironolactone (ALDACTONE) 25 MG tablet Take 1 tablet (25 mg total) by mouth daily. Take at night           Objective:   Physical Exam  wc bound very frail elderly bf nad   10/22/2018      108  06/26/2018        103  05/02/2018        108  03/12/2018        128  12/19/2017       112  11/01/2017        118  10/17/2017        114  10/05/2017        120   10/02/2017        119   07/27/16 144 lb 12.8 oz (65.7 kg)  06/02/16 136 lb 6.4 oz (61.9 kg)  11/22/15 156 lb 12.8 oz (71.1 kg)    Vital signs reviewed - Note on arrival 02 sats  93% on 4lpm cont           Reports full dentures  HEENT : pt wearing mask not removed for exam due to covid -19 concerns.    NECK :  without JVD/Nodes/TM/ nl carotid upstrokes bilaterally   LUNGS: no  acc muscle use,  Mod barrel  contour chest wall with bilateral  Distant bs s audible wheeze and  without cough on insp or exp maneuvers and mod  Hyperresonant  to  percussion bilaterally     CV:  RRR  no s3 or murmur or increase in P2, and no edema   ABD:  soft and nontender with pos mid insp Hoover's  in the supine position. No bruits or organomegaly appreciated, bowel sounds nl  MS:     ext warm with with gen muscle wasting but s calf tenderness, cyanosis or clubbing No obvious joint restrictions   SKIN: warm and dry without lesions    NEURO:  alert, approp, nl sensorium with  no motor or cerebellar deficits apparent.            Assessment:

## 2018-10-22 NOTE — Patient Instructions (Addendum)
Prednisone 10  Mg Take 4 for three days 3 for three days 2 for three days 1 for three days and stop   Stop Breo and start Trelegy one click each am    Only use your albuterol as a rescue medication to be used if you can't catch your breath by resting or doing a relaxed purse lip breathing pattern.  - The less you use it, the better it will work when you need it. - Ok to use up to 2 puffs  every 4 hours if you must but call for immediate appointment if use goes up over your usual need - Don't leave home without it !!  (think of it like the spare tire for your car)    Please schedule a follow up office visit in 2 weeks, sooner if needed  - add: be sure to clarify on each ov how she is using 02

## 2018-10-23 ENCOUNTER — Other Ambulatory Visit (HOSPITAL_COMMUNITY): Payer: Self-pay

## 2018-10-23 NOTE — Progress Notes (Signed)
Paramedicine Encounter    Patient ID: Enid Derry A. Hashemi, female    DOB: 1945/11/21, 73 y.o.   MRN: [id number]    Patient Care Team: [name], NP as PCP - General (Internal Medicine) [name], MD as Consulting Physician (Cardiology)  Patient Active Problem List   Diagnosis Date Noted  . On home oxygen therapy 07/12/2018  . CHF (congestive heart failure) ([place]) 05/16/2018  . Chronic respiratory failure with hypoxia and hypercapnia ([place]) 05/03/2018  . Borderline abnormal TFTs 05/02/2018  . Type 2 diabetes mellitus ([place]) 04/26/2018  . Unintended weight loss 04/26/2018  . Lower extremity edema 02/20/2018  . Chronic respiratory failure with hypoxia ([place]) 10/02/2017  . Hematuria 09/27/2017  . Pulmonary hypertension ([name]) c/w cor pulmonale   . Protein-calorie malnutrition, severe 09/17/2017  . Constipation   . Hyperparathyroidism, primary ([place]) 07/27/2016  . Loss of appetite 06/02/2016  . COPD GOLD  II   11/22/2015  . Vitamin D deficiency 11/22/2015  . Anxiety and depression 11/22/2015  . Insomnia 11/22/2015  . Hyperlipidemia 11/22/2015  . Essential hypertension 11/22/2015  . Hx of colonic polyps 02/10/2013  . History of colon cancer 06/11/1986    Current Outpatient Medications:  .  aspirin EC 81 MG EC tablet, Take 1 tablet (81 mg total) by mouth daily., Disp: 30 tablet, Rfl: 0 .  atorvastatin (LIPITOR) 10 MG tablet, TAKE 1 TABLET BY MOUTH EVERYDAY AT BEDTIME, Disp: 90 tablet, Rfl: 3 .  famotidine (PEPCID) 40 MG tablet, TAKE 1 TABLET BY MOUTH TWICE A DAY, Disp: 180 tablet, Rfl: 0 .  furosemide (LASIX) 40 MG tablet, Take 1 tablet (40 mg total) by mouth daily., Disp: 30 tablet, Rfl: 11 .  isosorbide-hydrALAZINE (BIDIL) 20-37.5 MG tablet, Take 2 tablets by mouth 3 (three) times daily., Disp: 180 tablet, Rfl: 3 .  KLOR-CON M10 10 MEQ tablet, TAKE 2 TABLETS (20 MEQ TOTAL) BY MOUTH DAILY. FOR LOW POTASSIUM., Disp: 60 tablet, Rfl: 3 .  losartan (COZAAR) 50 MG tablet,  Take 1 tablet (50 mg total) by mouth daily., Disp: 90 tablet, Rfl: 1 .  mirtazapine (REMERON) 15 MG tablet, TAKE 1 TABLET BY MOUTH AT BEDTIME. FOR DEPRESSION AND APPETITE., Disp: 90 tablet, Rfl: 3 .  omeprazole (PRILOSEC) 20 MG capsule, Take 20 mg by mouth daily., Disp: , Rfl:  .  OXYGEN, Inhale 2 L into the lungs continuous. , Disp: , Rfl:  .  predniSONE (DELTASONE) 10 MG tablet, Take 4 for three days 3 for three days 2 for three days 1 for three days and stop, Disp: 30 tablet, Rfl: 0 .  spironolactone (ALDACTONE) 25 MG tablet, Take 1 tablet (25 mg total) by mouth daily. Take at night, Disp: 30 tablet, Rfl: 6 .  albuterol (VENTOLIN HFA) 108 (90 Base) MCG/ACT inhaler, INHALE 1-2 PUFFS INTO THE LUNGS EVERY 4 HOURS AS NEEDED FOR WHEEZING OR SHORTNESS OF BREATH., Disp: 6.7 g, Rfl: 5 .  Blood Glucose Calibration (ACCU-CHEK AVIVA) SOLN, USE AS INSTRUCTED TO TEST BLOOD SUGAR DAILY, Disp: 1 each, Rfl: 2 .  Blood Glucose Monitoring Suppl (ACCU-CHEK AVIVA PLUS) w/Device KIT, Use as instructed to test blood sugar up to 3 times daily, Disp: 1 kit, Rfl: 0 .  glucose blood (ACCU-CHEK AVIVA PLUS) test strip, Use as instructed to test blood sugar up to 3 times daily, Disp: 300 each, Rfl: 1 .  Lancets (ACCU-CHEK SOFT TOUCH) lancets, Use as instructed to test blood sugar up to 3 times daily, Disp: 300 each, Rfl: 1 .  Polyethylene Glycol 3350 (MIRALAX PO), Take 17 g by mouth as needed., Disp: , Rfl:  No Known Allergies   Social History   Socioeconomic History  . Marital status: Married    Spouse name: Not on file  . Number of children: 3  . Years of education: Not on file  . Highest education level: Not on file  Occupational History  . Not on file  Social Needs  . Financial resource strain: Not on file  . Food insecurity    Worry: Not on file    Inability: Not on file  . Transportation needs    Medical: Not on file    Non-medical: Not on file  Tobacco Use  . Smoking status: Former Smoker     Packs/day: 1.00    Years: 30.00    Pack years: 30.00    Types: Cigarettes    Quit date: 1989    Years since quitting: 31.8  . Smokeless tobacco: Never Used  Substance and Sexual Activity  . Alcohol use: No  . Drug use: No  . Sexual activity: Yes    Partners: Male    Birth control/protection: Post-menopausal, Surgical  Lifestyle  . Physical activity    Days per week: Not on file    Minutes per session: Not on file  . Stress: Not on file  Relationships  . Social Herbalist on phone: Not on file    Gets together: Not on file    Attends religious service: Not on file    Active member of club or organization: Not on file    Attends meetings of clubs or organizations: Not on file    Relationship status: Not on file  . Intimate partner violence    Fear of current or ex partner: Not on file    Emotionally abused: Not on file    Physically abused: Not on file    Forced sexual activity: Not on file  Other Topics Concern  . Not on file  Social History Narrative   Married. 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  Abdominal:     General: There is no distension.  Skin:    General: Skin is warm and dry.         Future Appointments  Date Time Provider [place]  10/24/2018 11:30 AM [facility] PA/NP [facility] None  11/05/2018 11:00 AM Tanda Rockers, MD [facility] None  12/23/2018 11:00 AM [name], NP [facility]     BP 138/80 (BP Location: Left Arm, Patient Position: Sitting, Cuff Size: Normal)   Pulse 76   Temp 97.8 F (36.6 C)   Wt 108 lb (49 kg)   BMI 17.43 kg/m   Weight yesterday-108 Last visit weight-102  ATF pt CAO x4 sitting in the kitchen.  She said that she feels good except for the sob,  "can't catch my breath". The sob is per her norm she says.  Mrs. Amacher saw Dr.  Melvyn Novas yesterday who prescribed her more prednisone to take until her next appointment.  She has no other complaints today and she's taken all of her medications for the week.  Her husband said that she's eating a lot more than before and she's trying to be more active.  rx bottles verified  and pill box refilled.  Prednisone placed in her pill box according to the instructions on the prescription bottle.    Her husband is aware of her appointment tomorrow.  I reminded her to take her meds prior to her visit and bring her medications with her.    Prednisone continues 1 tab for 2 more days next week.  Medication ordered:  none  Narayan Scull, EMT Paramedic [phone] 10/23/2018    ACTION: Home visit completed

## 2018-10-24 ENCOUNTER — Other Ambulatory Visit: Payer: Self-pay

## 2018-10-24 ENCOUNTER — Ambulatory Visit (HOSPITAL_COMMUNITY)
Admission: RE | Admit: 2018-10-24 | Discharge: 2018-10-24 | Disposition: A | Discharge status: Home / Self Care | Payer: Medicare HMO | Source: Ambulatory Visit | Attending: Internal Medicine | Admitting: Internal Medicine

## 2018-10-24 ENCOUNTER — Encounter: Payer: Self-pay | Admitting: Internal Medicine

## 2018-10-24 ENCOUNTER — Other Ambulatory Visit (HOSPITAL_COMMUNITY): Payer: Self-pay

## 2018-10-24 VITALS — BP 140/88 | HR 88 | Wt 106.6 lb

## 2018-10-24 DIAGNOSIS — Z79899 Other long term (current) drug therapy: Secondary | ICD-10-CM | POA: Diagnosis not present

## 2018-10-24 DIAGNOSIS — Z87891 Personal history of nicotine dependence: Secondary | ICD-10-CM | POA: Insufficient documentation

## 2018-10-24 DIAGNOSIS — I5022 Chronic systolic (congestive) heart failure: Secondary | ICD-10-CM | POA: Diagnosis not present

## 2018-10-24 DIAGNOSIS — R0602 Shortness of breath: Secondary | ICD-10-CM | POA: Diagnosis not present

## 2018-10-24 DIAGNOSIS — R5383 Other fatigue: Secondary | ICD-10-CM | POA: Insufficient documentation

## 2018-10-24 DIAGNOSIS — I110 Hypertensive heart disease with heart failure: Secondary | ICD-10-CM | POA: Diagnosis not present

## 2018-10-24 DIAGNOSIS — Z7982 Long term (current) use of aspirin: Secondary | ICD-10-CM | POA: Diagnosis not present

## 2018-10-24 DIAGNOSIS — I2721 Secondary pulmonary arterial hypertension: Secondary | ICD-10-CM | POA: Diagnosis not present

## 2018-10-24 DIAGNOSIS — I10 Essential (primary) hypertension: Secondary | ICD-10-CM | POA: Diagnosis not present

## 2018-10-24 DIAGNOSIS — I272 Pulmonary hypertension, unspecified: Secondary | ICD-10-CM | POA: Insufficient documentation

## 2018-10-24 DIAGNOSIS — Z9981 Dependence on supplemental oxygen: Secondary | ICD-10-CM | POA: Insufficient documentation

## 2018-10-24 DIAGNOSIS — J449 Chronic obstructive pulmonary disease, unspecified: Secondary | ICD-10-CM | POA: Insufficient documentation

## 2018-10-24 DIAGNOSIS — I5042 Chronic combined systolic (congestive) and diastolic (congestive) heart failure: Secondary | ICD-10-CM

## 2018-10-24 DIAGNOSIS — E785 Hyperlipidemia, unspecified: Secondary | ICD-10-CM | POA: Diagnosis not present

## 2018-10-24 DIAGNOSIS — I422 Other hypertrophic cardiomyopathy: Secondary | ICD-10-CM | POA: Insufficient documentation

## 2018-10-24 LAB — BASIC METABOLIC PANEL
Anion gap: 11 (ref 5–15)
BUN: 14 mg/dL (ref 8–23)
CO2: 29 mmol/L (ref 22–32)
Calcium: 11.1 mg/dL — ABNORMAL HIGH (ref 8.9–10.3)
Chloride: 105 mmol/L (ref 98–111)
Creatinine, Ser: 1.14 mg/dL — ABNORMAL HIGH (ref 0.44–1.00)
GFR calc Af Amer: 55 mL/min — ABNORMAL LOW (ref >60)
GFR calc non Af Amer: 48 mL/min — ABNORMAL LOW (ref >60)
Glucose, Bld: 130 mg/dL — ABNORMAL HIGH (ref 70–99)
Potassium: 4.4 mmol/L (ref 3.5–5.1)
Sodium: 145 mmol/L (ref 135–145)

## 2018-10-24 MED ORDER — TRELEGY ELLIPTA 100-62.5-25 MCG/INH IN AEPB: 1.0000 | INHALATION_SPRAY | Freq: Every day | RESPIRATORY_TRACT | Status: DC

## 2018-10-24 NOTE — Progress Notes (Signed)
Paramedicine Encounter   Patient ID: Denise [name] , female,   DOB: 04-15-1945,73 y.o.,  MRN: [id number]   Met patient in clinic today with provider Amy.   Pt has taken her medications prior to this visit.  Her only complaint is sob per her norm.  Amy advised no medication changes.  Pt has an upcoming visit with Dr. Melvyn Novas (pulmonary) in a couple of weeks.  She's still taking prednisone, which she felt a lot better taking before.   [name] next visit will be with Dr. Aundra Dubin.  Time spent with patient 20 mins  No Med changes  [place], [place] 10/24/2018   ACTION: Home visit completed

## 2018-10-24 NOTE — Assessment & Plan Note (Signed)
Quit smoking 1989 - Spirometry 09/11/2016  FEV1 1.03 (52%)  Ratio 54 mild curvature, on no rx - 09/11/2016  After extensive coaching HFA effectiveness =    50% from baseline 10% > continue  symb 160 2bid but consider change to bevespi or stiolto  - 10/02/2017  After extensive coaching inhaler device,  effectiveness =    50% from a baseline near 0 > rechallenge with symbicort 160 x 2 bid x 2 weeks then re-group  - 10/05/2017    try stiolto x 2pffs x 2 week sample  - PFT's  11/01/2017  FEV1 0.95 (52 % ) ratio fev1/vc = 69%  p 3 % improvement from saba p ? Stiolto(not sure she used it)  prior to study with DLCO  30/30c % corrects to 59 % for alv volume  With air trapping pattern  confirmed on cxr as well - 05/02/2018   Try breo  - HRCT 03/19/18 c/w mod emphysema, no ILD , some dep scarring/ atx - 10/22/2018  After extensive coaching inhaler device,  effectiveness =    90% try change Breo to trelegy sample and pred x 6 d and return in 2 weeks  I had an extended discussion with the patient reviewing all relevant studies completed to date and  lasting 15 to 20 minutes of a 25 minute visit    I performed detailed device teaching using a teach back method which extended face to face time for this visit (see above)  Each maintenance medication was reviewed in detail including emphasizing most importantly the difference between maintenance and prns and under what circumstances the prns are to be triggered using an action plan format that is not reflected in the computer generated alphabetically organized AVS which I have not found useful in most complex patients, especially with respiratory illnesses  Please see AVS for specific instructions unique to this visit that I personally wrote and verbalized to the the pt in detail and then reviewed with pt  by my nurse highlighting any  changes in therapy recommended at today's visit to their plan of care.

## 2018-10-24 NOTE — Progress Notes (Signed)
Date:  10/24/2018   ID:  Enid Derry A. [place], [place] May 02, 1945, MRN [id number]    Provider location: [facility] Type of Visit: Established patient   PCP:  [name], NP  Cardiologist: Dr. Aundra Dubin Pulmonary: Dr Melvyn Novas  Chief Complaint: Shortness of breath   History of Present Illness: Denise [name] is a 73 y.o. female who has COPD on home oxygen, pulmonary HTN, suspected hypertrophic cardiomyopathy, HLD, DM2, Colon cancer s/p resection, hyperparathyroidism pending surgery, and HTN.   Admitted 9/15 - 09/24/17 for acute on chronic hypoxic respiratory failure. RHC at that time by Dr. Aundra Dubin showed significant pulmonary HTN. Thought to be most consistent with WHO group 3/COPD. Hospital course complicated by abdominal pain. GI work up with no clear cause.  Seen by Dr. Melvyn Novas 11/01/17. PFTs that day with significant COPD.   She was hospitalized in 3/20 with PNA/COPD exacerbation and CHF exacerbation.  She was treated with antibiotics and diuresed.  RHC/LHC showed no coronary disease, normal filling pressures, moderate pulmonary hypertension.  V/Q scan showed no chronic PE.  High resolution CT showed moderate COPD, no interstitial lung disease.  Echo was concerning for hypertrophic CMP, so cardiac MRI was done.  LV EF 38%, moderate focal basal septal hypertrophy, mid-wall LGE in a noncoronary pattern suggestive of HCM.   Today she returns for HF follow up. Last visit bidil was increased and lasix was started. Yesterday she was seen by Dr Melvyn Novas and started on prednisone. Overall feeling fair. Remains SOB with minimal exertion and with ongoing fatigue.  Denies PND/Orthopnea. Denies syncope/presyncope. On 4 liters continuous oxygen.  Appetite ok. No fever or chills. Weight at home 102-104  pounds. Taking all medications. Followed by HF Paramedicine.   Labs (3/20): RF elevated but anti-CCP negative, SPEP negative, anti-centromere Ab negative, anti-SCL 70  negative, ANA negative anti-Jo-1 negative, HIV negative.  Labs (4/20): K 3.5, creatinine 1.02, hgb 13, BNP 910, TSH normal Labs (5/20): K 3.1, creatinine 1.0 Labs (6/20): K 4.9, pro-BNP 840, K 4.9, creatinine 1.22  PMH: 1. Colon cancer: Surgery 1988.  2. COPD: Severe, on home oxygen.  - CT chest (3/20): Moderate emphysema.  3. Hyperlipidemia 4. HTN 5. Primary hyperparathyroidism 6. Pulmonary hypertension: Suspect there is a component of group 3 PH due to lung disease, but think also out of proportion so may be group 1 component.  - RHC (9/19): mean RA 3, PA 57/16 mean 32, mean PCWP 5, CI 1.61, PVR 10 WU.  - High resolution CT chest (3/20) with moderate emphysema, no ILD.  - Echo (3/20) with mild RV dilation/moderately decreased RV systolic function.  - LHC/RHC (3/20): Luminal irregularities in coronaries; mean RA 7, PA 59/22 mean 36, mean PCWP 9, CI 2, PVR 8.6 WU.  - HIV, anti-SCL-70, anti-centromere, CCP, ANA, anti-Jo-1 negative.  - V/Q scan (9/19): No evidence for chronic PE.  7. Suspected hypertrophic cardiomyopathy:  - Echo (3/20) with EF 45%, moderate asymmetric septal hypertrophy, mildly dilated RV with moderately decreased systolic function.  No mitral valve SAM.  - PYP scan (3/20): Not suggestive of transthyretin amyloidosis.  - Cardiac MRI (3/20): LV EF 38%, moderate focal basal septal hypertrophy, RV EF 30%, LGE pattern suggestive of HCM.    Current Outpatient Medications  Medication Sig Dispense Refill   albuterol (VENTOLIN HFA) 108 (90 Base) MCG/ACT inhaler INHALE 1-2 PUFFS INTO THE LUNGS EVERY 4 HOURS AS NEEDED FOR WHEEZING OR SHORTNESS OF BREATH. 6.7 g 5   aspirin EC  81 MG EC tablet Take 1 tablet (81 mg total) by mouth daily. 30 tablet 0   atorvastatin (LIPITOR) 10 MG tablet TAKE 1 TABLET BY MOUTH EVERYDAY AT BEDTIME 90 tablet 3   Blood Glucose Calibration (ACCU-CHEK AVIVA) SOLN USE AS INSTRUCTED TO TEST BLOOD SUGAR DAILY 1 each 2   Blood Glucose Monitoring Suppl  (ACCU-CHEK AVIVA PLUS) w/Device KIT Use as instructed to test blood sugar up to 3 times daily 1 kit 0   famotidine (PEPCID) 40 MG tablet TAKE 1 TABLET BY MOUTH TWICE A DAY 180 tablet 0   Fluticasone-Umeclidin-Vilant (TRELEGY ELLIPTA) 100-62.5-25 MCG/INH AEPB Inhale 1 puff into the lungs daily.     furosemide (LASIX) 40 MG tablet Take 1 tablet (40 mg total) by mouth daily. 30 tablet 11   glucose blood (ACCU-CHEK AVIVA PLUS) test strip Use as instructed to test blood sugar up to 3 times daily 300 each 1   isosorbide-hydrALAZINE (BIDIL) 20-37.5 MG tablet Take 2 tablets by mouth 3 (three) times daily. 180 tablet 3   KLOR-CON M10 10 MEQ tablet TAKE 2 TABLETS (20 MEQ TOTAL) BY MOUTH DAILY. FOR LOW POTASSIUM. 60 tablet 3   Lancets (ACCU-CHEK SOFT TOUCH) lancets Use as instructed to test blood sugar up to 3 times daily 300 each 1   losartan (COZAAR) 100 MG tablet Take 50 mg by mouth daily.     mirtazapine (REMERON) 15 MG tablet TAKE 1 TABLET BY MOUTH AT BEDTIME. FOR DEPRESSION AND APPETITE. 90 tablet 3   omeprazole (PRILOSEC) 20 MG capsule Take 20 mg by mouth daily.     OXYGEN Inhale 4 L into the lungs continuous.      Polyethylene Glycol 3350 (MIRALAX PO) Take 17 g by mouth as needed.     predniSONE (DELTASONE) 10 MG tablet Take 4 for three days 3 for three days 2 for three days 1 for three days and stop 30 tablet 0   spironolactone (ALDACTONE) 25 MG tablet Take 1 tablet (25 mg total) by mouth daily. Take at night 30 tablet 6   No current facility-administered medications for this encounter.     Allergies:   Patient has no known allergies.   Social History:  The patient  reports that she quit smoking about 31 years ago. Her smoking use included cigarettes. She has a 30.00 pack-year smoking history. She has never used smokeless tobacco. She reports that she does not drink alcohol or use drugs.   Family History:  The patient's family history includes Ovarian cancer in her mother.   ROS:   Please see the history of present illness.   All other systems are personally reviewed and negative.   Exam:   BP 140/88    Pulse 88    Wt 48.4 kg (106 lb 9.6 oz)    SpO2 90% Comment: on 4 liters   BMI 17.21 kg/m  General: Thin arrived in a wheel chair. SOB standing up to weigh.  HEENT: normal Neck: supple. JVP 6-7 . Carotids 2+ bilat; no bruits. No lymphadenopathy or thryomegaly appreciated. Cor: PMI nondisplaced. Regular rate & rhythm. No rubs, gallops or murmurs. Lungs: clear on 4 liters oxygen Abdomen: soft, nontender, nondistended. No hepatosplenomegaly. No bruits or masses. Good bowel sounds. Extremities: no cyanosis, clubbing, rash, R and LLE trace-1+edema Neuro: alert & orientedx3, cranial nerves grossly intact. moves all 4 extremities w/o difficulty. Affect pleasant  Recent Labs: 03/15/2018: ALT 24; B Natriuretic Peptide 1,914.0 03/19/2018: Magnesium 1.8 05/02/2018: Hemoglobin 13.0; Platelets 206.0; TSH 0.65 06/24/2018:  BUN 27; Creatinine, Ser 1.22; Potassium 4.9; Pro B Natriuretic peptide (BNP) 840.0; Sodium 142  Personally reviewed   Wt Readings from Last 3 Encounters:  10/24/18 48.4 kg (106 lb 9.6 oz)  10/23/18 49 kg (108 lb)  10/22/18 49 kg (108 lb)      ASSESSMENT AND PLAN:  1. COPD: She continues on home oxygen, follows with Dr Melvyn Novas.  - Continue regular followup with pulmonary.  - On 4 liters continuous oxygen. Started on prednisone 10/22. Hopefully this will help some.  2.Chronic systolic CHF: With prominent RV failure. Echo 3/20 with EF 45%, asymmetric septal hypertrophy, moderate RV systolic dysfunction. RHC/LHC was done in 3/20, showing no significant CAD and moderate pulmonary arterial hypertension.  Cardiac MRI showed LV EF 38%, moderate basal septal hypertrophy, RV EF 30%, mid-wall LGE in the basal anteroseptal wall and lesser extent in mid lateral wall.  Most concerning for hypertrophic CMP, less likely cardiac amyloidosis.  PYP scan was negative for evidence of  transthyretin amyloidosis and SPEP negative.  She does not have a known family history of HCM.   NYHA class IIIb symptoms. Volume status stable. Continue Lasix 40 mg daily - Continue bisoprolol 2.5 mg daily.  Would not increase with fatigue.  - Continue losartan 50 mg daily.   - Continue spironolactone 25 mg daily.   - Continue Bidil to 2 tabs tid 3. Hypertrophic cardiomyopathy: Imaging has been concerning for HCM without LVOT obstruction or SAM.  No family history of HCM or sudden death.   4. Pulmonary hypertension: RHC showed moderate pulmonary arterial hypertension with high PVR (8.6 WU).  V/Q scan (9/19) did not show chronic PE. CT chest high resolution showed no ILD, there was moderate emphysema.  Pulmonary hypertension seems to be out of proportion to degree of lung disease. Suspected mixed group 1 (idiopathic PAH) and group 3 PH.  Serologies sent and came back negative other than RF elevated (CCP negative). HIV negative. She has been on amlodipine in the past without improvement.  She was tried on Tyvaso (to limit V/Q mismatching in the setting of lung parenchymal disease), but she felt worse on this med it was stopped.  5. HTN:  Continue current regimen. BP at home stable.   Follow up in 8 weeks with Dr Aundra Dubin. Discussed with HF Paramedic during the visit. Asked to be notified if she has increased lower extremity edema and if so we can adjust diuretics or bring her in for follow up.    Jeanmarie Hubert, NP  10/24/2018  [facility] [address] [facility] [place] [address] [phone] (office) [phone] (fax)

## 2018-10-24 NOTE — Patient Instructions (Signed)
Lab work done today. We will notify you of any abnormal lab work. No news is good news!  Please follow up with the [facility] in 8-10 weeks with Dr. Aundra Dubin.  At the [facility], you and your health needs are our priority. As part of our continuing mission to provide you with exceptional heart care, we have created designated Provider Care Teams. These Care Teams include your primary Cardiologist (physician) and Advanced Practice Providers (APPs- Physician Assistants and Nurse Practitioners) who all work together to provide you with the care you need, when you need it.   You may see any of the following providers on your designated Care Team at your next follow up: [name] Dr Glori Bickers . Dr Loralie Champagne . Darrick Grinder, NP . Lyda Jester, PA   Please be sure to bring in all your medications bottles to every appointment.

## 2018-10-24 NOTE — Assessment & Plan Note (Signed)
See [place] 09/20/17  With  low CO ? Accuracy of PVR  - tyvaso d/c'd 10/03/18 by [facility] due to cough, improved   Reviewed notes, agree this is more likely WHO III, rx with 02/ keep up with vol status per cards

## 2018-10-24 NOTE — Assessment & Plan Note (Signed)
Assoc with ? Cor pulmonale dx 09/2017 so rx 02 1-2 lpm 24/7  - 10/17/2017  Walked RA x 3 laps @ 185 ft each stopped due to  End of study, nl pace,  desat to 87 on 3rd lap - 12/19/2017  Saturations on Room Air at Rest =91 % and while Ambulating = 87%  But  on  2 Liters of pulsed oxygen while Ambulating =93% so rec POC 2lpm walking / use at rest if sats under 90%  - HC03 05/02/2018  = 34  - HC03  06/24/18     = 26 c/w improving hypercarbic component   Adequate control on present rx, reviewed in detail with pt > no change in rx needed 2lpm hs/ 4lpm with activity but titrate to sats > 90% at all times/ reviewed with pt/ husband

## 2018-10-28 ENCOUNTER — Other Ambulatory Visit: Payer: Self-pay

## 2018-10-28 ENCOUNTER — Encounter (HOSPITAL_COMMUNITY): Payer: Self-pay | Admitting: Internal Medicine

## 2018-10-28 ENCOUNTER — Inpatient Hospital Stay (HOSPITAL_COMMUNITY)
Admission: EM | Admit: 2018-10-28 | Discharge: 2018-11-02 | DRG: 291 | Disposition: A | Discharge status: Home / Self Care | Payer: Medicare HMO | Attending: Internal Medicine | Admitting: Internal Medicine

## 2018-10-28 ENCOUNTER — Emergency Department (HOSPITAL_COMMUNITY): Payer: Medicare HMO

## 2018-10-28 DIAGNOSIS — I248 Other forms of acute ischemic heart disease: Secondary | ICD-10-CM | POA: Diagnosis not present

## 2018-10-28 DIAGNOSIS — K5790 Diverticulosis of intestine, part unspecified, without perforation or abscess without bleeding: Secondary | ICD-10-CM | POA: Diagnosis present

## 2018-10-28 DIAGNOSIS — E210 Primary hyperparathyroidism: Secondary | ICD-10-CM | POA: Diagnosis present

## 2018-10-28 DIAGNOSIS — I5023 Acute on chronic systolic (congestive) heart failure: Secondary | ICD-10-CM

## 2018-10-28 DIAGNOSIS — I272 Pulmonary hypertension, unspecified: Secondary | ICD-10-CM | POA: Diagnosis present

## 2018-10-28 DIAGNOSIS — Z7982 Long term (current) use of aspirin: Secondary | ICD-10-CM

## 2018-10-28 DIAGNOSIS — R7989 Other specified abnormal findings of blood chemistry: Secondary | ICD-10-CM

## 2018-10-28 DIAGNOSIS — Z681 Body mass index (BMI) 19 or less, adult: Secondary | ICD-10-CM | POA: Diagnosis not present

## 2018-10-28 DIAGNOSIS — E785 Hyperlipidemia, unspecified: Secondary | ICD-10-CM | POA: Diagnosis not present

## 2018-10-28 DIAGNOSIS — I2781 Cor pulmonale (chronic): Secondary | ICD-10-CM | POA: Diagnosis present

## 2018-10-28 DIAGNOSIS — J9612 Chronic respiratory failure with hypercapnia: Secondary | ICD-10-CM | POA: Diagnosis present

## 2018-10-28 DIAGNOSIS — E119 Type 2 diabetes mellitus without complications: Secondary | ICD-10-CM | POA: Diagnosis not present

## 2018-10-28 DIAGNOSIS — R0602 Shortness of breath: Secondary | ICD-10-CM | POA: Diagnosis not present

## 2018-10-28 DIAGNOSIS — N183 Chronic kidney disease, stage 3 unspecified: Secondary | ICD-10-CM | POA: Diagnosis present

## 2018-10-28 DIAGNOSIS — Z8041 Family history of malignant neoplasm of ovary: Secondary | ICD-10-CM

## 2018-10-28 DIAGNOSIS — J439 Emphysema, unspecified: Secondary | ICD-10-CM | POA: Diagnosis not present

## 2018-10-28 DIAGNOSIS — K219 Gastro-esophageal reflux disease without esophagitis: Secondary | ICD-10-CM | POA: Diagnosis present

## 2018-10-28 DIAGNOSIS — E872 Acidosis, unspecified: Secondary | ICD-10-CM | POA: Diagnosis present

## 2018-10-28 DIAGNOSIS — E559 Vitamin D deficiency, unspecified: Secondary | ICD-10-CM | POA: Diagnosis present

## 2018-10-28 DIAGNOSIS — Z85038 Personal history of other malignant neoplasm of large intestine: Secondary | ICD-10-CM | POA: Diagnosis not present

## 2018-10-28 DIAGNOSIS — Z87891 Personal history of nicotine dependence: Secondary | ICD-10-CM

## 2018-10-28 DIAGNOSIS — E43 Unspecified severe protein-calorie malnutrition: Secondary | ICD-10-CM | POA: Diagnosis not present

## 2018-10-28 DIAGNOSIS — I2721 Secondary pulmonary arterial hypertension: Secondary | ICD-10-CM | POA: Diagnosis present

## 2018-10-28 DIAGNOSIS — F419 Anxiety disorder, unspecified: Secondary | ICD-10-CM | POA: Diagnosis present

## 2018-10-28 DIAGNOSIS — R778 Other specified abnormalities of plasma proteins: Secondary | ICD-10-CM

## 2018-10-28 DIAGNOSIS — J432 Centrilobular emphysema: Secondary | ICD-10-CM | POA: Diagnosis present

## 2018-10-28 DIAGNOSIS — I509 Heart failure, unspecified: Secondary | ICD-10-CM | POA: Diagnosis not present

## 2018-10-28 DIAGNOSIS — I10 Essential (primary) hypertension: Secondary | ICD-10-CM

## 2018-10-28 DIAGNOSIS — J441 Chronic obstructive pulmonary disease with (acute) exacerbation: Secondary | ICD-10-CM | POA: Diagnosis present

## 2018-10-28 DIAGNOSIS — J9622 Acute and chronic respiratory failure with hypercapnia: Secondary | ICD-10-CM | POA: Diagnosis not present

## 2018-10-28 DIAGNOSIS — I422 Other hypertrophic cardiomyopathy: Secondary | ICD-10-CM | POA: Diagnosis present

## 2018-10-28 DIAGNOSIS — J449 Chronic obstructive pulmonary disease, unspecified: Secondary | ICD-10-CM | POA: Diagnosis not present

## 2018-10-28 DIAGNOSIS — R069 Unspecified abnormalities of breathing: Secondary | ICD-10-CM | POA: Diagnosis not present

## 2018-10-28 DIAGNOSIS — Z9981 Dependence on supplemental oxygen: Secondary | ICD-10-CM | POA: Diagnosis not present

## 2018-10-28 DIAGNOSIS — I5033 Acute on chronic diastolic (congestive) heart failure: Secondary | ICD-10-CM | POA: Diagnosis not present

## 2018-10-28 DIAGNOSIS — I110 Hypertensive heart disease with heart failure: Secondary | ICD-10-CM | POA: Diagnosis not present

## 2018-10-28 DIAGNOSIS — N179 Acute kidney failure, unspecified: Secondary | ICD-10-CM

## 2018-10-28 DIAGNOSIS — J9621 Acute and chronic respiratory failure with hypoxia: Secondary | ICD-10-CM | POA: Diagnosis present

## 2018-10-28 DIAGNOSIS — R0689 Other abnormalities of breathing: Secondary | ICD-10-CM | POA: Diagnosis not present

## 2018-10-28 DIAGNOSIS — F329 Major depressive disorder, single episode, unspecified: Secondary | ICD-10-CM | POA: Diagnosis present

## 2018-10-28 DIAGNOSIS — I130 Hypertensive heart and chronic kidney disease with heart failure and stage 1 through stage 4 chronic kidney disease, or unspecified chronic kidney disease: Principal | ICD-10-CM | POA: Diagnosis present

## 2018-10-28 DIAGNOSIS — E1165 Type 2 diabetes mellitus with hyperglycemia: Secondary | ICD-10-CM | POA: Diagnosis not present

## 2018-10-28 DIAGNOSIS — Z7951 Long term (current) use of inhaled steroids: Secondary | ICD-10-CM | POA: Diagnosis not present

## 2018-10-28 DIAGNOSIS — I5043 Acute on chronic combined systolic (congestive) and diastolic (congestive) heart failure: Secondary | ICD-10-CM | POA: Diagnosis present

## 2018-10-28 DIAGNOSIS — Z7984 Long term (current) use of oral hypoglycemic drugs: Secondary | ICD-10-CM

## 2018-10-28 DIAGNOSIS — Z20828 Contact with and (suspected) exposure to other viral communicable diseases: Secondary | ICD-10-CM | POA: Diagnosis present

## 2018-10-28 DIAGNOSIS — R079 Chest pain, unspecified: Secondary | ICD-10-CM | POA: Diagnosis not present

## 2018-10-28 DIAGNOSIS — J80 Acute respiratory distress syndrome: Secondary | ICD-10-CM | POA: Diagnosis not present

## 2018-10-28 DIAGNOSIS — E1122 Type 2 diabetes mellitus with diabetic chronic kidney disease: Secondary | ICD-10-CM | POA: Diagnosis present

## 2018-10-28 DIAGNOSIS — J9611 Chronic respiratory failure with hypoxia: Secondary | ICD-10-CM | POA: Diagnosis not present

## 2018-10-28 DIAGNOSIS — I491 Atrial premature depolarization: Secondary | ICD-10-CM | POA: Diagnosis not present

## 2018-10-28 DIAGNOSIS — T380X5A Adverse effect of glucocorticoids and synthetic analogues, initial encounter: Secondary | ICD-10-CM | POA: Diagnosis not present

## 2018-10-28 DIAGNOSIS — R600 Localized edema: Secondary | ICD-10-CM | POA: Diagnosis not present

## 2018-10-28 HISTORY — DX: Gastro-esophageal reflux disease without esophagitis: K21.9

## 2018-10-28 HISTORY — DX: Other specified abnormal findings of blood chemistry: R79.89

## 2018-10-28 HISTORY — DX: Other specified abnormalities of plasma proteins: R77.8

## 2018-10-28 HISTORY — DX: Chronic obstructive pulmonary disease with (acute) exacerbation: J44.1

## 2018-10-28 HISTORY — DX: Acidosis, unspecified: E87.20

## 2018-10-28 HISTORY — DX: Acute and chronic respiratory failure with hypercapnia: J96.22

## 2018-10-28 HISTORY — DX: Acidosis: E87.2

## 2018-10-28 LAB — COMPREHENSIVE METABOLIC PANEL
ALT: 24 U/L (ref 0–44)
AST: 32 U/L (ref 15–41)
Albumin: 3.6 g/dL (ref 3.5–5.0)
Alkaline Phosphatase: 83 U/L (ref 38–126)
Anion gap: 11 (ref 5–15)
BUN: 13 mg/dL (ref 8–23)
CO2: 27 mmol/L (ref 22–32)
Calcium: 10.6 mg/dL — ABNORMAL HIGH (ref 8.9–10.3)
Chloride: 102 mmol/L (ref 98–111)
Creatinine, Ser: 0.98 mg/dL (ref 0.44–1.00)
GFR calc Af Amer: >60 mL/min (ref >60)
GFR calc non Af Amer: 57 mL/min — ABNORMAL LOW (ref >60)
Glucose, Bld: 175 mg/dL — ABNORMAL HIGH (ref 70–99)
Potassium: 3.6 mmol/L (ref 3.5–5.1)
Sodium: 140 mmol/L (ref 135–145)
Total Bilirubin: 0.7 mg/dL (ref 0.3–1.2)
Total Protein: 6 g/dL — ABNORMAL LOW (ref 6.5–8.1)

## 2018-10-28 LAB — POCT I-STAT EG7
Acid-Base Excess: 7 mmol/L — ABNORMAL HIGH (ref 0.0–2.0)
Bicarbonate: 33.3 mmol/L — ABNORMAL HIGH (ref 20.0–28.0)
Calcium, Ion: 1.37 mmol/L (ref 1.15–1.40)
HCT: 41 % (ref 36.0–46.0)
Hemoglobin: 13.9 g/dL (ref 12.0–15.0)
O2 Saturation: 96 %
Potassium: 3.6 mmol/L (ref 3.5–5.1)
Sodium: 140 mmol/L (ref 135–145)
TCO2: 35 mmol/L — ABNORMAL HIGH (ref 22–32)
pCO2, Ven: 51.9 mmHg (ref 44.0–60.0)
pH, Ven: 7.416 (ref 7.250–7.430)
pO2, Ven: 86 mmHg — ABNORMAL HIGH (ref 32.0–45.0)

## 2018-10-28 LAB — CBC WITH DIFFERENTIAL/PLATELET
Abs Immature Granulocytes: 0.03 10*3/uL (ref 0.00–0.07)
Basophils Absolute: 0.1 10*3/uL (ref 0.0–0.1)
Basophils Relative: 1 %
Eosinophils Absolute: 0.1 10*3/uL (ref 0.0–0.5)
Eosinophils Relative: 1 %
HCT: 42.1 % (ref 36.0–46.0)
Hemoglobin: 13 g/dL (ref 12.0–15.0)
Immature Granulocytes: 0 %
Lymphocytes Relative: 7 %
Lymphs Abs: 0.5 10*3/uL — ABNORMAL LOW (ref 0.7–4.0)
MCH: 26.6 pg (ref 26.0–34.0)
MCHC: 30.9 g/dL (ref 30.0–36.0)
MCV: 86.1 fL (ref 80.0–100.0)
Monocytes Absolute: 0.2 10*3/uL (ref 0.1–1.0)
Monocytes Relative: 4 %
Neutro Abs: 6 10*3/uL (ref 1.7–7.7)
Neutrophils Relative %: 87 %
Platelets: 208 10*3/uL (ref 150–400)
RBC: 4.89 MIL/uL (ref 3.87–5.11)
RDW: 15.3 % (ref 11.5–15.5)
WBC: 6.9 10*3/uL (ref 4.0–10.5)
nRBC: 0 % (ref 0.0–0.2)

## 2018-10-28 LAB — TROPONIN I (HIGH SENSITIVITY)
Troponin I (High Sensitivity): 221 ng/L (ref <18)
Troponin I (High Sensitivity): 229 ng/L (ref <18)

## 2018-10-28 LAB — BRAIN NATRIURETIC PEPTIDE: B Natriuretic Peptide: 1586.2 pg/mL — ABNORMAL HIGH (ref 0.0–100.0)

## 2018-10-28 LAB — LACTIC ACID, PLASMA
Lactic Acid, Venous: 2.2 mmol/L (ref 0.5–1.9)
Lactic Acid, Venous: 2.3 mmol/L (ref 0.5–1.9)

## 2018-10-28 MED ORDER — PANTOPRAZOLE SODIUM 40 MG PO TBEC
40.0000 mg | DELAYED_RELEASE_TABLET | Freq: Every day | ORAL | Status: DC
  Administered 2018-10-29 – 2018-11-02 (×5): 40 mg via ORAL
  Filled 2018-10-28 (×5): qty 1

## 2018-10-28 MED ORDER — IPRATROPIUM BROMIDE HFA 17 MCG/ACT IN AERS
2.0000 | INHALATION_SPRAY | RESPIRATORY_TRACT | Status: DC
  Administered 2018-10-29: 2 via RESPIRATORY_TRACT

## 2018-10-28 MED ORDER — DM-GUAIFENESIN ER 30-600 MG PO TB12: 1.0000 | ORAL_TABLET | Freq: Two times a day (BID) | ORAL | Status: DC | PRN

## 2018-10-28 MED ORDER — ENOXAPARIN SODIUM 40 MG/0.4ML ~~LOC~~ SOLN
40.0000 mg | SUBCUTANEOUS | Status: DC
  Administered 2018-10-28: 40 mg via SUBCUTANEOUS
  Filled 2018-10-28: qty 0.4

## 2018-10-28 MED ORDER — ASPIRIN EC 81 MG PO TBEC: 81.0000 mg | DELAYED_RELEASE_TABLET | Freq: Every day | ORAL | Status: DC

## 2018-10-28 MED ORDER — IPRATROPIUM BROMIDE HFA 17 MCG/ACT IN AERS
2.0000 | INHALATION_SPRAY | Freq: Once | RESPIRATORY_TRACT | Status: AC
  Administered 2018-10-28: 21:00:00 2 via RESPIRATORY_TRACT
  Filled 2018-10-28: qty 12.9

## 2018-10-28 MED ORDER — ALBUTEROL SULFATE HFA 108 (90 BASE) MCG/ACT IN AERS
2.0000 | INHALATION_SPRAY | Freq: Once | RESPIRATORY_TRACT | Status: AC
  Administered 2018-10-28: 2 via RESPIRATORY_TRACT

## 2018-10-28 MED ORDER — ATORVASTATIN CALCIUM 10 MG PO TABS
10.0000 mg | ORAL_TABLET | Freq: Every day | ORAL | Status: DC
  Administered 2018-10-29 – 2018-11-01 (×4): 10 mg via ORAL
  Filled 2018-10-28 (×4): qty 1

## 2018-10-28 MED ORDER — MIRTAZAPINE 7.5 MG PO TABS
15.0000 mg | ORAL_TABLET | Freq: Every day | ORAL | Status: DC
  Administered 2018-10-29 – 2018-11-01 (×5): 15 mg via ORAL
  Filled 2018-10-28 (×2): qty 2
  Filled 2018-10-28 (×2): qty 1
  Filled 2018-10-28 (×2): qty 2

## 2018-10-28 MED ORDER — ACETAMINOPHEN 325 MG PO TABS: 650.0000 mg | ORAL_TABLET | ORAL | Status: DC | PRN

## 2018-10-28 MED ORDER — HYDRALAZINE HCL 25 MG PO TABS: 25.0000 mg | ORAL_TABLET | Freq: Three times a day (TID) | ORAL | Status: DC | PRN

## 2018-10-28 MED ORDER — FUROSEMIDE 10 MG/ML IJ SOLN
40.0000 mg | Freq: Two times a day (BID) | INTRAMUSCULAR | Status: DC
  Administered 2018-10-28 – 2018-10-29 (×2): 40 mg via INTRAVENOUS
  Filled 2018-10-28 (×2): qty 4

## 2018-10-28 MED ORDER — SODIUM CHLORIDE 0.9% FLUSH: 3.0000 mL | INTRAVENOUS | Status: DC | PRN

## 2018-10-28 MED ORDER — ALBUTEROL SULFATE HFA 108 (90 BASE) MCG/ACT IN AERS
2.0000 | INHALATION_SPRAY | RESPIRATORY_TRACT | Status: DC | PRN
  Filled 2018-10-28: qty 6.7

## 2018-10-28 MED ORDER — DEXAMETHASONE SODIUM PHOSPHATE 10 MG/ML IJ SOLN
20.0000 mg | Freq: Once | INTRAMUSCULAR | Status: AC
  Administered 2018-10-28: 20 mg via INTRAVENOUS
  Filled 2018-10-28: qty 2

## 2018-10-28 MED ORDER — POLYETHYLENE GLYCOL 3350 17 G PO PACK: 17.0000 g | PACK | Freq: Every day | ORAL | Status: DC | PRN

## 2018-10-28 MED ORDER — SODIUM CHLORIDE 0.9% FLUSH
3.0000 mL | Freq: Two times a day (BID) | INTRAVENOUS | Status: DC
  Administered 2018-10-28 – 2018-11-02 (×7): 3 mL via INTRAVENOUS

## 2018-10-28 MED ORDER — ASPIRIN EC 81 MG PO TBEC
81.0000 mg | DELAYED_RELEASE_TABLET | Freq: Every day | ORAL | Status: DC
  Administered 2018-10-29 – 2018-11-02 (×5): 81 mg via ORAL
  Filled 2018-10-28 (×5): qty 1

## 2018-10-28 MED ORDER — ALBUTEROL SULFATE HFA 108 (90 BASE) MCG/ACT IN AERS
4.0000 | INHALATION_SPRAY | Freq: Once | RESPIRATORY_TRACT | Status: AC
  Administered 2018-10-28: 21:00:00 4 via RESPIRATORY_TRACT
  Filled 2018-10-28: qty 6.7

## 2018-10-28 MED ORDER — LOSARTAN POTASSIUM 50 MG PO TABS
50.0000 mg | ORAL_TABLET | Freq: Every day | ORAL | Status: DC
  Administered 2018-10-29: 09:00:00 50 mg via ORAL
  Filled 2018-10-28: qty 1

## 2018-10-28 MED ORDER — METHYLPREDNISOLONE SODIUM SUCC 40 MG IJ SOLR
40.0000 mg | Freq: Two times a day (BID) | INTRAMUSCULAR | Status: DC
  Administered 2018-10-29 – 2018-10-30 (×3): 40 mg via INTRAVENOUS
  Filled 2018-10-28 (×3): qty 1

## 2018-10-28 MED ORDER — IPRATROPIUM BROMIDE HFA 17 MCG/ACT IN AERS
2.0000 | INHALATION_SPRAY | Freq: Once | RESPIRATORY_TRACT | Status: AC
  Administered 2018-10-28: 2 via RESPIRATORY_TRACT
  Filled 2018-10-28: qty 12.9

## 2018-10-28 MED ORDER — ALBUTEROL SULFATE HFA 108 (90 BASE) MCG/ACT IN AERS
4.0000 | INHALATION_SPRAY | Freq: Once | RESPIRATORY_TRACT | Status: AC
  Administered 2018-10-28: 22:00:00 4 via RESPIRATORY_TRACT

## 2018-10-28 MED ORDER — METHYLPREDNISOLONE SODIUM SUCC 40 MG IJ SOLR: 40.0000 mg | Freq: Two times a day (BID) | INTRAMUSCULAR | Status: DC

## 2018-10-28 MED ORDER — ISOSORB DINITRATE-HYDRALAZINE 20-37.5 MG PO TABS
2.0000 | ORAL_TABLET | Freq: Three times a day (TID) | ORAL | Status: DC
  Administered 2018-10-29 – 2018-11-02 (×14): 2 via ORAL
  Filled 2018-10-28 (×17): qty 2

## 2018-10-28 MED ORDER — SODIUM CHLORIDE 0.9 % IV SOLN: 250.0000 mL | INTRAVENOUS | Status: DC | PRN

## 2018-10-28 MED ORDER — FLUTICASONE-UMECLIDIN-VILANT 100-62.5-25 MCG/INH IN AEPB: 1.0000 | INHALATION_SPRAY | Freq: Every day | RESPIRATORY_TRACT | Status: DC

## 2018-10-28 NOTE — ED Triage Notes (Signed)
Pt BIB [facility] for shortness of breath worse with exertion. Pt reports she is normally short of breath but it has gotten worse this afternoon/evening. Pt 92% on her 4L that she always wears at home and placed on NRB by EMS. EMS reports 100% on the NRB with diminished lung sounds. Pt using accessory muscles upon ED arrival.

## 2018-10-28 NOTE — H&P (Signed)
History and Physical    Denise [name] Plan MBT:[id number] DOB: 01/05/1945 DOA: 10/28/2018  Referring MD/NP/PA:   PCP: [name], NP   Patient coming from:  The patient is coming from home.  At baseline, pt is independent for most of ADL.        Chief Complaint: SOB  HPI: Denise [name] is a 73 y.o. female with medical history significant of HTN, HLD, former smoker, diet-controled DM, COPD, chronic respiratory failure on 4L nasal cannula oxygen at home, cor pulmonale, GERD, depression, primary hyperparathyroidism, sCHF with EF 45%, colon cancer, who presents with shortness of breath.  Patient states that has been having shortness of breath, which has been progressively worsening in the past several days.  It is worse on exertion. She has dry cough. Denies chest pain.  No fever or chills.  She has oxygen desaturation to 92% on her home level 4 L nasal cannula oxygen, requiring nonbreather in ED.  She is using accessory muscle for breathing in ED.  Denies nausea, vomiting, diarrhea, abdominal pain.  She has increased urinary frequency (she is on diuretics), but no dysuria or burning on urination.  She has bilateral ankle edema.  She has a little weight gain recently, approximately 1 to 2 pounds per her husband.  No unilateral weakness.  ED Course: pt was found to have BNP 1582, troponin 221, lactic acid 2.2, pending COVID-19 test, electrolytes renal function okay, temperature normal, blood pressure 144/104, heart rate 86, tachypnea, chest x-ray showed cardiomegaly, vascular congestion and emphysema.  Patient is admitted to telemetry bed as inpatient.  Review of Systems:   General: no fevers, chills, has body weight gain, has fatigue HEENT: no blurry vision, hearing changes or sore throat Respiratory: has dyspnea, coughing, no wheezing CV: no chest pain, no palpitations GI: no nausea, vomiting, abdominal pain, diarrhea, constipation GU: no dysuria, burning on urination, has  increased urinary frequency, no hematuria  Ext: has trace ankle edema Neuro: no unilateral weakness, numbness, or tingling, no vision change or hearing loss Skin: no rash, no skin tear. MSK: No muscle spasm, no deformity, no limitation of range of movement in spin Heme: No easy bruising.  Travel history: No recent long distant travel.  Allergy: No Known Allergies  Past Medical History:  Diagnosis Date  . Altered mental status   . Anxiety and depression   . Chronic respiratory failure with hypoxia ([place]) 10/02/2017   Assoc with ? Cor pulmonale dx 09/2017 so rx 02 1-2 lpm 24/7  - 10/17/2017  Walked RA x 3 laps @ 185 ft each stopped due to  End of study, nl pace,  desat to 87 on 3rd lap - 12/19/2017  Saturations on Room Air at Rest =91 % and while Ambulating = 87%  But  on  2 Liters of pulsed oxygen while Ambulating =93% so rec POC 2lpm walking / use at rest if sats under 90%      . Colon cancer ([place]) 1988   Resected  . COPD (chronic obstructive pulmonary disease) ([place])   . Diabetes mellitus without complication ([name])    diet controlled- no meds. per pt  . Diarrhea 04/02/2018  . Diverticulosis   . Hyperlipidemia   . Hypertension   . Insomnia   . Primary hyperparathyroidism ([place])   . SBO (small bowel obstruction) ([place])   . Tubular adenoma of rectum 02/23/2013   low grade  . Vitamin D deficiency     Past Surgical History:  Procedure Laterality Date  .  ABDOMINAL HYSTERECTOMY  08/2015  . BREAST BIOPSY Left   . BREAST EXCISIONAL BIOPSY Right   . COLON RESECTION  1980's  . COLONOSCOPY    . ENDOMETRIAL BIOPSY  2009   negative  . INCONTINENCE SURGERY  2017  . RIGHT HEART CATH N/A 09/20/2017   Procedure: RIGHT HEART CATH;  Surgeon: Larey Dresser, MD;  Location: [facility];  Service: Cardiovascular;  Laterality: N/A;  . RIGHT/LEFT HEART CATH AND CORONARY ANGIOGRAPHY N/A 03/19/2018   Procedure: RIGHT/LEFT HEART CATH AND CORONARY ANGIOGRAPHY;  Surgeon: Larey Dresser, MD;   Location: [facility];  Service: Cardiovascular;  Laterality: N/A;    Social History:  reports that she quit smoking about 31 years ago. Her smoking use included cigarettes. She has a 30.00 pack-year smoking history. She has never used smokeless tobacco. She reports that she does not drink alcohol or use drugs.  Family History:  Family History  Problem Relation Age of Onset  . Ovarian cancer Mother   . Breast cancer Neg Hx   . Hyperparathyroidism Neg Hx   . Colon cancer Neg Hx   . Esophageal cancer Neg Hx   . Stomach cancer Neg Hx      Prior to Admission medications   Medication Sig Start Date End Date Taking? Authorizing Provider  albuterol (VENTOLIN HFA) 108 (90 Base) MCG/ACT inhaler INHALE 1-2 PUFFS INTO THE LUNGS EVERY 4 HOURS AS NEEDED FOR WHEEZING OR SHORTNESS OF BREATH. Patient taking differently: Inhale 1-2 puffs into the lungs every 4 (four) hours as needed for wheezing or shortness of breath. INHALE 1-2 PUFFS INTO THE LUNGS EVERY 4 HOURS AS NEEDED FOR WHEEZING OR SHORTNESS OF BREATH. 04/30/18  Yes Parrett, Tammy S, NP  aspirin EC 81 MG EC tablet Take 1 tablet (81 mg total) by mouth daily. 03/22/18  Yes Mikhail, Maryann, DO  atorvastatin (LIPITOR) 10 MG tablet TAKE 1 TABLET BY MOUTH EVERYDAY AT BEDTIME Patient taking differently: Take 10 mg by mouth at bedtime.  07/22/18  Yes Baity, Coralie Keens, NP  Blood Glucose Calibration (ACCU-CHEK AVIVA) SOLN USE AS INSTRUCTED TO TEST BLOOD SUGAR DAILY Patient taking differently: 1 each by Other route daily.  05/28/18  Yes [name], NP  Blood Glucose Monitoring Suppl (ACCU-CHEK AVIVA PLUS) w/Device KIT Use as instructed to test blood sugar up to 3 times daily Patient taking differently: 1 each by Other route 3 (three) times daily.  05/28/18  Yes [name], NP  Fluticasone-Umeclidin-Vilant (TRELEGY ELLIPTA) 100-62.5-25 MCG/INH AEPB Inhale 1 puff into the lungs daily. 10/24/18  Yes Tanda Rockers, MD  furosemide (LASIX) 40  MG tablet Take 1 tablet (40 mg total) by mouth daily. 10/03/18 10/03/19 Yes Larey Dresser, MD  glucose blood (ACCU-CHEK AVIVA PLUS) test strip Use as instructed to test blood sugar up to 3 times daily 05/28/18  Yes [name], NP  isosorbide-hydrALAZINE (BIDIL) 20-37.5 MG tablet Take 2 tablets by mouth 3 (three) times daily. 10/03/18  Yes Larey Dresser, MD  KLOR-CON M10 10 MEQ tablet TAKE 2 TABLETS (20 MEQ TOTAL) BY MOUTH DAILY. FOR LOW POTASSIUM. Patient taking differently: Take 10 mEq by mouth daily.  09/20/18  Yes Larey Dresser, MD  Lancets (ACCU-CHEK SOFT TOUCH) lancets Use as instructed to test blood sugar up to 3 times daily Patient taking differently: 1 each by Other route 3 (three) times daily.  05/28/18  Yes [name], NP  losartan (COZAAR) 100 MG tablet Take 50  mg by mouth daily.   Yes [provider]  mirtazapine (REMERON) 15 MG tablet TAKE 1 TABLET BY MOUTH AT BEDTIME. FOR DEPRESSION AND APPETITE. Patient taking differently: Take 15 mg by mouth at bedtime.  09/25/18  Yes [name], NP  predniSONE (DELTASONE) 10 MG tablet Take 4 for three days 3 for three days 2 for three days 1 for three days and stop 10/22/18  Yes Tanda Rockers, MD  spironolactone (ALDACTONE) 25 MG tablet Take 1 tablet (25 mg total) by mouth daily. Take at night Patient taking differently: Take 25 mg by mouth every evening.  08/29/18  Yes Larey Dresser, MD  famotidine (PEPCID) 40 MG tablet TAKE 1 TABLET BY MOUTH TWICE A DAY Patient taking differently: Take 40 mg by mouth 2 (two) times daily.  07/11/18   Tanda Rockers, MD  omeprazole (PRILOSEC) 20 MG capsule Take 20 mg by mouth daily.    [provider]  OXYGEN Inhale 4 L into the lungs continuous.     [provider]  Polyethylene Glycol 3350 (MIRALAX PO) Take 17 g by mouth as needed.    [provider]  glipiZIDE (GLUCOTROL) 5 MG tablet Take 0.5 tablets (2.5 mg total) by mouth daily before breakfast.  03/22/18 04/26/18  Cristal Ford, DO    Physical Exam: Vitals:   10/28/18 1930 10/28/18 2045 10/28/18 2230 10/28/18 2245  BP:  (!) 144/104 123/72 123/81  Pulse:  86 65 83  Resp:  (!) 24 (!) 29 (!) 26  Temp:      TempSrc:      SpO2:  92% 95% 95%  Weight: 48.4 kg     Height: _0  (1.676 m)      General: Not in acute distress HEENT:       Eyes: PERRL, EOMI, no scleral icterus.       ENT: No discharge from the ears and nose, no pharynx injection, no tonsillar enlargement.        Neck: has positve JVD, no bruit, no mass felt. Heme: No neck lymph node enlargement. Cardiac: S1/S2, RRR, No murmurs, No gallops or rubs. Respiratory: Has decreased air movement heard bilaterally. GI: Soft, nondistended, nontender, no rebound pain, no organomegaly, BS present. GU: No hematuria Ext: has trace ankle edema bilaterally. 2+DP/PT pulse bilaterally. Musculoskeletal: No joint deformities, No joint redness or warmth, no limitation of ROM in spin. Skin: No rashes.  Neuro: Alert, oriented X3, cranial nerves II-XII grossly intact, moves all extremities normally.  Psych: Patient is not psychotic, no suicidal or hemocidal ideation.   Labs on Admission: I have personally reviewed following labs and imaging studies  CBC: Recent Labs  Lab 10/28/18 2044 10/28/18 2057  WBC 6.9  --   NEUTROABS 6.0  --   HGB 13.0 13.9  HCT 42.1 41.0  MCV 86.1  --   PLT 208  --    Basic Metabolic Panel: Recent Labs  Lab 10/24/18 1206 10/28/18 2044 10/28/18 2057  NA 145 140 140  K 4.4 3.6 3.6  CL 105 102  --   CO2 29 27  --   GLUCOSE 130* 175*  --   BUN 14 13  --   CREATININE 1.14* 0.98  --   CALCIUM 11.1* 10.6*  --    GFR: Estimated Creatinine Clearance: 39.1 mL/min (by C-G formula based on SCr of 0.98 mg/dL). Liver Function Tests: Recent Labs  Lab 10/28/18 2044  AST 32  ALT 24  ALKPHOS 83  BILITOT  0.7  PROT 6.0*  ALBUMIN 3.6   No results for input(s): LIPASE, AMYLASE in the last 168 hours.  No results for input(s): AMMONIA in the last 168 hours. Coagulation Profile: No results for input(s): INR, PROTIME in the last 168 hours. Cardiac Enzymes: No results for input(s): CKTOTAL, CKMB, CKMBINDEX, TROPONINI in the last 168 hours. BNP (last 3 results) Recent Labs    03/01/18 1218 05/02/18 1249 06/24/18 1111  PROBNP 1,336.0* 910.0* 840.0*   HbA1C: No results for input(s): HGBA1C in the last 72 hours. CBG: No results for input(s): GLUCAP in the last 168 hours. Lipid Profile: No results for input(s): CHOL, HDL, LDLCALC, TRIG, CHOLHDL, LDLDIRECT in the last 72 hours. Thyroid Function Tests: No results for input(s): TSH, T4TOTAL, FREET4, T3FREE, THYROIDAB in the last 72 hours. Anemia Panel: No results for input(s): VITAMINB12, FOLATE, FERRITIN, TIBC, IRON, RETICCTPCT in the last 72 hours. Urine analysis:    Component Value Date/Time   COLORURINE YELLOW 09/16/2017 2002   APPEARANCEUR CLEAR 09/16/2017 2002   LABSPEC 1.010 09/16/2017 2002   PHURINE 6.0 09/16/2017 2002   GLUCOSEU 50 (A) 09/16/2017 2002   HGBUR SMALL (A) 09/16/2017 2002   BILIRUBINUR Negative 09/28/2017 1604   KETONESUR 5 (A) 09/16/2017 2002   PROTEINUR Negative 09/28/2017 1604   PROTEINUR NEGATIVE 09/16/2017 2002   UROBILINOGEN 0.2 09/28/2017 1604   NITRITE Negative 09/28/2017 1604   NITRITE NEGATIVE 09/16/2017 2002   LEUKOCYTESUR Negative 09/28/2017 1604   Sepsis Labs: _0 (procalcitonin:4,lacticidven:4) )No results found for this or any previous visit (from the past 240 hour(s)).   Radiological Exams on Admission: Dg Chest Portable 1 View  Result Date: 10/28/2018 CLINICAL DATA:  Chest pain and shortness of breath EXAM: PORTABLE CHEST 1 VIEW COMPARISON:  03/15/2018 FINDINGS: Cardiomegaly with central vascular congestion. Background emphysema suspected. No definite focal pneumonia, collapse or consolidation. Negative for edema, effusion or pneumothorax. Trachea midline. Aorta atherosclerotic.  Degenerative changes of the spine with associated scoliosis. IMPRESSION: Cardiomegaly with vascular congestion Emphysema Atherosclerosis No significant interval change or superimposed acute process Electronically Signed   By: Jerilynn Mages.  Shick M.D.   On: 10/28/2018 20:15     EKG: Independently reviewed.  Sinus rhythm, QTC 594, frequent PVC, nonspecific T wave change.   Assessment/Plan Principal Problem:   Chronic respiratory failure with hypoxia and hypercapnia ([name]) Active Problems:   Hyperlipidemia   Hyperparathyroidism, primary ([place])   Pulmonary hypertension ([name]) c/w cor pulmonale   COPD exacerbation ([name])   Acute on chronic systolic CHF (congestive heart failure) ([name])   Elevated troponin   Lactic acidosis   GERD (gastroesophageal reflux disease)   Diabetes mellitus without complication-diet controled   Chronic respiratory failure with hypoxia and hypercapnia ([place]): VBG with pH 7.416, CO2 51.9, O2 86.  Likely due to combination of CHF exacerbation given elevated BNP 1586 and COPD exacerbation.  -admit to SDU as inpt -start BiAPA -check ABG -Nasal cannula oxygen or nonrebreather to maintain oxygen saturation above 93% -Continue bronchodilators and give steroids -IV Lasix -Treat COPD and CHF exacerbation see below  Acute on chronic systolic CHF in the setting of pulmonary hypertension ([name]) c/w cor pulmonale: 2D echo on 03/16/2018 showed EF of 45-50%.  Patient has elevated BNP 1586 and positive JVD. Chest x-ray showed vascular congestion and cardiomegaly, clinically consistent with CHF exacerbation. -Lasix 40 mg bid by IV -continue Bidil and ASA -2d echo -Daily weights -strict I/O's -Low salt diet -Fluid restriction  Elevated troponin: trop 221. No CP.  Possibly due to demand ischemia secondary  to CHF and COPD exacerbation. -  aspirin, lipitor  - Risk factor stratification: will check FLP and A1C  - check UDS -trend trop -f/u 2d echo.  Hyperlipidemia: -Lipitor  Hx of  Hyperparathyroidism, primary ([place]): Ca 10.6 today. -f/u BMP   Lactic acidosis: lactic acid 2.2. No fever or leukocytosis.  Clinically no sepsis.  Likely due to hypoxia. -Trend lactic acid -Will not give any IV fluids due to CHF exacerbation  GERD (gastroesophageal reflux disease): -hold pepcid due to QT prologation -continue protonix  Diet controlled DM without complications: N6F 6.7 on 06/24/18. Well controled. Blood sugar 175 by BMP -check CBG qAM   Inpatient status:  # Patient requires inpatient status due to high intensity of service, high risk for further deterioration and high frequency of surveillance required.  I certify that at the point of admission it is my clinical judgment that the patient will require inpatient hospital care spanning beyond 2 midnights from the point of admission.  . This patient has multiple chronic comorbidities including HTN, HLD, former smoker, diet-controled DM, COPD, chronic respiratory failure on 4L nasal cannula oxygen at home, cor pulmonale, GERD, depression, primary hyperparathyroidism, sCHF with EF 45%, colon cancer. . Now patient has presenting with acute on chronic respiratory failure with hypoxia and hypercapnia 2/2 COPD and CHF exacerbation.  Patient also has elevated troponin, elevated lactic acid. . The worrisome physical exam findings include decreased air movement bilaterally on auscultation . The initial radiographic and laboratory data are worrisome because of elevated BNP 1586, positive troponin 221, elevated lactic acid 2.2, calcium 10.6.  Chest x-ray showed cardiomegaly and vascular congestion. . Current medical needs: please see my assessment and plan Predictability of an adverse outcome (risk): Patient has multiple comorbidities, now presents with acute on chronic respiratory failure with hypoxia and hypercapnia 2/2 COPD and CHF exacerbation.  Patient also has elevated troponin, elevated lactic acid.  Her presentation is highly  complicated.  Patient is at high risk of deteriorating.  Patient is on BiPAP in stepdown bed.  Patient will need to be treated in hospital for at least 2 days.    DVT ppx:   SQ Lovenox Code Status: Full code Family Communication:   Yes, patient's husband   at bed side Disposition Plan:  Anticipate discharge back to previous home environment Consults called:  None Admission status:   Inpatient/SDU      Date of Service 10/28/2018    [facility]   If 7PM-7AM, please contact night-coverage [email or web address] Password [email or web address] 10/28/2018, 11:21 PM

## 2018-10-28 NOTE — ED Provider Notes (Signed)
[facility] DEPART[facility] Provider Note   CSN: [id number] Arrival date & time: 10/28/18  1923     History   Chief Complaint Chief Complaint  Patient presents with  . Shortness of Breath    HPI Denise [name] is a 73 y.o. female.     HPI   Patient presents today for SOB that is worse with exertion. Patient is normally SOB and recently had a COPD exacerbation for which she completed a prednisone course today. This evening, she suddenly had abrupt onset SOB. She was satting 92% on her home 4L, so a NRB was placed by EMS. Patient reports that she currently is extremely short of breath. Denies previous fever, cough different than her norm, other symptoms. Nothing seems to make her symptoms better.  Past Medical History:  Diagnosis Date  . Altered mental status   . Anxiety and depression   . Chronic respiratory failure with hypoxia ([place]) 10/02/2017   Assoc with ? Cor pulmonale dx 09/2017 so rx 02 1-2 lpm 24/7  - 10/17/2017  Walked RA x 3 laps @ 185 ft each stopped due to  End of study, nl pace,  desat to 87 on 3rd lap - 12/19/2017  Saturations on Room Air at Rest =91 % and while Ambulating = 87%  But  on  2 Liters of pulsed oxygen while Ambulating =93% so rec POC 2lpm walking / use at rest if sats under 90%      . Colon cancer ([place]) 1988   Resected  . COPD (chronic obstructive pulmonary disease) ([place])   . Diabetes mellitus without complication ([name])    diet controlled- no meds. per pt  . Diarrhea 04/02/2018  . Diverticulosis   . Hyperlipidemia   . Hypertension   . Insomnia   . Primary hyperparathyroidism ([place])   . SBO (small bowel obstruction) ([place])   . Tubular adenoma of rectum 02/23/2013   low grade  . Vitamin D deficiency     Patient Active Problem List   Diagnosis Date Noted  . COPD exacerbation ([place]) 10/28/2018  . Acute on chronic systolic CHF (congestive heart failure) ([place]) 10/28/2018  . Elevated troponin 10/28/2018  . Lactic acidosis  10/28/2018  . GERD (gastroesophageal reflux disease) 10/28/2018  . Acute on chronic respiratory failure with hypercapnia ([place]) 10/28/2018  . Diabetes mellitus without complication-diet controled 10/28/2018  . On home oxygen therapy 07/12/2018  . CHF (congestive heart failure) ([place]) 05/16/2018  . Chronic respiratory failure with hypoxia and hypercapnia ([place]) 05/03/2018  . Borderline abnormal TFTs 05/02/2018  . Type 2 diabetes mellitus ([place]) 04/26/2018  . Unintended weight loss 04/26/2018  . Lower extremity edema 02/20/2018  . Chronic respiratory failure with hypoxia ([place]) 10/02/2017  . Hematuria 09/27/2017  . Pulmonary hypertension ([name]) c/w cor pulmonale   . Protein-calorie malnutrition, severe 09/17/2017  . Constipation   . Hyperparathyroidism, primary ([place]) 07/27/2016  . Loss of appetite 06/02/2016  . COPD GOLD  II   11/22/2015  . Vitamin D deficiency 11/22/2015  . Anxiety and depression 11/22/2015  . Insomnia 11/22/2015  . Hyperlipidemia 11/22/2015  . Essential hypertension 11/22/2015  . Hx of colonic polyps 02/10/2013  . History of colon cancer 06/11/1986    Past Surgical History:  Procedure Laterality Date  . ABDOMINAL HYSTERECTOMY  08/2015  . BREAST BIOPSY Left   . BREAST EXCISIONAL BIOPSY Right   . COLON RESECTION  1980's  . COLONOSCOPY    . ENDOMETRIAL BIOPSY  2009   negative  .  INCONTINENCE SURGERY  2017  . RIGHT HEART CATH N/A 09/20/2017   Procedure: RIGHT HEART CATH;  Surgeon: Larey Dresser, MD;  Location: [facility];  Service: Cardiovascular;  Laterality: N/A;  . RIGHT/LEFT HEART CATH AND CORONARY ANGIOGRAPHY N/A 03/19/2018   Procedure: RIGHT/LEFT HEART CATH AND CORONARY ANGIOGRAPHY;  Surgeon: Larey Dresser, MD;  Location: [facility];  Service: Cardiovascular;  Laterality: N/A;     OB History   No obstetric history on file.      Home Medications    Prior to Admission medications   Medication Sig Start Date End Date Taking?  Authorizing Provider  albuterol (VENTOLIN HFA) 108 (90 Base) MCG/ACT inhaler INHALE 1-2 PUFFS INTO THE LUNGS EVERY 4 HOURS AS NEEDED FOR WHEEZING OR SHORTNESS OF BREATH. Patient taking differently: Inhale 1-2 puffs into the lungs every 4 (four) hours as needed for wheezing or shortness of breath. INHALE 1-2 PUFFS INTO THE LUNGS EVERY 4 HOURS AS NEEDED FOR WHEEZING OR SHORTNESS OF BREATH. 04/30/18  Yes Parrett, Tammy S, NP  aspirin EC 81 MG EC tablet Take 1 tablet (81 mg total) by mouth daily. 03/22/18  Yes Mikhail, Maryann, DO  atorvastatin (LIPITOR) 10 MG tablet TAKE 1 TABLET BY MOUTH EVERYDAY AT BEDTIME Patient taking differently: Take 10 mg by mouth at bedtime.  07/22/18  Yes Baity, Coralie Keens, NP  Blood Glucose Calibration (ACCU-CHEK AVIVA) SOLN USE AS INSTRUCTED TO TEST BLOOD SUGAR DAILY Patient taking differently: 1 each by Other route daily.  05/28/18  Yes [name], NP  Blood Glucose Monitoring Suppl (ACCU-CHEK AVIVA PLUS) w/Device KIT Use as instructed to test blood sugar up to 3 times daily Patient taking differently: 1 each by Other route 3 (three) times daily.  05/28/18  Yes [name], NP  Fluticasone-Umeclidin-Vilant (TRELEGY ELLIPTA) 100-62.5-25 MCG/INH AEPB Inhale 1 puff into the lungs daily. 10/24/18  Yes Tanda Rockers, MD  furosemide (LASIX) 40 MG tablet Take 1 tablet (40 mg total) by mouth daily. 10/03/18 10/03/19 Yes Larey Dresser, MD  glucose blood (ACCU-CHEK AVIVA PLUS) test strip Use as instructed to test blood sugar up to 3 times daily 05/28/18  Yes [name], NP  isosorbide-hydrALAZINE (BIDIL) 20-37.5 MG tablet Take 2 tablets by mouth 3 (three) times daily. 10/03/18  Yes Larey Dresser, MD  KLOR-CON M10 10 MEQ tablet TAKE 2 TABLETS (20 MEQ TOTAL) BY MOUTH DAILY. FOR LOW POTASSIUM. Patient taking differently: Take 10 mEq by mouth daily.  09/20/18  Yes Larey Dresser, MD  Lancets (ACCU-CHEK SOFT TOUCH) lancets Use as instructed to test blood sugar up to 3  times daily Patient taking differently: 1 each by Other route 3 (three) times daily.  05/28/18  Yes [name], NP  losartan (COZAAR) 100 MG tablet Take 50 mg by mouth daily.   Yes [provider]  mirtazapine (REMERON) 15 MG tablet TAKE 1 TABLET BY MOUTH AT BEDTIME. FOR DEPRESSION AND APPETITE. Patient taking differently: Take 15 mg by mouth at bedtime.  09/25/18  Yes [name], NP  predniSONE (DELTASONE) 10 MG tablet Take 4 for three days 3 for three days 2 for three days 1 for three days and stop 10/22/18  Yes Tanda Rockers, MD  spironolactone (ALDACTONE) 25 MG tablet Take 1 tablet (25 mg total) by mouth daily. Take at night Patient taking differently: Take 25 mg by mouth every evening.  08/29/18  Yes Larey Dresser, MD  famotidine (PEPCID)  40 MG tablet TAKE 1 TABLET BY MOUTH TWICE A DAY Patient taking differently: Take 40 mg by mouth 2 (two) times daily.  07/11/18   Tanda Rockers, MD  omeprazole (PRILOSEC) 20 MG capsule Take 20 mg by mouth daily.    [provider]  OXYGEN Inhale 4 L into the lungs continuous.     [provider]  Polyethylene Glycol 3350 (MIRALAX PO) Take 17 g by mouth as needed.    [provider]  glipiZIDE (GLUCOTROL) 5 MG tablet Take 0.5 tablets (2.5 mg total) by mouth daily before breakfast. 03/22/18 04/26/18  Cristal Ford, DO    Family History Family History  Problem Relation Age of Onset  . Ovarian cancer Mother   . Breast cancer Neg Hx   . Hyperparathyroidism Neg Hx   . Colon cancer Neg Hx   . Esophageal cancer Neg Hx   . Stomach cancer Neg Hx     Social History Social History   Tobacco Use  . Smoking status: Former Smoker    Packs/day: 1.00    Years: 30.00    Pack years: 30.00    Types: Cigarettes    Quit date: 1989    Years since quitting: 31.8  . Smokeless tobacco: Never Used  Substance Use Topics  . Alcohol use: No  . Drug use: No     Allergies   Patient has no known allergies.    Review of Systems Review of Systems  Constitutional: Negative for fever.  Respiratory: Positive for cough and shortness of breath. Negative for wheezing.   Cardiovascular: Negative for chest pain and palpitations.  Gastrointestinal: Negative for abdominal pain, nausea and vomiting.  Skin: Negative for rash and wound.  All other systems reviewed and are negative.    Physical Exam Updated Vital Signs BP 132/80   Pulse (!) 53   Temp 98 F (36.7 C) (Oral)   Resp (!) 26   Ht _0  (1.676 m)   Wt 48.4 kg   SpO2 96%   BMI 17.22 kg/m   Physical Exam Vitals signs and nursing note reviewed.  Constitutional:      Appearance: She is not diaphoretic.  HENT:     Head: Normocephalic and atraumatic.  Eyes:     Conjunctiva/sclera: Conjunctivae normal.  Neck:     Musculoskeletal: Neck supple.  Cardiovascular:     Rate and Rhythm: Normal rate and regular rhythm.     Heart sounds: No murmur.  Pulmonary:     Effort: Tachypnea and accessory muscle usage present. No respiratory distress.     Breath sounds: Decreased breath sounds present. No wheezing, rhonchi or rales.     Comments: Subcostal and abdominal retractions present. Generalized decreased breath sounds. On a NRB currently. Chest:     Chest wall: No tenderness.  Abdominal:     Palpations: Abdomen is soft.     Tenderness: There is no abdominal tenderness.  Musculoskeletal:     Comments: 1+ pitting edema bilaterally around ankles, but does not track up legs.  Skin:    General: Skin is warm and dry.  Neurological:     General: No focal deficit present.     Mental Status: She is alert.      ED Treatments / Results  Labs (all labs ordered are listed, but only abnormal results are displayed) Labs Reviewed  CBC WITH DIFFERENTIAL/PLATELET - Abnormal; Notable for the following components:      Result Value   Lymphs Abs 0.5 (*)  All other components within normal limits  COMPREHENSIVE METABOLIC PANEL - Abnormal; Notable  for the following components:   Glucose, Bld 175 (*)    Calcium 10.6 (*)    Total Protein 6.0 (*)    GFR calc non Af Amer 57 (*)    All other components within normal limits  LACTIC ACID, PLASMA - Abnormal; Notable for the following components:   Lactic Acid, Venous 2.2 (*)    All other components within normal limits  LACTIC ACID, PLASMA - Abnormal; Notable for the following components:   Lactic Acid, Venous 2.3 (*)    All other components within normal limits  BRAIN NATRIURETIC PEPTIDE - Abnormal; Notable for the following components:   B Natriuretic Peptide 1,586.2 (*)    All other components within normal limits  POCT I-STAT EG7 - Abnormal; Notable for the following components:   pO2, Ven 86.0 (*)    Bicarbonate 33.3 (*)    TCO2 35 (*)    Acid-Base Excess 7.0 (*)    All other components within normal limits  TROPONIN I (HIGH SENSITIVITY) - Abnormal; Notable for the following components:   Troponin I (High Sensitivity) 221 (*)    All other components within normal limits  TROPONIN I (HIGH SENSITIVITY) - Abnormal; Notable for the following components:   Troponin I (High Sensitivity) 229 (*)    All other components within normal limits  SARS CORONAVIRUS 2 (TAT 6-24 HRS)  RESPIRATORY PANEL BY PCR  PROTIME-INR  APTT  INFLUENZA PANEL BY PCR (TYPE A & B)  BASIC METABOLIC PANEL  MAGNESIUM  I-STAT VENOUS BLOOD GAS, ED  TROPONIN I (HIGH SENSITIVITY)  TROPONIN I (HIGH SENSITIVITY)    EKG EKG Interpretation  Date/Time:  Monday October 28 2018 19:30:16 EDT Ventricular Rate:  102 PR Interval:  118 QRS Duration: 74 QT Interval:  456 QTC Calculation: 594 R Axis:     Text Interpretation: Sinus tachycardia Premature atrial complexes Premature ventricular complexes Non-specific ST-t changes Baseline wander Confirmed by Lajean Saver [phone]) on 10/28/2018 7:45:33 PM   Radiology Dg Chest Portable 1 View  Result Date: 10/28/2018 CLINICAL DATA:  Chest pain and shortness of breath  EXAM: PORTABLE CHEST 1 VIEW COMPARISON:  03/15/2018 FINDINGS: Cardiomegaly with central vascular congestion. Background emphysema suspected. No definite focal pneumonia, collapse or consolidation. Negative for edema, effusion or pneumothorax. Trachea midline. Aorta atherosclerotic. Degenerative changes of the spine with associated scoliosis. IMPRESSION: Cardiomegaly with vascular congestion Emphysema Atherosclerosis No significant interval change or superimposed acute process Electronically Signed   By: Jerilynn Mages.  Shick M.D.   On: 10/28/2018 20:15    Procedures Procedures (including critical care time)  Medications Ordered in ED Medications  furosemide (LASIX) injection 40 mg (40 mg Intravenous Given 10/28/18 2347)  ipratropium (ATROVENT HFA) inhaler 2 puff (2 puffs Inhalation Given 10/29/18 0011)  albuterol (VENTOLIN HFA) 108 (90 Base) MCG/ACT inhaler 2 puff (has no administration in time range)  dextromethorphan-guaiFENesin (MUCINEX DM) 30-600 MG per 12 hr tablet 1 tablet (has no administration in time range)  methylPREDNISolone sodium succinate (SOLU-MEDROL) 40 mg/mL injection 40 mg (has no administration in time range)  aspirin EC tablet 81 mg (has no administration in time range)  atorvastatin (LIPITOR) tablet 10 mg (has no administration in time range)  isosorbide-hydrALAZINE (BIDIL) 20-37.5 MG per tablet 2 tablet (2 tablets Oral Given 10/29/18 0011)  losartan (COZAAR) tablet 50 mg (has no administration in time range)  mirtazapine (REMERON) tablet 15 mg (15 mg Oral Given 10/29/18 0011)  pantoprazole (PROTONIX)  EC tablet 40 mg (has no administration in time range)  polyethylene glycol (MIRALAX / GLYCOLAX) packet 17 g (has no administration in time range)  Fluticasone-Umeclidin-Vilant 100-62.5-25 MCG/INH AEPB 1 puff (has no administration in time range)  sodium chloride flush (NS) 0.9 % injection 3 mL (3 mLs Intravenous Given 10/28/18 2347)  sodium chloride flush (NS) 0.9 % injection 3 mL (has no  administration in time range)  0.9 %  sodium chloride infusion (has no administration in time range)  acetaminophen (TYLENOL) tablet 650 mg (has no administration in time range)  enoxaparin (LOVENOX) injection 40 mg (40 mg Subcutaneous Given 10/28/18 2346)  hydrALAZINE (APRESOLINE) tablet 25 mg (has no administration in time range)  albuterol (VENTOLIN HFA) 108 (90 Base) MCG/ACT inhaler 4 puff (4 puffs Inhalation Given 10/28/18 2032)  ipratropium (ATROVENT HFA) inhaler 2 puff (2 puffs Inhalation Given 10/28/18 2033)  albuterol (VENTOLIN HFA) 108 (90 Base) MCG/ACT inhaler 4 puff (4 puffs Inhalation Given 10/28/18 2136)  ipratropium (ATROVENT HFA) inhaler 2 puff (2 puffs Inhalation Given 10/28/18 2136)  dexamethasone (DECADRON) injection 20 mg (20 mg Intravenous Given 10/28/18 2230)  albuterol (VENTOLIN HFA) 108 (90 Base) MCG/ACT inhaler 2 puff (2 puffs Inhalation Given 10/28/18 2230)     Initial Impression / Assessment and Plan / ED Course  I have reviewed the triage vital signs and the nursing notes.  Pertinent labs & imaging results that were available during my care of the patient were reviewed by me and considered in my medical decision making (see chart for details).        Jemmie A. Joost is a 73 y.o. female with a complex PMH of DM, CHF, COPD presents today for SOB after recent COPD exacerbation. Presentation is consistent with CHF with COPD exacerbations. Labs ordered. Patient comfortable on high flow [place] vs NRB although increased work of breathing. Steroids ordered. Breathing treatments ordered.  Patient did not have a pneumonia on CXR. Labs showed an elevated BNP, but not as elevated as previously recorded. Lactic acid mildly elevated. Delta troponin 9 although elevated, likely from demand. Steroids ordered. Admitted to medicine service for further evaluation. Care of patient was discussed with the supervising attending.  Final Clinical Impressions(s) / ED Diagnoses   Final  diagnoses:  Acute on chronic congestive heart failure, unspecified heart failure type ([place])  COPD exacerbation ([name])  SOB (shortness of breath)    ED Discharge Orders    None       Julianne Rice, MD 10/29/18 [id number]    Lajean Saver, MD 10/31/18 1432

## 2018-10-29 ENCOUNTER — Inpatient Hospital Stay (HOSPITAL_COMMUNITY): Payer: Medicare HMO

## 2018-10-29 DIAGNOSIS — I5023 Acute on chronic systolic (congestive) heart failure: Secondary | ICD-10-CM

## 2018-10-29 LAB — POCT I-STAT 7, (LYTES, BLD GAS, ICA,H+H)
Acid-Base Excess: 6 mmol/L — ABNORMAL HIGH (ref 0.0–2.0)
Acid-Base Excess: 5 mmol/L — ABNORMAL HIGH (ref 0.0–2.0)
Bicarbonate: 30.4 mmol/L — ABNORMAL HIGH (ref 20.0–28.0)
Bicarbonate: 30.2 mmol/L — ABNORMAL HIGH (ref 20.0–28.0)
Calcium, Ion: 1.41 mmol/L — ABNORMAL HIGH (ref 1.15–1.40)
Calcium, Ion: 1.4 mmol/L (ref 1.15–1.40)
HCT: 37 % (ref 36.0–46.0)
HCT: 37 % (ref 36.0–46.0)
Hemoglobin: 12.6 g/dL (ref 12.0–15.0)
Hemoglobin: 12.6 g/dL (ref 12.0–15.0)
O2 Saturation: 99 %
O2 Saturation: 94 %
Patient temperature: 98.6
Patient temperature: 98
Potassium: 3.8 mmol/L (ref 3.5–5.1)
Potassium: 3.7 mmol/L (ref 3.5–5.1)
Sodium: 138 mmol/L (ref 135–145)
Sodium: 140 mmol/L (ref 135–145)
TCO2: 32 mmol/L (ref 22–32)
TCO2: 31 mmol/L (ref 22–32)
pCO2 arterial: 42.2 mmHg (ref 32.0–48.0)
pCO2 arterial: 43 mmHg (ref 32.0–48.0)
pH, Arterial: 7.465 — ABNORMAL HIGH (ref 7.350–7.450)
pH, Arterial: 7.453 — ABNORMAL HIGH (ref 7.350–7.450)
pO2, Arterial: 141 mmHg — ABNORMAL HIGH (ref 83.0–108.0)
pO2, Arterial: 66 mmHg — ABNORMAL LOW (ref 83.0–108.0)

## 2018-10-29 LAB — D-DIMER, QUANTITATIVE: D-Dimer, Quant: 1.89 ug{FEU}/mL — ABNORMAL HIGH (ref 0.00–0.50)

## 2018-10-29 LAB — CBG MONITORING, ED: Glucose-Capillary: 206 mg/dL — ABNORMAL HIGH (ref 70–99)

## 2018-10-29 LAB — BASIC METABOLIC PANEL WITH GFR
Anion gap: 11 (ref 5–15)
BUN: 17 mg/dL (ref 8–23)
CO2: 27 mmol/L (ref 22–32)
Calcium: 10.3 mg/dL (ref 8.9–10.3)
Chloride: 101 mmol/L (ref 98–111)
Creatinine, Ser: 1.2 mg/dL — ABNORMAL HIGH (ref 0.44–1.00)
GFR calc Af Amer: 52 mL/min — ABNORMAL LOW
GFR calc non Af Amer: 45 mL/min — ABNORMAL LOW
Glucose, Bld: 300 mg/dL — ABNORMAL HIGH (ref 70–99)
Potassium: 3.7 mmol/L (ref 3.5–5.1)
Sodium: 139 mmol/L (ref 135–145)

## 2018-10-29 LAB — LACTIC ACID, PLASMA
Lactic Acid, Venous: 3.3 mmol/L (ref 0.5–1.9)
Lactic Acid, Venous: 3.2 mmol/L (ref 0.5–1.9)

## 2018-10-29 LAB — PROTIME-INR
INR: 1.1 (ref 0.8–1.2)
Prothrombin Time: 14.1 seconds (ref 11.4–15.2)

## 2018-10-29 LAB — TROPONIN I (HIGH SENSITIVITY)
Troponin I (High Sensitivity): 239 ng/L (ref <18)
Troponin I (High Sensitivity): 221 ng/L (ref <18)

## 2018-10-29 LAB — ECHOCARDIOGRAM COMPLETE
Height: 66 in
Weight: 1707.24 oz

## 2018-10-29 LAB — APTT: aPTT: 28 s (ref 24–36)

## 2018-10-29 LAB — SARS CORONAVIRUS 2 (TAT 6-24 HRS): SARS Coronavirus 2: NEGATIVE

## 2018-10-29 LAB — MAGNESIUM
Magnesium: 1.5 mg/dL — ABNORMAL LOW (ref 1.7–2.4)
Magnesium: 1.5 mg/dL — ABNORMAL LOW (ref 1.7–2.4)

## 2018-10-29 MED ORDER — FENTANYL CITRATE (PF) 100 MCG/2ML IJ SOLN
25.0000 ug | Freq: Once | INTRAMUSCULAR | Status: AC
  Administered 2018-10-29: 25 ug via INTRAVENOUS
  Filled 2018-10-29: qty 2

## 2018-10-29 MED ORDER — FUROSEMIDE 40 MG PO TABS
40.0000 mg | ORAL_TABLET | Freq: Every day | ORAL | Status: DC
  Administered 2018-10-30: 40 mg via ORAL
  Filled 2018-10-29: qty 1

## 2018-10-29 MED ORDER — HEPARIN BOLUS VIA INFUSION
3500.0000 [IU] | Freq: Once | INTRAVENOUS | Status: AC
  Administered 2018-10-29: 3500 [IU] via INTRAVENOUS
  Filled 2018-10-29: qty 3500

## 2018-10-29 MED ORDER — METOPROLOL TARTRATE 5 MG/5ML IV SOLN
5.0000 mg | Freq: Once | INTRAVENOUS | Status: AC
  Administered 2018-10-29: 15:00:00 5 mg via INTRAVENOUS
  Filled 2018-10-29 (×2): qty 5

## 2018-10-29 MED ORDER — IPRATROPIUM-ALBUTEROL 0.5-2.5 (3) MG/3ML IN SOLN
3.0000 mL | Freq: Four times a day (QID) | RESPIRATORY_TRACT | Status: DC
  Administered 2018-10-29 (×2): 3 mL via RESPIRATORY_TRACT
  Filled 2018-10-29: qty 3

## 2018-10-29 MED ORDER — IPRATROPIUM BROMIDE 0.02 % IN SOLN
0.5000 mg | RESPIRATORY_TRACT | Status: DC
  Administered 2018-10-29 (×2): 0.5 mg via RESPIRATORY_TRACT
  Filled 2018-10-29 (×2): qty 2.5

## 2018-10-29 MED ORDER — POTASSIUM CHLORIDE CRYS ER 20 MEQ PO TBCR
40.0000 meq | EXTENDED_RELEASE_TABLET | Freq: Once | ORAL | Status: AC
  Administered 2018-10-29: 40 meq via ORAL
  Filled 2018-10-29: qty 2

## 2018-10-29 MED ORDER — MAGNESIUM SULFATE 2 GM/50ML IV SOLN
2.0000 g | Freq: Once | INTRAVENOUS | Status: AC
  Administered 2018-10-29: 23:00:00 2 g via INTRAVENOUS
  Filled 2018-10-29: qty 50

## 2018-10-29 MED ORDER — HEPARIN (PORCINE) 25000 UT/250ML-% IV SOLN
750.0000 [IU]/h | INTRAVENOUS | Status: DC
  Administered 2018-10-29: 750 [IU]/h via INTRAVENOUS
  Filled 2018-10-29: qty 250

## 2018-10-29 MED ORDER — SODIUM CHLORIDE 0.9 % IV SOLN
500.0000 mg | INTRAVENOUS | Status: DC
  Administered 2018-10-29 – 2018-10-30 (×2): 500 mg via INTRAVENOUS
  Filled 2018-10-29 (×3): qty 500

## 2018-10-29 MED ORDER — IPRATROPIUM BROMIDE 0.02 % IN SOLN
0.5000 mg | Freq: Three times a day (TID) | RESPIRATORY_TRACT | Status: DC
  Filled 2018-10-29: qty 2.5

## 2018-10-29 MED ORDER — MAGNESIUM SULFATE 2 GM/50ML IV SOLN
2.0000 g | Freq: Once | INTRAVENOUS | Status: AC
  Administered 2018-10-29: 2 g via INTRAVENOUS
  Filled 2018-10-29: qty 50

## 2018-10-29 MED ORDER — IOHEXOL 350 MG/ML SOLN
75.0000 mL | Freq: Once | INTRAVENOUS | Status: AC | PRN
  Administered 2018-10-29: 75 mL via INTRAVENOUS

## 2018-10-29 MED ORDER — UMECLIDINIUM BROMIDE 62.5 MCG/INH IN AEPB
1.0000 | INHALATION_SPRAY | Freq: Every day | RESPIRATORY_TRACT | Status: DC
  Administered 2018-10-29 – 2018-10-31 (×3): 1 via RESPIRATORY_TRACT
  Filled 2018-10-29 (×2): qty 7

## 2018-10-29 MED ORDER — FLUTICASONE FUROATE-VILANTEROL 100-25 MCG/INH IN AEPB
1.0000 | INHALATION_SPRAY | Freq: Every day | RESPIRATORY_TRACT | Status: DC
  Administered 2018-10-29 – 2018-10-30 (×2): 1 via RESPIRATORY_TRACT
  Administered 2018-11-01: 09:00:00 via RESPIRATORY_TRACT
  Administered 2018-11-02: 08:00:00 1 via RESPIRATORY_TRACT
  Filled 2018-10-29 (×2): qty 28

## 2018-10-29 NOTE — ED Notes (Signed)
Lunch Tray Ordered @ 1113.

## 2018-10-29 NOTE — ED Notes (Signed)
Pt extremely tachypneic and tachcardic. RR in 50s, HR 130s-180s. SPO2 in 80s on 6L [place]. Paged admitting MD. New orders received to get ddimer, give IV metoprolol. Place patient back on bipap, repeat ABG. Will continue to monitor.

## 2018-10-29 NOTE — ED Notes (Signed)
Pt called out to RN that she felt like she could not breath. Pt states, "I need oxygen." Pt currently on 6L [place]. RN placed pt on 15 L NRB d/t RR 40-60. Pt maintain O2 sat above 95% on 6L [place]. Pt states she's feeling more dizzy. RN paged MD.

## 2018-10-29 NOTE — Progress Notes (Signed)
PROGRESS NOTE    Denise [name]  TOI:[id number] DOB: 04-12-1945 DOA: 10/28/2018 PCP: [name], NP   Brief Narrative:  Denise [name] is a 73 y.o. female with medical history significant of HTN, HLD, former smoker, diet-controled DM, COPD, chronic respiratory failure on 4L nasal cannula oxygen at home, cor pulmonale, GERD, depression, primary hyperparathyroidism, sCHF with EF 45%, colon cancer, pre on 10/28/2018 with shortness of breath for last few days which have been progressively worsening.  Worse with exertion.  Some dry cough but no chest pain, fever or chills.  Upon arrival to ED, she was hypoxic and was found to be using accessory respiratory muscles and was placed on nonrebreather.  Although her chest x-ray did not show any pulmonary edema however she was then diagnosed with acute COPD exacerbation and acute on chronic systolic congestive heart failure since she had elevated BNP.  She was started on Solu-Medrol and IV Lasix and was admitted under hospitalist service.   Assessment & [name]:   Principal Problem:   Chronic respiratory failure with hypoxia and hypercapnia ([name]) Active Problems:   Hyperlipidemia   Hyperparathyroidism, primary ([place])   Pulmonary hypertension ([name]) c/w cor pulmonale   COPD exacerbation ([name])   Acute on chronic systolic CHF (congestive heart failure) ([name])   Elevated troponin   Lactic acidosis   GERD (gastroesophageal reflux disease)   Acute on chronic respiratory failure with hypercapnia ([name])   Diabetes mellitus without complication-diet controled   Acute on chronic hypoxic and hypercapnic respiratory failure secondary to acute COPD exacerbation and acute on chronic congestive heart failure with preserved ejection fraction: Patient's baseline oxygen requirement is 4 L at home.  She was placed on nonrebreather upon arrival to ED.  Currently she is on 6 L.  Feeling much better.  No crackles on lung exam.  Looks dry clinically.   Patient was admitted with presumed acute on chronic systolic congestive heart failure however her ejection fraction is completely normal on echo done today and does not seem to be having acute CHF exacerbation at least on my exam today.  Noted that her BNP was around 1500 however when reviewed in detail in the past, her BNP seems to be averaging around 1000.  Will stop IV Lasix and place her back on her p.o. Lasix from home.  We will continue Solu-Medrol and will add azithromycin as anti-inflammatory.  Continue as needed duo nebs.  Wean oxygen.  Continue strict I's and O's, low-salt diet and fluid restriction.  Elevated troponin: Troponin hovering around 221x3.  No chest pain.  No EKG changes.  Due to the fact that they are flat and she does not have any ACS symptoms, this is likely not representing ACS but instead representing demand ischemia.  Monitor on telemetry.  She has had echo done just 20 days ago.  No need to repeat that.  Continue aspirin, Lipitor.  Hyperlipidemia: Continue Lipitor.  Lactic acidosis: Last lactic acid 2.3.  No signs of infection.  Will repeat lactic acid now.  GERD: Continue PPI.  Type 2 diabetes mellitus: Controlled.  Continue SSI.  Essential hypertension: Controlled.  Continue current regimen.  DVT prophylaxis: Lovenox Code Status: Full code Family Communication: Husband present at bedside.  [name] of care discussed with patient and husband in length and he verbalized understanding and agreed with it. Disposition [name]: Potential discharge tomorrow  Estimated body mass index is 17.22 kg/m as calculated from the following:   Height as of this encounter: _0  (1.676 m).  Weight as of this encounter: 48.4 kg.      Nutritional status:               Consultants:   None  Procedures:   None  Antimicrobials:   Azithromycin starting today   Subjective: Patient seen and examined while she is still in the ER.  Husband at the bedside.  She states  that she feels much better.  She is currently on 6 L of oxygen.  No chest pain.  No other complaint.  Objective: Vitals:   10/29/18 0750 10/29/18 0800 10/29/18 0900 10/29/18 1007  BP:  (!) 137/101 126/87   Pulse: 87 78 73   Resp: (!) 22 10 (!) 21   Temp:      TempSrc:      SpO2: 100% 100% 100% 92%  Weight:      Height:        Intake/Output Summary (Last 24 hours) at 10/29/2018 1300 Last data filed at 10/29/2018 1028 Gross per 24 hour  Intake -  Output 600 ml  Net -600 ml   Filed Weights   10/28/18 1930  Weight: 48.4 kg    Examination:  General exam: Appears calm and comfortable  Respiratory system: Diminished breath sounds.  No wheezes or crackles. Respiratory effort normal. Cardiovascular system: S1 & S2 heard, RRR. No JVD, murmurs, rubs, gallops or clicks. No pedal edema. Gastrointestinal system: Abdomen is nondistended, soft and nontender. No organomegaly or masses felt. Normal bowel sounds heard. Central nervous system: Alert and oriented. No focal neurological deficits. Extremities: Symmetric 5 x 5 power. Skin: No rashes, lesions or ulcers Psychiatry: Judgement and insight appear normal. Mood & affect appropriate.    Data Reviewed: I have personally reviewed following labs and imaging studies  CBC: Recent Labs  Lab 10/28/18 2044 10/28/18 2057 10/29/18 0137  WBC 6.9  --   --   NEUTROABS 6.0  --   --   HGB 13.0 13.9 12.6  HCT 42.1 41.0 37.0  MCV 86.1  --   --   PLT 208  --   --    Basic Metabolic Panel: Recent Labs  Lab 10/24/18 1206 10/28/18 2044 10/28/18 2057 10/29/18 0137 10/29/18 0257  NA 145 140 140 138 139  K 4.4 3.6 3.6 3.8 3.7  CL 105 102  --   --  101  CO2 29 27  --   --  27  GLUCOSE 130* 175*  --   --  300*  BUN 14 13  --   --  17  CREATININE 1.14* 0.98  --   --  1.20*  CALCIUM 11.1* 10.6*  --   --  10.3  MG  --   --   --   --  1.5*   GFR: Estimated Creatinine Clearance: 31.9 mL/min (A) (by C-G formula based on SCr of 1.2 mg/dL  (H)). Liver Function Tests: Recent Labs  Lab 10/28/18 2044  AST 32  ALT 24  ALKPHOS 83  BILITOT 0.7  PROT 6.0*  ALBUMIN 3.6   No results for input(s): LIPASE, AMYLASE in the last 168 hours. No results for input(s): AMMONIA in the last 168 hours. Coagulation Profile: Recent Labs  Lab 10/28/18 2317  INR 1.1   Cardiac Enzymes: No results for input(s): CKTOTAL, CKMB, CKMBINDEX, TROPONINI in the last 168 hours. BNP (last 3 results) Recent Labs    03/01/18 1218 05/02/18 1249 06/24/18 1111  PROBNP 1,336.0* 910.0* 840.0*   HbA1C: No results for input(s): HGBA1C  in the last 72 hours. CBG: Recent Labs  Lab 10/29/18 1052  GLUCAP 206*   Lipid Profile: No results for input(s): CHOL, HDL, LDLCALC, TRIG, CHOLHDL, LDLDIRECT in the last 72 hours. Thyroid Function Tests: No results for input(s): TSH, T4TOTAL, FREET4, T3FREE, THYROIDAB in the last 72 hours. Anemia Panel: No results for input(s): VITAMINB12, FOLATE, FERRITIN, TIBC, IRON, RETICCTPCT in the last 72 hours. Sepsis Labs: Recent Labs  Lab 10/28/18 2044 10/28/18 2227  LATICACIDVEN 2.2* 2.3*    Recent Results (from the past 240 hour(s))  SARS CORONAVIRUS 2 (TAT 6-24 HRS) Nasopharyngeal Nasopharyngeal Swab     Status: None   Collection Time: 10/28/18  7:56 PM   Specimen: Nasopharyngeal Swab  Result Value Ref Range Status   SARS Coronavirus 2 NEGATIVE NEGATIVE Final    Comment: (NOTE) SARS-CoV-2 target nucleic acids are NOT DETECTED. The SARS-CoV-2 RNA is generally detectable in upper and lower respiratory specimens during the acute phase of infection. Negative results do not preclude SARS-CoV-2 infection, do not rule out co-infections with other pathogens, and should not be used as the sole basis for treatment or other patient management decisions. Negative results must be combined with clinical observations, patient history, and epidemiological information. The expected result is Negative. Fact Sheet for  Patients: SugarRoll.be Fact Sheet for Healthcare Providers: https://www.woods-mathews.com/ This test is not yet approved or cleared by the [place] FDA and  has been authorized for detection and/or diagnosis of SARS-CoV-2 by FDA under an Emergency Use Authorization (EUA). This EUA will remain  in effect (meaning this test can be used) for the duration of the COVID-19 declaration under Section 56 4(b)(1) of the Act, 21 U.S.C. section 360bbb-3(b)(1), unless the authorization is terminated or revoked sooner. Performed at [facility], [place] [address] [place], [place] [address]       Radiology Studies: Dg Chest Portable 1 View  Result Date: 10/28/2018 CLINICAL DATA:  Chest pain and shortness of breath EXAM: PORTABLE CHEST 1 VIEW COMPARISON:  03/15/2018 FINDINGS: Cardiomegaly with central vascular congestion. Background emphysema suspected. No definite focal pneumonia, collapse or consolidation. Negative for edema, effusion or pneumothorax. Trachea midline. Aorta atherosclerotic. Degenerative changes of the spine with associated scoliosis. IMPRESSION: Cardiomegaly with vascular congestion Emphysema Atherosclerosis No significant interval change or superimposed acute process Electronically Signed   By: Jerilynn Mages.  Shick M.D.   On: 10/28/2018 20:15    Scheduled Meds: . aspirin EC  81 mg Oral Daily  . atorvastatin  10 mg Oral QHS  . enoxaparin (LOVENOX) injection  40 mg Subcutaneous Q24H  . fluticasone furoate-vilanterol  1 puff Inhalation Daily   Or  . umeclidinium bromide  1 puff Inhalation Daily  . furosemide  40 mg Intravenous BID  . ipratropium  0.5 mg Nebulization TID  . isosorbide-hydrALAZINE  2 tablet Oral TID  . losartan  50 mg Oral Daily  . methylPREDNISolone (SOLU-MEDROL) injection  40 mg Intravenous Q12H  . metoprolol tartrate  5 mg Intravenous Once  . mirtazapine  15 mg Oral QHS  . pantoprazole  40 mg Oral Daily  . sodium  chloride flush  3 mL Intravenous Q12H   Continuous Infusions: . sodium chloride       LOS: 1 day   Time spent: 36 minutes   Darliss Cheney, MD [facility]  10/29/2018, 1:00 PM   To contact the attending provider between 7A-7P or the covering provider during after hours 7P-7A, please log into the web site [email or web address] and use password [email or web address].

## 2018-10-29 NOTE — ED Notes (Signed)
Pt moved to negative pressure room as microbiology states it will be a while for covid to result.

## 2018-10-29 NOTE — Progress Notes (Signed)
  Echocardiogram 2D Echocardiogram has been performed.  Denise [name] 10/29/2018, 11:41 AM

## 2018-10-29 NOTE — Progress Notes (Signed)
Patient having increased heart rate 130swith frequent PVCs and shortness of breath. Patient currently on 5L 02, will call RRT to place pt on BiPAP. Paged [facility], got orders for iv magnesium and PO potassium. Will continue to monitor patient.

## 2018-10-29 NOTE — ED Notes (Addendum)
Order placed per protocol to scan bladder because pt had severe sensation to urinate but unable to start urine. Scan showed 827m. Pt straight cathed per protocol. 6226murine output. Will notify attending MD.

## 2018-10-29 NOTE — Progress Notes (Signed)
Pt taken off Bipap and placed on 2 l/m [place].  Pt resting comfortably, respirations even and unlabored, no visible work of breathing at this time.

## 2018-10-29 NOTE — Progress Notes (Signed)
Paged by RN around 3 PM to evaluate patient at bedside due to worsening respiratory distress.  Patient was already placed on BiPAP by RT.  Patient visibly seems to be using accessory muscles.  On examination, she has diminished breath sounds but no wheezes, crackles or rhonchi.  This raises suspicion for possible PE.  Stat ABG and D-dimer ordered.  Consulted pharmacy to start her on heparin empirically until PE ruled out.  If positive D-dimer, will proceed with CT angiogram of the chest.  Total 15 minutes of critical care spent.

## 2018-10-29 NOTE — ED Notes (Signed)
Dr. Blaine Hamper placing orders for pt.

## 2018-10-29 NOTE — ED Notes (Signed)
Dr. Blaine Hamper aware pt doing well on bipap. RN holding metoprolol d/t bp 106/70

## 2018-10-29 NOTE — ED Notes (Signed)
Patient is resting comfortably. 

## 2018-10-29 NOTE — Progress Notes (Signed)
ANTICOAGULATION CONSULT NOTE - Initial Consult  Pharmacy Consult for Heparin Indication: VTE Treatment vs possible NSTEMI  No Known Allergies  Patient Measurements: Height: _0  (167.6 cm) Weight: 106 lb 11.2 oz (48.4 kg) IBW/kg (Calculated) : 59.3 Heparin Dosing Weight: 48.4kg  Vital Signs: BP: 117/94 (10/27 1430) Pulse Rate: 73 (10/27 1430)  Labs: Recent Labs    10/28/18 2044 10/28/18 2057 10/28/18 2227 10/28/18 2317 10/29/18 0137 10/29/18 0257 10/29/18 0317  HGB 13.0 13.9  --   --  12.6  --   --   HCT 42.1 41.0  --   --  37.0  --   --   PLT 208  --   --   --   --   --   --   APTT  --   --   --  28  --   --   --   LABPROT  --   --   --  14.1  --   --   --   INR  --   --   --  1.1  --   --   --   CREATININE 0.98  --   --   --   --  1.20*  --   TROPONINIHS 221*  --  229*  --   --   --  221*    Estimated Creatinine Clearance: 31.9 mL/min (A) (by C-G formula based on SCr of 1.2 mg/dL (H)).   Medical History: Past Medical History:  Diagnosis Date  . Altered mental status   . Anxiety and depression   . Chronic respiratory failure with hypoxia ([place]) 10/02/2017   Assoc with ? Cor pulmonale dx 09/2017 so rx 02 1-2 lpm 24/7  - 10/17/2017  Walked RA x 3 laps @ 185 ft each stopped due to  End of study, nl pace,  desat to 87 on 3rd lap - 12/19/2017  Saturations on Room Air at Rest =91 % and while Ambulating = 87%  But  on  2 Liters of pulsed oxygen while Ambulating =93% so rec POC 2lpm walking / use at rest if sats under 90%      . Colon cancer ([place]) 1988   Resected  . COPD (chronic obstructive pulmonary disease) ([place])   . Diabetes mellitus without complication ([name])    diet controlled- no meds. per pt  . Diarrhea 04/02/2018  . Diverticulosis   . Hyperlipidemia   . Hypertension   . Insomnia   . Primary hyperparathyroidism ([place])   . SBO (small bowel obstruction) ([place])   . Tubular adenoma of rectum 02/23/2013   low grade  . Vitamin D deficiency     Medications:   Scheduled:  . aspirin EC  81 mg Oral Daily  . atorvastatin  10 mg Oral QHS  . enoxaparin (LOVENOX) injection  40 mg Subcutaneous Q24H  . fluticasone furoate-vilanterol  1 puff Inhalation Daily   Or  . umeclidinium bromide  1 puff Inhalation Daily  . [START ON 10/30/2018] furosemide  40 mg Oral Daily  . heparin  3,500 Units Intravenous Once  . ipratropium-albuterol  3 mL Nebulization Q6H  . isosorbide-hydrALAZINE  2 tablet Oral TID  . losartan  50 mg Oral Daily  . methylPREDNISolone (SOLU-MEDROL) injection  40 mg Intravenous Q12H  . mirtazapine  15 mg Oral QHS  . pantoprazole  40 mg Oral Daily  . sodium chloride flush  3 mL Intravenous Q12H    Assessment: Patient is a 15 yof with  a medical hx of HTN,COPD,CHF and colon cancer. The patient presented today with shortness of breath. The patient had an elevated BNP and elevated troponin.  Goal of Therapy:  Heparin level 0.3-0.7 units/ml Monitor platelets by anticoagulation protocol: Yes   Plan:  - Will bolus Heparin 3500 units  - Followed by Heparin 750 units/hr  - Will check heparin level in 8 hours  - Daily CBC and monitor for s/s bleeding  Duanne Limerick PharmD. BCPS  10/29/2018,3:33 PM

## 2018-10-30 ENCOUNTER — Inpatient Hospital Stay (HOSPITAL_COMMUNITY): Payer: Medicare HMO

## 2018-10-30 ENCOUNTER — Telehealth (HOSPITAL_COMMUNITY): Payer: Self-pay

## 2018-10-30 DIAGNOSIS — R0902 Hypoxemia: Secondary | ICD-10-CM

## 2018-10-30 DIAGNOSIS — J9621 Acute and chronic respiratory failure with hypoxia: Secondary | ICD-10-CM

## 2018-10-30 DIAGNOSIS — I5023 Acute on chronic systolic (congestive) heart failure: Secondary | ICD-10-CM | POA: Diagnosis not present

## 2018-10-30 DIAGNOSIS — J441 Chronic obstructive pulmonary disease with (acute) exacerbation: Secondary | ICD-10-CM

## 2018-10-30 HISTORY — DX: Hypoxemia: R09.02

## 2018-10-30 LAB — CBC
HCT: 42.2 % (ref 36.0–46.0)
Hemoglobin: 13.2 g/dL (ref 12.0–15.0)
MCH: 26.3 pg (ref 26.0–34.0)
MCHC: 31.3 g/dL (ref 30.0–36.0)
MCV: 84.1 fL (ref 80.0–100.0)
Platelets: 263 10*3/uL (ref 150–400)
RBC: 5.02 MIL/uL (ref 3.87–5.11)
RDW: 15.6 % — ABNORMAL HIGH (ref 11.5–15.5)
WBC: 9.4 10*3/uL (ref 4.0–10.5)
nRBC: 0 % (ref 0.0–0.2)

## 2018-10-30 LAB — BASIC METABOLIC PANEL
Anion gap: 12 (ref 5–15)
BUN: 24 mg/dL — ABNORMAL HIGH (ref 8–23)
CO2: 27 mmol/L (ref 22–32)
Calcium: 10.6 mg/dL — ABNORMAL HIGH (ref 8.9–10.3)
Chloride: 100 mmol/L (ref 98–111)
Creatinine, Ser: 1.28 mg/dL — ABNORMAL HIGH (ref 0.44–1.00)
GFR calc Af Amer: 48 mL/min — ABNORMAL LOW (ref >60)
GFR calc non Af Amer: 41 mL/min — ABNORMAL LOW (ref >60)
Glucose, Bld: 285 mg/dL — ABNORMAL HIGH (ref 70–99)
Potassium: 4 mmol/L (ref 3.5–5.1)
Sodium: 139 mmol/L (ref 135–145)

## 2018-10-30 LAB — GLUCOSE, CAPILLARY
Glucose-Capillary: 247 mg/dL — ABNORMAL HIGH (ref 70–99)
Glucose-Capillary: 166 mg/dL — ABNORMAL HIGH (ref 70–99)

## 2018-10-30 LAB — LACTIC ACID, PLASMA: Lactic Acid, Venous: 3.3 mmol/L (ref 0.5–1.9)

## 2018-10-30 LAB — HEPARIN LEVEL (UNFRACTIONATED): Heparin Unfractionated: 0.43 IU/mL (ref 0.30–0.70)

## 2018-10-30 LAB — MAGNESIUM: Magnesium: 2.7 mg/dL — ABNORMAL HIGH (ref 1.7–2.4)

## 2018-10-30 MED ORDER — FUROSEMIDE 40 MG PO TABS: 60.0000 mg | ORAL_TABLET | Freq: Every day | ORAL | Status: DC

## 2018-10-30 MED ORDER — ENOXAPARIN SODIUM 30 MG/0.3ML ~~LOC~~ SOLN
30.0000 mg | Freq: Every day | SUBCUTANEOUS | Status: DC
  Administered 2018-10-31: 10:00:00 30 mg via SUBCUTANEOUS
  Filled 2018-10-30: qty 0.3

## 2018-10-30 MED ORDER — LOSARTAN POTASSIUM 50 MG PO TABS
50.0000 mg | ORAL_TABLET | Freq: Every day | ORAL | Status: DC
  Administered 2018-10-31 – 2018-11-02 (×3): 50 mg via ORAL
  Filled 2018-10-30 (×3): qty 1

## 2018-10-30 MED ORDER — IPRATROPIUM-ALBUTEROL 0.5-2.5 (3) MG/3ML IN SOLN
3.0000 mL | Freq: Three times a day (TID) | RESPIRATORY_TRACT | Status: DC
  Administered 2018-10-30 – 2018-11-01 (×8): 3 mL via RESPIRATORY_TRACT
  Filled 2018-10-30 (×9): qty 3

## 2018-10-30 MED ORDER — PREDNISONE 20 MG PO TABS
40.0000 mg | ORAL_TABLET | Freq: Every day | ORAL | Status: DC
  Administered 2018-10-31 – 2018-11-02 (×3): 40 mg via ORAL
  Filled 2018-10-30 (×3): qty 2

## 2018-10-30 MED ORDER — FUROSEMIDE 10 MG/ML IJ SOLN
40.0000 mg | Freq: Two times a day (BID) | INTRAMUSCULAR | Status: DC
  Administered 2018-10-30 – 2018-10-31 (×3): 40 mg via INTRAVENOUS
  Filled 2018-10-30 (×3): qty 4

## 2018-10-30 MED ORDER — METOPROLOL TARTRATE 5 MG/5ML IV SOLN
5.0000 mg | Freq: Once | INTRAVENOUS | Status: AC
  Administered 2018-10-30: 15:00:00 5 mg via INTRAVENOUS
  Filled 2018-10-30: qty 5

## 2018-10-30 MED ORDER — INSULIN ASPART 100 UNIT/ML ~~LOC~~ SOLN
0.0000 [IU] | Freq: Three times a day (TID) | SUBCUTANEOUS | Status: DC
  Administered 2018-10-30 – 2018-10-31 (×4): 3 [IU] via SUBCUTANEOUS
  Administered 2018-11-01: 5 [IU] via SUBCUTANEOUS
  Administered 2018-11-01: 12:00:00 2 [IU] via SUBCUTANEOUS

## 2018-10-30 MED ORDER — ALPRAZOLAM 0.25 MG PO TABS
0.2500 mg | ORAL_TABLET | Freq: Every evening | ORAL | Status: DC | PRN
  Administered 2018-10-30: 0.25 mg via ORAL
  Filled 2018-10-30: qty 1

## 2018-10-30 NOTE — Progress Notes (Addendum)
   10/30/18 1100  Vitals  Pulse Rate 90  ECG Heart Rate (!) 106  Resp (!) 35  Oxygen Therapy  SpO2 (!) 76 %  O2 Device Room Air  MEWS Score  MEWS RR 2  MEWS Pulse 1  MEWS Systolic 0  MEWS LOC 0  MEWS Temp 0  MEWS Score 3  MEWS Score Color Yellow   Patient was found to have stats in the ~70, in respiratory distress. Patient was hooked up to neb treatment mask with treatment finished but not hooked up to walk oxygen. RN placed patient on 6L nasal cannula but ended up placing back on Bipap and notifying RRT. I will continue to monitor the patient closely.  Saddie Benders RN BSN   Notified MD with changes and implemented protocol. No new orders at this time.

## 2018-10-30 NOTE — Progress Notes (Signed)
   10/30/18 1000  Vitals  Pulse Rate 87  ECG Heart Rate 92  Resp (!) 23  Oxygen Therapy  SpO2 95 %  O2 Device Nasal Cannula  O2 Flow Rate (L/min) 4 L/min  MEWS Score  MEWS RR 1  MEWS Pulse 0  MEWS Systolic 0  MEWS LOC 0  MEWS Temp 0  MEWS Score 1  MEWS Score Color Green   Remove patient from Bipap, feeling and looking better this am. VVS. Placed on 4L nasal cannula. I will continue to monitor the patient closely.   Saddie Benders RN BSN

## 2018-10-30 NOTE — Progress Notes (Signed)
10/30/18 1428  MEWS Score  Resp (!) 29  ECG Heart Rate (!) 113  Pulse Rate 89  SpO2 (!) 88 %  O2 Device Nasal Cannula  O2 Flow Rate (L/min) 6 L/min  MEWS Score  MEWS RR 2  MEWS Pulse 2  MEWS Systolic 0  MEWS LOC 0  MEWS Temp 0  MEWS Score 4  MEWS Score Color Red  MEWS Assessment  Is this an acute change? Yes  MEWS guidelines implemented *See [place]  Provider Notification  Provider Name/Title Neysa Bonito  Date Provider Notified 10/30/18  Time Provider Notified 1420  Notification Type Page  Notification Reason Change in status  Response See new orders  Date of Provider Response 10/30/18  Time of Provider Response 1443    Paged MD with changes and new orders place for blood work and IV lopressor. I will continue to monitor the patient closely.   Saddie Benders RN BSN

## 2018-10-30 NOTE — Progress Notes (Signed)
PROGRESS NOTE    Denise Derry A. Marvel Plan   SAY:[id number] DOB: 01-07-1945 DOA: 10/28/2018  PCP: [name], NP   [facility] [place] Summary  Denise Derry A. Richardsonis a 73 y.o.femalewith medical history significant ofHTN, HLD,former smoker,diet-controled DM, COPD,chronic respiratory failureon 4L nasal cannula oxygen at home, cor pulmonale, GERD, depression, primary hyperparathyroidism,sCHF with EF 45%, colon cancer, pre on 10/28/2018 with shortness of breath for last few days which have been progressively worsening.  Worse with exertion.  Some dry cough but no chest pain, fever or chills.  Upon arrival to ED, she was hypoxic and was found to be using accessory respiratory muscles and was placed on nonrebreather.  Although her chest x-ray did not show any pulmonary edema however she was diagnosed with acute COPD exacerbation and acute on chronic systolic congestive heart failure since she had elevated BNP.  She was started on Solu-Medrol and IV Lasix and was admitted under hospitalist service. Overnight 10/27-10/28, patient had respiratory distress, placed on Bipap and started on empiric Heparin for concern of PE. Had negative CTA chest for PE. Echo showed small circumfrential pericardial effusion and findings concerning for amyloid, cardiology was consulted.   A & P   Principal Problem:   Chronic respiratory failure with hypoxia and hypercapnia ([name]) Active Problems:   Hyperlipidemia   Hyperparathyroidism, primary ([place])   Pulmonary hypertension ([name]) c/w cor pulmonale   COPD exacerbation ([name])   Acute on chronic systolic CHF (congestive heart failure) ([name])   Elevated troponin   Lactic acidosis   GERD (gastroesophageal reflux disease)   Acute on chronic respiratory failure with hypercapnia ([name])   Diabetes mellitus without complication-diet controled   Acute on chronic hypoxemic respiratory failure thought initially secondary to COPD exacerbation and acute on chronic CHF however BNP  chronically elevated and IV Lasix stopped on 10/27, placed back on p.o. Currently appears euvolemic.  On 4 L oxygen at home, currently tolerating BiPAP.  On Solu-Medrol and azithromycin for suspected COPD exacerbation.  Respiratory distress overnight with negative CT chest for PE.  Given IV magnesium and p.o. potassium.  Echo concerning for possible cardiac amyloid.   . Serum kappa/lambda free light chain ratio . Serum protein immunofixation . Urine protein immunofixation . Cardiology consulted . Change IV solumedrol to PO prednisone 40 mg for total 5 days steroids  . Continue Azithromycin . Continue PO lasix . Continue nebulizers/inhalers  Elevated troponin Right/left heart cath 03/19/2018 with near normal filling pressures, moderate PAH, low cardiac output and mild luminal irregularities on coronary angiogram. No chest pain. EKG with episodes of tachycardia and frequent PVCs, received IV metoprolol yesterday. Likely demand ischemia. . Cardiology consulted as above  Hyperlipidemia continue lipitor  Lactic acidosis No sign of infection, unlikely sepsis though trending upward. May be secondary to albuterol neb. . monitor  AKI on CKD 3 Cr 1.28, baseline 0.7-0.9.  [name] Hold today's losartan  Hypomagnesemia repeat lab today and replace as necessary  Diastolic heart failure compensated. Chronically elevated BNP likely related to other chronic issues. . Continue PO lasix . Continue Bidil . Hold losartan  Hypertension stable . Hold losartan as above, otherwise continue regimen . Hydralazine PRN  GERD Continue PPI  Type 2 Diabetes with hyperglycemia in setting of steroids. . Start SSI  DVT prophylaxis: Lovenox   Code Status: Full Code  Diet: 2g sodium restriction Disposition Plan: Medical stability  Consultants  . Cardiology  Procedures  . none  Antibiotics  Day 2/5 of Azithromycin for COPD      Subjective  Patient seen and examined sitting at bedside with BiPAP on.  Overnight patient had acute respiratory distress and was started empirically on heparin for suspected PE and went for CT chest which was negative for PE. She was placed on Bipap. Also had runs of tachycardia. Patient states she is currently feeling well without any complaints of chest pain, palpitations, shortness of breath or any other complaints. States that her symptoms have improved from overnight.    Objective   Vitals:   10/30/18 0500 10/30/18 0531 10/30/18 0737 10/30/18 0738  BP:  (!) 155/118  (!) 134/109  Pulse: 89 93 97 89  Resp:  (!) 25 (!) 24 (!) 22  Temp: 97.6 F (36.4 C)     TempSrc: Temporal     SpO2:  97% 99% 98%  Weight: 47.2 kg     Height:        Intake/Output Summary (Last 24 hours) at 10/30/2018 0951 Last data filed at 10/29/2018 2000 Gross per 24 hour  Intake 180 ml  Output 600 ml  Net -420 ml   Filed Weights   10/28/18 1930 10/30/18 0500  Weight: 48.4 kg 47.2 kg    Examination:  Physical Exam Vitals signs and nursing note reviewed.  Constitutional:      Appearance: Normal appearance.     Comments: frail  HENT:     Head: Normocephalic and atraumatic.     Comments: Bipap on Eyes:     Extraocular Movements: Extraocular movements intact.  Neck:     Musculoskeletal: Normal range of motion. No neck rigidity.  Cardiovascular:     Rate and Rhythm: Normal rate. Rhythm irregular.     Heart sounds: No murmur.  Pulmonary:     Effort: Pulmonary effort is normal.     Breath sounds: Normal breath sounds. No wheezing or rales.  Abdominal:     General: Abdomen is flat.     Palpations: Abdomen is soft.  Musculoskeletal: Normal range of motion.        General: No swelling.  Neurological:     General: No focal deficit present.     Mental Status: She is alert. Mental status is at baseline.  Psychiatric:        Mood and Affect: Mood normal.        Behavior: Behavior normal.     Data Reviewed: I have personally reviewed following labs and imaging studies   CBC: Recent Labs  Lab 10/28/18 2044 10/28/18 2057 10/29/18 0137 10/29/18 1558  WBC 6.9  --   --   --   NEUTROABS 6.0  --   --   --   HGB 13.0 13.9 12.6 12.6  HCT 42.1 41.0 37.0 37.0  MCV 86.1  --   --   --   PLT 208  --   --   --    Basic Metabolic Panel: Recent Labs  Lab 10/24/18 1206 10/28/18 2044 10/28/18 2057 10/29/18 0137 10/29/18 0257 10/29/18 1558 10/29/18 2151 10/30/18 0027  NA 145 140 140 138 139 140  --  139  K 4.4 3.6 3.6 3.8 3.7 3.7  --  4.0  CL 105 102  --   --  101  --   --  100  CO2 29 27  --   --  27  --   --  27  GLUCOSE 130* 175*  --   --  300*  --   --  285*  BUN 14 13  --   --  17  --   --  24*  CREATININE 1.14* 0.98  --   --  1.20*  --   --  1.28*  CALCIUM 11.1* 10.6*  --   --  10.3  --   --  10.6*  MG  --   --   --   --  1.5*  --  1.5*  --    GFR: Estimated Creatinine Clearance: 29.2 mL/min (A) (by C-G formula based on SCr of 1.28 mg/dL (H)). Liver Function Tests: Recent Labs  Lab 10/28/18 2044  AST 32  ALT 24  ALKPHOS 83  BILITOT 0.7  PROT 6.0*  ALBUMIN 3.6   No results for input(s): LIPASE, AMYLASE in the last 168 hours. No results for input(s): AMMONIA in the last 168 hours. Coagulation Profile: Recent Labs  Lab 10/28/18 2317  INR 1.1   Cardiac Enzymes: No results for input(s): CKTOTAL, CKMB, CKMBINDEX, TROPONINI in the last 168 hours. BNP (last 3 results) Recent Labs    03/01/18 1218 05/02/18 1249 06/24/18 1111  PROBNP 1,336.0* 910.0* 840.0*   HbA1C: No results for input(s): HGBA1C in the last 72 hours. CBG: Recent Labs  Lab 10/29/18 1052  GLUCAP 206*   Lipid Profile: No results for input(s): CHOL, HDL, LDLCALC, TRIG, CHOLHDL, LDLDIRECT in the last 72 hours. Thyroid Function Tests: No results for input(s): TSH, T4TOTAL, FREET4, T3FREE, THYROIDAB in the last 72 hours. Anemia Panel: No results for input(s): VITAMINB12, FOLATE, FERRITIN, TIBC, IRON, RETICCTPCT in the last 72 hours. Sepsis Labs: Recent Labs   Lab 10/28/18 2044 10/28/18 2227 10/29/18 1318 10/29/18 1635  LATICACIDVEN 2.2* 2.3* 3.3* 3.2*    Recent Results (from the past 240 hour(s))  SARS CORONAVIRUS 2 (TAT 6-24 HRS) Nasopharyngeal Nasopharyngeal Swab     Status: None   Collection Time: 10/28/18  7:56 PM   Specimen: Nasopharyngeal Swab  Result Value Ref Range Status   SARS Coronavirus 2 NEGATIVE NEGATIVE Final    Comment: (NOTE) SARS-CoV-2 target nucleic acids are NOT DETECTED. The SARS-CoV-2 RNA is generally detectable in upper and lower respiratory specimens during the acute phase of infection. Negative results do not preclude SARS-CoV-2 infection, do not rule out co-infections with other pathogens, and should not be used as the sole basis for treatment or other patient management decisions. Negative results must be combined with clinical observations, patient history, and epidemiological information. The expected result is Negative. Fact Sheet for Patients: SugarRoll.be Fact Sheet for Healthcare Providers: https://www.woods-mathews.com/ This test is not yet approved or cleared by the [place] FDA and  has been authorized for detection and/or diagnosis of SARS-CoV-2 by FDA under an Emergency Use Authorization (EUA). This EUA will remain  in effect (meaning this test can be used) for the duration of the COVID-19 declaration under Section 56 4(b)(1) of the Act, 21 U.S.C. section 360bbb-3(b)(1), unless the authorization is terminated or revoked sooner. Performed at [facility], [place] [address] [place], [place] [address]          Radiology Studies: Ct Angio Chest Pe W Or Wo Contrast  Result Date: 10/30/2018 CLINICAL DATA:  73 year old female with shortness of breath. Evaluate for pulmonary embolism. EXAM: CT ANGIOGRAPHY CHEST WITH CONTRAST TECHNIQUE: Multidetector CT imaging of the chest was performed using the standard protocol during bolus administration of  intravenous contrast. Multiplanar CT image reconstructions and MIPs were obtained to evaluate the vascular anatomy. CONTRAST:  59m OMNIPAQUE IOHEXOL 350 MG/ML SOLN COMPARISON:  Chest CT dated 03/19/2018 FINDINGS: Evaluation is limited due to streak artifact caused by patient's  arms as well as due to cachexia. Cardiovascular: Moderate cardiomegaly. No pericardial effusion. Multi vessel coronary vascular calcification. There is retrograde flow of contrast from the right atrium into the IVC in keeping with right heart dysfunction. Correlation with echocardiogram recommended. There is moderate atherosclerotic calcification of the thoracic aorta. Evaluation of the aorta is limited due to suboptimal opacification. There is dilatation of the main pulmonary trunk suggestive of a degree of pulmonary hypertension. There is no CT evidence of pulmonary embolism. Mediastinum/Nodes: No hilar or mediastinal adenopathy. Multiple thyroid nodules noted. No mediastinal fluid collection. Lungs/Pleura: Severe centrilobular emphysema. Small left pleural effusion with subsegmental atelectasis of the left lower lobe. Right lung base linear atelectasis/scarring. There is no pneumothorax. The central airways are patent. Upper Abdomen: No acute abnormality. Musculoskeletal: No chest wall abnormality. No acute or significant osseous findings. There is loss of subcutaneous fat and cachexia. Review of the MIP images confirms the above findings. IMPRESSION: 1. No CT evidence of pulmonary embolism. 2. Moderate cardiomegaly with evidence of right heart dysfunction. Correlation with echocardiogram recommended. 3. Small left pleural effusion with subsegmental atelectasis of the left lower lobe. 4. Severe emphysema with findings of pulmonary hypertension. 5. Aortic Atherosclerosis (ICD10-I70.0) and Emphysema (ICD10-J43.9). Electronically Signed   By: Anner Crete M.D.   On: 10/30/2018 00:08   Dg Chest Portable 1 View  Result Date: 10/28/2018  CLINICAL DATA:  Chest pain and shortness of breath EXAM: PORTABLE CHEST 1 VIEW COMPARISON:  03/15/2018 FINDINGS: Cardiomegaly with central vascular congestion. Background emphysema suspected. No definite focal pneumonia, collapse or consolidation. Negative for edema, effusion or pneumothorax. Trachea midline. Aorta atherosclerotic. Degenerative changes of the spine with associated scoliosis. IMPRESSION: Cardiomegaly with vascular congestion Emphysema Atherosclerosis No significant interval change or superimposed acute process Electronically Signed   By: Jerilynn Mages.  Shick M.D.   On: 10/28/2018 20:15        Scheduled Meds: . aspirin EC  81 mg Oral Daily  . atorvastatin  10 mg Oral QHS  . [START ON 10/31/2018] enoxaparin (LOVENOX) injection  30 mg Subcutaneous Daily  . fluticasone furoate-vilanterol  1 puff Inhalation Daily   Or  . umeclidinium bromide  1 puff Inhalation Daily  . furosemide  40 mg Oral Daily  . insulin aspart  0-9 Units Subcutaneous TID WC  . ipratropium-albuterol  3 mL Nebulization TID  . isosorbide-hydrALAZINE  2 tablet Oral TID  . [START ON 10/31/2018] losartan  50 mg Oral Daily  . mirtazapine  15 mg Oral QHS  . pantoprazole  40 mg Oral Daily  . [START ON 10/31/2018] predniSONE  40 mg Oral Q breakfast  . sodium chloride flush  3 mL Intravenous Q12H   Continuous Infusions: . sodium chloride    . azithromycin Stopped (10/29/18 1527)     LOS: 2 days    Time spent: 35 minutes    Harold Hedge, DO [facility] Pager [phone]  If 7PM-7AM, please contact night-coverage [email or web address] Password [email or web address] 10/30/2018, 9:51 AM

## 2018-10-30 NOTE — Progress Notes (Signed)
   10/30/18 1428  Vitals  Pulse Rate 89  ECG Heart Rate (!) 113  Resp (!) 29  Oxygen Therapy  SpO2 (!) 88 %  O2 Device Nasal Cannula  O2 Flow Rate (L/min) 6 L/min  MEWS Score  MEWS RR 2  MEWS Pulse 2  MEWS Systolic 0  MEWS LOC 0  MEWS Temp 0  MEWS Score 4  MEWS Score Color Red    Weaning oxygen down to home dose but stats decreasing and heart rate increase. Titrated back up to 6 L Nasal Cannula. I will continue to monitor the patient closely.   Saddie Benders RN BSN

## 2018-10-30 NOTE — Progress Notes (Signed)
PT transported to CT and back with no complications

## 2018-10-30 NOTE — Consult Note (Addendum)
Advanced Heart Failure Team Consult Note   Primary Physician: [name], NP PCP-Cardiologist: Dr Aundra Dubin Pulmonary : Dr Melvyn Novas   Reason for Consultation: Heart Failure   HPI:    Denise [name] is seen today for evaluation of heart failure at the request of Dr Neysa Bonito.  Denise Derry A. Mudrick is a 73 y.o. female who has COPD on home oxygen, pulmonary HTN, suspected hypertrophic cardiomyopathy, HLD, DM2, Colon cancer s/p resection, hyperparathyroidism pending surgery, and HTN.   Admitted 9/15 - 09/24/17 for acute on chronic hypoxic respiratory failure. RHC at that time by Dr. Aundra Dubin showed significant pulmonary HTN. Thought to be most consistent with WHO group 3/COPD. Hospital course complicated by abdominal pain. GI work up with no clear cause.   11/01/17. PFTs that day with significant COPD.  She was hospitalized in 3/20 with PNA/COPD exacerbation and CHF exacerbation.  She was treated with antibiotics and diuresed.  RHC/LHC showed no coronary disease, normal filling pressures, moderate pulmonary hypertension.  V/Q scan showed no chronic PE.  High resolution CT showed moderate COPD, no interstitial lung disease.  Echo was concerning for hypertrophic CMP, so cardiac MRI was done.  LV EF 38%, moderate focal basal septal hypertrophy, mid-wall LGE in a noncoronary pattern suggestive of HCM.  On 10/202/20 she saw Dr Melvyn Novas and was started on prednisone for 6 days. Today she says she never started the prednisone.    Followed closely in the HF clinic and was last seen on 10/24/18. At that time she was stable from HF perspective.   Presented to [facility] on 10/26 with progressive shortness of breath. Uses 4 liters continuous oxygen at home but needed NRB in the ED. CXR was concerning for vascular congestion. Pertinent admission labs included: BNP 1582, lactic acid 2.2, WBC 6.9. HS Trop 239>221>229>221. Started on IV lasix, steroids, and bronchodilators.   Remains SOB with exertion.  Denies chest pain.   Echo 10/29/18  1. Left ventricular ejection fraction, by visual estimation, is 55 to 60%. The left ventricle has normal function. Normal left ventricular size. There is mildly increased left ventricular hypertrophy. Hyperefractile myocardium noted.  2. Left ventricular diastolic Doppler parameters are consistent with impaired relaxation pattern of LV diastolic filling.  3. Global right ventricle has normal systolic function.The right ventricular size is normal. No increase in right ventricular wall thickness.  4. Left atrial size was mildly dilated.  5. Right atrial size was mildly dilated.  6. Small pericardial effusion.  7. The pericardial effusion is circumferential.  8. The mitral valve is normal in structure. Trace mitral valve regurgitation.  9. The tricuspid valve is normal in structure. Tricuspid valve regurgitation is trivial. 10. The aortic valve is normal in structure. Aortic valve regurgitation is trivial by color flow Doppler. 11. The pulmonic valve was normal in structure. Pulmonic valve regurgitation is not visualized by color flow Doppler. 12. Mildly elevated pulmonary artery systolic pressure. 13. The tricuspid regurgitant velocity is 3.08 m/s, and with an assumed right atrial pressure of 3 mmHg, the estimated right ventricular systolic pressure is mildly elevated at 40.9 mmHg. 14. The inferior vena cava is normal in size with greater than 50% respiratory variability, suggesting right atrial pressure of 3 mmHg. 15. Consider cardiac amyloidosis.  Review of Systems: [y] = yes, _0  = no    General: Weight gain _1 ; Weight loss _2 ; Anorexia _3 ; Fatigue [ Y]; Fever _4 ; Chills _5 ; Weakness _6    Cardiac: Chest pain/pressure _7 ;  Resting SOB _0 ; Exertional SOB [ Y]; Orthopnea [Y ]; Pedal Edema _1 ; Palpitations _2 ; Syncope _3 ; Presyncope _4 ; Paroxysmal nocturnal dyspnea_5    Pulmonary: Cough _6 ; Wheezing_7 ; Hemoptysis_8 ; Sputum _9 ; Snoring _10     GI: Vomiting_11 ; Dysphagia_12 ; Melena_13 ; Hematochezia _14 ; Heartburn_15 ; Abdominal pain _16 ; Constipation _17 ; Diarrhea _18 ; BRBPR _19    GU: Hematuria_20 ; Dysuria _21 ; Nocturia_22    Vascular: Pain in legs with walking _23 ; Pain in feet with lying flat _24 ; Non-healing sores _25 ; Stroke _26 ; TIA _27 ; Slurred speech _28 ;   Neuro: Headaches_29 ; Vertigo_30 ; Seizures_31 ; Paresthesias_32 ;Blurred vision _33 ; Diplopia _34 ; Vision changes _35    Ortho/Skin: Arthritis _36 ; Joint pain [ Y]; Muscle pain _37 ; Joint swelling _38 ; Back Pain [Y ]; Rash _39    Psych: Depression_40 ; Anxiety_41    Heme: Bleeding problems _42 ; Clotting disorders _43 ; Anemia _44    Endocrine: Diabetes [Y ]; Thyroid dysfunction_45   Home Medications Prior to Admission medications   Medication Sig Start Date End Date Taking? Authorizing Provider  albuterol (VENTOLIN HFA) 108 (90 Base) MCG/ACT inhaler INHALE 1-2 PUFFS INTO THE LUNGS EVERY 4 HOURS AS NEEDED FOR WHEEZING OR SHORTNESS OF BREATH. Patient taking differently: Inhale 1-2 puffs into the lungs every 4 (four) hours as needed for wheezing or shortness of breath. INHALE 1-2 PUFFS INTO THE LUNGS EVERY 4 HOURS AS NEEDED FOR WHEEZING OR SHORTNESS OF BREATH. 04/30/18  Yes Parrett, Tammy S, NP  aspirin EC 81 MG EC tablet Take 1 tablet (81 mg total) by mouth daily. 03/22/18  Yes Mikhail, Maryann, DO  atorvastatin (LIPITOR) 10 MG tablet TAKE 1 TABLET BY MOUTH EVERYDAY AT BEDTIME Patient taking differently: Take 10 mg by mouth at bedtime.  07/22/18  Yes Baity, Coralie Keens, NP  Blood Glucose Calibration (ACCU-CHEK AVIVA) SOLN USE AS INSTRUCTED TO TEST BLOOD SUGAR DAILY Patient taking differently: 1 each by Other route daily.  05/28/18  Yes [name], NP  Blood Glucose Monitoring Suppl (ACCU-CHEK AVIVA PLUS) w/Device KIT Use as instructed to test blood sugar up to 3 times daily Patient taking differently: 1 each by Other route 3 (three) times daily.  05/28/18  Yes [name],  NP  Fluticasone-Umeclidin-Vilant (TRELEGY ELLIPTA) 100-62.5-25 MCG/INH AEPB Inhale 1 puff into the lungs daily. 10/24/18  Yes Tanda Rockers, MD  furosemide (LASIX) 40 MG tablet Take 1 tablet (40 mg total) by mouth daily. 10/03/18 10/03/19 Yes Larey Dresser, MD  glucose blood (ACCU-CHEK AVIVA PLUS) test strip Use as instructed to test blood sugar up to 3 times daily 05/28/18  Yes [name], NP  isosorbide-hydrALAZINE (BIDIL) 20-37.5 MG tablet Take 2 tablets by mouth 3 (three) times daily. 10/03/18  Yes Larey Dresser, MD  KLOR-CON M10 10 MEQ tablet TAKE 2 TABLETS (20 MEQ TOTAL) BY MOUTH DAILY. FOR LOW POTASSIUM. Patient taking differently: Take 10 mEq by mouth daily.  09/20/18  Yes Larey Dresser, MD  Lancets (ACCU-CHEK SOFT TOUCH) lancets Use as instructed to test blood sugar up to 3 times daily Patient taking differently: 1 each by Other route 3 (three) times daily.  05/28/18  Yes [name], NP  losartan (COZAAR) 100 MG tablet Take 50 mg by mouth daily.   Yes [provider]  mirtazapine (REMERON) 15 MG tablet TAKE 1 TABLET  BY MOUTH AT BEDTIME. FOR DEPRESSION AND APPETITE. Patient taking differently: Take 15 mg by mouth at bedtime.  09/25/18  Yes [name], NP  predniSONE (DELTASONE) 10 MG tablet Take 4 for three days 3 for three days 2 for three days 1 for three days and stop 10/22/18  Yes Tanda Rockers, MD  spironolactone (ALDACTONE) 25 MG tablet Take 1 tablet (25 mg total) by mouth daily. Take at night Patient taking differently: Take 25 mg by mouth every evening.  08/29/18  Yes Larey Dresser, MD  famotidine (PEPCID) 40 MG tablet TAKE 1 TABLET BY MOUTH TWICE A DAY Patient taking differently: Take 40 mg by mouth 2 (two) times daily.  07/11/18   Tanda Rockers, MD  omeprazole (PRILOSEC) 20 MG capsule Take 20 mg by mouth daily.    [provider]  OXYGEN Inhale 4 L into the lungs continuous.     [provider]  Polyethylene Glycol 3350  (MIRALAX PO) Take 17 g by mouth as needed.    [provider]  glipiZIDE (GLUCOTROL) 5 MG tablet Take 0.5 tablets (2.5 mg total) by mouth daily before breakfast. 03/22/18 04/26/18  Cristal Ford, DO    Past Medical History: Past Medical History:  Diagnosis Date   Altered mental status    Anxiety and depression    Chronic respiratory failure with hypoxia ([place]) 10/02/2017   Assoc with ? Cor pulmonale dx 09/2017 so rx 02 1-2 lpm 24/7  - 10/17/2017  Walked RA x 3 laps @ 185 ft each stopped due to  End of study, nl pace,  desat to 87 on 3rd lap - 12/19/2017  Saturations on Room Air at Rest =91 % and while Ambulating = 87%  But  on  2 Liters of pulsed oxygen while Ambulating =93% so rec POC 2lpm walking / use at rest if sats under 90%       Colon cancer ([place]) 1988   Resected   COPD (chronic obstructive pulmonary disease) ([place])    Diabetes mellitus without complication ([place])    diet controlled- no meds. per pt   Diarrhea 04/02/2018   Diverticulosis    Hyperlipidemia    Hypertension    Insomnia    Primary hyperparathyroidism ([place])    SBO (small bowel obstruction) ([place])    Tubular adenoma of rectum 02/23/2013   low grade   Vitamin D deficiency     Past Surgical History: Past Surgical History:  Procedure Laterality Date   ABDOMINAL HYSTERECTOMY  08/2015   BREAST BIOPSY Left    BREAST EXCISIONAL BIOPSY Right    COLON RESECTION  1980's   COLONOSCOPY     ENDOMETRIAL BIOPSY  2009   negative   INCONTINENCE SURGERY  2017   RIGHT HEART CATH N/A 09/20/2017   Procedure: RIGHT HEART CATH;  Surgeon: Larey Dresser, MD;  Location: [facility];  Service: Cardiovascular;  Laterality: N/A;   RIGHT/LEFT HEART CATH AND CORONARY ANGIOGRAPHY N/A 03/19/2018   Procedure: RIGHT/LEFT HEART CATH AND CORONARY ANGIOGRAPHY;  Surgeon: Larey Dresser, MD;  Location: [facility];  Service: Cardiovascular;  Laterality: N/A;    Family History: Family History    Problem Relation Age of Onset   Ovarian cancer Mother    Breast cancer Neg Hx    Hyperparathyroidism Neg Hx    Colon cancer Neg Hx    Esophageal cancer Neg Hx    Stomach cancer Neg Hx     Social History: Social  History   Socioeconomic History   Marital status: Married    Spouse name: Not on file   Number of children: 3   Years of education: Not on file   Highest education level: Not on file  Occupational History   Not on file  Social Needs   Financial resource strain: Not on file   Food insecurity    Worry: Not on file    Inability: Not on file   Transportation needs    Medical: Not on file    Non-medical: Not on file  Tobacco Use   Smoking status: Former Smoker    Packs/day: 1.00    Years: 30.00    Pack years: 30.00    Types: Cigarettes    Quit date: 1989    Years since quitting: 31.8   Smokeless tobacco: Never Used  Substance and Sexual Activity   Alcohol use: No   Drug use: No   Sexual activity: Yes    Partners: Male    Birth control/protection: Post-menopausal, Surgical  Lifestyle   Physical activity    Days per week: Not on file    Minutes per session: Not on file   Stress: Not on file  Relationships   Social connections    Talks on phone: Not on file    Gets together: Not on file    Attends religious service: Not on file    Active member of club or organization: Not on file    Attends meetings of clubs or organizations: Not on file    Relationship status: Not on file  Other Topics Concern   Not on file  Social History Narrative   Married. 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   Returned to [place] and [place] to be near children       Allergies:  No Known Allergies  Objective:    Vital Signs:   Temp:  [97.6 F (36.4 C)-98.4 F (36.9 C)] 97.6 F (36.4 C) (10/28 0500) Pulse Rate:  [66-129] 89 (10/28 0738) Resp:  [17-35] 22 (10/28 0738) BP: (101-155)/(70-118) 134/109 (10/28  0738) SpO2:  [92 %-100 %] 98 % (10/28 0738) FiO2 (%):  [30 %-44 %] 30 % (10/28 0737) Weight:  [47.2 kg] 47.2 kg (10/28 0500)    Weight change: Filed Weights   10/28/18 1930 10/30/18 0500  Weight: 48.4 kg 47.2 kg    Intake/Output:   Intake/Output Summary (Last 24 hours) at 10/30/2018 0954 Last data filed at 10/29/2018 2000 Gross per 24 hour  Intake 180 ml  Output 600 ml  Net -420 ml      Physical Exam    General:  Thin No resp difficulty. Sitting on the side of the bed.  HEENT: normal Neck: supple. JVP 5-6 . Carotids 2+ bilat; no bruits. No lymphadenopathy or thyromegaly appreciated. Cor: PMI nondisplaced. Regular rate & rhythm. No rubs, gallops or murmurs. Lungs: clear on 4 liters [place] Abdomen: soft, nontender, nondistended. No hepatosplenomegaly. No bruits or masses. Good bowel sounds. Extremities: no cyanosis, clubbing, rash, edema Neuro: alert & orientedx3, cranial nerves grossly intact. moves all 4 extremities w/o difficulty. Affect pleasant   Telemetry  SR-ST 80-100s   EKG    ST 102 bpm   Labs   Basic Metabolic Panel: Recent Labs  Lab 10/24/18 1206 10/28/18 2044 10/28/18 2057 10/29/18 0137 10/29/18 0257 10/29/18 1558 10/29/18 2151 10/30/18 0027  NA 145 140 140 138 139 140  --  139  K 4.4  3.6 3.6 3.8 3.7 3.7  --  4.0  CL 105 102  --   --  101  --   --  100  CO2 29 27  --   --  27  --   --  27  GLUCOSE 130* 175*  --   --  300*  --   --  285*  BUN 14 13  --   --  17  --   --  24*  CREATININE 1.14* 0.98  --   --  1.20*  --   --  1.28*  CALCIUM 11.1* 10.6*  --   --  10.3  --   --  10.6*  MG  --   --   --   --  1.5*  --  1.5*  --     Liver Function Tests: Recent Labs  Lab 10/28/18 2044  AST 32  ALT 24  ALKPHOS 83  BILITOT 0.7  PROT 6.0*  ALBUMIN 3.6   No results for input(s): LIPASE, AMYLASE in the last 168 hours. No results for input(s): AMMONIA in the last 168 hours.  CBC: Recent Labs  Lab 10/28/18 2044 10/28/18 2057 10/29/18 0137  10/29/18 1558  WBC 6.9  --   --   --   NEUTROABS 6.0  --   --   --   HGB 13.0 13.9 12.6 12.6  HCT 42.1 41.0 37.0 37.0  MCV 86.1  --   --   --   PLT 208  --   --   --     Cardiac Enzymes: No results for input(s): CKTOTAL, CKMB, CKMBINDEX, TROPONINI in the last 168 hours.  BNP: BNP (last 3 results) Recent Labs    03/15/18 2008 10/28/18 2044  BNP 1,914.0* 1,586.2*    ProBNP (last 3 results) Recent Labs    03/01/18 1218 05/02/18 1249 06/24/18 1111  PROBNP 1,336.0* 910.0* 840.0*     CBG: Recent Labs  Lab 10/29/18 1052  GLUCAP 206*    Coagulation Studies: Recent Labs    10/28/18 2317  LABPROT 14.1  INR 1.1     Imaging   Ct Angio Chest Pe W Or Wo Contrast  Result Date: 10/30/2018 CLINICAL DATA:  73 year old female with shortness of breath. Evaluate for pulmonary embolism. EXAM: CT ANGIOGRAPHY CHEST WITH CONTRAST TECHNIQUE: Multidetector CT imaging of the chest was performed using the standard protocol during bolus administration of intravenous contrast. Multiplanar CT image reconstructions and MIPs were obtained to evaluate the vascular anatomy. CONTRAST:  20m OMNIPAQUE IOHEXOL 350 MG/ML SOLN COMPARISON:  Chest CT dated 03/19/2018 FINDINGS: Evaluation is limited due to streak artifact caused by patient's arms as well as due to cachexia. Cardiovascular: Moderate cardiomegaly. No pericardial effusion. Multi vessel coronary vascular calcification. There is retrograde flow of contrast from the right atrium into the IVC in keeping with right heart dysfunction. Correlation with echocardiogram recommended. There is moderate atherosclerotic calcification of the thoracic aorta. Evaluation of the aorta is limited due to suboptimal opacification. There is dilatation of the main pulmonary trunk suggestive of a degree of pulmonary hypertension. There is no CT evidence of pulmonary embolism. Mediastinum/Nodes: No hilar or mediastinal adenopathy. Multiple thyroid nodules noted. No  mediastinal fluid collection. Lungs/Pleura: Severe centrilobular emphysema. Small left pleural effusion with subsegmental atelectasis of the left lower lobe. Right lung base linear atelectasis/scarring. There is no pneumothorax. The central airways are patent. Upper Abdomen: No acute abnormality. Musculoskeletal: No chest wall abnormality. No acute or significant osseous findings. There is  loss of subcutaneous fat and cachexia. Review of the MIP images confirms the above findings. IMPRESSION: 1. No CT evidence of pulmonary embolism. 2. Moderate cardiomegaly with evidence of right heart dysfunction. Correlation with echocardiogram recommended. 3. Small left pleural effusion with subsegmental atelectasis of the left lower lobe. 4. Severe emphysema with findings of pulmonary hypertension. 5. Aortic Atherosclerosis (ICD10-I70.0) and Emphysema (ICD10-J43.9). Electronically Signed   By: Anner Crete M.D.   On: 10/30/2018 00:08      Medications:     Current Medications:  aspirin EC  81 mg Oral Daily   atorvastatin  10 mg Oral QHS   [START ON 10/31/2018] enoxaparin (LOVENOX) injection  30 mg Subcutaneous Daily   fluticasone furoate-vilanterol  1 puff Inhalation Daily   Or   umeclidinium bromide  1 puff Inhalation Daily   furosemide  40 mg Oral Daily   insulin aspart  0-9 Units Subcutaneous TID WC   ipratropium-albuterol  3 mL Nebulization TID   isosorbide-hydrALAZINE  2 tablet Oral TID   [START ON 10/31/2018] losartan  50 mg Oral Daily   mirtazapine  15 mg Oral QHS   pantoprazole  40 mg Oral Daily   [START ON 10/31/2018] predniSONE  40 mg Oral Q breakfast   sodium chloride flush  3 mL Intravenous Q12H     Infusions:  sodium chloride     azithromycin Stopped (10/29/18 1527)        Assessment/Plan   1. Acute/Chronic Hypoxic Respiratory Failure Multifactorial with COPD + HF.  Initially on NRB . Weaned to 4 liters [place] with stable oxygen saturations.  On 4 liters  chronically.   2. Acute/Chronic Diastolic Heart Failure ECHO this admit EF 55-60% with concern for amyloidosis- Previous PYP scan was not suggestive of TTR. CMRI 03/2018 concerning for HOCM and less likely cardiac amyloidosis.   CXR wet. BNP elevated. Receiving IV lasix. . Volume status improving. Given another dose IV lasix today then would stop.  Tomorrow anticipate starting lasix 60 mg daily. At home she was taking lasix 40 mg daily..  Continue current dose bidil.  Restart losartan and spiro tomorrow.   3. AECOPD  Per primary team. On bronchodilators and steroids.   4. Pulmonary HTN RHC showed moderate pulmonary arterial hypertension with high PVR (8.6 WU). V/Q scan (9/19) did not show chronic PE. CT chest high resolution showed no ILD, there was moderate emphysema. Pulmonary hypertension seems to be out of proportion to degree of lung disease. Suspected mixed group 1 (idiopathic PAH) and group 3 PH. Serologies sent and came back negative other thanRF elevated (CCP negative). HIV negative. She has been on amlodipine in the past without improvement.  She was tried on Tyvaso (to limit V/Q mismatching in the setting of lung parenchymal disease), but she felt worse on this med it was stopped.   5.Hypertrophic cardiomyopathy: Imaging has been concerning for HCM without LVOT obstruction or SAM.  No family history of HCM or sudden death.  Length of Stay: 2  Darrick Grinder, NP  10/30/2018, 9:54 AM  Advanced Heart Failure Team Pager [phone] (M-F; 7a - 4p)  Please contact [facility] for night-coverage after hours (4p -7a ) and weekends on [email or web address]  Patient seen with NP, agree with the above note.   She was admitted with acute on chronic hypoxemic respiratory failure, currently on Bipap. She has severe COPD but also CHF.  She has only received po Lasix today.   She is also on prednisone.  CTA chest showed no PE,  severe COPD.  She is in NSR with frequent PACs.   General: Thin, on Bipap.   Neck: JVP 10 cm, no thyromegaly or thyroid nodule.  Lungs: Distant BS. CV: Nondisplaced PMI.  Heart mildly irregular S1/S2, no S3/S4, no murmur.  No peripheral edema.  No carotid bruit.  Difficult to palpate pedal pulses.  Abdomen: Soft, nontender, no hepatosplenomegaly, no distention.  Skin: Intact without lesions or rashes.  Neurologic: Alert and oriented x 3.  Psych: Normal affect. Extremities: No clubbing or cyanosis.  HEENT: Normal.   1. Acute on chronic hypoxemic respiratory failure: Suspect COPD exacerbation is present, she has severe COPD on home oxygen.  However, she also likely has a component of acute diastolic CHF.  Some volume overload on exam.  - Continue prednisone, nebs per primary service.  - Lasix 40 mg IV bid for now, follow I/Os.  2. Cardiomyopathy: Echo this admission shows improved LV systolic function, EF up to 55-60%.  RV read as normal. There is a question of cardiac amyloidosis.  She has had an extensive workup of this, PYP scan negative and myeloma panel negative in the past.  As noted on prior cardiac MRI, I think that she has a variant of hypertrophic cardiomyopathy.  3. Pulmonary hypertension: Suspect group 3 from COPD. She did not tolerate trial of Tyvaso.   Loralie Champagne 10/30/2018 3:52 PM

## 2018-10-30 NOTE — Telephone Encounter (Signed)
Mr. Stines called and said that Denise [name] is in the hospital because she became sob over the weekend.  He said that he'll call me and let me know when she's discharged.

## 2018-10-30 NOTE — Progress Notes (Signed)
10/30/18 1747  Vitals  ECG Heart Rate 96  Resp (!) 29  Oxygen Therapy  SpO2 95 %  O2 Device Nasal Cannula  O2 Flow Rate (L/min) 6 L/min  MEWS Score  MEWS RR 2  MEWS Pulse 0  MEWS Systolic 0  MEWS LOC 0  MEWS Temp 0  MEWS Score 2  MEWS Score Color Yellow   Patient removed Bipap for dinner. RN to bedside placed on 6L . I will continue to monitor the patient closely.   Saddie Benders RN BSN

## 2018-10-30 NOTE — Progress Notes (Signed)
   10/30/18 1335  Vitals  Pulse Rate 88  ECG Heart Rate 96  Resp 16  Oxygen Therapy  SpO2 97 %  O2 Device Nasal Cannula  O2 Flow Rate (L/min) 6 L/min  MEWS Score  MEWS RR 0  MEWS Pulse 0  MEWS Systolic 0  MEWS LOC 0  MEWS Temp 0  MEWS Score 0  MEWS Score Color Green   Patient transitioned to 6L nasal cannula. VVS. Eating lunch sitting up in bed. I will continue to monitor closely.   Saddie Benders RN BSN

## 2018-10-30 NOTE — Progress Notes (Signed)
Pt had 16 beats of Vtach. Pt sleeping, no distress noted

## 2018-10-31 ENCOUNTER — Encounter (HOSPITAL_COMMUNITY): Payer: Self-pay | Admitting: General Practice

## 2018-10-31 DIAGNOSIS — I5033 Acute on chronic diastolic (congestive) heart failure: Secondary | ICD-10-CM

## 2018-10-31 LAB — BASIC METABOLIC PANEL
Anion gap: 10 (ref 5–15)
BUN: 23 mg/dL (ref 8–23)
CO2: 33 mmol/L — ABNORMAL HIGH (ref 22–32)
Calcium: 10.9 mg/dL — ABNORMAL HIGH (ref 8.9–10.3)
Chloride: 98 mmol/L (ref 98–111)
Creatinine, Ser: 1.08 mg/dL — ABNORMAL HIGH (ref 0.44–1.00)
GFR calc Af Amer: 59 mL/min — ABNORMAL LOW (ref >60)
GFR calc non Af Amer: 51 mL/min — ABNORMAL LOW (ref >60)
Glucose, Bld: 135 mg/dL — ABNORMAL HIGH (ref 70–99)
Potassium: 3.6 mmol/L (ref 3.5–5.1)
Sodium: 141 mmol/L (ref 135–145)

## 2018-10-31 LAB — MAGNESIUM: Magnesium: 2.1 mg/dL (ref 1.7–2.4)

## 2018-10-31 LAB — KAPPA/LAMBDA LIGHT CHAINS
Kappa free light chain: 20.9 mg/L — ABNORMAL HIGH (ref 3.3–19.4)
Kappa, lambda light chain ratio: 1.77 — ABNORMAL HIGH (ref 0.26–1.65)
Lambda free light chains: 11.8 mg/L (ref 5.7–26.3)

## 2018-10-31 LAB — GLUCOSE, CAPILLARY
Glucose-Capillary: 107 mg/dL — ABNORMAL HIGH (ref 70–99)
Glucose-Capillary: 235 mg/dL — ABNORMAL HIGH (ref 70–99)
Glucose-Capillary: 224 mg/dL — ABNORMAL HIGH (ref 70–99)
Glucose-Capillary: 322 mg/dL — ABNORMAL HIGH (ref 70–99)

## 2018-10-31 MED ORDER — DOXYCYCLINE HYCLATE 100 MG PO TABS
100.0000 mg | ORAL_TABLET | Freq: Two times a day (BID) | ORAL | Status: DC
  Administered 2018-10-31 – 2018-11-02 (×5): 100 mg via ORAL
  Filled 2018-10-31 (×5): qty 1

## 2018-10-31 MED ORDER — AMIODARONE LOAD VIA INFUSION
150.0000 mg | Freq: Once | INTRAVENOUS | Status: AC
  Administered 2018-10-31: 10:00:00 150 mg via INTRAVENOUS
  Filled 2018-10-31: qty 83.34

## 2018-10-31 MED ORDER — AMIODARONE HCL IN DEXTROSE 360-4.14 MG/200ML-% IV SOLN
30.0000 mg/h | INTRAVENOUS | Status: DC
  Administered 2018-10-31 (×2): 30 mg/h via INTRAVENOUS
  Filled 2018-10-31: qty 200

## 2018-10-31 MED ORDER — LORAZEPAM 2 MG/ML IJ SOLN
0.5000 mg | Freq: Every evening | INTRAMUSCULAR | Status: DC | PRN
  Administered 2018-11-02: 0.5 mg via INTRAVENOUS
  Filled 2018-10-31: qty 1

## 2018-10-31 MED ORDER — AMIODARONE HCL IN DEXTROSE 360-4.14 MG/200ML-% IV SOLN
60.0000 mg/h | INTRAVENOUS | Status: DC
  Administered 2018-10-31 (×2): 60 mg/h via INTRAVENOUS
  Filled 2018-10-31 (×2): qty 200

## 2018-10-31 MED ORDER — POTASSIUM CHLORIDE CRYS ER 20 MEQ PO TBCR
40.0000 meq | EXTENDED_RELEASE_TABLET | Freq: Once | ORAL | Status: AC
  Administered 2018-10-31: 10:00:00 40 meq via ORAL
  Filled 2018-10-31: qty 2

## 2018-10-31 MED ORDER — DOXYCYCLINE HYCLATE 100 MG PO TABS: 100.0000 mg | ORAL_TABLET | Freq: Two times a day (BID) | ORAL | Status: DC

## 2018-10-31 MED ORDER — APIXABAN 5 MG PO TABS: 5.0000 mg | ORAL_TABLET | Freq: Two times a day (BID) | ORAL | Status: DC

## 2018-10-31 MED ORDER — ENSURE ENLIVE PO LIQD
237.0000 mL | Freq: Two times a day (BID) | ORAL | Status: DC
  Administered 2018-10-31 – 2018-11-01 (×3): 237 mL via ORAL

## 2018-10-31 NOTE — Progress Notes (Addendum)
Advanced Heart Failure Rounding Note  PCP-Cardiologist: Dr. Aundra Dubin  Subjective:    Admitted for acute on chronic hypoxic respiratory failure, in the setting of acute COPDE and a/c dCHF.   Feels a bit better bur remains on high flow 02.  Reports good urinary response to IV Lasix. Had incontinence overnight, thus I/Os not accurate. Wt down 2 lb, 106>>104. Usual home wt ~102-108  8 beat run of NSVT overnight. Pt symptomatic w/ palpitations. No dizziness, syncope/ near syncope   Objective:   Weight Range: 47.6 kg Body mass index is 16.94 kg/m.   Vital Signs:   Temp:  [97.8 F (36.6 C)-98.2 F (36.8 C)] 98.2 F (36.8 C) (10/29 0604) Pulse Rate:  [44-120] 77 (10/29 0348) Resp:  [16-42] 25 (10/29 0348) BP: (105-144)/(59-98) 125/78 (10/29 0104) SpO2:  [76 %-100 %] 97 % (10/29 0348) FiO2 (%):  [30 %-36 %] 30 % (10/29 0348) Weight:  [47.6 kg] 47.6 kg (10/29 0604)    Weight change: Filed Weights   10/28/18 1930 10/30/18 0500 10/31/18 0604  Weight: 48.4 kg 47.2 kg 47.6 kg    Intake/Output:   Intake/Output Summary (Last 24 hours) at 10/31/2018 0740 Last data filed at 10/31/2018 0500 Gross per 24 hour  Intake 950 ml  Output 1850 ml  Net -900 ml      Physical Exam    General:  Frail appearing elderly AAF. No resp difficulty HEENT: Normal Neck: Supple. JVP . Carotids 2+ bilat; no bruits. No lymphadenopathy or thyromegaly appreciated. Cor: PMI nondisplaced. Irregular, frequent ectopy. No rubs, gallops or murmurs. Lungs: decreased BS bilaterally  Abdomen: Soft, nontender, nondistended. No hepatosplenomegaly. No bruits or masses. Good bowel sounds. Extremities: No cyanosis, clubbing, rash, edema Neuro: Alert & orientedx3, cranial nerves grossly intact. moves all 4 extremities w/o difficulty. Affect pleasant   Telemetry   SR w/ PACs and PVCs, 8 beat run of NSVT 10/28  EKG    12 lead pending to assess QT interval   Labs    CBC Recent Labs    10/28/18 2044   10/29/18 1558 10/30/18 1520  WBC 6.9  --   --  9.4  NEUTROABS 6.0  --   --   --   HGB 13.0   < > 12.6 13.2  HCT 42.1   < > 37.0 42.2  MCV 86.1  --   --  84.1  PLT 208  --   --  263   < > = values in this interval not displayed.   Basic Metabolic Panel Recent Labs    10/29/18 2151 10/30/18 0027 10/30/18 1013 10/31/18 0440  NA  --  139  --  141  K  --  4.0  --  3.6  CL  --  100  --  98  CO2  --  27  --  33*  GLUCOSE  --  285*  --  135*  BUN  --  24*  --  23  CREATININE  --  1.28*  --  1.08*  CALCIUM  --  10.6*  --  10.9*  MG 1.5*  --  2.7*  --    Liver Function Tests Recent Labs    10/28/18 2044  AST 32  ALT 24  ALKPHOS 83  BILITOT 0.7  PROT 6.0*  ALBUMIN 3.6   No results for input(s): LIPASE, AMYLASE in the last 72 hours. Cardiac Enzymes No results for input(s): CKTOTAL, CKMB, CKMBINDEX, TROPONINI in the last 72 hours.  BNP: BNP (last  3 results) Recent Labs    03/15/18 2008 10/28/18 2044  BNP 1,914.0* 1,586.2*    ProBNP (last 3 results) Recent Labs    03/01/18 1218 05/02/18 1249 06/24/18 1111  PROBNP 1,336.0* 910.0* 840.0*     D-Dimer Recent Labs    10/29/18 1522  DDIMER 1.89*   Hemoglobin A1C No results for input(s): HGBA1C in the last 72 hours. Fasting Lipid Panel No results for input(s): CHOL, HDL, LDLCALC, TRIG, CHOLHDL, LDLDIRECT in the last 72 hours. Thyroid Function Tests No results for input(s): TSH, T4TOTAL, T3FREE, THYROIDAB in the last 72 hours.  Invalid input(s): FREET3  Other results:   Imaging    Dg Chest Port 1 View  Result Date: 10/30/2018 CLINICAL DATA:  Shortness of breath. EXAM: PORTABLE CHEST 1 VIEW COMPARISON:  10/28/2018 FINDINGS: Heart size upper limits of normal. Chronic aortic atherosclerosis. The lungs are clear. The vascularity is normal. No effusions. No acute bone finding. IMPRESSION: No active disease. Chronic aortic atherosclerosis. Heart size upper limits of normal. Electronically Signed   By: Nelson Chimes M.D.   On: 10/30/2018 17:01      Medications:     Scheduled Medications: . aspirin EC  81 mg Oral Daily  . atorvastatin  10 mg Oral QHS  . enoxaparin (LOVENOX) injection  30 mg Subcutaneous Daily  . fluticasone furoate-vilanterol  1 puff Inhalation Daily   Or  . umeclidinium bromide  1 puff Inhalation Daily  . furosemide  40 mg Intravenous BID  . insulin aspart  0-9 Units Subcutaneous TID WC  . ipratropium-albuterol  3 mL Nebulization TID  . isosorbide-hydrALAZINE  2 tablet Oral TID  . losartan  50 mg Oral Daily  . mirtazapine  15 mg Oral QHS  . pantoprazole  40 mg Oral Daily  . predniSONE  40 mg Oral Q breakfast  . sodium chloride flush  3 mL Intravenous Q12H     Infusions: . sodium chloride    . azithromycin 500 mg (10/30/18 1402)     PRN Medications:  sodium chloride, acetaminophen, albuterol, ALPRAZolam, dextromethorphan-guaiFENesin, hydrALAZINE, polyethylene glycol, sodium chloride flush    Patient Profile   Denise A. Richardsonis a 73 y.o.femalewho has severe COPD on home oxygen, pulmonary HTN, suspected hypertrophic cardiomyopathy, HLD, DM2, Colon cancer s/p resection, hyperparathyroidism pending surgery, and HTN admitted for acute on chronic hypoxic respiratory failure, in the setting of acute COPDE and a/c dCHF.   Assessment/Plan   1. Acute on chronic hypoxemic respiratory failure: Suspect COPD exacerbation is present, she has severe COPD on home oxygen.  However, she also likely has a component of acute diastolic CHF.  - COPDE>>Continue prednisone, nebs per primary service.  - A/C dCHF>Volume overloaded on admit and started on IV Lasix, 40 bid. Pt reports good urinary output. Only 1.8 L in UOP documented, but not accurate due to some incontinence. Wt down 2 lb, 106>>104.  SCr improving w/ diuresis, down from 1.28>>1.08. K 3.6. Give another dose of IV lasix this AM. Likely transition back to PO in next 24 hrs. 2. Cardiomyopathy: Echo this admission  shows improved LV systolic function, EF up to 55-60%.  RV read as normal. There is a question of cardiac amyloidosis.  She has had an extensive workup of this, PYP scan negative and myeloma panel negative in the past.  As noted on prior cardiac MRI, suspect that she has a variant of hypertrophic cardiomyopathy.  3. Pulmonary hypertension: Suspect group 3 from COPD. She did not tolerate trial of Tyvaso.  4. NSVT: 8 beat run overnight. Symptomatic w/ palpitations. No dizziness, syncope/ near syncope. EF normal by echo but ? HCM. Also ? Prolonged QT. On azithromycin and Remeron (both can prolong QT). Will check 12 lead EKG to assess QT and check Mg level. K 3.6 this AM. Plan to give another dose of IV Lasix. Will supp K. Keep K >4.0 and Mg >2.0.   Length of Stay: [address], PA-C  10/31/2018, 7:40 AM  Advanced Heart Failure Team Pager [phone] (M-F; 7a - 4p)  Please contact [facility] for night-coverage after hours (4p -7a ) and weekends on [email or web address]  Patient seen with PA, agree with the above note.   She says that she feels better this morning. She seems to have diuresed well yesterday though she was incontinent and I/Os not accurate.    This morning, she has been tachycardic.  On review of telemetry, I do not think that this is atrial fibrillation, suspect multifocal atrial tachycardia (MAT).    General: NAD Neck: JVP 9-10 cm, no thyromegaly or thyroid nodule.  Lungs: Distant BS.  CV: Nondisplaced PMI.  Heart tachy, irregular S1/S2, no S3/S4, no murmur.  No peripheral edema.   Abdomen: Soft, nontender, no hepatosplenomegaly, no distention.  Skin: Intact without lesions or rashes.  Neurologic: Alert and oriented x 3.  Psych: Normal affect. Extremities: No clubbing or cyanosis.  HEENT: Normal.   1. Acute on chronic hypoxemic respiratory failure: Now back on 6 L [place].  Suspect AECOPD but also with volume overload suggesting acute on chronic diastolic CHF.  She diuresed  yesterday, feels better.  - Continue steroids, nebs, and doxycycline per primary team for treatment of COPD (on home oxygen at baseline).  - Would continue IV diuresis for 1 more day. Creatinine stable.  2.Acute on chronic systolic => diastolic CHF: With prominent RV failure. Echo 3/20 with EF 45%, asymmetric septal hypertrophy, moderate RV systolic dysfunction. RHC/LHC was done in 3/20, showing no significant CAD and moderate pulmonary arterial hypertension. Cardiac MRI showed LV EF 38%, moderate basal septal hypertrophy, RV EF 30%, mid-wall LGE in the basal anteroseptal wall and lesser extent in mid lateral wall. Most concerning for hypertrophic CMP, less likely cardiac amyloidosis.  PYP scan was negative for evidence of transthyretin amyloidosis and myeloma panel negative. She does not have a known family history of HCM.  Most recent echo done this admission showed EF back up to 55-60%.  Breathing better today, think still with mild volume overload.  -Continue Lasix 40 mg IV bid x 1 more day.   - Continue losartan 50 mg daily.  - Continue Bidil 2 tabs tid with markedly elevated BP.  3. Hypertrophic cardiomyopathy: Imaging has been concerning for HCM without LVOT obstruction or SAM.  No family history of HCM or sudden death.  4. Pulmonary hypertension: RHC showed moderate pulmonary arterial hypertension with high PVR (8.6 WU). V/Q scan (9/19) did not show chronic PE. CT chest high resolution showed no ILD, there was moderate emphysema. Pulmonary hypertension seems to be out of proportion to degree of lung disease. I suspected mixed group 1 (idiopathic PAH) and group 3 PH. Serologies sent and came back negative other thanRF elevated (CCP negative). HIV negative. She has been on amlodipine in the past without improvement.  I tried her on Tyvaso (to limit V/Q mismatching in the setting of lung parenchymal disease), but she felt worse on this med and stopped it.  I suspect that her pulmonary  hypertension is primarily  group 3 from COPD. Would hold off on further pulmonary vasodilators.  5. HTN: BP controlled on current regimen.  6. Multifocal atrial tachycardia: Suspect this is related to her baseline lung disease.  HR elevated today.  - Control with amiodarone gtt for now.  - She does not need anticoagulation for MAT.   Loralie Champagne 10/31/2018 11:09 AM

## 2018-10-31 NOTE — Progress Notes (Signed)
Pt tachycardia, tachypneac, MD notified.

## 2018-10-31 NOTE — Progress Notes (Addendum)
PROGRESS NOTE    Denise Derry A. Marvel Plan   QAS:[id number] DOB: 11/21/1945 DOA: 10/28/2018  PCP: [name], NP   [facility] Summary  Denise Derry A. Richardsonis a 73 y.o.femalewith medical history significant ofHTN, HLD,former smoker,diet-controled DM, COPD,chronic respiratory failureon 4L nasal cannula oxygen at home, cor pulmonale, GERD, depression, primary hyperparathyroidism,sCHF with EF 45%, colon cancer, pre on 10/28/2018 with shortness of breath for last few days which have been progressively worsening.  Worse with exertion.  Some dry cough but no chest pain, fever or chills.  Upon arrival to ED, she was hypoxic and was found to be using accessory respiratory muscles and was placed on nonrebreather.  Although her chest x-ray did not show any pulmonary edema however she was diagnosed with acute COPD exacerbation and acute on chronic systolic congestive heart failure since she had elevated BNP.  She was started on Solu-Medrol and IV Lasix and was admitted under hospitalist service. Overnight 10/27-10/28, patient had respiratory distress, placed on Bipap and started on empiric Heparin for concern of PE. Had negative CTA chest for PE. Echo showed small circumfrential pericardial effusion and findings concerning for amyloid, cardiology was consulted.   A & P   Principal Problem:   Chronic respiratory failure with hypoxia and hypercapnia ([name]) Active Problems:   Hyperlipidemia   Hyperparathyroidism, primary ([place])   Pulmonary hypertension ([name]) c/w cor pulmonale   COPD exacerbation ([name])   Acute on chronic systolic CHF (congestive heart failure) ([name])   Elevated troponin   Lactic acidosis   GERD (gastroesophageal reflux disease)   Acute on chronic respiratory failure with hypercapnia ([name])   Diabetes mellitus without complication-diet controled  Multifocal atrial tachycardia related to lung disease  Controlled with amnio drip, started by cardiology  Treat underlying disorder   No need for anticoagulation  Acute on chronic hypoxemic respiratory failure Likely COPD exacerbation with Diastolic HF exacerbation. On 6 L [place], baseline 4 L. On prednisone and nebulizers as well as diuresing per cardiology. Wt 48->47 kg. QTc 435 ms today on Remeron and Azithromycin.  Feels a bit better but still requiring O2 above baseline . Continue IV lasix  . PO prednisone 40 mg for total 5 days steroids  . Will switch Azithromycin to Doxycycline to avoid prolonged QTc . Continue nebulizers/inhalers: Incruse Ellipta, Breo Ellipta  Elevated troponin Right/left heart cath 03/19/2018 with near normal filling pressures, moderate PAH, low cardiac output and mild luminal irregularities on coronary angiogram. No chest pain. EKG with episodes of tachycardia and frequent PVCs, received IV metoprolol yesterday. Likely demand ischemia.  Cardio on board  Cardiomyopathy Echo concerning for cardiac amyloidosis, per cardio, patient has had extensive negative workup for cardiac amyloid. Concern for a variant of hypertrophic cardiomyopathy as noted on prior cardiac mri.  Hyperlipidemia continue lipitor  Lactic acidosis Likely secondary to albuterol neb, unlikely sepsis. venous lactic acid 3.3.  No sign of infection, chest x-ray without sign of active disease   . monitor  AKI on CKD 3 Cr 1.28->1.08 after holding losartan and diuresing, baseline 0.7-0.9.  . Monitor  Hypomagnesemia repeat lab today and replace as necessary  Diastolic heart failure compensated. Chronically elevated BNP likely related to other chronic issues. . Continue meds as above  Hypertension stable . Continue losartan, Lasix, BiDil per cardiology  GERD Continue PPI  Type 2 Diabetes with hyperglycemia in setting of steroids. . Start SSI  DVT prophylaxis: Lovenox   Code Status: Full Code  Discussed with husband over the phone Diet: 2g sodium restriction Disposition Plan: Medical  stability  Consultants  . Cardiology   Procedures  . none  Antibiotics  Day 3/5 of antibiotics, azithromycin changed to doxycycline for COPD      Subjective   Patient evaluated today at bedside.  Multiple nurses at bedside on my arrival.  Received signout from nurse that patient had gone to a rapid irregular heart rhythm and cardiology was notified prior to my arrival and patient was started on amiodarone drip.  Patient currently stating she was having improving symptoms since amiodarone started and having improving shortness of breath and palpitations.  She denied any chest pain or pressure or discomfort.  No other complaints   Objective   Vitals:   10/31/18 0348 10/31/18 0604 10/31/18 0800 10/31/18 0850  BP:   (!) 125/108   Pulse: 77  (!) 110   Resp: (!) 25  (!) 26   Temp:  98.2 F (36.8 C) 98.2 F (36.8 C)   TempSrc:  Oral Oral   SpO2: 97%  94% 92%  Weight:  47.6 kg    Height:        Intake/Output Summary (Last 24 hours) at 10/31/2018 0939 Last data filed at 10/31/2018 0800 Gross per 24 hour  Intake 950 ml  Output 1650 ml  Net -700 ml   Filed Weights   10/28/18 1930 10/30/18 0500 10/31/18 0604  Weight: 48.4 kg 47.2 kg 47.6 kg    Examination:  Physical Exam Vitals signs and nursing note reviewed.  Constitutional:      General: She is in acute distress.     Appearance: Normal appearance.     Comments: frail clearly female Clinical status improving on amnio drip during exam  HENT:     Head: Normocephalic and atraumatic.     Comments: Bipap on Eyes:     Extraocular Movements: Extraocular movements intact.  Neck:     Musculoskeletal: Normal range of motion. No neck rigidity.  Cardiovascular:     Rate and Rhythm: Tachycardia present. Rhythm irregular.  Pulmonary:     Effort: Tachypnea present.     Breath sounds: No decreased breath sounds or wheezing.  Abdominal:     General: Abdomen is flat.     Palpations: Abdomen is soft.  Musculoskeletal: Normal range of motion.        General: No  swelling.  Neurological:     General: No focal deficit present.     Mental Status: She is alert. Mental status is at baseline.     Comments: Oriented and answering questions appropriately  Psychiatric:        Mood and Affect: Mood normal.        Behavior: Behavior normal.     Data Reviewed: I have personally reviewed following labs and imaging studies  CBC: Recent Labs  Lab 10/28/18 2044 10/28/18 2057 10/29/18 0137 10/29/18 1558 10/30/18 1520  WBC 6.9  --   --   --  9.4  NEUTROABS 6.0  --   --   --   --   HGB 13.0 13.9 12.6 12.6 13.2  HCT 42.1 41.0 37.0 37.0 42.2  MCV 86.1  --   --   --  84.1  PLT 208  --   --   --  676   Basic Metabolic Panel: Recent Labs  Lab 10/24/18 1206 10/28/18 2044  10/29/18 0137 10/29/18 0257 10/29/18 1558 10/29/18 2151 10/30/18 0027 10/30/18 1013 10/31/18 0440  NA 145 140   < > 138 139 140  --  139  --  141  K 4.4 3.6   < > 3.8 3.7 3.7  --  4.0  --  3.6  CL 105 102  --   --  101  --   --  100  --  98  CO2 29 27  --   --  27  --   --  27  --  33*  GLUCOSE 130* 175*  --   --  300*  --   --  285*  --  135*  BUN 14 13  --   --  17  --   --  24*  --  23  CREATININE 1.14* 0.98  --   --  1.20*  --   --  1.28*  --  1.08*  CALCIUM 11.1* 10.6*  --   --  10.3  --   --  10.6*  --  10.9*  MG  --   --   --   --  1.5*  --  1.5*  --  2.7*  --    < > = values in this interval not displayed.   GFR: Estimated Creatinine Clearance: 34.9 mL/min (A) (by C-G formula based on SCr of 1.08 mg/dL (H)). Liver Function Tests: Recent Labs  Lab 10/28/18 2044  AST 32  ALT 24  ALKPHOS 83  BILITOT 0.7  PROT 6.0*  ALBUMIN 3.6   No results for input(s): LIPASE, AMYLASE in the last 168 hours. No results for input(s): AMMONIA in the last 168 hours. Coagulation Profile: Recent Labs  Lab 10/28/18 2317  INR 1.1   Cardiac Enzymes: No results for input(s): CKTOTAL, CKMB, CKMBINDEX, TROPONINI in the last 168 hours. BNP (last 3 results) Recent Labs    03/01/18  1218 05/02/18 1249 06/24/18 1111  PROBNP 1,336.0* 910.0* 840.0*   HbA1C: No results for input(s): HGBA1C in the last 72 hours. CBG: Recent Labs  Lab 10/29/18 1052 10/30/18 1723 10/30/18 2119 10/31/18 0811  GLUCAP 206* 247* 166* 107*   Lipid Profile: No results for input(s): CHOL, HDL, LDLCALC, TRIG, CHOLHDL, LDLDIRECT in the last 72 hours. Thyroid Function Tests: No results for input(s): TSH, T4TOTAL, FREET4, T3FREE, THYROIDAB in the last 72 hours. Anemia Panel: No results for input(s): VITAMINB12, FOLATE, FERRITIN, TIBC, IRON, RETICCTPCT in the last 72 hours. Sepsis Labs: Recent Labs  Lab 10/28/18 2227 10/29/18 1318 10/29/18 1635 10/30/18 1520  LATICACIDVEN 2.3* 3.3* 3.2* 3.3*    Recent Results (from the past 240 hour(s))  SARS CORONAVIRUS 2 (TAT 6-24 HRS) Nasopharyngeal Nasopharyngeal Swab     Status: None   Collection Time: 10/28/18  7:56 PM   Specimen: Nasopharyngeal Swab  Result Value Ref Range Status   SARS Coronavirus 2 NEGATIVE NEGATIVE Final    Comment: (NOTE) SARS-CoV-2 target nucleic acids are NOT DETECTED. The SARS-CoV-2 RNA is generally detectable in upper and lower respiratory specimens during the acute phase of infection. Negative results do not preclude SARS-CoV-2 infection, do not rule out co-infections with other pathogens, and should not be used as the sole basis for treatment or other patient management decisions. Negative results must be combined with clinical observations, patient history, and epidemiological information. The expected result is Negative. Fact Sheet for Patients: SugarRoll.be Fact Sheet for Healthcare Providers: https://www.woods-mathews.com/ This test is not yet approved or cleared by the [place] FDA and  has been authorized for detection and/or diagnosis of SARS-CoV-2 by FDA under an Emergency Use Authorization (EUA). This EUA will remain  in effect (meaning this test can be  used) for the duration of the COVID-19 declaration under Section 56 4(b)(1) of the Act, 21 U.S.C. section 360bbb-3(b)(1), unless the authorization is terminated or revoked sooner. Performed at [facility], [place] [address] [place], [place] [address]          Radiology Studies: Ct Angio Chest Pe W Or Wo Contrast  Result Date: 10/30/2018 CLINICAL DATA:  73 year old female with shortness of breath. Evaluate for pulmonary embolism. EXAM: CT ANGIOGRAPHY CHEST WITH CONTRAST TECHNIQUE: Multidetector CT imaging of the chest was performed using the standard protocol during bolus administration of intravenous contrast. Multiplanar CT image reconstructions and MIPs were obtained to evaluate the vascular anatomy. CONTRAST:  61m OMNIPAQUE IOHEXOL 350 MG/ML SOLN COMPARISON:  Chest CT dated 03/19/2018 FINDINGS: Evaluation is limited due to streak artifact caused by patient's arms as well as due to cachexia. Cardiovascular: Moderate cardiomegaly. No pericardial effusion. Multi vessel coronary vascular calcification. There is retrograde flow of contrast from the right atrium into the IVC in keeping with right heart dysfunction. Correlation with echocardiogram recommended. There is moderate atherosclerotic calcification of the thoracic aorta. Evaluation of the aorta is limited due to suboptimal opacification. There is dilatation of the main pulmonary trunk suggestive of a degree of pulmonary hypertension. There is no CT evidence of pulmonary embolism. Mediastinum/Nodes: No hilar or mediastinal adenopathy. Multiple thyroid nodules noted. No mediastinal fluid collection. Lungs/Pleura: Severe centrilobular emphysema. Small left pleural effusion with subsegmental atelectasis of the left lower lobe. Right lung base linear atelectasis/scarring. There is no pneumothorax. The central airways are patent. Upper Abdomen: No acute abnormality. Musculoskeletal: No chest wall abnormality. No acute or significant osseous  findings. There is loss of subcutaneous fat and cachexia. Review of the MIP images confirms the above findings. IMPRESSION: 1. No CT evidence of pulmonary embolism. 2. Moderate cardiomegaly with evidence of right heart dysfunction. Correlation with echocardiogram recommended. 3. Small left pleural effusion with subsegmental atelectasis of the left lower lobe. 4. Severe emphysema with findings of pulmonary hypertension. 5. Aortic Atherosclerosis (ICD10-I70.0) and Emphysema (ICD10-J43.9). Electronically Signed   By: AAnner CreteM.D.   On: 10/30/2018 00:08   Dg Chest Port 1 View  Result Date: 10/30/2018 CLINICAL DATA:  Shortness of breath. EXAM: PORTABLE CHEST 1 VIEW COMPARISON:  10/28/2018 FINDINGS: Heart size upper limits of normal. Chronic aortic atherosclerosis. The lungs are clear. The vascularity is normal. No effusions. No acute bone finding. IMPRESSION: No active disease. Chronic aortic atherosclerosis. Heart size upper limits of normal. Electronically Signed   By: MNelson ChimesM.D.   On: 10/30/2018 17:01        Scheduled Meds: . aspirin EC  81 mg Oral Daily  . atorvastatin  10 mg Oral QHS  . enoxaparin (LOVENOX) injection  30 mg Subcutaneous Daily  . fluticasone furoate-vilanterol  1 puff Inhalation Daily   Or  . umeclidinium bromide  1 puff Inhalation Daily  . furosemide  40 mg Intravenous BID  . insulin aspart  0-9 Units Subcutaneous TID WC  . ipratropium-albuterol  3 mL Nebulization TID  . isosorbide-hydrALAZINE  2 tablet Oral TID  . losartan  50 mg Oral Daily  . mirtazapine  15 mg Oral QHS  . pantoprazole  40 mg Oral Daily  . potassium chloride  40 mEq Oral Once  . predniSONE  40 mg Oral Q breakfast  . sodium chloride flush  3 mL Intravenous Q12H   Continuous Infusions: . sodium chloride    . azithromycin 500 mg (10/30/18 1402)  LOS: 3 days    Time spent: 33 minutes    Harold Hedge, DO [facility] Pager [phone]  If 7PM-7AM, please contact  night-coverage [email or web address] Password [facility] 10/31/2018, 9:39 AM

## 2018-10-31 NOTE — Progress Notes (Addendum)
Called by nursing staff tachycardia and tachypenic.    EKG now. MAT Start amio drip now.   Not A fib . No role for eliquis.     Discussed with Dr Aundra Dubin.   Lindyn Vossler NP-C  10:15 AM

## 2018-11-01 DIAGNOSIS — J9611 Chronic respiratory failure with hypoxia: Secondary | ICD-10-CM

## 2018-11-01 DIAGNOSIS — J9612 Chronic respiratory failure with hypercapnia: Secondary | ICD-10-CM

## 2018-11-01 DIAGNOSIS — I5023 Acute on chronic systolic (congestive) heart failure: Secondary | ICD-10-CM | POA: Diagnosis not present

## 2018-11-01 LAB — BASIC METABOLIC PANEL
Anion gap: 15 (ref 5–15)
BUN: 31 mg/dL — ABNORMAL HIGH (ref 8–23)
CO2: 32 mmol/L (ref 22–32)
Calcium: 11.2 mg/dL — ABNORMAL HIGH (ref 8.9–10.3)
Chloride: 94 mmol/L — ABNORMAL LOW (ref 98–111)
Creatinine, Ser: 1.18 mg/dL — ABNORMAL HIGH (ref 0.44–1.00)
GFR calc Af Amer: 53 mL/min — ABNORMAL LOW (ref >60)
GFR calc non Af Amer: 46 mL/min — ABNORMAL LOW (ref >60)
Glucose, Bld: 150 mg/dL — ABNORMAL HIGH (ref 70–99)
Potassium: 3.7 mmol/L (ref 3.5–5.1)
Sodium: 141 mmol/L (ref 135–145)

## 2018-11-01 LAB — CBC
HCT: 43.5 % (ref 36.0–46.0)
Hemoglobin: 13.6 g/dL (ref 12.0–15.0)
MCH: 26 pg (ref 26.0–34.0)
MCHC: 31.3 g/dL (ref 30.0–36.0)
MCV: 83 fL (ref 80.0–100.0)
Platelets: 309 10*3/uL (ref 150–400)
RBC: 5.24 MIL/uL — ABNORMAL HIGH (ref 3.87–5.11)
RDW: 15.3 % (ref 11.5–15.5)
WBC: 10.3 10*3/uL (ref 4.0–10.5)
nRBC: 0 % (ref 0.0–0.2)

## 2018-11-01 LAB — GLUCOSE, CAPILLARY
Glucose-Capillary: 116 mg/dL — ABNORMAL HIGH (ref 70–99)
Glucose-Capillary: 177 mg/dL — ABNORMAL HIGH (ref 70–99)
Glucose-Capillary: 282 mg/dL — ABNORMAL HIGH (ref 70–99)
Glucose-Capillary: 285 mg/dL — ABNORMAL HIGH (ref 70–99)

## 2018-11-01 LAB — MAGNESIUM: Magnesium: 2 mg/dL (ref 1.7–2.4)

## 2018-11-01 MED ORDER — METOPROLOL SUCCINATE ER 25 MG PO TB24
25.0000 mg | ORAL_TABLET | Freq: Every day | ORAL | Status: DC
  Administered 2018-11-01 – 2018-11-02 (×2): 25 mg via ORAL
  Filled 2018-11-01 (×2): qty 1

## 2018-11-01 MED ORDER — ENSURE ENLIVE PO LIQD: 237.0000 mL | Freq: Three times a day (TID) | ORAL | Status: DC

## 2018-11-01 MED ORDER — SPIRONOLACTONE 25 MG PO TABS
25.0000 mg | ORAL_TABLET | Freq: Every day | ORAL | Status: DC
  Administered 2018-11-01 – 2018-11-02 (×2): 25 mg via ORAL
  Filled 2018-11-01 (×2): qty 1

## 2018-11-01 MED ORDER — POTASSIUM CHLORIDE CRYS ER 20 MEQ PO TBCR
20.0000 meq | EXTENDED_RELEASE_TABLET | Freq: Once | ORAL | Status: AC
  Administered 2018-11-01: 08:00:00 20 meq via ORAL
  Filled 2018-11-01: qty 1

## 2018-11-01 MED ORDER — FUROSEMIDE 40 MG PO TABS
40.0000 mg | ORAL_TABLET | Freq: Every day | ORAL | Status: DC
  Administered 2018-11-01 – 2018-11-02 (×2): 40 mg via ORAL
  Filled 2018-11-01 (×2): qty 1

## 2018-11-01 NOTE — Progress Notes (Signed)
Pt able to walk to bathroom with minimal assist using front wheel walker, desats when ambulating to 77% on 4L 02 BNC, 02 increased to 6L pt recovered quickly.

## 2018-11-01 NOTE — Progress Notes (Signed)
PROGRESS NOTE    Denise Derry A. Marvel Plan   AJO:[id number] DOB: 30-Jun-1945 DOA: 10/28/2018  PCP: [name], NP   [facility] Summary  Denise Derry A. Richardsonis a 73 y.o.femalewith medical history significant ofHTN, HLD,former smoker,diet-controled DM, COPD,chronic respiratory failureon 4L nasal cannula oxygen at home, cor pulmonale, GERD, depression, primary hyperparathyroidism,sCHF with EF 45%, colon cancer, pre on 10/28/2018 with shortness of breath for last few days which have been progressively worsening.  Worse with exertion.  Some dry cough but no chest pain, fever or chills.  Upon arrival to ED, she was hypoxic and was found to be using accessory respiratory muscles and was placed on nonrebreather.  Although her chest x-ray did not show any pulmonary edema however she was diagnosed with acute COPD exacerbation and acute on chronic systolic congestive heart failure since she had elevated BNP.  She was started on Solu-Medrol and IV Lasix and was admitted under hospitalist service. Overnight 10/27-10/28, patient had respiratory distress, placed on Bipap and started on empiric Heparin for concern of PE. Had negative CTA chest for PE. Echo showed small circumfrential pericardial effusion and findings concerning for amyloid, cardiology was consulted.   A & P   Principal Problem:   Chronic respiratory failure with hypoxia and hypercapnia ([name]) Active Problems:   Hyperlipidemia   Hyperparathyroidism, primary ([place])   Pulmonary hypertension ([name]) c/w cor pulmonale   COPD exacerbation ([name])   Acute on chronic systolic CHF (congestive heart failure) ([name])   Elevated troponin   Lactic acidosis   GERD (gastroesophageal reflux disease)   Acute on chronic respiratory failure with hypercapnia ([name])   Diabetes mellitus without complication-diet controled  Multifocal atrial tachycardia related to lung disease, now wandering atrial pacemaker rate currently controlled.    Amiodarone drip  stopped, metoprolol XL 25 mg daily started  Treat underlying disorder  No need for anticoagulation  Acute on chronic hypoxemic respiratory failure, Likely COPD exacerbation with Diastolic HF exacerbation On 4 L [place], baseline 4 L. On prednisone and nebulizers as well as diuresing per cardiology.  . Change IV Lasix to p.o. . Continue PO prednisone 40 mg for total 5 days steroids  . Continue doxycycline to avoid prolonged QTc . Continue nebulizers/inhalers: Incruse Ellipta, Breo Ellipta . Restart spironolactone 25 mg . Continue losartan 50 mg . Continue BiDil 2 tabs 3 times daily . Toprol-XL 25 mg daily started . Will need CHF clinic follow-up  Elevated troponin Right/left heart cath 03/19/2018 with near normal filling pressures, moderate PAH, low cardiac output and mild luminal irregularities on coronary angiogram. No chest pain. EKG with episodes of tachycardia and frequent PVCs. Likely demand ischemia.  Cardiomyopathy Echo concerning for cardiac amyloidosis, per cardio, patient has had extensive negative workup for cardiac amyloid. Concern for a variant of hypertrophic cardiomyopathy as noted on prior cardiac mri.  Hyperlipidemia continue lipitor 10 mg daily  Lactic acidosis Likely secondary to albuterol neb, unlikely sepsis. venous lactic acid 3.3.  No sign of infection, chest x-ray without sign of active disease    AKI on CKD 3 Cr 1.28->1.08->1.18 after holding losartan and diuresing, baseline 0.7-0.9.  [name] Changed to p.o. Lasix and losartan restarted  Hypomagnesemia follow-up today  Diastolic heart failure compensated. Wt 48->47->44.9 kg, -2.4 L output total with creatinine bump today.  Lasix to p.o. as above  Hypertension stable . Continue losartan, Lasix, BiDil , restarted spironolactone per cardiology  GERD Continue PPI  Type 2 Diabetes with hyperglycemia in setting of steroids. . Continue SSI  DVT prophylaxis: Lovenox  Code Status: Full Code  Husband at bedside Diet:  2g sodium restriction Disposition Plan: Likely medically stable for discharge tomorrow, follow-up with PT/OT  Consultants  . Cardiology  Procedures  . none  Antibiotics  Day 4/5 of antibiotics, azithromycin changed to doxycycline for COPD      Subjective   Patient seen and examined at bedside no acute distress and resting comfortably.  No events overnight.  Tolerating diet.  Denies any chest pain, shortness of breath, fever, nausea, vomiting, urinary complaints.  Admits to having bowel movement.  Otherwise ROS negative    Objective   Vitals:   11/01/18 0752 11/01/18 0910 11/01/18 1116 11/01/18 1344  BP: 129/75  (!) 99/56   Pulse: 66  69   Resp: _0 Temp: 97.9 F (36.6 C)     TempSrc: Oral     SpO2: 100%  93% 92%  Weight:      Height:        Intake/Output Summary (Last 24 hours) at 11/01/2018 1505 Last data filed at 10/31/2018 1959 Gross per 24 hour  Intake -  Output 400 ml  Net -400 ml   Filed Weights   10/30/18 0500 10/31/18 0604 11/01/18 0542  Weight: 47.2 kg 47.6 kg 44.9 kg    Examination:  Physical Exam Vitals signs and nursing note reviewed.  Constitutional:      General: She is not in acute distress.    Appearance: Normal appearance.     Comments: On nasal cannula  HENT:     Head: Normocephalic and atraumatic.     Nose: Nose normal.     Mouth/Throat:     Mouth: Mucous membranes are moist.  Eyes:     Extraocular Movements: Extraocular movements intact.  Neck:     Musculoskeletal: Normal range of motion. No neck rigidity.  Cardiovascular:     Rate and Rhythm: Normal rate and regular rhythm.  Pulmonary:     Effort: Pulmonary effort is normal.     Breath sounds: Normal breath sounds.  Abdominal:     General: Abdomen is flat. Bowel sounds are normal.     Palpations: Abdomen is soft.  Musculoskeletal: Normal range of motion.        General: No swelling.  Neurological:     General: No focal deficit present.     Mental Status: She  is alert. Mental status is at baseline.  Psychiatric:        Mood and Affect: Mood normal.        Behavior: Behavior normal.     Data Reviewed: I have personally reviewed following labs and imaging studies  CBC: Recent Labs  Lab 10/28/18 2044 10/28/18 2057 10/29/18 0137 10/29/18 1558 10/30/18 1520 11/01/18 0417  WBC 6.9  --   --   --  9.4 10.3  NEUTROABS 6.0  --   --   --   --   --   HGB 13.0 13.9 12.6 12.6 13.2 13.6  HCT 42.1 41.0 37.0 37.0 42.2 43.5  MCV 86.1  --   --   --  84.1 83.0  PLT 208  --   --   --  263 053   Basic Metabolic Panel: Recent Labs  Lab 10/28/18 2044  10/29/18 0257 10/29/18 1558 10/29/18 2151 10/30/18 0027 10/30/18 1013 10/31/18 0440 11/01/18 0417  NA 140   < > 139 140  --  139  --  141 141  K 3.6   < > 3.7 3.7  --  4.0  --  3.6 3.7  CL 102  --  101  --   --  100  --  98 94*  CO2 27  --  27  --   --  27  --  33* 32  GLUCOSE 175*  --  300*  --   --  285*  --  135* 150*  BUN 13  --  17  --   --  24*  --  23 31*  CREATININE 0.98  --  1.20*  --   --  1.28*  --  1.08* 1.18*  CALCIUM 10.6*  --  10.3  --   --  10.6*  --  10.9* 11.2*  MG  --   --  1.5*  --  1.5*  --  2.7* 2.1 2.0   < > = values in this interval not displayed.   GFR: Estimated Creatinine Clearance: 30.1 mL/min (A) (by C-G formula based on SCr of 1.18 mg/dL (H)). Liver Function Tests: Recent Labs  Lab 10/28/18 2044  AST 32  ALT 24  ALKPHOS 83  BILITOT 0.7  PROT 6.0*  ALBUMIN 3.6   No results for input(s): LIPASE, AMYLASE in the last 168 hours. No results for input(s): AMMONIA in the last 168 hours. Coagulation Profile: Recent Labs  Lab 10/28/18 2317  INR 1.1   Cardiac Enzymes: No results for input(s): CKTOTAL, CKMB, CKMBINDEX, TROPONINI in the last 168 hours. BNP (last 3 results) Recent Labs    03/01/18 1218 05/02/18 1249 06/24/18 1111  PROBNP 1,336.0* 910.0* 840.0*   HbA1C: No results for input(s): HGBA1C in the last 72 hours. CBG: Recent Labs  Lab  10/31/18 1157 10/31/18 1644 10/31/18 2210 11/01/18 0750 11/01/18 1114  GLUCAP 235* 224* 322* 116* 177*   Lipid Profile: No results for input(s): CHOL, HDL, LDLCALC, TRIG, CHOLHDL, LDLDIRECT in the last 72 hours. Thyroid Function Tests: No results for input(s): TSH, T4TOTAL, FREET4, T3FREE, THYROIDAB in the last 72 hours. Anemia Panel: No results for input(s): VITAMINB12, FOLATE, FERRITIN, TIBC, IRON, RETICCTPCT in the last 72 hours. Sepsis Labs: Recent Labs  Lab 10/28/18 2227 10/29/18 1318 10/29/18 1635 10/30/18 1520  LATICACIDVEN 2.3* 3.3* 3.2* 3.3*    Recent Results (from the past 240 hour(s))  SARS CORONAVIRUS 2 (TAT 6-24 HRS) Nasopharyngeal Nasopharyngeal Swab     Status: None   Collection Time: 10/28/18  7:56 PM   Specimen: Nasopharyngeal Swab  Result Value Ref Range Status   SARS Coronavirus 2 NEGATIVE NEGATIVE Final    Comment: (NOTE) SARS-CoV-2 target nucleic acids are NOT DETECTED. The SARS-CoV-2 RNA is generally detectable in upper and lower respiratory specimens during the acute phase of infection. Negative results do not preclude SARS-CoV-2 infection, do not rule out co-infections with other pathogens, and should not be used as the sole basis for treatment or other patient management decisions. Negative results must be combined with clinical observations, patient history, and epidemiological information. The expected result is Negative. Fact Sheet for Patients: SugarRoll.be Fact Sheet for Healthcare Providers: https://www.woods-mathews.com/ This test is not yet approved or cleared by the [place] FDA and  has been authorized for detection and/or diagnosis of SARS-CoV-2 by FDA under an Emergency Use Authorization (EUA). This EUA will remain  in effect (meaning this test can be used) for the duration of the COVID-19 declaration under Section 56 4(b)(1) of the Act, 21 U.S.C. section 360bbb-3(b)(1), unless the  authorization is terminated or revoked sooner. Performed at [facility], [place]  [address] [place], [place] [address]          Radiology Studies: Dg Chest Port 1 View  Result Date: 10/30/2018 CLINICAL DATA:  Shortness of breath. EXAM: PORTABLE CHEST 1 VIEW COMPARISON:  10/28/2018 FINDINGS: Heart size upper limits of normal. Chronic aortic atherosclerosis. The lungs are clear. The vascularity is normal. No effusions. No acute bone finding. IMPRESSION: No active disease. Chronic aortic atherosclerosis. Heart size upper limits of normal. Electronically Signed   By: Nelson Chimes M.D.   On: 10/30/2018 17:01        Scheduled Meds: . aspirin EC  81 mg Oral Daily  . atorvastatin  10 mg Oral QHS  . doxycycline  100 mg Oral Q12H  . feeding supplement (ENSURE ENLIVE)  237 mL Oral TID BM  . fluticasone furoate-vilanterol  1 puff Inhalation Daily   Or  . umeclidinium bromide  1 puff Inhalation Daily  . furosemide  40 mg Oral Daily  . insulin aspart  0-9 Units Subcutaneous TID WC  . ipratropium-albuterol  3 mL Nebulization TID  . isosorbide-hydrALAZINE  2 tablet Oral TID  . losartan  50 mg Oral Daily  . metoprolol succinate  25 mg Oral Daily  . mirtazapine  15 mg Oral QHS  . pantoprazole  40 mg Oral Daily  . predniSONE  40 mg Oral Q breakfast  . sodium chloride flush  3 mL Intravenous Q12H  . spironolactone  25 mg Oral Daily   Continuous Infusions: . sodium chloride       LOS: 4 days    Time spent: 33 minutes    Harold Hedge, DO [facility] Pager [phone]  If 7PM-7AM, please contact night-coverage [email or web address] Password [email or web address] 11/01/2018, 3:05 PM

## 2018-11-01 NOTE — Care Management Important Message (Signed)
Important Message  Patient Details  Name: Denise [name]. [place] MRN: [id number] Date of Birth: 07-28-1945   Medicare Important Message Given:  Yes     Shelda Altes 11/01/2018, 1:03 PM

## 2018-11-01 NOTE — Progress Notes (Signed)
Inpatient Diabetes Program Recommendations  AACE/ADA: New Consensus Statement on Inpatient Glycemic Control (2015)  Target Ranges:  Prepandial:   less than 140 mg/dL      Peak postprandial:   less than 180 mg/dL (1-2 hours)      Critically ill patients:  140 - 180 mg/dL   Lab Results  Component Value Date   GLUCAP 116 (H) 11/01/2018   HGBA1C 6.7 (H) 06/24/2018    Review of Glycemic Control  Diabetes history: DM 2 Outpatient Diabetes medications: Diet controlled Current orders for Inpatient glycemic control:  Novolog 0-9 units tid  Patient currently on PO Prednisone 40 mg Daily  Inpatient Diabetes Program Recommendations:    Glucose trends increase after meal intake due to po prednisone dose.   Consider Novolog 3 units tid meal coverage if patient consumes at least 50% of meals.  Thanks,  Tama Headings RN, MSN, BC-ADM Inpatient Diabetes Coordinator Team Pager [phone] (8a-5p)

## 2018-11-01 NOTE — Progress Notes (Signed)
Initial Nutrition Assessment  DOCUMENTATION CODES:   Underweight, Severe malnutrition in context of chronic illness  INTERVENTION:   - Ensure Enlive po TID, each supplement provides 350 kcal and 20 grams of protein  - Encourage adequate PO intake  NUTRITION DIAGNOSIS:   Severe Malnutrition related to chronic illness (COPD, CHF) as evidenced by severe fat depletion, severe muscle depletion.  GOAL:   Patient will meet greater than or equal to 90% of their needs  MONITOR:   PO intake, Supplement acceptance, Labs, Weight trends, I & O's  REASON FOR ASSESSMENT:   Malnutrition Screening Tool    ASSESSMENT:   73 year old female who presented to the ED on 10/26 with SOB. PMH of COPD, CHF, DM, HTN, HLD, GERD, colon cancer.   Spoke with pt at bedside. Pt in recliner requesting assistance to the bathroom. Care team aware.  Pt reports that she does not like the flavor of the food here "because there's no salt." Pt states that when she is at home, she has a great appetite and eats 3 meals daily. Pt reports she drinks 2 Boost daily at home and has been drinking the Ensure Enlive supplements during admission. Pt amenable to drinking 3 supplements daily. RD to adjust order.  Pt reports that her UBW is 130-135 lbs and that she last weighed this 3 years ago when she moved from [place] to [place]. Pt states that she now weighs 99 lbs. When asked what pt believes has contributed to her weight loss, she states "being here in the hospital since Monday."  Reviewed weight history in chart. Pt with a 13.2 kg weight loss since 03/12/18. This is a 22.7% weight loss which is severe and significant for timeframe.  RD discussed the importance of adequate PO intake including adequate kcal and protein in preventing additional dry weight loss and maintaining lean muscle mass.  Meal Completion: 50-75% x 3 meals  Medications reviewed and include: Ensure Enlive BID, Lasix, SSI, Remeron, Protonix,  Prednisone, spironolactone  Labs reviewed: BUN 31, creatinine 1.18 CBG's: 116-322 x 24 hours  UOP: 1200 ml x 24 hours I/O's: -2.3 L since admit  NUTRITION - FOCUSED PHYSICAL EXAM:    Most Recent Value  Orbital Region  Severe depletion  Upper Arm Region  Severe depletion  Thoracic and Lumbar Region  Severe depletion  Buccal Region  Severe depletion  Temple Region  Severe depletion  Clavicle Bone Region  Severe depletion  Clavicle and Acromion Bone Region  Severe depletion  Scapular Bone Region  Severe depletion  Dorsal Hand  Severe depletion  Patellar Region  Severe depletion  Anterior Thigh Region  Severe depletion  Posterior Calf Region  Severe depletion  Edema (RD Assessment)  None  Hair  Reviewed  Eyes  Reviewed  Mouth  Reviewed  Skin  Reviewed  Nails  Reviewed       Diet Order:   Diet Order            Diet 2 gram sodium Room service appropriate? Yes; Fluid consistency: Thin  Diet effective now              EDUCATION NEEDS:   Education needs have been addressed  Skin:  Skin Assessment: Reviewed RN Assessment  Last BM:  no documented BM  Height:   Ht Readings from Last 1 Encounters:  10/28/18 _0  (1.676 m)    Weight:   Wt Readings from Last 1 Encounters:  11/01/18 44.9 kg    Ideal Body Weight:  59.1  kg  BMI:  Body mass index is 15.98 kg/m.  Estimated Nutritional Needs:   Kcal:  1400-1600  Protein:  60-75 grams  Fluid:  1.5 L/day    Gaynell Face, MS, RD, LDN Inpatient Clinical Dietitian Pager: [phone] Weekend/After Hours: [phone]

## 2018-11-01 NOTE — Evaluation (Signed)
Physical Therapy Evaluation Patient Details Name: Denise [name] MRN: [id number] DOB: 27-Mar-1945 Today's Date: 11/01/2018   History of Present Illness  Pt adm with acute on chronic respiratory failure due to copd exacerbation and acute on chronic systolic heart failure. Required bipap. PMH - copd on home O2 at 4L, chf, htn, dm, colon CA, pulmonary htn,   Clinical Impression  Pt presents to PT with decr mobility and higher O2 requirement. Expect she will return to baseline with mobility as medical status improves. Will follow acutely but likely won't need PT after DC.     Follow Up Recommendations No PT follow up;Supervision - Intermittent    Equipment Recommendations  None recommended by PT    Recommendations for Other Services       Precautions / Restrictions Precautions Precautions: Fall      Mobility  Bed Mobility               General bed mobility comments: Pt up in chair  Transfers Overall transfer level: Needs assistance Equipment used: None Transfers: Sit to/from Stand Sit to Stand: Min assist;Min guard         General transfer comment: assist for safety and to bring hips up from low commode  Ambulation/Gait Ambulation/Gait assistance: Min guard Gait Distance (Feet): 125 Feet Assistive device: Rolling walker (2 wheeled);None Gait Pattern/deviations: Step-through pattern;Decreased stride length;Trunk flexed;Drifts right/left Gait velocity: decr Gait velocity interpretation: 1.31 - 2.62 ft/sec, indicative of limited community ambulator General Gait Details: Assist for balance. Pt with incr stability with use of rolling walker. Amb on 6L of O2 with SpO2> 90%  Stairs            Wheelchair Mobility    Modified Rankin (Stroke Patients Only)       Balance Overall balance assessment: Needs assistance Sitting-balance support: No upper extremity supported;Feet supported Sitting balance-Leahy Scale: Good     Standing balance support: No  upper extremity supported;During functional activity Standing balance-Leahy Scale: Fair                               Pertinent Vitals/Pain Pain Assessment: No/denies pain    Home Living Family/patient expects to be discharged to:: Private residence Living Arrangements: Spouse/significant other Available Help at Discharge: Family;Available 24 hours/day Type of Home: House Home Access: Stairs to enter Entrance Stairs-Rails: Right Entrance Stairs-Number of Steps: 4 Home Layout: One level;Laundry or work area in [place]: Environmental consultant - 2 wheels Additional Comments: home O2    Prior Function Level of Independence: Independent         Comments: Doesn't use walker     Hand Dominance   Dominant Hand: Right    Extremity/Trunk Assessment   Upper Extremity Assessment Upper Extremity Assessment: Overall WFL for tasks assessed    Lower Extremity Assessment Lower Extremity Assessment: Generalized weakness       Communication   Communication: No difficulties  Cognition Arousal/Alertness: Awake/alert Behavior During Therapy: Impulsive Overall Cognitive Status: No family/caregiver present to determine baseline cognitive functioning Area of Impairment: Safety/judgement                         Safety/Judgement: Decreased awareness of safety     General Comments: Pt getting up quickly to go to bathroom. multiple lines tangled and pt continuing to move toward bathroom      General Comments      Exercises  Assessment/Plan    PT Assessment Patient needs continued PT services  PT Problem List Decreased strength;Decreased activity tolerance;Decreased balance;Decreased mobility;Decreased safety awareness       PT Treatment Interventions DME instruction;Gait training;Functional mobility training;Therapeutic activities;Therapeutic exercise;Balance training;Patient/family education    PT Goals (Current goals can be found in the Care Plan  section)  Acute Rehab PT Goals Patient Stated Goal: go home PT Goal Formulation: With patient Time For Goal Achievement: 11/15/18 Potential to Achieve Goals: Good    Frequency Min 3X/week   Barriers to discharge        Co-evaluation               AM-PAC PT "6 Clicks" Mobility  Outcome Measure Help needed turning from your back to your side while in a flat bed without using bedrails?: None Help needed moving from lying on your back to sitting on the side of a flat bed without using bedrails?: A Little Help needed moving to and from a bed to a chair (including a wheelchair)?: A Little Help needed standing up from a chair using your arms (e.g., wheelchair or bedside chair)?: A Little Help needed to walk in hospital room?: A Little Help needed climbing 3-5 steps with a railing? : A Little 6 Click Score: 19    End of Session Equipment Utilized During Treatment: Oxygen Activity Tolerance: Patient tolerated treatment well Patient left: in chair;with call bell/phone within reach;with chair alarm set Nurse Communication: Mobility status PT Visit Diagnosis: Unsteadiness on feet (R26.81);Muscle weakness (generalized) (M62.81)    Time: 7517-0017 PT Time Calculation (min) (ACUTE ONLY): 17 min   Charges:   PT Evaluation $PT Eval Moderate Complexity: [place] Pager [phone] Office [place] 11/01/2018, 4:38 PM

## 2018-11-01 NOTE — Progress Notes (Signed)
Patient ID: Denise [name], female   DOB: 1945/04/11, 73 y.o.   MRN: [id number]     Advanced Heart Failure Rounding Note  PCP-Cardiologist: Dr. Aundra Dubin  Subjective:    Admitted for acute on chronic hypoxic respiratory failure, in the setting of AECOPD and a/c diastolic CHF.   Good diuresis yesterday, weight down. Breathing much better.  Now on 4L [place].  HR now in 70s, wandering atrial pacemaker.   Objective:   Weight Range: 44.9 kg Body mass index is 15.98 kg/m.   Vital Signs:   Temp:  [97.8 F (36.6 C)-98.2 F (36.8 C)] 97.9 F (36.6 C) (10/30 0752) Pulse Rate:  [66-77] 66 (10/30 0752) Resp:  [18-41] 18 (10/30 0752) BP: (112-155)/(67-91) 129/75 (10/30 0752) SpO2:  [92 %-100 %] 100 % (10/30 0752) FiO2 (%):  [44 %] 44 % (10/29 1353) Weight:  [44.9 kg] 44.9 kg (10/30 0542)    Weight change: Filed Weights   10/30/18 0500 10/31/18 0604 11/01/18 0542  Weight: 47.2 kg 47.6 kg 44.9 kg    Intake/Output:   Intake/Output Summary (Last 24 hours) at 11/01/2018 0836 Last data filed at 10/31/2018 1959 Gross per 24 hour  Intake 20 ml  Output 1050 ml  Net -1030 ml      Physical Exam    General: NAD, thin/frail Neck: JVP 7-8 cm, no thyromegaly or thyroid nodule.  Lungs: Distant BS CV: Nondisplaced PMI.  Heart regular S1/S2, no S3/S4, no murmur.  No peripheral edema.   Abdomen: Soft, nontender, no hepatosplenomegaly, no distention.  Skin: Intact without lesions or rashes.  Neurologic: Alert and oriented x 3.  Psych: Normal affect. Extremities: No clubbing or cyanosis.  HEENT: Normal.    Telemetry   Wandering atrial pacemaker rate 70s (personally reviewed)  Labs    CBC Recent Labs    10/30/18 1520 11/01/18 0417  WBC 9.4 10.3  HGB 13.2 13.6  HCT 42.2 43.5  MCV 84.1 83.0  PLT 263 408   Basic Metabolic Panel Recent Labs    10/30/18 1013 10/31/18 0440 11/01/18 0417  NA  --  141 141  K  --  3.6 3.7  CL  --  98 94*  CO2  --  33* 32  GLUCOSE  --   135* 150*  BUN  --  23 31*  CREATININE  --  1.08* 1.18*  CALCIUM  --  10.9* 11.2*  MG 2.7* 2.1  --    Liver Function Tests No results for input(s): AST, ALT, ALKPHOS, BILITOT, PROT, ALBUMIN in the last 72 hours. No results for input(s): LIPASE, AMYLASE in the last 72 hours. Cardiac Enzymes No results for input(s): CKTOTAL, CKMB, CKMBINDEX, TROPONINI in the last 72 hours.  BNP: BNP (last 3 results) Recent Labs    03/15/18 2008 10/28/18 2044  BNP 1,914.0* 1,586.2*    ProBNP (last 3 results) Recent Labs    03/01/18 1218 05/02/18 1249 06/24/18 1111  PROBNP 1,336.0* 910.0* 840.0*     D-Dimer Recent Labs    10/29/18 1522  DDIMER 1.89*   Hemoglobin A1C No results for input(s): HGBA1C in the last 72 hours. Fasting Lipid Panel No results for input(s): CHOL, HDL, LDLCALC, TRIG, CHOLHDL, LDLDIRECT in the last 72 hours. Thyroid Function Tests No results for input(s): TSH, T4TOTAL, T3FREE, THYROIDAB in the last 72 hours.  Invalid input(s): FREET3  Other results:   Imaging    No results found.   Medications:     Scheduled Medications: . aspirin EC  81 mg Oral  Daily  . atorvastatin  10 mg Oral QHS  . doxycycline  100 mg Oral Q12H  . feeding supplement (ENSURE ENLIVE)  237 mL Oral BID BM  . fluticasone furoate-vilanterol  1 puff Inhalation Daily   Or  . umeclidinium bromide  1 puff Inhalation Daily  . furosemide  40 mg Oral Daily  . insulin aspart  0-9 Units Subcutaneous TID WC  . ipratropium-albuterol  3 mL Nebulization TID  . isosorbide-hydrALAZINE  2 tablet Oral TID  . losartan  50 mg Oral Daily  . mirtazapine  15 mg Oral QHS  . pantoprazole  40 mg Oral Daily  . predniSONE  40 mg Oral Q breakfast  . sodium chloride flush  3 mL Intravenous Q12H    Infusions: . sodium chloride    . amiodarone Stopped (11/01/18 0425)    PRN Medications: sodium chloride, acetaminophen, albuterol, dextromethorphan-guaiFENesin, hydrALAZINE, LORazepam, polyethylene  glycol, sodium chloride flush    Patient Profile   Denise A. Richardsonis a 73 y.o.femalewho has severe COPD on home oxygen, pulmonary HTN, suspected hypertrophic cardiomyopathy, HLD, DM2, Colon cancer s/p resection, hyperparathyroidism pending surgery, and HTN admitted for acute on chronic hypoxic respiratory failure, in the setting of acute COPDE and a/c dCHF.   Assessment/Plan   1. Acute on chronic hypoxemic respiratory failure: Now back on 4 L [place].  Suspect AECOPD but also with volume overload suggesting acute on chronic diastolic CHF.  She diuresed yesterday, feels better.  - Continue steroids, nebs, and doxycycline per primary team for treatment of COPD (on home oxygen at baseline).  - Lasix transitioned to po.  2.Acute on chronic systolic => diastolic CHF: With prominent RV failure. Echo 3/20 with EF 45%, asymmetric septal hypertrophy, moderate RV systolic dysfunction. RHC/LHC was done in 3/20, showing no significant CAD and moderate pulmonary arterial hypertension. Cardiac MRI showed LV EF 38%, moderate basal septal hypertrophy, RV EF 30%, mid-wall LGE in the basal anteroseptal wall and lesser extent in mid lateral wall. Most concerning for hypertrophic CMP, less likely cardiac amyloidosis.  PYP scan was negative for evidence of transthyretin amyloidosis and myeloma panel negative. She does not have a known family history of HCM.  Most recent echo done this admission showed EF back up to 55-60%.  Breathing better today, think she is near-euvolemic.  - Continue Lasix 40 mg po daily for home.  - Restart spironolactone 25 mg daily.   - Continue losartan 50 mg daily.  - Continue Bidil 2 tabs tid.  3. Hypertrophic cardiomyopathy: Imaging has been concerning for HCM without LVOT obstruction or SAM.  No family history of HCM or sudden death.  4. Pulmonary hypertension: RHC showed moderate pulmonary arterial hypertension with high PVR (8.6 WU). V/Q scan (9/19) did not show chronic PE.  CT chest high resolution showed no ILD, there was moderate emphysema. Pulmonary hypertension seems to be out of proportion to degree of lung disease. I suspected mixed group 1 (idiopathic PAH) and group 3 PH. Serologies sent and came back negative other thanRF elevated (CCP negative). HIV negative. She has been on amlodipine in the past without improvement.  I tried her on Tyvaso (to limit V/Q mismatching in the setting of lung parenchymal disease), but she felt worse on this med and stopped it.  I suspect that her pulmonary hypertension is primarily group 3 from COPD. Would hold off on further pulmonary vasodilators.  5. HTN: BP controlled on current regimen.  6. Multifocal atrial tachycardia: Suspect this is related to her baseline lung  disease.  HR better today with improved oxygenation. Now appears to have wandering atrial pacemaker, rate 70s.   - Would rather not send her home on amiodarone with her significant lung disease.  Stop amiodarone gtt today, start Toprol XL 25 mg daily.  - She does not need anticoagulation for MAT.   She should be ready for home today or tomorrow.  Will need CHF clinic followup.  Cardiac meds for home: Lasix 40 mg daily, Toprol XL 25 mg daily, spironolactone 25 daily, Bidil 2 tabs tid, losartan 50 mg daily, ASA 81 daily, atorvastatin 10 mg daily, KCl 20 daily.   Loralie Champagne 11/01/2018 8:36 AM

## 2018-11-02 LAB — CBC
HCT: 39.1 % (ref 36.0–46.0)
Hemoglobin: 12.6 g/dL (ref 12.0–15.0)
MCH: 26.6 pg (ref 26.0–34.0)
MCHC: 32.2 g/dL (ref 30.0–36.0)
MCV: 82.5 fL (ref 80.0–100.0)
Platelets: 276 10*3/uL (ref 150–400)
RBC: 4.74 MIL/uL (ref 3.87–5.11)
RDW: 15.1 % (ref 11.5–15.5)
WBC: 8 10*3/uL (ref 4.0–10.5)
nRBC: 0 % (ref 0.0–0.2)

## 2018-11-02 LAB — BASIC METABOLIC PANEL
Anion gap: 13 (ref 5–15)
BUN: 35 mg/dL — ABNORMAL HIGH (ref 8–23)
CO2: 34 mmol/L — ABNORMAL HIGH (ref 22–32)
Calcium: 11.2 mg/dL — ABNORMAL HIGH (ref 8.9–10.3)
Chloride: 95 mmol/L — ABNORMAL LOW (ref 98–111)
Creatinine, Ser: 1.12 mg/dL — ABNORMAL HIGH (ref 0.44–1.00)
GFR calc Af Amer: 56 mL/min — ABNORMAL LOW (ref >60)
GFR calc non Af Amer: 49 mL/min — ABNORMAL LOW (ref >60)
Glucose, Bld: 113 mg/dL — ABNORMAL HIGH (ref 70–99)
Potassium: 3.6 mmol/L (ref 3.5–5.1)
Sodium: 142 mmol/L (ref 135–145)

## 2018-11-02 LAB — MAGNESIUM: Magnesium: 1.9 mg/dL (ref 1.7–2.4)

## 2018-11-02 MED ORDER — IPRATROPIUM-ALBUTEROL 0.5-2.5 (3) MG/3ML IN SOLN
3.0000 mL | Freq: Two times a day (BID) | RESPIRATORY_TRACT | Status: DC
  Administered 2018-11-02: 08:00:00 3 mL via RESPIRATORY_TRACT
  Filled 2018-11-02: qty 3

## 2018-11-02 MED ORDER — METOPROLOL SUCCINATE ER 25 MG PO TB24: 25.0000 mg | ORAL_TABLET | Freq: Every day | ORAL | 1 refills | Status: DC

## 2018-11-02 MED ORDER — DOXYCYCLINE HYCLATE 100 MG PO TABS: 100.0000 mg | ORAL_TABLET | Freq: Two times a day (BID) | ORAL | 0 refills | Status: AC

## 2018-11-02 NOTE — Discharge Summary (Addendum)
Physician Discharge Summary  Denise [name] Plan INO:[id number] DOB: Dec 08, 1945 DOA: 10/28/2018  PCP: [name], NP  Admit date: 10/28/2018 Discharge date: 11/02/2018  Admitted From: Home Discharged to: [place]: None Equipment/Devices: None Discharge Condition: Stable  Recommendations for Outpatient Follow-up   1. Follow up with PCP in 1-2 weeks 2. Please follow up BMP/CBC 3. Follow-up in heart failure clinic  Medication Adjustments at Discharge  1. Added Toprol XL 25 mg daily 2. Doxycycline 100 mg twice daily x1 day completion of 5 days antibiotic treatment in setting of COPD exacerbation   Hospital Summary  Denise A. Richardsonis a 73 y.o.femalewith medical history significant ofHTN, HLD,former smoker,diet-controled DM, COPD,chronic respiratory failureon 4L nasal cannula oxygen at home, cor pulmonale, GERD, depression, primary hyperparathyroidism,sCHF with EF 45%, colon cancer,pre on 10/26/2020with shortness of breath for last few days which have been progressively worsening. Worse with exertion. Some dry cough but no chest pain, fever or chills. Upon arrival to ED, she was hypoxic and was found to be using accessory respiratory muscles and was placed on nonrebreather. Although her chest x-ray did not show any pulmonary edema however she was diagnosed with acute COPD exacerbation and acute on chronic systolic congestive heart failure since she had elevated BNP. She was started on Solu-Medrol and IV Lasix and was admitted under hospitalist service.   Overnight 10/27-10/28, patient had respiratory distress, placed on Bipap and started on empiric Heparin for concern of PE. Had negative CTA chest for PE. Echo showed small circumfrential pericardial effusion and findings concerning for amyloid, cardiology was consulted.   Cardiology noted that the patient has had extensive work-up for suspected cardiac MRI which was unremarkable thought to be due to  hypertrophic cardiac apathy on previous cardiac MRI.  Cardiology diagnosed patient with heart failure exacerbation patient was diuresed.  Patient had an episode of multifocal atrial tachycardia 10/29 and was started on amiodarone drip.  Her rate was well controlled and she was taken off of amiodarone and put on Toprol-XL 25 mg daily.  She had been restarted on home dose p.o. Lasix on 10/30 restarted on home dose spironolactone.  Regarding her COPD exacerbation, patient completed 5 days of steroids.  Completed 2 days azithromycin which was changed to doxycycline and discharged on doxycycline 100 mg twice daily x1 day for a total of 5 days antibiotic treatment in setting of COPD exacerbation.  Agree with severe malnutrition as noted by the dietitian on 10/30.  She was evaluated by PT/OT and was not recommended for SNF or home health.  Patient was discharged home on in improved and stable condition with recommendations to follow-up with PCP with CBC and BMP in 1 week as well as heart failure clinic.     A & P   Principal Problem:   Chronic respiratory failure with hypoxia and hypercapnia ([name]) Active Problems:   Hyperlipidemia   Hyperparathyroidism, primary ([place])   Pulmonary hypertension ([name]) c/w cor pulmonale   COPD exacerbation ([name])   Acute on chronic systolic CHF (congestive heart failure) ([name])   Elevated troponin   Lactic acidosis   GERD (gastroesophageal reflux disease)   Acute on chronic respiratory failure with hypercapnia ([name])   Diabetes mellitus without complication-diet controled Multifocal atrial tachycardia related to lung disease, now wandering atrial pacemaker  rate currently controlled.    Discharged home metoprolol XL 25 mg daily started  Acute on chronic hypoxemic respiratory failure, Likely COPD exacerbation with Diastolic HF exacerbation  On 4 L [place], baseline 4 L.  Completed 5 days of prednisone and 4 days of Lasix  Continue home Lasix 74mlligrams p.o.  daily  Continue doxycycline for 1 day   Continue home nebulizers/inhalers  Home spironolactone 25 mg  Continue home losartan 50 mg  Continue home BiDil 2 tabs 3 times daily  Toprol-XL 25 mg daily started  Will need CHF clinic follow-up  Elevated troponin  Right/left heart cath 03/19/2018 with near normal filling pressures, moderate PAH, low cardiac output and mild luminal irregularities on coronary angiogram. No chest pain. EKG with episodes of tachycardia and frequent PVCs. Likely demand ischemia.  Cardiomyopathy  Echo concerning for cardiac amyloidosis, per cardio, patient has had extensive negative workup for cardiac amyloid. Concern for a variant of hypertrophic cardiomyopathy as noted on prior cardiac mri.  Hyperlipidemia  continue lipitor 10 mg daily  Lactic acidosis  Likely secondary to albuterol neb   AKI on CKD 3  Improved, currently 1.12 creatinine after holding losartan and diuresing, baseline 0.7-0.9.   Follow-up renal function outpatient  Hypomagnesemia  repleted Diastolic heart failure  as above Hypertension stable.  Plan as above GERD Continue outpatient Pepcid Type 2 Diabetes  follow-up outpatient    Code Status: Full Code Diet recommendation: Sodium restriction, heart healthy  Consultants  . Cardiology  Procedures  . BiPAP  Antibiotics  Patient completed 4/5days of azithromycin-> doxycycline for COPD exacerbation Discharged with form days of doxycycline for total of 5 days antibiotics   Subjective  Patient seen and examined at bedside no acute distress and resting comfortably.  No events overnight.  Tolerating diet. In good spirits and anticipating discharge.   Denies any chest pain, shortness of breath, fever, nausea, vomiting, urinary or bowel complaints. Otherwise ROS negative   Objective   Discharge Exam: Vitals:   11/02/18 0743 11/02/18 0745  BP:    Pulse:    Resp:    Temp:    SpO2: 98% 99%   Vitals:   11/02/18 0300  11/02/18 0536 11/02/18 0743 11/02/18 0745  BP:  (!) 165/96    Pulse:  81    Resp: (!) 22 19    Temp:  97.9 F (36.6 C)    TempSrc:  Oral    SpO2:  98% 98% 99%  Weight:  44.6 kg    Height:        Physical Exam Vitals signs and nursing note reviewed.  Constitutional:      Appearance: Normal appearance.     Comments: Frail elderly female in no acute distress. On 3 L [place]  HENT:     Head: Normocephalic and atraumatic.     Nose: Nose normal.     Mouth/Throat:     Mouth: Mucous membranes are moist.  Eyes:     Extraocular Movements: Extraocular movements intact.  Neck:     Musculoskeletal: Normal range of motion. No neck rigidity.  Cardiovascular:     Rate and Rhythm: Normal rate and regular rhythm.     Heart sounds: No murmur.  Pulmonary:     Breath sounds: Normal breath sounds.  Abdominal:     General: Abdomen is flat.     Palpations: Abdomen is soft.  Musculoskeletal: Normal range of motion.        General: No swelling.     Right lower leg: No edema.     Left lower leg: No edema.  Neurological:     General: No focal deficit present.     Mental Status: She is alert. Mental status is at baseline.  Psychiatric:        Mood and Affect: Mood normal.        Behavior: Behavior normal.       The results of significant diagnostics from this hospitalization (including imaging, microbiology, ancillary and laboratory) are listed below for reference.     Microbiology: Recent Results (from the past 240 hour(s))  SARS CORONAVIRUS 2 (TAT 6-24 HRS) Nasopharyngeal Nasopharyngeal Swab     Status: None   Collection Time: 10/28/18  7:56 PM   Specimen: Nasopharyngeal Swab  Result Value Ref Range Status   SARS Coronavirus 2 NEGATIVE NEGATIVE Final    Comment: (NOTE) SARS-CoV-2 target nucleic acids are NOT DETECTED. The SARS-CoV-2 RNA is generally detectable in upper and lower respiratory specimens during the acute phase of infection. Negative results do not preclude SARS-CoV-2  infection, do not rule out co-infections with other pathogens, and should not be used as the sole basis for treatment or other patient management decisions. Negative results must be combined with clinical observations, patient history, and epidemiological information. The expected result is Negative. Fact Sheet for Patients: SugarRoll.be Fact Sheet for Healthcare Providers: https://www.woods-mathews.com/ This test is not yet approved or cleared by the [place] FDA and  has been authorized for detection and/or diagnosis of SARS-CoV-2 by FDA under an Emergency Use Authorization (EUA). This EUA will remain  in effect (meaning this test can be used) for the duration of the COVID-19 declaration under Section 56 4(b)(1) of the Act, 21 U.S.C. section 360bbb-3(b)(1), unless the authorization is terminated or revoked sooner. Performed at [facility], [place] [address], [place], [place] [address]      Labs: BNP (last 3 results) Recent Labs    03/15/18 2008 10/28/18 2044  BNP 1,914.0* 6,045.4*   Basic Metabolic Panel: Recent Labs  Lab 10/29/18 0257 10/29/18 1558 10/29/18 2151 10/30/18 0027 10/30/18 1013 10/31/18 0440 11/01/18 0417 11/02/18 0443  NA 139 140  --  139  --  141 141 142  K 3.7 3.7  --  4.0  --  3.6 3.7 3.6  CL 101  --   --  100  --  98 94* 95*  CO2 27  --   --  27  --  33* 32 34*  GLUCOSE 300*  --   --  285*  --  135* 150* 113*  BUN 17  --   --  24*  --  23 31* 35*  CREATININE 1.20*  --   --  1.28*  --  1.08* 1.18* 1.12*  CALCIUM 10.3  --   --  10.6*  --  10.9* 11.2* 11.2*  MG 1.5*  --  1.5*  --  2.7* 2.1 2.0 1.9   Liver Function Tests: Recent Labs  Lab 10/28/18 2044  AST 32  ALT 24  ALKPHOS 83  BILITOT 0.7  PROT 6.0*  ALBUMIN 3.6   No results for input(s): LIPASE, AMYLASE in the last 168 hours. No results for input(s): AMMONIA in the last 168 hours. CBC: Recent Labs  Lab 10/28/18 2044   10/29/18 0137 10/29/18 1558 10/30/18 1520 11/01/18 0417 11/02/18 0443  WBC 6.9  --   --   --  9.4 10.3 8.0  NEUTROABS 6.0  --   --   --   --   --   --   HGB 13.0   < > 12.6 12.6 13.2 13.6 12.6  HCT 42.1   < > 37.0 37.0 42.2 43.5 39.1  MCV 86.1  --   --   --  84.1 83.0 82.5  PLT 208  --   --   --  263 309 276   < > = values in this interval not displayed.   Cardiac Enzymes: No results for input(s): CKTOTAL, CKMB, CKMBINDEX, TROPONINI in the last 168 hours. BNP: Invalid input(s): POCBNP CBG: Recent Labs  Lab 10/31/18 2210 11/01/18 0750 11/01/18 1114 11/01/18 1639 11/01/18 2039  GLUCAP 322* 116* 177* 282* 285*   D-Dimer No results for input(s): DDIMER in the last 72 hours. Hgb A1c No results for input(s): HGBA1C in the last 72 hours. Lipid Profile No results for input(s): CHOL, HDL, LDLCALC, TRIG, CHOLHDL, LDLDIRECT in the last 72 hours. Thyroid function studies No results for input(s): TSH, T4TOTAL, T3FREE, THYROIDAB in the last 72 hours.  Invalid input(s): FREET3 Anemia work up No results for input(s): VITAMINB12, FOLATE, FERRITIN, TIBC, IRON, RETICCTPCT in the last 72 hours. Urinalysis    Component Value Date/Time   COLORURINE YELLOW 09/16/2017 2002   APPEARANCEUR CLEAR 09/16/2017 2002   LABSPEC 1.010 09/16/2017 2002   PHURINE 6.0 09/16/2017 2002   GLUCOSEU 50 (A) 09/16/2017 2002   HGBUR SMALL (A) 09/16/2017 2002   BILIRUBINUR Negative 09/28/2017 1604   KETONESUR 5 (A) 09/16/2017 2002   PROTEINUR Negative 09/28/2017 [place] NEGATIVE 09/16/2017 2002   UROBILINOGEN 0.2 09/28/2017 1604   NITRITE Negative 09/28/2017 1604   NITRITE NEGATIVE 09/16/2017 2002   LEUKOCYTESUR Negative 09/28/2017 1604   Sepsis Labs Invalid input(s): PROCALCITONIN,  WBC,  LACTICIDVEN Microbiology Recent Results (from the past 240 hour(s))  SARS CORONAVIRUS 2 (TAT 6-24 HRS) Nasopharyngeal Nasopharyngeal Swab     Status: None   Collection Time: 10/28/18  7:56 PM   Specimen:  Nasopharyngeal Swab  Result Value Ref Range Status   SARS Coronavirus 2 NEGATIVE NEGATIVE Final    Comment: (NOTE) SARS-CoV-2 target nucleic acids are NOT DETECTED. The SARS-CoV-2 RNA is generally detectable in upper and lower respiratory specimens during the acute phase of infection. Negative results do not preclude SARS-CoV-2 infection, do not rule out co-infections with other pathogens, and should not be used as the sole basis for treatment or other patient management decisions. Negative results must be combined with clinical observations, patient history, and epidemiological information. The expected result is Negative. Fact Sheet for Patients: SugarRoll.be Fact Sheet for Healthcare Providers: https://www.woods-mathews.com/ This test is not yet approved or cleared by the [place] FDA and  has been authorized for detection and/or diagnosis of SARS-CoV-2 by FDA under an Emergency Use Authorization (EUA). This EUA will remain  in effect (meaning this test can be used) for the duration of the COVID-19 declaration under Section 56 4(b)(1) of the Act, 21 U.S.C. section 360bbb-3(b)(1), unless the authorization is terminated or revoked sooner. Performed at [facility], [place] [address] [place], [place] [address]     Discharge Instructions     Discharge Instructions    AMB referral to CHF clinic   Complete by: As directed    Diet - low sodium heart healthy   Complete by: As directed    Discharge instructions   Complete by: As directed    You were seen in the hospital for shortness of breath and found to have COPD exacerbation as well as heart failure exacerbation.  You were seen by the cardiologist.  Had improvement in her symptoms and discharged in stable condition  Discharge: -Continue taking medications as prescribed -Stop taking omeprazole -Start taking Toprol-XL 25 mg daily -Take doxycycline 100 mg this evening  to  complete antibiotic treatment. If you have uncontrollable diarrhea, notify your PCP -Follow-up with your PCP in 1 week with blood work prior to your appointment -Follow-up in the heart failure clinic  If you have any significant change or worsening of your symptoms please advised to contact your PCP or return to the ED   Heart Failure patients record your daily weight using the same scale at the same time of day   Complete by: As directed    Increase activity slowly   Complete by: As directed      Allergies as of 11/02/2018   No Known Allergies     Medication List    STOP taking these medications   omeprazole 20 MG capsule Commonly known as: PRILOSEC   predniSONE 10 MG tablet Commonly known as: DELTASONE     TAKE these medications   Accu-Chek Aviva Plus w/Device Kit Use as instructed to test blood sugar up to 3 times daily What changed:   how much to take  how to take this  when to take this  additional instructions   Accu-Chek Aviva Soln USE AS INSTRUCTED TO TEST BLOOD SUGAR DAILY What changed:   how much to take  how to take this  when to take this  additional instructions   accu-chek soft touch lancets Use as instructed to test blood sugar up to 3 times daily What changed:   how much to take  how to take this  when to take this  additional instructions   albuterol 108 (90 Base) MCG/ACT inhaler Commonly known as: VENTOLIN HFA INHALE 1-2 PUFFS INTO THE LUNGS EVERY 4 HOURS AS NEEDED FOR WHEEZING OR SHORTNESS OF BREATH. What changed:   how much to take  how to take this  when to take this  reasons to take this   aspirin 81 MG EC tablet Take 1 tablet (81 mg total) by mouth daily.   atorvastatin 10 MG tablet Commonly known as: LIPITOR TAKE 1 TABLET BY MOUTH EVERYDAY AT BEDTIME What changed: See the new instructions.   BiDil 20-37.5 MG tablet Generic drug: isosorbide-hydrALAZINE Take 2 tablets by mouth 3 (three) times daily.    doxycycline 100 MG tablet Commonly known as: VIBRA-TABS Take 1 tablet (100 mg total) by mouth every 12 (twelve) hours for 1 day.   famotidine 40 MG tablet Commonly known as: PEPCID TAKE 1 TABLET BY MOUTH TWICE A DAY   furosemide 40 MG tablet Commonly known as: Lasix Take 1 tablet (40 mg total) by mouth daily.   glucose blood test strip Commonly known as: Accu-Chek Aviva Plus Use as instructed to test blood sugar up to 3 times daily   Klor-Con M10 10 MEQ tablet Generic drug: potassium chloride TAKE 2 TABLETS (20 MEQ TOTAL) BY MOUTH DAILY. FOR LOW POTASSIUM. What changed: See the new instructions.   losartan 100 MG tablet Commonly known as: COZAAR Take 50 mg by mouth daily.   metoprolol succinate 25 MG 24 hr tablet Commonly known as: TOPROL-XL Take 1 tablet (25 mg total) by mouth daily.   MIRALAX PO Take 17 g by mouth as needed.   mirtazapine 15 MG tablet Commonly known as: REMERON TAKE 1 TABLET BY MOUTH AT BEDTIME. FOR DEPRESSION AND APPETITE. What changed:   how much to take  how to take this  when to take this  additional instructions   OXYGEN Inhale 4 L into the lungs continuous.   spironolactone 25 MG tablet Commonly known as: ALDACTONE Take 1  tablet (25 mg total) by mouth daily. Take at night What changed:   when to take this  additional instructions   Trelegy Ellipta 100-62.5-25 MCG/INH Aepb Generic drug: Fluticasone-Umeclidin-Vilant Inhale 1 puff into the lungs daily.      Follow-up Information    [facility] Follow up on 11/11/2018.   Specialty: Cardiology Why: Dr. Claris Gladden office at 2:30 PM. Parking garrage code [phone] Contact information: [address] [phone] [place] [phone]         No Known Allergies  Time coordinating discharge: Over 30 minutes   SIGNED:   Harold Hedge, D.O. [facility] 11/02/2018, 8:27 AM

## 2018-11-02 NOTE — Progress Notes (Signed)
Patient resting comfortably on 3L nasal cannula with no distress noted.  Bipap ordered for PRN and currently not indicated at this time.  Will continue to monitor.

## 2018-11-04 ENCOUNTER — Inpatient Hospital Stay (HOSPITAL_COMMUNITY): Payer: Medicare HMO

## 2018-11-04 ENCOUNTER — Inpatient Hospital Stay (HOSPITAL_COMMUNITY)
Admission: EM | Admit: 2018-11-04 | Discharge: 2018-11-08 | DRG: 040 | Disposition: A | Discharge status: Rehabilitation Facility | Payer: Medicare HMO | Attending: Neurology | Admitting: Neurology

## 2018-11-04 ENCOUNTER — Other Ambulatory Visit: Payer: Self-pay | Admitting: *Deleted

## 2018-11-04 ENCOUNTER — Emergency Department (HOSPITAL_COMMUNITY): Payer: Medicare HMO

## 2018-11-04 DIAGNOSIS — I6381 Other cerebral infarction due to occlusion or stenosis of small artery: Secondary | ICD-10-CM | POA: Diagnosis not present

## 2018-11-04 DIAGNOSIS — Z681 Body mass index (BMI) 19 or less, adult: Secondary | ICD-10-CM

## 2018-11-04 DIAGNOSIS — I63531 Cerebral infarction due to unspecified occlusion or stenosis of right posterior cerebral artery: Secondary | ICD-10-CM | POA: Diagnosis not present

## 2018-11-04 DIAGNOSIS — R4781 Slurred speech: Secondary | ICD-10-CM | POA: Diagnosis present

## 2018-11-04 DIAGNOSIS — I361 Nonrheumatic tricuspid (valve) insufficiency: Secondary | ICD-10-CM | POA: Diagnosis not present

## 2018-11-04 DIAGNOSIS — R29810 Facial weakness: Secondary | ICD-10-CM | POA: Diagnosis present

## 2018-11-04 DIAGNOSIS — R29898 Other symptoms and signs involving the musculoskeletal system: Secondary | ICD-10-CM | POA: Diagnosis not present

## 2018-11-04 DIAGNOSIS — R531 Weakness: Secondary | ICD-10-CM | POA: Diagnosis not present

## 2018-11-04 DIAGNOSIS — I160 Hypertensive urgency: Secondary | ICD-10-CM | POA: Diagnosis present

## 2018-11-04 DIAGNOSIS — I422 Other hypertrophic cardiomyopathy: Secondary | ICD-10-CM | POA: Diagnosis not present

## 2018-11-04 DIAGNOSIS — E1122 Type 2 diabetes mellitus with diabetic chronic kidney disease: Secondary | ICD-10-CM | POA: Diagnosis present

## 2018-11-04 DIAGNOSIS — B3749 Other urogenital candidiasis: Secondary | ICD-10-CM | POA: Diagnosis not present

## 2018-11-04 DIAGNOSIS — I10 Essential (primary) hypertension: Secondary | ICD-10-CM | POA: Diagnosis present

## 2018-11-04 DIAGNOSIS — I5022 Chronic systolic (congestive) heart failure: Secondary | ICD-10-CM | POA: Diagnosis not present

## 2018-11-04 DIAGNOSIS — R471 Dysarthria and anarthria: Secondary | ICD-10-CM | POA: Diagnosis present

## 2018-11-04 DIAGNOSIS — R300 Dysuria: Secondary | ICD-10-CM | POA: Diagnosis present

## 2018-11-04 DIAGNOSIS — J449 Chronic obstructive pulmonary disease, unspecified: Secondary | ICD-10-CM | POA: Diagnosis present

## 2018-11-04 DIAGNOSIS — I69154 Hemiplegia and hemiparesis following nontraumatic intracerebral hemorrhage affecting left non-dominant side: Secondary | ICD-10-CM | POA: Diagnosis not present

## 2018-11-04 DIAGNOSIS — E43 Unspecified severe protein-calorie malnutrition: Secondary | ICD-10-CM | POA: Diagnosis present

## 2018-11-04 DIAGNOSIS — I614 Nontraumatic intracerebral hemorrhage in cerebellum: Secondary | ICD-10-CM | POA: Diagnosis not present

## 2018-11-04 DIAGNOSIS — I6340 Cerebral infarction due to embolism of unspecified cerebral artery: Secondary | ICD-10-CM | POA: Diagnosis not present

## 2018-11-04 DIAGNOSIS — Z20828 Contact with and (suspected) exposure to other viral communicable diseases: Secondary | ICD-10-CM | POA: Diagnosis present

## 2018-11-04 DIAGNOSIS — Z8673 Personal history of transient ischemic attack (TIA), and cerebral infarction without residual deficits: Secondary | ICD-10-CM | POA: Diagnosis present

## 2018-11-04 DIAGNOSIS — I130 Hypertensive heart and chronic kidney disease with heart failure and stage 1 through stage 4 chronic kidney disease, or unspecified chronic kidney disease: Secondary | ICD-10-CM | POA: Diagnosis present

## 2018-11-04 DIAGNOSIS — T45615A Adverse effect of thrombolytic drugs, initial encounter: Secondary | ICD-10-CM | POA: Diagnosis not present

## 2018-11-04 DIAGNOSIS — F32A Depression, unspecified: Secondary | ICD-10-CM | POA: Diagnosis present

## 2018-11-04 DIAGNOSIS — R29705 NIHSS score 5: Secondary | ICD-10-CM | POA: Diagnosis present

## 2018-11-04 DIAGNOSIS — F329 Major depressive disorder, single episode, unspecified: Secondary | ICD-10-CM | POA: Diagnosis present

## 2018-11-04 DIAGNOSIS — R29818 Other symptoms and signs involving the nervous system: Secondary | ICD-10-CM | POA: Diagnosis not present

## 2018-11-04 DIAGNOSIS — Z85038 Personal history of other malignant neoplasm of large intestine: Secondary | ICD-10-CM | POA: Diagnosis not present

## 2018-11-04 DIAGNOSIS — J9611 Chronic respiratory failure with hypoxia: Secondary | ICD-10-CM | POA: Diagnosis not present

## 2018-11-04 DIAGNOSIS — F419 Anxiety disorder, unspecified: Secondary | ICD-10-CM | POA: Diagnosis present

## 2018-11-04 DIAGNOSIS — E876 Hypokalemia: Secondary | ICD-10-CM | POA: Diagnosis present

## 2018-11-04 DIAGNOSIS — Z7982 Long term (current) use of aspirin: Secondary | ICD-10-CM

## 2018-11-04 DIAGNOSIS — R32 Unspecified urinary incontinence: Secondary | ICD-10-CM | POA: Diagnosis present

## 2018-11-04 DIAGNOSIS — R7309 Other abnormal glucose: Secondary | ICD-10-CM | POA: Diagnosis not present

## 2018-11-04 DIAGNOSIS — I63311 Cerebral infarction due to thrombosis of right middle cerebral artery: Secondary | ICD-10-CM | POA: Diagnosis not present

## 2018-11-04 DIAGNOSIS — I5023 Acute on chronic systolic (congestive) heart failure: Secondary | ICD-10-CM | POA: Diagnosis present

## 2018-11-04 DIAGNOSIS — I639 Cerebral infarction, unspecified: Secondary | ICD-10-CM

## 2018-11-04 DIAGNOSIS — B373 Candidiasis of vulva and vagina: Secondary | ICD-10-CM | POA: Diagnosis not present

## 2018-11-04 DIAGNOSIS — E119 Type 2 diabetes mellitus without complications: Secondary | ICD-10-CM

## 2018-11-04 DIAGNOSIS — J9612 Chronic respiratory failure with hypercapnia: Secondary | ICD-10-CM | POA: Diagnosis present

## 2018-11-04 DIAGNOSIS — Y92230 Patient room in hospital as the place of occurrence of the external cause: Secondary | ICD-10-CM | POA: Diagnosis not present

## 2018-11-04 DIAGNOSIS — I63442 Cerebral infarction due to embolism of left cerebellar artery: Secondary | ICD-10-CM | POA: Diagnosis present

## 2018-11-04 DIAGNOSIS — D62 Acute posthemorrhagic anemia: Secondary | ICD-10-CM | POA: Diagnosis not present

## 2018-11-04 DIAGNOSIS — I619 Nontraumatic intracerebral hemorrhage, unspecified: Secondary | ICD-10-CM | POA: Diagnosis not present

## 2018-11-04 DIAGNOSIS — K5790 Diverticulosis of intestine, part unspecified, without perforation or abscess without bleeding: Secondary | ICD-10-CM | POA: Diagnosis not present

## 2018-11-04 DIAGNOSIS — E1165 Type 2 diabetes mellitus with hyperglycemia: Secondary | ICD-10-CM | POA: Diagnosis not present

## 2018-11-04 DIAGNOSIS — Z87891 Personal history of nicotine dependence: Secondary | ICD-10-CM

## 2018-11-04 DIAGNOSIS — G936 Cerebral edema: Secondary | ICD-10-CM | POA: Diagnosis present

## 2018-11-04 DIAGNOSIS — I5043 Acute on chronic combined systolic (congestive) and diastolic (congestive) heart failure: Secondary | ICD-10-CM | POA: Diagnosis present

## 2018-11-04 DIAGNOSIS — N183 Chronic kidney disease, stage 3 unspecified: Secondary | ICD-10-CM | POA: Diagnosis present

## 2018-11-04 DIAGNOSIS — G8324 Monoplegia of upper limb affecting left nondominant side: Secondary | ICD-10-CM | POA: Diagnosis present

## 2018-11-04 DIAGNOSIS — Z79899 Other long term (current) drug therapy: Secondary | ICD-10-CM

## 2018-11-04 DIAGNOSIS — E785 Hyperlipidemia, unspecified: Secondary | ICD-10-CM | POA: Diagnosis present

## 2018-11-04 DIAGNOSIS — Z9981 Dependence on supplemental oxygen: Secondary | ICD-10-CM | POA: Diagnosis not present

## 2018-11-04 DIAGNOSIS — Z7409 Other reduced mobility: Secondary | ICD-10-CM | POA: Diagnosis not present

## 2018-11-04 DIAGNOSIS — N189 Chronic kidney disease, unspecified: Secondary | ICD-10-CM | POA: Diagnosis not present

## 2018-11-04 DIAGNOSIS — Z789 Other specified health status: Secondary | ICD-10-CM | POA: Diagnosis not present

## 2018-11-04 DIAGNOSIS — R0902 Hypoxemia: Secondary | ICD-10-CM | POA: Diagnosis not present

## 2018-11-04 DIAGNOSIS — R278 Other lack of coordination: Secondary | ICD-10-CM | POA: Diagnosis not present

## 2018-11-04 DIAGNOSIS — I629 Nontraumatic intracranial hemorrhage, unspecified: Secondary | ICD-10-CM | POA: Diagnosis not present

## 2018-11-04 DIAGNOSIS — N179 Acute kidney failure, unspecified: Secondary | ICD-10-CM | POA: Diagnosis not present

## 2018-11-04 DIAGNOSIS — E210 Primary hyperparathyroidism: Secondary | ICD-10-CM | POA: Diagnosis present

## 2018-11-04 LAB — IMMUNOFIXATION ELECTROPHORESIS
IgA: 221 mg/dL (ref 64–422)
IgG (Immunoglobin G), Serum: 1351 mg/dL (ref 586–1602)
IgM (Immunoglobulin M), Srm: 47 mg/dL (ref 26–217)
Total Protein ELP: 7.3 g/dL (ref 6.0–8.5)

## 2018-11-04 LAB — DIFFERENTIAL
Abs Immature Granulocytes: 0.01 10*3/uL (ref 0.00–0.07)
Basophils Absolute: 0 10*3/uL (ref 0.0–0.1)
Basophils Relative: 0 %
Eosinophils Absolute: 0.1 10*3/uL (ref 0.0–0.5)
Eosinophils Relative: 2 %
Immature Granulocytes: 0 %
Lymphocytes Relative: 24 %
Lymphs Abs: 1.3 10*3/uL (ref 0.7–4.0)
Monocytes Absolute: 0.6 10*3/uL (ref 0.1–1.0)
Monocytes Relative: 12 %
Neutro Abs: 3.3 10*3/uL (ref 1.7–7.7)
Neutrophils Relative %: 62 %

## 2018-11-04 LAB — COMPREHENSIVE METABOLIC PANEL
ALT: 35 U/L (ref 0–44)
AST: 32 U/L (ref 15–41)
Albumin: 4.5 g/dL (ref 3.5–5.0)
Alkaline Phosphatase: 105 U/L (ref 38–126)
Anion gap: 14 (ref 5–15)
BUN: 31 mg/dL — ABNORMAL HIGH (ref 8–23)
CO2: 31 mmol/L (ref 22–32)
Calcium: 11.5 mg/dL — ABNORMAL HIGH (ref 8.9–10.3)
Chloride: 96 mmol/L — ABNORMAL LOW (ref 98–111)
Creatinine, Ser: 1.36 mg/dL — ABNORMAL HIGH (ref 0.44–1.00)
GFR calc Af Amer: 45 mL/min — ABNORMAL LOW (ref >60)
GFR calc non Af Amer: 39 mL/min — ABNORMAL LOW (ref >60)
Glucose, Bld: 183 mg/dL — ABNORMAL HIGH (ref 70–99)
Potassium: 3.6 mmol/L (ref 3.5–5.1)
Sodium: 141 mmol/L (ref 135–145)
Total Bilirubin: 0.8 mg/dL (ref 0.3–1.2)
Total Protein: 7.8 g/dL (ref 6.5–8.1)

## 2018-11-04 LAB — FIBRINOGEN: Fibrinogen: 160 mg/dL — ABNORMAL LOW (ref 210–475)

## 2018-11-04 LAB — CBC
HCT: 52.3 % — ABNORMAL HIGH (ref 36.0–46.0)
Hemoglobin: 15.5 g/dL — ABNORMAL HIGH (ref 12.0–15.0)
MCH: 26.1 pg (ref 26.0–34.0)
MCHC: 29.6 g/dL — ABNORMAL LOW (ref 30.0–36.0)
MCV: 87.9 fL (ref 80.0–100.0)
Platelets: 276 10*3/uL (ref 150–400)
RBC: 5.95 MIL/uL — ABNORMAL HIGH (ref 3.87–5.11)
RDW: 14.9 % (ref 11.5–15.5)
WBC: 5.3 10*3/uL (ref 4.0–10.5)
nRBC: 0 % (ref 0.0–0.2)

## 2018-11-04 LAB — PROTIME-INR
INR: 1.1 (ref 0.8–1.2)
Prothrombin Time: 13.6 seconds (ref 11.4–15.2)

## 2018-11-04 LAB — MRSA PCR SCREENING: MRSA by PCR: NEGATIVE

## 2018-11-04 LAB — TYPE AND SCREEN
ABO/RH(D): O POS
Antibody Screen: NEGATIVE

## 2018-11-04 LAB — APTT: aPTT: 26 seconds (ref 24–36)

## 2018-11-04 LAB — ABO/RH: ABO/RH(D): O POS

## 2018-11-04 LAB — SARS CORONAVIRUS 2 (TAT 6-24 HRS): SARS Coronavirus 2: NEGATIVE

## 2018-11-04 MED ORDER — LABETALOL HCL 5 MG/ML IV SOLN: 20.0000 mg | Freq: Once | INTRAVENOUS | Status: DC

## 2018-11-04 MED ORDER — SODIUM CHLORIDE 0.9 % IV SOLN
INTRAVENOUS | Status: DC
  Administered 2018-11-04 – 2018-11-08 (×3): via INTRAVENOUS

## 2018-11-04 MED ORDER — UMECLIDINIUM BROMIDE 62.5 MCG/INH IN AEPB
1.0000 | INHALATION_SPRAY | Freq: Every day | RESPIRATORY_TRACT | Status: DC
  Administered 2018-11-05 – 2018-11-08 (×4): 1 via RESPIRATORY_TRACT
  Filled 2018-11-04: qty 7

## 2018-11-04 MED ORDER — CLEVIDIPINE BUTYRATE 0.5 MG/ML IV EMUL
0.0000 mg/h | INTRAVENOUS | Status: DC
  Administered 2018-11-04: 1 mg/h via INTRAVENOUS
  Filled 2018-11-04 (×2): qty 50

## 2018-11-04 MED ORDER — PANTOPRAZOLE SODIUM 40 MG IV SOLR
40.0000 mg | Freq: Every day | INTRAVENOUS | Status: DC
  Administered 2018-11-04: 40 mg via INTRAVENOUS

## 2018-11-04 MED ORDER — TRANEXAMIC ACID-NACL 1000-0.7 MG/100ML-% IV SOLN
1000.0000 mg | INTRAVENOUS | Status: AC
  Administered 2018-11-04: 1000 mg via INTRAVENOUS
  Filled 2018-11-04: qty 100

## 2018-11-04 MED ORDER — ACETAMINOPHEN 160 MG/5ML PO SOLN: 650.0000 mg | ORAL | Status: DC | PRN

## 2018-11-04 MED ORDER — FLUTICASONE-UMECLIDIN-VILANT 100-62.5-25 MCG/INH IN AEPB: 1.0000 | INHALATION_SPRAY | Freq: Every day | RESPIRATORY_TRACT | Status: DC

## 2018-11-04 MED ORDER — FLUTICASONE FUROATE-VILANTEROL 100-25 MCG/INH IN AEPB
1.0000 | INHALATION_SPRAY | Freq: Every day | RESPIRATORY_TRACT | Status: DC
  Administered 2018-11-05 – 2018-11-08 (×4): 1 via RESPIRATORY_TRACT
  Filled 2018-11-04: qty 28

## 2018-11-04 MED ORDER — ALBUTEROL SULFATE HFA 108 (90 BASE) MCG/ACT IN AERS
1.0000 | INHALATION_SPRAY | RESPIRATORY_TRACT | Status: DC | PRN
  Filled 2018-11-04: qty 6.7

## 2018-11-04 MED ORDER — SODIUM CHLORIDE 0.9 % IV SOLN
50.0000 mL | Freq: Once | INTRAVENOUS | Status: AC
  Administered 2018-11-04: 50 mL via INTRAVENOUS

## 2018-11-04 MED ORDER — FENTANYL CITRATE (PF) 100 MCG/2ML IJ SOLN
12.5000 ug | Freq: Once | INTRAMUSCULAR | Status: AC
  Administered 2018-11-04: 12.5 ug via INTRAVENOUS
  Filled 2018-11-04: qty 2

## 2018-11-04 MED ORDER — FAMOTIDINE 20 MG PO TABS
20.0000 mg | ORAL_TABLET | Freq: Every day | ORAL | Status: DC
  Filled 2018-11-04: qty 1

## 2018-11-04 MED ORDER — SENNOSIDES-DOCUSATE SODIUM 8.6-50 MG PO TABS: 1.0000 | ORAL_TABLET | Freq: Every evening | ORAL | Status: DC | PRN

## 2018-11-04 MED ORDER — SODIUM CHLORIDE 0.9 % IV SOLN: Freq: Once | INTRAVENOUS | Status: DC

## 2018-11-04 MED ORDER — ORAL CARE MOUTH RINSE
15.0000 mL | Freq: Two times a day (BID) | OROMUCOSAL | Status: DC
  Administered 2018-11-04 – 2018-11-08 (×8): 15 mL via OROMUCOSAL

## 2018-11-04 MED ORDER — METOPROLOL SUCCINATE ER 25 MG PO TB24
25.0000 mg | ORAL_TABLET | Freq: Every day | ORAL | Status: DC
  Administered 2018-11-05 – 2018-11-06 (×2): 25 mg via ORAL
  Filled 2018-11-04 (×2): qty 1

## 2018-11-04 MED ORDER — ATORVASTATIN CALCIUM 10 MG PO TABS
10.0000 mg | ORAL_TABLET | Freq: Every day | ORAL | Status: DC
  Administered 2018-11-04 – 2018-11-05 (×2): 10 mg via ORAL
  Filled 2018-11-04: qty 1

## 2018-11-04 MED ORDER — SODIUM CHLORIDE 0.9% FLUSH
3.0000 mL | Freq: Once | INTRAVENOUS | Status: DC
Start: 2018-11-04 — End: 2018-11-08

## 2018-11-04 MED ORDER — ALTEPLASE (STROKE) FULL DOSE INFUSION
0.9000 mg/kg | Freq: Once | INTRAVENOUS | Status: AC
  Administered 2018-11-04: 40.4 mg via INTRAVENOUS
  Filled 2018-11-04: qty 100

## 2018-11-04 MED ORDER — ACETAMINOPHEN 325 MG PO TABS
650.0000 mg | ORAL_TABLET | ORAL | Status: DC | PRN
  Administered 2018-11-04 – 2018-11-07 (×2): 650 mg via ORAL
  Filled 2018-11-04 (×2): qty 2

## 2018-11-04 MED ORDER — ACETAMINOPHEN 650 MG RE SUPP: 650.0000 mg | RECTAL | Status: DC | PRN

## 2018-11-04 MED ORDER — STROKE: EARLY STAGES OF RECOVERY BOOK: Freq: Once | Status: DC

## 2018-11-04 NOTE — Progress Notes (Signed)
STAT CT head reveals acute hemorrhage in right cerebellar hemisphere, moderate in size. The appearance presents significant risk for hematoma expansion, peri-hematoma edema and possible mass effect upon the brainstem with high likelihood for subsequent herniation.   She is possibly out of the time window for tPA reversal as it was given 5 hours ago, but given severity of the hemorrhage will follow [facility] official order set for tPA reversal, to include transfusing with cryoprecipitate 0.15 units/kg in addition to tranexamic acid administration and STAT fibrinogen level.   Repeat CT head will be obtained in 6 hours.   Have obtained consent for IV meds from patient. Discussed risks/benefits.   Discussed with Pharmacy.  Electronically signed: Dr. Kerney Elbe

## 2018-11-04 NOTE — Consult Note (Signed)
   [facility] CM Inpatient Consult   11/04/2018  Denise [name] [birth year] [id number]    Patientreviewed forpotential need of [facility] care management services under her [facility] plan, with 19% unplanned risk for readmission and hospitalization.    Patient had been outreached by [facility] RN CM coordinators for [facility] COPD follow-up calls, care coordination and disease management of COPD in distant past.  Patient was admitted for Multifocal atrial tachycardia related to lung disease,Acute on chronic hypoxemic respiratory failure, Likely COPD exacerbation with Diastolic HF exacerbation.  Primary Care provider is Dr.Catherine Carlis Abbott with [facility], listed to provide transition of care follow-up. She is being followed at the Heart Failure clinic.  Per MD note, she was evaluated by PT and was not recommended for SNF (skilled nursing facility) or home health. Patient transitioned to home prior to speaking with her.   Plan:Will follow with General EMMI calls to follow-up.   For questions and referral, please contact:   Shaiden Aldous A. Aleysha Meckler, BSN, RN-BC [facility] Liaison Cell: [phone]

## 2018-11-04 NOTE — H&P (Signed)
Stroke history and physical  CC: Slurred speech, left-sided weakness  History is obtained from: Patient, chart, patient's husband and sister  HPI: Denise [name] is a 73 y.o. female past medical history of diabetes, hypertension, hyperlipidemia, colon cancer, COPD, chronic respiratory failure with hypoxia, systolic CHF with DOE, last known normal at 12:45 PM today at lunch when she had a sudden onset of left-sided facial weakness and left arm weakness. She was having lunch with family, suddenly was noted to have left lower facial weakness, slurred speech and inability to use her left hand to pull her mask over her glasses. EMS was called.  They confirmed the left-sided deficits.  The activated code stroke in the field.  Her systolic blood pressures were 130s at the time. She was recently in the hospital and treated for chronic respiratory failure with hypoxia and hypercapnia thought to be related to acute on chronic hypoxic respiratory failure, likely COPD exacerbation with diastolic heart failure exacerbation. No recent surgeries.  No bleeding tendencies.  No history of strokes in the past. Reports ongoing shortness of breath but no other new symptoms other than these left-sided symptoms of weakness.  LKW: 12:45 PM on 11/04/2018 tpa given?:  Yes Premorbid modified Rankin scale (mRS): Between 3 and 4  ROS: ROS was performed and is negative except as noted in the HPI.   Past Medical History:  Diagnosis Date  . Altered mental status   . Anxiety and depression   . Chronic respiratory failure with hypoxia ([place]) 10/02/2017   Assoc with ? Cor pulmonale dx 09/2017 so rx 02 1-2 lpm 24/7  - 10/17/2017  Walked RA x 3 laps @ 185 ft each stopped due to  End of study, nl pace,  desat to 87 on 3rd lap - 12/19/2017  Saturations on Room Air at Rest =91 % and while Ambulating = 87%  But  on  2 Liters of pulsed oxygen while Ambulating =93% so rec POC 2lpm walking / use at rest if sats under 90%      .  Colon cancer ([place]) 1988   Resected  . COPD (chronic obstructive pulmonary disease) ([place])   . Diabetes mellitus without complication ([name])    diet controlled- no meds. per pt  . Diarrhea 04/02/2018  . Diverticulosis   . Hyperlipidemia   . Hypertension   . Hypoxia 10/30/2018  . Insomnia   . Primary hyperparathyroidism ([place])   . SBO (small bowel obstruction) ([place])   . Tubular adenoma of rectum 02/23/2013   low grade  . Vitamin D deficiency     Family History  Problem Relation Age of Onset  . Ovarian cancer Mother   . Breast cancer Neg Hx   . Hyperparathyroidism Neg Hx   . Colon cancer Neg Hx   . Esophageal cancer Neg Hx   . Stomach cancer Neg Hx    Social History:   reports that she quit smoking about 31 years ago. Her smoking use included cigarettes. She has a 30.00 pack-year smoking history. She has never used smokeless tobacco. She reports that she does not drink alcohol or use drugs.  Medications  Current Facility-Administered Medications:  .   stroke: mapping our early stages of recovery book, , Does not apply, Once, Amie Portland, MD .  alteplase (ACTIVASE) 1 mg/mL infusion 40.4 mg, 0.9 mg/kg, Intravenous, Once **FOLLOWED BY** 0.9 %  sodium chloride infusion, 50 mL, Intravenous, Once, Amie Portland, MD .  0.9 %  sodium chloride infusion, , Intravenous, Continuous,  Amie Portland, MD .  acetaminophen (TYLENOL) tablet 650 mg, 650 mg, Oral, Q4H PRN **OR** acetaminophen (TYLENOL) 160 MG/5ML solution 650 mg, 650 mg, Per Tube, Q4H PRN **OR** acetaminophen (TYLENOL) suppository 650 mg, 650 mg, Rectal, Q4H PRN, Amie Portland, MD .  labetalol (NORMODYNE) injection 20 mg, 20 mg, Intravenous, Once **AND** clevidipine (CLEVIPREX) infusion 0.5 mg/mL, 0-21 mg/hr, Intravenous, Continuous, Amie Portland, MD .  pantoprazole (PROTONIX) injection 40 mg, 40 mg, Intravenous, QHS, Amie Portland, MD .  senna-docusate (Senokot-S) tablet 1 tablet, 1 tablet, Oral, QHS PRN, Amie Portland, MD .  sodium  chloride flush (NS) 0.9 % injection 3 mL, 3 mL, Intravenous, Once, Pfeiffer, Marcy, MD  Current Outpatient Medications:  .  albuterol (VENTOLIN HFA) 108 (90 Base) MCG/ACT inhaler, INHALE 1-2 PUFFS INTO THE LUNGS EVERY 4 HOURS AS NEEDED FOR WHEEZING OR SHORTNESS OF BREATH. (Patient taking differently: Inhale 1-2 puffs into the lungs every 4 (four) hours as needed for wheezing or shortness of breath. INHALE 1-2 PUFFS INTO THE LUNGS EVERY 4 HOURS AS NEEDED FOR WHEEZING OR SHORTNESS OF BREATH.), Disp: 6.7 g, Rfl: 5 .  aspirin EC 81 MG EC tablet, Take 1 tablet (81 mg total) by mouth daily., Disp: 30 tablet, Rfl: 0 .  atorvastatin (LIPITOR) 10 MG tablet, TAKE 1 TABLET BY MOUTH EVERYDAY AT BEDTIME (Patient taking differently: Take 10 mg by mouth at bedtime. ), Disp: 90 tablet, Rfl: 3 .  Blood Glucose Calibration (ACCU-CHEK AVIVA) SOLN, USE AS INSTRUCTED TO TEST BLOOD SUGAR DAILY (Patient taking differently: 1 each by Other route daily. ), Disp: 1 each, Rfl: 2 .  Blood Glucose Monitoring Suppl (ACCU-CHEK AVIVA PLUS) w/Device KIT, Use as instructed to test blood sugar up to 3 times daily (Patient taking differently: 1 each by Other route 3 (three) times daily. ), Disp: 1 kit, Rfl: 0 .  famotidine (PEPCID) 40 MG tablet, TAKE 1 TABLET BY MOUTH TWICE A DAY (Patient taking differently: Take 40 mg by mouth 2 (two) times daily. ), Disp: 180 tablet, Rfl: 0 .  Fluticasone-Umeclidin-Vilant (TRELEGY ELLIPTA) 100-62.5-25 MCG/INH AEPB, Inhale 1 puff into the lungs daily., Disp: , Rfl:  .  furosemide (LASIX) 40 MG tablet, Take 1 tablet (40 mg total) by mouth daily., Disp: 30 tablet, Rfl: 11 .  glucose blood (ACCU-CHEK AVIVA PLUS) test strip, Use as instructed to test blood sugar up to 3 times daily, Disp: 300 each, Rfl: 1 .  isosorbide-hydrALAZINE (BIDIL) 20-37.5 MG tablet, Take 2 tablets by mouth 3 (three) times daily., Disp: 180 tablet, Rfl: 3 .  KLOR-CON M10 10 MEQ tablet, TAKE 2 TABLETS (20 MEQ TOTAL) BY MOUTH DAILY. FOR  LOW POTASSIUM. (Patient taking differently: Take 10 mEq by mouth daily. ), Disp: 60 tablet, Rfl: 3 .  Lancets (ACCU-CHEK SOFT TOUCH) lancets, Use as instructed to test blood sugar up to 3 times daily (Patient taking differently: 1 each by Other route 3 (three) times daily. ), Disp: 300 each, Rfl: 1 .  losartan (COZAAR) 100 MG tablet, Take 50 mg by mouth daily., Disp: , Rfl:  .  metoprolol succinate (TOPROL-XL) 25 MG 24 hr tablet, Take 1 tablet (25 mg total) by mouth daily., Disp: 30 tablet, Rfl: 1 .  mirtazapine (REMERON) 15 MG tablet, TAKE 1 TABLET BY MOUTH AT BEDTIME. FOR DEPRESSION AND APPETITE. (Patient taking differently: Take 15 mg by mouth at bedtime. ), Disp: 90 tablet, Rfl: 3 .  OXYGEN, Inhale 4 L into the lungs continuous. , Disp: , Rfl:  .  Polyethylene  Glycol 3350 (MIRALAX PO), Take 17 g by mouth as needed., Disp: , Rfl:  .  spironolactone (ALDACTONE) 25 MG tablet, Take 1 tablet (25 mg total) by mouth daily. Take at night (Patient taking differently: Take 25 mg by mouth every evening. ), Disp: 30 tablet, Rfl: 6  Exam: Current vital signs: Wt 44.9 kg   BMI 15.98 kg/m  Vital signs in last 24 hours: Weight:  [44.9 kg] 44.9 kg (11/02 1400)  GENERAL: Awake, alert in NAD HEENT: - Normocephalic and atraumatic, dry mm, no LN++, no Thyromegally LUNGS - Clear to auscultation bilaterally with no wheezes CV - S1S2 RRR, no m/r/g, equal pulses bilaterally. ABDOMEN - Soft, nontender, nondistended with normoactive BS Ext: warm, well perfused, intact peripheral pulses, no edema  NEURO:  Mental Status: AA&Ox3  Language: speech is mildly dysarthric naming, repetition, fluency, and comprehension intact. Cranial Nerves: PERRL. EOMI, visual fields full, left lower facial weakness, facial sensation intact, hearing intact, tongue/uvula/soft palate midline, normal sternocleidomastoid and trapezius muscle strength. No evidence of tongue atrophy or fibrillations Motor: Left upper extremity 4/5 with  drift.  Left lower extremity 4+/5 without drift.  Right side no drift. Tone: is normal and bulk is normal Sensation- Intact to light touch bilaterally Coordination: Finger-to-nose intact on the right, mildly ataxic-proportional to the weakness on the left. Gait- deferred.  NIHSS 1a Level of Conscious.: 0 1b LOC Questions: 0 1c LOC Commands: 0 2 Best Gaze: 0 3 Visual: 0 4 Facial Palsy: 1 5a Motor Arm - left: 1 5b Motor Arm - Right: 0 6a Motor Leg - Left: 0 6b Motor Leg - Right: 0 7 Limb Ataxia: 0 8 Sensory: 0 9 Best Language: 0 10 Dysarthria: 1 11 Extinct. and Inatten.: 0 TOTAL: 3    Labs I have reviewed labs in epic and the results pertinent to this consultation are: CBC    Component Value Date/Time   WBC 8.0 11/02/2018 0443   RBC 4.74 11/02/2018 0443   HGB 12.6 11/02/2018 0443   HCT 39.1 11/02/2018 0443   PLT 276 11/02/2018 0443   MCV 82.5 11/02/2018 0443   MCH 26.6 11/02/2018 0443   MCHC 32.2 11/02/2018 0443   RDW 15.1 11/02/2018 0443   LYMPHSABS 0.5 (L) 10/28/2018 2044   MONOABS 0.2 10/28/2018 2044   EOSABS 0.1 10/28/2018 2044   BASOSABS 0.1 10/28/2018 2044   CMP     Component Value Date/Time   NA 142 11/02/2018 0443   K 3.6 11/02/2018 0443   CL 95 (L) 11/02/2018 0443   CO2 34 (H) 11/02/2018 0443   GLUCOSE 113 (H) 11/02/2018 0443   BUN 35 (H) 11/02/2018 0443   CREATININE 1.12 (H) 11/02/2018 0443   CREATININE 0.84 09/28/2017 1559   CALCIUM 11.2 (H) 11/02/2018 0443   PROT 6.0 (L) 10/28/2018 2044   ALBUMIN 3.6 10/28/2018 2044   AST 32 10/28/2018 2044   ALT 24 10/28/2018 2044   ALKPHOS 83 10/28/2018 2044   BILITOT 0.7 10/28/2018 2044   GFRNONAA 49 (L) 11/02/2018 0443   GFRAA 56 (L) 11/02/2018 0443   Imaging I have reviewed the images obtained:  CT-scan of the brain-ASPECTS 10  Assessment: 73 year old with past history of diabetes hypertension hyperlipidemia colon cancer chronic respiratory failure, systolic CHF, COPD, anxiety and depression with  last known normal 12:45 PM with sudden onset of left-sided weakness. No cortical signs on exam. Suspect small vessel etiology stroke.  Plan: Acute Ischemic Stroke Acuity: Acute Current Suspected Etiology: Under investigation less likely small vessel  Continue Evaluation:  -Admit to: Neuro ICU -Hold Aspirin until 24 hour post tPA neuroimaging is stable and without evidence of bleeding -Blood pressure control, goal of SYS <180 -MRI/ECHO/A1C/Lipid panel. -Hyperglycemia management per SSI to maintain glucose 140-161m/dL. -PT/OT/ST therapies and recommendations when able  CNS -Close neuro monitoring  Dysarthria Dysphagia following cerebral infarction  -NPO until cleared by speech -ST -Advance diet as tolerated  Hemiplegia and hemiparesis following cerebral infarction affecting left non-dominant side  -PT/OT -PM&R consult  RESP Supplemental oxygen-history of chronic respiratory failure with hypoxia and recent CHF exacerbation. Saturating well on supplemental oxygen for now.  Will consult PCCM if deteriorates.  CV Blood pressure goals as above  Heart failure, unspecified Chronic systolic (congestive) heart failure  -Repeat TTE-1 was done few days ago but with a new stroke 1 to rule out any cardiac source like a LV thrombus given systolic dysfunction -Might need cards Consult  Hyperlipidemia, unspecified  - Statin for goal LDL < 70 -Continue home statin-atorvastatin 10  Multifocal atrial tachycardia noted on last admission. -Continue Toprol-XL 25 daily  HEME Labs pending   ENDO -goal HgbA1c < 7  GI/GU CKD 3-Gentle hydration -avoid nephrotoxic agents  Fluid/Electrolyte Disorders Check labs and replete as necessary.  ID Possible Aspiration PNA -CXR -NPO -Monitor  Nutrition E46 Protein-Calorie Malnutrition Mild Moderate Severe -diet consult  Prophylaxis DVT: SCDs GI: PPI Bowel: Docusate senna  Diet: NPO until cleared by speech/stroke swallow screen by  RN  Code Status: Full Code    THE FOLLOWING WERE PRESENT ON ADMISSION: Acute ischemic stroke Systolic CHF COPD Possible aspiration pneumonia  -- AAmie Portland MD [facility] Pager: [phone] 7pm to 7am, please call on call as listed on AMION.    CRITICAL CARE ATTESTATION Performed by: AAmie Portland MD Total critical care time: 45 minutes Critical care time was exclusive of separately billable procedures and treating other patients and/or supervising APPs/Residents/Students Critical care was necessary to treat or prevent imminent or life-threatening deterioration due to acute ischemic stroke This patient is critically ill and at significant risk for neurological worsening and/or death and care requires constant monitoring. Critical care was time spent personally by me on the following activities: development of treatment plan with patient and/or surrogate as well as nursing, discussions with consultants, evaluation of patient's response to treatment, examination of patient, obtaining history from patient or surrogate, ordering and performing treatments and interventions, ordering and review of laboratory studies, ordering and review of radiographic studies, pulse oximetry, re-evaluation of patient's condition, participation in multidisciplinary rounds and medical decision making of high complexity in the care of this patient.

## 2018-11-04 NOTE — Code Documentation (Signed)
73yo female arriving to [facility] via [place] at 1400. Patient from home where she was LKW at 1245. Patient noted left hand weakness and family noted facial weakness and slurred speech. EMS called and activated a code stroke for slurred speech and weakness. Stroke team at the bedside on patient arrival. Labs attempted but unsuccessful. Patient cleared for CT by ED resident. Patient to CT with team. CT completed. NIHSS 5, see documentation for details and code stroke times. Patient missed the month, left facial droop, mild left arm drift and mild dysarthria on exam. Patient with left hand weakness on exam. Decision to treat with tPA. BP within tPA parameters. 91m tPA bolus given over 1 minute at 1420 followed by 36.461mhr for a total of 40.60m12mer pharmacy dosing. Patient monitored frequently per post-tPA protocol. NS tPA flush hung. Patient to be admitted to ICU. Bedside handoff with ED RN LesMagda Paganini

## 2018-11-04 NOTE — Progress Notes (Signed)
PHARMACIST CODE STROKE RESPONSE  Notified to mix tPA at 1413 by Dr. Rory Percy Delivered tPA to RN at 1418  tPA dose = 4 mg bolus over 1 minute followed by 36.4 mg for a total dose of 40.53m over 1 hour  Issues/delays encountered (if applicable): N/A  GCristela Felt PharmD PGY1 Pharmacy Resident [employer]: [phone] 11/04/18 2:25 PM

## 2018-11-04 NOTE — Progress Notes (Signed)
Pt BP in systolic 964. Cleviprex started. C/O headache. Stat [place] ordered. Will sign out to oncoming team to follow up.  -- Amie Portland, MD

## 2018-11-04 NOTE — ED Notes (Signed)
Family of Pt at bedside in Trauma C

## 2018-11-04 NOTE — ED Notes (Signed)
Report given to [employer]

## 2018-11-05 ENCOUNTER — Inpatient Hospital Stay (HOSPITAL_COMMUNITY): Payer: Medicare HMO

## 2018-11-05 ENCOUNTER — Ambulatory Visit: Payer: Medicare HMO | Admitting: Internal Medicine

## 2018-11-05 DIAGNOSIS — I361 Nonrheumatic tricuspid (valve) insufficiency: Secondary | ICD-10-CM

## 2018-11-05 DIAGNOSIS — I63531 Cerebral infarction due to unspecified occlusion or stenosis of right posterior cerebral artery: Secondary | ICD-10-CM

## 2018-11-05 DIAGNOSIS — I639 Cerebral infarction, unspecified: Secondary | ICD-10-CM

## 2018-11-05 LAB — CBC
HCT: 39.1 % (ref 36.0–46.0)
Hemoglobin: 12 g/dL (ref 12.0–15.0)
MCH: 26.2 pg (ref 26.0–34.0)
MCHC: 30.7 g/dL (ref 30.0–36.0)
MCV: 85.4 fL (ref 80.0–100.0)
Platelets: 235 10*3/uL (ref 150–400)
RBC: 4.58 MIL/uL (ref 3.87–5.11)
RDW: 14.8 % (ref 11.5–15.5)
WBC: 6.1 10*3/uL (ref 4.0–10.5)
nRBC: 0 % (ref 0.0–0.2)

## 2018-11-05 LAB — BASIC METABOLIC PANEL
Anion gap: 13 (ref 5–15)
BUN: 30 mg/dL — ABNORMAL HIGH (ref 8–23)
CO2: 33 mmol/L — ABNORMAL HIGH (ref 22–32)
Calcium: 10.7 mg/dL — ABNORMAL HIGH (ref 8.9–10.3)
Chloride: 96 mmol/L — ABNORMAL LOW (ref 98–111)
Creatinine, Ser: 1.21 mg/dL — ABNORMAL HIGH (ref 0.44–1.00)
GFR calc Af Amer: 51 mL/min — ABNORMAL LOW (ref >60)
GFR calc non Af Amer: 44 mL/min — ABNORMAL LOW (ref >60)
Glucose, Bld: 158 mg/dL — ABNORMAL HIGH (ref 70–99)
Potassium: 3.4 mmol/L — ABNORMAL LOW (ref 3.5–5.1)
Sodium: 142 mmol/L (ref 135–145)

## 2018-11-05 LAB — LIPID PANEL
Cholesterol: 170 mg/dL (ref 0–200)
HDL: 62 mg/dL (ref >40)
LDL Cholesterol: 91 mg/dL (ref 0–99)
Total CHOL/HDL Ratio: 2.7 RATIO
Triglycerides: 85 mg/dL (ref <150)
VLDL: 17 mg/dL (ref 0–40)

## 2018-11-05 LAB — BPAM CRYOPRECIPITATE
Blood Product Expiration Date: 202011030259
Blood Product Expiration Date: 202011030259
ISSUE DATE / TIME: 202011022108
ISSUE DATE / TIME: 202011022108
Unit Type and Rh: 5100
Unit Type and Rh: 5100

## 2018-11-05 LAB — GLUCOSE, CAPILLARY
Glucose-Capillary: 241 mg/dL — ABNORMAL HIGH (ref 70–99)
Glucose-Capillary: 148 mg/dL — ABNORMAL HIGH (ref 70–99)
Glucose-Capillary: 179 mg/dL — ABNORMAL HIGH (ref 70–99)

## 2018-11-05 LAB — PREPARE CRYOPRECIPITATE
Unit division: 0
Unit division: 0

## 2018-11-05 LAB — ECHOCARDIOGRAM COMPLETE
Height: 66 in
Weight: 1583.78 oz

## 2018-11-05 LAB — HEMOGLOBIN A1C
Hgb A1c MFr Bld: 7 % — ABNORMAL HIGH (ref 4.8–5.6)
Mean Plasma Glucose: 154.2 mg/dL

## 2018-11-05 MED ORDER — SPIRONOLACTONE 25 MG PO TABS
25.0000 mg | ORAL_TABLET | Freq: Every evening | ORAL | Status: DC
  Administered 2018-11-05 – 2018-11-08 (×4): 25 mg via ORAL
  Filled 2018-11-05 (×4): qty 1

## 2018-11-05 MED ORDER — MIRTAZAPINE 15 MG PO TABS
15.0000 mg | ORAL_TABLET | Freq: Every day | ORAL | Status: DC
  Administered 2018-11-05 – 2018-11-07 (×3): 15 mg via ORAL
  Filled 2018-11-05 (×3): qty 1

## 2018-11-05 MED ORDER — FUROSEMIDE 40 MG PO TABS
40.0000 mg | ORAL_TABLET | Freq: Every day | ORAL | Status: DC
  Administered 2018-11-05 – 2018-11-06 (×2): 40 mg via ORAL
  Filled 2018-11-05 (×2): qty 1

## 2018-11-05 MED ORDER — INSULIN ASPART 100 UNIT/ML ~~LOC~~ SOLN: 0.0000 [IU] | Freq: Every day | SUBCUTANEOUS | Status: DC

## 2018-11-05 MED ORDER — FAMOTIDINE 20 MG PO TABS
20.0000 mg | ORAL_TABLET | Freq: Two times a day (BID) | ORAL | Status: DC
  Administered 2018-11-05: 20 mg via ORAL
  Filled 2018-11-05 (×2): qty 1

## 2018-11-05 MED ORDER — INSULIN ASPART 100 UNIT/ML ~~LOC~~ SOLN
0.0000 [IU] | Freq: Three times a day (TID) | SUBCUTANEOUS | Status: DC
  Administered 2018-11-05: 5 [IU] via SUBCUTANEOUS
  Administered 2018-11-05: 2 [IU] via SUBCUTANEOUS
  Administered 2018-11-06 (×2): 3 [IU] via SUBCUTANEOUS
  Administered 2018-11-07: 5 [IU] via SUBCUTANEOUS
  Administered 2018-11-08 (×2): 3 [IU] via SUBCUTANEOUS

## 2018-11-05 MED ORDER — ISOSORB DINITRATE-HYDRALAZINE 20-37.5 MG PO TABS
2.0000 | ORAL_TABLET | Freq: Three times a day (TID) | ORAL | Status: DC
  Administered 2018-11-05 – 2018-11-08 (×8): 2 via ORAL
  Filled 2018-11-05 (×12): qty 2

## 2018-11-05 MED ORDER — CHLORHEXIDINE GLUCONATE CLOTH 2 % EX PADS
6.0000 | MEDICATED_PAD | Freq: Every day | CUTANEOUS | Status: DC
  Administered 2018-11-05: 6 via TOPICAL

## 2018-11-05 MED ORDER — FAMOTIDINE 20 MG PO TABS: 40.0000 mg | ORAL_TABLET | Freq: Two times a day (BID) | ORAL | Status: DC

## 2018-11-05 NOTE — Progress Notes (Signed)
STROKE TEAM PROGRESS NOTE   INTERVAL HISTORY Pt lying in bed, just had CUS test done. Pt awake alert, orientated and following all commands. Seems to have left facial droop, left hand clumsy but no significant ataxia. Had HA last night and CT showed HT from right cerebellar, fibrinogen 160 and TXA given. Repeat CT this am stable hematoma. BP under control with cleviprex. She stated that her HA much improved this am.   Vitals:   11/05/18 0600 11/05/18 0630 11/05/18 0700 11/05/18 0726  BP: (!) 169/100 (!) 178/103 (!) 186/100 (!) 186/100  Pulse: (!) 55 (!) 57 (!) 57 (!) 58  Resp: 19 (!) _0 Temp:      TempSrc:      SpO2: 100% 97% 97% 97%  Weight:      Height:        CBC:  Recent Labs  Lab 11/02/18 0443 11/04/18 1500  WBC 8.0 5.3  NEUTROABS  --  3.3  HGB 12.6 15.5*  HCT 39.1 52.3*  MCV 82.5 87.9  PLT 276 716    Basic Metabolic Panel:  Recent Labs  Lab 11/01/18 0417 11/02/18 0443 11/04/18 1500  NA 141 142 141  K 3.7 3.6 3.6  CL 94* 95* 96*  CO2 32 34* 31  GLUCOSE 150* 113* 183*  BUN 31* 35* 31*  CREATININE 1.18* 1.12* 1.36*  CALCIUM 11.2* 11.2* 11.5*  MG 2.0 1.9  --    Lipid Panel:     Component Value Date/Time   CHOL 170 11/05/2018 0602   TRIG 85 11/05/2018 0602   HDL 62 11/05/2018 0602   CHOLHDL 2.7 11/05/2018 0602   VLDL 17 11/05/2018 0602   LDLCALC 91 11/05/2018 0602   HgbA1c:  Lab Results  Component Value Date   HGBA1C 7.0 (H) 11/05/2018   Urine Drug Screen: No results found for: LABOPIA, COCAINSCRNUR, LABBENZ, AMPHETMU, THCU, LABBARB  Alcohol Level No results found for: ETH  IMAGING  Ct Head Wo Contrast 11/05/2018 0311 1. No significant interval change in size of hemorrhagic right cerebellar infarct with similar mass regional mass effect. No hydrocephalus or ventricular trapping. 2. No other new acute intracranial abnormality.   Ct Head Wo Contrast 11/04/2018 1953 1. Acute to subacute right cerebellar infarct with interval hemorrhagic  transformation. Dominant intraparenchymal hemorrhage measures approximately 3.2 x 3.0 x 1.6 cm (estimated volume 8 CC). Associated regional mass effect throughout the right posterior fossa without obstructive hydrocephalus. 2. No other new acute intracranial abnormality.    Ct Head Code Stroke Wo Contrast 11/04/2018 1435 1. No acute intracranial hemorrhage. Suspected age-indeterminate right cerebellar infarction. 2. ASPECTS is 10 3. Chronic microvascular ischemic changes. Small chronic left frontal infarct.    PHYSICAL EXAM  Temp:  [97.6 F (36.4 C)-98.6 F (37 C)] 97.7 F (36.5 C) (11/03 0800) Pulse Rate:  [26-124] 56 (11/03 0830) Resp:  [12-23] 20 (11/03 0830) BP: (122-204)/(65-126) 132/77 (11/03 0830) SpO2:  [86 %-100 %] 97 % (11/03 0830) Weight:  [44.9 kg] 44.9 kg (11/02 1400)  General - Well nourished, well developed, in no apparent distress.  Ophthalmologic - fundi not visualized due to noncooperation.  Cardiovascular - Regular rhythm and rate.  Mental Status -  Level of arousal and orientation to time, place, and person were intact. Language including expression, naming, repetition, comprehension was assessed and found intact. Mild dysarthria  Cranial Nerves II - XII - II - Visual field intact OU. III, IV, VI - Extraocular movements intact. V - Facial sensation intact bilaterally.  VII - left facial droop. VIII - Hearing & vestibular intact bilaterally. X - Palate elevates symmetrically, mild dysarthria. XI - Chin turning & shoulder shrug intact bilaterally. XII - Tongue protrusion intact.  Motor Strength - The patient's strength was normal in all extremities except left hand decreased dexterity and pronator drift was absent.  Bulk was normal and fasciculations were absent.   Motor Tone - Muscle tone was assessed at the neck and appendages and was normal.  Reflexes - The patient's reflexes were symmetrical in all extremities and she had no pathological  reflexes.  Sensory - Light touch, temperature/pinprick were assessed and were symmetrical.    Coordination - The patient had normal movements in the right hand and left leg with no ataxia or dysmetria, slight dysmetria on the left FTN and right HTS.  Tremor was absent.  Gait and Station - deferred.   ASSESSMENT/PLAN [name] is a 73 y.o. female with history of diabetes, hypertension, hyperlipidemia, colon cancer, COPD, chronic respiratory failure with hypoxia, systolic CHF with DOE presenting with slurred speech and left sided weakness. Recently hospitalized for acute on chronic respiratory failure w/ hypoxia and hypercapnia, likely COPD exacerbation and diastolic HF. Has ongoing SOB.   Stroke:   R cerebellar infarct s/p tPA w/ hemorrhagic transformation, infarct likely embolic secondary to unknown source  Code Stroke CT head No acute abnormality. Old L frontal lobe infarct. Small vessel disease.   CT head R cerebellar infarct w/ interval hemorrhagic transformation 8cc w/ mass effect.  CT head repeat no significant change   MRI  pending  MRA  pending  Carotid Doppler  Pending   2D Echo pending  LDL 91  HgbA1c 7.0  SCDs for VTE prophylaxis  aspirin 81 mg daily prior to admission, now on No antithrombotic as within 24h of tPA administration and post tPA hemorrhage   Therapy recommendations:  pending   Disposition:  pending   Post tPA hemorrhagic conversion  HA with elevated BP, BP up to > 200 after tPA  [facility] w/ hemorrhagic transformation site of infarct   Fibrinogen 160  Received TXA  Repeat CT stable hematoma  MRI and MRA pending  Hypertensive Urgency  On Cleviprex  Home meds:  isosorbide-HCTZ 20-37.5, lasix 40, losartan 50, metoprolol XR 25, spironolactone 25 BP goal < 140 given hemorrhagic transformation  . Long-term BP goal normotensive  Hyperlipidemia  Home meds:  lipitor 10, resumed in hospital  LDL 91, goal < 70  Continue statin  at discharge  Diabetes type II Controlled  Home meds:  None listed  HgbA1c 7.0, goal < 7.0  CBGs  SSI  Close PCP follow up  Other Stroke Risk Factors  Advanced age  Former Cigarette smoker, quit 31 yrs ago  Diastolic Congestive heart failure  Other Active Problems  Chronic respiratory failure w/ hypoxia  COPD  Anxiety and depression  Hx colon cancer  Primary hyperparathyroidism   CKD stage III, Cre 1.12->1.36  Hospital day # 1  This patient is critically ill due to stroke s/p tPA with hemorrhagic conversion, hypertensive emergency, cerebellar edema and at significant risk of neurological worsening, death form cerebellar edema, brain herniation, hydrocephalus, heart failure. This patient's care requires constant monitoring of vital signs, hemodynamics, respiratory and cardiac monitoring, review of multiple databases, neurological assessment, discussion with family, other specialists and medical decision making of high complexity. I spent 40 minutes of neurocritical care time in the care of this patient.  Rosalin Hawking, MD PhD Stroke Neurology 11/05/2018  9:56 AM    To contact Stroke Continuity provider, please refer to [email or web address] After hours, contact General Neurology

## 2018-11-05 NOTE — Progress Notes (Signed)
Initial Nutrition Assessment  DOCUMENTATION CODES:   Underweight, Severe malnutrition in context of chronic illness  INTERVENTION:   Monitor plan of care    NUTRITION DIAGNOSIS:   Severe Malnutrition related to chronic illness(COPD, CHF) as evidenced by severe fat depletion, severe muscle depletion.  GOAL:   Patient will meet greater than or equal to 90% of their needs  MONITOR:   Diet advancement  REASON FOR ASSESSMENT:        ASSESSMENT:   Pt with PMH of DM, HTN, HLD, colon cancer, COPD, chronic respiratory failure with hypoxia, CHF who was admitted with acute ischemic stroke s/p tPA.   CT of head demonstrates acute hemorrhage in R cerebellar hemisphere at significant risk for hematoma expansion and subsequent herniation.  tPA reversal protocol initiated Cleviprex restarted   Medications reviewed  Labs reviewed    NUTRITION - FOCUSED PHYSICAL EXAM:  Deferred; RD working remotely   Diet Order:   Diet Order            Diet Carb Modified Fluid consistency: Thin; Room service appropriate? Yes  Diet effective now              EDUCATION NEEDS:   No education needs have been identified at this time  Skin:  Skin Assessment: Reviewed RN Assessment  Last BM:  11/1  Height:   Ht Readings from Last 1 Encounters:  11/04/18 5' 6" (1.676 m)    Weight:   Wt Readings from Last 1 Encounters:  11/04/18 44.9 kg    Ideal Body Weight:  59.1 kg  BMI:  Body mass index is 15.98 kg/m.  Estimated Nutritional Needs:   Kcal:  1400-1600  Protein:  60-75 grams  Fluid:  > 1.5 L/day  Maylon Peppers RD, LDN, CNSC [phone] Pager [phone] After Hours Pager

## 2018-11-05 NOTE — Progress Notes (Signed)
Occupational Therapy Discharge Patient Details Name: Denise [name]. Bey MRN: [id number] DOB: [birth year] Today's Date: 11/05/2018 Time:  -     Patient discharged from OT services secondary to medical decline - will need to re-order OT to resume therapy services.  Please see latest therapy progress note for current level of functioning and progress toward goals.    Noted patient now with cerebellar hemorrhage with "The appearance presents significant risk for hematoma expansion, peri-hematoma edema and possible mass effect upon the brainstem with high likelihood for subsequent herniation."  OT is signing off and will need new order when pt is appropriate for activity. Thank you  Progress and discharge plan discussed with patient and/or caregiver: Patient unable to participate in discharge planning and no caregivers available  [place], OTR/L  Acute Rehabilitation Services Pager: [phone] Office: [phone] .  11/05/2018, 8:47 AM

## 2018-11-05 NOTE — Progress Notes (Signed)
Echocardiogram 2D Echocardiogram has been performed.  Denise [name] 11/05/2018, 9:37 AM

## 2018-11-05 NOTE — Progress Notes (Signed)
PT Cancellation Note  Patient Details Name: Denise [name] MRN: [id number] DOB: [birth year]   Cancelled Treatment:    Reason Eval/Treat Not Completed: Patient not medically ready  Noted patient now with cerebellar hemorrhage with "The appearance presents significant risk for hematoma expansion, peri-hematoma edema and possible mass effect upon the brainstem with high likelihood for subsequent herniation."  PT is signing off and will need new order when pt is appropriate for activity. Thank you   Barry Brunner, PT Pager [phone]   Rexanne Mano 11/05/2018, 8:15 AM

## 2018-11-05 NOTE — Progress Notes (Signed)
Carotid artery duplex completed.  11/05/2018 9:45 AM Kelby Aline., MHA, RVT, RDCS, RDMS

## 2018-11-05 NOTE — Evaluation (Signed)
Speech Language Pathology Evaluation Patient Details Name: Denise [name]. Warning MRN: [id number] DOB: 08-02-1945 Today's Date: 11/05/2018 Time: 5681-2751 SLP Time Calculation (min) (ACUTE ONLY): 14 min  Problem List:  Patient Active Problem List   Diagnosis Date Noted  . Acute ischemic stroke ([place]) 11/04/2018  . COPD exacerbation ([place]) 10/28/2018  . Acute on chronic systolic CHF (congestive heart failure) ([place]) 10/28/2018  . Elevated troponin 10/28/2018  . Lactic acidosis 10/28/2018  . GERD (gastroesophageal reflux disease) 10/28/2018  . Acute on chronic respiratory failure with hypercapnia ([place]) 10/28/2018  . Diabetes mellitus without complication-diet controled 10/28/2018  . On home oxygen therapy 07/12/2018  . CHF (congestive heart failure) ([place]) 05/16/2018  . Chronic respiratory failure with hypoxia and hypercapnia ([place]) 05/03/2018  . Borderline abnormal TFTs 05/02/2018  . Type 2 diabetes mellitus ([place]) 04/26/2018  . Unintended weight loss 04/26/2018  . Lower extremity edema 02/20/2018  . Chronic respiratory failure with hypoxia ([place]) 10/02/2017  . Hematuria 09/27/2017  . Pulmonary hypertension ([name]) c/w cor pulmonale   . Protein-calorie malnutrition, severe 09/17/2017  . Constipation   . Hyperparathyroidism, primary ([place]) 07/27/2016  . Loss of appetite 06/02/2016  . COPD GOLD  II   11/22/2015  . Vitamin D deficiency 11/22/2015  . Anxiety and depression 11/22/2015  . Insomnia 11/22/2015  . Hyperlipidemia 11/22/2015  . Essential hypertension 11/22/2015  . Hx of colonic polyps 02/10/2013  . History of colon cancer 06/11/1986   Past Medical History:  Past Medical History:  Diagnosis Date  . Altered mental status   . Anxiety and depression   . Chronic respiratory failure with hypoxia ([place]) 10/02/2017   Assoc with ? Cor pulmonale dx 09/2017 so rx 02 1-2 lpm 24/7  - 10/17/2017  Walked RA x 3 laps @ 185 ft each stopped due to  End of study, nl pace,  desat to 87 on 3rd lap -  12/19/2017  Saturations on Room Air at Rest =91 % and while Ambulating = 87%  But  on  2 Liters of pulsed oxygen while Ambulating =93% so rec POC 2lpm walking / use at rest if sats under 90%      . Colon cancer ([place]) 1988   Resected  . COPD (chronic obstructive pulmonary disease) ([place])   . Diabetes mellitus without complication ([name])    diet controlled- no meds. per pt  . Diarrhea 04/02/2018  . Diverticulosis   . Hyperlipidemia   . Hypertension   . Hypoxia 10/30/2018  . Insomnia   . Primary hyperparathyroidism ([place])   . SBO (small bowel obstruction) ([place])   . Tubular adenoma of rectum 02/23/2013   low grade  . Vitamin D deficiency    Past Surgical History:  Past Surgical History:  Procedure Laterality Date  . ABDOMINAL HYSTERECTOMY  08/2015  . BREAST BIOPSY Left   . BREAST EXCISIONAL BIOPSY Right   . COLON RESECTION  1980's  . COLONOSCOPY    . ENDOMETRIAL BIOPSY  2009   negative  . INCONTINENCE SURGERY  2017  . RIGHT HEART CATH N/A 09/20/2017   Procedure: RIGHT HEART CATH;  Surgeon: Larey Dresser, MD;  Location: [facility];  Service: Cardiovascular;  Laterality: N/A;  . RIGHT/LEFT HEART CATH AND CORONARY ANGIOGRAPHY N/A 03/19/2018   Procedure: RIGHT/LEFT HEART CATH AND CORONARY ANGIOGRAPHY;  Surgeon: Larey Dresser, MD;  Location: [facility];  Service: Cardiovascular;  Laterality: N/A;   HPI:  STAT CT head reveals acute hemorrhage in right cerebellar hemisphere, moderate in  size. The appearance presents significant risk for hematoma expansion, peri-hematoma edema and possible mass effect upon the brainstem with high likelihood for subsequent herniation.    Assessment / Plan / Recommendation Clinical Impression   Pt was alert, pleasant and conversant throughout cognitive assessment. [name] present and pt frequently looks to him for general answers. Pt is functionally oriented to season, place and general situation. She is able to recall pending MRI and  brain bleed (in general terms). Pt did repeat herself several times, unsure if related to memory deficit or if she was making sure that this write wasn't changing the plans that were already established. Pt demonstrates good attention and is able to communicate wants/needs. while pt's husband is present, it was difficult to assertain baseline information. Would recommend ST follow pt to establish baseline and to also help with potential cognitive deficits that might arise given etiology. ST recommended 1 x week.     SLP Assessment  SLP Recommendation/Assessment: Patient needs continued Speech Lanaguage Pathology Services SLP Visit Diagnosis: Cognitive communication deficit (R41.841)    Follow Up Recommendations  (TBD)    Frequency and Duration min 1 x/week  2 weeks      SLP Evaluation Cognition  Overall Cognitive Status: Difficult to assess Arousal/Alertness: Awake/alert Orientation Level: Oriented to person;Oriented to place;Oriented to time;Oriented to situation;Other (comment) Attention: Selective Selective Attention: Appears intact Memory: Impaired Memory Impairment: Other (comment)(benefits from repetition of information) Awareness: Impaired Awareness Impairment: (intermittent recall of immediate plans)       Comprehension  Auditory Comprehension Overall Auditory Comprehension: Appears within functional limits for tasks assessed Visual Recognition/Discrimination Discrimination: Within Function Limits Reading Comprehension Reading Status: Not tested    Expression Expression Primary Mode of Expression: Verbal Verbal Expression Overall Verbal Expression: Appears within functional limits for tasks assessed Written Expression Dominant Hand: Right Written Expression: Not tested   Oral / Motor  Oral Motor/Sensory Function Overall Oral Motor/Sensory Function: Mild impairment Facial ROM: Reduced left Facial Symmetry: Abnormal symmetry left Facial Strength: Reduced  left Motor Speech Overall Motor Speech: Appears within functional limits for tasks assessed Respiration: Within functional limits Phonation: Normal Resonance: Within functional limits Articulation: Within functional limitis Intelligibility: Intelligible Motor Planning: Witnin functional limits Motor Speech Errors: Not applicable   GO                    Marita Burnsed 11/05/2018, 5:36 PM

## 2018-11-06 ENCOUNTER — Other Ambulatory Visit: Payer: Self-pay

## 2018-11-06 ENCOUNTER — Encounter (HOSPITAL_COMMUNITY): Payer: Self-pay | Admitting: *Deleted

## 2018-11-06 ENCOUNTER — Inpatient Hospital Stay (HOSPITAL_COMMUNITY): Payer: Medicare HMO

## 2018-11-06 DIAGNOSIS — I639 Cerebral infarction, unspecified: Secondary | ICD-10-CM

## 2018-11-06 LAB — BASIC METABOLIC PANEL
Anion gap: 8 (ref 5–15)
BUN: 23 mg/dL (ref 8–23)
CO2: 35 mmol/L — ABNORMAL HIGH (ref 22–32)
Calcium: 10.5 mg/dL — ABNORMAL HIGH (ref 8.9–10.3)
Chloride: 99 mmol/L (ref 98–111)
Creatinine, Ser: 0.99 mg/dL (ref 0.44–1.00)
GFR calc Af Amer: >60 mL/min (ref >60)
GFR calc non Af Amer: 57 mL/min — ABNORMAL LOW (ref >60)
Glucose, Bld: 128 mg/dL — ABNORMAL HIGH (ref 70–99)
Potassium: 3.5 mmol/L (ref 3.5–5.1)
Sodium: 142 mmol/L (ref 135–145)

## 2018-11-06 LAB — GLUCOSE, CAPILLARY
Glucose-Capillary: 114 mg/dL — ABNORMAL HIGH (ref 70–99)
Glucose-Capillary: 167 mg/dL — ABNORMAL HIGH (ref 70–99)
Glucose-Capillary: 171 mg/dL — ABNORMAL HIGH (ref 70–99)
Glucose-Capillary: 93 mg/dL (ref 70–99)
Glucose-Capillary: 95 mg/dL (ref 70–99)

## 2018-11-06 LAB — CBC
HCT: 37.3 % (ref 36.0–46.0)
Hemoglobin: 11.8 g/dL — ABNORMAL LOW (ref 12.0–15.0)
MCH: 26.2 pg (ref 26.0–34.0)
MCHC: 31.6 g/dL (ref 30.0–36.0)
MCV: 82.7 fL (ref 80.0–100.0)
Platelets: 231 10*3/uL (ref 150–400)
RBC: 4.51 MIL/uL (ref 3.87–5.11)
RDW: 14.6 % (ref 11.5–15.5)
WBC: 5 10*3/uL (ref 4.0–10.5)
nRBC: 0 % (ref 0.0–0.2)

## 2018-11-06 MED ORDER — FAMOTIDINE 20 MG PO TABS
20.0000 mg | ORAL_TABLET | Freq: Every day | ORAL | Status: DC
  Administered 2018-11-06 – 2018-11-08 (×3): 20 mg via ORAL
  Filled 2018-11-06 (×4): qty 1

## 2018-11-06 MED ORDER — ATORVASTATIN CALCIUM 10 MG PO TABS
20.0000 mg | ORAL_TABLET | Freq: Every day | ORAL | Status: DC
  Administered 2018-11-06 – 2018-11-07 (×2): 20 mg via ORAL
  Filled 2018-11-06 (×2): qty 2

## 2018-11-06 MED ORDER — CHLORHEXIDINE GLUCONATE CLOTH 2 % EX PADS
6.0000 | MEDICATED_PAD | Freq: Every day | CUTANEOUS | Status: DC
  Administered 2018-11-07 – 2018-11-08 (×2): 6 via TOPICAL

## 2018-11-06 MED ORDER — HYDRALAZINE HCL 20 MG/ML IJ SOLN: 5.0000 mg | INTRAMUSCULAR | Status: DC | PRN

## 2018-11-06 NOTE — Progress Notes (Signed)
STROKE TEAM PROGRESS NOTE   INTERVAL HISTORY Pt lying in bed. No family at bedside. Pt has been doing well, no acute event overnight. MRI showed right cerebellar HT with right MCA small scattered infarcts. MRA unremarkable.   Vitals:   11/06/18 0600 11/06/18 0700 11/06/18 0800 11/06/18 0808  BP: 119/64 131/86 (!) 145/122   Pulse: 60 (!) 57 61   Resp: (!) 30 19 (!) 23   Temp:   98 F (36.7 C)   TempSrc:   Oral   SpO2: 99% 100% 95% 97%  Weight:      Height:        CBC:  Recent Labs  Lab 11/04/18 1500 11/05/18 1005 11/06/18 0554  WBC 5.3 6.1 5.0  NEUTROABS 3.3  --   --   HGB 15.5* 12.0 11.8*  HCT 52.3* 39.1 37.3  MCV 87.9 85.4 82.7  PLT 276 235 563    Basic Metabolic Panel:  Recent Labs  Lab 11/01/18 0417 11/02/18 0443  11/05/18 1005 11/06/18 0554  NA 141 142   < > 142 142  K 3.7 3.6   < > 3.4* 3.5  CL 94* 95*   < > 96* 99  CO2 32 34*   < > 33* 35*  GLUCOSE 150* 113*   < > 158* 128*  BUN 31* 35*   < > 30* 23  CREATININE 1.18* 1.12*   < > 1.21* 0.99  CALCIUM 11.2* 11.2*   < > 10.7* 10.5*  MG 2.0 1.9  --   --   --    < > = values in this interval not displayed.   Lipid Panel:     Component Value Date/Time   CHOL 170 11/05/2018 0602   TRIG 85 11/05/2018 0602   HDL 62 11/05/2018 0602   CHOLHDL 2.7 11/05/2018 0602   VLDL 17 11/05/2018 0602   LDLCALC 91 11/05/2018 0602   HgbA1c:  Lab Results  Component Value Date   HGBA1C 7.0 (H) 11/05/2018   Urine Drug Screen: No results found for: LABOPIA, COCAINSCRNUR, LABBENZ, AMPHETMU, THCU, LABBARB  Alcohol Level No results found for: [facility]  IMAGING Mr Brain Wo Contrast 11/05/2018 1738 1. Large acute/subacute hemorrhagic right cerebellar infarct not significantly changed from head CT earlier the same day. Posterior fossa mass effect is also similar with partial effacement of the fourth ventricle. No hydrocephalus 2. Multiple additional small acute/subacute cortical infarcts within the right MCA vascular territory, some  of which demonstrate subtle petechial hemorrhage. 3. Multiple small chronic hemorrhagic and nonhemorrhagic cortical infarcts within the bilateral cerebral hemispheres. 4. Small chronic lacunar infarcts within the right thalamus and left cerebellum. Background of mild chronic small vessel ischemic disease.   Mr Angio Head Wo Contrast 11/05/2018 1. No intracranial large vessel occlusion. 2. Intracranial atherosclerotic disease. Most notably, there are sites of up to moderate stenosis within the non dominant left left vertebral artery. Additional mild stenoses as described.   Ct Head Wo Contrast 11/05/2018 0311 1. No significant interval change in size of hemorrhagic right cerebellar infarct with similar mass regional mass effect. No hydrocephalus or ventricular trapping. 2. No other new acute intracranial abnormality.   Ct Head Wo Contrast 11/04/2018 1953 1. Acute to subacute right cerebellar infarct with interval hemorrhagic transformation. Dominant intraparenchymal hemorrhage measures approximately 3.2 x 3.0 x 1.6 cm (estimated volume 8 CC). Associated regional mass effect throughout the right posterior fossa without obstructive hydrocephalus. 2. No other new acute intracranial abnormality.    Ct Head Code Stroke  Wo Contrast 11/04/2018 1435 1. No acute intracranial hemorrhage. Suspected age-indeterminate right cerebellar infarction. 2. ASPECTS is 10 3. Chronic microvascular ischemic changes. Small chronic left frontal infarct.   Vas US Carotid 11/05/2018 Right Carotid: Velocities in the right ICA are consistent with a 1-39% stenosis.  Left Carotid: Velocities in the left ICA are consistent with a 1-39% stenosis. Vertebrals:  Bilateral vertebral arteries demonstrate antegrade flow. Subclavians: Normal flow hemodynamics were seen in bilateral subclavian arteries.   2D Echocardiogram  1. Left ventricular ejection fraction, by visual estimation, is 60 to 65%. The left ventricle has normal function.  There is severely increased left ventricular hypertrophy.  2. Left ventricular diastolic parameters are consistent with Grade I diastolic dysfunction (impaired relaxation).  3. Global right ventricle has moderately reduced systolic function.The right ventricular size is normal.  4. Left atrial size was mildly dilated.  5. Right atrial size was normal.  6. Trivial pericardial effusion is present.  7. The mitral valve is abnormal. Trace mitral valve regurgitation. No evidence of mitral stenosis.  8. The tricuspid valve is normal in structure. Tricuspid valve regurgitation is mild.  9. The aortic valve is tricuspid. Aortic valve regurgitation is trivial. Mild aortic valve sclerosis without stenosis. 10. The pulmonic valve was normal in structure. Pulmonic valve regurgitation is not visualized. 11. Moderately elevated pulmonary artery systolic pressure. 12. The inferior vena cava is normal in size with greater than 50% respiratory variability, suggesting right atrial pressure of 3 mmHg. 13. Normal LV systolic function; grade 1 diastolic dysfunction; severe LVH with speckled appearance; consider amyloid; mild LAE; moderate RV dysfunction; trace AI.  PHYSICAL EXAM   Temp:  [97.4 F (36.3 C)-98.6 F (37 C)] 98 F (36.7 C) (11/04 0800) Pulse Rate:  [52-76] 61 (11/04 0800) Resp:  [13-30] 23 (11/04 0800) BP: (92-145)/(47-122) 145/122 (11/04 0800) SpO2:  [91 %-100 %] 97 % (11/04 0808)  General - Well nourished, well developed, in no apparent distress.  Ophthalmologic - fundi not visualized due to noncooperation.  Cardiovascular - Regular rhythm and rate.  Mental Status -  Level of arousal and orientation to time, place, and person were intact. Language including expression, naming, repetition, comprehension was assessed and found intact. Mild dysarthria  Cranial Nerves II - XII - II - Visual field intact OU. III, IV, VI - Extraocular movements intact. V - Facial sensation intact  bilaterally. VII - left facial droop. VIII - Hearing & vestibular intact bilaterally. X - Palate elevates symmetrically, mild dysarthria. XI - Chin turning & shoulder shrug intact bilaterally. XII - Tongue protrusion intact.  Motor Strength - The patient's strength was normal in all extremities except left bicep 4/5, left wrist extension 4-/5 and left hand grip 3+/5 and pronator drift was absent.  Bulk was normal and fasciculations were absent.   Motor Tone - Muscle tone was assessed at the neck and appendages and was normal.  Reflexes - The patient's reflexes were symmetrical in all extremities and she had no pathological reflexes.  Sensory - Light touch, temperature/pinprick were assessed and were symmetrical.    Coordination - The patient had normal movements in the right hand and left leg with no ataxia or dysmetria.  Tremor was absent.  Gait and Station - deferred.   ASSESSMENT/PLAN Ms. Ivana A. Asberry is a 73 y.o. female with history of diabetes, hypertension, hyperlipidemia, colon cancer, COPD, chronic respiratory failure with hypoxia, systolic CHF with DOE presenting with slurred speech and left sided weakness. Recently hospitalized for acute on chronic respiratory  failure w/ hypoxia and hypercapnia, likely COPD exacerbation and diastolic HF. Has ongoing SOB.   Stroke:   R cerebellar and R MCA infarct s/p tPA w/ right cerebellar hemorrhagic transformation, infarct likely embolic secondary to unknown source  Code Stroke CT head No acute abnormality. Old L frontal lobe infarct. Small vessel disease.   CT head R cerebellar infarct w/ interval hemorrhagic transformation 8cc w/ mass effect.  CT head repeat no significant change   MRI  Large R cerebellar infarct w/ posterior mass effect. Small cortical infarcts R MCA some with petechial hemorrhage. Multiple old small hemorrhagic and non-hemorrhagic B cortical infarcts. Old R thalamic and L cerebellar lacunes.   MRA  No LVO.  Intracranial atherosclerosis (most significant is moderate L VA  Repeat CT in am for HT evaluation   Carotid Doppler  B ICA 1-39% stenosis, VAs antegrade   2D Echo EF 60-65%. No source of embolus. Consider amyloidosis   LE venous doppler negative for DVT  Will consider loop recorder to rule out afib near discharge  LDL 91  HgbA1c 7.0  SCDs for VTE prophylaxis  aspirin 81 mg daily prior to admission, now on No antithrombotic due to HT. Consider ASA 81 if repeat CT stable   Therapy recommendations:  pending   Disposition:  pending   Post tPA hemorrhagic conversion  HA with elevated BP, BP up to > 200 after tPA  [facility] w/ hemorrhagic transformation site of infarct   Fibrinogen 160  Received TXA  Repeat CT stable hematoma  MRI and MRA stable hemorrhage. No LVO. See above  CT repeat in am - consider ASA if HT stable  Hypertensive Urgency  On Cleviprex  Home meds:  isosorbide-HCTZ 20-37.5, lasix 40, losartan 50, metoprolol XR 25, spironolactone 25 BP goal < 140 given hemorrhagic transformation  . Home meds resumed except for losartan . BP stable now . Long-term BP goal normotensive  Hyperlipidemia  Home meds:  lipitor 10  LDL 91, goal < 70  Increase lipitor to 20  Continue statin at discharge  Diabetes type II Controlled  Home meds:  None listed  HgbA1c 7.0, goal < 7.0  CBGs  SSI  Close PCP follow up  Other Stroke Risk Factors  Advanced age  Former Cigarette smoker, quit 31 yrs ago  Diastolic Congestive heart failure  Other Active Problems  Chronic respiratory failure w/ hypoxia  COPD  Anxiety and depression  Hx colon cancer  Primary hyperparathyroidism   CKD stage III, Cre 1.12->1.36->1.21->0.99   Severe malnutrition d/t chronic illness, Body mass index is 15.98 kg/m.   Hospital day # 2  This patient is critically ill due to stroke s/p tPA with hemorrhagic conversion, hypertensive emergency, cerebellar edema and at significant  risk of neurological worsening, death form cerebellar edema, brain herniation, hydrocephalus, heart failure. This patient's care requires constant monitoring of vital signs, hemodynamics, respiratory and cardiac monitoring, review of multiple databases, neurological assessment, discussion with family, other specialists and medical decision making of high complexity. I spent 35 minutes of neurocritical care time in the care of this patient.  Rosalin Hawking, MD PhD Stroke Neurology 11/06/2018 9:17 AM    To contact Stroke Continuity provider, please refer to [email or web address] After hours, contact General Neurology

## 2018-11-06 NOTE — Evaluation (Signed)
Physical Therapy Evaluation Patient Details Name: Denise [name]. [place] MRN: [id number] DOB: [birth year] Today's Date: 11/06/2018   History of Present Illness  Denise [name] is a 73 y.o. female past medical history of diabetes, hypertension, hyperlipidemia, colon cancer, COPD, chronic respiratory failure with hypoxia, systolic CHF with DOE admitted with sudden onset of left-sided weakness.  Found to have R cerebellar infarct s/p tPA w/ hemorrhagic transformation  Clinical Impression  Patient presents with decreased independence with mobility due to L UE coordination and strength deficits, decreased balance, decreased activity tolerance and will benefit from skilled PT in the acute setting to maximize mobility and safety.  Feel she will benefit from CIR level rehab stay prior to d/c home with spouse assist.     Follow Up Recommendations CIR    Equipment Recommendations  3in1 (PT)    Recommendations for Other Services Rehab consult     Precautions / Restrictions Precautions Precautions: Fall Precaution Comments: watch O2 sats      Mobility  Bed Mobility               General bed mobility comments: Pt up in chair  Transfers Overall transfer level: Needs assistance Equipment used: Rolling walker (2 wheeled) Transfers: Sit to/from Stand Sit to Stand: Min assist;Mod assist         General transfer comment: pt consistently wanting to push up from armrest with L hand even through it consistently falls off the first trial; lifting assist at times  Ambulation/Gait Ambulation/Gait assistance: Min assist Gait Distance (Feet): 15 Feet(x 2) Assistive device: Rolling walker (2 wheeled) Gait Pattern/deviations: Step-to pattern;Step-through pattern;Shuffle;Trunk flexed     General Gait Details: incontinent of urine on the way to the bathroom with RW min A for balance/safety; assist back to recliner on 4-6L portable O2 throughout.  SpO2 once back in chair 77%on 6L,  recovered to 100% back on 4L in 2 minutes.  Stairs            Wheelchair Mobility    Modified Rankin (Stroke Patients Only) Modified Rankin (Stroke Patients Only) Pre-Morbid Rankin Score: Slight disability Modified Rankin: Moderately severe disability     Balance Overall balance assessment: Needs assistance Sitting-balance support: Feet supported Sitting balance-Leahy Scale: Fair Sitting balance - Comments: seated on toilet with no UE support   Standing balance support: Bilateral upper extremity supported;Single extremity supported;During functional activity Standing balance-Leahy Scale: Poor Standing balance comment: min-steady A while pt performing hygiene with one hand on walker                             Pertinent Vitals/Pain Pain Assessment: No/denies pain    Home Living Family/patient expects to be discharged to:: Private residence Living Arrangements: Spouse/significant other Available Help at Discharge: Family;Available 24 hours/day Type of Home: House Home Access: Stairs to enter Entrance Stairs-Rails: Right Entrance Stairs-Number of Steps: 4 Home Layout: One level;Laundry or work area in [place]: Environmental consultant - 2 wheels Additional Comments: home O2    Prior Function Level of Independence: Independent         Comments: Doesn't use walker     Hand Dominance   Dominant Hand: Right    Extremity/Trunk Assessment   Upper Extremity Assessment Upper Extremity Assessment: LUE deficits/detail LUE Deficits / Details: decreased coordination with opposition, decreased grip and wrist strength compared to R, elbow and shoulder strength WFL LUE Sensation: WNL    Lower Extremity Assessment Lower Extremity Assessment: Generalized  weakness    Cervical / Trunk Assessment Cervical / Trunk Assessment: Kyphotic  Communication   Communication: No difficulties  Cognition Arousal/Alertness: Awake/alert Behavior During Therapy: WFL for  tasks assessed/performed Overall Cognitive Status: Impaired/Different from baseline Area of Impairment: Safety/judgement;Attention                   Current Attention Level: Sustained     Safety/Judgement: Decreased awareness of safety;Decreased awareness of deficits     General Comments: defers to spouse for questions regarding home set up, etc; she was dismissing me thinking we were finished prior to mobilizing, spouse may cover for some baseline deficits      General Comments      Exercises     Assessment/Plan    PT Assessment Patient needs continued PT services  PT Problem List Decreased strength;Decreased coordination;Decreased balance;Decreased mobility;Decreased activity tolerance;Decreased knowledge of use of DME;Decreased safety awareness       PT Treatment Interventions DME instruction;Gait training;Functional mobility training;Therapeutic exercise;Patient/family education;Balance training;Therapeutic activities    PT Goals (Current goals can be found in the Care Plan section)  Acute Rehab PT Goals Patient Stated Goal: to get stronger, go to rehab PT Goal Formulation: With patient/family Time For Goal Achievement: 11/20/18 Potential to Achieve Goals: Good    Frequency Min 4X/week   Barriers to discharge        Co-evaluation               AM-PAC PT "6 Clicks" Mobility  Outcome Measure Help needed turning from your back to your side while in a flat bed without using bedrails?: A Little Help needed moving from lying on your back to sitting on the side of a flat bed without using bedrails?: A Little Help needed moving to and from a bed to a chair (including a wheelchair)?: A Little Help needed standing up from a chair using your arms (e.g., wheelchair or bedside chair)?: A Little Help needed to walk in hospital room?: A Little Help needed climbing 3-5 steps with a railing? : A Lot 6 Click Score: 17    End of Session Equipment Utilized During  Treatment: Gait belt;Oxygen Activity Tolerance: Patient limited by fatigue;Treatment limited secondary to medical complications (Comment)(hypoxic on supplemental O2 with ambulation) Patient left: in chair;with call bell/phone within reach;with family/visitor present   PT Visit Diagnosis: Other abnormalities of gait and mobility (R26.89);Muscle weakness (generalized) (M62.81);Hemiplegia and hemiparesis Hemiplegia - Right/Left: Left Hemiplegia - dominant/non-dominant: Non-dominant Hemiplegia - caused by: Cerebral infarction;Unspecified    Time: 4332-9518 PT Time Calculation (min) (ACUTE ONLY): 33 min   Charges:   PT Evaluation $PT Eval Moderate Complexity: 1 Mod PT Treatments $Gait Training: 8-22 mins        Magda Kiel, [place] [facility] Services [phone] 11/06/2018   Denise [name] 11/06/2018, 5:33 PM

## 2018-11-06 NOTE — Progress Notes (Signed)
Lower extremity venous has been completed.   Preliminary results in CV Proc.   Abram Sander 11/06/2018 10:56 AM

## 2018-11-07 ENCOUNTER — Inpatient Hospital Stay (HOSPITAL_COMMUNITY): Payer: Medicare HMO

## 2018-11-07 DIAGNOSIS — R531 Weakness: Secondary | ICD-10-CM

## 2018-11-07 DIAGNOSIS — R29898 Other symptoms and signs involving the musculoskeletal system: Secondary | ICD-10-CM

## 2018-11-07 DIAGNOSIS — I6340 Cerebral infarction due to embolism of unspecified cerebral artery: Secondary | ICD-10-CM

## 2018-11-07 DIAGNOSIS — Z789 Other specified health status: Secondary | ICD-10-CM

## 2018-11-07 DIAGNOSIS — I639 Cerebral infarction, unspecified: Secondary | ICD-10-CM

## 2018-11-07 DIAGNOSIS — Z7409 Other reduced mobility: Secondary | ICD-10-CM

## 2018-11-07 DIAGNOSIS — R278 Other lack of coordination: Secondary | ICD-10-CM

## 2018-11-07 LAB — CBC
HCT: 37.7 % (ref 36.0–46.0)
Hemoglobin: 11.9 g/dL — ABNORMAL LOW (ref 12.0–15.0)
MCH: 26 pg (ref 26.0–34.0)
MCHC: 31.6 g/dL (ref 30.0–36.0)
MCV: 82.5 fL (ref 80.0–100.0)
Platelets: 219 10*3/uL (ref 150–400)
RBC: 4.57 MIL/uL (ref 3.87–5.11)
RDW: 14.8 % (ref 11.5–15.5)
WBC: 5.3 10*3/uL (ref 4.0–10.5)
nRBC: 0 % (ref 0.0–0.2)

## 2018-11-07 LAB — BASIC METABOLIC PANEL
Anion gap: 10 (ref 5–15)
BUN: 22 mg/dL (ref 8–23)
CO2: 33 mmol/L — ABNORMAL HIGH (ref 22–32)
Calcium: 10.7 mg/dL — ABNORMAL HIGH (ref 8.9–10.3)
Chloride: 99 mmol/L (ref 98–111)
Creatinine, Ser: 1.17 mg/dL — ABNORMAL HIGH (ref 0.44–1.00)
GFR calc Af Amer: 54 mL/min — ABNORMAL LOW (ref >60)
GFR calc non Af Amer: 46 mL/min — ABNORMAL LOW (ref >60)
Glucose, Bld: 128 mg/dL — ABNORMAL HIGH (ref 70–99)
Potassium: 3 mmol/L — ABNORMAL LOW (ref 3.5–5.1)
Sodium: 142 mmol/L (ref 135–145)

## 2018-11-07 LAB — GLUCOSE, CAPILLARY
Glucose-Capillary: 111 mg/dL — ABNORMAL HIGH (ref 70–99)
Glucose-Capillary: 181 mg/dL — ABNORMAL HIGH (ref 70–99)

## 2018-11-07 MED ORDER — FUROSEMIDE 20 MG PO TABS
20.0000 mg | ORAL_TABLET | Freq: Every day | ORAL | Status: DC
  Administered 2018-11-07 – 2018-11-08 (×2): 20 mg via ORAL
  Filled 2018-11-07 (×2): qty 1

## 2018-11-07 MED ORDER — ASPIRIN EC 325 MG PO TBEC
325.0000 mg | DELAYED_RELEASE_TABLET | Freq: Every day | ORAL | Status: DC
  Administered 2018-11-07 – 2018-11-08 (×2): 325 mg via ORAL
  Filled 2018-11-07 (×2): qty 1

## 2018-11-07 MED ORDER — POTASSIUM CHLORIDE CRYS ER 20 MEQ PO TBCR
40.0000 meq | EXTENDED_RELEASE_TABLET | ORAL | Status: AC
  Administered 2018-11-07 (×2): 40 meq via ORAL
  Filled 2018-11-07: qty 2

## 2018-11-07 NOTE — Progress Notes (Signed)
Physical Therapy Treatment Patient Details Name: Denise [name] MRN: [id number] DOB: [birth year] Today's Date: 11/07/2018    History of Present Illness Denise [name] is a 73 y.o. female past medical history of diabetes, hypertension, hyperlipidemia, colon cancer, COPD, chronic respiratory failure with hypoxia, systolic CHF with DOE admitted with sudden onset of left-sided weakness.  Found to have R cerebellar infarct s/p tPA w/ hemorrhagic transformation    PT Comments    Pt making progress towards physical therapy goals, ambulating half loop around unit with walker and min assist. Requires standing rest breaks (chair follow utilized, but not needed). Displays left sided weakness, decreased coordination, balance impairments, and decreased activity tolerance. Continue to recommend comprehensive inpatient rehab (CIR) for post-acute therapy needs.    Follow Up Recommendations  CIR     Equipment Recommendations  3in1 (PT)    Recommendations for Other Services       Precautions / Restrictions Precautions Precautions: Fall;Other (comment) Precaution Comments: watch O2 sats Restrictions Weight Bearing Restrictions: No    Mobility  Bed Mobility               General bed mobility comments: OOB in chair  Transfers Overall transfer level: Needs assistance Equipment used: Rolling walker (2 wheeled) Transfers: Sit to/from Stand Sit to Stand: Min assist         General transfer comment: Cues for hand placement, minA to rise from recliner and toilet.  Ambulation/Gait Ambulation/Gait assistance: Min assist Gait Distance (Feet): 75 Feet(75, 75) Assistive device: Rolling walker (2 wheeled);None Gait Pattern/deviations: Step-through pattern;Shuffle;Trunk flexed Gait velocity: decr Gait velocity interpretation: <1.8 ft/sec, indicate of risk for recurrent falls General Gait Details: MinA for stability with and without use of walker. Cues for direction, upward gaze.  Noted decreased LLE coordination.    Stairs             Wheelchair Mobility    Modified Rankin (Stroke Patients Only) Modified Rankin (Stroke Patients Only) Pre-Morbid Rankin Score: Slight disability Modified Rankin: Moderately severe disability     Balance Overall balance assessment: Needs assistance Sitting-balance support: Feet supported Sitting balance-Leahy Scale: Fair     Standing balance support: Bilateral upper extremity supported;Single extremity supported;During functional activity Standing balance-Leahy Scale: Poor Standing balance comment: reliant on external support                            Cognition Arousal/Alertness: Awake/alert Behavior During Therapy: WFL for tasks assessed/performed Overall Cognitive Status: Impaired/Different from baseline Area of Impairment: Safety/judgement;Attention;Memory                   Current Attention Level: Sustained Memory: Decreased recall of precautions;Decreased short-term memory   Safety/Judgement: Decreased awareness of safety;Decreased awareness of deficits     General Comments: aware her L side is weak but not the implications it has on function and safety.      Exercises      General Comments  session on 3L O2      Pertinent Vitals/Pain Pain Assessment: Faces Faces Pain Scale: No hurt    Home Living                      Prior Function            PT Goals (current goals can now be found in the care plan section) Acute Rehab PT Goals Patient Stated Goal: go to rehab Potential to Achieve Goals: Good Progress towards  PT goals: Progressing toward goals    Frequency    Min 4X/week      PT Plan Current plan remains appropriate    Co-evaluation              AM-PAC PT "6 Clicks" Mobility   Outcome Measure  Help needed turning from your back to your side while in a flat bed without using bedrails?: A Little Help needed moving from lying on your back to  sitting on the side of a flat bed without using bedrails?: A Little Help needed moving to and from a bed to a chair (including a wheelchair)?: A Little Help needed standing up from a chair using your arms (e.g., wheelchair or bedside chair)?: A Little Help needed to walk in hospital room?: A Little Help needed climbing 3-5 steps with a railing? : A Lot 6 Click Score: 17    End of Session Equipment Utilized During Treatment: Gait belt;Oxygen Activity Tolerance: Patient tolerated treatment well Patient left: in chair;with call bell/phone within reach;with family/visitor present Nurse Communication: Mobility status PT Visit Diagnosis: Other abnormalities of gait and mobility (R26.89);Muscle weakness (generalized) (M62.81);Hemiplegia and hemiparesis     Time: 1425-1446 PT Time Calculation (min) (ACUTE ONLY): 21 min  Charges:  $Gait Training: 8-22 mins                     Ellamae Sia, [place], DPT Acute Rehabilitation Services Pager [phone] Office [phone]    Willy Eddy 11/07/2018, 4:12 PM

## 2018-11-07 NOTE — Evaluation (Signed)
Occupational Therapy Evaluation Patient Details Name: Denise [name]. Giacomo MRN: [id number] DOB: 03-02-1945 Today's Date: 11/07/2018    History of Present Illness Denise [name] is a 73 y.o. female past medical history of diabetes, hypertension, hyperlipidemia, colon cancer, COPD, chronic respiratory failure with hypoxia, systolic CHF with DOE admitted with sudden onset of left-sided weakness.  Found to have R cerebellar infarct s/p tPA w/ hemorrhagic transformation   Clinical Impression   Pt admitted with above diagnoses, presenting with cognitive deficits, generalized weakness (L > R), and cardiopulmonary status limiting activity tolerance and overall engagement in BADL at desired level of independence. PTA pt living with spouse and independent. At time of eval, she is min guard for bed mobility and min -mod A for stand pivot transfers. Pt is able to rise to standing with min A, but requiring mod A to steady and L knee block 2/2 weakness. Pt uses home O2, and on 3L [place] at this time. With mobility pt saturating into high 80s, recovered quickly with rest and cues for breathing. Vision tested as described below, will need further assessment considering cognition is decreased. Pt also with cognitive deficits as described below, unsure of baseline with cognition as no family present. Given current status, recommend CIR at d/c to facilitate safe progression in BADL prior to d/c home. Will continue to follow per POC listed below.     Follow Up Recommendations  CIR;Supervision/Assistance - 24 hour    Equipment Recommendations  Other (comment)(TBD)    Recommendations for Other Services       Precautions / Restrictions Precautions Precautions: Fall Precaution Comments: watch O2 sats Restrictions Weight Bearing Restrictions: No      Mobility Bed Mobility Overal bed mobility: Needs Assistance Bed Mobility: Supine to Sit     Supine to sit: Min guard     General bed mobility comments:  min guard for safety, increased time and use of rails  Transfers Overall transfer level: Needs assistance Equipment used: 1 person hand held assist Transfers: Stand Pivot Transfers   Stand pivot transfers: Min assist;Mod assist       General transfer comment: min A to rise, mod A to steady over to recliner. L knee blocked due to weakness    Balance Overall balance assessment: Needs assistance Sitting-balance support: Feet supported Sitting balance-Leahy Scale: Fair     Standing balance support: Bilateral upper extremity supported;Single extremity supported;During functional activity Standing balance-Leahy Scale: Poor Standing balance comment: reliant on external support                           ADL either performed or assessed with clinical judgement   ADL Overall ADL's : Needs assistance/impaired Eating/Feeding: Set up;Sitting Eating/Feeding Details (indicate cue type and reason): set up to open packaging and arrange tray for successful feeding. Pt able to manage soft foods with R hand utensils Grooming: Set up;Sitting   Upper Body Bathing: Sitting;Moderate assistance   Lower Body Bathing: Sit to/from stand;Sitting/lateral leans;Maximal assistance   Upper Body Dressing : Moderate assistance;Sitting   Lower Body Dressing: Maximal assistance;Sit to/from stand;Sitting/lateral leans   Toilet Transfer: Minimal assistance;Moderate assistance;BSC;Stand-pivot Toilet Transfer Details (indicate cue type and reason): min A to rise, mod A for steadying with stand pivots- simulated with recliner Toileting- Clothing Manipulation and Hygiene: Moderate assistance;Sit to/from stand;Sitting/lateral lean       Functional mobility during ADLs: Minimal assistance;Moderate assistance General ADL Comments: pt limited by cognitive deficits, L sided weakness, and decreased  activity tolerance     Vision Baseline Vision/History: Wears glasses Wears Glasses: At all times(not  present at eval) Patient Visual Report: No change from baseline Vision Assessment?: Yes Eye Alignment: Within Functional Limits Ocular Range of Motion: Within Functional Limits Alignment/Gaze Preference: Within Defined Limits Tracking/Visual Pursuits: Able to track stimulus in all quads without difficulty;Other (comment)(slightly moving head, but suspect more cognitive with command following than visual involvement) Visual Fields: No apparent deficits Additional Comments: continue to assess for L neglect, difficult to assess given cognition     Perception     Praxis      Pertinent Vitals/Pain Pain Assessment: No/denies pain     Hand Dominance Right   Extremity/Trunk Assessment Upper Extremity Assessment Upper Extremity Assessment: LUE deficits/detail;RUE deficits/detail RUE Deficits / Details: generalized weakness LUE Deficits / Details: 2+/5 breaks with very little resistance. Decreased FMC overall LUE Coordination: decreased fine motor   Lower Extremity Assessment Lower Extremity Assessment: Defer to PT evaluation       Communication Communication Communication: No difficulties   Cognition Arousal/Alertness: Awake/alert Behavior During Therapy: WFL for tasks assessed/performed Overall Cognitive Status: Impaired/Different from baseline Area of Impairment: Safety/judgement;Attention;Orientation;Memory                 Orientation Level: Disoriented to;Situation Current Attention Level: Sustained Memory: Decreased recall of precautions;Decreased short-term memory(oriented to situation, asked to recall at end of session and was unable)   Safety/Judgement: Decreased awareness of safety;Decreased awareness of deficits     General Comments: difficulty with recall, aware her L side is weak but not the implications it has on function and safety. Decreased attention, requiring cues. Unsure if any of this is baseline, husband not present   General Comments        Exercises     Shoulder Instructions      Home Living Family/patient expects to be discharged to:: Private residence Living Arrangements: Spouse/significant other Available Help at Discharge: Family;Available 24 hours/day Type of Home: House Home Access: Stairs to enter [employer] of Steps: 4 Entrance Stairs-Rails: Right Home Layout: One level;Laundry or work area in basement     [employer] Shower/Tub: Teacher, early years/pre: Standard     Home Equipment: Environmental consultant - 2 wheels   Additional Comments: home O2; home info taken with PT collateral who spoke with husband. Pt able to recall most information      Prior Functioning/Environment Level of Independence: Independent        Comments: reports not using walker        OT Problem List: Decreased strength;Impaired vision/perception;Decreased knowledge of use of DME or AE;Decreased range of motion;Decreased coordination;Decreased knowledge of precautions;Decreased activity tolerance;Decreased cognition;Cardiopulmonary status limiting activity;Impaired UE functional use;Impaired balance (sitting and/or standing);Decreased safety awareness      OT Treatment/Interventions: Self-care/ADL training;Visual/perceptual remediation/compensation;Therapeutic exercise;Patient/family education;Neuromuscular education;Balance training;Energy conservation;Therapeutic activities;Cognitive remediation/compensation;DME and/or AE instruction    OT Goals(Current goals can be found in the care plan section) Acute Rehab OT Goals Patient Stated Goal: to get L side stronger OT Goal Formulation: With patient Time For Goal Achievement: 11/21/18 Potential to Achieve Goals: Good  OT Frequency: Min 2X/week   Barriers to D/C:            Co-evaluation              AM-PAC OT "6 Clicks" Daily Activity     Outcome Measure Help from another person eating meals?: A Little Help from another person taking care of personal  grooming?: A Little Help  from another person toileting, which includes using toliet, bedpan, or urinal?: A Lot Help from another person bathing (including washing, rinsing, drying)?: A Lot Help from another person to put on and taking off regular upper body clothing?: A Little Help from another person to put on and taking off regular lower body clothing?: A Lot 6 Click Score: 15   End of Session Equipment Utilized During Treatment: Gait belt;Oxygen Nurse Communication: Mobility status  Activity Tolerance: Patient tolerated treatment well Patient left: in chair;with call bell/phone within reach;with chair alarm set  OT Visit Diagnosis: Other abnormalities of gait and mobility (R26.89);Unsteadiness on feet (R26.81);Muscle weakness (generalized) (M62.81);Other symptoms and signs involving cognitive function;Hemiplegia and hemiparesis Hemiplegia - Right/Left: Left Hemiplegia - dominant/non-dominant: Non-Dominant Hemiplegia - caused by: Cerebral infarction                Time: 2951-8841 OT Time Calculation (min): 28 min Charges:  OT General Charges $OT Visit: 1 Visit OT Evaluation $OT Eval Moderate Complexity: 1 Mod OT Treatments $Self Care/Home Management : 8-22 mins  Zenovia Jarred, MSOT, OTR/L Behavioral Health OT/ Acute Relief OT [facility] Office: [phone]  Zenovia Jarred 11/07/2018, 10:19 AM

## 2018-11-07 NOTE — Care Management Important Message (Signed)
Important Message  Patient Details  Name: Denise [name]. Denise [name] MRN: [id number] Date of Birth: 28-May-1945   Medicare Important Message Given:  Yes     Denise [name] Denise [name] 11/07/2018, 2:29 PM

## 2018-11-07 NOTE — Progress Notes (Signed)
STROKE TEAM PROGRESS NOTE   INTERVAL HISTORY Pt sitting in chair, husband at bedside. Pt awake alert and neuro stable. PT/OT recommend CIR. Her CT repeat in am showed stable HT right cerebellar, but new infarct at left cerebellar, but seems aysmptomatic.    Vitals:   11/07/18 0400 11/07/18 0545 11/07/18 0758 11/07/18 0946  BP: 116/75 107/64  123/68  Pulse: 62 62  67  Resp: (!) 22 (!) 22  18  Temp: 98.3 F (36.8 C) 98 F (36.7 C)  (!) 97.5 F (36.4 C)  TempSrc: Oral Oral  Oral  SpO2: 98% 96% 100% 94%  Weight:      Height:        CBC:  Recent Labs  Lab 11/04/18 1500  11/06/18 0554 11/07/18 0504  WBC 5.3   < > 5.0 5.3  NEUTROABS 3.3  --   --   --   HGB 15.5*   < > 11.8* 11.9*  HCT 52.3*   < > 37.3 37.7  MCV 87.9   < > 82.7 82.5  PLT 276   < > 231 219   < > = values in this interval not displayed.    Basic Metabolic Panel:  Recent Labs  Lab 11/01/18 0417 11/02/18 0443  11/06/18 0554 11/07/18 0504  NA 141 142   < > 142 142  K 3.7 3.6   < > 3.5 3.0*  CL 94* 95*   < > 99 99  CO2 32 34*   < > 35* 33*  GLUCOSE 150* 113*   < > 128* 128*  BUN 31* 35*   < > 23 22  CREATININE 1.18* 1.12*   < > 0.99 1.17*  CALCIUM 11.2* 11.2*   < > 10.5* 10.7*  MG 2.0 1.9  --   --   --    < > = values in this interval not displayed.   Lipid Panel:     Component Value Date/Time   CHOL 170 11/05/2018 0602   TRIG 85 11/05/2018 0602   HDL 62 11/05/2018 0602   CHOLHDL 2.7 11/05/2018 0602   VLDL 17 11/05/2018 0602   LDLCALC 91 11/05/2018 0602   HgbA1c:  Lab Results  Component Value Date   HGBA1C 7.0 (H) 11/05/2018   Urine Drug Screen: No results found for: LABOPIA, COCAINSCRNUR, LABBENZ, AMPHETMU, THCU, LABBARB  Alcohol Level No results found for: ETH  IMAGING Ct Head Wo Contrast 11/07/2018 1. New small to moderate infarct in the lateral left cerebellum. 2. Unchanged appearance of hemorrhagic infarct with swelling in the inferior right cerebellum. No hydrocephalus. 3. Unchanged  appearance of nonhemorrhagic small cortical infarcts along the right cerebral convexity.    Mr Brain Wo Contrast 11/05/2018 1738 1. Large acute/subacute hemorrhagic right cerebellar infarct not significantly changed from head CT earlier the same day. Posterior fossa mass effect is also similar with partial effacement of the fourth ventricle. No hydrocephalus 2. Multiple additional small acute/subacute cortical infarcts within the right MCA vascular territory, some of which demonstrate subtle petechial hemorrhage. 3. Multiple small chronic hemorrhagic and nonhemorrhagic cortical infarcts within the bilateral cerebral hemispheres. 4. Small chronic lacunar infarcts within the right thalamus and left cerebellum. Background of mild chronic small vessel ischemic disease.   Mr Angio Head Wo Contrast 11/05/2018 1. No intracranial large vessel occlusion. 2. Intracranial atherosclerotic disease. Most notably, there are sites of up to moderate stenosis within the non dominant left left vertebral artery. Additional mild stenoses as described.   Ct Head Wo  Contrast 11/05/2018 0311 1. No significant interval change in size of hemorrhagic right cerebellar infarct with similar mass regional mass effect. No hydrocephalus or ventricular trapping. 2. No other new acute intracranial abnormality.   Ct Head Wo Contrast 11/04/2018 1953 1. Acute to subacute right cerebellar infarct with interval hemorrhagic transformation. Dominant intraparenchymal hemorrhage measures approximately 3.2 x 3.0 x 1.6 cm (estimated volume 8 CC). Associated regional mass effect throughout the right posterior fossa without obstructive hydrocephalus. 2. No other new acute intracranial abnormality.    Ct Head Code Stroke Wo Contrast 11/04/2018 1435 1. No acute intracranial hemorrhage. Suspected age-indeterminate right cerebellar infarction. 2. ASPECTS is 10 3. Chronic microvascular ischemic changes. Small chronic left frontal infarct.   Vas US  Carotid 11/05/2018 Right Carotid: Velocities in the right ICA are consistent with a 1-39% stenosis.  Left Carotid: Velocities in the left ICA are consistent with a 1-39% stenosis. Vertebrals:  Bilateral vertebral arteries demonstrate antegrade flow. Subclavians: Normal flow hemodynamics were seen in bilateral subclavian arteries.   2D Echocardiogram  1. Left ventricular ejection fraction, by visual estimation, is 60 to 65%. The left ventricle has normal function. There is severely increased left ventricular hypertrophy.  2. Left ventricular diastolic parameters are consistent with Grade I diastolic dysfunction (impaired relaxation).  3. Global right ventricle has moderately reduced systolic function.The right ventricular size is normal.  4. Left atrial size was mildly dilated.  5. Right atrial size was normal.  6. Trivial pericardial effusion is present.  7. The mitral valve is abnormal. Trace mitral valve regurgitation. No evidence of mitral stenosis.  8. The tricuspid valve is normal in structure. Tricuspid valve regurgitation is mild.  9. The aortic valve is tricuspid. Aortic valve regurgitation is trivial. Mild aortic valve sclerosis without stenosis. 10. The pulmonic valve was normal in structure. Pulmonic valve regurgitation is not visualized. 11. Moderately elevated pulmonary artery systolic pressure. 12. The inferior vena cava is normal in size with greater than 50% respiratory variability, suggesting right atrial pressure of 3 mmHg. 13. Normal LV systolic function; grade 1 diastolic dysfunction; severe LVH with speckled appearance; consider amyloid; mild LAE; moderate RV dysfunction; trace AI.  PHYSICAL EXAM   Temp:  [97.5 F (36.4 C)-98.3 F (36.8 C)] 97.5 F (36.4 C) (11/05 0946) Pulse Rate:  [59-74] 67 (11/05 0946) Resp:  [0-32] 18 (11/05 0946) BP: (101-123)/(59-87) 123/68 (11/05 0946) SpO2:  [94 %-100 %] 94 % (11/05 0946)  General - Well nourished, well developed, in no  apparent distress.  Ophthalmologic - fundi not visualized due to noncooperation.  Cardiovascular - Regular rhythm and rate.  Mental Status -  Level of arousal and orientation to time, place, and person were intact. Language including expression, naming, repetition, comprehension was assessed and found intact. Mild dysarthria  Cranial Nerves II - XII - II - Visual field intact OU. III, IV, VI - Extraocular movements intact. V - Facial sensation intact bilaterally. VII - left mild facial droop. VIII - Hearing & vestibular intact bilaterally. X - Palate elevates symmetrically, mild dysarthria. XI - Chin turning & shoulder shrug intact bilaterally. XII - Tongue protrusion intact.  Motor Strength - The patient's strength was normal in all extremities except left bicep 4/5, left wrist extension 4-/5 and left hand grip 3+/5 and pronator drift was absent.  Bulk was normal and fasciculations were absent.   Motor Tone - Muscle tone was assessed at the neck and appendages and was normal.  Reflexes - The patient's reflexes were symmetrical in all extremities  and she had no pathological reflexes.  Sensory - Light touch, temperature/pinprick were assessed and were symmetrical.    Coordination - The patient had normal movements in the right hand and left leg with no ataxia or dysmetria.  Tremor was absent.  Gait and Station - deferred.   ASSESSMENT/PLAN [name] is a 73 y.o. female with history of diabetes, hypertension, hyperlipidemia, colon cancer, COPD, chronic respiratory failure with hypoxia, systolic CHF with DOE presenting with slurred speech and left sided weakness. Recently hospitalized for acute on chronic respiratory failure w/ hypoxia and hypercapnia, likely COPD exacerbation and diastolic HF. Has ongoing SOB.   1. Stroke: new interval left cerebellar infarct, could be due to high grade tandem stenosis left VA in the setting of relative low BP  2. Stroke:   R  cerebellar and R MCA infarcts s/p tPA w/ R cerebellar hemorrhagic transformation with new L cerebellar infarct in hospital, infarcts likely embolic secondary to unknown source  Code Stroke CT head No acute abnormality. Old L frontal lobe infarct. Small vessel disease.   CT head R cerebellar infarct w/ interval hemorrhagic transformation 8cc w/ mass effect.  CT head repeat no significant change   MRI  Large R cerebellar infarct w/ posterior mass effect. Small cortical infarcts R MCA some with petechial hemorrhage. Multiple old small hemorrhagic and non-hemorrhagic B cortical infarcts. Old R thalamic and L cerebellar lacunes.   MRA  No LVO. Intracranial atherosclerosis (most significant is moderate L VA tandem stenosis)  Repeat CT 11/5 new small to moderate lateral L cerebellar infarct. Unchanged R cerebellar infarct w/ hemorrhagic transformation. Unchanged small R cerebral convexity cortical infarcts  Carotid Doppler  B ICA 1-39% stenosis, VAs antegrade   2D Echo EF 60-65%. No source of embolus. Consider amyloidosis   LE venous doppler negative for DVT  loop recorder to rule out afib - cardiology consulted and will plan placement 11/6 prior to d/c  LDL 91  HgbA1c 7.0  SCDs for VTE prophylaxis  aspirin 81 mg daily prior to admission, start aspirin 325 mg daily.   Therapy recommendations:  CIR  Disposition:  pending   Anticipate medical readiness for CIR transfer 11/6  Post tPA hemorrhagic conversion  HA with elevated BP, BP up to > 200 after tPA  [facility] w/ hemorrhagic transformation site of infarct   Fibrinogen 160  Received TXA  Repeat CT stable hematoma  MRI and MRA stable hemorrhage. No LVO. See above  CT repeat with stable hemorrhage   Put on ASA 325  Hypertensive Urgency  On Cleviprex  Home meds:  isosorbide-HCTZ 20-37.5, lasix 40, losartan 50, metoprolol XR 25, spironolactone 25 BP goal < 160 given stable hemorrhagic transformation  . Home meds resumed  except for losartan and metoprolol, lasix decreased to 20 . BP stable now . Long-term BP goal normotensive . Avoid low BP give left VA high grade stenosis  Hyperlipidemia  Home meds:  lipitor 10  LDL 91, goal < 70  Increase lipitor to 20. Will not do higher intensity state d/t advanced age and LDL almost at goal  Continue statin at discharge  Diabetes type II Controlled  Home meds:  None listed  HgbA1c 7.0, goal < 7.0  CBGs  SSI  Close PCP follow up  Other Stroke Risk Factors  Advanced age  Former Cigarette smoker, quit 31 yrs ago  Diastolic Congestive heart failure  Other Active Problems  Chronic respiratory failure w/ hypoxia  COPD - remains on O2 at 4L -  wean as able  Anxiety and depression  Hx colon cancer  Primary hyperparathyroidism   CKD stage III, Cre 1.12->1.36->1.21->0.99->1.17  Severe malnutrition d/t chronic illness, Body mass index is 15.98 kg/m.   Hospital day # 3  I spent  35 minutes in total face-to-face time with the patient, more than 50% of which was spent in counseling and coordination of care, reviewing test results, images and medication, and discussing the diagnosis of stroke, hemorrhagic conversion, CHF, recurrent stroke, treatment plan and potential prognosis. This patient's care requiresreview of multiple databases, neurological assessment, discussion with family, other specialists and medical decision making of high complexity. I had long discussion with pt and husband at bedside as well as sister Denise [name] over the phone, updated pt current condition, treatment plan and potential prognosis, and answered all the questions. They expressed understanding and appreciation.   Rosalin Hawking, MD PhD Stroke Neurology 11/07/2018 10:59 AM    To contact Stroke Continuity provider, please refer to [email or web address] After hours, contact General Neurology

## 2018-11-07 NOTE — PMR Pre-admission (Addendum)
PMR Admission Coordinator Pre-Admission Assessment  Patient: Denise [name]. Agan is an 73 y.o., female MRN: [id number] DOB: 1945-08-06 Height: _0  (167.6 cm) Weight: 44.9 kg              Insurance Information HMO: yes    PPO:      PCP:     IPA:      80/20:      OTHER: medicare advantage PRIMARY: [employer]      Policy#: [id number]      Subscriber: pt CM Name:  [name]     Phone#: [phone] ext [phone]     Fax#: [phone] Pre-Cert#: [id number]  Approved for 7 days with f/u with [facility] ext [phone] fax [phone]    Employer:  Benefits:  Phone #: [phone]     Name: 11/5 Eff. Date: 02/02/2018     Deduct: none      Out of Pocket Max: $3400      Life Max: none CIR: $295 co pay per day days 1 until 6      SNF: no copay days 1 until 20; $178 co pay per day days 21 until 100 Outpatient: $10 to $40 co py per visit     Co-Pay: visits per medical neccesity Home Health: 100%      Co-Pay: visits per medical neceesity DME: 805     Co-Pay: 205 Providers: in network  SECONDARY: none      Medicaid Application Date:       Case Manager:  Disability Application Date:       Case Worker:   The "Data Collection Information Summary" for patients in Inpatient Rehabilitation Facilities with attached "Privacy Act [place] Records" was provided and verbally reviewed with: Patient and Family  Emergency Contact Information Contact Information    Name Relation Home Work Mobile   [place] Sister [phone]  [phone]   Hilyard,[name] Spouse [phone]       Current Medical History  Patient Admitting Diagnosis: right cerebellar CVA   History of Present Illness: 73 year old female with past history of diabetes, HTN, hyperlipidemia, colon cancer, COPD,pulmonary HTN, chronic respiratory failure with hypoxia, hyperparathyroidism and systolic CHF with DOE. Recent admission to hospital for respiratory failure.Presented on 11/04/2018 with sudden onset of left sided facial  weakness and left arm weakness. EMS activated.  Patient treated with TPA.   CT head revealed right cerebellar infarct s/p TPA with hemorrhagic transformation 8 cc with mass effect. Patient given fibrinogen, and TXA for TPA reversal. Infarct likely embolic secondary to unknown source. MRI showed right cerebellar HT with right MCA small scattered infarcts. MRA unremarkable. Carotid doppler B ICA 1-39% stenosis, VAs antegrade. 2 d echo EF 60 to 65 %. No source embolus. Consider amyloidosis. LE venous dopplers negative for DVT. Will plan LOOP recorder placement 11/6 to rule out Afib. SCDs for VTE prophylaxis. ASA 81 mg daily prior to admit. Now on no antithrombotic due to HT. Repeat CT 11/5 showed stable HT right cerebellar CVA, but new infarct at left cerebellar.To satrt ada 325 mg daily.  HTN urgency treated with cleviprex initially. Home meds of Isosorbide HCTZ, lasix, losartan, metoprolol and spironolactone. BP goals <140 given hemorrhagic transformation.  Avoid low BP given left VA high grade stenosis. Home meds resumed except for losartan.Lipitor increased to 20 with LDL 91. To continue statin at discharge. Hgb A1c 7 with no meds pta. CBGS and SSI with close PCP follow up as outpatient. Severe malnutrition due to chronic illness.   Old left frontal  lobe infarct noted.  Complete NIHSS TOTAL: 2 Glasgow Coma Scale Score: 15  Past Medical History  Past Medical History:  Diagnosis Date  . Altered mental status   . Anxiety and depression   . Chronic respiratory failure with hypoxia ([place]) 10/02/2017   Assoc with ? Cor pulmonale dx 09/2017 so rx 02 1-2 lpm 24/7  - 10/17/2017  Walked RA x 3 laps @ 185 ft each stopped due to  End of study, nl pace,  desat to 87 on 3rd lap - 12/19/2017  Saturations on Room Air at Rest =91 % and while Ambulating = 87%  But  on  2 Liters of pulsed oxygen while Ambulating =93% so rec POC 2lpm walking / use at rest if sats under 90%      . Colon cancer ([place]) 1988   Resected  .  COPD (chronic obstructive pulmonary disease) ([place])   . Diabetes mellitus without complication ([name])    diet controlled- no meds. per pt  . Diarrhea 04/02/2018  . Diverticulosis   . Hyperlipidemia   . Hypertension   . Hypoxia 10/30/2018  . Insomnia   . Primary hyperparathyroidism ([place])   . SBO (small bowel obstruction) ([place])   . Stroke ([place]) 10/2018   tpa administered  . Tubular adenoma of rectum 02/23/2013   low grade  . Vitamin D deficiency     Family History  family history includes Ovarian cancer in her mother.  Prior Rehab/Hospitalizations:  Has the patient had prior rehab or hospitalizations prior to admission? Yes  Has the patient had major surgery during 100 days prior to admission? No  Current Medications   Current Facility-Administered Medications:  .   stroke: mapping our early stages of recovery book, , Does not apply, Once, Amie Portland, MD .  0.9 %  sodium chloride infusion, , Intravenous, Continuous, Rosalin Hawking, MD, Stopped at 11/08/18 [phone] .  acetaminophen (TYLENOL) tablet 650 mg, 650 mg, Oral, Q4H PRN, 650 mg at 11/07/18 2211 **OR** acetaminophen (TYLENOL) 160 MG/5ML solution 650 mg, 650 mg, Per Tube, Q4H PRN **OR** acetaminophen (TYLENOL) suppository 650 mg, 650 mg, Rectal, Q4H PRN, Amie Portland, MD .  albuterol (VENTOLIN HFA) 108 (90 Base) MCG/ACT inhaler 1-2 puff, 1-2 puff, Inhalation, Q4H PRN, Amie Portland, MD .  aspirin EC tablet 325 mg, 325 mg, Oral, Daily, Rosalin Hawking, MD, 325 mg at 11/08/18 2800 .  atorvastatin (LIPITOR) tablet 20 mg, 20 mg, Oral, QHS, Rosalin Hawking, MD, 20 mg at 11/07/18 2211 .  Chlorhexidine Gluconate Cloth 2 % PADS 6 each, 6 each, Topical, Daily, Rosalin Hawking, MD, 6 each at 11/08/18 [phone] .  labetalol (NORMODYNE) injection 20 mg, 20 mg, Intravenous, Once **AND** clevidipine (CLEVIPREX) infusion 0.5 mg/mL, 0-21 mg/hr, Intravenous, Continuous, Amie Portland, MD, Stopped at 11/08/18 0430 .  famotidine (PEPCID) tablet 20 mg, 20 mg, Oral,  Daily, Rosalin Hawking, MD, 20 mg at 11/08/18 0834 .  fluticasone furoate-vilanterol (BREO ELLIPTA) 100-25 MCG/INH 1 puff, 1 puff, Inhalation, Daily, 1 puff at 11/08/18 0732 **AND** umeclidinium bromide (INCRUSE ELLIPTA) 62.5 MCG/INH 1 puff, 1 puff, Inhalation, Daily, Amie Portland, MD, 1 puff at 11/08/18 0732 .  [START ON 11/09/2018] furosemide (LASIX) tablet 40 mg, 40 mg, Oral, Daily, Rosalin Hawking, MD .  hydrALAZINE (APRESOLINE) injection 5-10 mg, 5-10 mg, Intravenous, Q4H PRN, Rosalin Hawking, MD .  insulin aspart (novoLOG) injection 0-15 Units, 0-15 Units, Subcutaneous, TID WC, Rosalin Hawking, MD, 3 Units at 11/08/18 1429 .  insulin aspart (novoLOG) injection 0-5 Units, 0-5 Units,  Subcutaneous, QHS, Rosalin Hawking, MD .  MEDLINE mouth rinse, 15 mL, Mouth Rinse, BID, Amie Portland, MD, 15 mL at 11/08/18 0834 .  mirtazapine (REMERON) tablet 15 mg, 15 mg, Oral, QHS, Rosalin Hawking, MD, 15 mg at 11/07/18 2211 .  senna-docusate (Senokot-S) tablet 1 tablet, 1 tablet, Oral, QHS PRN, Amie Portland, MD .  sodium chloride flush (NS) 0.9 % injection 3 mL, 3 mL, Intravenous, Once, Pfeiffer, Marcy, MD .  spironolactone (ALDACTONE) tablet 25 mg, 25 mg, Oral, QPM, Rosalin Hawking, MD, 25 mg at 11/07/18 2215  Patients Current Diet:  Diet Order            Diet heart healthy/carb modified Room service appropriate? Yes; Fluid consistency: Thin  Diet effective now              Precautions / Restrictions Precautions Precautions: Fall, Other (comment) Precaution Comments: watch O2 sats Restrictions Weight Bearing Restrictions: No   Has the patient had 2 or more falls or a fall with injury in the past year?No  Prior Activity Level Limited Community (1-2x/wk): Mod I without AD; does not drive  Prior Functional Level Prior Function Level of Independence: Independent Comments: reports not using walker  Self Care: Did the patient need help bathing, dressing, using the toilet or eating?  Independent  Indoor Mobility: Did  the patient need assistance with walking from room to room (with or without device)? Independent  Stairs: Did the patient need assistance with internal or external stairs (with or without device)? Needed some help  Functional Cognition: Did the patient need help planning regular tasks such as shopping or remembering to take medications? Needed some help  Home Assistive Devices / [place] Devices/Equipment: Oxygen Home Equipment: Walker - 2 wheels  Prior Device Use: Indicate devices/aids used by the patient prior to current illness, exacerbation or injury? no AD  Current Functional Level Cognition  Arousal/Alertness: Awake/alert Overall Cognitive Status: Impaired/Different from baseline Current Attention Level: Sustained Orientation Level: Oriented to person, Oriented to place, Disoriented to time, Disoriented to situation Safety/Judgement: Decreased awareness of safety, Decreased awareness of deficits General Comments: Pt with difficulty communicating needs. Perseverating on finding her "pink in the closet," while pointing to the computer. PT deduced she was looking for her denture case but was unable to find it; pt not comprehending that case was not in room.  Attention: Selective Selective Attention: Appears intact Memory: Impaired Memory Impairment: Other (comment)(benefits from repetition of information) Awareness: Impaired Awareness Impairment: (intermittent recall of immediate plans)    Extremity Assessment (includes Sensation/Coordination)  Upper Extremity Assessment: LUE deficits/detail, RUE deficits/detail RUE Deficits / Details: generalized weakness LUE Deficits / Details: 2+/5 breaks with very little resistance. Decreased FMC overall LUE Sensation: WNL LUE Coordination: decreased fine motor  Lower Extremity Assessment: Defer to PT evaluation    ADLs  Overall ADL's : Needs assistance/impaired Eating/Feeding: Set up, Sitting Eating/Feeding Details  (indicate cue type and reason): set up to open packaging and arrange tray for successful feeding. Pt able to manage soft foods with R hand utensils Grooming: Set up, Sitting Upper Body Bathing: Sitting, Moderate assistance Lower Body Bathing: Sit to/from stand, Sitting/lateral leans, Maximal assistance Upper Body Dressing : Moderate assistance, Sitting Lower Body Dressing: Maximal assistance, Sit to/from stand, Sitting/lateral leans Toilet Transfer: Minimal assistance, Moderate assistance, BSC, Stand-pivot Toilet Transfer Details (indicate cue type and reason): min A to rise, mod A for steadying with stand pivots- simulated with recliner Toileting- Clothing Manipulation and Hygiene: Moderate assistance, Sit to/from stand,  Sitting/lateral lean Functional mobility during ADLs: Minimal assistance, Moderate assistance General ADL Comments: pt limited by cognitive deficits, L sided weakness, and decreased activity tolerance    Mobility  Overal bed mobility: Needs Assistance Bed Mobility: Supine to Sit Supine to sit: Min guard General bed mobility comments: OOB in chair    Transfers  Overall transfer level: Needs assistance Equipment used: Rolling walker (2 wheeled) Transfers: Sit to/from Stand Sit to Stand: Min assist Stand pivot transfers: Min assist, Mod assist General transfer comment: MinA to rise and stabilize from recliner    Ambulation / Gait / Stairs / Wheelchair Mobility  Ambulation/Gait Ambulation/Gait assistance: Mod assist Gait Distance (Feet): 75 Feet(75',75') Assistive device: None Gait Pattern/deviations: Step-through pattern, Shuffle, Trunk flexed, Narrow base of support General Gait Details: ModA for stability without assistive device; pt reaching consistently for external support in hallway. Needs slower pace and increased assist for balance activities i.e. head turns, stepping over obstacles, stops/starts Gait velocity: decr Gait velocity interpretation: <1.8 ft/sec,  indicate of risk for recurrent falls    Posture / Balance Dynamic Sitting Balance Sitting balance - Comments: seated on toilet with no UE support Balance Overall balance assessment: Needs assistance Sitting-balance support: Feet supported Sitting balance-Leahy Scale: Fair Sitting balance - Comments: seated on toilet with no UE support Standing balance support: Bilateral upper extremity supported, Single extremity supported, During functional activity Standing balance-Leahy Scale: Poor Standing balance comment: reliant on external support    Special needs/care consideration BiPAP/CPAP n/a CPM n/a Continuous Drip IV n/a Dialysis n/a Life Vest n/a Oxygen) 2 4 liters at baseline Special Bed n/a Trach Size n/a Wound Vac n/a Skin intact Bowel mgmt: continent lbm 11/5 Bladder mgmt: incontinent urgency Diabetic mgmt hgb A1c 7 Behavioral consideration n/a Chemo/radiation n/a Designated visitor is spouse, [name] LOOP placement 11/6   Previous Home Environment  Living Arrangements: Spouse/significant other  Lives With: Spouse Available Help at Discharge: Family, Available 24 hours/day Type of Home: House Home Layout: One level, Laundry or work area in basement Home Access: Stairs to enter Entrance Stairs-Rails: Building surveyor of Steps: 4 Bathroom Shower/Tub: Public librarian, Architectural technologist: Standard Bathroom Accessibility: Yes How Accessible: Accessible via walker Home Care Services: No Additional Comments: home O2; home info taken with PT collateral who spoke with husband. Pt able to recall most information  Discharge Living Setting Plans for Discharge Living Setting: Patient's home, Lives with (comment)(spouse) Type of Home at Discharge: House Discharge Home Layout: One level Discharge Home Access: Stairs to enter Entrance Stairs-Rails: Right Entrance Stairs-Number of Steps: 4 Discharge Bathroom Shower/Tub: Tub/shower unit, Curtain Discharge  Bathroom Toilet: Standard Discharge Bathroom Accessibility: Yes How Accessible: Accessible via walker Does the patient have any problems obtaining your medications?: No  Social/Family/Support Systems Patient Roles: Spouse Contact Information: spouse, Chalmers Cater Anticipated Caregiver: spouse Anticipated Caregiver's Contact Information: [phone] Ability/Limitations of Caregiver: no limitations Caregiver Availability: 24/7 Discharge Plan Discussed with Primary Caregiver: Yes Is Caregiver In Agreement with Plan?: Yes Does Caregiver/Family have Issues with Lodging/Transportation while Pt is in Rehab?: No  Goals/Additional Needs Patient/Family Goal for Rehab: Mod I to supervision wiht PT, OT, and SLP Expected length of stay: ELOS 7 to 10 days Pt/Family Agrees to Admission and willing to participate: Yes Program Orientation Provided & Reviewed with Pt/Caregiver Including Roles  & Responsibilities: Yes  Decrease burden of Care through IP rehab admission: n/a  Possible need for SNF placement upon discharge: not anticapted   Patient Condition: This patient's condition remains as documented in the consult  dated 11/5, in which the Rehabilitation Physician determined and documented that the patient's condition is appropriate for intensive rehabilitative care in an inpatient rehabilitation facility. Will admit to inpatient rehab today.  Assessment/Plan: Diagnosis: Acute ischemic stroke 1. Does the need for close, 24 hr/day Medical supervision in concert with the patient's rehab needs make it unreasonable for this patient to be served in a less intensive setting?Yes 2. Co-Morbidities requiring supervision/potential complications:diabetes, HTN, HLD, colon cancer, COPD, chronic respiratory failure with hypoxia, systolic CHF with DOE 3. Due tobladder management, bowel management, safety, skin/wound care, disease management, medication administration, pain management and patient education, does the  patient require 24 hr/day rehab nursing?Yes 4. Does the patient require coordinated care of a physician, rehab nurse, PT, OT, and SLP to address physical and functional deficits in the context of the above medical diagnosis(es)?Yes Addressing deficits in the following areas:balance, endurance, locomotion, strength, transferring, bowel/bladder control, bathing, dressing, feeding, grooming, toileting, cognition, speech, language, swallowing and psychosocial support 5. Can the patient actively participate in an intensive therapy program of at least 3 hrs of therapy 5 days a week?Yes 6. The potential for patient to make measurable gains while on inpatient rehab isgood. 7. Anticipated functional outcomes upon discharge from inpatient rehab:MinAPT,modified independentOT,independentSLP 8. Estimated rehab length of stay to reach the above functional goals is:2 weeks 9. Anticipated discharge destination:Home 10.Overall Rehab/Functional Prognosis:good  Preadmission Screen Completed By:  Cleatrice Burke, RN, 11/08/2018 4:28 PM ______________________________________________________________________   Discussed status with Dr. Ranell Patrick on 11/08/2018 at  63 and received approval for admission today.  Admission Coordinator:  Cleatrice Burke, time 4360 Date 11/08/2018

## 2018-11-07 NOTE — Progress Notes (Addendum)
Patient arrived in the unit at 0525 am in a wheelchair escorted by RN transferred from [place] ICU, alert and oriented, denies of pain, O2 [place] 4L, continues on pulse ox monitor and tele monitor, resting in a bed at this moment, and will continue to monitor.

## 2018-11-07 NOTE — Consult Note (Signed)
   [facility] CM Inpatient Consult   11/07/2018  Enid Derry A. Vanbergen 09-26-1945 [id number]    Patientreviewed for less than 7 days readmission and for potential [facility] care management services needed as a  benefit fromher [facility] plan, with 20%risk scorefor unplanned readmission and hospitalization.   Chart review andMD notes showas follows: Ms. Lucero A. Lapre is a 73 y.o. female with history of diabetes, hypertension, hyperlipidemia, colon cancer,COPD, chronic respiratory failure with hypoxia, systolic CHF with DOE. Recently hospitalized for acute on chronic respiratory failure w/ hypoxia and hypercapnia, likely COPD exacerbation and diastolic HF,     presenting with slurred speech and left sided weakness. MRI show Large R cerebellar infarct w/ posterior mass effect; infarcts likely embolic secondary to unknown source.  Patient's primary care provider isDr.Catherine Clark with [place] [facility] to provide transition of care follow-up. Patient is being followed at the Heart Failure clinic.  Brief chartreviewrevealsPT/ OTnotesrecommending patientfor CIR([facility]).  Will followforprogress anddisposition, and ifthere are any changesin dispositionand needs for appropriate community follow-up,please referpatientto [facility] caremanagement.  Of note, [facility] Care Management services does not replace or interfere with any servicesarranged by transition of care CM or social work.   Forquestions and additional information, pleasecontact:  Kassandra Meriweather A. Arneisha Kincannon, BSN, RN-BC [facility] Liaison Cell: [phone]

## 2018-11-07 NOTE — Consult Note (Signed)
Physical Medicine and Rehabilitation Consult Reason for Consult: Acute ischemic stroke Referring Physician: Dr. Rosalin Hawking   HPI: Denise [name] is a 73 y.o. female w/ diabetes, HTN, HLD, colon cancer, COPD, chronic respiratory failure with hypoxia, and systolic CHF with dyspnea on exertion, who was admitted with sudden onset of left-sided weakness. 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 Rehabilitation medicine was consulted regarding patient's decreased independence and mobility due to L UE impaired coordination and strength deficits, as well as impaired balance.   She and her husband would like inpatient rehabilitation to help Denise [name] gain strength and improve her balance before she returns home. She denies any problems with sleep, pain, or constipation. She says her goal is to be able to pick things up better. She was able to walk with therapy today. Her husband states that he and his wife are retired and that he will be home with her upon discharge.    ROS Past Medical History:  Diagnosis Date  . Altered mental status   . Anxiety and depression   . Chronic respiratory failure with hypoxia ([place]) 10/02/2017   Assoc with ? Cor pulmonale dx 09/2017 so rx 02 1-2 lpm 24/7  - 10/17/2017  Walked RA x 3 laps @ 185 ft each stopped due to  End of study, nl pace,  desat to 87 on 3rd lap - 12/19/2017  Saturations on Room Air at Rest =91 % and while Ambulating = 87%  But  on  2 Liters of pulsed oxygen while Ambulating =93% so rec POC 2lpm walking / use at rest if sats under 90%      . Colon cancer ([place]) 1988   Resected  . COPD (chronic obstructive pulmonary disease) ([place])   . Diabetes mellitus without complication ([name])    diet controlled- no meds. per pt  . Diarrhea 04/02/2018  . Diverticulosis   . Hyperlipidemia   . Hypertension   . Hypoxia 10/30/2018  . Insomnia   . Primary hyperparathyroidism ([place])   . SBO  (small bowel obstruction) ([place])   . Stroke ([place]) 10/2018   tpa administered  . Tubular adenoma of rectum 02/23/2013   low grade  . Vitamin D deficiency    Past Surgical History:  Procedure Laterality Date  . ABDOMINAL HYSTERECTOMY  08/2015  . BREAST BIOPSY Left   . BREAST EXCISIONAL BIOPSY Right   . COLON RESECTION  1980's  . COLONOSCOPY    . ENDOMETRIAL BIOPSY  2009   negative  . INCONTINENCE SURGERY  2017  . RIGHT HEART CATH N/A 09/20/2017   Procedure: RIGHT HEART CATH;  Surgeon: Larey Dresser, MD;  Location: [facility];  Service: Cardiovascular;  Laterality: N/A;  . RIGHT/LEFT HEART CATH AND CORONARY ANGIOGRAPHY N/A 03/19/2018   Procedure: RIGHT/LEFT HEART CATH AND CORONARY ANGIOGRAPHY;  Surgeon: Larey Dresser, MD;  Location: [facility];  Service: Cardiovascular;  Laterality: N/A;   Family History  Problem Relation Age of Onset  . Ovarian cancer Mother   . Breast cancer Neg Hx   . Hyperparathyroidism Neg Hx   . Colon cancer Neg Hx   . Esophageal cancer Neg Hx   . Stomach cancer Neg Hx    Social History:  reports that she quit smoking about 31 years ago. Her smoking use included cigarettes. She has a 30.00 pack-year smoking history. She has never used smokeless tobacco. She reports  that she does not drink alcohol or use drugs. Allergies: No Known Allergies Medications Prior to Admission  Medication Sig Dispense Refill  . albuterol (VENTOLIN HFA) 108 (90 Base) MCG/ACT inhaler INHALE 1-2 PUFFS INTO THE LUNGS EVERY 4 HOURS AS NEEDED FOR WHEEZING OR SHORTNESS OF BREATH. (Patient taking differently: Inhale 1-2 puffs into the lungs every 4 (four) hours as needed for wheezing or shortness of breath. INHALE 1-2 PUFFS INTO THE LUNGS EVERY 4 HOURS AS NEEDED FOR WHEEZING OR SHORTNESS OF BREATH.) 6.7 g 5  . aspirin EC 81 MG EC tablet Take 1 tablet (81 mg total) by mouth daily. 30 tablet 0  . atorvastatin (LIPITOR) 10 MG tablet TAKE 1 TABLET BY MOUTH EVERYDAY AT BEDTIME  (Patient taking differently: Take 10 mg by mouth at bedtime. ) 90 tablet 3  . bisoprolol (ZEBETA) 5 MG tablet Take 2.5 mg by mouth daily.    . famotidine (PEPCID) 40 MG tablet TAKE 1 TABLET BY MOUTH TWICE A DAY (Patient taking differently: Take 40 mg by mouth 2 (two) times daily. ) 180 tablet 0  . furosemide (LASIX) 40 MG tablet Take 1 tablet (40 mg total) by mouth daily. 30 tablet 11  . isosorbide-hydrALAZINE (BIDIL) 20-37.5 MG tablet Take 2 tablets by mouth 3 (three) times daily. 180 tablet 3  . KLOR-CON M10 10 MEQ tablet TAKE 2 TABLETS (20 MEQ TOTAL) BY MOUTH DAILY. FOR LOW POTASSIUM. (Patient taking differently: Take 5 mEq by mouth daily. ) 60 tablet 3  . losartan (COZAAR) 100 MG tablet Take 50 mg by mouth daily.    . metoprolol succinate (TOPROL-XL) 25 MG 24 hr tablet Take 1 tablet (25 mg total) by mouth daily. 30 tablet 1  . mirtazapine (REMERON) 15 MG tablet TAKE 1 TABLET BY MOUTH AT BEDTIME. FOR DEPRESSION AND APPETITE. (Patient taking differently: Take 15 mg by mouth at bedtime. ) 90 tablet 3  . OXYGEN Inhale 4 L into the lungs continuous.     . Polyethylene Glycol 3350 (MIRALAX PO) Take 17 g by mouth as needed.    [name] spironolactone (ALDACTONE) 25 MG tablet Take 1 tablet (25 mg total) by mouth daily. Take at night (Patient taking differently: Take 25 mg by mouth every evening. ) 30 tablet 6  . Blood Glucose Calibration (ACCU-CHEK AVIVA) SOLN USE AS INSTRUCTED TO TEST BLOOD SUGAR DAILY (Patient taking differently: 1 each by Other route daily. ) 1 each 2  . Blood Glucose Monitoring Suppl (ACCU-CHEK AVIVA PLUS) w/Device KIT Use as instructed to test blood sugar up to 3 times daily (Patient taking differently: 1 each by Other route 3 (three) times daily. ) 1 kit 0  . Fluticasone-Umeclidin-Vilant (TRELEGY ELLIPTA) 100-62.5-25 MCG/INH AEPB Inhale 1 puff into the lungs daily.    [name] glucose blood (ACCU-CHEK AVIVA PLUS) test strip Use as instructed to test blood sugar up to 3 times daily 300 each 1  .  Lancets (ACCU-CHEK SOFT TOUCH) lancets Use as instructed to test blood sugar up to 3 times daily (Patient taking differently: 1 each by Other route 3 (three) times daily. ) 300 each 1    Home: [place] expects to be discharged to:: Private residence Living Arrangements: Spouse/significant other Available Help at Discharge: Family, Available 24 hours/day Type of Home: House Home Access: Stairs to enter [employer] of Steps: 4 Entrance Stairs-Rails: Right Home Layout: One level, Laundry or work area in [place] Shower/Tub: Chiropodist: Standard Home Equipment: Environmental consultant - 2 wheels Additional Comments:  home O2; home info taken with PT collateral who spoke with husband. Pt able to recall most information  Lives With: Spouse  Functional History: Prior Function Level of Independence: Independent Comments: reports not using walker Functional Status:  Mobility: Bed Mobility Overal bed mobility: Needs Assistance Bed Mobility: Supine to Sit Supine to sit: Min guard General bed mobility comments: min guard for safety, increased time and use of rails Transfers Overall transfer level: Needs assistance Equipment used: 1 person hand held assist Transfers: Stand Pivot Transfers Sit to Stand: Min assist, Mod assist Stand pivot transfers: Min assist, Mod assist General transfer comment: min A to rise, mod A to steady over to recliner. L knee blocked due to weakness Ambulation/Gait Ambulation/Gait assistance: Min assist Gait Distance (Feet): 15 Feet(x 2) Assistive device: Rolling walker (2 wheeled) Gait Pattern/deviations: Step-to pattern, Step-through pattern, Shuffle, Trunk flexed General Gait Details: incontinent of urine on the way to the bathroom with RW min A for balance/safety; assist back to recliner on 4-6L portable O2 throughout.  SpO2 once back in chair 77%on 6L, recovered to 100% back on 4L in 2 minutes.    ADL: ADL Overall  ADL's : Needs assistance/impaired Eating/Feeding: Set up, Sitting Eating/Feeding Details (indicate cue type and reason): set up to open packaging and arrange tray for successful feeding. Pt able to manage soft foods with R hand utensils Grooming: Set up, Sitting Upper Body Bathing: Sitting, Moderate assistance Lower Body Bathing: Sit to/from stand, Sitting/lateral leans, Maximal assistance Upper Body Dressing : Moderate assistance, Sitting Lower Body Dressing: Maximal assistance, Sit to/from stand, Sitting/lateral leans Toilet Transfer: Minimal assistance, Moderate assistance, BSC, Stand-pivot Toilet Transfer Details (indicate cue type and reason): min A to rise, mod A for steadying with stand pivots- simulated with recliner Toileting- Clothing Manipulation and Hygiene: Moderate assistance, Sit to/from stand, Sitting/lateral lean Functional mobility during ADLs: Minimal assistance, Moderate assistance General ADL Comments: pt limited by cognitive deficits, L sided weakness, and decreased activity tolerance  Cognition: Cognition Overall Cognitive Status: Impaired/Different from baseline Arousal/Alertness: Awake/alert Orientation Level: Oriented X4 Attention: Selective Selective Attention: Appears intact Memory: Impaired Memory Impairment: Other (comment)(benefits from repetition of information) Awareness: Impaired Awareness Impairment: (intermittent recall of immediate plans) Cognition Arousal/Alertness: Awake/alert Behavior During Therapy: WFL for tasks assessed/performed Overall Cognitive Status: Impaired/Different from baseline Area of Impairment: Safety/judgement, Attention, Orientation, Memory Orientation Level: Disoriented to, Situation Current Attention Level: Sustained Memory: Decreased recall of precautions, Decreased short-term memory(oriented to situation, asked to recall at end of session and was unable) Safety/Judgement: Decreased awareness of safety, Decreased awareness  of deficits General Comments: difficulty with recall, aware her L side is weak but not the implications it has on function and safety. Decreased attention, requiring cues. Unsure if any of this is baseline, husband not present  Blood pressure 123/68, pulse 67, temperature (!) 97.5 F (36.4 C), temperature source Oral, resp. rate 18, height _0  (1.676 m), weight 44.9 kg, SpO2 94 %. Physical Exam   General: Alert and oriented x 3, No apparent distress, sitting up in bed with her husband at bedside.  HEENT: Head is normocephalic, atraumatic, PERRLA, EOMI, sclera anicteric, oral mucosa pink and moist, dentition intact, ext ear canals clear.  Neck: Supple without JVD or lymphadenopathy Heart: Reg rate and rhythm. No murmurs rubs or gallops Chest: CTA bilaterally without wheezes, rales, or rhonchi; no distress Abdomen: Soft, non-tender, non-distended, bowel sounds positive. Extremities: No clubbing, cyanosis, or edema. Pulses are 2+ Skin: Clean and intact without signs of breakdown Neuro:  Pt  is cognitively appropriate with normal insight, memory, and awareness.  Mild dysarthria and left facial droop. Cranial nerves 2-12 are otherwise intact. Sensory exam is normal.  Reflexes are 2+ in all 4's.  Fine motor coordination is impaired on the left side.  No tremors.  Motor function is grossly 5/5 except 4/5 in left bicep and 3/5 in left WE and hand grip.  Musculoskeletal: Full ROM, No pain with AROM or PROM in the neck, trunk, or extremities. Posture appropriate Psych: [name] affect is appropriate. Pt is cooperative   Results for orders placed or performed during the hospital encounter of 11/04/18 (from the past 24 hour(s))  Glucose, capillary     Status: Abnormal   Collection Time: 11/06/18 11:38 AM  Result Value Ref Range   Glucose-Capillary 167 (H) 70 - 99 mg/dL   Comment 1 Notify RN    Comment 2 Document in Chart   Glucose, capillary     Status: Abnormal   Collection Time: 11/06/18  3:34  PM  Result Value Ref Range   Glucose-Capillary 171 (H) 70 - 99 mg/dL  Glucose, capillary     Status: None   Collection Time: 11/06/18 10:14 PM  Result Value Ref Range   Glucose-Capillary 93 70 - 99 mg/dL  Glucose, capillary     Status: None   Collection Time: 11/06/18 10:52 PM  Result Value Ref Range   Glucose-Capillary 95 70 - 99 mg/dL  CBC     Status: Abnormal   Collection Time: 11/07/18  5:04 AM  Result Value Ref Range   WBC 5.3 4.0 - 10.5 K/uL   RBC 4.57 3.87 - 5.11 MIL/uL   Hemoglobin 11.9 (L) 12.0 - 15.0 g/dL   HCT 37.7 36.0 - 46.0 %   MCV 82.5 80.0 - 100.0 fL   MCH 26.0 26.0 - 34.0 pg   MCHC 31.6 30.0 - 36.0 g/dL   RDW 14.8 11.5 - 15.5 %   Platelets 219 150 - 400 K/uL   nRBC 0.0 0.0 - 0.2 %  Basic metabolic panel     Status: Abnormal   Collection Time: 11/07/18  5:04 AM  Result Value Ref Range   Sodium 142 135 - 145 mmol/L   Potassium 3.0 (L) 3.5 - 5.1 mmol/L   Chloride 99 98 - 111 mmol/L   CO2 33 (H) 22 - 32 mmol/L   Glucose, Bld 128 (H) 70 - 99 mg/dL   BUN 22 8 - 23 mg/dL   Creatinine, Ser 1.17 (H) 0.44 - 1.00 mg/dL   Calcium 10.7 (H) 8.9 - 10.3 mg/dL   GFR calc non Af Amer 46 (L) >60 mL/min   GFR calc Af Amer 54 (L) >60 mL/min   Anion gap 10 5 - 15  Glucose, capillary     Status: Abnormal   Collection Time: 11/07/18  6:13 AM  Result Value Ref Range   Glucose-Capillary 111 (H) 70 - 99 mg/dL   Comment 1 Notify RN    Comment 2 Document in Chart    Ct Head Wo Contrast  Result Date: 11/07/2018 CLINICAL DATA:  Stroke follow-up EXAM: CT HEAD WITHOUT CONTRAST TECHNIQUE: Contiguous axial images were obtained from the base of the skull through the vertex without intravenous contrast. COMPARISON:  CT and brain MRI from 2 days ago FINDINGS: Brain: Unchanged area of hemorrhage in the inferior right cerebellum with regional edema. Measurement is confounded by mixed density from brain parenchyma intervening blood. Local mass effect with fourth ventricular narrowing is  stable. New  small to moderate superficial left inferior cerebellar infarct. Small cortical infarcts along the right cerebral convexity that are known from comparison MRI. Nonacute small left superior frontal cortex infarct. No hydrocephalus or extra-axial collection. Vascular: Atherosclerosis.  No hyperdense vessel. Skull: Negative Sinuses/Orbits: Negative These results were communicated to at 9:41 amon 11/5/2020by text page via the [facility] messaging system. IMPRESSION: 1. New small to moderate infarct in the lateral left cerebellum. 2. Unchanged appearance of hemorrhagic infarct with swelling in the inferior right cerebellum. No hydrocephalus. 3. Unchanged appearance of nonhemorrhagic small cortical infarcts along the right cerebral convexity. Electronically Signed   By: Monte Fantasia M.D.   On: 11/07/2018 09:42   Mr Angio Head Wo Contrast  Result Date: 11/05/2018 CLINICAL DATA:  Stroke, follow-up. EXAM: MRI HEAD WITHOUT CONTRAST MRA HEAD WITHOUT CONTRAST TECHNIQUE: Multiplanar, multiecho pulse sequences of the brain and surrounding structures were obtained without intravenous contrast. Angiographic images of the head were obtained using MRA technique without contrast. COMPARISON:  Prior head CT examinations 11/05/2018 and earlier FINDINGS: MRI HEAD FINDINGS Brain: There are multiple small acute/subacute cortical infarcts within the right frontal, parietal and occipital lobes in the right middle cerebral artery vascular territory. Of note, this includes small acute/subacute cortical infarcts within the right frontal lobe motor strip. Subtle petechial hemorrhage at site of several of these small acute infarct A large hemorrhagic right cerebellar infarct is difficult to accurately measure by MR modality, although appears similar to head CT performed earlier the same day. Posterior fossa mass effect appears similar to prior examination with persistent partial effacement of the inferior fourth ventricle and  inferior displacement of the right cerebellar tonsil. No hydrocephalus. There are small chronic hemorrhagic cortical infarcts within the left frontal lobe (involving the left superior and middle frontal gyri) and paramedian left parietal lobe. Additional small chronic hemorrhagic and nonhemorrhagic cortical infarcts within the bilateral occipital lobes. Chronic lacunar infarcts within the right thalamus and left cerebellum. There is a background of mild chronic small vessel ischemic disease. Mild generalized parenchymal atrophy. No supratentorial midline shift. No extra-axial fluid collection. Vascular: Reported separately Skull and upper cervical spine: The sagittal T1 weighted sequence is motion degraded. Within this limitation, no focal marrow lesion is identified Sinuses/Orbits: Visualized orbits demonstrate no acute abnormality. Trace ethmoid sinus mucosal thickening. No significant mastoid effusion MRA HEAD FINDINGS The intracranial internal carotid arteries are patent. Atherosclerotic irregularity of these vessels bilaterally with sites of mild stenosis. The bilateral middle and anterior cerebral arteries are patent without significant proximal stenosis. No intracranial aneurysm is identified. The intracranial vertebral arteries are patent bilaterally. Atherosclerotic irregularity of the non dominant intracranial left vertebral artery with sites of up to moderate stenosis. The basilar artery is patent without significant stenosis. The posterior cerebral arteries are patent bilaterally. Apparent mild focal stenosis within the proximal P2 right posterior cerebral artery. These results were called by telephone at the time of interpretation on 11/05/2018 at 5:35 pm to provider [facility], who verbally acknowledged these results. IMPRESSION: MR brain: 1. Large acute/subacute hemorrhagic right cerebellar infarct not significantly changed from head CT earlier the same day. Posterior fossa mass effect is also  similar with partial effacement of the fourth ventricle. No hydrocephalus 2. Multiple additional small acute/subacute cortical infarcts within the right MCA vascular territory, some of which demonstrate subtle petechial hemorrhage. 3. Multiple small chronic hemorrhagic and nonhemorrhagic cortical infarcts within the bilateral cerebral hemispheres. 4. Small chronic lacunar infarcts within the right thalamus and left cerebellum. Background of mild chronic small  vessel ischemic disease. MRA head: 1. No intracranial large vessel occlusion. 2. Intracranial atherosclerotic disease. Most notably, there are sites of up to moderate stenosis within the non dominant left left vertebral artery. Additional mild stenoses as described. Electronically Signed   By: Kellie Simmering DO   On: 11/05/2018 17:38   Mr Brain Wo Contrast  Result Date: 11/05/2018 CLINICAL DATA:  Stroke, follow-up. EXAM: MRI HEAD WITHOUT CONTRAST MRA HEAD WITHOUT CONTRAST TECHNIQUE: Multiplanar, multiecho pulse sequences of the brain and surrounding structures were obtained without intravenous contrast. Angiographic images of the head were obtained using MRA technique without contrast. COMPARISON:  Prior head CT examinations 11/05/2018 and earlier FINDINGS: MRI HEAD FINDINGS Brain: There are multiple small acute/subacute cortical infarcts within the right frontal, parietal and occipital lobes in the right middle cerebral artery vascular territory. Of note, this includes small acute/subacute cortical infarcts within the right frontal lobe motor strip. Subtle petechial hemorrhage at site of several of these small acute infarct A large hemorrhagic right cerebellar infarct is difficult to accurately measure by MR modality, although appears similar to head CT performed earlier the same day. Posterior fossa mass effect appears similar to prior examination with persistent partial effacement of the inferior fourth ventricle and inferior displacement of the right  cerebellar tonsil. No hydrocephalus. There are small chronic hemorrhagic cortical infarcts within the left frontal lobe (involving the left superior and middle frontal gyri) and paramedian left parietal lobe. Additional small chronic hemorrhagic and nonhemorrhagic cortical infarcts within the bilateral occipital lobes. Chronic lacunar infarcts within the right thalamus and left cerebellum. There is a background of mild chronic small vessel ischemic disease. Mild generalized parenchymal atrophy. No supratentorial midline shift. No extra-axial fluid collection. Vascular: Reported separately Skull and upper cervical spine: The sagittal T1 weighted sequence is motion degraded. Within this limitation, no focal marrow lesion is identified Sinuses/Orbits: Visualized orbits demonstrate no acute abnormality. Trace ethmoid sinus mucosal thickening. No significant mastoid effusion MRA HEAD FINDINGS The intracranial internal carotid arteries are patent. Atherosclerotic irregularity of these vessels bilaterally with sites of mild stenosis. The bilateral middle and anterior cerebral arteries are patent without significant proximal stenosis. No intracranial aneurysm is identified. The intracranial vertebral arteries are patent bilaterally. Atherosclerotic irregularity of the non dominant intracranial left vertebral artery with sites of up to moderate stenosis. The basilar artery is patent without significant stenosis. The posterior cerebral arteries are patent bilaterally. Apparent mild focal stenosis within the proximal P2 right posterior cerebral artery. These results were called by telephone at the time of interpretation on 11/05/2018 at 5:35 pm to provider [facility], who verbally acknowledged these results. IMPRESSION: MR brain: 1. Large acute/subacute hemorrhagic right cerebellar infarct not significantly changed from head CT earlier the same day. Posterior fossa mass effect is also similar with partial effacement of the  fourth ventricle. No hydrocephalus 2. Multiple additional small acute/subacute cortical infarcts within the right MCA vascular territory, some of which demonstrate subtle petechial hemorrhage. 3. Multiple small chronic hemorrhagic and nonhemorrhagic cortical infarcts within the bilateral cerebral hemispheres. 4. Small chronic lacunar infarcts within the right thalamus and left cerebellum. Background of mild chronic small vessel ischemic disease. MRA head: 1. No intracranial large vessel occlusion. 2. Intracranial atherosclerotic disease. Most notably, there are sites of up to moderate stenosis within the non dominant left left vertebral artery. Additional mild stenoses as described. Electronically Signed   By: Kellie Simmering DO   On: 11/05/2018 17:38   Vas [place] Lower Extremity Venous (dvt)  Result Date:  11/06/2018  Lower Venous Study Indications: Stroke.  Comparison Study: no prior Performing Technologist: Abram Sander RVS  Examination Guidelines: A complete evaluation includes B-mode imaging, spectral Doppler, color Doppler, and power Doppler as needed of all accessible portions of each vessel. Bilateral testing is considered an integral part of a complete examination. Limited examinations for reoccurring indications may be performed as noted.  +---------+---------------+---------+-----------+----------+--------------+ RIGHT    CompressibilityPhasicitySpontaneityPropertiesThrombus Aging +---------+---------------+---------+-----------+----------+--------------+ CFV      Full           Yes      Yes                                 +---------+---------------+---------+-----------+----------+--------------+ SFJ      Full                                                        +---------+---------------+---------+-----------+----------+--------------+ FV Prox  Full                                                        +---------+---------------+---------+-----------+----------+--------------+  FV Mid   Full                                                        +---------+---------------+---------+-----------+----------+--------------+ FV DistalFull                                                        +---------+---------------+---------+-----------+----------+--------------+ PFV      Full                                                        +---------+---------------+---------+-----------+----------+--------------+ POP      Full           Yes      Yes                                 +---------+---------------+---------+-----------+----------+--------------+ PTV      Full                                                        +---------+---------------+---------+-----------+----------+--------------+ PERO     Full                                                        +---------+---------------+---------+-----------+----------+--------------+   +---------+---------------+---------+-----------+----------+--------------+  LEFT     CompressibilityPhasicitySpontaneityPropertiesThrombus Aging +---------+---------------+---------+-----------+----------+--------------+ CFV      Full           Yes      Yes                                 +---------+---------------+---------+-----------+----------+--------------+ SFJ      Full                                                        +---------+---------------+---------+-----------+----------+--------------+ FV Prox  Full                                                        +---------+---------------+---------+-----------+----------+--------------+ FV Mid   Full                                                        +---------+---------------+---------+-----------+----------+--------------+ FV DistalFull                                                        +---------+---------------+---------+-----------+----------+--------------+ PFV      Full                                                         +---------+---------------+---------+-----------+----------+--------------+ POP      Full           Yes      Yes                                 +---------+---------------+---------+-----------+----------+--------------+ PTV      Full                                                        +---------+---------------+---------+-----------+----------+--------------+ PERO     Full                                                        +---------+---------------+---------+-----------+----------+--------------+     Summary: Right: There is no evidence of deep vein thrombosis in the lower extremity. No cystic structure found in the popliteal fossa. Left: There is no evidence of deep vein thrombosis in the lower extremity. No cystic structure found in the popliteal fossa.  *  See table(s) above for measurements and observations. Electronically signed by Curt Jews MD on 11/06/2018 at 6:27:42 PM.    Final     Assessment and Plan: Denise Gain. Klinkner is a 73 y.o. female w/ diabetes, HTN, HLD, colon cancer, COPD, chronic respiratory failure with hypoxia, and systolic CHF with dyspnea on exertion, who was admitted with sudden onset of left-sided weakness. 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 Rehabilitation medicine was consulted regarding patient's decreased independence and mobility due to L UE impaired coordination and strength deficits, as well as impaired balance.   Impairments: Left hand weakness Impaired balance Impaired coordination Decreased strength Decreased appetite Dysarthria -Denise [name] would benefit from inpatient rehabilitation to focus on treating the above impairments. She would be able to tolerate 3 hours of daily therapy, consisting of PT, OT, and SLP.  -Educated patient and husband regarding the deficits expected with a cerebellar stroke and regarding the expected rehabilitation  plan.  -Continue Mirtazepine 75m HS to encourage appetite.   KVicente Masson MD 11/07/2018

## 2018-11-07 NOTE — Progress Notes (Signed)
Inpatient Rehabilitation Admissions Coordinator  Inpatient rehab consult received. I met with patient with her spouse at bedside for rehab assessment. We discussed goals and expectations of an inpt rehab admit. They are in agreement to possible admit. I will begin insurance approval with [facility] Medicare for admit Friday pending their approval and medical workup completion.  Danne Baxter, RN, MSN Rehab Admissions Coordinator [phone] 11/07/2018 12:48 PM

## 2018-11-08 ENCOUNTER — Inpatient Hospital Stay (HOSPITAL_COMMUNITY)
Admission: RE | Admit: 2018-11-08 | Discharge: 2018-11-19 | DRG: 057 | Disposition: A | Discharge status: Home Health | Payer: Medicare HMO | Source: Intra-hospital | Attending: Physical Medicine & Rehabilitation | Admitting: Physical Medicine & Rehabilitation

## 2018-11-08 ENCOUNTER — Encounter (HOSPITAL_COMMUNITY)
Admission: EM | Disposition: A | Discharge status: Rehabilitation Facility | Payer: Self-pay | Source: Home / Self Care | Attending: Neurology

## 2018-11-08 ENCOUNTER — Encounter (HOSPITAL_COMMUNITY): Payer: Self-pay

## 2018-11-08 DIAGNOSIS — Z87891 Personal history of nicotine dependence: Secondary | ICD-10-CM | POA: Diagnosis not present

## 2018-11-08 DIAGNOSIS — I160 Hypertensive urgency: Secondary | ICD-10-CM | POA: Diagnosis present

## 2018-11-08 DIAGNOSIS — Z833 Family history of diabetes mellitus: Secondary | ICD-10-CM

## 2018-11-08 DIAGNOSIS — N179 Acute kidney failure, unspecified: Secondary | ICD-10-CM | POA: Diagnosis not present

## 2018-11-08 DIAGNOSIS — E785 Hyperlipidemia, unspecified: Secondary | ICD-10-CM | POA: Diagnosis present

## 2018-11-08 DIAGNOSIS — J9611 Chronic respiratory failure with hypoxia: Secondary | ICD-10-CM | POA: Diagnosis present

## 2018-11-08 DIAGNOSIS — B3731 Acute candidiasis of vulva and vagina: Secondary | ICD-10-CM

## 2018-11-08 DIAGNOSIS — I422 Other hypertrophic cardiomyopathy: Secondary | ICD-10-CM | POA: Diagnosis not present

## 2018-11-08 DIAGNOSIS — F419 Anxiety disorder, unspecified: Secondary | ICD-10-CM | POA: Diagnosis present

## 2018-11-08 DIAGNOSIS — Z9981 Dependence on supplemental oxygen: Secondary | ICD-10-CM | POA: Diagnosis not present

## 2018-11-08 DIAGNOSIS — E876 Hypokalemia: Secondary | ICD-10-CM | POA: Diagnosis present

## 2018-11-08 DIAGNOSIS — R7309 Other abnormal glucose: Secondary | ICD-10-CM

## 2018-11-08 DIAGNOSIS — Z8041 Family history of malignant neoplasm of ovary: Secondary | ICD-10-CM

## 2018-11-08 DIAGNOSIS — Z7409 Other reduced mobility: Secondary | ICD-10-CM | POA: Diagnosis not present

## 2018-11-08 DIAGNOSIS — Z8249 Family history of ischemic heart disease and other diseases of the circulatory system: Secondary | ICD-10-CM

## 2018-11-08 DIAGNOSIS — I5022 Chronic systolic (congestive) heart failure: Secondary | ICD-10-CM | POA: Diagnosis present

## 2018-11-08 DIAGNOSIS — Z789 Other specified health status: Secondary | ICD-10-CM | POA: Diagnosis not present

## 2018-11-08 DIAGNOSIS — Z85038 Personal history of other malignant neoplasm of large intestine: Secondary | ICD-10-CM

## 2018-11-08 DIAGNOSIS — I2781 Cor pulmonale (chronic): Secondary | ICD-10-CM | POA: Diagnosis present

## 2018-11-08 DIAGNOSIS — I10 Essential (primary) hypertension: Secondary | ICD-10-CM | POA: Diagnosis present

## 2018-11-08 DIAGNOSIS — E1122 Type 2 diabetes mellitus with diabetic chronic kidney disease: Secondary | ICD-10-CM | POA: Diagnosis not present

## 2018-11-08 DIAGNOSIS — Z7982 Long term (current) use of aspirin: Secondary | ICD-10-CM | POA: Diagnosis not present

## 2018-11-08 DIAGNOSIS — I639 Cerebral infarction, unspecified: Secondary | ICD-10-CM | POA: Diagnosis present

## 2018-11-08 DIAGNOSIS — I619 Nontraumatic intracerebral hemorrhage, unspecified: Secondary | ICD-10-CM

## 2018-11-08 DIAGNOSIS — B373 Candidiasis of vulva and vagina: Secondary | ICD-10-CM

## 2018-11-08 DIAGNOSIS — F329 Major depressive disorder, single episode, unspecified: Secondary | ICD-10-CM | POA: Diagnosis present

## 2018-11-08 DIAGNOSIS — I130 Hypertensive heart and chronic kidney disease with heart failure and stage 1 through stage 4 chronic kidney disease, or unspecified chronic kidney disease: Secondary | ICD-10-CM | POA: Diagnosis not present

## 2018-11-08 DIAGNOSIS — N189 Chronic kidney disease, unspecified: Secondary | ICD-10-CM | POA: Diagnosis not present

## 2018-11-08 DIAGNOSIS — K5790 Diverticulosis of intestine, part unspecified, without perforation or abscess without bleeding: Secondary | ICD-10-CM | POA: Diagnosis present

## 2018-11-08 DIAGNOSIS — E559 Vitamin D deficiency, unspecified: Secondary | ICD-10-CM | POA: Diagnosis present

## 2018-11-08 DIAGNOSIS — J449 Chronic obstructive pulmonary disease, unspecified: Secondary | ICD-10-CM | POA: Diagnosis present

## 2018-11-08 DIAGNOSIS — B3749 Other urogenital candidiasis: Secondary | ICD-10-CM | POA: Diagnosis present

## 2018-11-08 DIAGNOSIS — E210 Primary hyperparathyroidism: Secondary | ICD-10-CM | POA: Diagnosis present

## 2018-11-08 DIAGNOSIS — E1165 Type 2 diabetes mellitus with hyperglycemia: Secondary | ICD-10-CM | POA: Diagnosis not present

## 2018-11-08 DIAGNOSIS — D62 Acute posthemorrhagic anemia: Secondary | ICD-10-CM | POA: Diagnosis not present

## 2018-11-08 DIAGNOSIS — Z800 Family history of malignant neoplasm of digestive organs: Secondary | ICD-10-CM

## 2018-11-08 DIAGNOSIS — Z825 Family history of asthma and other chronic lower respiratory diseases: Secondary | ICD-10-CM

## 2018-11-08 DIAGNOSIS — I69154 Hemiplegia and hemiparesis following nontraumatic intracerebral hemorrhage affecting left non-dominant side: Principal | ICD-10-CM

## 2018-11-08 HISTORY — PX: LOOP RECORDER INSERTION: EP1214

## 2018-11-08 HISTORY — DX: Cerebral infarction, unspecified: I63.9

## 2018-11-08 HISTORY — DX: Hypertensive urgency: I16.0

## 2018-11-08 HISTORY — DX: Nontraumatic intracerebral hemorrhage, unspecified: I61.9

## 2018-11-08 LAB — CBC
HCT: 35.9 % — ABNORMAL LOW (ref 36.0–46.0)
Hemoglobin: 11.3 g/dL — ABNORMAL LOW (ref 12.0–15.0)
MCH: 25.7 pg — ABNORMAL LOW (ref 26.0–34.0)
MCHC: 31.5 g/dL (ref 30.0–36.0)
MCV: 81.8 fL (ref 80.0–100.0)
Platelets: 211 10*3/uL (ref 150–400)
RBC: 4.39 MIL/uL (ref 3.87–5.11)
RDW: 14.7 % (ref 11.5–15.5)
WBC: 4.8 10*3/uL (ref 4.0–10.5)
nRBC: 0 % (ref 0.0–0.2)

## 2018-11-08 LAB — BASIC METABOLIC PANEL
Anion gap: 8 (ref 5–15)
BUN: 22 mg/dL (ref 8–23)
CO2: 33 mmol/L — ABNORMAL HIGH (ref 22–32)
Calcium: 10.4 mg/dL — ABNORMAL HIGH (ref 8.9–10.3)
Chloride: 100 mmol/L (ref 98–111)
Creatinine, Ser: 1.15 mg/dL — ABNORMAL HIGH (ref 0.44–1.00)
GFR calc Af Amer: 55 mL/min — ABNORMAL LOW (ref >60)
GFR calc non Af Amer: 47 mL/min — ABNORMAL LOW (ref >60)
Glucose, Bld: 177 mg/dL — ABNORMAL HIGH (ref 70–99)
Potassium: 3.2 mmol/L — ABNORMAL LOW (ref 3.5–5.1)
Sodium: 141 mmol/L (ref 135–145)

## 2018-11-08 LAB — GLUCOSE, CAPILLARY
Glucose-Capillary: 118 mg/dL — ABNORMAL HIGH (ref 70–99)
Glucose-Capillary: 111 mg/dL — ABNORMAL HIGH (ref 70–99)
Glucose-Capillary: 173 mg/dL — ABNORMAL HIGH (ref 70–99)
Glucose-Capillary: 176 mg/dL — ABNORMAL HIGH (ref 70–99)

## 2018-11-08 SURGERY — LOOP RECORDER INSERTION

## 2018-11-08 MED ORDER — ALBUTEROL SULFATE (2.5 MG/3ML) 0.083% IN NEBU
2.5000 mg | INHALATION_SOLUTION | RESPIRATORY_TRACT | Status: DC | PRN
  Administered 2018-11-12: 2.5 mg via RESPIRATORY_TRACT
  Filled 2018-11-08: qty 3

## 2018-11-08 MED ORDER — MIRTAZAPINE 15 MG PO TABS
15.0000 mg | ORAL_TABLET | Freq: Every day | ORAL | Status: DC
  Administered 2018-11-08 – 2018-11-18 (×11): 15 mg via ORAL
  Filled 2018-11-08 (×11): qty 1

## 2018-11-08 MED ORDER — CLONIDINE HCL 0.1 MG PO TABS
0.1000 mg | ORAL_TABLET | Freq: Four times a day (QID) | ORAL | Status: DC | PRN
  Filled 2018-11-08: qty 1

## 2018-11-08 MED ORDER — SODIUM CHLORIDE 0.9 % IV SOLN: 30.0000 mL | INTRAVENOUS | 0 refills | Status: DC

## 2018-11-08 MED ORDER — POLYETHYLENE GLYCOL 3350 17 G PO PACK: 17.0000 g | PACK | Freq: Every day | ORAL | Status: DC | PRN

## 2018-11-08 MED ORDER — ALUM & MAG HYDROXIDE-SIMETH 200-200-20 MG/5ML PO SUSP: 30.0000 mL | ORAL | Status: DC | PRN

## 2018-11-08 MED ORDER — FUROSEMIDE 40 MG PO TABS
40.0000 mg | ORAL_TABLET | Freq: Every day | ORAL | Status: DC
  Administered 2018-11-09 – 2018-11-19 (×11): 40 mg via ORAL
  Filled 2018-11-08 (×11): qty 1

## 2018-11-08 MED ORDER — GUAIFENESIN-DM 100-10 MG/5ML PO SYRP: 5.0000 mL | ORAL_SOLUTION | Freq: Four times a day (QID) | ORAL | Status: DC | PRN

## 2018-11-08 MED ORDER — POTASSIUM CHLORIDE CRYS ER 20 MEQ PO TBCR
20.0000 meq | EXTENDED_RELEASE_TABLET | Freq: Two times a day (BID) | ORAL | Status: DC
  Administered 2018-11-08 – 2018-11-16 (×16): 20 meq via ORAL
  Filled 2018-11-08 (×16): qty 1

## 2018-11-08 MED ORDER — FAMOTIDINE 20 MG PO TABS
20.0000 mg | ORAL_TABLET | Freq: Every day | ORAL | Status: DC
  Administered 2018-11-09 – 2018-11-19 (×11): 20 mg via ORAL
  Filled 2018-11-08 (×11): qty 1

## 2018-11-08 MED ORDER — INSULIN ASPART 100 UNIT/ML ~~LOC~~ SOLN
0.0000 [IU] | Freq: Three times a day (TID) | SUBCUTANEOUS | Status: DC
  Administered 2018-11-09: 3 [IU] via SUBCUTANEOUS
  Administered 2018-11-09: 2 [IU] via SUBCUTANEOUS
  Administered 2018-11-10: 8 [IU] via SUBCUTANEOUS
  Administered 2018-11-11 (×2): 3 [IU] via SUBCUTANEOUS
  Administered 2018-11-12: 2 [IU] via SUBCUTANEOUS
  Administered 2018-11-12 – 2018-11-13 (×2): 3 [IU] via SUBCUTANEOUS
  Administered 2018-11-13: 2 [IU] via SUBCUTANEOUS
  Administered 2018-11-14: 3 [IU] via SUBCUTANEOUS
  Administered 2018-11-14 – 2018-11-15 (×3): 2 [IU] via SUBCUTANEOUS
  Administered 2018-11-15 – 2018-11-16 (×2): 5 [IU] via SUBCUTANEOUS
  Administered 2018-11-16: 2 [IU] via SUBCUTANEOUS
  Administered 2018-11-16 – 2018-11-17 (×3): 3 [IU] via SUBCUTANEOUS
  Administered 2018-11-18: 2 [IU] via SUBCUTANEOUS
  Administered 2018-11-18: 5 [IU] via SUBCUTANEOUS
  Administered 2018-11-18 – 2018-11-19 (×2): 2 [IU] via SUBCUTANEOUS

## 2018-11-08 MED ORDER — TRAZODONE HCL 50 MG PO TABS: 25.0000 mg | ORAL_TABLET | Freq: Every evening | ORAL | Status: DC | PRN

## 2018-11-08 MED ORDER — SENNOSIDES-DOCUSATE SODIUM 8.6-50 MG PO TABS: 1.0000 | ORAL_TABLET | Freq: Every evening | ORAL | Status: DC | PRN

## 2018-11-08 MED ORDER — FLUTICASONE FUROATE-VILANTEROL 100-25 MCG/INH IN AEPB
1.0000 | INHALATION_SPRAY | Freq: Every day | RESPIRATORY_TRACT | Status: DC
  Administered 2018-11-09 – 2018-11-19 (×11): 1 via RESPIRATORY_TRACT
  Filled 2018-11-08 (×2): qty 28

## 2018-11-08 MED ORDER — SPIRONOLACTONE 25 MG PO TABS
25.0000 mg | ORAL_TABLET | Freq: Every evening | ORAL | Status: DC
  Administered 2018-11-09 – 2018-11-18 (×10): 25 mg via ORAL
  Filled 2018-11-08 (×10): qty 1

## 2018-11-08 MED ORDER — CHLORHEXIDINE GLUCONATE CLOTH 2 % EX PADS: 6.0000 | MEDICATED_PAD | Freq: Every day | CUTANEOUS | Status: DC

## 2018-11-08 MED ORDER — UMECLIDINIUM BROMIDE 62.5 MCG/INH IN AEPB
1.0000 | INHALATION_SPRAY | Freq: Every day | RESPIRATORY_TRACT | Status: DC
  Administered 2018-11-09 – 2018-11-18 (×10): 1 via RESPIRATORY_TRACT
  Filled 2018-11-08 (×2): qty 7

## 2018-11-08 MED ORDER — ASPIRIN 325 MG PO TBEC: 325.0000 mg | DELAYED_RELEASE_TABLET | Freq: Every day | ORAL | 0 refills | Status: DC

## 2018-11-08 MED ORDER — ACETAMINOPHEN 325 MG PO TABS
325.0000 mg | ORAL_TABLET | ORAL | Status: DC | PRN
  Administered 2018-11-15: 650 mg via ORAL
  Filled 2018-11-08: qty 2

## 2018-11-08 MED ORDER — PROCHLORPERAZINE EDISYLATE 10 MG/2ML IJ SOLN: 5.0000 mg | Freq: Four times a day (QID) | INTRAMUSCULAR | Status: DC | PRN

## 2018-11-08 MED ORDER — INSULIN ASPART 100 UNIT/ML ~~LOC~~ SOLN: 0.0000 [IU] | Freq: Three times a day (TID) | SUBCUTANEOUS | 11 refills | Status: DC

## 2018-11-08 MED ORDER — FLEET ENEMA 7-19 GM/118ML RE ENEM: 1.0000 | ENEMA | Freq: Once | RECTAL | Status: DC | PRN

## 2018-11-08 MED ORDER — FAMOTIDINE 20 MG PO TABS: 20.0000 mg | ORAL_TABLET | Freq: Every day | ORAL | Status: DC

## 2018-11-08 MED ORDER — INSULIN ASPART 100 UNIT/ML ~~LOC~~ SOLN
0.0000 [IU] | Freq: Every day | SUBCUTANEOUS | Status: DC
  Administered 2018-11-09: 2 [IU] via SUBCUTANEOUS
  Administered 2018-11-15: 4 [IU] via SUBCUTANEOUS

## 2018-11-08 MED ORDER — ASPIRIN EC 325 MG PO TBEC
325.0000 mg | DELAYED_RELEASE_TABLET | Freq: Every day | ORAL | Status: DC
  Administered 2018-11-09 – 2018-11-19 (×11): 325 mg via ORAL
  Filled 2018-11-08 (×11): qty 1

## 2018-11-08 MED ORDER — INSULIN ASPART 100 UNIT/ML ~~LOC~~ SOLN: 0.0000 [IU] | Freq: Every day | SUBCUTANEOUS | 11 refills | Status: DC

## 2018-11-08 MED ORDER — BISACODYL 10 MG RE SUPP: 10.0000 mg | Freq: Every day | RECTAL | Status: DC | PRN

## 2018-11-08 MED ORDER — ORAL CARE MOUTH RINSE: 15.0000 mL | Freq: Two times a day (BID) | OROMUCOSAL | 0 refills | Status: DC

## 2018-11-08 MED ORDER — POTASSIUM CHLORIDE CRYS ER 10 MEQ PO TBCR: 5.0000 meq | EXTENDED_RELEASE_TABLET | Freq: Every day | ORAL | Status: DC

## 2018-11-08 MED ORDER — LIDOCAINE-EPINEPHRINE 1 %-1:100000 IJ SOLN
INTRAMUSCULAR | Status: DC | PRN
  Administered 2018-11-08: 10 mL

## 2018-11-08 MED ORDER — ATORVASTATIN CALCIUM 20 MG PO TABS: 20.0000 mg | ORAL_TABLET | Freq: Every day | ORAL | Status: DC

## 2018-11-08 MED ORDER — FUROSEMIDE 40 MG PO TABS: 40.0000 mg | ORAL_TABLET | Freq: Every day | ORAL | Status: DC

## 2018-11-08 MED ORDER — SPIRONOLACTONE 25 MG PO TABS: 25.0000 mg | ORAL_TABLET | Freq: Every evening | ORAL | Status: DC

## 2018-11-08 MED ORDER — ATORVASTATIN CALCIUM 10 MG PO TABS
20.0000 mg | ORAL_TABLET | Freq: Every day | ORAL | Status: DC
  Administered 2018-11-08 – 2018-11-18 (×11): 20 mg via ORAL
  Filled 2018-11-08 (×11): qty 2

## 2018-11-08 MED ORDER — PROCHLORPERAZINE MALEATE 5 MG PO TABS: 5.0000 mg | ORAL_TABLET | Freq: Four times a day (QID) | ORAL | Status: DC | PRN

## 2018-11-08 MED ORDER — LIDOCAINE-EPINEPHRINE 1 %-1:100000 IJ SOLN
INTRAMUSCULAR | Status: AC
  Filled 2018-11-08: qty 1

## 2018-11-08 MED ORDER — DIPHENHYDRAMINE HCL 12.5 MG/5ML PO ELIX: 12.5000 mg | ORAL_SOLUTION | Freq: Four times a day (QID) | ORAL | Status: DC | PRN

## 2018-11-08 MED ORDER — PROCHLORPERAZINE 25 MG RE SUPP: 12.5000 mg | Freq: Four times a day (QID) | RECTAL | Status: DC | PRN

## 2018-11-08 SURGICAL SUPPLY — 2 items
MONITOR REVEAL LINQ II (Prosthesis & Implant Heart) ×3 IMPLANT
PACK LOOP INSERTION (CUSTOM PROCEDURE TRAY) ×3 IMPLANT

## 2018-11-08 NOTE — Progress Notes (Signed)
Pt discharge education completed. RN attempted to report off to Rehab but was informed to call back to report off to HS shift nurse. Report called to CIR; to nurse Collie Siad. Pt chest incision site dsg has small serosanguinous stain marked. Pt to be transported off unit by oncoming RN to [id number] along with pt belongings in room. Delia Heady RN

## 2018-11-08 NOTE — Progress Notes (Signed)
Patient discharged to Inpatient  Rehab [place] [facility] via bed accompanied with all pt belongings.  Condition at discharge is stable.

## 2018-11-08 NOTE — Progress Notes (Signed)
Physical Therapy Treatment Patient Details Name: Denise [name]. Fadely MRN: [id number] DOB: 30-Nov-1945 Today's Date: 11/08/2018    History of Present Illness [place] Derry A. Karow is a 73 y.o. female past medical history of diabetes, hypertension, hyperlipidemia, colon cancer, COPD, chronic respiratory failure with hypoxia, systolic CHF with DOE admitted with sudden onset of left-sided weakness.  Found to have R cerebellar infarct s/p tPA w/ hemorrhagic transformation    PT Comments    Pt progressing steadily towards physical therapy goals, remains very motivated to participate. Requires min assist for transfers, ambulating without an assistive device at a moderate assist level for stability. Pt with dynamic imbalance, reaching for objects for single upper extremity support, displaying slower pace and instability with balance challenge. Also with continued LUE weakness and decreased coordination. SpO2 92% on 4L O2. Continue to recommend comprehensive inpatient rehab (CIR) for post-acute therapy needs.    Follow Up Recommendations  CIR     Equipment Recommendations  3in1 (PT)    Recommendations for Other Services       Precautions / Restrictions Precautions Precautions: Fall;Other (comment) Precaution Comments: watch O2 sats Restrictions Weight Bearing Restrictions: No    Mobility  Bed Mobility               General bed mobility comments: OOB in chair  Transfers Overall transfer level: Needs assistance Equipment used: Rolling walker (2 wheeled) Transfers: Sit to/from Stand Sit to Stand: Min assist         General transfer comment: MinA to rise and stabilize from recliner  Ambulation/Gait Ambulation/Gait assistance: Mod assist Gait Distance (Feet): 75 Feet(75',75') Assistive device: None Gait Pattern/deviations: Step-through pattern;Shuffle;Trunk flexed;Narrow base of support Gait velocity: decr   General Gait Details: ModA for stability without assistive  device; pt reaching consistently for external support in hallway. Needs slower pace and increased assist for balance activities i.e. head turns, stepping over obstacles, stops/starts   Stairs             Wheelchair Mobility    Modified Rankin (Stroke Patients Only) Modified Rankin (Stroke Patients Only) Pre-Morbid Rankin Score: Slight disability Modified Rankin: Moderately severe disability     Balance Overall balance assessment: Needs assistance Sitting-balance support: Feet supported Sitting balance-Leahy Scale: Fair     Standing balance support: Bilateral upper extremity supported;Single extremity supported;During functional activity Standing balance-Leahy Scale: Poor Standing balance comment: reliant on external support                            Cognition Arousal/Alertness: Awake/alert Behavior During Therapy: WFL for tasks assessed/performed Overall Cognitive Status: Impaired/Different from baseline Area of Impairment: Safety/judgement;Attention;Memory                   Current Attention Level: Sustained Memory: Decreased recall of precautions;Decreased short-term memory   Safety/Judgement: Decreased awareness of safety;Decreased awareness of deficits     General Comments: Pt with difficulty communicating needs. Perseverating on finding her "pink in the closet," while pointing to the computer. PT deduced she was looking for her denture case but was unable to find it; pt not comprehending that case was not in room.       Exercises      General Comments        Pertinent Vitals/Pain Pain Assessment: Faces Faces Pain Scale: No hurt    Home Living  Prior Function            PT Goals (current goals can now be found in the care plan section) Acute Rehab PT Goals Patient Stated Goal: go to rehab Potential to Achieve Goals: Good Progress towards PT goals: Progressing toward goals    Frequency    Min  4X/week      PT Plan Current plan remains appropriate    Co-evaluation              AM-PAC PT "6 Clicks" Mobility   Outcome Measure  Help needed turning from your back to your side while in a flat bed without using bedrails?: A Little Help needed moving from lying on your back to sitting on the side of a flat bed without using bedrails?: A Little Help needed moving to and from a bed to a chair (including a wheelchair)?: A Little Help needed standing up from a chair using your arms (e.g., wheelchair or bedside chair)?: A Little Help needed to walk in hospital room?: A Little Help needed climbing 3-5 steps with a railing? : A Lot 6 Click Score: 17    End of Session Equipment Utilized During Treatment: Gait belt;Oxygen Activity Tolerance: Patient tolerated treatment well Patient left: in chair;with call bell/phone within reach;with family/visitor present Nurse Communication: Mobility status PT Visit Diagnosis: Other abnormalities of gait and mobility (R26.89);Muscle weakness (generalized) (M62.81);Hemiplegia and hemiparesis     Time: 8088-1103 PT Time Calculation (min) (ACUTE ONLY): 20 min  Charges:  $Therapeutic Activity: 8-22 mins                     Ellamae Sia, PT, DPT Acute Rehabilitation Services Pager [phone] Office [phone]    Willy Eddy 11/08/2018, 11:17 AM

## 2018-11-08 NOTE — Discharge Instructions (Signed)
Care After Your Loop Recorder   You have a [employer] Loop Recorder    Monitor your cardiac device site for redness, swelling, and drainage. Call the device clinic at [phone] if you experience these symptoms or fever/chills.   You may shower 3 days after your loop recorder implant and wash your incision with soap and water. Avoid lotions, ointments, or perfumes over your incision until it is well-healed.   You may use a hot tub or a pool AFTER your wound check appointment if the incision is completely closed.   Your device is MRI compatible.    Remote monitoring is used to monitor your cardiac device from home. This monitoring is scheduled every month days by our office. It allows [place] to keep an eye on the functioning of your device to ensure it is working properly.

## 2018-11-08 NOTE — Progress Notes (Signed)
Pt in bed O2 2/L  on intact, vital signs stable, denies any pain, pt has difficulty with time but did well with place, and situation. Pt requested snack given graham cracker with peanut butter per request. Pt has dressing on left chest wall from loop recorder placement, dressing has old bloody drainage present, perimeter marked. Loop recorder plugged in and on.  [name] lungs clear diminished RR regular and easy SOB with exertion. Skin intact. There is a healed pressure ulcer on her right buttock. Pt states she is tired and ready for bed. Pt left with call light in place requested 4 SR up.

## 2018-11-08 NOTE — H&P (Signed)
Physical Medicine and Rehabilitation Admission H&P    CC: Stroke with functional decline.    HPI:  Denise [name] is a 73 year old female with history fo T2Dm, HTN, COPD with chronic hypoxic respiratory failure, colon cancer who was admitted on 11/04/18 with sudden onset of left sided weakness and slurred speech. Patient with recent admission 10/26-10/31/20 for COPD exacerbation with acute on chronic respiratory failure. CT head negative for bleed and she was treated with tPA. She developed severe HA with elevated BP later that evening with CT head showed right cerebellar infarct with hemorrhagic transformation and she received cryoprecipitate and TXA. Elevated BP treated with cleveprex.  MRI/MRA brain done revealing large right cerebellar hemorrhagic infarct with mass effect on posterior fossa and multiple additional small acute/subacute cortical infarcts in R-MCA territory with multiple small chronic hemorrhagic/non hemorrhargic cortical infarcts in bilateral cerebral hemispheres.  Carotid dopplers was negative for significant stenosis. 2 D echo showed EF 60-65% with severe LVH and grde one diastolic dysfunction.   Serial CT 11/5 showed new small to moderate left cerebellar infarct. Dr. Erlinda Hong felt that stroke could be due to high grade stenosis L-VA with relative low BP but likely embolic and loop recorder recommended to rule out embolic source. BLE dopplers negative for DVT. She was started on 325 mg ASA for secondary stroke prevention and loop placed 11/6 by Dr. Curt Bears. Therapy ongoing and patient limited by balance deficits with left sided weakness. CIR recommended due to functional decline.   She and her husband would like inpatient rehabilitation to help Denise [name] gain strength and improve her balance before she returns home. She denies any problems with sleep, pain, or constipation. She says her goal is to be able to pick things up better. She was able to walk with therapy today.  Her husband states that he and his wife are retired and that he will be home with her upon discharge.   Review of Systems  Constitutional: Negative for chills and fever.  HENT: Negative for hearing loss and tinnitus.   Eyes: Negative for blurred vision and double vision.  Respiratory: Positive for shortness of breath. Negative for cough and wheezing.   Cardiovascular: Negative for chest pain and palpitations.  Gastrointestinal: Negative for constipation, heartburn and nausea.  Genitourinary: Positive for dysuria, frequency and urgency.  Musculoskeletal: Negative for back pain, myalgias and neck pain.  Skin: Negative for rash.  Neurological: Positive for sensory change (left hand) and headaches (not as bad now). Negative for dizziness.  Psychiatric/Behavioral: The patient does not have insomnia.       Past Medical History:  Diagnosis Date  . Altered mental status   . Anxiety and depression   . Chronic respiratory failure with hypoxia ([place]) 10/02/2017   Assoc with ? Cor pulmonale dx 09/2017 so rx 02 1-2 lpm 24/7  - 10/17/2017  Walked RA x 3 laps @ 185 ft each stopped due to  End of study, nl pace,  desat to 87 on 3rd lap - 12/19/2017  Saturations on Room Air at Rest =91 % and while Ambulating = 87%  But  on  2 Liters of pulsed oxygen while Ambulating =93% so rec POC 2lpm walking / use at rest if sats under 90%      . Colon cancer ([employer]) 1988   Resected  . COPD (chronic obstructive pulmonary disease) ([place])   . Diabetes mellitus without complication ([name])    diet controlled- no meds. per pt  . Diarrhea 04/02/2018  .  Diverticulosis   . Hyperlipidemia   . Hypertension   . Hypoxia 10/30/2018  . Insomnia   . Primary hyperparathyroidism ([place])   . SBO (small bowel obstruction) ([place])   . Stroke ([place]) 10/2018   tpa administered  . Tubular adenoma of rectum 02/23/2013   low grade  . Vitamin D deficiency     Past Surgical History:  Procedure Laterality Date  . ABDOMINAL HYSTERECTOMY   08/2015  . BREAST BIOPSY Left   . BREAST EXCISIONAL BIOPSY Right   . COLON RESECTION  1980's  . COLONOSCOPY    . ENDOMETRIAL BIOPSY  2009   negative  . INCONTINENCE SURGERY  2017  . RIGHT HEART CATH N/A 09/20/2017   Procedure: RIGHT HEART CATH;  Surgeon: Larey Dresser, MD;  Location: [facility];  Service: Cardiovascular;  Laterality: N/A;  . RIGHT/LEFT HEART CATH AND CORONARY ANGIOGRAPHY N/A 03/19/2018   Procedure: RIGHT/LEFT HEART CATH AND CORONARY ANGIOGRAPHY;  Surgeon: Larey Dresser, MD;  Location: [facility];  Service: Cardiovascular;  Laterality: N/A;    Family History  Problem Relation Age of Onset  . Ovarian cancer Mother   . Breast cancer Neg Hx   . Hyperparathyroidism Neg Hx   . Colon cancer Neg Hx   . Esophageal cancer Neg Hx   . Stomach cancer Neg Hx     Social History:  Married. Used to work in housekeeping. She reports that she quit smoking about 31 years ago. Her smoking use included cigarettes. She has a 30.00 pack-year smoking history. She has never used smokeless tobacco. She reports that she does not drink alcohol or use drugs.    Allergies: No Known Allergies    Medications Prior to Admission  Medication Sig Dispense Refill  . albuterol (VENTOLIN HFA) 108 (90 Base) MCG/ACT inhaler INHALE 1-2 PUFFS INTO THE LUNGS EVERY 4 HOURS AS NEEDED FOR WHEEZING OR SHORTNESS OF BREATH. (Patient taking differently: Inhale 1-2 puffs into the lungs every 4 (four) hours as needed for wheezing or shortness of breath. INHALE 1-2 PUFFS INTO THE LUNGS EVERY 4 HOURS AS NEEDED FOR WHEEZING OR SHORTNESS OF BREATH.) 6.7 g 5  . aspirin EC 81 MG EC tablet Take 1 tablet (81 mg total) by mouth daily. 30 tablet 0  . atorvastatin (LIPITOR) 10 MG tablet TAKE 1 TABLET BY MOUTH EVERYDAY AT BEDTIME (Patient taking differently: Take 10 mg by mouth at bedtime. ) 90 tablet 3  . bisoprolol (ZEBETA) 5 MG tablet Take 2.5 mg by mouth daily.    . famotidine (PEPCID) 40 MG tablet TAKE 1  TABLET BY MOUTH TWICE A DAY (Patient taking differently: Take 40 mg by mouth 2 (two) times daily. ) 180 tablet 0  . furosemide (LASIX) 40 MG tablet Take 1 tablet (40 mg total) by mouth daily. 30 tablet 11  . isosorbide-hydrALAZINE (BIDIL) 20-37.5 MG tablet Take 2 tablets by mouth 3 (three) times daily. 180 tablet 3  . KLOR-CON M10 10 MEQ tablet TAKE 2 TABLETS (20 MEQ TOTAL) BY MOUTH DAILY. FOR LOW POTASSIUM. (Patient taking differently: Take 5 mEq by mouth daily. ) 60 tablet 3  . losartan (COZAAR) 100 MG tablet Take 50 mg by mouth daily.    . metoprolol succinate (TOPROL-XL) 25 MG 24 hr tablet Take 1 tablet (25 mg total) by mouth daily. 30 tablet 1  . mirtazapine (REMERON) 15 MG tablet TAKE 1 TABLET BY MOUTH AT BEDTIME. FOR DEPRESSION AND APPETITE. (Patient taking differently: Take 15 mg by mouth  at bedtime. ) 90 tablet 3  . OXYGEN Inhale 4 L into the lungs continuous.     . Polyethylene Glycol 3350 (MIRALAX PO) Take 17 g by mouth as needed.    [name] spironolactone (ALDACTONE) 25 MG tablet Take 1 tablet (25 mg total) by mouth daily. Take at night (Patient taking differently: Take 25 mg by mouth every evening. ) 30 tablet 6  . Blood Glucose Calibration (ACCU-CHEK AVIVA) SOLN USE AS INSTRUCTED TO TEST BLOOD SUGAR DAILY (Patient taking differently: 1 each by Other route daily. ) 1 each 2  . Blood Glucose Monitoring Suppl (ACCU-CHEK AVIVA PLUS) w/Device KIT Use as instructed to test blood sugar up to 3 times daily (Patient taking differently: 1 each by Other route 3 (three) times daily. ) 1 kit 0  . Fluticasone-Umeclidin-Vilant (TRELEGY ELLIPTA) 100-62.5-25 MCG/INH AEPB Inhale 1 puff into the lungs daily.    [name] glucose blood (ACCU-CHEK AVIVA PLUS) test strip Use as instructed to test blood sugar up to 3 times daily 300 each 1  . Lancets (ACCU-CHEK SOFT TOUCH) lancets Use as instructed to test blood sugar up to 3 times daily (Patient taking differently: 1 each by Other route 3 (three) times daily. ) 300 each 1     Drug Regimen Review  Drug regimen was reviewed and remains appropriate with no significant issues identified  Home: Home Living Family/patient expects to be discharged to:: Private residence Living Arrangements: Spouse/significant other Available Help at Discharge: Family, Available 24 hours/day Type of Home: House Home Access: Stairs to enter [employer] of Steps: 4 Entrance Stairs-Rails: Right Home Layout: One level, Laundry or work area in basement [employer] Shower/Tub: Public librarian, Architectural technologist: Standard Bathroom Accessibility: Yes Home Equipment: Environmental consultant - 2 wheels Additional Comments: home O2; home info taken with PT collateral who spoke with husband. Pt able to recall most information  Lives With: Spouse   Functional History: Prior Function Level of Independence: Independent Comments: reports not using walker  Functional Status:  Mobility: Bed Mobility Overal bed mobility: Needs Assistance Bed Mobility: Supine to Sit Supine to sit: Min guard General bed mobility comments: OOB in chair Transfers Overall transfer level: Needs assistance Equipment used: Rolling walker (2 wheeled) Transfers: Sit to/from Stand Sit to Stand: Min assist Stand pivot transfers: Min assist, Mod assist General transfer comment: MinA to rise and stabilize from recliner Ambulation/Gait Ambulation/Gait assistance: Mod assist Gait Distance (Feet): 75 Feet(75',75') Assistive device: None Gait Pattern/deviations: Step-through pattern, Shuffle, Trunk flexed, Narrow base of support General Gait Details: ModA for stability without assistive device; pt reaching consistently for external support in hallway. Needs slower pace and increased assist for balance activities i.e. head turns, stepping over obstacles, stops/starts Gait velocity: decr Gait velocity interpretation: <1.8 ft/sec, indicate of risk for recurrent falls    ADL: ADL Overall ADL's : Needs  assistance/impaired Eating/Feeding: Set up, Sitting Eating/Feeding Details (indicate cue type and reason): set up to open packaging and arrange tray for successful feeding. Pt able to manage soft foods with R hand utensils Grooming: Set up, Sitting Upper Body Bathing: Sitting, Moderate assistance Lower Body Bathing: Sit to/from stand, Sitting/lateral leans, Maximal assistance Upper Body Dressing : Moderate assistance, Sitting Lower Body Dressing: Maximal assistance, Sit to/from stand, Sitting/lateral leans Toilet Transfer: Minimal assistance, Moderate assistance, BSC, Stand-pivot Toilet Transfer Details (indicate cue type and reason): min A to rise, mod A for steadying with stand pivots- simulated with recliner Toileting- Clothing Manipulation and Hygiene: Moderate assistance, Sit to/from stand, Sitting/lateral lean Functional  mobility during ADLs: Minimal assistance, Moderate assistance General ADL Comments: pt limited by cognitive deficits, L sided weakness, and decreased activity tolerance  Cognition: Cognition Overall Cognitive Status: Impaired/Different from baseline Arousal/Alertness: Awake/alert Orientation Level: Oriented to person, Oriented to place, Disoriented to time, Disoriented to situation Attention: Selective Selective Attention: Appears intact Memory: Impaired Memory Impairment: Other (comment)(benefits from repetition of information) Awareness: Impaired Awareness Impairment: (intermittent recall of immediate plans) Cognition Arousal/Alertness: Awake/alert Behavior During Therapy: WFL for tasks assessed/performed Overall Cognitive Status: Impaired/Different from baseline Area of Impairment: Safety/judgement, Attention, Memory Orientation Level: Disoriented to, Situation Current Attention Level: Sustained Memory: Decreased recall of precautions, Decreased short-term memory Safety/Judgement: Decreased awareness of safety, Decreased awareness of deficits General  Comments: Pt with difficulty communicating needs. Perseverating on finding her "pink in the closet," while pointing to the computer. PT deduced she was looking for her denture case but was unable to find it; pt not comprehending that case was not in room.    Blood pressure (!) 132/91, pulse 84, temperature (!) 97.4 F (36.3 C), temperature source Oral, resp. rate (!) 32, height _0  (1.676 m), weight 44.9 kg, SpO2 100 %. Physical Exam  Nursing note and vitals reviewed. Constitutional: She is oriented to person, place, and time. She appears well-developed and well-nourished.  Respiratory: No stridor. No respiratory distress. She has no wheezes.  GI: She exhibits no distension. There is no abdominal tenderness.  Neurological: She is alert and oriented to person, place, and time.  Left facial weakness with mild dysarthria. Able to follow basic commands LUE weakness with ataxia    Results for orders placed or performed during the hospital encounter of 11/04/18 (from the past 48 hour(s))  Glucose, capillary     Status: None   Collection Time: 11/06/18 10:14 PM  Result Value Ref Range   Glucose-Capillary 93 70 - 99 mg/dL  Glucose, capillary     Status: None   Collection Time: 11/06/18 10:52 PM  Result Value Ref Range   Glucose-Capillary 95 70 - 99 mg/dL  CBC     Status: Abnormal   Collection Time: 11/07/18  5:04 AM  Result Value Ref Range   WBC 5.3 4.0 - 10.5 K/uL   RBC 4.57 3.87 - 5.11 MIL/uL   Hemoglobin 11.9 (L) 12.0 - 15.0 g/dL   HCT 37.7 36.0 - 46.0 %   MCV 82.5 80.0 - 100.0 fL   MCH 26.0 26.0 - 34.0 pg   MCHC 31.6 30.0 - 36.0 g/dL   RDW 14.8 11.5 - 15.5 %   Platelets 219 150 - 400 K/uL   nRBC 0.0 0.0 - 0.2 %    Comment: Performed at [facility], [place] [address] [place], [place] [address]  Basic metabolic panel     Status: Abnormal   Collection Time: 11/07/18  5:04 AM  Result Value Ref Range   Sodium 142 135 - 145 mmol/L   Potassium 3.0 (L) 3.5 - 5.1 mmol/L   Chloride  99 98 - 111 mmol/L   CO2 33 (H) 22 - 32 mmol/L   Glucose, Bld 128 (H) 70 - 99 mg/dL   BUN 22 8 - 23 mg/dL   Creatinine, Ser 1.17 (H) 0.44 - 1.00 mg/dL   Calcium 10.7 (H) 8.9 - 10.3 mg/dL   GFR calc non Af Amer 46 (L) >60 mL/min   GFR calc Af Amer 54 (L) >60 mL/min   Anion gap 10 5 - 15    Comment: Performed at [facility]  Lab, [address], [place], [place] [address]  Glucose, capillary     Status: Abnormal   Collection Time: 11/07/18  6:13 AM  Result Value Ref Range   Glucose-Capillary 111 (H) 70 - 99 mg/dL   Comment 1 Notify RN    Comment 2 Document in Chart   Glucose, capillary     Status: Abnormal   Collection Time: 11/07/18  3:41 PM  Result Value Ref Range   Glucose-Capillary 181 (H) 70 - 99 mg/dL  CBC     Status: Abnormal   Collection Time: 11/08/18  2:58 AM  Result Value Ref Range   WBC 4.8 4.0 - 10.5 K/uL   RBC 4.39 3.87 - 5.11 MIL/uL   Hemoglobin 11.3 (L) 12.0 - 15.0 g/dL   HCT 35.9 (L) 36.0 - 46.0 %   MCV 81.8 80.0 - 100.0 fL   MCH 25.7 (L) 26.0 - 34.0 pg   MCHC 31.5 30.0 - 36.0 g/dL   RDW 14.7 11.5 - 15.5 %   Platelets 211 150 - 400 K/uL   nRBC 0.0 0.0 - 0.2 %    Comment: Performed at [facility], [place] [address], [place], [place] [address]  Basic metabolic panel     Status: Abnormal   Collection Time: 11/08/18  2:58 AM  Result Value Ref Range   Sodium 141 135 - 145 mmol/L   Potassium 3.2 (L) 3.5 - 5.1 mmol/L   Chloride 100 98 - 111 mmol/L   CO2 33 (H) 22 - 32 mmol/L   Glucose, Bld 177 (H) 70 - 99 mg/dL   BUN 22 8 - 23 mg/dL   Creatinine, Ser 1.15 (H) 0.44 - 1.00 mg/dL   Calcium 10.4 (H) 8.9 - 10.3 mg/dL   GFR calc non Af Amer 47 (L) >60 mL/min   GFR calc Af Amer 55 (L) >60 mL/min   Anion gap 8 5 - 15    Comment: Performed at [facility] [address], [place], [place] [address]  Glucose, capillary     Status: Abnormal   Collection Time: 11/08/18  6:09 AM  Result Value Ref Range   Glucose-Capillary 118 (H) 70 - 99 mg/dL   Comment 1  Notify RN   Glucose, capillary     Status: Abnormal   Collection Time: 11/08/18  7:54 AM  Result Value Ref Range   Glucose-Capillary 111 (H) 70 - 99 mg/dL  Glucose, capillary     Status: Abnormal   Collection Time: 11/08/18 12:14 PM  Result Value Ref Range   Glucose-Capillary 173 (H) 70 - 99 mg/dL  Glucose, capillary     Status: Abnormal   Collection Time: 11/08/18  4:09 PM  Result Value Ref Range   Glucose-Capillary 176 (H) 70 - 99 mg/dL   Ct Head Wo Contrast  Result Date: 11/07/2018 CLINICAL DATA:  Stroke follow-up EXAM: CT HEAD WITHOUT CONTRAST TECHNIQUE: Contiguous axial images were obtained from the base of the skull through the vertex without intravenous contrast. COMPARISON:  CT and brain MRI from 2 days ago FINDINGS: Brain: Unchanged area of hemorrhage in the inferior right cerebellum with regional edema. Measurement is confounded by mixed density from brain parenchyma intervening blood. Local mass effect with fourth ventricular narrowing is stable. New small to moderate superficial left inferior cerebellar infarct. Small cortical infarcts along the right cerebral convexity that are known from comparison MRI. Nonacute small left superior frontal cortex infarct. No hydrocephalus or extra-axial collection. Vascular: Atherosclerosis.  No hyperdense vessel.  Skull: Negative Sinuses/Orbits: Negative These results were communicated to at 9:41 amon 11/5/2020by text page via the [facility] messaging system. IMPRESSION: 1. New small to moderate infarct in the lateral left cerebellum. 2. Unchanged appearance of hemorrhagic infarct with swelling in the inferior right cerebellum. No hydrocephalus. 3. Unchanged appearance of nonhemorrhagic small cortical infarcts along the right cerebral convexity. Electronically Signed   By: Monte Fantasia M.D.   On: 11/07/2018 09:42       Medical Problem List and Plan: 1.  Impaired fait and mobility secondary to acute ischemic stroke 2.   Antithrombotics: -DVT/anticoagulation:  Mechanical: Sequential compression devices, below knee Bilateral lower extremities BLE dopplers negative on 11/4  -antiplatelet therapy: ASA 325 mg daily 3. Headaches/Pain Management: Tylenol prn 4. Mood: LCSW to follow for evaluation and support. She has a history of anxiety and depression.  -antipsychotic agents: N/A 5. Neuropsych: This patient is capable of making decisions on her own behalf. 6. Skin/Wound Care: Keep incision C/D for 3 days. Monitor wound for healing.  7. Fluids/Electrolytes/Nutrition: Maintain adequate nutritional and hydration status. She has severe protein calorie nutrition and is currently on Mirtazepine 57m HS to stimulate appetite.  8. HTN: Monitor BP tid and avoid hypotension due to high grade L-VA stenosis. BP goals <160 due to cerebellar bleed. Currently on Lasix, aldactone and Imdur--metoprolol and Losartan on hold at this time.  9. T2DM: Hgb A1c- 7.0. Monitor BS ac/hs with SSI for now.  Was on glucotrol 2.5 mg daily prior to last admission.   10. COPD:  4 liters Oxygen dependent.  Respiratory status stable on Incruse and Breo daily.  11. Chronic systolic CHF: Monitor for signs of overload and check weight daily. Continue spironolactone, Imdur, Lipitor, ASA and lasix daily.   12. Hypokalemia: Will likely need daily supplement as still low. Will add kdur bid--check labs in am.  13. ABLA: Recheck CBC in am.   14. CKD?: BUN/SCr - 31/1.36 at admission likely due to recent diuresis. Continue to monitor with serial checks.  15. Dysuria with incontinence:  Will check UA.[facility]   PBary Leriche PA-C 11/08/2018   I have personally performed a face to face diagnostic evaluation, including, but not limited to relevant history and physical exam findings, of this patient and developed relevant assessment and plan.  Additionally, I have reviewed and concur with the physician assistant's documentation above.  KLeeroy Cha MD

## 2018-11-08 NOTE — Progress Notes (Signed)
Pt transported off unit to Cath lab for procedure. Francis Gaines Kufffour RN.

## 2018-11-08 NOTE — Discharge Summary (Addendum)
Stroke Discharge Summary  Patient ID: Denise [name]   MRN: [id number]      DOB: 1945/08/18  Date of Admission: 11/04/2018 Date of Discharge: 11/08/2018  Attending Physician:  Rosalin Hawking, MD, Stroke MD Consultant(s):   Will Curt Bears, MD (electrophysiology), Leeroy Cha, MD (Physical Medicine & Rehabtilitation)   Patient's PCP:  [name], NP  Discharge Diagnoses:  Principal Problem:   Acute ischemic stroke [facility])  Active Problems:   COPD GOLD  II     Anxiety and depression   Hyperlipidemia   Essential hypertension   Protein-calorie malnutrition, severe   Chronic respiratory failure with hypoxia and hypercapnia ([name])   On home oxygen therapy   Acute on chronic systolic CHF (congestive heart failure) ([place])   Diabetes mellitus without complication-diet controled   ICH (intracerebral hemorrhage) ([place]) s/p tPA administration   Hypertensive urgency  Past Medical History:  Diagnosis Date  . Altered mental status   . Anxiety and depression   . Chronic respiratory failure with hypoxia ([place]) 10/02/2017   Assoc with ? Cor pulmonale dx 09/2017 so rx 02 1-2 lpm 24/7  - 10/17/2017  Walked RA x 3 laps @ 185 ft each stopped due to  End of study, nl pace,  desat to 87 on 3rd lap - 12/19/2017  Saturations on Room Air at Rest =91 % and while Ambulating = 87%  But  on  2 Liters of pulsed oxygen while Ambulating =93% so rec POC 2lpm walking / use at rest if sats under 90%      . Colon cancer ([place]) 1988   Resected  . COPD (chronic obstructive pulmonary disease) ([place])   . Diabetes mellitus without complication ([name])    diet controlled- no meds. per pt  . Diarrhea 04/02/2018  . Diverticulosis   . Hyperlipidemia   . Hypertension   . Hypoxia 10/30/2018  . Insomnia   . Primary hyperparathyroidism ([place])   . SBO (small bowel obstruction) ([place])   . Stroke ([place]) 10/2018   tpa administered  . Tubular adenoma of rectum 02/23/2013   low grade  . Vitamin D deficiency    Past  Surgical History:  Procedure Laterality Date  . ABDOMINAL HYSTERECTOMY  08/2015  . BREAST BIOPSY Left   . BREAST EXCISIONAL BIOPSY Right   . COLON RESECTION  1980's  . COLONOSCOPY    . ENDOMETRIAL BIOPSY  2009   negative  . INCONTINENCE SURGERY  2017  . RIGHT HEART CATH N/A 09/20/2017   Procedure: RIGHT HEART CATH;  Surgeon: Larey Dresser, MD;  Location: [place] [facility];  Service: Cardiovascular;  Laterality: N/A;  . RIGHT/LEFT HEART CATH AND CORONARY ANGIOGRAPHY N/A 03/19/2018   Procedure: RIGHT/LEFT HEART CATH AND CORONARY ANGIOGRAPHY;  Surgeon: Larey Dresser, MD;  Location: [place] [facility];  Service: Cardiovascular;  Laterality: N/A;    Medications to be continued on Rehab Allergies as of 11/08/2018   No Known Allergies     Medication List    STOP taking these medications   BiDil 20-37.5 MG tablet Generic drug: isosorbide-hydrALAZINE   losartan 100 MG tablet Commonly known as: COZAAR   metoprolol succinate 25 MG 24 hr tablet Commonly known as: TOPROL-XL   MIRALAX PO     TAKE these medications   Accu-Chek Aviva Plus w/Device Kit Use as instructed to test blood sugar up to 3 times daily What changed:   how much to take  how to take this  when to  take this  additional instructions   Accu-Chek Aviva Soln USE AS INSTRUCTED TO TEST BLOOD SUGAR DAILY What changed:   how much to take  how to take this  when to take this  additional instructions   accu-chek soft touch lancets Use as instructed to test blood sugar up to 3 times daily What changed:   how much to take  how to take this  when to take this  additional instructions   albuterol 108 (90 Base) MCG/ACT inhaler Commonly known as: VENTOLIN HFA INHALE 1-2 PUFFS INTO THE LUNGS EVERY 4 HOURS AS NEEDED FOR WHEEZING OR SHORTNESS OF BREATH. What changed:   how much to take  how to take this  when to take this  reasons to take this   aspirin 325 MG EC tablet Take 1 tablet (325 mg  total) by mouth daily. Start taking on: November 09, 2018 What changed:   medication strength  how much to take   atorvastatin 20 MG tablet Commonly known as: LIPITOR Take 1 tablet (20 mg total) by mouth at bedtime. What changed:   medication strength  See the new instructions.   bisoprolol 5 MG tablet Commonly known as: ZEBETA Take 2.5 mg by mouth daily.   Chlorhexidine Gluconate Cloth 2 % Pads Apply 6 each topically daily. Start taking on: November 09, 2018   famotidine 20 MG tablet Commonly known as: PEPCID Take 1 tablet (20 mg total) by mouth daily. Start taking on: November 09, 2018 What changed:   medication strength  how much to take  when to take this   furosemide 40 MG tablet Commonly known as: Lasix Take 1 tablet (40 mg total) by mouth daily.   glucose blood test strip Commonly known as: Accu-Chek Aviva Plus Use as instructed to test blood sugar up to 3 times daily   insulin aspart 100 UNIT/ML injection Commonly known as: novoLOG Inject 0-15 Units into the skin 3 (three) times daily with meals.   insulin aspart 100 UNIT/ML injection Commonly known as: novoLOG Inject 0-5 Units into the skin at bedtime.   mirtazapine 15 MG tablet Commonly known as: REMERON TAKE 1 TABLET BY MOUTH AT BEDTIME. FOR DEPRESSION AND APPETITE. What changed:   how much to take  how to take this  when to take this  additional instructions   mouth rinse Liqd solution 15 mLs by Mouth Rinse route 2 (two) times daily.   OXYGEN Inhale 4 L into the lungs continuous.   potassium chloride 10 MEQ tablet Commonly known as: Klor-Con M10 Take 0.5 tablets (5 mEq total) by mouth daily. What changed: See the new instructions.   senna-docusate 8.6-50 MG tablet Commonly known as: Senokot-S Take 1 tablet by mouth at bedtime as needed for mild constipation.   sodium chloride 0.9 % infusion Inject 30 mLs into the vein continuous.   spironolactone 25 MG tablet Commonly known  as: ALDACTONE Take 1 tablet (25 mg total) by mouth every evening.   Trelegy Ellipta 100-62.5-25 MCG/INH Aepb Generic drug: Fluticasone-Umeclidin-Vilant Inhale 1 puff into the lungs daily.       LABORATORY STUDIES CBC    Component Value Date/Time   WBC 4.8 11/08/2018 0258   RBC 4.39 11/08/2018 0258   HGB 11.3 (L) 11/08/2018 0258   HCT 35.9 (L) 11/08/2018 0258   PLT 211 11/08/2018 0258   MCV 81.8 11/08/2018 0258   MCH 25.7 (L) 11/08/2018 0258   MCHC 31.5 11/08/2018 0258   RDW 14.7 11/08/2018 0258  LYMPHSABS 1.3 11/04/2018 1500   MONOABS 0.6 11/04/2018 1500   EOSABS 0.1 11/04/2018 1500   BASOSABS 0.0 11/04/2018 1500   CMP    Component Value Date/Time   NA 141 11/08/2018 0258   K 3.2 (L) 11/08/2018 0258   CL 100 11/08/2018 0258   CO2 33 (H) 11/08/2018 0258   GLUCOSE 177 (H) 11/08/2018 0258   BUN 22 11/08/2018 0258   CREATININE 1.15 (H) 11/08/2018 0258   CREATININE 0.84 09/28/2017 1559   CALCIUM 10.4 (H) 11/08/2018 0258   PROT 7.8 11/04/2018 1500   ALBUMIN 4.5 11/04/2018 1500   AST 32 11/04/2018 1500   ALT 35 11/04/2018 1500   ALKPHOS 105 11/04/2018 1500   BILITOT 0.8 11/04/2018 1500   GFRNONAA 47 (L) 11/08/2018 0258   GFRAA 55 (L) 11/08/2018 0258   COAGS Lab Results  Component Value Date   INR 1.1 11/04/2018   INR 1.1 10/28/2018   Lipid Panel    Component Value Date/Time   CHOL 170 11/05/2018 0602   TRIG 85 11/05/2018 0602   HDL 62 11/05/2018 0602   CHOLHDL 2.7 11/05/2018 0602   VLDL 17 11/05/2018 0602   LDLCALC 91 11/05/2018 0602   HgbA1C  Lab Results  Component Value Date   HGBA1C 7.0 (H) 11/05/2018    SIGNIFICANT DIAGNOSTIC STUDIES Ct Head Wo Contrast 11/07/2018 1. New small to moderate infarct in the lateral left cerebellum. 2. Unchanged appearance of hemorrhagic infarct with swelling in the inferior right cerebellum. No hydrocephalus. 3. Unchanged appearance of nonhemorrhagic small cortical infarcts along the right cerebral convexity.    Mr  Brain Wo Contrast 11/05/2018 1738 1. Large acute/subacute hemorrhagic right cerebellar infarct not significantly changed from head CT earlier the same day. Posterior fossa mass effect is also similar with partial effacement of the fourth ventricle. No hydrocephalus 2. Multiple additional small acute/subacute cortical infarcts within the right MCA vascular territory, some of which demonstrate subtle petechial hemorrhage. 3. Multiple small chronic hemorrhagic and nonhemorrhagic cortical infarcts within the bilateral cerebral hemispheres. 4. Small chronic lacunar infarcts within the right thalamus and left cerebellum. Background of mild chronic small vessel ischemic disease.   Mr Angio Head Wo Contrast 11/05/2018 1. No intracranial large vessel occlusion. 2. Intracranial atherosclerotic disease. Most notably, there are sites of up to moderate stenosis within the non dominant left left vertebral artery. Additional mild stenoses as described.   Ct Head Wo Contrast 11/05/2018 0311 1. No significant interval change in size of hemorrhagic right cerebellar infarct with similar mass regional mass effect. No hydrocephalus or ventricular trapping. 2. No other new acute intracranial abnormality.   Ct Head Wo Contrast 11/04/2018 1953 1. Acute to subacute right cerebellar infarct with interval hemorrhagic transformation. Dominant intraparenchymal hemorrhage measures approximately 3.2 x 3.0 x 1.6 cm (estimated volume 8 CC). Associated regional mass effect throughout the right posterior fossa without obstructive hydrocephalus. 2. No other new acute intracranial abnormality.    Ct Head Code Stroke Wo Contrast 11/04/2018 1435 1. No acute intracranial hemorrhage. Suspected age-indeterminate right cerebellar infarction. 2. ASPECTS is 10 3. Chronic microvascular ischemic changes. Small chronic left frontal infarct.   Vas US Carotid 11/05/2018 Right Carotid: Velocities in the right ICA are consistent with a 1-39%  stenosis.  Left Carotid: Velocities in the left ICA are consistent with a 1-39% stenosis. Vertebrals:  Bilateral vertebral arteries demonstrate antegrade flow. Subclavians: Normal flow hemodynamics were seen in bilateral subclavian arteries.   2D Echocardiogram 1. Left ventricular ejection fraction, by visual estimation, is  60 to 65%. The left ventricle has normal function. There is severely increased left ventricular hypertrophy. 2. Left ventricular diastolic parameters are consistent with Grade I diastolic dysfunction (impaired relaxation). 3. Global right ventricle has moderately reduced systolic function.The right ventricular size is normal. 4. Left atrial size was mildly dilated. 5. Right atrial size was normal. 6. Trivial pericardial effusion is present. 7. The mitral valve is abnormal. Trace mitral valve regurgitation. No evidence of mitral stenosis. 8. The tricuspid valve is normal in structure. Tricuspid valve regurgitation is mild. 9. The aortic valve is tricuspid. Aortic valve regurgitation is trivial. Mild aortic valve sclerosis without stenosis. 10. The pulmonic valve was normal in structure. Pulmonic valve regurgitation is not visualized. 11. Moderately elevated pulmonary artery systolic pressure. 12. The inferior vena cava is normal in size with greater than 50% respiratory variability, suggesting right atrial pressure of 3 mmHg. 13. Normal LV systolic function; grade 1 diastolic dysfunction; severe LVH with speckled appearance; consider amyloid; mild LAE; moderate RV dysfunction; trace AI.    HISTORY OF PRESENT ILLNESS  Denise [name] is a 73 y.o. female past medical history of diabetes, hypertension, hyperlipidemia, colon cancer, COPD, chronic respiratory failure with hypoxia, systolic CHF with DOE, last known well at 12:45 PM today 11/04/2018 at lunch when she had a sudden onset of left-sided facial weakness and left arm weakness. She was having lunch with  family, suddenly was noted to have left lower facial weakness, slurred speech and inability to use her left hand to pull her mask over her glasses. EMS was called.  They confirmed the left-sided deficits.  The activated code stroke in the field.  Her systolic blood pressures were 130s at the time.   She was recently in the hospital and treated for chronic respiratory failure with hypoxia and hypercapnia thought to be related to acute on chronic hypoxic respiratory failure, likely COPD exacerbation with diastolic heart failure exacerbation. No recent surgeries.  No bleeding tendencies.  No history of strokes in the past. Reports ongoing shortness of breath but no other new symptoms other than these left-sided symptoms of weakness.  tpa given?:  Yes Premorbid modified Rankin scale (mRS): Between 3 and [place] [name] is a 73 y.o. female with history of diabetes, hypertension, hyperlipidemia, colon cancer,COPD,chronic respiratory failure with hypoxia, systolic CHF with DOE presenting with slurred speech and left sided weakness. Recently hospitalized for acute on chronic respiratory failure w/ hypoxia and hypercapnia, likely COPD exacerbation and diastolic HF. Has ongoing SOB. Received IV tPA 11/04/2018 at 1420.   1. Stroke: new interval left cerebellar infarct, could be due to high grade tandem stenosis left VA in the setting of relative low BP  2. Stroke:   R cerebellar and R MCA infarcts s/p tPA w/ R cerebellar hemorrhagic transformation with new L cerebellar infarct in hospital, infarcts likely embolic secondary to unknown source  Code Stroke CT head No acute abnormality. Old L frontal lobe infarct. Small vessel disease.   CT head R cerebellar infarct w/ interval hemorrhagic transformation 8cc w/ mass effect.  CT head repeat no significant change   MRI  Large R cerebellar infarct w/ posterior mass effect. Small cortical infarcts R MCA some with petechial  hemorrhage. Multiple old small hemorrhagic and non-hemorrhagic B cortical infarcts. Old R thalamic and L cerebellar lacunes.   MRA  No LVO. Intracranial atherosclerosis (most significant is moderate L VA tandem stenosis)  Repeat CT 11/5 new small to moderate lateral L cerebellar infarct.  Unchanged R cerebellar infarct w/ hemorrhagic transformation. Unchanged small R cerebral convexity cortical infarcts  Carotid Doppler  B ICA 1-39% stenosis, VAs antegrade   2D Echo EF 60-65%. No source of embolus. Consider amyloidosis   LE venous doppler negative for DVT  loop recorder placed 11/6  LDL 91  HgbA1c 7.0  SCDs for VTE prophylaxis  aspirin 81 mg daily prior to admission, on aspirin 325 mg daily.   Therapy recommendations:  CIR  Disposition:  CIR  Post tPA hemorrhagic conversion  HA with elevated BP, BP up to > 200 after tPA  [facility] w/ hemorrhagic transformation site of infarct   Fibrinogen 160  Received TXA  Repeat CT stable hematoma  MRI and MRA stable hemorrhage. No LVO. See above  CT repeat with stable hemorrhage   Put on ASA 325  Hypertensive Urgency  On Cleviprex  Home meds:  isosorbide-HCTZ 20-37.5, lasix 40, losartan 50, metoprolol XR 25, spironolactone 25  BP goal < 160 given stable hemorrhagic transformation   Home meds resumed with lasix and spironolactone only   BP stable now  Long-term BP goal normotensive  Avoid low BP give left VA high grade stenosis  Hyperlipidemia  Home meds:  lipitor 10  LDL 91, goal < 70  Increase lipitor to 20. Will not do higher intensity state d/t advanced age and LDL almost at goal  Continue statin at discharge  Diabetes type II Controlled  Home meds:  None listed  HgbA1c 7.0, goal < 7.0  CBGs  SSI  Close PCP follow up  Other Stroke Risk Factors  Advanced age  Former Cigarette smoker, quit 31 yrs ago  Diastolic Congestive heart failure  Other Active Problems  Chronic respiratory failure w/  hypoxia  COPD - remains on O2 at 4L - wean as able  Anxiety and depression  Hx colon cancer  Primary hyperparathyroidism   CKD stage III, Cre 1.12->1.36->1.21->0.99->1.17->1.15  Severe malnutrition d/t chronic illness, Body mass index is 15.98 kg/m.   Hypokalemia 3.5->3.0->3.2  Severe malnutrition d/t chronic illness (COPD, CHF) with severe fat and muscle depletion. Body mass index is 15.98 kg/m.   DISCHARGE EXAM Blood pressure (!) 132/91, pulse 84, temperature (!) 97.4 F (36.3 C), temperature source Oral, resp. rate (!) 32, height _0  (1.676 m), weight 44.9 kg, SpO2 100 %. General - Well nourished, well developed, in no apparent distress.  Ophthalmologic - fundi not visualized due to noncooperation.  Cardiovascular - Regular rhythm and rate.  Mental Status -  Level of arousal and orientation to time, place, and person were intact. Language including expression, naming, repetition, comprehension was assessed and found intact. Mild dysarthria  Cranial Nerves II - XII - II - Visual field intact OU. III, IV, VI - Extraocular movements intact. V - Facial sensation intact bilaterally. VII - left facial droop. VIII - Hearing & vestibular intact bilaterally. X - Palate elevates symmetrically, mild dysarthria. XI - Chin turning & shoulder shrug intact bilaterally. XII - Tongue protrusion intact.  Motor Strength - The patient's strength was normal in all extremities except left bicep 4/5, left wrist extension 4-/5 and left hand grip 3+/5 and pronator drift was absent.  Bulk was normal and fasciculations were absent.   Motor Tone - Muscle tone was assessed at the neck and appendages and was normal.  Reflexes - The patient's reflexes were symmetrical in all extremities and she had no pathological reflexes.  Sensory - Light touch, temperature/pinprick were assessed and were symmetrical.    Coordination -  The patient had normal movements in the right hand and left leg  with no ataxia or dysmetria.  Tremor was absent.  Gait and Station - deferred.  Discharge Diet  Heart healthy/carb modified thin liquids  DISCHARGE PLAN  Disposition:  Transfer to [place] for ongoing PT, OT and ST  aspirin 325 mg daily for secondary stroke prevention  Recommend ongoing stroke risk factor control by Primary Care Physician at time of discharge from inpatient rehabilitation.  Follow-up [name], NP in 2 weeks following discharge from rehab.  Follow-up in [facility] in 4 weeks following discharge from rehab, office to schedule an appointment.  Follow-up [facility] for implantable loop recorder wound check in 10-14 days, office to schedule an appointment.  Loop recorder to be monitored by [facility] for atrial fibrillation as source of stroke. If found, they will notify patient.  35 minutes were spent preparing discharge.  Rosalin Hawking, MD PhD Stroke Neurology 11/08/2018 7:26 PM

## 2018-11-08 NOTE — Progress Notes (Signed)
Pt back to room from procedure; VSS; loop incision site dsg clean, dry and intact with no stain or active bleeding noted. Will continue to closely monitor. Pt in bed with call light within reach and spouse remains at bedside. Delia Heady RN   11/08/18 1536  Vitals  Temp 98.1 F (36.7 C)  Temp Source Oral  BP 127/73  MAP (mmHg) 89  BP Location Left Arm  BP Method Automatic  Patient Position (if appropriate) Lying  Pulse Rate 71  Pulse Rate Source Monitor  Resp 18  Oxygen Therapy  SpO2 100 %  O2 Device Nasal Cannula  O2 Flow Rate (L/min) 4 L/min  MEWS Score  MEWS RR 0  MEWS Pulse 0  MEWS Systolic 0  MEWS LOC 0  MEWS Temp 0  MEWS Score 0  MEWS Score Color Nyoka Cowden

## 2018-11-08 NOTE — Progress Notes (Deleted)
Izora Ribas, MD  Physician  Physical Medicine and Rehabilitation  PMR Pre-admission  Addendum  Date of Service:  11/07/2018 1:50 PM      Related encounter: ED to Hosp-Admission (Current) from 11/04/2018 in [facility]         Show:Clear all _0 Manual_1 Template_2 Copied  Added by: _3 Cristina Gong, RN  _4 Hover for details PMR Admission Coordinator Pre-Admission Assessment  Denise [name]: Denise [name] is an 73 y.o., female MRN: [id number] DOB: 10/17/1945 Height: _5  (167.6 cm) Weight: 44.9 kg                                                                                                                                                  Insurance Information HMO: yes    PPO:      PCP:     IPA:      80/20:      OTHER: medicare advantage PRIMARY: [employer]      Policy#: [id number]      Subscriber: Denise [name] CM Name:  [name]     Phone#: [phone] ext [phone]     Fax#: [phone] Pre-Cert#: [id number]  Approved for 7 days with f/u with [facility] ext [phone] fax [phone]    Employer:  Benefits:  Phone #: [phone]     Name: 11/5 Eff. Date: 02/02/2018     Deduct: none      Out of Pocket Max: $3400      Life Max: none CIR: $295 co pay per day days 1 until 6      SNF: no copay days 1 until 20; $178 co pay per day days 21 until 100 Outpatient: $10 to $40 co py per visit     Co-Pay: visits per medical neccesity Home Health: 100%      Co-Pay: visits per medical neceesity DME: 805     Co-Pay: 205 Providers: in network  SECONDARY: none      Medicaid Application Date:       Case Manager:  Disability Application Date:       Case Worker:   The "Data Collection Information Summary" for patients in Inpatient Rehabilitation Facilities with attached "Privacy Act Independence Records" was provided and verbally reviewed with: Denise [name] and Family  Emergency Contact Information         Contact Information    Name Relation Home Work Mobile    [place] Sister [phone]  [phone]   Roehrs,[name] Spouse [phone]       Current Medical History  Denise [name] Admitting Diagnosis: right cerebellar CVA   History of Present Illness: 73 year old female with past history of diabetes, HTN, hyperlipidemia, colon cancer, COPD,pulmonary HTN, chronic respiratory failure with hypoxia, hyperparathyroidism and systolic CHF with DOE. Recent admission to hospital for respiratory failure.Presented on 11/04/2018 with sudden onset of left sided facial weakness and left arm weakness. EMS  activated.  Denise [name] treated with TPA.   CT head revealed right cerebellar infarct s/p TPA with hemorrhagic transformation 8 cc with mass effect. Denise [name] given fibrinogen, and TXA for TPA reversal. Infarct likely embolic secondary to unknown source. MRI showed right cerebellar HT with right MCA small scattered infarcts. MRA unremarkable. Carotid doppler B ICA 1-39% stenosis, VAs antegrade. 2 d echo EF 60 to 65 %. No source embolus. Consider amyloidosis. LE venous dopplers negative for DVT. Will plan LOOP recorder placement 11/6 to rule out Afib. SCDs for VTE prophylaxis. ASA 81 mg daily prior to admit. Now on no antithrombotic due to HT. Repeat CT 11/5 showed stable HT right cerebellar CVA, but new infarct at left cerebellar.To satrt ada 325 mg daily.  HTN urgency treated with cleviprex initially. Home meds of Isosorbide HCTZ, lasix, losartan, metoprolol and spironolactone. BP goals <140 given hemorrhagic transformation.  Avoid low BP given left VA high grade stenosis. Home meds resumed except for losartan.Lipitor increased to 20 with LDL 91. To continue statin at discharge. Hgb A1c 7 with no meds pta. CBGS and SSI with close PCP follow up as outpatient. Severe malnutrition due to chronic illness.   Old left frontal lobe infarct noted.  Complete NIHSS TOTAL: 2 Glasgow Coma Scale Score: 15  Past Medical History  Past Medical History:  Diagnosis Date   . Altered mental status   . Anxiety and depression   . Chronic respiratory failure with hypoxia ([place]) 10/02/2017   Assoc with ? Cor pulmonale dx 09/2017 so rx 02 1-2 lpm 24/7  - 10/17/2017  Walked RA x 3 laps @ 185 ft each stopped due to  End of study, nl pace,  desat to 87 on 3rd lap - 12/19/2017  Saturations on Room Air at Rest =91 % and while Ambulating = 87%  But  on  2 Liters of pulsed oxygen while Ambulating =93% so rec POC 2lpm walking / use at rest if sats under 90%      . Colon cancer ([place]) 1988   Resected  . COPD (chronic obstructive pulmonary disease) ([place])   . Diabetes mellitus without complication ([name])    diet controlled- no meds. per Denise [name]  . Diarrhea 04/02/2018  . Diverticulosis   . Hyperlipidemia   . Hypertension   . Hypoxia 10/30/2018  . Insomnia   . Primary hyperparathyroidism ([place])   . SBO (small bowel obstruction) ([place])   . Stroke ([place]) 10/2018   tpa administered  . Tubular adenoma of rectum 02/23/2013   low grade  . Vitamin D deficiency     Family History  family history includes Ovarian cancer in her mother.  Prior Rehab/Hospitalizations:  Has the Denise [name] had prior rehab or hospitalizations prior to admission? Yes  Has the Denise [name] had major surgery during 100 days prior to admission? No  Current Medications   Current Facility-Administered Medications:  .   stroke: mapping our early stages of recovery book, , Does not apply, Once, Amie Portland, MD .  0.9 %  sodium chloride infusion, , Intravenous, Continuous, Rosalin Hawking, MD, Stopped at 11/08/18 [phone] .  acetaminophen (TYLENOL) tablet 650 mg, 650 mg, Oral, Q4H PRN, 650 mg at 11/07/18 2211 **OR** acetaminophen (TYLENOL) 160 MG/5ML solution 650 mg, 650 mg, Per Tube, Q4H PRN **OR** acetaminophen (TYLENOL) suppository 650 mg, 650 mg, Rectal, Q4H PRN, Amie Portland, MD .  albuterol (VENTOLIN HFA) 108 (90 Base) MCG/ACT inhaler 1-2 puff, 1-2 puff, Inhalation, Q4H PRN, Amie Portland, MD .  aspirin  EC tablet  325 mg, 325 mg, Oral, Daily, Rosalin Hawking, MD, 325 mg at 11/08/18 8295 .  atorvastatin (LIPITOR) tablet 20 mg, 20 mg, Oral, QHS, Rosalin Hawking, MD, 20 mg at 11/07/18 2211 .  Chlorhexidine Gluconate Cloth 2 % PADS 6 each, 6 each, Topical, Daily, Rosalin Hawking, MD, 6 each at 11/08/18 [phone] .  labetalol (NORMODYNE) injection 20 mg, 20 mg, Intravenous, Once **AND** clevidipine (CLEVIPREX) infusion 0.5 mg/mL, 0-21 mg/hr, Intravenous, Continuous, Amie Portland, MD, Stopped at 11/08/18 0430 .  famotidine (PEPCID) tablet 20 mg, 20 mg, Oral, Daily, Rosalin Hawking, MD, 20 mg at 11/08/18 0834 .  fluticasone furoate-vilanterol (BREO ELLIPTA) 100-25 MCG/INH 1 puff, 1 puff, Inhalation, Daily, 1 puff at 11/08/18 0732 **AND** umeclidinium bromide (INCRUSE ELLIPTA) 62.5 MCG/INH 1 puff, 1 puff, Inhalation, Daily, Amie Portland, MD, 1 puff at 11/08/18 0732 .  [START ON 11/09/2018] furosemide (LASIX) tablet 40 mg, 40 mg, Oral, Daily, Rosalin Hawking, MD .  hydrALAZINE (APRESOLINE) injection 5-10 mg, 5-10 mg, Intravenous, Q4H PRN, Rosalin Hawking, MD .  insulin aspart (novoLOG) injection 0-15 Units, 0-15 Units, Subcutaneous, TID WC, Rosalin Hawking, MD, 3 Units at 11/08/18 1429 .  insulin aspart (novoLOG) injection 0-5 Units, 0-5 Units, Subcutaneous, QHS, Rosalin Hawking, MD .  MEDLINE mouth rinse, 15 mL, Mouth Rinse, BID, Amie Portland, MD, 15 mL at 11/08/18 0834 .  mirtazapine (REMERON) tablet 15 mg, 15 mg, Oral, QHS, Rosalin Hawking, MD, 15 mg at 11/07/18 2211 .  senna-docusate (Senokot-S) tablet 1 tablet, 1 tablet, Oral, QHS PRN, Amie Portland, MD .  sodium chloride flush (NS) 0.9 % injection 3 mL, 3 mL, Intravenous, Once, Pfeiffer, Marcy, MD .  spironolactone (ALDACTONE) tablet 25 mg, 25 mg, Oral, QPM, Rosalin Hawking, MD, 25 mg at 11/07/18 2215  Patients Current Diet:     Diet Order                  Diet heart healthy/carb modified Room service appropriate? Yes; Fluid consistency: Thin  Diet effective now                Precautions / Restrictions Precautions Precautions: Fall, Other (comment) Precaution Comments: watch O2 sats Restrictions Weight Bearing Restrictions: No   Has the Denise [name] had 2 or more falls or a fall with injury in the past year?No  Prior Activity Level Limited Community (1-2x/wk): Mod I without AD; does not drive  Prior Functional Level Prior Function Level of Independence: Independent Comments: reports not using walker  Self Care: Did the Denise [name] need help bathing, dressing, using the toilet or eating?  Independent  Indoor Mobility: Did the Denise [name] need assistance with walking from room to room (with or without device)? Independent  Stairs: Did the Denise [name] need assistance with internal or external stairs (with or without device)? Needed some help  Functional Cognition: Did the Denise [name] need help planning regular tasks such as shopping or remembering to take medications? Needed some help  Home Assistive Devices / [place] Devices/Equipment: Oxygen Home Equipment: Walker - 2 wheels  Prior Device Use: Indicate devices/aids used by the Denise [name] prior to current illness, exacerbation or injury? no AD  Current Functional Level Cognition  Arousal/Alertness: Awake/alert Overall Cognitive Status: Impaired/Different from baseline Current Attention Level: Sustained Orientation Level: Oriented to person, Oriented to place, Disoriented to time, Disoriented to situation Safety/Judgement: Decreased awareness of safety, Decreased awareness of deficits General Comments: Denise [name] with difficulty communicating needs. Perseverating on finding her "pink in the closet," while pointing to the computer. Denise [name] deduced she was looking  for her denture case but was unable to find it; Denise [name] not comprehending that case was not in room.  Attention: Selective Selective Attention: Appears intact Memory: Impaired Memory Impairment: Other (comment)(benefits from repetition of information)  Awareness: Impaired Awareness Impairment: (intermittent recall of immediate plans)    Extremity Assessment (includes Sensation/Coordination)  Upper Extremity Assessment: LUE deficits/detail, RUE deficits/detail RUE Deficits / Details: generalized weakness LUE Deficits / Details: 2+/5 breaks with very little resistance. Decreased FMC overall LUE Sensation: WNL LUE Coordination: decreased fine motor  Lower Extremity Assessment: Defer to Denise [name] evaluation    ADLs  Overall ADL's : Needs assistance/impaired Eating/Feeding: Set up, Sitting Eating/Feeding Details (indicate cue type and reason): set up to open packaging and arrange tray for successful feeding. Denise [name] able to manage soft foods with R hand utensils Grooming: Set up, Sitting Upper Body Bathing: Sitting, Moderate assistance Lower Body Bathing: Sit to/from stand, Sitting/lateral leans, Maximal assistance Upper Body Dressing : Moderate assistance, Sitting Lower Body Dressing: Maximal assistance, Sit to/from stand, Sitting/lateral leans Toilet Transfer: Minimal assistance, Moderate assistance, BSC, Stand-pivot Toilet Transfer Details (indicate cue type and reason): min A to rise, mod A for steadying with stand pivots- simulated with recliner Toileting- Clothing Manipulation and Hygiene: Moderate assistance, Sit to/from stand, Sitting/lateral lean Functional mobility during ADLs: Minimal assistance, Moderate assistance General ADL Comments: Denise [name] limited by cognitive deficits, L sided weakness, and decreased activity tolerance    Mobility  Overal bed mobility: Needs Assistance Bed Mobility: Supine to Sit Supine to sit: Min guard General bed mobility comments: OOB in chair    Transfers  Overall transfer level: Needs assistance Equipment used: Rolling walker (2 wheeled) Transfers: Sit to/from Stand Sit to Stand: Min assist Stand pivot transfers: Min assist, Mod assist General transfer comment: MinA to rise and stabilize from  recliner    Ambulation / Gait / Stairs / Wheelchair Mobility  Ambulation/Gait Ambulation/Gait assistance: Mod assist Gait Distance (Feet): 75 Feet(75',75') Assistive device: None Gait Pattern/deviations: Step-through pattern, Shuffle, Trunk flexed, Narrow base of support General Gait Details: ModA for stability without assistive device; Denise [name] reaching consistently for external support in hallway. Needs slower pace and increased assist for balance activities i.e. head turns, stepping over obstacles, stops/starts Gait velocity: decr Gait velocity interpretation: <1.8 ft/sec, indicate of risk for recurrent falls    Posture / Balance Dynamic Sitting Balance Sitting balance - Comments: seated on toilet with no UE support Balance Overall balance assessment: Needs assistance Sitting-balance support: Feet supported Sitting balance-Leahy Scale: Fair Sitting balance - Comments: seated on toilet with no UE support Standing balance support: Bilateral upper extremity supported, Single extremity supported, During functional activity Standing balance-Leahy Scale: Poor Standing balance comment: reliant on external support    Special needs/care consideration BiPAP/CPAP n/a CPM n/a Continuous Drip IV n/a Dialysis n/a Life Vest n/a Oxygen) 2 4 liters at baseline Special Bed n/a Trach Size n/a Wound Vac n/a Skin intact Bowel mgmt: continent lbm 11/5 Bladder mgmt: incontinent urgency Diabetic mgmt hgb A1c 7 Behavioral consideration n/a Chemo/radiation n/a Designated visitor is spouse, [name] LOOP placement 11/6   Previous Home Environment  Living Arrangements: Spouse/significant other  Lives With: Spouse Available Help at Discharge: Family, Available 24 hours/day Type of Home: House Home Layout: One level, Laundry or work area in basement Home Access: Stairs to enter Entrance Stairs-Rails: Building surveyor of Steps: 4 Bathroom Shower/Tub: Public librarian, Primary school teacher: Standard Bathroom Accessibility: Yes How Accessible: Accessible via walker Home Care Services: No Additional Comments: home O2; home  info taken with Denise [name] collateral who spoke with husband. Denise [name] able to recall most information  Discharge Living Setting Plans for Discharge Living Setting: Denise [name]'s home, Lives with (comment)(spouse) Type of Home at Discharge: House Discharge Home Layout: One level Discharge Home Access: Stairs to enter Entrance Stairs-Rails: Right Entrance Stairs-Number of Steps: 4 Discharge Bathroom Shower/Tub: Tub/shower unit, Curtain Discharge Bathroom Toilet: Standard Discharge Bathroom Accessibility: Yes How Accessible: Accessible via walker Does the Denise [name] have any problems obtaining your medications?: No  Social/Family/Support Systems Denise [name] Roles: Spouse Contact Information: spouse, Chalmers C[place] Anticipated Caregiver: spouse Anticipated Caregiver's Contact Information: [phone] Ability/Limitations of Caregiver: no limitations Caregiver Availability: 24/7 Discharge Plan Discussed with Primary Caregiver: Yes Is Caregiver In Agreement with Plan?: Yes Does Caregiver/Family have Issues with Lodging/Transportation while Denise [name] is in Rehab?: No  Goals/Additional Needs Denise [name]/Family Goal for Rehab: Mod I to supervision wiht Denise [name], OT, and SLP Expected length of stay: [facility] 7 to 10 days Denise [name]/Family Agrees to Admission and willing to participate: Yes Program Orientation Provided & Reviewed with Denise [name]/Caregiver Including Roles  & Responsibilities: Yes  Decrease burden of Care through IP rehab admission: n/a  Possible need for SNF placement upon discharge: not anticapted   Denise [name] Condition: This Denise [name]'s condition remains as documented in the consult dated 11/5, in which the Rehabilitation Physician determined and documented that the Denise [name]'s condition is appropriate for intensive rehabilitative care in an inpatient rehabilitation facility. Will admit to  inpatient rehab today.  Preadmission Screen Completed By:  Cleatrice Burke, RN, 11/08/2018 4:28 PM ______________________________________________________________________   Discussed status with Dr. Ranell Patrick on 11/08/2018 at  75 and received approval for admission today.  Admission Coordinator:  Cleatrice Burke, time 5749 Date 11/08/2018     Revision History

## 2018-11-08 NOTE — Consult Note (Addendum)
ELECTROPHYSIOLOGY CONSULT NOTE  Patient ID: Denise [name] MRN: [id number], DOB/AGE: May 03, 1945   Admit date: 11/04/2018 Date of Consult: 11/08/2018  Primary Physician: [name], NP Primary Cardiologist: No primary care provider on file.  Primary Electrophysiologist: New to Dr. Curt Bears Reason for Consultation: Cryptogenic stroke; recommendations regarding Implantable Loop Recorder  History of Present Illness EP has been asked to evaluate Denise [name] for placement of an implantable loop recorder to monitor for atrial fibrillation by Dr Erlinda Hong.  The patient was admitted on 11/04/2018 with sudden onset left sided facial weakness, slurred speech, and left arm weakness.  They first developed symptoms while while having lunch with family.  Imaging demonstrated R cerebellar infarct.  Given tPA w/ hemorrhagic transformation. They have undergone workup for stroke including echocardiogram and carotid dopplers.  The patient has been monitored on telemetry which has demonstrated sinus rhythm with no arrhythmias.  Inpatient stroke work-up Trenten Watchman not require a TEE per Neurology.   Echocardiogram this admission demonstrated LVEF 60-65%, LA 4.1 cm.  Lab work is reviewed.  Prior to admission, the patient denies chest pain, shortness of breath, dizziness, palpitations, or syncope.  They are recovering from their stroke with plans to attend CIR  at discharge.  Past Medical History:  Diagnosis Date  . Altered mental status   . Anxiety and depression   . Chronic respiratory failure with hypoxia ([place]) 10/02/2017   Assoc with ? Cor pulmonale dx 09/2017 so rx 02 1-2 lpm 24/7  - 10/17/2017  Walked RA x 3 laps @ 185 ft each stopped due to  End of study, nl pace,  desat to 87 on 3rd lap - 12/19/2017  Saturations on Room Air at Rest =91 % and while Ambulating = 87%  But  on  2 Liters of pulsed oxygen while Ambulating =93% so rec POC 2lpm walking / use at rest if sats under 90%      . Colon cancer  ([place]) 1988   Resected  . COPD (chronic obstructive pulmonary disease) ([place])   . Diabetes mellitus without complication ([name])    diet controlled- no meds. per pt  . Diarrhea 04/02/2018  . Diverticulosis   . Hyperlipidemia   . Hypertension   . Hypoxia 10/30/2018  . Insomnia   . Primary hyperparathyroidism ([place])   . SBO (small bowel obstruction) ([place])   . Stroke ([place]) 10/2018   tpa administered  . Tubular adenoma of rectum 02/23/2013   low grade  . Vitamin D deficiency      Surgical History:  Past Surgical History:  Procedure Laterality Date  . ABDOMINAL HYSTERECTOMY  08/2015  . BREAST BIOPSY Left   . BREAST EXCISIONAL BIOPSY Right   . COLON RESECTION  1980's  . COLONOSCOPY    . ENDOMETRIAL BIOPSY  2009   negative  . INCONTINENCE SURGERY  2017  . RIGHT HEART CATH N/A 09/20/2017   Procedure: RIGHT HEART CATH;  Surgeon: Larey Dresser, MD;  Location: [facility];  Service: Cardiovascular;  Laterality: N/A;  . RIGHT/LEFT HEART CATH AND CORONARY ANGIOGRAPHY N/A 03/19/2018   Procedure: RIGHT/LEFT HEART CATH AND CORONARY ANGIOGRAPHY;  Surgeon: Larey Dresser, MD;  Location: [facility];  Service: Cardiovascular;  Laterality: N/A;     Medications Prior to Admission  Medication Sig Dispense Refill Last Dose  . albuterol (VENTOLIN HFA) 108 (90 Base) MCG/ACT inhaler INHALE 1-2 PUFFS INTO THE LUNGS EVERY 4 HOURS AS NEEDED FOR WHEEZING OR SHORTNESS OF BREATH. (Patient  taking differently: Inhale 1-2 puffs into the lungs every 4 (four) hours as needed for wheezing or shortness of breath. INHALE 1-2 PUFFS INTO THE LUNGS EVERY 4 HOURS AS NEEDED FOR WHEEZING OR SHORTNESS OF BREATH.) 6.7 g 5 11/04/2018 at Unknown time  . aspirin EC 81 MG EC tablet Take 1 tablet (81 mg total) by mouth daily. 30 tablet 0 11/04/2018 at Unknown time  . atorvastatin (LIPITOR) 10 MG tablet TAKE 1 TABLET BY MOUTH EVERYDAY AT BEDTIME (Patient taking differently: Take 10 mg by mouth at bedtime. ) 90 tablet 3  11/03/2018  . bisoprolol (ZEBETA) 5 MG tablet Take 2.5 mg by mouth daily.   11/04/2018 at 0900  . famotidine (PEPCID) 40 MG tablet TAKE 1 TABLET BY MOUTH TWICE A DAY (Patient taking differently: Take 40 mg by mouth 2 (two) times daily. ) 180 tablet 0 11/04/2018 at Unknown time  . furosemide (LASIX) 40 MG tablet Take 1 tablet (40 mg total) by mouth daily. 30 tablet 11 11/04/2018 at Unknown time  . isosorbide-hydrALAZINE (BIDIL) 20-37.5 MG tablet Take 2 tablets by mouth 3 (three) times daily. 180 tablet 3 11/04/2018 at Unknown time  . KLOR-CON M10 10 MEQ tablet TAKE 2 TABLETS (20 MEQ TOTAL) BY MOUTH DAILY. FOR LOW POTASSIUM. (Patient taking differently: Take 5 mEq by mouth daily. ) 60 tablet 3 11/04/2018 at Unknown time  . losartan (COZAAR) 100 MG tablet Take 50 mg by mouth daily.   11/04/2018 at Unknown time  . metoprolol succinate (TOPROL-XL) 25 MG 24 hr tablet Take 1 tablet (25 mg total) by mouth daily. 30 tablet 1 11/04/2018 at 0900  . mirtazapine (REMERON) 15 MG tablet TAKE 1 TABLET BY MOUTH AT BEDTIME. FOR DEPRESSION AND APPETITE. (Patient taking differently: Take 15 mg by mouth at bedtime. ) 90 tablet 3 11/03/2018  . OXYGEN Inhale 4 L into the lungs continuous.    11/04/2018 at Unknown time  . Polyethylene Glycol 3350 (MIRALAX PO) Take 17 g by mouth as needed.   Past Month at Unknown time  . spironolactone (ALDACTONE) 25 MG tablet Take 1 tablet (25 mg total) by mouth daily. Take at night (Patient taking differently: Take 25 mg by mouth every evening. ) 30 tablet 6 11/03/2018  . Blood Glucose Calibration (ACCU-CHEK AVIVA) SOLN USE AS INSTRUCTED TO TEST BLOOD SUGAR DAILY (Patient taking differently: 1 each by Other route daily. ) 1 each 2   . Blood Glucose Monitoring Suppl (ACCU-CHEK AVIVA PLUS) w/Device KIT Use as instructed to test blood sugar up to 3 times daily (Patient taking differently: 1 each by Other route 3 (three) times daily. ) 1 kit 0   . Fluticasone-Umeclidin-Vilant (TRELEGY ELLIPTA) 100-62.5-25  MCG/INH AEPB Inhale 1 puff into the lungs daily.     [name] glucose blood (ACCU-CHEK AVIVA PLUS) test strip Use as instructed to test blood sugar up to 3 times daily 300 each 1   . Lancets (ACCU-CHEK SOFT TOUCH) lancets Use as instructed to test blood sugar up to 3 times daily (Patient taking differently: 1 each by Other route 3 (three) times daily. ) 300 each 1     Inpatient Medications:  .  stroke: mapping our early stages of recovery book   Does not apply Once  . aspirin EC  325 mg Oral Daily  . atorvastatin  20 mg Oral QHS  . Chlorhexidine Gluconate Cloth  6 each Topical Daily  . famotidine  20 mg Oral Daily  . fluticasone furoate-vilanterol  1 puff Inhalation Daily  And  . umeclidinium bromide  1 puff Inhalation Daily  . furosemide  20 mg Oral Daily  . insulin aspart  0-15 Units Subcutaneous TID WC  . insulin aspart  0-5 Units Subcutaneous QHS  . isosorbide-hydrALAZINE  2 tablet Oral TID  . labetalol  20 mg Intravenous Once  . mouth rinse  15 mL Mouth Rinse BID  . mirtazapine  15 mg Oral QHS  . sodium chloride flush  3 mL Intravenous Once  . spironolactone  25 mg Oral QPM    Allergies: No Known Allergies  Social History   Socioeconomic History  . Marital status: Married    Spouse name: Not on file  . Number of children: 3  . Years of education: Not on file  . Highest education level: Not on file  Occupational History  . Not on file  Social Needs  . Financial resource strain: Not on file  . Food insecurity    Worry: Not on file    Inability: Not on file  . Transportation needs    Medical: Not on file    Non-medical: Not on file  Tobacco Use  . Smoking status: Former Smoker    Packs/day: 1.00    Years: 30.00    Pack years: 30.00    Types: Cigarettes    Quit date: 1989    Years since quitting: 31.8  . Smokeless tobacco: Never Used  Substance and Sexual Activity  . Alcohol use: No  . Drug use: No  . Sexual activity: Not Currently    Partners: Male    Birth  control/protection: Post-menopausal, Surgical  Lifestyle  . Physical activity    Days per week: Not on file    Minutes per session: Not on file  . Stress: Not on file  Relationships  . Social Herbalist on phone: Not on file    Gets together: Not on file    Attends religious service: Not on file    Active member of club or organization: Not on file    Attends meetings of clubs or organizations: Not on file    Relationship status: Not on file  . Intimate partner violence    Fear of current or ex partner: Not on file    Emotionally abused: Not on file    Physically abused: Not on file    Forced sexual activity: Not on file  Other Topics Concern  . Not on file  Social History Narrative   Married. 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 Ovarian cancer Mother   . Breast cancer Neg Hx   . Hyperparathyroidism Neg Hx   . Colon cancer Neg Hx   . Esophageal cancer Neg Hx   . Stomach cancer Neg Hx       Review of Systems: All other systems reviewed and are otherwise negative except as noted above.  Physical Exam: Vitals:   11/08/18 0057 11/08/18 0100 11/08/18 0330 11/08/18 0732  BP: (!) 87/53 (!) 90/50 (!) 99/58   Pulse: 76 73 64   Resp: 16  18   Temp: 98 F (36.7 C)  98.1 F (36.7 C)   TempSrc: Oral     SpO2: 100%  100% 100%  Weight:      Height:  GEN- The patient is well appearing, alert and oriented x 3 today.   Head- normocephalic, atraumatic Eyes-  Sclera clear, conjunctiva pink Ears- hearing intact Oropharynx- clear Neck- supple Lungs- Clear to ausculation bilaterally, normal work of breathing Heart- Regular rate and rhythm, no murmurs, rubs or gallops  GI- soft, NT, ND, + BS Extremities- no clubbing, cyanosis, or edema MS- no significant deformity or atrophy Skin- no rash or lesion Psych- euthymic  mood, full affect   Labs:   Lab Results  Component Value Date   WBC 4.8 11/08/2018   HGB 11.3 (L) 11/08/2018   HCT 35.9 (L) 11/08/2018   MCV 81.8 11/08/2018   PLT 211 11/08/2018    Recent Labs  Lab 11/04/18 1500  11/08/18 0258  NA 141   < > 141  K 3.6   < > 3.2*  CL 96*   < > 100  CO2 31   < > 33*  BUN 31*   < > 22  CREATININE 1.36*   < > 1.15*  CALCIUM 11.5*   < > 10.4*  PROT 7.8  --   --   BILITOT 0.8  --   --   ALKPHOS 105  --   --   ALT 35  --   --   AST 32  --   --   GLUCOSE 183*   < > 177*   < > = values in this interval not displayed.     Radiology/Studies: Ct Head Wo Contrast  Result Date: 11/07/2018 CLINICAL DATA:  Stroke follow-up EXAM: CT HEAD WITHOUT CONTRAST TECHNIQUE: Contiguous axial images were obtained from the base of the skull through the vertex without intravenous contrast. COMPARISON:  CT and brain MRI from 2 days ago FINDINGS: Brain: Unchanged area of hemorrhage in the inferior right cerebellum with regional edema. Measurement is confounded by mixed density from brain parenchyma intervening blood. Local mass effect with fourth ventricular narrowing is stable. New small to moderate superficial left inferior cerebellar infarct. Small cortical infarcts along the right cerebral convexity that are known from comparison MRI. Nonacute small left superior frontal cortex infarct. No hydrocephalus or extra-axial collection. Vascular: Atherosclerosis.  No hyperdense vessel. Skull: Negative Sinuses/Orbits: Negative These results were communicated to at 9:41 amon 11/5/2020by text page via the [facility] messaging system. IMPRESSION: 1. New small to moderate infarct in the lateral left cerebellum. 2. Unchanged appearance of hemorrhagic infarct with swelling in the inferior right cerebellum. No hydrocephalus. 3. Unchanged appearance of nonhemorrhagic small cortical infarcts along the right cerebral convexity. Electronically Signed   By: Monte Fantasia M.D.   On: 11/07/2018 09:42    Ct Head Wo Contrast  Result Date: 11/05/2018 CLINICAL DATA:  Follow-up examination for intracranial hemorrhage. EXAM: CT HEAD WITHOUT CONTRAST TECHNIQUE: Contiguous axial images were obtained from the base of the skull through the vertex without intravenous contrast. COMPARISON:  Prior CT from 11/04/2018. FINDINGS: Brain: Hemorrhagic right cerebellar infarct again seen. Intraparenchymal hematoma centered at the inferior right cerebellum is not significantly changed in size as compared to previous, although the hemorrhage is slightly more well-defined as compared to prior. Surrounding regional mass effect and edema is little interval change as well. Similar mass effect on the adjacent fourth ventricle and fourth ventricular outflow tract. Stable ventricular size without hydrocephalus or trapping. No other new acute intracranial hemorrhage. No new acute large vessel territory infarct. Atrophy with chronic microvascular ischemic disease again noted. No mass lesion or midline shift. No extra-axial fluid collection. Vascular: No hyperdense  vessel. Scattered vascular calcifications noted within the carotid siphons. Skull: Scalp soft tissues and calvarium within normal limits. Sinuses/Orbits: Globes and orbital soft tissues within normal limits. Paranasal sinuses and mastoid air cells remain largely clear. Other: None. IMPRESSION: 1. No significant interval change in size of hemorrhagic right cerebellar infarct with similar mass regional mass effect. No hydrocephalus or ventricular trapping. 2. No other new acute intracranial abnormality. Electronically Signed   By: Jeannine Boga M.D.   On: 11/05/2018 03:11   Ct Head Wo Contrast  Result Date: 11/04/2018 CLINICAL DATA:  Follow-up examination for acute stroke, status post tPA. EXAM: CT HEAD WITHOUT CONTRAST TECHNIQUE: Contiguous axial images were obtained from the base of the skull through the vertex without intravenous contrast. COMPARISON:  Prior head CT  from earlier the same day. FINDINGS: Brain: Evolving acute to subacute right cerebellar infarct again seen. There has been interval hemorrhagic transformation with hemorrhage now seen within the area of infarction. Dominant intraparenchymal hemorrhage measures approximately 3.2 x 3.0 x 1.6 cm (estimated volume 8 cc). Associated regional mass effect throughout the right posterior fossa. Partial effacement of the adjacent fourth ventricle and fourth ventricular outflow tract which remains patent at this time. Stable ventricular size without obstructive hydrocephalus. No other acute intracranial hemorrhage. No other acute large vessel territory infarct. No mass lesion or midline shift. No extra-axial fluid collection. Underlying age-related cerebral atrophy with moderate chronic microvascular ischemic disease again noted. Small chronic left frontal infarct noted as well. Vascular: No hyperdense vessel. Scattered vascular calcifications noted within the carotid siphons. Skull: Scalp soft tissues and calvarium within normal limits. Sinuses/Orbits: Globes and orbital soft tissues within normal limits. Paranasal sinuses remain clear. No mastoid effusion. Other: None. IMPRESSION: 1. Acute to subacute right cerebellar infarct with interval hemorrhagic transformation. Dominant intraparenchymal hemorrhage measures approximately 3.2 x 3.0 x 1.6 cm (estimated volume 8 CC). Associated regional mass effect throughout the right posterior fossa without obstructive hydrocephalus. 2. No other new acute intracranial abnormality. Critical Value/emergent results were called by telephone at the time of interpretation on 11/04/2018 at 7:46 pm to provider Dr. Cheral Marker, Who verbally acknowledged these results. Electronically Signed   By: Jeannine Boga M.D.   On: 11/04/2018 19:53   Ct Angio Chest Pe W Or Wo Contrast  Result Date: 10/30/2018 CLINICAL DATA:  73 year old female with shortness of breath. Evaluate for pulmonary embolism.  EXAM: CT ANGIOGRAPHY CHEST WITH CONTRAST TECHNIQUE: Multidetector CT imaging of the chest was performed using the standard protocol during bolus administration of intravenous contrast. Multiplanar CT image reconstructions and MIPs were obtained to evaluate the vascular anatomy. CONTRAST:  55m OMNIPAQUE IOHEXOL 350 MG/ML SOLN COMPARISON:  Chest CT dated 03/19/2018 FINDINGS: Evaluation is limited due to streak artifact caused by patient's arms as well as due to cachexia. Cardiovascular: Moderate cardiomegaly. No pericardial effusion. Multi vessel coronary vascular calcification. There is retrograde flow of contrast from the right atrium into the IVC in keeping with right heart dysfunction. Correlation with echocardiogram recommended. There is moderate atherosclerotic calcification of the thoracic aorta. Evaluation of the aorta is limited due to suboptimal opacification. There is dilatation of the main pulmonary trunk suggestive of a degree of pulmonary hypertension. There is no CT evidence of pulmonary embolism. Mediastinum/Nodes: No hilar or mediastinal adenopathy. Multiple thyroid nodules noted. No mediastinal fluid collection. Lungs/Pleura: Severe centrilobular emphysema. Small left pleural effusion with subsegmental atelectasis of the left lower lobe. Right lung base linear atelectasis/scarring. There is no pneumothorax. The central airways are patent. Upper Abdomen: No  acute abnormality. Musculoskeletal: No chest wall abnormality. No acute or significant osseous findings. There is loss of subcutaneous fat and cachexia. Review of the MIP images confirms the above findings. IMPRESSION: 1. No CT evidence of pulmonary embolism. 2. Moderate cardiomegaly with evidence of right heart dysfunction. Correlation with echocardiogram recommended. 3. Small left pleural effusion with subsegmental atelectasis of the left lower lobe. 4. Severe emphysema with findings of pulmonary hypertension. 5. Aortic Atherosclerosis  (ICD10-I70.0) and Emphysema (ICD10-J43.9). Electronically Signed   By: Anner Crete M.D.   On: 10/30/2018 00:08   Mr Angio Head Wo Contrast  Result Date: 11/05/2018 CLINICAL DATA:  Stroke, follow-up. EXAM: MRI HEAD WITHOUT CONTRAST MRA HEAD WITHOUT CONTRAST TECHNIQUE: Multiplanar, multiecho pulse sequences of the brain and surrounding structures were obtained without intravenous contrast. Angiographic images of the head were obtained using MRA technique without contrast. COMPARISON:  Prior head CT examinations 11/05/2018 and earlier FINDINGS: MRI HEAD FINDINGS Brain: There are multiple small acute/subacute cortical infarcts within the right frontal, parietal and occipital lobes in the right middle cerebral artery vascular territory. Of note, this includes small acute/subacute cortical infarcts within the right frontal lobe motor strip. Subtle petechial hemorrhage at site of several of these small acute infarct A large hemorrhagic right cerebellar infarct is difficult to accurately measure by MR modality, although appears similar to head CT performed earlier the same day. Posterior fossa mass effect appears similar to prior examination with persistent partial effacement of the inferior fourth ventricle and inferior displacement of the right cerebellar tonsil. No hydrocephalus. There are small chronic hemorrhagic cortical infarcts within the left frontal lobe (involving the left superior and middle frontal gyri) and paramedian left parietal lobe. Additional small chronic hemorrhagic and nonhemorrhagic cortical infarcts within the bilateral occipital lobes. Chronic lacunar infarcts within the right thalamus and left cerebellum. There is a background of mild chronic small vessel ischemic disease. Mild generalized parenchymal atrophy. No supratentorial midline shift. No extra-axial fluid collection. Vascular: Reported separately Skull and upper cervical spine: The sagittal T1 weighted sequence is motion degraded.  Within this limitation, no focal marrow lesion is identified Sinuses/Orbits: Visualized orbits demonstrate no acute abnormality. Trace ethmoid sinus mucosal thickening. No significant mastoid effusion MRA HEAD FINDINGS The intracranial internal carotid arteries are patent. Atherosclerotic irregularity of these vessels bilaterally with sites of mild stenosis. The bilateral middle and anterior cerebral arteries are patent without significant proximal stenosis. No intracranial aneurysm is identified. The intracranial vertebral arteries are patent bilaterally. Atherosclerotic irregularity of the non dominant intracranial left vertebral artery with sites of up to moderate stenosis. The basilar artery is patent without significant stenosis. The posterior cerebral arteries are patent bilaterally. Apparent mild focal stenosis within the proximal P2 right posterior cerebral artery. These results were called by telephone at the time of interpretation on 11/05/2018 at 5:35 pm to provider [facility], who verbally acknowledged these results. IMPRESSION: MR brain: 1. Large acute/subacute hemorrhagic right cerebellar infarct not significantly changed from head CT earlier the same day. Posterior fossa mass effect is also similar with partial effacement of the fourth ventricle. No hydrocephalus 2. Multiple additional small acute/subacute cortical infarcts within the right MCA vascular territory, some of which demonstrate subtle petechial hemorrhage. 3. Multiple small chronic hemorrhagic and nonhemorrhagic cortical infarcts within the bilateral cerebral hemispheres. 4. Small chronic lacunar infarcts within the right thalamus and left cerebellum. Background of mild chronic small vessel ischemic disease. MRA head: 1. No intracranial large vessel occlusion. 2. Intracranial atherosclerotic disease. Most notably, there are sites of  up to moderate stenosis within the non dominant left left vertebral artery. Additional mild stenoses as  described. Electronically Signed   By: Kellie Simmering DO   On: 11/05/2018 17:38   Mr Brain Wo Contrast  Result Date: 11/05/2018 CLINICAL DATA:  Stroke, follow-up. EXAM: MRI HEAD WITHOUT CONTRAST MRA HEAD WITHOUT CONTRAST TECHNIQUE: Multiplanar, multiecho pulse sequences of the brain and surrounding structures were obtained without intravenous contrast. Angiographic images of the head were obtained using MRA technique without contrast. COMPARISON:  Prior head CT examinations 11/05/2018 and earlier FINDINGS: MRI HEAD FINDINGS Brain: There are multiple small acute/subacute cortical infarcts within the right frontal, parietal and occipital lobes in the right middle cerebral artery vascular territory. Of note, this includes small acute/subacute cortical infarcts within the right frontal lobe motor strip. Subtle petechial hemorrhage at site of several of these small acute infarct A large hemorrhagic right cerebellar infarct is difficult to accurately measure by MR modality, although appears similar to head CT performed earlier the same day. Posterior fossa mass effect appears similar to prior examination with persistent partial effacement of the inferior fourth ventricle and inferior displacement of the right cerebellar tonsil. No hydrocephalus. There are small chronic hemorrhagic cortical infarcts within the left frontal lobe (involving the left superior and middle frontal gyri) and paramedian left parietal lobe. Additional small chronic hemorrhagic and nonhemorrhagic cortical infarcts within the bilateral occipital lobes. Chronic lacunar infarcts within the right thalamus and left cerebellum. There is a background of mild chronic small vessel ischemic disease. Mild generalized parenchymal atrophy. No supratentorial midline shift. No extra-axial fluid collection. Vascular: Reported separately Skull and upper cervical spine: The sagittal T1 weighted sequence is motion degraded. Within this limitation, no focal marrow  lesion is identified Sinuses/Orbits: Visualized orbits demonstrate no acute abnormality. Trace ethmoid sinus mucosal thickening. No significant mastoid effusion MRA HEAD FINDINGS The intracranial internal carotid arteries are patent. Atherosclerotic irregularity of these vessels bilaterally with sites of mild stenosis. The bilateral middle and anterior cerebral arteries are patent without significant proximal stenosis. No intracranial aneurysm is identified. The intracranial vertebral arteries are patent bilaterally. Atherosclerotic irregularity of the non dominant intracranial left vertebral artery with sites of up to moderate stenosis. The basilar artery is patent without significant stenosis. The posterior cerebral arteries are patent bilaterally. Apparent mild focal stenosis within the proximal P2 right posterior cerebral artery. These results were called by telephone at the time of interpretation on 11/05/2018 at 5:35 pm to provider [facility], who verbally acknowledged these results. IMPRESSION: MR brain: 1. Large acute/subacute hemorrhagic right cerebellar infarct not significantly changed from head CT earlier the same day. Posterior fossa mass effect is also similar with partial effacement of the fourth ventricle. No hydrocephalus 2. Multiple additional small acute/subacute cortical infarcts within the right MCA vascular territory, some of which demonstrate subtle petechial hemorrhage. 3. Multiple small chronic hemorrhagic and nonhemorrhagic cortical infarcts within the bilateral cerebral hemispheres. 4. Small chronic lacunar infarcts within the right thalamus and left cerebellum. Background of mild chronic small vessel ischemic disease. MRA head: 1. No intracranial large vessel occlusion. 2. Intracranial atherosclerotic disease. Most notably, there are sites of up to moderate stenosis within the non dominant left left vertebral artery. Additional mild stenoses as described. Electronically Signed   By: Kellie Simmering DO   On: 11/05/2018 17:38   Dg Chest Port 1 View  Result Date: 10/30/2018 CLINICAL DATA:  Shortness of breath. EXAM: PORTABLE CHEST 1 VIEW COMPARISON:  10/28/2018 FINDINGS: Heart size upper limits of  normal. Chronic aortic atherosclerosis. The lungs are clear. The vascularity is normal. No effusions. No acute bone finding. IMPRESSION: No active disease. Chronic aortic atherosclerosis. Heart size upper limits of normal. Electronically Signed   By: Nelson Chimes M.D.   On: 10/30/2018 17:01   Dg Chest Portable 1 View  Result Date: 10/28/2018 CLINICAL DATA:  Chest pain and shortness of breath EXAM: PORTABLE CHEST 1 VIEW COMPARISON:  03/15/2018 FINDINGS: Cardiomegaly with central vascular congestion. Background emphysema suspected. No definite focal pneumonia, collapse or consolidation. Negative for edema, effusion or pneumothorax. Trachea midline. Aorta atherosclerotic. Degenerative changes of the spine with associated scoliosis. IMPRESSION: Cardiomegaly with vascular congestion Emphysema Atherosclerosis No significant interval change or superimposed acute process Electronically Signed   By: Jerilynn Mages.  Shick M.D.   On: 10/28/2018 20:15   Ct Head Code Stroke Wo Contrast  Result Date: 11/04/2018 CLINICAL DATA:  Code stroke.  Left-sided weakness EXAM: CT HEAD WITHOUT CONTRAST TECHNIQUE: Contiguous axial images were obtained from the base of the skull through the vertex without intravenous contrast. COMPARISON:  2017 FINDINGS: Brain: There is no acute intracranial hemorrhage. New but chronic appearing small left frontal infarct (series 3, image 17). There is patchy hypoattenuation within the right cerebellar hemisphere, which may reflect age-indeterminate infarction, noting artifact in this region. Additional areas of patchy confluent hypoattenuation in the supratentorial white matter are nonspecific med probably 5 moderate chronic microvascular ischemic changes. Prominence of the ventricles and sulci reflects  mild generalized parenchymal volume loss. There is no extra-axial fluid collection Vascular: Hyperdensity along the basilar artery is likely artifactual. Skull: Calvarium is unremarkable. Sinuses/Orbits: Aerated.  Unremarkable. Other: None. ASPECTS [facility] Stroke Program Early CT Score) - Ganglionic level infarction (caudate, lentiform nuclei, internal capsule, insula, M1-M3 cortex): 7 - Supraganglionic infarction (M4-M6 cortex): 3 Total score (0-10 with 10 being normal): 10 IMPRESSION: 1. No acute intracranial hemorrhage. Suspected age-indeterminate right cerebellar infarction. 2. ASPECTS is 10 3. Chronic microvascular ischemic changes. Small chronic left frontal infarct. Electronically Signed   By: Macy Mis M.D.   On: 11/04/2018 14:35   Vas US Carotid  Result Date: 11/06/2018 Carotid Arterial Duplex Study Indications:       CVA. Comparison Study:  No prior study Performing Technologist: Maudry Mayhew MHA, RDMS, RVT, RDCS  Examination Guidelines: A complete evaluation includes B-mode imaging, spectral Doppler, color Doppler, and power Doppler as needed of all accessible portions of each vessel. Bilateral testing is considered an integral part of a complete examination. Limited examinations for reoccurring indications may be performed as noted.  Right Carotid Findings: +----------+--------+--------+--------+--------------------------+--------+           PSV cm/sEDV cm/sStenosisPlaque Description        Comments +----------+--------+--------+--------+--------------------------+--------+ CCA Prox  43      9                                                  +----------+--------+--------+--------+--------------------------+--------+ CCA Distal43      11              smooth and heterogenous            +----------+--------+--------+--------+--------------------------+--------+ ICA Prox  21      6               heterogenous and irregular          +----------+--------+--------+--------+--------------------------+--------+ ICA Distal45  8                                                  +----------+--------+--------+--------+--------------------------+--------+ ECA       44      6               smooth and heterogenous            +----------+--------+--------+--------+--------------------------+--------+ +----------+--------+-------+----------------+-------------------+           PSV cm/sEDV cmsDescribe        Arm Pressure (mmHG) +----------+--------+-------+----------------+-------------------+ [id number]             Multiphasic, WNL                    +----------+--------+-------+----------------+-------------------+ +---------+--------+--+--------+--+---------+ VertebralPSV cm/s59EDV cm/s13Antegrade +---------+--------+--+--------+--+---------+  Left Carotid Findings: +----------+--------+--------+--------+----------------------+--------+           PSV cm/sEDV cm/sStenosisPlaque Description    Comments +----------+--------+--------+--------+----------------------+--------+ CCA Prox  68      13                                             +----------+--------+--------+--------+----------------------+--------+ CCA Distal76      16              smooth and homogeneous         +----------+--------+--------+--------+----------------------+--------+ ICA Prox  56      11                                             +----------+--------+--------+--------+----------------------+--------+ ICA Distal58      16                                             +----------+--------+--------+--------+----------------------+--------+ ECA       56      9                                              +----------+--------+--------+--------+----------------------+--------+ +----------+--------+--------+----------------+-------------------+           PSV cm/sEDV cm/sDescribe        Arm Pressure (mmHG)  +----------+--------+--------+----------------+-------------------+ [id number]              Multiphasic, WNL                    +----------+--------+--------+----------------+-------------------+ +---------+--------+--+--------+-+---------+ VertebralPSV cm/s18EDV cm/s5Antegrade +---------+--------+--+--------+-+---------+  Summary: Right Carotid: Velocities in the right ICA are consistent with a 1-39% stenosis. Left Carotid: Velocities in the left ICA are consistent with a 1-39% stenosis. Vertebrals:  Bilateral vertebral arteries demonstrate antegrade flow. Subclavians: Normal flow hemodynamics were seen in bilateral subclavian              arteries. *See table(s) above for measurements and observations.  Electronically signed by Curt Jews MD on 11/06/2018 at 6:29:27 PM.    Final    Vas [place] Lower Extremity Venous (dvt)  Result Date:  11/06/2018  Lower Venous Study Indications: Stroke.  Comparison Study: no prior Performing Technologist: Abram Sander RVS  Examination Guidelines: A complete evaluation includes B-mode imaging, spectral Doppler, color Doppler, and power Doppler as needed of all accessible portions of each vessel. Bilateral testing is considered an integral part of a complete examination. Limited examinations for reoccurring indications may be performed as noted.  +---------+---------------+---------+-----------+----------+--------------+ RIGHT    CompressibilityPhasicitySpontaneityPropertiesThrombus Aging +---------+---------------+---------+-----------+----------+--------------+ CFV      Full           Yes      Yes                                 +---------+---------------+---------+-----------+----------+--------------+ SFJ      Full                                                        +---------+---------------+---------+-----------+----------+--------------+ FV Prox  Full                                                         +---------+---------------+---------+-----------+----------+--------------+ FV Mid   Full                                                        +---------+---------------+---------+-----------+----------+--------------+ FV DistalFull                                                        +---------+---------------+---------+-----------+----------+--------------+ PFV      Full                                                        +---------+---------------+---------+-----------+----------+--------------+ POP      Full           Yes      Yes                                 +---------+---------------+---------+-----------+----------+--------------+ PTV      Full                                                        +---------+---------------+---------+-----------+----------+--------------+ PERO     Full                                                        +---------+---------------+---------+-----------+----------+--------------+   +---------+---------------+---------+-----------+----------+--------------+  LEFT     CompressibilityPhasicitySpontaneityPropertiesThrombus Aging +---------+---------------+---------+-----------+----------+--------------+ CFV      Full           Yes      Yes                                 +---------+---------------+---------+-----------+----------+--------------+ SFJ      Full                                                        +---------+---------------+---------+-----------+----------+--------------+ FV Prox  Full                                                        +---------+---------------+---------+-----------+----------+--------------+ FV Mid   Full                                                        +---------+---------------+---------+-----------+----------+--------------+ FV DistalFull                                                         +---------+---------------+---------+-----------+----------+--------------+ PFV      Full                                                        +---------+---------------+---------+-----------+----------+--------------+ POP      Full           Yes      Yes                                 +---------+---------------+---------+-----------+----------+--------------+ PTV      Full                                                        +---------+---------------+---------+-----------+----------+--------------+ PERO     Full                                                        +---------+---------------+---------+-----------+----------+--------------+     Summary: Right: There is no evidence of deep vein thrombosis in the lower extremity. No cystic structure found in the popliteal fossa. Left: There is no evidence of deep vein thrombosis in the lower extremity. No cystic structure found in the popliteal fossa.  *  See table(s) above for measurements and observations. Electronically signed by Curt Jews MD on 11/06/2018 at 6:27:42 PM.    Final     12-lead ECG 11/04/2018 shows NSR at 60 bpm (personally reviewed) EKG 02/20/2018 shows possible afib vs MAT vs NSR with frequent PACs EKG 10/26 shows irregular "sinus" tachycardia EKG 10/27 shows irregular SVT; Subsequent EKGs read as MAT   Telemetry NSR 60-70s (personally reviewed)  Assessment and Plan:  1. Cryptogenic stroke The patient presents with cryptogenic stroke.  The patient does not have a TEE planned for this AM.  I spoke at length with the patient about monitoring for afib with an implantable loop recorder.  Risks, benefits, and alteratives to implantable loop recorder were discussed with the patient today.   At this time, the patient is very clear in their decision to proceed with implantable loop recorder. She would like to wait for her husband to arrive today to proceed.   Wound care was reviewed with the patient (keep  incision clean and dry for 3 days).  Wound check scheduled and entered in AVS. Please call with questions.   Corazon Friar, PA-C 11/08/2018 7:46 AM  I have seen and examined this patient with Oda Kilts.  Agree with above, note added to reflect my findings.  On exam, RRR, no murmurs, lungs clear.  Patient presented to the hospital with cryptogenic stroke. To date, no cause has been found. TEE planned for today. If unrevealing, Fabricio Endsley plan for LINQ monitor to look for atrial fibrillation. Risks and benefits discussed. Risks include but not limited to bleeding and infection. The patient understands the risks and has agreed to the procedure.  Salimah Martinovich M. Lakeysha Slutsky MD 11/08/2018 8:36 AM

## 2018-11-09 ENCOUNTER — Inpatient Hospital Stay (HOSPITAL_COMMUNITY): Payer: Medicare HMO

## 2018-11-09 ENCOUNTER — Inpatient Hospital Stay (HOSPITAL_COMMUNITY): Payer: Medicare HMO | Admitting: Physical Therapy

## 2018-11-09 ENCOUNTER — Other Ambulatory Visit: Payer: Self-pay

## 2018-11-09 DIAGNOSIS — I639 Cerebral infarction, unspecified: Secondary | ICD-10-CM

## 2018-11-09 LAB — CBC WITH DIFFERENTIAL/PLATELET
Abs Immature Granulocytes: 0.02 10*3/uL (ref 0.00–0.07)
Basophils Absolute: 0 10*3/uL (ref 0.0–0.1)
Basophils Relative: 0 %
Eosinophils Absolute: 0.1 10*3/uL (ref 0.0–0.5)
Eosinophils Relative: 3 %
HCT: 35.9 % — ABNORMAL LOW (ref 36.0–46.0)
Hemoglobin: 11.4 g/dL — ABNORMAL LOW (ref 12.0–15.0)
Immature Granulocytes: 0 %
Lymphocytes Relative: 15 %
Lymphs Abs: 0.8 10*3/uL (ref 0.7–4.0)
MCH: 26 pg (ref 26.0–34.0)
MCHC: 31.8 g/dL (ref 30.0–36.0)
MCV: 82 fL (ref 80.0–100.0)
Monocytes Absolute: 0.6 10*3/uL (ref 0.1–1.0)
Monocytes Relative: 11 %
Neutro Abs: 3.6 10*3/uL (ref 1.7–7.7)
Neutrophils Relative %: 71 %
Platelets: 193 10*3/uL (ref 150–400)
RBC: 4.38 MIL/uL (ref 3.87–5.11)
RDW: 14.9 % (ref 11.5–15.5)
WBC: 5.1 10*3/uL (ref 4.0–10.5)
nRBC: 0 % (ref 0.0–0.2)

## 2018-11-09 LAB — URINALYSIS, ROUTINE W REFLEX MICROSCOPIC
Bacteria, UA: NONE SEEN
Bilirubin Urine: NEGATIVE
Glucose, UA: NEGATIVE mg/dL
Hgb urine dipstick: NEGATIVE
Ketones, ur: NEGATIVE mg/dL
Nitrite: NEGATIVE
Protein, ur: NEGATIVE mg/dL
Specific Gravity, Urine: 1.011 (ref 1.005–1.030)
WBC, UA: >50 WBC/hpf — ABNORMAL HIGH (ref 0–5)
pH: 7 (ref 5.0–8.0)

## 2018-11-09 LAB — COMPREHENSIVE METABOLIC PANEL
ALT: 22 U/L (ref 0–44)
AST: 30 U/L (ref 15–41)
Albumin: 3.1 g/dL — ABNORMAL LOW (ref 3.5–5.0)
Alkaline Phosphatase: 68 U/L (ref 38–126)
Anion gap: 9 (ref 5–15)
BUN: 23 mg/dL (ref 8–23)
CO2: 31 mmol/L (ref 22–32)
Calcium: 10.4 mg/dL — ABNORMAL HIGH (ref 8.9–10.3)
Chloride: 103 mmol/L (ref 98–111)
Creatinine, Ser: 1.19 mg/dL — ABNORMAL HIGH (ref 0.44–1.00)
GFR calc Af Amer: 52 mL/min — ABNORMAL LOW (ref >60)
GFR calc non Af Amer: 45 mL/min — ABNORMAL LOW (ref >60)
Glucose, Bld: 143 mg/dL — ABNORMAL HIGH (ref 70–99)
Potassium: 3.9 mmol/L (ref 3.5–5.1)
Sodium: 143 mmol/L (ref 135–145)
Total Bilirubin: 0.4 mg/dL (ref 0.3–1.2)
Total Protein: 5.1 g/dL — ABNORMAL LOW (ref 6.5–8.1)

## 2018-11-09 LAB — GLUCOSE, CAPILLARY
Glucose-Capillary: 222 mg/dL — ABNORMAL HIGH (ref 70–99)
Glucose-Capillary: 156 mg/dL — ABNORMAL HIGH (ref 70–99)

## 2018-11-09 MED ORDER — CLONIDINE HCL 0.1 MG PO TABS: 0.1000 mg | ORAL_TABLET | ORAL | Status: DC | PRN

## 2018-11-09 NOTE — H&P (Signed)
Physical Medicine and Rehabilitation Admission H&P     CC: Stroke with functional decline.      HPI:  Denise [name] is a 73 year old female with history fo T2Dm, HTN, COPD with chronic hypoxic respiratory failure, colon cancer who was admitted on 11/04/18 with sudden onset of left sided weakness and slurred speech. Patient with recent admission 10/26-10/31/20 for COPD exacerbation with acute on chronic respiratory failure. CT head negative for bleed and she was treated with tPA. She developed severe HA with elevated BP later that evening with CT head showed right cerebellar infarct with hemorrhagic transformation and she received cryoprecipitate and TXA. Elevated BP treated with cleveprex.  MRI/MRA brain done revealing large right cerebellar hemorrhagic infarct with mass effect on posterior fossa and multiple additional small acute/subacute cortical infarcts in R-MCA territory with multiple small chronic hemorrhagic/non hemorrhargic cortical infarcts in bilateral cerebral hemispheres.  Carotid dopplers was negative for significant stenosis. 2 D echo showed EF 60-65% with severe LVH and grde one diastolic dysfunction.    Serial CT 11/5 showed new small to moderate left cerebellar infarct. Dr. Erlinda Hong felt that stroke could be due to high grade stenosis L-VA with relative low BP but likely embolic and loop recorder recommended to rule out embolic source. BLE dopplers negative for DVT. She was started on 325 mg ASA for secondary stroke prevention and loop placed 11/6 by Dr. Curt Bears. Therapy ongoing and patient limited by balance deficits with left sided weakness. CIR recommended due to functional decline.    She and her husband would like inpatient rehabilitation to help Denise [name] gain strength and improve her balance before she returns home. She denies any problems with sleep, pain, or constipation. She says her goal is to be able to pick things up better. She was able to walk with therapy today. Her  husband states that he and his wife are retired and that he will be home with her upon discharge.    Review of Systems  Constitutional: Negative for chills and fever.  HENT: Negative for hearing loss and tinnitus.   Eyes: Negative for blurred vision and double vision.  Respiratory: Positive for shortness of breath. Negative for cough and wheezing.   Cardiovascular: Negative for chest pain and palpitations.  Gastrointestinal: Negative for constipation, heartburn and nausea.  Genitourinary: Positive for dysuria, frequency and urgency.  Musculoskeletal: Negative for back pain, myalgias and neck pain.  Skin: Negative for rash.  Neurological: Positive for sensory change (left hand) and headaches (not as bad now). Negative for dizziness.  Psychiatric/Behavioral: The patient does not have insomnia.             Past Medical History:  Diagnosis Date  . Altered mental status    . Anxiety and depression    . Chronic respiratory failure with hypoxia ([place]) 10/02/2017    Assoc with ? Cor pulmonale dx 09/2017 so rx 02 1-2 lpm 24/7  - 10/17/2017  Walked RA x 3 laps @ 185 ft each stopped due to  End of study, nl pace,  desat to 87 on 3rd lap - 12/19/2017  Saturations on Room Air at Rest =91 % and while Ambulating = 87%  But  on  2 Liters of pulsed oxygen while Ambulating =93% so rec POC 2lpm walking / use at rest if sats under 90%      . Colon cancer ([place]) 1988    Resected  . COPD (chronic obstructive pulmonary disease) ([place])    . Diabetes mellitus without  complication ([name])      diet controlled- no meds. per pt  . Diarrhea 04/02/2018  . Diverticulosis    . Hyperlipidemia    . Hypertension    . Hypoxia 10/30/2018  . Insomnia    . Primary hyperparathyroidism ([place])    . SBO (small bowel obstruction) ([place])    . Stroke ([place]) 10/2018    tpa administered  . Tubular adenoma of rectum 02/23/2013    low grade  . Vitamin D deficiency             Past Surgical History:  Procedure Laterality Date  .  ABDOMINAL HYSTERECTOMY   08/2015  . BREAST BIOPSY Left    . BREAST EXCISIONAL BIOPSY Right    . COLON RESECTION   1980's  . COLONOSCOPY      . ENDOMETRIAL BIOPSY   2009    negative  . INCONTINENCE SURGERY   2017  . RIGHT HEART CATH N/A 09/20/2017    Procedure: RIGHT HEART CATH;  Surgeon: Larey Dresser, MD;  Location: [facility];  Service: Cardiovascular;  Laterality: N/A;  . RIGHT/LEFT HEART CATH AND CORONARY ANGIOGRAPHY N/A 03/19/2018    Procedure: RIGHT/LEFT HEART CATH AND CORONARY ANGIOGRAPHY;  Surgeon: Larey Dresser, MD;  Location: [facility];  Service: Cardiovascular;  Laterality: N/A;           Family History  Problem Relation Age of Onset  . Ovarian cancer Mother    . Breast cancer Neg Hx    . Hyperparathyroidism Neg Hx    . Colon cancer Neg Hx    . Esophageal cancer Neg Hx    . Stomach cancer Neg Hx        Social History:  Married. Used to work in housekeeping. She reports that she quit smoking about 31 years ago. Her smoking use included cigarettes. She has a 30.00 pack-year smoking history. She has never used smokeless tobacco. She reports that she does not drink alcohol or use drugs.      Allergies: No Known Allergies            Medications Prior to Admission  Medication Sig Dispense Refill  . albuterol (VENTOLIN HFA) 108 (90 Base) MCG/ACT inhaler INHALE 1-2 PUFFS INTO THE LUNGS EVERY 4 HOURS AS NEEDED FOR WHEEZING OR SHORTNESS OF BREATH. (Patient taking differently: Inhale 1-2 puffs into the lungs every 4 (four) hours as needed for wheezing or shortness of breath. INHALE 1-2 PUFFS INTO THE LUNGS EVERY 4 HOURS AS NEEDED FOR WHEEZING OR SHORTNESS OF BREATH.) 6.7 g 5  . aspirin EC 81 MG EC tablet Take 1 tablet (81 mg total) by mouth daily. 30 tablet 0  . atorvastatin (LIPITOR) 10 MG tablet TAKE 1 TABLET BY MOUTH EVERYDAY AT BEDTIME (Patient taking differently: Take 10 mg by mouth at bedtime. ) 90 tablet 3  . bisoprolol (ZEBETA) 5 MG tablet Take 2.5 mg  by mouth daily.      . famotidine (PEPCID) 40 MG tablet TAKE 1 TABLET BY MOUTH TWICE A DAY (Patient taking differently: Take 40 mg by mouth 2 (two) times daily. ) 180 tablet 0  . furosemide (LASIX) 40 MG tablet Take 1 tablet (40 mg total) by mouth daily. 30 tablet 11  . isosorbide-hydrALAZINE (BIDIL) 20-37.5 MG tablet Take 2 tablets by mouth 3 (three) times daily. 180 tablet 3  . KLOR-CON M10 10 MEQ tablet TAKE 2 TABLETS (20 MEQ TOTAL) BY MOUTH DAILY. FOR LOW POTASSIUM. (Patient taking differently:  Take 5 mEq by mouth daily. ) 60 tablet 3  . losartan (COZAAR) 100 MG tablet Take 50 mg by mouth daily.      . metoprolol succinate (TOPROL-XL) 25 MG 24 hr tablet Take 1 tablet (25 mg total) by mouth daily. 30 tablet 1  . mirtazapine (REMERON) 15 MG tablet TAKE 1 TABLET BY MOUTH AT BEDTIME. FOR DEPRESSION AND APPETITE. (Patient taking differently: Take 15 mg by mouth at bedtime. ) 90 tablet 3  . OXYGEN Inhale 4 L into the lungs continuous.       . Polyethylene Glycol 3350 (MIRALAX PO) Take 17 g by mouth as needed.      [name] spironolactone (ALDACTONE) 25 MG tablet Take 1 tablet (25 mg total) by mouth daily. Take at night (Patient taking differently: Take 25 mg by mouth every evening. ) 30 tablet 6  . Blood Glucose Calibration (ACCU-CHEK AVIVA) SOLN USE AS INSTRUCTED TO TEST BLOOD SUGAR DAILY (Patient taking differently: 1 each by Other route daily. ) 1 each 2  . Blood Glucose Monitoring Suppl (ACCU-CHEK AVIVA PLUS) w/Device KIT Use as instructed to test blood sugar up to 3 times daily (Patient taking differently: 1 each by Other route 3 (three) times daily. ) 1 kit 0  . Fluticasone-Umeclidin-Vilant (TRELEGY ELLIPTA) 100-62.5-25 MCG/INH AEPB Inhale 1 puff into the lungs daily.      [name] glucose blood (ACCU-CHEK AVIVA PLUS) test strip Use as instructed to test blood sugar up to 3 times daily 300 each 1  . Lancets (ACCU-CHEK SOFT TOUCH) lancets Use as instructed to test blood sugar up to 3 times daily (Patient taking  differently: 1 each by Other route 3 (three) times daily. ) 300 each 1      Drug Regimen Review  Drug regimen was reviewed and remains appropriate with no significant issues identified   Home: Home Living Family/patient expects to be discharged to:: Private residence Living Arrangements: Spouse/significant other Available Help at Discharge: Family, Available 24 hours/day Type of Home: House Home Access: Stairs to enter [employer] of Steps: 4 Entrance Stairs-Rails: Right Home Layout: One level, Laundry or work area in basement [employer] Shower/Tub: Public librarian, Architectural technologist: Standard Bathroom Accessibility: Yes Home Equipment: Environmental consultant - 2 wheels Additional Comments: home O2; home info taken with PT collateral who spoke with husband. Pt able to recall most information  Lives With: Spouse   Functional History: Prior Function Level of Independence: Independent Comments: reports not using walker   Functional Status:  Mobility: Bed Mobility Overal bed mobility: Needs Assistance Bed Mobility: Supine to Sit Supine to sit: Min guard General bed mobility comments: OOB in chair Transfers Overall transfer level: Needs assistance Equipment used: Rolling walker (2 wheeled) Transfers: Sit to/from Stand Sit to Stand: Min assist Stand pivot transfers: Min assist, Mod assist General transfer comment: MinA to rise and stabilize from recliner Ambulation/Gait Ambulation/Gait assistance: Mod assist Gait Distance (Feet): 75 Feet(75',75') Assistive device: None Gait Pattern/deviations: Step-through pattern, Shuffle, Trunk flexed, Narrow base of support General Gait Details: ModA for stability without assistive device; pt reaching consistently for external support in hallway. Needs slower pace and increased assist for balance activities i.e. head turns, stepping over obstacles, stops/starts Gait velocity: decr Gait velocity interpretation: <1.8 ft/sec, indicate of  risk for recurrent falls   ADL: ADL Overall ADL's : Needs assistance/impaired Eating/Feeding: Set up, Sitting Eating/Feeding Details (indicate cue type and reason): set up to open packaging and arrange tray for successful feeding. Pt able to manage soft foods  with R hand utensils Grooming: Set up, Sitting Upper Body Bathing: Sitting, Moderate assistance Lower Body Bathing: Sit to/from stand, Sitting/lateral leans, Maximal assistance Upper Body Dressing : Moderate assistance, Sitting Lower Body Dressing: Maximal assistance, Sit to/from stand, Sitting/lateral leans Toilet Transfer: Minimal assistance, Moderate assistance, BSC, Stand-pivot Toilet Transfer Details (indicate cue type and reason): min A to rise, mod A for steadying with stand pivots- simulated with recliner Toileting- Clothing Manipulation and Hygiene: Moderate assistance, Sit to/from stand, Sitting/lateral lean Functional mobility during ADLs: Minimal assistance, Moderate assistance General ADL Comments: pt limited by cognitive deficits, L sided weakness, and decreased activity tolerance   Cognition: Cognition Overall Cognitive Status: Impaired/Different from baseline Arousal/Alertness: Awake/alert Orientation Level: Oriented to person, Oriented to place, Disoriented to time, Disoriented to situation Attention: Selective Selective Attention: Appears intact Memory: Impaired Memory Impairment: Other (comment)(benefits from repetition of information) Awareness: Impaired Awareness Impairment: (intermittent recall of immediate plans) Cognition Arousal/Alertness: Awake/alert Behavior During Therapy: WFL for tasks assessed/performed Overall Cognitive Status: Impaired/Different from baseline Area of Impairment: Safety/judgement, Attention, Memory Orientation Level: Disoriented to, Situation Current Attention Level: Sustained Memory: Decreased recall of precautions, Decreased short-term memory Safety/Judgement: Decreased  awareness of safety, Decreased awareness of deficits General Comments: Pt with difficulty communicating needs. Perseverating on finding her "pink in the closet," while pointing to the computer. PT deduced she was looking for her denture case but was unable to find it; pt not comprehending that case was not in room.      Blood pressure (!) 132/91, pulse 84, temperature (!) 97.4 F (36.3 C), temperature source Oral, resp. rate (!) 32, height _0  (1.676 m), weight 44.9 kg, SpO2 100 %. Physical Exam  Nursing note and vitals reviewed. Constitutional: She is oriented to person, place, and time. She appears well-developed and well-nourished.  Respiratory: No stridor. No respiratory distress. She has no wheezes.  GI: She exhibits no distension. There is no abdominal tenderness.  Neurological: She is alert and oriented to person, place, and time.  Left facial weakness with mild dysarthria. Able to follow basic commands LUE weakness with ataxia      Lab Results Last 48 Hours        Results for orders placed or performed during the hospital encounter of 11/04/18 (from the past 48 hour(s))  Glucose, capillary     Status: None    Collection Time: 11/06/18 10:14 PM  Result Value Ref Range    Glucose-Capillary 93 70 - 99 mg/dL  Glucose, capillary     Status: None    Collection Time: 11/06/18 10:52 PM  Result Value Ref Range    Glucose-Capillary 95 70 - 99 mg/dL  CBC     Status: Abnormal    Collection Time: 11/07/18  5:04 AM  Result Value Ref Range    WBC 5.3 4.0 - 10.5 K/uL    RBC 4.57 3.87 - 5.11 MIL/uL    Hemoglobin 11.9 (L) 12.0 - 15.0 g/dL    HCT 37.7 36.0 - 46.0 %    MCV 82.5 80.0 - 100.0 fL    MCH 26.0 26.0 - 34.0 pg    MCHC 31.6 30.0 - 36.0 g/dL    RDW 14.8 11.5 - 15.5 %    Platelets 219 150 - 400 K/uL    nRBC 0.0 0.0 - 0.2 %      Comment: Performed at [facility], [place] [address], [place], [place] [address]  Basic metabolic panel     Status: Abnormal    Collection Time:  11/07/18  5:04 AM  Result Value Ref Range    Sodium 142 135 - 145 mmol/L    Potassium 3.0 (L) 3.5 - 5.1 mmol/L    Chloride 99 98 - 111 mmol/L    CO2 33 (H) 22 - 32 mmol/L    Glucose, Bld 128 (H) 70 - 99 mg/dL    BUN 22 8 - 23 mg/dL    Creatinine, Ser 1.17 (H) 0.44 - 1.00 mg/dL    Calcium 10.7 (H) 8.9 - 10.3 mg/dL    GFR calc non Af Amer 46 (L) >60 mL/min    GFR calc Af Amer 54 (L) >60 mL/min    Anion gap 10 5 - 15      Comment: Performed at [facility] [address], [place], [place] [address]  Glucose, capillary     Status: Abnormal    Collection Time: 11/07/18  6:13 AM  Result Value Ref Range    Glucose-Capillary 111 (H) 70 - 99 mg/dL    Comment 1 Notify RN      Comment 2 Document in Chart    Glucose, capillary     Status: Abnormal    Collection Time: 11/07/18  3:41 PM  Result Value Ref Range    Glucose-Capillary 181 (H) 70 - 99 mg/dL  CBC     Status: Abnormal    Collection Time: 11/08/18  2:58 AM  Result Value Ref Range    WBC 4.8 4.0 - 10.5 K/uL    RBC 4.39 3.87 - 5.11 MIL/uL    Hemoglobin 11.3 (L) 12.0 - 15.0 g/dL    HCT 35.9 (L) 36.0 - 46.0 %    MCV 81.8 80.0 - 100.0 fL    MCH 25.7 (L) 26.0 - 34.0 pg    MCHC 31.5 30.0 - 36.0 g/dL    RDW 14.7 11.5 - 15.5 %    Platelets 211 150 - 400 K/uL    nRBC 0.0 0.0 - 0.2 %      Comment: Performed at [facility], [place] [address], [place], [place] [address]  Basic metabolic panel     Status: Abnormal    Collection Time: 11/08/18  2:58 AM  Result Value Ref Range    Sodium 141 135 - 145 mmol/L    Potassium 3.2 (L) 3.5 - 5.1 mmol/L    Chloride 100 98 - 111 mmol/L    CO2 33 (H) 22 - 32 mmol/L    Glucose, Bld 177 (H) 70 - 99 mg/dL    BUN 22 8 - 23 mg/dL    Creatinine, Ser 1.15 (H) 0.44 - 1.00 mg/dL    Calcium 10.4 (H) 8.9 - 10.3 mg/dL    GFR calc non Af Amer 47 (L) >60 mL/min    GFR calc Af Amer 55 (L) >60 mL/min    Anion gap 8 5 - 15      Comment: Performed at [facility] [address] [place],  [place] [address]  Glucose, capillary     Status: Abnormal    Collection Time: 11/08/18  6:09 AM  Result Value Ref Range    Glucose-Capillary 118 (H) 70 - 99 mg/dL    Comment 1 Notify RN    Glucose, capillary     Status: Abnormal    Collection Time: 11/08/18  7:54 AM  Result Value Ref Range    Glucose-Capillary 111 (H) 70 - 99 mg/dL  Glucose, capillary     Status: Abnormal  Collection Time: 11/08/18 12:14 PM  Result Value Ref Range    Glucose-Capillary 173 (H) 70 - 99 mg/dL  Glucose, capillary     Status: Abnormal    Collection Time: 11/08/18  4:09 PM  Result Value Ref Range    Glucose-Capillary 176 (H) 70 - 99 mg/dL      Imaging Results (Last 48 hours)  Ct Head Wo Contrast   Result Date: 11/07/2018 CLINICAL DATA:  Stroke follow-up EXAM: CT HEAD WITHOUT CONTRAST TECHNIQUE: Contiguous axial images were obtained from the base of the skull through the vertex without intravenous contrast. COMPARISON:  CT and brain MRI from 2 days ago FINDINGS: Brain: Unchanged area of hemorrhage in the inferior right cerebellum with regional edema. Measurement is confounded by mixed density from brain parenchyma intervening blood. Local mass effect with fourth ventricular narrowing is stable. New small to moderate superficial left inferior cerebellar infarct. Small cortical infarcts along the right cerebral convexity that are known from comparison MRI. Nonacute small left superior frontal cortex infarct. No hydrocephalus or extra-axial collection. Vascular: Atherosclerosis.  No hyperdense vessel. Skull: Negative Sinuses/Orbits: Negative These results were communicated to at 9:41 amon 11/5/2020by text page via the [facility] messaging system. IMPRESSION: 1. New small to moderate infarct in the lateral left cerebellum. 2. Unchanged appearance of hemorrhagic infarct with swelling in the inferior right cerebellum. No hydrocephalus. 3. Unchanged appearance of nonhemorrhagic small cortical infarcts along the right cerebral  convexity. Electronically Signed   By: Monte Fantasia M.D.   On: 11/07/2018 09:42            Medical Problem List and Plan: 1.  Impaired fait and mobility secondary to acute ischemic stroke 2.  Antithrombotics: -DVT/anticoagulation:  Mechanical: Sequential compression devices, below knee Bilateral lower extremities BLE dopplers negative on 11/4             -antiplatelet therapy: ASA 325 mg daily 3. Headaches/Pain Management: Tylenol prn 4. Mood: LCSW to follow for evaluation and support. She has a history of anxiety and depression.             -antipsychotic agents: N/A 5. Neuropsych: This patient is capable of making decisions on her own behalf. 6. Skin/Wound Care: Keep incision C/D for 3 days. Monitor wound for healing.  7. Fluids/Electrolytes/Nutrition: Maintain adequate nutritional and hydration status. She has severe protein calorie nutrition and is currently on Mirtazepine 43m HS to stimulate appetite.  8. HTN: Monitor BP tid and avoid hypotension due to high grade L-VA stenosis. BP goals <160 due to cerebellar bleed. Currently on Lasix, aldactone and Imdur--metoprolol and Losartan on hold at this time.  9. T2DM: Hgb A1c- 7.0. Monitor BS ac/hs with SSI for now.  Was on glucotrol 2.5 mg daily prior to last admission.   10. COPD:  4 liters Oxygen dependent.  Respiratory status stable on Incruse and Breo daily.  11. Chronic systolic CHF: Monitor for signs of overload and check weight daily. Continue spironolactone, Imdur, Lipitor, ASA and lasix daily.   12. Hypokalemia: Will likely need daily supplement as still low. Will add kdur bid--check labs in am.  13. ABLA: Recheck CBC in am.   14. CKD?: BUN/SCr - 31/1.36 at admission likely due to recent diuresis. Continue to monitor with serial checks.  15. Dysuria with incontinence:  Will check UA.UCS    PBary Leriche PA-C 11/08/2018    I have personally performed a face to face diagnostic evaluation, including, but not limited to relevant  history and physical exam findings,  of this patient and developed relevant assessment and plan.  Additionally, I have reviewed and concur with the physician assistant's documentation above.   Leeroy Cha, MD

## 2018-11-09 NOTE — Evaluation (Signed)
Physical Therapy Assessment and Plan  Patient Details  Name: Denise [name] MRN: [id number] Date of Birth: May 02, 1945  PT Diagnosis: Abnormal posture, Abnormality of gait, Difficulty walking, Hemiparesis non-dominant, Impaired cognition and Muscle weakness Rehab Potential: Good ELOS: 10-14 days   Today's Date: 11/09/2018 PT Individual Time: 4098-1191 PT Individual Time Calculation (min): 69 min    Problem List:  Patient Active Problem List   Diagnosis Date Noted  . ICH (intracerebral hemorrhage) ([place]) s/p tPA administration 11/08/2018  . Hypertensive urgency 11/08/2018  . Cerebellar stroke, acute ([place]) 11/08/2018  . Acute ischemic stroke ([place]) 11/04/2018  . COPD exacerbation ([place]) 10/28/2018  . Acute on chronic systolic CHF (congestive heart failure) ([place]) 10/28/2018  . Elevated troponin 10/28/2018  . Lactic acidosis 10/28/2018  . GERD (gastroesophageal reflux disease) 10/28/2018  . Acute on chronic respiratory failure with hypercapnia ([place]) 10/28/2018  . Diabetes mellitus without complication-diet controled 10/28/2018  . On home oxygen therapy 07/12/2018  . CHF (congestive heart failure) ([place]) 05/16/2018  . Chronic respiratory failure with hypoxia and hypercapnia ([place]) 05/03/2018  . Borderline abnormal TFTs 05/02/2018  . Type 2 diabetes mellitus ([place]) 04/26/2018  . Unintended weight loss 04/26/2018  . Lower extremity edema 02/20/2018  . Chronic respiratory failure with hypoxia ([place]) 10/02/2017  . Hematuria 09/27/2017  . Pulmonary hypertension ([name]) c/w cor pulmonale   . Protein-calorie malnutrition, severe 09/17/2017  . Constipation   . Hyperparathyroidism, primary ([place]) 07/27/2016  . Loss of appetite 06/02/2016  . COPD GOLD  II   11/22/2015  . Vitamin D deficiency 11/22/2015  . Anxiety and depression 11/22/2015  . Insomnia 11/22/2015  . Hyperlipidemia 11/22/2015  . Essential hypertension 11/22/2015  . Hx of colonic polyps 02/10/2013  . History of colon cancer  06/11/1986    Past Medical History:  Past Medical History:  Diagnosis Date  . Altered mental status   . Anxiety and depression   . Chronic respiratory failure with hypoxia ([place]) 10/02/2017   Assoc with ? Cor pulmonale dx 09/2017 so rx 02 1-2 lpm 24/7  - 10/17/2017  Walked RA x 3 laps @ 185 ft each stopped due to  End of study, nl pace,  desat to 87 on 3rd lap - 12/19/2017  Saturations on Room Air at Rest =91 % and while Ambulating = 87%  But  on  2 Liters of pulsed oxygen while Ambulating =93% so rec POC 2lpm walking / use at rest if sats under 90%      . Colon cancer ([place]) 1988   Resected  . COPD (chronic obstructive pulmonary disease) ([place])   . Diabetes mellitus without complication ([name])    diet controlled- no meds. per pt  . Diarrhea 04/02/2018  . Diverticulosis   . Hyperlipidemia   . Hypertension   . Hypoxia 10/30/2018  . Insomnia   . Primary hyperparathyroidism ([place])   . SBO (small bowel obstruction) ([place])   . Stroke ([place]) 10/2018   tpa administered  . Tubular adenoma of rectum 02/23/2013   low grade  . Vitamin D deficiency    Past Surgical History:  Past Surgical History:  Procedure Laterality Date  . ABDOMINAL HYSTERECTOMY  08/2015  . BREAST BIOPSY Left   . BREAST EXCISIONAL BIOPSY Right   . COLON RESECTION  1980's  . COLONOSCOPY    . ENDOMETRIAL BIOPSY  2009   negative  . INCONTINENCE SURGERY  2017  . RIGHT HEART CATH N/A 09/20/2017   Procedure: RIGHT HEART CATH;  Surgeon: Larey Dresser, MD;  Location: [facility];  Service: Cardiovascular;  Laterality: N/A;  . RIGHT/LEFT HEART CATH AND CORONARY ANGIOGRAPHY N/A 03/19/2018   Procedure: RIGHT/LEFT HEART CATH AND CORONARY ANGIOGRAPHY;  Surgeon: Larey Dresser, MD;  Location: [facility];  Service: Cardiovascular;  Laterality: N/A;    Assessment & Plan Clinical Impression: Patient is a 73 y.o. year old female with history fo T2Dm, HTN, COPD with chronic hypoxic respiratory failure, colon cancer who  was admitted on 11/04/18 with sudden onset of left sided weakness and slurred speech. Patient with recent admission 10/26-10/31/20 for COPD exacerbation with acute on chronic respiratory failure. CT head negative for bleed and she was treated with tPA. She developed severe HA with elevated BP later that evening with CT head showed right cerebellar infarct with hemorrhagic transformation and she received cryoprecipitate and TXA. Elevated BP treated with cleveprex. MRI/MRA brain done revealing large right cerebellar hemorrhagic infarct with mass effect on posterior fossa and multiple additional small acute/subacute cortical infarcts in R-MCA territory with multiple small chronic hemorrhagic/non hemorrhargic cortical infarcts in bilateral cerebral hemispheres. Carotid dopplers was negative for significant stenosis. 2 D echo showed EF 60-65% with severe LVH and grde one diastolic dysfunction.   Serial CT 11/5 showed new small to moderate left cerebellar infarct. Dr. Erlinda Hong felt that stroke could be due to high grade stenosis L-VA with relative low BP but likely embolic and loop recorder recommended to rule out embolic source. BLE dopplers negative for DVT. She was started on 325 mg ASA for secondary stroke prevention and loop placed 11/6 by Dr. Curt Bears. Therapy ongoing and patient limited by balance deficits with left sided weakness. CIR recommended due to functional decline.Patient transferred to CIR on 11/08/2018 .   Patient currently requires min with mobility secondary to muscle weakness, decreased cardiorespiratoy endurance and decreased oxygen support, unbalanced muscle activation, decreased awareness, decreased safety awareness and decreased memory and decreased sitting balance, decreased standing balance, decreased postural control and decreased balance strategies.  Prior to hospitalization, patient was independent  with mobility and lived with   in a House home.  Home access is 4Stairs to enter.  Patient  will benefit from skilled PT intervention to maximize safe functional mobility, minimize fall risk and decrease caregiver burden for planned discharge home with 24 hour supervision. Anticipate patient will benefit from follow up [place] at discharge.  PT - End of Session Activity Tolerance: Tolerates 30+ min activity with multiple rests Endurance Deficit: Yes Endurance Deficit Description: requires rest break after every task PT Assessment Rehab Potential (ACUTE/IP ONLY): Good PT Barriers to Discharge: Inaccessible home environment;Medical stability;Home environment access/layout PT Patient demonstrates impairments in the following area(s): Balance;Safety;Endurance;Motor;Pain;Nutrition;Perception;Behavior PT Transfers Functional Problem(s): Bed Mobility;Bed to Chair;Car;Furniture PT Locomotion Functional Problem(s): Ambulation;Stairs PT Plan PT Intensity: Minimum of 1-2 x/day ,45 to 90 minutes PT Frequency: 5 out of 7 days PT Duration Estimated Length of Stay: 10-14 days PT Treatment/Interventions: Ambulation/gait training;Community reintegration;DME/adaptive equipment instruction;Neuromuscular re-education;Psychosocial support;Stair training;UE/LE Strength taining/ROM;Balance/vestibular training;Discharge planning;Functional electrical stimulation;Pain management;Skin care/wound management;Therapeutic Activities;UE/LE Coordination activities;Cognitive remediation/compensation;Disease management/prevention;Functional mobility training;Patient/family education;Splinting/orthotics;Therapeutic Exercise;Visual/perceptual remediation/compensation PT Transfers Anticipated Outcome(s): supervision PT Locomotion Anticipated Outcome(s): supervision PT Recommendation Follow Up Recommendations: Home health PT;24 hour supervision/assistance Patient destination: Home Equipment Recommended: To be determined  Skilled Therapeutic Intervention Evaluation completed (see details above and below) with education on PT  POC and goals and individual treatment initiated with focus on activity tolerance, bed mobility, transfers, gait, and stair navigation, as well as education regarding daily therapy schedule, weekly team  meetings, purpose of PT evaluation, and other CIR information. Patient received sitting on toilet with nursing staff present and therapist assumed care of patient as pt agreeable to therapy session. Pt maintained on 4L of O2 via nasal cannula throughout session as this is her baseline. Sit<>stand toilet/grab bars with min assist for lifting/balance - pt able to perform peri-care with CGA for standing balance. Pt noted to have increased effort of breathing throughout session. SpO2 ranging 88-94% during toileting. Standing with min assist for balance, therapist performed mod assist LB clothing management. Ambulated ~63f to EOB, no AD, with min assist for balance. Vitals in sitting: BP 172/109 (MAP 127), HR 77bpm, SpO2 95%. Supine<>, HOB slightly elevated but no bedrails, with supervision. Provided seated rest break with vitals reassessed: BP 159/104 (MAP 122), HR 75bpm, SpO2 95%. Sit<>stand, no AD, with min assist for balance throughout session. Ambulated 1155f no AD, with min assist for balance - demonstrates decreased gait speed with decreased BLE step length and increased anterior trunk flexion. Reassessed vitals: BP 178/110 (MAP 130), HR 103-119bpm, SpO2 90%. After seated rest break: BP 166/102 (MAP 121), HR 68bpm, SpO2 100%. Transported to/from gym in w/c for energy conservation. Ambulatory simulated car transfer, no AD and small SUV height, with min assist for balance. Ambulated ~1568f2 up/down ramp, no AD, with min assist for balance. Reassessed vitals: BP 183/106 (MAP 127), HR 65bpm, SpO2 92%. After seated rest break: BP 155/105 (MAP 121), HR 69bpm, SpO2 92%. Ascended/descended 4 steps using BHRs x2 (seated break between) with min assist for balance - reciprocal pattern on ascent and step to progressed to  reciprocal pattern on descent. Assessed vitals after 1st stair trial: BP 158/105 (MAP 122), HR 72bpm, SpO2 89-100%. Transported back to room in w/c. Pt suddenly reporting need to put in her dentures despite them already being in her mouth. Stand pivot w/c>recliner, no AD, with min assist for balance. Pt left seated in recliner with needs in reach, seat belt alarm on, and her husband present. RN notified of pt's elevated BP during therapy session.  PT Evaluation Precautions/Restrictions Precautions Precautions: Fall;Other (comment) Precaution Comments: monitor SpO2 sats (on 4L of O2 via nasal cannula baseline) and BP (MD approved for therapy with BP <180/110) Restrictions Weight Bearing Restrictions: No   Vital Signs Therapy Vitals Pulse Rate: 75 Resp: (!) 22 BP: (!) 172/109 Patient Position (if appropriate): Sitting Oxygen Therapy SpO2: 97 % O2 Device: Nasal Cannula O2 Flow Rate (L/min): 4 L/min Pain Pain Assessment Pain Scale: 0-10 Pain Score: 0-No pain Home Living/Prior Functioning Home Living Available Help at Discharge: Family;Available 24 hours/day Type of Home: House Home Access: Stairs to enter [employer] Steps: 4 Entrance Stairs-Rails: Right;Left Home Layout: One level Bathroom Shower/Tub: TubProduct/process development scientisttandard Bathroom Accessibility: Yes Additional Comments: home O2; home info taken with PT collateral who spoke with husband. 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: reports not using AD PerCytogeneticistithin Functional Limits Praxis Praxis: Intact  Cognition Overall Cognitive Status: Impaired/Different from baseline Arousal/Alertness:  Awake/alert Orientation Level: Oriented to person;Oriented to place;Disoriented to situation;Oriented to time(pt reports reason for admition was due to SOB unable to recall the CVA) Attention: Focused Focused Attention: Appears intact Selective Attention: Appears intact Memory: Impaired Awareness: Impaired Safety/Judgment: Impaired Sensation Sensation Light Touch: Appears  Intact Hot/Cold: Not tested Proprioception: Appears Intact Stereognosis: Not tested Coordination Gross Motor Movements are Fluid and Coordinated: Yes Fine Motor Movements are Fluid and Coordinated: No Motor  Motor Motor: Hemiplegia;Abnormal postural alignment and control  Mobility Bed Mobility Bed Mobility: Supine to Sit;Sit to Supine Supine to Sit: Supervision/Verbal cueing Sit to Supine: Supervision/Verbal cueing Transfers Transfers: Sit to Stand;Stand Pivot Transfers;Stand to Sit Sit to Stand: Minimal Assistance - Patient > 75% Stand to Sit: Minimal Assistance - Patient > 75% Stand Pivot Transfers: Minimal Assistance - Patient > 75% Stand Pivot Transfer Details: Verbal cues for technique;Verbal cues for sequencing;Verbal cues for gait pattern;Verbal cues for precautions/safety;Tactile cues for sequencing;Tactile cues for placement;Tactile cues for posture Transfer (Assistive device): None Locomotion  Gait Ambulation: Yes Gait Assistance: Minimal Assistance - Patient > 75% Gait Distance (Feet): 117 Feet Assistive device: None Gait Assistance Details: Tactile cues for sequencing;Tactile cues for posture;Verbal cues for sequencing;Verbal cues for gait pattern Gait Gait: Yes Gait Pattern: Impaired Gait Pattern: Decreased step length - left;Decreased step length - right;Decreased stride length Gait velocity: decreased Stairs / Additional Locomotion Stairs: Yes Stairs Assistance: Minimal Assistance - Patient > 75% Stair Management Technique: Two rails Number of Stairs: 4 Height of Stairs: 6 Ramp:  Minimal Assistance - Patient >75% Curb: Minimal Assistance - Patient >75% Wheelchair Mobility Wheelchair Mobility: No  Trunk/Postural Assessment  Cervical Assessment Cervical Assessment: Exceptions to WFL(forward head) Thoracic Assessment Thoracic Assessment: Exceptions to WFL(thoracic kyphosis) Lumbar Assessment Lumbar Assessment: Exceptions to WFL(posterior pelvic tilt) Postural Control Postural Control: Deficits on evaluation Protective Responses: delayed and inadequate Postural Limitations: decreased  Balance Balance Balance Assessed: Yes Static Sitting Balance Static Sitting - Balance Support: Feet supported Static Sitting - Level of Assistance: 5: Stand by assistance Dynamic Sitting Balance Dynamic Sitting - Balance Support: Feet supported Dynamic Sitting - Level of Assistance: 5: Stand by assistance Static Standing Balance Static Standing - Balance Support: During functional activity Static Standing - Level of Assistance: 4: Min assist Dynamic Standing Balance Dynamic Standing - Balance Support: During functional activity Dynamic Standing - Level of Assistance: 4: Min assist Extremity Assessment      RLE Assessment RLE Assessment: Exceptions to [facility] RLE Strength Right Hip Flexion: 4-/5 Right Knee Flexion: 4-/5 Right Knee Extension: 4-/5 Right Ankle Dorsiflexion: 4-/5 Right Ankle Plantar Flexion: 4-/5 LLE Assessment LLE Assessment: Exceptions to [facility] LLE Strength Left Hip Flexion: 3+/5 Left Knee Flexion: 3+/5 Left Knee Extension: 3+/5 Left Ankle Dorsiflexion: 3+/5 Left Ankle Plantar Flexion: 3+/5    Refer to Care Plan for Long Term Goals  Recommendations for other services: None   Discharge Criteria: Patient will be discharged from PT if patient refuses treatment 3 consecutive times without medical reason, if treatment goals not met, if there is a change in medical status, if patient makes no progress towards goals or if patient is discharged from  hospital.  The above assessment, treatment plan, treatment alternatives and goals were discussed and mutually agreed upon: by patient and by family  Tawana Scale, PT DPT 11/09/2018, 8:00 AM

## 2018-11-09 NOTE — Evaluation (Signed)
Speech Language Pathology Assessment and Plan  Patient Details  Name: Denise [name] MRN: [id number] Date of Birth: 1945/02/09  SLP Diagnosis: Speech and Language deficits  Rehab Potential: Good ELOS: 10-14 days    Today's Date: 11/09/2018 SLP Individual Time: 0803-0900 SLP Individual Time Calculation (min): 57 min   Problem List:  Patient Active Problem List   Diagnosis Date Noted  . ICH (intracerebral hemorrhage) ([place]) s/p tPA administration 11/08/2018  . Hypertensive urgency 11/08/2018  . Cerebellar stroke, acute ([place]) 11/08/2018  . Acute ischemic stroke ([place]) 11/04/2018  . COPD exacerbation ([place]) 10/28/2018  . Acute on chronic systolic CHF (congestive heart failure) ([place]) 10/28/2018  . Elevated troponin 10/28/2018  . Lactic acidosis 10/28/2018  . GERD (gastroesophageal reflux disease) 10/28/2018  . Acute on chronic respiratory failure with hypercapnia ([place]) 10/28/2018  . Diabetes mellitus without complication-diet controled 10/28/2018  . On home oxygen therapy 07/12/2018  . CHF (congestive heart failure) ([place]) 05/16/2018  . Chronic respiratory failure with hypoxia and hypercapnia ([place]) 05/03/2018  . Borderline abnormal TFTs 05/02/2018  . Type 2 diabetes mellitus ([place]) 04/26/2018  . Unintended weight loss 04/26/2018  . Lower extremity edema 02/20/2018  . Chronic respiratory failure with hypoxia ([place]) 10/02/2017  . Hematuria 09/27/2017  . Pulmonary hypertension ([name]) c/w cor pulmonale   . Protein-calorie malnutrition, severe 09/17/2017  . Constipation   . Hyperparathyroidism, primary ([place]) 07/27/2016  . Loss of appetite 06/02/2016  . COPD GOLD  II   11/22/2015  . Vitamin D deficiency 11/22/2015  . Anxiety and depression 11/22/2015  . Insomnia 11/22/2015  . Hyperlipidemia 11/22/2015  . Essential hypertension 11/22/2015  . Hx of colonic polyps 02/10/2013  . History of colon cancer 06/11/1986   Past Medical History:  Past Medical History:  Diagnosis Date  .  Altered mental status   . Anxiety and depression   . Chronic respiratory failure with hypoxia ([place]) 10/02/2017   Assoc with ? Cor pulmonale dx 09/2017 so rx 02 1-2 lpm 24/7  - 10/17/2017  Walked RA x 3 laps @ 185 ft each stopped due to  End of study, nl pace,  desat to 87 on 3rd lap - 12/19/2017  Saturations on Room Air at Rest =91 % and while Ambulating = 87%  But  on  2 Liters of pulsed oxygen while Ambulating =93% so rec POC 2lpm walking / use at rest if sats under 90%      . Colon cancer ([place]) 1988   Resected  . COPD (chronic obstructive pulmonary disease) ([place])   . Diabetes mellitus without complication ([name])    diet controlled- no meds. per pt  . Diarrhea 04/02/2018  . Diverticulosis   . Hyperlipidemia   . Hypertension   . Hypoxia 10/30/2018  . Insomnia   . Primary hyperparathyroidism ([place])   . SBO (small bowel obstruction) ([place])   . Stroke ([place]) 10/2018   tpa administered  . Tubular adenoma of rectum 02/23/2013   low grade  . Vitamin D deficiency    Past Surgical History:  Past Surgical History:  Procedure Laterality Date  . ABDOMINAL HYSTERECTOMY  08/2015  . BREAST BIOPSY Left   . BREAST EXCISIONAL BIOPSY Right   . COLON RESECTION  1980's  . COLONOSCOPY    . ENDOMETRIAL BIOPSY  2009   negative  . INCONTINENCE SURGERY  2017  . RIGHT HEART CATH N/A 09/20/2017   Procedure: RIGHT HEART CATH;  Surgeon: Larey Dresser, MD;  Location: [facility];  Service: Cardiovascular;  Laterality: N/A;  . RIGHT/LEFT HEART CATH AND CORONARY ANGIOGRAPHY N/A 03/19/2018   Procedure: RIGHT/LEFT HEART CATH AND CORONARY ANGIOGRAPHY;  Surgeon: Larey Dresser, MD;  Location: [facility];  Service: Cardiovascular;  Laterality: N/A;    Assessment / Plan / Recommendation Clinical Impression 73 year old female with past history of diabetes, HTN, hyperlipidemia, colon cancer, COPD,pulmonary HTN, chronic respiratory failure with hypoxia, hyperparathyroidism and systolic CHF with DOE.  Recent admission to hospital for respiratory failure.Presented on 11/04/2018 with sudden onset of left sided facial weakness and left arm weakness. EMS activated. Patient treated with TPA.   CT head revealed right cerebellar infarct s/p TPA with hemorrhagic transformation 8 cc with mass effect. Patient given fibrinogen, and TXA for TPA reversal. Infarct likely embolic secondary to unknown source. MRI showed right cerebellar HT with right MCA small scattered infarcts. MRA unremarkable. Carotid doppler B ICA 1-39% stenosis, VAs antegrade. 2 d echo EF 60 to 65 %. No source embolus. Consider amyloidosis. LE venous dopplers negative for DVT. Will plan LOOP recorder placement 11/6 to rule out Afib. SCDs for VTE prophylaxis. ASA 81 mg daily prior to admit. Now on no antithrombotic due to HT.Repeat CT 11/5 showed stable HT right cerebellar CVA, but new infarct at left cerebellar.To satrt ada 325 mg daily.  HTN urgency treated with cleviprex initially. Home meds of Isosorbide HCTZ, lasix, losartan, metoprolol and spironolactone. BP goals <140 given hemorrhagic transformation.Avoid low BP given left VA high grade stenosis.Home meds resumed except for losartan.Lipitor increased to 20 with LDL 91. To continue statin at discharge. Hgb A1c 7 with no meds pta. CBGS and SSI with close PCP follow up as outpatient. Severe malnutrition due to chronic illness.  Old left frontal lobe infarct noted.   Pt presents with severe-moderate cognitive linguistic impairment from baseline and acute infarcts, deficits include immediate recall, basic auditory comprehension, basic problem solving, intellectual/safety awareness, and sustained attention. Pt denied baseline cognitive deficits and previous infarcts. SLP communicated with pt's husband via phone, he expressed only change is intermittent confusion and pt was responsibilities at home included only basic activities. Pt demonstrated ability to problem solving utilize call bell,  structured 1 step commands in 3/3 trials, 2 step commands in 3/3 trials, 3 step commands in 0/3 trials and was orientated place, situation but not time. Pt demonstrated 7 out 30 on MOCA version 7.3 (n=>26) with severe impairment in problem solving, auditory comprehension (preforming slightly better when pt repeated direction/request), immediate/delayed recall and moderate impairments in sustained attention. Pt demonstrated overall inconsistent abilities. Pt did not demonstrate word finding deficits during simple conversation, ability to name items in room in 5/5 trials, but demonstrated difficulty naming objects in pictures (Cognistat) in 3/8 trials. SLP recommends targeting functional tasks husband present during functional task to establish baseline deficits and continued assessment of language if needed. Pt would benefit from skilled ST services in order to maximize functional independence and reduce burden of care, likely requiring 24 hour supervision and continue ST services.   Skilled Therapeutic Interventions          Skilled ST services focused on cognitive skills. SLP administered  cognitive linguistic assessment, educated pt on results and created plan to address deficits. SLP provided cues for auditory comprehension, having pt repeat commands with lead to increase in ability to follow commands. SLP communicated with pt's husband via phone to aid in baseline establishment. All questions were answered to satisfaction. Pt was left in room with call bell within reach and chair  alarm set. ST recommends to continue skilled ST services.  SLP Assessment  Patient will need skilled Speech Lanaguage Pathology Services during CIR admission    Recommendations  SLP Diet Recommendations: Thin Liquid Administration via: Straw;Cup Medication Administration: Whole meds with liquid Supervision: Patient able to self feed Postural Changes and/or Swallow Maneuvers: Seated upright 90 degrees Oral Care  Recommendations: Oral care BID Patient destination: Home Follow up Recommendations: Home Health SLP;24 hour supervision/assistance Equipment Recommended: None recommended by SLP    SLP Frequency 3 to 5 out of 7 days   SLP Duration  SLP Intensity  SLP Treatment/Interventions 10-14 days  Minumum of 1-2 x/day, 30 to 90 minutes  Cognitive remediation/compensation;Cueing hierarchy;Functional tasks;Internal/external aids;Patient/family education    Pain Pain Assessment Pain Scale: 0-10 Pain Score: 0-No pain  Prior Functioning Cognitive/Linguistic Baseline: Baseline deficits Baseline deficit details: pt relies on husband for cognitive support Type of Home: House  Lives With: Spouse Available Help at Discharge: Family;Available 24 hours/day Education: a few years of college Vocation: Retired  Programmer, systems Overall Cognitive Status: Impaired/Different from baseline Arousal/Alertness: Awake/alert Orientation Level: Oriented to person;Oriented to place;Disoriented to time Attention: Focused;Sustained Focused Attention: Appears intact Sustained Attention: Impaired Sustained Attention Impairment: Functional basic;Verbal basic Memory: Impaired Memory Impairment: Storage deficit;Retrieval deficit Awareness: Impaired Awareness Impairment: Intellectual impairment Problem Solving: Impaired Problem Solving Impairment: Functional basic;Verbal basic Safety/Judgment: Impaired  Comprehension Auditory Comprehension Overall Auditory Comprehension: Impaired Yes/No Questions: Within Functional Limits Commands: Impaired One Step Basic Commands: 75-100% accurate(inconsistent) Two Step Basic Commands: 75-100% accurate(inconsistent) Multistep Basic Commands: 0-24% accurate Conversation: Simple Interfering Components: Attention;Processing speed EffectiveTechniques: Extra processing time Visual Recognition/Discrimination Discrimination: Exceptions to [facility] Common Objects: Able for  objects in room Pictures: Unable to indentify Reading Comprehension Reading Status: Within funtional limits Expression Expression Primary Mode of Expression: Verbal Verbal Expression Overall Verbal Expression: Appears within functional limits for tasks assessed Common Objects: Able for objects in room Pictures: Unable to indentify Oral Motor Oral Motor/Sensory Function Overall Oral Motor/Sensory Function: Within functional limits Motor Speech Overall Motor Speech: Appears within functional limits for tasks assessed Respiration: Within functional limits Phonation: Normal Resonance: Within functional limits Articulation: Within functional limitis Intelligibility: Intelligible Motor Planning: Witnin functional limits Motor Speech Errors: Not applicable   Short Term Goals: Week 1: SLP Short Term Goal 1 (Week 1): Pt will demonstrate ability to follow basic commands during functional tasks with min A verbal cues. SLP Short Term Goal 2 (Week 1): Pt will demonstrate basic problem solving during ADL tasks with min A verbal cues. SLP Short Term Goal 3 (Week 1): Pt will demonstrate intellectual awareness listing 2 phsyical and 2 cognitive deficits with min A verbal cues. SLP Short Term Goal 4 (Week 1): Pt will demonstrate sustained attention in 15 minute intervals during functional task with min A verbal cues. SLP Short Term Goal 5 (Week 1): Pt will demonstrate recall of novel, daily events with mod A verbal cues for compensatory aid.  Refer to Care Plan for Long Term Goals  Recommendations for other services: None   Discharge Criteria: Patient will be discharged from SLP if patient refuses treatment 3 consecutive times without medical reason, if treatment goals not met, if there is a change in medical status, if patient makes no progress towards goals or if patient is discharged from hospital.  The above assessment, treatment plan, treatment alternatives and goals were discussed and  mutually agreed upon: by patient and by family  Yuka Lallier  [facility] 11/09/2018, 4:46 PM

## 2018-11-09 NOTE — Progress Notes (Signed)
[facility] PHYSICAL MEDICINE & REHABILITATION PROGRESS NOTE   Subjective/Complaints:  Slept ok, pt orietned to person and place, Friday not Sat "when can I go home" On chronic O2  ROS- neg CP, SOB, N/V/D Objective:   Ct Head Wo Contrast  Result Date: 11/07/2018 CLINICAL DATA:  Stroke follow-up EXAM: CT HEAD WITHOUT CONTRAST TECHNIQUE: Contiguous axial images were obtained from the base of the skull through the vertex without intravenous contrast. COMPARISON:  CT and brain MRI from 2 days ago FINDINGS: Brain: Unchanged area of hemorrhage in the inferior right cerebellum with regional edema. Measurement is confounded by mixed density from brain parenchyma intervening blood. Local mass effect with fourth ventricular narrowing is stable. New small to moderate superficial left inferior cerebellar infarct. Small cortical infarcts along the right cerebral convexity that are known from comparison MRI. Nonacute small left superior frontal cortex infarct. No hydrocephalus or extra-axial collection. Vascular: Atherosclerosis.  No hyperdense vessel. Skull: Negative Sinuses/Orbits: Negative These results were communicated to at 9:41 amon 11/5/2020by text page via the [facility] messaging system. IMPRESSION: 1. New small to moderate infarct in the lateral left cerebellum. 2. Unchanged appearance of hemorrhagic infarct with swelling in the inferior right cerebellum. No hydrocephalus. 3. Unchanged appearance of nonhemorrhagic small cortical infarcts along the right cerebral convexity. Electronically Signed   By: Monte Fantasia M.D.   On: 11/07/2018 09:42   Recent Labs    11/07/18 0504 11/08/18 0258  WBC 5.3 4.8  HGB 11.9* 11.3*  HCT 37.7 35.9*  PLT 219 211   Recent Labs    11/07/18 0504 11/08/18 0258  NA 142 141  K 3.0* 3.2*  CL 99 100  CO2 33* 33*  GLUCOSE 128* 177*  BUN 22 22  CREATININE 1.17* 1.15*  CALCIUM 10.7* 10.4*   No intake or output data in the 24 hours ending 11/09/18 0510   Physical  Exam: Vital Signs Blood pressure 123/73, pulse 69, temperature 97.7 F (36.5 C), temperature source Oral, resp. rate 20, height _0  (1.676 m), weight 62.9 kg, SpO2 97 %.   General: No acute distress Mood and affect are appropriate Heart: Regular rate and rhythm no rubs murmurs or extra sounds Lungs: Clear to auscultation, breathing unlabored, no rales or wheezes Abdomen: Positive bowel sounds, soft nontender to palpation, nondistended Extremities: No clubbing, cyanosis, or edema Skin: No evidence of breakdown, no evidence of rash Neurologic: Cranial nerves II through XII intact, motor strength is 5/5 in bilateral deltoid, bicep, tricep, grip, hip flexor, knee extensors, ankle dorsiflexor and plantar flexor Sensory exam normal sensation to light touch and proprioception in bilateral upper and lower extremities Cerebellar exam Mild dysmetria Left FNF, could not cooperate with Heel SHin testing  Musculoskeletal: Full range of motion in all 4 extremities. No joint swelling   Assessment/Plan: 1. Functional deficits secondary to Right cerebellar ICH and Left cerebellar infarct  which require 3+ hours per day of interdisciplinary therapy in a comprehensive inpatient rehab setting.  Physiatrist is providing close team supervision and 24 hour management of active medical problems listed below.  Physiatrist and rehab team continue to assess barriers to discharge/monitor patient progress toward functional and medical goals  Care Tool:  Bathing              Bathing assist       Upper Body Dressing/Undressing Upper body dressing        Upper body assist      Lower Body Dressing/Undressing Lower body dressing  Lower body assist       Toileting Toileting    Toileting assist Assist for toileting: Minimal Assistance - Patient > 75%     Transfers Chair/bed transfer  Transfers assist           Locomotion Ambulation   Ambulation assist               Walk 10 feet activity   Assist           Walk 50 feet activity   Assist           Walk 150 feet activity   Assist           Walk 10 feet on uneven surface  activity   Assist           Wheelchair     Assist               Wheelchair 50 feet with 2 turns activity    Assist            Wheelchair 150 feet activity     Assist          Blood pressure 123/73, pulse 69, temperature 97.7 F (36.5 C), temperature source Oral, resp. rate 20, height _0  (1.676 m), weight 62.9 kg, SpO2 97 %.  Medical Problem List and Plan: 1.Impaired fait and mobilitysecondary to acute ischemic stroke with hemorrhagic transformation. CIR evals today  2. Antithrombotics: -DVT/anticoagulation:Mechanical:Sequential compression devices, below kneeBilateral lower extremitiesBLE dopplers negative on 11/4 -antiplatelet therapy: ASA 325 mg daily 3.Headaches/Pain Management:Tylenol prn 4. Mood:LCSW to follow for evaluation and support.She has a history of anxiety and depression. -antipsychotic agents: N/A 5. Neuropsych: This patientiscapable of making decisions on herown behalf. 6. Skin/Wound Care:Keep incision C/D for 3 days. Monitor wound for healing. 7. Fluids/Electrolytes/Nutrition:Maintain adequate nutritional and hydration status.She has severe protein calorie nutrition and is currently on Mirtazepine 24m HS to stimulate appetite. 8. HTN: Monitor BP tid and avoid hypotension due to high grade L-VA stenosis.BP goals <160 due to cerebellar bleed. Currently on Lasix, aldactone and Imdur--metoprolol and Losartan on hold at this time. 9. T2DM: Hgb A1c- 7.0. Monitor BS ac/hs with SSIfor now.Was on glucotrol 2.5 mg daily prior to last admission. CBG (last 3)  Recent Labs    11/08/18 0754 11/08/18 1214 11/08/18 1609  GLUCAP 111* 173* 176*  fair control monitor 10. COPD:4 litersOxygen  dependent.Respiratory status stable on Incruse and Breo daily.  11. Chronic systolic CHF: Monitor for signs of overload and check weight daily. Continue spironolactone, Imdur, Lipitor, ASA and lasix daily.  12. Hypokalemia: Will likely need daily supplement as still low.Will add kdur bid--check labs in am. 13. ABLA: Recheck CBC in am.  14. CKD?: BUN/SCr - 31/1.36 at admission likely due to recent diuresis. Continue to monitor with serial checks. 15. Dysuria with incontinence: Will check UA.[facility]   LOS: 1 days A FACE TO FACE EVALUATION WAS PERFORMED  ACharlett Blake11/07/2018, 5:10 AM

## 2018-11-09 NOTE — Evaluation (Addendum)
Occupational Therapy Assessment and Plan  Patient Details  Name: Denise [name]. Bauers MRN: [id number] Date of Birth: June 06, 1945  OT Diagnosis: cognitive deficits, hemiplegia affecting non-dominant side and muscle weakness (generalized) Rehab Potential: Rehab Potential (ACUTE ONLY): Good ELOS: 10-14   Today's Date: 11/09/2018 OT Individual Time: 1100-1200 OT Individual Time Calculation (min): 60 min     Problem List:  Patient Active Problem List   Diagnosis Date Noted  . ICH (intracerebral hemorrhage) ([place]) s/p tPA administration 11/08/2018  . Hypertensive urgency 11/08/2018  . Cerebellar stroke, acute ([place]) 11/08/2018  . Acute ischemic stroke ([place]) 11/04/2018  . COPD exacerbation ([place]) 10/28/2018  . Acute on chronic systolic CHF (congestive heart failure) ([place]) 10/28/2018  . Elevated troponin 10/28/2018  . Lactic acidosis 10/28/2018  . GERD (gastroesophageal reflux disease) 10/28/2018  . Acute on chronic respiratory failure with hypercapnia ([place]) 10/28/2018  . Diabetes mellitus without complication-diet controled 10/28/2018  . On home oxygen therapy 07/12/2018  . CHF (congestive heart failure) ([place]) 05/16/2018  . Chronic respiratory failure with hypoxia and hypercapnia ([place]) 05/03/2018  . Borderline abnormal TFTs 05/02/2018  . Type 2 diabetes mellitus ([place]) 04/26/2018  . Unintended weight loss 04/26/2018  . Lower extremity edema 02/20/2018  . Chronic respiratory failure with hypoxia ([place]) 10/02/2017  . Hematuria 09/27/2017  . Pulmonary hypertension ([name]) c/w cor pulmonale   . Protein-calorie malnutrition, severe 09/17/2017  . Constipation   . Hyperparathyroidism, primary ([place]) 07/27/2016  . Loss of appetite 06/02/2016  . COPD GOLD  II   11/22/2015  . Vitamin D deficiency 11/22/2015  . Anxiety and depression 11/22/2015  . Insomnia 11/22/2015  . Hyperlipidemia 11/22/2015  . Essential hypertension 11/22/2015  . Hx of colonic polyps 02/10/2013  . History of colon cancer  06/11/1986    Past Medical History:  Past Medical History:  Diagnosis Date  . Altered mental status   . Anxiety and depression   . Chronic respiratory failure with hypoxia ([place]) 10/02/2017   Assoc with ? Cor pulmonale dx 09/2017 so rx 02 1-2 lpm 24/7  - 10/17/2017  Walked RA x 3 laps @ 185 ft each stopped due to  End of study, nl pace,  desat to 87 on 3rd lap - 12/19/2017  Saturations on Room Air at Rest =91 % and while Ambulating = 87%  But  on  2 Liters of pulsed oxygen while Ambulating =93% so rec POC 2lpm walking / use at rest if sats under 90%      . Colon cancer ([place]) 1988   Resected  . COPD (chronic obstructive pulmonary disease) ([place])   . Diabetes mellitus without complication ([name])    diet controlled- no meds. per pt  . Diarrhea 04/02/2018  . Diverticulosis   . Hyperlipidemia   . Hypertension   . Hypoxia 10/30/2018  . Insomnia   . Primary hyperparathyroidism ([place])   . SBO (small bowel obstruction) ([place])   . Stroke ([place]) 10/2018   tpa administered  . Tubular adenoma of rectum 02/23/2013   low grade  . Vitamin D deficiency    Past Surgical History:  Past Surgical History:  Procedure Laterality Date  . ABDOMINAL HYSTERECTOMY  08/2015  . BREAST BIOPSY Left   . BREAST EXCISIONAL BIOPSY Right   . COLON RESECTION  1980's  . COLONOSCOPY    . ENDOMETRIAL BIOPSY  2009   negative  . INCONTINENCE SURGERY  2017  . RIGHT HEART CATH N/A 09/20/2017   Procedure: RIGHT HEART CATH;  Surgeon: Larey Dresser, MD;  Location: [facility];  Service: Cardiovascular;  Laterality: N/A;  . RIGHT/LEFT HEART CATH AND CORONARY ANGIOGRAPHY N/A 03/19/2018   Procedure: RIGHT/LEFT HEART CATH AND CORONARY ANGIOGRAPHY;  Surgeon: Larey Dresser, MD;  Location: [facility];  Service: Cardiovascular;  Laterality: N/A;    Assessment & Plan Clinical Impression:  Gloris A. Mehan is a 73 y.o. female past medical history of diabetes, hypertension, hyperlipidemia, colon cancer, COPD,  chronic respiratory failure with hypoxia, systolic CHF with DOE admitted with sudden onset of left-sided weakness.  Found to have R cerebellar infarct s/p tPA w/ hemorrhagic transformation  Patient currently requires min with basic self-care skills secondary to muscle weakness, decreased cardiorespiratoy endurance and decreased oxygen support, decreased coordination, decreased awareness, decreased problem solving, decreased safety awareness and decreased memory and decreased standing balance, decreased postural control, hemiplegia and decreased balance strategies.  Prior to hospitalization, patient could complete BADL/IADL with supervision.  Patient will benefit from skilled intervention to decrease level of assist with basic self-care skills and increase independence with basic self-care skills prior to discharge home with care partner.  Anticipate patient will require 24 hour supervision and follow up home health.  OT - End of Session Activity Tolerance: Tolerates < 10 min activity with changes in vital signs Endurance Deficit: Yes OT Assessment Rehab Potential (ACUTE ONLY): Good OT Barriers to Discharge: Medical stability OT Patient demonstrates impairments in the following area(s): Balance;Cognition;Endurance;Motor;Safety OT Basic ADL's Functional Problem(s): Grooming;Bathing;Dressing;Toileting OT Transfers Functional Problem(s): Toilet;Tub/Shower OT Additional Impairment(s): Fuctional Use of Upper Extremity OT Plan OT Intensity: Minimum of 1-2 x/day, 45 to 90 minutes OT Frequency: 5 out of 7 days OT Duration/Estimated Length of Stay: 10-14 OT Treatment/Interventions: Balance/vestibular training;Discharge planning;Pain management;Self Care/advanced ADL retraining;Therapeutic Activities;UE/LE Coordination activities;Cognitive remediation/compensation;Disease mangement/prevention;Functional mobility training;Patient/family education;Skin care/wound managment;Therapeutic  Exercise;Visual/perceptual remediation/compensation;Community reintegration;DME/adaptive equipment instruction;Neuromuscular re-education;Psychosocial support;Splinting/orthotics;UE/LE Strength taining/ROM;Wheelchair propulsion/positioning OT Self Feeding Anticipated Outcome(s): S OT Basic Self-Care Anticipated Outcome(s): S OT Toileting Anticipated Outcome(s): S OT Bathroom Transfers Anticipated Outcome(s): S OT Recommendation Patient destination: Home Follow Up Recommendations: Home health OT Equipment Recommended: To be determined;Tub/shower bench   Skilled Therapeutic Intervention 1:1. Pt and husband present for session and educated on OT role/purpose, CIR, ELOS and POC. Pt reporting need to use bathroom. Pt ambulates with no AD and MIN A overall to transfer onto toilet with supplemental O2 on 4L (as it is at home). Pt requires VC for CM prior to and after toileting but completes 3/3 components with MIN A in standing. Pt stands ot wash hands with VC for using soap. Pt vitals assessed at 161/100 and rechecked on other arm at 171/104. Pt returned to bed with HOB elevated and RN alerted. Vitals rechecked while waiting for alarm on 168/110. O2 around 85% and increased to 9L with VC for pursed lip breathing and slowly increases to 99%. RN uses manual cuff and BP measured at 161/90. Pt sits EOB to don shirt and pants over 10 min perior with significantly increased rest, however O2 drops to 88% on 5L O2 and BP increased to 178/112. Pt returned to supine and RN alerted. O2 increases to 94% at rest in supine. Exited session with pt in bed, RN in room and call light in reach/exit alarm on  OT Evaluation Precautions/Restrictions  Precautions Precautions: Fall;Other (comment) Precaution Comments: watch O2 sats and BP Restrictions Weight Bearing Restrictions: No General Chart Reviewed: Yes Family/Caregiver Present: Yes Vital Signs Therapy Vitals Pulse Rate: 70 Resp: 19 BP: (!) 154/99 Patient  Position (if appropriate):  Sitting Oxygen Therapy SpO2: 99 % O2 Device: Nasal Cannula O2 Flow Rate (L/min): 5 L/min Pain Pain Assessment Pain Scale: 0-10 Pain Score: 0-No pain Home Living/Prior Functioning Home Living Family/patient expects to be discharged to:: Private residence Living Arrangements: Spouse/significant other Available Help at Discharge: Family, Available 24 hours/day Type of Home: House Home Access: Stairs to enter [employer] of Steps: 4 Entrance Stairs-Rails: Right Home Layout: One level, Laundry or work area in basement [employer] Shower/Tub: Public librarian, Architectural technologist: Standard Bathroom Accessibility: Yes Additional Comments: home O2; home info taken with PT collateral who spoke with husband. Pt able to recall most information  Lives With: Spouse Prior Function Vocation: Retired Comments: reports not using walker ADL ADL Eating: Moderate assistance Grooming: Not assessed Upper Body Bathing: Not assessed Lower Body Bathing: Not assessed Upper Body Dressing: Supervision/safety Where Assessed-Upper Body Dressing: Edge of bed Lower Body Dressing: Minimal assistance Where Assessed-Lower Body Dressing: Edge of bed Toileting: Minimal assistance, Maximal cueing Where Assessed-Toileting: Glass blower/designer: Psychiatric nurse Method: Counselling psychologist: Grab bars Vision Baseline Vision/History: Wears glasses Wears Glasses: At all times Patient Visual Report: No change from baseline Vision Assessment?: No apparent visual deficits Perception  Perception: Within Functional Limits Praxis Praxis: Intact Cognition Overall Cognitive Status: Impaired/Different from baseline Arousal/Alertness: Awake/alert Orientation Level: Person;Place;Situation Person: Oriented Place: Oriented Situation: Oriented Year: 2020 Month: November Day of Week: Incorrect Memory: Impaired Selective Attention: Appears  intact Awareness: Impaired Safety/Judgment: Impaired  Immediate and delayed recall 3/3 words for BIMS Sensation Sensation Light Touch: Appears Intact Coordination Gross Motor Movements are Fluid and Coordinated: Yes Fine Motor Movements are Fluid and Coordinated: No Motor  Motor Motor: Hemiplegia;Abnormal postural alignment and control Mobility  Bed Mobility Bed Mobility: Sit to Supine;Supine to Sit Supine to Sit: Supervision/Verbal cueing Sit to Supine: Supervision/Verbal cueing Transfers Sit to Stand: Minimal Assistance - Patient > 75% Stand to Sit: Minimal Assistance - Patient > 75%  Trunk/Postural Assessment  Cervical Assessment Cervical Assessment: (head forward) Thoracic Assessment Thoracic Assessment: (rounded shoulders) Lumbar Assessment Lumbar Assessment: (post pelvic tilt) Postural Control Postural Control: Deficits on evaluation(insufficient)  Balance Balance Balance Assessed: Yes Dynamic Sitting Balance Dynamic Sitting - Level of Assistance: 5: Stand by assistance Sitting balance - Comments: seated on toilet with no UE support Dynamic Standing Balance Dynamic Standing - Level of Assistance: 4: Min assist Extremity/Trunk Assessment RUE Assessment RUE Assessment: Within Functional Limits LUE Assessment LUE Assessment: Exceptions to [facility] General Strength Comments: generalized weakness and decreased coordination LUE Body System: Neuro Brunstrum levels for arm and hand: Arm;Hand Brunstrum level for arm: Stage V Relative Independence from Synergy Brunstrum level for hand: Stage VI Isolated joint movements     Refer to Care Plan for Long Term Goals  Recommendations for other services: Therapeutic Recreation  Pet therapy, Stress management and Outing/community reintegration   Discharge Criteria: Patient will be discharged from OT if patient refuses treatment 3 consecutive times without medical reason, if treatment goals not met, if there is a change in  medical status, if patient makes no progress towards goals or if patient is discharged from hospital.  The above assessment, treatment plan, treatment alternatives and goals were discussed and mutually agreed upon: by patient and by family  Tonny Branch 11/09/2018, 12:27 PM

## 2018-11-10 MED ORDER — FLUCONAZOLE 50 MG PO TABS
50.0000 mg | ORAL_TABLET | Freq: Every day | ORAL | Status: AC
  Administered 2018-11-10 – 2018-11-16 (×7): 50 mg via ORAL
  Filled 2018-11-10 (×7): qty 1

## 2018-11-10 NOTE — Progress Notes (Signed)
[facility] PHYSICAL MEDICINE & REHABILITATION PROGRESS NOTE   Subjective/Complaints:  No dysuria, wants to go home soon  ROS- neg CP, SOB, N/V/D Objective:   No results found. Recent Labs    11/08/18 0258 11/09/18 0443  WBC 4.8 5.1  HGB 11.3* 11.4*  HCT 35.9* 35.9*  PLT 211 193   Recent Labs    11/08/18 0258 11/09/18 0443  NA 141 143  K 3.2* 3.9  CL 100 103  CO2 33* 31  GLUCOSE 177* 143*  BUN 22 23  CREATININE 1.15* 1.19*  CALCIUM 10.4* 10.4*    Intake/Output Summary (Last 24 hours) at 11/10/2018 0715 Last data filed at 11/09/2018 2110 Gross per 24 hour  Intake 655 ml  Output -  Net 655 ml     Physical Exam: Vital Signs Blood pressure 125/77, pulse 73, temperature 98.7 F (37.1 C), temperature source Oral, resp. rate 18, height _0  (1.676 m), weight 48.1 kg, SpO2 100 %.   General: No acute distress Mood and affect are appropriate Heart: Regular rate and rhythm no rubs murmurs or extra sounds Lungs: Clear to auscultation, breathing unlabored, no rales or wheezes Abdomen: Positive bowel sounds, soft nontender to palpation, nondistended Extremities: No clubbing, cyanosis, or edema Skin: No evidence of breakdown, no evidence of rash Neurologic: Cranial nerves II through XII intact, motor strength is 5/5 in bilateral deltoid, bicep, tricep, grip, hip flexor, knee extensors, ankle dorsiflexor and plantar flexor Sensory exam normal sensation to light touch and proprioception in bilateral upper and lower extremities Cerebellar exam Mild dysmetria Left FNF, could not cooperate with Heel SHin testing  Musculoskeletal: Full range of motion in all 4 extremities. No joint swelling   Assessment/Plan: 1. Functional deficits secondary to Right cerebellar ICH and Left cerebellar infarct  which require 3+ hours per day of interdisciplinary therapy in a comprehensive inpatient rehab setting.  Physiatrist is providing close team supervision and 24 hour management of  active medical problems listed below.  Physiatrist and rehab team continue to assess barriers to discharge/monitor patient progress toward functional and medical goals  Care Tool:  Bathing  Bathing activity did not occur: Safety/medical concerns(BP elevated and O2 low)           Bathing assist       Upper Body Dressing/Undressing Upper body dressing   What is the patient wearing?: Pull over shirt    Upper body assist Assist Level: Supervision/Verbal cueing    Lower Body Dressing/Undressing Lower body dressing      What is the patient wearing?: Pants     Lower body assist Assist for lower body dressing: Minimal Assistance - Patient > 75%     Toileting Toileting    Toileting assist Assist for toileting: Minimal Assistance - Patient > 75%     Transfers Chair/bed transfer  Transfers assist     Chair/bed transfer assist level: Independent     Locomotion Ambulation   Ambulation assist      Assist level: Minimal Assistance - Patient > 75% Assistive device: No Device Max distance: 175f   Walk 10 feet activity   Assist     Assist level: Minimal Assistance - Patient > 75% Assistive device: No Device   Walk 50 feet activity   Assist    Assist level: Minimal Assistance - Patient > 75% Assistive device: No Device    Walk 150 feet activity   Assist Walk 150 feet activity did not occur: Safety/medical concerns  Walk 10 feet on uneven surface  activity   Assist     Assist level: Minimal Assistance - Patient > 75%(up/down ramp) Assistive device: Other (comment)(None)   Wheelchair     Assist Will patient use wheelchair at discharge?: No(TBD but do not anticipate)             Wheelchair 50 feet with 2 turns activity    Assist            Wheelchair 150 feet activity     Assist          Blood pressure 125/77, pulse 73, temperature 98.7 F (37.1 C), temperature source Oral, resp. rate 18, height 5'  6" (1.676 m), weight 48.1 kg, SpO2 100 %.  Medical Problem List and Plan: 1.Impaired fait and mobilitysecondary to acute ischemic stroke with hemorrhagic transformation. CIR PT, OT, amb ~100' minA  2. Antithrombotics: -DVT/anticoagulation:Mechanical:Sequential compression devices, below kneeBilateral lower extremitiesBLE dopplers negative on 11/4 -antiplatelet therapy: ASA 325 mg daily 3.Headaches/Pain Management:Tylenol prn 4. Mood:LCSW to follow for evaluation and support.She has a history of anxiety and depression. -antipsychotic agents: N/A 5. Neuropsych: This patientiscapable of making decisions on herown behalf. 6. Skin/Wound Care:Keep incision C/D for 3 days. Monitor wound for healing. 7. Fluids/Electrolytes/Nutrition:Maintain adequate nutritional and hydration status.She has severe protein calorie nutrition and is currently on Mirtazepine 40m HS to stimulate appetite. 8. HTN: Monitor BP tid and avoid hypotension due to high grade L-VA stenosis.BP goals <160 due to cerebellar bleed. Currently on Lasix, aldactone and Imdur--metoprolol and Losartan on hold at this time. 9. T2DM: Hgb A1c- 7.0. Monitor BS ac/hs with SSIfor now.Was on glucotrol 2.5 mg daily prior to last admission. CBG (last 3)  Recent Labs    11/08/18 0754 11/08/18 1214 11/08/18 1609  GLUCAP 111* 173* 176*  fair control monitor 10. COPD:4 litersOxygen dependent.Respiratory status stable on Incruse and Breo daily.  11. Chronic systolic CHF: Monitor for signs of overload and check weight daily. Continue spironolactone, Imdur, Lipitor, ASA and lasix daily.  12. Hypokalemia: Will likely need daily supplement as still low.Will add kdur bid--check labs in am. 13. ABLA: Recheck CBC in am.  14. CKD?: BUN/SCr - 31/1.36 at admission likely due to recent diuresis. Continue to monitor with serial checks. 15. Dysuria with incontinence: UA 50WBC , +yeast , neg  bacteria, start diflucan   LOS: 2 days A FACE TO FACE EVALUATION WAS PERFORMED  ACharlett Blake11/08/2018, 7:15 AM

## 2018-11-11 ENCOUNTER — Inpatient Hospital Stay (HOSPITAL_COMMUNITY): Payer: Medicare HMO

## 2018-11-11 ENCOUNTER — Encounter (HOSPITAL_COMMUNITY): Payer: Medicare HMO

## 2018-11-11 ENCOUNTER — Encounter (HOSPITAL_COMMUNITY): Payer: Self-pay | Admitting: Cardiology

## 2018-11-11 DIAGNOSIS — Z789 Other specified health status: Secondary | ICD-10-CM

## 2018-11-11 DIAGNOSIS — N179 Acute kidney failure, unspecified: Secondary | ICD-10-CM

## 2018-11-11 DIAGNOSIS — Z7409 Other reduced mobility: Secondary | ICD-10-CM

## 2018-11-11 LAB — BASIC METABOLIC PANEL
Anion gap: 9 (ref 5–15)
BUN: 15 mg/dL (ref 8–23)
CO2: 31 mmol/L (ref 22–32)
Calcium: 10.6 mg/dL — ABNORMAL HIGH (ref 8.9–10.3)
Chloride: 103 mmol/L (ref 98–111)
Creatinine, Ser: 1.19 mg/dL — ABNORMAL HIGH (ref 0.44–1.00)
GFR calc Af Amer: 52 mL/min — ABNORMAL LOW (ref >60)
GFR calc non Af Amer: 45 mL/min — ABNORMAL LOW (ref >60)
Glucose, Bld: 117 mg/dL — ABNORMAL HIGH (ref 70–99)
Potassium: 4.6 mmol/L (ref 3.5–5.1)
Sodium: 143 mmol/L (ref 135–145)

## 2018-11-11 LAB — CBC
HCT: 36.6 % (ref 36.0–46.0)
Hemoglobin: 11.5 g/dL — ABNORMAL LOW (ref 12.0–15.0)
MCH: 26 pg (ref 26.0–34.0)
MCHC: 31.4 g/dL (ref 30.0–36.0)
MCV: 82.8 fL (ref 80.0–100.0)
Platelets: 158 10*3/uL (ref 150–400)
RBC: 4.42 MIL/uL (ref 3.87–5.11)
RDW: 15.3 % (ref 11.5–15.5)
WBC: 5.1 10*3/uL (ref 4.0–10.5)
nRBC: 0 % (ref 0.0–0.2)

## 2018-11-11 LAB — GLUCOSE, CAPILLARY
Glucose-Capillary: 109 mg/dL — ABNORMAL HIGH (ref 70–99)
Glucose-Capillary: 115 mg/dL — ABNORMAL HIGH (ref 70–99)
Glucose-Capillary: 222 mg/dL — ABNORMAL HIGH (ref 70–99)
Glucose-Capillary: 90 mg/dL (ref 70–99)
Glucose-Capillary: 113 mg/dL — ABNORMAL HIGH (ref 70–99)
Glucose-Capillary: 263 mg/dL — ABNORMAL HIGH (ref 70–99)
Glucose-Capillary: 70 mg/dL (ref 70–99)
Glucose-Capillary: 97 mg/dL (ref 70–99)
Glucose-Capillary: 158 mg/dL — ABNORMAL HIGH (ref 70–99)

## 2018-11-11 LAB — URINE CULTURE: Culture: 20000 — AB

## 2018-11-11 MED ORDER — ENSURE ENLIVE PO LIQD
237.0000 mL | Freq: Three times a day (TID) | ORAL | Status: DC
  Administered 2018-11-12 – 2018-11-19 (×18): 237 mL via ORAL

## 2018-11-11 NOTE — Plan of Care (Signed)
  Problem: RH Expression Communication Goal: LTG Patient will increase word finding of common (SLP) Description: LTG:  Patient will increase word finding of common objects/daily info/abstract thoughts with cues using compensatory strategies (SLP). Flowsheets (Taken 11/11/2018 1536) LTG: Patient will increase word finding of common (SLP): Minimal Assistance - Patient > 75% Patient will use compensatory strategies to increase word finding of: (pictures) Common objects

## 2018-11-11 NOTE — Care Management Note (Signed)
[facility] Individual Statement of Services  Patient Name:  Denise [name]  Date:  11/11/2018  Welcome to the [facility].  Our goal is to provide you with an individualized program based on your diagnosis and situation, designed to meet your specific needs.  With this comprehensive rehabilitation program, you will be expected to participate in at least 3 hours of rehabilitation therapies Monday-Friday, with modified therapy programming on the weekends.  Your rehabilitation program will include the following services:  Physical Therapy (PT), Occupational Therapy (OT), Speech Therapy (ST), 24 hour per day rehabilitation nursing, Case Management (Social Worker), Rehabilitation Medicine, Nutrition Services and Pharmacy Services  Weekly team conferences will be held on Wednesday to discuss your progress.  Your Social Worker will talk with you frequently to get your input and to update you on team discussions.  Team conferences with you and your family in attendance may also be held.  Expected length of stay: 10-14 days  Overall anticipated outcome: supervision with cueing  Depending on your progress and recovery, your program may change. Your Social Worker will coordinate services and will keep you informed of any changes. Your Social Worker's name and contact numbers are listed  below.  The following services may also be recommended but are not provided by the [facility]:  [place] will be made to provide these services after discharge if needed.  Arrangements include referral to agencies that provide these services.  Your insurance has been verified to be:  [employer] Your primary doctor is:  Alma Friendly  Pertinent information will be shared with your doctor and your insurance company.  Social Worker:  Ovidio Kin, [place] or  [phone]  Information discussed with and copy given to patient by: Elease Hashimoto, 11/11/2018, 10:02 AM

## 2018-11-11 NOTE — Progress Notes (Signed)
Social Work Assessment and Plan   Patient Details  Name: Denise [name] MRN: [id number] Date of Birth: 09-28-1945  Today's Date: 11/11/2018  Problem List:  Patient Active Problem List   Diagnosis Date Noted  . ICH (intracerebral hemorrhage) ([place]) s/p tPA administration 11/08/2018  . Hypertensive urgency 11/08/2018  . Cerebellar stroke, acute ([place]) 11/08/2018  . Acute ischemic stroke ([place]) 11/04/2018  . COPD exacerbation ([place]) 10/28/2018  . Acute on chronic systolic CHF (congestive heart failure) ([place]) 10/28/2018  . Elevated troponin 10/28/2018  . Lactic acidosis 10/28/2018  . GERD (gastroesophageal reflux disease) 10/28/2018  . Acute on chronic respiratory failure with hypercapnia ([place]) 10/28/2018  . Diabetes mellitus without complication-diet controled 10/28/2018  . On home oxygen therapy 07/12/2018  . CHF (congestive heart failure) ([place]) 05/16/2018  . Chronic respiratory failure with hypoxia and hypercapnia ([place]) 05/03/2018  . Borderline abnormal TFTs 05/02/2018  . Type 2 diabetes mellitus ([place]) 04/26/2018  . Unintended weight loss 04/26/2018  . Lower extremity edema 02/20/2018  . Chronic respiratory failure with hypoxia ([place]) 10/02/2017  . Hematuria 09/27/2017  . Pulmonary hypertension ([name]) c/w cor pulmonale   . Protein-calorie malnutrition, severe 09/17/2017  . Constipation   . Hyperparathyroidism, primary ([place]) 07/27/2016  . Loss of appetite 06/02/2016  . COPD GOLD  II   11/22/2015  . Vitamin D deficiency 11/22/2015  . Anxiety and depression 11/22/2015  . Insomnia 11/22/2015  . Hyperlipidemia 11/22/2015  . Essential hypertension 11/22/2015  . Hx of colonic polyps 02/10/2013  . History of colon cancer 06/11/1986   Past Medical History:  Past Medical History:  Diagnosis Date  . Altered mental status   . Anxiety and depression   . Chronic respiratory failure with hypoxia ([place]) 10/02/2017   Assoc with ? Cor pulmonale dx 09/2017 so rx 02 1-2 lpm 24/7  -  10/17/2017  Walked RA x 3 laps @ 185 ft each stopped due to  End of study, nl pace,  desat to 87 on 3rd lap - 12/19/2017  Saturations on Room Air at Rest =91 % and while Ambulating = 87%  But  on  2 Liters of pulsed oxygen while Ambulating =93% so rec POC 2lpm walking / use at rest if sats under 90%      . Colon cancer ([place]) 1988   Resected  . COPD (chronic obstructive pulmonary disease) ([place])   . Diabetes mellitus without complication ([name])    diet controlled- no meds. per pt  . Diarrhea 04/02/2018  . Diverticulosis   . Hyperlipidemia   . Hypertension   . Hypoxia 10/30/2018  . Insomnia   . Primary hyperparathyroidism ([place])   . SBO (small bowel obstruction) ([place])   . Stroke ([place]) 10/2018   tpa administered  . Tubular adenoma of rectum 02/23/2013   low grade  . Vitamin D deficiency    Past Surgical History:  Past Surgical History:  Procedure Laterality Date  . ABDOMINAL HYSTERECTOMY  08/2015  . BREAST BIOPSY Left   . BREAST EXCISIONAL BIOPSY Right   . COLON RESECTION  1980's  . COLONOSCOPY    . ENDOMETRIAL BIOPSY  2009   negative  . INCONTINENCE SURGERY  2017  . LOOP RECORDER INSERTION N/A 11/08/2018   Procedure: LOOP RECORDER INSERTION;  Surgeon: Constance Haw, MD;  Location: [facility];  Service: Cardiovascular;  Laterality: N/A;  . RIGHT HEART CATH N/A 09/20/2017   Procedure: RIGHT HEART CATH;  Surgeon: Larey Dresser, MD;  Location: [facility];  Service: Cardiovascular;  Laterality: N/A;  . RIGHT/LEFT HEART CATH AND CORONARY ANGIOGRAPHY N/A 03/19/2018   Procedure: RIGHT/LEFT HEART CATH AND CORONARY ANGIOGRAPHY;  Surgeon: Larey Dresser, MD;  Location: [facility];  Service: Cardiovascular;  Laterality: N/A;   Social History:  reports that she quit smoking about 31 years ago. Her smoking use included cigarettes. She has a 30.00 pack-year smoking history. She has never used smokeless tobacco. She reports that she does not drink alcohol or use  drugs.  Family / Support Systems Marital Status: Married Patient Roles: Spouse, Parent, Other (Comment)(sibling) Spouse/Significant Other: Denise [name] [phone] Children: Two local daughter's Other Supports: Financial trader [phone]  [phone] Anticipated Caregiver: Husband Ability/Limitations of Caregiver: No limitations in good health Caregiver Availability: 24/7 Family Dynamics: Close knit family pt, husband and their children-two of their daughter's are local and involved, but have not visited [name] since [place]. Her sister is involved and will assist if needed. They have friends and church members who visit  Social History Preferred language: English Religion: Christian Cultural Background: No issues Education: HS Read: Yes Write: Yes Employment Status: Retired Public relations account executive Issues: No issues Guardian/Conservator: None-according to MD pt is capable of making her own decisions, but husband plans on being here daily to provide support also.   Abuse/Neglect Abuse/Neglect Assessment Can Be Completed: Yes Physical Abuse: Denies Verbal Abuse: Denies Sexual Abuse: Denies Exploitation of patient/patient's resources: Denies Self-Neglect: Denies  Emotional Status [name] affect, behavior and adjustment status: Pt is resting trying to catch her breath after her PT session. She is wanting to do well here and get home quickly. She is tired of being in a hospital, she has been in and out of one for the past two months. Her husband voiced she is not a patient person and likes to be at home. Recent Psychosocial Issues: other health issues Psychiatric History: History of anxiety and depression-does take medications for this and finds them helpful. Would benefit from seeing neuro-psych while here due to multiplle medical issues and been back and forth in the hospital past few months. Substance Abuse History: Remote history of tobacco  Patient / Family Perceptions,  Expectations & Goals Pt/Family understanding of illness & functional limitations: Pt and husband can explain her stroke and deficits. She feels she is more deconditioned than affects of ther stroke. She is glad to be improving and feels she will be home soon. Her husband is supportive and talks with the MD also, he feels he has a good understanding of her treatment plan while here. Premorbid pt/family roles/activities: Wife, mom, grandmother, friend, sister, church member, etc Anticipated changes in roles/activities/participation: resume Pt/family expectations/goals: Pt states: " I want to be home soon, I am tired of being in a hospital."  Husband states: " I can see she is doing better and know she wants to go home as soon as possible."  [employer]: Other (Comment) Premorbid Home Care/DME Agencies: Other (Comment)(has had [facility] in the past and has rw and 3 in1) Transportation available at discharge: Husband-pt doesn't drive Resource referrals recommended: Neuropsychology, Support group (specify)  Discharge Planning Living Arrangements: Spouse/significant other Support Systems: Spouse/significant other, Children, Other relatives, Friends/neighbors, Church/faith community Type of Residence: Private residence Insurance Resources: Multimedia programmer (specify)([employer] Medicare) Museum/gallery curator Resources: Fish farm manager, Family Support Financial Screen Referred: No Living Expenses: Lives with family Money Management: Patient, Spouse Does the patient have any problems obtaining your medications?: No Home Management: Both husband does the majority of the household due to  pt's breathng issues Patient/Family Preliminary Plans: Return home with husband who is able to assist if needed. Pt hopes to be mod/i with her walker like she was prior to admission. She feels once home she can pase herself and do what she feels she can do instead of being told when and how to do things. Aware  team evaluated and set supervision level goals. Social Work Anticipated Follow Up Needs: HH/OP, Support Group  Clinical Impression Frail looking female who has ben in and out of the hospital multiple times in the past few months and is ready to be at home. Her husband is involved and supportive and will assist her at discharge if needed. With pt's history of anxiety and depression may benefit from seeing neuo-psych while here. Will work on discharge needs.  Elease Hashimoto 11/11/2018, 10:48 AM

## 2018-11-11 NOTE — Progress Notes (Signed)
Speech Language Pathology Daily Session Note  Patient Details  Name: Denise [name]. Denise [name] MRN: [id number] Date of Birth: [birth year]  Today's Date: 11/11/2018 SLP Individual Time: 1303-1400 SLP Individual Time Calculation (min): 57 min  Short Term Goals: Week 1: SLP Short Term Goal 1 (Week 1): Pt will demonstrate ability to follow basic commands during functional tasks with min A verbal cues. SLP Short Term Goal 2 (Week 1): Pt will demonstrate basic problem solving during ADL tasks with min A verbal cues. SLP Short Term Goal 3 (Week 1): Pt will demonstrate intellectual awareness listing 2 phsyical and 2 cognitive deficits with min A verbal cues. SLP Short Term Goal 4 (Week 1): Pt will demonstrate sustained attention in 15 minute intervals during functional task with min A verbal cues. SLP Short Term Goal 5 (Week 1): Pt will demonstrate recall of novel, daily events with mod A verbal cues for compensatory aid.  Skilled Therapeutic Interventions: Skilled ST services focused on education and cognition skills. SLP spent a large portion of session with pt and pt's husband, Chalmers Cater to establish baseline verse acute deficits. Pt expressed need to use the bathroom, SLP provided supervision A for RW, however pt required mod-min A verbal cues for problem solving/safety awareness. Pt demonstrated impulsivity while SLP changed O2 from wall to travel device and began removing pants 2-3 feet from toilet. SLP facilitated continued assessment of cognitive linguistic skills with [place] basic, pt scored 13 out 30 (n=>26) with deficits in basic problem solving, intellectual awareness, immediate/delayed recall, sustained attention and word finding in generative and on conformation naming task of pictures. [name] expressed cognitive and linguistic changes noted from acute infarct in the areas listed above following pt's demonstration of skills. Pt was able to name x2 cognitive and x2 physical changes with mod A verbal cues.  SLP provided education to pt and [name] pertaining to current deficits, goals targeted in ST sessions and ways to assist pt in intellectual awareness, problem solving, safety awareness and word finding outside of treatment sessions. All questions answered to satisfaction. Pt was left in room with husband, call bell within reach and chair alarm set. ST recommends to continue skilled ST services.      Pain Pain Assessment Pain Score: 0-No pain  Therapy/Group: Individual Therapy  Neta Upadhyay  [facility] 11/11/2018, 3:42 PM

## 2018-11-11 NOTE — Progress Notes (Signed)
Physical Therapy Session Note  Patient Details  Name: Denise [name] MRN: [id number] Date of Birth: 12-30-1945  Today's Date: 11/11/2018 PT Individual Time: 1520-1630 PT Individual Time Calculation (min): 70 min   Short Term Goals: Week 1:  PT Short Term Goal 1 (Week 1): Pt will perform transfers with CGA PT Short Term Goal 2 (Week 1): Pt will ambulate at least 134f using LRAD with CGA PT Short Term Goal 3 (Week 1): Pt will ascend/descend 4 steps using handrails with CGA  Skilled Therapeutic Interventions/Progress Updates:    Pt seated in recliner upon PT arrival, agreeable to therapy tx and denies pain. Maintained on 4L O2/min throughout the session vis nasal canula. Pt ambulated from recliner<>bathroom this session 2 x 10 ft without AD and with CGA, pt used bathroom and continent of bowel and bladder, supervision for pericare/clothing management. Pt ambulated x 180 ft without AD and min assist with x 3 standing rest breaks throughout secondary to increased work of breathing, cues for breathing techniques. Pt transported the rest of the way to the gym. Vitals checked in sitting- BP 143/85, HR 82 bpm, SpO2 returned to 95% following therapeutic rest break. Pt worked on dynamic standing balance throughout session without UE support this session to perform the following tasks: toe taps on aerobic step x 10 per LE, ambulation forwards without AD x 80 ft, sidestepping 2 x 10 ft in each direction, step ups on aerobic step x 5 per LE. Pt worked on LE strengthening and endurance to perform sit<>stands without UE support, 2 x 5 with cues for techniques. Throughout session pt's SpO2 went down to 84% with activity, pt able to recover and return SpO2 to 96% after therapeutic rest break. Pt ambulated back to room 3 x 60 ft bouts with standing rest breaks between, SpO2 down to 80%, transported back to room. Pt ambulated to the bathroom with min assist, continent of bladder. Pt left seated EOB in care of  husband with bed alarm set.   Therapy Documentation Precautions:  Precautions Precautions: Fall, Other (comment) Precaution Comments: monitor SpO2 sats (on 4L of O2 via nasal cannula baseline) and BP (MD approved for therapy with BP <180/110) Restrictions Weight Bearing Restrictions: No    Therapy/Group: Individual Therapy  ENetta Corrigan PT, DPT, CSRS 11/11/2018, 1:00 PM

## 2018-11-11 NOTE — Progress Notes (Signed)
Physical Therapy Session Note  Patient Details  Name: Denise [name] MRN: [id number] Date of Birth: 06/16/1945  Today's Date: 11/11/2018 PT Individual Time: 0950-1020 PT Individual Time Calculation (min): 30 min   Short Term Goals: Week 1:  PT Short Term Goal 1 (Week 1): Pt will perform transfers with CGA PT Short Term Goal 2 (Week 1): Pt will ambulate at least 145f using LRAD with CGA PT Short Term Goal 3 (Week 1): Pt will ascend/descend 4 steps using handrails with CGA  Skilled Therapeutic Interventions/Progress Updates:   Pt sitting on EOB, on 4L O2 via wall.  No c/o pain.  She stated that she needed to use toilet.  Sit> stand with supervision; gait in room to BR, no AD, CGA. .  Toilet transfer with close supervision.  Pt continent of bladder and managed clothes with extra time,  supervision. Hand washing at sink with cues.  neuromuscular re-education via demo, multimodal cues for : standing-10 x 10 bil heel raises, mini squats, bil toe raises.  Pt required seated rest between bouts.  Single limb stance with bil UE support, x 2 seconds R, x 1 second L.  At end of session, pt sitting EOB, alarm set.  Husband and BJacqlyn Larsen CSW present.     Therapy Documentation Precautions:  Precautions Precautions: Fall, Other (comment) Precaution Comments: monitor SpO2 sats (on 4L of O2 via nasal cannula baseline) and BP (MD approved for therapy with BP <180/110) Restrictions Weight Bearing Restrictions: No    Vital Signs: After exercise: BP 140/99 Oxygen Therapy SpO2: 100 % O2 Device: Nasal Cannula O2 Flow Rate (L/min): 4 L/min Pulse Oximetry Type: Continuous       Therapy/Group: Individual Therapy  Quency Tober 11/11/2018, 10:30 AM

## 2018-11-11 NOTE — Plan of Care (Signed)
  Problem: RH SKIN INTEGRITY Goal: RH STG SKIN FREE OF INFECTION/BREAKDOWN Description: Patient will remain free of infection/skin breakdown while in rehab. Outcome: Progressing Goal: RH STG MAINTAIN SKIN INTEGRITY WITH ASSISTANCE Description: STG Maintain Skin Integrity With Mod I Assistance. Outcome: Progressing Goal: RH STG ABLE TO PERFORM INCISION/WOUND CARE W/ASSISTANCE Description: STG Able To Perform Incision/Wound Care With [employer]. Outcome: Progressing   Problem: RH SAFETY Goal: RH STG ADHERE TO SAFETY PRECAUTIONS W/ASSISTANCE/DEVICE Description: STG Adhere to Safety Precautions With [place] with appropriate assistive Device. Outcome: Progressing Goal: RH STG DECREASED RISK OF FALL WITH ASSISTANCE Description: STG Decreased Risk of Fall With [employer]. Outcome: Progressing   Problem: RH COGNITION-NURSING Goal: RH STG USES MEMORY AIDS/STRATEGIES W/ASSIST TO PROBLEM SOLVE Description: STG Uses Memory Aids/Strategies With Min Assistance to Problem Solve. Outcome: Progressing Goal: RH STG ANTICIPATES NEEDS/CALLS FOR ASSIST W/ASSIST/CUES Description: STG Anticipates Needs/Calls for Assist With Min Assistance/Cues. Outcome: Progressing   Problem: RH PAIN MANAGEMENT Goal: RH STG PAIN MANAGED AT OR BELOW [name] PAIN GOAL Description: Patient will maintain pain goal of <3. Outcome: Progressing   Problem: RH KNOWLEDGE DEFICIT Goal: RH STG INCREASE KNOWLEDGE OF HYPERTENSION Description: Patient/Family will demonstrate knowledge of HTN medications and prevention strategies with Mod I assistance. Outcome: Progressing Goal: RH STG INCREASE KNOWLEDGE OF DYSPHAGIA/FLUID INTAKE Description: Patient/family will demonstrate appropriate knowledge of fluid intake and swallowing precautions with Mod I assistance.  Outcome: Progressing Goal: RH STG INCREASE KNOWLEGDE OF HYPERLIPIDEMIA Description: Patient/Family will demonstrate appropriate knowledge of hyperlipidemia  medications and prevention strategies with Mod I assistance. Outcome: Progressing Goal: RH STG INCREASE KNOWLEDGE OF STROKE PROPHYLAXIS Description: Patient/Family will demonstrate appropriate knowledge of preventative measures to decrease likelihood of stroke related to diet, exercise, medication management with Mod I assistance Outcome: Progressing   Problem: Consults Goal: RH STROKE PATIENT EDUCATION Description: See Patient Education module for education specifics  Outcome: Progressing

## 2018-11-11 NOTE — Progress Notes (Signed)
Izora Ribas, MD  Physician  Physical Medicine and Rehabilitation  PMR Pre-admission  Addendum  Date of Service:  11/07/2018 1:50 PM      Related encounter: ED to Hosp-Admission (Discharged) from 11/04/2018 in [facility]         Show:Clear all _0 Manual_1 Template_2 Copied  Added by: _3 Cristina Gong, RN_4 Ranell Patrick Clide Deutscher, MD  _5 Hover for details PMR Admission Coordinator Pre-Admission Assessment  Patient: Denise [name]. Pong is an 73 y.o., female MRN: [id number] DOB: Dec 27, 1945 Height: _6  (167.6 cm) Weight: 44.9 kg                                                                                                                                                  Insurance Information HMO: yes    PPO:      PCP:     IPA:      80/20:      OTHER: medicare advantage PRIMARY: [employer]      Policy#: [id number]      Subscriber: pt CM Name:  [name]     Phone#: [phone] ext [phone]     Fax#: [phone] Pre-Cert#: [id number]  Approved for 7 days with f/u with [facility] ext [phone] fax [phone]    Employer:  Benefits:  Phone #: [phone]     Name: 11/5 Eff. Date: 02/02/2018     Deduct: none      Out of Pocket Max: $3400      Life Max: none CIR: $295 co pay per day days 1 until 6      SNF: no copay days 1 until 20; $178 co pay per day days 21 until 100 Outpatient: $10 to $40 co py per visit     Co-Pay: visits per medical neccesity Home Health: 100%      Co-Pay: visits per medical neceesity DME: 805     Co-Pay: 205 Providers: in network  SECONDARY: none      Medicaid Application Date:       Case Manager:  Disability Application Date:       Case Worker:   The "Data Collection Information Summary" for patients in Inpatient Rehabilitation Facilities with attached "Privacy Act [place] Records" was provided and verbally reviewed with: Patient and Family  Emergency Contact Information         Contact Information    Name  Relation Home Work Mobile   [name] Sister [phone]  [phone]   [name],[name] Spouse [phone]       Current Medical History  Patient Admitting Diagnosis: right cerebellar CVA   History of Present Illness: 73 year old female with past history of diabetes, HTN, hyperlipidemia, colon cancer, COPD,pulmonary HTN, chronic respiratory failure with hypoxia, hyperparathyroidism and systolic CHF with DOE. Recent admission to hospital for respiratory failure.Presented on 11/04/2018 with sudden onset of left sided facial weakness and left  arm weakness. EMS activated.  Patient treated with TPA.   CT head revealed right cerebellar infarct s/p TPA with hemorrhagic transformation 8 cc with mass effect. Patient given fibrinogen, and TXA for TPA reversal. Infarct likely embolic secondary to unknown source. MRI showed right cerebellar HT with right MCA small scattered infarcts. MRA unremarkable. Carotid doppler B ICA 1-39% stenosis, VAs antegrade. 2 d echo EF 60 to 65 %. No source embolus. Consider amyloidosis. LE venous dopplers negative for DVT. Will plan LOOP recorder placement 11/6 to rule out Afib. SCDs for VTE prophylaxis. ASA 81 mg daily prior to admit. Now on no antithrombotic due to HT. Repeat CT 11/5 showed stable HT right cerebellar CVA, but new infarct at left cerebellar.To satrt ada 325 mg daily.  HTN urgency treated with cleviprex initially. Home meds of Isosorbide HCTZ, lasix, losartan, metoprolol and spironolactone. BP goals <140 given hemorrhagic transformation.  Avoid low BP given left VA high grade stenosis. Home meds resumed except for losartan.Lipitor increased to 20 with LDL 91. To continue statin at discharge. Hgb A1c 7 with no meds pta. CBGS and SSI with close PCP follow up as outpatient. Severe malnutrition due to chronic illness.   Old left frontal lobe infarct noted.  Complete NIHSS TOTAL: 2 Glasgow Coma Scale Score: 15  Past Medical History  Past Medical  History:  Diagnosis Date  . Altered mental status   . Anxiety and depression   . Chronic respiratory failure with hypoxia ([place]) 10/02/2017   Assoc with ? Cor pulmonale dx 09/2017 so rx 02 1-2 lpm 24/7  - 10/17/2017  Walked RA x 3 laps @ 185 ft each stopped due to  End of study, nl pace,  desat to 87 on 3rd lap - 12/19/2017  Saturations on Room Air at Rest =91 % and while Ambulating = 87%  But  on  2 Liters of pulsed oxygen while Ambulating =93% so rec POC 2lpm walking / use at rest if sats under 90%      . Colon cancer ([place]) 1988   Resected  . COPD (chronic obstructive pulmonary disease) ([place])   . Diabetes mellitus without complication ([name])    diet controlled- no meds. per pt  . Diarrhea 04/02/2018  . Diverticulosis   . Hyperlipidemia   . Hypertension   . Hypoxia 10/30/2018  . Insomnia   . Primary hyperparathyroidism ([place])   . SBO (small bowel obstruction) ([place])   . Stroke ([place]) 10/2018   tpa administered  . Tubular adenoma of rectum 02/23/2013   low grade  . Vitamin D deficiency     Family History  family history includes Ovarian cancer in her mother.  Prior Rehab/Hospitalizations:  Has the patient had prior rehab or hospitalizations prior to admission? Yes  Has the patient had major surgery during 100 days prior to admission? No  Current Medications   Current Facility-Administered Medications:  .   stroke: mapping our early stages of recovery book, , Does not apply, Once, Amie Portland, MD .  0.9 %  sodium chloride infusion, , Intravenous, Continuous, Rosalin Hawking, MD, Stopped at 11/08/18 [phone] .  acetaminophen (TYLENOL) tablet 650 mg, 650 mg, Oral, Q4H PRN, 650 mg at 11/07/18 2211 **OR** acetaminophen (TYLENOL) 160 MG/5ML solution 650 mg, 650 mg, Per Tube, Q4H PRN **OR** acetaminophen (TYLENOL) suppository 650 mg, 650 mg, Rectal, Q4H PRN, Amie Portland, MD .  albuterol (VENTOLIN HFA) 108 (90 Base) MCG/ACT inhaler 1-2 puff, 1-2 puff, Inhalation, Q4H PRN,  Amie Portland, MD .  aspirin EC tablet 325 mg, 325 mg, Oral, Daily, Rosalin Hawking, MD, 325 mg at 11/08/18 0174 .  atorvastatin (LIPITOR) tablet 20 mg, 20 mg, Oral, QHS, Rosalin Hawking, MD, 20 mg at 11/07/18 2211 .  Chlorhexidine Gluconate Cloth 2 % PADS 6 each, 6 each, Topical, Daily, Rosalin Hawking, MD, 6 each at 11/08/18 [phone] .  labetalol (NORMODYNE) injection 20 mg, 20 mg, Intravenous, Once **AND** clevidipine (CLEVIPREX) infusion 0.5 mg/mL, 0-21 mg/hr, Intravenous, Continuous, Amie Portland, MD, Stopped at 11/08/18 0430 .  famotidine (PEPCID) tablet 20 mg, 20 mg, Oral, Daily, Rosalin Hawking, MD, 20 mg at 11/08/18 0834 .  fluticasone furoate-vilanterol (BREO ELLIPTA) 100-25 MCG/INH 1 puff, 1 puff, Inhalation, Daily, 1 puff at 11/08/18 0732 **AND** umeclidinium bromide (INCRUSE ELLIPTA) 62.5 MCG/INH 1 puff, 1 puff, Inhalation, Daily, Amie Portland, MD, 1 puff at 11/08/18 0732 .  [START ON 11/09/2018] furosemide (LASIX) tablet 40 mg, 40 mg, Oral, Daily, Rosalin Hawking, MD .  hydrALAZINE (APRESOLINE) injection 5-10 mg, 5-10 mg, Intravenous, Q4H PRN, Rosalin Hawking, MD .  insulin aspart (novoLOG) injection 0-15 Units, 0-15 Units, Subcutaneous, TID WC, Rosalin Hawking, MD, 3 Units at 11/08/18 1429 .  insulin aspart (novoLOG) injection 0-5 Units, 0-5 Units, Subcutaneous, QHS, Rosalin Hawking, MD .  MEDLINE mouth rinse, 15 mL, Mouth Rinse, BID, Amie Portland, MD, 15 mL at 11/08/18 0834 .  mirtazapine (REMERON) tablet 15 mg, 15 mg, Oral, QHS, Rosalin Hawking, MD, 15 mg at 11/07/18 2211 .  senna-docusate (Senokot-S) tablet 1 tablet, 1 tablet, Oral, QHS PRN, Amie Portland, MD .  sodium chloride flush (NS) 0.9 % injection 3 mL, 3 mL, Intravenous, Once, Pfeiffer, Marcy, MD .  spironolactone (ALDACTONE) tablet 25 mg, 25 mg, Oral, QPM, Rosalin Hawking, MD, 25 mg at 11/07/18 2215  Patients Current Diet:     Diet Order                  Diet heart healthy/carb modified Room service appropriate? Yes; Fluid consistency: Thin  Diet  effective now               Precautions / Restrictions Precautions Precautions: Fall, Other (comment) Precaution Comments: watch O2 sats Restrictions Weight Bearing Restrictions: No   Has the patient had 2 or more falls or a fall with injury in the past year?No  Prior Activity Level Limited Community (1-2x/wk): Mod I without AD; does not drive  Prior Functional Level Prior Function Level of Independence: Independent Comments: reports not using walker  Self Care: Did the patient need help bathing, dressing, using the toilet or eating?  Independent  Indoor Mobility: Did the patient need assistance with walking from room to room (with or without device)? Independent  Stairs: Did the patient need assistance with internal or external stairs (with or without device)? Needed some help  Functional Cognition: Did the patient need help planning regular tasks such as shopping or remembering to take medications? Needed some help  Home Assistive Devices / [place] Devices/Equipment: Oxygen Home Equipment: Walker - 2 wheels  Prior Device Use: Indicate devices/aids used by the patient prior to current illness, exacerbation or injury? no AD  Current Functional Level Cognition  Arousal/Alertness: Awake/alert Overall Cognitive Status: Impaired/Different from baseline Current Attention Level: Sustained Orientation Level: Oriented to person, Oriented to place, Disoriented to time, Disoriented to situation Safety/Judgement: Decreased awareness of safety, Decreased awareness of deficits General Comments: Pt with difficulty communicating needs. Perseverating on finding her "pink in the closet," while pointing to the computer. PT deduced  she was looking for her denture case but was unable to find it; pt not comprehending that case was not in room.  Attention: Selective Selective Attention: Appears intact Memory: Impaired Memory Impairment: Other  (comment)(benefits from repetition of information) Awareness: Impaired Awareness Impairment: (intermittent recall of immediate plans)    Extremity Assessment (includes Sensation/Coordination)  Upper Extremity Assessment: LUE deficits/detail, RUE deficits/detail RUE Deficits / Details: generalized weakness LUE Deficits / Details: 2+/5 breaks with very little resistance. Decreased FMC overall LUE Sensation: WNL LUE Coordination: decreased fine motor  Lower Extremity Assessment: Defer to PT evaluation    ADLs  Overall ADL's : Needs assistance/impaired Eating/Feeding: Set up, Sitting Eating/Feeding Details (indicate cue type and reason): set up to open packaging and arrange tray for successful feeding. Pt able to manage soft foods with R hand utensils Grooming: Set up, Sitting Upper Body Bathing: Sitting, Moderate assistance Lower Body Bathing: Sit to/from stand, Sitting/lateral leans, Maximal assistance Upper Body Dressing : Moderate assistance, Sitting Lower Body Dressing: Maximal assistance, Sit to/from stand, Sitting/lateral leans Toilet Transfer: Minimal assistance, Moderate assistance, BSC, Stand-pivot Toilet Transfer Details (indicate cue type and reason): min A to rise, mod A for steadying with stand pivots- simulated with recliner Toileting- Clothing Manipulation and Hygiene: Moderate assistance, Sit to/from stand, Sitting/lateral lean Functional mobility during ADLs: Minimal assistance, Moderate assistance General ADL Comments: pt limited by cognitive deficits, L sided weakness, and decreased activity tolerance    Mobility  Overal bed mobility: Needs Assistance Bed Mobility: Supine to Sit Supine to sit: Min guard General bed mobility comments: OOB in chair    Transfers  Overall transfer level: Needs assistance Equipment used: Rolling walker (2 wheeled) Transfers: Sit to/from Stand Sit to Stand: Min assist Stand pivot transfers: Min assist, Mod assist General  transfer comment: MinA to rise and stabilize from recliner    Ambulation / Gait / Stairs / Wheelchair Mobility  Ambulation/Gait Ambulation/Gait assistance: Mod assist Gait Distance (Feet): 75 Feet(75',75') Assistive device: None Gait Pattern/deviations: Step-through pattern, Shuffle, Trunk flexed, Narrow base of support General Gait Details: ModA for stability without assistive device; pt reaching consistently for external support in hallway. Needs slower pace and increased assist for balance activities i.e. head turns, stepping over obstacles, stops/starts Gait velocity: decr Gait velocity interpretation: <1.8 ft/sec, indicate of risk for recurrent falls    Posture / Balance Dynamic Sitting Balance Sitting balance - Comments: seated on toilet with no UE support Balance Overall balance assessment: Needs assistance Sitting-balance support: Feet supported Sitting balance-Leahy Scale: Fair Sitting balance - Comments: seated on toilet with no UE support Standing balance support: Bilateral upper extremity supported, Single extremity supported, During functional activity Standing balance-Leahy Scale: Poor Standing balance comment: reliant on external support    Special needs/care consideration BiPAP/CPAP n/a CPM n/a Continuous Drip IV n/a Dialysis n/a Life Vest n/a Oxygen) 2 4 liters at baseline Special Bed n/a Trach Size n/a Wound Vac n/a Skin intact Bowel mgmt: continent lbm 11/5 Bladder mgmt: incontinent urgency Diabetic mgmt hgb A1c 7 Behavioral consideration n/a Chemo/radiation n/a Designated visitor is spouse, [name] LOOP placement 11/6   Previous Home Environment  Living Arrangements: Spouse/significant other  Lives With: Spouse Available Help at Discharge: Family, Available 24 hours/day Type of Home: House Home Layout: One level, Laundry or work area in basement Home Access: Stairs to enter Entrance Stairs-Rails: Building surveyor of Steps: 4  Bathroom Shower/Tub: Public librarian, Architectural technologist: Standard Bathroom Accessibility: Yes How Accessible: Accessible via walker Home Care Services: No Additional Comments:  home O2; home info taken with PT collateral who spoke with husband. Pt able to recall most information  Discharge Living Setting Plans for Discharge Living Setting: Patient's home, Lives with (comment)(spouse) Type of Home at Discharge: House Discharge Home Layout: One level Discharge Home Access: Stairs to enter Entrance Stairs-Rails: Right Entrance Stairs-Number of Steps: 4 Discharge Bathroom Shower/Tub: Tub/shower unit, Curtain Discharge Bathroom Toilet: Standard Discharge Bathroom Accessibility: Yes How Accessible: Accessible via walker Does the patient have any problems obtaining your medications?: No  Social/Family/Support Systems Patient Roles: Spouse Contact Information: spouse, Chalmers Cater Anticipated Caregiver: spouse Anticipated Caregiver's Contact Information: [phone] Ability/Limitations of Caregiver: no limitations Caregiver Availability: 24/7 Discharge Plan Discussed with Primary Caregiver: Yes Is Caregiver In Agreement with Plan?: Yes Does Caregiver/Family have Issues with Lodging/Transportation while Pt is in Rehab?: No  Goals/Additional Needs Patient/Family Goal for Rehab: Mod I to supervision wiht PT, OT, and SLP Expected length of stay: ELOS 7 to 10 days Pt/Family Agrees to Admission and willing to participate: Yes Program Orientation Provided & Reviewed with Pt/Caregiver Including Roles  & Responsibilities: Yes  Decrease burden of Care through IP rehab admission: n/a  Possible need for SNF placement upon discharge: not anticapted   Patient Condition: This patient's condition remains as documented in the consult dated 11/5, in which the Rehabilitation Physician determined and documented that the patient's condition is appropriate for intensive rehabilitative care in an  inpatient rehabilitation facility. Will admit to inpatient rehab today.  Assessment/Plan: Diagnosis: Acute ischemic stroke 1. Does the need for close, 24 hr/day Medical supervision in concert with the patient's rehab needs make it unreasonable for this patient to be served in a less intensive setting?Yes 2. Co-Morbidities requiring supervision/potential complications:diabetes, HTN, HLD, colon cancer, COPD, chronic respiratory failure with hypoxia, systolic CHF with DOE 3. Due tobladder management, bowel management, safety, skin/wound care, disease management, medication administration, pain management and patient education, does the patient require 24 hr/day rehab nursing?Yes 4. Does the patient require coordinated care of a physician, rehab nurse, PT, OT, and SLP to address physical and functional deficits in the context of the above medical diagnosis(es)?Yes Addressing deficits in the following areas:balance, endurance, locomotion, strength, transferring, bowel/bladder control, bathing, dressing, feeding, grooming, toileting, cognition, speech, language, swallowing and psychosocial support 5. Can the patient actively participate in an intensive therapy program of at least 3 hrs of therapy 5 days a week?Yes 6. The potential for patient to make measurable gains while on inpatient rehab isgood. 7. Anticipated functional outcomes upon discharge from inpatient rehab:MinAPT,modified independentOT,independentSLP 8. Estimated rehab length of stay to reach the above functional goals is:2 weeks 9. Anticipated discharge destination:Home 10.Overall Rehab/Functional Prognosis:good  Preadmission Screen Completed By:  Cleatrice Burke, RN, 11/08/2018 4:28 PM ______________________________________________________________________   Discussed status with Dr. Ranell Patrick on 11/08/2018 at  12 and received approval for admission today.  Admission Coordinator:  Cleatrice Burke, time  5366 Date 11/08/2018       Revision History

## 2018-11-11 NOTE — Progress Notes (Signed)
Initial Nutrition Assessment  DOCUMENTATION CODES:   Severe malnutrition in context of chronic illness, Underweight  INTERVENTION:  Provide Ensure Enlive po TID, each supplement provides 350 kcal and 20 grams of protein.  Encourage adequate PO intake.   NUTRITION DIAGNOSIS:   Severe Malnutrition related to chronic illness(COPD, CHF) as evidenced by severe fat depletion, severe muscle depletion.  GOAL:   Patient will meet greater than or equal to 90% of their needs  MONITOR:   PO intake, Supplement acceptance, Skin, Weight trends, Labs, I & O's  REASON FOR ASSESSMENT:   Other (Comment)(Low BMI, malnutrition)    ASSESSMENT:   73 year old female with history fo T2Dm, HTN, COPD with chronic hypoxic respiratory failure, colon cancer who was admitted on 11/04/18 with sudden onset of left sided weakness and slurred speech. CT head showed right cerebellar infarct with hemorrhagic transformation. MRI/MRA brain done revealing large right cerebellar hemorrhagic infarct with mass effect on posterior fossa and multiple additional small acute/subacute cortical infarcts in R-MCA territory with multiple small chronic hemorrhagic/non hemorrhargic cortical infarcts in bilateral cerebral hemispheres.  Meal completion has been 25-75% with 25% at breakfast this morning. Pt reports eating well PTA with usual consumption of at least 2 full meals a day with Boost shakes 1-2 times daily. Per weight records pt with a 5.7% weight loss in 1 month, however pt reports weight loss related to fluid status. RD to order nutritional supplements to aid in caloric and protein needs. Pt encouraged to eat her food at meals and to drink her supplements.   Labs and medications reviewed.   NUTRITION - FOCUSED PHYSICAL EXAM:    Most Recent Value  Orbital Region  Severe depletion  Upper Arm Region  Severe depletion  Thoracic and Lumbar Region  Severe depletion  Buccal Region  Severe depletion  Temple Region  Severe  depletion  Clavicle Bone Region  Severe depletion  Clavicle and Acromion Bone Region  Severe depletion  Scapular Bone Region  Unable to assess  Dorsal Hand  Severe depletion  Patellar Region  Severe depletion  Anterior Thigh Region  Severe depletion  Posterior Calf Region  Severe depletion  Edema (RD Assessment)  None  Hair  Reviewed  Eyes  Reviewed  Mouth  Reviewed  Skin  Reviewed  Nails  Reviewed       Diet Order:   Diet Order            Diet heart healthy/carb modified Room service appropriate? Yes; Fluid consistency: Thin  Diet effective now              EDUCATION NEEDS:   Education needs have been addressed  Skin:  Skin Assessment: Reviewed RN Assessment  Last BM:  11/8  Height:   Ht Readings from Last 1 Encounters:  11/09/18 _0  (1.676 m)    Weight:   Wt Readings from Last 1 Encounters:  11/11/18 46.2 kg    Ideal Body Weight:  59 kg  BMI:  Body mass index is 16.44 kg/m.  Estimated Nutritional Needs:   Kcal:  1550-1750  Protein:  70-80 grams  Fluid:  >/= 1.5 L/day    Corrin Parker, MS, RD, LDN Pager # [phone] After hours/ weekend pager # [phone]

## 2018-11-11 NOTE — Progress Notes (Signed)
[place] PHYSICAL MEDICINE & REHABILITATION PROGRESS NOTE   Subjective/Complaints:  Denise [name] says she is feeling well this morning. She walked with therapy this weekend and was able to go up and down the stairs. Denies pain and constipation.  Labs stable this morning.   ROS- neg CP, SOB, N/V/D Objective:   No results found. Recent Labs    11/09/18 0443 11/11/18 0639  WBC 5.1 5.1  HGB 11.4* 11.5*  HCT 35.9* 36.6  PLT 193 158   Recent Labs    11/09/18 0443 11/11/18 0639  NA 143 143  K 3.9 4.6  CL 103 103  CO2 31 31  GLUCOSE 143* 117*  BUN 23 15  CREATININE 1.19* 1.19*  CALCIUM 10.4* 10.6*    Intake/Output Summary (Last 24 hours) at 11/11/2018 0854 Last data filed at 11/11/2018 0741 Gross per 24 hour  Intake 542 ml  Output -  Net 542 ml     Physical Exam: Vital Signs Blood pressure 136/83, pulse 70, temperature 98.3 F (36.8 C), resp. rate 16, height _0  (1.676 m), weight 46.2 kg, SpO2 99 %.   General: No acute distress, sitting up in bed.  Mood and affect are appropriate Heart: Regular rate and rhythm no rubs murmurs or extra sounds Lungs: Clear to auscultation, breathing unlabored, no rales or wheezes Abdomen: Positive bowel sounds, soft nontender to palpation, nondistended Extremities: No clubbing, cyanosis, or edema Skin: No evidence of breakdown, no evidence of rash Neurologic: Cranial nerves II through XII intact, motor strength is 5/5 in bilateral deltoid, bicep, tricep, grip, hip flexor, knee extensors, ankle dorsiflexor and plantar flexor Sensory exam normal sensation to light touch and proprioception in bilateral upper and lower extremities Cerebellar exam Mild dysmetria Left FNF, could not cooperate with Heel SHin testing  Musculoskeletal: Full range of motion in all 4 extremities. No joint swelling.    Assessment/Plan: 1. Functional deficits secondary to Right cerebellar ICH and Left cerebellar infarct  which require 3+ hours per day of  interdisciplinary therapy in a comprehensive inpatient rehab setting.  Physiatrist is providing close team supervision and 24 hour management of active medical problems listed below.  Physiatrist and rehab team continue to assess barriers to discharge/monitor patient progress toward functional and medical goals  Care Tool:  Bathing  Bathing activity did not occur: Safety/medical concerns(BP elevated and O2 low)           Bathing assist       Upper Body Dressing/Undressing Upper body dressing   What is the patient wearing?: Pull over shirt    Upper body assist Assist Level: Supervision/Verbal cueing    Lower Body Dressing/Undressing Lower body dressing      What is the patient wearing?: Pants     Lower body assist Assist for lower body dressing: Minimal Assistance - Patient > 75%     Toileting Toileting    Toileting assist Assist for toileting: Minimal Assistance - Patient > 75%     Transfers Chair/bed transfer  Transfers assist     Chair/bed transfer assist level: Independent     Locomotion Ambulation   Ambulation assist      Assist level: Minimal Assistance - Patient > 75% Assistive device: No Device Max distance: 123f   Walk 10 feet activity   Assist     Assist level: Minimal Assistance - Patient > 75% Assistive device: No Device   Walk 50 feet activity   Assist    Assist level: Minimal Assistance - Patient > 75%  Assistive device: No Device    Walk 150 feet activity   Assist Walk 150 feet activity did not occur: Safety/medical concerns         Walk 10 feet on uneven surface  activity   Assist     Assist level: Minimal Assistance - Patient > 75%(up/down ramp) Assistive device: Other (comment)(None)   Wheelchair     Assist Will patient use wheelchair at discharge?: No(TBD but do not anticipate)             Wheelchair 50 feet with 2 turns activity    Assist            Wheelchair 150 feet  activity     Assist          Blood pressure 136/83, pulse 70, temperature 98.3 F (36.8 C), resp. rate 16, height _0  (1.676 m), weight 46.2 kg, SpO2 99 %.  Medical Problem List and Plan: 1.Impaired fait and mobilitysecondary to acute ischemic stroke with hemorrhagic transformation. CIR PT, OT, amb ~100' minA  2. Antithrombotics: -DVT/anticoagulation:Mechanical:Sequential compression devices, below kneeBilateral lower extremitiesBLE dopplers negative on 11/4 -antiplatelet therapy: ASA 325 mg daily 3.Headaches/Pain Management:Tylenol prn 4. Mood:LCSW to follow for evaluation and support.She has a history of anxiety and depression. -antipsychotic agents: N/A 5. Neuropsych: This patientiscapable of making decisions on herown behalf. 6. Skin/Wound Care:Keep incision C/D for 3 days. Monitor wound for healing. 7. Fluids/Electrolytes/Nutrition:Maintain adequate nutritional and hydration status.She has severe protein calorie nutrition and is currently on Mirtazepine 54m HS to stimulate appetite. 8. HTN: Monitor BP tid and avoid hypotension due to high grade L-VA stenosis.BP goals <160 due to cerebellar bleed. Currently on Lasix, aldactone and Imdur--metoprolol and Losartan on hold at this time. 9. T2DM: Hgb A1c- 7.0. Monitor BS ac/hs with SSIfor now.Was on glucotrol 2.5 mg daily prior to last admission. CBG (last 3)  Recent Labs    11/10/18 1638 11/10/18 2100 11/11/18 0627  GLUCAP 263* 70 97  fair control monitor 10. COPD:4 litersOxygen dependent.Respiratory status stable on Incruse and Breo daily.  11. Chronic systolic CHF: Monitor for signs of overload and check weight daily. Continue spironolactone, Imdur, Lipitor, ASA and lasix daily.  12. Hypokalemia: Will likely need daily supplement as still low.Will add kdur bid--check labs in am. 13. ABLA: CBC stable this morning 11/9.  14. CKD?: BUN/SCr - 31/1.36 at admission  likely due to recent diuresis. Continue to monitor with serial checks. 15. Dysuria with incontinence: UA 50WBC , +yeast , neg bacteria, start diflucan   LOS: 3 days A FACE TO FACE EVALUATION WAS PERFORMED  KClide DeutscherPaulkar 11/11/2018, 8:54 AM

## 2018-11-11 NOTE — Progress Notes (Signed)
Occupational Therapy Session Note  Patient Details  Name: Denise [name] MRN: [id number] Date of Birth: 1945-10-01  Today's Date: 11/11/2018 OT Individual Time: 1115-1200 OT Individual Time Calculation (min): 45 min    Short Term Goals: Week 1:  OT Short Term Goal 1 (Week 1): Pt will dress EOB with vitals remaining WNL to demo improved endurance on 4L O2 OT Short Term Goal 2 (Week 1): Pt will transfer to toilet wiht CGA OT Short Term Goal 3 (Week 1): Pt will stand to compelte oral care wiht CGA to demo improved endurance  Skilled Therapeutic Interventions/Progress Updates:    Pt received sitting in recliner with husband present, no c/o pain. Pt on 4L O2 via [place] with SpO2 at 94%, BP 137/99 at rest. Pt demonstrated slow processing and poor memory throughout session with husband often compensating for her. Pt completed functional mobility to the sink with CGA. She was able to complete UB bathing and dressing with min A. Mod A for LB dressing from toilet 2/2 urinary incontinence. Pt transferred to and from toilet with CGA, using grab bar. Pt able to maintain single leg stance with unilateral UE support with CGA. Pt demonstrated SOB following mobility and transfers, with SpO2 dropping to as low as 84% with pt otherwise asymptomatic. Pt unaware of importance of Spo2 and stating that 85% was "good". BP during session was 153/99.  [name] spouse confirming that pt's baseline is 94-95% on 4L O2. Pt ended session with 100 ft of functional mobility without any AE with CGA. Pt was left sitting up in the recliner with all needs met, chair alarm set.   Therapy Documentation Precautions:  Precautions Precautions: Fall, Other (comment) Precaution Comments: monitor SpO2 sats (on 4L of O2 via nasal cannula baseline) and BP (MD approved for therapy with BP <180/110) Restrictions Weight Bearing Restrictions: No   Therapy/Group: Individual Therapy  Curtis Sites 11/11/2018, 7:25 AM

## 2018-11-11 NOTE — IPOC Note (Signed)
Overall Plan of Care [facility]) Patient Details Name: Denise [name]. Denise [name] MRN: [id number] DOB: 1945-10-28  Admitting Diagnosis: Cerebellar stroke, acute [facility])  Hospital Problems: Principal Problem:   Cerebellar stroke, acute ([place])     Functional Problem List: Nursing Bladder, Bowel, Endurance, Medication Management, Motor, Pain, Safety, Skin Integrity  PT Balance, Safety, Endurance, Motor, Pain, Nutrition, Perception, Behavior  OT Balance, Cognition, Endurance, Motor, Safety  SLP Cognition  TR         Basic ADL's: OT Grooming, Bathing, Dressing, Toileting     Advanced  ADL's: OT       Transfers: PT Bed Mobility, Bed to Chair, Car, Manufacturing systems engineer, Metallurgist: PT Ambulation, Stairs     Additional Impairments: OT Fuctional Use of Upper Extremity  SLP Social Cognition, Communication comprehension Problem Solving, Memory, Attention, Awareness  TR      Anticipated Outcomes Item Anticipated Outcome  Self Feeding S  Swallowing      Basic self-care  S  Toileting  S   Bathroom Transfers S  Bowel/Bladder  min assist timed toileting for occassional urinary incontinence  Transfers  supervision  Locomotion  supervision  Communication     Cognition  Min-Supervision A  Pain  pt denies pain tylenol ordered if needed  Safety/Judgment  pt is impatient and may need reminders regarding safety    Therapy Plan: PT Intensity: Minimum of 1-2 x/day ,45 to 90 minutes PT Frequency: 5 out of 7 days PT Duration Estimated Length of Stay: 10-14 days OT Intensity: Minimum of 1-2 x/day, 45 to 90 minutes OT Frequency: 5 out of 7 days OT Duration/Estimated Length of Stay: 10-14 SLP Intensity: Minumum of 1-2 x/day, 30 to 90 minutes SLP Frequency: 3 to 5 out of 7 days SLP Duration/Estimated Length of Stay: 10-14 days   Due to the current state of emergency, patients may not be receiving their 3-hours of Medicare-mandated therapy.   Team  Interventions: Nursing Interventions Patient/Family Education, Bladder Management, Disease Management/Prevention, Medication Management, Skin Care/Wound Management, Discharge Planning, Psychosocial Support, Cognitive Remediation/Compensation  PT interventions Ambulation/gait training, Community reintegration, DME/adaptive equipment instruction, Neuromuscular re-education, Psychosocial support, Stair training, UE/LE Strength taining/ROM, Training and development officer, Discharge planning, Functional electrical stimulation, Pain management, Skin care/wound management, Therapeutic Activities, UE/LE Coordination activities, Cognitive remediation/compensation, Disease management/prevention, Functional mobility training, Patient/family education, Splinting/orthotics, Therapeutic Exercise, Visual/perceptual remediation/compensation  OT Interventions Balance/vestibular training, Discharge planning, Pain management, Self Care/advanced ADL retraining, Therapeutic Activities, UE/LE Coordination activities, Cognitive remediation/compensation, Disease mangement/prevention, Functional mobility training, Patient/family education, Skin care/wound managment, Therapeutic Exercise, Visual/perceptual remediation/compensation, Community reintegration, Engineer, drilling, Neuromuscular re-education, Psychosocial support, Splinting/orthotics, UE/LE Strength taining/ROM, Wheelchair propulsion/positioning  SLP Interventions Cognitive remediation/compensation, English as a second language teacher, Functional tasks, Internal/external aids, Patient/family education  TR Interventions    SW/CM Interventions Discharge Planning, Psychosocial Support, Patient/Family Education   Barriers to Discharge MD  Medical stability  Nursing Incontinence    PT Inaccessible home environment, Medical stability, Home environment access/layout    OT Medical stability    SLP      SW       Team Discharge Planning: Destination: PT-Home ,OT- Home ,  SLP-Home Projected Follow-up: PT-Home health PT, 24 hour supervision/assistance, OT-  Home health OT, SLP-Home Health SLP, 24 hour supervision/assistance Projected Equipment Needs: PT-To be determined, OT- To be determined, Tub/shower bench, SLP-None recommended by SLP Equipment Details: PT- , OT-  Patient/family involved in discharge planning: PT- Patient, Family member/caregiver,  OT-Patient, Family member/caregiver, SLP-Patient, Family member/caregiver  MD ELOS: 10-14 days Medical  Rehab Prognosis:  Excellent Assessment: The patient has been admitted for CIR therapies with the diagnosis of cerebellar infarct. The team will be addressing functional mobility, strength, stamina, balance, safety, adaptive techniques and equipment, self-care, bowel and bladder mgt, patient and caregiver education, NMR, cognition, communication. Goals have been set at supervision with mobility and self-care and supervision to min assist with cognition, communication.   Due to the current state of emergency, patients may not be receiving their 3 hours per day of Medicare-mandated therapy.    Meredith Staggers, MD, FAAPMR      See Team Conference Notes for weekly updates to the plan of care

## 2018-11-11 NOTE — Progress Notes (Signed)
Inpatient Rehabilitation  Patient information reviewed and entered into eRehab system by Asuncion Shibata M. Calyn Sivils, M.A., CCC/SLP, PPS Coordinator.  Information including medical coding, functional ability and quality indicators will be reviewed and updated through discharge.    

## 2018-11-12 ENCOUNTER — Inpatient Hospital Stay (HOSPITAL_COMMUNITY): Payer: Medicare HMO | Admitting: Physical Therapy

## 2018-11-12 ENCOUNTER — Inpatient Hospital Stay (HOSPITAL_COMMUNITY): Payer: Medicare HMO | Admitting: Occupational Therapy

## 2018-11-12 ENCOUNTER — Inpatient Hospital Stay (HOSPITAL_COMMUNITY): Payer: Medicare HMO | Admitting: Speech Pathology

## 2018-11-12 LAB — GLUCOSE, CAPILLARY
Glucose-Capillary: 71 mg/dL (ref 70–99)
Glucose-Capillary: 132 mg/dL — ABNORMAL HIGH (ref 70–99)
Glucose-Capillary: 186 mg/dL — ABNORMAL HIGH (ref 70–99)
Glucose-Capillary: 158 mg/dL — ABNORMAL HIGH (ref 70–99)
Glucose-Capillary: 118 mg/dL — ABNORMAL HIGH (ref 70–99)
Glucose-Capillary: 122 mg/dL — ABNORMAL HIGH (ref 70–99)
Glucose-Capillary: 185 mg/dL — ABNORMAL HIGH (ref 70–99)
Glucose-Capillary: 142 mg/dL — ABNORMAL HIGH (ref 70–99)

## 2018-11-12 NOTE — Progress Notes (Signed)
Speech Language Pathology Daily Session Note  Patient Details  Name: Denise [name]. [place] MRN: [id number] Date of Birth: 1945/07/20  Today's Date: 11/12/2018 SLP Individual Time: 1330-1415 SLP Individual Time Calculation (min): 45 min  Short Term Goals: Week 1: SLP Short Term Goal 1 (Week 1): Pt will demonstrate ability to follow basic commands during functional tasks with min A verbal cues. SLP Short Term Goal 2 (Week 1): Pt will demonstrate basic problem solving during ADL tasks with min A verbal cues. SLP Short Term Goal 3 (Week 1): Pt will demonstrate intellectual awareness listing 2 phsyical and 2 cognitive deficits with min A verbal cues. SLP Short Term Goal 4 (Week 1): Pt will demonstrate sustained attention in 15 minute intervals during functional task with min A verbal cues. SLP Short Term Goal 5 (Week 1): Pt will demonstrate recall of novel, daily events with mod A verbal cues for compensatory aid.  Skilled Therapeutic Interventions: Skilled treatment session focused on cognitive goals. SLP facilitated session by providing Mod A verbal cues for problem solving during a 4-step picture sequencing task and Min A verbal cues for word-finding while describing the actions within the pictures at the phrase level. Patient left upright in bed with all needs within reach and alarm on. Continue with current plan of care.      Pain Pain Assessment Pain Scale: Faces Pain Score: 0-No pain  Therapy/Group: Individual Therapy  Acelyn Basham 11/12/2018, 3:03 PM

## 2018-11-12 NOTE — Progress Notes (Signed)
Physical Therapy Session Note  Patient Details  Name: Denise [name] MRN: [id number] Date of Birth: [birth year]  Today's Date: 11/12/2018 PT Individual Time: 0800-0900 PT Individual Time Calculation (min): 60 min   Short Term Goals: Week 1:  PT Short Term Goal 1 (Week 1): Pt will perform transfers with CGA PT Short Term Goal 2 (Week 1): Pt will ambulate at least 125f using LRAD with CGA PT Short Term Goal 3 (Week 1): Pt will ascend/descend 4 steps using handrails with CGA  Skilled Therapeutic Interventions/Progress Updates:    Pt received supine in bed, agreeable to PT session. No complaints of pain. Supine BP 137/84, SpO2 96% while on 4L O2. Bed mobility independent. Sit to stand with Supervision throughout session. Ambulation 2 x 150 ft with no AD and Supervision with intermittent use of rail in hallway for balance. Seated vitals following gait: SpO2 drops to 84%, increased O2 to 5L with v/c for pursed lip breathing, seated BP 154/87. While on 5L O2 with seated rest break SpO2 increases to 92%. Nustep level 2 x 3 min with use of B UE/LE for global endurance training, one seated rest break due to fatigue. SpO2 94% while on Nustep, BP 160/101, deferred further exercise on Nustep due to increase in BP. Pt reports urge to use the bathroom. Toilet transfer with CGA, Supervision for clothing management and pericare. Pt requests to return to bed at end of session. Pt left supine in bed with needs in reach at end of session.  Therapy Documentation Precautions:  Precautions Precautions: Fall, Other (comment) Precaution Comments: monitor SpO2 sats (on 4L of O2 via nasal cannula baseline) and BP (MD approved for therapy with BP <180/110) Restrictions Weight Bearing Restrictions: No    Therapy/Group: Individual Therapy   TExcell Seltzer PT, DPT  11/12/2018, 12:39 PM

## 2018-11-12 NOTE — Progress Notes (Signed)
Patient dropping to low 80's with fatigue especially as day progresses despite increase to 6 L. Discussed with PT that patient with COPD as well as cor pulmonale (suspected HCM). Per pulmonary notes she was having increase in SOB over past few months and had been treated with steroid taper three weeks ago. Per husband patient would get SOB ambulating household distances and was sedentary during the day. Relayed to husband that patient is getting her MDI  (split up into 2 inhalers). Will adjust to 15 hrs/7 days.

## 2018-11-12 NOTE — Progress Notes (Signed)
Occupational Therapy Session Note  Patient Details  Name: Denise [name] MRN: [id number] Date of Birth: January 27, 1945  Today's Date: 11/12/2018 OT Individual Time: 1020-1103 OT Individual Time Calculation (min): 43 min    Short Term Goals: Week 1:  OT Short Term Goal 1 (Week 1): Pt will dress EOB with vitals remaining WNL to demo improved endurance on 4L O2 OT Short Term Goal 2 (Week 1): Pt will transfer to toilet wiht CGA OT Short Term Goal 3 (Week 1): Pt will stand to compelte oral care wiht CGA to demo improved endurance  Skilled Therapeutic Interventions/Progress Updates:    Pt in bed to start session with her spouse present.  She agreed to participate, and requested completion of toilet transfer prior to leaving the room.  She completed toilet transfer and toileting with min guard assist hand held assist.  Pt completed washing her hands at the sink and then rested before ambulating down to the tub room.  Min assist was needed for functional mobility to the tub room, with practice stepping over the edge of the tub with min guard assist using the grab bars for support.  Both pt and spouse report already having a shower seat and grab bars.  Oxygen sats at 90-91% on 4Ls when resting after activity. She was 96% after recovering for 1-2 mins.  Had pt complete standing as well as LUE coordination with use of the [employer].  She was able to complete three, 1 minute intervals with average number of seconds at 3.33, 3.1, and 3.33 using the left index finger.  Rest breaks given between each set with min guard for balance while standing to complete them.  Finished session with ambulation back to the room with min assist and no device.  She was left in the bed with call button and phone in reach and safety alarm in place.      Therapy Documentation Precautions:  Precautions Precautions: Fall, Other (comment) Precaution Comments: monitor SpO2 sats (on 4L of O2 via nasal cannula baseline) and BP (MD  approved for therapy with BP <180/110) Restrictions Weight Bearing Restrictions: No   Vital Signs: Oxygen Therapy SpO2: 98 % O2 Device: Nasal Cannula O2 Flow Rate (L/min): 4 L/min Pain: Pain Assessment Pain Scale: Faces Pain Score: 0-No pain ADL: See Care Tool Section for some details of mobility and selfcare  Therapy/Group: Individual Therapy  Karinna Beadles OTR/L 11/12/2018, 12:39 PM

## 2018-11-12 NOTE — Progress Notes (Signed)
Physical Therapy Session Note  Patient Details  Name: Denise [name] MRN: [id number] Date of Birth: 1945/02/26  Today's Date: 11/12/2018 PT Individual Time: 8177-1165 PT Individual Time Calculation (min): 38 min   and  Today's Date: 11/12/2018 PT Missed Time: 22 Minutes Missed Time Reason: Patient fatigue  Short Term Goals: Week 1:  PT Short Term Goal 1 (Week 1): Pt will perform transfers with CGA PT Short Term Goal 2 (Week 1): Pt will ambulate at least 199f using LRAD with CGA PT Short Term Goal 3 (Week 1): Pt will ascend/descend 4 steps using handrails with CGA  Skilled Therapeutic Interventions/Progress Updates:    Pt received sitting with HOB elevated and her husband present - pt reports she has just finished with another therapy session and is tired at this time. Therapist offered to return in 30 minutes to attempt a shortened session at that time and pt agreeable.  Therapist returned as agreed upon - pt received sitting EOB with her husband present and pt agreeable to therapy session. Pt received and maintained on 4L of O2 via nasal cannula. Sit<>stands, no AD, with CGA for steadying. Assessed vitals: BP 139/97 (MAP 109), HR 86bpm, SpO2 90-93%. Ambulated ~432f no AD, with CGA for steadying and +2 w/c follow in event of patient fatigue - pt requesting seated rest break - SpO2 87% upon sitting down and after ~20seconds was not increasing therefore therapist increased to 6L of O2 via nasal cannula with SpO2 initially increasing to 90% but within 30 seconds decreased to 84% - continued to cue for pursed lip breathing and after ~24m49mte SpO2 recovered to 92%. Transported to nurses station in w/c. Returned to 4L of O2 as pt's SpO2 was 93-95%. Therapist spoke with Pam, PA, regarding pt's SpO2 decreasing with shorter distance ambulation compared to at eval and that pt appears to be overall fatigued this afternoon - Pam specified to maintain pt's SpO2 >88%. Pt ambulated ~54f20fo AD, with  CGA for steadying and again pt's SpO2 decreased to 84% therefore therapist increased O2 to 6L and during seated rest break it decreased to 79% recovering to 88% in 1min31meconds and then 90% after another 30 seconds. Transported pt back to room in w/c. Stand pivot w/c>EOB with CGA for steadying. Sit>supine mod-I with increased time. Pt left supine in bed, HOB partially elevated, on 4L of O2 via nasal cannula with SpO2 91%, bed alarm on, and pt's husband present. Therapist notified RN of pt's decreased SpO2 with activity on 4Ls of O2 this session.  Therapy Documentation Precautions:  Precautions Precautions: Fall, Other (comment) Precaution Comments: monitor SpO2 sats (on 4L of O2 via nasal cannula baseline) and BP (MD approved for therapy with BP <180/110) Restrictions Weight Bearing Restrictions: No  Pain: Denies pain during session.   Therapy/Group: Individual Therapy  CarlyTawana Scale DPT 11/12/2018, 1:03 PM

## 2018-11-12 NOTE — Progress Notes (Signed)
[facility] PHYSICAL MEDICINE & REHABILITATION PROGRESS NOTE   Subjective/Complaints:  .  No issues overnite  ROS- neg CP, SOB, N/V/D Objective:   No results found. Recent Labs    11/11/18 0639  WBC 5.1  HGB 11.5*  HCT 36.6  PLT 158   Recent Labs    11/11/18 0639  NA 143  K 4.6  CL 103  CO2 31  GLUCOSE 117*  BUN 15  CREATININE 1.19*  CALCIUM 10.6*    Intake/Output Summary (Last 24 hours) at 11/12/2018 0739 Last data filed at 11/11/2018 1853 Gross per 24 hour  Intake 490 ml  Output -  Net 490 ml     Physical Exam: Vital Signs Blood pressure (!) 170/93, pulse 79, temperature 98.3 F (36.8 C), resp. rate 15, height _0  (1.676 m), weight 46.2 kg, SpO2 98 %.   General: No acute distress, sitting up in bed.  Mood and affect are appropriate Heart:Irreg irreg no murmur Lungs: Clear to auscultation, breathing unlabored, no rales or wheezes Abdomen: Positive bowel sounds, soft nontender to palpation, nondistended Extremities: No clubbing, cyanosis, or edema Skin: No evidence of breakdown, no evidence of rash Neurologic: Cranial nerves II through XII intact, motor strength is 5/5 in bilateral deltoid, bicep, tricep, grip, hip flexor, knee extensors, ankle dorsiflexor and plantar flexor Sensory exam normal sensation to light touch and proprioception in bilateral upper and lower extremities Cerebellar exam Mild dysmetria Left FNF, could not cooperate with Heel SHin testing  Musculoskeletal: Full range of motion in all 4 extremities. No joint swelling.    Assessment/Plan: 1. Functional deficits secondary to Right cerebellar ICH and Left cerebellar infarct  which require 3+ hours per day of interdisciplinary therapy in a comprehensive inpatient rehab setting.  Physiatrist is providing close team supervision and 24 hour management of active medical problems listed below.  Physiatrist and rehab team continue to assess barriers to discharge/monitor patient progress  toward functional and medical goals  Care Tool:  Bathing  Bathing activity did not occur: Safety/medical concerns(BP elevated and O2 low) Body parts bathed by patient: Right arm, Left arm, Chest, Abdomen, Front perineal area, Buttocks, Face     Body parts n/a: Right upper leg, Left upper leg, Left lower leg, Right lower leg   Bathing assist Assist Level: Minimal Assistance - Patient > 75%     Upper Body Dressing/Undressing Upper body dressing   What is the patient wearing?: Pull over shirt    Upper body assist Assist Level: Supervision/Verbal cueing    Lower Body Dressing/Undressing Lower body dressing      What is the patient wearing?: Pants     Lower body assist Assist for lower body dressing: Moderate Assistance - Patient 50 - 74%     Toileting Toileting    Toileting assist Assist for toileting: Minimal Assistance - Patient > 75%     Transfers Chair/bed transfer  Transfers assist     Chair/bed transfer assist level: Contact Guard/Touching assist     Locomotion Ambulation   Ambulation assist      Assist level: Minimal Assistance - Patient > 75% Assistive device: No Device Max distance: 60 ft   Walk 10 feet activity   Assist     Assist level: Minimal Assistance - Patient > 75% Assistive device: No Device   Walk 50 feet activity   Assist    Assist level: Minimal Assistance - Patient > 75% Assistive device: No Device    Walk 150 feet activity   Assist Walk  150 feet activity did not occur: Safety/medical concerns         Walk 10 feet on uneven surface  activity   Assist     Assist level: Minimal Assistance - Patient > 75%(up/down ramp) Assistive device: Other (comment)(None)   Wheelchair     Assist Will patient use wheelchair at discharge?: No(TBD but do not anticipate)             Wheelchair 50 feet with 2 turns activity    Assist            Wheelchair 150 feet activity     Assist           Blood pressure (!) 170/93, pulse 79, temperature 98.3 F (36.8 C), resp. rate 15, height 5' 6" (1.676 m), weight 46.2 kg, SpO2 98 %.  Medical Problem List and Plan: 1.Impaired fait and mobilitysecondary to acute ischemic stroke with hemorrhagic transformation. CIR PT, OT, amb ~100' minA  2. Antithrombotics: -DVT/anticoagulation:Mechanical:Sequential compression devices, below kneeBilateral lower extremitiesBLE dopplers negative on 11/4 -antiplatelet therapy: ASA 325 mg daily 3.Headaches/Pain Management:Tylenol prn 4. Mood:LCSW to follow for evaluation and support.She has a history of anxiety and depression. -antipsychotic agents: N/A 5. Neuropsych: This patientiscapable of making decisions on herown behalf. 6. Skin/Wound Care:Keep incision C/D for 3 days. Monitor wound for healing. 7. Fluids/Electrolytes/Nutrition:Maintain adequate nutritional and hydration status.She has severe protein calorie nutrition and is currently on Mirtazepine 75m HS to stimulate appetite. 8. HTN: Monitor BP tid and avoid hypotension due to high grade L-VA stenosis.BP goals <160 due to cerebellar bleed. Currently on Lasix, aldactone and Imdur--metoprolol and Losartan on hold at this time. 9. T2DM: Hgb A1c- 7.0. Monitor BS ac/hs with SSIfor now.Was on glucotrol 2.5 mg daily prior to last admission. CBG (last 3)  Recent Labs    11/10/18 2100 11/11/18 0627 11/11/18 1212  GLUCAP 70 97 158*  good control  10. COPD:4 litersOxygen dependent.Respiratory status stable on Incruse and Breo daily.  11. Chronic systolic CHF: Monitor for signs of overload and check weight daily. Continue spironolactone, Imdur, Lipitor, ASA and lasix daily.  12. Hypokalemia: Will likely need daily supplement as still low.Will add kdur bid--check labs in am. 13. ABLA: CBC stable  11/9.  14. CKD?: BUN/SCr - 31/1.36 at admission likely due to recent diuresis. Continue to monitor  with serial checks. 15. Dysuria with incontinence: UA 50WBC , +yeast , neg bacteria, start diflucan  16.  Cardiac arrythmia- has loop, will repeat EKG, ? Afib   LOS: 4 days A FACE TO FACE EVALUATION WAS PERFORMED  ACharlett Blake11/10/2018, 7:39 AM

## 2018-11-13 ENCOUNTER — Inpatient Hospital Stay (HOSPITAL_COMMUNITY): Payer: Medicare HMO

## 2018-11-13 ENCOUNTER — Encounter (HOSPITAL_COMMUNITY): Payer: Medicare HMO | Admitting: Psychology

## 2018-11-13 ENCOUNTER — Inpatient Hospital Stay (HOSPITAL_COMMUNITY): Payer: Medicare HMO | Admitting: Speech Pathology

## 2018-11-13 ENCOUNTER — Inpatient Hospital Stay (HOSPITAL_COMMUNITY): Payer: Medicare HMO | Admitting: Occupational Therapy

## 2018-11-13 LAB — GLUCOSE, CAPILLARY
Glucose-Capillary: 119 mg/dL — ABNORMAL HIGH (ref 70–99)
Glucose-Capillary: 147 mg/dL — ABNORMAL HIGH (ref 70–99)
Glucose-Capillary: 192 mg/dL — ABNORMAL HIGH (ref 70–99)
Glucose-Capillary: 181 mg/dL — ABNORMAL HIGH (ref 70–99)

## 2018-11-13 MED ORDER — METOPROLOL TARTRATE 12.5 MG HALF TABLET
12.5000 mg | ORAL_TABLET | Freq: Two times a day (BID) | ORAL | Status: DC
  Administered 2018-11-13 – 2018-11-19 (×13): 12.5 mg via ORAL
  Filled 2018-11-13 (×13): qty 1

## 2018-11-13 NOTE — Progress Notes (Signed)
Occupational Therapy Session Note  Patient Details  Name: Denise [name] MRN: [id number] Date of Birth: Jan 15, 1945  Today's Date: 11/13/2018 OT Individual Time: 1015-1100 OT Individual Time Calculation (min): 45 min    Short Term Goals: Week 1:  OT Short Term Goal 1 (Week 1): Pt will dress EOB with vitals remaining WNL to demo improved endurance on 4L O2 OT Short Term Goal 2 (Week 1): Pt will transfer to toilet wiht CGA OT Short Term Goal 3 (Week 1): Pt will stand to compelte oral care wiht CGA to demo improved endurance  Skilled Therapeutic Interventions/Progress Updates:    1;1. Pt received EOB requiring to put on pants with no pain reported. Pt dons pant and socks with significnatly increased time/rest breaks d/t SOB. Vitals monitored throughout with documentation below. Pt requires CGA for standing balance to advance pants past hips. Pt completes toileting via stand pivot to w/c/toilet/EOB with CGA and no AD with VC for pursed lip breaything after transitional movement. Exited session with pt seated in bed, exit alarm on and call light in reach   135/87 BP O2 97 Post L sock drops to 83% requiring 4 min on 4 L to increase >93% Post R sock drops to 89 requiring 2 min to increase to 93% After toileting 70% requring 10L O2 and 4 min to increase to >90%  Therapy Documentation Precautions:  Precautions Precautions: Fall, Other (comment) Precaution Comments: monitor SpO2 sats (on 4L of O2 via nasal cannula baseline) and BP (MD approved for therapy with BP <180/110) Restrictions Weight Bearing Restrictions: No General:   Vital Signs: Oxygen Therapy SpO2: 97 % O2 Device: Nasal Cannula O2 Flow Rate (L/min): 4 L/min Pain:   ADL: ADL Eating: Moderate assistance Grooming: Not assessed Upper Body Bathing: Not assessed Lower Body Bathing: Not assessed Upper Body Dressing: Supervision/safety Where Assessed-Upper Body Dressing: Edge of bed Lower Body Dressing: Minimal  assistance Where Assessed-Lower Body Dressing: Edge of bed Toileting: Minimal assistance, Maximal cueing Where Assessed-Toileting: Glass blower/designer: Psychiatric nurse Method: Counselling psychologist: Systems analyst    Praxis   Exercises:   Other Treatments:     Therapy/Group: Individual Therapy  Tonny Branch 11/13/2018, 11:05 AM

## 2018-11-13 NOTE — Progress Notes (Signed)
Speech Language Pathology Daily Session Note  Patient Details  Name: Denise [name] MRN: [id number] Date of Birth: 02/27/1945  Today's Date: 11/13/2018 SLP Individual Time: 1100-1130 SLP Individual Time Calculation (min): 30 min  Short Term Goals: Week 1: SLP Short Term Goal 1 (Week 1): Pt will demonstrate ability to follow basic commands during functional tasks with min A verbal cues. SLP Short Term Goal 2 (Week 1): Pt will demonstrate basic problem solving during ADL tasks with min A verbal cues. SLP Short Term Goal 3 (Week 1): Pt will demonstrate intellectual awareness listing 2 phsyical and 2 cognitive deficits with min A verbal cues. SLP Short Term Goal 4 (Week 1): Pt will demonstrate sustained attention in 15 minute intervals during functional task with min A verbal cues. SLP Short Term Goal 5 (Week 1): Pt will demonstrate recall of novel, daily events with mod A verbal cues for compensatory aid.  Skilled Therapeutic Interventions:  Skilled treatment session focused on cognition goals. SLP facilitated session by providing supervision cues for recall of general information, Min A for recall of items presented within category lists related to people and things and Min A cues for recall of information presented in short paragraphs. Memory strategies presented and pt able to utilize throughout activities with Min A cues. [name] present and all questions answered to their satisfaction.      Pain    Therapy/Group: Individual Therapy  Caralee Morea 11/13/2018, 1:36 PM

## 2018-11-13 NOTE — Consult Note (Signed)
Neuropsychological Consultation   Patient:   Denise [name]. [place]   DOB:   05-02-1945  MR Number:  [id number]  Location:  [facility] [id number] [place] [address] Dept: [facility]: [phone]           Date of Service:   11/13/2018  Start Time:   2 PM End Time:   3 PM  Provider/Observer:  Ilean Skill, Psy.D.       Clinical Neuropsychologist       Billing Code/Service: [phone]  Chief Complaint:    ALMETA GEISEL is a 73 year old female with history of diabetes, HTN, COPD with chronic hypoxic respiratory failure, colon cancer.  Admitted on 11/04/2018 with sudden onset of left sided weakness and slurred speech.  Recent admissions for COPD exacerbation.  CT neg for bleed and treated with tPA.  Later developed severe HA with elevated BP and CT showed right cerebellar infarct with hemorrhagic transformation.  MRI/MRA reveled large right cerebellar hemorrhagic infarct with mass effect on posterior fossa and multiple additional small acute/subacute cortical infarcts in the R-MCA territory with multiple small chronic hemorrhagic/non hemorrhagic cortical infarcts in bilateral cerebral hemispheres.    Reason for Service:  Patient referred for neuropsychological consultation due to coping and adjustment issues.  Below is the HPI for the current admission.    WIO:XBDZHGD A Schnoebelen is a 73 year old female with history fo T2Dm, HTN, COPD with chronic hypoxic respiratory failure, colon cancer who was admitted on 11/04/18 with sudden onset of left sided weakness and slurred speech. Patient with recent admission 10/26-10/31/20 for COPD exacerbation with acute on chronic respiratory failure. CT head negative for bleed and she was treated with tPA. She developed severe HA with elevated BP later that evening with CT head showed right cerebellar infarct with hemorrhagic transformation and she  received cryoprecipitate and TXA. Elevated BP treated with cleveprex. MRI/MRA brain done revealing large right cerebellar hemorrhagic infarct with mass effect on posterior fossa and multiple additional small acute/subacute cortical infarcts in R-MCA territory with multiple small chronic hemorrhagic/non hemorrhargic cortical infarcts in bilateral cerebral hemispheres. Carotid dopplers was negative for significant stenosis. 2 D echo showed EF 60-65% with severe LVH and grde one diastolic dysfunction.   Serial CT 11/5 showed new small to moderate left cerebellar infarct. Dr. Erlinda Hong felt that stroke could be due to high grade stenosis L-VA with relative low BP but likely embolic and loop recorder recommended to rule out embolic source. BLE dopplers negative for DVT. She was started on 325 mg ASA for secondary stroke prevention and loop placed 11/6 by Dr. Curt Bears. Therapy ongoing and patient limited by balance deficits with left sided weakness. CIR recommended due to functional decline.  She and her husband would like inpatient rehabilitation to help Mrs. Riester gain strength and improve her balance before she returns home. She denies any problems with sleep, pain, or constipation. She says her goal is to be able to pick things up better. She was able to walk with therapy today. Her husband states that he and his wife are retired and that he will be home with her upon discharge.  Current Status:  The patient and her husband were sitting in chairs with pt with oxygen.  Patient in good mood with only mild mental status issues.  Husband would help with questions at times.  Patient denied depressive or anxiety symptoms.    Behavioral Observation: [name]. Marvel Plan  presents as a 73 y.o.-year-old Right African American Female who appeared her stated age. her dress was Appropriate and she was Well Groomed and her manners were Appropriate to the situation.  her participation was indicative of Appropriate and  Redirectable behaviors.  There were any physical disabilities noted.  she displayed an appropriate level of cooperation and motivation.     Interactions:    Active Appropriate and Redirectable  Attention:   abnormal and attention span appeared shorter than expected for age  Memory:   abnormal; remote memory intact, recent memory impaired  Visuo-spatial:  not examined  Speech (Volume):  low  Speech:   normal; slowed response times  Thought Process:  Coherent and Relevant  Though Content:  WNL; not suicidal and not homicidal  Orientation:   person, place and time/date  Judgment:   Good  Planning:   Poor  Affect:    Appropriate  Mood:    Euthymic  Insight:   Good  Intelligence:   normal  Medical History:   Past Medical History:  Diagnosis Date  . Altered mental status   . Anxiety and depression   . Chronic respiratory failure with hypoxia ([facility]) 10/02/2017   Assoc with ? Cor pulmonale dx 09/2017 so rx 02 1-2 lpm 24/7  - 10/17/2017  Walked RA x 3 laps @ 185 ft each stopped due to  End of study, nl pace,  desat to 87 on 3rd lap - 12/19/2017  Saturations on Room Air at Rest =91 % and while Ambulating = 87%  But  on  2 Liters of pulsed oxygen while Ambulating =93% so rec POC 2lpm walking / use at rest if sats under 90%      . Colon cancer ([facility]) 1988   Resected  . COPD (chronic obstructive pulmonary disease) ([facility])   . Diabetes mellitus without complication ([name])    diet controlled- no meds. per pt  . Diarrhea 04/02/2018  . Diverticulosis   . Hyperlipidemia   . Hypertension   . Hypoxia 10/30/2018  . Insomnia   . Primary hyperparathyroidism ([facility])   . SBO (small bowel obstruction) ([facility])   . Stroke ([facility]) 10/2018   tpa administered  . Tubular adenoma of rectum 02/23/2013   low grade  . Vitamin D deficiency      Psychiatric History:  Patient with past history of anxiety and depression.  Denies significant exacerbation of anxiety currently.    Family Med/Psych  History:  Family History  Problem Relation Age of Onset  . Ovarian cancer Mother   . Breast cancer Neg Hx   . Hyperparathyroidism Neg Hx   . Colon cancer Neg Hx   . Esophageal cancer Neg Hx   . Stomach cancer Neg Hx    Impression/DX:  Denise [name] is a 73 year old female with history of diabetes, HTN, COPD with chronic hypoxic respiratory failure, colon cancer.  Admitted on 11/04/2018 with sudden onset of left sided weakness and slurred speech.  Recent admissions for COPD exacerbation.  CT neg for bleed and treated with tPA.  Later developed severe HA with elevated BP and CT showed right cerebellar infarct with hemorrhagic transformation.  MRI/MRA reveled large right cerebellar hemorrhagic infarct with mass effect on posterior fossa and multiple additional small acute/subacute cortical infarcts in the R-MCA territory with multiple small chronic hemorrhagic/non hemorrhagic cortical infarcts in bilateral cerebral hemispheres.    The patient and her husband were sitting in chairs with pt with oxygen.  Patient in good mood with  only mild mental status issues.  Husband would help with questions at times.  Patient denied depressive or anxiety symptoms.  Disposition/Plan:  Worked on coping issues with current medical issues and monitored for possible exacerbation of anxiety or depression.        Electronically Signed   _______________________ Ilean Skill, Psy.D.

## 2018-11-13 NOTE — Progress Notes (Signed)
Physical Therapy Note  Patient Details  Name: Denise [name] MRN: [id number] Date of Birth: October 25, 1945 Today's Date: 11/13/2018    [name] plan of care adjusted to 15/7 after speaking with care team and discussed with MD in team conference as pt currently unable to tolerate current therapy schedule with OT, PT, and SLP.    Netta Corrigan, PT, DPT, CSRS 11/13/2018, 10:50 AM

## 2018-11-13 NOTE — Progress Notes (Signed)
[facility] PHYSICAL MEDICINE & REHABILITATION PROGRESS NOTE   Subjective/Complaints:  .  Poor endurance , with chronic debility due to COPD now with CVA   ROS- neg CP, SOB, N/V/D Objective:   No results found. Recent Labs    11/11/18 0639  WBC 5.1  HGB 11.5*  HCT 36.6  PLT 158   Recent Labs    11/11/18 0639  NA 143  K 4.6  CL 103  CO2 31  GLUCOSE 117*  BUN 15  CREATININE 1.19*  CALCIUM 10.6*    Intake/Output Summary (Last 24 hours) at 11/13/2018 0750 Last data filed at 11/12/2018 1906 Gross per 24 hour  Intake 540 ml  Output -  Net 540 ml     Physical Exam: Vital Signs Blood pressure (!) 157/96, pulse 79, temperature 97.8 F (36.6 C), temperature source Oral, resp. rate 16, height _0  (1.676 m), weight 46.4 kg, SpO2 100 %.   General: No acute distress, sitting up in bed.  Mood and affect are appropriate Heart:Irreg irreg no murmur Lungs: Clear to auscultation, breathing unlabored, no rales or wheezes Abdomen: Positive bowel sounds, soft nontender to palpation, nondistended Extremities: No clubbing, cyanosis, or edema Skin: No evidence of breakdown, no evidence of rash Neurologic: Cranial nerves II through XII intact, motor strength is 5/5 in bilateral deltoid, bicep, tricep, grip, hip flexor, knee extensors, ankle dorsiflexor and plantar flexor Sensory exam normal sensation to light touch and proprioception in bilateral upper and lower extremities Cerebellar exam Mild dysmetria Left FNF, could not cooperate with Heel SHin testing  Musculoskeletal: Full range of motion in all 4 extremities. No joint swelling.    Assessment/Plan: 1. Functional deficits secondary to Right cerebellar ICH and Left cerebellar infarct  which require 3+ hours per day of interdisciplinary therapy in a comprehensive inpatient rehab setting.  Physiatrist is providing close team supervision and 24 hour management of active medical problems listed below.  Physiatrist and rehab  team continue to assess barriers to discharge/monitor patient progress toward functional and medical goals  Care Tool:  Bathing  Bathing activity did not occur: Safety/medical concerns(BP elevated and O2 low) Body parts bathed by patient: Right arm, Left arm, Chest, Abdomen, Front perineal area, Buttocks, Face     Body parts n/a: Right upper leg, Left upper leg, Left lower leg, Right lower leg   Bathing assist Assist Level: Minimal Assistance - Patient > 75%     Upper Body Dressing/Undressing Upper body dressing   What is the patient wearing?: Pull over shirt    Upper body assist Assist Level: Supervision/Verbal cueing    Lower Body Dressing/Undressing Lower body dressing      What is the patient wearing?: Pants     Lower body assist Assist for lower body dressing: Moderate Assistance - Patient 50 - 74%     Toileting Toileting    Toileting assist Assist for toileting: Minimal Assistance - Patient > 75%     Transfers Chair/bed transfer  Transfers assist     Chair/bed transfer assist level: Contact Guard/Touching assist     Locomotion Ambulation   Ambulation assist      Assist level: Contact Guard/Touching assist Assistive device: No Device Max distance: 55f   Walk 10 feet activity   Assist     Assist level: Contact Guard/Touching assist Assistive device: No Device   Walk 50 feet activity   Assist    Assist level: Contact Guard/Touching assist Assistive device: No Device    Walk 150 feet activity  Assist Walk 150 feet activity did not occur: Safety/medical concerns  Assist level: Supervision/Verbal cueing Assistive device: No Device    Walk 10 feet on uneven surface  activity   Assist     Assist level: Minimal Assistance - Patient > 75%(up/down ramp) Assistive device: Other (comment)(None)   Wheelchair     Assist Will patient use wheelchair at discharge?: No Type of Wheelchair: Manual    Wheelchair assist level:  Supervision/Verbal cueing Max wheelchair distance: 150'    Wheelchair 50 feet with 2 turns activity    Assist        Assist Level: Supervision/Verbal cueing   Wheelchair 150 feet activity     Assist      Assist Level: Supervision/Verbal cueing   Blood pressure (!) 157/96, pulse 79, temperature 97.8 F (36.6 C), temperature source Oral, resp. rate 16, height _0  (1.676 m), weight 46.4 kg, SpO2 100 %.  Medical Problem List and Plan: 1.Impaired fait and mobilitysecondary to acute ischemic stroke with hemorrhagic transformation. CIR PT, OT, amb ~100' minA - would not push goals beyond household distances  .Team conference today please see physician documentation under team conference tab, met with team face-to-face to discuss problems,progress, and goals. Formulized individual treatment plan based on medical history, underlying problem and comorbidities. 2. Antithrombotics: -DVT/anticoagulation:Mechanical:Sequential compression devices, below kneeBilateral lower extremitiesBLE dopplers negative on 11/4 -antiplatelet therapy: ASA 325 mg daily 3.Headaches/Pain Management:Tylenol prn 4. Mood:LCSW to follow for evaluation and support.She has a history of anxiety and depression. -antipsychotic agents: N/A 5. Neuropsych: This patientiscapable of making decisions on herown behalf. 6. Skin/Wound Care:Keep incision C/D for 3 days. Monitor wound for healing. 7. Fluids/Electrolytes/Nutrition:Maintain adequate nutritional and hydration status.She has severe protein calorie nutrition and is currently on Mirtazepine 29m HS to stimulate appetite. 8. HTN: Monitor BP tid and avoid hypotension due to high grade L-VA stenosis.BP goals <160 due to cerebellar bleed. Currently on Lasix, aldactone and Imdur--metoprolol and Losartan on hold at this time. Vitals:   11/13/18 0442 11/13/18 0555  BP: (!) 173/113 (!) 157/96  Pulse: 85 79  Resp: 16    Temp: 97.8 F (36.6 C)   SpO2: 100%    Add back metoprolol  9. T2DM: Hgb A1c- 7.0. Monitor BS ac/hs with SSIfor now.Was on glucotrol 2.5 mg daily prior to last admission. CBG (last 3)  Recent Labs    11/12/18 1646 11/12/18 2108 11/13/18 0557  GLUCAP 185* 142* 119*  good control 11/10  10. COPD:4 litersOxygen dependent.Respiratory status stable on Incruse and Breo daily.  11. Chronic systolic CHF: Monitor for signs of overload and check weight daily. Continue spironolactone, Imdur, Lipitor, ASA and lasix daily.  12. Hypokalemia: Will likely need daily supplement as still low.Will add kdur bid--check labs in am. 13. ABLA: CBC stable  11/9.  14. CKD?: BUN/SCr - 31/1.36 at admission likely due to recent diuresis. Continue to monitor with serial checks. 15. Dysuria with incontinence: UA 50WBC , +yeast , neg bacteria, start diflucan  16.  Cardiac arrythmia- has loop, will repeat EKG, - PACs on EKG    LOS: 5 days A FACE TO FACE EVALUATION WAS PERFORMED  ACharlett Blake11/11/2018, 7:50 AM

## 2018-11-13 NOTE — Progress Notes (Signed)
Social Work Patient ID: Denise [name], female   DOB: 1945-04-29, 73 y.o.   MRN: [id number]  Met with pt and husband to inform of team conference goals supervision level and target discharge date 11/17. She feels she can make it until then and stay here. She was requesting to go home sooner but will endure. Husband is supportive and encouraging to her. She can see she has made progress and is working on her endurance. Husband is here daily and will work on discharge needs.

## 2018-11-13 NOTE — Progress Notes (Signed)
Physical Therapy Session Note  Patient Details  Name: Denise [name] MRN: [id number] Date of Birth: 1945-01-31  Today's Date: 11/13/2018 PT Individual Time: 1535-1620 PT Individual Time Calculation (min): 45 min   Short Term Goals: Week 1:  PT Short Term Goal 1 (Week 1): Pt will perform transfers with CGA PT Short Term Goal 2 (Week 1): Pt will ambulate at least 142f using LRAD with CGA PT Short Term Goal 3 (Week 1): Pt will ascend/descend 4 steps using handrails with CGA     Skilled Therapeutic Interventions/Progress Updates:  Pt sitting EOB.  Husband present.  No c/o pain.  Pt on 4L O2 via [place].  Stand pivot to w/c. O2 sats 90%. Pt/family ed for simulated car transfer to sedan ht seat, performed by husband.  Cues for guarding pt rather than attempting to lift her.  CGA for car transfer.  O2 sats in low 80s, difficult to assess as fingers are cold apparently with poor perfusion.  After 2 minutes, O2 still not up to 88%; PT increased O2 to 6L and sats increased to 90%.  Gait training with close supervision over level tile, x 80' with turns.  Distance limited by pt feeling "breathless".  O2 sats dropped to high 70s; cues for paced , pursed lips breathing but again sats did not rise past low 80s in 2 min.  PT increased O2 to 8L briefly.  After resting in sitting several more minutes, O2 sats 95%; decreased O2 to 6L.  L thumb seemed to be most reliable digit for pulse oximeter accurate reading.  W/c propulsion using bil LEs x 40' x 2; distance limited by fatigue.  Husband transferred pt back to sitting EOB.  O2 sats left on 4.5L and sats at 88% at end of session.  Bed alarm set and needs left at hand.       Therapy Documentation Precautions:  Precautions Precautions: Fall, Other (comment) Precaution Comments: monitor SpO2 sats (on 4L of O2 via nasal cannula baseline) and BP (MD approved for therapy with BP <180/110) Restrictions Weight Bearing Restrictions: No   Vital  Signs:  Patient Position (if appropriate): Sitting Oxygen Therapy SpO2: 98 % O2 Device: Nasal Cannula O2 Flow Rate (L/min): 4 L/min -4.5L at rest      Therapy/Group: Individual Therapy  Richele Strand 11/13/2018, 4:38 PM

## 2018-11-13 NOTE — Patient Care Conference (Signed)
Inpatient RehabilitationTeam Conference and Plan of Care Update Date: 11/13/2018   Time: 10:40 AM    Patient Name: Denise [name] Record Number: [id number]  Date of Birth: [birth year] Sex: Female         Room/Bed: 4M10C/4M10C-01 Payor Info: Payor: [employer] MEDICARE / Plan: [employer] [place] HMO / Product Type: *No Product type* /    Admit Date/Time:  11/08/2018  9:02 PM  Primary Diagnosis:  Cerebellar stroke, acute [facility])  Patient Active Problem List   Diagnosis Date Noted  . ICH (intracerebral hemorrhage) ([place]) s/p tPA administration 11/08/2018  . Hypertensive urgency 11/08/2018  . Cerebellar stroke, acute ([place]) 11/08/2018  . Acute ischemic stroke ([place]) 11/04/2018  . COPD exacerbation ([place]) 10/28/2018  . Acute on chronic systolic CHF (congestive heart failure) ([place]) 10/28/2018  . Elevated troponin 10/28/2018  . Lactic acidosis 10/28/2018  . GERD (gastroesophageal reflux disease) 10/28/2018  . Acute on chronic respiratory failure with hypercapnia ([place]) 10/28/2018  . Diabetes mellitus without complication-diet controled 10/28/2018  . On home oxygen therapy 07/12/2018  . CHF (congestive heart failure) ([place]) 05/16/2018  . Chronic respiratory failure with hypoxia and hypercapnia ([place]) 05/03/2018  . Borderline abnormal TFTs 05/02/2018  . Type 2 diabetes mellitus ([place]) 04/26/2018  . Unintended weight loss 04/26/2018  . Lower extremity edema 02/20/2018  . Chronic respiratory failure with hypoxia ([place]) 10/02/2017  . Hematuria 09/27/2017  . Pulmonary hypertension ([name]) c/w cor pulmonale   . Protein-calorie malnutrition, severe 09/17/2017  . Constipation   . Hyperparathyroidism, primary ([place]) 07/27/2016  . Loss of appetite 06/02/2016  . COPD GOLD  II   11/22/2015  . Vitamin D deficiency 11/22/2015  . Anxiety and depression 11/22/2015  . Insomnia 11/22/2015  . Hyperlipidemia 11/22/2015  . Essential hypertension 11/22/2015  . Hx of colonic polyps 02/10/2013  . History  of colon cancer 06/11/1986    Expected Discharge Date: Expected Discharge Date: 11/19/18  Team Members Present: Physician leading conference: Dr. Alysia Penna Social Worker Present: Ovidio Kin, LCSW Nurse Present: Isla Pence, RN Case manager: Karene Fry, RN PT Present: Michaelene Song, PT OT Present: Clyda Greener, OT SLP Present: Stormy Fabian, SLP PPS Coordinator present : Gunnar Fusi, SLP     Current Status/Progress Goal Weekly Team Focus  Bowel/Bladder   Continent of bowel and bladder LBM 11/11  remain continent  assess toileting needs qshift and PRN   Swallow/Nutrition/ Hydration             ADL's   Supervision for UB selfcare with min assist for LB selfcare and for toilet transfers  supervision overall  selfcare retraining balance retraining, transfer retraining, DME education, energy conservation,   Mobility   supervision bed mobility, CGA transfers without AD, varying CGA/min assist gait up to 150f without AD, min assist 4 stairs using B HRs  independent bed mobility, supervision transfers and gait up to 1533fusing LRAD, CGPothar transfers, supervision 4 steps using HRs  activity tolerance/cardiopulmonary endurance training, B LE strengthening, gait, dynamic standing balance, pt/family education   Communication             Safety/Cognition/ Behavioral Observations  sustained attention, awareness, recall  Min A to supervision  introduction of memory strategies, sustained attention and overall awareness   Pain   No c/o pain         Skin                *See Care Plan and progress notes for long  and short-term goals.     Barriers to Discharge  Current Status/Progress Possible Resolutions Date Resolved   Nursing                  PT  Inaccessible home environment;Medical stability;Home environment access/layout                 OT                  SLP                SW                Discharge Planning/Teaching Needs:  Home with husband who can  provide assist, here daily and seen pt in therapies. Neuro-psych to see today for coping      Team Discussion: Has COPD, on O2 4L at home, debilitated, balance issues, not eating well PTA, anxious.  RN - cont B/B, impulsive, L side weakness, no pain.  OT S UB self care, Min LB self care, sit to stand Min A, low endurance, goals S overall.  PT changed to 15/7, O2 down to 80% on 4L, husband assists, CGA/min A with walker 40'.  SLP working on basic problem solving, attention, min/S goals.   Revisions to Treatment Plan: N/A     Medical Summary Current Status: COPD on 4 L O2 chronically, poor endurance, Mild motor deficit from CVA Weekly Focus/Goal: D/C planning , caregiver education  Barriers to Discharge: Medical stability   Possible Resolutions to Barriers: see above   Continued Need for Acute Rehabilitation Level of Care: The patient requires daily medical management by a physician with specialized training in physical medicine and rehabilitation for the following reasons: Direction of a multidisciplinary physical rehabilitation program to maximize functional independence : Yes Medical management of patient stability for increased activity during participation in an intensive rehabilitation regime.: Yes Analysis of laboratory values and/or radiology reports with any subsequent need for medication adjustment and/or medical intervention. : Yes   I attest that I was present, lead the team conference, and concur with the assessment and plan of the team.   Retta Diones 11/13/2018, 4:06 PM  Team conference was held via web/ teleconference due to [place] - 19

## 2018-11-13 NOTE — Progress Notes (Signed)
Occupational Therapy Session Note  Patient Details  Name: Kathyrn A. Stanforth MRN: [id number] Date of Birth: [birth year]  Today's Date: 11/13/2018 OT Individual Time: 6168-3729 OT Individual Time Calculation (min): 28 min    Short Term Goals: Week 1:  OT Short Term Goal 1 (Week 1): Pt will dress EOB with vitals remaining WNL to demo improved endurance on 4L O2 OT Short Term Goal 2 (Week 1): Pt will transfer to toilet wiht CGA OT Short Term Goal 3 (Week 1): Pt will stand to compelte oral care wiht CGA to demo improved endurance  Skilled Therapeutic Interventions/Progress Updates:    Upon entering the room, pt seated on EOB eating lunch with husband present in the room. Pt and caregiver were agreeable to education this session while eating. OT provided pt and caregiver with paper handouts for energy conservation for self care, community mobility, and with home management. Pt and caregiver were active participants and asking appropriate questions and giving examples as well. OT asking caregiver to review further with pt and additional questions to be answered at next therapy session. Call bell and all needs within reach.   Therapy Documentation Precautions:  Precautions Precautions: Fall, Other (comment) Precaution Comments: monitor SpO2 sats (on 4L of O2 via nasal cannula baseline) and BP (MD approved for therapy with BP <180/110) Restrictions Weight Bearing Restrictions: No   ADL: ADL Eating: Moderate assistance Grooming: Not assessed Upper Body Bathing: Not assessed Lower Body Bathing: Not assessed Upper Body Dressing: Supervision/safety Where Assessed-Upper Body Dressing: Edge of bed Lower Body Dressing: Minimal assistance Where Assessed-Lower Body Dressing: Edge of bed Toileting: Minimal assistance, Maximal cueing Where Assessed-Toileting: Glass blower/designer: Psychiatric nurse Method: Counselling psychologist: Grab bars   Therapy/Group:  Individual Therapy  Gypsy Decant 11/13/2018, 1:30 PM

## 2018-11-14 ENCOUNTER — Inpatient Hospital Stay (HOSPITAL_COMMUNITY): Payer: Medicare HMO | Admitting: Physical Therapy

## 2018-11-14 ENCOUNTER — Inpatient Hospital Stay (HOSPITAL_COMMUNITY): Payer: Medicare HMO | Admitting: *Deleted

## 2018-11-14 ENCOUNTER — Inpatient Hospital Stay (HOSPITAL_COMMUNITY): Payer: Medicare HMO | Admitting: Occupational Therapy

## 2018-11-14 ENCOUNTER — Inpatient Hospital Stay (HOSPITAL_COMMUNITY): Payer: Medicare HMO | Admitting: Speech Pathology

## 2018-11-14 LAB — GLUCOSE, CAPILLARY
Glucose-Capillary: 128 mg/dL — ABNORMAL HIGH (ref 70–99)
Glucose-Capillary: 134 mg/dL — ABNORMAL HIGH (ref 70–99)
Glucose-Capillary: 185 mg/dL — ABNORMAL HIGH (ref 70–99)
Glucose-Capillary: 143 mg/dL — ABNORMAL HIGH (ref 70–99)

## 2018-11-14 NOTE — Progress Notes (Addendum)
[facility] PHYSICAL MEDICINE & REHABILITATION PROGRESS NOTE   Subjective/Complaints:  .  No issues overnite appetite is fair, nursing brings up K level   Pt and I discussed BP and D/C date   ROS- neg CP, SOB, N/V/D Objective:   No results found. No results for input(s): WBC, HGB, HCT, PLT in the last 72 hours. No results for input(s): NA, K, CL, CO2, GLUCOSE, BUN, CREATININE, CALCIUM in the last 72 hours.  Intake/Output Summary (Last 24 hours) at 11/14/2018 0808 Last data filed at 11/13/2018 1842 Gross per 24 hour  Intake 360 ml  Output -  Net 360 ml     Physical Exam: Vital Signs Blood pressure (!) 141/85, pulse 75, temperature 97.8 F (36.6 C), temperature source Oral, resp. rate 17, height _0  (1.676 m), weight 45.9 kg, SpO2 94 %.   General: No acute distress, sitting up in bed.  Mood and affect are appropriate Heart:Irreg irreg no murmur Lungs: Clear to auscultation, breathing unlabored, no rales or wheezes Abdomen: Positive bowel sounds, soft nontender to palpation, nondistended Extremities: No clubbing, cyanosis, or edema Skin: No evidence of breakdown, no evidence of rash Neurologic: Cranial nerves II through XII intact, motor strength is 5/5 in bilateral deltoid, bicep, tricep, grip, hip flexor, knee extensors, ankle dorsiflexor and plantar flexor Sensory exam normal sensation to light touch and proprioception in bilateral upper and lower extremities Cerebellar exam Mild dysmetria Left FNF, could not cooperate with Heel SHin testing  Musculoskeletal: Full range of motion in all 4 extremities. No joint swelling.    Assessment/Plan: 1. Functional deficits secondary to Right cerebellar ICH and Left cerebellar infarct  which require 3+ hours per day of interdisciplinary therapy in a comprehensive inpatient rehab setting.  Physiatrist is providing close team supervision and 24 hour management of active medical problems listed below.  Physiatrist and rehab team  continue to assess barriers to discharge/monitor patient progress toward functional and medical goals  Care Tool:  Bathing  Bathing activity did not occur: Safety/medical concerns(BP elevated and O2 low) Body parts bathed by patient: Right arm, Left arm, Chest, Abdomen, Front perineal area, Buttocks, Face     Body parts n/a: Right upper leg, Left upper leg, Left lower leg, Right lower leg   Bathing assist Assist Level: Minimal Assistance - Patient > 75%     Upper Body Dressing/Undressing Upper body dressing   What is the patient wearing?: Pull over shirt    Upper body assist Assist Level: Supervision/Verbal cueing    Lower Body Dressing/Undressing Lower body dressing      What is the patient wearing?: Pants     Lower body assist Assist for lower body dressing: Moderate Assistance - Patient 50 - 74%     Toileting Toileting    Toileting assist Assist for toileting: Minimal Assistance - Patient > 75%     Transfers Chair/bed transfer  Transfers assist     Chair/bed transfer assist level: Contact Guard/Touching assist     Locomotion Ambulation   Ambulation assist      Assist level: Supervision/Verbal cueing Assistive device: No Device Max distance: 80   Walk 10 feet activity   Assist     Assist level: Supervision/Verbal cueing Assistive device: No Device   Walk 50 feet activity   Assist    Assist level: Supervision/Verbal cueing Assistive device: No Device    Walk 150 feet activity   Assist Walk 150 feet activity did not occur: Safety/medical concerns  Assist level: Supervision/Verbal cueing Assistive device:  No Device    Walk 10 feet on uneven surface  activity   Assist     Assist level: Minimal Assistance - Patient > 75%(up/down ramp) Assistive device: Other (comment)(None)   Wheelchair     Assist Will patient use wheelchair at discharge?: No Type of Wheelchair: Manual    Wheelchair assist level: Supervision/Verbal  cueing Max wheelchair distance: 40    Wheelchair 50 feet with 2 turns activity    Assist        Assist Level: Supervision/Verbal cueing   Wheelchair 150 feet activity     Assist      Assist Level: Supervision/Verbal cueing   Blood pressure (!) 141/85, pulse 75, temperature 97.8 F (36.6 C), temperature source Oral, resp. rate 17, height _0  (1.676 m), weight 45.9 kg, SpO2 94 %.  Medical Problem List and Plan: 1.Impaired fait and mobilitysecondary to acute ischemic stroke with hemorrhagic transformation. CIR PT, OT, amb ~100' minA - would not push goals beyond household distances  ELOS 11/17 2. Antithrombotics: -DVT/anticoagulation:Mechanical:Sequential compression devices, below kneeBilateral lower extremitiesBLE dopplers negative on 11/4 -antiplatelet therapy: ASA 325 mg daily 3.Headaches/Pain Management:Tylenol prn 4. Mood:LCSW to follow for evaluation and support.She has a history of anxiety and depression. -antipsychotic agents: N/A 5. Neuropsych: This patientiscapable of making decisions on herown behalf. 6. Skin/Wound Care:Keep incision C/D for 3 days. Monitor wound for healing. 7. Fluids/Electrolytes/Nutrition:Maintain adequate nutritional and hydration status.She has severe protein calorie nutrition and is currently on Mirtazepine 26m HS to stimulate appetite. 8. HTN: Monitor BP tid and avoid hypotension due to high grade L-VA stenosis.BP goals <160 due to cerebellar bleed. Currently on Lasix, aldactone and Imdur--metoprolol and Losartan on hold at this time. Vitals:   11/13/18 2016 11/14/18 0619  BP: (!) 138/98 (!) 141/85  Pulse: 84 75  Resp: 18 17  Temp: (!) 97.5 F (36.4 C) 97.8 F (36.6 C)  SpO2: 94% 94%    metoprolol 12.573mBID just restarted will monitor response  9. T2DM: Hgb A1c- 7.0. Monitor BS ac/hs with SSIfor now.Was on glucotrol 2.5 mg daily prior to last admission. CBG (last 3)   Recent Labs    11/13/18 1634 11/13/18 2103 11/14/18 0604  GLUCAP 192* 181* 128*  good control 11/12 10. COPD:4 litersOxygen dependent.Respiratory status stable on Incruse and Breo daily.  11. Chronic systolic CHF: Monitor for signs of overload and check weight daily. Continue spironolactone, Imdur, Lipitor, ASA and lasix daily.  12. Hypokalemia: Will likely need daily supplement as still low.Will add kdur bid--check labs in am. 13. ABLA: CBC stable  11/9.  14. CKD?: BUN/SCr - 31/1.36 at admission likely due to recent diuresis. Continue to monitor with serial checks. 15. Dysuria with incontinence: UA 50WBC , +yeast , neg bacteria, start diflucan  16.  Cardiac arrythmia- has loop, will repeat EKG, - PACs on EKG    LOS: 17.  Hx of Hypo K on Lasix and aldactone also on Kdur 2051mwill recheck BMET in am  6 days A FACE TO FACGrandviewKirsteins 11/14/2018, 8:08 AM

## 2018-11-14 NOTE — Progress Notes (Signed)
Speech Language Pathology Daily Session Note  Patient Details  Name: Denise [name]. Pankowski MRN: [id number] Date of Birth: Jun 27, 1945  Today's Date: 11/14/2018 SLP Individual Time: 0830-0900 SLP Individual Time Calculation (min): 30 min  Short Term Goals: Week 1: SLP Short Term Goal 1 (Week 1): Pt will demonstrate ability to follow basic commands during functional tasks with min A verbal cues. SLP Short Term Goal 2 (Week 1): Pt will demonstrate basic problem solving during ADL tasks with min A verbal cues. SLP Short Term Goal 3 (Week 1): Pt will demonstrate intellectual awareness listing 2 phsyical and 2 cognitive deficits with min A verbal cues. SLP Short Term Goal 4 (Week 1): Pt will demonstrate sustained attention in 15 minute intervals during functional task with min A verbal cues. SLP Short Term Goal 5 (Week 1): Pt will demonstrate recall of novel, daily events with mod A verbal cues for compensatory aid.  Skilled Therapeutic Interventions: Skilled treatment session focused on cognitive goals. SLP facilitated session by providing Min-Mod A verbal cues for basic problem solving during a 6-step picture sequencing task. Min A verbal cues were also utilized to maximize word-finding during a verbal description task. Patient requested to use the bathroom with extra time needed to complete task due to shortness of breath despite 4L of O2. Patient left upright in bed with alarm on and all needs within reach. Continue with current plan of care.      Pain No/Denies Pain   Therapy/Group: Individual Therapy  Shmiel Morton, [place] 11/14/2018, 12:14 PM

## 2018-11-14 NOTE — Progress Notes (Signed)
Occupational Therapy Session Note  Patient Details  Name: Cire A. Goddard MRN: [id number] Date of Birth: 10-22-1945  Today's Date: 11/14/2018 OT Individual Time: 1304-1400 OT Individual Time Calculation (min): 56 min    Short Term Goals: Week 1:  OT Short Term Goal 1 (Week 1): Pt will dress EOB with vitals remaining WNL to demo improved endurance on 4L O2 OT Short Term Goal 2 (Week 1): Pt will transfer to toilet wiht CGA OT Short Term Goal 3 (Week 1): Pt will stand to compelte oral care wiht CGA to demo improved endurance  Skilled Therapeutic Interventions/Progress Updates:    Pt in bedside recliner with spouse present to start session.  She agreed to ambulate down to the therapy gym for session.  Min guard assist for mobility half way to the ortho gym before needing to sit and rest secondary to fatigue.  Oxygen maintained at 4Ls throughout session with sats decreasing from 96% at rest to 80% with mobility.It took approximately 2 mins of purse lip breathing to bring her sats back up to 90%.  Mod demonstrational cueing for breathing technique.  Finished mobility to the therapy gym where she worked on dynamic standing balance and LUE strengthening.  She worked on stepping on and off of the foam pad as well as standing on the pad while completing head turns and body turns.  Min guard assist needed for standing balance throughout with only one LOB noted requiring min assist to correct.  Shje needed frequent rest breaks.  Had pt complete BUE shoulder flexion with 1/2 pound ball and min assist while sitting.  Also had her work on catch and toss of the ball as well with 90% accuracy.  Finished session with transfer back to the wheelchair and pt being rolled back to the room.  She completed wheelchair to bed transfer with min guard assist but then requested to go to the bathroom.  Pt left with NT to complete toilet transfer.  Oxygen sats throughout session would decrease below 85% with activity and  required 2 mins to increase back up above 90%.    Therapy Documentation Precautions:  Precautions Precautions: Fall, Other (comment) Precaution Comments: monitor SpO2 sats (on 4L of O2 via nasal cannula baseline) and BP (MD approved for therapy with BP <180/110) Restrictions Weight Bearing Restrictions: No  Pain: Pain Assessment Pain Scale: Faces Faces Pain Scale: No hurt ADL: See Care Tool Section for some details of mobility and selfcare  Therapy/Group: Individual Therapy  Naphtali Zywicki OTR/L 11/14/2018, 2:16 PM

## 2018-11-14 NOTE — Evaluation (Signed)
Recreational Therapy Assessment and Plan  Patient Details  Name: Denise [name]. Boak MRN: [id number] Date of Birth: 09/02/1945 Today's Date: 11/14/2018  Rehab Potential: Good ELOS: 11/19   Assessment  Problem List:      Patient Active Problem List   Diagnosis Date Noted  . ICH (intracerebral hemorrhage) ([place]) s/p tPA administration 11/08/2018  . Hypertensive urgency 11/08/2018  . Cerebellar stroke, acute ([place]) 11/08/2018  . Acute ischemic stroke ([place]) 11/04/2018  . COPD exacerbation ([place]) 10/28/2018  . Acute on chronic systolic CHF (congestive heart failure) ([place]) 10/28/2018  . Elevated troponin 10/28/2018  . Lactic acidosis 10/28/2018  . GERD (gastroesophageal reflux disease) 10/28/2018  . Acute on chronic respiratory failure with hypercapnia ([place]) 10/28/2018  . Diabetes mellitus without complication-diet controled 10/28/2018  . On home oxygen therapy 07/12/2018  . CHF (congestive heart failure) ([place]) 05/16/2018  . Chronic respiratory failure with hypoxia and hypercapnia ([place]) 05/03/2018  . Borderline abnormal TFTs 05/02/2018  . Type 2 diabetes mellitus ([place]) 04/26/2018  . Unintended weight loss 04/26/2018  . Lower extremity edema 02/20/2018  . Chronic respiratory failure with hypoxia ([place]) 10/02/2017  . Hematuria 09/27/2017  . Pulmonary hypertension ([name]) c/w cor pulmonale   . Protein-calorie malnutrition, severe 09/17/2017  . Constipation   . Hyperparathyroidism, primary ([place]) 07/27/2016  . Loss of appetite 06/02/2016  . COPD GOLD  II   11/22/2015  . Vitamin D deficiency 11/22/2015  . Anxiety and depression 11/22/2015  . Insomnia 11/22/2015  . Hyperlipidemia 11/22/2015  . Essential hypertension 11/22/2015  . Hx of colonic polyps 02/10/2013  . History of colon cancer 06/11/1986    Past Medical History:      Past Medical History:  Diagnosis Date  . Altered mental status   . Anxiety and depression   . Chronic respiratory failure with hypoxia ([place])  10/02/2017   Assoc with ? Cor pulmonale dx 09/2017 so rx 02 1-2 lpm 24/7  - 10/17/2017  Walked RA x 3 laps @ 185 ft each stopped due to  End of study, nl pace,  desat to 87 on 3rd lap - 12/19/2017  Saturations on Room Air at Rest =91 % and while Ambulating = 87%  But  on  2 Liters of pulsed oxygen while Ambulating =93% so rec POC 2lpm walking / use at rest if sats under 90%      . Colon cancer ([place]) 1988   Resected  . COPD (chronic obstructive pulmonary disease) ([place])   . Diabetes mellitus without complication ([name])    diet controlled- no meds. per pt  . Diarrhea 04/02/2018  . Diverticulosis   . Hyperlipidemia   . Hypertension   . Hypoxia 10/30/2018  . Insomnia   . Primary hyperparathyroidism ([place])   . SBO (small bowel obstruction) ([place])   . Stroke ([place]) 10/2018   tpa administered  . Tubular adenoma of rectum 02/23/2013   low grade  . Vitamin D deficiency    Past Surgical History:       Past Surgical History:  Procedure Laterality Date  . ABDOMINAL HYSTERECTOMY  08/2015  . BREAST BIOPSY Left   . BREAST EXCISIONAL BIOPSY Right   . COLON RESECTION  1980's  . COLONOSCOPY    . ENDOMETRIAL BIOPSY  2009   negative  . INCONTINENCE SURGERY  2017  . RIGHT HEART CATH N/A 09/20/2017   Procedure: RIGHT HEART CATH;  Surgeon: Larey Dresser, MD;  Location: [facility];  Service: Cardiovascular;  Laterality: N/A;  . RIGHT/LEFT HEART  CATH AND CORONARY ANGIOGRAPHY N/A 03/19/2018   Procedure: RIGHT/LEFT HEART CATH AND CORONARY ANGIOGRAPHY;  Surgeon: Larey Dresser, MD;  Location: [facility];  Service: Cardiovascular;  Laterality: N/A;    Assessment & Plan Clinical Impression:  Denise [name] is a 73 y.o. female past medical history of diabetes, hypertension, hyperlipidemia, colon cancer, COPD, chronic respiratory failure with hypoxia, systolic CHF with DOE admitted with sudden onset of left-sided weakness.  Found to have R cerebellar infarct s/p  tPA w/ hemorrhagic transformation  Pt presents with decreased activity tolerance, decreased functional mobility, decreased balance, decreased coordination, decreased awareness, decreased problem solving, decreased safety awareness and decreased memory Limiting pt's independence with leisure/community pursuits.  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, jewellery Other Leisure Interests: Reading;Television Leisure Participation Style: With Family/Friends Psychosocial / Spiritual Social interaction - Mood/Behavior: Cooperative Strengths/Weaknesses Patient Strengths/Abilities: Willingness to participate Patient weaknesses: Physical limitations  Plan Rec Therapy Plan Is patient appropriate for Therapeutic Recreation?: Yes Rehab Potential: Good Treatment times per week: Min 1 TR session >20 minutes Estimated Length of Stay: 11/19 TR Treatment/Interventions: Community reintegration;Patient/family education;Therapeutic exercise;1:1 session;Functional mobility training;Balance/vestibular training;Recreation/leisure participation;Therapeutic activities  Recommendations for other services: None   Discharge Criteria: Patient will be discharged from TR if patient refuses treatment 3 consecutive times without medical reason.  If treatment goals not met, if there is a change in medical status, if patient makes no progress towards goals or if patient is discharged from hospital.  The above assessment, treatment plan, treatment alternatives and goals were discussed and mutually agreed upon: by patient  [place] 11/14/2018, 3:37 PM

## 2018-11-14 NOTE — Progress Notes (Signed)
Physical Therapy Session Note  Patient Details  Name: Denise [name] MRN: [id number] Date of Birth: [birth year]  Today's Date: 11/14/2018 PT Individual Time: 9833-8250 PT Individual Time Calculation (min): 55 min   Short Term Goals: Week 1:  PT Short Term Goal 1 (Week 1): Pt will perform transfers with CGA PT Short Term Goal 2 (Week 1): Pt will ambulate at least 178f using LRAD with CGA PT Short Term Goal 3 (Week 1): Pt will ascend/descend 4 steps using handrails with CGA  Skilled Therapeutic Interventions/Progress Updates:    Pt received supine in bed, agreeable to PT session. No complaints of pain. Bed mobility independent. Pt requests to use the bathroom. Ambulation to bathroom with no AD and Supervision. Toilet transfer with CGA. Pt is Supervision for standing balance during pericare and clothing management. Pt requires extended seated rest break following toileting before rested enough to attempt ambulation. Sit to stand with Supervision from w/c seat. Ambulation x 50 ft with no AD and Supervision before onset of fatigue. Pt initially on 4L O2 at rest and with mobility during session, SpO2 drops to low 80's with activity. Increased O2 to 6L O2, with seated rest break and pursed lip breathing SpO2 returns to 90% (+). Ambulation x 25' with no AD and Supervision before onset of fatigue. Pt once again requires an extended seated rest break due to shortness of air, SpO2 remains at 90% (+) while on 6L O2. Pt requests to return to bed due to fatigue. Sit to supine independent. Pt left seated in bed with needs in reach at end of session, pt returned to 4L O2 at rest with SpO2 remaining in 90's at rest. [name] endurance a limiting factor with regards to mobility during this date.  Therapy Documentation Precautions:  Precautions Precautions: Fall, Other (comment) Precaution Comments: monitor SpO2 sats (on 4L of O2 via nasal cannula baseline) and BP (MD approved for therapy with BP  <180/110) Restrictions Weight Bearing Restrictions: No    Therapy/Group: Individual Therapy   TExcell Seltzer PT, DPT  11/14/2018, 12:47 PM

## 2018-11-15 ENCOUNTER — Inpatient Hospital Stay (HOSPITAL_COMMUNITY): Payer: Medicare HMO | Admitting: Speech Pathology

## 2018-11-15 ENCOUNTER — Ambulatory Visit (HOSPITAL_COMMUNITY): Payer: Medicare HMO | Admitting: Physical Therapy

## 2018-11-15 ENCOUNTER — Inpatient Hospital Stay (HOSPITAL_COMMUNITY): Payer: Medicare HMO | Admitting: Occupational Therapy

## 2018-11-15 LAB — BASIC METABOLIC PANEL
Anion gap: 9 (ref 5–15)
BUN: 22 mg/dL (ref 8–23)
CO2: 33 mmol/L — ABNORMAL HIGH (ref 22–32)
Calcium: 10.7 mg/dL — ABNORMAL HIGH (ref 8.9–10.3)
Chloride: 102 mmol/L (ref 98–111)
Creatinine, Ser: 1.18 mg/dL — ABNORMAL HIGH (ref 0.44–1.00)
GFR calc Af Amer: 53 mL/min — ABNORMAL LOW (ref >60)
GFR calc non Af Amer: 46 mL/min — ABNORMAL LOW (ref >60)
Glucose, Bld: 118 mg/dL — ABNORMAL HIGH (ref 70–99)
Potassium: 4.8 mmol/L (ref 3.5–5.1)
Sodium: 144 mmol/L (ref 135–145)

## 2018-11-15 LAB — GLUCOSE, CAPILLARY
Glucose-Capillary: 133 mg/dL — ABNORMAL HIGH (ref 70–99)
Glucose-Capillary: 203 mg/dL — ABNORMAL HIGH (ref 70–99)
Glucose-Capillary: 104 mg/dL — ABNORMAL HIGH (ref 70–99)
Glucose-Capillary: 305 mg/dL — ABNORMAL HIGH (ref 70–99)

## 2018-11-15 NOTE — Progress Notes (Signed)
Recreational Therapy Session Note  Patient Details  Name: Denise [name] MRN: [id number] Date of Birth: 09/24/1945 Today's Date: 11/15/2018 Time:  1134-12 Pain: no c/o Skilled Therapeutic Interventions/Progress Updates: session focused on functional ambulation, dynamic standing balance during co-treat with PT.  Pt on 4 L  Of O2, sats 88% and above throughout session.  PT ambulated with close supervision ~25 feet before needing seated rest break.  Pt taken to the gym to perform standing exercises with close supervision.  During seated rest breaks, pt engaged in leisure discussion sharing that she enjoyed walking and cooking.  Therapy/Group: Co-Treatment  [name] 11/15/2018, 12:42 PM

## 2018-11-15 NOTE — Progress Notes (Signed)
Occupational Therapy Weekly Progress Note  Patient Details  Name: Denise [name] MRN: [id number] Date of Birth: 1945/06/24  Beginning of progress report period: November 09, 2018 End of progress report period: November 15, 2018  Today's Date: 11/15/2018 OT Individual Time: 5188-4166 OT Individual Time Calculation (min): 42 min    Patient has met 2 of 3 short term goals.  Denise [name] is making steady progress with OT currently and is at a min guard assist for toilet transfers as well as shower/tub transfers with use of the tub bench.  LB selfcare is min guard to supervision level with setup for all UB selfcare.  Endurance continues to be limited secondary to her history of chronic COPD.  Oxygen sats have been maintained above 95% on 4Ls at rest, but tend to decrease down into the mid 80's when fatigued.  It usually takes 1-2 mins of purse lip breathing to bring them back up to 90%, and at times an increase in O2 to 6Ls.  She still exhibits some slight weakness and decreased coordination in the LUE but uses it functionally at a non-dominant level with selfcare tasks.  Feel she is on target to meet supervision level goals with expected discharge on 11/17.  Will continue with current OT treatment plan until discharge home on expected date barring no new medical changes.    Patient continues to demonstrate the following deficits: muscle weakness, decreased cardiorespiratoy endurance and decreased oxygen support, impaired timing and sequencing and decreased coordination, decreased awareness and decreased memory and decreased sitting balance, decreased standing balance and decreased balance strategies and therefore will continue to benefit from skilled OT intervention to enhance overall performance with BADL and Reduce care partner burden.  Patient progressing toward long term goals..  Continue plan of care.  OT Short Term Goals Week 2:  OT Short Term Goal 1 (Week 2): Continue working on  currentl LTGs set at supervision level.  Skilled Therapeutic Interventions/Progress Updates:    Pt seen for OT treatment with focus on some aspects of bathing, dressing, and toileting.  Pt initially confused on when she was leaving, thinking that it was tomorrow instead of the 17th.  She was easily re-directed and agreed to complete some bathing at the sink.  Min guard for transfer to the wheelchair at the sink.  Supervision for washing her face and arms.  She declined washing any other parts.  She completed washing peri area with min guard assist in standing, pushing her brief and pants down far enough to complete the task, and then pulling them back up.   She donned a new pair of gripper socks as well, but declined washing her feet.  She next, reported the need to complete toilet transfer, which she was able to do without an assistive device and min guard assist.  All aspects of toileting also completed at min guard assist.  Finished session with pt washing hands at the sink and transferring to the recliner to rest.  Call button and phone in reach with safety alarm belt in place.  During session pt's O2 sats at 96% on 4ls at rest.  With bathing tasks they would drop to 85-87% requiring increased rest breaks and purse lip breathing to increase them back up to above 90%.    Therapy Documentation Precautions:  Precautions Precautions: Fall, Other (comment) Precaution Comments: monitor SpO2 sats (on 4L of O2 via nasal cannula baseline) and BP (MD approved for therapy with BP <180/110) Restrictions Weight Bearing Restrictions: No  Vital Signs: Therapy Vitals Pulse Rate: 80 Resp: (!) 22 Patient Position (if appropriate): Sitting Oxygen Therapy SpO2: 93 % O2 Device: Nasal Cannula O2 Flow Rate (L/min): 4 L/min Pain: Pain Assessment Pain Scale: 0-10 Pain Score: 0-No pain ADL: See Care Tool for some details of mobility and selfcare  Therapy/Group: Individual Therapy  Mertie Haslem  OTR/L 11/15/2018, 1:01 PM

## 2018-11-15 NOTE — Progress Notes (Signed)
Physical Therapy Session Note  Patient Details  Name: Denise [name] MRN: [id number] Date of Birth: 05-Sep-1945   Today's Date: 11/15/2018 PT Individual Time: 1122-1207 PT Individual Time Calculation (min): 45 min   Short Term Goals: Week 1:  PT Short Term Goal 1 (Week 1): Pt will perform transfers with CGA PT Short Term Goal 2 (Week 1): Pt will ambulate at least 178f using LRAD with CGA PT Short Term Goal 3 (Week 1): Pt will ascend/descend 4 steps using handrails with CGA  Skilled Therapeutic Interventions/Progress Updates:    Pt received sitting in recliner reporting need to use bathroom. Pt received and maintained on 4L of O2 via nasal cannula. LLattie Haw Rec Therapist, present throughout session. Sit<>stands, no AD, with close supervision for safety throughout session. Ambulated ~178fto bathroom with CGA for safety. Standing LB clothing management with CGA. Performed seated peri-care without assistance - continent of urine. Ambulated ~14f49fo sink to perform standing hand-hygiene; however, due to increased labored breathing pt required seated rest break prior to completing task. Assessed SpO2 on L thumb using pulse oximeter throughout session. SpO2 80% recovering to 88% in 1 minute and 93% in 3 minutes. Ambulated ~19f24fo AD, with CGA for safety/steadying and +2 w/c follow in event of fatigue. SpO2 88%. Transported remainder of distance to therapy gym. Standing with B UE support on // bar performed B LE heel raises x15 reps. Stand pivot w/c<>EOM, no AD, with close supervision/CGA for safety/steadying. Dynamic standing balance task of 4 square stepping with CGA for steadying and pt demonstrating very small, slow steps. Ambulated ~19ft99fk towards room, no AD, with CGA for steadying and again +2 assist for w/c follow. SpO2 90%. Transported remainder of distance back to room in w/c. Stand pivot w/c>EOB, no AD, with close supervision. Pt left seated EOB, on 4L of O2 via nasal cannula, her husband  present, and bed alarm on.  Of note: patient continues to require seated rest breaks after each activity due to labored breathing and to maintain SpO2 within specified limits.   RN notified that pt's SpO2 decreased when ambulating to/from toileting.  Therapy Documentation Precautions:  Precautions Precautions: Fall, Other (comment) Precaution Comments: monitor SpO2 sats (on 4L of O2 via nasal cannula baseline) and BP (MD approved for therapy with BP <180/110) Restrictions Weight Bearing Restrictions: No  Pain:   Denies pain during session.   Therapy/Group: Individual Therapy  CarlyTawana Scale DPT 11/15/2018, 7:56 AM

## 2018-11-15 NOTE — ED Provider Notes (Signed)
Regarding patient ED presentation for code stroke 11\2\2020.  Patient was cleared by the ED resident to proceed to CT for code stroke.  I did interact with the patient's family in trauma room C regarding the event while the patient was in CT, but was not able to actually see the patient.  She was at that time undergoing imaging.  Decision was made to treat with TPA by neurology and patient was transferred to neuro ICU prior to me being able to perform examination of the patient.  Patient's care was managed by ED resident physician at the bridge vis--vis airway clearance for CT and neurology physician for stroke care and TPA management.   Charlesetta Shanks, MD 11/15/18 1218

## 2018-11-15 NOTE — Progress Notes (Signed)
Speech Language Pathology Weekly Progress and Session Note  Patient Details  Name: Denise [name]. Denise [name] MRN: [id number] Date of Birth: [birth year]  Beginning of progress report period: November 08, 2018 End of progress report period: November 15, 2018  Today's Date: 11/15/2018 SLP Individual Time: 7026-3785 SLP Individual Time Calculation (min): 55 min  Short Term Goals: Week 1: SLP Short Term Goal 1 (Week 1): Pt will demonstrate ability to follow basic commands during functional tasks with min A verbal cues. SLP Short Term Goal 1 - Progress (Week 1): Met SLP Short Term Goal 2 (Week 1): Pt will demonstrate basic problem solving during ADL tasks with min A verbal cues. SLP Short Term Goal 2 - Progress (Week 1): Progressing toward goal SLP Short Term Goal 3 (Week 1): Pt will demonstrate intellectual awareness listing 2 phsyical and 2 cognitive deficits with min A verbal cues. SLP Short Term Goal 3 - Progress (Week 1): Progressing toward goal SLP Short Term Goal 4 (Week 1): Pt will demonstrate sustained attention in 15 minute intervals during functional task with min A verbal cues. SLP Short Term Goal 4 - Progress (Week 1): Met SLP Short Term Goal 5 (Week 1): Pt will demonstrate recall of novel, daily events with mod A verbal cues for compensatory aid. SLP Short Term Goal 5 - Progress (Week 1): Met    New Short Term Goals: Week 2: SLP Short Term Goal 1 (Week 2): STG=LTG due to remaining LOS  Weekly Progress Updates: Pt has made functional gains and met 3 out of 5 short term goals. Pt is currently Min-Mod assist for due to word finding and comprehension deficits as well as cognitive impairments impacting basic problem solving, awareness, attention and sequencing. Pt has demonstrated improved sustained attention, use of word finding strategies, recall of day to day information, and use of compensatory memory strategies. Pt and family education is ongoing. Pt would continue to benefit from  skilled ST while inpatient in order to maximize functional independence and reduce burden of care prior to discharge. Anticipate that pt will need 24/7 supervision at discharge in addition to [place] follow up at next level of care.      Intensity: Minumum of 1-2 x/day, 30 to 90 minutes Frequency: 3 to 5 out of 7 days Duration/Length of Stay: 11/19/18 Treatment/Interventions: Cognitive remediation/compensation;Cueing hierarchy;Functional tasks;Internal/external aids;Patient/family education;Speech/Language facilitation   Daily Session  Skilled Therapeutic Interventions: Pt was seen for skilled ST targeting cognitive-linguistic skills. [name] was present and supportive throughout session. He is learning to assist with cueing for language and cognitive deficits and participates appropriately. In functional conversation regarding her hospitalization and current deficits, pt required multiple choice cues in order to identify 1 cognitive and 1 physical limitation. When presented with safety awareness cards, pt identified problems and safety implications with Supervision A question cues for problem solving, Min A cues also provided for word-finding during this task. SLP further facilitated session with a familiar 4-step action card sequencing task, during which pt required Mod A verbal and visual cues for problem solving. Pt was left sitting at the edge of her bed with husband still present, all needs within reach.        Pain Pain Assessment Pain Scale: Faces Faces Pain Scale: No hurt  Therapy/Group: Individual Therapy  Arbutus Leas 11/15/2018, 3:17 PM

## 2018-11-15 NOTE — Progress Notes (Signed)
Nutrition Follow-up  DOCUMENTATION CODES:   Severe malnutrition in context of chronic illness, Underweight  INTERVENTION:  Continue Ensure Enlive po TID, each supplement provides 350 kcal and 20 grams of protein  Encourage adequate PO intake.   NUTRITION DIAGNOSIS:   Severe Malnutrition related to chronic illness(COPD, CHF) as evidenced by severe fat depletion, severe muscle depletion; ongoing  GOAL:   Patient will meet greater than or equal to 90% of their needs; met  MONITOR:   PO intake, Supplement acceptance, Skin, Weight trends, Labs, I & O's  REASON FOR ASSESSMENT:   Other (Comment)(Low BMI, malnutrition)    ASSESSMENT:   73 year old female with history fo T2Dm, HTN, COPD with chronic hypoxic respiratory failure, colon cancer who was admitted on 11/04/18 with sudden onset of left sided weakness and slurred speech. CT head showed right cerebellar infarct with hemorrhagic transformation. MRI/MRA brain done revealing large right cerebellar hemorrhagic infarct with mass effect on posterior fossa and multiple additional small acute/subacute cortical infarcts in R-MCA territory with multiple small chronic hemorrhagic/non hemorrhargic cortical infarcts in bilateral cerebral hemispheres.  Meal completion has been 45-100%. Intake has been improving. Pt currently has Ensure ordered and has been consuming them. RD to continue with current orders to aid in caloric and protein needs. Pt encouraged to eat her food at meals and to drink her supplements.   Labs and medications reviewed.   Diet Order:   Diet Order            Diet heart healthy/carb modified Room service appropriate? Yes; Fluid consistency: Thin  Diet effective now              EDUCATION NEEDS:   Education needs have been addressed  Skin:  Skin Assessment: Reviewed RN Assessment  Last BM:  11/10  Height:   Ht Readings from Last 1 Encounters:  11/09/18 _0  (1.676 m)    Weight:   Wt Readings from  Last 1 Encounters:  11/15/18 45.8 kg    Ideal Body Weight:  59 kg  BMI:  Body mass index is 16.3 kg/m.  Estimated Nutritional Needs:   Kcal:  1550-1750  Protein:  70-80 grams  Fluid:  >/= 1.5 L/day    Corrin Parker, MS, RD, LDN Pager # [phone] After hours/ weekend pager # [phone]

## 2018-11-15 NOTE — Progress Notes (Signed)
[facility] PHYSICAL MEDICINE & REHABILITATION PROGRESS NOTE   Subjective/Complaints:  .  No issues overnight, denies pain or constipation, she is happy with her progress with therapy. BMP ordered today and shows Cr 1.18,   ROS- neg CP, SOB, N/V/D Objective:   No results found. No results for input(s): WBC, HGB, HCT, PLT in the last 72 hours. Recent Labs    11/15/18 0448  NA 144  K 4.8  CL 102  CO2 33*  GLUCOSE 118*  BUN 22  CREATININE 1.18*  CALCIUM 10.7*    Intake/Output Summary (Last 24 hours) at 11/15/2018 0947 Last data filed at 11/15/2018 4235 Gross per 24 hour  Intake 417 ml  Output -  Net 417 ml     Physical Exam: Vital Signs Blood pressure (!) 144/100, pulse 80, temperature 97.9 F (36.6 C), temperature source Oral, resp. rate (!) 22, height _0  (1.676 m), weight 45.8 kg, SpO2 93 %.   General: No acute distress, sitting up in bed.  Mood and affect are appropriate Heart:Irreg irreg no murmur Lungs: Clear to auscultation, breathing unlabored, no rales or wheezes Abdomen: Positive bowel sounds, soft nontender to palpation, nondistended Extremities: No clubbing, cyanosis, or edema Skin: No evidence of breakdown, no evidence of rash Neurologic: Cranial nerves II through XII intact, motor strength is 5/5 in bilateral deltoid, bicep, tricep, grip, hip flexor, knee extensors, ankle dorsiflexor and plantar flexor Sensory exam normal sensation to light touch and proprioception in bilateral upper and lower extremities Cerebellar exam Mild dysmetria Left FNF, could not cooperate with Heel SHin testing  Musculoskeletal: Full range of motion in all 4 extremities. No joint swelling.    Assessment/Plan: 1. Functional deficits secondary to Right cerebellar ICH and Left cerebellar infarct  which require 3+ hours per day of interdisciplinary therapy in a comprehensive inpatient rehab setting.  Physiatrist is providing close team supervision and 24 hour management of  active medical problems listed below.  Physiatrist and rehab team continue to assess barriers to discharge/monitor patient progress toward functional and medical goals  Care Tool:  Bathing  Bathing activity did not occur: Safety/medical concerns(BP elevated and O2 low) Body parts bathed by patient: Right arm, Left arm, Chest, Abdomen, Front perineal area, Buttocks, Face     Body parts n/a: Right upper leg, Left upper leg, Left lower leg, Right lower leg   Bathing assist Assist Level: Minimal Assistance - Patient > 75%     Upper Body Dressing/Undressing Upper body dressing   What is the patient wearing?: Pull over shirt    Upper body assist Assist Level: Supervision/Verbal cueing    Lower Body Dressing/Undressing Lower body dressing      What is the patient wearing?: Pants     Lower body assist Assist for lower body dressing: Moderate Assistance - Patient 50 - 74%     Toileting Toileting    Toileting assist Assist for toileting: Minimal Assistance - Patient > 75%     Transfers Chair/bed transfer  Transfers assist     Chair/bed transfer assist level: Contact Guard/Touching assist     Locomotion Ambulation   Ambulation assist      Assist level: Contact Guard/Touching assist Assistive device: No Device Max distance: 50'   Walk 10 feet activity   Assist     Assist level: Supervision/Verbal cueing Assistive device: No Device   Walk 50 feet activity   Assist    Assist level: Supervision/Verbal cueing Assistive device: No Device    Walk 150 feet activity  Assist Walk 150 feet activity did not occur: Safety/medical concerns  Assist level: Supervision/Verbal cueing Assistive device: No Device    Walk 10 feet on uneven surface  activity   Assist     Assist level: Minimal Assistance - Patient > 75%(up/down ramp) Assistive device: Other (comment)(None)   Wheelchair     Assist Will patient use wheelchair at discharge?: No Type  of Wheelchair: Manual    Wheelchair assist level: Supervision/Verbal cueing Max wheelchair distance: 40    Wheelchair 50 feet with 2 turns activity    Assist        Assist Level: Supervision/Verbal cueing   Wheelchair 150 feet activity     Assist      Assist Level: Supervision/Verbal cueing   Blood pressure (!) 144/100, pulse 80, temperature 97.9 F (36.6 C), temperature source Oral, resp. rate (!) 22, height 5' 6" (1.676 m), weight 45.8 kg, SpO2 93 %.  Medical Problem List and Plan: 1.Impaired gait and mobilitysecondary to acute ischemic stroke with hemorrhagic transformation. CIR PT, OT, amb ~100' minA - would not push goals beyond household distances  ELOS 11/17 2. Antithrombotics: -DVT/anticoagulation:Mechanical:Sequential compression devices, below kneeBilateral lower extremitiesBLE dopplers negative on 11/4 -antiplatelet therapy: ASA 325 mg daily 3.Headaches/Pain Management:Tylenol prn 4. Mood:LCSW to follow for evaluation and support.She has a history of anxiety and depression. -antipsychotic agents: N/A 5. Neuropsych: This patientiscapable of making decisions on herown behalf. 6. Skin/Wound Care:Keep incision C/D for 3 days. Monitor wound for healing. 7. Fluids/Electrolytes/Nutrition:Maintain adequate nutritional and hydration status.She has severe protein calorie nutrition and is currently on Mirtazepine 60m HS to stimulate appetite. 8. HTN: Monitor BP tid and avoid hypotension due to high grade L-VA stenosis.BP goals <160 due to cerebellar bleed. Currently on Lasix, aldactone and Imdur--metoprolol and Losartan on hold at this time. Vitals:   11/15/18 0839 11/15/18 0917  BP: (!) 144/100   Pulse: 78 80  Resp:  (!) 22  Temp:    SpO2:  93%    metoprolol 12.591mBID just restarted will monitor response, today BP continues to be elevated at 144/100, at which time she was given her metoprolol.  9. T2DM: Hgb A1c-  7.0. Monitor BS ac/hs with SSIfor now.Was on glucotrol 2.5 mg daily prior to last admission. CBG (last 3)  Recent Labs    11/14/18 1733 11/14/18 2115 11/15/18 0620  GLUCAP 185* 143* 133*  good control 11/12 10. COPD:4 litersOxygen dependent.Respiratory status stable on Incruse and Breo daily.  11. Chronic systolic CHF: Monitor for signs of overload and check weight daily. Continue spironolactone, Imdur, Lipitor, ASA and lasix daily.  12. Hypokalemia: Will likely need daily supplement as still low.Will add kdur bid--check labs in am. 13. ABLA: CBC stable  11/9.  14. CKD?: BUN/SCr - 31/1.36 at admission likely due to recent diuresis. Continue to monitor with serial checks.Currently Cr. 1.18.  15. Dysuria with incontinence: UA 50WBC , +yeast , neg bacteria, start diflucan  16.  Cardiac arrythmia- has loop, will repeat EKG, - PACs on EKG    LOS: 17.  Hx of Hypo K on Lasix and aldactone also on Kdur 2011mwill recheck BMET in am  7 days A FACE TO FACCenter [place]/13/2020, 9:47 AM

## 2018-11-16 ENCOUNTER — Inpatient Hospital Stay (HOSPITAL_COMMUNITY): Payer: Medicare HMO | Admitting: Occupational Therapy

## 2018-11-16 ENCOUNTER — Inpatient Hospital Stay (HOSPITAL_COMMUNITY): Payer: Medicare HMO

## 2018-11-16 DIAGNOSIS — I5022 Chronic systolic (congestive) heart failure: Secondary | ICD-10-CM

## 2018-11-16 DIAGNOSIS — D62 Acute posthemorrhagic anemia: Secondary | ICD-10-CM

## 2018-11-16 DIAGNOSIS — E876 Hypokalemia: Secondary | ICD-10-CM

## 2018-11-16 DIAGNOSIS — Z9981 Dependence on supplemental oxygen: Secondary | ICD-10-CM

## 2018-11-16 DIAGNOSIS — B3731 Acute candidiasis of vulva and vagina: Secondary | ICD-10-CM

## 2018-11-16 DIAGNOSIS — E1165 Type 2 diabetes mellitus with hyperglycemia: Secondary | ICD-10-CM

## 2018-11-16 DIAGNOSIS — B373 Candidiasis of vulva and vagina: Secondary | ICD-10-CM

## 2018-11-16 DIAGNOSIS — R7309 Other abnormal glucose: Secondary | ICD-10-CM

## 2018-11-16 LAB — GLUCOSE, CAPILLARY
Glucose-Capillary: 121 mg/dL — ABNORMAL HIGH (ref 70–99)
Glucose-Capillary: 187 mg/dL — ABNORMAL HIGH (ref 70–99)
Glucose-Capillary: 228 mg/dL — ABNORMAL HIGH (ref 70–99)
Glucose-Capillary: 134 mg/dL — ABNORMAL HIGH (ref 70–99)

## 2018-11-16 MED ORDER — POTASSIUM CHLORIDE CRYS ER 10 MEQ PO TBCR
10.0000 meq | EXTENDED_RELEASE_TABLET | Freq: Every day | ORAL | Status: DC
  Administered 2018-11-17 – 2018-11-19 (×3): 10 meq via ORAL
  Filled 2018-11-16 (×3): qty 1

## 2018-11-16 NOTE — Progress Notes (Signed)
Physical Therapy Weekly Progress Note  Patient Details  Name: Denise [name] MRN: [id number] Date of Birth: Nov 21, 1945  Beginning of progress report period: November 09, 2018 End of progress report period: November 16, 2018  Today's Date: 11/16/2018 PT Individual Time: 7915-0569 PT Individual Time Calculation (min): 57 min   Patient has met 2 of 3 short term goals.  Pt did not meet gait distance goal as she is only able to tolerate short distance gait or household distances which is her baseline secondary to poor cardiovascular endurance and hx of COPD, pt requires up to 6L-8L O2/min with activity during therapy sessions, she is CGA for gait and balance without an AD.   Patient continues to demonstrate the following deficits muscle weakness, decreased cardiorespiratoy endurance and decreased oxygen support and decreased standing balance and decreased balance strategies and therefore will continue to benefit from skilled PT intervention to increase functional independence with mobility.  Patient progressing toward long term goals..  Continue plan of care.  PT Short Term Goals Week 1:  PT Short Term Goal 1 (Week 1): Pt will perform transfers with CGA PT Short Term Goal 1 - Progress (Week 1): Met PT Short Term Goal 2 (Week 1): Pt will ambulate at least 164f using LRAD with CGA PT Short Term Goal 2 - Progress (Week 1): Discontinued (comment)(pt only able to ambulate household gait distances secondary to poor cadiovascular endurance) PT Short Term Goal 3 (Week 1): Pt will ascend/descend 4 steps using handrails with CGA PT Short Term Goal 3 - Progress (Week 1): Met Week 2:  PT Short Term Goal 1 (Week 2): STG=LTG due to ELOS  Skilled Therapeutic Interventions/Progress Updates:    Pt seated EOB upon PT arrival, agreeable to therapy tx and denies pain. Pt on 4L O2/min at rest, pt requesting to use the bathroom. Pt ambulated from bed>bathroom x 10 ft with CGA, continent of bladder. Min  assist in order to doff dirty pants/briefs and don cleans pants/underwear, pt maintained standing balance with CGA while performing pericare and pulling pants over hips. Pt ambulated to w/c x 10 ft and transported to the dayroom. Pt seated in w/c with SpO2 80%, therapist increased O2 to 6L O2/min for activity during this session. Pt used nustep this session working on cadiovascular endurance while on resistance level 2, performed x 2 minutes (SpO2 81%, returned to 92% following therapeutic seated rest break), performed 2 x 1 minute (SpO2 85%, returned to 93% following therapeutic rest break).Pt transferred back to w/c with CGA, instructed in LAQ and seated marches for HEP to work on strength and endurance, performed 2 x 10 of each. Pt transported back to room, transferred to bed with CGA and left seated with bed alarm set and needs in reach.    Therapy Documentation Precautions:  Precautions Precautions: Fall, Other (comment) Precaution Comments: monitor SpO2 sats (on 4L of O2 via nasal cannula baseline) and BP (MD approved for therapy with BP <180/110) Restrictions Weight Bearing Restrictions: No   Therapy/Group: Individual Therapy  ENetta Corrigan PT, DPT, CSRS 11/16/2018, 8:03 AM

## 2018-11-16 NOTE — Progress Notes (Signed)
Occupational Therapy Session Note  Patient Details  Name: Denise [name] MRN: [id number] Date of Birth: 09/30/1945  Today's Date: 11/16/2018 OT Individual Time: 1300-1400 OT Individual Time Calculation (min): 60 min    Short Term Goals: Week 1:  OT Short Term Goal 1 (Week 1): Pt will dress EOB with vitals remaining WNL to demo improved endurance on 4L O2 OT Short Term Goal 1 - Progress (Week 1): Not met OT Short Term Goal 2 (Week 1): Pt will transfer to toilet wiht CGA OT Short Term Goal 2 - Progress (Week 1): Met OT Short Term Goal 3 (Week 1): Pt will stand to compelte oral care wiht CGA to demo improved endurance OT Short Term Goal 3 - Progress (Week 1): Met  Skilled Therapeutic Interventions/Progress Updates: husband present for session and patient required clarifications to to answers or comments to clinician not related to subject or topic at hand....decreased orientation at times.  Patient on 4.5 L 02 upon this clincian's approach for OT.   She completed seated actvities edge of bed and standing activities.   02 dropped to 84 for both types activities.   She required time to sit and be cued for deep breathing (her breathing appeared rather shallow throughout session) to return to at 90.    She completed some left UE neuro reeducation within pain tolerance ROM.    As well, she completed toilet transfer via w/c and grab bar and 3:1 with CGA;   Toileting= extra time and close S for balance during her stoop/standing to wipe buttocks and pull up pants.  Patient left sitting on edge of bed at her preference with alarm engaged with very supportive husband sitting nearby.  Continue OT POC     Therapy Documentation Precautions:  Precautions Precautions: Fall, Other (comment) Precaution Comments: monitor SpO2 sats (on 4L of O2 via nasal cannula baseline) and BP (MD approved for therapy with BP <180/110) Restrictions Weight Bearing Restrictions: No    Vital Signs:   99 02 when  sitting unsupported still;  Immediately dropped to 83 or 84 upon standing or any mobility such as toileting or toilet transfers.    Returned to 99 after several breaths and moments to be stil and recover  Pain:denied    Therapy/Group: Individual Therapy  ,  [place] 11/16/2018, 3:37 PM  

## 2018-11-16 NOTE — Progress Notes (Signed)
[facility] PHYSICAL MEDICINE & REHABILITATION PROGRESS NOTE   Subjective/Complaints:  Patient seen sitting up in bed this morning.  She states she slept well overnight.  She has questions regarding discharge today.  ROS: Denies CP, SOB, N/V/D  Objective:   No results found. No results for input(s): WBC, HGB, HCT, PLT in the last 72 hours. Recent Labs    11/15/18 0448  NA 144  K 4.8  CL 102  CO2 33*  GLUCOSE 118*  BUN 22  CREATININE 1.18*  CALCIUM 10.7*    Intake/Output Summary (Last 24 hours) at 11/16/2018 1403 Last data filed at 11/16/2018 0745 Gross per 24 hour  Intake 462 ml  Output -  Net 462 ml     Physical Exam: Vital Signs Blood pressure (!) 144/88, pulse 77, temperature 98 F (36.7 C), resp. rate 18, height _0  (1.676 m), weight 46.7 kg, SpO2 100 %. Constitutional: No distress . Vital signs reviewed. HENT: Normocephalic.  Atraumatic. Eyes: EOMI. No discharge. Cardiovascular: No JVD. Respiratory: Normal effort.  No stridor.  + Penrose. GI: Non-distended. Skin: Warm and dry.  Intact. Psych: Normal mood.  Normal behavior. Musc: No edema in extremities.  No tenderness in extremities. Neuro: Alert Motor: Grossly 4+-5/5 throughout   Assessment/Plan: 1. Functional deficits secondary to Right cerebellar ICH and Left cerebellar infarct  which require 3+ hours per day of interdisciplinary therapy in a comprehensive inpatient rehab setting.  Physiatrist is providing close team supervision and 24 hour management of active medical problems listed below.  Physiatrist and rehab team continue to assess barriers to discharge/monitor patient progress toward functional and medical goals  Care Tool:  Bathing  Bathing activity did not occur: Safety/medical concerns(BP elevated and O2 low) Body parts bathed by patient: Right arm, Left arm, Buttocks, Front perineal area, Face     Body parts n/a: Chest, Abdomen, Left lower leg, Right lower leg, Left upper leg, Right upper  leg(Pt did not attempt this session)   Bathing assist Assist Level: Minimal Assistance - Patient > 75%     Upper Body Dressing/Undressing Upper body dressing   What is the patient wearing?: Pull over shirt    Upper body assist Assist Level: Supervision/Verbal cueing    Lower Body Dressing/Undressing Lower body dressing      What is the patient wearing?: Pants     Lower body assist Assist for lower body dressing: Supervision/Verbal cueing     Toileting Toileting    Toileting assist Assist for toileting: Minimal Assistance - Patient > 75%     Transfers Chair/bed transfer  Transfers assist     Chair/bed transfer assist level: Contact Guard/Touching assist     Locomotion Ambulation   Ambulation assist      Assist level: Contact Guard/Touching assist Assistive device: No Device Max distance: 10 ft   Walk 10 feet activity   Assist     Assist level: Contact Guard/Touching assist Assistive device: No Device   Walk 50 feet activity   Assist    Assist level: Contact Guard/Touching assist Assistive device: No Device    Walk 150 feet activity   Assist Walk 150 feet activity did not occur: Safety/medical concerns  Assist level: Supervision/Verbal cueing Assistive device: No Device    Walk 10 feet on uneven surface  activity   Assist     Assist level: Minimal Assistance - Patient > 75%(up/down ramp) Assistive device: Other (comment)(None)   Wheelchair     Assist Will patient use wheelchair at discharge?: No Type  of Wheelchair: Manual    Wheelchair assist level: Supervision/Verbal cueing Max wheelchair distance: 40    Wheelchair 50 feet with 2 turns activity    Assist        Assist Level: Supervision/Verbal cueing   Wheelchair 150 feet activity     Assist      Assist Level: Supervision/Verbal cueing   Blood pressure (!) 144/88, pulse 77, temperature 98 F (36.7 C), resp. rate 18, height _0  (1.676 m), weight  46.7 kg, SpO2 100 %.  Medical Problem List and Plan: 1.Impaired gait and mobilitysecondary to acute ischemic stroke with hemorrhagic transformation.  Continue CIR 2. Antithrombotics: -DVT/anticoagulation:Mechanical:Sequential compression devices, below kneeBilateral lower extremitiesBLE dopplers negative on 11/4 -antiplatelet therapy: ASA 325 mg daily 3.Headaches/Pain Management:Tylenol prn 4. Mood:LCSW to follow for evaluation and support.She has a history of anxiety and depression. -antipsychotic agents: N/A 5. Neuropsych: This patientiscapable of making decisions on herown behalf. 6. Skin/Wound Care:Monitor wound for healing. 7. Fluids/Electrolytes/Nutrition:Maintain adequate nutritional and hydration status.She has severe protein calorie nutrition and is currently on Mirtazepine 62m HS to stimulate appetite. 8. HTN: Monitor BP tid and avoid hypotension due to high grade L-VA stenosis.BP goals <160 due to cerebellar bleed. Currently on Lasix, aldactone and Imdur--Losartan on hold at this time. Vitals:   11/16/18 0742 11/16/18 0754  BP: (!) 144/88   Pulse: 77   Resp:    Temp:    SpO2:  100%   Labile on 11/14, monitor for trend 9. T2DM: Hgb A1c- 7.0. Monitor BS ac/hs with SSIfor now.Was on glucotrol 2.5 mg daily prior to last admission. CBG (last 3)  Recent Labs    11/15/18 2128 11/16/18 0609 11/16/18 1146  GLUCAP 305* 121* 187*   Labile on 11/14, monitor for trend, will consider resuming Glucotrol if persistent 10. COPD:4 litersOxygen dependent.Respiratory status stable on Incruse and Breo daily.  11. Chronic systolic CHF: Monitor for signs of overload and check weight daily. Continue spironolactone, Imdur, Lipitor, ASA and lasix daily.  Filed Weights   11/14/18 0500 11/15/18 0542 11/16/18 0500  Weight: 45.9 kg 45.8 kg 46.7 kg   Stable on 11/14 12. Hypokalemia:   Kdur bid, decreased dose and frequency on  11/14  Potassium 4.8 on 11/13, labs ordered for Monday 13. ABLA:   Hemoglobin 11.5 on 11/9, labs ordered for Monday 14. CKD?:   Creatinine 1.18 on 11/13, labs ordered for Monday 15.  Candidiasis with dysuria with incontinence: UA 50WBC , +yeast , neg bacteria, Diflucan through 11/14 16.  Cardiac arrythmia- has loop, PACs on EKG   LOS: 8 days A FACE TO FACE EVALUATION WAS PERFORMED  Ankit ALorie Phenix11/14/2020, 2:03 PM

## 2018-11-17 ENCOUNTER — Inpatient Hospital Stay (HOSPITAL_COMMUNITY): Payer: Medicare HMO

## 2018-11-17 ENCOUNTER — Inpatient Hospital Stay (HOSPITAL_COMMUNITY): Payer: Medicare HMO | Admitting: Physical Therapy

## 2018-11-17 LAB — GLUCOSE, CAPILLARY
Glucose-Capillary: 105 mg/dL — ABNORMAL HIGH (ref 70–99)
Glucose-Capillary: 151 mg/dL — ABNORMAL HIGH (ref 70–99)
Glucose-Capillary: 158 mg/dL — ABNORMAL HIGH (ref 70–99)
Glucose-Capillary: 70 mg/dL (ref 70–99)
Glucose-Capillary: 156 mg/dL — ABNORMAL HIGH (ref 70–99)

## 2018-11-17 NOTE — Progress Notes (Signed)
Physical Therapy Discharge Summary  Patient Details  Name: Denise [name] MRN: [id number] Date of Birth: 09-Jan-1945  Today's Date: 11/18/2018 PT Individual Time: 1015-1100 PT Individual Time Calculation (min): 45 min    Patient has met 9 of 9 long term goals due to improved activity tolerance, improved balance, increased strength, ability to compensate for deficits and improved awareness.  Patient to discharge at an ambulatory level Supervision.   Patient's care partner is independent to provide the necessary physical and cognitive assistance at discharge.  Reasons goals not met: Patient has met all rehab goals.  Recommendation:  Patient will benefit from ongoing skilled PT services in home health setting to continue to advance safe functional mobility, address ongoing impairments in activity tolerance, B LE strength, dynamic standing balance, stair training, and minimize fall risk.  Equipment: No equipment provided as pt has all necessary DME  Reasons for discharge: treatment goals met and discharge from hospital  Patient/family agrees with progress made and goals achieved: Yes   Skilled Intervention: Pt received seated EOB, agreeable to PT session. [name] Gwenlyn Perking present, has already completed hands-on family education and is safe to provide Supervision level assist overall to pt upon d/c home. Pt is at Supervision level for all transfers with no AD and is independent with bed mobility. Stand pivot transfer bed to w/c at Supervision level. Ascend/descend 4 x 6" stairs with 2 handrails at Supervision level. Car transfer to simulation car seat height of pt's vehicle at Supervision level with no AD. Ambulation x 50 ft with no AD and Supervision. Pt on 4L O2 throughout session, SpO2 drops to 89%, with pursed lip breathing techniques and seated rest break returns to 90% (+). Pt left seated in bed with needs in reach, bed alarm in place, husband present at end of session. Pt and her  husband with all questions answered and feel ready for a safe d/c home tomorrow.   PT Discharge Precautions/Restrictions Precautions Precautions: Fall;Other (comment) Precaution Comments: monitor SpO2 sats (on 4L of O2 via nasal cannula baseline) and BP Restrictions Weight Bearing Restrictions: No Pain Pain Assessment Pain Score: 0-No pain Vision/Perception  Perception Perception: Within Functional Limits Praxis Praxis: Intact  Cognition Overall Cognitive Status: History of cognitive impairments - at baseline Arousal/Alertness: Awake/alert Orientation Level: Oriented X4 Attention: Focused;Sustained Focused Attention: Appears intact Sustained Attention: Impaired Sustained Attention Impairment: Functional basic;Verbal basic Memory: Impaired Memory Impairment: Storage deficit;Retrieval deficit Awareness: Impaired Awareness Impairment: Emergent impairment;Intellectual impairment Problem Solving: Impaired Problem Solving Impairment: Functional basic;Verbal basic Safety/Judgment: Impaired Sensation Sensation Light Touch: Appears Intact Proprioception: Appears Intact Coordination Gross Motor Movements are Fluid and Coordinated: Yes Fine Motor Movements are Fluid and Coordinated: No Motor  Motor Motor: Hemiplegia;Abnormal postural alignment and control  Mobility Bed Mobility Bed Mobility: Rolling Right;Rolling Left;Supine to Sit;Sit to Supine Rolling Right: Independent Rolling Left: Independent Supine to Sit: Independent Sit to Supine: Independent Transfers Transfers: Sit to Stand;Stand Pivot Transfers;Stand to Sit Sit to Stand: Supervision/Verbal cueing Stand to Sit: Supervision/Verbal cueing Stand Pivot Transfers: Supervision/Verbal cueing Stand Pivot Transfer Details: Verbal cues for technique;Verbal cues for sequencing;Verbal cues for gait pattern;Verbal cues for precautions/safety;Tactile cues for sequencing;Tactile cues for placement;Tactile cues for  posture Transfer (Assistive device): None Locomotion  Gait Ambulation: Yes Gait Assistance: Supervision/Verbal cueing Gait Distance (Feet): 50 Feet Assistive device: None Gait Assistance Details: Verbal cues for precautions/safety Gait Gait: Yes Gait Pattern: Impaired Gait Pattern: Decreased step length - left;Decreased step length - right;Decreased stride length Gait velocity: decreased Stairs / Additional  Locomotion Stairs: Yes Stairs Assistance: Supervision/Verbal cueing Stair Management Technique: Two rails Number of Stairs: 4 Height of Stairs: 6 Wheelchair Mobility Wheelchair Mobility: No  Trunk/Postural Assessment  Cervical Assessment Cervical Assessment: Exceptions to WFL(forward head) Thoracic Assessment Thoracic Assessment: Exceptions to WFL(rounded shoulders) Lumbar Assessment Lumbar Assessment: Exceptions to WFL(posterior pelvic tilt) Postural Control Postural Control: Within Functional Limits  Balance Balance Balance Assessed: Yes Static Sitting Balance Static Sitting - Balance Support: No upper extremity supported;Feet supported Static Sitting - Level of Assistance: 6: Modified independent (Device/Increase time) Dynamic Sitting Balance Dynamic Sitting - Balance Support: No upper extremity supported;Feet supported;During functional activity Dynamic Sitting - Level of Assistance: 5: Stand by assistance Static Standing Balance Static Standing - Balance Support: No upper extremity supported;During functional activity Static Standing - Level of Assistance: 5: Stand by assistance Dynamic Standing Balance Dynamic Standing - Balance Support: No upper extremity supported;During functional activity Dynamic Standing - Level of Assistance: 5: Stand by assistance Extremity Assessment   RLE Assessment RLE Assessment: Within Functional Limits RLE Strength Right Hip Flexion: 4/5 Right Knee Flexion: 4/5 Right Knee Extension: 4/5 Right Ankle Dorsiflexion: 4/5 Right  Ankle Plantar Flexion: 4/5 LLE Assessment LLE Assessment: Within Functional Limits LLE Strength Left Hip Flexion: 4/5 Left Knee Flexion: 4/5 Left Knee Extension: 4/5 Left Ankle Dorsiflexion: 4/5 Left Ankle Plantar Flexion: 4/5    Excell Seltzer, PT, DPT 11/18/2018, 12:53 PM

## 2018-11-17 NOTE — Progress Notes (Signed)
CBG 70, po liquid and graham crackers provided, asymptomatic

## 2018-11-17 NOTE — Progress Notes (Signed)
Occupational Therapy Session Note  Patient Details  Name: Denise [name] MRN: [id number] Date of Birth: 1945-09-24  Today's Date: 11/17/2018 OT Individual Time: 0930-1020 OT Individual Time Calculation (min): 50 min    Short Term Goals: Week 2:  OT Short Term Goal 1 (Week 2): Continue working on currentl LTGs set at supervision level.  Skilled Therapeutic Interventions/Progress Updates:    Pt received sitting EOB with no c/o pain, on 4L [place]. Pt required increased time and cueing for task initiation. Pt completed functional mobility around room with (S), requiring cueing to be aware of O2 line  Limitations. Pt transferred to toilet with (S) and was incontinent of BM. Pt completed peri hygiene with (S) and cueing for thoroughness. Pt donned underwear with (S), min A to don pants 2/2 getting caught on gripper socks.  Pt declined UB bathing stating she would shower at home. Pt now SOB and vitals assessed. BP: 134/91. Unable to get a reliable SpO2 reading on any digit but pt encouraged to take a prolonged seated rest break until SOB improved. Pt completed 125 ftof functional mobility with (S), no AE on 4L O2.Pt required 1 standing rest break and upon return to room SpO2 was at 93%. Pt requested to return supine and was left with all needs met, fresh water and coffee.   Therapy Documentation Precautions:  Precautions Precautions: Fall, Other (comment) Precaution Comments: monitor SpO2 sats (on 4L of O2 via nasal cannula baseline) and BP (MD approved for therapy with BP <180/110) Restrictions Weight Bearing Restrictions: No  Therapy/Group: Individual Therapy  Curtis Sites 11/17/2018, 10:03 AM

## 2018-11-17 NOTE — Progress Notes (Signed)
[facility] PHYSICAL MEDICINE & REHABILITATION PROGRESS NOTE   Subjective/Complaints:  Patient seen sitting up at the edge of her bed this morning eating breakfast.  Good sitting balance noted.  She is looking forward to going home on Tuesday.  ROS: Denies CP, SOB, N/V/D  Objective:   No results found. No results for input(s): WBC, HGB, HCT, PLT in the last 72 hours. Recent Labs    11/15/18 0448  NA 144  K 4.8  CL 102  CO2 33*  GLUCOSE 118*  BUN 22  CREATININE 1.18*  CALCIUM 10.7*    Intake/Output Summary (Last 24 hours) at 11/17/2018 1323 Last data filed at 11/16/2018 1700 Gross per 24 hour  Intake 240 ml  Output -  Net 240 ml     Physical Exam: Vital Signs Blood pressure 138/87, pulse 69, temperature 97.6 F (36.4 C), temperature source Oral, resp. rate 19, height _0  (1.676 m), weight 47.5 kg, SpO2 95 %. Constitutional: No distress . Vital signs reviewed. HENT: Normocephalic.  Atraumatic. Eyes: EOMI. No discharge. Cardiovascular: No JVD. Respiratory: Normal effort.  No stridor.  + [place]. GI: Non-distended. Skin: Warm and dry.  Intact. Psych: Normal mood.  Normal behavior. Musc: No edema in extremities.  No tenderness in extremities. Neuro: Alert Motor: Grossly 4-4+/5 throughout (left slightly stronger than right)  Assessment/Plan: 1. Functional deficits secondary to Right cerebellar ICH and Left cerebellar infarct  which require 3+ hours per day of interdisciplinary therapy in a comprehensive inpatient rehab setting.  Physiatrist is providing close team supervision and 24 hour management of active medical problems listed below.  Physiatrist and rehab team continue to assess barriers to discharge/monitor patient progress toward functional and medical goals  Care Tool:  Bathing  Bathing activity did not occur: Safety/medical concerns(BP elevated and O2 low) Body parts bathed by patient: Buttocks, Front perineal area, Face     Body parts n/a: Chest,  Abdomen, Left lower leg, Right lower leg, Left upper leg, Right upper leg(Pt did not attempt this session)   Bathing assist Assist Level: Supervision/Verbal cueing     Upper Body Dressing/Undressing Upper body dressing   What is the patient wearing?: Pull over shirt    Upper body assist Assist Level: Supervision/Verbal cueing    Lower Body Dressing/Undressing Lower body dressing      What is the patient wearing?: Pants     Lower body assist Assist for lower body dressing: Contact Guard/Touching assist     Toileting Toileting    Toileting assist Assist for toileting: Contact Guard/Touching assist     Transfers Chair/bed transfer  Transfers assist     Chair/bed transfer assist level: Supervision/Verbal cueing     Locomotion Ambulation   Ambulation assist      Assist level: Contact Guard/Touching assist Assistive device: No Device Max distance: 10 ft   Walk 10 feet activity   Assist     Assist level: Contact Guard/Touching assist Assistive device: No Device   Walk 50 feet activity   Assist    Assist level: Contact Guard/Touching assist Assistive device: No Device    Walk 150 feet activity   Assist Walk 150 feet activity did not occur: Safety/medical concerns  Assist level: Supervision/Verbal cueing Assistive device: No Device    Walk 10 feet on uneven surface  activity   Assist     Assist level: Minimal Assistance - Patient > 75%(up/down ramp) Assistive device: Other (comment)(None)   Wheelchair     Assist Will patient use wheelchair at discharge?:  No Type of Wheelchair: Manual    Wheelchair assist level: Supervision/Verbal cueing Max wheelchair distance: 40    Wheelchair 50 feet with 2 turns activity    Assist        Assist Level: Supervision/Verbal cueing   Wheelchair 150 feet activity     Assist      Assist Level: Supervision/Verbal cueing   Blood pressure 138/87, pulse 69, temperature 97.6 F  (36.4 C), temperature source Oral, resp. rate 19, height _0  (1.676 m), weight 47.5 kg, SpO2 95 %.  Medical Problem List and Plan: 1.Impaired gait and mobilitysecondary to acute ischemic stroke with hemorrhagic transformation.  Continue CIR 2. Antithrombotics: -DVT/anticoagulation:Mechanical:Sequential compression devices, below kneeBilateral lower extremitiesBLE dopplers negative on 11/4 -antiplatelet therapy: ASA 325 mg daily 3.Headaches/Pain Management:Tylenol prn 4. Mood:LCSW to follow for evaluation and support.She has a history of anxiety and depression. -antipsychotic agents: N/A 5. Neuropsych: This patientiscapable of making decisions on herown behalf. 6. Skin/Wound Care:Monitor wound for healing. 7. Fluids/Electrolytes/Nutrition:Maintain adequate nutritional and hydration status.She has severe protein calorie nutrition and is currently on Mirtazepine 49m HS to stimulate appetite. 8. HTN: Monitor BP tid and avoid hypotension due to high grade L-VA stenosis.BP goals <160 due to cerebellar bleed. Currently on Lasix, aldactone and Imdur--Losartan on hold at this time. Vitals:   11/17/18 0444 11/17/18 0745  BP: 138/87   Pulse: 69   Resp: 19   Temp: 97.6 F (36.4 C)   SpO2: 100% 95%   Slightly labile on 11/15 9. T2DM: Hgb A1c- 7.0. Monitor BS ac/hs with SSIfor now.Was on glucotrol 2.5 mg daily prior to last admission. CBG (last 3)  Recent Labs    11/16/18 2103 11/17/18 0640 11/17/18 1140  GLUCAP 134* 105* 151*   Labile on 11/15 10. COPD:4 litersOxygen dependent.Respiratory status stable on Incruse and Breo daily.  11. Chronic systolic CHF: Monitor for signs of overload and check weight daily. Continue spironolactone, Imdur, Lipitor, ASA and lasix daily.  Filed Weights   11/15/18 0542 11/16/18 0500 11/17/18 0444  Weight: 45.8 kg 46.7 kg 47.5 kg   ?  Trending up on 1/13, consider medication adjustments if  persistently continues to trend up 12. Hypokalemia:   Kdur bid, decreased dose and frequency on 11/14  Potassium 4.8 on 11/13, labs ordered for tomorrow 13. ABLA:   Hemoglobin 11.5 on 11/9, labs ordered for tomorrow 14. CKD?:   Creatinine 1.18 on 11/13, labs ordered for tomorrow 15.  Candidiasis with dysuria with incontinence: UA 50WBC , +yeast , neg bacteria, Diflucan course completed on 11/14 16.  Cardiac arrythmia- has loop, PACs on EKG   LOS: 9 days A FACE TO FACE EVALUATION WAS PERFORMED  Antuan Limes ALorie Phenix11/15/2020, 1:23 PM

## 2018-11-17 NOTE — Progress Notes (Signed)
Physical Therapy Session Note  Patient Details  Name: Denise [name] MRN: [id number] Date of Birth: 10-Sep-1945  Today's Date: 11/17/2018 PT Individual Time: 1310-1426 PT Individual Time Calculation (min): 76 min   Short Term Goals: Week 2:  PT Short Term Goal 1 (Week 2): STG=LTG due to ELOS  Skilled Therapeutic Interventions/Progress Updates:    Pt received sitting EOB with her husband present and pt agreeable to therapy session. Pt received and maintained on 4L of O2 via nasal cannula throughout session. SpO2 99% at beginning of session. Sit<>stands with close supervision for safety throughout session. Ambulated ~49f, no AD, with CGA for steadying prior to pt requesting seated rest break. Therapist retrieved pt leg rests and w/c cushion to improve pt comfort/fit in w/c.  Transported to/from gym in w/c for energy conservation. Ambulated 832f no AD, with CGA for steadying. Provided seated rest break for breathing recovery due to increased breathing effort but unable to get an SpO2 reading on pulse oximeter. Stand pivot transfers, no AD, with CGA for steadying throughout session. Standing on airex balance challenge and dual-cognitive-task of creating mildly complex PEG board pattern with CGA for steadying and max step-by-step cuing and some seated rest breaks to create 3/4ths of the pattern with significantly increased time - pt demonstrating significant impairments in problem solving and visuo-spatial alignment during this task. Dynamic standing balance task of alternate B LE foot taps on 4" step with CGA for steadying - SpO2 92%. Progressed to alternate B LE stepping up/down on 4" step with min assist for balance - SpO2 83% requiring >1.71m37mtes to recover to 90%. Transported back to room in w/c. Ambulated ~27f55f to/from bathroom with CGA for steadying. Performed standing LB clothing management and sit<>stand transfer on/off toilet with CGA for steadying - continent of bladder and bowel and  performed peri-care without assist. Pt left sitting EOB with needs in reach, 4L O2 via nasal cannula intact, and her husband present.  Therapy Documentation Precautions:  Precautions Precautions: Fall, Other (comment) Precaution Comments: monitor SpO2 sats (on 4L of O2 via nasal cannula baseline) and BP (MD approved for therapy with BP <180/110) Restrictions Weight Bearing Restrictions: No  Pain:   Denies pain during session.   Therapy/Group: Individual Therapy  CarlTawana Scale, DPT 11/17/2018, 1:02 PM

## 2018-11-18 ENCOUNTER — Inpatient Hospital Stay (HOSPITAL_COMMUNITY): Payer: Medicare HMO | Admitting: Physical Therapy

## 2018-11-18 ENCOUNTER — Inpatient Hospital Stay (HOSPITAL_COMMUNITY): Payer: Medicare HMO

## 2018-11-18 ENCOUNTER — Inpatient Hospital Stay (HOSPITAL_COMMUNITY): Payer: Medicare HMO | Admitting: Occupational Therapy

## 2018-11-18 LAB — GLUCOSE, CAPILLARY
Glucose-Capillary: 124 mg/dL — ABNORMAL HIGH (ref 70–99)
Glucose-Capillary: 104 mg/dL — ABNORMAL HIGH (ref 70–99)
Glucose-Capillary: 220 mg/dL — ABNORMAL HIGH (ref 70–99)
Glucose-Capillary: 99 mg/dL (ref 70–99)

## 2018-11-18 LAB — CBC
HCT: 36.9 % (ref 36.0–46.0)
Hemoglobin: 11.5 g/dL — ABNORMAL LOW (ref 12.0–15.0)
MCH: 26.3 pg (ref 26.0–34.0)
MCHC: 31.2 g/dL (ref 30.0–36.0)
MCV: 84.2 fL (ref 80.0–100.0)
Platelets: 178 10*3/uL (ref 150–400)
RBC: 4.38 MIL/uL (ref 3.87–5.11)
RDW: 15.5 % (ref 11.5–15.5)
WBC: 4.7 10*3/uL (ref 4.0–10.5)
nRBC: 0 % (ref 0.0–0.2)

## 2018-11-18 LAB — BASIC METABOLIC PANEL
Anion gap: 11 (ref 5–15)
BUN: 25 mg/dL — ABNORMAL HIGH (ref 8–23)
CO2: 30 mmol/L (ref 22–32)
Calcium: 10.6 mg/dL — ABNORMAL HIGH (ref 8.9–10.3)
Chloride: 99 mmol/L (ref 98–111)
Creatinine, Ser: 1.37 mg/dL — ABNORMAL HIGH (ref 0.44–1.00)
GFR calc Af Amer: 44 mL/min — ABNORMAL LOW (ref >60)
GFR calc non Af Amer: 38 mL/min — ABNORMAL LOW (ref >60)
Glucose, Bld: 132 mg/dL — ABNORMAL HIGH (ref 70–99)
Potassium: 4.9 mmol/L (ref 3.5–5.1)
Sodium: 140 mmol/L (ref 135–145)

## 2018-11-18 NOTE — Progress Notes (Signed)
Occupational Therapy Discharge Summary  Patient Details  Name: Denise [name] MRN: [id number] Date of Birth: 04/05/1945  Today's Date: 11/18/2018 OT Individual Time: 1258-1402 OT Individual Time Calculation (min): 64 min    Patient has met 10 of 10 long term goals due to improved activity tolerance, improved balance, functional use of  LEFT upper and LEFT lower extremity, improved attention and improved coordination.  Patient to discharge at overall Supervision level.  Patient's care partner is independent to provide the necessary physical and cognitive assistance at discharge.    Reasons goals not met: na  Recommendation:  Patient will benefit from ongoing skilled OT services in home health setting to continue to advance functional skills in the area of BADL, iADL and Reduce care partner burden.  Equipment: tub transfer bench  Reasons for discharge: treatment goals met  Patient/family agrees with progress made and goals achieved: Yes  OT Discharge Precautions/Restrictions  Precautions Precautions: Fall Precaution Comments: monitor SpO2 sats (on 4L of O2 via nasal cannula baseline) and BP Restrictions Weight Bearing Restrictions: No General  patient alert and ready for therapy.  4L O2 via [place].  Husband present for OT session this afternoon.  Patient agreed to complete shower with husband's encouragement - she is able to ambulate in room and complete transfers to/from bed, toilet, shower bench and w/c with CS, cues for O2 tubing management.  Bathing and dressing completed at CS level with increased time due to occ SOB.  Discharge status as documented below.   Reviewed energy conservation techniques, supervision and cuing needs for safety with ADL and daily routine, home set up, DME and discharge plan.  Patient with some recall impairment, husband demonstrates a good understanding of patient needs post discharge.  Patient and husband state that they are prepared for discharge to  home tomorrow.  Vital Signs   Pain Pain Assessment Pain Scale: 0-10 Pain Score: 0-No pain ADL ADL Eating: Moderate assistance Grooming: Not assessed Upper Body Bathing: Not assessed Lower Body Bathing: Not assessed Upper Body Dressing: Supervision/safety Where Assessed-Upper Body Dressing: Edge of bed Lower Body Dressing: Minimal assistance Where Assessed-Lower Body Dressing: Edge of bed Toileting: Minimal assistance, Maximal cueing Where Assessed-Toileting: Toilet Toilet Transfer: Minimal assistance Toilet Transfer Method: Ambulating Toilet Transfer Equipment: Grab bars ADL Comments: CS for bathing shower level, CS toileting, CS dressing, CS grooming - encourage sitting for tasks due to SOB Vision   Perception  Perception: Within Functional Limits Praxis Praxis: Intact Cognition Overall Cognitive Status: History of cognitive impairments - at baseline Arousal/Alertness: Awake/alert Orientation Level: Oriented to person;Oriented to place;Oriented to situation Attention: Focused;Sustained Focused Attention: Appears intact Sustained Attention: Impaired Sustained Attention Impairment: Functional basic;Verbal basic Memory: Impaired Memory Impairment: Storage deficit;Retrieval deficit Awareness: Impaired Awareness Impairment: Emergent impairment;Intellectual impairment Problem Solving: Impaired Problem Solving Impairment: Functional basic;Verbal basic Safety/Judgment: Impaired Sensation Sensation Light Touch: Appears Intact Proprioception: Appears Intact Coordination Gross Motor Movements are Fluid and Coordinated: Yes Fine Motor Movements are Fluid and Coordinated: No Motor  Motor Motor: Hemiplegia;Abnormal postural alignment and control Mobility  Bed Mobility Bed Mobility: Rolling Right;Rolling Left;Supine to Sit;Sit to Supine Rolling Right: Independent Rolling Left: Independent Supine to Sit: Independent Sit to Supine: Independent Transfers Sit to Stand:  Supervision/Verbal cueing Stand to Sit: Supervision/Verbal cueing  Trunk/Postural Assessment  Cervical Assessment Cervical Assessment: Exceptions to WFL(forward head) Thoracic Assessment Thoracic Assessment: Exceptions to WFL(rounded shoulders) Lumbar Assessment Lumbar Assessment: Exceptions to WFL(posterior pelvic tilt) Postural Control Postural Control: Within Functional Limits  Balance Balance Balance Assessed: Yes   Static Sitting Balance Static Sitting - Balance Support: No upper extremity supported;Feet supported Static Sitting - Level of Assistance: 6: Modified independent (Device/Increase time) Dynamic Sitting Balance Dynamic Sitting - Balance Support: No upper extremity supported;Feet supported;During functional activity Dynamic Sitting - Level of Assistance: 5: Stand by assistance Static Standing Balance Static Standing - Balance Support: No upper extremity supported;During functional activity Static Standing - Level of Assistance: 5: Stand by assistance Dynamic Standing Balance Dynamic Standing - Balance Support: No upper extremity supported;During functional activity Dynamic Standing - Level of Assistance: 5: Stand by assistance Extremity/Trunk Assessment RUE Assessment RUE Assessment: Within Functional Limits LUE Assessment General Strength Comments: generalized weakness and decreased coordination, shoulder flex/abd 100, distal WFL, able to use left hand funcitonally for washing, dressing tasks    A  11/18/2018, 2:31 PM  

## 2018-11-18 NOTE — Progress Notes (Signed)
Social Work Discharge Note   The overall goal for the admission was met for:   Discharge location: Yes-HOME WITH HUSBAND WHO CAN PROVIDE 24 HR SUPERVISION  Length of Stay: Yes-10 DAYS  Discharge activity level: Yes-SUPERVISION LEVEL  Home/community participation: Yes  Services provided included: MD, RD, PT, OT, SLP, RN, CM, Pharmacy and SW  Financial Services: Private Insurance: [place]  Follow-up services arranged: Home Health: [facility], DME: [facility] and Patient/Family has no preference for HH/DME agencies  Comments (or additional information):HUSABAND WAS HERE AND ATTENDED THERAPIES WITH PT AND CAN PROVIDE 24 HR SUPERVISION WAS HELPING HER PRIOR TO ADMISSION.  Patient/Family verbalized understanding of follow-up arrangements: Yes  Individual responsible for coordination of the follow-up plan: [name]-HUSBAND  Confirmed correct DME delivered: Elease Hashimoto 11/18/2018    Elease Hashimoto

## 2018-11-18 NOTE — Progress Notes (Signed)
[place] PHYSICAL MEDICINE & REHABILITATION PROGRESS NOTE   Subjective/Complaints:    ROS: Denies CP, SOB, N/V/D  Objective:   No results found. Recent Labs    11/18/18 0533  WBC 4.7  HGB 11.5*  HCT 36.9  PLT 178   Recent Labs    11/18/18 0533  NA 140  K 4.9  CL 99  CO2 30  GLUCOSE 132*  BUN 25*  CREATININE 1.37*  CALCIUM 10.6*    Intake/Output Summary (Last 24 hours) at 11/18/2018 0959 Last data filed at 11/17/2018 1957 Gross per 24 hour  Intake 480 ml  Output -  Net 480 ml     Physical Exam: Vital Signs Blood pressure 114/70, pulse 77, temperature 99 F (37.2 C), temperature source Oral, resp. rate 16, height _0  (1.676 m), weight 47.1 kg, SpO2 95 %. Constitutional: No distress . Vital signs reviewed. HENT: Normocephalic.  Atraumatic. Eyes: EOMI. No discharge. Cardiovascular: No JVD. Respiratory: Normal effort.  No stridor.  + [place]. GI: Non-distended. Skin: Warm and dry.  Intact. Psych: Normal mood.  Normal behavior. Musc: No edema in extremities.  No tenderness in extremities. Neuro: Alert Motor: Grossly 4-4+/5 throughout (left slightly stronger than right)  Assessment/Plan: 1. Functional deficits secondary to Right cerebellar ICH and Left cerebellar infarct  which require 3+ hours per day of interdisciplinary therapy in a comprehensive inpatient rehab setting.  Physiatrist is providing close team supervision and 24 hour management of active medical problems listed below.  Physiatrist and rehab team continue to assess barriers to discharge/monitor patient progress toward functional and medical goals  Care Tool:  Bathing  Bathing activity did not occur: Safety/medical concerns(BP elevated and O2 low) Body parts bathed by patient: Buttocks, Front perineal area, Face     Body parts n/a: Chest, Abdomen, Left lower leg, Right lower leg, Left upper leg, Right upper leg(Pt did not attempt this session)   Bathing assist Assist Level:  Supervision/Verbal cueing     Upper Body Dressing/Undressing Upper body dressing   What is the patient wearing?: Pull over shirt    Upper body assist Assist Level: Supervision/Verbal cueing    Lower Body Dressing/Undressing Lower body dressing      What is the patient wearing?: Pants     Lower body assist Assist for lower body dressing: Contact Guard/Touching assist     Toileting Toileting    Toileting assist Assist for toileting: Contact Guard/Touching assist     Transfers Chair/bed transfer  Transfers assist     Chair/bed transfer assist level: Contact Guard/Touching assist     Locomotion Ambulation   Ambulation assist      Assist level: Contact Guard/Touching assist Assistive device: No Device Max distance: 65f   Walk 10 feet activity   Assist     Assist level: Contact Guard/Touching assist Assistive device: No Device   Walk 50 feet activity   Assist    Assist level: Contact Guard/Touching assist Assistive device: No Device    Walk 150 feet activity   Assist Walk 150 feet activity did not occur: Safety/medical concerns  Assist level: Supervision/Verbal cueing Assistive device: No Device    Walk 10 feet on uneven surface  activity   Assist     Assist level: Minimal Assistance - Patient > 75%(up/down ramp) Assistive device: Other (comment)(None)   Wheelchair     Assist Will patient use wheelchair at discharge?: No Type of Wheelchair: Manual    Wheelchair assist level: Supervision/Verbal cueing Max wheelchair distance: 40  Wheelchair 50 feet with 2 turns activity    Assist        Assist Level: Supervision/Verbal cueing   Wheelchair 150 feet activity     Assist      Assist Level: Supervision/Verbal cueing   Blood pressure 114/70, pulse 77, temperature 99 F (37.2 C), temperature source Oral, resp. rate 16, height _0  (1.676 m), weight 47.1 kg, SpO2 95 %.  Medical Problem List and  Plan: 1.Impaired gait and mobilitysecondary to acute ischemic stroke with hemorrhagic transformation.  Continue CIR 2. Antithrombotics: -DVT/anticoagulation:Mechanical:Sequential compression devices, below kneeBilateral lower extremitiesBLE dopplers negative on 11/4 -antiplatelet therapy: ASA 325 mg daily 3.Headaches/Pain Management:Tylenol prn 4. Mood:LCSW to follow for evaluation and support.She has a history of anxiety and depression. -antipsychotic agents: N/A 5. Neuropsych: This patientiscapable of making decisions on herown behalf. 6. Skin/Wound Care:Monitor wound for healing. 7. Fluids/Electrolytes/Nutrition:Maintain adequate nutritional and hydration status.She has severe protein calorie nutrition and is currently on Mirtazepine 67m HS to stimulate appetite. 8. HTN: Monitor BP tid and avoid hypotension due to high grade L-VA stenosis.BP goals <160 due to cerebellar bleed. Currently on Lasix, aldactone and Imdur--Losartan on hold at this time. Vitals:   11/18/18 0727 11/18/18 0729  BP:    Pulse:    Resp:    Temp:    SpO2: 95% 95%   Slightly labile on 11/15 9. T2DM: Hgb A1c- 7.0. Monitor BS ac/hs with SSIfor now.Was on glucotrol 2.5 mg daily prior to last admission. CBG (last 3)  Recent Labs    11/17/18 2123 11/17/18 2238 11/18/18 0649  GLUCAP 70 156* 124*   Labile on 11/15 10. COPD:4 litersOxygen dependent.Respiratory status stable on Incruse and Breo daily.  11. Chronic systolic CHF: Monitor for signs of overload and check weight daily. Continue spironolactone, Imdur, Lipitor, ASA and lasix daily.  Filed Weights   11/16/18 0500 11/17/18 0444 11/18/18 0449  Weight: 46.7 kg 47.5 kg 47.1 kg   ?  Trending up on 1/13, consider medication adjustments if persistently continues to trend up 12. Hypokalemia:   Kdur bid, decreased dose and frequency on 11/14  Potassium 4.8 on 11/13, labs ordered for tomorrow 13. ABLA:    Hemoglobin 11.5 on 11/9, stable on 11/16 14. CKD?:   Creatinine 1.18 on 11/13up to 1.37 today - poor intake an don home dose diuretic, will reduce Lasix to 257muntil f/u with cardiology this month  15.  Candidiasis with dysuria with incontinence: UA 50WBC , +yeast , neg bacteria, Diflucan course completed on 11/14 16.  Cardiac arrythmia- has loop, PACs on EKG   LOS: 10 days A FACE TO FACE EVALUATION WAS PERFORMED  AnCharlett Blake1/16/2020, 9:59 AM

## 2018-11-18 NOTE — Plan of Care (Signed)
  Problem: RH SKIN INTEGRITY Goal: RH STG SKIN FREE OF INFECTION/BREAKDOWN Description: Patient will remain free of infection/skin breakdown while in rehab. Outcome: Progressing Goal: RH STG MAINTAIN SKIN INTEGRITY WITH ASSISTANCE Description: STG Maintain Skin Integrity With Mod I Assistance. Outcome: Progressing Goal: RH STG ABLE TO PERFORM INCISION/WOUND CARE W/ASSISTANCE Description: STG Able To Perform Incision/Wound Care With [employer]. Outcome: Progressing   Problem: RH SAFETY Goal: RH STG ADHERE TO SAFETY PRECAUTIONS W/ASSISTANCE/DEVICE Description: STG Adhere to Safety Precautions With [place] with appropriate assistive Device. Outcome: Progressing Goal: RH STG DECREASED RISK OF FALL WITH ASSISTANCE Description: STG Decreased Risk of Fall With [employer]. Outcome: Progressing   Problem: RH COGNITION-NURSING Goal: RH STG USES MEMORY AIDS/STRATEGIES W/ASSIST TO PROBLEM SOLVE Description: STG Uses Memory Aids/Strategies With Min Assistance to Problem Solve. Outcome: Progressing Goal: RH STG ANTICIPATES NEEDS/CALLS FOR ASSIST W/ASSIST/CUES Description: STG Anticipates Needs/Calls for Assist With Min Assistance/Cues. Outcome: Progressing   Problem: RH PAIN MANAGEMENT Goal: RH STG PAIN MANAGED AT OR BELOW [name] PAIN GOAL Description: Patient will maintain pain goal of <3. Outcome: Progressing   Problem: RH KNOWLEDGE DEFICIT Goal: RH STG INCREASE KNOWLEDGE OF HYPERTENSION Description: Patient/Family will demonstrate knowledge of HTN medications and prevention strategies with Mod I assistance. Outcome: Progressing Goal: RH STG INCREASE KNOWLEDGE OF DYSPHAGIA/FLUID INTAKE Description: Patient/family will demonstrate appropriate knowledge of fluid intake and swallowing precautions with Mod I assistance.  Outcome: Progressing Goal: RH STG INCREASE KNOWLEGDE OF HYPERLIPIDEMIA Description: Patient/Family will demonstrate appropriate knowledge of hyperlipidemia  medications and prevention strategies with Mod I assistance. Outcome: Progressing Goal: RH STG INCREASE KNOWLEDGE OF STROKE PROPHYLAXIS Description: Patient/Family will demonstrate appropriate knowledge of preventative measures to decrease likelihood of stroke related to diet, exercise, medication management with Mod I assistance Outcome: Progressing   Problem: Consults Goal: RH STROKE PATIENT EDUCATION Description: See Patient Education module for education specifics  Outcome: Progressing

## 2018-11-18 NOTE — Progress Notes (Signed)
Speech Language Pathology Discharge Summary  Patient Details  Name: Denise [name]. Roland MRN: [id number] Date of Birth: Oct 20, 1945  Today's Date: 11/18/2018 SLP Individual Time: 1135-1205 SLP Individual Time Calculation (min): 30 min   Skilled Therapeutic Interventions:  Skilled ST services focused on education and cognitive skills. SLP facilitated reassessment of [name], pt scored 10/30, with original score on evalution 13/30 (n=>26.) Although pt's score decreased from evaluation, pt demonstrated important increase of awareness of deficits and emergent error awareness and word finding in naming pictures. Pt noted to have increase difficulty with auditory comprehension and problem solving compared to original assessment, however per chart review has demonstrated functional problem solving and ability to follow basic commands. SLP provided education of improvements along with continued deficits and strategies to assist with recall, problem solving, auditory comprehension and word finding. All questions were answered to satisfaction from pt and pt's husband. Pt was left in room with husband, call bell within reach and bed alarm set. ST recommends to continue skilled ST services.     Patient has met 6 of 6 long term goals.  Patient to discharge at overall Supervision;Min level.  Reasons goals not met:     Clinical Impression/Discharge Summary:   Pt made meeting 6 out 6 goals, discharging at supervision to min A in familiar and functional tasks only, not reflective of skill set in novel tasks. Pt participated in functional problem solving tasks, following basic commands, strategies of recall with aid, intellectual/safety awareness, sustained attention and confrontation word finding in pictures. Pt demonstrated increase in all of the above listed areas, primarily in functional and familiar tasks, however when given tasks to address these skill sets in a unfamiliar manner, pt demonstrated reduce  carryover of skills. The primary change noted during short length of stay was increase in awareness of deficits and emergent error awareness. Pt and pt's husband were educated on current deficits and strategies to compensate. All questions were answered to satisfaction. Pt would continue to benefit from skilled ST services in order to maximize functional independence and reduce burden of care, requiring 24 hour supervision and continue ST services. Care Partner:  Caregiver Able to Provide Assistance: Yes  Type of Caregiver Assistance: Physical;Cognitive  Recommendation:  Home Health SLP;24 hour supervision/assistance  Rationale for SLP Follow Up: Maximize functional communication;Maximize cognitive function and independence;Reduce caregiver burden   Equipment: N/A   Reasons for discharge: Discharged from hospital   Patient/Family Agrees with Progress Made and Goals Achieved: Yes    Alexis Mizuno  [facility] 11/18/2018, 12:39 PM

## 2018-11-19 ENCOUNTER — Inpatient Hospital Stay: Payer: Medicare HMO | Admitting: Internal Medicine

## 2018-11-19 LAB — GLUCOSE, CAPILLARY
Glucose-Capillary: 129 mg/dL — ABNORMAL HIGH (ref 70–99)
Glucose-Capillary: 228 mg/dL — ABNORMAL HIGH (ref 70–99)

## 2018-11-19 MED ORDER — FLUTICASONE FUROATE-VILANTEROL 100-25 MCG/INH IN AEPB: 1.0000 | INHALATION_SPRAY | Freq: Every day | RESPIRATORY_TRACT | Status: DC

## 2018-11-19 MED ORDER — UMECLIDINIUM BROMIDE 62.5 MCG/INH IN AEPB: 1.0000 | INHALATION_SPRAY | Freq: Every day | RESPIRATORY_TRACT | Status: DC

## 2018-11-19 MED ORDER — POTASSIUM CHLORIDE CRYS ER 10 MEQ PO TBCR: 10.0000 meq | EXTENDED_RELEASE_TABLET | Freq: Every day | ORAL | 0 refills | Status: DC

## 2018-11-19 MED ORDER — ACETAMINOPHEN 325 MG PO TABS: 325.0000 mg | ORAL_TABLET | ORAL | Status: DC | PRN

## 2018-11-19 MED ORDER — ATORVASTATIN CALCIUM 20 MG PO TABS: 20.0000 mg | ORAL_TABLET | Freq: Every day | ORAL | 0 refills | Status: DC

## 2018-11-19 MED ORDER — FUROSEMIDE 40 MG PO TABS: 40.0000 mg | ORAL_TABLET | Freq: Every day | ORAL | 0 refills | Status: DC

## 2018-11-19 MED ORDER — SPIRONOLACTONE 25 MG PO TABS: 25.0000 mg | ORAL_TABLET | Freq: Every day | ORAL | 0 refills | Status: DC

## 2018-11-19 MED ORDER — METOPROLOL TARTRATE 25 MG PO TABS: 12.5000 mg | ORAL_TABLET | Freq: Two times a day (BID) | ORAL | 0 refills | Status: DC

## 2018-11-19 NOTE — Discharge Instructions (Signed)
Inpatient Rehab Discharge Instructions  Denise [name] Discharge date and time: 11/19/18   Activities/Precautions/ Functional Status: Activity: no lifting, driving, or strenuous exercise for till cleared by MD Diet: cardiac diet and diabetic diet Wound Care: keep wound clean and dry    Functional status:  ___ No restrictions     ___ Walk up steps independently _X__ 24/7 supervision/assistance   ___ Walk up steps with assistance ___ Intermittent supervision/assistance  ___ Bathe/dress independently ___ Walk with walker     _X__ Bathe/dress with assistance ___ Walk Independently    ___ Shower independently ___ Walk with assistance    ___ Shower with assistance _X__ No alcohol     ___ Return to work/school ________   Special Instructions: 1. Monitor blood sugars twice a day before meals and/or at bedtime. Record and take results with you to primary care.  2. Incruse and Breo take place of Trielegy.   COMMUNITY REFERRALS UPON DISCHARGE:    Home Health:   PT, RN, OT, SP     Agency:[facility]   [phone]   Date of last service:11/19/2018  Medical Equipment/Items Ordered:TUB BENCH  Agency/Supplier:[facility]   [phone]    STROKE/TIA DISCHARGE INSTRUCTIONS SMOKING Cigarette smoking nearly doubles your risk of having a stroke & is the single most alterable risk factor  If you smoke or have smoked in the last 12 months, you are advised to quit smoking for your health.  Most of the excess cardiovascular risk related to smoking disappears within a year of stopping.  Ask you doctor about anti-smoking medications  [place] Quit Line: 1-800-QUIT NOW  Free Smoking Cessation Classes [phone]  CHOLESTEROL Know your levels; limit fat & cholesterol in your diet  Lipid Panel     Component Value Date/Time   CHOL 170 11/05/2018 0602   TRIG 85 11/05/2018 0602   HDL 62 11/05/2018 0602   CHOLHDL 2.7 11/05/2018 0602   VLDL 17 11/05/2018 0602   LDLCALC 91  11/05/2018 0602      Many patients benefit from treatment even if their cholesterol is at goal.  Goal: Total Cholesterol (CHOL) less than 160  Goal:  Triglycerides (TRIG) less than 150  Goal:  HDL greater than 40  Goal:  LDL (LDLCALC) less than 100   BLOOD PRESSURE American Stroke Association blood pressure target is less that 120/80 mm/Hg  Your discharge blood pressure is:  BP: (!) 145/97  Monitor your blood pressure  Limit your salt and alcohol intake  Many individuals will require more than one medication for high blood pressure  DIABETES (A1c is a blood sugar average for last 3 months) Goal HGBA1c is under 7% (HBGA1c is blood sugar average for last 3 months)  Diabetes:     Lab Results  Component Value Date   HGBA1C 7.0 (H) 11/05/2018     Your HGBA1c can be lowered with medications, healthy diet, and exercise.  Check your blood sugar as directed by your physician  Call your physician if you experience unexplained or low blood sugars.  PHYSICAL ACTIVITY/REHABILITATION Goal is 30 minutes at least 4 days per week  Activity: No restrictions. Therapies: see above Return to work: N/A  Activity decreases your risk of heart attack and stroke and makes your heart stronger.  It helps control your weight and blood pressure; helps you relax and can improve your mood.  Participate in a regular exercise program.  Talk with your doctor about the best form of exercise for you (dancing, walking, swimming,  cycling).  DIET/WEIGHT Goal is to maintain a healthy weight  Your discharge diet is:  Diet Order            Diet heart healthy/carb modified Room service appropriate? Yes; Fluid consistency: Thin  Diet effective now             liquids Your height is:  Height: 5' 6" (167.6 cm) Your current weight is: Weight: 41.8 kg Your Body Mass Index (BMI) is:  BMI (Calculated): 14.88  Following the type of diet specifically designed for you will help prevent another stroke.  You are  below weight range.   Your goal Body Mass Index (BMI) is 19-24.  Healthy food habits can help reduce 3 risk factors for stroke:  High cholesterol, hypertension, and excess weight.  RESOURCES Stroke/Support Group:  Call [phone]   STROKE EDUCATION PROVIDED/REVIEWED AND GIVEN TO PATIENT Stroke warning signs and symptoms How to activate emergency medical system (call 911). Medications prescribed at discharge. Need for follow-up after discharge. Personal risk factors for stroke. Pneumonia vaccine given:  Flu vaccine given:  My questions have been answered, the writing is legible, and I understand these instructions.  I will adhere to these goals & educational materials that have been provided to me after my discharge from the hospital.    My questions have been answered and I understand these instructions. I will adhere to these goals and the provided educational materials after my discharge from the hospital.  Patient/Caregiver Signature _______________________________ Date __________  Clinician Signature _______________________________________ Date __________  Please bring this form and your medication list with you to all your follow-up doctor's appointments.

## 2018-11-19 NOTE — Progress Notes (Signed)
Patient anticipates discharge home today

## 2018-11-19 NOTE — Progress Notes (Addendum)
[place] PHYSICAL MEDICINE & REHABILITATION PROGRESS NOTE   Subjective/Complaints:   No issues overnite  ROS: Denies CP, SOB, N/V/D  Objective:   No results found. Recent Labs    11/18/18 0533  WBC 4.7  HGB 11.5*  HCT 36.9  PLT 178   Recent Labs    11/18/18 0533  NA 140  K 4.9  CL 99  CO2 30  GLUCOSE 132*  BUN 25*  CREATININE 1.37*  CALCIUM 10.6*    Intake/Output Summary (Last 24 hours) at 11/19/2018 0824 Last data filed at 11/19/2018 0805 Gross per 24 hour  Intake 680 ml  Output -  Net 680 ml     Physical Exam: Vital Signs Blood pressure (!) 145/97, pulse 78, temperature 98.7 F (37.1 C), temperature source Oral, resp. rate 18, height _0  (1.676 m), weight 41.8 kg, SpO2 94 %. Constitutional: No distress . Vital signs reviewed. HENT: Normocephalic.  Atraumatic. Eyes: EOMI. No discharge. Cardiovascular: No JVD. Respiratory: Normal effort.  No stridor.  + [place]. GI: Non-distended. Skin: Warm and dry.  Intact. Psych: Normal mood.  Normal behavior. Musc: No edema in extremities.  No tenderness in extremities. Neuro: Alert Motor: Grossly 4-4+/5 throughout (left slightly stronger than right)  Assessment/Plan: 1. Functional deficits secondary to Right cerebellar ICH and Left cerebellar infarct  Stable for D/C today F/u PCP in 3-4 weeks F/u PM&R 2 weeks See D/C summary See D/C instructions Care Tool:  Bathing  Bathing activity did not occur: Safety/medical concerns(BP elevated and O2 low) Body parts bathed by patient: Right arm, Left arm, Chest, Abdomen, Front perineal area, Buttocks, Right upper leg, Left upper leg, Right lower leg, Left lower leg, Face     Body parts n/a: Chest, Abdomen, Left lower leg, Right lower leg, Left upper leg, Right upper leg(Pt did not attempt this session)   Bathing assist Assist Level: Supervision/Verbal cueing     Upper Body Dressing/Undressing Upper body dressing   What is the patient wearing?: Pull over shirt     Upper body assist Assist Level: Supervision/Verbal cueing    Lower Body Dressing/Undressing Lower body dressing      What is the patient wearing?: Underwear/pull up, Pants     Lower body assist Assist for lower body dressing: Supervision/Verbal cueing     Toileting Toileting    Toileting assist Assist for toileting: Supervision/Verbal cueing     Transfers Chair/bed transfer  Transfers assist     Chair/bed transfer assist level: Supervision/Verbal cueing     Locomotion Ambulation   Ambulation assist      Assist level: Supervision/Verbal cueing Assistive device: No Device Max distance: 43f   Walk 10 feet activity   Assist     Assist level: Supervision/Verbal cueing Assistive device: Walker-rolling   Walk 50 feet activity   Assist    Assist level: Supervision/Verbal cueing Assistive device: Walker-rolling    Walk 150 feet activity   Assist Walk 150 feet activity did not occur: Safety/medical concerns  Assist level: Supervision/Verbal cueing Assistive device: No Device    Walk 10 feet on uneven surface  activity   Assist     Assist level: Minimal Assistance - Patient > 75% Assistive device: Other (comment)(None)   Wheelchair     Assist Will patient use wheelchair at discharge?: No Type of Wheelchair: Manual    Wheelchair assist level: Supervision/Verbal cueing Max wheelchair distance: 40    Wheelchair 50 feet with 2 turns activity    Assist  Assist Level: Supervision/Verbal cueing   Wheelchair 150 feet activity     Assist      Assist Level: Supervision/Verbal cueing   Blood pressure (!) 145/97, pulse 78, temperature 98.7 F (37.1 C), temperature source Oral, resp. rate 18, height _0  (1.676 m), weight 41.8 kg, SpO2 94 %.  Medical Problem List and Plan: 1.Impaired gait and mobilitysecondary to acute ischemic stroke with hemorrhagic transformation.  D/C home today HHPT/OT f/u  2.  Antithrombotics: -DVT/anticoagulation:Mechanical:Sequential compression devices, below kneeBilateral lower extremitiesBLE dopplers negative on 11/4 -antiplatelet therapy: ASA 325 mg daily 3.Headaches/Pain Management:Tylenol prn 4. Mood:LCSW to follow for evaluation and support.She has a history of anxiety and depression. -antipsychotic agents: N/A 5. Neuropsych: This patientiscapable of making decisions on herown behalf. 6. Skin/Wound Care:Monitor wound for healing. 7. Fluids/Electrolytes/Nutrition:Maintain adequate nutritional and hydration status.She has severe protein calorie nutrition and is currently on Mirtazepine 50m HS to stimulate appetite. 8. HTN: Monitor BP tid and avoid hypotension due to high grade L-VA stenosis.BP goals <160 due to cerebellar bleed. Currently on Lasix, aldactone and Imdur--Losartan on hold at this time. Vitals:   11/19/18 0608 11/19/18 0758  BP: (!) 145/97   Pulse: 78   Resp: 18   Temp:    SpO2: 97% 94%   Mild elevation still in systolic parameter  9. T2DM: Hgb A1c- 7.0. Monitor BS ac/hs with SSIfor now.Was on glucotrol 2.5 mg daily prior to last admission. CBG (last 3)  Recent Labs    11/18/18 1625 11/18/18 2122 11/19/18 0604  GLUCAP 220* 99 129*   Labile on 11/17  10. COPD:4 litersOxygen dependent.Respiratory status stable on Incruse and Breo daily.  11. Chronic systolic CHF: Monitor for signs of overload and check weight daily. Continue spironolactone, Imdur, Lipitor, ASA and lasix daily.  Filed Weights   11/17/18 0444 11/18/18 0449 11/19/18 0603  Weight: 47.5 kg 47.1 kg 41.8 kg   ?  Trending up on 1/13, consider medication adjustments if persistently continues to trend up 12. Hypokalemia:   Kdur bid, decreased dose and frequency on 11/14  Potassium 4.8 on 11/13, labs ordered for tomorrow 13. ABLA:   Hemoglobin 11.5 on 11/9, stable on 11/16 14. CKD?:   Creatinine 1.18 on 11/13up to 1.37  today - poor intake an don home dose diuretic, will reduce Lasix to 275muntil f/u with cardiology this month  15.  Candidiasis with dysuria with incontinence: UA 50WBC , +yeast , neg bacteria, Diflucan course completed on 11/14 16.  Cardiac arrythmia- has loop, PACs on EKG   LOS: 11 days A FACE TO FACE EVALUATION WAS PERFORMED  AnCharlett Blake1/17/2020, 8:24 AM

## 2018-11-19 NOTE — Progress Notes (Signed)
Pt discharged to home after discharge instructions were given by Reesa Chew PA. Pt transferred to private vehicle and transferred to home O2.

## 2018-11-19 NOTE — Progress Notes (Signed)
Address with patient safety issues after several times patient attempting to get out of bed without the assistance of staff,reiterated to patient current safety measures and guidelines to prevent her from falling states she understood, Bed alarm placed on medium, call n=bell placed within reach, SR x3 , Monitor and assisted

## 2018-11-19 NOTE — Discharge Summary (Signed)
Physician Discharge Summary  Patient ID: [name] MRN: [id number] DOB/AGE: 73/04/1945 73 y.o.  Admit date: 11/08/2018 Discharge date: 11/19/2018  Discharge Diagnoses:  Principal Problem:   Cerebellar stroke, acute ([place]) Active Problems:   COPD GOLD  II     Essential hypertension   Vaginal candidiasis   Acute blood loss anemia   Supplemental oxygen dependent   Uncontrolled type 2 diabetes mellitus with hyperglycemia [facility])   Discharged Condition: stable   Significant Diagnostic Studies: N/A   Labs:  Basic Metabolic Panel: BMP Latest Ref Rng & Units 11/18/2018 11/15/2018 11/11/2018  Glucose 70 - 99 mg/dL 132(H) 118(H) 117(H)  BUN 8 - 23 mg/dL 25(H) 22 15  Creatinine 0.44 - 1.00 mg/dL 1.37(H) 1.18(H) 1.19(H)  BUN/Creat Ratio 6 - 22 (calc) - - -  Sodium 135 - 145 mmol/L 140 144 143  Potassium 3.5 - 5.1 mmol/L 4.9 4.8 4.6  Chloride 98 - 111 mmol/L 99 102 103  CO2 22 - 32 mmol/L 30 33(H) 31  Calcium 8.9 - 10.3 mg/dL 10.6(H) 10.7(H) 10.6(H)    CBC: CBC Latest Ref Rng & Units 11/18/2018 11/11/2018 11/09/2018  WBC 4.0 - 10.5 K/uL 4.7 5.1 5.1  Hemoglobin 12.0 - 15.0 g/dL 11.5(L) 11.5(L) 11.4(L)  Hematocrit 36.0 - 46.0 % 36.9 36.6 35.9(L)  Platelets 150 - 400 K/uL 178 158 193    CBG: Recent Labs  Lab 11/18/18 1129 11/18/18 1625 11/18/18 2122 11/19/18 0604 11/19/18 1131  GLUCAP 104* 220* 99 129* 228*    Brief HPI:   Denise [name] is a 73 y.o. female with history of T2DM, HTN, COPD with chronic hypoxic respiratory failure, colon cancer who was admitted on 11/04/2018 with sudden onset of left-sided weakness and slurred speech.  CT head negative for bleed and she was treated with TPA.  She developed severe headache headache with elevated blood pressure later that evening and CT of head revealed right cerebral infarct with hemorrhagic transformation.  She received cryoprecipitate and TXA and elevated blood pressure treated with Cleviprex.  MRI/MRI brain done  revealing a large right cerebellar hemorrhagic infarct with mass-effect on posterior fossa and multiple additional small acute/subacute infarcts in right MCA territory with chronic hemorrhagic/nonhemorrhagic cortical infarcts bilateral cerebral hemispheres.  Carotid Dopplers were negative for significant stenosis.  2D echo showed EF of 60 to 65% with severe LVH.  BLE Dopplers were negative for DVT.  Follow-up CT of head showed stable bleed and she was started on ASA 325 mg daily for secondary stroke prevention.  Dr. Erlinda Hong felt the stroke was due to high-grade stenosis left VA with relative low blood pressure but likely embolic.  Loop recorder placed by Dr. Curt Bears on 11/6.  Patient with resultant left-sided weakness and balance deficits affecting functional status.  CIR was recommended for follow-up therapy   Hospital Course: Denise [name] was admitted to rehab 11/08/2018 for inpatient therapies to consist of PT, ST and OT at least three hours five days a week. Past admission physiatrist, therapy team and rehab RN have worked together to provide customized collaborative inpatient rehab. Blood pressures were monitored on TID basis and have been reasonably controlled within set parameters. Her diabetes has been monitored with ac/hs CBG checks and SSI was use prn for tighter BS control.  Her p.o. intake has been poor and nutritional supplements were offered during his stay.  Husband has also been feeding patient and encouraging intake during his stay.  She is to monitor blood sugars on twice daily basis and follow-up  with PCP for input on resuming to control.  Lytes at admission showed hypokalemia which was supplemented with increase doses of keto briefly.  Weights have been monitored daily and have been variable.  Follow-up check of lytes showed worsening of renal status and she was encouraged to increase fluid intake. Respiratory status is stable on current regimen of Incruse and Breo --husband advised to  continue to use this at discharge as she had not filled Rx for Trielegy yet.   She did have issues with dysuria and incontinence.  UA showed Candida and she was treated with 7-day course of Diflucan. She is continent of bowel and bladder. She has made gains during rehab stay and is currently at supervision level. She will continue to receive follow up [place], [place], [place] and [place] by [facility]  after discharge  Rehab course: During patient's stay in rehab weekly team conferences were held to monitor patient's progress, set goals and discuss barriers to discharge. At admission, patient required min assist with mobility and with basic self-care tasks.  She exhibited severe to moderate cognitive linguistic impairments affecting immediate recall, basic auditory comprehension, problem solving as well as awareness and attention.  She was able to follow 1 and two-step commands.She  has had improvement in activity tolerance, balance, postural control as well as ability to compensate for deficits. She is able to complete ADL tasks with occasional shortness of breath and increased time.  Her MoCA scoree at discharge is lower from 13/30 to 10/30 but she is showing improvement in awareness of deficits as well as awareness of word finding deficits.  She does have reduced carryover and has been educated on memory strategies.  She currently requires supervision to min assist for cognitive tasks family education was completed with patient and husband regarding conservation techniques, cueing for safety as well as need for supervision.   Disposition: Home  Diet: Heart healthy/diabetic  Special Instructions: 1.  Follow-up with cardiology for input on diuretic. 2.  Monitor blood sugars 2-3 times a day and follow-up with PCP for input on resuming Glucotrol.   Discharge Instructions    Ambulatory referral to Physical Medicine Rehab   Complete by: As directed    1-2 weeks transitional care appt     Allergies as of  11/19/2018   No Known Allergies     Medication List    STOP taking these medications   bisoprolol 5 MG tablet Commonly known as: ZEBETA   Chlorhexidine Gluconate Cloth 2 % Pads   insulin aspart 100 UNIT/ML injection Commonly known as: novoLOG   senna-docusate 8.6-50 MG tablet Commonly known as: Senokot-S   sodium chloride 0.9 % infusion   Trelegy Ellipta 100-62.5-25 MCG/INH Aepb Generic drug: Fluticasone-Umeclidin-Vilant     TAKE these medications   Accu-Chek Aviva Plus w/Device Kit Use as instructed to test blood sugar up to 3 times daily What changed:   how much to take  how to take this  when to take this  additional instructions   Accu-Chek Aviva Soln USE AS INSTRUCTED TO TEST BLOOD SUGAR DAILY What changed:   how much to take  how to take this  when to take this  additional instructions   accu-chek soft touch lancets Use as instructed to test blood sugar up to 3 times daily What changed:   how much to take  how to take this  when to take this  additional instructions   acetaminophen 325 MG tablet Commonly known as: TYLENOL Take 1-2 tablets (  325-650 mg total) by mouth every 4 (four) hours as needed for mild pain.   albuterol 108 (90 Base) MCG/ACT inhaler Commonly known as: VENTOLIN HFA INHALE 1-2 PUFFS INTO THE LUNGS EVERY 4 HOURS AS NEEDED FOR WHEEZING OR SHORTNESS OF BREATH. What changed:   how much to take  how to take this  when to take this  reasons to take this   aspirin 325 MG EC tablet Take 1 tablet (325 mg total) by mouth daily.   atorvastatin 20 MG tablet Commonly known as: LIPITOR Take 1 tablet (20 mg total) by mouth at bedtime.   famotidine 20 MG tablet Commonly known as: PEPCID Take 1 tablet (20 mg total) by mouth daily.   fluticasone furoate-vilanterol 100-25 MCG/INH Aepb Commonly known as: BREO ELLIPTA Inhale 1 puff into the lungs daily.   furosemide 40 MG tablet Commonly known as: Lasix Take 1 tablet  (40 mg total) by mouth daily.   glucose blood test strip Commonly known as: Accu-Chek Aviva Plus Use as instructed to test blood sugar up to 3 times daily   metoprolol tartrate 25 MG tablet Commonly known as: LOPRESSOR Take 0.5 tablets (12.5 mg total) by mouth 2 (two) times daily.   mirtazapine 15 MG tablet Commonly known as: REMERON TAKE 1 TABLET BY MOUTH AT BEDTIME. FOR DEPRESSION AND APPETITE. What changed:   how much to take  how to take this  when to take this  additional instructions   mouth rinse Liqd solution 15 mLs by Mouth Rinse route 2 (two) times daily.   OXYGEN Inhale 4 L into the lungs continuous.   potassium chloride 10 MEQ tablet Commonly known as: Klor-Con M10 Take 1 tablet (10 mEq total) by mouth daily. What changed: how much to take   spironolactone 25 MG tablet Commonly known as: ALDACTONE Take 1 tablet (25 mg total) by mouth daily. Take at night What changed:   when to take this  additional instructions   umeclidinium bromide 62.5 MCG/INH Aepb Commonly known as: INCRUSE ELLIPTA Inhale 1 puff into the lungs daily.      Follow-up Information    Kirsteins, Luanna Salk, MD Follow up.   Specialty: Physical Medicine and Rehabilitation Why: office will call you with follow up appointment Contact information: [place] [address] [phone]        [facility]. Call on 11/20/2018.   Why: for stroke follow up Contact information: [address]     [place] [address] [phone]       [name], NP. Call on 11/20/2018.   Specialty: Internal Medicine Why: for post hospital follow up Contact information: [address] [place] [address] [phone]        Tanda Rockers, MD Follow up on 11/25/2018.   Specialty: Pulmonary Disease Why: Keep follow up appointment Contact information: [place]  [address] [phone]           Signed: Bary Leriche 11/20/2018, 6:04 PM

## 2018-11-20 ENCOUNTER — Telehealth: Payer: Self-pay

## 2018-11-20 ENCOUNTER — Other Ambulatory Visit (HOSPITAL_COMMUNITY): Payer: Self-pay

## 2018-11-20 ENCOUNTER — Telehealth (HOSPITAL_COMMUNITY): Payer: Self-pay | Admitting: Physical Medicine and Rehabilitation

## 2018-11-20 NOTE — Telephone Encounter (Signed)
Left message for patient to call back to complete TCM call. Appointment already made with PCP for 11/22/2018

## 2018-11-20 NOTE — Telephone Encounter (Signed)
Contacted husband to update him on patient's most recent labs showing evidence of AKI. Advised him to encourage intake of water and to change lasix to 1/2 pill daily.

## 2018-11-20 NOTE — Progress Notes (Signed)
Paramedicine Encounter    Patient ID: Denise [name], female    DOB: 1945-04-24, 73 y.o.   MRN: [id number]    Patient Care Team: [name], NP as PCP - General (Internal Medicine) [name], MD as Consulting Physician (Cardiology)  Patient Active Problem List   Diagnosis Date Noted  . Vaginal candidiasis   . Acute blood loss anemia   . Hypokalemia   . Supplemental oxygen dependent   . Uncontrolled type 2 diabetes mellitus with hyperglycemia ([place])   . Labile blood glucose   . ICH (intracerebral hemorrhage) ([place]) s/p tPA administration 11/08/2018  . Hypertensive urgency 11/08/2018  . Cerebellar stroke, acute ([place]) 11/08/2018  . Acute ischemic stroke ([place]) 11/04/2018  . COPD exacerbation ([place]) 10/28/2018  . Acute on chronic systolic CHF (congestive heart failure) ([place]) 10/28/2018  . Elevated troponin 10/28/2018  . Lactic acidosis 10/28/2018  . GERD (gastroesophageal reflux disease) 10/28/2018  . Acute on chronic respiratory failure with hypercapnia ([place]) 10/28/2018  . Diabetes mellitus without complication-diet controled 10/28/2018  . On home oxygen therapy 07/12/2018  . CHF (congestive heart failure) ([place]) 05/16/2018  . Chronic respiratory failure with hypoxia and hypercapnia ([place]) 05/03/2018  . Borderline abnormal TFTs 05/02/2018  . Type 2 diabetes mellitus ([place]) 04/26/2018  . Unintended weight loss 04/26/2018  . Lower extremity edema 02/20/2018  . Chronic respiratory failure with hypoxia ([place]) 10/02/2017  . Hematuria 09/27/2017  . Pulmonary hypertension ([name]) c/w cor pulmonale   . Protein-calorie malnutrition, severe 09/17/2017  . Constipation   . Hyperparathyroidism, primary ([place]) 07/27/2016  . Loss of appetite 06/02/2016  . COPD GOLD  II   11/22/2015  . Vitamin D deficiency 11/22/2015  . Anxiety and depression 11/22/2015  . Insomnia 11/22/2015  . Hyperlipidemia 11/22/2015  . Essential hypertension 11/22/2015  . Hx of colonic polyps 02/10/2013  .  History of colon cancer 06/11/1986    Current Outpatient Medications:  .  aspirin EC 325 MG EC tablet, Take 1 tablet (325 mg total) by mouth daily., Disp: 30 tablet, Rfl: 0 .  atorvastatin (LIPITOR) 20 MG tablet, Take 1 tablet (20 mg total) by mouth at bedtime., Disp: 30 tablet, Rfl: 0 .  famotidine (PEPCID) 20 MG tablet, Take 1 tablet (20 mg total) by mouth daily., Disp:  , Rfl:  .  furosemide (LASIX) 40 MG tablet, Take 1 tablet (40 mg total) by mouth daily., Disp: 30 tablet, Rfl: 0 .  mirtazapine (REMERON) 15 MG tablet, TAKE 1 TABLET BY MOUTH AT BEDTIME. FOR DEPRESSION AND APPETITE. (Patient taking differently: Take 15 mg by mouth at bedtime. ), Disp: 90 tablet, Rfl: 3 .  OXYGEN, Inhale 4 L into the lungs continuous. , Disp: , Rfl:  .  potassium chloride (KLOR-CON M10) 10 MEQ tablet, Take 1 tablet (10 mEq total) by mouth daily., Disp: 30 tablet, Rfl: 0 .  spironolactone (ALDACTONE) 25 MG tablet, Take 1 tablet (25 mg total) by mouth daily. Take at night, Disp: 30 tablet, Rfl: 0 .  acetaminophen (TYLENOL) 325 MG tablet, Take 1-2 tablets (325-650 mg total) by mouth every 4 (four) hours as needed for mild pain., Disp:  , Rfl:  .  albuterol (VENTOLIN HFA) 108 (90 Base) MCG/ACT inhaler, INHALE 1-2 PUFFS INTO THE LUNGS EVERY 4 HOURS AS NEEDED FOR WHEEZING OR SHORTNESS OF BREATH. (Patient taking differently: Inhale 1-2 puffs into the lungs every 4 (four) hours as needed for wheezing or shortness of breath. INHALE 1-2 PUFFS INTO THE LUNGS EVERY 4 HOURS AS NEEDED  FOR WHEEZING OR SHORTNESS OF BREATH.), Disp: 6.7 g, Rfl: 5 .  Blood Glucose Calibration (ACCU-CHEK AVIVA) SOLN, USE AS INSTRUCTED TO TEST BLOOD SUGAR DAILY (Patient taking differently: 1 each by Other route daily. ), Disp: 1 each, Rfl: 2 .  Blood Glucose Monitoring Suppl (ACCU-CHEK AVIVA PLUS) w/Device KIT, Use as instructed to test blood sugar up to 3 times daily (Patient taking differently: 1 each by Other route 3 (three) times daily. ), Disp: 1  kit, Rfl: 0 .  fluticasone furoate-vilanterol (BREO ELLIPTA) 100-25 MCG/INH AEPB, Inhale 1 puff into the lungs daily., Disp:  , Rfl:  .  glucose blood (ACCU-CHEK AVIVA PLUS) test strip, Use as instructed to test blood sugar up to 3 times daily, Disp: 300 each, Rfl: 1 .  Lancets (ACCU-CHEK SOFT TOUCH) lancets, Use as instructed to test blood sugar up to 3 times daily (Patient taking differently: 1 each by Other route 3 (three) times daily. ), Disp: 300 each, Rfl: 1 .  metoprolol tartrate (LOPRESSOR) 25 MG tablet, Take 0.5 tablets (12.5 mg total) by mouth 2 (two) times daily., Disp: 30 tablet, Rfl: 0 .  mouth rinse LIQD solution, 15 mLs by Mouth Rinse route 2 (two) times daily., Disp:  , Rfl: 0 .  umeclidinium bromide (INCRUSE ELLIPTA) 62.5 MCG/INH AEPB, Inhale 1 puff into the lungs daily., Disp:  , Rfl:  No Known Allergies   Social History   Socioeconomic History  . Marital status: Married    Spouse name: Not on file  . Number of children: 3  . Years of education: Not on file  . Highest education level: Not on file  Occupational History  . Not on file  Social Needs  . Financial resource strain: Not on file  . Food insecurity    Worry: Not on file    Inability: Not on file  . Transportation needs    Medical: Not on file    Non-medical: Not on file  Tobacco Use  . Smoking status: Former Smoker    Packs/day: 1.00    Years: 30.00    Pack years: 30.00    Types: Cigarettes    Quit date: 1989    Years since quitting: 31.9  . Smokeless tobacco: Never Used  Substance and Sexual Activity  . Alcohol use: No  . Drug use: No  . Sexual activity: Not Currently    Partners: Male    Birth control/protection: Post-menopausal, Surgical  Lifestyle  . Physical activity    Days per week: Not on file    Minutes per session: Not on file  . Stress: Not on file  Relationships  . Social Herbalist on phone: Not on file    Gets together: Not on file    Attends religious service: Not  on file    Active member of club or organization: Not on file    Attends meetings of clubs or organizations: Not on file    Relationship status: Not on file  . Intimate partner violence    Fear of current or ex partner: Not on file    Emotionally abused: Not on file    Physically abused: Not on file    Forced sexual activity: Not on file  Other Topics Concern  . Not on file  Social History Narrative   Married. 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  Abdominal:     General: There is no distension.  Musculoskeletal:        General: No swelling.     Right lower leg: No edema.     Left lower leg: No edema.  Skin:    General: Skin is warm and dry.         Future Appointments  Date Time Provider [place]  11/22/2018 11:00 AM [name], NP [facility]  11/25/2018 11:15 AM Tanda Rockers, MD [facility] None  12/02/2018 11:20 AM Ranell Patrick Clide Deutscher, MD [facility] CPR  12/19/2018 11:40 AM Larey Dresser, MD [facility] None  12/23/2018 11:00 AM [name], NP [facility] P[facility]  12/31/2018 10:15 AM Darden Dates, Janett Billow, NP [facility] None     Pulse 89   Temp 98.9 F (37.2 C)   Wt 99 lb (44.9 kg)   SpO2 98%   BMI 15.98 kg/m   ATF pt CAO x4. She came home yesterday from the hospital. Her husband said that she's had several medication changes.  Denise [name] said that she feels better but still a little weak.  The sob that she's having is per her norm. Her husband picked up the prescription for trelegy earlier today.  rx bottles verified and pill box refilled.    Medication ordered: none  Marchia Diguglielmo, EMT Paramedic [phone] 11/20/2018    ACTION: Home visit completed

## 2018-11-21 ENCOUNTER — Telehealth: Payer: Self-pay | Admitting: Registered Nurse

## 2018-11-21 ENCOUNTER — Ambulatory Visit: Payer: Medicare HMO

## 2018-11-21 ENCOUNTER — Telehealth: Payer: Self-pay | Admitting: Internal Medicine

## 2018-11-21 ENCOUNTER — Other Ambulatory Visit: Payer: Self-pay

## 2018-11-21 NOTE — Patient Outreach (Signed)
[facility]) Care Management  11/21/2018  Denise [name] 1945-08-21 [id number]   EMMI- General Discharge RED ON EMMI ALERT Day # 1 Date:  11/20/2018 Red Alert Reason:  Questions about discharge papers? Yes  Unfilled prescriptions? Yes  Other questions/problems? Yes    Outreach attempt: Telephone call to patient. Spoke with patient and spouse Denise [name]. He states patient is adjusting to being home. He is her caregiver.  Discussed red alerts.  He states that he has no question or concerns about discharge papers or questions in general.  He states that patient has one medication that he needs to pick today as it is ready now.  Otherwise patient has all her medications. He states that home health is now made contact and working with patient. He states that patient has follow up appointments and she has transportation.   He declines any needs at this time and declines further nurse follow up at this time.      Plan: RN CM will close case.  Jone Baseman, RN, MSN [facility] Care Management Care Management Coordinator Direct Line [phone] Toll Free: [phone]  Fax: [phone]

## 2018-11-21 NOTE — Telephone Encounter (Signed)
Placed a call to Denise [name], no answer. Left message to return the call.

## 2018-11-21 NOTE — Telephone Encounter (Signed)
Call returned to patient, confirmed DOB, she states she is feeling a lot better now that she is home. Husband Chalmers Cater (dpr) states they had a missed call and was calling back. I made him aware I do not see a telephone encounter or any recent labs, imaging, and/or tests. I did remind him of her upcoming appt 11/23. Made aware to disregard missed call for now and if someone needed something they would try them again. Voiced understanding. Nothing further needed at this time.

## 2018-11-21 NOTE — Telephone Encounter (Signed)
Transition Care Management Follow-up Telephone Call   Date discharged? 11/19/2018   How have you been since you were released from the hospital? " I am doing pretty good!" Spoke with Husband " She is coming along well!" " She is walking more, not as SOB, she is taking her time, being active" "Still has some weakness in the left side- hand is not strong"   Do you understand why you were in the hospital? yes   Do you understand the discharge instructions? yes   Where were you discharged to? Home with Husband   Items Reviewed:  Medications reviewed: yes  Allergies reviewed: yes  Dietary changes reviewed: yes  Referrals reviewed: yes, Appts with [facility], Pulmonary and PT coming up.   Functional Questionnaire:   Activities of Daily Living (ADLs):   She states they are independent in the following: ambulation, bathing and hygiene, feeding, continence, grooming, toileting, dressing and "She can do most things by herself but I help her, stay close by" States they require assistance with the following: bathing and hygiene, grooming and dressing "I stay close by why to help her with these tasks."   Any transportation issues/concerns?: no   Any patient concerns? no   Confirmed importance and date/time of follow-up visits scheduled yes Provider Appointment booked with [name], NP 11/22/2018  Confirmed with patient if condition begins to worsen call PCP or go to the ER.  Patient was given the office number and encouraged to call back with question or concerns.  : yes

## 2018-11-21 NOTE — Telephone Encounter (Signed)
Noted.

## 2018-11-22 ENCOUNTER — Other Ambulatory Visit: Payer: Self-pay

## 2018-11-22 ENCOUNTER — Ambulatory Visit (INDEPENDENT_AMBULATORY_CARE_PROVIDER_SITE_OTHER): Payer: Medicare HMO | Admitting: Primary Care

## 2018-11-22 ENCOUNTER — Encounter: Payer: Self-pay | Admitting: Primary Care

## 2018-11-22 DIAGNOSIS — Z8673 Personal history of transient ischemic attack (TIA), and cerebral infarction without residual deficits: Secondary | ICD-10-CM | POA: Diagnosis not present

## 2018-11-22 DIAGNOSIS — I5023 Acute on chronic systolic (congestive) heart failure: Secondary | ICD-10-CM | POA: Diagnosis not present

## 2018-11-22 DIAGNOSIS — E119 Type 2 diabetes mellitus without complications: Secondary | ICD-10-CM

## 2018-11-22 DIAGNOSIS — I10 Essential (primary) hypertension: Secondary | ICD-10-CM

## 2018-11-22 NOTE — Progress Notes (Signed)
Subjective:    Patient ID: Denise [name], female    DOB: August 21, 1945, 73 y.o.   MRN: [id number]  HPI  Denise [name] is a 73 year old female with a history of pulmonary hypertension, CHF, Cerebellar Stroke, COPD, hyperparathyroidism, type 2 diabetes, anxiety and depression, chronic respiratory failure who presents today for [facility] Follow up.  She presented to [facility] on 10/28/18 with a chief complaint of increased exertional dyspnea. Upon arrival to ED her effort of breathing and increased. Labs showed BNP of 1582, elevated troponin, lactic acid of 2.2. Chest xray with vascular congestion and emphysema. She was admitted to stepdown unit for further evaluation and treatment.  During her hospital stay she was initiated on Bipap, treated with IV Lasix with transition to PO, nasal cannula for oxygen saturation goal of 93% or above, Solu-Medrol, IV antibiotics. Elevated troponin levels determined to be secondary to demand ischemia. Complications of respiratory distress during stay, CT angio chest negative for PE. Cardiac echocardiogram concerning for cardiac amyloid so further labs were collected. Initiated on amiodarone drip for atrial tachycardia. Cardiology also did not suspect cardiac amyloidosis, rather hypertrophic cardiomyopathy. Discharged home on 11/02/18.  She presented back to the ED on 11/04/18 with reports of left sided arm and facial weakness, slurred speech. Paramedics confirmed deficits, Code Stroke initiated. CT head revealed acute hemorrhage to right cerebellar hemisphere with risk for hematoma expansion and herniation. Treated with cryoprecipitate with tranexamic acid, admitted to ICU for right cerebellar infarct s/p TPA with hemorrhagic transformation. Repeat CT on 11/07/18 with new small to moderate L cerebellar infarct. Echo with EF of 60-65%, carotid dopplres with 1-39% stenosis. Loop recorder placed per electrophysiology. Admitted to inpatient rehab on 11/08/18 for further  treatment.  During rehabilitation she was treated with PT/OT/ST due to residual left sided weakness and balance deficits. Rehab admission unremarkable. She was discharged home on 11/19/18 with Denise [name], Denise [name], Denise [name], and Denise [name] nursing. It was recommended she follow up with cardiology and PCP.   Since discharge home she's doing better. Breathing is better, she's compliant to her 4 liters of oxygen and oxygen saturation is running 90-94% at home. Home health has called and they will come by this weekend.   Her family is checking her weights daily and denies weight gain of >2 pounds. Home BP's are running 120's/80's-90's. She denies new weakness, numbness/tingling, facial drooping. Mostly experiencing upper extremity residual weakness which is overall mild.   She has an appointment with her pulmonology next week, physical medicine on 12/02/18, and with her cardiologist in mid December 2020.  BP Readings from Last 3 Encounters:  11/22/18 140/86  11/19/18 (!) 145/97  11/08/18 120/69     Review of Systems  Constitutional: Negative for fever.  Respiratory: Negative for cough.        Denies increased dyspnea  Cardiovascular: Negative for chest pain.  Neurological: Positive for weakness. Negative for dizziness, numbness and headaches.       Past Medical History:  Diagnosis Date  . Altered mental status   . Anxiety and depression   . Chronic respiratory failure with hypoxia ([place]) 10/02/2017   Assoc with ? Cor pulmonale dx 09/2017 so rx 02 1-2 lpm 24/7  - 10/17/2017  Walked RA x 3 laps @ 185 ft each stopped due to  End of study, nl pace,  desat to 87 on 3rd lap - 12/19/2017  Saturations on Room Air at Rest =91 % and while Ambulating = 87%  But  on  2 Liters  of pulsed oxygen while Ambulating =93% so rec POC 2lpm walking / use at rest if sats under 90%      . Colon cancer ([place]) 1988   Resected  . COPD (chronic obstructive pulmonary disease) ([place])   . Diabetes mellitus without complication ([name])    diet  controlled- no meds. per pt  . Diarrhea 04/02/2018  . Diverticulosis   . Hyperlipidemia   . Hypertension   . Hypoxia 10/30/2018  . Insomnia   . Primary hyperparathyroidism ([place])   . SBO (small bowel obstruction) ([place])   . Stroke ([place]) 10/2018   tpa administered  . Tubular adenoma of rectum 02/23/2013   low grade  . Vitamin D deficiency      Social History   Socioeconomic History  . Marital status: Married    Spouse name: Not on file  . Number of children: 3  . Years of education: Not on file  . Highest education level: Not on file  Occupational History  . Not on file  Social Needs  . Financial resource strain: Not on file  . Food insecurity    Worry: Not on file    Inability: Not on file  . Transportation needs    Medical: Not on file    Non-medical: Not on file  Tobacco Use  . Smoking status: Former Smoker    Packs/day: 1.00    Years: 30.00    Pack years: 30.00    Types: Cigarettes    Quit date: 1989    Years since quitting: 31.9  . Smokeless tobacco: Never Used  Substance and Sexual Activity  . Alcohol use: No  . Drug use: No  . Sexual activity: Not Currently    Partners: Male    Birth control/protection: Post-menopausal, Surgical  Lifestyle  . Physical activity    Days per week: Not on file    Minutes per session: Not on file  . Stress: Not on file  Relationships  . Social Herbalist on phone: Not on file    Gets together: Not on file    Attends religious service: Not on file    Active member of club or organization: Not on file    Attends meetings of clubs or organizations: Not on file    Relationship status: Not on file  . Intimate partner violence    Fear of current or ex partner: Not on file    Emotionally abused: Not on file    Physically abused: Not on file    Forced sexual activity: Not on file  Other Topics Concern  . Not on file  Social History Narrative   Married. 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 ABDOMINAL HYSTERECTOMY  08/2015  . BREAST BIOPSY Left   . BREAST EXCISIONAL BIOPSY Right   . COLON RESECTION  1980's  . COLONOSCOPY    . ENDOMETRIAL BIOPSY  2009   negative  . INCONTINENCE SURGERY  2017  . LOOP RECORDER INSERTION N/A 11/08/2018   Procedure: LOOP RECORDER INSERTION;  Surgeon: Constance Haw, MD;  Location: [facility];  Service: Cardiovascular;  Laterality: N/A;  . RIGHT HEART CATH N/A 09/20/2017   Procedure: RIGHT HEART CATH;  Surgeon: Larey Dresser, MD;  Location: [facility];  Service: Cardiovascular;  Laterality: N/A;  . RIGHT/LEFT HEART CATH AND CORONARY ANGIOGRAPHY N/A 03/19/2018   Procedure: RIGHT/LEFT HEART CATH AND CORONARY ANGIOGRAPHY;  Surgeon: Larey Dresser, MD;  Location: [facility];  Service: Cardiovascular;  Laterality: N/A;    Family History  Problem Relation Age of Onset  . Ovarian cancer Mother   . Breast cancer Neg Hx   . Hyperparathyroidism Neg Hx   . Colon cancer Neg Hx   . Esophageal cancer Neg Hx   . Stomach cancer Neg Hx     No Known Allergies  Current Outpatient Medications on File Prior to Visit  Medication Sig Dispense Refill  . acetaminophen (TYLENOL) 325 MG tablet Take 1-2 tablets (325-650 mg total) by mouth every 4 (four) hours as needed for mild pain.    [name] albuterol (VENTOLIN HFA) 108 (90 Base) MCG/ACT inhaler INHALE 1-2 PUFFS INTO THE LUNGS EVERY 4 HOURS AS NEEDED FOR WHEEZING OR SHORTNESS OF BREATH. (Patient taking differently: Inhale 1-2 puffs into the lungs every 4 (four) hours as needed for wheezing or shortness of breath. INHALE 1-2 PUFFS INTO THE LUNGS EVERY 4 HOURS AS NEEDED FOR WHEEZING OR SHORTNESS OF BREATH.) 6.7 g 5  . aspirin EC 325 MG EC tablet Take 1 tablet (325 mg total) by mouth daily. 30 tablet 0  . atorvastatin (LIPITOR) 20 MG tablet Take 1 tablet (20 mg  total) by mouth at bedtime. 30 tablet 0  . Blood Glucose Calibration (ACCU-CHEK AVIVA) SOLN USE AS INSTRUCTED TO TEST BLOOD SUGAR DAILY (Patient taking differently: 1 each by Other route daily. ) 1 each 2  . Blood Glucose Monitoring Suppl (ACCU-CHEK AVIVA PLUS) w/Device KIT Use as instructed to test blood sugar up to 3 times daily (Patient taking differently: 1 each by Other route 3 (three) times daily. ) 1 kit 0  . famotidine (PEPCID) 20 MG tablet Take 1 tablet (20 mg total) by mouth daily.    . fluticasone furoate-vilanterol (BREO ELLIPTA) 100-25 MCG/INH AEPB Inhale 1 puff into the lungs daily.    . furosemide (LASIX) 40 MG tablet Take 1 tablet (40 mg total) by mouth daily. 30 tablet 0  . glipiZIDE (GLUCOTROL) 5 MG tablet Take 2.5 mg by mouth daily before breakfast.    . glucose blood (ACCU-CHEK AVIVA PLUS) test strip Use as instructed to test blood sugar up to 3 times daily 300 each 1  . Lancets (ACCU-CHEK SOFT TOUCH) lancets Use as instructed to test blood sugar up to 3 times daily (Patient taking differently: 1 each by Other route 3 (three) times daily. ) 300 each 1  . metoprolol tartrate (LOPRESSOR) 25 MG tablet Take 0.5 tablets (12.5 mg total) by mouth 2 (two) times daily. 30 tablet 0  . mirtazapine (REMERON) 15 MG tablet TAKE 1 TABLET BY MOUTH AT BEDTIME. FOR DEPRESSION AND APPETITE. (Patient taking differently: Take 15 mg by mouth at bedtime. ) 90 tablet 3  . mouth rinse LIQD solution 15 mLs by Mouth Rinse route 2 (two) times daily.  0  . OXYGEN Inhale 4 L into the lungs continuous.     . potassium chloride (KLOR-CON M10) 10 MEQ tablet Take 1 tablet (10 mEq total) by mouth daily. 30 tablet 0  . spironolactone (ALDACTONE) 25 MG tablet Take 1 tablet (25 mg total) by mouth daily. Take at night 30 tablet 0  . umeclidinium bromide (INCRUSE ELLIPTA) 62.5 MCG/INH AEPB Inhale 1 puff into the lungs daily.     No current facility-administered medications on file prior  to visit.     BP 140/86    Pulse 60   Temp (!) 97.4 F (36.3 C) (Temporal)   Ht 5' 6" (1.676 m)   Wt 95 lb 4 oz (43.2 kg)   SpO2 100%   BMI 15.37 kg/m    Objective:   Physical Exam  Constitutional: She is oriented to person, place, and time.  Neck: Neck supple.  Cardiovascular: Normal rate and regular rhythm.  Respiratory: Effort normal and breath sounds normal.  Neurological: She is alert and oriented to person, place, and time.  3-4/5 strength to left upper extremity compared to right.   Skin: Skin is warm and dry.  Psychiatric: She has a normal mood and affect.           Assessment & Plan:

## 2018-11-22 NOTE — Patient Instructions (Signed)
Resume the glipizide medication, take 1/2 tablet once daily for diabetes. Monitor blood sugars and notify me if you see readings at or above 150 on a consistent basis.  Follow up with your lung doctor next week.  Continue to monitor your weights as discussed. Call for a weight gain of >2 pounds in 24 hours, 5 pounds in one week.   It was a pleasure to see you today!

## 2018-11-22 NOTE — Assessment & Plan Note (Signed)
Borderline but is labile with readings. Continue current regimen. No changes made today.

## 2018-11-22 NOTE — Assessment & Plan Note (Addendum)
Admission in late October 2020, improved with diuresis and treatment. Hospital notes, labs, imaging reviewed.  Weighing daily without suspicious weight gain. Compliant to her medication regimen. Appears euvolemic today. Following with HF clinic.

## 2018-11-22 NOTE — Assessment & Plan Note (Signed)
Glipizide held during hospital stay, resumed today as she was treated with insulin during her stay. A1C of 7.0 from November 2020.

## 2018-11-22 NOTE — Assessment & Plan Note (Signed)
Recent hospital admission which required inpatient rehabilitation.   Now doing much better, has been contacted by home health and will be seen over the weekend.  Neuro exam today stable. No new symptoms. Hospital notes, labs, imaging reviewed.

## 2018-11-25 ENCOUNTER — Encounter: Payer: Self-pay | Admitting: Internal Medicine

## 2018-11-25 ENCOUNTER — Other Ambulatory Visit: Payer: Self-pay

## 2018-11-25 ENCOUNTER — Ambulatory Visit (INDEPENDENT_AMBULATORY_CARE_PROVIDER_SITE_OTHER): Payer: Medicare HMO | Admitting: Internal Medicine

## 2018-11-25 DIAGNOSIS — I272 Pulmonary hypertension, unspecified: Secondary | ICD-10-CM

## 2018-11-25 DIAGNOSIS — J9612 Chronic respiratory failure with hypercapnia: Secondary | ICD-10-CM

## 2018-11-25 DIAGNOSIS — J9611 Chronic respiratory failure with hypoxia: Secondary | ICD-10-CM

## 2018-11-25 DIAGNOSIS — J449 Chronic obstructive pulmonary disease, unspecified: Secondary | ICD-10-CM | POA: Diagnosis not present

## 2018-11-25 MED ORDER — BREO ELLIPTA 200-25 MCG/INH IN AEPB: 1.0000 | INHALATION_SPRAY | Freq: Every day | RESPIRATORY_TRACT | 0 refills | Status: DC

## 2018-11-25 MED ORDER — BREO ELLIPTA 100-25 MCG/INH IN AEPB: 1.0000 | INHALATION_SPRAY | Freq: Every day | RESPIRATORY_TRACT | 0 refills | Status: DC

## 2018-11-25 NOTE — Progress Notes (Signed)
Subjective:     Patient ID: Denise [name], female   DOB: 1945/10/13,    MRN: [id number]    Brief patient profile:  6 yobf  Quit smoking 1989 with doe then dx'd with copd in [place] in mid 1990s where lived  There x 25 years and started on spiriva around 2005-10 and helped some and since 2017 back in [place] and breathing generally better in [place] but not doing as many steps and changed from spiriva to symb but not taking it regulary and no need for rescue inhaler referred to pulmonary clinic 09/11/2016 by Dr   Malena Edman initally with GOLD II criteria    History of Present Illness  09/11/2016 1st [facility] office visit/ Jeffery Gammell  Re GOLD II copd  Chief Complaint  Patient presents with  . pulmonary consult    Referred by Dr. Carlis Abbott for COPD. 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 Follow-up    She states she is needing letter releasing her to return to work.  She states her breathing has improved.   Dyspnea:  mb and back flat better since symbicort 160 though hfa quite poor  Cough: none Sleeping: bed flat/ 2 pillows SABA use: rarely since symbicort 02: have it but not using x sometimes hs   rec Plan A = Automatic = Stiolto 2 pffs each am only and no more Symbicort  Work on inhaler technique:  relax and gently blow all the way out then take a nice smooth deep breath back in, triggering the inhaler at same time you start breathing in.  Hold for up to 5 seconds if you can.  Rinse and gargle with water when done Plan  B = Backup Only use your albuterol as a rescue medication to be used if you can't catch your breath by resting or doing a relaxed purse lip breathing pattern.  - The less you use it, the better it will work when you need it. - Ok to use the inhaler up to 2 puffs  every 4 hours if you must but call for appointment if use goes up over your usual need - Don't leave home without it !!  (think of it like the spare tire for your car)  Ok to return to light duty only  Keep previous appt      10/17/2017  f/u ov/Ashyah Quizon re:  GOLD II copd / cor pulmonale - not using 02 as rec  Chief Complaint  Patient presents with  . Follow-up    Breathing is about the same. She has good days and bad days. She is using her albuterol inhaler 2 x daily on average.   Dyspnea:  mb and back with 02 is easier than 02 but still not using it Cough: minimal Sleeping: bed flat/ 2pillows SABA use: sev times a day  02:  Using 2lpm at bedtime and sometimes with activty   rec Continue stiolto  2 pffs each am  02 2lpm at bedtime and 2lpm with any activity        NP 01/09/2018  COPD medication: - Take Stiolto every day- 2 puffs in the morning  - Use Albuterol/Ventolin rescue inhaler only as needed - take 2 puffs every 4-6 hours for shortness of breath or wheezing  Oxygen:  - Wear 1-2L oxygen to keep O2 >90% during the day and on exertion  - Wear 2L oxygen at night  Follow-up: - 2-3 months with Dr. Melvyn Novas and as needed    03/12/2018  f/u ov/Kasmira Cacioppo re:   GOLD II / cor pulmonale, thoroughly confused with instructions Chief Complaint  Patient presents with  . Follow-up    Breathing worse since the last visit. She is using her albuterol inhaler 2 x per day.   Dyspnea:  Room to room worse since stopped prednisone/ thinks it may have been helping but clear she did not taper it as rec at last ov Cough: no Sleeping: bed flat/ 2-3pillows otherwise can't breathe SABA use: 2 x daily on generic advair  02: 24/7 at 2lpm  rec Pulse  oximetry is available at [facility] and you should adjust the flow to keep it above 90% Depomedrol 120 mg IM today Start back on stiolto 2 pffs each am Only use your albuterol (either ventolin or proair but not both) as a rescue medication Work on inhaler technique:  Add furosemide back at 20 mg daily and this should bring the swelling down      06/26/2018  f/u ov/Boss Danielsen re: copd/ cor pulmonale/ chronic resp failure with cor pulmonale now on Tyvaso per Dr Aundra Dubin maint on wixela 250 as can't afford stiolto and wixela is generic advair  Chief Complaint  Patient presents with  . Follow-up    COPD. Patient reports that without her oxygen its hard for her to breath. She reports using her rescue inhaler at least 2 times daily.   Dyspnea:  Walking in house with therapist is about all she can do > w/c when goes out  Cough: none Sleeping: bed is flat, turned on side  SABA use: twice daily  02: 2lpm 24/7  rec Only use your albuterol (proair)as a rescue medication  Work on inhaler technique:    10/03/18 cards note unable to tol yvaso = cough and sob worse so suspect all Harrison III   10/07/18 Phone visit  rec Goal is to keep the 02 saturations above 90% by adjusting the 02 flow up as needed with activity but may need less at rest sitting.  Ok to increase  albuterol up to 2 puffs every 4 hours as needed if you can't catch your breath Continue wixella for now and we will consider you for trelegy on return  Prednisone 10 mg take  4 each am x 2 days,   2 each am x 2 days,  1 each am x 2 days and stop     10/22/2018  f/u ov/Tonnie Friedel re: copd/ cor pulmonale - again thoroughly confused with details of care as is her husband Chief Complaint  Patient presents with  . Follow-up    Pt states her breathing has been progressively worse since the last visit. She is using her albuterol inhaler 2 x daily on average.    Dyspnea:  Room to room / better on prednisone  Cough: dry  Sleeping: on side  SABA use: as  above  02: 24/7 adjusts 2-4lpm  rec Prednisone 10  Mg Take 4  for three days 3 for three days 2 for three days 1 for three days and stop  Stop Breo and start Trelegy one click each am  Only use your albuterol as a rescue medication   Please schedule a follow up office visit in 2 weeks, sooner if needed  - add: be sure to clarify on each ov how she is using 02    11/25/2018  f/u ov/Rebie Peale re: copd/ cor pulmonale, has not started trelegy yet but "at pharmacy"  Chief Complaint  Patient presents with  . Follow-up    Recent hospital admit for stroke. She states her breathing has improved some since the last visit.    Dyspnea:  25 ft even on 4lpm  Cough: no  Sleeping: ok at hs on side two pillows  SABA use: using saba maybe before lunch p am  Incruse only  02: 4lpm 24/7    No obvious day to day or daytime variability or assoc excess/ purulent sputum or mucus plugs or hemoptysis or cp or chest tightness, subjective wheeze or overt sinus or hb symptoms.   Sleeping as above without nocturnal  or early am exacerbation  of respiratory  c/o's or need for noct saba. Also denies any obvious fluctuation of symptoms with weather or environmental changes or other aggravating or alleviating factors except as outlined above   No unusual exposure hx or h/o childhood pna/ asthma or knowledge of premature birth.  Current Allergies, Complete Past Medical History, Past Surgical History, Family History, and Social History were reviewed in [employer] record.  ROS  The following are not active complaints unless bolded Hoarseness, sore throat, dysphagia, dental problems, itching, sneezing,  nasal congestion or discharge of excess mucus or purulent secretions, ear ache,   fever, chills, sweats, unintended wt loss or wt gain, classically pleuritic or exertional cp,  orthopnea pnd or arm/hand swelling  or leg swelling, presyncope, palpitations, abdominal pain, anorexia, nausea, vomiting,  diarrhea  or change in bowel habits or change in bladder habits, change in stools or change in urine, dysuria, hematuria,  rash, arthralgias, visual complaints, headache, numbness, weakness or ataxia or problems with walking or coordination,  change in mood or  memory.        Current Meds  Medication Sig  . albuterol (VENTOLIN HFA) 108 (90 Base) MCG/ACT inhaler INHALE 1-2 PUFFS INTO THE LUNGS EVERY 4 HOURS AS NEEDED FOR WHEEZING OR SHORTNESS OF BREATH. (Patient taking differently: Inhale 1-2 puffs into the lungs every 4 (four) hours as needed for wheezing or shortness of breath. INHALE 1-2 PUFFS INTO THE LUNGS EVERY 4 HOURS AS NEEDED FOR WHEEZING OR SHORTNESS OF BREATH.)  . aspirin EC 325 MG EC tablet Take 1 tablet (325 mg total) by mouth daily.  [name] atorvastatin (LIPITOR) 20 MG tablet Take 1 tablet (20 mg total) by mouth at bedtime.  . bisoprolol (ZEBETA) 5 MG tablet Take 2.5 mg by mouth daily.  . Blood Glucose Calibration (ACCU-CHEK AVIVA) SOLN USE AS INSTRUCTED TO TEST BLOOD SUGAR DAILY (Patient taking differently: 1 each by Other route daily. )  . Blood Glucose Monitoring Suppl (ACCU-CHEK AVIVA PLUS) w/Device KIT Use as instructed to test blood sugar up to 3 times daily (Patient taking differently: 1 each by Other route 3 (three) times daily. )  . furosemide (LASIX) 20 MG tablet Take 20 mg by mouth daily.  [name] glucose blood (ACCU-CHEK AVIVA PLUS) test strip Use as instructed to test blood sugar up to 3 times daily  .  isosorbide-hydrALAZINE (BIDIL) 20-37.5 MG tablet Take 1 tablet by mouth 3 (three) times daily.  . Lancets (ACCU-CHEK SOFT TOUCH) lancets Use as instructed to test blood sugar up to 3 times daily (Patient taking differently: 1 each by Other route 3 (three) times daily. )  . metoprolol tartrate (LOPRESSOR) 25 MG tablet Take 0.5 tablets (12.5 mg total) by mouth 2 (two) times daily.  . mirtazapine (REMERON) 15 MG tablet TAKE 1 TABLET BY MOUTH AT BEDTIME. FOR DEPRESSION AND APPETITE. (Patient  taking differently: Take 15 mg by mouth at bedtime. )  . OXYGEN Inhale 4 L into the lungs continuous.   . potassium chloride (KLOR-CON M10) 10 MEQ tablet Take 1 tablet (10 mEq total) by mouth daily. (Patient taking differently: Take 5 mEq by mouth daily. )  . spironolactone (ALDACTONE) 25 MG tablet Take 1 tablet (25 mg total) by mouth daily. Take at night  . umeclidinium bromide (INCRUSE ELLIPTA) 62.5 MCG/INH AEPB Inhale 1 puff into the lungs daily.                Objective:   Physical Exam  wc bound elderly thin chronically ill  bf nad   11/25/2018        94  10/22/2018      108  06/26/2018        103 05/02/2018        108  03/12/2018        128  12/19/2017       112  11/01/2017        118  10/17/2017        114  10/05/2017        120   10/02/2017        119   07/27/16 144 lb 12.8 oz (65.7 kg)  06/02/16 136 lb 6.4 oz (61.9 kg)  11/22/15 156 lb 12.8 oz (71.1 kg)      Reports full dentures    HEENT : pt wearing mask not removed for exam due to covid - 19 concerns.    NECK :  without JVD/Nodes/TM/ nl carotid upstrokes bilaterally   LUNGS: no acc muscle use,  Mild barrel  contour chest wall with bilateral  Distant bs s audible wheeze and  without cough on insp or exp maneuvers  and mild  Hyperresonant  to  percussion bilaterally     CV:  RRR  no s3 or murmur or increase in P2, and no edema   ABD:  soft and nontender with pos end  insp Hoover's  in the supine position. No bruits or organomegaly appreciated, bowel sounds nl  MS:     ext warm with muscle wasting, calf tenderness, cyanosis or clubbing No obvious joint restrictions   SKIN: warm and dry without lesions    NEURO:  alert, approp, nl sensorium with  no motor or cerebellar deficits apparent.           I personally reviewed images and agree with radiology impression as follows:   Chest CTa 10/29/2018 1. No CT evidence of pulmonary embolism. 2. Moderate cardiomegaly with evidence of right heart  dysfunction. Correlation with echocardiogram recommended. 3. Small left pleural effusion with subsegmental atelectasis of the left lower lobe. 4. Severe emphysema with findings of pulmonary hypertension. 5. Aortic Atherosclerosis (ICD10-I70.0) and Emphysema (ICD10-J43.9).        Assessment:

## 2018-11-25 NOTE — Patient Instructions (Addendum)
Trelegy one click each am   = Breo 100 plus Incruse  (so when you start trelegy you stop the Breo and incruse ) .   Please schedule a follow up visit in 6 months but call sooner if needed

## 2018-11-26 ENCOUNTER — Encounter: Payer: Self-pay | Admitting: Internal Medicine

## 2018-11-26 NOTE — Assessment & Plan Note (Signed)
Assoc with   Cor pulmonale dx 09/2017 so rx 02 1-2 lpm 24/7  - 10/17/2017  Walked RA x 3 laps @ 185 ft each stopped due to  End of study, nl pace,  desat to 87 on 3rd lap - 12/19/2017  Saturations on Room Air at Rest =91 % and while Ambulating = 87%  But  on  2 Liters of pulsed oxygen while Ambulating =93% so rec POC 2lpm walking / use at rest if sats under 90%  - HC03 05/02/2018  = 34  - HC03 11/18/18    = 30    Adequate control on present rx, reviewed in detail with pt > no change in rx needed     >>> f/u in 6 m to reduce exp to covid 19  Pt informed of the seriousness of COVID 19 infection as a direct risk to their health  and safey and to those of their loved ones and should continue to wear facemask in public and minimize exposure to public locations but especially avoid any area or activity where non-close contacts are not observing distancing or wearing an appropriate face mask.   I had an extended discussion with the patient reviewing all relevant studies completed to date and  lasting 15 to 20 minutes of a 25 minute visit    I performed detailed device teaching using a teach back method which extended face to face time for this visit (see above)  Each maintenance medication was reviewed in detail including emphasizing most importantly the difference between maintenance and prns and under what circumstances the prns are to be triggered using an action plan format that is not reflected in the computer generated alphabetically organized AVS which I have not found useful in most complex patients, especially with respiratory illnesses  Please see AVS for specific instructions unique to this visit that I personally wrote and verbalized to the the pt in detail and then reviewed with pt  by my nurse highlighting any  changes in therapy recommended at today's visit to their plan of care.

## 2018-11-26 NOTE — Assessment & Plan Note (Signed)
See [place] 09/20/17  With  low CO ? Accuracy of PVR  - tyvaso d/c'd 10/03/18 by [facility] due to cough, improved   Well comensated for now, rx is to maintain euvolemia per chf clinic and adequate 02

## 2018-11-26 NOTE — Assessment & Plan Note (Addendum)
Quit smoking 1989 - Spirometry 09/11/2016  FEV1 1.03 (52%)  Ratio 54 mild curvature, on no rx - 09/11/2016  After extensive coaching HFA effectiveness =    50% from baseline 10% > continue  symb 160 2bid but consider change to bevespi or stiolto  - 10/02/2017  After extensive coaching inhaler device,  effectiveness =    50% from a baseline near 0 > rechallenge with symbicort 160 x 2 bid x 2 weeks then re-group  - 10/05/2017    try stiolto x 2pffs x 2 week sample  - PFT's  11/01/2017  FEV1 0.95 (52 % ) ratio fev1/vc = 69%  p 3 % improvement from saba p ? Stiolto(not sure she used it)  prior to study with DLCO  30/30c % corrects to 59 % for alv volume  With air trapping pattern  confirmed on cxr as well - 05/02/2018   Try breo  - HRCT 03/19/18 c/w mod emphysema, no ILD , some dep scarring/ atx -  11/25/2018  After extensive coaching inhaler device,  effectiveness =    75% wit dpi (short breath hold, short Ti) > change to trelegy     Group D in terms of symptom/risk and laba/lama/ICS  therefore appropriate rx at this point >>>  Breo/incruse = trelegy and should be about the same price as the two separate devices.  Reviewed: Advised:  formulary restrictions will be an ongoing challenge for the forseable future and I would be happy to pick an alternative if the pt will first  provide me a list of them -  pt  will need to return here for training for any new device that is required eg dpi vs hfa vs respimat.    In the meantime we can always provide samples so that the patient never runs out of any needed respiratory medications.

## 2018-11-27 ENCOUNTER — Emergency Department (HOSPITAL_COMMUNITY): Payer: Medicare HMO

## 2018-11-27 ENCOUNTER — Inpatient Hospital Stay (HOSPITAL_COMMUNITY)
Admission: EM | Admit: 2018-11-27 | Discharge: 2018-12-06 | DRG: 064 | Disposition: A | Discharge status: Skilled Nursing Facility | Payer: Medicare HMO | Attending: Internal Medicine | Admitting: Internal Medicine

## 2018-11-27 ENCOUNTER — Encounter (HOSPITAL_COMMUNITY): Payer: Self-pay | Admitting: Emergency Medicine

## 2018-11-27 ENCOUNTER — Other Ambulatory Visit: Payer: Self-pay

## 2018-11-27 DIAGNOSIS — I6930 Unspecified sequelae of cerebral infarction: Secondary | ICD-10-CM | POA: Diagnosis not present

## 2018-11-27 DIAGNOSIS — I5032 Chronic diastolic (congestive) heart failure: Secondary | ICD-10-CM | POA: Diagnosis present

## 2018-11-27 DIAGNOSIS — R531 Weakness: Secondary | ICD-10-CM | POA: Diagnosis not present

## 2018-11-27 DIAGNOSIS — R600 Localized edema: Secondary | ICD-10-CM | POA: Diagnosis not present

## 2018-11-27 DIAGNOSIS — E870 Hyperosmolality and hypernatremia: Secondary | ICD-10-CM | POA: Diagnosis not present

## 2018-11-27 DIAGNOSIS — I6350 Cerebral infarction due to unspecified occlusion or stenosis of unspecified cerebral artery: Secondary | ICD-10-CM | POA: Diagnosis not present

## 2018-11-27 DIAGNOSIS — Z87891 Personal history of nicotine dependence: Secondary | ICD-10-CM | POA: Diagnosis not present

## 2018-11-27 DIAGNOSIS — E785 Hyperlipidemia, unspecified: Secondary | ICD-10-CM | POA: Diagnosis not present

## 2018-11-27 DIAGNOSIS — M6281 Muscle weakness (generalized): Secondary | ICD-10-CM | POA: Diagnosis not present

## 2018-11-27 DIAGNOSIS — Z681 Body mass index (BMI) 19 or less, adult: Secondary | ICD-10-CM | POA: Diagnosis not present

## 2018-11-27 DIAGNOSIS — I10 Essential (primary) hypertension: Secondary | ICD-10-CM | POA: Diagnosis present

## 2018-11-27 DIAGNOSIS — F419 Anxiety disorder, unspecified: Secondary | ICD-10-CM | POA: Diagnosis not present

## 2018-11-27 DIAGNOSIS — J9611 Chronic respiratory failure with hypoxia: Secondary | ICD-10-CM | POA: Diagnosis not present

## 2018-11-27 DIAGNOSIS — I509 Heart failure, unspecified: Secondary | ICD-10-CM | POA: Diagnosis not present

## 2018-11-27 DIAGNOSIS — R29810 Facial weakness: Secondary | ICD-10-CM | POA: Diagnosis present

## 2018-11-27 DIAGNOSIS — E43 Unspecified severe protein-calorie malnutrition: Secondary | ICD-10-CM | POA: Diagnosis not present

## 2018-11-27 DIAGNOSIS — F411 Generalized anxiety disorder: Secondary | ICD-10-CM | POA: Diagnosis not present

## 2018-11-27 DIAGNOSIS — N183 Chronic kidney disease, stage 3 unspecified: Secondary | ICD-10-CM | POA: Diagnosis present

## 2018-11-27 DIAGNOSIS — M2550 Pain in unspecified joint: Secondary | ICD-10-CM | POA: Diagnosis not present

## 2018-11-27 DIAGNOSIS — I69354 Hemiplegia and hemiparesis following cerebral infarction affecting left non-dominant side: Secondary | ICD-10-CM

## 2018-11-27 DIAGNOSIS — J449 Chronic obstructive pulmonary disease, unspecified: Secondary | ICD-10-CM | POA: Diagnosis not present

## 2018-11-27 DIAGNOSIS — I639 Cerebral infarction, unspecified: Secondary | ICD-10-CM

## 2018-11-27 DIAGNOSIS — J439 Emphysema, unspecified: Secondary | ICD-10-CM | POA: Diagnosis not present

## 2018-11-27 DIAGNOSIS — K219 Gastro-esophageal reflux disease without esophagitis: Secondary | ICD-10-CM | POA: Diagnosis present

## 2018-11-27 DIAGNOSIS — R5383 Other fatigue: Secondary | ICD-10-CM | POA: Diagnosis not present

## 2018-11-27 DIAGNOSIS — Z9981 Dependence on supplemental oxygen: Secondary | ICD-10-CM

## 2018-11-27 DIAGNOSIS — Z20828 Contact with and (suspected) exposure to other viral communicable diseases: Secondary | ICD-10-CM | POA: Diagnosis not present

## 2018-11-27 DIAGNOSIS — E119 Type 2 diabetes mellitus without complications: Secondary | ICD-10-CM | POA: Diagnosis not present

## 2018-11-27 DIAGNOSIS — I130 Hypertensive heart and chronic kidney disease with heart failure and stage 1 through stage 4 chronic kidney disease, or unspecified chronic kidney disease: Secondary | ICD-10-CM | POA: Diagnosis present

## 2018-11-27 DIAGNOSIS — Z85038 Personal history of other malignant neoplasm of large intestine: Secondary | ICD-10-CM | POA: Diagnosis not present

## 2018-11-27 DIAGNOSIS — Z7982 Long term (current) use of aspirin: Secondary | ICD-10-CM

## 2018-11-27 DIAGNOSIS — F329 Major depressive disorder, single episode, unspecified: Secondary | ICD-10-CM | POA: Diagnosis present

## 2018-11-27 DIAGNOSIS — R000 Tachycardia, unspecified: Secondary | ICD-10-CM | POA: Diagnosis not present

## 2018-11-27 DIAGNOSIS — Z7951 Long term (current) use of inhaled steroids: Secondary | ICD-10-CM | POA: Diagnosis not present

## 2018-11-27 DIAGNOSIS — R52 Pain, unspecified: Secondary | ICD-10-CM | POA: Diagnosis not present

## 2018-11-27 DIAGNOSIS — Z90710 Acquired absence of both cervix and uterus: Secondary | ICD-10-CM

## 2018-11-27 DIAGNOSIS — Z03818 Encounter for observation for suspected exposure to other biological agents ruled out: Secondary | ICD-10-CM | POA: Diagnosis not present

## 2018-11-27 DIAGNOSIS — I63511 Cerebral infarction due to unspecified occlusion or stenosis of right middle cerebral artery: Secondary | ICD-10-CM

## 2018-11-27 DIAGNOSIS — J9612 Chronic respiratory failure with hypercapnia: Secondary | ICD-10-CM | POA: Diagnosis not present

## 2018-11-27 DIAGNOSIS — M5489 Other dorsalgia: Secondary | ICD-10-CM | POA: Diagnosis not present

## 2018-11-27 DIAGNOSIS — E876 Hypokalemia: Secondary | ICD-10-CM | POA: Diagnosis present

## 2018-11-27 DIAGNOSIS — E210 Primary hyperparathyroidism: Secondary | ICD-10-CM | POA: Diagnosis present

## 2018-11-27 DIAGNOSIS — I619 Nontraumatic intracerebral hemorrhage, unspecified: Secondary | ICD-10-CM | POA: Diagnosis present

## 2018-11-27 DIAGNOSIS — I272 Pulmonary hypertension, unspecified: Secondary | ICD-10-CM | POA: Diagnosis not present

## 2018-11-27 DIAGNOSIS — E1165 Type 2 diabetes mellitus with hyperglycemia: Secondary | ICD-10-CM | POA: Diagnosis present

## 2018-11-27 DIAGNOSIS — E1122 Type 2 diabetes mellitus with diabetic chronic kidney disease: Secondary | ICD-10-CM | POA: Diagnosis present

## 2018-11-27 DIAGNOSIS — I161 Hypertensive emergency: Secondary | ICD-10-CM | POA: Diagnosis present

## 2018-11-27 DIAGNOSIS — Z79899 Other long term (current) drug therapy: Secondary | ICD-10-CM | POA: Diagnosis not present

## 2018-11-27 DIAGNOSIS — R0602 Shortness of breath: Secondary | ICD-10-CM | POA: Diagnosis not present

## 2018-11-27 DIAGNOSIS — R5381 Other malaise: Secondary | ICD-10-CM | POA: Diagnosis not present

## 2018-11-27 DIAGNOSIS — I63233 Cerebral infarction due to unspecified occlusion or stenosis of bilateral carotid arteries: Secondary | ICD-10-CM | POA: Diagnosis not present

## 2018-11-27 DIAGNOSIS — I4891 Unspecified atrial fibrillation: Secondary | ICD-10-CM | POA: Diagnosis present

## 2018-11-27 DIAGNOSIS — Z7401 Bed confinement status: Secondary | ICD-10-CM | POA: Diagnosis not present

## 2018-11-27 DIAGNOSIS — J441 Chronic obstructive pulmonary disease with (acute) exacerbation: Secondary | ICD-10-CM | POA: Diagnosis not present

## 2018-11-27 DIAGNOSIS — R1312 Dysphagia, oropharyngeal phase: Secondary | ICD-10-CM | POA: Diagnosis not present

## 2018-11-27 HISTORY — DX: Cerebral infarction due to unspecified occlusion or stenosis of right middle cerebral artery: I63.511

## 2018-11-27 LAB — URINALYSIS, ROUTINE W REFLEX MICROSCOPIC
Bacteria, UA: NONE SEEN
Bilirubin Urine: NEGATIVE
Glucose, UA: NEGATIVE mg/dL
Hgb urine dipstick: NEGATIVE
Ketones, ur: NEGATIVE mg/dL
Leukocytes,Ua: NEGATIVE
Nitrite: NEGATIVE
Protein, ur: 30 mg/dL — AB
Specific Gravity, Urine: 1.019 (ref 1.005–1.030)
pH: 7 (ref 5.0–8.0)

## 2018-11-27 LAB — CBC WITH DIFFERENTIAL/PLATELET
Abs Immature Granulocytes: 0.04 10*3/uL (ref 0.00–0.07)
Basophils Absolute: 0 10*3/uL (ref 0.0–0.1)
Basophils Relative: 1 %
Eosinophils Absolute: 0.1 10*3/uL (ref 0.0–0.5)
Eosinophils Relative: 4 %
HCT: 45.5 % (ref 36.0–46.0)
Hemoglobin: 13.5 g/dL (ref 12.0–15.0)
Immature Granulocytes: 1 %
Lymphocytes Relative: 22 %
Lymphs Abs: 0.9 10*3/uL (ref 0.7–4.0)
MCH: 25.8 pg — ABNORMAL LOW (ref 26.0–34.0)
MCHC: 29.7 g/dL — ABNORMAL LOW (ref 30.0–36.0)
MCV: 86.8 fL (ref 80.0–100.0)
Monocytes Absolute: 0.4 10*3/uL (ref 0.1–1.0)
Monocytes Relative: 9 %
Neutro Abs: 2.5 10*3/uL (ref 1.7–7.7)
Neutrophils Relative %: 63 %
Platelets: 226 10*3/uL (ref 150–400)
RBC: 5.24 MIL/uL — ABNORMAL HIGH (ref 3.87–5.11)
RDW: 16.2 % — ABNORMAL HIGH (ref 11.5–15.5)
WBC: 4 10*3/uL (ref 4.0–10.5)
nRBC: 0 % (ref 0.0–0.2)

## 2018-11-27 LAB — SARS CORONAVIRUS 2 (TAT 6-24 HRS): SARS Coronavirus 2: NEGATIVE

## 2018-11-27 LAB — COMPREHENSIVE METABOLIC PANEL
ALT: 18 U/L (ref 0–44)
AST: 34 U/L (ref 15–41)
Albumin: 4.5 g/dL (ref 3.5–5.0)
Alkaline Phosphatase: 108 U/L (ref 38–126)
Anion gap: 8 (ref 5–15)
BUN: 21 mg/dL (ref 8–23)
CO2: 33 mmol/L — ABNORMAL HIGH (ref 22–32)
Calcium: 11.7 mg/dL — ABNORMAL HIGH (ref 8.9–10.3)
Chloride: 104 mmol/L (ref 98–111)
Creatinine, Ser: 1.17 mg/dL — ABNORMAL HIGH (ref 0.44–1.00)
GFR calc Af Amer: 54 mL/min — ABNORMAL LOW (ref >60)
GFR calc non Af Amer: 46 mL/min — ABNORMAL LOW (ref >60)
Glucose, Bld: 116 mg/dL — ABNORMAL HIGH (ref 70–99)
Potassium: 4.6 mmol/L (ref 3.5–5.1)
Sodium: 145 mmol/L (ref 135–145)
Total Bilirubin: 1 mg/dL (ref 0.3–1.2)
Total Protein: 7.8 g/dL (ref 6.5–8.1)

## 2018-11-27 LAB — CBG MONITORING, ED
Glucose-Capillary: 109 mg/dL — ABNORMAL HIGH (ref 70–99)
Glucose-Capillary: 103 mg/dL — ABNORMAL HIGH (ref 70–99)

## 2018-11-27 LAB — ETHANOL: Alcohol, Ethyl (B): <10 mg/dL (ref <10)

## 2018-11-27 MED ORDER — METOPROLOL TARTRATE 12.5 MG HALF TABLET
12.5000 mg | ORAL_TABLET | Freq: Two times a day (BID) | ORAL | Status: DC
  Administered 2018-11-28 (×2): 12.5 mg via ORAL
  Filled 2018-11-27 (×3): qty 1

## 2018-11-27 MED ORDER — ALBUTEROL SULFATE HFA 108 (90 BASE) MCG/ACT IN AERS
1.0000 | INHALATION_SPRAY | RESPIRATORY_TRACT | Status: DC | PRN
  Filled 2018-11-27: qty 6.7

## 2018-11-27 MED ORDER — MIRTAZAPINE 15 MG PO TABS
15.0000 mg | ORAL_TABLET | Freq: Every day | ORAL | Status: DC
  Administered 2018-11-28: 15 mg via ORAL
  Filled 2018-11-27: qty 1

## 2018-11-27 MED ORDER — INSULIN ASPART 100 UNIT/ML ~~LOC~~ SOLN
0.0000 [IU] | Freq: Every day | SUBCUTANEOUS | Status: DC
  Administered 2018-11-29: 3 [IU] via SUBCUTANEOUS
  Administered 2018-12-01 – 2018-12-04 (×2): 2 [IU] via SUBCUTANEOUS
  Filled 2018-11-27: qty 0.05

## 2018-11-27 MED ORDER — UMECLIDINIUM BROMIDE 62.5 MCG/INH IN AEPB
1.0000 | INHALATION_SPRAY | Freq: Every day | RESPIRATORY_TRACT | Status: DC
  Administered 2018-11-29 – 2018-12-06 (×7): 1 via RESPIRATORY_TRACT
  Filled 2018-11-27 (×2): qty 7

## 2018-11-27 MED ORDER — STROKE: EARLY STAGES OF RECOVERY BOOK
Freq: Once | Status: AC
  Administered 2018-11-28: 07:00:00
  Filled 2018-11-27 (×2): qty 1

## 2018-11-27 MED ORDER — FUROSEMIDE 20 MG PO TABS
20.0000 mg | ORAL_TABLET | Freq: Every day | ORAL | Status: DC
  Administered 2018-11-28: 20 mg via ORAL
  Filled 2018-11-27 (×2): qty 1

## 2018-11-27 MED ORDER — FLUTICASONE FUROATE-VILANTEROL 200-25 MCG/INH IN AEPB
1.0000 | INHALATION_SPRAY | Freq: Every day | RESPIRATORY_TRACT | Status: DC
  Administered 2018-11-29 – 2018-12-06 (×4): 1 via RESPIRATORY_TRACT
  Filled 2018-11-27 (×3): qty 28

## 2018-11-27 MED ORDER — SPIRONOLACTONE 25 MG PO TABS
25.0000 mg | ORAL_TABLET | Freq: Every day | ORAL | Status: DC
  Administered 2018-11-28: 25 mg via ORAL
  Filled 2018-11-27 (×2): qty 1

## 2018-11-27 MED ORDER — INSULIN ASPART 100 UNIT/ML ~~LOC~~ SOLN
0.0000 [IU] | Freq: Three times a day (TID) | SUBCUTANEOUS | Status: DC
  Administered 2018-11-28: 2 [IU] via SUBCUTANEOUS
  Administered 2018-11-28: 1 [IU] via SUBCUTANEOUS
  Administered 2018-11-29: 3 [IU] via SUBCUTANEOUS
  Administered 2018-11-30: 1 [IU] via SUBCUTANEOUS
  Administered 2018-11-30: 3 [IU] via SUBCUTANEOUS
  Administered 2018-11-30: 2 [IU] via SUBCUTANEOUS
  Administered 2018-12-01: 1 [IU] via SUBCUTANEOUS
  Administered 2018-12-01 (×2): 2 [IU] via SUBCUTANEOUS
  Administered 2018-12-02 (×2): 1 [IU] via SUBCUTANEOUS
  Administered 2018-12-02 – 2018-12-03 (×2): 2 [IU] via SUBCUTANEOUS
  Administered 2018-12-03 – 2018-12-04 (×2): 1 [IU] via SUBCUTANEOUS
  Administered 2018-12-04 (×2): 2 [IU] via SUBCUTANEOUS
  Administered 2018-12-05: 12:00:00 1 [IU] via SUBCUTANEOUS
  Administered 2018-12-05: 18:00:00 2 [IU] via SUBCUTANEOUS
  Filled 2018-11-27: qty 0.09

## 2018-11-27 MED ORDER — IOHEXOL 350 MG/ML SOLN
80.0000 mL | Freq: Once | INTRAVENOUS | Status: AC | PRN
  Administered 2018-11-27: 80 mL via INTRAVENOUS

## 2018-11-27 NOTE — ED Notes (Signed)
Pt made aware need urine sample. Husband at bedisde

## 2018-11-27 NOTE — ED Notes (Signed)
This RN assisted pt back up into bed

## 2018-11-27 NOTE — ED Provider Notes (Signed)
[facility] DEPT Provider Note   CSN: [id number] Arrival date & time: 11/27/18  1227     History   Chief Complaint No chief complaint on file.   HPI Denise [name] is a 73 y.o. female.     The history is provided by the patient, the spouse and medical records. No language interpreter was used.     73 year old female with history of diabetes, COPD, prior stroke, hypertension brought here via EMS from home for evaluation of altered mental status.  History obtained through patient and husband who is at bedside.  On November 6 patient was brought to the hospital for slurred speech and left-sided facial droop.  Patient was found to have an acute cerebellar stroke with subsequent hemorrhagic component complicating his stroke.  She was discharged from the hospital on the 17th.  She has been active, and to this morning when husband found patient hard to arouse in the morning with increased speech difficulty and worsening left-sided weakness.  Patient endorses bilateral shoulder pain.  Achy throbbing duration, moderate in severity.  No report of any recent sickness.  No complaints of nausea vomiting diarrhea or dysuria.  No cough.   Past Medical History:  Diagnosis Date   Altered mental status    Anxiety and depression    Chronic respiratory failure with hypoxia ([place]) 10/02/2017   Assoc with ? Cor pulmonale dx 09/2017 so rx 02 1-2 lpm 24/7  - 10/17/2017  Walked RA x 3 laps @ 185 ft each stopped due to  End of study, nl pace,  desat to 87 on 3rd lap - 12/19/2017  Saturations on Room Air at Rest =91 % and while Ambulating = 87%  But  on  2 Liters of pulsed oxygen while Ambulating =93% so rec POC 2lpm walking / use at rest if sats under 90%       Colon cancer ([facility]) 1988   Resected   COPD (chronic obstructive pulmonary disease) ([place])    Diabetes mellitus without complication ([place])    diet controlled- no meds. per pt   Diarrhea 04/02/2018    Diverticulosis    Hyperlipidemia    Hypertension    Hypoxia 10/30/2018   Insomnia    Primary hyperparathyroidism ([place])    SBO (small bowel obstruction) ([place])    Stroke ([place]) 10/2018   tpa administered   Tubular adenoma of rectum 02/23/2013   low grade   Vitamin D deficiency     Patient Active Problem List   Diagnosis Date Noted   Vaginal candidiasis    Acute blood loss anemia    Supplemental oxygen dependent    Uncontrolled type 2 diabetes mellitus with hyperglycemia ([place])    ICH (intracerebral hemorrhage) ([place]) s/p tPA administration 11/08/2018   Hypertensive urgency 11/08/2018   Cerebellar stroke, acute ([place]) 11/08/2018   History of ischemic stroke 11/04/2018   COPD exacerbation ([place]) 10/28/2018   Acute on chronic systolic CHF (congestive heart failure) ([place]) 10/28/2018   Elevated troponin 10/28/2018   Lactic acidosis 10/28/2018   GERD (gastroesophageal reflux disease) 10/28/2018   Acute on chronic respiratory failure with hypercapnia ([place]) 10/28/2018   Diabetes mellitus without complication-diet controled 10/28/2018   On home oxygen therapy 07/12/2018   CHF (congestive heart failure) ([place]) 05/16/2018   Chronic respiratory failure with hypoxia and hypercapnia ([place]) 05/03/2018   Borderline abnormal TFTs 05/02/2018   Type 2 diabetes mellitus ([place]) 04/26/2018   Unintended weight loss 04/26/2018   Lower extremity edema 02/20/2018  Chronic respiratory failure with hypoxia ([name]) 10/02/2017   Hematuria 09/27/2017   Pulmonary hypertension ([name]) c/w cor pulmonale    Protein-calorie malnutrition, severe 09/17/2017   Constipation    Hyperparathyroidism, primary ([place]) 07/27/2016   Loss of appetite 06/02/2016   COPD GOLD  II  spirometry, severe Emphysema and 02 dep 11/22/2015   Vitamin D deficiency 11/22/2015   Anxiety and depression 11/22/2015   Insomnia 11/22/2015   Hyperlipidemia 11/22/2015   Essential hypertension 11/22/2015    Hx of colonic polyps 02/10/2013   History of colon cancer 06/11/1986    Past Surgical History:  Procedure Laterality Date   ABDOMINAL HYSTERECTOMY  08/2015   BREAST BIOPSY Left    BREAST EXCISIONAL BIOPSY Right    COLON RESECTION  1980's   COLONOSCOPY     ENDOMETRIAL BIOPSY  2009   negative   INCONTINENCE SURGERY  2017   LOOP RECORDER INSERTION N/A 11/08/2018   Procedure: LOOP RECORDER INSERTION;  Surgeon: Constance Haw, MD;  Location: [facility];  Service: Cardiovascular;  Laterality: N/A;   RIGHT HEART CATH N/A 09/20/2017   Procedure: RIGHT HEART CATH;  Surgeon: Larey Dresser, MD;  Location: [facility];  Service: Cardiovascular;  Laterality: N/A;   RIGHT/LEFT HEART CATH AND CORONARY ANGIOGRAPHY N/A 03/19/2018   Procedure: RIGHT/LEFT HEART CATH AND CORONARY ANGIOGRAPHY;  Surgeon: Larey Dresser, MD;  Location: [facility];  Service: Cardiovascular;  Laterality: N/A;     OB History   No obstetric history on file.      Home Medications    Prior to Admission medications   Medication Sig Start Date End Date Taking? Authorizing Provider  albuterol (VENTOLIN HFA) 108 (90 Base) MCG/ACT inhaler INHALE 1-2 PUFFS INTO THE LUNGS EVERY 4 HOURS AS NEEDED FOR WHEEZING OR SHORTNESS OF BREATH. Patient taking differently: Inhale 1-2 puffs into the lungs every 4 (four) hours as needed for wheezing or shortness of breath. INHALE 1-2 PUFFS INTO THE LUNGS EVERY 4 HOURS AS NEEDED FOR WHEEZING OR SHORTNESS OF BREATH. 04/30/18   Parrett, Fonnie Mu, NP  aspirin EC 325 MG EC tablet Take 1 tablet (325 mg total) by mouth daily. 11/09/18   Donzetta Starch, NP  atorvastatin (LIPITOR) 20 MG tablet Take 1 tablet (20 mg total) by mouth at bedtime. 11/19/18   Love, Ivan Anchors, PA-C  bisoprolol (ZEBETA) 5 MG tablet Take 2.5 mg by mouth daily.    [provider]  Blood Glucose Calibration (ACCU-CHEK AVIVA) SOLN USE AS INSTRUCTED TO TEST BLOOD SUGAR DAILY Patient taking  differently: 1 each by Other route daily.  05/28/18   [name], NP  Blood Glucose Monitoring Suppl (ACCU-CHEK AVIVA PLUS) w/Device KIT Use as instructed to test blood sugar up to 3 times daily Patient taking differently: 1 each by Other route 3 (three) times daily.  05/28/18   [name], NP  fluticasone furoate-vilanterol (BREO ELLIPTA) 200-25 MCG/INH AEPB Inhale 1 puff into the lungs daily. 11/25/18   Tanda Rockers, MD  furosemide (LASIX) 20 MG tablet Take 20 mg by mouth daily.    [provider]  glucose blood (ACCU-CHEK AVIVA PLUS) test strip Use as instructed to test blood sugar up to 3 times daily 05/28/18   [name], NP  isosorbide-hydrALAZINE (BIDIL) 20-37.5 MG tablet Take 1 tablet by mouth 3 (three) times daily.    [provider]  Lancets (ACCU-CHEK SOFT TOUCH) lancets Use as instructed to test blood sugar up  to 3 times daily Patient taking differently: 1 each by Other route 3 (three) times daily.  05/28/18   [name], NP  metoprolol tartrate (LOPRESSOR) 25 MG tablet Take 0.5 tablets (12.5 mg total) by mouth 2 (two) times daily. 11/19/18   Love, Ivan Anchors, PA-C  mirtazapine (REMERON) 15 MG tablet TAKE 1 TABLET BY MOUTH AT BEDTIME. FOR DEPRESSION AND APPETITE. Patient taking differently: Take 15 mg by mouth at bedtime.  09/25/18   [name], NP  OXYGEN Inhale 4 L into the lungs continuous.     [provider]  potassium chloride (KLOR-CON M10) 10 MEQ tablet Take 1 tablet (10 mEq total) by mouth daily. Patient taking differently: Take 5 mEq by mouth daily.  11/19/18   Love, Ivan Anchors, PA-C  spironolactone (ALDACTONE) 25 MG tablet Take 1 tablet (25 mg total) by mouth daily. Take at night 11/19/18   Love, Pamela S, PA-C  umeclidinium bromide (INCRUSE ELLIPTA) 62.5 MCG/INH AEPB Inhale 1 puff into the lungs daily. 11/20/18   Love, Ivan Anchors, PA-C    Family History Family History  Problem Relation Age of Onset   Ovarian  cancer Mother    Breast cancer Neg Hx    Hyperparathyroidism Neg Hx    Colon cancer Neg Hx    Esophageal cancer Neg Hx    Stomach cancer Neg Hx     Social History Social History   Tobacco Use   Smoking status: Former Smoker    Packs/day: 1.00    Years: 30.00    Pack years: 30.00    Types: Cigarettes    Quit date: 1989    Years since quitting: 31.9   Smokeless tobacco: Never Used  Substance Use Topics   Alcohol use: No   Drug use: No     Allergies   Patient has no known allergies.   Review of Systems Review of Systems  All other systems reviewed and are negative.    Physical Exam Updated Vital Signs BP (!) 160/105 (BP Location: Right Arm)    Pulse 62    Temp 98 F (36.7 C) (Oral)    Resp (!) 22    Ht _0  (1.676 m)    Wt 42.6 kg    SpO2 95%    BMI 15.17 kg/m   Physical Exam Vitals signs and nursing note reviewed.  Constitutional:      General: She is not in acute distress.    Appearance: She is well-developed.  HENT:     Head: Atraumatic.  Eyes:     General: No visual field deficit.    Extraocular Movements: Extraocular movements intact.     Conjunctiva/sclera: Conjunctivae normal.     Pupils: Pupils are equal, round, and reactive to light.     Comments: 22m pupils bilaterally and reactive  Neck:     Musculoskeletal: Neck supple. No neck rigidity.  Cardiovascular:     Rate and Rhythm: Normal rate and regular rhythm.     Pulses: Normal pulses.     Heart sounds: Normal heart sounds.  Pulmonary:     Effort: Pulmonary effort is normal.     Breath sounds: Normal breath sounds.  Abdominal:     Palpations: Abdomen is soft.  Musculoskeletal:        General: No tenderness (no significant tenderness to palpation of upper and lower back and shoulder. no midline spine tenderness).  Skin:    Findings: No rash.  Neurological:     Mental Status: She  is alert.     GCS: GCS eye subscore is 4. GCS verbal subscore is 5. GCS motor subscore is 6.      Cranial Nerves: Dysarthria and facial asymmetry present.     Sensory: Sensation is intact.     Motor: Weakness present.     Coordination: Coordination abnormal. Finger-Nose-Finger Test abnormal.     Comments: Strength: 5/5 RUE, 1/5 LUE, 5/5 RLE, 3/5 LLE      ED Treatments / Results  Labs (all labs ordered are listed, but only abnormal results are displayed) Labs Reviewed  COMPREHENSIVE METABOLIC PANEL - Abnormal; Notable for the following components:      Result Value   CO2 33 (*)    Glucose, Bld 116 (*)    Creatinine, Ser 1.17 (*)    Calcium 11.7 (*)    GFR calc non Af Amer 46 (*)    GFR calc Af Amer 54 (*)    All other components within normal limits  CBC WITH DIFFERENTIAL/PLATELET - Abnormal; Notable for the following components:   RBC 5.24 (*)    MCH 25.8 (*)    MCHC 29.7 (*)    RDW 16.2 (*)    All other components within normal limits  CBG MONITORING, ED - Abnormal; Notable for the following components:   Glucose-Capillary 109 (*)    All other components within normal limits  SARS CORONAVIRUS 2 (TAT 6-24 HRS)  ETHANOL  URINALYSIS, ROUTINE W REFLEX MICROSCOPIC    EKG EKG Interpretation  Date/Time:  Wednesday November 27 2018 13:02:23 EST Ventricular Rate:  84 PR Interval:    QRS Duration: 69 QT Interval:  480 QTC Calculation: 568 R Axis:   -71 Text Interpretation: Sinus rhythm Atrial premature complex Left anterior fascicular block Nonspecific T abnormalities, lateral leads Prolonged QT interval Confirmed by Lennice Sites [phone]) on 11/27/2018 1:42:21 PM   Radiology Ct Head Wo Contrast  Result Date: 11/27/2018 CLINICAL DATA:  Left arm weakness, facial weakness, history of stroke, increased drowsiness EXAM: CT HEAD WITHOUT CONTRAST TECHNIQUE: Contiguous axial images were obtained from the base of the skull through the vertex without intravenous contrast. COMPARISON:  11/07/2018, MR brain, 11/05/2018 FINDINGS: Brain: There is decreased size and hyperdensity of  a subacute hemorrhagic infarction of the right cerebellar hemisphere (series 2, image 7). Decreased size and conspicuity of or a subacute edematous, nonhemorrhagic infarction of the left cerebellar hemisphere (series 2, image 6). Periventricular white matter hypodensity. Redemonstrated encephalomalacia of the left frontal lobe (series 3, image 20). Vascular: No hyperdense vessel or unexpected calcification. Skull: Normal. Negative for fracture or focal lesion. Sinuses/Orbits: No acute finding. Other: None. IMPRESSION: 1. There is decreased size and hyperdensity of a subacute hemorrhagic infarction of the right cerebellar hemisphere (series 2, image 7). 2. Decreased size and conspicuity of or a subacute edematous, nonhemorrhagic infarction of the left cerebellar hemisphere (series 2, image 6). 3.  Nonacute encephalomalacia of the left frontal lobe. 4.  Small-vessel white matter disease. Electronically Signed   By: Eddie Candle M.D.   On: 11/27/2018 15:11   Dg Chest Portable 1 View  Result Date: 11/27/2018 CLINICAL DATA:  Upper back pain. Fatigue. EXAM: PORTABLE CHEST 1 VIEW COMPARISON:  Radiograph 10/30/2018. CT 10/29/2018 FINDINGS: Loop recorder over the left chest wall.The cardiomediastinal contours are unchanged. Borderline cardiomegaly with aortic atherosclerosis. Mild chronic interstitial coarsening, emphysema on prior CT. Trace fluid in the right minor fissure with probable small right pleural effusion. No pulmonary edema. No consolidation or pneumothorax. No acute osseous abnormalities  are seen. IMPRESSION: 1. Probable small right pleural effusion with fluid minor fissure. 2. Chronic interstitial coarsening, emphysema on prior CT. 3. Stable borderline cardiomegaly with aortic atherosclerosis. Aortic Atherosclerosis (ICD10-I70.0). Electronically Signed   By: Keith Rake M.D.   On: 11/27/2018 14:38    Procedures Procedures (including critical care time)  Medications Ordered in ED Medications    iohexol (OMNIPAQUE) 350 MG/ML injection 80 mL (80 mLs Intravenous Contrast Given 11/27/18 1456)     Initial Impression / Assessment and Plan / ED Course  I have reviewed the triage vital signs and the nursing notes.  Pertinent labs & imaging results that were available during my care of the patient were reviewed by me and considered in my medical decision making (see chart for details).        BP (!) 160/105 (BP Location: Right Arm)    Pulse 62    Temp 98 F (36.7 C) (Oral)    Resp (!) 22    Ht _0  (1.676 m)    Wt 42.6 kg    SpO2 95%    BMI 15.17 kg/m    Final Clinical Impressions(s) / ED Diagnoses   Final diagnoses:  None    ED Discharge Orders    None     2:31 PM Patient presents with worsening confusion, left-sided facial droop, as well as worsening left upper and lower extremity strength, last known normal last night.  No visual field cut or neglect on exam.  Husband reporting symptoms worse than previously.  Patient was diagnosed with right-sided cerebellar stroke several weeks ago with slurred speech and left upper extremity weakness.  Symptoms worse, unsure if this is a recrudescence of her stroke due to underlying illness or new stroke.  From consultation with neurologist, Dr. Leonel Ramsay, who recommend head and neck CT angiogram as well as brain MRI for further evaluation.  If MRI shows new stroke patient will need to be transferred to [facility] for further work-up.  I will also screen for potential infection which include COVID-19, obtain chest x-ray and UA.  Care discussed with Dr. Ronnald Nian  3:41 PM Pt sign out to oncoming provider who will f/u on MRI result and determine admission either to [facility] or [facility].    Domenic Moras, PA-C 11/27/18 1542    Vanduser, [place], DO 12/15/18 [phone]

## 2018-11-27 NOTE — ED Notes (Addendum)
MRI at bedside. Pt OTF

## 2018-11-27 NOTE — ED Provider Notes (Signed)
Care assumed at shift change from [place], [place], pending CTA and MRI imaging for stroke work up. See his note for full HPI and workup. Briefly, pt presenting with L weakness, speech, facial droop. LKN last night. Hx stroke 2 weeks ago, R cerebellar, received TPA. Developed hemorrhagic complication. Residual LUE weakness and speech problem, however last night and this morning pt was hard to arouse, with inc lethargy, worsening L weakness, both upper and lower ext. Dysarthria. [facility] consulted, recommended MRI, head/neck CTA. If MRI positive, admit to [facility]. If neg for new, admit for worsening recent stroke.  Physical Exam  BP (!) 155/105   Pulse 84   Temp 99.4 F (37.4 C) (Rectal)   Resp (!) 27   Ht _0  (1.676 m)   Wt 42.6 kg   SpO2 98%   BMI 15.17 kg/m   Physical Exam Vitals signs and nursing note reviewed.  Constitutional:      Appearance: She is well-developed.     Comments: Pt is drowsy though alert to verbal stimuli  HENT:     Head: Normocephalic and atraumatic.  Eyes:     Conjunctiva/sclera: Conjunctivae normal.  Cardiovascular:     Rate and Rhythm: Normal rate.  Pulmonary:     Effort: Pulmonary effort is normal.  Neurological:     Mental Status: She is alert.  Psychiatric:        Mood and Affect: Mood normal.        Behavior: Behavior normal.    Results for orders placed or performed during the hospital encounter of 11/27/18  SARS CORONAVIRUS 2 (TAT 6-24 HRS) Nasopharyngeal Nasopharyngeal Swab   Specimen: Nasopharyngeal Swab  Result Value Ref Range   SARS Coronavirus 2 NEGATIVE NEGATIVE  Ethanol  Result Value Ref Range   Alcohol, Ethyl (B) <10 <10 mg/dL  Comprehensive metabolic panel  Result Value Ref Range   Sodium 145 135 - 145 mmol/L   Potassium 4.6 3.5 - 5.1 mmol/L   Chloride 104 98 - 111 mmol/L   CO2 33 (H) 22 - 32 mmol/L   Glucose, Bld 116 (H) 70 - 99 mg/dL   BUN 21 8 - 23 mg/dL   Creatinine, Ser 1.17 (H) 0.44 - 1.00 mg/dL   Calcium 11.7 (H) 8.9 - 10.3  mg/dL   Total Protein 7.8 6.5 - 8.1 g/dL   Albumin 4.5 3.5 - 5.0 g/dL   AST 34 15 - 41 U/L   ALT 18 0 - 44 U/L   Alkaline Phosphatase 108 38 - 126 U/L   Total Bilirubin 1.0 0.3 - 1.2 mg/dL   GFR calc non Af Amer 46 (L) >60 mL/min   GFR calc Af Amer 54 (L) >60 mL/min   Anion gap 8 5 - 15  CBC with Differential  Result Value Ref Range   WBC 4.0 4.0 - 10.5 K/uL   RBC 5.24 (H) 3.87 - 5.11 MIL/uL   Hemoglobin 13.5 12.0 - 15.0 g/dL   HCT 45.5 36.0 - 46.0 %   MCV 86.8 80.0 - 100.0 fL   MCH 25.8 (L) 26.0 - 34.0 pg   MCHC 29.7 (L) 30.0 - 36.0 g/dL   RDW 16.2 (H) 11.5 - 15.5 %   Platelets 226 150 - 400 K/uL   nRBC 0.0 0.0 - 0.2 %   Neutrophils Relative % 63 %   Neutro Abs 2.5 1.7 - 7.7 K/uL   Lymphocytes Relative 22 %   Lymphs Abs 0.9 0.7 - 4.0 K/uL   Monocytes Relative 9 %  Monocytes Absolute 0.4 0.1 - 1.0 K/uL   Eosinophils Relative 4 %   Eosinophils Absolute 0.1 0.0 - 0.5 K/uL   Basophils Relative 1 %   Basophils Absolute 0.0 0.0 - 0.1 K/uL   Immature Granulocytes 1 %   Abs Immature Granulocytes 0.04 0.00 - 0.07 K/uL  Urinalysis, Routine w reflex microscopic  Result Value Ref Range   Color, Urine YELLOW YELLOW   APPearance CLEAR CLEAR   Specific Gravity, Urine 1.019 1.005 - 1.030   pH 7.0 5.0 - 8.0   Glucose, UA NEGATIVE NEGATIVE mg/dL   Hgb urine dipstick NEGATIVE NEGATIVE   Bilirubin Urine NEGATIVE NEGATIVE   Ketones, ur NEGATIVE NEGATIVE mg/dL   Protein, ur 30 (A) NEGATIVE mg/dL   Nitrite NEGATIVE NEGATIVE   Leukocytes,Ua NEGATIVE NEGATIVE   RBC / HPF 0-5 0 - 5 RBC/hpf   WBC, UA 0-5 0 - 5 WBC/hpf   Bacteria, UA NONE SEEN NONE SEEN   Squamous Epithelial / LPF 0-5 0 - 5   Mucus PRESENT    Hyaline Casts, UA PRESENT    Crystals PRESENT (A) NEGATIVE  CBG monitoring, ED  Result Value Ref Range   Glucose-Capillary 109 (H) 70 - 99 mg/dL  CBG monitoring, ED  Result Value Ref Range   Glucose-Capillary 103 (H) 70 - 99 mg/dL   Ct Angio Head W Or Wo Contrast  Result  Date: 11/27/2018 CLINICAL DATA:  Facial weakness, left arm weakness, subacute cerebellar infarct EXAM: CT ANGIOGRAPHY HEAD AND NECK TECHNIQUE: Multidetector CT imaging of the head and neck was performed using the standard protocol during bolus administration of intravenous contrast. Multiplanar CT image reconstructions and MIPs were obtained to evaluate the vascular anatomy. Carotid stenosis measurements (when applicable) are obtained utilizing NASCET criteria, using the distal internal carotid diameter as the denominator. CONTRAST:  78m OMNIPAQUE IOHEXOL 350 MG/ML SOLN COMPARISON:  None. FINDINGS: Motion artifact is present. CTA NECK FINDINGS Aortic arch: Aortic arch is minimally imaged. Great vessel origins are patent. Right carotid system: Common, internal, and external carotid arteries are patent. Mild calcified plaque is present at the proximal ICA without measurable stenosis. Left carotid system: Common, internal, and external carotid arteries are patent. Small focus of plaque ulceration at the left ICA origin. There is no measurable stenosis. Vertebral arteries: Dominant right vertebral artery is patent with mild calcified plaque near the origin. Left vertebral artery is patent with calcified plaque causing apparent high-grade stenosis but no evidence of flow limitation. Skeleton: Degenerative changes of the cervical spine. Other neck: Mildly enlarged and heterogeneous thyroid. No neck mass or adenopathy within limitation of artifact. Upper chest: Severe emphysema. Review of the MIP images confirms the above findings CTA HEAD FINDINGS Anterior circulation: Intracranial internal carotid arteries are patent with atherosclerotic irregularity and mild calcified plaque. Anterior and middle cerebral arteries are patent. Posterior circulation: Intracranial vertebral arteries are patent. There is moderate stenosis of the left vertebral artery. Basilar artery is patent. Posterior cerebral arteries are patent. Venous  sinuses: As permitted by contrast timing, patent. Anatomic variants: Diminutive bilateral posterior communicating arteries. Review of the MIP images confirms the above findings IMPRESSION: No large vessel occlusion. No significant stenosis at the ICA origins. Plaque at the left vertebral artery origin causing severe stenosis but without evidence of flow limitation. Moderate stenosis of the intracranial left vertebral artery. Electronically Signed   By: PMacy MisM.D.   On: 11/27/2018 15:43   Ct Head Wo Contrast  Result Date: 11/27/2018 CLINICAL DATA:  Left arm  weakness, facial weakness, history of stroke, increased drowsiness EXAM: CT HEAD WITHOUT CONTRAST TECHNIQUE: Contiguous axial images were obtained from the base of the skull through the vertex without intravenous contrast. COMPARISON:  11/07/2018, MR brain, 11/05/2018 FINDINGS: Brain: There is decreased size and hyperdensity of a subacute hemorrhagic infarction of the right cerebellar hemisphere (series 2, image 7). Decreased size and conspicuity of or a subacute edematous, nonhemorrhagic infarction of the left cerebellar hemisphere (series 2, image 6). Periventricular white matter hypodensity. Redemonstrated encephalomalacia of the left frontal lobe (series 3, image 20). Vascular: No hyperdense vessel or unexpected calcification. Skull: Normal. Negative for fracture or focal lesion. Sinuses/Orbits: No acute finding. Other: None. IMPRESSION: 1. There is decreased size and hyperdensity of a subacute hemorrhagic infarction of the right cerebellar hemisphere (series 2, image 7). 2. Decreased size and conspicuity of or a subacute edematous, nonhemorrhagic infarction of the left cerebellar hemisphere (series 2, image 6). 3.  Nonacute encephalomalacia of the left frontal lobe. 4.  Small-vessel white matter disease. Electronically Signed   By: Eddie Candle M.D.   On: 11/27/2018 15:11   Ct Head Wo Contrast  Result Date: 11/07/2018 CLINICAL DATA:  Stroke  follow-up EXAM: CT HEAD WITHOUT CONTRAST TECHNIQUE: Contiguous axial images were obtained from the base of the skull through the vertex without intravenous contrast. COMPARISON:  CT and brain MRI from 2 days ago FINDINGS: Brain: Unchanged area of hemorrhage in the inferior right cerebellum with regional edema. Measurement is confounded by mixed density from brain parenchyma intervening blood. Local mass effect with fourth ventricular narrowing is stable. New small to moderate superficial left inferior cerebellar infarct. Small cortical infarcts along the right cerebral convexity that are known from comparison MRI. Nonacute small left superior frontal cortex infarct. No hydrocephalus or extra-axial collection. Vascular: Atherosclerosis.  No hyperdense vessel. Skull: Negative Sinuses/Orbits: Negative These results were communicated to at 9:41 amon 11/5/2020by text page via the [facility] messaging system. IMPRESSION: 1. New small to moderate infarct in the lateral left cerebellum. 2. Unchanged appearance of hemorrhagic infarct with swelling in the inferior right cerebellum. No hydrocephalus. 3. Unchanged appearance of nonhemorrhagic small cortical infarcts along the right cerebral convexity. Electronically Signed   By: Monte Fantasia M.D.   On: 11/07/2018 09:42   Ct Head Wo Contrast  Result Date: 11/05/2018 CLINICAL DATA:  Follow-up examination for intracranial hemorrhage. EXAM: CT HEAD WITHOUT CONTRAST TECHNIQUE: Contiguous axial images were obtained from the base of the skull through the vertex without intravenous contrast. COMPARISON:  Prior CT from 11/04/2018. FINDINGS: Brain: Hemorrhagic right cerebellar infarct again seen. Intraparenchymal hematoma centered at the inferior right cerebellum is not significantly changed in size as compared to previous, although the hemorrhage is slightly more well-defined as compared to prior. Surrounding regional mass effect and edema is little interval change as well. Similar  mass effect on the adjacent fourth ventricle and fourth ventricular outflow tract. Stable ventricular size without hydrocephalus or trapping. No other new acute intracranial hemorrhage. No new acute large vessel territory infarct. Atrophy with chronic microvascular ischemic disease again noted. No mass lesion or midline shift. No extra-axial fluid collection. Vascular: No hyperdense vessel. Scattered vascular calcifications noted within the carotid siphons. Skull: Scalp soft tissues and calvarium within normal limits. Sinuses/Orbits: Globes and orbital soft tissues within normal limits. Paranasal sinuses and mastoid air cells remain largely clear. Other: None. IMPRESSION: 1. No significant interval change in size of hemorrhagic right cerebellar infarct with similar mass regional mass effect. No hydrocephalus or ventricular trapping. 2. No  other new acute intracranial abnormality. Electronically Signed   By: Jeannine Boga M.D.   On: 11/05/2018 03:11   Ct Head Wo Contrast  Result Date: 11/04/2018 CLINICAL DATA:  Follow-up examination for acute stroke, status post tPA. EXAM: CT HEAD WITHOUT CONTRAST TECHNIQUE: Contiguous axial images were obtained from the base of the skull through the vertex without intravenous contrast. COMPARISON:  Prior head CT from earlier the same day. FINDINGS: Brain: Evolving acute to subacute right cerebellar infarct again seen. There has been interval hemorrhagic transformation with hemorrhage now seen within the area of infarction. Dominant intraparenchymal hemorrhage measures approximately 3.2 x 3.0 x 1.6 cm (estimated volume 8 cc). Associated regional mass effect throughout the right posterior fossa. Partial effacement of the adjacent fourth ventricle and fourth ventricular outflow tract which remains patent at this time. Stable ventricular size without obstructive hydrocephalus. No other acute intracranial hemorrhage. No other acute large vessel territory infarct. No mass  lesion or midline shift. No extra-axial fluid collection. Underlying age-related cerebral atrophy with moderate chronic microvascular ischemic disease again noted. Small chronic left frontal infarct noted as well. Vascular: No hyperdense vessel. Scattered vascular calcifications noted within the carotid siphons. Skull: Scalp soft tissues and calvarium within normal limits. Sinuses/Orbits: Globes and orbital soft tissues within normal limits. Paranasal sinuses remain clear. No mastoid effusion. Other: None. IMPRESSION: 1. Acute to subacute right cerebellar infarct with interval hemorrhagic transformation. Dominant intraparenchymal hemorrhage measures approximately 3.2 x 3.0 x 1.6 cm (estimated volume 8 CC). Associated regional mass effect throughout the right posterior fossa without obstructive hydrocephalus. 2. No other new acute intracranial abnormality. Critical Value/emergent results were called by telephone at the time of interpretation on 11/04/2018 at 7:46 pm to provider Dr. Cheral Marker, Who verbally acknowledged these results. Electronically Signed   By: Jeannine Boga M.D.   On: 11/04/2018 19:53   Ct Angio Neck W And/or Wo Contrast  Result Date: 11/27/2018 CLINICAL DATA:  Facial weakness, left arm weakness, subacute cerebellar infarct EXAM: CT ANGIOGRAPHY HEAD AND NECK TECHNIQUE: Multidetector CT imaging of the head and neck was performed using the standard protocol during bolus administration of intravenous contrast. Multiplanar CT image reconstructions and MIPs were obtained to evaluate the vascular anatomy. Carotid stenosis measurements (when applicable) are obtained utilizing NASCET criteria, using the distal internal carotid diameter as the denominator. CONTRAST:  7m OMNIPAQUE IOHEXOL 350 MG/ML SOLN COMPARISON:  None. FINDINGS: Motion artifact is present. CTA NECK FINDINGS Aortic arch: Aortic arch is minimally imaged. Great vessel origins are patent. Right carotid system: Common, internal, and  external carotid arteries are patent. Mild calcified plaque is present at the proximal ICA without measurable stenosis. Left carotid system: Common, internal, and external carotid arteries are patent. Small focus of plaque ulceration at the left ICA origin. There is no measurable stenosis. Vertebral arteries: Dominant right vertebral artery is patent with mild calcified plaque near the origin. Left vertebral artery is patent with calcified plaque causing apparent high-grade stenosis but no evidence of flow limitation. Skeleton: Degenerative changes of the cervical spine. Other neck: Mildly enlarged and heterogeneous thyroid. No neck mass or adenopathy within limitation of artifact. Upper chest: Severe emphysema. Review of the MIP images confirms the above findings CTA HEAD FINDINGS Anterior circulation: Intracranial internal carotid arteries are patent with atherosclerotic irregularity and mild calcified plaque. Anterior and middle cerebral arteries are patent. Posterior circulation: Intracranial vertebral arteries are patent. There is moderate stenosis of the left vertebral artery. Basilar artery is patent. Posterior cerebral arteries are patent. Venous sinuses:  As permitted by contrast timing, patent. Anatomic variants: Diminutive bilateral posterior communicating arteries. Review of the MIP images confirms the above findings IMPRESSION: No large vessel occlusion. No significant stenosis at the ICA origins. Plaque at the left vertebral artery origin causing severe stenosis but without evidence of flow limitation. Moderate stenosis of the intracranial left vertebral artery. Electronically Signed   By: Macy Mis M.D.   On: 11/27/2018 15:43   Ct Angio Chest Pe W Or Wo Contrast  Result Date: 10/30/2018 CLINICAL DATA:  73 year old female with shortness of breath. Evaluate for pulmonary embolism. EXAM: CT ANGIOGRAPHY CHEST WITH CONTRAST TECHNIQUE: Multidetector CT imaging of the chest was performed using the  standard protocol during bolus administration of intravenous contrast. Multiplanar CT image reconstructions and MIPs were obtained to evaluate the vascular anatomy. CONTRAST:  63m OMNIPAQUE IOHEXOL 350 MG/ML SOLN COMPARISON:  Chest CT dated 03/19/2018 FINDINGS: Evaluation is limited due to streak artifact caused by patient's arms as well as due to cachexia. Cardiovascular: Moderate cardiomegaly. No pericardial effusion. Multi vessel coronary vascular calcification. There is retrograde flow of contrast from the right atrium into the IVC in keeping with right heart dysfunction. Correlation with echocardiogram recommended. There is moderate atherosclerotic calcification of the thoracic aorta. Evaluation of the aorta is limited due to suboptimal opacification. There is dilatation of the main pulmonary trunk suggestive of a degree of pulmonary hypertension. There is no CT evidence of pulmonary embolism. Mediastinum/Nodes: No hilar or mediastinal adenopathy. Multiple thyroid nodules noted. No mediastinal fluid collection. Lungs/Pleura: Severe centrilobular emphysema. Small left pleural effusion with subsegmental atelectasis of the left lower lobe. Right lung base linear atelectasis/scarring. There is no pneumothorax. The central airways are patent. Upper Abdomen: No acute abnormality. Musculoskeletal: No chest wall abnormality. No acute or significant osseous findings. There is loss of subcutaneous fat and cachexia. Review of the MIP images confirms the above findings. IMPRESSION: 1. No CT evidence of pulmonary embolism. 2. Moderate cardiomegaly with evidence of right heart dysfunction. Correlation with echocardiogram recommended. 3. Small left pleural effusion with subsegmental atelectasis of the left lower lobe. 4. Severe emphysema with findings of pulmonary hypertension. 5. Aortic Atherosclerosis (ICD10-I70.0) and Emphysema (ICD10-J43.9). Electronically Signed   By: AAnner CreteM.D.   On: 10/30/2018 00:08   Mr  Angio Head Wo Contrast  Result Date: 11/05/2018 CLINICAL DATA:  Stroke, follow-up. EXAM: MRI HEAD WITHOUT CONTRAST MRA HEAD WITHOUT CONTRAST TECHNIQUE: Multiplanar, multiecho pulse sequences of the brain and surrounding structures were obtained without intravenous contrast. Angiographic images of the head were obtained using MRA technique without contrast. COMPARISON:  Prior head CT examinations 11/05/2018 and earlier FINDINGS: MRI HEAD FINDINGS Brain: There are multiple small acute/subacute cortical infarcts within the right frontal, parietal and occipital lobes in the right middle cerebral artery vascular territory. Of note, this includes small acute/subacute cortical infarcts within the right frontal lobe motor strip. Subtle petechial hemorrhage at site of several of these small acute infarct A large hemorrhagic right cerebellar infarct is difficult to accurately measure by MR modality, although appears similar to head CT performed earlier the same day. Posterior fossa mass effect appears similar to prior examination with persistent partial effacement of the inferior fourth ventricle and inferior displacement of the right cerebellar tonsil. No hydrocephalus. There are small chronic hemorrhagic cortical infarcts within the left frontal lobe (involving the left superior and middle frontal gyri) and paramedian left parietal lobe. Additional small chronic hemorrhagic and nonhemorrhagic cortical infarcts within the bilateral occipital lobes. Chronic lacunar infarcts within the  right thalamus and left cerebellum. There is a background of mild chronic small vessel ischemic disease. Mild generalized parenchymal atrophy. No supratentorial midline shift. No extra-axial fluid collection. Vascular: Reported separately Skull and upper cervical spine: The sagittal T1 weighted sequence is motion degraded. Within this limitation, no focal marrow lesion is identified Sinuses/Orbits: Visualized orbits demonstrate no acute  abnormality. Trace ethmoid sinus mucosal thickening. No significant mastoid effusion MRA HEAD FINDINGS The intracranial internal carotid arteries are patent. Atherosclerotic irregularity of these vessels bilaterally with sites of mild stenosis. The bilateral middle and anterior cerebral arteries are patent without significant proximal stenosis. No intracranial aneurysm is identified. The intracranial vertebral arteries are patent bilaterally. Atherosclerotic irregularity of the non dominant intracranial left vertebral artery with sites of up to moderate stenosis. The basilar artery is patent without significant stenosis. The posterior cerebral arteries are patent bilaterally. Apparent mild focal stenosis within the proximal P2 right posterior cerebral artery. These results were called by telephone at the time of interpretation on 11/05/2018 at 5:35 pm to provider [facility], who verbally acknowledged these results. IMPRESSION: MR brain: 1. Large acute/subacute hemorrhagic right cerebellar infarct not significantly changed from head CT earlier the same day. Posterior fossa mass effect is also similar with partial effacement of the fourth ventricle. No hydrocephalus 2. Multiple additional small acute/subacute cortical infarcts within the right MCA vascular territory, some of which demonstrate subtle petechial hemorrhage. 3. Multiple small chronic hemorrhagic and nonhemorrhagic cortical infarcts within the bilateral cerebral hemispheres. 4. Small chronic lacunar infarcts within the right thalamus and left cerebellum. Background of mild chronic small vessel ischemic disease. MRA head: 1. No intracranial large vessel occlusion. 2. Intracranial atherosclerotic disease. Most notably, there are sites of up to moderate stenosis within the non dominant left left vertebral artery. Additional mild stenoses as described. Electronically Signed   By: Kellie Simmering DO   On: 11/05/2018 17:38   Mr Brain Wo Contrast  Result Date:  11/27/2018 CLINICAL DATA:  Muscle weakness (generalized). Additional history provided: Facial weakness, left arm weakness, subacute cerebellar infarct. EXAM: MRI HEAD WITHOUT CONTRAST TECHNIQUE: Multiplanar, multiecho pulse sequences of the brain and surrounding structures were obtained without intravenous contrast. COMPARISON:  CT angiogram head/neck 11/27/2018, noncontrast head CT 11/27/2018, head CT 11/07/2018, MRI/MRA head 11/05/2018 FINDINGS: Brain: There is an acute infarct centered within the right thalamocapsular junction involving the adjacent white matter tracks superiorly and inferiorly. This measures 1.5 x 1.0 x 1.8 cm (AP x TV x CC). No evidence of acute infarct elsewhere within the brain. There has been expected evolution of a large hemorrhagic subacute right cerebellar infarct. There has also been expected evolution of a moderate-sized nonhemorrhagic subacute left cerebellar infarct. Superimposed bilateral chronic cerebellar lacunar infarcts. Redemonstrated are multiple small hemorrhagic and nonhemorrhagic cortical infarcts within the bilateral cerebral hemispheres. Redemonstrated chronic lacunar infarct within the right thalamus. Background of otherwise mild chronic small vessel ischemic disease Vascular: Flow voids maintained within the proximal large arterial vessels. Skull and upper cervical spine: No focal marrow lesion. Sinuses/Orbits: Visualized orbits demonstrate no acute abnormality. Mild ethmoid sinus mucosal thickening. No significant mastoid effusion. These results were called by telephone at the time of interpretation on 11/27/2018 at 5:21 pm to provider PA Quentin Cornwall, who verbally acknowledged these results. IMPRESSION: 1.5 x 1.0 x 1.8 cm acute infarct centered within the right thalamocapsular junction involving the adjacent white matter tracks superiorly and inferiorly. Expected evolution of subacute large hemorrhagic right cerebellar and moderate-sized nonhemorrhagic left cerebellar  infarcts. Multiple redemonstrated chronic infarcts  within the cerebral cortex, right thalamus and bilateral cerebellar hemispheres. Background of mild chronic small vessel ischemic disease. Electronically Signed   By: Kellie Simmering DO   On: 11/27/2018 17:22   Mr Brain Wo Contrast  Result Date: 11/05/2018 CLINICAL DATA:  Stroke, follow-up. EXAM: MRI HEAD WITHOUT CONTRAST MRA HEAD WITHOUT CONTRAST TECHNIQUE: Multiplanar, multiecho pulse sequences of the brain and surrounding structures were obtained without intravenous contrast. Angiographic images of the head were obtained using MRA technique without contrast. COMPARISON:  Prior head CT examinations 11/05/2018 and earlier FINDINGS: MRI HEAD FINDINGS Brain: There are multiple small acute/subacute cortical infarcts within the right frontal, parietal and occipital lobes in the right middle cerebral artery vascular territory. Of note, this includes small acute/subacute cortical infarcts within the right frontal lobe motor strip. Subtle petechial hemorrhage at site of several of these small acute infarct A large hemorrhagic right cerebellar infarct is difficult to accurately measure by MR modality, although appears similar to head CT performed earlier the same day. Posterior fossa mass effect appears similar to prior examination with persistent partial effacement of the inferior fourth ventricle and inferior displacement of the right cerebellar tonsil. No hydrocephalus. There are small chronic hemorrhagic cortical infarcts within the left frontal lobe (involving the left superior and middle frontal gyri) and paramedian left parietal lobe. Additional small chronic hemorrhagic and nonhemorrhagic cortical infarcts within the bilateral occipital lobes. Chronic lacunar infarcts within the right thalamus and left cerebellum. There is a background of mild chronic small vessel ischemic disease. Mild generalized parenchymal atrophy. No supratentorial midline shift. No  extra-axial fluid collection. Vascular: Reported separately Skull and upper cervical spine: The sagittal T1 weighted sequence is motion degraded. Within this limitation, no focal marrow lesion is identified Sinuses/Orbits: Visualized orbits demonstrate no acute abnormality. Trace ethmoid sinus mucosal thickening. No significant mastoid effusion MRA HEAD FINDINGS The intracranial internal carotid arteries are patent. Atherosclerotic irregularity of these vessels bilaterally with sites of mild stenosis. The bilateral middle and anterior cerebral arteries are patent without significant proximal stenosis. No intracranial aneurysm is identified. The intracranial vertebral arteries are patent bilaterally. Atherosclerotic irregularity of the non dominant intracranial left vertebral artery with sites of up to moderate stenosis. The basilar artery is patent without significant stenosis. The posterior cerebral arteries are patent bilaterally. Apparent mild focal stenosis within the proximal P2 right posterior cerebral artery. These results were called by telephone at the time of interpretation on 11/05/2018 at 5:35 pm to provider [facility], who verbally acknowledged these results. IMPRESSION: MR brain: 1. Large acute/subacute hemorrhagic right cerebellar infarct not significantly changed from head CT earlier the same day. Posterior fossa mass effect is also similar with partial effacement of the fourth ventricle. No hydrocephalus 2. Multiple additional small acute/subacute cortical infarcts within the right MCA vascular territory, some of which demonstrate subtle petechial hemorrhage. 3. Multiple small chronic hemorrhagic and nonhemorrhagic cortical infarcts within the bilateral cerebral hemispheres. 4. Small chronic lacunar infarcts within the right thalamus and left cerebellum. Background of mild chronic small vessel ischemic disease. MRA head: 1. No intracranial large vessel occlusion. 2. Intracranial atherosclerotic  disease. Most notably, there are sites of up to moderate stenosis within the non dominant left left vertebral artery. Additional mild stenoses as described. Electronically Signed   By: Kellie Simmering DO   On: 11/05/2018 17:38   Dg Chest Portable 1 View  Result Date: 11/27/2018 CLINICAL DATA:  Upper back pain. Fatigue. EXAM: PORTABLE CHEST 1 VIEW COMPARISON:  Radiograph 10/30/2018. CT 10/29/2018 FINDINGS: Loop  recorder over the left chest wall.The cardiomediastinal contours are unchanged. Borderline cardiomegaly with aortic atherosclerosis. Mild chronic interstitial coarsening, emphysema on prior CT. Trace fluid in the right minor fissure with probable small right pleural effusion. No pulmonary edema. No consolidation or pneumothorax. No acute osseous abnormalities are seen. IMPRESSION: 1. Probable small right pleural effusion with fluid minor fissure. 2. Chronic interstitial coarsening, emphysema on prior CT. 3. Stable borderline cardiomegaly with aortic atherosclerosis. Aortic Atherosclerosis (ICD10-I70.0). Electronically Signed   By: Keith Rake M.D.   On: 11/27/2018 14:38   Dg Chest Port 1 View  Result Date: 10/30/2018 CLINICAL DATA:  Shortness of breath. EXAM: PORTABLE CHEST 1 VIEW COMPARISON:  10/28/2018 FINDINGS: Heart size upper limits of normal. Chronic aortic atherosclerosis. The lungs are clear. The vascularity is normal. No effusions. No acute bone finding. IMPRESSION: No active disease. Chronic aortic atherosclerosis. Heart size upper limits of normal. Electronically Signed   By: Nelson Chimes M.D.   On: 10/30/2018 17:01   Ct Head Code Stroke Wo Contrast  Result Date: 11/04/2018 CLINICAL DATA:  Code stroke.  Left-sided weakness EXAM: CT HEAD WITHOUT CONTRAST TECHNIQUE: Contiguous axial images were obtained from the base of the skull through the vertex without intravenous contrast. COMPARISON:  2017 FINDINGS: Brain: There is no acute intracranial hemorrhage. New but chronic appearing small  left frontal infarct (series 3, image 17). There is patchy hypoattenuation within the right cerebellar hemisphere, which may reflect age-indeterminate infarction, noting artifact in this region. Additional areas of patchy confluent hypoattenuation in the supratentorial white matter are nonspecific med probably 5 moderate chronic microvascular ischemic changes. Prominence of the ventricles and sulci reflects mild generalized parenchymal volume loss. There is no extra-axial fluid collection Vascular: Hyperdensity along the basilar artery is likely artifactual. Skull: Calvarium is unremarkable. Sinuses/Orbits: Aerated.  Unremarkable. Other: None. ASPECTS [facility] Stroke Program Early CT Score) - Ganglionic level infarction (caudate, lentiform nuclei, internal capsule, insula, M1-M3 cortex): 7 - Supraganglionic infarction (M4-M6 cortex): 3 Total score (0-10 with 10 being normal): 10 IMPRESSION: 1. No acute intracranial hemorrhage. Suspected age-indeterminate right cerebellar infarction. 2. ASPECTS is 10 3. Chronic microvascular ischemic changes. Small chronic left frontal infarct. Electronically Signed   By: Macy Mis M.D.   On: 11/04/2018 14:35   Vas US Carotid  Result Date: 11/06/2018 Carotid Arterial Duplex Study Indications:       CVA. Comparison Study:  No prior study Performing Technologist: Maudry Mayhew MHA, RDMS, RVT, RDCS  Examination Guidelines: A complete evaluation includes B-mode imaging, spectral Doppler, color Doppler, and power Doppler as needed of all accessible portions of each vessel. Bilateral testing is considered an integral part of a complete examination. Limited examinations for reoccurring indications may be performed as noted.  Right Carotid Findings: +----------+--------+--------+--------+--------------------------+--------+           PSV cm/sEDV cm/sStenosisPlaque Description        Comments +----------+--------+--------+--------+--------------------------+--------+ CCA  Prox  43      9                                                  +----------+--------+--------+--------+--------------------------+--------+ CCA Distal43      11              smooth and heterogenous            +----------+--------+--------+--------+--------------------------+--------+ ICA Prox  21  6               heterogenous and irregular         +----------+--------+--------+--------+--------------------------+--------+ ICA Distal45      8                                                  +----------+--------+--------+--------+--------------------------+--------+ ECA       44      6               smooth and heterogenous            +----------+--------+--------+--------+--------------------------+--------+ +----------+--------+-------+----------------+-------------------+           PSV cm/sEDV cmsDescribe        Arm Pressure (mmHG) +----------+--------+-------+----------------+-------------------+ [id number]             Multiphasic, WNL                    +----------+--------+-------+----------------+-------------------+ +---------+--------+--+--------+--+---------+ VertebralPSV cm/s59EDV cm/s13Antegrade +---------+--------+--+--------+--+---------+  Left Carotid Findings: +----------+--------+--------+--------+----------------------+--------+           PSV cm/sEDV cm/sStenosisPlaque Description    Comments +----------+--------+--------+--------+----------------------+--------+ CCA Prox  68      13                                             +----------+--------+--------+--------+----------------------+--------+ CCA Distal76      16              smooth and homogeneous         +----------+--------+--------+--------+----------------------+--------+ ICA Prox  56      11                                             +----------+--------+--------+--------+----------------------+--------+ ICA Distal58      16                                              +----------+--------+--------+--------+----------------------+--------+ ECA       56      9                                              +----------+--------+--------+--------+----------------------+--------+ +----------+--------+--------+----------------+-------------------+           PSV cm/sEDV cm/sDescribe        Arm Pressure (mmHG) +----------+--------+--------+----------------+-------------------+ [id number]              Multiphasic, WNL                    +----------+--------+--------+----------------+-------------------+ +---------+--------+--+--------+-+---------+ VertebralPSV cm/s18EDV cm/s5Antegrade +---------+--------+--+--------+-+---------+  Summary: Right Carotid: Velocities in the right ICA are consistent with a 1-39% stenosis. Left Carotid: Velocities in the left ICA are consistent with a 1-39% stenosis. Vertebrals:  Bilateral vertebral arteries demonstrate antegrade flow. Subclavians: Normal flow hemodynamics were seen in bilateral subclavian              arteries. *See  table(s) above for measurements and observations.  Electronically signed by Curt Jews MD on 11/06/2018 at 6:29:27 PM.    Final    Vas [place] Lower Extremity Venous (dvt)  Result Date: 11/06/2018  Lower Venous Study Indications: Stroke.  Comparison Study: no prior Performing Technologist: Abram Sander RVS  Examination Guidelines: A complete evaluation includes B-mode imaging, spectral Doppler, color Doppler, and power Doppler as needed of all accessible portions of each vessel. Bilateral testing is considered an integral part of a complete examination. Limited examinations for reoccurring indications may be performed as noted.  +---------+---------------+---------+-----------+----------+--------------+ RIGHT    CompressibilityPhasicitySpontaneityPropertiesThrombus Aging +---------+---------------+---------+-----------+----------+--------------+ CFV      Full           Yes       Yes                                 +---------+---------------+---------+-----------+----------+--------------+ SFJ      Full                                                        +---------+---------------+---------+-----------+----------+--------------+ FV Prox  Full                                                        +---------+---------------+---------+-----------+----------+--------------+ FV Mid   Full                                                        +---------+---------------+---------+-----------+----------+--------------+ FV DistalFull                                                        +---------+---------------+---------+-----------+----------+--------------+ PFV      Full                                                        +---------+---------------+---------+-----------+----------+--------------+ POP      Full           Yes      Yes                                 +---------+---------------+---------+-----------+----------+--------------+ PTV      Full                                                        +---------+---------------+---------+-----------+----------+--------------+ PERO     Full                                                        +---------+---------------+---------+-----------+----------+--------------+   +---------+---------------+---------+-----------+----------+--------------+  LEFT     CompressibilityPhasicitySpontaneityPropertiesThrombus Aging +---------+---------------+---------+-----------+----------+--------------+ CFV      Full           Yes      Yes                                 +---------+---------------+---------+-----------+----------+--------------+ SFJ      Full                                                        +---------+---------------+---------+-----------+----------+--------------+ FV Prox  Full                                                         +---------+---------------+---------+-----------+----------+--------------+ FV Mid   Full                                                        +---------+---------------+---------+-----------+----------+--------------+ FV DistalFull                                                        +---------+---------------+---------+-----------+----------+--------------+ PFV      Full                                                        +---------+---------------+---------+-----------+----------+--------------+ POP      Full           Yes      Yes                                 +---------+---------------+---------+-----------+----------+--------------+ PTV      Full                                                        +---------+---------------+---------+-----------+----------+--------------+ PERO     Full                                                        +---------+---------------+---------+-----------+----------+--------------+     Summary: Right: There is no evidence of deep vein thrombosis in the lower extremity. No cystic structure found in the popliteal fossa. Left: There is no evidence of deep vein thrombosis in the lower extremity. No cystic structure found in the popliteal fossa.  *  See table(s) above for measurements and observations. Electronically signed by Curt Jews MD on 11/06/2018 at 6:27:42 PM.    Final     ED Course/Procedures   Clinical Course as of Nov 26 1837  Wed Nov 27, 2018  1721 Per radiology, Acute CVA right thalamocapsillary junction.    [JR]  9323 MRI results discussed with Dr. Leonel Ramsay with neurology. Will admit to [facility]. No interventions recommended at this time in the ED.   [JR]  5573 Discussed results with patient and husband. Agreeable with plan for admission.    [JR]  1750 Dr. Sloan Leiter with [facility] accepting admission to [facility].   [JR]    Clinical Course User Index [JR] Robinson, [place] N, PA-C    Procedures  MDM   Pt with new CVA to right thalamocapsular junction. Dr. Leonel Ramsay made aware, no emergent intervention recommended in the ED at this time. Pt admitted to hospitalist service at [facility] for further management.       Robinson, [place] N, PA-C 11/27/18 [place], [place], DO 12/02/18 2202

## 2018-11-27 NOTE — ED Notes (Signed)
[facility] called pt prepared for transport

## 2018-11-27 NOTE — ED Notes (Signed)
This RN called husband to notify of transfer but unable to reach him. Left a voicemail to call back

## 2018-11-27 NOTE — ED Provider Notes (Signed)
Medical screening examination/treatment/procedure(s) were conducted as a shared visit with non-physician practitioner(s) and myself.  I personally evaluated the patient during the encounter. Briefly, the patient is a 73 y.o. female who presents with worsening stroke symptoms, low back pain, generalized fatigue.  Patient with mild hypertension but otherwise unremarkable vitals.  On her home 4 L of oxygen.  Patient recently was admitted for stroke in which she received TPA.  She had hemorrhagic conversion of her stroke.  She has had left-sided deficits.  She was recently discharged from rehab and she has been back at home.  Husband states that patient was more difficult to arouse this morning.  He states that her left-sided weakness is worse than it was last night.  She has left-sided facial droop, 1+ out of 5 strength to the left upper extremity, 3+ out of 5 strength in the left lower extremity.  No visual field deficit.  Has slurred speech.  Overall difficult to tell if this is worsening of her prior stroke symptoms.  Husband is convinced that it is.  Could be reactivation but patient does have normal vitals.  No fever.  Will initiate infectious work-up along with stroke work-up.  She is outside the window for any acute intervention including TPA.  Talked with Dr. Leonel Ramsay on the phone with neurology and will get a CT and CTA of head and neck.  Will get stat MRI as well.  EKG shows sinus rhythm.  No ischemic changes.  Patient is not having any chest pain shortness of breath.  Back pain appears muscular in nature.  Patient has no significant anemia, electrolyte abnormality, kidney injury.  Alcohol level is negative.  Blood sugar is unremarkable.  Awaiting CT scan and MRI for further evaluation.  Will sign out to oncoming ED staff.  This chart was dictated using voice recognition software.  Despite best efforts to proofread,  errors can occur which can change the documentation meaning.    EKG  Interpretation  Date/Time:  Wednesday November 27 2018 13:02:23 EST Ventricular Rate:  84 PR Interval:    QRS Duration: 69 QT Interval:  480 QTC Calculation: 568 R Axis:   -71 Text Interpretation: Sinus rhythm Atrial premature complex Left anterior fascicular block Nonspecific T abnormalities, lateral leads Prolonged QT interval Confirmed by Lennice Sites [phone]) on 11/27/2018 1:42:21 PM           Lennice Sites, DO 11/27/18 1431

## 2018-11-27 NOTE — H&P (Signed)
History and Physical    Denise [name]:[id number] DOB: 10-31-1945 DOA: 11/27/2018  PCP: [name], NP  Patient coming from: Home.  I have personally briefly reviewed patient's old medical records available.   Chief Complaint: Unable to speak, more than usual weakness on the left side.  HPI: Denise [name] is a 73 y.o. female with medical history significant of type 2 diabetes not on treatment, hypertension, COPD with chronic hypoxic respiratory failure on 4 L oxygen, pulmonary hypertension who was recently admitted to the hospital with acute ischemic stroke, status post TPA with hemorrhagic conversion treated with cryoprecipitate and tranexamic acid, went to acute inpatient rehab and discharged home on 10/19/2018 comes back to the emergency room with her husband with more than usual weakness on the left side.  According to the husband at bedside, when she left the rehab, she was able to eat herself, she was somehow able to use her left hand.  She still had some weakness, however she was able to function and walk with support.  Today morning, her husband found her hard to arouse, found with increasing speech difficulty and worsening left-sided weakness.  Patient also complaining of some shoulder pain and aching.  She has some dysarthria, she agrees with husband's story and does not add any more history.  She uses 4 L oxygen at baseline and has no worsening shortness of breath or wheezing.  Denies any fever, chills, nausea, vomiting.  Appetite is always poor.  Denies any upper respiratory tract symptoms including cough cold.  Denies any diarrhea or urinary changes.  She did try to eat some at home before coming to the hospital with no coughing. ED Course: Blood pressures elevated 180s/106.  On 4 L oxygen.  Electrolytes normal.  CT head showed old stroke with evaluation.  MRI showed new right thalamocapsular stroke with evaluating old hemorrhagic strokes.  CTA with no acute  occlusion, severe stenosis left vertebral artery origin.  Present on previous examinations.  Neurology was consulted requested admission to [facility] to continue stroke work-up.  Review of Systems: all systems are reviewed and pertinent positive as per HPI otherwise rest are negative.    Past Medical History:  Diagnosis Date   Altered mental status    Anxiety and depression    Chronic respiratory failure with hypoxia ([place]) 10/02/2017   Assoc with ? Cor pulmonale dx 09/2017 so rx 02 1-2 lpm 24/7  - 10/17/2017  Walked RA x 3 laps @ 185 ft each stopped due to  End of study, nl pace,  desat to 87 on 3rd lap - 12/19/2017  Saturations on Room Air at Rest =91 % and while Ambulating = 87%  But  on  2 Liters of pulsed oxygen while Ambulating =93% so rec POC 2lpm walking / use at rest if sats under 90%       Colon cancer ([place]) 1988   Resected   COPD (chronic obstructive pulmonary disease) ([place])    Diabetes mellitus without complication ([place])    diet controlled- no meds. per pt   Diarrhea 04/02/2018   Diverticulosis    Hyperlipidemia    Hypertension    Hypoxia 10/30/2018   Insomnia    Primary hyperparathyroidism ([place])    SBO (small bowel obstruction) ([place])    Stroke ([place]) 10/2018   tpa administered   Tubular adenoma of rectum 02/23/2013   low grade   Vitamin D deficiency     Past Surgical History:  Procedure Laterality Date  ABDOMINAL HYSTERECTOMY  08/2015   BREAST BIOPSY Left    BREAST EXCISIONAL BIOPSY Right    COLON RESECTION  1980's   COLONOSCOPY     ENDOMETRIAL BIOPSY  2009   negative   INCONTINENCE SURGERY  2017   LOOP RECORDER INSERTION N/A 11/08/2018   Procedure: LOOP RECORDER INSERTION;  Surgeon: Constance Haw, MD;  Location: [facility];  Service: Cardiovascular;  Laterality: N/A;   RIGHT HEART CATH N/A 09/20/2017   Procedure: RIGHT HEART CATH;  Surgeon: Larey Dresser, MD;  Location: [facility];  Service:  Cardiovascular;  Laterality: N/A;   RIGHT/LEFT HEART CATH AND CORONARY ANGIOGRAPHY N/A 03/19/2018   Procedure: RIGHT/LEFT HEART CATH AND CORONARY ANGIOGRAPHY;  Surgeon: Larey Dresser, MD;  Location: [facility];  Service: Cardiovascular;  Laterality: N/A;     reports that she quit smoking about 31 years ago. Her smoking use included cigarettes. She has a 30.00 pack-year smoking history. She has never used smokeless tobacco. She reports that she does not drink alcohol or use drugs.  No Known Allergies  Family History  Problem Relation Age of Onset   Ovarian cancer Mother    Breast cancer Neg Hx    Hyperparathyroidism Neg Hx    Colon cancer Neg Hx    Esophageal cancer Neg Hx    Stomach cancer Neg Hx      Prior to Admission medications   Medication Sig Start Date End Date Taking? Authorizing Provider  albuterol (VENTOLIN HFA) 108 (90 Base) MCG/ACT inhaler INHALE 1-2 PUFFS INTO THE LUNGS EVERY 4 HOURS AS NEEDED FOR WHEEZING OR SHORTNESS OF BREATH. Patient taking differently: Inhale 1-2 puffs into the lungs every 4 (four) hours as needed for wheezing or shortness of breath. INHALE 1-2 PUFFS INTO THE LUNGS EVERY 4 HOURS AS NEEDED FOR WHEEZING OR SHORTNESS OF BREATH. 04/30/18   Parrett, Fonnie Mu, NP  aspirin EC 325 MG EC tablet Take 1 tablet (325 mg total) by mouth daily. 11/09/18   Donzetta Starch, NP  atorvastatin (LIPITOR) 20 MG tablet Take 1 tablet (20 mg total) by mouth at bedtime. 11/19/18   Love, Ivan Anchors, PA-C  bisoprolol (ZEBETA) 5 MG tablet Take 2.5 mg by mouth daily.    [provider]  Blood Glucose Calibration (ACCU-CHEK AVIVA) SOLN USE AS INSTRUCTED TO TEST BLOOD SUGAR DAILY Patient taking differently: 1 each by Other route daily.  05/28/18   [name], NP  Blood Glucose Monitoring Suppl (ACCU-CHEK AVIVA PLUS) w/Device KIT Use as instructed to test blood sugar up to 3 times daily Patient taking differently: 1 each by Other route 3 (three) times daily.   05/28/18   [name], NP  fluticasone furoate-vilanterol (BREO ELLIPTA) 200-25 MCG/INH AEPB Inhale 1 puff into the lungs daily. 11/25/18   Tanda Rockers, MD  furosemide (LASIX) 20 MG tablet Take 20 mg by mouth daily.    [provider]  glucose blood (ACCU-CHEK AVIVA PLUS) test strip Use as instructed to test blood sugar up to 3 times daily 05/28/18   [name], NP  isosorbide-hydrALAZINE (BIDIL) 20-37.5 MG tablet Take 1 tablet by mouth 3 (three) times daily.    [provider]  Lancets (ACCU-CHEK SOFT TOUCH) lancets Use as instructed to test blood sugar up to 3 times daily Patient taking differently: 1 each by Other route 3 (three) times daily.  05/28/18   [name], NP  metoprolol tartrate (LOPRESSOR) 25 MG tablet  Take 0.5 tablets (12.5 mg total) by mouth 2 (two) times daily. 11/19/18   Love, Ivan Anchors, PA-C  mirtazapine (REMERON) 15 MG tablet TAKE 1 TABLET BY MOUTH AT BEDTIME. FOR DEPRESSION AND APPETITE. Patient taking differently: Take 15 mg by mouth at bedtime.  09/25/18   [name], NP  OXYGEN Inhale 4 L into the lungs continuous.     [provider]  potassium chloride (KLOR-CON M10) 10 MEQ tablet Take 1 tablet (10 mEq total) by mouth daily. Patient taking differently: Take 5 mEq by mouth daily.  11/19/18   Love, Ivan Anchors, PA-C  spironolactone (ALDACTONE) 25 MG tablet Take 1 tablet (25 mg total) by mouth daily. Take at night 11/19/18   Love, Pamela S, PA-C  umeclidinium bromide (INCRUSE ELLIPTA) 62.5 MCG/INH AEPB Inhale 1 puff into the lungs daily. 11/20/18   Bary Leriche, PA-C    Physical Exam: Vitals:   11/27/18 1430 11/27/18 1443 11/27/18 1710 11/27/18 1730  BP: (!) 155/105  (!) 180/106 (!) 164/119  Pulse:   88 87  Resp: (!) 27  18 (!) 23  Temp:  99.4 F (37.4 C)    TempSrc:  Rectal    SpO2: 98%  100% 95%  Weight:      Height:        Constitutional: NAD, calm, comfortable Vitals:   11/27/18 1430 11/27/18 1443  11/27/18 1710 11/27/18 1730  BP: (!) 155/105  (!) 180/106 (!) 164/119  Pulse:   88 87  Resp: (!) 27  18 (!) 23  Temp:  99.4 F (37.4 C)    TempSrc:  Rectal    SpO2: 98%  100% 95%  Weight:      Height:       Eyes: PERRL, lids and conjunctivae normal, chronically sick looking lady on 4 L oxygen.  She is cachectic. ENMT: Mucous membranes are moist. Posterior pharynx clear of any exudate or lesions.Normal dentition.  Neck: normal, supple, no masses, no thyromegaly Respiratory: clear to auscultation bilaterally, no wheezing, no crackles. Normal respiratory effort. No accessory muscle use.  Cardiovascular: Regular rate and rhythm, no murmurs / rubs / gallops. No extremity edema. 2+ pedal pulses. No carotid bruits.  Implanted loop recorder in place. Abdomen: no tenderness, no masses palpated. No hepatosplenomegaly. Bowel sounds positive.  Musculoskeletal: no clubbing / cyanosis. No joint deformity upper and lower extremities. Good ROM, no contractures. Normal muscle tone.  Skin: no rashes, lesions, ulcers. No induration Neurologic:  Left facial droop, dysarthria, able to follow commands.   Strength, 3/5 left upper and lower extremity, she can lift against gravity. Strength, 5/5 right upper and lower extremity. Psychiatric: Anxious.    Labs on Admission: I have personally reviewed following labs and imaging studies  CBC: Recent Labs  Lab 11/27/18 1333  WBC 4.0  NEUTROABS 2.5  HGB 13.5  HCT 45.5  MCV 86.8  PLT 638   Basic Metabolic Panel: Recent Labs  Lab 11/27/18 1333  NA 145  K 4.6  CL 104  CO2 33*  GLUCOSE 116*  BUN 21  CREATININE 1.17*  CALCIUM 11.7*   GFR: Estimated Creatinine Clearance: 28.8 mL/min (A) (by C-G formula based on SCr of 1.17 mg/dL (H)). Liver Function Tests: Recent Labs  Lab 11/27/18 1333  AST 34  ALT 18  ALKPHOS 108  BILITOT 1.0  PROT 7.8  ALBUMIN 4.5   No results for input(s): LIPASE, AMYLASE in the last 168 hours. No results for  input(s): AMMONIA in the last 168  hours. Coagulation Profile: No results for input(s): INR, PROTIME in the last 168 hours. Cardiac Enzymes: No results for input(s): CKTOTAL, CKMB, CKMBINDEX, TROPONINI in the last 168 hours. BNP (last 3 results) Recent Labs    03/01/18 1218 05/02/18 1249 06/24/18 1111  PROBNP 1,336.0* 910.0* 840.0*   HbA1C: No results for input(s): HGBA1C in the last 72 hours. CBG: Recent Labs  Lab 11/27/18 1303  GLUCAP 109*   Lipid Profile: No results for input(s): CHOL, HDL, LDLCALC, TRIG, CHOLHDL, LDLDIRECT in the last 72 hours. Thyroid Function Tests: No results for input(s): TSH, T4TOTAL, FREET4, T3FREE, THYROIDAB in the last 72 hours. Anemia Panel: No results for input(s): VITAMINB12, FOLATE, FERRITIN, TIBC, IRON, RETICCTPCT in the last 72 hours. Urine analysis:    Component Value Date/Time   COLORURINE YELLOW 11/27/2018 [place] 11/27/2018 1534   LABSPEC 1.019 11/27/2018 1534   PHURINE 7.0 11/27/2018 1534   GLUCOSEU NEGATIVE 11/27/2018 1534   HGBUR NEGATIVE 11/27/2018 [employer] 11/27/2018 1534   BILIRUBINUR Negative 09/28/2017 [place] 11/27/2018 1534   PROTEINUR 30 (A) 11/27/2018 1534   UROBILINOGEN 0.2 09/28/2017 1604   NITRITE NEGATIVE 11/27/2018 1534   LEUKOCYTESUR NEGATIVE 11/27/2018 1534    Radiological Exams on Admission: Ct Angio Head W Or Wo Contrast  Result Date: 11/27/2018 CLINICAL DATA:  Facial weakness, left arm weakness, subacute cerebellar infarct EXAM: CT ANGIOGRAPHY HEAD AND NECK TECHNIQUE: Multidetector CT imaging of the head and neck was performed using the standard protocol during bolus administration of intravenous contrast. Multiplanar CT image reconstructions and MIPs were obtained to evaluate the vascular anatomy. Carotid stenosis measurements (when applicable) are obtained utilizing NASCET criteria, using the distal internal carotid diameter as the denominator.  CONTRAST:  63m OMNIPAQUE IOHEXOL 350 MG/ML SOLN COMPARISON:  None. FINDINGS: Motion artifact is present. CTA NECK FINDINGS Aortic arch: Aortic arch is minimally imaged. Great vessel origins are patent. Right carotid system: Common, internal, and external carotid arteries are patent. Mild calcified plaque is present at the proximal ICA without measurable stenosis. Left carotid system: Common, internal, and external carotid arteries are patent. Small focus of plaque ulceration at the left ICA origin. There is no measurable stenosis. Vertebral arteries: Dominant right vertebral artery is patent with mild calcified plaque near the origin. Left vertebral artery is patent with calcified plaque causing apparent high-grade stenosis but no evidence of flow limitation. Skeleton: Degenerative changes of the cervical spine. Other neck: Mildly enlarged and heterogeneous thyroid. No neck mass or adenopathy within limitation of artifact. Upper chest: Severe emphysema. Review of the MIP images confirms the above findings CTA HEAD FINDINGS Anterior circulation: Intracranial internal carotid arteries are patent with atherosclerotic irregularity and mild calcified plaque. Anterior and middle cerebral arteries are patent. Posterior circulation: Intracranial vertebral arteries are patent. There is moderate stenosis of the left vertebral artery. Basilar artery is patent. Posterior cerebral arteries are patent. Venous sinuses: As permitted by contrast timing, patent. Anatomic variants: Diminutive bilateral posterior communicating arteries. Review of the MIP images confirms the above findings IMPRESSION: No large vessel occlusion. No significant stenosis at the ICA origins. Plaque at the left vertebral artery origin causing severe stenosis but without evidence of flow limitation. Moderate stenosis of the intracranial left vertebral artery. Electronically Signed   By: PMacy MisM.D.   On: 11/27/2018 15:43   Ct Head Wo  Contrast  Result Date: 11/27/2018 CLINICAL DATA:  Left arm weakness, facial weakness, history of stroke, increased drowsiness EXAM: CT  HEAD WITHOUT CONTRAST TECHNIQUE: Contiguous axial images were obtained from the base of the skull through the vertex without intravenous contrast. COMPARISON:  11/07/2018, MR brain, 11/05/2018 FINDINGS: Brain: There is decreased size and hyperdensity of a subacute hemorrhagic infarction of the right cerebellar hemisphere (series 2, image 7). Decreased size and conspicuity of or a subacute edematous, nonhemorrhagic infarction of the left cerebellar hemisphere (series 2, image 6). Periventricular white matter hypodensity. Redemonstrated encephalomalacia of the left frontal lobe (series 3, image 20). Vascular: No hyperdense vessel or unexpected calcification. Skull: Normal. Negative for fracture or focal lesion. Sinuses/Orbits: No acute finding. Other: None. IMPRESSION: 1. There is decreased size and hyperdensity of a subacute hemorrhagic infarction of the right cerebellar hemisphere (series 2, image 7). 2. Decreased size and conspicuity of or a subacute edematous, nonhemorrhagic infarction of the left cerebellar hemisphere (series 2, image 6). 3.  Nonacute encephalomalacia of the left frontal lobe. 4.  Small-vessel white matter disease. Electronically Signed   By: Eddie Candle M.D.   On: 11/27/2018 15:11   Ct Angio Neck W And/or Wo Contrast  Result Date: 11/27/2018 CLINICAL DATA:  Facial weakness, left arm weakness, subacute cerebellar infarct EXAM: CT ANGIOGRAPHY HEAD AND NECK TECHNIQUE: Multidetector CT imaging of the head and neck was performed using the standard protocol during bolus administration of intravenous contrast. Multiplanar CT image reconstructions and MIPs were obtained to evaluate the vascular anatomy. Carotid stenosis measurements (when applicable) are obtained utilizing NASCET criteria, using the distal internal carotid diameter as the denominator. CONTRAST:   20m OMNIPAQUE IOHEXOL 350 MG/ML SOLN COMPARISON:  None. FINDINGS: Motion artifact is present. CTA NECK FINDINGS Aortic arch: Aortic arch is minimally imaged. Great vessel origins are patent. Right carotid system: Common, internal, and external carotid arteries are patent. Mild calcified plaque is present at the proximal ICA without measurable stenosis. Left carotid system: Common, internal, and external carotid arteries are patent. Small focus of plaque ulceration at the left ICA origin. There is no measurable stenosis. Vertebral arteries: Dominant right vertebral artery is patent with mild calcified plaque near the origin. Left vertebral artery is patent with calcified plaque causing apparent high-grade stenosis but no evidence of flow limitation. Skeleton: Degenerative changes of the cervical spine. Other neck: Mildly enlarged and heterogeneous thyroid. No neck mass or adenopathy within limitation of artifact. Upper chest: Severe emphysema. Review of the MIP images confirms the above findings CTA HEAD FINDINGS Anterior circulation: Intracranial internal carotid arteries are patent with atherosclerotic irregularity and mild calcified plaque. Anterior and middle cerebral arteries are patent. Posterior circulation: Intracranial vertebral arteries are patent. There is moderate stenosis of the left vertebral artery. Basilar artery is patent. Posterior cerebral arteries are patent. Venous sinuses: As permitted by contrast timing, patent. Anatomic variants: Diminutive bilateral posterior communicating arteries. Review of the MIP images confirms the above findings IMPRESSION: No large vessel occlusion. No significant stenosis at the ICA origins. Plaque at the left vertebral artery origin causing severe stenosis but without evidence of flow limitation. Moderate stenosis of the intracranial left vertebral artery. Electronically Signed   By: PMacy MisM.D.   On: 11/27/2018 15:43   Mr Brain Wo Contrast  Result Date:  11/27/2018 CLINICAL DATA:  Muscle weakness (generalized). Additional history provided: Facial weakness, left arm weakness, subacute cerebellar infarct. EXAM: MRI HEAD WITHOUT CONTRAST TECHNIQUE: Multiplanar, multiecho pulse sequences of the brain and surrounding structures were obtained without intravenous contrast. COMPARISON:  CT angiogram head/neck 11/27/2018, noncontrast head CT 11/27/2018, head CT 11/07/2018, MRI/MRA head 11/05/2018 FINDINGS:  Brain: There is an acute infarct centered within the right thalamocapsular junction involving the adjacent white matter tracks superiorly and inferiorly. This measures 1.5 x 1.0 x 1.8 cm (AP x TV x CC). No evidence of acute infarct elsewhere within the brain. There has been expected evolution of a large hemorrhagic subacute right cerebellar infarct. There has also been expected evolution of a moderate-sized nonhemorrhagic subacute left cerebellar infarct. Superimposed bilateral chronic cerebellar lacunar infarcts. Redemonstrated are multiple small hemorrhagic and nonhemorrhagic cortical infarcts within the bilateral cerebral hemispheres. Redemonstrated chronic lacunar infarct within the right thalamus. Background of otherwise mild chronic small vessel ischemic disease Vascular: Flow voids maintained within the proximal large arterial vessels. Skull and upper cervical spine: No focal marrow lesion. Sinuses/Orbits: Visualized orbits demonstrate no acute abnormality. Mild ethmoid sinus mucosal thickening. No significant mastoid effusion. These results were called by telephone at the time of interpretation on 11/27/2018 at 5:21 pm to provider PA Quentin Cornwall, who verbally acknowledged these results. IMPRESSION: 1.5 x 1.0 x 1.8 cm acute infarct centered within the right thalamocapsular junction involving the adjacent white matter tracks superiorly and inferiorly. Expected evolution of subacute large hemorrhagic right cerebellar and moderate-sized nonhemorrhagic left cerebellar  infarcts. Multiple redemonstrated chronic infarcts within the cerebral cortex, right thalamus and bilateral cerebellar hemispheres. Background of mild chronic small vessel ischemic disease. Electronically Signed   By: Kellie Simmering DO   On: 11/27/2018 17:22   Dg Chest Portable 1 View  Result Date: 11/27/2018 CLINICAL DATA:  Upper back pain. Fatigue. EXAM: PORTABLE CHEST 1 VIEW COMPARISON:  Radiograph 10/30/2018. CT 10/29/2018 FINDINGS: Loop recorder over the left chest wall.The cardiomediastinal contours are unchanged. Borderline cardiomegaly with aortic atherosclerosis. Mild chronic interstitial coarsening, emphysema on prior CT. Trace fluid in the right minor fissure with probable small right pleural effusion. No pulmonary edema. No consolidation or pneumothorax. No acute osseous abnormalities are seen. IMPRESSION: 1. Probable small right pleural effusion with fluid minor fissure. 2. Chronic interstitial coarsening, emphysema on prior CT. 3. Stable borderline cardiomegaly with aortic atherosclerosis. Aortic Atherosclerosis (ICD10-I70.0). Electronically Signed   By: Keith Rake M.D.   On: 11/27/2018 14:38    EKG: Independently reviewed.  Sinus rhythm, PACs, prolonged QTC probably due to PACs.  Assessment/Plan Principal Problem:   Acute ischemic right MCA stroke ([name]) Active Problems:   COPD GOLD  II  spirometry, severe Emphysema and 02 dep   Anxiety and depression   Hyperlipidemia   Essential hypertension   Chronic respiratory failure with hypoxia ([name])   GERD (gastroesophageal reflux disease)   ICH (intracerebral hemorrhage) ([name]) s/p tPA administration   Uncontrolled type 2 diabetes mellitus with hyperglycemia ([place])     1.  Acute ischemic right MCA stroke, multiple territory stroke with recent ischemic stroke: Admit to monitored unit because of severity of symptoms.  Will admit to neuro unit at [facility]. Neurochecks and vital signs as per stroke protocol. Patient was not  a TPA and vascular intervention candidate because of recent ischemic stroke. Diet, bedside swallow evaluation before giving diet. Antiplatelets, holding, will be seen by neurologist before restarting antiplatelets. Statin, LDL 91, on Lipitor, resume. Blood pressure goals less than 195 systolic tonight.  Permissive hypertension.  Hold antihypertensives, continue beta-blockers for rate control. Consultations, neurology, speech, PT OT MRI of the brain, done today shows new ischemic strokes and old strokes with evaluation. CTA head neck, done today shows severe stenosis of the vertebral artery origin on the left. Check A1c, recent was 7, not discharged on any  treatment. Recheck 2D echocardiogram, recent echocardiogram was normal, to look for any evidence of source of embolism. Loop recorder was placed on 11/09/2018, may discuss with cardiology to download data. Neurology will decide about long-term anticoagulation/antiplatelet therapy.  2.  COPD with chronic hypoxic failure on 4 L oxygen: Continue oxygen supplement.  Continue bronchodilators and steroid inhalers.  She follows with pulmonology outpatient.  Currently without exacerbation.  3.  Hypertension: Blood pressure goal more than 161W systolic today.  Keep permissive hypertension.  Continue beta-blockers to avoid tachycardia, holding other antihypertensives.  4.  Type 2 diabetes: Recent A1c 7.  Keep on sliding scale insulin in the past.  Currently not on treatment.  We will recheck to see the current level.   5.  Abnormal EKG: Has PACs and prolonged QTC.  Patient is not on any QTC prolongation medications.  Recheck EKG in the morning.  Also check magnesium and phosphorus.   DVT prophylaxis: SCDs Code Status: Full code Family Communication: Husband at the bedside Disposition Plan: Patient with acutely stroke.  Needing inpatient hospitalization.  She will probably need acute inpatient rehab after clinical stabilization. Consults called:  Neurology, stroke by ER Admission status: Inpatient. Patient has severe systemic disease, new stroke with history of recent strokes.  She will need more than 2 midnight inpatient hospitalization.   Barb Merino MD [facility] Pager [phone]

## 2018-11-27 NOTE — ED Triage Notes (Signed)
Patient BIB EMS from home, C/C upper back pain and fatigue. Per patient's sone she is more drowsy than normal, did not get up out of bed. Patient is awake to questioning and then falls back asleep. States she fell yesterday and hurt her back.

## 2018-11-27 NOTE — ED Notes (Signed)
Pt off floor in CT

## 2018-11-28 LAB — GLUCOSE, CAPILLARY
Glucose-Capillary: 110 mg/dL — ABNORMAL HIGH (ref 70–99)
Glucose-Capillary: 116 mg/dL — ABNORMAL HIGH (ref 70–99)
Glucose-Capillary: 121 mg/dL — ABNORMAL HIGH (ref 70–99)
Glucose-Capillary: 127 mg/dL — ABNORMAL HIGH (ref 70–99)
Glucose-Capillary: 139 mg/dL — ABNORMAL HIGH (ref 70–99)
Glucose-Capillary: 190 mg/dL — ABNORMAL HIGH (ref 70–99)

## 2018-11-28 LAB — POTASSIUM: Potassium: 4.5 mmol/L (ref 3.5–5.1)

## 2018-11-28 LAB — HEMOGLOBIN A1C
Hgb A1c MFr Bld: 7 % — ABNORMAL HIGH (ref 4.8–5.6)
Mean Plasma Glucose: 154.2 mg/dL

## 2018-11-28 LAB — MAGNESIUM: Magnesium: 1.9 mg/dL (ref 1.7–2.4)

## 2018-11-28 MED ORDER — ALBUTEROL SULFATE (2.5 MG/3ML) 0.083% IN NEBU
3.0000 mL | INHALATION_SOLUTION | RESPIRATORY_TRACT | Status: DC | PRN
  Administered 2018-12-03: 20:00:00 3 mL via RESPIRATORY_TRACT
  Filled 2018-11-28: qty 3

## 2018-11-28 MED ORDER — CLOPIDOGREL BISULFATE 75 MG PO TABS
75.0000 mg | ORAL_TABLET | Freq: Every day | ORAL | Status: DC
  Administered 2018-11-28 – 2018-12-03 (×5): 75 mg via ORAL
  Filled 2018-11-28 (×6): qty 1

## 2018-11-28 MED ORDER — ASPIRIN EC 81 MG PO TBEC
81.0000 mg | DELAYED_RELEASE_TABLET | Freq: Every day | ORAL | Status: DC
  Administered 2018-11-28: 81 mg via ORAL
  Filled 2018-11-28 (×2): qty 1

## 2018-11-28 NOTE — ED Notes (Signed)
[facility] here to transport patient.

## 2018-11-28 NOTE — Progress Notes (Signed)
STROKE TEAM PROGRESS NOTE   INTERVAL HISTORY I have reviewed history of presenting illness with the patient in details, electronic medical records and imaging films in PACS.  She presented with increased left-sided weakness and sleepiness and MRI scan of the brain shows a new right thalamic subcortical infarct.  She was admitted recently in 3 weeks ago for stroke and got IV TPA with hemorrhagic transformation and had residual left-sided hemiparesis on discharge.  She was found to have cryptogenic stroke and has a loop recorder.  Vitals:   11/28/18 0127 11/28/18 0455 11/28/18 0456 11/28/18 0537  BP: (!) 187/113 (!) 143/87 (!) 143/87 (!) 149/63  Pulse: 92 81 81 60  Resp: _0 Temp: (!) 97.4 F (36.3 C) 98.1 F (36.7 C) 98.1 F (36.7 C) 98.1 F (36.7 C)  TempSrc: Oral Oral Oral Oral  SpO2: (!) 88% 98% 98% 95%  Weight: 43.5 kg     Height: _1  (1.651 m)       CBC:  Recent Labs  Lab 11/27/18 1333  WBC 4.0  NEUTROABS 2.5  HGB 13.5  HCT 45.5  MCV 86.8  PLT 947    Basic Metabolic Panel:  Recent Labs  Lab 11/27/18 1333  NA 145  K 4.6  CL 104  CO2 33*  GLUCOSE 116*  BUN 21  CREATININE 1.17*  CALCIUM 11.7*   Lipid Panel:     Component Value Date/Time   CHOL 170 11/05/2018 0602   TRIG 85 11/05/2018 0602   HDL 62 11/05/2018 0602   CHOLHDL 2.7 11/05/2018 0602   VLDL 17 11/05/2018 0602   LDLCALC 91 11/05/2018 0602   HgbA1c:  Lab Results  Component Value Date   HGBA1C 7.0 (H) 11/05/2018   Urine Drug Screen: No results found for: LABOPIA, COCAINSCRNUR, LABBENZ, AMPHETMU, THCU, LABBARB  Alcohol Level     Component Value Date/Time   ETH <10 11/27/2018 1333    IMAGING Ct Angio Head W Or Wo Contrast  Result Date: 11/27/2018 CLINICAL DATA:  Facial weakness, left arm weakness, subacute cerebellar infarct EXAM: CT ANGIOGRAPHY HEAD AND NECK TECHNIQUE: Multidetector CT imaging of the head and neck was performed using the standard protocol during bolus  administration of intravenous contrast. Multiplanar CT image reconstructions and MIPs were obtained to evaluate the vascular anatomy. Carotid stenosis measurements (when applicable) are obtained utilizing NASCET criteria, using the distal internal carotid diameter as the denominator. CONTRAST:  13m OMNIPAQUE IOHEXOL 350 MG/ML SOLN COMPARISON:  None. FINDINGS: Motion artifact is present. CTA NECK FINDINGS Aortic arch: Aortic arch is minimally imaged. Great vessel origins are patent. Right carotid system: Common, internal, and external carotid arteries are patent. Mild calcified plaque is present at the proximal ICA without measurable stenosis. Left carotid system: Common, internal, and external carotid arteries are patent. Small focus of plaque ulceration at the left ICA origin. There is no measurable stenosis. Vertebral arteries: Dominant right vertebral artery is patent with mild calcified plaque near the origin. Left vertebral artery is patent with calcified plaque causing apparent high-grade stenosis but no evidence of flow limitation. Skeleton: Degenerative changes of the cervical spine. Other neck: Mildly enlarged and heterogeneous thyroid. No neck mass or adenopathy within limitation of artifact. Upper chest: Severe emphysema. Review of the MIP images confirms the above findings CTA HEAD FINDINGS Anterior circulation: Intracranial internal carotid arteries are patent with atherosclerotic irregularity and mild calcified plaque. Anterior and middle cerebral arteries are patent. Posterior circulation: Intracranial vertebral arteries are patent. There is moderate  stenosis of the left vertebral artery. Basilar artery is patent. Posterior cerebral arteries are patent. Venous sinuses: As permitted by contrast timing, patent. Anatomic variants: Diminutive bilateral posterior communicating arteries. Review of the MIP images confirms the above findings IMPRESSION: No large vessel occlusion. No significant stenosis at  the ICA origins. Plaque at the left vertebral artery origin causing severe stenosis but without evidence of flow limitation. Moderate stenosis of the intracranial left vertebral artery. Electronically Signed   By: Macy Mis M.D.   On: 11/27/2018 15:43   Ct Head Wo Contrast  Result Date: 11/27/2018 CLINICAL DATA:  Left arm weakness, facial weakness, history of stroke, increased drowsiness EXAM: CT HEAD WITHOUT CONTRAST TECHNIQUE: Contiguous axial images were obtained from the base of the skull through the vertex without intravenous contrast. COMPARISON:  11/07/2018, MR brain, 11/05/2018 FINDINGS: Brain: There is decreased size and hyperdensity of a subacute hemorrhagic infarction of the right cerebellar hemisphere (series 2, image 7). Decreased size and conspicuity of or a subacute edematous, nonhemorrhagic infarction of the left cerebellar hemisphere (series 2, image 6). Periventricular white matter hypodensity. Redemonstrated encephalomalacia of the left frontal lobe (series 3, image 20). Vascular: No hyperdense vessel or unexpected calcification. Skull: Normal. Negative for fracture or focal lesion. Sinuses/Orbits: No acute finding. Other: None. IMPRESSION: 1. There is decreased size and hyperdensity of a subacute hemorrhagic infarction of the right cerebellar hemisphere (series 2, image 7). 2. Decreased size and conspicuity of or a subacute edematous, nonhemorrhagic infarction of the left cerebellar hemisphere (series 2, image 6). 3.  Nonacute encephalomalacia of the left frontal lobe. 4.  Small-vessel white matter disease. Electronically Signed   By: Eddie Candle M.D.   On: 11/27/2018 15:11   Ct Angio Neck W And/or Wo Contrast  Result Date: 11/27/2018 CLINICAL DATA:  Facial weakness, left arm weakness, subacute cerebellar infarct EXAM: CT ANGIOGRAPHY HEAD AND NECK TECHNIQUE: Multidetector CT imaging of the head and neck was performed using the standard protocol during bolus administration of  intravenous contrast. Multiplanar CT image reconstructions and MIPs were obtained to evaluate the vascular anatomy. Carotid stenosis measurements (when applicable) are obtained utilizing NASCET criteria, using the distal internal carotid diameter as the denominator. CONTRAST:  31m OMNIPAQUE IOHEXOL 350 MG/ML SOLN COMPARISON:  None. FINDINGS: Motion artifact is present. CTA NECK FINDINGS Aortic arch: Aortic arch is minimally imaged. Great vessel origins are patent. Right carotid system: Common, internal, and external carotid arteries are patent. Mild calcified plaque is present at the proximal ICA without measurable stenosis. Left carotid system: Common, internal, and external carotid arteries are patent. Small focus of plaque ulceration at the left ICA origin. There is no measurable stenosis. Vertebral arteries: Dominant right vertebral artery is patent with mild calcified plaque near the origin. Left vertebral artery is patent with calcified plaque causing apparent high-grade stenosis but no evidence of flow limitation. Skeleton: Degenerative changes of the cervical spine. Other neck: Mildly enlarged and heterogeneous thyroid. No neck mass or adenopathy within limitation of artifact. Upper chest: Severe emphysema. Review of the MIP images confirms the above findings CTA HEAD FINDINGS Anterior circulation: Intracranial internal carotid arteries are patent with atherosclerotic irregularity and mild calcified plaque. Anterior and middle cerebral arteries are patent. Posterior circulation: Intracranial vertebral arteries are patent. There is moderate stenosis of the left vertebral artery. Basilar artery is patent. Posterior cerebral arteries are patent. Venous sinuses: As permitted by contrast timing, patent. Anatomic variants: Diminutive bilateral posterior communicating arteries. Review of the MIP images confirms the above findings IMPRESSION:  No large vessel occlusion. No significant stenosis at the ICA origins.  Plaque at the left vertebral artery origin causing severe stenosis but without evidence of flow limitation. Moderate stenosis of the intracranial left vertebral artery. Electronically Signed   By: Macy Mis M.D.   On: 11/27/2018 15:43   Mr Brain Wo Contrast  Result Date: 11/27/2018 CLINICAL DATA:  Muscle weakness (generalized). Additional history provided: Facial weakness, left arm weakness, subacute cerebellar infarct. EXAM: MRI HEAD WITHOUT CONTRAST TECHNIQUE: Multiplanar, multiecho pulse sequences of the brain and surrounding structures were obtained without intravenous contrast. COMPARISON:  CT angiogram head/neck 11/27/2018, noncontrast head CT 11/27/2018, head CT 11/07/2018, MRI/MRA head 11/05/2018 FINDINGS: Brain: There is an acute infarct centered within the right thalamocapsular junction involving the adjacent white matter tracks superiorly and inferiorly. This measures 1.5 x 1.0 x 1.8 cm (AP x TV x CC). No evidence of acute infarct elsewhere within the brain. There has been expected evolution of a large hemorrhagic subacute right cerebellar infarct. There has also been expected evolution of a moderate-sized nonhemorrhagic subacute left cerebellar infarct. Superimposed bilateral chronic cerebellar lacunar infarcts. Redemonstrated are multiple small hemorrhagic and nonhemorrhagic cortical infarcts within the bilateral cerebral hemispheres. Redemonstrated chronic lacunar infarct within the right thalamus. Background of otherwise mild chronic small vessel ischemic disease Vascular: Flow voids maintained within the proximal large arterial vessels. Skull and upper cervical spine: No focal marrow lesion. Sinuses/Orbits: Visualized orbits demonstrate no acute abnormality. Mild ethmoid sinus mucosal thickening. No significant mastoid effusion. These results were called by telephone at the time of interpretation on 11/27/2018 at 5:21 pm to provider PA Quentin Cornwall, who verbally acknowledged these results.  IMPRESSION: 1.5 x 1.0 x 1.8 cm acute infarct centered within the right thalamocapsular junction involving the adjacent white matter tracks superiorly and inferiorly. Expected evolution of subacute large hemorrhagic right cerebellar and moderate-sized nonhemorrhagic left cerebellar infarcts. Multiple redemonstrated chronic infarcts within the cerebral cortex, right thalamus and bilateral cerebellar hemispheres. Background of mild chronic small vessel ischemic disease. Electronically Signed   By: Kellie Simmering DO   On: 11/27/2018 17:22   Dg Chest Portable 1 View  Result Date: 11/27/2018 CLINICAL DATA:  Upper back pain. Fatigue. EXAM: PORTABLE CHEST 1 VIEW COMPARISON:  Radiograph 10/30/2018. CT 10/29/2018 FINDINGS: Loop recorder over the left chest wall.The cardiomediastinal contours are unchanged. Borderline cardiomegaly with aortic atherosclerosis. Mild chronic interstitial coarsening, emphysema on prior CT. Trace fluid in the right minor fissure with probable small right pleural effusion. No pulmonary edema. No consolidation or pneumothorax. No acute osseous abnormalities are seen. IMPRESSION: 1. Probable small right pleural effusion with fluid minor fissure. 2. Chronic interstitial coarsening, emphysema on prior CT. 3. Stable borderline cardiomegaly with aortic atherosclerosis. Aortic Atherosclerosis (ICD10-I70.0). Electronically Signed   By: Keith Rake M.D.   On: 11/27/2018 14:38    PHYSICAL EXAM Pleasant frail elderly African-American lady not in distress. . Afebrile. Head is nontraumatic. Neck is supple without bruit.    Cardiac exam no murmur or gallop. Lungs are clear to auscultation. Distal pulses are well felt. Neurological Exam :  Awake alert oriented to time place and person.  No aphasia apraxia or dysarthria.  Extraocular movements are full range without nystagmus.  She blinks to threat bilaterally.  Mild left lower facial weakness.  Tongue midline.  Motor system exam no upper or lower  extremity drift with mild weakness of left grip, intrinsic hand muscles and orbits right over left upper extremity.  Trace weakness of left hip flexors and ankle dorsiflexors  only.  Sensation is i is diminished to touch and pinprick in the left upper and lower extremity.  Deep tendon reflexes are symmetric.  Plantars downgoing.  Coordination is accurate.  Gait not tested.   ASSESSMENT/PLAN Denise [name] is a 73 y.o. female with history of ype 2 diabetes mellitus, hypertension, COPD, recent stroke  post TPA with right cerebellar hemorrhagic transformation  presenting with L sided weakness and increasing lethargy.  Stroke:   New R thalamic infarct likely d/t small vessel disease in setting of recent L cerebellar, R cerebellar (w/ post tPA hemorrhagic transformation tx w/ TXA) and R MCA infarcts, all mostly likely d/t unknown embolic source, however there is large vessel disease in the L VA that could have caused the L cerebellar infarct   CT head subacute R cerebellar hemorrhagic infarct decreasing in size. Subacute L cerebellar non hemorrhagic infarct decreasing in size. nonacute L frontal lobe encephalomalacia. Small vessel white matter disease.   CTA head & neck no LVO. Severe stenosis L VA origin w/o limiting flow. Moderate stenosis L VA.   MRI  R thalamocapsular infarct. Evolution large R cerebellar and moderate L cerebellar infarcts. Chronic infarcts cerebral cortex, R thalamic and B cerebellar. Small vessel disease. Atrophy.   LDL 91  HgbA1c 7.0  Has loop recorder. Will get cardiology to interrogate to look for atrial fibrillation  SCDs for VTE prophylaxis  aspirin 325 mg daily prior to admission, now on No antithrombotic. Given recurrent stroke and known intracranial stenosis, recommend aspirin 81 mg and plavix 75 mg daily x 3 weeks, then Plavis alone. It has been long enough since hemorrhagic transformation to add DAPT per Dr. Leonie Man.  Orders adjusted.   Therapy  recommendations:  CIR  Disposition:  pending   NO indication for additional stroke testing given recent workup  History of Stroke 11/04/2018 admission  1. Stroke: new interval left cerebellar infarct, could be due to high grade tandem stenosis left VA in the setting of relative low BP  2. Stroke: R cerebellar and R MCA infarcts s/p tPA w/ R cerebellar hemorrhagic transformation (TXA) with new L cerebellar infarct in hospital, infarcts likely embolic secondary to unknown source  Hypertensive Urgency  BP on arrival 187/113 . Permissive hypertension (OK if < 180/100) but gradually normalize in 5-7 days . Long-term BP goal normotensive  Avoid low BP given left VA high grade stenosis  Hyperlipidemia  Home meds:  lipitor 20  LDL 91, goal < 70  Resume lipitor in hospital  Will not give higher intensity statins d/t advanced age  Continue statin at discharge  Diabetes type II Controlled  HgbA1c 7.0, goal < 7.0  CBGs  SSI  Other Stroke Risk Factors  Advanced age  Former Cigarette smoker  Diastolic Congestive heart failure  Other Active Problems  Chronic respiratory failure w/ hypoxia  COPD - remains on O2   Anxiety and depression  Hx colon cancer  Primary hyperparathyroidism   CKD stage III  Severe malnutrition d/t chronic illness (COPD, CHF) with severe fat and muscle depletion. Body mass index is 15.96 kg/m.   Abnormal EKG w/ prolonged QTC and PACs  Hospital day # 1  Patient has presented with right thalamic infarct due to small vessel disease.  She had recent admission 3 weeks ago for cryptogenic strokes and received IV TPA with hemorrhagic transformation.  No need to repeat stroke work-up.  Aspirin Plavix for 3 weeks followed by aspirin alone.  Interrogate loop recorder.  Therapy consults.  Aggressive  risk factor modification.  Follow-up as an outpatient stroke clinic in 6 weeks.  Discussed with Dr. Maylene Roes.  Greater than 50% time during this 25-minute visit  was spent on counseling and coordination of care about her lacunar stroke and answering questions. Antony Contras, MD Medical Director [facility] Pager: [phone] 11/28/2018 12:58 PM To contact Stroke Continuity provider, please refer to [email or web address] After hours, contact General Neurology

## 2018-11-28 NOTE — Evaluation (Signed)
Speech Language Pathology Evaluation Patient Details Name: Denise [name] MRN: [id number] DOB: September 09, 1945 Today's Date: 11/28/2018 Time: 0820-0850 SLP Time Calculation (min) (ACUTE ONLY): 30 min  Problem List:  Patient Active Problem List   Diagnosis Date Noted  . Acute ischemic right MCA stroke ([place]) 11/27/2018  . Vaginal candidiasis   . Acute blood loss anemia   . Supplemental oxygen dependent   . Uncontrolled type 2 diabetes mellitus with hyperglycemia ([place])   . ICH (intracerebral hemorrhage) ([place]) s/p tPA administration 11/08/2018  . Hypertensive urgency 11/08/2018  . Cerebellar stroke, acute ([place]) 11/08/2018  . History of ischemic stroke 11/04/2018  . COPD exacerbation ([place]) 10/28/2018  . Acute on chronic systolic CHF (congestive heart failure) ([place]) 10/28/2018  . Elevated troponin 10/28/2018  . Lactic acidosis 10/28/2018  . GERD (gastroesophageal reflux disease) 10/28/2018  . Acute on chronic respiratory failure with hypercapnia ([place]) 10/28/2018  . Diabetes mellitus without complication-diet controled 10/28/2018  . On home oxygen therapy 07/12/2018  . CHF (congestive heart failure) ([place]) 05/16/2018  . Chronic respiratory failure with hypoxia and hypercapnia ([place]) 05/03/2018  . Borderline abnormal TFTs 05/02/2018  . Type 2 diabetes mellitus ([place]) 04/26/2018  . Unintended weight loss 04/26/2018  . Lower extremity edema 02/20/2018  . Chronic respiratory failure with hypoxia ([place]) 10/02/2017  . Hematuria 09/27/2017  . Pulmonary hypertension ([name]) c/w cor pulmonale   . Protein-calorie malnutrition, severe 09/17/2017  . Constipation   . Hyperparathyroidism, primary ([place]) 07/27/2016  . Loss of appetite 06/02/2016  . COPD GOLD  II  spirometry, severe Emphysema and 02 dep 11/22/2015  . Vitamin D deficiency 11/22/2015  . Anxiety and depression 11/22/2015  . Insomnia 11/22/2015  . Hyperlipidemia 11/22/2015  . Essential hypertension 11/22/2015  . Hx of colonic polyps  02/10/2013  . History of colon cancer 06/11/1986   Past Medical History:  Past Medical History:  Diagnosis Date  . Altered mental status   . Anxiety and depression   . Chronic respiratory failure with hypoxia ([place]) 10/02/2017   Assoc with ? Cor pulmonale dx 09/2017 so rx 02 1-2 lpm 24/7  - 10/17/2017  Walked RA x 3 laps @ 185 ft each stopped due to  End of study, nl pace,  desat to 87 on 3rd lap - 12/19/2017  Saturations on Room Air at Rest =91 % and while Ambulating = 87%  But  on  2 Liters of pulsed oxygen while Ambulating =93% so rec POC 2lpm walking / use at rest if sats under 90%      . Colon cancer ([place]) 1988   Resected  . COPD (chronic obstructive pulmonary disease) ([place])   . Diabetes mellitus without complication ([name])    diet controlled- no meds. per pt  . Diarrhea 04/02/2018  . Diverticulosis   . Hyperlipidemia   . Hypertension   . Hypoxia 10/30/2018  . Insomnia   . Primary hyperparathyroidism ([place])   . SBO (small bowel obstruction) ([place])   . Stroke ([place]) 10/2018   tpa administered  . Tubular adenoma of rectum 02/23/2013   low grade  . Vitamin D deficiency    Past Surgical History:  Past Surgical History:  Procedure Laterality Date  . ABDOMINAL HYSTERECTOMY  08/2015  . BREAST BIOPSY Left   . BREAST EXCISIONAL BIOPSY Right   . COLON RESECTION  1980's  . COLONOSCOPY    . ENDOMETRIAL BIOPSY  2009   negative  . INCONTINENCE SURGERY  2017  . LOOP RECORDER INSERTION N/A 11/08/2018   Procedure:  LOOP RECORDER INSERTION;  Surgeon: Constance Haw, MD;  Location: [place] [facility];  Service: Cardiovascular;  Laterality: N/A;  . RIGHT HEART CATH N/A 09/20/2017   Procedure: RIGHT HEART CATH;  Surgeon: Larey Dresser, MD;  Location: [place] [facility];  Service: Cardiovascular;  Laterality: N/A;  . RIGHT/LEFT HEART CATH AND CORONARY ANGIOGRAPHY N/A 03/19/2018   Procedure: RIGHT/LEFT HEART CATH AND CORONARY ANGIOGRAPHY;  Surgeon: Larey Dresser, MD;  Location: [place]  [facility];  Service: Cardiovascular;  Laterality: N/A;   HPI:  73 y.o. female with past medical history significant for type 2 diabetes mellitus, hypertension, COPD, recent stroke  post TPA (11/2) with right cerebellar hemorrhagic transformation with new right thalamic capsular infarction.  CT head shows no significant changes, redemonstrates left vertebral artery severe stenosis.   Assessment / Plan / Recommendation Clinical Impression  Pt demonstrates cognitive function quite similar to prior assessments. She is alert, needs orientation to date, is able to verbalize general history but not accurate with details or complex information. Short term memory impaired at baseline, relies on husband for assist. Acutely, pt is more short of breath and cannot articulate at phrase length without rests, but is otherwise not dysarthric. Language is WNL, but pt does need repetition of multistep instructions. Visual impairment noted, in naming line drawings and reading large print words. Will continue treatment acutely, but pt may not be far from baseline function.     SLP Assessment  SLP Recommendation/Assessment: Patient needs continued Speech Lanaguage Pathology Services SLP Visit Diagnosis: Cognitive communication deficit (R41.841)    Follow Up Recommendations       Frequency and Duration min 1 x/week  2 weeks      SLP Evaluation Cognition  Overall Cognitive Status: History of cognitive impairments - at baseline Arousal/Alertness: Awake/alert Orientation Level: Oriented to person;Oriented to place;Oriented to situation;Disoriented to time Attention: Focused;Sustained Focused Attention: Appears intact Sustained Attention: Appears intact Sustained Attention Impairment: Verbal basic;Functional basic Memory: Impaired Memory Impairment: Storage deficit;Retrieval deficit Awareness: Impaired Awareness Impairment: Emergent impairment;Intellectual impairment Problem Solving: Impaired Problem Solving  Impairment: Functional basic;Verbal basic Safety/Judgment: Impaired       Comprehension  Auditory Comprehension Overall Auditory Comprehension: Impaired Yes/No Questions: Within Functional Limits Commands: Impaired One Step Basic Commands: 75-100% accurate Two Step Basic Commands: 75-100% accurate Multistep Basic Commands: 0-24% accurate Conversation: Simple Interfering Components: Anxiety;Working memory;Visual impairments EffectiveTechniques: Stage manager Discrimination: Exceptions to [facility] Common Objects: Not tested Pictures: Unable to indentify    Expression Verbal Expression Overall Verbal Expression: Appears within functional limits for tasks assessed Common Objects: Not tested Pictures: Unable to indentify Written Expression Dominant Hand: Right Written Expression: Not tested   Oral / Motor  Oral Motor/Sensory Function Overall Oral Motor/Sensory Function: Mild impairment Facial ROM: Reduced left Facial Symmetry: Abnormal symmetry left Facial Strength: Reduced left Facial Sensation: Within Functional Limits Lingual ROM: Within Functional Limits Lingual Symmetry: Within Functional Limits Lingual Strength: Within Functional Limits Lingual Sensation: Within Functional Limits Velum: Within Functional Limits Mandible: Within Functional Limits Motor Speech Overall Motor Speech: Appears within functional limits for tasks assessed Respiration: Impaired Level of Impairment: Phrase Phonation: Normal Resonance: Within functional limits Articulation: Within functional limitis Intelligibility: Intelligible Motor Planning: Witnin functional limits Motor Speech Errors: Not applicable   GO                   Herbie [place], MA [place] Pager [phone] Office [phone]  Lynann Beaver 11/28/2018, 9:09  AM    

## 2018-11-28 NOTE — Progress Notes (Signed)
Patient receved to the unit via bed. Patient is alert and oriented x 3. Iv in place. No distress noted. Patient is on NPO. Skin assessment done with another nurse. Patient is on telemetry. Patient given instructions about call bell and phone. Bed in low position and call bell in reach.

## 2018-11-28 NOTE — Consult Note (Signed)
Requesting Physician: Dr. Lovena Neighbours    Chief Complaint: Lethargy, increased weakness as well as slurred speech  History obtained from: Patient and Chart    HPI:                                                                                                                                       Denise [name] is a 73 y.o. female with past medical history significant for type 2 diabetes mellitus, hypertension, COPD, recent stroke  post TPA with right cerebellar hemorrhagic transformation presents to the emergency department [facility] for worsening of left-sided weakness as well as increasing lethargy.  An MRI brain was obtained which showed new right thalamic capsular infarct.  Patient was admitted to medicine service and neurology was consulted for further recommendations.  Prior stroke history Patient presented on 11/ 02/2018 with right cerebellar and right MCA infarcts, received IV TPA and developed right cerebellar hemorrhagic transformation.  Etiology of stroke thought to be embolic.  While in hospital developed a new left cerebellar infarct.  Work-up included MR angiogram which showed intracranial atherosclerosis most significant in the left vertebral artery with tandem stenosis.  Echocardiogram showed EF of 60 to 65% with no source of embolus.  Planned to place loop recorder prior to discharge.  Discharged on aspirin 325 mg daily  Date last known well: 11 24-20 tPA Given: No, outside TPA window    Past Medical History:  Diagnosis Date  . Altered mental status   . Anxiety and depression   . Chronic respiratory failure with hypoxia ([place]) 10/02/2017   Assoc with ? Cor pulmonale dx 09/2017 so rx 02 1-2 lpm 24/7  - 10/17/2017  Walked RA x 3 laps @ 185 ft each stopped due to  End of study, nl pace,  desat to 87 on 3rd lap - 12/19/2017  Saturations on Room Air at Rest =91 % and while Ambulating = 87%  But  on  2 Liters of pulsed oxygen while Ambulating =93% so rec POC 2lpm walking / use at  rest if sats under 90%      . Colon cancer ([place]) 1988   Resected  . COPD (chronic obstructive pulmonary disease) ([place])   . Diabetes mellitus without complication ([name])    diet controlled- no meds. per pt  . Diarrhea 04/02/2018  . Diverticulosis   . Hyperlipidemia   . Hypertension   . Hypoxia 10/30/2018  . Insomnia   . Primary hyperparathyroidism ([place])   . SBO (small bowel obstruction) ([place])   . Stroke ([place]) 10/2018   tpa administered  . Tubular adenoma of rectum 02/23/2013   low grade  . Vitamin D deficiency     Past Surgical History:  Procedure Laterality Date  . ABDOMINAL HYSTERECTOMY  08/2015  . BREAST BIOPSY Left   . BREAST EXCISIONAL BIOPSY Right   . COLON RESECTION  1980's  . COLONOSCOPY    .  ENDOMETRIAL BIOPSY  2009   negative  . INCONTINENCE SURGERY  2017  . LOOP RECORDER INSERTION N/A 11/08/2018   Procedure: LOOP RECORDER INSERTION;  Surgeon: Constance Haw, MD;  Location: [facility];  Service: Cardiovascular;  Laterality: N/A;  . RIGHT HEART CATH N/A 09/20/2017   Procedure: RIGHT HEART CATH;  Surgeon: Larey Dresser, MD;  Location: [facility];  Service: Cardiovascular;  Laterality: N/A;  . RIGHT/LEFT HEART CATH AND CORONARY ANGIOGRAPHY N/A 03/19/2018   Procedure: RIGHT/LEFT HEART CATH AND CORONARY ANGIOGRAPHY;  Surgeon: Larey Dresser, MD;  Location: [facility];  Service: Cardiovascular;  Laterality: N/A;    Family History  Problem Relation Age of Onset  . Ovarian cancer Mother   . Breast cancer Neg Hx   . Hyperparathyroidism Neg Hx   . Colon cancer Neg Hx   . Esophageal cancer Neg Hx   . Stomach cancer Neg Hx    Social History:  reports that she quit smoking about 31 years ago. Her smoking use included cigarettes. She has a 30.00 pack-year smoking history. She has never used smokeless tobacco. She reports that she does not drink alcohol or use drugs.  Allergies: No Known Allergies  Medications:                                                                                                                         I reviewed home medications   ROS:                                                                                                                                     14 systems reviewed and negative except above   Examination:                                                                                                      General: Appears well-developed . Psych: Affect appropriate to situation Eyes: No scleral injection HENT: No OP obstrucion  Head: Normocephalic.  Cardiovascular: Normal rate and regular rhythm.  Respiratory: Effort normal and breath sounds normal to anterior ascultation GI: Soft.  No distension. There is no tenderness.  Skin: WDI    Neurological Examination Mental Status: Alert, oriented, thought content appropriate.  Speech fluent without evidence of aphasia. Dysarthria. Able to follow simple commands without difficulty. Cranial Nerves: II: Visual fields grossly normal,  III,IV, VI: ptosis not present, extra-ocular motions intact bilaterally, pupils equal, round, reactive to light and accommodation VII: left facial droop  VIII: hearing normal bilaterally IX,X: uvula rises symmetrically XI: bilateral shoulder shrug XII: midline tongue extension Motor: Right : Upper extremity   5/5    Left:     Upper extremity   4/5  Lower extremity   5/5     Lower extremity   4/5 Tone and bulk:normal tone throughout; no atrophy noted Sensory: mildly reduced sensation to light touch on left arm and leg Deep Tendon Reflexes: 2+ and symmetric throughout Plantars: Right: downgoing   Left: downgoing Cerebellar: dysmetria with finger to nose on left side  Gait: not assessed  Lab Results: Basic Metabolic Panel: Recent Labs  Lab 11/27/18 1333  NA 145  K 4.6  CL 104  CO2 33*  GLUCOSE 116*  BUN 21  CREATININE 1.17*  CALCIUM 11.7*    CBC: Recent Labs  Lab 11/27/18 1333  WBC  4.0  NEUTROABS 2.5  HGB 13.5  HCT 45.5  MCV 86.8  PLT 226    Coagulation Studies: No results for input(s): LABPROT, INR in the last 72 hours.  Imaging: Ct Angio Head W Or Wo Contrast  Result Date: 11/27/2018 CLINICAL DATA:  Facial weakness, left arm weakness, subacute cerebellar infarct EXAM: CT ANGIOGRAPHY HEAD AND NECK TECHNIQUE: Multidetector CT imaging of the head and neck was performed using the standard protocol during bolus administration of intravenous contrast. Multiplanar CT image reconstructions and MIPs were obtained to evaluate the vascular anatomy. Carotid stenosis measurements (when applicable) are obtained utilizing NASCET criteria, using the distal internal carotid diameter as the denominator. CONTRAST:  67m OMNIPAQUE IOHEXOL 350 MG/ML SOLN COMPARISON:  None. FINDINGS: Motion artifact is present. CTA NECK FINDINGS Aortic arch: Aortic arch is minimally imaged. Great vessel origins are patent. Right carotid system: Common, internal, and external carotid arteries are patent. Mild calcified plaque is present at the proximal ICA without measurable stenosis. Left carotid system: Common, internal, and external carotid arteries are patent. Small focus of plaque ulceration at the left ICA origin. There is no measurable stenosis. Vertebral arteries: Dominant right vertebral artery is patent with mild calcified plaque near the origin. Left vertebral artery is patent with calcified plaque causing apparent high-grade stenosis but no evidence of flow limitation. Skeleton: Degenerative changes of the cervical spine. Other neck: Mildly enlarged and heterogeneous thyroid. No neck mass or adenopathy within limitation of artifact. Upper chest: Severe emphysema. Review of the MIP images confirms the above findings CTA HEAD FINDINGS Anterior circulation: Intracranial internal carotid arteries are patent with atherosclerotic irregularity and mild calcified plaque. Anterior and middle cerebral arteries are  patent. Posterior circulation: Intracranial vertebral arteries are patent. There is moderate stenosis of the left vertebral artery. Basilar artery is patent. Posterior cerebral arteries are patent. Venous sinuses: As permitted by contrast timing, patent. Anatomic variants: Diminutive bilateral posterior communicating arteries. Review of the MIP images confirms the above findings IMPRESSION: No large vessel occlusion. No significant stenosis at the ICA origins. Plaque at the left vertebral artery origin causing severe stenosis but without  evidence of flow limitation. Moderate stenosis of the intracranial left vertebral artery. Electronically Signed   By: Macy Mis M.D.   On: 11/27/2018 15:43   Ct Head Wo Contrast  Result Date: 11/27/2018 CLINICAL DATA:  Left arm weakness, facial weakness, history of stroke, increased drowsiness EXAM: CT HEAD WITHOUT CONTRAST TECHNIQUE: Contiguous axial images were obtained from the base of the skull through the vertex without intravenous contrast. COMPARISON:  11/07/2018, MR brain, 11/05/2018 FINDINGS: Brain: There is decreased size and hyperdensity of a subacute hemorrhagic infarction of the right cerebellar hemisphere (series 2, image 7). Decreased size and conspicuity of or a subacute edematous, nonhemorrhagic infarction of the left cerebellar hemisphere (series 2, image 6). Periventricular white matter hypodensity. Redemonstrated encephalomalacia of the left frontal lobe (series 3, image 20). Vascular: No hyperdense vessel or unexpected calcification. Skull: Normal. Negative for fracture or focal lesion. Sinuses/Orbits: No acute finding. Other: None. IMPRESSION: 1. There is decreased size and hyperdensity of a subacute hemorrhagic infarction of the right cerebellar hemisphere (series 2, image 7). 2. Decreased size and conspicuity of or a subacute edematous, nonhemorrhagic infarction of the left cerebellar hemisphere (series 2, image 6). 3.  Nonacute encephalomalacia of  the left frontal lobe. 4.  Small-vessel white matter disease. Electronically Signed   By: Eddie Candle M.D.   On: 11/27/2018 15:11   Ct Angio Neck W And/or Wo Contrast  Result Date: 11/27/2018 CLINICAL DATA:  Facial weakness, left arm weakness, subacute cerebellar infarct EXAM: CT ANGIOGRAPHY HEAD AND NECK TECHNIQUE: Multidetector CT imaging of the head and neck was performed using the standard protocol during bolus administration of intravenous contrast. Multiplanar CT image reconstructions and MIPs were obtained to evaluate the vascular anatomy. Carotid stenosis measurements (when applicable) are obtained utilizing NASCET criteria, using the distal internal carotid diameter as the denominator. CONTRAST:  20m OMNIPAQUE IOHEXOL 350 MG/ML SOLN COMPARISON:  None. FINDINGS: Motion artifact is present. CTA NECK FINDINGS Aortic arch: Aortic arch is minimally imaged. Great vessel origins are patent. Right carotid system: Common, internal, and external carotid arteries are patent. Mild calcified plaque is present at the proximal ICA without measurable stenosis. Left carotid system: Common, internal, and external carotid arteries are patent. Small focus of plaque ulceration at the left ICA origin. There is no measurable stenosis. Vertebral arteries: Dominant right vertebral artery is patent with mild calcified plaque near the origin. Left vertebral artery is patent with calcified plaque causing apparent high-grade stenosis but no evidence of flow limitation. Skeleton: Degenerative changes of the cervical spine. Other neck: Mildly enlarged and heterogeneous thyroid. No neck mass or adenopathy within limitation of artifact. Upper chest: Severe emphysema. Review of the MIP images confirms the above findings CTA HEAD FINDINGS Anterior circulation: Intracranial internal carotid arteries are patent with atherosclerotic irregularity and mild calcified plaque. Anterior and middle cerebral arteries are patent. Posterior  circulation: Intracranial vertebral arteries are patent. There is moderate stenosis of the left vertebral artery. Basilar artery is patent. Posterior cerebral arteries are patent. Venous sinuses: As permitted by contrast timing, patent. Anatomic variants: Diminutive bilateral posterior communicating arteries. Review of the MIP images confirms the above findings IMPRESSION: No large vessel occlusion. No significant stenosis at the ICA origins. Plaque at the left vertebral artery origin causing severe stenosis but without evidence of flow limitation. Moderate stenosis of the intracranial left vertebral artery. Electronically Signed   By: PMacy MisM.D.   On: 11/27/2018 15:43   Mr Brain Wo Contrast  Result Date: 11/27/2018 CLINICAL DATA:  Muscle  weakness (generalized). Additional history provided: Facial weakness, left arm weakness, subacute cerebellar infarct. EXAM: MRI HEAD WITHOUT CONTRAST TECHNIQUE: Multiplanar, multiecho pulse sequences of the brain and surrounding structures were obtained without intravenous contrast. COMPARISON:  CT angiogram head/neck 11/27/2018, noncontrast head CT 11/27/2018, head CT 11/07/2018, MRI/MRA head 11/05/2018 FINDINGS: Brain: There is an acute infarct centered within the right thalamocapsular junction involving the adjacent white matter tracks superiorly and inferiorly. This measures 1.5 x 1.0 x 1.8 cm (AP x TV x CC). No evidence of acute infarct elsewhere within the brain. There has been expected evolution of a large hemorrhagic subacute right cerebellar infarct. There has also been expected evolution of a moderate-sized nonhemorrhagic subacute left cerebellar infarct. Superimposed bilateral chronic cerebellar lacunar infarcts. Redemonstrated are multiple small hemorrhagic and nonhemorrhagic cortical infarcts within the bilateral cerebral hemispheres. Redemonstrated chronic lacunar infarct within the right thalamus. Background of otherwise mild chronic small vessel  ischemic disease Vascular: Flow voids maintained within the proximal large arterial vessels. Skull and upper cervical spine: No focal marrow lesion. Sinuses/Orbits: Visualized orbits demonstrate no acute abnormality. Mild ethmoid sinus mucosal thickening. No significant mastoid effusion. These results were called by telephone at the time of interpretation on 11/27/2018 at 5:21 pm to provider PA Quentin Cornwall, who verbally acknowledged these results. IMPRESSION: 1.5 x 1.0 x 1.8 cm acute infarct centered within the right thalamocapsular junction involving the adjacent white matter tracks superiorly and inferiorly. Expected evolution of subacute large hemorrhagic right cerebellar and moderate-sized nonhemorrhagic left cerebellar infarcts. Multiple redemonstrated chronic infarcts within the cerebral cortex, right thalamus and bilateral cerebellar hemispheres. Background of mild chronic small vessel ischemic disease. Electronically Signed   By: Kellie Simmering DO   On: 11/27/2018 17:22   Dg Chest Portable 1 View  Result Date: 11/27/2018 CLINICAL DATA:  Upper back pain. Fatigue. EXAM: PORTABLE CHEST 1 VIEW COMPARISON:  Radiograph 10/30/2018. CT 10/29/2018 FINDINGS: Loop recorder over the left chest wall.The cardiomediastinal contours are unchanged. Borderline cardiomegaly with aortic atherosclerosis. Mild chronic interstitial coarsening, emphysema on prior CT. Trace fluid in the right minor fissure with probable small right pleural effusion. No pulmonary edema. No consolidation or pneumothorax. No acute osseous abnormalities are seen. IMPRESSION: 1. Probable small right pleural effusion with fluid minor fissure. 2. Chronic interstitial coarsening, emphysema on prior CT. 3. Stable borderline cardiomegaly with aortic atherosclerosis. Aortic Atherosclerosis (ICD10-I70.0). Electronically Signed   By: Keith Rake M.D.   On: 11/27/2018 14:38     ASSESSMENT AND PLAN  73 y.o. female with past medical history significant  for type 2 diabetes mellitus, hypertension, COPD, recent stroke  post TPA with right cerebellar hemorrhagic transformation with new right thalamic capsular infarction.  CT head shows no significant changes, redemonstrates left vertebral artery severe stenosis.   Acute right thalamic capsular infarction Hypertensive emergency  Recommendations Continue aspirin 361m daily  Blood pressure goal less than 1732systolic No need to repeat further stroke evaluation, echocardiogram recently performed. Interrogate loop monitor  Swallow eval before starting diet PT/OT eval   Stroke team to follow and make further recommendations   Amond Speranza [facility] Pager Number [phone]

## 2018-11-28 NOTE — Progress Notes (Signed)
Rehab Admissions Coordinator Note:  Patient was screened by Michel Santee for appropriateness for an Inpatient Acute Rehab Consult.  At this time, we are recommending Inpatient Rehab consult.  I will place a CIR consult order in order to better assess pt for candidacy.   Michel Santee 11/28/2018, 11:09 AM  I can be reached at [phone].

## 2018-11-28 NOTE — Progress Notes (Signed)
PROGRESS NOTE    BUENA BOEHM  FSE:[id number] DOB: October 18, 1945 DOA: 11/27/2018 PCP: [name], NP     Brief Narrative:  Denise [name] is a 73 y.o. female with medical history significant of type 2 diabetes not on treatment, hypertension, COPD with chronic hypoxic respiratory failure on 4 L oxygen, pulmonary hypertension who was recently admitted to the hospital with acute ischemic stroke, status post TPA with hemorrhagic conversion treated with cryoprecipitate and tranexamic acid, went to acute inpatient rehab and discharged home on 11/19/2018 comes back to the emergency room with her husband with more than usual weakness on the left side.  According to the husband at bedside, when she left the rehab, she was able to eat by herself, she was able to use her left hand.  She still had some weakness, however she was able to function and walk with support.  This morning, her husband found her hard to arouse, found with increasing speech difficulty and worsening left-sided weakness.  Patient also complaining of some shoulder pain and aching.  She has some dysarthria.  She uses 4 L oxygen at baseline and has no worsening shortness of breath or wheezing. MRI showed new right thalamocapsular stroke with evaluating old hemorrhagic strokes.  CTA with no acute occlusion, severe stenosis left vertebral artery origin.  Neurology consulted.  New events last 24 hours / Subjective: Complains of some shortness of breath, requires 4L [place] O2 at baseline. Continues to have left sided weakness.   Assessment & Plan:   Principal Problem:   Acute ischemic right MCA stroke ([name]) Active Problems:   COPD GOLD  II  spirometry, severe Emphysema and 02 dep   Anxiety and depression   Hyperlipidemia   Essential hypertension   Chronic respiratory failure with hypoxia ([name])   GERD (gastroesophageal reflux disease)   ICH (intracerebral hemorrhage) ([name]) s/p tPA administration   Uncontrolled type 2  diabetes mellitus with hyperglycemia ([name])   Acute ischemic right thalamic stroke, multiple territory stroke with recent ischemic stroke -MRI brain acute infarct centered within the right thalamocapsular junction  -CTA head/neck no large vessel occlusion. No significant stenosis at the ICA origins -Neurology consulted -Continue aspirin, plavix  -PT OT SLP   COPD with chronic hypoxic failure on 4 L oxygen at baseline -Currently without exacerbation -Continue nasal cannula O2  Essential hypertension -Permissive hypertension   Type 2 diabetes -Recent A1c 7 -Continue sliding scale insulin  Prolonged QTc -Repeat EKG. Check Mg, K    DVT prophylaxis: SCD Code Status: Full Family Communication: No family at bedside Disposition Plan: CIR evaluation pending.  She was just discharged from [facility]/2018    Consultants:   Neurology   Antimicrobials:  Anti-infectives (From admission, onward)   None        Objective: Vitals:   11/28/18 0455 11/28/18 0456 11/28/18 0537 11/28/18 0923  BP: (!) 143/87 (!) 143/87 (!) 149/63 (!) 149/91  Pulse: 81 81 60   Resp: _0 (!) 21  Temp: 98.1 F (36.7 C) 98.1 F (36.7 C) 98.1 F (36.7 C) 98.2 F (36.8 C)  TempSrc: Oral Oral Oral Oral  SpO2: 98% 98% 95% 93%  Weight:      Height:        Intake/Output Summary (Last 24 hours) at 11/28/2018 1145 Last data filed at 11/28/2018 0610 Gross per 24 hour  Intake --  Output 100 ml  Net -100 ml   Filed Weights   11/27/18 1241 11/28/18 0127  Weight: 42.6  kg 43.5 kg    Examination:  General exam: Appears calm and comfortable  Respiratory system: Clear to auscultation. Respiratory effort normal. No respiratory distress. No conversational dyspnea. On [place] O2  Cardiovascular system: S1 & S2 heard, Irreg rhythm, PACs on telemetry. No murmurs. No pedal edema. Gastrointestinal system: Abdomen is nondistended, soft and nontender. Normal bowel sounds heard. Central nervous system:  Alert and oriented. +left facial droop, +LUE weaker compared to RUE  Extremities: Symmetric in appearance  Skin: No rashes, lesions or ulcers on exposed skin  Psychiatry: Judgement and insight appear normal. Mood & affect appropriate.   Data Reviewed: I have personally reviewed following labs and imaging studies  CBC: Recent Labs  Lab 11/27/18 1333  WBC 4.0  NEUTROABS 2.5  HGB 13.5  HCT 45.5  MCV 86.8  PLT 157   Basic Metabolic Panel: Recent Labs  Lab 11/27/18 1333  NA 145  K 4.6  CL 104  CO2 33*  GLUCOSE 116*  BUN 21  CREATININE 1.17*  CALCIUM 11.7*   GFR: Estimated Creatinine Clearance: 29.4 mL/min (A) (by C-G formula based on SCr of 1.17 mg/dL (H)). Liver Function Tests: Recent Labs  Lab 11/27/18 1333  AST 34  ALT 18  ALKPHOS 108  BILITOT 1.0  PROT 7.8  ALBUMIN 4.5   No results for input(s): LIPASE, AMYLASE in the last 168 hours. No results for input(s): AMMONIA in the last 168 hours. Coagulation Profile: No results for input(s): INR, PROTIME in the last 168 hours. Cardiac Enzymes: No results for input(s): CKTOTAL, CKMB, CKMBINDEX, TROPONINI in the last 168 hours. BNP (last 3 results) Recent Labs    03/01/18 1218 05/02/18 1249 06/24/18 1111  PROBNP 1,336.0* 910.0* 840.0*   HbA1C: No results for input(s): HGBA1C in the last 72 hours. CBG: Recent Labs  Lab 11/27/18 1303 11/27/18 2324 11/28/18 0145 11/28/18 0750  GLUCAP 109* 103* 139* 110*   Lipid Profile: No results for input(s): CHOL, HDL, LDLCALC, TRIG, CHOLHDL, LDLDIRECT in the last 72 hours. Thyroid Function Tests: No results for input(s): TSH, T4TOTAL, FREET4, T3FREE, THYROIDAB in the last 72 hours. Anemia Panel: No results for input(s): VITAMINB12, FOLATE, FERRITIN, TIBC, IRON, RETICCTPCT in the last 72 hours. Sepsis Labs: No results for input(s): PROCALCITON, LATICACIDVEN in the last 168 hours.  Recent Results (from the past 240 hour(s))  SARS CORONAVIRUS 2 (TAT 6-24 HRS)  Nasopharyngeal Nasopharyngeal Swab     Status: None   Collection Time: 11/27/18  2:47 PM   Specimen: Nasopharyngeal Swab  Result Value Ref Range Status   SARS Coronavirus 2 NEGATIVE NEGATIVE Final    Comment: (NOTE) SARS-CoV-2 target nucleic acids are NOT DETECTED. The SARS-CoV-2 RNA is generally detectable in upper and lower respiratory specimens during the acute phase of infection. Negative results do not preclude SARS-CoV-2 infection, do not rule out co-infections with other pathogens, and should not be used as the sole basis for treatment or other patient management decisions. Negative results must be combined with clinical observations, patient history, and epidemiological information. The expected result is Negative. Fact Sheet for Patients: SugarRoll.be Fact Sheet for Healthcare Providers: https://www.woods-mathews.com/ This test is not yet approved or cleared by the [place] FDA and  has been authorized for detection and/or diagnosis of SARS-CoV-2 by FDA under an Emergency Use Authorization (EUA). This EUA will remain  in effect (meaning this test can be used) for the duration of the COVID-19 declaration under Section 56 4(b)(1) of the Act, 21 U.S.C. section 360bbb-3(b)(1), unless the authorization is  terminated or revoked sooner. Performed at [facility], [place] [address], [place], [place] [address]       Radiology Studies: Ct Angio Head W Or Wo Contrast  Result Date: 11/27/2018 CLINICAL DATA:  Facial weakness, left arm weakness, subacute cerebellar infarct EXAM: CT ANGIOGRAPHY HEAD AND NECK TECHNIQUE: Multidetector CT imaging of the head and neck was performed using the standard protocol during bolus administration of intravenous contrast. Multiplanar CT image reconstructions and MIPs were obtained to evaluate the vascular anatomy. Carotid stenosis measurements (when applicable) are obtained utilizing NASCET criteria,  using the distal internal carotid diameter as the denominator. CONTRAST:  46m OMNIPAQUE IOHEXOL 350 MG/ML SOLN COMPARISON:  None. FINDINGS: Motion artifact is present. CTA NECK FINDINGS Aortic arch: Aortic arch is minimally imaged. Great vessel origins are patent. Right carotid system: Common, internal, and external carotid arteries are patent. Mild calcified plaque is present at the proximal ICA without measurable stenosis. Left carotid system: Common, internal, and external carotid arteries are patent. Small focus of plaque ulceration at the left ICA origin. There is no measurable stenosis. Vertebral arteries: Dominant right vertebral artery is patent with mild calcified plaque near the origin. Left vertebral artery is patent with calcified plaque causing apparent high-grade stenosis but no evidence of flow limitation. Skeleton: Degenerative changes of the cervical spine. Other neck: Mildly enlarged and heterogeneous thyroid. No neck mass or adenopathy within limitation of artifact. Upper chest: Severe emphysema. Review of the MIP images confirms the above findings CTA HEAD FINDINGS Anterior circulation: Intracranial internal carotid arteries are patent with atherosclerotic irregularity and mild calcified plaque. Anterior and middle cerebral arteries are patent. Posterior circulation: Intracranial vertebral arteries are patent. There is moderate stenosis of the left vertebral artery. Basilar artery is patent. Posterior cerebral arteries are patent. Venous sinuses: As permitted by contrast timing, patent. Anatomic variants: Diminutive bilateral posterior communicating arteries. Review of the MIP images confirms the above findings IMPRESSION: No large vessel occlusion. No significant stenosis at the ICA origins. Plaque at the left vertebral artery origin causing severe stenosis but without evidence of flow limitation. Moderate stenosis of the intracranial left vertebral artery. Electronically Signed   By: PMacy MisM.D.   On: 11/27/2018 15:43   Ct Head Wo Contrast  Result Date: 11/27/2018 CLINICAL DATA:  Left arm weakness, facial weakness, history of stroke, increased drowsiness EXAM: CT HEAD WITHOUT CONTRAST TECHNIQUE: Contiguous axial images were obtained from the base of the skull through the vertex without intravenous contrast. COMPARISON:  11/07/2018, MR brain, 11/05/2018 FINDINGS: Brain: There is decreased size and hyperdensity of a subacute hemorrhagic infarction of the right cerebellar hemisphere (series 2, image 7). Decreased size and conspicuity of or a subacute edematous, nonhemorrhagic infarction of the left cerebellar hemisphere (series 2, image 6). Periventricular white matter hypodensity. Redemonstrated encephalomalacia of the left frontal lobe (series 3, image 20). Vascular: No hyperdense vessel or unexpected calcification. Skull: Normal. Negative for fracture or focal lesion. Sinuses/Orbits: No acute finding. Other: None. IMPRESSION: 1. There is decreased size and hyperdensity of a subacute hemorrhagic infarction of the right cerebellar hemisphere (series 2, image 7). 2. Decreased size and conspicuity of or a subacute edematous, nonhemorrhagic infarction of the left cerebellar hemisphere (series 2, image 6). 3.  Nonacute encephalomalacia of the left frontal lobe. 4.  Small-vessel white matter disease. Electronically Signed   By: AEddie CandleM.D.   On: 11/27/2018 15:11   Ct Angio Neck W And/or Wo Contrast  Result Date: 11/27/2018 CLINICAL DATA:  Facial  weakness, left arm weakness, subacute cerebellar infarct EXAM: CT ANGIOGRAPHY HEAD AND NECK TECHNIQUE: Multidetector CT imaging of the head and neck was performed using the standard protocol during bolus administration of intravenous contrast. Multiplanar CT image reconstructions and MIPs were obtained to evaluate the vascular anatomy. Carotid stenosis measurements (when applicable) are obtained utilizing NASCET criteria, using the distal  internal carotid diameter as the denominator. CONTRAST:  23m OMNIPAQUE IOHEXOL 350 MG/ML SOLN COMPARISON:  None. FINDINGS: Motion artifact is present. CTA NECK FINDINGS Aortic arch: Aortic arch is minimally imaged. Great vessel origins are patent. Right carotid system: Common, internal, and external carotid arteries are patent. Mild calcified plaque is present at the proximal ICA without measurable stenosis. Left carotid system: Common, internal, and external carotid arteries are patent. Small focus of plaque ulceration at the left ICA origin. There is no measurable stenosis. Vertebral arteries: Dominant right vertebral artery is patent with mild calcified plaque near the origin. Left vertebral artery is patent with calcified plaque causing apparent high-grade stenosis but no evidence of flow limitation. Skeleton: Degenerative changes of the cervical spine. Other neck: Mildly enlarged and heterogeneous thyroid. No neck mass or adenopathy within limitation of artifact. Upper chest: Severe emphysema. Review of the MIP images confirms the above findings CTA HEAD FINDINGS Anterior circulation: Intracranial internal carotid arteries are patent with atherosclerotic irregularity and mild calcified plaque. Anterior and middle cerebral arteries are patent. Posterior circulation: Intracranial vertebral arteries are patent. There is moderate stenosis of the left vertebral artery. Basilar artery is patent. Posterior cerebral arteries are patent. Venous sinuses: As permitted by contrast timing, patent. Anatomic variants: Diminutive bilateral posterior communicating arteries. Review of the MIP images confirms the above findings IMPRESSION: No large vessel occlusion. No significant stenosis at the ICA origins. Plaque at the left vertebral artery origin causing severe stenosis but without evidence of flow limitation. Moderate stenosis of the intracranial left vertebral artery. Electronically Signed   By: PMacy MisM.D.   On:  11/27/2018 15:43   Mr Brain Wo Contrast  Result Date: 11/27/2018 CLINICAL DATA:  Muscle weakness (generalized). Additional history provided: Facial weakness, left arm weakness, subacute cerebellar infarct. EXAM: MRI HEAD WITHOUT CONTRAST TECHNIQUE: Multiplanar, multiecho pulse sequences of the brain and surrounding structures were obtained without intravenous contrast. COMPARISON:  CT angiogram head/neck 11/27/2018, noncontrast head CT 11/27/2018, head CT 11/07/2018, MRI/MRA head 11/05/2018 FINDINGS: Brain: There is an acute infarct centered within the right thalamocapsular junction involving the adjacent white matter tracks superiorly and inferiorly. This measures 1.5 x 1.0 x 1.8 cm (AP x TV x CC). No evidence of acute infarct elsewhere within the brain. There has been expected evolution of a large hemorrhagic subacute right cerebellar infarct. There has also been expected evolution of a moderate-sized nonhemorrhagic subacute left cerebellar infarct. Superimposed bilateral chronic cerebellar lacunar infarcts. Redemonstrated are multiple small hemorrhagic and nonhemorrhagic cortical infarcts within the bilateral cerebral hemispheres. Redemonstrated chronic lacunar infarct within the right thalamus. Background of otherwise mild chronic small vessel ischemic disease Vascular: Flow voids maintained within the proximal large arterial vessels. Skull and upper cervical spine: No focal marrow lesion. Sinuses/Orbits: Visualized orbits demonstrate no acute abnormality. Mild ethmoid sinus mucosal thickening. No significant mastoid effusion. These results were called by telephone at the time of interpretation on 11/27/2018 at 5:21 pm to provider PA RQuentin Cornwall who verbally acknowledged these results. IMPRESSION: 1.5 x 1.0 x 1.8 cm acute infarct centered within the right thalamocapsular junction involving the adjacent white matter tracks superiorly and inferiorly. Expected evolution of  subacute large hemorrhagic right  cerebellar and moderate-sized nonhemorrhagic left cerebellar infarcts. Multiple redemonstrated chronic infarcts within the cerebral cortex, right thalamus and bilateral cerebellar hemispheres. Background of mild chronic small vessel ischemic disease. Electronically Signed   By: Kellie Simmering DO   On: 11/27/2018 17:22   Dg Chest Portable 1 View  Result Date: 11/27/2018 CLINICAL DATA:  Upper back pain. Fatigue. EXAM: PORTABLE CHEST 1 VIEW COMPARISON:  Radiograph 10/30/2018. CT 10/29/2018 FINDINGS: Loop recorder over the left chest wall.The cardiomediastinal contours are unchanged. Borderline cardiomegaly with aortic atherosclerosis. Mild chronic interstitial coarsening, emphysema on prior CT. Trace fluid in the right minor fissure with probable small right pleural effusion. No pulmonary edema. No consolidation or pneumothorax. No acute osseous abnormalities are seen. IMPRESSION: 1. Probable small right pleural effusion with fluid minor fissure. 2. Chronic interstitial coarsening, emphysema on prior CT. 3. Stable borderline cardiomegaly with aortic atherosclerosis. Aortic Atherosclerosis (ICD10-I70.0). Electronically Signed   By: Keith Rake M.D.   On: 11/27/2018 14:38      Scheduled Meds:  aspirin EC  81 mg Oral Daily   clopidogrel  75 mg Oral Daily   fluticasone furoate-vilanterol  1 puff Inhalation Daily   furosemide  20 mg Oral Daily   insulin aspart  0-5 Units Subcutaneous QHS   insulin aspart  0-9 Units Subcutaneous TID WC   metoprolol tartrate  12.5 mg Oral BID   mirtazapine  15 mg Oral QHS   spironolactone  25 mg Oral Daily   umeclidinium bromide  1 puff Inhalation Daily   Continuous Infusions:   LOS: 1 day      Time spent: 40 minutes   Dessa Phi, DO [facility] 11/28/2018, 11:45 AM   Available via Epic secure chat 7am-7pm After these hours, please refer to coverage provider listed on [email or web address]

## 2018-11-28 NOTE — Evaluation (Signed)
Clinical/Bedside Swallow Evaluation Patient Details  Name: Denise [name] MRN: [id number] Date of Birth: 28-Feb-1945  Today's Date: 11/28/2018 Time: SLP Start Time (ACUTE ONLY): 0820 SLP Stop Time (ACUTE ONLY): 0850 SLP Time Calculation (min) (ACUTE ONLY): 30 min  Past Medical History:  Past Medical History:  Diagnosis Date  . Altered mental status   . Anxiety and depression   . Chronic respiratory failure with hypoxia ([place]) 10/02/2017   Assoc with ? Cor pulmonale dx 09/2017 so rx 02 1-2 lpm 24/7  - 10/17/2017  Walked RA x 3 laps @ 185 ft each stopped due to  End of study, nl pace,  desat to 87 on 3rd lap - 12/19/2017  Saturations on Room Air at Rest =91 % and while Ambulating = 87%  But  on  2 Liters of pulsed oxygen while Ambulating =93% so rec POC 2lpm walking / use at rest if sats under 90%      . Colon cancer ([place]) 1988   Resected  . COPD (chronic obstructive pulmonary disease) ([place])   . Diabetes mellitus without complication ([name])    diet controlled- no meds. per pt  . Diarrhea 04/02/2018  . Diverticulosis   . Hyperlipidemia   . Hypertension   . Hypoxia 10/30/2018  . Insomnia   . Primary hyperparathyroidism ([place])   . SBO (small bowel obstruction) ([place])   . Stroke ([place]) 10/2018   tpa administered  . Tubular adenoma of rectum 02/23/2013   low grade  . Vitamin D deficiency    Past Surgical History:  Past Surgical History:  Procedure Laterality Date  . ABDOMINAL HYSTERECTOMY  08/2015  . BREAST BIOPSY Left   . BREAST EXCISIONAL BIOPSY Right   . COLON RESECTION  1980's  . COLONOSCOPY    . ENDOMETRIAL BIOPSY  2009   negative  . INCONTINENCE SURGERY  2017  . LOOP RECORDER INSERTION N/A 11/08/2018   Procedure: LOOP RECORDER INSERTION;  Surgeon: Constance Haw, MD;  Location: [facility];  Service: Cardiovascular;  Laterality: N/A;  . RIGHT HEART CATH N/A 09/20/2017   Procedure: RIGHT HEART CATH;  Surgeon: Larey Dresser, MD;  Location: [facility];   Service: Cardiovascular;  Laterality: N/A;  . RIGHT/LEFT HEART CATH AND CORONARY ANGIOGRAPHY N/A 03/19/2018   Procedure: RIGHT/LEFT HEART CATH AND CORONARY ANGIOGRAPHY;  Surgeon: Larey Dresser, MD;  Location: [facility];  Service: Cardiovascular;  Laterality: N/A;   HPI:  73 y.o. female with past medical history significant for type 2 diabetes mellitus, hypertension, COPD, recent stroke  post TPA (11/2) with right cerebellar hemorrhagic transformation with new right thalamic capsular infarction.  CT head shows no significant changes, redemonstrates left vertebral artery severe stenosis.   Assessment / Plan / Recommendation Clinical Impression  Pt reports difficulty swallowing prior to admission. States she has not been able to masticate due to mouth sensitivity, relies on a liquid diet and has significant weight loss. She also reports frequent choking and an inability to vomit appropriately. She is visibly short of breath even on supplemental oxygen. She required oral care prior to PO given clear secretions pt was not swallowing (though able). Pt was able to demonstrate adequate function with ice and puree, but when taking thin liquids in succession she had some delayed weak coughing. Pt needs instrumental swallow assessment, though she may be allowed to take small single sips of water or thin liquids and bites of puree in the meantime. Will f/u [facility] as soon  as can be scheduled.  SLP Visit Diagnosis: Dysphagia, unspecified (R13.10)    Aspiration Risk  Moderate aspiration risk;Risk for inadequate nutrition/hydration    Diet Recommendation Dysphagia 1 (Puree);Thin liquid   Liquid Administration via: Cup;Straw Medication Administration: Crushed with puree Supervision: Staff to assist with self feeding;Full supervision/cueing for compensatory strategies Compensations: Slow rate;Small sips/bites Postural Changes: Seated upright at 90 degrees    Other  Recommendations Oral Care  Recommendations: Oral care BID   Follow up Recommendations        Frequency and Duration            Prognosis Prognosis for Safe Diet Advancement: Fair      Swallow Study   General HPI: 73 y.o. female with past medical history significant for type 2 diabetes mellitus, hypertension, COPD, recent stroke  post TPA (11/2) with right cerebellar hemorrhagic transformation with new right thalamic capsular infarction.  CT head shows no significant changes, redemonstrates left vertebral artery severe stenosis. Type of Study: Bedside Swallow Evaluation Previous Swallow Assessment: none Diet Prior to this Study: NPO Temperature Spikes Noted: No Respiratory Status: Nasal cannula History of Recent Intubation: No Behavior/Cognition: Alert;Cooperative;Pleasant mood Oral Cavity Assessment: Excessive secretions Oral Care Completed by SLP: Yes Oral Cavity - Dentition: Edentulous Vision: Functional for self-feeding Self-Feeding Abilities: Able to feed self Patient Positioning: Upright in bed Baseline Vocal Quality: Normal Volitional Cough: Weak Volitional Swallow: Able to elicit    Oral/Motor/Sensory Function Overall Oral Motor/Sensory Function: Mild impairment Facial ROM: Reduced left Facial Symmetry: Abnormal symmetry left Facial Strength: Reduced left Facial Sensation: Within Functional Limits Lingual ROM: Within Functional Limits Lingual Symmetry: Within Functional Limits Lingual Strength: Within Functional Limits Lingual Sensation: Within Functional Limits Velum: Within Functional Limits Mandible: Within Functional Limits   Ice Chips Ice chips: Within functional limits Presentation: Spoon   Thin Liquid Thin Liquid: Impaired Presentation: Cup;Straw Pharyngeal  Phase Impairments: Cough - Delayed    Nectar Thick Nectar Thick Liquid: Not tested   Honey Thick Honey Thick Liquid: Not tested   Puree Puree: Within functional limits Presentation: Spoon   Solid     Solid: Not tested      Denise Baltimore, MA CCC-SLP  Acute Rehabilitation Services Pager [phone] Office [phone]  Lynann Beaver 11/28/2018,8:58 AM

## 2018-11-28 NOTE — Evaluation (Signed)
Physical Therapy Evaluation Patient Details Name: Denise [name] MRN: [id number] DOB: 01/05/1945 Today's Date: 11/28/2018   History of Present Illness  Pt adm with increasing lt sided weakness and lethargy. Pt found to have new right thalamic capsular infarct. Pt with recent (11/2)  R cerebellar infarct s/p tPA w/ hemorrhagic transformation. PMH - DM, HTN, colon cancer, COPD, chronic respiratory failure with hypoxia, systolic CHF,  Clinical Impression  Pt presents to PT with decr mobility due to incr left sided weakness after new CVA as well as significant limitations due to dyspnea. At this time recommend another CIR stay. If pt's pulmonary and mobility status improve quickly this DC plan may need to be adjusted.     Follow Up Recommendations CIR    Equipment Recommendations  None recommended by PT    Recommendations for Other Services Rehab consult     Precautions / Restrictions Precautions Precautions: Fall Precaution Comments: monitor SpO2 sats (on 4L of O2 via nasal cannula baseline)  Restrictions Weight Bearing Restrictions: No      Mobility  Bed Mobility Overal bed mobility: Needs Assistance Bed Mobility: Supine to Sit     Supine to sit: Min guard;HOB elevated     General bed mobility comments: Assist for lines/safety  Transfers Overall transfer level: Needs assistance Equipment used: 1 person hand held assist Transfers: Sit to/from [facility] Sit to Stand: Min assist Stand pivot transfers: Mod assist       General transfer comment: Assist to bring hips up and for balance. Pt with stand pivot bed to bsc with small pivotal steps  Ambulation/Gait Ambulation/Gait assistance: Mod assist Gait Distance (Feet): 3 Feet Assistive device: 1 person hand held assist Gait Pattern/deviations: Step-to pattern;Decreased step length - right;Decreased step length - left;Shuffle Gait velocity: decr Gait velocity interpretation: <1.8 ft/sec, indicate  of risk for recurrent falls General Gait Details: Assist for balance and support. Amb short distance from bsc to recliner. Pt limited by dyspnea. Pt on 5L of O2 and SpO2 86-88% with limited mobility  Stairs            Wheelchair Mobility    Modified Rankin (Stroke Patients Only) Modified Rankin (Stroke Patients Only) Pre-Morbid Rankin Score: Moderately severe disability Modified Rankin: Moderately severe disability     Balance Overall balance assessment: Needs assistance Sitting-balance support: No upper extremity supported;Feet supported Sitting balance-Leahy Scale: Fair     Standing balance support: Single extremity supported Standing balance-Leahy Scale: Poor Standing balance comment: UE support and min assist for static standing                             Pertinent Vitals/Pain Pain Assessment: No/denies pain    Home Living Family/patient expects to be discharged to:: Private residence Living Arrangements: Spouse/significant other Available Help at Discharge: Family;Available 24 hours/day Type of Home: House Home Access: Stairs to enter Entrance Stairs-Rails: Psychiatric nurse of Steps: 4 Home Layout: One level Home Equipment: Walker - 2 wheels;Tub bench Additional Comments: Home O2 @ 4L. Information from recent admission    Prior Function Level of Independence: Needs assistance   Gait / Transfers Assistance Needed: supervision for amb without assistive device           Hand Dominance   Dominant Hand: Right    Extremity/Trunk Assessment   Upper Extremity Assessment Upper Extremity Assessment: Defer to OT evaluation    Lower Extremity Assessment Lower Extremity Assessment: LLE deficits/detail LLE Deficits /  Details: grossly 4/5    Cervical / Trunk Assessment Cervical / Trunk Assessment: Kyphotic  Communication   Communication: No difficulties  Cognition Arousal/Alertness: Awake/alert Behavior During Therapy: WFL  for tasks assessed/performed Overall Cognitive Status: History of cognitive impairments - at baseline                                        General Comments      Exercises     Assessment/Plan    PT Assessment Patient needs continued PT services  PT Problem List Decreased strength;Decreased activity tolerance;Decreased balance;Decreased mobility;Cardiopulmonary status limiting activity       PT Treatment Interventions DME instruction;Gait training;Stair training;Functional mobility training;Therapeutic activities;Therapeutic exercise;Balance training;Neuromuscular re-education;Cognitive remediation;Patient/family education    PT Goals (Current goals can be found in the Care Plan section)  Acute Rehab PT Goals Patient Stated Goal: not stated PT Goal Formulation: With patient/family Time For Goal Achievement: 12/12/18 Potential to Achieve Goals: Good    Frequency Min 4X/week   Barriers to discharge Inaccessible home environment stairs to enter    Co-evaluation               AM-PAC PT "6 Clicks" Mobility  Outcome Measure Help needed turning from your back to your side while in a flat bed without using bedrails?: A Little Help needed moving from lying on your back to sitting on the side of a flat bed without using bedrails?: A Little Help needed moving to and from a bed to a chair (including a wheelchair)?: A Lot Help needed standing up from a chair using your arms (e.g., wheelchair or bedside chair)?: A Little Help needed to walk in hospital room?: A Lot Help needed climbing 3-5 steps with a railing? : Total 6 Click Score: 14    End of Session Equipment Utilized During Treatment: Oxygen Activity Tolerance: Treatment limited secondary to medical complications (Comment);Patient limited by fatigue(dyspnea) Patient left: in chair;with call bell/phone within reach Nurse Communication: Mobility status;Other (comment)(SpO2) PT Visit Diagnosis: Hemiplegia  and hemiparesis;Other abnormalities of gait and mobility (R26.89) Hemiplegia - Right/Left: Left Hemiplegia - dominant/non-dominant: Non-dominant Hemiplegia - caused by: Cerebral infarction    Time: 3462-1947 PT Time Calculation (min) (ACUTE ONLY): 25 min   Charges:   PT Evaluation $PT Eval Moderate Complexity: 1 Mod PT Treatments $Therapeutic Activity: 8-22 mins        [place] Pager [phone] Office [place] 11/28/2018, 10:25 AM

## 2018-11-29 ENCOUNTER — Inpatient Hospital Stay (HOSPITAL_COMMUNITY): Payer: Medicare HMO

## 2018-11-29 DIAGNOSIS — I63511 Cerebral infarction due to unspecified occlusion or stenosis of right middle cerebral artery: Principal | ICD-10-CM

## 2018-11-29 LAB — BASIC METABOLIC PANEL
Anion gap: 11 (ref 5–15)
BUN: 25 mg/dL — ABNORMAL HIGH (ref 8–23)
CO2: 29 mmol/L (ref 22–32)
Calcium: 11.7 mg/dL — ABNORMAL HIGH (ref 8.9–10.3)
Chloride: 106 mmol/L (ref 98–111)
Creatinine, Ser: 1.16 mg/dL — ABNORMAL HIGH (ref 0.44–1.00)
GFR calc Af Amer: 54 mL/min — ABNORMAL LOW (ref >60)
GFR calc non Af Amer: 47 mL/min — ABNORMAL LOW (ref >60)
Glucose, Bld: 148 mg/dL — ABNORMAL HIGH (ref 70–99)
Potassium: 4.2 mmol/L (ref 3.5–5.1)
Sodium: 146 mmol/L — ABNORMAL HIGH (ref 135–145)

## 2018-11-29 LAB — GLUCOSE, CAPILLARY
Glucose-Capillary: 144 mg/dL — ABNORMAL HIGH (ref 70–99)
Glucose-Capillary: 120 mg/dL — ABNORMAL HIGH (ref 70–99)
Glucose-Capillary: 224 mg/dL — ABNORMAL HIGH (ref 70–99)
Glucose-Capillary: 255 mg/dL — ABNORMAL HIGH (ref 70–99)

## 2018-11-29 MED ORDER — METOPROLOL TARTRATE 5 MG/5ML IV SOLN
2.5000 mg | Freq: Four times a day (QID) | INTRAVENOUS | Status: DC
  Administered 2018-11-29 – 2018-12-02 (×12): 2.5 mg via INTRAVENOUS
  Filled 2018-11-29 (×11): qty 5

## 2018-11-29 MED ORDER — ASPIRIN 300 MG RE SUPP
300.0000 mg | Freq: Every day | RECTAL | Status: DC
  Administered 2018-11-29 – 2018-12-03 (×5): 300 mg via RECTAL
  Filled 2018-11-29 (×3): qty 1

## 2018-11-29 MED ORDER — RESOURCE THICKENUP CLEAR PO POWD
Freq: Once | ORAL | Status: DC
  Filled 2018-11-29 (×2): qty 125

## 2018-11-29 NOTE — Progress Notes (Signed)
   Vital Signs MEWS/VS Documentation      11/29/2018 0700 11/29/2018 0820 11/29/2018 0838 11/29/2018 1334   MEWS Score:  0  0  0  2   MEWS Score Color:  Green  Green  Green  Yellow   Resp:  -  -  18  -   Pulse:  -  -  76  74   BP:  -  -  -  (!) 154/99   O2 Device:  -  Nasal Cannula  Room Air  -   Level of Consciousness:  -  Alert  -  Responds to Pain      - Blood pressure slightly elevated and patient has been drowsy since AM which is not new for the patient. Page Dr. Maylene Roes and made aware, awaiting for orders. - will cont. to monitor.     Denise [name] Denise [name] 11/29/2018,1:50 PM

## 2018-11-29 NOTE — Consult Note (Signed)
Physical Medicine and Rehabilitation Consult  Reason for Consult: Functional deficits Referring Physician: Dr. Maylene Roes   HPI: LEYAN BRANDEN is a 73 y.o. female with history of T2DM, HTN, COPD with chronic SOB/4 liter oxygen dependent, STM deficits, recent admission to CIR after stroke with hemorrhagic conversion. She was discharged to home at supervision level on 11/19/18 and readmitted on 11/27/18 after found with decrease in LOC with speech difficulty and worsening of left sided weakness. CTA head/neck was negative for LVO and showed plaque at L-VA with severe stenosis. MRI brain showed acute infract in right thalamocapsular junction involving adjacent white matter tracks and expected evolution of subacute large hemorrhagic right cerebellar infarct with moderate left cerebellar infarcts.  Dr. Leonie Man felt that stroke likely due to small vessel disease but L-VA could have caused L-cerebellar infarct. Loop recorder to be interrogated by cardiology and she was cleared to start DAPT X 3 weeks followed by Plavix alone.  Therapy evaluations completed showing limitations due to dyspnea with weakness. Started on dysphagia 1, thin liquids pending MBS. CIR recommended due to worsening of neurological symptoms.     ROS  Unable to obtain secondary to lethargy  Past Medical History:  Diagnosis Date   Altered mental status    Anxiety and depression    Chronic respiratory failure with hypoxia ([place]) 10/02/2017   Assoc with ? Cor pulmonale dx 09/2017 so rx 02 1-2 lpm 24/7  - 10/17/2017  Walked RA x 3 laps @ 185 ft each stopped due to  End of study, nl pace,  desat to 87 on 3rd lap - 12/19/2017  Saturations on Room Air at Rest =91 % and while Ambulating = 87%  But  on  2 Liters of pulsed oxygen while Ambulating =93% so rec POC 2lpm walking / use at rest if sats under 90%       Colon cancer ([place]) 1988   Resected   COPD (chronic obstructive pulmonary disease) ([place])    Diabetes mellitus without  complication ([place])    diet controlled- no meds. per pt   Diarrhea 04/02/2018   Diverticulosis    Hyperlipidemia    Hypertension    Hypoxia 10/30/2018   Insomnia    Primary hyperparathyroidism ([place])    SBO (small bowel obstruction) ([place])    Stroke ([place]) 10/2018   tpa administered   Tubular adenoma of rectum 02/23/2013   low grade   Vitamin D deficiency     Past Surgical History:  Procedure Laterality Date   ABDOMINAL HYSTERECTOMY  08/2015   BREAST BIOPSY Left    BREAST EXCISIONAL BIOPSY Right    COLON RESECTION  1980's   COLONOSCOPY     ENDOMETRIAL BIOPSY  2009   negative   INCONTINENCE SURGERY  2017   LOOP RECORDER INSERTION N/A 11/08/2018   Procedure: LOOP RECORDER INSERTION;  Surgeon: Constance Haw, MD;  Location: [facility];  Service: Cardiovascular;  Laterality: N/A;   RIGHT HEART CATH N/A 09/20/2017   Procedure: RIGHT HEART CATH;  Surgeon: Larey Dresser, MD;  Location: [facility];  Service: Cardiovascular;  Laterality: N/A;   RIGHT/LEFT HEART CATH AND CORONARY ANGIOGRAPHY N/A 03/19/2018   Procedure: RIGHT/LEFT HEART CATH AND CORONARY ANGIOGRAPHY;  Surgeon: Larey Dresser, MD;  Location: [facility];  Service: Cardiovascular;  Laterality: N/A;    Family History  Problem Relation Age of Onset   Ovarian cancer Mother    Breast cancer Neg Hx  Hyperparathyroidism Neg Hx    Colon cancer Neg Hx    Esophageal cancer Neg Hx    Stomach cancer Neg Hx     Social History:  reports that she quit smoking about 31 years ago. Her smoking use included cigarettes. She has a 30.00 pack-year smoking history. She has never used smokeless tobacco. She reports that she does not drink alcohol or use drugs.    Allergies: No Known Allergies    Medications Prior to Admission  Medication Sig Dispense Refill   albuterol (VENTOLIN HFA) 108 (90 Base) MCG/ACT inhaler INHALE 1-2 PUFFS INTO THE LUNGS EVERY 4 HOURS AS NEEDED FOR  WHEEZING OR SHORTNESS OF BREATH. (Patient taking differently: Inhale 1-2 puffs into the lungs every 4 (four) hours as needed for wheezing or shortness of breath. INHALE 1-2 PUFFS INTO THE LUNGS EVERY 4 HOURS AS NEEDED FOR WHEEZING OR SHORTNESS OF BREATH.) 6.7 g 5   aspirin EC 325 MG EC tablet Take 1 tablet (325 mg total) by mouth daily. 30 tablet 0   atorvastatin (LIPITOR) 20 MG tablet Take 1 tablet (20 mg total) by mouth at bedtime. 30 tablet 0   bisoprolol (ZEBETA) 5 MG tablet Take 2.5 mg by mouth daily.     Blood Glucose Calibration (ACCU-CHEK AVIVA) SOLN USE AS INSTRUCTED TO TEST BLOOD SUGAR DAILY (Patient taking differently: 1 each by Other route daily. ) 1 each 2   Blood Glucose Monitoring Suppl (ACCU-CHEK AVIVA PLUS) w/Device KIT Use as instructed to test blood sugar up to 3 times daily (Patient taking differently: 1 each by Other route 3 (three) times daily. ) 1 kit 0   fluticasone furoate-vilanterol (BREO ELLIPTA) 200-25 MCG/INH AEPB Inhale 1 puff into the lungs daily. 14 each 0   furosemide (LASIX) 20 MG tablet Take 40 mg by mouth daily.      glucose blood (ACCU-CHEK AVIVA PLUS) test strip Use as instructed to test blood sugar up to 3 times daily 300 each 1   isosorbide-hydrALAZINE (BIDIL) 20-37.5 MG tablet Take 1 tablet by mouth 3 (three) times daily.     Lancets (ACCU-CHEK SOFT TOUCH) lancets Use as instructed to test blood sugar up to 3 times daily (Patient taking differently: 1 each by Other route 3 (three) times daily. ) 300 each 1   metoprolol tartrate (LOPRESSOR) 25 MG tablet Take 0.5 tablets (12.5 mg total) by mouth 2 (two) times daily. 30 tablet 0   mirtazapine (REMERON) 15 MG tablet TAKE 1 TABLET BY MOUTH AT BEDTIME. FOR DEPRESSION AND APPETITE. (Patient taking differently: Take 15 mg by mouth at bedtime. ) 90 tablet 3   OXYGEN Inhale 4 L into the lungs continuous.      potassium chloride (KLOR-CON M10) 10 MEQ tablet Take 1 tablet (10 mEq total) by mouth daily.  (Patient taking differently: Take 5 mEq by mouth daily. ) 30 tablet 0   spironolactone (ALDACTONE) 25 MG tablet Take 1 tablet (25 mg total) by mouth daily. Take at night 30 tablet 0   umeclidinium bromide (INCRUSE ELLIPTA) 62.5 MCG/INH AEPB Inhale 1 puff into the lungs daily.      Home: Home Living Family/patient expects to be discharged to:: Private residence Living Arrangements: Spouse/significant other Available Help at Discharge: Family, Available 24 hours/day Type of Home: House Home Access: Stairs to enter [employer] of Steps: 4 Entrance Stairs-Rails: Right, Left Home Layout: One level Bathroom Shower/Tub: Tub/shower unit, Architectural technologist: Standard Bathroom Accessibility: Yes Home Equipment: Walker - 2 wheels, Tub bench Additional  Comments: Home O2 @ 4L. Information from recent admission  Lives With: Spouse  Functional History: Prior Function Level of Independence: Needs assistance Gait / Transfers Assistance Needed: supervision for amb without assistive device Functional Status:  Mobility: Bed Mobility Overal bed mobility: Needs Assistance Bed Mobility: Supine to Sit Supine to sit: Min guard, HOB elevated General bed mobility comments: Assist for lines/safety Transfers Overall transfer level: Needs assistance Equipment used: 1 person hand held assist Transfers: Sit to/from Stand, Stand Pivot Transfers Sit to Stand: Min assist Stand pivot transfers: Mod assist General transfer comment: Assist to bring hips up and for balance. Pt with stand pivot bed to bsc with small pivotal steps Ambulation/Gait Ambulation/Gait assistance: Mod assist Gait Distance (Feet): 3 Feet Assistive device: 1 person hand held assist Gait Pattern/deviations: Step-to pattern, Decreased step length - right, Decreased step length - left, Shuffle General Gait Details: Assist for balance and support. Amb short distance from bsc to recliner. Pt limited by dyspnea. Pt on 5L of O2  and SpO2 86-88% with limited mobility Gait velocity: decr Gait velocity interpretation: <1.8 ft/sec, indicate of risk for recurrent falls    ADL:    Cognition: Cognition Overall Cognitive Status: History of cognitive impairments - at baseline Arousal/Alertness: Awake/alert Orientation Level: Oriented to person, Oriented to place Attention: Focused, Sustained Focused Attention: Appears intact Sustained Attention: Appears intact Sustained Attention Impairment: Verbal basic, Functional basic Memory: Impaired Memory Impairment: Storage deficit, Retrieval deficit Awareness: Impaired Awareness Impairment: Emergent impairment, Intellectual impairment Problem Solving: Impaired Problem Solving Impairment: Functional basic, Verbal basic Safety/Judgment: Impaired Cognition Arousal/Alertness: Awake/alert Behavior During Therapy: WFL for tasks assessed/performed Overall Cognitive Status: History of cognitive impairments - at baseline   Blood pressure (!) 163/104, pulse 76, temperature 97.7 F (36.5 C), resp. rate 18, height 5' 5" (1.651 m), weight 43.5 kg, SpO2 98 %. Physical Exam  Nursing note and vitals reviewed. Constitutional: She appears well-developed and well-nourished. She appears lethargic.  HENT:  Head: Normocephalic and atraumatic.  Eyes: Pupils are equal, round, and reactive to light. Conjunctivae and EOM are normal.  Neck: Normal range of motion.  Cardiovascular: Normal rate, regular rhythm and normal heart sounds.  Respiratory: Effort normal and breath sounds normal. No respiratory distress.  GI: Soft. Bowel sounds are normal.  Neurological: She appears lethargic.  Minimal verbal output.  Attempts to follow simple commands such as manual muscle testing but has difficulty.,  Catalepsy noted. Gait not tested due to lethargy Motor strength is difficult to assess secondary to cooperation she does have antigravity movement on the right side however the left side is not  antigravity. Sensation is difficult to assess but appears reduced on the left side versus right side  Skin: Skin is warm and dry.  Psychiatric: Her affect is blunt. She is slowed. Cognition and memory are impaired.    Results for orders placed or performed during the hospital encounter of 11/27/18 (from the past 24 hour(s))  Glucose, capillary     Status: Abnormal   Collection Time: 11/28/18 11:46 AM  Result Value Ref Range   Glucose-Capillary 127 (H) 70 - 99 mg/dL  Magnesium     Status: None   Collection Time: 11/28/18 12:37 PM  Result Value Ref Range   Magnesium 1.9 1.7 - 2.4 mg/dL  Potassium     Status: None   Collection Time: 11/28/18 12:37 PM  Result Value Ref Range   Potassium 4.5 3.5 - 5.1 mmol/L  Glucose, capillary     Status: Abnormal   Collection  Time: 11/28/18  5:03 PM  Result Value Ref Range   Glucose-Capillary 190 (H) 70 - 99 mg/dL  Glucose, capillary     Status: Abnormal   Collection Time: 11/28/18  9:30 PM  Result Value Ref Range   Glucose-Capillary 116 (H) 70 - 99 mg/dL  Glucose, capillary     Status: Abnormal   Collection Time: 11/28/18 11:30 PM  Result Value Ref Range   Glucose-Capillary 121 (H) 70 - 99 mg/dL  BMP tmrw AM     Status: Abnormal   Collection Time: 11/29/18  7:47 AM  Result Value Ref Range   Sodium 146 (H) 135 - 145 mmol/L   Potassium 4.2 3.5 - 5.1 mmol/L   Chloride 106 98 - 111 mmol/L   CO2 29 22 - 32 mmol/L   Glucose, Bld 148 (H) 70 - 99 mg/dL   BUN 25 (H) 8 - 23 mg/dL   Creatinine, Ser 1.16 (H) 0.44 - 1.00 mg/dL   Calcium 11.7 (H) 8.9 - 10.3 mg/dL   GFR calc non Af Amer 47 (L) >60 mL/min   GFR calc Af Amer 54 (L) >60 mL/min   Anion gap 11 5 - 15  Glucose, capillary     Status: Abnormal   Collection Time: 11/29/18  7:49 AM  Result Value Ref Range   Glucose-Capillary 144 (H) 70 - 99 mg/dL   Ct Angio Head W Or Wo Contrast  Result Date: 11/27/2018 CLINICAL DATA:  Facial weakness, left arm weakness, subacute cerebellar infarct EXAM: CT  ANGIOGRAPHY HEAD AND NECK TECHNIQUE: Multidetector CT imaging of the head and neck was performed using the standard protocol during bolus administration of intravenous contrast. Multiplanar CT image reconstructions and MIPs were obtained to evaluate the vascular anatomy. Carotid stenosis measurements (when applicable) are obtained utilizing NASCET criteria, using the distal internal carotid diameter as the denominator. CONTRAST:  12m OMNIPAQUE IOHEXOL 350 MG/ML SOLN COMPARISON:  None. FINDINGS: Motion artifact is present. CTA NECK FINDINGS Aortic arch: Aortic arch is minimally imaged. Great vessel origins are patent. Right carotid system: Common, internal, and external carotid arteries are patent. Mild calcified plaque is present at the proximal ICA without measurable stenosis. Left carotid system: Common, internal, and external carotid arteries are patent. Small focus of plaque ulceration at the left ICA origin. There is no measurable stenosis. Vertebral arteries: Dominant right vertebral artery is patent with mild calcified plaque near the origin. Left vertebral artery is patent with calcified plaque causing apparent high-grade stenosis but no evidence of flow limitation. Skeleton: Degenerative changes of the cervical spine. Other neck: Mildly enlarged and heterogeneous thyroid. No neck mass or adenopathy within limitation of artifact. Upper chest: Severe emphysema. Review of the MIP images confirms the above findings CTA HEAD FINDINGS Anterior circulation: Intracranial internal carotid arteries are patent with atherosclerotic irregularity and mild calcified plaque. Anterior and middle cerebral arteries are patent. Posterior circulation: Intracranial vertebral arteries are patent. There is moderate stenosis of the left vertebral artery. Basilar artery is patent. Posterior cerebral arteries are patent. Venous sinuses: As permitted by contrast timing, patent. Anatomic variants: Diminutive bilateral posterior  communicating arteries. Review of the MIP images confirms the above findings IMPRESSION: No large vessel occlusion. No significant stenosis at the ICA origins. Plaque at the left vertebral artery origin causing severe stenosis but without evidence of flow limitation. Moderate stenosis of the intracranial left vertebral artery. Electronically Signed   By: PMacy MisM.D.   On: 11/27/2018 15:43   Ct Head Wo Contrast  Result  Date: 11/27/2018 CLINICAL DATA:  Left arm weakness, facial weakness, history of stroke, increased drowsiness EXAM: CT HEAD WITHOUT CONTRAST TECHNIQUE: Contiguous axial images were obtained from the base of the skull through the vertex without intravenous contrast. COMPARISON:  11/07/2018, MR brain, 11/05/2018 FINDINGS: Brain: There is decreased size and hyperdensity of a subacute hemorrhagic infarction of the right cerebellar hemisphere (series 2, image 7). Decreased size and conspicuity of or a subacute edematous, nonhemorrhagic infarction of the left cerebellar hemisphere (series 2, image 6). Periventricular white matter hypodensity. Redemonstrated encephalomalacia of the left frontal lobe (series 3, image 20). Vascular: No hyperdense vessel or unexpected calcification. Skull: Normal. Negative for fracture or focal lesion. Sinuses/Orbits: No acute finding. Other: None. IMPRESSION: 1. There is decreased size and hyperdensity of a subacute hemorrhagic infarction of the right cerebellar hemisphere (series 2, image 7). 2. Decreased size and conspicuity of or a subacute edematous, nonhemorrhagic infarction of the left cerebellar hemisphere (series 2, image 6). 3.  Nonacute encephalomalacia of the left frontal lobe. 4.  Small-vessel white matter disease. Electronically Signed   By: Eddie Candle M.D.   On: 11/27/2018 15:11   Ct Angio Neck W And/or Wo Contrast  Result Date: 11/27/2018 CLINICAL DATA:  Facial weakness, left arm weakness, subacute cerebellar infarct EXAM: CT ANGIOGRAPHY HEAD  AND NECK TECHNIQUE: Multidetector CT imaging of the head and neck was performed using the standard protocol during bolus administration of intravenous contrast. Multiplanar CT image reconstructions and MIPs were obtained to evaluate the vascular anatomy. Carotid stenosis measurements (when applicable) are obtained utilizing NASCET criteria, using the distal internal carotid diameter as the denominator. CONTRAST:  22m OMNIPAQUE IOHEXOL 350 MG/ML SOLN COMPARISON:  None. FINDINGS: Motion artifact is present. CTA NECK FINDINGS Aortic arch: Aortic arch is minimally imaged. Great vessel origins are patent. Right carotid system: Common, internal, and external carotid arteries are patent. Mild calcified plaque is present at the proximal ICA without measurable stenosis. Left carotid system: Common, internal, and external carotid arteries are patent. Small focus of plaque ulceration at the left ICA origin. There is no measurable stenosis. Vertebral arteries: Dominant right vertebral artery is patent with mild calcified plaque near the origin. Left vertebral artery is patent with calcified plaque causing apparent high-grade stenosis but no evidence of flow limitation. Skeleton: Degenerative changes of the cervical spine. Other neck: Mildly enlarged and heterogeneous thyroid. No neck mass or adenopathy within limitation of artifact. Upper chest: Severe emphysema. Review of the MIP images confirms the above findings CTA HEAD FINDINGS Anterior circulation: Intracranial internal carotid arteries are patent with atherosclerotic irregularity and mild calcified plaque. Anterior and middle cerebral arteries are patent. Posterior circulation: Intracranial vertebral arteries are patent. There is moderate stenosis of the left vertebral artery. Basilar artery is patent. Posterior cerebral arteries are patent. Venous sinuses: As permitted by contrast timing, patent. Anatomic variants: Diminutive bilateral posterior communicating arteries.  Review of the MIP images confirms the above findings IMPRESSION: No large vessel occlusion. No significant stenosis at the ICA origins. Plaque at the left vertebral artery origin causing severe stenosis but without evidence of flow limitation. Moderate stenosis of the intracranial left vertebral artery. Electronically Signed   By: PMacy MisM.D.   On: 11/27/2018 15:43   Mr Brain Wo Contrast  Result Date: 11/27/2018 CLINICAL DATA:  Muscle weakness (generalized). Additional history provided: Facial weakness, left arm weakness, subacute cerebellar infarct. EXAM: MRI HEAD WITHOUT CONTRAST TECHNIQUE: Multiplanar, multiecho pulse sequences of the brain and surrounding structures were obtained without intravenous contrast.  COMPARISON:  CT angiogram head/neck 11/27/2018, noncontrast head CT 11/27/2018, head CT 11/07/2018, MRI/MRA head 11/05/2018 FINDINGS: Brain: There is an acute infarct centered within the right thalamocapsular junction involving the adjacent white matter tracks superiorly and inferiorly. This measures 1.5 x 1.0 x 1.8 cm (AP x TV x CC). No evidence of acute infarct elsewhere within the brain. There has been expected evolution of a large hemorrhagic subacute right cerebellar infarct. There has also been expected evolution of a moderate-sized nonhemorrhagic subacute left cerebellar infarct. Superimposed bilateral chronic cerebellar lacunar infarcts. Redemonstrated are multiple small hemorrhagic and nonhemorrhagic cortical infarcts within the bilateral cerebral hemispheres. Redemonstrated chronic lacunar infarct within the right thalamus. Background of otherwise mild chronic small vessel ischemic disease Vascular: Flow voids maintained within the proximal large arterial vessels. Skull and upper cervical spine: No focal marrow lesion. Sinuses/Orbits: Visualized orbits demonstrate no acute abnormality. Mild ethmoid sinus mucosal thickening. No significant mastoid effusion. These results were called by  telephone at the time of interpretation on 11/27/2018 at 5:21 pm to provider PA Quentin Cornwall, who verbally acknowledged these results. IMPRESSION: 1.5 x 1.0 x 1.8 cm acute infarct centered within the right thalamocapsular junction involving the adjacent white matter tracks superiorly and inferiorly. Expected evolution of subacute large hemorrhagic right cerebellar and moderate-sized nonhemorrhagic left cerebellar infarcts. Multiple redemonstrated chronic infarcts within the cerebral cortex, right thalamus and bilateral cerebellar hemispheres. Background of mild chronic small vessel ischemic disease. Electronically Signed   By: Kellie Simmering DO   On: 11/27/2018 17:22   Dg Chest Portable 1 View  Result Date: 11/27/2018 CLINICAL DATA:  Upper back pain. Fatigue. EXAM: PORTABLE CHEST 1 VIEW COMPARISON:  Radiograph 10/30/2018. CT 10/29/2018 FINDINGS: Loop recorder over the left chest wall.The cardiomediastinal contours are unchanged. Borderline cardiomegaly with aortic atherosclerosis. Mild chronic interstitial coarsening, emphysema on prior CT. Trace fluid in the right minor fissure with probable small right pleural effusion. No pulmonary edema. No consolidation or pneumothorax. No acute osseous abnormalities are seen. IMPRESSION: 1. Probable small right pleural effusion with fluid minor fissure. 2. Chronic interstitial coarsening, emphysema on prior CT. 3. Stable borderline cardiomegaly with aortic atherosclerosis. Aortic Atherosclerosis (ICD10-I70.0). Electronically Signed   By: Keith Rake M.D.   On: 11/27/2018 14:38     Assessment/Plan: Diagnosis: Right thalamic capsular infarct superimposed on subacute bilateral cerebellar intracranial hemorrhages, left hemiparesis and lethargy 1. Does the need for close, 24 hr/day medical supervision in concert with the patient's rehab needs make it unreasonable for this patient to be served in a less intensive setting? Potentially 2. Co-Morbidities requiring  supervision/potential complications: Hypertension, type 2 diabetes, COPD with O2 dependence 3. Due to bladder management, bowel management, safety, skin/wound care, disease management, medication administration, pain management and patient education, does the patient require 24 hr/day rehab nursing? Potentially 4. Does the patient require coordinated care of a physician, rehab nurse, therapy disciplines of PT, OT, speech to address physical and functional deficits in the context of the above medical diagnosis(es)? Potentially Addressing deficits in the following areas: balance, endurance, locomotion, strength, transferring, bowel/bladder control, bathing, dressing, feeding, grooming, toileting, cognition, speech, swallowing and psychosocial support 5. Can the patient actively participate in an intensive therapy program of at least 3 hrs of therapy per day at least 5 days per week? Potentially 6. The potential for patient to make measurable gains while on inpatient rehab is Currently is poor due to lethargy 7. Anticipated functional outcomes upon discharge from inpatient rehab are n/a  with PT, n/a with OT, n/a with  SLP. 8. Estimated rehab length of stay to reach the above functional goals is: Not applicable 9. Anticipated discharge destination: Home versus SNF depending on improvement in lethargy 10. Overall Rehab/Functional Prognosis: fair  RECOMMENDATIONS: This patient's condition is appropriate for continued rehabilitative care in the following setting: Currently not appropriate for CIR, will continue acute inpatient PT OT and reassess Patient has agreed to participate in recommended program. N/A Note that insurance prior authorization may be required for reimbursement for recommended care.  Comment:   "I have personally performed a face to face diagnostic evaluation of this patient.  Additionally, I have reviewed and concur with the physician assistant's documentation above."_0  Charlett Blake M.D. [facility] FAAPM&R (Sports Med, Neuromuscular Med) Diplomate Am Board of [place], PA-C 11/29/2018

## 2018-11-29 NOTE — Progress Notes (Signed)
OT Cancellation Note  Patient Details Name: Denise [name] MRN: [id number] DOB: 14-Oct-1945   Cancelled Treatment:    Reason Eval/Treat Not Completed: Patient at procedure or test/ unavailable. Attempted 2nd time to see patient and transport in room getting ready to take her for a test.  [place], OTR/L Acute Rehab Services Pager [phone] Office [phone]     Almon Register 11/29/2018, 11:07 AM

## 2018-11-29 NOTE — Significant Event (Addendum)
Rapid Response Event Note  Overview:Called d/t difficulty arousing pt.  Time Called: 2319 Arrival Time: 2324 Event Type: Neurologic  Initial Focused Assessment: On arrival, pt laying in bed with eyes closed, lethargic, arouses with repeated stimulation.  Pt difficult to arouse but after repeated stimulation, pt able to stay awake, speak, move all extremities, follow commands. Pt says she's just tired and wants to sleep. NIH unchanged from previous score. HR-77, BP-142/97, RR-18, SpO2-100% on 4L [place], CBG-121.  Interventions: CBG-121 Plan of Care (if not transferred): Continue to monitor pt closely. Call RRT if further assistance needed.  Event Summary:   at      at          [facility], Carren Rang

## 2018-11-29 NOTE — Progress Notes (Signed)
Physical Therapy Treatment Patient Details Name: Denise [name] MRN: [id number] DOB: 02/27/1945 Today's Date: 11/29/2018    History of Present Illness Pt is a 73 y/o female admitted with increasing L sided weakness and lethargy. Pt found to have new right thalamic capsular infarct. Pt with recent (11/2)  R cerebellar infarct s/p tPA w/ hemorrhagic transformation. PMH - DM, HTN, colon cancer, COPD, chronic respiratory failure with hypoxia, systolic CHF.    PT Comments    Pt incredibly limited this session secondary to lethargy. Pt resisting movement at times. Spouse present throughout session and reported that pt had been sleeping since he arrived earlier this morning around 10:00am. PT will continue to follow pt acutely to progress mobility as tolerated per PT POC.  Pt on 4L of O2 throughout with SPO2 maintaining at 100%.    Follow Up Recommendations  CIR     Equipment Recommendations  None recommended by PT    Recommendations for Other Services       Precautions / Restrictions Precautions Precautions: Fall Precaution Comments: monitor SpO2 sats (on 4L of O2 via nasal cannula baseline)  Restrictions Weight Bearing Restrictions: No    Mobility  Bed Mobility Overal bed mobility: Needs Assistance Bed Mobility: Rolling Rolling: Mod assist;Min assist         General bed mobility comments: min A to roll L, mod A to attempt to roll R; however, pt resistant to movement. Pt with preference to maintain L cervical rotation, L trunk rotation, L hip in ER and R hip in IR  Transfers                 General transfer comment: pt incredibly lethargic throughout session; only opening eyes twice for a very brief period of time (1-2 seconds) despite max efforts and multimodal stimuli  Ambulation/Gait                 Stairs             Wheelchair Mobility    Modified Rankin (Stroke Patients Only) Modified Rankin (Stroke Patients Only) Pre-Morbid Rankin  Score: Moderately severe disability Modified Rankin: Moderately severe disability     Balance                                            Cognition Arousal/Alertness: Lethargic Behavior During Therapy: Flat affect Overall Cognitive Status: Difficult to assess                                        Exercises Other Exercises Other Exercises: PROM to bilateral LEs with pt actively resisting movement intermittently of knee extension, knee flexion and bilateral hip rotation Other Exercises: facilitated trunk rotation bilaterally in supine with bilateral LEs in hip and knee flexion and pelvic rotation performed passively Other Exercises: PROM performed to bilateral UEs for elbow flexion/extension, forearm pronation and supination    General Comments        Pertinent Vitals/Pain Pain Assessment: No/denies pain    Home Living                      Prior Function            PT Goals (current goals can now be found in the care plan section) Acute Rehab  PT Goals PT Goal Formulation: With patient/family Time For Goal Achievement: 12/12/18 Potential to Achieve Goals: Good Progress towards PT goals: Not progressing toward goals - comment(limited secondary to lethargy)    Frequency    Min 4X/week      PT Plan Current plan remains appropriate    Co-evaluation              AM-PAC PT "6 Clicks" Mobility   Outcome Measure  Help needed turning from your back to your side while in a flat bed without using bedrails?: A Lot Help needed moving from lying on your back to sitting on the side of a flat bed without using bedrails?: A Lot Help needed moving to and from a bed to a chair (including a wheelchair)?: A Lot Help needed standing up from a chair using your arms (e.g., wheelchair or bedside chair)?: A Lot Help needed to walk in hospital room?: A Lot Help needed climbing 3-5 steps with a railing? : Total 6 Click Score: 11     End of Session Equipment Utilized During Treatment: Oxygen Activity Tolerance: Patient limited by lethargy Patient left: in bed;with call bell/phone within reach;with bed alarm set;with family/visitor present;with SCD's reapplied Nurse Communication: Mobility status PT Visit Diagnosis: Hemiplegia and hemiparesis;Other abnormalities of gait and mobility (R26.89) Hemiplegia - Right/Left: Left Hemiplegia - dominant/non-dominant: Non-dominant Hemiplegia - caused by: Cerebral infarction     Time: 0141-5973 PT Time Calculation (min) (ACUTE ONLY): 18 min  Charges:  $Therapeutic Exercise: 8-22 mins                     Anastasio Champion, DPT  Acute Rehabilitation Services Pager [phone] Office [place] 11/29/2018, 4:19 PM

## 2018-11-29 NOTE — Progress Notes (Signed)
Inpatient Rehabilitation-Admissions Coordinator   Please see PM&R MD consult note for details. Pt currently not appropriate for CIR due to lethargy. Will follow up Monday to see how pt progresses with therapy.   Jhonnie Garner, OTR/L  Rehab Admissions Coordinator  [phone] 11/29/2018 2:39 PM

## 2018-11-29 NOTE — Plan of Care (Signed)

## 2018-11-29 NOTE — Progress Notes (Signed)
   I am covering Cardmaster role today. Received request from Burnetta Sabin for ILR interrogation. Have notified [employer] rep, Tomi Bamberger. Per d/w neuro team, will have results called to Dr. Leonie Man. Please call cardiology team if any further assistance is needed.  Nyree Applegate PA-C

## 2018-11-29 NOTE — Progress Notes (Addendum)
Rapid response called to assess patient. Patient difficult to arouse, lethargic. Minor eye opening to sternal rub. Blood sugar WNL. After repeated stimulation patient awake and alert, speaking and following commands, NIH unchanged. Vital signs WDL. Pt. States she is tired and wants to sleep. Will continue to monitor.

## 2018-11-29 NOTE — Progress Notes (Signed)
OT Cancellation Note  Patient Details Name: Denise [name] MRN: [id number] DOB: 1945/04/03   Cancelled Treatment:    Reason Eval/Treat Not Completed: Patient at procedure or test/ unavailable. Pt currently down for a swallow test.  [place], OTR/L Acute Rehab Services Pager [phone] Office [phone]     Almon Register 11/29/2018, 7:38 AM

## 2018-11-29 NOTE — Progress Notes (Signed)
PROGRESS NOTE    SYDELLE SHERFIELD  LXB:[id number] DOB: 10-16-1945 DOA: 11/27/2018 PCP: [name], NP     Brief Narrative:  KALISSA GRAYS is a 73 y.o. female with medical history significant of type 2 diabetes not on treatment, hypertension, COPD with chronic hypoxic respiratory failure on 4 L oxygen, pulmonary hypertension who was recently admitted to the hospital with acute ischemic stroke, status post TPA with hemorrhagic conversion treated with cryoprecipitate and tranexamic acid, went to acute inpatient rehab and discharged home on 11/19/2018 comes back to the emergency room with her husband with more than usual weakness on the left side.  According to the husband at bedside, when she left the rehab, she was able to eat by herself, she was able to use her left hand.  She still had some weakness, however she was able to function and walk with support.  This morning, her husband found her hard to arouse, found with increasing speech difficulty and worsening left-sided weakness.  Patient also complaining of some shoulder pain and aching.  She has some dysarthria.  She uses 4 L oxygen at baseline and has no worsening shortness of breath or wheezing. MRI showed new right thalamocapsular stroke with evaluating old hemorrhagic strokes.  CTA with no acute occlusion, severe stenosis left vertebral artery origin.  Neurology consulted.  New events last 24 hours / Subjective: Had an episode of unresponsiveness last night, rapid response was called.  After repeated stimulation, she was awake and alert and able to follow commands.  This morning, she appears to be very tired but easily arousable.  She did not want to participate in physical examination today.  Assessment & Plan:   Principal Problem:   Acute ischemic right MCA stroke ([name]) Active Problems:   COPD GOLD  II  spirometry, severe Emphysema and 02 dep   Anxiety and depression   Hyperlipidemia   Essential hypertension  Chronic respiratory failure with hypoxia ([name])   GERD (gastroesophageal reflux disease)   ICH (intracerebral hemorrhage) ([name]) s/p tPA administration   Uncontrolled type 2 diabetes mellitus with hyperglycemia ([name])   Acute ischemic right thalamic stroke, multiple territory stroke with recent ischemic stroke -MRI brain acute infarct centered within the right thalamocapsular junction  -CTA head/neck no large vessel occlusion. No significant stenosis at the ICA origins -Neurology consulted -Continue aspirin, plavix for 3 weeks then aspirin alone -PT OT recommending CIR placement  COPD with chronic hypoxic failure on 4 L oxygen at baseline -Currently without exacerbation -Continue nasal cannula O2  Essential hypertension -Permissive hypertension   Type 2 diabetes -Recent A1c 7 -Continue sliding scale insulin  Prolonged QTc -Repeat EKG with improvement in QTC    DVT prophylaxis: SCD Code Status: Full Family Communication: No family at bedside Disposition Plan: CIR evaluation pending.  She was just discharged from [facility] 11/19/2018    Consultants:   Neurology   Antimicrobials:  Anti-infectives (From admission, onward)   None       Objective: Vitals:   11/28/18 2304 11/29/18 0156 11/29/18 0539 11/29/18 0838  BP: (!) 142/97 (!) 154/104 (!) 163/104   Pulse: 77  75 76  Resp:   18 18  Temp:   97.7 F (36.5 C)   TempSrc:      SpO2:  98% 99% 98%  Weight:      Height:       No intake or output data in the 24 hours ending 11/29/18 1000 Filed Weights   11/27/18 1241 11/28/18 0127  Weight: 42.6 kg 43.5 kg    Examination: General exam: Appears calm and comfortable  Respiratory system: Clear to auscultation. Respiratory effort normal. Cardiovascular system: S1 & S2 heard. No pedal edema. Gastrointestinal system: Abdomen is nondistended, soft and nontender. Normal bowel sounds heard. Central nervous system: Alert, did not follow commands or participate in exam  today Extremities: Symmetric in appearance bilaterally  Skin: No rashes, lesions or ulcers on exposed skin    Data Reviewed: I have personally reviewed following labs and imaging studies  CBC: Recent Labs  Lab 11/27/18 1333  WBC 4.0  NEUTROABS 2.5  HGB 13.5  HCT 45.5  MCV 86.8  PLT 702   Basic Metabolic Panel: Recent Labs  Lab 11/27/18 1333 11/28/18 1237 11/29/18 0747  NA 145  --  146*  K 4.6 4.5 4.2  CL 104  --  106  CO2 33*  --  29  GLUCOSE 116*  --  148*  BUN 21  --  25*  CREATININE 1.17*  --  1.16*  CALCIUM 11.7*  --  11.7*  MG  --  1.9  --    GFR: Estimated Creatinine Clearance: 29.7 mL/min (A) (by C-G formula based on SCr of 1.16 mg/dL (H)). Liver Function Tests: Recent Labs  Lab 11/27/18 1333  AST 34  ALT 18  ALKPHOS 108  BILITOT 1.0  PROT 7.8  ALBUMIN 4.5   No results for input(s): LIPASE, AMYLASE in the last 168 hours. No results for input(s): AMMONIA in the last 168 hours. Coagulation Profile: No results for input(s): INR, PROTIME in the last 168 hours. Cardiac Enzymes: No results for input(s): CKTOTAL, CKMB, CKMBINDEX, TROPONINI in the last 168 hours. BNP (last 3 results) Recent Labs    03/01/18 1218 05/02/18 1249 06/24/18 1111  PROBNP 1,336.0* 910.0* 840.0*   HbA1C: Recent Labs    11/27/18 1333  HGBA1C 7.0*   CBG: Recent Labs  Lab 11/28/18 1146 11/28/18 1703 11/28/18 2130 11/28/18 2330 11/29/18 0749  GLUCAP 127* 190* 116* 121* 144*   Lipid Profile: No results for input(s): CHOL, HDL, LDLCALC, TRIG, CHOLHDL, LDLDIRECT in the last 72 hours. Thyroid Function Tests: No results for input(s): TSH, T4TOTAL, FREET4, T3FREE, THYROIDAB in the last 72 hours. Anemia Panel: No results for input(s): VITAMINB12, FOLATE, FERRITIN, TIBC, IRON, RETICCTPCT in the last 72 hours. Sepsis Labs: No results for input(s): PROCALCITON, LATICACIDVEN in the last 168 hours.  Recent Results (from the past 240 hour(s))  SARS CORONAVIRUS 2 (TAT 6-24  HRS) Nasopharyngeal Nasopharyngeal Swab     Status: None   Collection Time: 11/27/18  2:47 PM   Specimen: Nasopharyngeal Swab  Result Value Ref Range Status   SARS Coronavirus 2 NEGATIVE NEGATIVE Final    Comment: (NOTE) SARS-CoV-2 target nucleic acids are NOT DETECTED. The SARS-CoV-2 RNA is generally detectable in upper and lower respiratory specimens during the acute phase of infection. Negative results do not preclude SARS-CoV-2 infection, do not rule out co-infections with other pathogens, and should not be used as the sole basis for treatment or other patient management decisions. Negative results must be combined with clinical observations, patient history, and epidemiological information. The expected result is Negative. Fact Sheet for Patients: SugarRoll.be Fact Sheet for Healthcare Providers: https://www.woods-mathews.com/ This test is not yet approved or cleared by the [place] FDA and  has been authorized for detection and/or diagnosis of SARS-CoV-2 by FDA under an Emergency Use Authorization (EUA). This EUA will remain  in effect (meaning this test can be used) for  the duration of the COVID-19 declaration under Section 56 4(b)(1) of the Act, 21 U.S.C. section 360bbb-3(b)(1), unless the authorization is terminated or revoked sooner. Performed at [facility], [place] [address] [place], [place] [address]       Radiology Studies: Ct Angio Head W Or Wo Contrast  Result Date: 11/27/2018 CLINICAL DATA:  Facial weakness, left arm weakness, subacute cerebellar infarct EXAM: CT ANGIOGRAPHY HEAD AND NECK TECHNIQUE: Multidetector CT imaging of the head and neck was performed using the standard protocol during bolus administration of intravenous contrast. Multiplanar CT image reconstructions and MIPs were obtained to evaluate the vascular anatomy. Carotid stenosis measurements (when applicable) are obtained utilizing NASCET  criteria, using the distal internal carotid diameter as the denominator. CONTRAST:  59m OMNIPAQUE IOHEXOL 350 MG/ML SOLN COMPARISON:  None. FINDINGS: Motion artifact is present. CTA NECK FINDINGS Aortic arch: Aortic arch is minimally imaged. Great vessel origins are patent. Right carotid system: Common, internal, and external carotid arteries are patent. Mild calcified plaque is present at the proximal ICA without measurable stenosis. Left carotid system: Common, internal, and external carotid arteries are patent. Small focus of plaque ulceration at the left ICA origin. There is no measurable stenosis. Vertebral arteries: Dominant right vertebral artery is patent with mild calcified plaque near the origin. Left vertebral artery is patent with calcified plaque causing apparent high-grade stenosis but no evidence of flow limitation. Skeleton: Degenerative changes of the cervical spine. Other neck: Mildly enlarged and heterogeneous thyroid. No neck mass or adenopathy within limitation of artifact. Upper chest: Severe emphysema. Review of the MIP images confirms the above findings CTA HEAD FINDINGS Anterior circulation: Intracranial internal carotid arteries are patent with atherosclerotic irregularity and mild calcified plaque. Anterior and middle cerebral arteries are patent. Posterior circulation: Intracranial vertebral arteries are patent. There is moderate stenosis of the left vertebral artery. Basilar artery is patent. Posterior cerebral arteries are patent. Venous sinuses: As permitted by contrast timing, patent. Anatomic variants: Diminutive bilateral posterior communicating arteries. Review of the MIP images confirms the above findings IMPRESSION: No large vessel occlusion. No significant stenosis at the ICA origins. Plaque at the left vertebral artery origin causing severe stenosis but without evidence of flow limitation. Moderate stenosis of the intracranial left vertebral artery. Electronically Signed   By:  PMacy MisM.D.   On: 11/27/2018 15:43   Ct Head Wo Contrast  Result Date: 11/27/2018 CLINICAL DATA:  Left arm weakness, facial weakness, history of stroke, increased drowsiness EXAM: CT HEAD WITHOUT CONTRAST TECHNIQUE: Contiguous axial images were obtained from the base of the skull through the vertex without intravenous contrast. COMPARISON:  11/07/2018, MR brain, 11/05/2018 FINDINGS: Brain: There is decreased size and hyperdensity of a subacute hemorrhagic infarction of the right cerebellar hemisphere (series 2, image 7). Decreased size and conspicuity of or a subacute edematous, nonhemorrhagic infarction of the left cerebellar hemisphere (series 2, image 6). Periventricular white matter hypodensity. Redemonstrated encephalomalacia of the left frontal lobe (series 3, image 20). Vascular: No hyperdense vessel or unexpected calcification. Skull: Normal. Negative for fracture or focal lesion. Sinuses/Orbits: No acute finding. Other: None. IMPRESSION: 1. There is decreased size and hyperdensity of a subacute hemorrhagic infarction of the right cerebellar hemisphere (series 2, image 7). 2. Decreased size and conspicuity of or a subacute edematous, nonhemorrhagic infarction of the left cerebellar hemisphere (series 2, image 6). 3.  Nonacute encephalomalacia of the left frontal lobe. 4.  Small-vessel white matter disease. Electronically Signed   By: ADorna BloomD.  On: 11/27/2018 15:11   Ct Angio Neck W And/or Wo Contrast  Result Date: 11/27/2018 CLINICAL DATA:  Facial weakness, left arm weakness, subacute cerebellar infarct EXAM: CT ANGIOGRAPHY HEAD AND NECK TECHNIQUE: Multidetector CT imaging of the head and neck was performed using the standard protocol during bolus administration of intravenous contrast. Multiplanar CT image reconstructions and MIPs were obtained to evaluate the vascular anatomy. Carotid stenosis measurements (when applicable) are obtained utilizing NASCET criteria, using the  distal internal carotid diameter as the denominator. CONTRAST:  63m OMNIPAQUE IOHEXOL 350 MG/ML SOLN COMPARISON:  None. FINDINGS: Motion artifact is present. CTA NECK FINDINGS Aortic arch: Aortic arch is minimally imaged. Great vessel origins are patent. Right carotid system: Common, internal, and external carotid arteries are patent. Mild calcified plaque is present at the proximal ICA without measurable stenosis. Left carotid system: Common, internal, and external carotid arteries are patent. Small focus of plaque ulceration at the left ICA origin. There is no measurable stenosis. Vertebral arteries: Dominant right vertebral artery is patent with mild calcified plaque near the origin. Left vertebral artery is patent with calcified plaque causing apparent high-grade stenosis but no evidence of flow limitation. Skeleton: Degenerative changes of the cervical spine. Other neck: Mildly enlarged and heterogeneous thyroid. No neck mass or adenopathy within limitation of artifact. Upper chest: Severe emphysema. Review of the MIP images confirms the above findings CTA HEAD FINDINGS Anterior circulation: Intracranial internal carotid arteries are patent with atherosclerotic irregularity and mild calcified plaque. Anterior and middle cerebral arteries are patent. Posterior circulation: Intracranial vertebral arteries are patent. There is moderate stenosis of the left vertebral artery. Basilar artery is patent. Posterior cerebral arteries are patent. Venous sinuses: As permitted by contrast timing, patent. Anatomic variants: Diminutive bilateral posterior communicating arteries. Review of the MIP images confirms the above findings IMPRESSION: No large vessel occlusion. No significant stenosis at the ICA origins. Plaque at the left vertebral artery origin causing severe stenosis but without evidence of flow limitation. Moderate stenosis of the intracranial left vertebral artery. Electronically Signed   By: PMacy MisM.D.    On: 11/27/2018 15:43   Mr Brain Wo Contrast  Result Date: 11/27/2018 CLINICAL DATA:  Muscle weakness (generalized). Additional history provided: Facial weakness, left arm weakness, subacute cerebellar infarct. EXAM: MRI HEAD WITHOUT CONTRAST TECHNIQUE: Multiplanar, multiecho pulse sequences of the brain and surrounding structures were obtained without intravenous contrast. COMPARISON:  CT angiogram head/neck 11/27/2018, noncontrast head CT 11/27/2018, head CT 11/07/2018, MRI/MRA head 11/05/2018 FINDINGS: Brain: There is an acute infarct centered within the right thalamocapsular junction involving the adjacent white matter tracks superiorly and inferiorly. This measures 1.5 x 1.0 x 1.8 cm (AP x TV x CC). No evidence of acute infarct elsewhere within the brain. There has been expected evolution of a large hemorrhagic subacute right cerebellar infarct. There has also been expected evolution of a moderate-sized nonhemorrhagic subacute left cerebellar infarct. Superimposed bilateral chronic cerebellar lacunar infarcts. Redemonstrated are multiple small hemorrhagic and nonhemorrhagic cortical infarcts within the bilateral cerebral hemispheres. Redemonstrated chronic lacunar infarct within the right thalamus. Background of otherwise mild chronic small vessel ischemic disease Vascular: Flow voids maintained within the proximal large arterial vessels. Skull and upper cervical spine: No focal marrow lesion. Sinuses/Orbits: Visualized orbits demonstrate no acute abnormality. Mild ethmoid sinus mucosal thickening. No significant mastoid effusion. These results were called by telephone at the time of interpretation on 11/27/2018 at 5:21 pm to provider PA RQuentin Cornwall who verbally acknowledged these results. IMPRESSION: 1.5 x 1.0 x 1.8 cm  acute infarct centered within the right thalamocapsular junction involving the adjacent white matter tracks superiorly and inferiorly. Expected evolution of subacute large hemorrhagic right  cerebellar and moderate-sized nonhemorrhagic left cerebellar infarcts. Multiple redemonstrated chronic infarcts within the cerebral cortex, right thalamus and bilateral cerebellar hemispheres. Background of mild chronic small vessel ischemic disease. Electronically Signed   By: Kellie Simmering DO   On: 11/27/2018 17:22   Dg Chest Portable 1 View  Result Date: 11/27/2018 CLINICAL DATA:  Upper back pain. Fatigue. EXAM: PORTABLE CHEST 1 VIEW COMPARISON:  Radiograph 10/30/2018. CT 10/29/2018 FINDINGS: Loop recorder over the left chest wall.The cardiomediastinal contours are unchanged. Borderline cardiomegaly with aortic atherosclerosis. Mild chronic interstitial coarsening, emphysema on prior CT. Trace fluid in the right minor fissure with probable small right pleural effusion. No pulmonary edema. No consolidation or pneumothorax. No acute osseous abnormalities are seen. IMPRESSION: 1. Probable small right pleural effusion with fluid minor fissure. 2. Chronic interstitial coarsening, emphysema on prior CT. 3. Stable borderline cardiomegaly with aortic atherosclerosis. Aortic Atherosclerosis (ICD10-I70.0). Electronically Signed   By: Keith Rake M.D.   On: 11/27/2018 14:38      Scheduled Meds:  aspirin EC  81 mg Oral Daily   clopidogrel  75 mg Oral Daily   fluticasone furoate-vilanterol  1 puff Inhalation Daily   furosemide  20 mg Oral Daily   insulin aspart  0-5 Units Subcutaneous QHS   insulin aspart  0-9 Units Subcutaneous TID WC   metoprolol tartrate  12.5 mg Oral BID   mirtazapine  15 mg Oral QHS   spironolactone  25 mg Oral Daily   umeclidinium bromide  1 puff Inhalation Daily   Continuous Infusions:   LOS: 2 days      Time spent: 25 minutes   Dessa Phi, DO [facility] 11/29/2018, 10:00 AM   Available via Epic secure chat 7am-7pm After these hours, please refer to coverage provider listed on [email or web address]

## 2018-11-29 NOTE — Progress Notes (Signed)
Modified Barium Swallow Progress Note  Patient Details  Name: Denise [name] MRN: [id number] Date of Birth: 1945-05-14  Today's Date: 11/29/2018  Modified Barium Swallow completed.  Full report located under Chart Review in the Imaging Section.  Brief recommendations include the following:  Clinical Impression  Difficult exam due to pt's lethargy and inability to fully participate.  By the end of the study, she was able to arouse to consume sips of thin and nectar-liquids, but given her MS, the assessment was limited.  She demonstrated decreased bolus organization, premature spillage of thin liquids with trace aspiration before laryngeal-vestibular closure. Nectars were noted to penetrate the larynx, but were not aspirated.  There was minimal residue post-swallow.  Aspiration was accompanied by a mild cough.  Pt was not alert enough to try strategies/postures that may have afforded her improved airway protection.  For now, recommend dysphagia 1 diet, nectar-thick liquids; crush meds in puree.  SLP will follow for diet advancement and further investigation into swallow physiology as MS improves.    Swallow Evaluation Recommendations       SLP Diet Recommendations: Dysphagia 1 (Puree) solids;Nectar thick liquid   Liquid Administration via: Cup   Medication Administration: Crushed with puree   Supervision: Full assist for feeding   Compensations: Slow rate;Small sips/bites       Oral Care Recommendations: Oral care BID   Other Recommendations: Order thickener from [place]. Tivis Ringer, [place] Office number [phone] Pager [phone]  Juan Quam Laurice 11/29/2018,1:34 PM

## 2018-11-30 LAB — GLUCOSE, CAPILLARY
Glucose-Capillary: 220 mg/dL — ABNORMAL HIGH (ref 70–99)
Glucose-Capillary: 131 mg/dL — ABNORMAL HIGH (ref 70–99)
Glucose-Capillary: 177 mg/dL — ABNORMAL HIGH (ref 70–99)
Glucose-Capillary: 103 mg/dL — ABNORMAL HIGH (ref 70–99)

## 2018-11-30 MED ORDER — ORAL CARE MOUTH RINSE
15.0000 mL | Freq: Two times a day (BID) | OROMUCOSAL | Status: DC
  Administered 2018-11-30 – 2018-12-06 (×7): 15 mL via OROMUCOSAL

## 2018-11-30 NOTE — Progress Notes (Signed)
Physical Therapy Treatment Patient Details Name: Denise [name] MRN: [id number] DOB: 1945/03/24 Today's Date: 11/30/2018    History of Present Illness Pt is a 73 y/o female admitted with increasing L sided weakness and lethargy. Pt found to have new right thalamic capsular infarct. Pt with recent (11/2)  R cerebellar infarct s/p tPA w/ hemorrhagic transformation. PMH - DM, HTN, colon cancer, COPD, chronic respiratory failure with hypoxia, systolic CHF.    PT Comments    Pt performed bed mobility with min assistance and transfer into standing with moderate assistance.  She stood x 2 mins with cues for weight shifting R and for core stabilization.  She is O2 dependent and presents with DOE.  Continue to progress mobility during hospital stay.  CIR remains appropriate to improve strength and function before returning home.     Follow Up Recommendations  CIR     Equipment Recommendations  None recommended by PT    Recommendations for Other Services Rehab consult     Precautions / Restrictions Precautions Precautions: Fall Precaution Comments: monitor SpO2 sats (on 4L of O2 via nasal cannula baseline)  Restrictions Weight Bearing Restrictions: No    Mobility  Bed Mobility Overal bed mobility: Needs Assistance Bed Mobility: Rolling     Supine to sit: Min assist     General bed mobility comments: Min assistance to move LEs to edge of bed and to elevate trunk into sitting.  Transfers Overall transfer level: Needs assistance Equipment used: Rolling walker (2 wheeled) Transfers: Sit to/from Stand Sit to Stand: Mod assist         General transfer comment: Posterior lean noted, Pt required min to moderate to maintain standing and increased time to achieve placement of L hand.  Ambulation/Gait                 Stairs             Wheelchair Mobility    Modified Rankin (Stroke Patients Only)       Balance Overall balance assessment: Needs  assistance   Sitting balance-Leahy Scale: Fair       Standing balance-Leahy Scale: Poor Standing balance comment: L lateral lean with trunk rotation.                            Cognition Arousal/Alertness: Lethargic Behavior During Therapy: Flat affect Overall Cognitive Status: Within Functional Limits for tasks assessed                                 General Comments: Alert and oriented x4, appears within normal limits for tasks assessed but did not formally assess cognition.      Exercises      General Comments        Pertinent Vitals/Pain Pain Assessment: No/denies pain Faces Pain Scale: No hurt    Home Living                      Prior Function            PT Goals (current goals can now be found in the care plan section) Acute Rehab PT Goals Patient Stated Goal: not stated Potential to Achieve Goals: Good Progress towards PT goals: Progressing toward goals    Frequency    Min 4X/week      PT Plan Current plan remains appropriate    Co-evaluation  AM-PAC PT "6 Clicks" Mobility   Outcome Measure  Help needed turning from your back to your side while in a flat bed without using bedrails?: A Lot Help needed moving from lying on your back to sitting on the side of a flat bed without using bedrails?: A Lot Help needed moving to and from a bed to a chair (including a wheelchair)?: A Lot Help needed standing up from a chair using your arms (e.g., wheelchair or bedside chair)?: A Lot Help needed to walk in hospital room?: A Lot Help needed climbing 3-5 steps with a railing? : Total 6 Click Score: 11    End of Session Equipment Utilized During Treatment: Oxygen Activity Tolerance: Patient limited by lethargy Patient left: in bed;with call bell/phone within reach;with bed alarm set;with family/visitor present;with SCD's reapplied Nurse Communication: Mobility status PT Visit Diagnosis: Hemiplegia and  hemiparesis;Other abnormalities of gait and mobility (R26.89) Hemiplegia - Right/Left: Left Hemiplegia - caused by: Cerebral infarction     Time: 1251-1309 PT Time Calculation (min) (ACUTE ONLY): 18 min  Charges:  $Therapeutic Activity: 8-22 mins                     Erasmo Leventhal , PTA Acute Rehabilitation Services Pager [phone] Office [phone]     Neaveh Belanger Eli Hose 11/30/2018, 2:11 PM

## 2018-11-30 NOTE — Plan of Care (Signed)

## 2018-11-30 NOTE — Evaluation (Signed)
Occupational Therapy Evaluation Patient Details Name: Denise [name] MRN: [id number] DOB: November 27, 1945 Today's Date: 11/30/2018    History of Present Illness Pt is a 73 y/o female admitted with increasing L sided weakness and lethargy. Pt found to have new right thalamic capsular infarct. Pt with recent (11/2)  R cerebellar infarct s/p tPA w/ hemorrhagic transformation. PMH - DM, HTN, colon cancer, COPD, chronic respiratory failure with hypoxia, systolic CHF.   Clinical Impression   This 73 yo female admitted with above presents to acute OT with decreased vision, decreased balance, decreased mobility, decreased safety awareness all affecting her safety and independence with basic ADLs. She will benefit from acute OT with follow up on CIR to work towards a more independent level of care.    Follow Up Recommendations  CIR;Supervision/Assistance - 24 hour    Equipment Recommendations  Other (comment)(TBD next venue)       Precautions / Restrictions Precautions Precautions: Fall Precaution Comments: monitor SpO2 sats (on 2L of O2 via nasal cannula baseline--curently on 3) Restrictions Weight Bearing Restrictions: No      Mobility Bed Mobility Overal bed mobility: Needs Assistance Bed Mobility: Rolling     Supine to sit: Mod assist;HOB elevated     General bed mobility comments: A for legs, trunk, and scoot to EOB  Transfers Overall transfer level: Needs assistance Equipment used: Rolling walker (2 wheeled) Transfers: Sit to/from Stand Sit to Stand: Mod assist         General transfer comment: Pt with posterior lean, able to stand fully up but still with posterior lean    Balance Overall balance assessment: Needs assistance Sitting-balance support: No upper extremity supported;Feet supported Sitting balance-Leahy Scale: Fair     Standing balance support: Bilateral upper extremity supported Standing balance-Leahy Scale: Poor Standing balance comment: L lateral  lean                            ADL either performed or assessed with clinical judgement   ADL   Eating/Feeding: Supervision/ safety;Set up;Bed level Eating/Feeding Details (indicate cue type and reason): eating putting and drinking from cup Grooming: Bed level;Moderate assistance   Upper Body Bathing: Moderate assistance;Bed level   Lower Body Bathing: Total assistance(Mod A sit<>stand)   Upper Body Dressing : Maximal assistance;Sitting   Lower Body Dressing: Total assistance(Mod A for standing)   Toilet Transfer: Moderate assistance Toilet Transfer Details (indicate cue type and reason): side step up towards [place] [employer] and Hygiene: Total assistance(mod A sit<>stand)               Vision Baseline Vision/History: Wears glasses Wears Glasses: At all times Vision Assessment?: Yes Eye Alignment: (disconjugate gaze) Ocular Range of Motion: (pt had a hard time following for tracking to look at Occular ROM) Additional Comments: pt needs continue assessment in functional context            Pertinent Vitals/Pain Pain Assessment: No/denies pain Faces Pain Scale: No hurt     Hand Dominance Right   Extremity/Trunk Assessment Upper Extremity Assessment Upper Extremity Assessment: Overall WFL for tasks assessed LUE Deficits / Details: Able to self feed pudding holding cup in left hand and eating with right hand           Communication Communication Communication: No difficulties   Cognition Arousal/Alertness: Awake/alert Behavior During Therapy: Flat affect Overall Cognitive Status: Within Functional Limits for tasks assessed  General Comments: Alert and oriented x4, appears within normal limits for tasks assessed but did not formally assess cognition.              Home Living Family/patient expects to be discharged to:: Inpatient rehab Living Arrangements: Spouse/significant  other Available Help at Discharge: Family;Available 24 hours/day Type of Home: House Home Access: Stairs to enter [employer] of Steps: 4 Entrance Stairs-Rails: Right;Left Home Layout: One level     Bathroom Shower/Tub: Walk-in Hydrologist: Standard Bathroom Accessibility: Yes How Accessible: Accessible via walker Home Equipment: [place] - 2 wheels;Tub bench   Additional Comments: Home O2 @ 2L. Information from recent admission  Lives With: Spouse    Prior Functioning/Environment Level of Independence: Needs assistance  Gait / Transfers Assistance Needed: supervision for amb without assistive device ADL's / Homemaking Assistance Needed: Report she could do her own shower sitting on tub bench and could dress herself and toilet herself            OT Problem List: Decreased strength;Decreased range of motion;Impaired balance (sitting and/or standing);Impaired vision/perception;Decreased safety awareness;Impaired UE functional use      OT Treatment/Interventions: Self-care/ADL training;Therapeutic activities;Visual/perceptual remediation/compensation;DME and/or AE instruction;Patient/family education;Balance training    OT Goals(Current goals can be found in the care plan section) Acute Rehab OT Goals Patient Stated Goal: to go home OT Goal Formulation: With patient Time For Goal Achievement: 12/14/18 Potential to Achieve Goals: Good  OT Frequency: Min 2X/week              AM-PAC OT "6 Clicks" Daily Activity     Outcome Measure Help from another person eating meals?: A Little Help from another person taking care of personal grooming?: A Lot Help from another person toileting, which includes using toliet, bedpan, or urinal?: A Lot Help from another person bathing (including washing, rinsing, drying)?: A Lot Help from another person to put on and taking off regular upper body clothing?: A Lot Help from another person to put on and taking  off regular lower body clothing?: Total 6 Click Score: 12   End of Session Equipment Utilized During Treatment: Gait belt;Oxygen(3 liters)  Activity Tolerance: Patient tolerated treatment well Patient left: in bed;with call bell/phone within reach;with bed alarm set  OT Visit Diagnosis: Other abnormalities of gait and mobility (R26.89);Unsteadiness on feet (R26.81);Muscle weakness (generalized) (M62.81);Other symptoms and signs involving cognitive function;Low vision, both eyes (H54.2);Hemiplegia and hemiparesis Hemiplegia - Right/Left: Left Hemiplegia - dominant/non-dominant: Non-Dominant Hemiplegia - caused by: Cerebral infarction                Time: 7209-1068 OT Time Calculation (min): 19 min Charges:  OT General Charges $OT Visit: 1 Visit OT Evaluation $OT Eval Moderate Complexity: 1 Mod  [place] [address], OTR/L Acute [employer] Pager [phone] Office [phone]    Almon Register 11/30/2018, 5:53 PM

## 2018-11-30 NOTE — Progress Notes (Signed)
PROGRESS NOTE    Denise [name]  ZCH:[id number] DOB: 1945-01-07 DOA: 11/27/2018 PCP: [name], NP     Brief Narrative:  Denise [name] is a 73 y.o. female with medical history significant of type 2 diabetes not on treatment, hypertension, COPD with chronic hypoxic respiratory failure on 4 L oxygen, pulmonary hypertension who was recently admitted to the hospital with acute ischemic stroke, status post TPA with hemorrhagic conversion treated with cryoprecipitate and tranexamic acid, went to acute inpatient rehab and discharged home on 11/19/2018 comes back to the emergency room with her husband with more than usual weakness on the left side.  According to the husband at bedside, when she left the rehab, she was able to eat by herself, she was able to use her left hand.  She still had some weakness, however she was able to function and walk with support.  This morning, her husband found her hard to arouse, found with increasing speech difficulty and worsening left-sided weakness.  Patient also complaining of some shoulder pain and aching.  She has some dysarthria.  She uses 4 L oxygen at baseline and has no worsening shortness of breath or wheezing. MRI showed new right thalamocapsular stroke with evaluating old hemorrhagic strokes.  CTA with no acute occlusion, severe stenosis left vertebral artery origin.  Neurology consulted.  New events last 24 hours / Subjective: Easily arousable but does not want to participate in exam today.  Per nursing, patient has been very lethargic but arousable and able to take a bite of her medication earlier this morning.  Assessment & Plan:   Principal Problem:   Acute ischemic right MCA stroke ([name]) Active Problems:   COPD GOLD  II  spirometry, severe Emphysema and 02 dep   Anxiety and depression   Hyperlipidemia   Essential hypertension   Chronic respiratory failure with hypoxia ([name])   GERD (gastroesophageal reflux disease)   ICH  (intracerebral hemorrhage) ([name]) s/p tPA administration   Uncontrolled type 2 diabetes mellitus with hyperglycemia ([name])   Acute ischemic right thalamic stroke, multiple territory stroke with recent ischemic stroke -MRI brain acute infarct centered within the right thalamocapsular junction  -CTA head/neck no large vessel occlusion. No significant stenosis at the ICA origins -Neurology consulted -Continue aspirin, plavix for 3 weeks then aspirin alone -PT OT recommending CIR placement  COPD with chronic hypoxic failure on 4 L oxygen at baseline -Currently without exacerbation -Continue nasal cannula O2  Essential hypertension -Permissive hypertension   Type 2 diabetes -Recent A1c 7 -Continue sliding scale insulin  Prolonged QTc -Repeat EKG with improvement in QTC    DVT prophylaxis: SCD Code Status: Full Family Communication: No family at bedside Disposition Plan: CIR evaluation pending.  She was just discharged from [facility] 11/19/2018    Consultants:   Neurology   Antimicrobials:  Anti-infectives (From admission, onward)   None       Objective: Vitals:   11/29/18 1800 11/29/18 2300 11/30/18 0401 11/30/18 0918  BP:  (!) 146/88 (!) 143/96   Pulse:  93 87   Resp:   18   Temp:  98 F (36.7 C) 98.4 F (36.9 C)   TempSrc:  Oral Oral   SpO2: 95% 92% 99% 100%  Weight:      Height:        Intake/Output Summary (Last 24 hours) at 11/30/2018 1002 Last data filed at 11/29/2018 1900 Gross per 24 hour  Intake 240 ml  Output 200 ml  Net 40 ml  Filed Weights   11/27/18 1241 11/28/18 0127  Weight: 42.6 kg 43.5 kg    Examination: General exam: Appears calm and comfortable  Respiratory system: Clear to auscultation. Respiratory effort normal. Cardiovascular system: S1 & S2 heard, RRR. No pedal edema. Gastrointestinal system: Abdomen is nondistended, soft and nontender. Normal bowel sounds heard. Central nervous system: Alert.  Does not want to participate in  examination this morning Extremities: Symmetric in appearance bilaterally  Skin: No rashes, lesions or ulcers on exposed skin     Data Reviewed: I have personally reviewed following labs and imaging studies  CBC: Recent Labs  Lab 11/27/18 1333  WBC 4.0  NEUTROABS 2.5  HGB 13.5  HCT 45.5  MCV 86.8  PLT 811   Basic Metabolic Panel: Recent Labs  Lab 11/27/18 1333 11/28/18 1237 11/29/18 0747  NA 145  --  146*  K 4.6 4.5 4.2  CL 104  --  106  CO2 33*  --  29  GLUCOSE 116*  --  148*  BUN 21  --  25*  CREATININE 1.17*  --  1.16*  CALCIUM 11.7*  --  11.7*  MG  --  1.9  --    GFR: Estimated Creatinine Clearance: 29.7 mL/min (A) (by C-G formula based on SCr of 1.16 mg/dL (H)). Liver Function Tests: Recent Labs  Lab 11/27/18 1333  AST 34  ALT 18  ALKPHOS 108  BILITOT 1.0  PROT 7.8  ALBUMIN 4.5   No results for input(s): LIPASE, AMYLASE in the last 168 hours. No results for input(s): AMMONIA in the last 168 hours. Coagulation Profile: No results for input(s): INR, PROTIME in the last 168 hours. Cardiac Enzymes: No results for input(s): CKTOTAL, CKMB, CKMBINDEX, TROPONINI in the last 168 hours. BNP (last 3 results) Recent Labs    03/01/18 1218 05/02/18 1249 06/24/18 1111  PROBNP 1,336.0* 910.0* 840.0*   HbA1C: Recent Labs    11/27/18 1333  HGBA1C 7.0*   CBG: Recent Labs  Lab 11/29/18 0749 11/29/18 1221 11/29/18 1718 11/29/18 2135 11/30/18 0757  GLUCAP 144* 120* 224* 255* 220*   Lipid Profile: No results for input(s): CHOL, HDL, LDLCALC, TRIG, CHOLHDL, LDLDIRECT in the last 72 hours. Thyroid Function Tests: No results for input(s): TSH, T4TOTAL, FREET4, T3FREE, THYROIDAB in the last 72 hours. Anemia Panel: No results for input(s): VITAMINB12, FOLATE, FERRITIN, TIBC, IRON, RETICCTPCT in the last 72 hours. Sepsis Labs: No results for input(s): PROCALCITON, LATICACIDVEN in the last 168 hours.  Recent Results (from the past 240 hour(s))  SARS  CORONAVIRUS 2 (TAT 6-24 HRS) Nasopharyngeal Nasopharyngeal Swab     Status: None   Collection Time: 11/27/18  2:47 PM   Specimen: Nasopharyngeal Swab  Result Value Ref Range Status   SARS Coronavirus 2 NEGATIVE NEGATIVE Final    Comment: (NOTE) SARS-CoV-2 target nucleic acids are NOT DETECTED. The SARS-CoV-2 RNA is generally detectable in upper and lower respiratory specimens during the acute phase of infection. Negative results do not preclude SARS-CoV-2 infection, do not rule out co-infections with other pathogens, and should not be used as the sole basis for treatment or other patient management decisions. Negative results must be combined with clinical observations, patient history, and epidemiological information. The expected result is Negative. Fact Sheet for Patients: SugarRoll.be Fact Sheet for Healthcare Providers: https://www.woods-mathews.com/ This test is not yet approved or cleared by the [place] FDA and  has been authorized for detection and/or diagnosis of SARS-CoV-2 by FDA under an Emergency Use Authorization (EUA). This EUA  will remain  in effect (meaning this test can be used) for the duration of the COVID-19 declaration under Section 56 4(b)(1) of the Act, 21 U.S.C. section 360bbb-3(b)(1), unless the authorization is terminated or revoked sooner. Performed at [facility], [place] [address], [place], [place] [address]       Radiology Studies: Dg Swallowing Func-speech Pathology  Result Date: 11/29/2018 Objective Swallowing Evaluation: Type of Study: MBS-Modified Barium Swallow Study  Patient Details Name: Denise [name] MRN: [id number] Date of Birth: 06-Nov-1945 Today's Date: 11/29/2018 Time: SLP Start Time (ACUTE ONLY): 1110 -SLP Stop Time (ACUTE ONLY): 1132 SLP Time Calculation (min) (ACUTE ONLY): 22 min Past Medical History: Past Medical History: Diagnosis Date . Altered mental status  . Anxiety and  depression  . Chronic respiratory failure with hypoxia ([place]) 10/02/2017  Assoc with ? Cor pulmonale dx 09/2017 so rx 02 1-2 lpm 24/7  - 10/17/2017  Walked RA x 3 laps @ 185 ft each stopped due to  End of study, nl pace,  desat to 87 on 3rd lap - 12/19/2017  Saturations on Room Air at Rest =91 % and while Ambulating = 87%  But  on  2 Liters of pulsed oxygen while Ambulating =93% so rec POC 2lpm walking / use at rest if sats under 90%     . Colon cancer ([place]) 1988  Resected . COPD (chronic obstructive pulmonary disease) ([place])  . Diabetes mellitus without complication ([name])   diet controlled- no meds. per pt . Diarrhea 04/02/2018 . Diverticulosis  . Hyperlipidemia  . Hypertension  . Hypoxia 10/30/2018 . Insomnia  . Primary hyperparathyroidism ([place])  . SBO (small bowel obstruction) ([place])  . Stroke ([place]) 10/2018  tpa administered . Tubular adenoma of rectum 02/23/2013  low grade . Vitamin D deficiency  Past Surgical History: Past Surgical History: Procedure Laterality Date . ABDOMINAL HYSTERECTOMY  08/2015 . BREAST BIOPSY Left  . BREAST EXCISIONAL BIOPSY Right  . COLON RESECTION  1980's . COLONOSCOPY   . ENDOMETRIAL BIOPSY  2009  negative . INCONTINENCE SURGERY  2017 . LOOP RECORDER INSERTION N/A 11/08/2018  Procedure: LOOP RECORDER INSERTION;  Surgeon: Constance Haw, MD;  Location: [place] [facility];  Service: Cardiovascular;  Laterality: N/A; . RIGHT HEART CATH N/A 09/20/2017  Procedure: RIGHT HEART CATH;  Surgeon: Larey Dresser, MD;  Location: [place] [facility];  Service: Cardiovascular;  Laterality: N/A; . RIGHT/LEFT HEART CATH AND CORONARY ANGIOGRAPHY N/A 03/19/2018  Procedure: RIGHT/LEFT HEART CATH AND CORONARY ANGIOGRAPHY;  Surgeon: Larey Dresser, MD;  Location: [place] [facility];  Service: Cardiovascular;  Laterality: N/A; HPI: 73 y.o. female with past medical history significant for type 2 diabetes mellitus, hypertension, COPD, recent stroke  post TPA (11/2) with right cerebellar hemorrhagic  transformation with new right thalamic capsular infarction.  CT head shows no significant changes, redemonstrates left vertebral artery severe stenosis.  Subjective: very sleepy Assessment / Plan / Recommendation CHL IP CLINICAL IMPRESSIONS 11/29/2018 Clinical Impression Difficult exam due to pt's lethargy and inability to fully participate.  By the end of the study, she was able to arouse to consume sips of thin and nectar-liquids, but given her MS, the assessment was limited.  She demonstrated decreased bolus organization, premature spillage of thin liquids with trace aspiration before laryngeal-vestibular closure. Nectars were noted to penetrate the larynx, but were not aspirated.  There was minimal residue post-swallow.  Aspiration was accompanied by a mild cough.  Pt was not alert enough to try strategies/postures  that may have afforded her improved airway protection.  For now, recommend dysphagia 1 diet, nectar-thick liquids; crush meds in puree.  SLP will follow for diet advancement and further investigation into swallow physiology as MS improves.  SLP Visit Diagnosis Dysphagia, oropharyngeal phase (R13.12) Attention and concentration deficit following -- Frontal lobe and executive function deficit following -- Impact on safety and function Moderate aspiration risk;Risk for inadequate nutrition/hydration   CHL IP TREATMENT RECOMMENDATION 11/29/2018 Treatment Recommendations Therapy as outlined in treatment plan below   Prognosis 11/29/2018 Prognosis for Safe Diet Advancement Fair Barriers to Reach Goals -- Barriers/Prognosis Comment -- CHL IP DIET RECOMMENDATION 11/29/2018 SLP Diet Recommendations Dysphagia 1 (Puree) solids;Nectar thick liquid Liquid Administration via Cup Medication Administration Crushed with puree Compensations Slow rate;Small sips/bites Postural Changes --   CHL IP OTHER RECOMMENDATIONS 11/29/2018 Recommended Consults -- Oral Care Recommendations Oral care BID Other Recommendations Order  thickener from pharmacy   CHL IP FOLLOW UP RECOMMENDATIONS 11/05/2018 Follow up Recommendations (No Data)   CHL IP FREQUENCY AND DURATION 11/29/2018 Speech Therapy Frequency (ACUTE ONLY) min 2x/week Treatment Duration 2 weeks      CHL IP ORAL PHASE 11/29/2018 Oral Phase Impaired Oral - Pudding Teaspoon -- Oral - Pudding Cup -- Oral - Honey Teaspoon -- Oral - Honey Cup -- Oral - Nectar Teaspoon -- Oral - Nectar Cup Left anterior bolus loss;Right anterior bolus loss;Weak lingual manipulation;Reduced posterior propulsion;Holding of bolus;Delayed oral transit;Decreased bolus cohesion Oral - Nectar Straw -- Oral - Thin Teaspoon -- Oral - Thin Cup Left anterior bolus loss;Right anterior bolus loss;Reduced posterior propulsion;Holding of bolus;Pocketing in anterior sulcus;Delayed oral transit;Decreased bolus cohesion Oral - Thin Straw -- Oral - Puree Left anterior bolus loss;Right anterior bolus loss;Weak lingual manipulation;Reduced posterior propulsion;Holding of bolus;Delayed oral transit;Decreased bolus cohesion Oral - Mech Soft -- Oral - Regular -- Oral - Multi-Consistency -- Oral - Pill -- Oral Phase - Comment --  CHL IP PHARYNGEAL PHASE 11/29/2018 Pharyngeal Phase Impaired Pharyngeal- Pudding Teaspoon -- Pharyngeal -- Pharyngeal- Pudding Cup -- Pharyngeal -- Pharyngeal- Honey Teaspoon -- Pharyngeal -- Pharyngeal- Honey Cup -- Pharyngeal -- Pharyngeal- Nectar Teaspoon -- Pharyngeal -- Pharyngeal- Nectar Cup Delayed swallow initiation-pyriform sinuses;Reduced pharyngeal peristalsis;Reduced tongue base retraction;Penetration/Aspiration before swallow;Pharyngeal residue - valleculae Pharyngeal Material enters airway, remains ABOVE vocal cords and not ejected out Pharyngeal- Nectar Straw -- Pharyngeal -- Pharyngeal- Thin Teaspoon -- Pharyngeal -- Pharyngeal- Thin Cup Delayed swallow initiation-pyriform sinuses;Reduced airway/laryngeal closure;Penetration/Aspiration before swallow;Trace aspiration Pharyngeal Material  enters airway, passes BELOW cords and not ejected out despite cough attempt by patient Pharyngeal- Thin Straw -- Pharyngeal -- Pharyngeal- Puree Delayed swallow initiation-vallecula;Reduced pharyngeal peristalsis;Pharyngeal residue - valleculae Pharyngeal -- Pharyngeal- Mechanical Soft -- Pharyngeal -- Pharyngeal- Regular -- Pharyngeal -- Pharyngeal- Multi-consistency -- Pharyngeal -- Pharyngeal- Pill -- Pharyngeal -- Pharyngeal Comment --  No flowsheet data found. Juan Quam Laurice 11/29/2018, 1:35 PM                 Scheduled Meds: . aspirin  300 mg Rectal Daily  . clopidogrel  75 mg Oral Daily  . fluticasone furoate-vilanterol  1 puff Inhalation Daily  . insulin aspart  0-5 Units Subcutaneous QHS  . insulin aspart  0-9 Units Subcutaneous TID WC  . metoprolol tartrate  2.5 mg Intravenous Q6H  . Resource ThickenUp Clear   Oral Once  . umeclidinium bromide  1 puff Inhalation Daily   Continuous Infusions:   LOS: 3 days      Time spent: 25 minutes   Dessa Phi, DO  [facility] 11/30/2018, 10:02 AM   Available via Epic secure chat 7am-7pm After these hours, please refer to coverage provider listed on [email or web address]

## 2018-12-01 DIAGNOSIS — I10 Essential (primary) hypertension: Secondary | ICD-10-CM

## 2018-12-01 DIAGNOSIS — J449 Chronic obstructive pulmonary disease, unspecified: Secondary | ICD-10-CM

## 2018-12-01 LAB — COMPREHENSIVE METABOLIC PANEL
ALT: 16 U/L (ref 0–44)
AST: 29 U/L (ref 15–41)
Albumin: 3.3 g/dL — ABNORMAL LOW (ref 3.5–5.0)
Alkaline Phosphatase: 84 U/L (ref 38–126)
Anion gap: 11 (ref 5–15)
BUN: 29 mg/dL — ABNORMAL HIGH (ref 8–23)
CO2: 28 mmol/L (ref 22–32)
Calcium: 11.1 mg/dL — ABNORMAL HIGH (ref 8.9–10.3)
Chloride: 108 mmol/L (ref 98–111)
Creatinine, Ser: 0.97 mg/dL (ref 0.44–1.00)
GFR calc Af Amer: >60 mL/min (ref >60)
GFR calc non Af Amer: 58 mL/min — ABNORMAL LOW (ref >60)
Glucose, Bld: 153 mg/dL — ABNORMAL HIGH (ref 70–99)
Potassium: 3.8 mmol/L (ref 3.5–5.1)
Sodium: 147 mmol/L — ABNORMAL HIGH (ref 135–145)
Total Bilirubin: 0.9 mg/dL (ref 0.3–1.2)
Total Protein: 6.1 g/dL — ABNORMAL LOW (ref 6.5–8.1)

## 2018-12-01 LAB — CBC
HCT: 40.9 % (ref 36.0–46.0)
Hemoglobin: 12.5 g/dL (ref 12.0–15.0)
MCH: 26 pg (ref 26.0–34.0)
MCHC: 30.6 g/dL (ref 30.0–36.0)
MCV: 85 fL (ref 80.0–100.0)
Platelets: 178 10*3/uL (ref 150–400)
RBC: 4.81 MIL/uL (ref 3.87–5.11)
RDW: 16.4 % — ABNORMAL HIGH (ref 11.5–15.5)
WBC: 5 10*3/uL (ref 4.0–10.5)
nRBC: 0 % (ref 0.0–0.2)

## 2018-12-01 LAB — GLUCOSE, CAPILLARY
Glucose-Capillary: 145 mg/dL — ABNORMAL HIGH (ref 70–99)
Glucose-Capillary: 155 mg/dL — ABNORMAL HIGH (ref 70–99)
Glucose-Capillary: 159 mg/dL — ABNORMAL HIGH (ref 70–99)
Glucose-Capillary: 205 mg/dL — ABNORMAL HIGH (ref 70–99)

## 2018-12-01 MED ORDER — SODIUM CHLORIDE 0.9 % IV SOLN
Freq: Once | INTRAVENOUS | Status: AC
  Administered 2018-12-01: 17:00:00 via INTRAVENOUS

## 2018-12-01 MED ORDER — SENNOSIDES-DOCUSATE SODIUM 8.6-50 MG PO TABS
1.0000 | ORAL_TABLET | Freq: Once | ORAL | Status: AC
  Administered 2018-12-01: 1 via ORAL
  Filled 2018-12-01: qty 1

## 2018-12-01 MED ORDER — METOPROLOL TARTRATE 5 MG/5ML IV SOLN
5.0000 mg | Freq: Once | INTRAVENOUS | Status: AC
  Administered 2018-12-01: 5 mg via INTRAVENOUS
  Filled 2018-12-01: qty 5

## 2018-12-01 NOTE — Progress Notes (Signed)
Physical Therapy Treatment Patient Details Name: Denise [name] MRN: [id number] DOB: Dec 10, 1945 Today's Date: 12/01/2018    History of Present Illness Pt is a 73 y/o female admitted with increasing L sided weakness and lethargy. Pt found to have new right thalamic capsular infarct. Pt with recent (11/2)  R cerebellar infarct s/p tPA w/ hemorrhagic transformation, pt also has small infarcts L sided cerebrum. PMH - DM, HTN, colon cancer, COPD, chronic respiratory failure with hypoxia, systolic CHF.    PT Comments    Pt Progressing with therapy today. Mod A for bed mobility. Pt stood and transferred to recliner with RW with min A and assist with LUE on RW. Pt practiced sit<>stand and performed pregait activities in standing with RW but was very fatigued after transfer with SpO2 down to low 80's on 4L so gait deferred. Pt maintain L gaze through much of session, assisted her with R head turn to converse with husband after session and educated them on working on this together. PT will continue to follow   Follow Up Recommendations  CIR     Equipment Recommendations  None recommended by PT    Recommendations for Other Services Rehab consult     Precautions / Restrictions Precautions Precautions: Fall Precaution Comments: monitor SpO2 sats (on 4L of O2 via nasal cannula baseline) Restrictions Weight Bearing Restrictions: No    Mobility  Bed Mobility Overal bed mobility: Needs Assistance Bed Mobility: Supine to Sit     Supine to sit: Mod assist;HOB elevated     General bed mobility comments: increased time needed and vc's for sequencing but only physical assist pt needed was LE's off bed. Had some difficulty maintaining balance with scoot to EOB  Transfers Overall transfer level: Needs assistance Equipment used: Rolling walker (2 wheeled) Transfers: Sit to/from Stand Sit to Stand: Min assist Stand pivot transfers: Min assist       General transfer comment: pt needed  assist placing L hand in a position for it to assist with pushing up. Had difficulty getting it up to armrest of chair when rising from recliner. Min A for power up and increased time needed. Pt also needed assist placing L hand on RW but was able to grasp once hand positioned correctly  Ambulation/Gait             General Gait Details: pt very fatigued after transferring to chair. Took a seated rest break and then stood again and practiced pregait marching and wt shifting but did not walk away from chair. SPO2 decreased to low 80's in standing. O2 increased to 5L and SpO2 returned to low 90's after 3 mins   Stairs             Wheelchair Mobility    Modified Rankin (Stroke Patients Only) Modified Rankin (Stroke Patients Only) Pre-Morbid Rankin Score: Moderately severe disability Modified Rankin: Moderately severe disability     Balance Overall balance assessment: Needs assistance Sitting-balance support: No upper extremity supported;Feet supported Sitting balance-Leahy Scale: Fair     Standing balance support: Bilateral upper extremity supported Standing balance-Leahy Scale: Poor Standing balance comment: L lateral lean with trunk rotation.                            Cognition Arousal/Alertness: Awake/alert Behavior During Therapy: Flat affect Overall Cognitive Status: Impaired/Different from baseline Area of Impairment: Problem solving  Problem Solving: Slow processing;Requires verbal cues General Comments: slow processing and slowed responses verbally but appropriate throughout session      Exercises General Exercises - Lower Extremity Ankle Circles/Pumps: AROM;Both;20 reps;Supine Heel Slides: AROM;Both;10 reps;Supine Hip ABduction/ADduction: AROM;Both;10 reps;Seated Hip Flexion/Marching: AROM;Both;10 reps;Standing    General Comments General comments (skin integrity, edema, etc.): Pt with persistent L gaze  throughout session. Once in chair, cued pt to look to husband on R and pt unable to do so and unaware that she was looking L. Manual facilitation of R cervical rotation was given and pt was able to maintain R gaze to husband. Education given to him on cueing her to attend to R visual field.       Pertinent Vitals/Pain Pain Assessment: No/denies pain Faces Pain Scale: No hurt    Home Living                      Prior Function            PT Goals (current goals can now be found in the care plan section) Acute Rehab PT Goals Patient Stated Goal: to go home PT Goal Formulation: With patient/family Time For Goal Achievement: 12/12/18 Potential to Achieve Goals: Good Progress towards PT goals: Progressing toward goals    Frequency    Min 4X/week      PT Plan Current plan remains appropriate    Co-evaluation              AM-PAC PT "6 Clicks" Mobility   Outcome Measure  Help needed turning from your back to your side while in a flat bed without using bedrails?: A Lot Help needed moving from lying on your back to sitting on the side of a flat bed without using bedrails?: A Lot Help needed moving to and from a bed to a chair (including a wheelchair)?: A Lot Help needed standing up from a chair using your arms (e.g., wheelchair or bedside chair)?: A Lot Help needed to walk in hospital room?: A Lot Help needed climbing 3-5 steps with a railing? : Total 6 Click Score: 11    End of Session Equipment Utilized During Treatment: Oxygen;Gait belt Activity Tolerance: Patient limited by fatigue Patient left: with call bell/phone within reach;with family/visitor present;in chair Nurse Communication: Mobility status PT Visit Diagnosis: Hemiplegia and hemiparesis;Other abnormalities of gait and mobility (R26.89) Hemiplegia - Right/Left: Left Hemiplegia - dominant/non-dominant: Non-dominant Hemiplegia - caused by: Cerebral infarction     Time: 3710-6269 PT Time  Calculation (min) (ACUTE ONLY): 34 min  Charges:  $Gait Training: 8-22 mins $Therapeutic Exercise: 8-22 mins                     Leighton Roach, [place]  Pager [phone] Office [place] 12/01/2018, 2:22 PM

## 2018-12-01 NOTE — Progress Notes (Signed)
[name]  PROGRESS NOTE    Denise [name]  LXB:[id number] DOB: Oct 23, 1945 DOA: 11/27/2018 PCP: [name], NP   Brief Narrative:   Denise [name] a 73 y.o.femalewith medical history significant oftype 2 diabetes not on treatment, hypertension, COPD with chronic hypoxic respiratory failure on 4 L oxygen, pulmonary hypertension who was recently admitted to the hospital with acute ischemic stroke, status post TPA with hemorrhagic conversion treated with cryoprecipitate and tranexamic acid, went to acute inpatient rehab and discharged home on 11/19/2018 comes back to the emergency room with her husband with more than usual weakness on the left side. According to the husband at bedside, when she left the rehab, she was able to eat by herself, she was able to use her left hand. She still had some weakness, however she was able to function and walk with support. This morning, her husband found her hard to arouse, found with increasing speech difficulty and worsening left-sided weakness. Patient also complaining of some shoulder pain and aching. She has some dysarthria. She uses 4 L oxygen at baseline and has no worsening shortness of breath or wheezing. MRI showed new right thalamocapsular stroke with evaluating old hemorrhagic strokes. CTA with no acute occlusion, severe stenosis left vertebral artery origin. Neurology consulted.  11/29: No acute events ON.    Assessment & Plan:   Principal Problem:   Acute ischemic right MCA stroke ([name]) Active Problems:   COPD GOLD  II  spirometry, severe Emphysema and 02 dep   Anxiety and depression   Hyperlipidemia   Essential hypertension   Chronic respiratory failure with hypoxia ([name])   GERD (gastroesophageal reflux disease)   ICH (intracerebral hemorrhage) ([name]) s/p tPA administration   Uncontrolled type 2 diabetes mellitus with hyperglycemia ([name])  Acute ischemic right thalamic stroke, multiple territory stroke with recent  ischemic stroke     - MRI brain acute infarct centered within the right thalamocapsular junction      - CTA head/neck no large vessel occlusion. No significant stenosis at the ICA origins     - Neurology consulted: continue aspirin, plavix for 3 weeks then aspirin alone; continue statin at discharge     - PT/OT recommending CIR placement; has been reviewed by inpt rehab, was not appropriate at the time, continuing to w/u PT/OT  COPD with chronic hypoxic failure on 4 L oxygen at baseline     - not in exacerbation; continue inahlers/O2 support  Essential hypertension     - getting metoprolol IV now; transition to PO metoprolol in AM; start with 12.5 mg BID     - per neurology: d/t high grade left VA stenosis, want to avoid hypotension  Type 2 diabetes     - recent A1c 7     - continue sliding scale insulin  Prolonged QTc     - repeat EKG with improvement in QTC     - monitor lytes  HLD     - resume lipitor at discharge  Hypercalcemia     - fluids; add NS, monitor     - check PTH  DVT prophylaxis: SCDs Code Status: FULL   Disposition Plan: TBD  Consultants:   Neurology   Subjective: No acute events ON.   Objective: Vitals:   12/01/18 0457 12/01/18 0854 12/01/18 0942 12/01/18 1258  BP: (!) 138/97  120/81 120/75  Pulse:  70  70  Resp: _0 Temp:    (!) 97.5 F (36.4 C)  TempSrc:  Oral  SpO2: 100% 94%  97%  Weight:      Height:        Intake/Output Summary (Last 24 hours) at 12/01/2018 1538 Last data filed at 12/01/2018 0900 Gross per 24 hour  Intake 150 ml  Output 960 ml  Net -810 ml   Filed Weights   11/27/18 1241 11/28/18 0127 12/01/18 0407  Weight: 42.6 kg 43.5 kg 43.8 kg    Examination:  General: 73 y.o. female resting in bed in NAD Cardiovascular: RRR, +S1, S2, no m/g/r, equal pulses throughout Respiratory: CTABL, no w/r/r, normal WOB GI: BS+, NDNT, no masses noted, no organomegaly noted MSK: No e/c/c Neuro: somnolent   Data  Reviewed: I have personally reviewed following labs and imaging studies.  CBC: Recent Labs  Lab 11/27/18 1333 12/01/18 0758  WBC 4.0 5.0  NEUTROABS 2.5  --   HGB 13.5 12.5  HCT 45.5 40.9  MCV 86.8 85.0  PLT 226 409   Basic Metabolic Panel: Recent Labs  Lab 11/27/18 1333 11/28/18 1237 11/29/18 0747 12/01/18 0758  NA 145  --  146* 147*  K 4.6 4.5 4.2 3.8  CL 104  --  106 108  CO2 33*  --  29 28  GLUCOSE 116*  --  148* 153*  BUN 21  --  25* 29*  CREATININE 1.17*  --  1.16* 0.97  CALCIUM 11.7*  --  11.7* 11.1*  MG  --  1.9  --   --    GFR: Estimated Creatinine Clearance: 35.7 mL/min (by C-G formula based on SCr of 0.97 mg/dL). Liver Function Tests: Recent Labs  Lab 11/27/18 1333 12/01/18 0758  AST 34 29  ALT 18 16  ALKPHOS 108 84  BILITOT 1.0 0.9  PROT 7.8 6.1*  ALBUMIN 4.5 3.3*   No results for input(s): LIPASE, AMYLASE in the last 168 hours. No results for input(s): AMMONIA in the last 168 hours. Coagulation Profile: No results for input(s): INR, PROTIME in the last 168 hours. Cardiac Enzymes: No results for input(s): CKTOTAL, CKMB, CKMBINDEX, TROPONINI in the last 168 hours. BNP (last 3 results) Recent Labs    03/01/18 1218 05/02/18 1249 06/24/18 1111  PROBNP 1,336.0* 910.0* 840.0*   HbA1C: No results for input(s): HGBA1C in the last 72 hours. CBG: Recent Labs  Lab 11/30/18 1223 11/30/18 1650 11/30/18 2208 12/01/18 0804 12/01/18 1201  GLUCAP 131* 177* 103* 145* 155*   Lipid Profile: No results for input(s): CHOL, HDL, LDLCALC, TRIG, CHOLHDL, LDLDIRECT in the last 72 hours. Thyroid Function Tests: No results for input(s): TSH, T4TOTAL, FREET4, T3FREE, THYROIDAB in the last 72 hours. Anemia Panel: No results for input(s): VITAMINB12, FOLATE, FERRITIN, TIBC, IRON, RETICCTPCT in the last 72 hours. Sepsis Labs: No results for input(s): PROCALCITON, LATICACIDVEN in the last 168 hours.  Recent Results (from the past 240 hour(s))  SARS  CORONAVIRUS 2 (TAT 6-24 HRS) Nasopharyngeal Nasopharyngeal Swab     Status: None   Collection Time: 11/27/18  2:47 PM   Specimen: Nasopharyngeal Swab  Result Value Ref Range Status   SARS Coronavirus 2 NEGATIVE NEGATIVE Final    Comment: (NOTE) SARS-CoV-2 target nucleic acids are NOT DETECTED. The SARS-CoV-2 RNA is generally detectable in upper and lower respiratory specimens during the acute phase of infection. Negative results do not preclude SARS-CoV-2 infection, do not rule out co-infections with other pathogens, and should not be used as the sole basis for treatment or other patient management decisions. Negative results must be combined with clinical  observations, patient history, and epidemiological information. The expected result is Negative. Fact Sheet for Patients: SugarRoll.be Fact Sheet for Healthcare Providers: https://www.woods-mathews.com/ This test is not yet approved or cleared by the [place] FDA and  has been authorized for detection and/or diagnosis of SARS-CoV-2 by FDA under an Emergency Use Authorization (EUA). This EUA will remain  in effect (meaning this test can be used) for the duration of the COVID-19 declaration under Section 56 4(b)(1) of the Act, 21 U.S.C. section 360bbb-3(b)(1), unless the authorization is terminated or revoked sooner. Performed at [facility], [place] [address] [place], [place] [address]       Radiology Studies: No results found.   Scheduled Meds: . aspirin  300 mg Rectal Daily  . clopidogrel  75 mg Oral Daily  . fluticasone furoate-vilanterol  1 puff Inhalation Daily  . insulin aspart  0-5 Units Subcutaneous QHS  . insulin aspart  0-9 Units Subcutaneous TID WC  . mouth rinse  15 mL Mouth Rinse BID  . metoprolol tartrate  2.5 mg Intravenous Q6H  . Resource ThickenUp Clear   Oral Once  . umeclidinium bromide  1 puff Inhalation Daily   Continuous Infusions:   LOS: 4 days     Time spent: 25 minutes spent in the coordination of care today.    Jonnie Finner, DO [facility] Pager [phone]  If 7PM-7AM, please contact night-coverage [email or web address] Password [email or web address] 12/01/2018, 3:38 PM

## 2018-12-01 NOTE — Progress Notes (Signed)
   12/01/18 0007  Vitals  Temp 98.2 F (36.8 C)  BP 117/80  MAP (mmHg) 90  BP Location Right Arm  BP Method Automatic  Patient Position (if appropriate) Lying  Pulse Rate 73  Pulse Rate Source Monitor  Resp 16  Oxygen Therapy  SpO2 100 %  O2 Device Nasal Cannula  MEWS Score  MEWS RR 0  MEWS Pulse 0  MEWS Systolic 0  MEWS LOC 0  MEWS Temp 0  MEWS Score 0  MEWS Score Color Green   Per tele, heart rhythm changed from NSR with PACs and PVCs to A-fib/A-flutter. HR 90s-100s. Patient resting in bed, asymptomatic. Blount, NP notified. Metoprolol 56m IV ordered x1 dose. Will continue to monitor.

## 2018-12-01 NOTE — Progress Notes (Signed)
Patient requested bedpan for BM. Patient has had Purewick in place all night with only 50 ml output. Patient urinated 125 ml of dark, cloudy, foul smelling urine. Bladder scan showed 385 ml. Blount, NP notified of this information and that patient has not had BM documented since admission. In and out cath with urine culture collection and [place] x1 ordered. Will continue to monitor.

## 2018-12-01 NOTE — Progress Notes (Signed)
Per tele, patient heart rhythm has converted back to NSR. Will continue to monitor.

## 2018-12-02 ENCOUNTER — Inpatient Hospital Stay (HOSPITAL_COMMUNITY): Payer: Medicare HMO

## 2018-12-02 ENCOUNTER — Encounter: Payer: Medicare HMO | Admitting: Physical Medicine and Rehabilitation

## 2018-12-02 ENCOUNTER — Encounter: Payer: Self-pay | Admitting: Internal Medicine

## 2018-12-02 DIAGNOSIS — E876 Hypokalemia: Secondary | ICD-10-CM

## 2018-12-02 DIAGNOSIS — E785 Hyperlipidemia, unspecified: Secondary | ICD-10-CM

## 2018-12-02 DIAGNOSIS — E1165 Type 2 diabetes mellitus with hyperglycemia: Secondary | ICD-10-CM

## 2018-12-02 DIAGNOSIS — K219 Gastro-esophageal reflux disease without esophagitis: Secondary | ICD-10-CM

## 2018-12-02 DIAGNOSIS — E870 Hyperosmolality and hypernatremia: Secondary | ICD-10-CM

## 2018-12-02 LAB — RENAL FUNCTION PANEL
Albumin: 3.2 g/dL — ABNORMAL LOW (ref 3.5–5.0)
Anion gap: 12 (ref 5–15)
BUN: 33 mg/dL — ABNORMAL HIGH (ref 8–23)
CO2: 28 mmol/L (ref 22–32)
Calcium: 10.5 mg/dL — ABNORMAL HIGH (ref 8.9–10.3)
Chloride: 109 mmol/L (ref 98–111)
Creatinine, Ser: 0.87 mg/dL (ref 0.44–1.00)
GFR calc Af Amer: >60 mL/min (ref >60)
GFR calc non Af Amer: >60 mL/min (ref >60)
Glucose, Bld: 71 mg/dL (ref 70–99)
Phosphorus: 3.1 mg/dL (ref 2.5–4.6)
Potassium: 3.4 mmol/L — ABNORMAL LOW (ref 3.5–5.1)
Sodium: 149 mmol/L — ABNORMAL HIGH (ref 135–145)

## 2018-12-02 LAB — CBC WITH DIFFERENTIAL/PLATELET
Abs Immature Granulocytes: 0.01 10*3/uL (ref 0.00–0.07)
Basophils Absolute: 0.1 10*3/uL (ref 0.0–0.1)
Basophils Relative: 1 %
Eosinophils Absolute: 0.1 10*3/uL (ref 0.0–0.5)
Eosinophils Relative: 3 %
HCT: 36.7 % (ref 36.0–46.0)
Hemoglobin: 11.4 g/dL — ABNORMAL LOW (ref 12.0–15.0)
Immature Granulocytes: 0 %
Lymphocytes Relative: 21 %
Lymphs Abs: 0.8 10*3/uL (ref 0.7–4.0)
MCH: 26.3 pg (ref 26.0–34.0)
MCHC: 31.1 g/dL (ref 30.0–36.0)
MCV: 84.8 fL (ref 80.0–100.0)
Monocytes Absolute: 0.4 10*3/uL (ref 0.1–1.0)
Monocytes Relative: 10 %
Neutro Abs: 2.3 10*3/uL (ref 1.7–7.7)
Neutrophils Relative %: 65 %
Platelets: 192 10*3/uL (ref 150–400)
RBC: 4.33 MIL/uL (ref 3.87–5.11)
RDW: 16.3 % — ABNORMAL HIGH (ref 11.5–15.5)
WBC: 3.7 10*3/uL — ABNORMAL LOW (ref 4.0–10.5)
nRBC: 0 % (ref 0.0–0.2)

## 2018-12-02 LAB — GLUCOSE, CAPILLARY
Glucose-Capillary: 112 mg/dL — ABNORMAL HIGH (ref 70–99)
Glucose-Capillary: 123 mg/dL — ABNORMAL HIGH (ref 70–99)
Glucose-Capillary: 135 mg/dL — ABNORMAL HIGH (ref 70–99)
Glucose-Capillary: 158 mg/dL — ABNORMAL HIGH (ref 70–99)
Glucose-Capillary: 160 mg/dL — ABNORMAL HIGH (ref 70–99)

## 2018-12-02 LAB — MAGNESIUM: Magnesium: 1.9 mg/dL (ref 1.7–2.4)

## 2018-12-02 MED ORDER — SODIUM CHLORIDE 0.45 % IV SOLN
Freq: Once | INTRAVENOUS | Status: AC
  Administered 2018-12-02: 10:00:00 via INTRAVENOUS

## 2018-12-02 MED ORDER — METOPROLOL TARTRATE 12.5 MG HALF TABLET
12.5000 mg | ORAL_TABLET | Freq: Two times a day (BID) | ORAL | Status: DC
  Administered 2018-12-02 – 2018-12-06 (×9): 12.5 mg via ORAL
  Filled 2018-12-02 (×9): qty 1

## 2018-12-02 MED ORDER — POTASSIUM CHLORIDE CRYS ER 20 MEQ PO TBCR
40.0000 meq | EXTENDED_RELEASE_TABLET | Freq: Every day | ORAL | Status: DC
  Administered 2018-12-02 – 2018-12-06 (×5): 40 meq via ORAL
  Filled 2018-12-02 (×5): qty 2

## 2018-12-02 NOTE — Progress Notes (Signed)
Inpatient Rehabilitation-Admissions Coordinator   Spoke with PT after treatment session this AM. At this time, pt would not be able to tolerate intensive level therapies. Recommendation has been switched to SNF. I discussed this with pt and her husband bedside. They verbalized an understanding behind this change in recommendation. I have notified TOC team.   AC will sign off.   Jhonnie Garner, OTR/L  Rehab Admissions Coordinator  [phone] 12/02/2018 12:04 PM

## 2018-12-02 NOTE — Progress Notes (Signed)
Initial Nutrition Assessment  DOCUMENTATION CODES:   Underweight, Severe malnutrition in context of chronic illness  INTERVENTION:   - Vital Cuisine Shake daily at breakfast, each supplement provides 520 kcal and 22 grams of protein  - Magic Cup BID with lunch and dinner meals, each supplement provides 290 kcal and 9 grams of protein  - Encourage adequate PO intake  NUTRITION DIAGNOSIS:   Severe Malnutrition related to chronic illness (COPD, CHF) as evidenced by severe fat depletion, severe muscle depletion.  GOAL:   Patient will meet greater than or equal to 90% of their needs  MONITOR:   PO intake, Supplement acceptance, Diet advancement, Labs, Weight trends, Skin, I & O's  REASON FOR ASSESSMENT:   Other (underweight BMI)    ASSESSMENT:   73 year old female with PMH of T2DM, COPD, CHF, stroke, HTN. Pt presented to the ED on 11/25 with AMS. Pt was recently admitted to the hospital with acute ischemic stroke s/p TPA with hemorrhagic conversion and went to CIR for rehab. Pt admitted with acute ischemic right MCA stroke.   11/27 - MBS with recommendations for Dysphagia 1 diet with nectar-thick liquids  Therapies recommending SNF.  Spoke with pt and husband briefly. Pt requesting assistance in using the bathroom. Call bell utilized and RN came to assist pt. Upon RD return to pt's room, pt was still using bedside commode.  This RD is familiar with pt from previous admissions. On previous admissions, pt reported consuming 2-3 meals daily and drinking 1-2 Boost supplements daily.  Pt has reported her UBW as 130-135 lbs and that she last weighed this 3 years ago.  Reviewed weight history in chart. Pt with a 5.2 kg weight loss since 10/22/18. This is a 10.6% weight loss in less than 2 months which is severe and significant for timeframe. Suspect some of weight fluctuations can be attributed to fluid status but also suspect significant true dry weight loss.  Given current diet  order, RD will order nectar-thick oral nutrition supplements to aid pt in meeting kcal and protein needs during admission.  Meal Completion: 0-50% x last 7 recorded meals  Medications reviewed and include: SSI, Klor-con 40 mEq daily  Labs reviewed: sodium 149, potassium 3.4, BUN 33 CBG's: 123-205 x 24 hours  UOP: 466 ml x 24 hours  NUTRITION - FOCUSED PHYSICAL EXAM:    Most Recent Value  Orbital Region  Severe depletion  Upper Arm Region  Severe depletion  Thoracic and Lumbar Region  Severe depletion  Buccal Region  Severe depletion  Temple Region  Severe depletion  Clavicle Bone Region  Severe depletion  Clavicle and Acromion Bone Region  Severe depletion  Scapular Bone Region  Unable to assess  Dorsal Hand  Severe depletion  Patellar Region  Severe depletion  Anterior Thigh Region  Severe depletion  Posterior Calf Region  Severe depletion  Edema (RD Assessment)  None  Hair  Reviewed  Eyes  Reviewed  Mouth  Reviewed  Skin  Reviewed  Nails  Reviewed       Diet Order:   Diet Order            DIET - DYS 1 Room service appropriate? No; Fluid consistency: Nectar Thick  Diet effective now              EDUCATION NEEDS:   Education needs have been addressed  Skin:  Skin Assessment: Skin Integrity Issues: Skin Integrity Issues: Incisions: left chest  Last BM:  12/02/18 type 3  Height:  Ht Readings from Last 1 Encounters:  11/28/18 _0  (1.651 m)    Weight:   Wt Readings from Last 1 Encounters:  12/01/18 43.8 kg    Ideal Body Weight:  56.8 kg  BMI:  Body mass index is 16.07 kg/m.  Estimated Nutritional Needs:   Kcal:  1350-1550  Protein:  65-80 grams  Fluid:  1.4-1.6 L    Gaynell Face, MS, RD, LDN Inpatient Clinical Dietitian Pager: [phone] Weekend/After Hours: [phone]

## 2018-12-02 NOTE — NC FL2 (Addendum)
[place] MEDICAID FL2 LEVEL OF CARE SCREENING TOOL     IDENTIFICATION  Patient Name: CALIANN LECKRONE Birthdate: 1945/04/04 Sex: female Admission Date (Current Location): 11/27/2018  [facility] and [place] Number:  Herbalist and Address:  [facility], [place] [address] [place], [place] [address]      Provider Number: [id number]  Attending Physician Name and Address:  Jonnie Finner, DO  Relative Name and Phone Number:  Allena Pietila (FYBOFB)[phone]    Current Level of Care: Hospital Recommended Level of Care: [place] Prior Approval Number:    Date Approved/Denied:   PASRR Number:   [id number]  Discharge Plan: SNF    Current Diagnoses: Patient Active Problem List   Diagnosis Date Noted  . Acute ischemic right MCA stroke ([place]) 11/27/2018  . Vaginal candidiasis   . Acute blood loss anemia   . Supplemental oxygen dependent   . Uncontrolled type 2 diabetes mellitus with hyperglycemia ([place])   . ICH (intracerebral hemorrhage) ([place]) s/p tPA administration 11/08/2018  . Hypertensive urgency 11/08/2018  . Cerebellar stroke, acute ([place]) 11/08/2018  . History of ischemic stroke 11/04/2018  . COPD exacerbation ([place]) 10/28/2018  . Acute on chronic systolic CHF (congestive heart failure) ([place]) 10/28/2018  . Elevated troponin 10/28/2018  . Lactic acidosis 10/28/2018  . GERD (gastroesophageal reflux disease) 10/28/2018  . Acute on chronic respiratory failure with hypercapnia ([place]) 10/28/2018  . Diabetes mellitus without complication-diet controled 10/28/2018  . On home oxygen therapy 07/12/2018  . CHF (congestive heart failure) ([place]) 05/16/2018  . Chronic respiratory failure with hypoxia and hypercapnia ([place]) 05/03/2018  . Borderline abnormal TFTs 05/02/2018  . Type 2 diabetes mellitus ([place]) 04/26/2018  . Unintended weight loss 04/26/2018  . Lower extremity edema 02/20/2018  . Chronic respiratory failure with  hypoxia ([place]) 10/02/2017  . Hematuria 09/27/2017  . Pulmonary hypertension ([name]) c/w cor pulmonale   . Protein-calorie malnutrition, severe 09/17/2017  . Constipation   . Hyperparathyroidism, primary ([place]) 07/27/2016  . Loss of appetite 06/02/2016  . COPD GOLD  II  spirometry, severe Emphysema and 02 dep 11/22/2015  . Vitamin D deficiency 11/22/2015  . Anxiety and depression 11/22/2015  . Insomnia 11/22/2015  . Hyperlipidemia 11/22/2015  . Essential hypertension 11/22/2015  . Hx of colonic polyps 02/10/2013  . History of colon cancer 06/11/1986    Orientation RESPIRATION BLADDER Height & Weight     Self, Time, Situation, Place  O2(4L [place], refer to d/c summary) Incontinent Weight: 43.8 kg Height:  5' 5" (165.1 cm)  BEHAVIORAL SYMPTOMS/MOOD NEUROLOGICAL BOWEL NUTRITION STATUS      Continent Diet(refer to d/c summary)  AMBULATORY STATUS COMMUNICATION OF NEEDS Skin   Extensive Assist Verbally Normal                       Personal Care Assistance Level of Assistance  Bathing, Feeding, Dressing Bathing Assistance: Maximum assistance Feeding assistance: Limited assistance Dressing Assistance: Maximum assistance     Functional Limitations Info  Sight, Hearing, Speech Sight Info: Adequate Hearing Info: Adequate Speech Info: Adequate    SPECIAL CARE FACTORS FREQUENCY  PT (By licensed PT), OT (By licensed OT)     PT Frequency: 5x / week OT Frequency: 5x /week            Contractures Contractures Info: Not present    Additional Factors Info  Code Status, Allergies, Insulin Sliding Scale Code Status Info: Full Code Allergies Info: NKDA   Insulin  Sliding Scale Info: Novolog scliding scale with meal and hs, refer to d/c summary       Current Medications (12/02/2018):  This is the current hospital active medication list Current Facility-Administered Medications  Medication Dose Route Frequency Provider Last Rate Last Dose  . albuterol (PROVENTIL) (2.5 MG/3ML)  0.083% nebulizer solution 3 mL  3 mL Inhalation Q4H PRN Barb Merino, MD      . aspirin suppository 300 mg  300 mg Rectal Daily Dessa Phi, DO   300 mg at 12/02/18 0908  . clopidogrel (PLAVIX) tablet 75 mg  75 mg Oral Daily Burnetta Sabin L, NP   75 mg at 12/02/18 0908  . fluticasone furoate-vilanterol (BREO ELLIPTA) 200-25 MCG/INH 1 puff  1 puff Inhalation Daily Barb Merino, MD   1 puff at 12/01/18 0854  . insulin aspart (novoLOG) injection 0-5 Units  0-5 Units Subcutaneous QHS Barb Merino, MD   2 Units at 12/01/18 2300  . insulin aspart (novoLOG) injection 0-9 Units  0-9 Units Subcutaneous TID WC Barb Merino, MD   1 Units at 12/02/18 1240  . MEDLINE mouth rinse  15 mL Mouth Rinse BID Dessa Phi, DO   15 mL at 12/02/18 1045  . metoprolol tartrate (LOPRESSOR) tablet 12.5 mg  12.5 mg Oral BID Marylyn Ishihara, Tyrone A, DO      . potassium chloride SA (KLOR-CON) CR tablet 40 mEq  40 mEq Oral Daily Kyle, Tyrone A, DO   40 mEq at 12/02/18 0908  . Resource ThickenUp Clear   Oral Once Dessa Phi, DO      . umeclidinium bromide (INCRUSE ELLIPTA) 62.5 MCG/INH 1 puff  1 puff Inhalation Daily Barb Merino, MD   1 puff at 12/01/18 0854     Discharge Medications: Please see discharge summary for a list of discharge medications.  Relevant Imaging Results:  Relevant Lab Results:   Additional Information SS# [id number]  Sharin Mons, RN

## 2018-12-02 NOTE — Plan of Care (Signed)
Called by Dr. Marylyn Ishihara that pt was found to have PAF on loop recorder. Currently pt on DAPT for stroke prevention. Repeat CT today showed Stable right cerebellum PICA infarct with petechial hemorrhage, no malignant hemorrhagic transformation or mass effect. She had right cerebellar hemorrhagic conversion on 11/05/18. Now is 4 weeks out and hemorrhagic conversion much resolved. OK to start anticoagulation from neuro standpoint. Recommend pharmacy consult regarding DOAC (e.g. eliquis) dosing for stroke prevention. Once DOAC started, ASA and plavix can be discontinued. Pt will follow up with stroke clinic in 4 weeks (order placed).   Rosalin Hawking, MD PhD Stroke Neurology 12/02/2018 10:46 PM

## 2018-12-02 NOTE — Progress Notes (Signed)
LINQ alert received for AF episodes. Episode from 12/01/18 at 00:33 convincing for MAT and PAF per Dr. Lovena Le, duration 79mn. AKristin Bruins PA, made aware as patient is currently hospitalized.

## 2018-12-02 NOTE — Progress Notes (Signed)
SLP Cancellation Note  Patient Details Name: Denise [name] MRN: [id number] DOB: 1945/09/25   Cancelled treatment:   Pt required assistance to transfer to [facility] upon arrival.  Her nurse reports limited appetite but no difficulty with swallowing during breakfast.  Pt much more alert and participatory than when last seen on Friday for MBS.  Will continue efforts and return this afternoon if schedule permits.  Denise [name], Denise [name] Office number [phone] Pager [phone]         Denise [name] 12/02/2018, 11:06 AM

## 2018-12-02 NOTE — Progress Notes (Addendum)
[name]  PROGRESS NOTE    Denise [name]  QIW:[id number] DOB: 06/25/1945 DOA: 11/27/2018 PCP: [name], NP   Brief Narrative:   Denise [name] a 73 y.o.femalewith medical history significant oftype 2 diabetes not on treatment, hypertension, COPD with chronic hypoxic respiratory failure on 4 L oxygen, pulmonary hypertension who was recently admitted to the hospital with acute ischemic stroke, status post TPA with hemorrhagic conversion treated with cryoprecipitate and tranexamic acid, went to acute inpatient rehab and discharged home on 11/19/2018 comes back to the emergency room with her husband with more than usual weakness on the left side. According to the husband at bedside, when she left the rehab, she was able to eat by herself, she was able to use her left hand. She still had some weakness, however she was able to function and walk with support. This morning, her husband found her hard to arouse, found with increasing speech difficulty and worsening left-sided weakness. Patient also complaining of some shoulder pain and aching. She has some dysarthria. She uses 4 L oxygen at baseline and has no worsening shortness of breath or wheezing. MRI showed new right thalamocapsular stroke with evaluating old hemorrhagic strokes. CTA with no acute occlusion, severe stenosis left vertebral artery origin. Neurology consulted.  11/30: More alert today, but says she is tired. Talked about her working with PT today.    Notified by EP that a fib noted on loop recorder. Qualifies for anticoagulation. Spoke with neurology. Repeat CTH. If stable, then ok to start anticoagulation. Recommend eliquis or xarelto.   Assessment & Plan:   Principal Problem:   Acute ischemic right MCA stroke ([name]) Active Problems:   COPD GOLD  II  spirometry, severe Emphysema and 02 dep   Anxiety and depression   Hyperlipidemia   Essential hypertension   Chronic respiratory failure with hypoxia  ([name])   GERD (gastroesophageal reflux disease)   ICH (intracerebral hemorrhage) ([name]) s/p tPA administration   Uncontrolled type 2 diabetes mellitus with hyperglycemia ([name])  Acute ischemic right thalamic stroke, multiple territory stroke with recent ischemic stroke     - MRI brain acute infarct centered within the right thalamocapsular junction      - CTA head/neck no large vessel occlusion. No significant stenosis at the ICA origins     - Neurology consulted: continue aspirin, plavix for 3 weeks then aspirin alone; continue statin at discharge     - PT/OT recommending CIR placement; has been reviewed by inpt rehab, was not appropriate at the time, continuing to w/u PT/OT     - 11/30: PT rec SNF; will work with CM on options.   COPD with chronic hypoxic failure on 4 L oxygen at baseline     - not in exacerbation; continue inahlers/O2 support     - 11/30: denies complaints today  Essential hypertension     - getting metoprolol IV now; transition to PO metoprolol in AM; start with 12.5 mg BID     - per neurology: d/t high grade left VA stenosis, want to avoid hypotension  Type 2 diabetes     - recent A1c 7     - continue sliding scale insulin  Prolonged QTc     - repeat EKG with improvement in QTC     - monitor lytes  HLD     - resume lipitor at discharge  Hypercalcemia Hypernatremia     - fluids; add NS, monitor     - check PTH     -  11/30: Ca2+ improved today after fluids; will continue fluids today; Na+ is up today, will add 1/2 NS  Hypokalemia     - add PO K+; monitor  A fib     - currently on metoprolol 2.5 mg IV q6h; change to 12.11m PO BID.     - qualifies for anticoagulation; [facility]    DVT prophylaxis: SCDs Code Status: FULL   Disposition Plan: TBD  Consultants:   Neurology  Subjective: "I just woke up. I don't know."  Objective: Vitals:   12/02/18 0024 12/02/18 0359 12/02/18 0412 12/02/18 0500  BP:    128/75   Pulse: 60 70    Resp: (!) 24     Temp:  98.7 F (37.1 C) 97.6 F (36.4 C)   TempSrc:  Oral Axillary   SpO2:  99%  100%  Weight:      Height:        Intake/Output Summary (Last 24 hours) at 12/02/2018 [id number] data filed at 12/02/2018 0400 Gross per 24 hour  Intake 307.16 ml  Output 66 ml  Net 241.16 ml   Filed Weights   11/27/18 1241 11/28/18 0127 12/01/18 0407  Weight: 42.6 kg 43.5 kg 43.8 kg    Examination:  General: 73y.o. female resting in bed in NAD Cardiovascular: RRR, +S1, S2, no m/g/r Respiratory: CTABL, no w/r/r GI: BS+, NDNT, soft MSK: No e/c/c Neuro: alert to name, follows commands Psyc:  calm/cooperative   Data Reviewed: I have personally reviewed following labs and imaging studies.  CBC: Recent Labs  Lab 11/27/18 1333 12/01/18 0758 12/02/18 0256  WBC 4.0 5.0 3.7*  NEUTROABS 2.5  --  2.3  HGB 13.5 12.5 11.4*  HCT 45.5 40.9 36.7  MCV 86.8 85.0 84.8  PLT 226 178 1342  Basic Metabolic Panel: Recent Labs  Lab 11/27/18 1333 11/28/18 1237 11/29/18 0747 12/01/18 0758 12/02/18 0256  NA 145  --  146* 147* 149*  K 4.6 4.5 4.2 3.8 3.4*  CL 104  --  106 108 109  CO2 33*  --  _0 GLUCOSE 116*  --  148* 153* 71  BUN 21  --  25* 29* 33*  CREATININE 1.17*  --  1.16* 0.97 0.87  CALCIUM 11.7*  --  11.7* 11.1* 10.5*  MG  --  1.9  --   --  1.9  PHOS  --   --   --   --  3.1   GFR: Estimated Creatinine Clearance: 39.8 mL/min (by C-G formula based on SCr of 0.87 mg/dL). Liver Function Tests: Recent Labs  Lab 11/27/18 1333 12/01/18 0758 12/02/18 0256  AST 34 29  --   ALT 18 16  --   ALKPHOS 108 84  --   BILITOT 1.0 0.9  --   PROT 7.8 6.1*  --   ALBUMIN 4.5 3.3* 3.2*   No results for input(s): LIPASE, AMYLASE in the last 168 hours. No results for input(s): AMMONIA in the last 168 hours. Coagulation Profile: No results for input(s): INR, PROTIME in the last 168 hours. Cardiac Enzymes: No results for input(s): CKTOTAL, CKMB, CKMBINDEX,  TROPONINI in the last 168 hours. BNP (last 3 results) Recent Labs    03/01/18 1218 05/02/18 1249 06/24/18 1111  PROBNP 1,336.0* 910.0* 840.0*   HbA1C: No results for input(s): HGBA1C in the last 72 hours. CBG: Recent Labs  Lab 12/01/18 0804 12/01/18 1201 12/01/18 1655 12/01/18 2132 12/02/18 0021  GLUCAP  145* 155* 159* 205* 158*   Lipid Profile: No results for input(s): CHOL, HDL, LDLCALC, TRIG, CHOLHDL, LDLDIRECT in the last 72 hours. Thyroid Function Tests: No results for input(s): TSH, T4TOTAL, FREET4, T3FREE, THYROIDAB in the last 72 hours. Anemia Panel: No results for input(s): VITAMINB12, FOLATE, FERRITIN, TIBC, IRON, RETICCTPCT in the last 72 hours. Sepsis Labs: No results for input(s): PROCALCITON, LATICACIDVEN in the last 168 hours.  Recent Results (from the past 240 hour(s))  SARS CORONAVIRUS 2 (TAT 6-24 HRS) Nasopharyngeal Nasopharyngeal Swab     Status: None   Collection Time: 11/27/18  2:47 PM   Specimen: Nasopharyngeal Swab  Result Value Ref Range Status   SARS Coronavirus 2 NEGATIVE NEGATIVE Final    Comment: (NOTE) SARS-CoV-2 target nucleic acids are NOT DETECTED. The SARS-CoV-2 RNA is generally detectable in upper and lower respiratory specimens during the acute phase of infection. Negative results do not preclude SARS-CoV-2 infection, do not rule out co-infections with other pathogens, and should not be used as the sole basis for treatment or other patient management decisions. Negative results must be combined with clinical observations, patient history, and epidemiological information. The expected result is Negative. Fact Sheet for Patients: SugarRoll.be Fact Sheet for Healthcare Providers: https://www.woods-mathews.com/ This test is not yet approved or cleared by the [place] FDA and  has been authorized for detection and/or diagnosis of SARS-CoV-2 by FDA under an Emergency Use Authorization (EUA).  This EUA will remain  in effect (meaning this test can be used) for the duration of the COVID-19 declaration under Section 56 4(b)(1) of the Act, 21 U.S.C. section 360bbb-3(b)(1), unless the authorization is terminated or revoked sooner. Performed at [facility], [place] [address] [place], [place] [address]       Radiology Studies: No results found.   Scheduled Meds: . aspirin  300 mg Rectal Daily  . clopidogrel  75 mg Oral Daily  . fluticasone furoate-vilanterol  1 puff Inhalation Daily  . insulin aspart  0-5 Units Subcutaneous QHS  . insulin aspart  0-9 Units Subcutaneous TID WC  . mouth rinse  15 mL Mouth Rinse BID  . metoprolol tartrate  2.5 mg Intravenous Q6H  . Resource ThickenUp Clear   Oral Once  . umeclidinium bromide  1 puff Inhalation Daily   Continuous Infusions: . sodium chloride       LOS: 5 days    Time spent: 25 minutes spent in the coordination of care today.    Jonnie Finner, DO [facility] Pager [phone]  If 7PM-7AM, please contact night-coverage [email or web address] Password [facility] 12/02/2018, 7:38 AM

## 2018-12-02 NOTE — Progress Notes (Signed)
Physical Therapy Treatment Patient Details Name: Denise [name] MRN: [id number] DOB: [birth year] Today's Date: 12/02/2018    History of Present Illness Pt is a 73 y/o female admitted with increasing L sided weakness and lethargy. Pt found to have new right thalamic capsular infarct. Pt with recent (11/2)  R cerebellar infarct s/p tPA w/ hemorrhagic transformation, pt also has small infarcts L sided cerebrum. PMH - DM, HTN, colon cancer, COPD, chronic respiratory failure with hypoxia, systolic CHF.    PT Comments    Pt limited this session secondary to fatigue, poor activity tolerance and decreased cardiovascular endurance. Pt tolerated exercises for LEs while seated in recliner including ankle pumps, marches and LAQ with encouragement from therapist. Pt performed sit<>stand with RW and min assist, unable to perform gait this session secondary to fatigue. Discussed change in follow up recommendation as patient would not be able to tolerate multiple hours of therapy treatment per day secondary to fatigue and limited activity tolerance. Recommend continued therapy services acutely to address impairments in mobility and strength. Recommend SNF level follow up therapies in order to maximize independence with functional mobility.  Follow Up Recommendations  SNF     Equipment Recommendations  None recommended by PT    Recommendations for Other Services       Precautions / Restrictions Precautions Precautions: Fall Precaution Comments: monitor SpO2 sats (on 4L of O2 via nasal cannula baseline) Restrictions Weight Bearing Restrictions: No    Mobility  Bed Mobility                  Transfers Overall transfer level: Needs assistance Equipment used: Rolling walker (2 wheeled) Transfers: Sit to/from Stand Sit to Stand: Min assist         General transfer comment: Pt requiring encouragement for sit<>stand this session from recliner secondary to fatigue, performed sit<>stand  with min assist and cues for techniques/hand placement on RW. During static standing activity SpO2 96% on 4L O2 min  Ambulation/Gait             General Gait Details: Unable to initiate gait training this session secondary to fatigue, pt requesting to sit back down.   Stairs             Wheelchair Mobility    Modified Rankin (Stroke Patients Only) Modified Rankin (Stroke Patients Only) Pre-Morbid Rankin Score: Moderately severe disability Modified Rankin: Moderately severe disability     Balance Overall balance assessment: Needs assistance Sitting-balance support: No upper extremity supported;Feet supported Sitting balance-Leahy Scale: Fair Sitting balance - Comments: min assist to scoot forward in recliner and come off back support   Standing balance support: Bilateral upper extremity supported Standing balance-Leahy Scale: Poor Standing balance comment: reliant on UE support with RW, L lateral lean                            Cognition Arousal/Alertness: Awake/alert Behavior During Therapy: Flat affect Overall Cognitive Status: Impaired/Different from baseline Area of Impairment: Orientation;Problem solving                 Orientation Level: Disoriented to;Situation;Time           Problem Solving: Slow processing;Requires verbal cues General Comments: slow processing and slowed responses verbally but appropriate throughout session      Exercises General Exercises - Lower Extremity Ankle Circles/Pumps: AROM;Both;20 reps;Seated Long Arc Quad: AROM;20 reps;Both;Seated Hip Flexion/Marching: AROM;Both;20 reps;Seated    General Comments  Pertinent Vitals/Pain      Home Living                      Prior Function            PT Goals (current goals can now be found in the care plan section) Progress towards PT goals: Progressing toward goals    Frequency    Min 3X/week      PT Plan Discharge plan needs to  be updated;Frequency needs to be updated    Co-evaluation              AM-PAC PT "6 Clicks" Mobility   Outcome Measure  Help needed turning from your back to your side while in a flat bed without using bedrails?: A Lot Help needed moving from lying on your back to sitting on the side of a flat bed without using bedrails?: A Lot Help needed moving to and from a bed to a chair (including a wheelchair)?: A Little Help needed standing up from a chair using your arms (e.g., wheelchair or bedside chair)?: A Little Help needed to walk in hospital room?: A Lot Help needed climbing 3-5 steps with a railing? : Total 6 Click Score: 13    End of Session Equipment Utilized During Treatment: Oxygen;Gait belt Activity Tolerance: Patient limited by fatigue Patient left: with call bell/phone within reach;with family/visitor present;in chair Nurse Communication: Mobility status PT Visit Diagnosis: Hemiplegia and hemiparesis;Other abnormalities of gait and mobility (R26.89);Muscle weakness (generalized) (M62.81) Hemiplegia - Right/Left: Left Hemiplegia - dominant/non-dominant: Non-dominant Hemiplegia - caused by: Cerebral infarction     Time: 0904-0920 PT Time Calculation (min) (ACUTE ONLY): 16 min  Charges:  $Therapeutic Exercise: 8-22 mins                     Netta Corrigan, PT, DPT, CSRS Acute Rehab Office [employer] 12/02/2018, 11:31 AM

## 2018-12-02 NOTE — Care Management Important Message (Signed)
Important Message  Patient Details  Name: Denise [name] MRN: [id number] Date of Birth: February 02, 1945   Medicare Important Message Given:  Yes     Jhamal Plucinski Montine Circle 12/02/2018, 4:01 PM

## 2018-12-02 NOTE — TOC Initial Note (Signed)
Transition of Care [facility]) - Initial/Assessment Note    Patient Details  Name: Denise [name] MRN: [id number] Date of Birth: 01-Jul-1945  Transition of Care [facility]) CM/SW Contact:    Sharin Mons, RN Phone Number: 12/02/2018, 4:12 PM  Clinical Narrative:          Pt with new right thalamic capsular infarct. Pt with recent (11/2)  R cerebellar infarcts. Pt declined for CIR.            NCM received consult for possible SNF placement at time of discharge. NCM spoke with patient and pt's spouse regarding PT recommendation of SNF placement at time of discharge. Patient reported that patient's spouse  is currently unable to care for patient at their home given patient's current physical needs and fall risk. Patient expressed understanding of PT recommendation and is agreeable to SNF placement at time of discharge. Patient reports no SNF preference. NCM discussed insurance authorization process and provided Medicare SNF ratings list. Patient expressed being hopeful for rehab and to feel better soon. No further questions reported at this time. NCM to continue to follow and assist with discharge planning needs.   Expected Discharge Plan: Skilled Nursing Facility Barriers to Discharge: Continued Medical Work up   Patient Goals and CMS Choice Patient states their goals for this hospitalization and ongoing recovery are:: to get better and go home CMS Medicare.gov Compare Post Acute Care list provided to:: Patient    Expected Discharge Plan and Services Expected Discharge Plan: [place]        Prior Living Arrangements/Services   Activities of Daily Living      Permission Sought/Granted                  Emotional Assessment              Admission diagnosis:  Cerebrovascular accident (CVA), unspecified mechanism [facility]) [I63.9] Patient Active Problem List   Diagnosis Date Noted  . Acute ischemic right MCA stroke ([place]) 11/27/2018  . Vaginal  candidiasis   . Acute blood loss anemia   . Supplemental oxygen dependent   . Uncontrolled type 2 diabetes mellitus with hyperglycemia ([place])   . ICH (intracerebral hemorrhage) ([place]) s/p tPA administration 11/08/2018  . Hypertensive urgency 11/08/2018  . Cerebellar stroke, acute ([place]) 11/08/2018  . History of ischemic stroke 11/04/2018  . COPD exacerbation ([place]) 10/28/2018  . Acute on chronic systolic CHF (congestive heart failure) ([place]) 10/28/2018  . Elevated troponin 10/28/2018  . Lactic acidosis 10/28/2018  . GERD (gastroesophageal reflux disease) 10/28/2018  . Acute on chronic respiratory failure with hypercapnia ([place]) 10/28/2018  . Diabetes mellitus without complication-diet controled 10/28/2018  . On home oxygen therapy 07/12/2018  . CHF (congestive heart failure) ([place]) 05/16/2018  . Chronic respiratory failure with hypoxia and hypercapnia ([place]) 05/03/2018  . Borderline abnormal TFTs 05/02/2018  . Type 2 diabetes mellitus ([place]) 04/26/2018  . Unintended weight loss 04/26/2018  . Lower extremity edema 02/20/2018  . Chronic respiratory failure with hypoxia ([place]) 10/02/2017  . Hematuria 09/27/2017  . Pulmonary hypertension ([name]) c/w cor pulmonale   . Protein-calorie malnutrition, severe 09/17/2017  . Constipation   . Hyperparathyroidism, primary ([place]) 07/27/2016  . Loss of appetite 06/02/2016  . COPD GOLD  II  spirometry, severe Emphysema and 02 dep 11/22/2015  . Vitamin D deficiency 11/22/2015  . Anxiety and depression 11/22/2015  . Insomnia 11/22/2015  . Hyperlipidemia 11/22/2015  . Essential hypertension 11/22/2015  . Hx of colonic polyps 02/10/2013  .  History of colon cancer 06/11/1986   PCP:  [name], NP Pharmacy:   [id number] [place], [place] [address]Phone: [phone]: [phone] [facility] - [address] [place] [address]Phone: [phone]: [phone]    Social Determinants of Health (SDOH) Interventions    Readmission Risk Interventions No flowsheet data found.

## 2018-12-03 ENCOUNTER — Inpatient Hospital Stay (HOSPITAL_COMMUNITY): Payer: Medicare HMO

## 2018-12-03 LAB — CBC WITH DIFFERENTIAL/PLATELET
Abs Immature Granulocytes: 0.05 10*3/uL (ref 0.00–0.07)
Basophils Absolute: 0.1 10*3/uL (ref 0.0–0.1)
Basophils Relative: 2 %
Eosinophils Absolute: 0.1 10*3/uL (ref 0.0–0.5)
Eosinophils Relative: 2 %
HCT: 35.5 % — ABNORMAL LOW (ref 36.0–46.0)
Hemoglobin: 11.2 g/dL — ABNORMAL LOW (ref 12.0–15.0)
Immature Granulocytes: 1 %
Lymphocytes Relative: 24 %
Lymphs Abs: 1 10*3/uL (ref 0.7–4.0)
MCH: 26.4 pg (ref 26.0–34.0)
MCHC: 31.5 g/dL (ref 30.0–36.0)
MCV: 83.7 fL (ref 80.0–100.0)
Monocytes Absolute: 0.4 10*3/uL (ref 0.1–1.0)
Monocytes Relative: 11 %
Neutro Abs: 2.5 10*3/uL (ref 1.7–7.7)
Neutrophils Relative %: 60 %
Platelets: 182 10*3/uL (ref 150–400)
RBC: 4.24 MIL/uL (ref 3.87–5.11)
RDW: 16.6 % — ABNORMAL HIGH (ref 11.5–15.5)
WBC: 4.1 10*3/uL (ref 4.0–10.5)
nRBC: 0 % (ref 0.0–0.2)

## 2018-12-03 LAB — RENAL FUNCTION PANEL
Albumin: 3.1 g/dL — ABNORMAL LOW (ref 3.5–5.0)
Anion gap: 10 (ref 5–15)
BUN: 31 mg/dL — ABNORMAL HIGH (ref 8–23)
CO2: 26 mmol/L (ref 22–32)
Calcium: 10.6 mg/dL — ABNORMAL HIGH (ref 8.9–10.3)
Chloride: 109 mmol/L (ref 98–111)
Creatinine, Ser: 0.76 mg/dL (ref 0.44–1.00)
GFR calc Af Amer: >60 mL/min (ref >60)
GFR calc non Af Amer: >60 mL/min (ref >60)
Glucose, Bld: 113 mg/dL — ABNORMAL HIGH (ref 70–99)
Phosphorus: 2.4 mg/dL — ABNORMAL LOW (ref 2.5–4.6)
Potassium: 3.9 mmol/L (ref 3.5–5.1)
Sodium: 145 mmol/L (ref 135–145)

## 2018-12-03 LAB — GLUCOSE, CAPILLARY
Glucose-Capillary: 109 mg/dL — ABNORMAL HIGH (ref 70–99)
Glucose-Capillary: 173 mg/dL — ABNORMAL HIGH (ref 70–99)
Glucose-Capillary: 129 mg/dL — ABNORMAL HIGH (ref 70–99)
Glucose-Capillary: 125 mg/dL — ABNORMAL HIGH (ref 70–99)
Glucose-Capillary: 210 mg/dL — ABNORMAL HIGH (ref 70–99)
Glucose-Capillary: 157 mg/dL — ABNORMAL HIGH (ref 70–99)

## 2018-12-03 LAB — MAGNESIUM: Magnesium: 1.8 mg/dL (ref 1.7–2.4)

## 2018-12-03 LAB — SARS CORONAVIRUS 2 (TAT 6-24 HRS): SARS Coronavirus 2: NEGATIVE

## 2018-12-03 LAB — PTH, INTACT AND CALCIUM
Calcium, Total (PTH): 11 mg/dL — ABNORMAL HIGH (ref 8.7–10.3)
PTH: 42 pg/mL (ref 15–65)

## 2018-12-03 MED ORDER — SODIUM CHLORIDE 0.45 % IV SOLN
Freq: Once | INTRAVENOUS | Status: AC
  Administered 2018-12-03: 09:00:00 via INTRAVENOUS

## 2018-12-03 MED ORDER — APIXABAN 5 MG PO TABS
5.0000 mg | ORAL_TABLET | ORAL | Status: AC
  Administered 2018-12-03: 15:00:00 5 mg via ORAL
  Filled 2018-12-03: qty 1

## 2018-12-03 MED ORDER — APIXABAN 5 MG PO TABS: 5.0000 mg | ORAL_TABLET | Freq: Two times a day (BID) | ORAL | Status: DC

## 2018-12-03 MED ORDER — AMLODIPINE BESYLATE 5 MG PO TABS
5.0000 mg | ORAL_TABLET | Freq: Every day | ORAL | Status: DC
  Administered 2018-12-03 – 2018-12-06 (×4): 5 mg via ORAL
  Filled 2018-12-03 (×4): qty 1

## 2018-12-03 MED ORDER — FUROSEMIDE 10 MG/ML IJ SOLN
40.0000 mg | Freq: Once | INTRAMUSCULAR | Status: AC
  Administered 2018-12-03: 22:00:00 40 mg via INTRAVENOUS
  Filled 2018-12-03: qty 4

## 2018-12-03 MED ORDER — APIXABAN 5 MG PO TABS
5.0000 mg | ORAL_TABLET | Freq: Two times a day (BID) | ORAL | Status: DC
  Administered 2018-12-03 – 2018-12-06 (×6): 5 mg via ORAL
  Filled 2018-12-03 (×6): qty 1

## 2018-12-03 NOTE — Progress Notes (Signed)
Transitions of Care Pharmacist Note  Denise [name] is a 73 y.o. female that has been diagnosed with A Fib and will be prescribed Eliquis (apixaban) at discharge.   Patient Education: I provided the following education on Eliquis (apixaban) to the patient: Described what the medication is Signs of bleeding Signs/symptoms of VTE and stroke  Answered their questions  Discharge Medications Plan: The patient is not interested in filling their discharge medications with the Transitions of Care pharmacy at this time.   If the patient later decides they would like to have the Transitions of Care pharmacy fill their discharge medications, please call us at [phone]. Thank you.    Thank you,   Acey Lav, PharmD  PGY1 Acute Care Pharmacy Resident [phone]  December 03, 2018

## 2018-12-03 NOTE — Consult Note (Signed)
   [facility] CM Inpatient Consult   12/03/2018  Denise [name] December 02, 1945 [id number]   Patient screened for high risk score for unplanned readmission for less than 30 days with 4 admissions in the past 6 months.   Review of patient's medical record reveals patient is currently being considered for a skilled nursing facility stay. According to therapy notes patient would likely not be able to tolerate the required 3 hours of therapy for an inpatient rehab admission.  Plan:  Follow for disposition, will follow up if returning to community. If SNF admission, will sign off.   For questions, please contact:   Natividad Brood, RN BSN [facility] Liaison  [phone] business mobile phone Toll free office [phone]  Fax number: [phone] [email or web address]

## 2018-12-03 NOTE — Progress Notes (Signed)
[name]  PROGRESS NOTE    Denise [name]  EBR:[id number] DOB: [birth year] DOA: 11/27/2018 PCP: [name], NP   Brief Narrative:   Denise Philipps Richardsonis a 73 y.o.femalewith medical history significant oftype 2 diabetes not on treatment, hypertension, COPD with chronic hypoxic respiratory failure on 4 L oxygen, pulmonary hypertension who was recently admitted to the hospital with acute ischemic stroke, status post TPA with hemorrhagic conversion treated with cryoprecipitate and tranexamic acid, went to acute inpatient rehab and discharged home on 11/19/2018 comes back to the emergency room with her husband with more than usual weakness on the left side. According to the husband at bedside, when she left the rehab, she was able to eat by herself, she was able to use her left hand. She still had some weakness, however she was able to function and walk with support. This morning, her husband found her hard to arouse, found with increasing speech difficulty and worsening left-sided weakness. Patient also complaining of some shoulder pain and aching. She has some dysarthria. She uses 4 L oxygen at baseline and has no worsening shortness of breath or wheezing. MRI showed new right thalamocapsular stroke with evaluating old hemorrhagic strokes. CTA with no acute occlusion, severe stenosis left vertebral artery origin. Neurology consulted.  11/30: More alert today, but says she is tired. Talked about her working with PT today.  12/1: Even more interactive today. She denies complaints. Spoke with her and husband about eliquis.    Assessment & Plan:   Principal Problem:   Acute ischemic right MCA stroke ([name]) Active Problems:   COPD GOLD  II  spirometry, severe Emphysema and 02 dep   Anxiety and depression   Hyperlipidemia   Essential hypertension   Chronic respiratory failure with hypoxia ([name])   GERD (gastroesophageal reflux disease)   ICH (intracerebral hemorrhage) ([name]) s/p tPA  administration   Uncontrolled type 2 diabetes mellitus with hyperglycemia ([name])  Acute ischemic right thalamic stroke, multiple territory stroke with recent ischemic stroke - MRI brain acute infarct centered within the right thalamocapsular junction  - CTA head/neck no large vessel occlusion. No significant stenosis at the ICA origins - Neurology consulted: continue aspirin, plavix for 3 weeks then aspirin alone; continue statin at discharge - PT/OT recommending CIR placement; has been reviewed by inpt rehab, was not appropriate at the time, continuing to w/u PT/OT     - 11/30: PT rec SNF; will work with CM on options.     - 12/1: Doing ok this AM. Repeat CTH was stable. Presumed a fib as cause of her previous stroke. Spoke with pt/husband about eliquis. Will begin today and d/c ASA/Plavix   COPD with chronic hypoxic failure on 4 L oxygen at baseline - not in exacerbation; continue inahlers/O2 support     - 11/30: denies complaints today     - O2 use is up a little today, but she says she is comfortable. Monitor. Follow up with CXR if she continues to increase usage.  Essential hypertension - getting metoprolol IV now; transition to PO metoprolol in AM; start with 12.5 mg BID - per neurology: d/t high grade left VA stenosis, want to avoid hypotension  Type 2 diabetes - recent A1c 7 - continue sliding scale insulin  Prolonged QTc - repeat EKG with improvement in QTC - monitor lytes  HLD - resume lipitor at discharge  Hypercalcemia Hypernatremia - fluids; add NS, monitor - check PTH     - 11/30: Ca2+ improved today after fluids;  will continue fluids today; Na+ is up today, will add 1/2 NS     - 12/1: PTH is normal; Ca2+ is stable, will add a little more in fluids today and encourage diet; Na+ is improved  Hypokalemia Hypophosphatemia     - K+ is resolved, monitor     - add phos; monitor  A fib     - currently on  metoprolol 2.5 mg IV q6h; change to 12.68m PO BID.     - qualifies for anticoagulation; [facility]    - 12/1: repeat CTH is stable, starting eliquis; d/c ASA/plavix.    DVT prophylaxis: eliquis Code Status: FULL Family Communication: with husband at bedside   Disposition Plan: To SNF  Consultants:   Neurology  Subjective: "I hear you!"  Objective: Vitals:   12/03/18 0405 12/03/18 0803 12/03/18 0813 12/03/18 1148  BP: (!) 152/97  (!) 163/114 (!) 159/111  Pulse: 71 61 69 63  Resp: (!) _0 (!) 22  Temp: (!) 97.4 F (36.3 C)  (!) 97.5 F (36.4 C) 98 F (36.7 C)  TempSrc:   Axillary Oral  SpO2: 91% 96% 93% 99%  Weight:      Height:        Intake/Output Summary (Last 24 hours) at 12/03/2018 1435 Last data filed at 12/03/2018 1000 Gross per 24 hour  Intake 200 ml  Output 150 ml  Net 50 ml   Filed Weights   11/27/18 1241 11/28/18 0127 12/01/18 0407  Weight: 42.6 kg 43.5 kg 43.8 kg    Examination:  General: 73y.o. female resting in bed in NAD Cardiovascular: RRR, +S1, S2, no m/g/r, equal pulses throughout Respiratory: clear, normal WOB GI: BS+, NDNT, no masses noted, no organomegaly noted MSK: No e/c/c Neuro: alert to name, follows commands Psyc: Appropriate interaction and affect, calm/cooperative   Data Reviewed: I have personally reviewed following labs and imaging studies.  CBC: Recent Labs  Lab 11/27/18 1333 12/01/18 0758 12/02/18 0256 12/03/18 0116  WBC 4.0 5.0 3.7* 4.1  NEUTROABS 2.5  --  2.3 2.5  HGB 13.5 12.5 11.4* 11.2*  HCT 45.5 40.9 36.7 35.5*  MCV 86.8 85.0 84.8 83.7  PLT 226 178 192 1196  Basic Metabolic Panel: Recent Labs  Lab 11/27/18 1333 11/28/18 1237 11/29/18 0747 12/01/18 0758 12/02/18 0256 12/03/18 0116  NA 145  --  146* 147* 149* 145  K 4.6 4.5 4.2 3.8 3.4* 3.9  CL 104  --  106 108 109 109  CO2 33*  --  _1 GLUCOSE 116*  --  148* 153* 71 113*  BUN 21  --  25* 29* 33* 31*   CREATININE 1.17*  --  1.16* 0.97 0.87 0.76  CALCIUM 11.7*  --  11.7* 11.1* 10.5*  11.0* 10.6*  MG  --  1.9  --   --  1.9 1.8  PHOS  --   --   --   --  3.1 2.4*   GFR: Estimated Creatinine Clearance: 43.3 mL/min (by C-G formula based on SCr of 0.76 mg/dL). Liver Function Tests: Recent Labs  Lab 11/27/18 1333 12/01/18 0758 12/02/18 0256 12/03/18 0116  AST 34 29  --   --   ALT 18 16  --   --   ALKPHOS 108 84  --   --   BILITOT 1.0 0.9  --   --   PROT 7.8 6.1*  --   --  ALBUMIN 4.5 3.3* 3.2* 3.1*   No results for input(s): LIPASE, AMYLASE in the last 168 hours. No results for input(s): AMMONIA in the last 168 hours. Coagulation Profile: No results for input(s): INR, PROTIME in the last 168 hours. Cardiac Enzymes: No results for input(s): CKTOTAL, CKMB, CKMBINDEX, TROPONINI in the last 168 hours. BNP (last 3 results) Recent Labs    03/01/18 1218 05/02/18 1249 06/24/18 1111  PROBNP 1,336.0* 910.0* 840.0*   HbA1C: No results for input(s): HGBA1C in the last 72 hours. CBG: Recent Labs  Lab 12/02/18 1221 12/02/18 1655 12/02/18 2131 12/03/18 0809 12/03/18 1146  GLUCAP 135* 160* 112* 109* 173*   Lipid Profile: No results for input(s): CHOL, HDL, LDLCALC, TRIG, CHOLHDL, LDLDIRECT in the last 72 hours. Thyroid Function Tests: No results for input(s): TSH, T4TOTAL, FREET4, T3FREE, THYROIDAB in the last 72 hours. Anemia Panel: No results for input(s): VITAMINB12, FOLATE, FERRITIN, TIBC, IRON, RETICCTPCT in the last 72 hours. Sepsis Labs: No results for input(s): PROCALCITON, LATICACIDVEN in the last 168 hours.  Recent Results (from the past 240 hour(s))  SARS CORONAVIRUS 2 (TAT 6-24 HRS) Nasopharyngeal Nasopharyngeal Swab     Status: None   Collection Time: 11/27/18  2:47 PM   Specimen: Nasopharyngeal Swab  Result Value Ref Range Status   SARS Coronavirus 2 NEGATIVE NEGATIVE Final    Comment: (NOTE) SARS-CoV-2 target nucleic acids are NOT DETECTED. The SARS-CoV-2  RNA is generally detectable in upper and lower respiratory specimens during the acute phase of infection. Negative results do not preclude SARS-CoV-2 infection, do not rule out co-infections with other pathogens, and should not be used as the sole basis for treatment or other patient management decisions. Negative results must be combined with clinical observations, patient history, and epidemiological information. The expected result is Negative. Fact Sheet for Patients: SugarRoll.be Fact Sheet for Healthcare Providers: https://www.woods-mathews.com/ This test is not yet approved or cleared by the [place] FDA and  has been authorized for detection and/or diagnosis of SARS-CoV-2 by FDA under an Emergency Use Authorization (EUA). This EUA will remain  in effect (meaning this test can be used) for the duration of the COVID-19 declaration under Section 56 4(b)(1) of the Act, 21 U.S.C. section 360bbb-3(b)(1), unless the authorization is terminated or revoked sooner. Performed at [facility], [place] [address] [place], [place] [address]   Culture, Urine     Status: Abnormal (Preliminary result)   Collection Time: 12/01/18  4:36 PM   Specimen: Urine, Random  Result Value Ref Range Status   Specimen Description URINE, RANDOM  Final   Special Requests NONE  Final   Culture (A)  Final    >=100,000 COLONIES/mL GRAM NEGATIVE RODS CULTURE REINCUBATED FOR BETTER GROWTH Performed at [facility], [place] [address] [place], [place] [address]    Report Status PENDING  Incomplete      Radiology Studies: Ct Head Wo Contrast  Result Date: 12/02/2018 CLINICAL DATA:  73 year old female with acute right thalamic infarct, subacute right cerebellar hemorrhagic infarct. EXAM: CT HEAD WITHOUT CONTRAST TECHNIQUE: Contiguous axial images were obtained from the base of the skull through the vertex without intravenous contrast. COMPARISON:  Brain MRI  11/27/2018 and earlier. FINDINGS: Brain: Expected evolution of hypodensity in the right thalamus since the CT on 11/27/2018 (series 4, image 14). No associated hemorrhage or mass effect. Stable right cerebellum PICA territory infarct with petechial hemorrhage. No significant posterior fossa mass effect. Stable gray-white matter differentiation throughout the brain elsewhere with superimposed  smaller chronic left cerebellar and deep gray nuclei infarcts. Small area of cortical encephalomalacia in the posterior right temporal lobe. No midline shift, ventriculomegaly, mass effect, evidence of mass lesion, or evidence of new cortically based infarction. Vascular: Calcified atherosclerosis at the skull base. No suspicious intracranial vascular hyperdensity. Skull: No acute osseous abnormality identified. Sinuses/Orbits: Visualized paranasal sinuses and mastoids are stable and well pneumatized. Other: No acute orbit or scalp soft tissue findings. IMPRESSION: 1. Expected evolution of the right thalamic infarct since 11/27/2018. No associated hemorrhage or mass effect. 2. Stable right cerebellum PICA infarct with petechial hemorrhage, no malignant hemorrhagic transformation or mass effect. 3. No new intracranial abnormality. Electronically Signed   By: Genevie Ann M.D.   On: 12/02/2018 20:24     Scheduled Meds: . amLODipine  5 mg Oral Daily  . apixaban  5 mg Oral STAT  . [START ON 12/04/2018] apixaban  5 mg Oral BID  . fluticasone furoate-vilanterol  1 puff Inhalation Daily  . insulin aspart  0-5 Units Subcutaneous QHS  . insulin aspart  0-9 Units Subcutaneous TID WC  . mouth rinse  15 mL Mouth Rinse BID  . metoprolol tartrate  12.5 mg Oral BID  . potassium chloride  40 mEq Oral Daily  . Resource ThickenUp Clear   Oral Once  . umeclidinium bromide  1 puff Inhalation Daily   Continuous Infusions:   LOS: 6 days    Time spent: 25 minutes spent in the coordination of care today.   Jonnie Finner, DO [facility] Pager [phone]  If 7PM-7AM, please contact night-coverage [email or web address] Password [email or web address] 12/03/2018, 2:35 PM

## 2018-12-03 NOTE — Plan of Care (Signed)
  Problem: Education: Goal: Knowledge of General Education information will improve Description: Including pain rating scale, medication(s)/side effects and non-pharmacologic comfort measures Outcome: Progressing   Problem: Health Behavior/Discharge Planning: Goal: Ability to manage health-related needs will improve Outcome: Progressing   Problem: Clinical Measurements: Goal: Ability to maintain clinical measurements within normal limits will improve Outcome: Progressing Goal: Will remain free from infection Outcome: Progressing Goal: Diagnostic test results will improve Outcome: Progressing Goal: Respiratory complications will improve Outcome: Progressing Goal: Cardiovascular complication will be avoided Outcome: Progressing   Problem: Activity: Goal: Risk for activity intolerance will decrease Outcome: Progressing   Problem: Nutrition: Goal: Adequate nutrition will be maintained Outcome: Progressing   Problem: Coping: Goal: Level of anxiety will decrease Outcome: Progressing   Problem: Elimination: Goal: Will not experience complications related to bowel motility Outcome: Progressing Goal: Will not experience complications related to urinary retention Outcome: Progressing   Problem: Pain Managment: Goal: General experience of comfort will improve Outcome: Progressing   Problem: Safety: Goal: Ability to remain free from injury will improve Outcome: Progressing   Problem: Skin Integrity: Goal: Risk for impaired skin integrity will decrease Outcome: Progressing   Problem: Education: Goal: Knowledge of disease or condition will improve Outcome: Progressing Goal: Knowledge of secondary prevention will improve Outcome: Progressing   Problem: Education: Goal: Knowledge of disease or condition will improve Outcome: Progressing Goal: Knowledge of the prescribed therapeutic regimen will improve Outcome: Progressing Goal: Individualized Educational  Video(s) Outcome: Progressing   Problem: Activity: Goal: Ability to tolerate increased activity will improve Outcome: Progressing Goal: Will verbalize the importance of balancing activity with adequate rest periods Outcome: Progressing   Problem: Respiratory: Goal: Ability to maintain a clear airway will improve Outcome: Progressing Goal: Levels of oxygenation will improve Outcome: Progressing Goal: Ability to maintain adequate ventilation will improve Outcome: Progressing

## 2018-12-03 NOTE — Discharge Instructions (Signed)

## 2018-12-03 NOTE — Progress Notes (Signed)
ANTICOAGULATION CONSULT NOTE - Initial Consult  Pharmacy Consult for Eliquis Indication: atrial fibrillation  No Known Allergies  Patient Measurements: Height: _0  (165.1 cm) Weight: 96 lb 9 oz (43.8 kg) IBW/kg (Calculated) : 57  Vital Signs: Temp: 98 F (36.7 C) (12/01 1148) Temp Source: Oral (12/01 1148) BP: 159/111 (12/01 1148) Pulse Rate: 63 (12/01 1148)  Labs: Recent Labs    12/01/18 0758 12/02/18 0256 12/03/18 0116  HGB 12.5 11.4* 11.2*  HCT 40.9 36.7 35.5*  PLT 178 192 182  CREATININE 0.97 0.87 0.76    Estimated Creatinine Clearance: 43.3 mL/min (by C-G formula based on SCr of 0.76 mg/dL).   Medical History: Past Medical History:  Diagnosis Date  . Altered mental status   . Anxiety and depression   . Chronic respiratory failure with hypoxia ([place]) 10/02/2017   Assoc with ? Cor pulmonale dx 09/2017 so rx 02 1-2 lpm 24/7  - 10/17/2017  Walked RA x 3 laps @ 185 ft each stopped due to  End of study, nl pace,  desat to 87 on 3rd lap - 12/19/2017  Saturations on Room Air at Rest =91 % and while Ambulating = 87%  But  on  2 Liters of pulsed oxygen while Ambulating =93% so rec POC 2lpm walking / use at rest if sats under 90%      . Colon cancer ([place]) 1988   Resected  . COPD (chronic obstructive pulmonary disease) ([place])   . Diabetes mellitus without complication ([name])    diet controlled- no meds. per pt  . Diarrhea 04/02/2018  . Diverticulosis   . Hyperlipidemia   . Hypertension   . Hypoxia 10/30/2018  . Insomnia   . Primary hyperparathyroidism ([place])   . SBO (small bowel obstruction) ([place])   . Stroke ([place]) 10/2018   tpa administered  . Tubular adenoma of rectum 02/23/2013   low grade  . Vitamin D deficiency    Assessment:  CC/HPI: hard to arouse, found with increasing speech difficulty and worsening left-sided weakness.  PMH: DM, HTN, HLD, colon cancer, COPD on 4L, chronic resp failure, CHF  Significant events:  11/2: CVA with hemorrhagic  conversion >>Went to rehab and was discharged 11/17.   Anticoag: New PAF. Start DOAC now (4 wks out from hemorrhagic conversion). ASA and plavix can be discontinued. Hgb 11.2. Plts WNL. Scr WNL.  Goal of Therapy:  Therapeutic oral anticoagulation   Plan:  Eliquis 18m BID Ok per Neuro note to stop ASA and Plavix  Sharlette Jansma S. RAlford Highland PharmD, BCanyon LakeClinical Staff Pharmacist REilene GhaziStillinger 12/03/2018,1:15 PM

## 2018-12-03 NOTE — Progress Notes (Signed)
Physical Therapy Treatment Patient Details Name: Denise [name] MRN: [id number] DOB: 27-Jan-1945 Today's Date: 12/03/2018    History of Present Illness Pt is a 73 y/o female admitted with increasing L sided weakness and lethargy. Pt found to have new right thalamic capsular infarct. Pt with recent (11/2)  R cerebellar infarct s/p tPA w/ hemorrhagic transformation, pt also has small infarcts L sided cerebrum. PMH - DM, HTN, colon cancer, COPD, chronic respiratory failure with hypoxia, systolic CHF.    PT Comments    Pt progressing with functional mobility this session and able to tolerate increased OOB activity compared to yesterdays session. Pt continues to fatigue very quickly with mobility, SpO2 ranging from 85-97% during transfers while on 6L O2/min this session. Pt performed bed mobility with min-mod assist and performed stand pivot transfers from bed<>commode with min assist HHA, cues for techniques and safety. Will continue to follow acutely to address impairments in strength, balance and cardiovascular endurance. Recommend SNF follow up level therapy to maximize independence with mobility.    Follow Up Recommendations  SNF     Equipment Recommendations  None recommended by PT    Recommendations for Other Services       Precautions / Restrictions Precautions Precautions: Fall Precaution Comments: monitor SpO2 sats (on 4L of O2 via nasal cannula baseline) Restrictions Weight Bearing Restrictions: No    Mobility  Bed Mobility Overal bed mobility: Needs Assistance Bed Mobility: Supine to Sit Rolling: Min assist   Supine to sit: Mod assist     General bed mobility comments: min assist for supine>sit to help with trunk elevation, mod assist for sit>supine for LE management  Transfers Overall transfer level: Needs assistance Equipment used: None Transfers: Sit to/from Stand Sit to Stand: Min assist Stand pivot transfers: Min assist       General transfer  comment: stand pivots this session from EOB<>commode with HHA from therapist, cues for safety.  Ambulation/Gait                 Stairs             Wheelchair Mobility    Modified Rankin (Stroke Patients Only)       Balance Overall balance assessment: Needs assistance Sitting-balance support: No upper extremity supported;Feet supported Sitting balance-Leahy Scale: Fair Sitting balance - Comments: supervision for sitting balance   Standing balance support: During functional activity;Single extremity supported Standing balance-Leahy Scale: Poor Standing balance comment: HHA from therapist during stand pivot transfer, min assist for standing balance                            Cognition Arousal/Alertness: Awake/alert Behavior During Therapy: Flat affect Overall Cognitive Status: Impaired/Different from baseline Area of Impairment: Orientation;Problem solving                 Orientation Level: Disoriented to;Situation;Time Current Attention Level: Sustained           General Comments: slow processing and slowed responses verbally but appropriate throughout session      Exercises      General Comments General comments (skin integrity, edema, etc.): Pt on 6L O2/min throughout this session, with SpO2 between 86-97% with activity transferring OOB to bedside commode, cues for breathing techniques      Pertinent Vitals/Pain Pain Assessment: No/denies pain Faces Pain Scale: No hurt    Home Living  Prior Function            PT Goals (current goals can now be found in the care plan section) Acute Rehab PT Goals Patient Stated Goal: to go home Progress towards PT goals: Progressing toward goals    Frequency    Min 3X/week      PT Plan      Co-evaluation              AM-PAC PT "6 Clicks" Mobility   Outcome Measure  Help needed turning from your back to your side while in a flat bed without  using bedrails?: A Little Help needed moving from lying on your back to sitting on the side of a flat bed without using bedrails?: A Lot Help needed moving to and from a bed to a chair (including a wheelchair)?: A Little Help needed standing up from a chair using your arms (e.g., wheelchair or bedside chair)?: A Little Help needed to walk in hospital room?: A Lot Help needed climbing 3-5 steps with a railing? : Total 6 Click Score: 14    End of Session Equipment Utilized During Treatment: Oxygen;Gait belt Activity Tolerance: Patient limited by fatigue Patient left: in bed;with family/visitor present;with bed alarm set Nurse Communication: Mobility status PT Visit Diagnosis: Hemiplegia and hemiparesis;Other abnormalities of gait and mobility (R26.89);Muscle weakness (generalized) (M62.81) Hemiplegia - Right/Left: Left Hemiplegia - dominant/non-dominant: Non-dominant Hemiplegia - caused by: Cerebral infarction     Time: 1102-1117 PT Time Calculation (min) (ACUTE ONLY): 18 min  Charges:  $Therapeutic Activity: 8-22 mins                     Netta Corrigan, PT, DPT, [place] Acute Rehab Office [place] 12/03/2018, 4:32 PM

## 2018-12-03 NOTE — Progress Notes (Signed)
  Speech Language Pathology Treatment: Dysphagia  Patient Details Name: Denise [name] MRN: [id number] DOB: 10/14/1945 Today's Date: 12/03/2018 Time: 1230-1310 SLP Time Calculation (min) (ACUTE ONLY): 40 min  Assessment / Plan / Recommendation Clinical Impression  Session focused on dysphagia goals. Upon arrival, patient had a water pitcher along with a cup full of ice water with a straw sitting on her bedside table. Patient's husband reported the patient had not consumed any of the liquid yet and assumed it was thickened. Patient provided education to the patient and her husband in regards to current swallowing function, diet recommendations and appropriate consistencies. Both verbalized understanding. Patient consumed nectar-thick liquids via cup without overt s/s of aspiration with the exception of a subtle cough after consuming multiple, sequential swallows. Coughing eliminated with small, single sips. Patient also consumed trials of Dys. 2 textures with prolonged mastication and reports of it "not going down" with a liquid wash needed.  Patient with mildly labored breathing at baseline which appeared exacerbated after mastication. Due to patient's decreased endurance and prolonged mastication with solid textures, recommend patient continue current diet of Dys. 1 textures with nectar-thick liquids to maximize safety and overall energy conservation. Both the patient and her husband verbalized understanding and agreement.    HPI HPI: 73 y.o. female with past medical history significant for type 2 diabetes mellitus, hypertension, COPD, recent stroke  post TPA (11/2) with right cerebellar hemorrhagic transformation with new right thalamic capsular infarction.  CT head shows no significant changes, redemonstrates left vertebral artery severe stenosis.      SLP Plan  Continue with current plan of care       Recommendations  Diet recommendations: Dysphagia 1 (puree);Nectar-thick  liquid Liquids provided via: Cup Medication Administration: Crushed with puree Supervision: Patient able to self feed;Full supervision/cueing for compensatory strategies Compensations: Slow rate;Small sips/bites;Follow solids with liquid Postural Changes and/or Swallow Maneuvers: Seated upright 90 degrees                Oral Care Recommendations: Oral care BID Follow up Recommendations: Skilled Nursing facility SLP Visit Diagnosis: Dysphagia, oropharyngeal phase (R13.12) Plan: Continue with current plan of care       [place], [place] 12/03/2018, 1:57 PM

## 2018-12-03 NOTE — TOC Progression Note (Addendum)
Transition of Care [facility]) - Progression Note    Patient Details  Name: Denise [name] MRN: [id number] Date of Birth: 12-24-1945  Transition of Care [facility]) CM/SW Contact  Sharin Mons, RN Phone [phone] 12/03/2018, 3:31 PM  Clinical Narrative:    [facility] extended  SNF bed offer. Pt and husband accepted. NCM made admission liaison aware. Insurance authorization pending. Updated COVID needed. MD aware. Whitman Hero RN,BSN,CM   Expected Discharge Plan: Skilled Nursing Facility Barriers to Discharge: Insurance Authorization  Expected Discharge Plan and Services Expected Discharge Plan: [place]                                               Social Determinants of Health (SDOH) Interventions    Readmission Risk Interventions No flowsheet data found.

## 2018-12-03 NOTE — Progress Notes (Signed)
Occupational Therapy Treatment Patient Details Name: CAMYRA VAETH MRN: [id number] DOB: 11-14-1945 Today's Date: 12/03/2018    History of present illness Pt is a 73 y/o female admitted with increasing L sided weakness and lethargy. Pt found to have new right thalamic capsular infarct. Pt with recent (11/2)  R cerebellar infarct s/p tPA w/ hemorrhagic transformation, pt also has small infarcts L sided cerebrum. PMH - DM, HTN, colon cancer, COPD, chronic respiratory failure with hypoxia, systolic CHF.   OT comments  Pt participation limited by fatigue and decreased O2 sats with only bed level activity.  Engaged in grooming tasks at bed level with pt requiring cues for thoroughness. O2 sats ranged 90-93% on 5L supplemental O2 during bed level activity. [name] present and providing encouragement and assistance with orientation.  Due to decreased activity tolerance secondary to fatigue and O2 sats, recommend change in discharge plan to SNF as pt will be unable to tolerate multiple hours of therapy treatment per day.  Recommend SNF level therapies when medically stable to address ADLs and functional mobility.  Will continue to follow acutely.  Follow Up Recommendations  SNF;Supervision/Assistance - 24 hour    Equipment Recommendations  Other (comment)(defer to next venue)       Precautions / Restrictions Precautions Precautions: Fall Precaution Comments: monitor SpO2 sats (on 4L of O2 via nasal cannula baseline)       Mobility Bed Mobility     Rolling: Mod assist;Min assist         General bed mobility comments: Pt declined any OOB activity, engaged in rolling for improved positioning in bed         ADL either performed or assessed with clinical judgement   ADL Overall ADL's : Needs assistance/impaired     Grooming: Bed level;Wash/dry hands;Wash/dry face;Minimal assistance Grooming Details (indicate cue type and reason): assist and cues to complete task Upper Body  Bathing: Moderate assistance;Bed level   Lower Body Bathing: Total assistance;Bed level Lower Body Bathing Details (indicate cue type and reason): rolling, pt declined any standing this session                     Functional mobility during ADLs: Moderate assistance;Minimal assistance General ADL Comments: Pt limited by diminished O2, requiring 5L and sats ranged 90-93% with bed level activity               Cognition Arousal/Alertness: Awake/alert Behavior During Therapy: Flat affect Overall Cognitive Status: Impaired/Different from baseline Area of Impairment: Orientation;Problem solving                 Orientation Level: Disoriented to;Situation;Time             General Comments: slow processing and slowed responses verbally but appropriate throughout session                   Pertinent Vitals/ Pain       Pain Assessment: No/denies pain Faces Pain Scale: No hurt         Frequency  Min 2X/week        Progress Toward Goals  OT Goals(current goals can now be found in the care plan section)  Progress towards OT goals: Progressing toward goals  Acute Rehab OT Goals Patient Stated Goal: to go home OT Goal Formulation: With patient Time For Goal Achievement: 12/14/18 Potential to Achieve Goals: Good  Plan Discharge plan needs to be updated       AM-PAC OT "6 Clicks" Daily  Activity     Outcome Measure   Help from another person eating meals?: A Little Help from another person taking care of personal grooming?: A Lot Help from another person toileting, which includes using toliet, bedpan, or urinal?: A Lot Help from another person bathing (including washing, rinsing, drying)?: A Lot Help from another person to put on and taking off regular upper body clothing?: A Lot Help from another person to put on and taking off regular lower body clothing?: Total 6 Click Score: 12    End of Session Equipment Utilized During Treatment:  Oxygen(5L)  OT Visit Diagnosis: Other abnormalities of gait and mobility (R26.89);Unsteadiness on feet (R26.81);Muscle weakness (generalized) (M62.81);Other symptoms and signs involving cognitive function;Low vision, both eyes (H54.2);Hemiplegia and hemiparesis Hemiplegia - Right/Left: Left Hemiplegia - dominant/non-dominant: Non-Dominant Hemiplegia - caused by: Cerebral infarction   Activity Tolerance Patient tolerated treatment well   Patient Left in bed;with call bell/phone within reach;with bed alarm set   Nurse Communication Mobility status        Time: 1352-1406 OT Time Calculation (min): 14 min  Charges: OT General Charges $OT Visit: 1 Visit OT Treatments $Self Care/Home Management : 8-22 mins    Simonne Come [phone] 12/03/2018, 2:21 PM

## 2018-12-04 LAB — MAGNESIUM: Magnesium: 2.2 mg/dL (ref 1.7–2.4)

## 2018-12-04 LAB — CBC WITH DIFFERENTIAL/PLATELET
Abs Immature Granulocytes: 0.01 10*3/uL (ref 0.00–0.07)
Basophils Absolute: 0.1 10*3/uL (ref 0.0–0.1)
Basophils Relative: 1 %
Eosinophils Absolute: 0.1 10*3/uL (ref 0.0–0.5)
Eosinophils Relative: 2 %
HCT: 38.8 % (ref 36.0–46.0)
Hemoglobin: 12.1 g/dL (ref 12.0–15.0)
Immature Granulocytes: 0 %
Lymphocytes Relative: 21 %
Lymphs Abs: 0.9 10*3/uL (ref 0.7–4.0)
MCH: 26.4 pg (ref 26.0–34.0)
MCHC: 31.2 g/dL (ref 30.0–36.0)
MCV: 84.5 fL (ref 80.0–100.0)
Monocytes Absolute: 0.4 10*3/uL (ref 0.1–1.0)
Monocytes Relative: 9 %
Neutro Abs: 2.8 10*3/uL (ref 1.7–7.7)
Neutrophils Relative %: 67 %
Platelets: 228 10*3/uL (ref 150–400)
RBC: 4.59 MIL/uL (ref 3.87–5.11)
RDW: 16.6 % — ABNORMAL HIGH (ref 11.5–15.5)
WBC: 4.2 10*3/uL (ref 4.0–10.5)
nRBC: 0 % (ref 0.0–0.2)

## 2018-12-04 LAB — RENAL FUNCTION PANEL
Albumin: 3.3 g/dL — ABNORMAL LOW (ref 3.5–5.0)
Anion gap: 10 (ref 5–15)
BUN: 21 mg/dL (ref 8–23)
CO2: 27 mmol/L (ref 22–32)
Calcium: 10.9 mg/dL — ABNORMAL HIGH (ref 8.9–10.3)
Chloride: 106 mmol/L (ref 98–111)
Creatinine, Ser: 0.86 mg/dL (ref 0.44–1.00)
GFR calc Af Amer: >60 mL/min (ref >60)
GFR calc non Af Amer: >60 mL/min (ref >60)
Glucose, Bld: 117 mg/dL — ABNORMAL HIGH (ref 70–99)
Phosphorus: 3.1 mg/dL (ref 2.5–4.6)
Potassium: 3.9 mmol/L (ref 3.5–5.1)
Sodium: 143 mmol/L (ref 135–145)

## 2018-12-04 LAB — GLUCOSE, CAPILLARY
Glucose-Capillary: 151 mg/dL — ABNORMAL HIGH (ref 70–99)
Glucose-Capillary: 129 mg/dL — ABNORMAL HIGH (ref 70–99)
Glucose-Capillary: 177 mg/dL — ABNORMAL HIGH (ref 70–99)
Glucose-Capillary: 212 mg/dL — ABNORMAL HIGH (ref 70–99)

## 2018-12-04 MED ORDER — FUROSEMIDE 10 MG/ML IJ SOLN
40.0000 mg | Freq: Once | INTRAMUSCULAR | Status: AC
  Administered 2018-12-04: 13:00:00 40 mg via INTRAVENOUS
  Filled 2018-12-04: qty 4

## 2018-12-04 MED ORDER — MAGNESIUM SULFATE 2 GM/50ML IV SOLN
2.0000 g | Freq: Once | INTRAVENOUS | Status: AC
  Administered 2018-12-04: 2 g via INTRAVENOUS
  Filled 2018-12-04: qty 50

## 2018-12-04 MED ORDER — ATORVASTATIN CALCIUM 10 MG PO TABS
20.0000 mg | ORAL_TABLET | Freq: Every day | ORAL | Status: DC
  Administered 2018-12-04 – 2018-12-05 (×2): 20 mg via ORAL
  Filled 2018-12-04 (×3): qty 2

## 2018-12-04 MED ORDER — SPIRONOLACTONE 25 MG PO TABS
25.0000 mg | ORAL_TABLET | Freq: Every day | ORAL | Status: DC
  Administered 2018-12-04 – 2018-12-06 (×3): 25 mg via ORAL
  Filled 2018-12-04 (×3): qty 1

## 2018-12-04 NOTE — Progress Notes (Signed)
Patient had 8-beat run of non-sustaining V-Tach. After entering the patient's room following her V-Tach episode, pt was sleeping. Patient was asymptomatic with no reports of chest pain and reports her shortness of breath not being as severe as it was earlier in the shift. [name] vital signs stable with BP of 150/98. Elmahi,MD was notified and he gave this RN verbal orders for patient to receive a one time dose of IV 2 grams of magnesium sulfate infusion. MD also told RN to ensure the patient has an order for magnesium blood draw for the AM labs, which the patient already had ordered. Orders placed by RN with verbal order with read back. Will continue to monitor and treat per MD orders.

## 2018-12-04 NOTE — Progress Notes (Signed)
Patient ID: Denise [name], female   DOB: April 11, 1945, 73 y.o.   MRN: [id number]  PROGRESS NOTE    Denise [name]  XAJ:[id number] DOB: 01/27/1945 DOA: 11/27/2018 PCP: [name], NP   Brief Narrative:  73 year old female with history of diabetes mellitus type 2 not on treatment, hypertension, COPD with chronic hypoxic respiratory failure on 4 L oxygen, pulmonary hypertension who was recently admitted to the hospital with acute ischemic stroke, status post TPA with hemorrhagic conversion treated with cryoprecipitate and tranexamic acid, went to acute inpatient rehab and discharged home on 11/19/2018 presented back to the ED on 11/27/2018 with worsening weakness on the left side.  MRI showed new right thalamocapsular stroke with old hemorrhagic strokes.  CTA with no acute occlusion, severe stenosis of left vertebral artery origin.  Neurology was consulted.  PT recommended SNF placement.  Assessment & Plan:   Acute ischemic right thalamic stroke, multiple territory stroke with recent ischemic stroke - MRI brain acute infarct centered within the right thalamocapsular junction  - CTA head/neck no large vessel occlusion. No significant stenosis at the ICA origins -Patient was initially placed on aspirin and Plavix as per neurology recommendations but subsequently switched to Eliquis alone.  Neurology has signed off.  Outpatient follow-up with neurology. -PT/OT recommending SNF placement.  Diet as per SLP recommendations. -Discharge to SNF once bed is available  COPD Chronic hypoxic respiratory failure on 4 L oxygen at baseline -Required 6 L oxygen yesterday and a dose of overnight Lasix.  Currently on 4 L oxygen.  Will give 1 more dose of Lasix today. -Continue inhalers.  Continue nebs as needed.  Chest x-ray on 12/03/2018 was negative.  Essential hypertension Hypertensive urgency -Blood pressure still on the higher side. -Monitor blood pressure.  Continue amlodipine, metoprolol.   Restart spironolactone.  Lasix as above.  Diabetes mellitus type 2 -A1c 7 recently.  Continue CBGs with SSI  Prolonged QTC -Improved.  Hyperlipidemia -We will resume Lipitor.  Hypernatremia -Improved  Hypercalcemia -Improving.  DC IV fluids.  Lasix as above   DVT prophylaxis: Eliquis Code Status: Full Family Communication: None at bedside Disposition Plan: SNF once bed is available  Consultants: Neurology  Procedures: None  Antimicrobials: None   Subjective: Patient seen and examined at bedside.  She is a poor historian.  Denies any worsening chest pain, shortness of breath, fever or vomiting.  As per nursing staff, she required more oxygen overnight.  Objective: Vitals:   12/04/18 0650 12/04/18 0758 12/04/18 0816 12/04/18 1221  BP:   (!) 153/111 (!) 167/98  Pulse:   69 63  Resp: (!) 21 20 (!) 22 18  Temp:   (!) 97.4 F (36.3 C) (!) 97.3 F (36.3 C)  TempSrc:   Oral Oral  SpO2:   95% 95%  Weight:      Height:        Intake/Output Summary (Last 24 hours) at 12/04/2018 1428 Last data filed at 12/04/2018 1300 Gross per 24 hour  Intake 560 ml  Output 1575 ml  Net -1015 ml   Filed Weights   11/27/18 1241 11/28/18 0127 12/01/18 0407  Weight: 42.6 kg 43.5 kg 43.8 kg    Examination:  General exam: Appears calm and comfortable.  Poor historian. Respiratory system: Bilateral decreased breath sounds at bases with some basilar crackles.  Intermittently tachypneic  cardiovascular system: S1 & S2 heard, Rate controlled Gastrointestinal system: Abdomen is nondistended, soft and nontender. Normal bowel sounds heard. Extremities: No cyanosis, clubbing; trace lower extremity  edema.  Data Reviewed: I have personally reviewed following labs and imaging studies  CBC: Recent Labs  Lab 12/01/18 0758 12/02/18 0256 12/03/18 0116 12/04/18 1030  WBC 5.0 3.7* 4.1 4.2  NEUTROABS  --  2.3 2.5 2.8  HGB 12.5 11.4* 11.2* 12.1  HCT 40.9 36.7 35.5* 38.8  MCV 85.0 84.8  83.7 84.5  PLT 178 192 182 563   Basic Metabolic Panel: Recent Labs  Lab 11/28/18 1237  11/29/18 0747 12/01/18 0758 12/02/18 0256 12/03/18 0116 12/04/18 1030  NA  --   --  146* 147* 149* 145 143  K 4.5  --  4.2 3.8 3.4* 3.9 3.9  CL  --   --  106 108 109 109 106  CO2  --   --  _0 GLUCOSE  --   --  148* 153* 71 113* 117*  BUN  --   --  25* 29* 33* 31* 21  CREATININE  --   --  1.16* 0.97 0.87 0.76 0.86  CALCIUM  --    < > 11.7* 11.1* 10.5*   11.0* 10.6* 10.9*  MG 1.9  --   --   --  1.9 1.8 2.2  PHOS  --   --   --   --  3.1 2.4* 3.1   < > = values in this interval not displayed.   GFR: Estimated Creatinine Clearance: 40.3 mL/min (by C-G formula based on SCr of 0.86 mg/dL). Liver Function Tests: Recent Labs  Lab 12/01/18 0758 12/02/18 0256 12/03/18 0116 12/04/18 1030  AST 29  --   --   --   ALT 16  --   --   --   ALKPHOS 84  --   --   --   BILITOT 0.9  --   --   --   PROT 6.1*  --   --   --   ALBUMIN 3.3* 3.2* 3.1* 3.3*   No results for input(s): LIPASE, AMYLASE in the last 168 hours. No results for input(s): AMMONIA in the last 168 hours. Coagulation Profile: No results for input(s): INR, PROTIME in the last 168 hours. Cardiac Enzymes: No results for input(s): CKTOTAL, CKMB, CKMBINDEX, TROPONINI in the last 168 hours. BNP (last 3 results) Recent Labs    03/01/18 1218 05/02/18 1249 06/24/18 1111  PROBNP 1,336.0* 910.0* 840.0*   HbA1C: No results for input(s): HGBA1C in the last 72 hours. CBG: Recent Labs  Lab 12/03/18 1827 12/03/18 2122 12/03/18 2318 12/04/18 0813 12/04/18 1219  GLUCAP 125* 210* 157* 151* 129*   Lipid Profile: No results for input(s): CHOL, HDL, LDLCALC, TRIG, CHOLHDL, LDLDIRECT in the last 72 hours. Thyroid Function Tests: No results for input(s): TSH, T4TOTAL, FREET4, T3FREE, THYROIDAB in the last 72 hours. Anemia Panel: No results for input(s): VITAMINB12, FOLATE, FERRITIN, TIBC, IRON, RETICCTPCT in the last 72  hours. Sepsis Labs: No results for input(s): PROCALCITON, LATICACIDVEN in the last 168 hours.  Recent Results (from the past 240 hour(s))  SARS CORONAVIRUS 2 (TAT 6-24 HRS) Nasopharyngeal Nasopharyngeal Swab     Status: None   Collection Time: 11/27/18  2:47 PM   Specimen: Nasopharyngeal Swab  Result Value Ref Range Status   SARS Coronavirus 2 NEGATIVE NEGATIVE Final    Comment: (NOTE) SARS-CoV-2 target nucleic acids are NOT DETECTED. The SARS-CoV-2 RNA is generally detectable in upper and lower respiratory specimens during the acute phase of infection. Negative results do not preclude SARS-CoV-2 infection, do not rule out  co-infections with other pathogens, and should not be used as the sole basis for treatment or other patient management decisions. Negative results must be combined with clinical observations, patient history, and epidemiological information. The expected result is Negative. Fact Sheet for Patients: SugarRoll.be Fact Sheet for Healthcare Providers: https://www.woods-mathews.com/ This test is not yet approved or cleared by the [place] FDA and  has been authorized for detection and/or diagnosis of SARS-CoV-2 by FDA under an Emergency Use Authorization (EUA). This EUA will remain  in effect (meaning this test can be used) for the duration of the COVID-19 declaration under Section 56 4(b)(1) of the Act, 21 U.S.C. section 360bbb-3(b)(1), unless the authorization is terminated or revoked sooner. Performed at [facility], [place] [address] [place], [place] [address]   Culture, Urine     Status: Abnormal (Preliminary result)   Collection Time: 12/01/18  4:36 PM   Specimen: Urine, Random  Result Value Ref Range Status   Specimen Description URINE, RANDOM  Final   Special Requests NONE  Final   Culture (A)  Final    >=100,000 COLONIES/mL ESCHERICHIA COLI CULTURE REINCUBATED FOR BETTER GROWTH Performed at [facility], [place] [address], [place], [place] [address]    Report Status PENDING  Incomplete   Organism ID, Bacteria ESCHERICHIA COLI (A)  Final      Susceptibility   Escherichia coli - MIC*    AMPICILLIN >=32 RESISTANT Resistant     CEFAZOLIN <=4 SENSITIVE Sensitive     CEFTRIAXONE <=1 SENSITIVE Sensitive     CIPROFLOXACIN >=4 RESISTANT Resistant     GENTAMICIN >=16 RESISTANT Resistant     IMIPENEM 0.5 SENSITIVE Sensitive     NITROFURANTOIN <=16 SENSITIVE Sensitive     TRIMETH/SULFA >=320 RESISTANT Resistant     AMPICILLIN/SULBACTAM >=32 RESISTANT Resistant     PIP/TAZO 8 SENSITIVE Sensitive     Extended ESBL NEGATIVE Sensitive     * >=100,000 COLONIES/mL ESCHERICHIA COLI  SARS CORONAVIRUS 2 (TAT 6-24 HRS) Nasopharyngeal Nasopharyngeal Swab     Status: None   Collection Time: 12/03/18  2:58 PM   Specimen: Nasopharyngeal Swab  Result Value Ref Range Status   SARS Coronavirus 2 NEGATIVE NEGATIVE Final    Comment: (NOTE) SARS-CoV-2 target nucleic acids are NOT DETECTED. The SARS-CoV-2 RNA is generally detectable in upper and lower respiratory specimens during the acute phase of infection. Negative results do not preclude SARS-CoV-2 infection, do not rule out co-infections with other pathogens, and should not be used as the sole basis for treatment or other patient management decisions. Negative results must be combined with clinical observations, patient history, and epidemiological information. The expected result is Negative. Fact Sheet for Patients: SugarRoll.be Fact Sheet for Healthcare Providers: https://www.woods-mathews.com/ This test is not yet approved or cleared by the [place] FDA and  has been authorized for detection and/or diagnosis of SARS-CoV-2 by FDA under an Emergency Use Authorization (EUA). This EUA will remain  in effect (meaning this test can be used) for the duration of the COVID-19 declaration under Section  56 4(b)(1) of the Act, 21 U.S.C. section 360bbb-3(b)(1), unless the authorization is terminated or revoked sooner. Performed at [facility], [place] [address], [place], [place] [address]          Radiology Studies: Ct Head Wo Contrast  Result Date: 12/02/2018 CLINICAL DATA:  73 year old female with acute right thalamic infarct, subacute right cerebellar hemorrhagic infarct. EXAM: CT HEAD WITHOUT CONTRAST TECHNIQUE: Contiguous axial images were obtained  from the base of the skull through the vertex without intravenous contrast. COMPARISON:  Brain MRI 11/27/2018 and earlier. FINDINGS: Brain: Expected evolution of hypodensity in the right thalamus since the CT on 11/27/2018 (series 4, image 14). No associated hemorrhage or mass effect. Stable right cerebellum PICA territory infarct with petechial hemorrhage. No significant posterior fossa mass effect. Stable gray-white matter differentiation throughout the brain elsewhere with superimposed smaller chronic left cerebellar and deep gray nuclei infarcts. Small area of cortical encephalomalacia in the posterior right temporal lobe. No midline shift, ventriculomegaly, mass effect, evidence of mass lesion, or evidence of new cortically based infarction. Vascular: Calcified atherosclerosis at the skull base. No suspicious intracranial vascular hyperdensity. Skull: No acute osseous abnormality identified. Sinuses/Orbits: Visualized paranasal sinuses and mastoids are stable and well pneumatized. Other: No acute orbit or scalp soft tissue findings. IMPRESSION: 1. Expected evolution of the right thalamic infarct since 11/27/2018. No associated hemorrhage or mass effect. 2. Stable right cerebellum PICA infarct with petechial hemorrhage, no malignant hemorrhagic transformation or mass effect. 3. No new intracranial abnormality. Electronically Signed   By: Genevie Ann M.D.   On: 12/02/2018 20:24   Dg Chest Port 1 View  Result Date: 12/03/2018 CLINICAL DATA:   Shortness of breath EXAM: PORTABLE CHEST 1 VIEW COMPARISON:  11/27/2018 FINDINGS: Heart is borderline in size. No confluent airspace opacities, effusions or edema. No acute bony abnormality. Aortic atherosclerosis. IMPRESSION: Borderline heart size.  No active disease. Electronically Signed   By: Rolm Baptise M.D.   On: 12/03/2018 21:13        Scheduled Meds:  amLODipine  5 mg Oral Daily   apixaban  5 mg Oral BID   fluticasone furoate-vilanterol  1 puff Inhalation Daily   insulin aspart  0-5 Units Subcutaneous QHS   insulin aspart  0-9 Units Subcutaneous TID WC   mouth rinse  15 mL Mouth Rinse BID   metoprolol tartrate  12.5 mg Oral BID   potassium chloride  40 mEq Oral Daily   Resource ThickenUp Clear   Oral Once   spironolactone  25 mg Oral Daily   umeclidinium bromide  1 puff Inhalation Daily   Continuous Infusions:        Aline August, MD [facility] 12/04/2018, 2:28 PM

## 2018-12-04 NOTE — Progress Notes (Signed)
During shift change, patient was on 6 liters of oxygen with only an oxygen saturation of 85%. Patient claimed that she was more short of breath than usual. Pt was also using her accessory muscles to breathe during this time. Charge RN Sonia Baller notified and Lilia Pro, Respiratory Therapist notified as well. Upon auscultation, patient's lungs were diminished. PRN albuterol treatment given without little relief per the patient. Patient then switched over to a high flow nasal cannula at 6 liters of oxygen. Patient continued to be short of breath with her oxygen saturation level 88% to 90%. Elmahi,MD notified and he gave this RN a verbal order for 8m of IV lasix injection and a STAT portable chest x-ray. Orders placed with verbal with readback. Interventions implemented and patient's oxygen saturation levels improved to the 90's and the patient stated that her shortness of breath had decreased. Patient rested well throughout the rest of the night following this event.

## 2018-12-05 DIAGNOSIS — F329 Major depressive disorder, single episode, unspecified: Secondary | ICD-10-CM

## 2018-12-05 DIAGNOSIS — F419 Anxiety disorder, unspecified: Secondary | ICD-10-CM

## 2018-12-05 LAB — GLUCOSE, CAPILLARY
Glucose-Capillary: 117 mg/dL — ABNORMAL HIGH (ref 70–99)
Glucose-Capillary: 147 mg/dL — ABNORMAL HIGH (ref 70–99)
Glucose-Capillary: 199 mg/dL — ABNORMAL HIGH (ref 70–99)
Glucose-Capillary: 174 mg/dL — ABNORMAL HIGH (ref 70–99)

## 2018-12-05 NOTE — Progress Notes (Signed)
PT Cancellation Note  Patient Details Name: Denise [name] MRN: [id number] DOB: 22-Mar-1945   Cancelled Treatment:    Reason Eval/Treat Not Completed: Other (comment)  Attempted to work with patient,she was sleeping soundly and did not wake to verbal or tactile stimulation. Family present and reports she has been like this since they have been here. Will attempt to try back later if time/schedule allow.   Windell Norfolk, DPT, PN1   Supplemental Physical Therapist [facility]    Pager [phone] Acute Rehab Office [phone]

## 2018-12-05 NOTE — TOC Transition Note (Addendum)
Transition of Care [facility]) - CM/SW Discharge Note   Patient Details  Name: [name] MRN: [id number] Date of Birth: Oct 02, 1945  Transition of [facility]) CM/SW Contact:  Sharin Mons, RN Phone Number: [phone] 12/05/2018, 2:56 PM   Clinical Narrative:    Insurance authorization still pending for GHC/SNF ... TOC team will continue to monitor.    Barriers to Discharge: Insurance Authorization   Patient Goals and CMS Choice Patient states their goals for this hospitalization and ongoing recovery are:: to get better and go home CMS Medicare.gov Compare Post Acute Care list provided to:: Patient    Discharge Placement                       Discharge Plan and Services                                     Social Determinants of Health (SDOH) Interventions     Readmission Risk Interventions No flowsheet data found.

## 2018-12-05 NOTE — Progress Notes (Signed)
Attempted to call report to 3W room 63  Rn stated she will call Rn back.

## 2018-12-05 NOTE — Progress Notes (Signed)
  Speech Language Pathology Treatment: Dysphagia  Patient Details Name: Denise [name] MRN: [id number] DOB: 10-Jul-1945 Today's Date: 12/05/2018 Time: 7615-1834 SLP Time Calculation (min) (ACUTE ONLY): 14 min  Assessment / Plan / Recommendation Clinical Impression  Skilled treatment session focused on dysphagia. SLP recieved pt in bed and was very fatigued but able to arouse for minimal participation. SLP facilitated with skilled observation of pt consuming nectar thick liquids via spoon, pt too fatigued to self-feed. Pt with no overt s/s of aspiration but pt demonstrated increased WOB almost immediately following [name]. In addition pt attempted to talk with Probation officer (pt and this Probation officer know each other from previous admission). [name] pulse ox read 85% but on dynamap pt's O2 read 94%. Education provided to pt's nurse on pt report of being air hungry. Education also completed with pt's husband on most appropriate diet will be dysphagia 1 with nectar thick liquids to also allow for energy conservation. This education provided to pt's nurse as well d/t pending discharge to [employer].     HPI HPI: 73 y.o. female with past medical history significant for type 2 diabetes mellitus, hypertension, COPD, recent stroke  post TPA (11/2) with right cerebellar hemorrhagic transformation with new right thalamic capsular infarction.  CT head shows no significant changes, redemonstrates left vertebral artery severe stenosis.      SLP Plan  Continue with current plan of care       Recommendations  Diet recommendations: Dysphagia 1 (puree);Nectar-thick liquid Liquids provided via: Cup Medication Administration: Crushed with puree Supervision: Staff to assist with self feeding;Full supervision/cueing for compensatory strategies Compensations: Slow rate;Small sips/bites;Follow solids with liquid Postural Changes and/or [name] Maneuvers: Seated upright 90 degrees                Oral Care  Recommendations: Oral care BID Follow up Recommendations: Skilled Nursing facility SLP Visit Diagnosis: Dysphagia, oropharyngeal phase (R13.12) Plan: Continue with current plan of care       Denise [name] 12/05/2018, 3:33 PM

## 2018-12-05 NOTE — Progress Notes (Signed)
Patient ID: Denise [name], female   DOB: Jul 27, 1945, 73 y.o.   MRN: [id number]  PROGRESS NOTE    Denise [name]  KLK:[id number] DOB: 07-19-1945 DOA: 11/27/2018 PCP: [name], NP   Brief Narrative:  73 year old female with history of diabetes mellitus type 2 not on treatment, hypertension, COPD with chronic hypoxic respiratory failure on 4 L oxygen, pulmonary hypertension who was recently admitted to the hospital with acute ischemic stroke, status post TPA with hemorrhagic conversion treated with cryoprecipitate and tranexamic acid, went to acute inpatient rehab and discharged home on 11/19/2018 presented back to the ED on 11/27/2018 with worsening weakness on the left side.  MRI showed new right thalamocapsular stroke with old hemorrhagic strokes.  CTA with no acute occlusion, severe stenosis of left vertebral artery origin.  Neurology was consulted.  PT recommended SNF placement.  Assessment & Plan:   Acute ischemic right thalamic stroke, multiple territory stroke with recent ischemic stroke - MRI brain acute infarct centered within the right thalamocapsular junction  - CTA head/neck no large vessel occlusion. No significant stenosis at the ICA origins -Patient was initially placed on aspirin and Plavix as per neurology recommendations but subsequently switched to Eliquis alone.  Neurology has signed off.  Outpatient follow-up with neurology. -PT/OT recommending SNF placement.  Diet as per SLP recommendations. -Discharge to SNF once bed is available  COPD Chronic hypoxic respiratory failure on 4 L oxygen at baseline -Currently back on 4 L oxygen.  Received few doses of intravenous Lasix intermittently over the last couple of days. -Continue inhalers.  Continue nebs as needed.  Chest x-ray on 12/03/2018 was negative.  Essential hypertension Hypertensive urgency -Blood pressure still on the higher side. -Monitor blood pressure.  Continue amlodipine, metoprolol and  spironolactone.  Diabetes mellitus type 2 -A1c 7 recently.  Continue CBGs with SSI  Prolonged QTC -Improved.  Hyperlipidemia -Continue Lipitor.  Hypernatremia -Improved  Hypercalcemia -Improving.  No labs today.  Off IV fluids.   DVT prophylaxis: Eliquis Code Status: Full Family Communication: None at bedside Disposition Plan: SNF once bed is available.  Medically stable for discharge to SNF.  Consultants: Neurology  Procedures: None  Antimicrobials: None   Subjective: Patient seen and examined at bedside.  Poor historian.  No worsening shortness of breath, fever or vomiting reported. Objective: Vitals:   12/04/18 2033 12/04/18 2130 12/05/18 0443 12/05/18 0836  BP: 124/88 122/79 (!) 139/97   Pulse: (!) 45 (!) 56 69 70  Resp: _0 Temp: 97.6 F (36.4 C) 97.8 F (36.6 C)    TempSrc: Oral Oral    SpO2: 96% 94%  96%  Weight:      Height:        Intake/Output Summary (Last 24 hours) at 12/05/2018 1145 Last data filed at 12/04/2018 1730 Gross per 24 hour  Intake 240 ml  Output 1000 ml  Net -760 ml   Filed Weights   11/27/18 1241 11/28/18 0127 12/01/18 0407  Weight: 42.6 kg 43.5 kg 43.8 kg    Examination:  General exam: No acute distress.  Poor historian. Respiratory system: Bilateral decreased breath sounds at bases basilar crackles.   Cardiovascular system: S1 & S2 heard, intermittently bradycardic Gastrointestinal system: Abdomen is nondistended, soft and nontender. Normal bowel sounds heard. Extremities: No cyanosis; trace lower extremity edema.  Data Reviewed: I have personally reviewed following labs and imaging studies  CBC: Recent Labs  Lab 12/01/18 0758 12/02/18 0256 12/03/18 0116 12/04/18 1030  WBC 5.0 3.7*  4.1 4.2  NEUTROABS  --  2.3 2.5 2.8  HGB 12.5 11.4* 11.2* 12.1  HCT 40.9 36.7 35.5* 38.8  MCV 85.0 84.8 83.7 84.5  PLT 178 192 182 967   Basic Metabolic Panel: Recent Labs  Lab 11/28/18 1237  11/29/18 0747 12/01/18  0758 12/02/18 0256 12/03/18 0116 12/04/18 1030  NA  --   --  146* 147* 149* 145 143  K 4.5  --  4.2 3.8 3.4* 3.9 3.9  CL  --   --  106 108 109 109 106  CO2  --   --  _0 GLUCOSE  --   --  148* 153* 71 113* 117*  BUN  --   --  25* 29* 33* 31* 21  CREATININE  --   --  1.16* 0.97 0.87 0.76 0.86  CALCIUM  --    < > 11.7* 11.1* 10.5*  11.0* 10.6* 10.9*  MG 1.9  --   --   --  1.9 1.8 2.2  PHOS  --   --   --   --  3.1 2.4* 3.1   < > = values in this interval not displayed.   GFR: Estimated Creatinine Clearance: 40.3 mL/min (by C-G formula based on SCr of 0.86 mg/dL). Liver Function Tests: Recent Labs  Lab 12/01/18 0758 12/02/18 0256 12/03/18 0116 12/04/18 1030  AST 29  --   --   --   ALT 16  --   --   --   ALKPHOS 84  --   --   --   BILITOT 0.9  --   --   --   PROT 6.1*  --   --   --   ALBUMIN 3.3* 3.2* 3.1* 3.3*   No results for input(s): LIPASE, AMYLASE in the last 168 hours. No results for input(s): AMMONIA in the last 168 hours. Coagulation Profile: No results for input(s): INR, PROTIME in the last 168 hours. Cardiac Enzymes: No results for input(s): CKTOTAL, CKMB, CKMBINDEX, TROPONINI in the last 168 hours. BNP (last 3 results) Recent Labs    03/01/18 1218 05/02/18 1249 06/24/18 1111  PROBNP 1,336.0* 910.0* 840.0*   HbA1C: No results for input(s): HGBA1C in the last 72 hours. CBG: Recent Labs  Lab 12/04/18 0813 12/04/18 1219 12/04/18 1607 12/04/18 2035 12/05/18 0809  GLUCAP 151* 129* 177* 212* 117*   Lipid Profile: No results for input(s): CHOL, HDL, LDLCALC, TRIG, CHOLHDL, LDLDIRECT in the last 72 hours. Thyroid Function Tests: No results for input(s): TSH, T4TOTAL, FREET4, T3FREE, THYROIDAB in the last 72 hours. Anemia Panel: No results for input(s): VITAMINB12, FOLATE, FERRITIN, TIBC, IRON, RETICCTPCT in the last 72 hours. Sepsis Labs: No results for input(s): PROCALCITON, LATICACIDVEN in the last 168 hours.  Recent Results (from the  past 240 hour(s))  SARS CORONAVIRUS 2 (TAT 6-24 HRS) Nasopharyngeal Nasopharyngeal Swab     Status: None   Collection Time: 11/27/18  2:47 PM   Specimen: Nasopharyngeal Swab  Result Value Ref Range Status   SARS Coronavirus 2 NEGATIVE NEGATIVE Final    Comment: (NOTE) SARS-CoV-2 target nucleic acids are NOT DETECTED. The SARS-CoV-2 RNA is generally detectable in upper and lower respiratory specimens during the acute phase of infection. Negative results do not preclude SARS-CoV-2 infection, do not rule out co-infections with other pathogens, and should not be used as the sole basis for treatment or other patient management decisions. Negative results must be combined with clinical observations, patient history, and epidemiological  information. The expected result is Negative. Fact Sheet for Patients: SugarRoll.be Fact Sheet for Healthcare Providers: https://www.woods-mathews.com/ This test is not yet approved or cleared by the [place] FDA and  has been authorized for detection and/or diagnosis of SARS-CoV-2 by FDA under an Emergency Use Authorization (EUA). This EUA will remain  in effect (meaning this test can be used) for the duration of the COVID-19 declaration under Section 56 4(b)(1) of the Act, 21 U.S.C. section 360bbb-3(b)(1), unless the authorization is terminated or revoked sooner. Performed at [facility], [place] [address] [place], [place] [address]   Culture, Urine     Status: Abnormal (Preliminary result)   Collection Time: 12/01/18  4:36 PM   Specimen: Urine, Random  Result Value Ref Range Status   Specimen Description URINE, RANDOM  Final   Special Requests   Final    NONE Performed at [facility], [place] [address] [place], [place] [address]    Culture >=100,000 COLONIES/mL ESCHERICHIA COLI (A)  Final   Report Status PENDING  Incomplete   Organism ID, Bacteria ESCHERICHIA COLI (A)  Final       Susceptibility   Escherichia coli - MIC*    AMPICILLIN >=32 RESISTANT Resistant     CEFAZOLIN <=4 SENSITIVE Sensitive     CEFTRIAXONE <=1 SENSITIVE Sensitive     CIPROFLOXACIN >=4 RESISTANT Resistant     GENTAMICIN >=16 RESISTANT Resistant     IMIPENEM 0.5 SENSITIVE Sensitive     NITROFURANTOIN <=16 SENSITIVE Sensitive     TRIMETH/SULFA >=320 RESISTANT Resistant     AMPICILLIN/SULBACTAM >=32 RESISTANT Resistant     PIP/TAZO 8 SENSITIVE Sensitive     Extended ESBL NEGATIVE Sensitive     * >=100,000 COLONIES/mL ESCHERICHIA COLI  SARS CORONAVIRUS 2 (TAT 6-24 HRS) Nasopharyngeal Nasopharyngeal Swab     Status: None   Collection Time: 12/03/18  2:58 PM   Specimen: Nasopharyngeal Swab  Result Value Ref Range Status   SARS Coronavirus 2 NEGATIVE NEGATIVE Final    Comment: (NOTE) SARS-CoV-2 target nucleic acids are NOT DETECTED. The SARS-CoV-2 RNA is generally detectable in upper and lower respiratory specimens during the acute phase of infection. Negative results do not preclude SARS-CoV-2 infection, do not rule out co-infections with other pathogens, and should not be used as the sole basis for treatment or other patient management decisions. Negative results must be combined with clinical observations, patient history, and epidemiological information. The expected result is Negative. Fact Sheet for Patients: SugarRoll.be Fact Sheet for Healthcare Providers: https://www.woods-mathews.com/ This test is not yet approved or cleared by the [place] FDA and  has been authorized for detection and/or diagnosis of SARS-CoV-2 by FDA under an Emergency Use Authorization (EUA). This EUA will remain  in effect (meaning this test can be used) for the duration of the COVID-19 declaration under Section 56 4(b)(1) of the Act, 21 U.S.C. section 360bbb-3(b)(1), unless the authorization is terminated or revoked sooner. Performed at [facility],  [place] [address] [place], [place] [address]          Radiology Studies: Dg Chest Port 1 View  Result Date: 12/03/2018 CLINICAL DATA:  Shortness of breath EXAM: PORTABLE CHEST 1 VIEW COMPARISON:  11/27/2018 FINDINGS: Heart is borderline in size. No confluent airspace opacities, effusions or edema. No acute bony abnormality. Aortic atherosclerosis. IMPRESSION: Borderline heart size.  No active disease. Electronically Signed   By: Rolm Baptise M.D.   On: 12/03/2018 21:13  Scheduled Meds: . amLODipine  5 mg Oral Daily  . apixaban  5 mg Oral BID  . atorvastatin  20 mg Oral QHS  . fluticasone furoate-vilanterol  1 puff Inhalation Daily  . insulin aspart  0-5 Units Subcutaneous QHS  . insulin aspart  0-9 Units Subcutaneous TID WC  . mouth rinse  15 mL Mouth Rinse BID  . metoprolol tartrate  12.5 mg Oral BID  . potassium chloride  40 mEq Oral Daily  . Resource ThickenUp Clear   Oral Once  . spironolactone  25 mg Oral Daily  . umeclidinium bromide  1 puff Inhalation Daily   Continuous Infusions:        Aline August, MD [facility] 12/05/2018, 11:45 AM

## 2018-12-05 NOTE — Progress Notes (Signed)
Pt transferred from 5W with stoke diagnosis, pt alert and oriented with soft voice, denies any pain at this time, pt settled in bed with call light within pt's reach, safety measures explained and initiated as well, tele monitor put and verified on pt, was however reassured and will continue to monitor, v/s stable. [place]-Asidi, Carmalita Wakefield Efe

## 2018-12-06 DIAGNOSIS — M2550 Pain in unspecified joint: Secondary | ICD-10-CM | POA: Diagnosis not present

## 2018-12-06 DIAGNOSIS — J9611 Chronic respiratory failure with hypoxia: Secondary | ICD-10-CM | POA: Diagnosis not present

## 2018-12-06 DIAGNOSIS — F419 Anxiety disorder, unspecified: Secondary | ICD-10-CM | POA: Diagnosis not present

## 2018-12-06 DIAGNOSIS — J439 Emphysema, unspecified: Secondary | ICD-10-CM | POA: Diagnosis not present

## 2018-12-06 DIAGNOSIS — E1165 Type 2 diabetes mellitus with hyperglycemia: Secondary | ICD-10-CM | POA: Diagnosis not present

## 2018-12-06 DIAGNOSIS — R262 Difficulty in walking, not elsewhere classified: Secondary | ICD-10-CM | POA: Diagnosis not present

## 2018-12-06 DIAGNOSIS — Z7901 Long term (current) use of anticoagulants: Secondary | ICD-10-CM | POA: Diagnosis not present

## 2018-12-06 DIAGNOSIS — F411 Generalized anxiety disorder: Secondary | ICD-10-CM | POA: Diagnosis not present

## 2018-12-06 DIAGNOSIS — I63211 Cerebral infarction due to unspecified occlusion or stenosis of right vertebral arteries: Secondary | ICD-10-CM | POA: Diagnosis not present

## 2018-12-06 DIAGNOSIS — R600 Localized edema: Secondary | ICD-10-CM | POA: Diagnosis not present

## 2018-12-06 DIAGNOSIS — Z79899 Other long term (current) drug therapy: Secondary | ICD-10-CM | POA: Diagnosis not present

## 2018-12-06 DIAGNOSIS — J80 Acute respiratory distress syndrome: Secondary | ICD-10-CM | POA: Diagnosis not present

## 2018-12-06 DIAGNOSIS — I5033 Acute on chronic diastolic (congestive) heart failure: Secondary | ICD-10-CM | POA: Diagnosis not present

## 2018-12-06 DIAGNOSIS — Z7401 Bed confinement status: Secondary | ICD-10-CM | POA: Diagnosis not present

## 2018-12-06 DIAGNOSIS — Z20828 Contact with and (suspected) exposure to other viral communicable diseases: Secondary | ICD-10-CM | POA: Diagnosis not present

## 2018-12-06 DIAGNOSIS — E785 Hyperlipidemia, unspecified: Secondary | ICD-10-CM | POA: Diagnosis not present

## 2018-12-06 DIAGNOSIS — E210 Primary hyperparathyroidism: Secondary | ICD-10-CM | POA: Diagnosis not present

## 2018-12-06 DIAGNOSIS — Z9981 Dependence on supplemental oxygen: Secondary | ICD-10-CM | POA: Diagnosis not present

## 2018-12-06 DIAGNOSIS — Z209 Contact with and (suspected) exposure to unspecified communicable disease: Secondary | ICD-10-CM | POA: Diagnosis not present

## 2018-12-06 DIAGNOSIS — E43 Unspecified severe protein-calorie malnutrition: Secondary | ICD-10-CM | POA: Diagnosis not present

## 2018-12-06 DIAGNOSIS — K219 Gastro-esophageal reflux disease without esophagitis: Secondary | ICD-10-CM | POA: Diagnosis not present

## 2018-12-06 DIAGNOSIS — J9621 Acute and chronic respiratory failure with hypoxia: Secondary | ICD-10-CM | POA: Diagnosis not present

## 2018-12-06 DIAGNOSIS — Z87891 Personal history of nicotine dependence: Secondary | ICD-10-CM | POA: Diagnosis not present

## 2018-12-06 DIAGNOSIS — I6930 Unspecified sequelae of cerebral infarction: Secondary | ICD-10-CM | POA: Diagnosis not present

## 2018-12-06 DIAGNOSIS — I63511 Cerebral infarction due to unspecified occlusion or stenosis of right middle cerebral artery: Secondary | ICD-10-CM | POA: Diagnosis not present

## 2018-12-06 DIAGNOSIS — Z7951 Long term (current) use of inhaled steroids: Secondary | ICD-10-CM | POA: Diagnosis not present

## 2018-12-06 DIAGNOSIS — I6350 Cerebral infarction due to unspecified occlusion or stenosis of unspecified cerebral artery: Secondary | ICD-10-CM | POA: Diagnosis not present

## 2018-12-06 DIAGNOSIS — I5023 Acute on chronic systolic (congestive) heart failure: Secondary | ICD-10-CM | POA: Diagnosis not present

## 2018-12-06 DIAGNOSIS — I110 Hypertensive heart disease with heart failure: Secondary | ICD-10-CM | POA: Diagnosis not present

## 2018-12-06 DIAGNOSIS — I509 Heart failure, unspecified: Secondary | ICD-10-CM | POA: Diagnosis not present

## 2018-12-06 DIAGNOSIS — I10 Essential (primary) hypertension: Secondary | ICD-10-CM | POA: Diagnosis not present

## 2018-12-06 DIAGNOSIS — R5381 Other malaise: Secondary | ICD-10-CM | POA: Diagnosis not present

## 2018-12-06 DIAGNOSIS — Z85038 Personal history of other malignant neoplasm of large intestine: Secondary | ICD-10-CM | POA: Diagnosis not present

## 2018-12-06 DIAGNOSIS — I69391 Dysphagia following cerebral infarction: Secondary | ICD-10-CM | POA: Diagnosis not present

## 2018-12-06 DIAGNOSIS — J9622 Acute and chronic respiratory failure with hypercapnia: Secondary | ICD-10-CM | POA: Diagnosis not present

## 2018-12-06 DIAGNOSIS — G4700 Insomnia, unspecified: Secondary | ICD-10-CM | POA: Diagnosis not present

## 2018-12-06 DIAGNOSIS — J449 Chronic obstructive pulmonary disease, unspecified: Secondary | ICD-10-CM | POA: Diagnosis not present

## 2018-12-06 DIAGNOSIS — E119 Type 2 diabetes mellitus without complications: Secondary | ICD-10-CM | POA: Diagnosis not present

## 2018-12-06 DIAGNOSIS — E870 Hyperosmolality and hypernatremia: Secondary | ICD-10-CM | POA: Diagnosis not present

## 2018-12-06 DIAGNOSIS — I160 Hypertensive urgency: Secondary | ICD-10-CM | POA: Diagnosis not present

## 2018-12-06 DIAGNOSIS — J441 Chronic obstructive pulmonary disease with (acute) exacerbation: Secondary | ICD-10-CM | POA: Diagnosis not present

## 2018-12-06 DIAGNOSIS — J9612 Chronic respiratory failure with hypercapnia: Secondary | ICD-10-CM | POA: Diagnosis not present

## 2018-12-06 DIAGNOSIS — E559 Vitamin D deficiency, unspecified: Secondary | ICD-10-CM | POA: Diagnosis not present

## 2018-12-06 DIAGNOSIS — I272 Pulmonary hypertension, unspecified: Secondary | ICD-10-CM | POA: Diagnosis not present

## 2018-12-06 DIAGNOSIS — Z8673 Personal history of transient ischemic attack (TIA), and cerebral infarction without residual deficits: Secondary | ICD-10-CM | POA: Diagnosis not present

## 2018-12-06 DIAGNOSIS — R0602 Shortness of breath: Secondary | ICD-10-CM | POA: Diagnosis not present

## 2018-12-06 DIAGNOSIS — E1151 Type 2 diabetes mellitus with diabetic peripheral angiopathy without gangrene: Secondary | ICD-10-CM | POA: Diagnosis not present

## 2018-12-06 DIAGNOSIS — M6281 Muscle weakness (generalized): Secondary | ICD-10-CM | POA: Diagnosis not present

## 2018-12-06 DIAGNOSIS — R1312 Dysphagia, oropharyngeal phase: Secondary | ICD-10-CM | POA: Diagnosis not present

## 2018-12-06 DIAGNOSIS — I491 Atrial premature depolarization: Secondary | ICD-10-CM | POA: Diagnosis not present

## 2018-12-06 DIAGNOSIS — I4891 Unspecified atrial fibrillation: Secondary | ICD-10-CM | POA: Diagnosis not present

## 2018-12-06 DIAGNOSIS — F329 Major depressive disorder, single episode, unspecified: Secondary | ICD-10-CM | POA: Diagnosis not present

## 2018-12-06 LAB — COMPREHENSIVE METABOLIC PANEL
ALT: 18 U/L (ref 0–44)
AST: 29 U/L (ref 15–41)
Albumin: 3.6 g/dL (ref 3.5–5.0)
Alkaline Phosphatase: 88 U/L (ref 38–126)
Anion gap: 11 (ref 5–15)
BUN: 28 mg/dL — ABNORMAL HIGH (ref 8–23)
CO2: 24 mmol/L (ref 22–32)
Calcium: 11.2 mg/dL — ABNORMAL HIGH (ref 8.9–10.3)
Chloride: 109 mmol/L (ref 98–111)
Creatinine, Ser: 0.82 mg/dL (ref 0.44–1.00)
GFR calc Af Amer: >60 mL/min (ref >60)
GFR calc non Af Amer: >60 mL/min (ref >60)
Glucose, Bld: 122 mg/dL — ABNORMAL HIGH (ref 70–99)
Potassium: 4.8 mmol/L (ref 3.5–5.1)
Sodium: 144 mmol/L (ref 135–145)
Total Bilirubin: 0.6 mg/dL (ref 0.3–1.2)
Total Protein: 6.2 g/dL — ABNORMAL LOW (ref 6.5–8.1)

## 2018-12-06 LAB — CBC
HCT: 43.5 % (ref 36.0–46.0)
Hemoglobin: 13.7 g/dL (ref 12.0–15.0)
MCH: 26.7 pg (ref 26.0–34.0)
MCHC: 31.5 g/dL (ref 30.0–36.0)
MCV: 84.6 fL (ref 80.0–100.0)
Platelets: 248 10*3/uL (ref 150–400)
RBC: 5.14 MIL/uL — ABNORMAL HIGH (ref 3.87–5.11)
RDW: 17 % — ABNORMAL HIGH (ref 11.5–15.5)
WBC: 4.4 10*3/uL (ref 4.0–10.5)
nRBC: 0 % (ref 0.0–0.2)

## 2018-12-06 LAB — GLUCOSE, CAPILLARY: Glucose-Capillary: 110 mg/dL — ABNORMAL HIGH (ref 70–99)

## 2018-12-06 MED ORDER — ATORVASTATIN CALCIUM 20 MG PO TABS: 20.0000 mg | ORAL_TABLET | Freq: Every day | ORAL | 0 refills | Status: DC

## 2018-12-06 MED ORDER — APIXABAN 5 MG PO TABS: 5.0000 mg | ORAL_TABLET | Freq: Two times a day (BID) | ORAL | 0 refills | Status: DC

## 2018-12-06 NOTE — Progress Notes (Addendum)
Pt stable for transport to [facility]. Full report given to receiving RN [place]. [name] at bedside. Pt PIV removed without complication. Telemetry removed and placed in appropriate cabinet at nurses's station. Pt escorted off of unit in paper scrubs by PTAR.

## 2018-12-06 NOTE — Progress Notes (Signed)
Physical Therapy Treatment Patient Details Name: Denise [name] MRN: [id number] DOB: [birth year] Today's Date: 12/06/2018    History of Present Illness Pt is a 73 y/o female admitted with increasing L sided weakness and lethargy. Pt found to have new right thalamic capsular infarct. Pt with recent (11/2)  R cerebellar infarct s/p tPA w/ hemorrhagic transformation, pt also has small infarcts L sided cerebrum. PMH - DM, HTN, colon cancer, COPD, chronic respiratory failure with hypoxia, systolic CHF.    PT Comments    Pt performed short bouts of gait in room to move from bed to bed side commode and from commode to recliner chair.  Currently she requires min-mod assistance +2/  Noted DOE on 4L HFNC.  Continue to recommend SNF placement to improve strength and function before returning home.    Follow Up Recommendations  SNF     Equipment Recommendations  None recommended by PT    Recommendations for Other Services Rehab consult     Precautions / Restrictions Precautions Precautions: Fall Precaution Comments: monitor SpO2 sats (on 4L of O2 via nasal cannula baseline) Restrictions Weight Bearing Restrictions: Yes    Mobility  Bed Mobility Overal bed mobility: Needs Assistance Bed Mobility: Supine to Sit;Sit to Supine Rolling: Min assist   Supine to sit: Mod assist     General bed mobility comments: Mod assistance to elevate trunk into sitting.  Pt seated edge of bed with posterior pelvic tilt.  Pt required cues for hand placement and once scooted forward balanced improved with B foot placement on floor.  Transfers Overall transfer level: Needs assistance Equipment used: Rolling walker (2 wheeled) Transfers: Sit to/from Stand Sit to Stand: Min assist Stand pivot transfers: Min assist       General transfer comment: Cues for hand placement, hand over hand for L side to place hand on hand grip of RW and armrests of chair and commode.  Ambulation/Gait Ambulation/Gait  assistance: Mod assist Gait Distance (Feet): 4 Feet(x2, from bed to bedside commode and from commode to recliner.) Assistive device: Rolling walker (2 wheeled) Gait Pattern/deviations: Step-to pattern;Shuffle;Trunk flexed Gait velocity: decr   General Gait Details: Pt required moderate assistance to shift weight and turn and maintain position of RW.   Stairs             Wheelchair Mobility    Modified Rankin (Stroke Patients Only) Modified Rankin (Stroke Patients Only) Pre-Morbid Rankin Score: Moderately severe disability Modified Rankin: Moderately severe disability     Balance Overall balance assessment: Needs assistance   Sitting balance-Leahy Scale: Fair Sitting balance - Comments: supervision for sitting balance   Standing balance support: During functional activity;Single extremity supported Standing balance-Leahy Scale: Poor Standing balance comment: reliant on external support and BUE placement.                            Cognition Arousal/Alertness: Awake/alert Behavior During Therapy: Flat affect Overall Cognitive Status: Impaired/Different from baseline Area of Impairment: Orientation;Problem solving                 Orientation Level: Disoriented to;Situation;Time Current Attention Level: Sustained Memory: Decreased recall of precautions;Decreased short-term memory Following Commands: Follows one step commands consistently Safety/Judgement: Decreased awareness of safety;Decreased awareness of deficits   Problem Solving: Slow processing;Requires verbal cues General Comments: slow processing and slowed responses verbally but appropriate throughout session      Exercises      General Comments  Pertinent Vitals/Pain Pain Assessment: No/denies pain Faces Pain Scale: No hurt    Home Living Family/patient expects to be discharged to:: Inpatient rehab Living Arrangements: Spouse/significant other                   Prior Function            PT Goals (current goals can now be found in the care plan section) Acute Rehab PT Goals Patient Stated Goal: to go home Potential to Achieve Goals: Good    Frequency    Min 3X/week      PT Plan Current plan remains appropriate    Co-evaluation              AM-PAC PT "6 Clicks" Mobility   Outcome Measure  Help needed turning from your back to your side while in a flat bed without using bedrails?: A Little Help needed moving from lying on your back to sitting on the side of a flat bed without using bedrails?: A Lot Help needed moving to and from a bed to a chair (including a wheelchair)?: A Lot Help needed standing up from a chair using your arms (e.g., wheelchair or bedside chair)?: A Little Help needed to walk in hospital room?: A Lot Help needed climbing 3-5 steps with a railing? : Total 6 Click Score: 13    End of Session Equipment Utilized During Treatment: Gait belt;Oxygen(4L HFNC) Activity Tolerance: Patient limited by fatigue Patient left: in chair;with chair alarm set;with call bell/phone within reach Nurse Communication: Mobility status PT Visit Diagnosis: Hemiplegia and hemiparesis;Other abnormalities of gait and mobility (R26.89);Muscle weakness (generalized) (M62.81) Hemiplegia - Right/Left: Left Hemiplegia - dominant/non-dominant: Non-dominant Hemiplegia - caused by: Cerebral infarction     Time: 0912-0938 PT Time Calculation (min) (ACUTE ONLY): 26 min  Charges:  $Gait Training: 8-22 mins $Therapeutic Activity: 8-22 mins                     Erasmo Leventhal , PTA Acute Rehabilitation Services Pager [phone] Office [phone]     Litha Lamartina Eli Hose 12/06/2018, 10:11 AM

## 2018-12-06 NOTE — Discharge Summary (Signed)
Physician Discharge Summary  Denise [name] QVZ:[id number] DOB: 15-Jan-1945 DOA: 11/27/2018  PCP: [name], NP  Admit date: 11/27/2018 Discharge date: 12/06/2018  Admitted From: Home Disposition:SNF  Recommendations for Outpatient Follow-up:  1. Follow up with SNF provider at earliest convenience with repeat CBC/BMP  2. Outpatient follow-up with neurology 3. follow up in ED if symptoms worsen or new appear   Home Health: No Equipment/Devices: None  Discharge Condition: Stable CODE STATUS: Full Diet recommendation: Heart healthy/carb modified/diet as per SLP recommendations  Brief/Interim Summary: 73 year old female with history of diabetes mellitus type 2 not on treatment, hypertension, COPD with chronic hypoxic respiratory failure on 4 L oxygen, pulmonary hypertension who was recently admitted to the hospital with acute ischemic stroke, status post TPA with hemorrhagic conversion treated with cryoprecipitate and tranexamic acid, went to acute inpatient rehab and discharged home on 11/19/2018 presented back to the ED on 11/27/2018 with worsening weakness on the left side.  MRI showed new right thalamocapsular stroke with old hemorrhagic strokes.  CTA with no acute occlusion, severe stenosis of left vertebral artery origin.  Neurology was consulted.  PT recommended SNF placement.  Patient is currently on Eliquis as per neurology recommendations.  Outpatient follow-up with neurology.  She is medically stable for discharge to SNF.  Discharge to SNF once bed is available.  Discharge Diagnoses:  Acute ischemic right thalamic stroke, multiple territory stroke with recent ischemic stroke - MRI brain acute infarct centered within the right thalamocapsular junction  - CTA head/neck no large vessel occlusion. No significant stenosis at the ICA origins -Patient was initially placed on aspirin and Plavix as per neurology recommendations but subsequently switched to Eliquis alone.   Neurology has signed off.  Outpatient follow-up with neurology. -PT/OT recommending SNF placement.  Diet as per SLP recommendations. -Discharge to SNF once bed is available  COPD Chronic hypoxic respiratory failure on 4 L oxygen at baseline -Currently back on 4 L oxygen.  Received few doses of intravenous Lasix intermittently over the last couple of days. -Continue inhalers.  Continue nebs as needed.  Chest x-ray on 12/03/2018 was negative.  Essential hypertension Hypertensive urgency -Blood pressure still on the higher side. -Monitor blood pressure.  Neurology had recommended permissive hypertension for 5 to 7 days.   Resume metoprolol, spironolactone and Lasix.  BiDil on hold for now, this can be resumed at SNF if blood pressure remains elevated.  Diabetes mellitus type 2 -A1c 7 recently.    Carb modified diet.  Outpatient follow-up.  Prolonged QTC -Improved.  Hyperlipidemia -Continue Lipitor.  Hypernatremia -Improved  Hypercalcemia -Improving.  No labs today.  Off IV fluids.   Discharge Instructions  Discharge Instructions    Ambulatory referral to Neurology   Complete by: As directed    Follow up with stroke clinic NP (Jessica Vanschaick or Cecille Rubin, if both not available, consider Zachery Dauer, or [facility]) at [facility] in about 4 weeks. Thanks.   Diet - low sodium heart healthy   Complete by: As directed    Diet Carb Modified   Complete by: As directed    Increase activity slowly   Complete by: As directed      Allergies as of 12/06/2018   No Known Allergies     Medication List    STOP taking these medications   aspirin 325 MG EC tablet   bisoprolol 5 MG tablet Commonly known as: ZEBETA   isosorbide-hydrALAZINE 20-37.5 MG tablet Commonly known as: BIDIL     TAKE these medications  Accu-Chek Aviva Plus w/Device Kit Use as instructed to test blood sugar up to 3 times daily What changed:   how much to take  how to take this  when to take  this  additional instructions   Accu-Chek Aviva Soln USE AS INSTRUCTED TO TEST BLOOD SUGAR DAILY What changed:   how much to take  how to take this  when to take this  additional instructions   accu-chek soft touch lancets Use as instructed to test blood sugar up to 3 times daily What changed:   how much to take  how to take this  when to take this  additional instructions   albuterol 108 (90 Base) MCG/ACT inhaler Commonly known as: VENTOLIN HFA INHALE 1-2 PUFFS INTO THE LUNGS EVERY 4 HOURS AS NEEDED FOR WHEEZING OR SHORTNESS OF BREATH. What changed:   how much to take  how to take this  when to take this  reasons to take this   apixaban 5 MG Tabs tablet Commonly known as: ELIQUIS Take 1 tablet (5 mg total) by mouth 2 (two) times daily.   atorvastatin 20 MG tablet Commonly known as: LIPITOR Take 1 tablet (20 mg total) by mouth at bedtime.   Breo Ellipta 200-25 MCG/INH Aepb Generic drug: fluticasone furoate-vilanterol Inhale 1 puff into the lungs daily.   furosemide 20 MG tablet Commonly known as: LASIX Take 40 mg by mouth daily.   glucose blood test strip Commonly known as: Accu-Chek Aviva Plus Use as instructed to test blood sugar up to 3 times daily   metoprolol tartrate 25 MG tablet Commonly known as: LOPRESSOR Take 0.5 tablets (12.5 mg total) by mouth 2 (two) times daily.   mirtazapine 15 MG tablet Commonly known as: REMERON TAKE 1 TABLET BY MOUTH AT BEDTIME. FOR DEPRESSION AND APPETITE. What changed:   how much to take  how to take this  when to take this  additional instructions   OXYGEN Inhale 4 L into the lungs continuous.   potassium chloride 10 MEQ tablet Commonly known as: Klor-Con M10 Take 1 tablet (10 mEq total) by mouth daily. What changed: how much to take   spironolactone 25 MG tablet Commonly known as: ALDACTONE Take 1 tablet (25 mg total) by mouth daily. Take at night   umeclidinium bromide 62.5 MCG/INH  Aepb Commonly known as: INCRUSE ELLIPTA Inhale 1 puff into the lungs daily.       Contact information for follow-up providers    [facility]. Schedule an appointment as soon as possible for a visit in 4 week(s).   Specialty: Neurology Contact information: [address] [place] [phone]           Contact information for after-discharge care    Destination    [facility] .   Service: Skilled Nursing Contact information: [address] [place] [phone]                 No Known Allergies  Consultations:  Neurology   Procedures/Studies: Ct Angio Head W Or Wo Contrast  Result Date: 11/27/2018 CLINICAL DATA:  Facial weakness, left arm weakness, subacute cerebellar infarct EXAM: CT ANGIOGRAPHY HEAD AND NECK TECHNIQUE: Multidetector CT imaging of the head and neck was performed using the standard protocol during bolus administration of intravenous contrast. Multiplanar CT image reconstructions and MIPs were obtained to evaluate the vascular anatomy. Carotid stenosis measurements (when applicable) are obtained utilizing NASCET criteria, using the distal  internal carotid diameter as the denominator. CONTRAST:  39m OMNIPAQUE IOHEXOL 350 MG/ML SOLN COMPARISON:  None. FINDINGS: Motion artifact is present. CTA NECK FINDINGS Aortic arch: Aortic arch is minimally imaged. Great vessel origins are patent. Right carotid system: Common, internal, and external carotid arteries are patent. Mild calcified plaque is present at the proximal ICA without measurable stenosis. Left carotid system: Common, internal, and external carotid arteries are patent. Small focus of plaque ulceration at the left ICA origin. There is no measurable stenosis. Vertebral arteries: Dominant right vertebral artery is patent with mild calcified plaque near the origin. Left vertebral artery is patent with  calcified plaque causing apparent high-grade stenosis but no evidence of flow limitation. Skeleton: Degenerative changes of the cervical spine. Other neck: Mildly enlarged and heterogeneous thyroid. No neck mass or adenopathy within limitation of artifact. Upper chest: Severe emphysema. Review of the MIP images confirms the above findings CTA HEAD FINDINGS Anterior circulation: Intracranial internal carotid arteries are patent with atherosclerotic irregularity and mild calcified plaque. Anterior and middle cerebral arteries are patent. Posterior circulation: Intracranial vertebral arteries are patent. There is moderate stenosis of the left vertebral artery. Basilar artery is patent. Posterior cerebral arteries are patent. Venous sinuses: As permitted by contrast timing, patent. Anatomic variants: Diminutive bilateral posterior communicating arteries. Review of the MIP images confirms the above findings IMPRESSION: No large vessel occlusion. No significant stenosis at the ICA origins. Plaque at the left vertebral artery origin causing severe stenosis but without evidence of flow limitation. Moderate stenosis of the intracranial left vertebral artery. Electronically Signed   By: PMacy MisM.D.   On: 11/27/2018 15:43   Ct Head Wo Contrast  Result Date: 12/02/2018 CLINICAL DATA:  73year old female with acute right thalamic infarct, subacute right cerebellar hemorrhagic infarct. EXAM: CT HEAD WITHOUT CONTRAST TECHNIQUE: Contiguous axial images were obtained from the base of the skull through the vertex without intravenous contrast. COMPARISON:  Brain MRI 11/27/2018 and earlier. FINDINGS: Brain: Expected evolution of hypodensity in the right thalamus since the CT on 11/27/2018 (series 4, image 14). No associated hemorrhage or mass effect. Stable right cerebellum PICA territory infarct with petechial hemorrhage. No significant posterior fossa mass effect. Stable gray-white matter differentiation throughout the  brain elsewhere with superimposed smaller chronic left cerebellar and deep gray nuclei infarcts. Small area of cortical encephalomalacia in the posterior right temporal lobe. No midline shift, ventriculomegaly, mass effect, evidence of mass lesion, or evidence of new cortically based infarction. Vascular: Calcified atherosclerosis at the skull base. No suspicious intracranial vascular hyperdensity. Skull: No acute osseous abnormality identified. Sinuses/Orbits: Visualized paranasal sinuses and mastoids are stable and well pneumatized. Other: No acute orbit or scalp soft tissue findings. IMPRESSION: 1. Expected evolution of the right thalamic infarct since 11/27/2018. No associated hemorrhage or mass effect. 2. Stable right cerebellum PICA infarct with petechial hemorrhage, no malignant hemorrhagic transformation or mass effect. 3. No new intracranial abnormality. Electronically Signed   By: HGenevie AnnM.D.   On: 12/02/2018 20:24   Ct Head Wo Contrast  Result Date: 11/27/2018 CLINICAL DATA:  Left arm weakness, facial weakness, history of stroke, increased drowsiness EXAM: CT HEAD WITHOUT CONTRAST TECHNIQUE: Contiguous axial images were obtained from the base of the skull through the vertex without intravenous contrast. COMPARISON:  11/07/2018, MR brain, 11/05/2018 FINDINGS: Brain: There is decreased size and hyperdensity of a subacute hemorrhagic infarction of the right cerebellar hemisphere (series 2, image 7). Decreased size and conspicuity of or a subacute edematous, nonhemorrhagic infarction of the  left cerebellar hemisphere (series 2, image 6). Periventricular white matter hypodensity. Redemonstrated encephalomalacia of the left frontal lobe (series 3, image 20). Vascular: No hyperdense vessel or unexpected calcification. Skull: Normal. Negative for fracture or focal lesion. Sinuses/Orbits: No acute finding. Other: None. IMPRESSION: 1. There is decreased size and hyperdensity of a subacute hemorrhagic  infarction of the right cerebellar hemisphere (series 2, image 7). 2. Decreased size and conspicuity of or a subacute edematous, nonhemorrhagic infarction of the left cerebellar hemisphere (series 2, image 6). 3.  Nonacute encephalomalacia of the left frontal lobe. 4.  Small-vessel white matter disease. Electronically Signed   By: Eddie Candle M.D.   On: 11/27/2018 15:11   Ct Head Wo Contrast  Result Date: 11/07/2018 CLINICAL DATA:  Stroke follow-up EXAM: CT HEAD WITHOUT CONTRAST TECHNIQUE: Contiguous axial images were obtained from the base of the skull through the vertex without intravenous contrast. COMPARISON:  CT and brain MRI from 2 days ago FINDINGS: Brain: Unchanged area of hemorrhage in the inferior right cerebellum with regional edema. Measurement is confounded by mixed density from brain parenchyma intervening blood. Local mass effect with fourth ventricular narrowing is stable. New small to moderate superficial left inferior cerebellar infarct. Small cortical infarcts along the right cerebral convexity that are known from comparison MRI. Nonacute small left superior frontal cortex infarct. No hydrocephalus or extra-axial collection. Vascular: Atherosclerosis.  No hyperdense vessel. Skull: Negative Sinuses/Orbits: Negative These results were communicated to at 9:41 amon 11/5/2020by text page via the [facility] messaging system. IMPRESSION: 1. New small to moderate infarct in the lateral left cerebellum. 2. Unchanged appearance of hemorrhagic infarct with swelling in the inferior right cerebellum. No hydrocephalus. 3. Unchanged appearance of nonhemorrhagic small cortical infarcts along the right cerebral convexity. Electronically Signed   By: Monte Fantasia M.D.   On: 11/07/2018 09:42   Ct Angio Neck W And/or Wo Contrast  Result Date: 11/27/2018 CLINICAL DATA:  Facial weakness, left arm weakness, subacute cerebellar infarct EXAM: CT ANGIOGRAPHY HEAD AND NECK TECHNIQUE: Multidetector CT imaging of the  head and neck was performed using the standard protocol during bolus administration of intravenous contrast. Multiplanar CT image reconstructions and MIPs were obtained to evaluate the vascular anatomy. Carotid stenosis measurements (when applicable) are obtained utilizing NASCET criteria, using the distal internal carotid diameter as the denominator. CONTRAST:  70m OMNIPAQUE IOHEXOL 350 MG/ML SOLN COMPARISON:  None. FINDINGS: Motion artifact is present. CTA NECK FINDINGS Aortic arch: Aortic arch is minimally imaged. Great vessel origins are patent. Right carotid system: Common, internal, and external carotid arteries are patent. Mild calcified plaque is present at the proximal ICA without measurable stenosis. Left carotid system: Common, internal, and external carotid arteries are patent. Small focus of plaque ulceration at the left ICA origin. There is no measurable stenosis. Vertebral arteries: Dominant right vertebral artery is patent with mild calcified plaque near the origin. Left vertebral artery is patent with calcified plaque causing apparent high-grade stenosis but no evidence of flow limitation. Skeleton: Degenerative changes of the cervical spine. Other neck: Mildly enlarged and heterogeneous thyroid. No neck mass or adenopathy within limitation of artifact. Upper chest: Severe emphysema. Review of the MIP images confirms the above findings CTA HEAD FINDINGS Anterior circulation: Intracranial internal carotid arteries are patent with atherosclerotic irregularity and mild calcified plaque. Anterior and middle cerebral arteries are patent. Posterior circulation: Intracranial vertebral arteries are patent. There is moderate stenosis of the left vertebral artery. Basilar artery is patent. Posterior cerebral arteries are patent. Venous sinuses: As permitted  by contrast timing, patent. Anatomic variants: Diminutive bilateral posterior communicating arteries. Review of the MIP images confirms the above  findings IMPRESSION: No large vessel occlusion. No significant stenosis at the ICA origins. Plaque at the left vertebral artery origin causing severe stenosis but without evidence of flow limitation. Moderate stenosis of the intracranial left vertebral artery. Electronically Signed   By: Macy Mis M.D.   On: 11/27/2018 15:43   Mr Brain Wo Contrast  Result Date: 11/27/2018 CLINICAL DATA:  Muscle weakness (generalized). Additional history provided: Facial weakness, left arm weakness, subacute cerebellar infarct. EXAM: MRI HEAD WITHOUT CONTRAST TECHNIQUE: Multiplanar, multiecho pulse sequences of the brain and surrounding structures were obtained without intravenous contrast. COMPARISON:  CT angiogram head/neck 11/27/2018, noncontrast head CT 11/27/2018, head CT 11/07/2018, MRI/MRA head 11/05/2018 FINDINGS: Brain: There is an acute infarct centered within the right thalamocapsular junction involving the adjacent white matter tracks superiorly and inferiorly. This measures 1.5 x 1.0 x 1.8 cm (AP x TV x CC). No evidence of acute infarct elsewhere within the brain. There has been expected evolution of a large hemorrhagic subacute right cerebellar infarct. There has also been expected evolution of a moderate-sized nonhemorrhagic subacute left cerebellar infarct. Superimposed bilateral chronic cerebellar lacunar infarcts. Redemonstrated are multiple small hemorrhagic and nonhemorrhagic cortical infarcts within the bilateral cerebral hemispheres. Redemonstrated chronic lacunar infarct within the right thalamus. Background of otherwise mild chronic small vessel ischemic disease Vascular: Flow voids maintained within the proximal large arterial vessels. Skull and upper cervical spine: No focal marrow lesion. Sinuses/Orbits: Visualized orbits demonstrate no acute abnormality. Mild ethmoid sinus mucosal thickening. No significant mastoid effusion. These results were called by telephone at the time of interpretation on  11/27/2018 at 5:21 pm to provider PA Quentin Cornwall, who verbally acknowledged these results. IMPRESSION: 1.5 x 1.0 x 1.8 cm acute infarct centered within the right thalamocapsular junction involving the adjacent white matter tracks superiorly and inferiorly. Expected evolution of subacute large hemorrhagic right cerebellar and moderate-sized nonhemorrhagic left cerebellar infarcts. Multiple redemonstrated chronic infarcts within the cerebral cortex, right thalamus and bilateral cerebellar hemispheres. Background of mild chronic small vessel ischemic disease. Electronically Signed   By: Kellie Simmering DO   On: 11/27/2018 17:22   Dg Chest Port 1 View  Result Date: 12/03/2018 CLINICAL DATA:  Shortness of breath EXAM: PORTABLE CHEST 1 VIEW COMPARISON:  11/27/2018 FINDINGS: Heart is borderline in size. No confluent airspace opacities, effusions or edema. No acute bony abnormality. Aortic atherosclerosis. IMPRESSION: Borderline heart size.  No active disease. Electronically Signed   By: Rolm Baptise M.D.   On: 12/03/2018 21:13   Dg Chest Portable 1 View  Result Date: 11/27/2018 CLINICAL DATA:  Upper back pain. Fatigue. EXAM: PORTABLE CHEST 1 VIEW COMPARISON:  Radiograph 10/30/2018. CT 10/29/2018 FINDINGS: Loop recorder over the left chest wall.The cardiomediastinal contours are unchanged. Borderline cardiomegaly with aortic atherosclerosis. Mild chronic interstitial coarsening, emphysema on prior CT. Trace fluid in the right minor fissure with probable small right pleural effusion. No pulmonary edema. No consolidation or pneumothorax. No acute osseous abnormalities are seen. IMPRESSION: 1. Probable small right pleural effusion with fluid minor fissure. 2. Chronic interstitial coarsening, emphysema on prior CT. 3. Stable borderline cardiomegaly with aortic atherosclerosis. Aortic Atherosclerosis (ICD10-I70.0). Electronically Signed   By: Keith Rake M.D.   On: 11/27/2018 14:38   Dg Swallowing Func-speech  Pathology  Result Date: 11/29/2018 Objective Swallowing Evaluation: Type of Study: MBS-Modified Barium Swallow Study  Patient Details Name: VANETTA RULE MRN: [id number] Date of Birth:  1945/06/17 Today's Date: 11/29/2018 Time: SLP Start Time (ACUTE ONLY): 1110 -SLP Stop Time (ACUTE ONLY): 1132 SLP Time Calculation (min) (ACUTE ONLY): 22 min Past Medical History: Past Medical History: Diagnosis Date . Altered mental status  . Anxiety and depression  . Chronic respiratory failure with hypoxia ([place]) 10/02/2017  Assoc with ? Cor pulmonale dx 09/2017 so rx 02 1-2 lpm 24/7  - 10/17/2017  Walked RA x 3 laps @ 185 ft each stopped due to  End of study, nl pace,  desat to 87 on 3rd lap - 12/19/2017  Saturations on Room Air at Rest =91 % and while Ambulating = 87%  But  on  2 Liters of pulsed oxygen while Ambulating =93% so rec POC 2lpm walking / use at rest if sats under 90%     . Colon cancer ([place]) 1988  Resected . COPD (chronic obstructive pulmonary disease) ([place])  . Diabetes mellitus without complication ([name])   diet controlled- no meds. per pt . Diarrhea 04/02/2018 . Diverticulosis  . Hyperlipidemia  . Hypertension  . Hypoxia 10/30/2018 . Insomnia  . Primary hyperparathyroidism ([place])  . SBO (small bowel obstruction) ([place])  . Stroke ([place]) 10/2018  tpa administered . Tubular adenoma of rectum 02/23/2013  low grade . Vitamin D deficiency  Past Surgical History: Past Surgical History: Procedure Laterality Date . ABDOMINAL HYSTERECTOMY  08/2015 . BREAST BIOPSY Left  . BREAST EXCISIONAL BIOPSY Right  . COLON RESECTION  1980's . COLONOSCOPY   . ENDOMETRIAL BIOPSY  2009  negative . INCONTINENCE SURGERY  2017 . LOOP RECORDER INSERTION N/A 11/08/2018  Procedure: LOOP RECORDER INSERTION;  Surgeon: Constance Haw, MD;  Location: [facility];  Service: Cardiovascular;  Laterality: N/A; . RIGHT HEART CATH N/A 09/20/2017  Procedure: RIGHT HEART CATH;  Surgeon: Larey Dresser, MD;  Location: [facility];   Service: Cardiovascular;  Laterality: N/A; . RIGHT/LEFT HEART CATH AND CORONARY ANGIOGRAPHY N/A 03/19/2018  Procedure: RIGHT/LEFT HEART CATH AND CORONARY ANGIOGRAPHY;  Surgeon: Larey Dresser, MD;  Location: [facility];  Service: Cardiovascular;  Laterality: N/A; HPI: 73 y.o. female with past medical history significant for type 2 diabetes mellitus, hypertension, COPD, recent stroke  post TPA (11/2) with right cerebellar hemorrhagic transformation with new right thalamic capsular infarction.  CT head shows no significant changes, redemonstrates left vertebral artery severe stenosis.  Subjective: very sleepy Assessment / Plan / Recommendation CHL IP CLINICAL IMPRESSIONS 11/29/2018 Clinical Impression Difficult exam due to pt's lethargy and inability to fully participate.  By the end of the study, she was able to arouse to consume sips of thin and nectar-liquids, but given her MS, the assessment was limited.  She demonstrated decreased bolus organization, premature spillage of thin liquids with trace aspiration before laryngeal-vestibular closure. Nectars were noted to penetrate the larynx, but were not aspirated.  There was minimal residue post-swallow.  Aspiration was accompanied by a mild cough.  Pt was not alert enough to try strategies/postures that may have afforded her improved airway protection.  For now, recommend dysphagia 1 diet, nectar-thick liquids; crush meds in puree.  SLP will follow for diet advancement and further investigation into swallow physiology as MS improves.  SLP Visit Diagnosis Dysphagia, oropharyngeal phase (R13.12) Attention and concentration deficit following -- Frontal lobe and executive function deficit following -- Impact on safety and function Moderate aspiration risk;Risk for inadequate nutrition/hydration   CHL IP TREATMENT RECOMMENDATION 11/29/2018 Treatment Recommendations Therapy as outlined in treatment plan below   Prognosis  11/29/2018 Prognosis for Safe Diet  Advancement Fair Barriers to Reach Goals -- Barriers/Prognosis Comment -- CHL IP DIET RECOMMENDATION 11/29/2018 SLP Diet Recommendations Dysphagia 1 (Puree) solids;Nectar thick liquid Liquid Administration via Cup Medication Administration Crushed with puree Compensations Slow rate;Small sips/bites Postural Changes --   CHL IP OTHER RECOMMENDATIONS 11/29/2018 Recommended Consults -- Oral Care Recommendations Oral care BID Other Recommendations Order thickener from pharmacy   CHL IP FOLLOW UP RECOMMENDATIONS 11/05/2018 Follow up Recommendations (No Data)   CHL IP FREQUENCY AND DURATION 11/29/2018 Speech Therapy Frequency (ACUTE ONLY) min 2x/week Treatment Duration 2 weeks      CHL IP ORAL PHASE 11/29/2018 Oral Phase Impaired Oral - Pudding Teaspoon -- Oral - Pudding Cup -- Oral - Honey Teaspoon -- Oral - Honey Cup -- Oral - Nectar Teaspoon -- Oral - Nectar Cup Left anterior bolus loss;Right anterior bolus loss;Weak lingual manipulation;Reduced posterior propulsion;Holding of bolus;Delayed oral transit;Decreased bolus cohesion Oral - Nectar Straw -- Oral - Thin Teaspoon -- Oral - Thin Cup Left anterior bolus loss;Right anterior bolus loss;Reduced posterior propulsion;Holding of bolus;Pocketing in anterior sulcus;Delayed oral transit;Decreased bolus cohesion Oral - Thin Straw -- Oral - Puree Left anterior bolus loss;Right anterior bolus loss;Weak lingual manipulation;Reduced posterior propulsion;Holding of bolus;Delayed oral transit;Decreased bolus cohesion Oral - Mech Soft -- Oral - Regular -- Oral - Multi-Consistency -- Oral - Pill -- Oral Phase - Comment --  CHL IP PHARYNGEAL PHASE 11/29/2018 Pharyngeal Phase Impaired Pharyngeal- Pudding Teaspoon -- Pharyngeal -- Pharyngeal- Pudding Cup -- Pharyngeal -- Pharyngeal- Honey Teaspoon -- Pharyngeal -- Pharyngeal- Honey Cup -- Pharyngeal -- Pharyngeal- Nectar Teaspoon -- Pharyngeal -- Pharyngeal- Nectar Cup Delayed swallow initiation-pyriform sinuses;Reduced pharyngeal  peristalsis;Reduced tongue base retraction;Penetration/Aspiration before swallow;Pharyngeal residue - valleculae Pharyngeal Material enters airway, remains ABOVE vocal cords and not ejected out Pharyngeal- Nectar Straw -- Pharyngeal -- Pharyngeal- Thin Teaspoon -- Pharyngeal -- Pharyngeal- Thin Cup Delayed swallow initiation-pyriform sinuses;Reduced airway/laryngeal closure;Penetration/Aspiration before swallow;Trace aspiration Pharyngeal Material enters airway, passes BELOW cords and not ejected out despite cough attempt by patient Pharyngeal- Thin Straw -- Pharyngeal -- Pharyngeal- Puree Delayed swallow initiation-vallecula;Reduced pharyngeal peristalsis;Pharyngeal residue - valleculae Pharyngeal -- Pharyngeal- Mechanical Soft -- Pharyngeal -- Pharyngeal- Regular -- Pharyngeal -- Pharyngeal- Multi-consistency -- Pharyngeal -- Pharyngeal- Pill -- Pharyngeal -- Pharyngeal Comment --  No flowsheet data found. Juan Quam Laurice 11/29/2018, 1:35 PM              Vas [place] Lower Extremity Venous (dvt)  Result Date: 11/06/2018  Lower Venous Study Indications: Stroke.  Comparison Study: no prior Performing Technologist: Abram Sander RVS  Examination Guidelines: A complete evaluation includes B-mode imaging, spectral Doppler, color Doppler, and power Doppler as needed of all accessible portions of each vessel. Bilateral testing is considered an integral part of a complete examination. Limited examinations for reoccurring indications may be performed as noted.  +---------+---------------+---------+-----------+----------+--------------+ RIGHT    CompressibilityPhasicitySpontaneityPropertiesThrombus Aging +---------+---------------+---------+-----------+----------+--------------+ CFV      Full           Yes      Yes                                 +---------+---------------+---------+-----------+----------+--------------+ SFJ      Full                                                         +---------+---------------+---------+-----------+----------+--------------+  FV Prox  Full                                                        +---------+---------------+---------+-----------+----------+--------------+ FV Mid   Full                                                        +---------+---------------+---------+-----------+----------+--------------+ FV DistalFull                                                        +---------+---------------+---------+-----------+----------+--------------+ PFV      Full                                                        +---------+---------------+---------+-----------+----------+--------------+ POP      Full           Yes      Yes                                 +---------+---------------+---------+-----------+----------+--------------+ PTV      Full                                                        +---------+---------------+---------+-----------+----------+--------------+ PERO     Full                                                        +---------+---------------+---------+-----------+----------+--------------+   +---------+---------------+---------+-----------+----------+--------------+ LEFT     CompressibilityPhasicitySpontaneityPropertiesThrombus Aging +---------+---------------+---------+-----------+----------+--------------+ CFV      Full           Yes      Yes                                 +---------+---------------+---------+-----------+----------+--------------+ SFJ      Full                                                        +---------+---------------+---------+-----------+----------+--------------+ FV Prox  Full                                                        +---------+---------------+---------+-----------+----------+--------------+  FV Mid   Full                                                         +---------+---------------+---------+-----------+----------+--------------+ FV DistalFull                                                        +---------+---------------+---------+-----------+----------+--------------+ PFV      Full                                                        +---------+---------------+---------+-----------+----------+--------------+ POP      Full           Yes      Yes                                 +---------+---------------+---------+-----------+----------+--------------+ PTV      Full                                                        +---------+---------------+---------+-----------+----------+--------------+ PERO     Full                                                        +---------+---------------+---------+-----------+----------+--------------+     Summary: Right: There is no evidence of deep vein thrombosis in the lower extremity. No cystic structure found in the popliteal fossa. Left: There is no evidence of deep vein thrombosis in the lower extremity. No cystic structure found in the popliteal fossa.  *See table(s) above for measurements and observations. Electronically signed by Curt Jews MD on 11/06/2018 at 11:27:42 PM.    Final        Subjective: Patient seen and examined at bedside.  Poor historian.  No overnight fever, nausea, vomiting or worsening shortness of breath reported.  Discharge Exam: Vitals:   12/06/18 0353 12/06/18 0755  BP: (!) 131/91 (!) (P) 153/100  Pulse: (!) 58 (!) (P) 50  Resp: 17 (P) 18  Temp: 97.6 F (36.4 C) (P) 98.5 F (36.9 C)  SpO2: 100%     General: Pt is awake.  No distress.  Looks chronically ill. Cardiovascular: Slightly bradycardic, S1/S2 + Respiratory: bilateral decreased breath sounds at bases Abdominal: Soft, NT, ND, bowel sounds + Extremities: no edema, no cyanosis    The results of significant diagnostics from this hospitalization (including imaging, microbiology,  ancillary and laboratory) are listed below for reference.     Microbiology: Recent Results (from the past 240 hour(s))  SARS CORONAVIRUS 2 (TAT 6-24 HRS) Nasopharyngeal Nasopharyngeal Swab     Status: None   Collection  Time: 11/27/18  2:47 PM   Specimen: Nasopharyngeal Swab  Result Value Ref Range Status   SARS Coronavirus 2 NEGATIVE NEGATIVE Final    Comment: (NOTE) SARS-CoV-2 target nucleic acids are NOT DETECTED. The SARS-CoV-2 RNA is generally detectable in upper and lower respiratory specimens during the acute phase of infection. Negative results do not preclude SARS-CoV-2 infection, do not rule out co-infections with other pathogens, and should not be used as the sole basis for treatment or other patient management decisions. Negative results must be combined with clinical observations, patient history, and epidemiological information. The expected result is Negative. Fact Sheet for Patients: SugarRoll.be Fact Sheet for Healthcare Providers: https://www.woods-mathews.com/ This test is not yet approved or cleared by the [place] FDA and  has been authorized for detection and/or diagnosis of SARS-CoV-2 by FDA under an Emergency Use Authorization (EUA). This EUA will remain  in effect (meaning this test can be used) for the duration of the COVID-19 declaration under Section 56 4(b)(1) of the Act, 21 U.S.C. section 360bbb-3(b)(1), unless the authorization is terminated or revoked sooner. Performed at [facility], [place] [address] [place], [place] [address]   Culture, Urine     Status: Abnormal (Preliminary result)   Collection Time: 12/01/18  4:36 PM   Specimen: Urine, Random  Result Value Ref Range Status   Specimen Description URINE, RANDOM  Final   Special Requests NONE  Final   Culture (A)  Final    >=100,000 COLONIES/mL ESCHERICHIA COLI CULTURE REINCUBATED FOR BETTER GROWTH Performed at [facility], [place] [address], [place], [place] [address]    Report Status PENDING  Incomplete   Organism ID, Bacteria ESCHERICHIA COLI (A)  Final      Susceptibility   Escherichia coli - MIC*    AMPICILLIN >=32 RESISTANT Resistant     CEFAZOLIN <=4 SENSITIVE Sensitive     CEFTRIAXONE <=1 SENSITIVE Sensitive     CIPROFLOXACIN >=4 RESISTANT Resistant     GENTAMICIN >=16 RESISTANT Resistant     IMIPENEM 0.5 SENSITIVE Sensitive     NITROFURANTOIN <=16 SENSITIVE Sensitive     TRIMETH/SULFA >=320 RESISTANT Resistant     AMPICILLIN/SULBACTAM >=32 RESISTANT Resistant     PIP/TAZO 8 SENSITIVE Sensitive     Extended ESBL NEGATIVE Sensitive     * >=100,000 COLONIES/mL ESCHERICHIA COLI  SARS CORONAVIRUS 2 (TAT 6-24 HRS) Nasopharyngeal Nasopharyngeal Swab     Status: None   Collection Time: 12/03/18  2:58 PM   Specimen: Nasopharyngeal Swab  Result Value Ref Range Status   SARS Coronavirus 2 NEGATIVE NEGATIVE Final    Comment: (NOTE) SARS-CoV-2 target nucleic acids are NOT DETECTED. The SARS-CoV-2 RNA is generally detectable in upper and lower respiratory specimens during the acute phase of infection. Negative results do not preclude SARS-CoV-2 infection, do not rule out co-infections with other pathogens, and should not be used as the sole basis for treatment or other patient management decisions. Negative results must be combined with clinical observations, patient history, and epidemiological information. The expected result is Negative. Fact Sheet for Patients: SugarRoll.be Fact Sheet for Healthcare Providers: https://www.woods-mathews.com/ This test is not yet approved or cleared by the [place] FDA and  has been authorized for detection and/or diagnosis of SARS-CoV-2 by FDA under an Emergency Use Authorization (EUA). This EUA will remain  in effect (meaning this test can be used) for the duration of the COVID-19 declaration under Section 56 4(b)(1) of the Act,  21 U.S.C. section 360bbb-3(b)(1), unless  the authorization is terminated or revoked sooner. Performed at [facility], [place] [address], [place], [place] [address]      Labs: BNP (last 3 results) Recent Labs    03/15/18 2008 10/28/18 2044  BNP 1,914.0* 2,952.8*   Basic Metabolic Panel: Recent Labs  Lab 12/01/18 0758 12/02/18 0256 12/03/18 0116 12/04/18 1030 12/06/18 0640  NA 147* 149* 145 143 144  K 3.8 3.4* 3.9 3.9 4.8  CL 108 109 109 106 109  CO2 _0 GLUCOSE 153* 71 113* 117* 122*  BUN 29* 33* 31* 21 28*  CREATININE 0.97 0.87 0.76 0.86 0.82  CALCIUM 11.1* 10.5*  11.0* 10.6* 10.9* 11.2*  MG  --  1.9 1.8 2.2  --   PHOS  --  3.1 2.4* 3.1  --    Liver Function Tests: Recent Labs  Lab 12/01/18 0758 12/02/18 0256 12/03/18 0116 12/04/18 1030 12/06/18 0640  AST 29  --   --   --  29  ALT 16  --   --   --  18  ALKPHOS 84  --   --   --  88  BILITOT 0.9  --   --   --  0.6  PROT 6.1*  --   --   --  6.2*  ALBUMIN 3.3* 3.2* 3.1* 3.3* 3.6   No results for input(s): LIPASE, AMYLASE in the last 168 hours. No results for input(s): AMMONIA in the last 168 hours. CBC: Recent Labs  Lab 12/01/18 0758 12/02/18 0256 12/03/18 0116 12/04/18 1030 12/06/18 0640  WBC 5.0 3.7* 4.1 4.2 4.4  NEUTROABS  --  2.3 2.5 2.8  --   HGB 12.5 11.4* 11.2* 12.1 13.7  HCT 40.9 36.7 35.5* 38.8 43.5  MCV 85.0 84.8 83.7 84.5 84.6  PLT 178 192 182 228 248   Cardiac Enzymes: No results for input(s): CKTOTAL, CKMB, CKMBINDEX, TROPONINI in the last 168 hours. BNP: Invalid input(s): POCBNP CBG: Recent Labs  Lab 12/05/18 0809 12/05/18 1200 12/05/18 1652 12/05/18 2204 12/06/18 0608  GLUCAP 117* 147* 199* 174* 110*   D-Dimer No results for input(s): DDIMER in the last 72 hours. Hgb A1c No results for input(s): HGBA1C in the last 72 hours. Lipid Profile No results for input(s): CHOL, HDL, LDLCALC, TRIG, CHOLHDL, LDLDIRECT in the last 72 hours. Thyroid function  studies No results for input(s): TSH, T4TOTAL, T3FREE, THYROIDAB in the last 72 hours.  Invalid input(s): FREET3 Anemia work up No results for input(s): VITAMINB12, FOLATE, FERRITIN, TIBC, IRON, RETICCTPCT in the last 72 hours. Urinalysis    Component Value Date/Time   COLORURINE YELLOW 11/27/2018 [place] 11/27/2018 1534   LABSPEC 1.019 11/27/2018 1534   PHURINE 7.0 11/27/2018 1534   GLUCOSEU NEGATIVE 11/27/2018 1534   HGBUR NEGATIVE 11/27/2018 [place] 11/27/2018 1534   BILIRUBINUR Negative 09/28/2017 [place] 11/27/2018 1534   PROTEINUR 30 (A) 11/27/2018 1534   UROBILINOGEN 0.2 09/28/2017 1604   NITRITE NEGATIVE 11/27/2018 1534   LEUKOCYTESUR NEGATIVE 11/27/2018 1534   Sepsis Labs Invalid input(s): PROCALCITONIN,  WBC,  LACTICIDVEN Microbiology Recent Results (from the past 240 hour(s))  SARS CORONAVIRUS 2 (TAT 6-24 HRS) Nasopharyngeal Nasopharyngeal Swab     Status: None   Collection Time: 11/27/18  2:47 PM   Specimen: Nasopharyngeal Swab  Result Value Ref Range Status   SARS Coronavirus 2 NEGATIVE NEGATIVE Final    Comment: (NOTE) SARS-CoV-2 target nucleic acids are NOT  DETECTED. The SARS-CoV-2 RNA is generally detectable in upper and lower respiratory specimens during the acute phase of infection. Negative results do not preclude SARS-CoV-2 infection, do not rule out co-infections with other pathogens, and should not be used as the sole basis for treatment or other patient management decisions. Negative results must be combined with clinical observations, patient history, and epidemiological information. The expected result is Negative. Fact Sheet for Patients: SugarRoll.be Fact Sheet for Healthcare Providers: https://www.woods-mathews.com/ This test is not yet approved or cleared by the [place] FDA and  has been authorized for detection and/or diagnosis of  SARS-CoV-2 by FDA under an Emergency Use Authorization (EUA). This EUA will remain  in effect (meaning this test can be used) for the duration of the COVID-19 declaration under Section 56 4(b)(1) of the Act, 21 U.S.C. section 360bbb-3(b)(1), unless the authorization is terminated or revoked sooner. Performed at [facility], [place] [address], [place], [place] [address]   Culture, Urine     Status: Abnormal (Preliminary result)   Collection Time: 12/01/18  4:36 PM   Specimen: Urine, Random  Result Value Ref Range Status   Specimen Description URINE, RANDOM  Final   Special Requests NONE  Final   Culture (A)  Final    >=100,000 COLONIES/mL ESCHERICHIA COLI CULTURE REINCUBATED FOR BETTER GROWTH Performed at [facility], [place] [address] [place], [place] [address]    Report Status PENDING  Incomplete   Organism ID, Bacteria ESCHERICHIA COLI (A)  Final      Susceptibility   Escherichia coli - MIC*    AMPICILLIN >=32 RESISTANT Resistant     CEFAZOLIN <=4 SENSITIVE Sensitive     CEFTRIAXONE <=1 SENSITIVE Sensitive     CIPROFLOXACIN >=4 RESISTANT Resistant     GENTAMICIN >=16 RESISTANT Resistant     IMIPENEM 0.5 SENSITIVE Sensitive     NITROFURANTOIN <=16 SENSITIVE Sensitive     TRIMETH/SULFA >=320 RESISTANT Resistant     AMPICILLIN/SULBACTAM >=32 RESISTANT Resistant     PIP/TAZO 8 SENSITIVE Sensitive     Extended ESBL NEGATIVE Sensitive     * >=100,000 COLONIES/mL ESCHERICHIA COLI  SARS CORONAVIRUS 2 (TAT 6-24 HRS) Nasopharyngeal Nasopharyngeal Swab     Status: None   Collection Time: 12/03/18  2:58 PM   Specimen: Nasopharyngeal Swab  Result Value Ref Range Status   SARS Coronavirus 2 NEGATIVE NEGATIVE Final    Comment: (NOTE) SARS-CoV-2 target nucleic acids are NOT DETECTED. The SARS-CoV-2 RNA is generally detectable in upper and lower respiratory specimens during the acute phase of infection. Negative results do not preclude SARS-CoV-2 infection, do not rule  out co-infections with other pathogens, and should not be used as the sole basis for treatment or other patient management decisions. Negative results must be combined with clinical observations, patient history, and epidemiological information. The expected result is Negative. Fact Sheet for Patients: SugarRoll.be Fact Sheet for Healthcare Providers: https://www.woods-mathews.com/ This test is not yet approved or cleared by the [place] FDA and  has been authorized for detection and/or diagnosis of SARS-CoV-2 by FDA under an Emergency Use Authorization (EUA). This EUA will remain  in effect (meaning this test can be used) for the duration of the COVID-19 declaration under Section 56 4(b)(1) of the Act, 21 U.S.C. section 360bbb-3(b)(1), unless the authorization is terminated or revoked sooner. Performed at [facility], [place] [address], [place], [place] [address]      Time coordinating discharge: 35 minutes  SIGNED:     Starla Link, MD  [facility] 12/06/2018, 8:43 AM

## 2018-12-06 NOTE — Progress Notes (Addendum)
I assumed care of the patient at 0730 with bedside reporting completed. Pt is resting in bed in no apparent distress. PCT Mimi at bedside assisted in repositioning pt in bed and taking pt's VS. BP was elevated. Pt given BP medications earlier than scheduled and will re-evaluated BP in 75mn-1 hour. Pt has no complaints at this time. Bed in lowest and locked position. Call light within reach. Bedside table setup with food for patient to feed herself and HOB at 30 degrees.   Update: BP checked at 0941 and WNL. Pt asymptomatic and in no apparent distress, resting comfortably in bed at this time.

## 2018-12-06 NOTE — Progress Notes (Signed)
   12/06/18 0535  Provider Notification  Provider Name/Title Jeannette Corpus NP  Date Provider Notified 12/06/18  Time Provider Notified [phone]  Notification Type Page  Notification Reason Other (Comment) (Pt having frequent PVCs and Vtach)  Response See new orders  Date of Provider Response 12/06/18  Time of Provider Response [phone]

## 2018-12-06 NOTE — TOC Transition Note (Signed)
Transition of Care [facility]) - CM/SW Discharge Note   Patient Details  Name: Denise [name] MRN: [id number] Date of Birth: [birth year]  [facility] CM/SW Contact:  Pollie Friar, RN Phone Number: 12/06/2018, 10:13 AM   Clinical Narrative:    Pt discharging to [facility] today. CM has reached out to spouse and awaiting call back.  Pt to transport via [facility]. D/c packet at the desk and bedside RN updated.   Room: 101B Number for report: [phone]   Final next level of care: Skilled Nursing Facility Barriers to Discharge: No Barriers Identified   Patient Goals and CMS Choice Patient states their goals for this hospitalization and ongoing recovery are:: to get better and go home CMS Medicare.gov Compare Post Acute Care list provided to:: Patient Choice offered to / list presented to : Spouse, Patient  Discharge Placement                       Discharge Plan and Services                                     Social Determinants of Health (SDOH) Interventions     Readmission Risk Interventions No flowsheet data found.

## 2018-12-07 LAB — URINE CULTURE: Culture: >=100000 — AB

## 2018-12-12 ENCOUNTER — Other Ambulatory Visit: Payer: Self-pay | Admitting: Physical Medicine and Rehabilitation

## 2018-12-12 DIAGNOSIS — I10 Essential (primary) hypertension: Secondary | ICD-10-CM

## 2018-12-12 DIAGNOSIS — J449 Chronic obstructive pulmonary disease, unspecified: Secondary | ICD-10-CM | POA: Diagnosis not present

## 2018-12-12 DIAGNOSIS — E119 Type 2 diabetes mellitus without complications: Secondary | ICD-10-CM | POA: Diagnosis not present

## 2018-12-12 DIAGNOSIS — J9621 Acute and chronic respiratory failure with hypoxia: Secondary | ICD-10-CM | POA: Diagnosis not present

## 2018-12-12 DIAGNOSIS — E785 Hyperlipidemia, unspecified: Secondary | ICD-10-CM

## 2018-12-12 DIAGNOSIS — I63211 Cerebral infarction due to unspecified occlusion or stenosis of right vertebral arteries: Secondary | ICD-10-CM | POA: Diagnosis not present

## 2018-12-13 DIAGNOSIS — J449 Chronic obstructive pulmonary disease, unspecified: Secondary | ICD-10-CM | POA: Diagnosis not present

## 2018-12-13 DIAGNOSIS — I69391 Dysphagia following cerebral infarction: Secondary | ICD-10-CM | POA: Diagnosis not present

## 2018-12-13 DIAGNOSIS — J9611 Chronic respiratory failure with hypoxia: Secondary | ICD-10-CM | POA: Diagnosis not present

## 2018-12-13 DIAGNOSIS — E1165 Type 2 diabetes mellitus with hyperglycemia: Secondary | ICD-10-CM | POA: Diagnosis not present

## 2018-12-13 DIAGNOSIS — F411 Generalized anxiety disorder: Secondary | ICD-10-CM | POA: Diagnosis not present

## 2018-12-13 DIAGNOSIS — I6930 Unspecified sequelae of cerebral infarction: Secondary | ICD-10-CM | POA: Diagnosis not present

## 2018-12-16 ENCOUNTER — Other Ambulatory Visit: Payer: Self-pay

## 2018-12-17 DIAGNOSIS — E1165 Type 2 diabetes mellitus with hyperglycemia: Secondary | ICD-10-CM | POA: Diagnosis not present

## 2018-12-17 DIAGNOSIS — J449 Chronic obstructive pulmonary disease, unspecified: Secondary | ICD-10-CM | POA: Diagnosis not present

## 2018-12-17 DIAGNOSIS — E1151 Type 2 diabetes mellitus with diabetic peripheral angiopathy without gangrene: Secondary | ICD-10-CM | POA: Diagnosis not present

## 2018-12-17 DIAGNOSIS — I6930 Unspecified sequelae of cerebral infarction: Secondary | ICD-10-CM | POA: Diagnosis not present

## 2018-12-17 DIAGNOSIS — F411 Generalized anxiety disorder: Secondary | ICD-10-CM | POA: Diagnosis not present

## 2018-12-17 DIAGNOSIS — I69391 Dysphagia following cerebral infarction: Secondary | ICD-10-CM | POA: Diagnosis not present

## 2018-12-17 DIAGNOSIS — J9611 Chronic respiratory failure with hypoxia: Secondary | ICD-10-CM | POA: Diagnosis not present

## 2018-12-18 ENCOUNTER — Telehealth (HOSPITAL_COMMUNITY): Payer: Self-pay

## 2018-12-18 DIAGNOSIS — J441 Chronic obstructive pulmonary disease with (acute) exacerbation: Secondary | ICD-10-CM | POA: Diagnosis not present

## 2018-12-18 DIAGNOSIS — R5381 Other malaise: Secondary | ICD-10-CM | POA: Diagnosis not present

## 2018-12-18 DIAGNOSIS — I6930 Unspecified sequelae of cerebral infarction: Secondary | ICD-10-CM | POA: Diagnosis not present

## 2018-12-18 DIAGNOSIS — J9611 Chronic respiratory failure with hypoxia: Secondary | ICD-10-CM | POA: Diagnosis not present

## 2018-12-18 NOTE — Telephone Encounter (Signed)
I called and spoke with her husband who said that she's at the rehab facility.  The family will have an meeting tomorrow with her doctor to talk about her future treatment plans.  I told him that I will f/u with the next week.

## 2018-12-19 ENCOUNTER — Encounter (HOSPITAL_COMMUNITY): Payer: Medicare HMO | Admitting: Cardiology

## 2018-12-23 ENCOUNTER — Ambulatory Visit: Payer: Medicare HMO | Admitting: Primary Care

## 2018-12-30 ENCOUNTER — Telehealth: Payer: Self-pay | Admitting: Primary Care

## 2018-12-30 ENCOUNTER — Observation Stay (HOSPITAL_COMMUNITY)
Admission: EM | Admit: 2018-12-30 | Discharge: 2018-12-31 | Disposition: A | Discharge status: Home / Self Care | Payer: Medicare HMO | Attending: Family Medicine | Admitting: Family Medicine

## 2018-12-30 ENCOUNTER — Emergency Department (HOSPITAL_COMMUNITY): Payer: Medicare HMO

## 2018-12-30 ENCOUNTER — Encounter (HOSPITAL_COMMUNITY): Payer: Self-pay | Admitting: *Deleted

## 2018-12-30 ENCOUNTER — Other Ambulatory Visit: Payer: Self-pay

## 2018-12-30 DIAGNOSIS — J441 Chronic obstructive pulmonary disease with (acute) exacerbation: Secondary | ICD-10-CM | POA: Diagnosis not present

## 2018-12-30 DIAGNOSIS — I619 Nontraumatic intracerebral hemorrhage, unspecified: Secondary | ICD-10-CM | POA: Diagnosis present

## 2018-12-30 DIAGNOSIS — E43 Unspecified severe protein-calorie malnutrition: Secondary | ICD-10-CM

## 2018-12-30 DIAGNOSIS — Z7901 Long term (current) use of anticoagulants: Secondary | ICD-10-CM | POA: Insufficient documentation

## 2018-12-30 DIAGNOSIS — E785 Hyperlipidemia, unspecified: Secondary | ICD-10-CM | POA: Diagnosis not present

## 2018-12-30 DIAGNOSIS — Z79899 Other long term (current) drug therapy: Secondary | ICD-10-CM | POA: Diagnosis not present

## 2018-12-30 DIAGNOSIS — I5023 Acute on chronic systolic (congestive) heart failure: Secondary | ICD-10-CM | POA: Diagnosis not present

## 2018-12-30 DIAGNOSIS — I4891 Unspecified atrial fibrillation: Secondary | ICD-10-CM | POA: Insufficient documentation

## 2018-12-30 DIAGNOSIS — E870 Hyperosmolality and hypernatremia: Secondary | ICD-10-CM | POA: Insufficient documentation

## 2018-12-30 DIAGNOSIS — F32A Depression, unspecified: Secondary | ICD-10-CM | POA: Diagnosis present

## 2018-12-30 DIAGNOSIS — Z9981 Dependence on supplemental oxygen: Secondary | ICD-10-CM | POA: Insufficient documentation

## 2018-12-30 DIAGNOSIS — K219 Gastro-esophageal reflux disease without esophagitis: Secondary | ICD-10-CM | POA: Diagnosis not present

## 2018-12-30 DIAGNOSIS — G4700 Insomnia, unspecified: Secondary | ICD-10-CM | POA: Insufficient documentation

## 2018-12-30 DIAGNOSIS — J9622 Acute and chronic respiratory failure with hypercapnia: Secondary | ICD-10-CM | POA: Insufficient documentation

## 2018-12-30 DIAGNOSIS — J9611 Chronic respiratory failure with hypoxia: Secondary | ICD-10-CM | POA: Diagnosis present

## 2018-12-30 DIAGNOSIS — E559 Vitamin D deficiency, unspecified: Secondary | ICD-10-CM | POA: Diagnosis present

## 2018-12-30 DIAGNOSIS — I110 Hypertensive heart disease with heart failure: Secondary | ICD-10-CM | POA: Insufficient documentation

## 2018-12-30 DIAGNOSIS — Z20828 Contact with and (suspected) exposure to other viral communicable diseases: Secondary | ICD-10-CM | POA: Diagnosis not present

## 2018-12-30 DIAGNOSIS — I509 Heart failure, unspecified: Secondary | ICD-10-CM

## 2018-12-30 DIAGNOSIS — F419 Anxiety disorder, unspecified: Secondary | ICD-10-CM | POA: Diagnosis not present

## 2018-12-30 DIAGNOSIS — J9621 Acute and chronic respiratory failure with hypoxia: Principal | ICD-10-CM

## 2018-12-30 DIAGNOSIS — I272 Pulmonary hypertension, unspecified: Secondary | ICD-10-CM | POA: Diagnosis present

## 2018-12-30 DIAGNOSIS — R262 Difficulty in walking, not elsewhere classified: Secondary | ICD-10-CM | POA: Insufficient documentation

## 2018-12-30 DIAGNOSIS — E119 Type 2 diabetes mellitus without complications: Secondary | ICD-10-CM

## 2018-12-30 DIAGNOSIS — F329 Major depressive disorder, single episode, unspecified: Secondary | ICD-10-CM | POA: Insufficient documentation

## 2018-12-30 DIAGNOSIS — Z8673 Personal history of transient ischemic attack (TIA), and cerebral infarction without residual deficits: Secondary | ICD-10-CM | POA: Diagnosis not present

## 2018-12-30 DIAGNOSIS — I160 Hypertensive urgency: Secondary | ICD-10-CM | POA: Insufficient documentation

## 2018-12-30 DIAGNOSIS — Z7951 Long term (current) use of inhaled steroids: Secondary | ICD-10-CM | POA: Insufficient documentation

## 2018-12-30 DIAGNOSIS — E210 Primary hyperparathyroidism: Secondary | ICD-10-CM | POA: Insufficient documentation

## 2018-12-30 DIAGNOSIS — Z85038 Personal history of other malignant neoplasm of large intestine: Secondary | ICD-10-CM | POA: Insufficient documentation

## 2018-12-30 DIAGNOSIS — J449 Chronic obstructive pulmonary disease, unspecified: Secondary | ICD-10-CM | POA: Diagnosis present

## 2018-12-30 DIAGNOSIS — Z87891 Personal history of nicotine dependence: Secondary | ICD-10-CM | POA: Insufficient documentation

## 2018-12-30 DIAGNOSIS — I5033 Acute on chronic diastolic (congestive) heart failure: Secondary | ICD-10-CM

## 2018-12-30 DIAGNOSIS — I10 Essential (primary) hypertension: Secondary | ICD-10-CM | POA: Diagnosis not present

## 2018-12-30 DIAGNOSIS — R0602 Shortness of breath: Secondary | ICD-10-CM | POA: Diagnosis not present

## 2018-12-30 DIAGNOSIS — I5032 Chronic diastolic (congestive) heart failure: Secondary | ICD-10-CM | POA: Insufficient documentation

## 2018-12-30 HISTORY — DX: Acute and chronic respiratory failure with hypoxia: J96.21

## 2018-12-30 HISTORY — DX: Acute on chronic diastolic (congestive) heart failure: I50.33

## 2018-12-30 LAB — URINALYSIS, ROUTINE W REFLEX MICROSCOPIC
Bilirubin Urine: NEGATIVE
Glucose, UA: NEGATIVE mg/dL
Hgb urine dipstick: NEGATIVE
Ketones, ur: NEGATIVE mg/dL
Nitrite: NEGATIVE
Protein, ur: 30 mg/dL — AB
Specific Gravity, Urine: 1.018 (ref 1.005–1.030)
pH: 5 (ref 5.0–8.0)

## 2018-12-30 LAB — CBC WITH DIFFERENTIAL/PLATELET
Abs Immature Granulocytes: 0.02 10*3/uL (ref 0.00–0.07)
Basophils Absolute: 0.1 10*3/uL (ref 0.0–0.1)
Basophils Relative: 1 %
Eosinophils Absolute: 0 10*3/uL (ref 0.0–0.5)
Eosinophils Relative: 1 %
HCT: 47.4 % — ABNORMAL HIGH (ref 36.0–46.0)
Hemoglobin: 14.7 g/dL (ref 12.0–15.0)
Immature Granulocytes: 0 %
Lymphocytes Relative: 13 %
Lymphs Abs: 0.8 10*3/uL (ref 0.7–4.0)
MCH: 26.6 pg (ref 26.0–34.0)
MCHC: 31 g/dL (ref 30.0–36.0)
MCV: 85.9 fL (ref 80.0–100.0)
Monocytes Absolute: 0.4 10*3/uL (ref 0.1–1.0)
Monocytes Relative: 7 %
Neutro Abs: 5.1 10*3/uL (ref 1.7–7.7)
Neutrophils Relative %: 78 %
Platelets: 217 10*3/uL (ref 150–400)
RBC: 5.52 MIL/uL — ABNORMAL HIGH (ref 3.87–5.11)
RDW: 16.4 % — ABNORMAL HIGH (ref 11.5–15.5)
WBC: 6.5 10*3/uL (ref 4.0–10.5)
nRBC: 0 % (ref 0.0–0.2)

## 2018-12-30 LAB — POCT I-STAT EG7
Acid-Base Excess: 10 mmol/L — ABNORMAL HIGH (ref 0.0–2.0)
Bicarbonate: 38.3 mmol/L — ABNORMAL HIGH (ref 20.0–28.0)
Calcium, Ion: 1.49 mmol/L — ABNORMAL HIGH (ref 1.15–1.40)
HCT: 42 % (ref 36.0–46.0)
Hemoglobin: 14.3 g/dL (ref 12.0–15.0)
O2 Saturation: 51 %
Potassium: 4.2 mmol/L (ref 3.5–5.1)
Sodium: 145 mmol/L (ref 135–145)
TCO2: 40 mmol/L — ABNORMAL HIGH (ref 22–32)
pCO2, Ven: 66.6 mmHg — ABNORMAL HIGH (ref 44.0–60.0)
pH, Ven: 7.368 (ref 7.250–7.430)
pO2, Ven: 29 mmHg — CL (ref 32.0–45.0)

## 2018-12-30 LAB — BASIC METABOLIC PANEL
Anion gap: 15 (ref 5–15)
BUN: 39 mg/dL — ABNORMAL HIGH (ref 8–23)
CO2: 30 mmol/L (ref 22–32)
Calcium: 11.9 mg/dL — ABNORMAL HIGH (ref 8.9–10.3)
Chloride: 100 mmol/L (ref 98–111)
Creatinine, Ser: 1.22 mg/dL — ABNORMAL HIGH (ref 0.44–1.00)
GFR calc Af Amer: 51 mL/min — ABNORMAL LOW (ref >60)
GFR calc non Af Amer: 44 mL/min — ABNORMAL LOW (ref >60)
Glucose, Bld: 223 mg/dL — ABNORMAL HIGH (ref 70–99)
Potassium: 4.6 mmol/L (ref 3.5–5.1)
Sodium: 145 mmol/L (ref 135–145)

## 2018-12-30 LAB — GLUCOSE, CAPILLARY
Glucose-Capillary: 211 mg/dL — ABNORMAL HIGH (ref 70–99)
Glucose-Capillary: 251 mg/dL — ABNORMAL HIGH (ref 70–99)

## 2018-12-30 LAB — RESPIRATORY PANEL BY RT PCR (FLU A&B, COVID)
Influenza A by PCR: NEGATIVE
Influenza B by PCR: NEGATIVE
SARS Coronavirus 2 by RT PCR: NEGATIVE

## 2018-12-30 LAB — PHOSPHORUS: Phosphorus: 3.8 mg/dL (ref 2.5–4.6)

## 2018-12-30 LAB — MAGNESIUM: Magnesium: 2.1 mg/dL (ref 1.7–2.4)

## 2018-12-30 LAB — POC SARS CORONAVIRUS 2 AG -  ED: SARS Coronavirus 2 Ag: NEGATIVE

## 2018-12-30 LAB — BRAIN NATRIURETIC PEPTIDE: B Natriuretic Peptide: 908.4 pg/mL — ABNORMAL HIGH (ref 0.0–100.0)

## 2018-12-30 MED ORDER — SODIUM CHLORIDE 0.9% FLUSH
3.0000 mL | Freq: Two times a day (BID) | INTRAVENOUS | Status: DC
  Administered 2018-12-30 – 2018-12-31 (×2): 3 mL via INTRAVENOUS

## 2018-12-30 MED ORDER — MIRTAZAPINE 15 MG PO TABS
15.0000 mg | ORAL_TABLET | Freq: Every day | ORAL | Status: DC
  Administered 2018-12-30: 15 mg via ORAL
  Filled 2018-12-30: qty 1

## 2018-12-30 MED ORDER — SORBITOL 70 % SOLN
30.0000 mL | Freq: Every day | Status: DC | PRN
  Filled 2018-12-30: qty 30

## 2018-12-30 MED ORDER — POTASSIUM CHLORIDE CRYS ER 10 MEQ PO TBCR
10.0000 meq | EXTENDED_RELEASE_TABLET | Freq: Every day | ORAL | Status: DC
  Administered 2018-12-31: 10 meq via ORAL
  Filled 2018-12-30 (×2): qty 1

## 2018-12-30 MED ORDER — APIXABAN 5 MG PO TABS
5.0000 mg | ORAL_TABLET | Freq: Two times a day (BID) | ORAL | Status: DC
  Administered 2018-12-30 – 2018-12-31 (×2): 5 mg via ORAL
  Filled 2018-12-30 (×2): qty 1

## 2018-12-30 MED ORDER — LORATADINE 10 MG PO TABS
10.0000 mg | ORAL_TABLET | Freq: Every day | ORAL | Status: DC
  Administered 2018-12-30 – 2018-12-31 (×2): 10 mg via ORAL
  Filled 2018-12-30 (×2): qty 1

## 2018-12-30 MED ORDER — SODIUM CHLORIDE 0.9% FLUSH: 3.0000 mL | INTRAVENOUS | Status: DC | PRN

## 2018-12-30 MED ORDER — LEVALBUTEROL HCL 0.63 MG/3ML IN NEBU
0.6300 mg | INHALATION_SOLUTION | Freq: Four times a day (QID) | RESPIRATORY_TRACT | Status: DC
  Administered 2018-12-30: 0.63 mg via RESPIRATORY_TRACT
  Filled 2018-12-30: qty 3

## 2018-12-30 MED ORDER — FUROSEMIDE 10 MG/ML IJ SOLN: 20.0000 mg | Freq: Two times a day (BID) | INTRAMUSCULAR | Status: DC

## 2018-12-30 MED ORDER — FOLIC ACID 1 MG PO TABS
1.0000 mg | ORAL_TABLET | Freq: Every day | ORAL | Status: DC
  Administered 2018-12-30 – 2018-12-31 (×2): 1 mg via ORAL
  Filled 2018-12-30 (×2): qty 1

## 2018-12-30 MED ORDER — INSULIN ASPART 100 UNIT/ML ~~LOC~~ SOLN
0.0000 [IU] | Freq: Three times a day (TID) | SUBCUTANEOUS | Status: DC
  Administered 2018-12-31: 3 [IU] via SUBCUTANEOUS

## 2018-12-30 MED ORDER — IPRATROPIUM BROMIDE 0.02 % IN SOLN
0.5000 mg | Freq: Four times a day (QID) | RESPIRATORY_TRACT | Status: DC
  Administered 2018-12-30: 0.5 mg via RESPIRATORY_TRACT
  Filled 2018-12-30: qty 2.5

## 2018-12-30 MED ORDER — THIAMINE HCL 100 MG PO TABS
100.0000 mg | ORAL_TABLET | Freq: Every day | ORAL | Status: DC
  Administered 2018-12-30 – 2018-12-31 (×2): 100 mg via ORAL
  Filled 2018-12-30 (×2): qty 1

## 2018-12-30 MED ORDER — ACETAMINOPHEN 325 MG PO TABS: 650.0000 mg | ORAL_TABLET | Freq: Four times a day (QID) | ORAL | Status: DC | PRN

## 2018-12-30 MED ORDER — METHYLPREDNISOLONE SODIUM SUCC 125 MG IJ SOLR
125.0000 mg | Freq: Once | INTRAMUSCULAR | Status: AC
  Administered 2018-12-30: 125 mg via INTRAVENOUS
  Filled 2018-12-30: qty 2

## 2018-12-30 MED ORDER — SODIUM CHLORIDE 0.9 % IV SOLN: 250.0000 mL | INTRAVENOUS | Status: DC | PRN

## 2018-12-30 MED ORDER — ADULT MULTIVITAMIN W/MINERALS CH
1.0000 | ORAL_TABLET | Freq: Every day | ORAL | Status: DC
  Administered 2018-12-30 – 2018-12-31 (×2): 1 via ORAL
  Filled 2018-12-30 (×2): qty 1

## 2018-12-30 MED ORDER — ONDANSETRON HCL 4 MG PO TABS: 4.0000 mg | ORAL_TABLET | Freq: Four times a day (QID) | ORAL | Status: DC | PRN

## 2018-12-30 MED ORDER — PANTOPRAZOLE SODIUM 40 MG PO TBEC
40.0000 mg | DELAYED_RELEASE_TABLET | Freq: Every day | ORAL | Status: DC
  Administered 2018-12-31: 40 mg via ORAL
  Filled 2018-12-30: qty 1

## 2018-12-30 MED ORDER — FLUTICASONE PROPIONATE 50 MCG/ACT NA SUSP
2.0000 | Freq: Every day | NASAL | Status: DC
  Administered 2018-12-31: 2 via NASAL
  Filled 2018-12-30: qty 16

## 2018-12-30 MED ORDER — METHYLPREDNISOLONE SODIUM SUCC 125 MG IJ SOLR
60.0000 mg | Freq: Four times a day (QID) | INTRAMUSCULAR | Status: DC
  Administered 2018-12-30 – 2018-12-31 (×4): 60 mg via INTRAVENOUS
  Filled 2018-12-30 (×4): qty 2

## 2018-12-30 MED ORDER — SODIUM CHLORIDE 0.9 % IV SOLN
100.0000 mg | Freq: Two times a day (BID) | INTRAVENOUS | Status: DC
  Administered 2018-12-30 – 2018-12-31 (×2): 100 mg via INTRAVENOUS
  Filled 2018-12-30 (×4): qty 100

## 2018-12-30 MED ORDER — FUROSEMIDE 10 MG/ML IJ SOLN
40.0000 mg | Freq: Once | INTRAMUSCULAR | Status: AC
  Administered 2018-12-30: 40 mg via INTRAVENOUS
  Filled 2018-12-30: qty 4

## 2018-12-30 MED ORDER — METOPROLOL TARTRATE 25 MG PO TABS
12.5000 mg | ORAL_TABLET | Freq: Two times a day (BID) | ORAL | Status: DC
  Administered 2018-12-30 – 2018-12-31 (×2): 12.5 mg via ORAL
  Filled 2018-12-30 (×2): qty 1

## 2018-12-30 MED ORDER — FLEET ENEMA 7-19 GM/118ML RE ENEM: 1.0000 | ENEMA | Freq: Once | RECTAL | Status: DC | PRN

## 2018-12-30 MED ORDER — SPIRONOLACTONE 25 MG PO TABS
25.0000 mg | ORAL_TABLET | Freq: Every day | ORAL | Status: DC
  Administered 2018-12-31: 25 mg via ORAL
  Filled 2018-12-30: qty 1

## 2018-12-30 MED ORDER — ONDANSETRON HCL 4 MG/2ML IJ SOLN: 4.0000 mg | Freq: Four times a day (QID) | INTRAMUSCULAR | Status: DC | PRN

## 2018-12-30 MED ORDER — ACETAMINOPHEN 650 MG RE SUPP: 650.0000 mg | Freq: Four times a day (QID) | RECTAL | Status: DC | PRN

## 2018-12-30 MED ORDER — BUDESONIDE 0.25 MG/2ML IN SUSP
0.2500 mg | Freq: Two times a day (BID) | RESPIRATORY_TRACT | Status: DC
  Administered 2018-12-30 – 2018-12-31 (×2): 0.25 mg via RESPIRATORY_TRACT
  Filled 2018-12-30 (×3): qty 2

## 2018-12-30 MED ORDER — ENSURE ENLIVE PO LIQD
237.0000 mL | Freq: Two times a day (BID) | ORAL | Status: DC
  Administered 2018-12-31 (×2): 237 mL via ORAL

## 2018-12-30 MED ORDER — ATORVASTATIN CALCIUM 10 MG PO TABS
20.0000 mg | ORAL_TABLET | Freq: Every evening | ORAL | Status: DC
  Administered 2018-12-30: 20 mg via ORAL
  Filled 2018-12-30: qty 2

## 2018-12-30 MED ORDER — SENNOSIDES-DOCUSATE SODIUM 8.6-50 MG PO TABS: 1.0000 | ORAL_TABLET | Freq: Every evening | ORAL | Status: DC | PRN

## 2018-12-30 MED ORDER — ALBUTEROL SULFATE HFA 108 (90 BASE) MCG/ACT IN AERS
2.0000 | INHALATION_SPRAY | RESPIRATORY_TRACT | Status: DC | PRN
  Administered 2018-12-30: 2 via RESPIRATORY_TRACT
  Filled 2018-12-30: qty 6.7

## 2018-12-30 NOTE — TOC Initial Note (Signed)
Transition of Care [facility]) - Initial/Assessment Note    Patient Details  Name: Denise [name] MRN: [id number] Date of Birth: 1945-12-26  Transition of Care [facility]) CM/SW Contact:    Sharin Mons, RN Phone Number: 12/30/2018, 6:44 PM  Clinical Narrative:      Admitted with acute on chronic respiratory failure, COPD exacerbation versus CHF. Recently hospitalized (11/27/2018-12/06/2018), with ischemic right thalamic stroke, multiple territory stroke with recent ischemic stroke. From [facility] SN[facility].      Kimberlea Schlag (Spouse)    [phone]      NCM spoke with husband about d/c planning.Husband states he would like for pt to d/c to home when medically stable. States there's is a Education officer, museum @ the SNF facility, and feels he can manage wife better @ home. States between himself,  pt's 4 sisters and 2 daughters pt will be fine @ home with family caring for her. Agreeable to home health services. Choice given. States has used [facility] in the past and would like to use them again.   PT/OTevaluations pending.... Palliative consult ( [place]) pending ....  TOC monitoring and following for needs.  Expected Discharge Plan: [facility] Barriers to Discharge: Continued Medical Work up   Patient Goals and CMS Choice        Expected Discharge Plan and Services Expected Discharge Plan: [place]  Prior Living Arrangements/Services  Activities of Daily Living Home Assistive Devices/Equipment: Oxygen ADL Screening (condition at time of admission) Is the patient deaf or have difficulty hearing?: No Does the patient have difficulty seeing, even when wearing glasses/contacts?: No Does the patient have difficulty concentrating, remembering, or making decisions?: Yes Patient able to express need for assistance with ADLs?: Yes Does the patient have difficulty dressing or bathing?: Yes Independently performs ADLs?: No Communication:  Independent Dressing (OT): Needs assistance Is this a change from baseline?: Pre-admission baseline Grooming: Needs assistance Is this a change from baseline?: Pre-admission baseline Feeding: Needs assistance Is this a change from baseline?: Pre-admission baseline Bathing: Independent with device (comment), Needs assistance(Uses walker) Is this a change from baseline?: Pre-admission baseline Toileting: Needs assistance, Independent with device (comment) Is this a change from baseline?: Pre-admission baseline In/Out Bed: Needs assistance Is this a change from baseline?: Pre-admission baseline Walks in Home: Needs assistance Is this a change from baseline?: Pre-admission baseline Does the patient have difficulty walking or climbing stairs?: Yes Weakness of Legs: Both Weakness of Arms/Hands: None  Permission Sought/Granted                  Emotional Assessment              Admission diagnosis:  Acute on chronic respiratory failure with hypoxia ([name]) [J96.21] Acute on chronic respiratory failure with hypoxia and hypercapnia ([name]) [J96.21, J96.22] Congestive heart failure, unspecified HF chronicity, unspecified heart failure type ([place]) [I50.9] Patient Active Problem List   Diagnosis Date Noted  . Acute on chronic respiratory failure with hypoxia ([place]) 12/30/2018  . Acute on chronic diastolic CHF (congestive heart failure) ([place]) 12/30/2018  . Acute ischemic right MCA stroke ([place]) 11/27/2018  . Vaginal candidiasis   . Acute blood loss anemia   . Supplemental oxygen dependent   . Uncontrolled type 2 diabetes mellitus with hyperglycemia ([place])   . ICH (intracerebral hemorrhage) ([place]) s/p tPA administration 11/08/2018  . Hypertensive urgency 11/08/2018  . Cerebellar stroke, acute ([place]) 11/08/2018  . History of ischemic stroke 11/04/2018  . COPD with acute exacerbation ([place]) 10/28/2018  .  Acute on chronic systolic CHF (congestive heart failure) ([place]) 10/28/2018  . Elevated  troponin 10/28/2018  . Lactic acidosis 10/28/2018  . GERD (gastroesophageal reflux disease) 10/28/2018  . Acute on chronic respiratory failure with hypercapnia ([place]) 10/28/2018  . Diabetes mellitus without complication-diet controled 10/28/2018  . On home oxygen therapy 07/12/2018  . CHF (congestive heart failure) ([place]) 05/16/2018  . Chronic respiratory failure with hypoxia and hypercapnia ([place]) 05/03/2018  . Borderline abnormal TFTs 05/02/2018  . Type 2 diabetes mellitus ([place]) 04/26/2018  . Unintended weight loss 04/26/2018  . Lower extremity edema 02/20/2018  . Chronic respiratory failure with hypoxia ([place]) 10/02/2017  . Hematuria 09/27/2017  . Pulmonary hypertension ([name]) c/w cor pulmonale   . Protein-calorie malnutrition, severe 09/17/2017  . Constipation   . Hyperparathyroidism, primary ([place]) 07/27/2016  . Loss of appetite 06/02/2016  . COPD GOLD  II  spirometry, severe Emphysema and 02 dep 11/22/2015  . Vitamin D deficiency 11/22/2015  . Anxiety and depression 11/22/2015  . Insomnia 11/22/2015  . Hyperlipidemia 11/22/2015  . Essential hypertension 11/22/2015  . Hx of colonic polyps 02/10/2013  . History of colon cancer 06/11/1986   PCP:  [name], NP Pharmacy:   [id number] [place], [place] [place] [place] [address]Phone: [phone]: [phone] [facility] [address] [place]Phone: [phone]: [phone]    Social Determinants of Health (SDOH) Interventions    Readmission Risk Interventions No flowsheet data found.

## 2018-12-30 NOTE — ED Notes (Signed)
Attempted x 2 for IV stick unsuccessful. IV consulted.

## 2018-12-30 NOTE — H&P (Signed)
History and Physical    Denise [name] BSW:[id number] DOB: October 16, 1945 DOA: 12/30/2018  PCP: [name], NP Patient coming from: Skilled nursing facility  I have personally briefly reviewed patient's old medical records in [place]  Chief Complaint: Worsening shortness of breath/hypoxia  HPI: REANA [name] is a 73 y.o. female with medical history significant of diabetes mellitus type 2 diet-controlled, hypertension, chronic respiratory failure secondary to COPD on 4 L O2, pulmonary hypertension recently hospitalized (11/27/2018-12/06/2018), with ischemic right thalamic stroke, multiple territory stroke with recent ischemic stroke status post TPA, currently on anticoagulation with Eliquis, who presents to ED with a 4-day history of worsening shortness of breath.  Patient denies any fevers, no productive cough, no chills, no wheezing, no chest pain, no abdominal pain, no diarrhea, no melena, no hematemesis, no hematochezia, no syncopal episode.  Patient does endorse some constipation.  History from ED physicians notes notes that EMS were called to the facility and patient was on 2 L O2 and noted to have sats of 85%.  Patient's nasal cannula was increased from 2 to 5 L O2 remained at 99% and subsequently decreased down to 4 L and still remained at 99%.  Patient was subsequently brought to the ED for further evaluation.  Patient noted to have been discharged during recent hospitalization on 4 L oxygen on her baseline chronic hypoxic respiratory failure.  ED Course: Patient seen in the ED, VBG done with pH of 7.368, PCO2 of 66, PO2 of 29, bicarb of 38.3.  Basic metabolic profile done with a BUN of 39 creatinine of 1.22 otherwise was within normal limits.  Ionized calcium of 1.49.  BNP done was 908.4.  CBC done was unremarkable.  Covid 19 PCR negative.  SARS coronavirus 2 PCR negative.  Chest x-ray done was stable.  Urinalysis pending.  Patient given a dose of IV Lasix and IV  steroids.  Review of Systems: As per HPI otherwise 10 point review of systems negative.   Past Medical History:  Diagnosis Date  . Altered mental status   . Anxiety and depression   . Chronic respiratory failure with hypoxia ([place]) 10/02/2017   Assoc with ? Cor pulmonale dx 09/2017 so rx 02 1-2 lpm 24/7  - 10/17/2017  Walked RA x 3 laps @ 185 ft each stopped due to  End of study, nl pace,  desat to 87 on 3rd lap - 12/19/2017  Saturations on Room Air at Rest =91 % and while Ambulating = 87%  But  on  2 Liters of pulsed oxygen while Ambulating =93% so rec POC 2lpm walking / use at rest if sats under 90%      . Colon cancer ([place]) 1988   Resected  . COPD (chronic obstructive pulmonary disease) ([place])   . Diabetes mellitus without complication ([name])    diet controlled- no meds. per pt  . Diarrhea 04/02/2018  . Diverticulosis   . Hyperlipidemia   . Hypertension   . Hypoxia 10/30/2018  . Insomnia   . Primary hyperparathyroidism ([place])   . SBO (small bowel obstruction) ([place])   . Stroke ([place]) 10/2018   tpa administered  . Tubular adenoma of rectum 02/23/2013   low grade  . Vitamin D deficiency     Past Surgical History:  Procedure Laterality Date  . ABDOMINAL HYSTERECTOMY  08/2015  . BREAST BIOPSY Left   . BREAST EXCISIONAL BIOPSY Right   . COLON RESECTION  1980's  . COLONOSCOPY    . ENDOMETRIAL  BIOPSY  2009   negative  . INCONTINENCE SURGERY  2017  . LOOP RECORDER INSERTION N/A 11/08/2018   Procedure: LOOP RECORDER INSERTION;  Surgeon: Constance Haw, MD;  Location: [place] [facility];  Service: Cardiovascular;  Laterality: N/A;  . RIGHT HEART CATH N/A 09/20/2017   Procedure: RIGHT HEART CATH;  Surgeon: Larey Dresser, MD;  Location: [place] [facility];  Service: Cardiovascular;  Laterality: N/A;  . RIGHT/LEFT HEART CATH AND CORONARY ANGIOGRAPHY N/A 03/19/2018   Procedure: RIGHT/LEFT HEART CATH AND CORONARY ANGIOGRAPHY;  Surgeon: Larey Dresser, MD;  Location: [facility];  Service: Cardiovascular;  Laterality: N/A;     reports that she quit smoking about 32 years ago. Her smoking use included cigarettes. She has a 30.00 pack-year smoking history. She has never used smokeless tobacco. She reports that she does not drink alcohol or use drugs.  No Known Allergies  Family History  Problem Relation Age of Onset  . Ovarian cancer Mother   . Breast cancer Neg Hx   . Hyperparathyroidism Neg Hx   . Colon cancer Neg Hx   . Esophageal cancer Neg Hx   . Stomach cancer Neg Hx    Per chart mother deceased from ovarian cancer.  In discussion with patient patient somewhat confused and insist that both her mother and father alive at age 42 and 60.  Prior to Admission medications   Medication Sig Start Date End Date Taking? Authorizing Provider  albuterol (VENTOLIN HFA) 108 (90 Base) MCG/ACT inhaler INHALE 1-2 PUFFS INTO THE LUNGS EVERY 4 HOURS AS NEEDED FOR WHEEZING OR SHORTNESS OF BREATH. Patient taking differently: Inhale 1-2 puffs into the lungs every 4 (four) hours as needed for wheezing or shortness of breath. INHALE 1-2 PUFFS INTO THE LUNGS EVERY 4 HOURS AS NEEDED FOR WHEEZING OR SHORTNESS OF BREATH. 04/30/18   Parrett, Fonnie Mu, NP  apixaban (ELIQUIS) 5 MG TABS tablet Take 1 tablet (5 mg total) by mouth 2 (two) times daily. 12/06/18   Aline August, MD  atorvastatin (LIPITOR) 20 MG tablet Take 1 tablet (20 mg total) by mouth every evening. For cholesterol 12/12/18   [name], NP  Blood Glucose Calibration (ACCU-CHEK AVIVA) SOLN USE AS INSTRUCTED TO TEST BLOOD SUGAR DAILY Patient taking differently: 1 each by Other route daily.  05/28/18   [name], NP  Blood Glucose Monitoring Suppl (ACCU-CHEK AVIVA PLUS) w/Device KIT Use as instructed to test blood sugar up to 3 times daily Patient taking differently: 1 each by Other route 3 (three) times daily.  05/28/18   [name], NP  fluticasone furoate-vilanterol (BREO ELLIPTA) 200-25 MCG/INH  AEPB Inhale 1 puff into the lungs daily. 11/25/18   Tanda Rockers, MD  furosemide (LASIX) 20 MG tablet Take 40 mg by mouth daily.     [provider]  glucose blood (ACCU-CHEK AVIVA PLUS) test strip Use as instructed to test blood sugar up to 3 times daily 05/28/18   [name], NP  Lancets (ACCU-CHEK SOFT TOUCH) lancets Use as instructed to test blood sugar up to 3 times daily Patient taking differently: 1 each by Other route 3 (three) times daily.  05/28/18   [name], NP  metoprolol tartrate (LOPRESSOR) 25 MG tablet TAKE 0.5 TABLETS (12.5 MG TOTAL) BY MOUTH 2 (TWO) TIMES DAILY. 12/12/18   [name], NP  mirtazapine (REMERON) 15 MG tablet TAKE 1 TABLET BY MOUTH AT BEDTIME. FOR DEPRESSION AND APPETITE. Patient  taking differently: Take 15 mg by mouth at bedtime.  09/25/18   [name], NP  OXYGEN Inhale 4 L into the lungs continuous.     [provider]  potassium chloride (KLOR-CON M10) 10 MEQ tablet Take 1 tablet (10 mEq total) by mouth daily. Patient taking differently: Take 5 mEq by mouth daily.  11/19/18   Love, Ivan Anchors, PA-C  spironolactone (ALDACTONE) 25 MG tablet Take 1 tablet (25 mg total) by mouth daily. Take at night 11/19/18   Love, Pamela S, PA-C  umeclidinium bromide (INCRUSE ELLIPTA) 62.5 MCG/INH AEPB Inhale 1 puff into the lungs daily. 11/20/18   Bary Leriche, PA-C    Physical Exam: Vitals:   12/30/18 1545 12/30/18 1600 12/30/18 1615 12/30/18 1630  BP: (!) 145/88 (!) 154/77 138/81 (!) 152/132  Pulse:   (!) 32 84  Resp: (!) 24 (!) 36 (!) 35   Temp:      TempSrc:      SpO2:   98% 97%  Weight:      Height:        Constitutional: Frail.  Cachectic.  Temporal wasting. Vitals:   12/30/18 1545 12/30/18 1600 12/30/18 1615 12/30/18 1630  BP: (!) 145/88 (!) 154/77 138/81 (!) 152/132  Pulse:   (!) 32 84  Resp: (!) 24 (!) 36 (!) 35   Temp:      TempSrc:      SpO2:   98% 97%  Weight:      Height:       Eyes: PERRL, lids  and conjunctivae normal ENMT: Mucous membranes are dry. Posterior pharynx clear of any exudate or lesions.Normal dentition.  Neck: normal, supple, no masses, no thyromegaly Respiratory: Poor air movement.  Decreased breath sounds in the bases.  No crackles.  No wheezing noted.  Some increased work of breathing.   Cardiovascular: Regular rate and rhythm, no murmurs / rubs / gallops. No extremity edema. 2+ pedal pulses. No carotid bruits.  Abdomen: no tenderness, no masses palpated. No hepatosplenomegaly. Bowel sounds positive.  Musculoskeletal: no clubbing / cyanosis. No joint deformity upper and lower extremities. Good ROM, no contractures. Normal muscle tone.  Skin: no rashes, lesions, ulcers. No induration Neurologic: CN 2-12 grossly intact. Sensation intact, DTR normal. Strength 5/5 in right upper extremity and right lower extremity.  4/5 left upper extremity and left lower extremity strength..  Psychiatric: Normal judgment and insight. Alert and oriented x 3. Normal mood.   Labs on Admission: I have personally reviewed following labs and imaging studies  CBC: Recent Labs  Lab 12/30/18 1148 12/30/18 1321  WBC 6.5  --   NEUTROABS 5.1  --   HGB 14.7 14.3  HCT 47.4* 42.0  MCV 85.9  --   PLT 217  --    Basic Metabolic Panel: Recent Labs  Lab 12/30/18 1148 12/30/18 1321  NA 145 145  K 4.6 4.2  CL 100  --   CO2 30  --   GLUCOSE 223*  --   BUN 39*  --   CREATININE 1.22*  --   CALCIUM 11.9*  --    GFR: Estimated Creatinine Clearance: 33.8 mL/min (A) (by C-G formula based on SCr of 1.22 mg/dL (H)). Liver Function Tests: No results for input(s): AST, ALT, ALKPHOS, BILITOT, PROT, ALBUMIN in the last 168 hours. No results for input(s): LIPASE, AMYLASE in the last 168 hours. No results for input(s): AMMONIA in the last 168 hours. Coagulation Profile: No results for input(s): INR, PROTIME in  the last 168 hours. Cardiac Enzymes: No results for input(s): CKTOTAL, CKMB, CKMBINDEX,  TROPONINI in the last 168 hours. BNP (last 3 results) Recent Labs    03/01/18 1218 05/02/18 1249 06/24/18 1111  PROBNP 1,336.0* 910.0* 840.0*   HbA1C: No results for input(s): HGBA1C in the last 72 hours. CBG: No results for input(s): GLUCAP in the last 168 hours. Lipid Profile: No results for input(s): CHOL, HDL, LDLCALC, TRIG, CHOLHDL, LDLDIRECT in the last 72 hours. Thyroid Function Tests: No results for input(s): TSH, T4TOTAL, FREET4, T3FREE, THYROIDAB in the last 72 hours. Anemia Panel: No results for input(s): VITAMINB12, FOLATE, FERRITIN, TIBC, IRON, RETICCTPCT in the last 72 hours. Urine analysis:    Component Value Date/Time   COLORURINE YELLOW 11/27/2018 [place] 11/27/2018 1534   LABSPEC 1.019 11/27/2018 1534   PHURINE 7.0 11/27/2018 1534   GLUCOSEU NEGATIVE 11/27/2018 1534   HGBUR NEGATIVE 11/27/2018 [place] 11/27/2018 1534   BILIRUBINUR Negative 09/28/2017 [place] 11/27/2018 1534   PROTEINUR 30 (A) 11/27/2018 1534   UROBILINOGEN 0.2 09/28/2017 1604   NITRITE NEGATIVE 11/27/2018 1534   LEUKOCYTESUR NEGATIVE 11/27/2018 1534    Radiological Exams on Admission: DG Chest Port 1 View  Result Date: 12/30/2018 CLINICAL DATA:  Shortness of breath EXAM: PORTABLE CHEST 1 VIEW COMPARISON:  12/03/2018 FINDINGS: Normal heart size. Aortic tortuosity. There is an implantable loop recorder. Interstitial coarsening in the setting of emphysema. Asymmetric density at the medial right apex, likely summation shadows in this rotated patient. There is no edema, air bronchogram, effusion, or pneumothorax. IMPRESSION: Stable compared to prior.  No acute finding. Emphysema. Electronically Signed   By: Monte Fantasia M.D.   On: 12/30/2018 09:38    EKG: Independently reviewed.  Sinus tachycardia with some PVCs.  Assessment/Plan Principal Problem:   Acute on chronic respiratory failure with hypoxia ([name]) Active Problems:   COPD  with acute exacerbation ([name])   COPD GOLD  II  spirometry, severe Emphysema and 02 dep   Vitamin D deficiency   Anxiety and depression   Hyperlipidemia   Essential hypertension   Hyperparathyroidism, primary ([place])   Protein-calorie malnutrition, severe   Constipation   Pulmonary hypertension ([name]) c/w cor pulmonale   Chronic respiratory failure with hypoxia ([name])   On home oxygen therapy   GERD (gastroesophageal reflux disease)   Diabetes mellitus without complication-diet controled   History of ischemic stroke   ICH (intracerebral hemorrhage) ([name]) s/p tPA administration   Acute on chronic diastolic CHF (congestive heart failure) ([name])   1 acute on chronic respiratory failure with hypoxia likely secondary to acute COPD exacerbation +/-acute on chronic diastolic CHF exacerbation. Patient admitted with acute on chronic respiratory failure with sats in the 85% on 2 L nasal cannula.  Patient with poor air movement.  Patient noted on prior discharge summary to be discharged on 4 L nasal cannula.  Chest x-ray negative for any acute infiltrates.  BNP was elevated at 908.  Prior BNP was 1586 on 10/28/2018.  Patient on exam does not clinically volume overloaded.  Patient with poor air movement on exam.  Patient received a dose of IV Lasix in the ED.  No significant output noted on p.o. awake.  Will place on Solu-Medrol 60 mg IV every 6 hours, Pulmicort, scheduled Xopenex and Atrovent nebs, Claritin, Flonase, PPI, doxycycline.  We will give a dose of Lasix 20 mg IV x1 and monitor urine output.  Continue home regimen spironolactone.  We  will hold on further diuresis at this time.  Follow.  2.  Recent acute ischemic right thalamic stroke/multiple territory stroke with recent ischemic stroke Noted during last hospitalization.  Patient had stroke work-up done was initially on aspirin and Plavix and subsequently switched to Eliquis for secondary stroke prophylaxis.  PT/OT/SLP.  Continue statin.  3.   Hypertension Resume home regimen Lopressor and spironolactone.  4.  Diabetes mellitus type 2 Hemoglobin A1c 7.0 on 11/27/2018.  Placed on a sliding scale insulin.  Follow.  5.  Hyperlipidemia Continue statin.  6.  Severe protein calorie malnutrition/failure to thrive Place on Ensure.  Consult with dietitian.  Palliative care consult for goals of care.  7.  GERD PPI.   DVT prophylaxis: Eliquis Code Status: Full Family Communication: Updated patient.  No family at bedside. Disposition Plan: Likely back to skilled nursing facility once clinically stable. Consults called: Palliative care pending Admission status: Place in observation.   Irine Seal MD [facility]  If 7PM-7AM, please contact night-coverage [email or web address]  12/30/2018, 5:09 PM

## 2018-12-30 NOTE — ED Provider Notes (Signed)
Patient admitted to medicine for acute on chronic respiratory failure likely in the setting of COPD exacerbation versus CHF.  Patient is alert but tachypneic.  Decreased air movement throughout on exam.  Admitted to medicine for further care.   Lennice Sites, DO 12/30/18 [phone]

## 2018-12-30 NOTE — ED Notes (Signed)
Spoke with patients husband on the phone [name] update given.Patient was also able to talk with her husband.

## 2018-12-30 NOTE — Telephone Encounter (Signed)
Patient's husband called today . He stated that the patient is currently in the hospital.   Husband said the patient is staying at [facility] due to having a stroke. Patient had several falls while being there and he is trying to get the patient home and get home health services there for her.  He was advised to call our office and speak to her primary to see if they could help

## 2018-12-30 NOTE — ED Notes (Signed)
Got patient undress on the monitor did ekg shown to Dr Jeanell Sparrow patient is resting with call bell in reach

## 2018-12-30 NOTE — Telephone Encounter (Signed)
Patient currently admitted to the hospital. They should link her up with [facility] post discharge but if not then I am happy to help.

## 2018-12-30 NOTE — ED Provider Notes (Signed)
[facility] EMERGENCY DEPARTMENT Provider Note   CSN: [id number] Arrival date & time: 12/30/18  2993     History No chief complaint on file.   Denise [name] is a 73 y.o. female.  HPI  73 yo female ho chronic respiratory failure, copd presents via ems from snf. Patient recently discharged to [facility] admissions for stroke- patient with ischemic stroke with hemmorhagic conversion, then repeat acute stroke.  Patient d/c'd on eliquis. D/C summary reporting copd with 4 liters oxygen dependence.  Patient recently admitted to skilled nursing facility after stroke.  She is normally on 2 L of oxygen.  Per EMS report, they were called out today due to low oxygen saturations.  They arrived to find the patient on her usual 2 L with oxygen saturations approximately 85%.  They increased her nasal cannula from 2 to 5 L and oxygen saturations increased to 99%.  They then decrease it down to 4 L and oxygen saturations remained at 99%.  Patient states that she had some increased dyspnea this morning.  She denies any fever, chest pain, or productive cough.  She states that she normally walks with a walker.  She is unable to give a more detailed history. Nurses noets reviewed and ho reported fall this am.  No complaints of pain or injury from fall when patient questioned.  Loop recorder evidence of a fib noted from cardiology notes.    Past Medical History:  Diagnosis Date  . Altered mental status   . Anxiety and depression   . Chronic respiratory failure with hypoxia ([place]) 10/02/2017   Assoc with ? Cor pulmonale dx 09/2017 so rx 02 1-2 lpm 24/7  - 10/17/2017  Walked RA x 3 laps @ 185 ft each stopped due to  End of study, nl pace,  desat to 87 on 3rd lap - 12/19/2017  Saturations on Room Air at Rest =91 % and while Ambulating = 87%  But  on  2 Liters of pulsed oxygen while Ambulating =93% so rec POC 2lpm walking / use at rest if sats under 90%      . Colon cancer ([place]) 1988   Resected    . COPD (chronic obstructive pulmonary disease) ([place])   . Diabetes mellitus without complication ([name])    diet controlled- no meds. per pt  . Diarrhea 04/02/2018  . Diverticulosis   . Hyperlipidemia   . Hypertension   . Hypoxia 10/30/2018  . Insomnia   . Primary hyperparathyroidism ([place])   . SBO (small bowel obstruction) ([place])   . Stroke ([place]) 10/2018   tpa administered  . Tubular adenoma of rectum 02/23/2013   low grade  . Vitamin D deficiency     Discharge Diagnoses:  Acute ischemic right thalamic stroke, multiple territory stroke with recent ischemic stroke - MRI brain acute infarct centered within the right thalamocapsular junction  - CTA head/neck no large vessel occlusion. No significant stenosis at the ICA origins -Patient was initially placed on aspirin and Plavix as per neurology recommendations but subsequently switched to Eliquis alone.  Neurology has signed off.  Outpatient follow-up with neurology. -PT/OT recommending SNF placement.  Diet as per SLP recommendations. -Discharge to SNF once bed is available   COPD Chronic hypoxic respiratory failure on 4 L oxygen at baseline -Currently back on 4 L oxygen.  Received few doses of intravenous Lasix intermittently over the last couple of days. -Continue inhalers.  Continue nebs as needed.  Chest x-Nicha Hemann on 12/03/2018 was negative.  Essential hypertension Hypertensive urgency -Blood pressure still on the higher side. -Monitor blood pressure.  Neurology had recommended permissive hypertension for 5 to 7 days.   Resume metoprolol, spironolactone and Lasix.  BiDil on hold for now, this can be resumed at SNF if blood pressure remains elevated.   Diabetes mellitus type 2 -A1c 7 recently.    Carb modified diet.  Outpatient follow-up.   Prolonged QTC -Improved.   Hyperlipidemia -Continue Lipitor.   Hypernatremia -Improved   Hypercalcemia -Improving.  No labs today.  Off IV fluids.     Discharge  Instructions Brief/Interim Summary: 73 year old female with history of diabetes mellitus type 2 not on treatment, hypertension, COPD with chronic hypoxic respiratory failure on 4 L oxygen, pulmonary hypertension who was recently admitted to the hospital with acute ischemic stroke, status post TPA with hemorrhagic conversion treated with cryoprecipitate and tranexamic acid, went to acute inpatient rehab and discharged home on 11/19/2018 presented back to the ED on 11/27/2018 with worsening weakness on the left side.  MRI showed new right thalamocapsular stroke with old hemorrhagic strokes.  CTA with no acute occlusion, severe stenosis of left vertebral artery origin.  Neurology was consulted.  PT recommended SNF placement.  Patient is currently on Eliquis as per neurology recommendations.  Outpatient follow-up with neurology.  She is medically stable for discharge to SNF.  Discharge to SNF once bed is available.    Patient Active Problem List   Diagnosis Date Noted  . Acute ischemic right MCA stroke ([place]) 11/27/2018  . Vaginal candidiasis   . Acute blood loss anemia   . Supplemental oxygen dependent   . Uncontrolled type 2 diabetes mellitus with hyperglycemia ([place])   . ICH (intracerebral hemorrhage) ([place]) s/p tPA administration 11/08/2018  . Hypertensive urgency 11/08/2018  . Cerebellar stroke, acute ([place]) 11/08/2018  . History of ischemic stroke 11/04/2018  . COPD exacerbation ([place]) 10/28/2018  . Acute on chronic systolic CHF (congestive heart failure) ([place]) 10/28/2018  . Elevated troponin 10/28/2018  . Lactic acidosis 10/28/2018  . GERD (gastroesophageal reflux disease) 10/28/2018  . Acute on chronic respiratory failure with hypercapnia ([place]) 10/28/2018  . Diabetes mellitus without complication-diet controled 10/28/2018  . On home oxygen therapy 07/12/2018  . CHF (congestive heart failure) ([place]) 05/16/2018  . Chronic respiratory failure with hypoxia and hypercapnia ([place]) 05/03/2018  .  Borderline abnormal TFTs 05/02/2018  . Type 2 diabetes mellitus ([place]) 04/26/2018  . Unintended weight loss 04/26/2018  . Lower extremity edema 02/20/2018  . Chronic respiratory failure with hypoxia ([place]) 10/02/2017  . Hematuria 09/27/2017  . Pulmonary hypertension ([name]) c/w cor pulmonale   . Protein-calorie malnutrition, severe 09/17/2017  . Constipation   . Hyperparathyroidism, primary ([place]) 07/27/2016  . Loss of appetite 06/02/2016  . COPD GOLD  II  spirometry, severe Emphysema and 02 dep 11/22/2015  . Vitamin D deficiency 11/22/2015  . Anxiety and depression 11/22/2015  . Insomnia 11/22/2015  . Hyperlipidemia 11/22/2015  . Essential hypertension 11/22/2015  . Hx of colonic polyps 02/10/2013  . History of colon cancer 06/11/1986    Past Surgical History:  Procedure Laterality Date  . ABDOMINAL HYSTERECTOMY  08/2015  . BREAST BIOPSY Left   . BREAST EXCISIONAL BIOPSY Right   . COLON RESECTION  1980's  . COLONOSCOPY    . ENDOMETRIAL BIOPSY  2009   negative  . INCONTINENCE SURGERY  2017  . LOOP RECORDER INSERTION N/A 11/08/2018   Procedure: LOOP RECORDER INSERTION;  Surgeon: Constance Haw, MD;  Location: [facility] [place]  CV LAB;  Service: Cardiovascular;  Laterality: N/A;  . RIGHT HEART CATH N/A 09/20/2017   Procedure: RIGHT HEART CATH;  Surgeon: Larey Dresser, MD;  Location: [place] CV LAB;  Service: Cardiovascular;  Laterality: N/A;  . RIGHT/LEFT HEART CATH AND CORONARY ANGIOGRAPHY N/A 03/19/2018   Procedure: RIGHT/LEFT HEART CATH AND CORONARY ANGIOGRAPHY;  Surgeon: Larey Dresser, MD;  Location: [place] CV LAB;  Service: Cardiovascular;  Laterality: N/A;     OB History   No obstetric history on file.     Family History  Problem Relation Age of Onset  . Ovarian cancer Mother   . Breast cancer Neg Hx   . Hyperparathyroidism Neg Hx   . Colon cancer Neg Hx   . Esophageal cancer Neg Hx   . Stomach cancer Neg Hx     Social History   Tobacco Use  .  Smoking status: Former Smoker    Packs/day: 1.00    Years: 30.00    Pack years: 30.00    Types: Cigarettes    Quit date: 1989    Years since quitting: 32.0  . Smokeless tobacco: Never Used  Substance Use Topics  . Alcohol use: No  . Drug use: No    Home Medications Prior to Admission medications   Medication Sig Start Date End Date Taking? Authorizing Provider  albuterol (VENTOLIN HFA) 108 (90 Base) MCG/ACT inhaler INHALE 1-2 PUFFS INTO THE LUNGS EVERY 4 HOURS AS NEEDED FOR WHEEZING OR SHORTNESS OF BREATH. Patient taking differently: Inhale 1-2 puffs into the lungs every 4 (four) hours as needed for wheezing or shortness of breath. INHALE 1-2 PUFFS INTO THE LUNGS EVERY 4 HOURS AS NEEDED FOR WHEEZING OR SHORTNESS OF BREATH. 04/30/18   Parrett, Fonnie Mu, NP  apixaban (ELIQUIS) 5 MG TABS tablet Take 1 tablet (5 mg total) by mouth 2 (two) times daily. 12/06/18   Aline August, MD  atorvastatin (LIPITOR) 20 MG tablet Take 1 tablet (20 mg total) by mouth every evening. For cholesterol 12/12/18   [name], NP  Blood Glucose Calibration (ACCU-CHEK AVIVA) SOLN USE AS INSTRUCTED TO TEST BLOOD SUGAR DAILY Patient taking differently: 1 each by Other route daily.  05/28/18   [name], NP  Blood Glucose Monitoring Suppl (ACCU-CHEK AVIVA PLUS) w/Device KIT Use as instructed to test blood sugar up to 3 times daily Patient taking differently: 1 each by Other route 3 (three) times daily.  05/28/18   [name], NP  fluticasone furoate-vilanterol (BREO ELLIPTA) 200-25 MCG/INH AEPB Inhale 1 puff into the lungs daily. 11/25/18   Tanda Rockers, MD  furosemide (LASIX) 20 MG tablet Take 40 mg by mouth daily.     [provider]  glucose blood (ACCU-CHEK AVIVA PLUS) test strip Use as instructed to test blood sugar up to 3 times daily 05/28/18   [name], NP  Lancets (ACCU-CHEK SOFT TOUCH) lancets Use as instructed to test blood sugar up to 3 times daily Patient taking  differently: 1 each by Other route 3 (three) times daily.  05/28/18   [name], NP  metoprolol tartrate (LOPRESSOR) 25 MG tablet TAKE 0.5 TABLETS (12.5 MG TOTAL) BY MOUTH 2 (TWO) TIMES DAILY. 12/12/18   [name], NP  mirtazapine (REMERON) 15 MG tablet TAKE 1 TABLET BY MOUTH AT BEDTIME. FOR DEPRESSION AND APPETITE. Patient taking differently: Take 15 mg by mouth at bedtime.  09/25/18   [name], NP  OXYGEN Inhale 4 L into  the lungs continuous.     [provider]  potassium chloride (KLOR-CON M10) 10 MEQ tablet Take 1 tablet (10 mEq total) by mouth daily. Patient taking differently: Take 5 mEq by mouth daily.  11/19/18   Love, Ivan Anchors, PA-C  spironolactone (ALDACTONE) 25 MG tablet Take 1 tablet (25 mg total) by mouth daily. Take at night 11/19/18   Love, Pamela S, PA-C  umeclidinium bromide (INCRUSE ELLIPTA) 62.5 MCG/INH AEPB Inhale 1 puff into the lungs daily. 11/20/18   Bary Leriche, PA-C    Allergies    Patient has no known allergies.  Review of Systems   Review of Systems  All other systems reviewed and are negative.   Physical Exam Updated Vital Signs BP 137/84   Pulse (!) 101   Temp (!) 97.5 F (36.4 C)   Resp (!) 26   SpO2 100%   Physical Exam Vitals and nursing note reviewed.  Constitutional:      General: She is not in acute distress.    Appearance: She is well-developed and normal weight. She is ill-appearing.     Comments: Cachectic female sitting on the bed he does not appear to be in any acute distress  HENT:     Head: Normocephalic.  Eyes:     Pupils: Pupils are equal, round, and reactive to light.  Cardiovascular:     Rate and Rhythm: Normal rate and regular rhythm.  Pulmonary:     Effort: Tachypnea present.     Comments: Decreased breath sounds bilateral bases but no rales, rhonchi, or wheezes are noted Musculoskeletal:        General: Normal range of motion.     Cervical back: Normal range of motion.     Right lower  leg: No tenderness. No edema.     Left lower leg: No tenderness. No edema.  Skin:    General: Skin is warm.     Capillary Refill: Capillary refill takes less than 2 seconds.     Comments: Skin appears dry No wounds or skin breakdown is noted  Neurological:     General: No focal deficit present.     Mental Status: She is alert.     Cranial Nerves: No cranial nerve deficit.  Psychiatric:        Mood and Affect: Mood normal.     ED Results / Procedures / Treatments   Labs (all labs ordered are listed, but only abnormal results are displayed) Labs Reviewed  BASIC METABOLIC PANEL  CBC WITH DIFFERENTIAL/PLATELET  BRAIN NATRIURETIC PEPTIDE  BLOOD GAS, VENOUS  POC SARS CORONAVIRUS 2 AG -  ED    EKG EKG Interpretation  Date/Time:  Monday December 30 2018 09:20:10 EST Ventricular Rate:  104 PR Interval:    QRS Duration: 90 QT Interval:  357 QTC Calculation: 417 R Axis:   -76 Text Interpretation: Sinus tachycardia Multiform ventricular premature complexes Left anterior fascicular block Consider right ventricular hypertrophy Probable left ventricular hypertrophy Nonspecific T abnormalities, lateral leads Poor data quality Confirmed by Pattricia Boss [phone]) on 12/30/2018 9:58:33 AM   Radiology DG Chest Port 1 View  Result Date: 12/30/2018 CLINICAL DATA:  Shortness of breath EXAM: PORTABLE CHEST 1 VIEW COMPARISON:  12/03/2018 FINDINGS: Normal heart size. Aortic tortuosity. There is an implantable loop recorder. Interstitial coarsening in the setting of emphysema. Asymmetric density at the medial right apex, likely summation shadows in this rotated patient. There is no edema, air bronchogram, effusion, or pneumothorax. IMPRESSION: Stable compared to prior.  No acute finding. Emphysema. Electronically Signed   By: Monte Fantasia M.D.   On: 12/30/2018 09:38    Procedures .Critical Care Performed by: Pattricia Boss, MD Authorized by: Pattricia Boss, MD   Critical care provider  statement:    Critical care time (minutes):  45   Critical care end time:  12/30/2018 3:23 PM   Critical care was necessary to treat or prevent imminent or life-threatening deterioration of the following conditions:  Respiratory failure   Critical care was time spent personally by me on the following activities:  Discussions with consultants, evaluation of patient's response to treatment, examination of patient, ordering and performing treatments and interventions, ordering and review of laboratory studies, ordering and review of radiographic studies, pulse oximetry, re-evaluation of patient's condition, obtaining history from patient or surrogate and review of old charts   (including critical care time)  Medications Ordered in ED Medications  albuterol (VENTOLIN HFA) 108 (90 Base) MCG/ACT inhaler 2 puff (has no administration in time range)    ED Course  I have reviewed the triage vital signs and the nursing notes.  Pertinent labs & imaging results that were available during my care of the patient were reviewed by me and considered in my medical decision making (see chart for details). 73 year old female with severe COPD, some intermittent A. fib, recent stroke presents today with worsened dyspnea and decreased O2 sats. Pco2 increased to 66.  Patient with increased oxygen demand and now with decreased responsiveness.  BNP elevated at 908- lasix given. Discussed with Dr. Ronnald Nian and he will follow up end tidal co2- plan admission.  Will consider bipap if not improved     MDM Rules/Calculators/A&P                     73 year old female with final Clinical Impression(s) / ED Diagnoses Final diagnoses:  Acute on chronic respiratory failure with hypoxia and hypercapnia ([name])  Congestive heart failure, unspecified HF chronicity, unspecified heart failure type [facility])    Rx / DC Orders ED Discharge Orders    None       Pattricia Boss, MD 12/30/18 1524

## 2018-12-30 NOTE — ED Triage Notes (Signed)
Patient presents to ED via [facility] from [facility], they state her o2 sats dropped to 85% on 2liters this am. Ems states patient was tachypnea however denies sob and is able to speak in complete sentences. Patient is alert oriented , states she has been in the nursing home for 8 days because of low sats. Husband called and I spoke with him states he was told patient fell out of bed this am, when questioning patient she confirmed that she did fall out of bed however denies injury.

## 2018-12-31 ENCOUNTER — Inpatient Hospital Stay: Payer: Medicare HMO | Admitting: Adult Health

## 2018-12-31 ENCOUNTER — Telehealth: Payer: Self-pay | Admitting: *Deleted

## 2018-12-31 DIAGNOSIS — J441 Chronic obstructive pulmonary disease with (acute) exacerbation: Secondary | ICD-10-CM | POA: Diagnosis not present

## 2018-12-31 DIAGNOSIS — F29 Unspecified psychosis not due to a substance or known physiological condition: Secondary | ICD-10-CM | POA: Diagnosis not present

## 2018-12-31 DIAGNOSIS — E210 Primary hyperparathyroidism: Secondary | ICD-10-CM | POA: Diagnosis not present

## 2018-12-31 DIAGNOSIS — I509 Heart failure, unspecified: Secondary | ICD-10-CM | POA: Diagnosis not present

## 2018-12-31 DIAGNOSIS — M2550 Pain in unspecified joint: Secondary | ICD-10-CM | POA: Diagnosis not present

## 2018-12-31 DIAGNOSIS — E43 Unspecified severe protein-calorie malnutrition: Secondary | ICD-10-CM | POA: Diagnosis not present

## 2018-12-31 DIAGNOSIS — J449 Chronic obstructive pulmonary disease, unspecified: Secondary | ICD-10-CM | POA: Diagnosis not present

## 2018-12-31 DIAGNOSIS — J9621 Acute and chronic respiratory failure with hypoxia: Secondary | ICD-10-CM | POA: Diagnosis not present

## 2018-12-31 DIAGNOSIS — Z7401 Bed confinement status: Secondary | ICD-10-CM | POA: Diagnosis not present

## 2018-12-31 DIAGNOSIS — E119 Type 2 diabetes mellitus without complications: Secondary | ICD-10-CM | POA: Diagnosis not present

## 2018-12-31 DIAGNOSIS — R64 Cachexia: Secondary | ICD-10-CM | POA: Diagnosis not present

## 2018-12-31 DIAGNOSIS — R5381 Other malaise: Secondary | ICD-10-CM | POA: Diagnosis not present

## 2018-12-31 DIAGNOSIS — I619 Nontraumatic intracerebral hemorrhage, unspecified: Secondary | ICD-10-CM | POA: Diagnosis not present

## 2018-12-31 LAB — COMPREHENSIVE METABOLIC PANEL
ALT: 22 U/L (ref 0–44)
AST: 22 U/L (ref 15–41)
Albumin: 3.3 g/dL — ABNORMAL LOW (ref 3.5–5.0)
Alkaline Phosphatase: 84 U/L (ref 38–126)
Anion gap: 17 — ABNORMAL HIGH (ref 5–15)
BUN: 56 mg/dL — ABNORMAL HIGH (ref 8–23)
CO2: 28 mmol/L (ref 22–32)
Calcium: 11.8 mg/dL — ABNORMAL HIGH (ref 8.9–10.3)
Chloride: 100 mmol/L (ref 98–111)
Creatinine, Ser: 1.58 mg/dL — ABNORMAL HIGH (ref 0.44–1.00)
GFR calc Af Amer: 37 mL/min — ABNORMAL LOW (ref >60)
GFR calc non Af Amer: 32 mL/min — ABNORMAL LOW (ref >60)
Glucose, Bld: 278 mg/dL — ABNORMAL HIGH (ref 70–99)
Potassium: 5.1 mmol/L (ref 3.5–5.1)
Sodium: 145 mmol/L (ref 135–145)
Total Bilirubin: 0.7 mg/dL (ref 0.3–1.2)
Total Protein: 6.7 g/dL (ref 6.5–8.1)

## 2018-12-31 LAB — GLUCOSE, CAPILLARY
Glucose-Capillary: 243 mg/dL — ABNORMAL HIGH (ref 70–99)
Glucose-Capillary: 298 mg/dL — ABNORMAL HIGH (ref 70–99)

## 2018-12-31 LAB — CBC
HCT: 41.9 % (ref 36.0–46.0)
Hemoglobin: 13.3 g/dL (ref 12.0–15.0)
MCH: 26.6 pg (ref 26.0–34.0)
MCHC: 31.7 g/dL (ref 30.0–36.0)
MCV: 83.8 fL (ref 80.0–100.0)
Platelets: 248 10*3/uL (ref 150–400)
RBC: 5 MIL/uL (ref 3.87–5.11)
RDW: 16.1 % — ABNORMAL HIGH (ref 11.5–15.5)
WBC: 5.1 10*3/uL (ref 4.0–10.5)
nRBC: 0 % (ref 0.0–0.2)

## 2018-12-31 LAB — BRAIN NATRIURETIC PEPTIDE: B Natriuretic Peptide: 785.1 pg/mL — ABNORMAL HIGH (ref 0.0–100.0)

## 2018-12-31 MED ORDER — INSULIN ASPART 100 UNIT/ML ~~LOC~~ SOLN: 0.0000 [IU] | Freq: Every day | SUBCUTANEOUS | Status: DC

## 2018-12-31 MED ORDER — IPRATROPIUM BROMIDE 0.02 % IN SOLN
0.5000 mg | Freq: Three times a day (TID) | RESPIRATORY_TRACT | Status: DC
  Administered 2018-12-31 (×2): 0.5 mg via RESPIRATORY_TRACT
  Filled 2018-12-31 (×2): qty 2.5

## 2018-12-31 MED ORDER — PREDNISONE 10 MG PO TABS: 40.0000 mg | ORAL_TABLET | Freq: Every day | ORAL | 0 refills | Status: AC

## 2018-12-31 MED ORDER — INSULIN ASPART 100 UNIT/ML ~~LOC~~ SOLN
0.0000 [IU] | Freq: Three times a day (TID) | SUBCUTANEOUS | Status: DC
  Administered 2018-12-31: 8 [IU] via SUBCUTANEOUS

## 2018-12-31 MED ORDER — LEVALBUTEROL HCL 0.63 MG/3ML IN NEBU
0.6300 mg | INHALATION_SOLUTION | Freq: Three times a day (TID) | RESPIRATORY_TRACT | Status: DC
  Administered 2018-12-31 (×2): 0.63 mg via RESPIRATORY_TRACT
  Filled 2018-12-31 (×2): qty 3

## 2018-12-31 MED FILL — predniSONE 10 MG TABS: 10 | 4 days supply | Qty: 16 | Fill #0

## 2018-12-31 NOTE — Discharge Summary (Signed)
Physician Discharge Summary  Denise [name] TTS:[id number] DOB: 1945-07-31 DOA: 12/30/2018  PCP: [name], NP  Admit date: 12/30/2018 Discharge date: 12/31/2018  Time spent: 45 minutes  Recommendations for Outpatient Follow-up:  1. Follow up with PCP 1-2 weeks for evaluation of symptoms 2. Home Health with PT/RN/Resp   Discharge Diagnoses:  Principal Problem:   Acute on chronic respiratory failure with hypoxia ([name]) Active Problems:   Chronic respiratory failure with hypoxia ([name])   COPD with acute exacerbation ([name])   Hyperlipidemia   Essential hypertension   Pulmonary hypertension ([name]) c/w cor pulmonale   CHF (congestive heart failure) ([place])   On home oxygen therapy   Diabetes mellitus without complication-diet controled   COPD GOLD  II  spirometry, severe Emphysema and 02 dep   Vitamin D deficiency   Anxiety and depression   Hyperparathyroidism, primary ([place])   Protein-calorie malnutrition, severe   ICH (intracerebral hemorrhage) ([place]) s/p tPA administration   Discharge Condition: stable  Diet recommendation: carb modified  Filed Weights   12/30/18 0946  Weight: 52.2 kg    History of present illness:  Denise [name] is a 73 y.o. female with medical history significant of diabetes mellitus type 2 diet-controlled, hypertension, chronic respiratory failure secondary to COPD on 4 L O2, pulmonary hypertension recently hospitalized (11/27/2018-12/06/2018), with ischemic right thalamic stroke, multiple territory stroke with recent ischemic stroke status post TPA, currently on anticoagulation with Eliquis, who presented 12/28 to ED with a 4-day history of worsening shortness of breath.  Patient denied any fevers, no productive cough, no chills, no wheezing, no chest pain, no abdominal pain, no diarrhea, no melena, no hematemesis, no hematochezia, no syncopal episode. History from ED physicians noted that EMS were called to the facility and patient was on 2  L O2 and noted to have sats of 85%.  Patient's nasal cannula was increased from 2 to 5 L O2 remained at 99% and subsequently decreased down to 4 L and still remained at 99%.  Patient was subsequently brought to the ED for further evaluation.  Patient noted to have been discharged during recent hospitalization on 4 L oxygen on her baseline chronic hypoxic respiratory failure.  Hospital Course:  1 acute on chronic respiratory failure with hypoxia likely secondary to acute COPD exacerbation +/-acute on chronic diastolic CHF exacerbation. Patient admitted with acute on chronic respiratory failure with sats in the 85% on 2 L nasal cannula.  Patient with poor air movement.  Patient noted on prior discharge summary to be discharged on 4 L nasal cannula.  Chest x-ray negative for any acute infiltrates.  BNP was elevated at 908.  Prior BNP was 1586 on 10/28/2018.  Patient on exam not clinically volume overloaded. Poor air movement in ED.  Patient received a dose of IV Lasix in the ED. Provided wih  Solu-Medrol 60 mg IV every 6 hours, Pulmicort, scheduled Xopenex and Atrovent nebs, Claritin, Flonase, PPI, doxycycline.  Provided 2nd dose of Lasix 20 mg IV x1. Has voided but amount not documented. On day of discharge patient at baseline from respiratory status. Will discharge with 4 days prednisone and HH respiratory care  2.  Recent acute ischemic right thalamic stroke/multiple territory stroke with recent ischemic stroke Noted during last hospitalization.  Patient had stroke work-up done was initially on aspirin and Plavix and subsequently switched to Eliquis for secondary stroke prophylaxis.  PT/OT/SLP.  Continue statin.  3.  Hypertension controlled. Lopressor and spironolactone.  4.  Diabetes mellitus type 2 Hemoglobin  A1c 7.0 on 11/27/2018.    5.  Hyperlipidemia Continue statin.  6.  Severe protein calorie malnutrition/failure to thrive Place on Ensure.  Consult with dietitian.    7.   GERD PPI.  Procedures:  Consultations:    Discharge Exam: Vitals:   12/31/18 0752 12/31/18 1112  BP:  118/84  Pulse:  67  Resp:  (!) 24  Temp:  (!) 97.5 F (36.4 C)  SpO2: 100% 95%    General: sitting in chair, thin frail cachetic no acute distress Cardiovascular: rrr no mgr no LE edema Respiratory: mild increased work of breathing, BS quite distant, no wheeze or crackles  Discharge Instructions   Discharge Instructions    Call MD for:  difficulty breathing, headache or visual disturbances   Complete by: As directed    Call MD for:  temperature >100.4   Complete by: As directed    Diet - low sodium heart healthy   Complete by: As directed    Discharge instructions   Complete by: As directed    Take medications as prescribed Follow up with PCP 1-2 weeks for evaluation of symptoms   Increase activity slowly   Complete by: As directed      Allergies as of 12/31/2018   No Known Allergies     Medication List    TAKE these medications   Accu-Chek Aviva Plus w/Device Kit Use as instructed to test blood sugar up to 3 times daily What changed:   how much to take  how to take this  when to take this  additional instructions   Accu-Chek Aviva Soln USE AS INSTRUCTED TO TEST BLOOD SUGAR DAILY What changed:   how much to take  how to take this  when to take this  additional instructions   accu-chek soft touch lancets Use as instructed to test blood sugar up to 3 times daily What changed:   how much to take  how to take this  when to take this  additional instructions   albuterol 108 (90 Base) MCG/ACT inhaler Commonly known as: VENTOLIN HFA INHALE 1-2 PUFFS INTO THE LUNGS EVERY 4 HOURS AS NEEDED FOR WHEEZING OR SHORTNESS OF BREATH. What changed:   how much to take  how to take this  when to take this  reasons to take this  additional instructions   apixaban 5 MG Tabs tablet Commonly known as: ELIQUIS Take 1 tablet (5 mg total) by  mouth 2 (two) times daily. What changed: when to take this   atorvastatin 20 MG tablet Commonly known as: LIPITOR Take 1 tablet (20 mg total) by mouth every evening. For cholesterol What changed: when to take this   Breo Ellipta 100-25 MCG/INH Aepb Generic drug: fluticasone furoate-vilanterol Inhale 1 puff into the lungs daily. What changed: Another medication with the same name was removed. Continue taking this medication, and follow the directions you see here.   furosemide 40 MG tablet Commonly known as: LASIX Take 40 mg by mouth daily.   glucose blood test strip Commonly known as: Accu-Chek Aviva Plus Use as instructed to test blood sugar up to 3 times daily   metoprolol tartrate 25 MG tablet Commonly known as: LOPRESSOR TAKE 0.5 TABLETS (12.5 MG TOTAL) BY MOUTH 2 (TWO) TIMES DAILY. What changed: when to take this   mirtazapine 15 MG tablet Commonly known as: REMERON TAKE 1 TABLET BY MOUTH AT BEDTIME. FOR DEPRESSION AND APPETITE. What changed:   how much to take  how to take this  when to take this  additional instructions   OXYGEN Inhale 4 L into the lungs continuous.   potassium chloride 10 MEQ tablet Commonly known as: Klor-Con M10 Take 1 tablet (10 mEq total) by mouth daily.   predniSONE 10 MG tablet Commonly known as: DELTASONE Take 4 tablets (40 mg total) by mouth daily for 4 days. Start taking on: January 01, 2019   spironolactone 25 MG tablet Commonly known as: ALDACTONE Take 1 tablet (25 mg total) by mouth daily. Take at night What changed: additional instructions   umeclidinium bromide 62.5 MCG/INH Aepb Commonly known as: INCRUSE ELLIPTA Inhale 1 puff into the lungs daily.      No Known Allergies    The results of significant diagnostics from this hospitalization (including imaging, microbiology, ancillary and laboratory) are listed below for reference.    Significant Diagnostic Studies: CT HEAD WO CONTRAST  Result Date:  12/02/2018 CLINICAL DATA:  73 year old female with acute right thalamic infarct, subacute right cerebellar hemorrhagic infarct. EXAM: CT HEAD WITHOUT CONTRAST TECHNIQUE: Contiguous axial images were obtained from the base of the skull through the vertex without intravenous contrast. COMPARISON:  Brain MRI 11/27/2018 and earlier. FINDINGS: Brain: Expected evolution of hypodensity in the right thalamus since the CT on 11/27/2018 (series 4, image 14). No associated hemorrhage or mass effect. Stable right cerebellum PICA territory infarct with petechial hemorrhage. No significant posterior fossa mass effect. Stable gray-white matter differentiation throughout the brain elsewhere with superimposed smaller chronic left cerebellar and deep gray nuclei infarcts. Small area of cortical encephalomalacia in the posterior right temporal lobe. No midline shift, ventriculomegaly, mass effect, evidence of mass lesion, or evidence of new cortically based infarction. Vascular: Calcified atherosclerosis at the skull base. No suspicious intracranial vascular hyperdensity. Skull: No acute osseous abnormality identified. Sinuses/Orbits: Visualized paranasal sinuses and mastoids are stable and well pneumatized. Other: No acute orbit or scalp soft tissue findings. IMPRESSION: 1. Expected evolution of the right thalamic infarct since 11/27/2018. No associated hemorrhage or mass effect. 2. Stable right cerebellum PICA infarct with petechial hemorrhage, no malignant hemorrhagic transformation or mass effect. 3. No new intracranial abnormality. Electronically Signed   By: Genevie Ann M.D.   On: 12/02/2018 20:24   DG Chest Port 1 View  Result Date: 12/30/2018 CLINICAL DATA:  Shortness of breath EXAM: PORTABLE CHEST 1 VIEW COMPARISON:  12/03/2018 FINDINGS: Normal heart size. Aortic tortuosity. There is an implantable loop recorder. Interstitial coarsening in the setting of emphysema. Asymmetric density at the medial right apex, likely  summation shadows in this rotated patient. There is no edema, air bronchogram, effusion, or pneumothorax. IMPRESSION: Stable compared to prior.  No acute finding. Emphysema. Electronically Signed   By: Monte Fantasia M.D.   On: 12/30/2018 09:38   DG CHEST PORT 1 VIEW  Result Date: 12/03/2018 CLINICAL DATA:  Shortness of breath EXAM: PORTABLE CHEST 1 VIEW COMPARISON:  11/27/2018 FINDINGS: Heart is borderline in size. No confluent airspace opacities, effusions or edema. No acute bony abnormality. Aortic atherosclerosis. IMPRESSION: Borderline heart size.  No active disease. Electronically Signed   By: Rolm Baptise M.D.   On: 12/03/2018 21:13    Microbiology: Recent Results (from the past 240 hour(s))  Respiratory Panel by RT PCR (Flu A&B, Covid) - Nasopharyngeal Swab     Status: None   Collection Time: 12/30/18 12:15 PM   Specimen: Nasopharyngeal Swab  Result Value Ref Range Status   SARS Coronavirus 2 by RT PCR NEGATIVE NEGATIVE Final    Comment: (NOTE) SARS-CoV-2  target nucleic acids are NOT DETECTED. The SARS-CoV-2 RNA is generally detectable in upper respiratoy specimens during the acute phase of infection. The lowest concentration of SARS-CoV-2 viral copies this assay can detect is 131 copies/mL. A negative result does not preclude SARS-Cov-2 infection and should not be used as the sole basis for treatment or other patient management decisions. A negative result may occur with  improper specimen collection/handling, submission of specimen other than nasopharyngeal swab, presence of viral mutation(s) within the areas targeted by this assay, and inadequate number of viral copies (<131 copies/mL). A negative result must be combined with clinical observations, patient history, and epidemiological information. The expected result is Negative. Fact Sheet for Patients:  PinkCheek.be Fact Sheet for Healthcare Providers:   GravelBags.it This test is not yet ap proved or cleared by the [place] FDA and  has been authorized for detection and/or diagnosis of SARS-CoV-2 by FDA under an Emergency Use Authorization (EUA). This EUA will remain  in effect (meaning this test can be used) for the duration of the COVID-19 declaration under Section 564(b)(1) of the Act, 21 U.S.C. section 360bbb-3(b)(1), unless the authorization is terminated or revoked sooner.    Influenza A by PCR NEGATIVE NEGATIVE Final   Influenza B by PCR NEGATIVE NEGATIVE Final    Comment: (NOTE) The Xpert Xpress SARS-CoV-2/FLU/RSV assay is intended as an aid in  the diagnosis of influenza from Nasopharyngeal swab specimens and  should not be used as a sole basis for treatment. Nasal washings and  aspirates are unacceptable for Xpert Xpress SARS-CoV-2/FLU/RSV  testing. Fact Sheet for Patients: PinkCheek.be Fact Sheet for Healthcare Providers: GravelBags.it This test is not yet approved or cleared by the [place] FDA and  has been authorized for detection and/or diagnosis of SARS-CoV-2 by  FDA under an Emergency Use Authorization (EUA). This EUA will remain  in effect (meaning this test can be used) for the duration of the  Covid-19 declaration under Section 564(b)(1) of the Act, 21  U.S.C. section 360bbb-3(b)(1), unless the authorization is  terminated or revoked. Performed at [facility], [place] [address] [place], [place] [address]      Labs: Basic Metabolic Panel: Recent Labs  Lab 12/30/18 1148 12/30/18 1321 12/31/18 0332  NA 145 145 145  K 4.6 4.2 5.1  CL 100  --  100  CO2 30  --  28  GLUCOSE 223*  --  278*  BUN 39*  --  56*  CREATININE 1.22*  --  1.58*  CALCIUM 11.9*  --  11.8*  MG 2.1  --   --   PHOS 3.8  --   --    Liver Function Tests: Recent Labs  Lab 12/31/18 0332  AST 22  ALT 22  ALKPHOS 84  BILITOT 0.7   PROT 6.7  ALBUMIN 3.3*   No results for input(s): LIPASE, AMYLASE in the last 168 hours. No results for input(s): AMMONIA in the last 168 hours. CBC: Recent Labs  Lab 12/30/18 1148 12/30/18 1321 12/31/18 0332  WBC 6.5  --  5.1  NEUTROABS 5.1  --   --   HGB 14.7 14.3 13.3  HCT 47.4* 42.0 41.9  MCV 85.9  --  83.8  PLT 217  --  248   Cardiac Enzymes: No results for input(s): CKTOTAL, CKMB, CKMBINDEX, TROPONINI in the last 168 hours. BNP: BNP (last 3 results) Recent Labs    10/28/18 2044 12/30/18 1148 12/31/18 0332  BNP 1,586.2* 908.4* 785.1*  ProBNP (last 3 results) Recent Labs    03/01/18 1218 05/02/18 1249 06/24/18 1111  PROBNP 1,336.0* 910.0* 840.0*    CBG: Recent Labs  Lab 12/30/18 1742 12/30/18 2120 12/31/18 0741 12/31/18 1110  GLUCAP 211* 251* 243* 298*       Signed:  Radene Gunning NP  [facility] 12/31/2018, 11:30 AM

## 2018-12-31 NOTE — TOC Transition Note (Addendum)
Transition of Care [facility]) - CM/SW Discharge Note   Patient Details  Name: Denise [name] MRN: [id number] Date of Birth: 1945/12/06  Transition of Care [facility]) CM/SW Contact:  Sharin Mons, RN Phone Number: 12/31/2018, 12:12 PM   Clinical Narrative:    Presented with Acute on chronic respiratory failure with hypoxia. Pt will transition to home with husband today. Declined to return to SNF. Pt selected [facility] for home health services after choice given. Referral made with [facility] and accepted, [facility] Fri. Or Sat., pt and husband aware.Husband to provide transportation to home. No DME needs identified.  12/31/2018 _0  Husband / pt felt it would be best to have non-emergent ambulance for transportation to home. NCM called [facility] and arranged transportation for home.  12/31/2018 Outpatient palliative referral made with [facility] per NCM.  Final next level of care: [place] Barriers to Discharge: No Barriers Identified   Patient Goals and CMS Choice        Discharge Placement                       Discharge Plan and Services    HH Arranged: RN, PT, Nurse's Aide, Respirator Therapy, OT [place] Agency: [facility] (formerly [employer]) Date [place]: 12/31/18 Time [place]: 1211 Representative spoke with at [place]: [place] ([place]) Interventions     Readmission Risk Interventions No flowsheet data found.

## 2018-12-31 NOTE — Care Management Obs Status (Signed)
[facility] NOTIFICATION   Patient Details  Name: KIMYA MCCAHILL MRN: [id number] Date of Birth: [birth year]   Medicare Observation Status Notification Given:  Yes    Sharin Mons, RN 12/31/2018, 12:28 PM

## 2018-12-31 NOTE — Telephone Encounter (Signed)
Denise with [facility] left a voicemail stating that patient is being discharged from [place] stated that they are have been requested to provide palliative care for the patient at home. Langley Gauss stated that she can take the verbal order by phone if [name] NP is in agreement with it.  When calling back with verbal orders press opt 2.

## 2018-12-31 NOTE — Telephone Encounter (Signed)
Okay to given verbal orders as requested for palliative care

## 2018-12-31 NOTE — Evaluation (Signed)
Physical Therapy Evaluation Patient Details Name: Denise [name] MRN: [id number] DOB: Aug 02, 1945 Today's Date: 12/31/2018   History of Present Illness   Denise [name] is a 73 y.o. female with medical history significant of diabetes mellitus type 2 diet-controlled, hypertension, chronic respiratory failure secondary to COPD on 4 L O2, pulmonary hypertension recently hospitalized (11/27/2018-12/06/2018), with ischemic right thalamic stroke, multiple territory stroke with recent ischemic stroke status post TPA, currently on anticoagulation with Eliquis, who presents to ED with a 4-day history of worsening shortness of breath.   Clinical Impression  Pt admitted with above. Pt very weak, anxious re: falling, oriented however demonstrates impaired cognition in regard to processing and sequencing, and requiring maxA for all mobility. Pt was at [facility] SNF however per chart pt's spouse wants to take her home from here as "I can provide better care for her than there." At this time pt is requiring maxA unsure of spouse or other family can provide maxA. Pt also at 83% on RA, Pt placed on 2Lo2 via [place] and with in 3 min SpO2 inc to 90%. Suspect pt will need to go home on O2 as well. Acute PT to cont to follow.    Follow Up Recommendations SNF;Supervision/Assistance - 24 hour(however spouse wants to take her home)- will need HHPT if spouse takes her home    Equipment Recommendations  Rolling walker with 5" wheels(youth) , pt may need w/c for safe mobility as pt unable to amb today   Recommendations for Other Services       Precautions / Restrictions Precautions Precautions: Fall Precaution Comments: anxious re: falling Restrictions Weight Bearing Restrictions: No      Mobility  Bed Mobility Overal bed mobility: Needs Assistance Bed Mobility: Sidelying to Sit   Sidelying to sit: Mod assist;Max assist       General bed mobility comments: pt in sidelying upon PT arrival, max directional  verbal and tactile cues to complete task, mod/maxA for trunk elevation and to then steady into midline EOB balance  Transfers Overall transfer level: Needs assistance Equipment used: 1 person hand held assist Transfers: Sit to/from [employer] Transfers Sit to Stand: Max assist Stand pivot transfers: Max assist       General transfer comment: pt with request to use bathroom stating "it's already coming" pt with fear of falling asking for help and resisting transfer but yet wants to go to bathroom. max verbal cues to sequence and physical assist to complete std pvt to [facility] and then to chair  Ambulation/Gait             General Gait Details: pt unable to amb today  Stairs            Wheelchair Mobility    Modified Rankin (Stroke Patients Only) Modified Rankin (Stroke Patients Only) Pre-Morbid Rankin Score: Moderately severe disability Modified Rankin: Moderately severe disability     Balance Overall balance assessment: Needs assistance Sitting-balance support: Feet supported;Bilateral upper extremity supported Sitting balance-Leahy Scale: Poor Sitting balance - Comments: requires close min guard to maintain balance, pt with anterior lean   Standing balance support: During functional activity Standing balance-Leahy Scale: Poor Standing balance comment: pt dependent on physical assist to maintain standing                             Pertinent Vitals/Pain Pain Assessment: No/denies pain    Home Living Family/patient expects to be discharged to:: Private residence Living Arrangements:  Spouse/significant other Available Help at Discharge: Family;Available 24 hours/day Type of Home: House Home Access: Stairs to enter Entrance Stairs-Rails: Psychiatric nurse of Steps: 4 Home Layout: One level Home Equipment: Walker - 2 wheels;Tub bench Additional Comments: pt was at [facility] SNF s/p d/c from hospital on 12/4 for rehab s/p stroke however  spouse now wants to take her home because he can care for her betetr there    Prior Function Level of [place]: Needs assistance   Gait / Transfers Assistance Needed: pt reports walking with RW with rehab at SNF  ADL's / Homemaking Assistance Needed: assist with all ADLs  Comments: pt undergoing rehab at SNF     Hand Dominance   Dominant Hand: Right    Extremity/Trunk Assessment   Upper Extremity Assessment Upper Extremity Assessment: Generalized weakness    Lower Extremity Assessment Lower Extremity Assessment: Generalized weakness    Cervical / Trunk Assessment Cervical / Trunk Assessment: Kyphotic  Communication   Communication: No difficulties  Cognition Arousal/Alertness: Awake/alert Behavior During Therapy: Anxious Overall Cognitive Status: No family/caregiver present to determine baseline cognitive functioning                                 General Comments: pt able to follow commands, however very delayed, increased time for sequencing/problem solving, pt with noted short term memory deficits      General Comments General comments (skin integrity, edema, etc.): pt assisted to [facility], pt with bowel movement, SpO2 at 83% on RA    Exercises     Assessment/Plan    PT Assessment Patient needs continued PT services  PT Problem List Decreased strength;Decreased activity tolerance;Decreased balance;Decreased mobility;Decreased coordination;Decreased cognition;Decreased knowledge of use of DME;Decreased safety awareness       PT Treatment Interventions DME instruction;Gait training;Stair training;Functional mobility training;Therapeutic activities;Therapeutic exercise;Balance training;Neuromuscular re-education;Cognitive remediation    PT Goals (Current goals can be found in the Care Plan section)  Acute Rehab PT Goals PT Goal Formulation: With patient Time For Goal Achievement: 01/14/19 Potential to Achieve Goals: Good    Frequency Min  3X/week   Barriers to discharge   unsure how much physical assist pts spouse/family can provide    Co-evaluation               AM-PAC PT "6 Clicks" Mobility  Outcome Measure Help needed turning from your back to your side while in a flat bed without using bedrails?: A Lot Help needed moving from lying on your back to sitting on the side of a flat bed without using bedrails?: A Lot Help needed moving to and from a bed to a chair (including a wheelchair)?: A Lot Help needed standing up from a chair using your arms (e.g., wheelchair or bedside chair)?: A Lot Help needed to walk in hospital room?: Total Help needed climbing 3-5 steps with a railing? : Total 6 Click Score: 10    End of Session Equipment Utilized During Treatment: Gait belt Activity Tolerance: Patient limited by fatigue Patient left: in chair;with call bell/phone within reach;with nursing/sitter in room Nurse Communication: Mobility status(Spo2 saturation) PT Visit Diagnosis: Unsteadiness on feet (R26.81);Difficulty in walking, not elsewhere classified (R26.2)    Time: 0711-0739 PT Time Calculation (min) (ACUTE ONLY): 28 min   Charges:   PT Evaluation $PT Eval Moderate Complexity: 1 Mod PT Treatments $Therapeutic Activity: 8-22 mins        Kittie Plater, PT, DPT Acute Rehabilitation Services  Pager #: [phone] Office #: [phone]   [place] 12/31/2018, 10:26 AM

## 2018-12-31 NOTE — Evaluation (Addendum)
Occupational Therapy Evaluation Patient Details Name: Denise [name] MRN: [id number] DOB: [birth year] Today's Date: 12/31/2018    History of Present Illness  Denise [name] is a 73 y.o. female with medical history significant of diabetes mellitus type 2 diet-controlled, hypertension, chronic respiratory failure secondary to COPD on 4 L O2, pulmonary hypertension recently hospitalized (11/27/2018-12/06/2018), with ischemic right thalamic stroke, multiple territory stroke with recent ischemic stroke status post TPA, currently on anticoagulation with Eliquis, who presents to ED with a 4-day history of worsening shortness of breath.    Clinical Impression   Patient admitted from SNF for above, limited by problem list below, including generalized weakness (L>R), impaired balance, decreased activity tolerance, impaired cognition.  She reports ability to complete UB ADLs, but needing assist with LB ADLs prior to admission.  Currently requires min assist for UB ADLs, max-total assist for LB ADLs, min assist for toileting, and declined transfers today. Patient eager for dc home today, plans to have 24/7 support from spouse. Believe patient will benefit from continued OT services after dc at [facility] level to optimize independence with ADLs, mobility. Will sign off acutely, as pt is discharging home today.     Follow Up Recommendations  Home health OT;Supervision/Assistance - 24 hour;Other (comment)(HH aide )    Equipment Recommendations  3 in 1 bedside commode    Recommendations for Other Services       Precautions / Restrictions Precautions Precautions: Fall Restrictions Weight Bearing Restrictions: No      Mobility Bed Mobility               General bed mobility comments: OOB in recliner upon entry   Transfers                 General transfer comment: pt declined    Balance Overall balance assessment: Needs assistance Sitting-balance support: No upper extremity  supported;Feet supported Sitting balance-Leahy Scale: Fair Sitting balance - Comments: min guard for dynamic sitting balance                                   ADL either performed or assessed with clinical judgement   ADL Overall ADL's : Needs assistance/impaired     Grooming: Sitting;Minimal assistance   Upper Body Bathing: Minimal assistance;Sitting   Lower Body Bathing: Maximal assistance;Sitting/lateral leans   Upper Body Dressing : Minimal assistance;Sitting   Lower Body Dressing: Total assistance;Sitting/lateral leans     Toilet Transfer Details (indicate cue type and reason): deferred, pt declined           General ADL Comments: pt limited by weakness, impaired balance, decreased activity tolerance     Vision   Vision Assessment?: No apparent visual deficits     Perception     Praxis      Pertinent Vitals/Pain Pain Assessment: No/denies pain     Hand Dominance Right   Extremity/Trunk Assessment Upper Extremity Assessment Upper Extremity Assessment: Generalized weakness(L UE 3-/5 weaker than R UE 3+/5)   Lower Extremity Assessment Lower Extremity Assessment: Defer to PT evaluation   Cervical / Trunk Assessment Cervical / Trunk Assessment: Kyphotic   Communication Communication Communication: No difficulties   Cognition Arousal/Alertness: Awake/alert Behavior During Therapy: Anxious Overall Cognitive Status: No family/caregiver present to determine baseline cognitive functioning  General Comments: pt able to follow commands with increased time, noted deficits in STM, awareness and problem sovling    General Comments  pt on 4L via [place]    Exercises     Shoulder Instructions      Home Living Family/patient expects to be discharged to:: Private residence Living Arrangements: Spouse/significant other Available Help at Discharge: Family;Available 24 hours/day Type of Home: House Home  Access: Stairs to enter [employer] of Steps: 4 Entrance Stairs-Rails: Right;Left Home Layout: One level     Bathroom Shower/Tub: Walk-in Hydrologist: Standard Bathroom Accessibility: Yes   Home Equipment: Environmental consultant - 2 wheels;Tub bench   Additional Comments: pt was at [facility] SNF s/p d/c from hospital on 12/4 for rehab s/p stroke however spouse now wants to take her home because he can care for her betetr there      Prior Functioning/Environment Level of Independence: Needs assistance  Gait / Transfers Assistance Needed: pt reports walking with RW with rehab at SNF ADL's / Homemaking Assistance Needed: able to complete UB ADLs with setup, assist for LB ADLs    Comments: pt undergoing rehab at SNF        OT Problem List: Decreased strength;Decreased range of motion;Decreased activity tolerance;Impaired balance (sitting and/or standing);Decreased coordination;Decreased cognition;Decreased knowledge of precautions;Impaired UE functional use;Cardiopulmonary status limiting activity      OT Treatment/Interventions: Self-care/ADL training;Therapeutic exercise;DME and/or AE instruction;Therapeutic activities;Patient/family education;Balance training    OT Goals(Current goals can be found in the care plan section) Acute Rehab OT Goals Patient Stated Goal: to get home  OT Goal Formulation: With patient  OT Frequency: Min 2X/week   Barriers to D/C:            Co-evaluation              AM-PAC OT "6 Clicks" Daily Activity     Outcome Measure Help from another person eating meals?: A Little Help from another person taking care of personal grooming?: A Little Help from another person toileting, which includes using toliet, bedpan, or urinal?: A Lot Help from another person bathing (including washing, rinsing, drying)?: A Lot Help from another person to put on and taking off regular upper body clothing?: A Little Help from another person to put on  and taking off regular lower body clothing?: A Lot 6 Click Score: 15   End of Session Equipment Utilized During Treatment: Oxygen(4L) Nurse Communication: Mobility status  Activity Tolerance: Patient tolerated treatment well Patient left: in chair;with call bell/phone within reach  OT Visit Diagnosis: Other abnormalities of gait and mobility (R26.89);Muscle weakness (generalized) (M62.81);Other symptoms and signs involving cognitive function                Time: 1437-1450 OT Time Calculation (min): 13 min Charges:  OT General Charges $OT Visit: 1 Visit OT Evaluation $OT Eval Moderate Complexity: 1 Mod  Jolaine Artist, OT Acute Rehabilitation Services Pager [phone] Office [phone]   Delight Stare 12/31/2018, 3:58 PM

## 2018-12-31 NOTE — Care Management CC44 (Signed)
Condition Code 44 Documentation Completed  Patient Details  Name: Denise [name] MRN: [id number] Date of Birth: 1945/07/20   Condition Code 44 given:  Yes Patient signature on Condition Code 44 notice:  Yes Documentation of 2 MD's agreement:  Yes Code 44 added to claim:  Yes    Sharin Mons, RN 12/31/2018, 12:28 PM

## 2018-12-31 NOTE — Progress Notes (Signed)
Chaplain visited as a result of a referral.  The patient stated they could not see well and talked about how she prayed often.  The chaplain offered prayer.  The chaplain is available if a follow-up is needed.  Brion Aliment Chaplain Resident For questions concerning this note please contact me by pager [phone]

## 2019-01-01 ENCOUNTER — Other Ambulatory Visit: Payer: Self-pay

## 2019-01-01 ENCOUNTER — Other Ambulatory Visit (HOSPITAL_COMMUNITY): Payer: Self-pay

## 2019-01-01 ENCOUNTER — Telehealth: Payer: Self-pay | Admitting: *Deleted

## 2019-01-01 NOTE — Progress Notes (Signed)
Paramedicine Encounter    Patient ID: Denise [name], female    DOB: 1945/10/22, 73 y.o.   MRN: [id number]    Patient Care Team: [name], NP as PCP - General (Internal Medicine) [name], MD as Consulting Physician (Cardiology) Florance, Tomasa Blase, RN as [place] Management  Patient Active Problem List   Diagnosis Date Noted  . Acute on chronic respiratory failure with hypoxia ([place]) 12/30/2018  . Acute on chronic diastolic CHF (congestive heart failure) ([place]) 12/30/2018  . Acute ischemic right MCA stroke ([place]) 11/27/2018  . Vaginal candidiasis   . Acute blood loss anemia   . Supplemental oxygen dependent   . Uncontrolled type 2 diabetes mellitus with hyperglycemia ([place])   . ICH (intracerebral hemorrhage) ([place]) s/p tPA administration 11/08/2018  . Hypertensive urgency 11/08/2018  . Cerebellar stroke, acute ([place]) 11/08/2018  . History of ischemic stroke 11/04/2018  . COPD with acute exacerbation ([place]) 10/28/2018  . Acute on chronic systolic CHF (congestive heart failure) ([place]) 10/28/2018  . Elevated troponin 10/28/2018  . Lactic acidosis 10/28/2018  . GERD (gastroesophageal reflux disease) 10/28/2018  . Acute on chronic respiratory failure with hypercapnia ([place]) 10/28/2018  . Diabetes mellitus without complication-diet controled 10/28/2018  . On home oxygen therapy 07/12/2018  . CHF (congestive heart failure) ([place]) 05/16/2018  . Chronic respiratory failure with hypoxia and hypercapnia ([place]) 05/03/2018  . Borderline abnormal TFTs 05/02/2018  . Type 2 diabetes mellitus ([place]) 04/26/2018  . Unintended weight loss 04/26/2018  . Lower extremity edema 02/20/2018  . Chronic respiratory failure with hypoxia ([place]) 10/02/2017  . Hematuria 09/27/2017  . Pulmonary hypertension ([name]) c/w cor pulmonale   . Protein-calorie malnutrition, severe 09/17/2017  . Constipation   . Hyperparathyroidism, primary ([place]) 07/27/2016  . Loss of appetite  06/02/2016  . COPD GOLD  II  spirometry, severe Emphysema and 02 dep 11/22/2015  . Vitamin D deficiency 11/22/2015  . Anxiety and depression 11/22/2015  . Insomnia 11/22/2015  . Hyperlipidemia 11/22/2015  . Essential hypertension 11/22/2015  . Hx of colonic polyps 02/10/2013  . History of colon cancer 06/11/1986    Current Outpatient Medications:  .  apixaban (ELIQUIS) 5 MG TABS tablet, Take 1 tablet (5 mg total) by mouth 2 (two) times daily. (Patient taking differently: Take 5 mg by mouth 2 (two) times daily with a meal. ), Disp: 60 tablet, Rfl: 0 .  atorvastatin (LIPITOR) 20 MG tablet, Take 1 tablet (20 mg total) by mouth every evening. For cholesterol (Patient taking differently: Take 20 mg by mouth at bedtime. For cholesterol), Disp: 90 tablet, Rfl: 3 .  furosemide (LASIX) 40 MG tablet, Take 40 mg by mouth daily. , Disp: , Rfl:  .  metoprolol tartrate (LOPRESSOR) 25 MG tablet, TAKE 0.5 TABLETS (12.5 MG TOTAL) BY MOUTH 2 (TWO) TIMES DAILY. (Patient taking differently: Take 12.5 mg by mouth 2 (two) times daily with a meal. ), Disp: 90 tablet, Rfl: 0 .  mirtazapine (REMERON) 15 MG tablet, TAKE 1 TABLET BY MOUTH AT BEDTIME. FOR DEPRESSION AND APPETITE. (Patient taking differently: Take 15 mg by mouth at bedtime. ), Disp: 90 tablet, Rfl: 3 .  predniSONE (DELTASONE) 10 MG tablet, Take 4 tablets (40 mg total) by mouth daily for 4 days., Disp: 16 tablet, Rfl: 0 .  spironolactone (ALDACTONE) 25 MG tablet, Take 1 tablet (25 mg total) by mouth daily. Take at night (Patient taking differently: Take 25 mg by mouth daily. ), Disp: 30 tablet, Rfl: 0 .  albuterol (  VENTOLIN HFA) 108 (90 Base) MCG/ACT inhaler, INHALE 1-2 PUFFS INTO THE LUNGS EVERY 4 HOURS AS NEEDED FOR WHEEZING OR SHORTNESS OF BREATH. (Patient taking differently: Inhale 1-2 puffs into the lungs every 4 (four) hours as needed for wheezing. ), Disp: 6.7 g, Rfl: 5 .  Blood Glucose Calibration (ACCU-CHEK AVIVA) SOLN, USE AS INSTRUCTED TO TEST  BLOOD SUGAR DAILY (Patient taking differently: 1 each by Other route daily. ), Disp: 1 each, Rfl: 2 .  Blood Glucose Monitoring Suppl (ACCU-CHEK AVIVA PLUS) w/Device KIT, Use as instructed to test blood sugar up to 3 times daily (Patient taking differently: 1 each by Other route 3 (three) times daily. ), Disp: 1 kit, Rfl: 0 .  fluticasone furoate-vilanterol (BREO ELLIPTA) 100-25 MCG/INH AEPB, Inhale 1 puff into the lungs daily., Disp: , Rfl:  .  glucose blood (ACCU-CHEK AVIVA PLUS) test strip, Use as instructed to test blood sugar up to 3 times daily, Disp: 300 each, Rfl: 1 .  Lancets (ACCU-CHEK SOFT TOUCH) lancets, Use as instructed to test blood sugar up to 3 times daily (Patient taking differently: 1 each by Other route 3 (three) times daily. ), Disp: 300 each, Rfl: 1 .  OXYGEN, Inhale 4 L into the lungs continuous. , Disp: , Rfl:  .  potassium chloride (KLOR-CON M10) 10 MEQ tablet, Take 1 tablet (10 mEq total) by mouth daily., Disp: 30 tablet, Rfl: 0 .  umeclidinium bromide (INCRUSE ELLIPTA) 62.5 MCG/INH AEPB, Inhale 1 puff into the lungs daily., Disp:  , Rfl:  No Known Allergies   Social History   Socioeconomic History  . Marital status: Married    Spouse name: Not on file  . Number of children: 3  . Years of education: Not on file  . Highest education level: Not on file  Occupational History  . Not on file  Tobacco Use  . Smoking status: Former Smoker    Packs/day: 1.00    Years: 30.00    Pack years: 30.00    Types: Cigarettes    Quit date: 1989    Years since quitting: 32.0  . Smokeless tobacco: Never Used  Substance and Sexual Activity  . Alcohol use: No  . Drug use: No  . Sexual activity: Not Currently    Partners: Male    Birth control/protection: Post-menopausal, Surgical  Other Topics Concern  . Not on file  Social History Narrative   Married. 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 Difficulty of Paying Living Expenses: Not on file  Food Insecurity:   . Worried About Charity fundraiser in the Last Year: Not on file  . Ran Out of Food in the Last Year: Not on file  Transportation Needs:   . Lack of Transportation (Medical): Not on file  . Lack of Transportation (Non-Medical): Not on file  Physical Activity:   . Days of Exercise per Week: Not on file  . Minutes of Exercise per Session: Not on file  Stress:   . Feeling of Stress : Not on file  Social Connections:   . Frequency of Communication with Friends and Family: Not on file  . Frequency of Social Gatherings with Friends and Family: Not on file  . Attends Religious Services: Not on file  . Active Member of Clubs or  Organizations: Not on file  . Attends Archivist Meetings: Not on file  . Marital Status: Not on file  Intimate Partner Violence:   . Fear of Current or Ex-Partner: Not on file  . Emotionally Abused: Not on file  . Physically Abused: Not on file  . Sexually Abused: Not on file    Physical Exam Pulmonary:     Effort: No respiratory distress.     Breath sounds: No wheezing or rales.  Abdominal:     General: There is no distension.  Musculoskeletal:        General: No swelling.     Right lower leg: No edema.     Left lower leg: No edema.  Skin:    General: Skin is warm and dry.         Future Appointments  Date Time Provider [place]  05/26/2019 10:00 AM Tanda Rockers, MD [facility] None     BP 98/70 (BP Location: Right Arm, Patient Position: Sitting, Cuff Size: Normal)   Pulse 74   Temp 98.6 F (37 C)   SpO2 93%    ATF pt CAO to her norm although very weak sitting on the side of the bed.  She came home yesterday from NH.  Her husband said that he wanted her to come home due to the level of care she wasn't getting in the facility.  He said that he has an appointment tomorrow with an  home care agency.  He's aware that he will need assistance in the home for Mrs. Marvel Plan.  Her sister is helping him now.  [name] pill box filled according to the discharge instructions.  [name] vitals noted. She hasn't weighed today due to her weakness.  No edema noted to her lower extremities/ no swelling noted to her abdomen.     I called Dr. Carlis Abbott to verify pt being taken off of ASA.  I left a message for her RN to call back to advice.  ASA was left out of the pill box unitl further notice.  Eliquis was added to her medication list but the pharmacy said that there wasn't an prescription for eliquis on her profile.    Medication ordered: No eliquis prescription ready for pick up  [place], EMT Paramedic [phone] 01/01/2019    ACTION: Home visit completed

## 2019-01-01 NOTE — Patient Outreach (Signed)
[facility]) Care Management  01/01/2019  Denise [name] August 25, 1945 [id number]     Transition of Care Referral  Referral Date: 01/01/2019 Referral Source: [facility] Discharge Report Date of Admission: 12/30/2018 Diagnosis: acute resp failure Date of Discharge: 12/31/2018 Facility: [facility] Medicare    Outreach attempt # 1 to patient. No answer at present. RN CM left HIPAA compliant voicemail message along with contact info.     Plan: RN CM will make outreach attempt to patient within 3-4 business days. RN CM will send unsuccessful outreach letter to patient.   Enzo Montgomery, RN,BSN,CCM [place] Management Telephonic Care Management Coordinator Direct Phone: [phone] Toll Free: [phone] Fax: [phone]

## 2019-01-01 NOTE — Telephone Encounter (Signed)
Demetria with Cardiology left a voicemail stating that she is at patient's house doing her pill box and notices some medication changes. Demetria requested a call back.  Called Demetria back and got her voicemail. Advised her to call the office back.

## 2019-01-01 NOTE — Telephone Encounter (Signed)
Gave the approval for the verbal orders 

## 2019-01-02 ENCOUNTER — Other Ambulatory Visit: Payer: Self-pay | Admitting: Primary Care

## 2019-01-02 ENCOUNTER — Other Ambulatory Visit: Payer: Self-pay

## 2019-01-02 DIAGNOSIS — J9611 Chronic respiratory failure with hypoxia: Secondary | ICD-10-CM | POA: Diagnosis not present

## 2019-01-02 DIAGNOSIS — N3000 Acute cystitis without hematuria: Secondary | ICD-10-CM

## 2019-01-02 LAB — URINE CULTURE: Culture: >=100000 — AB

## 2019-01-02 MED ORDER — CEPHALEXIN 250 MG PO CAPS: 250.0000 mg | ORAL_CAPSULE | Freq: Two times a day (BID) | ORAL | 0 refills | Status: AC

## 2019-01-02 NOTE — Patient Outreach (Signed)
[facility]) Care Management  01/02/2019  Denise [name] [birth year] [id number]   Transition of Care Referral  Referral Date: 01/01/2019 Referral Source: [facility] Discharge Report Date of Admission: 12/30/2018 Diagnosis: acute resp failure Date of Discharge: 12/31/2018 Facility: [facility] Medicare    Outreach attempt #2 to patient. Spoke with both patient and spouse. They report that things are going well since patient returned home.They both deny any acute issues or concerns at present. Spouse report that [facility] agency has contacted them and home visit arranged. Patient also active with paramedicine team and EMT was out yesterday to see patient and reviewed meds. They did not wish to review meds again. Spouse reports that he plans to call PCP office today to make MD appt. He confirms that they have transportation to get patient to appt. He reports that patient's upper dentures were not returned to her at discharge and he has already spoken with someone regarding that to see if they can locate them. Otherwise, he voices they have no issues or concerns. Patient has multiple agencies/services involved in her care and they do not feel like they need any more services/calls at this time but was appreciative of phone call. They are aware that they can call for any future needs or concerns.     Plan: RN CM close case at this time.   Enzo Montgomery, RN,BSN,CCM [place] Management Telephonic Care Management Coordinator Direct Phone: [phone] Toll Free: [phone] Fax: [phone]

## 2019-01-05 DIAGNOSIS — F419 Anxiety disorder, unspecified: Secondary | ICD-10-CM | POA: Diagnosis not present

## 2019-01-05 DIAGNOSIS — E119 Type 2 diabetes mellitus without complications: Secondary | ICD-10-CM | POA: Diagnosis not present

## 2019-01-05 DIAGNOSIS — J9611 Chronic respiratory failure with hypoxia: Secondary | ICD-10-CM | POA: Diagnosis not present

## 2019-01-05 DIAGNOSIS — J9622 Acute and chronic respiratory failure with hypercapnia: Secondary | ICD-10-CM | POA: Diagnosis not present

## 2019-01-05 DIAGNOSIS — I5043 Acute on chronic combined systolic (congestive) and diastolic (congestive) heart failure: Secondary | ICD-10-CM | POA: Diagnosis not present

## 2019-01-05 DIAGNOSIS — I272 Pulmonary hypertension, unspecified: Secondary | ICD-10-CM | POA: Diagnosis not present

## 2019-01-05 DIAGNOSIS — I69354 Hemiplegia and hemiparesis following cerebral infarction affecting left non-dominant side: Secondary | ICD-10-CM | POA: Diagnosis not present

## 2019-01-05 DIAGNOSIS — J439 Emphysema, unspecified: Secondary | ICD-10-CM | POA: Diagnosis not present

## 2019-01-05 DIAGNOSIS — I110 Hypertensive heart disease with heart failure: Secondary | ICD-10-CM | POA: Diagnosis not present

## 2019-01-06 ENCOUNTER — Telehealth: Payer: Self-pay | Admitting: *Deleted

## 2019-01-06 ENCOUNTER — Telehealth: Payer: Self-pay

## 2019-01-06 DIAGNOSIS — J9611 Chronic respiratory failure with hypoxia: Secondary | ICD-10-CM | POA: Diagnosis not present

## 2019-01-06 DIAGNOSIS — I110 Hypertensive heart disease with heart failure: Secondary | ICD-10-CM | POA: Diagnosis not present

## 2019-01-06 DIAGNOSIS — J9622 Acute and chronic respiratory failure with hypercapnia: Secondary | ICD-10-CM | POA: Diagnosis not present

## 2019-01-06 DIAGNOSIS — I69354 Hemiplegia and hemiparesis following cerebral infarction affecting left non-dominant side: Secondary | ICD-10-CM | POA: Diagnosis not present

## 2019-01-06 DIAGNOSIS — J439 Emphysema, unspecified: Secondary | ICD-10-CM | POA: Diagnosis not present

## 2019-01-06 DIAGNOSIS — I5043 Acute on chronic combined systolic (congestive) and diastolic (congestive) heart failure: Secondary | ICD-10-CM | POA: Diagnosis not present

## 2019-01-06 DIAGNOSIS — I272 Pulmonary hypertension, unspecified: Secondary | ICD-10-CM | POA: Diagnosis not present

## 2019-01-06 DIAGNOSIS — E119 Type 2 diabetes mellitus without complications: Secondary | ICD-10-CM | POA: Diagnosis not present

## 2019-01-06 DIAGNOSIS — F419 Anxiety disorder, unspecified: Secondary | ICD-10-CM | POA: Diagnosis not present

## 2019-01-06 NOTE — Telephone Encounter (Signed)
Please notify Denise [name] that we have gone over all of her medications in great detail during every visit so I am not sure where the problem lies.  I do appreciate them helping them with medication management.  I need more information on blood sugar readings.. times glucose readings have been checked, other readings? How long have they been running this way?  Have them monitor blood sugars over the next 1-2 weeks.  They can check before any meal, 2 hours after any meal, or bedtime.  Have him call me with some readings at that time.  She is supposed to be taking glipizide for which I see has disappeared from her medication list.  Have Denise [name] check to see if they still have glipizide at home.

## 2019-01-06 NOTE — Telephone Encounter (Signed)
Anda Kraft PT with [facility] left v/m requesting verbal orders for [facility] PT 1 x a wk for 9 wks. Also request name of signing MD for Gentry Fitz NP.

## 2019-01-06 NOTE — Telephone Encounter (Signed)
Spoken to [place] and notified of Kate's comments

## 2019-01-06 NOTE — Telephone Encounter (Signed)
[place] with [facility] left a message requesting orders. Kenney Houseman stated that patient has a history of strokes. Kenney Houseman stated that they will be requesting nursing, PT and OT. Kenney Houseman stated that she would like an okay for nursing twice a week for one week and once a week for 4 weeks. Kenney Houseman stated that she will be working with patient on medical management of COPD. Diabetes, CHF and hypertension. Kenney Houseman stated that patient's blood sugar has been running 200-400 and wants to know if this needs to be addressed? Kenney Houseman stated that she wants to work with patient on her medications because her husband has a lot of questions about the medications. Kenney Houseman stated that she was advised that someone from Dr. Claris Gladden office is coming out to do her pill packs every week for her. Kenney Houseman stated that she feels that patient needs education on her medications.

## 2019-01-06 NOTE — Telephone Encounter (Signed)
Approved.  Eliezer Lofts, MD.

## 2019-01-06 NOTE — Telephone Encounter (Signed)
Message left for Anda Kraft to return my call.

## 2019-01-07 ENCOUNTER — Telehealth: Payer: Self-pay | Admitting: Primary Care

## 2019-01-07 ENCOUNTER — Other Ambulatory Visit: Payer: Self-pay

## 2019-01-07 MED ORDER — GLIPIZIDE 5 MG PO TABS: 5.0000 mg | ORAL_TABLET | Freq: Every day | ORAL | 1 refills | Status: DC

## 2019-01-07 NOTE — Telephone Encounter (Signed)
Patient's husband contacted the office and stated that they do not have a Glipizide prescription, and they are needing a new rx. They would like this sent in to [facility] on [address].

## 2019-01-07 NOTE — Telephone Encounter (Signed)
Spoken to patient's husband and inform that glipizide 5 mg have been sent to [facility] and for patient to take the whole tablet not half.  Also a form that was faxed that they requesting for Anda Kraft  to complete. Will place in Kate's inbox to review. Was told that sister will come pick up form

## 2019-01-07 NOTE — Telephone Encounter (Signed)
Patient's sister called back   She stated that the lawyer is needing the information faxed to the lawyers office Fax # [phone]

## 2019-01-07 NOTE — Telephone Encounter (Signed)
Anda Kraft PT has been notified that orders have been approved.

## 2019-01-07 NOTE — Telephone Encounter (Signed)
I reviewed the paper that is requesting to be signed and it is very vague. I spoke with both patient and her husband who explained that they are working on reorganizing their Will and need this signed for legal purposes.  The patient verbalized with me that she is competent to make legal medical and financial decisions independently.  I requested a letter from her lawyer with specific requests and additional information prior to my signature.  Patient's husband will get a letter.

## 2019-01-07 NOTE — Telephone Encounter (Signed)
Patient's sister,Lavern, called.  She spoke to patient's lawyer and the form that was sent regarding patient's state of mind needs to be faxed to her attorney.  Lavern didn't have fax number and will call back with the fax number.

## 2019-01-07 NOTE — Addendum Note (Signed)
Addended by: Jacqualin Combes on: 01/07/2019 11:12 AM   Modules accepted: Orders

## 2019-01-07 NOTE — Telephone Encounter (Signed)
I need to speak with patient directly regarding this form from her lawyer before I can sign. Please notify me when you have her on the phone.

## 2019-01-08 ENCOUNTER — Telehealth (HOSPITAL_COMMUNITY): Payer: Self-pay

## 2019-01-08 DIAGNOSIS — E119 Type 2 diabetes mellitus without complications: Secondary | ICD-10-CM | POA: Diagnosis not present

## 2019-01-08 DIAGNOSIS — I110 Hypertensive heart disease with heart failure: Secondary | ICD-10-CM | POA: Diagnosis not present

## 2019-01-08 DIAGNOSIS — J439 Emphysema, unspecified: Secondary | ICD-10-CM | POA: Diagnosis not present

## 2019-01-08 DIAGNOSIS — I272 Pulmonary hypertension, unspecified: Secondary | ICD-10-CM | POA: Diagnosis not present

## 2019-01-08 DIAGNOSIS — J9622 Acute and chronic respiratory failure with hypercapnia: Secondary | ICD-10-CM | POA: Diagnosis not present

## 2019-01-08 DIAGNOSIS — I69354 Hemiplegia and hemiparesis following cerebral infarction affecting left non-dominant side: Secondary | ICD-10-CM | POA: Diagnosis not present

## 2019-01-08 DIAGNOSIS — F419 Anxiety disorder, unspecified: Secondary | ICD-10-CM | POA: Diagnosis not present

## 2019-01-08 DIAGNOSIS — J9611 Chronic respiratory failure with hypoxia: Secondary | ICD-10-CM | POA: Diagnosis not present

## 2019-01-08 DIAGNOSIS — I5043 Acute on chronic combined systolic (congestive) and diastolic (congestive) heart failure: Secondary | ICD-10-CM | POA: Diagnosis not present

## 2019-01-08 NOTE — Telephone Encounter (Signed)
They Law office of [place] called in regards to the letter they are requesting.  Denise [name] stated that they are requesting a competency letter.  She stated that it needs to state that the patient is competent to sign legal documents. They have an appointment today at the lawyer's office @ 2:00 and without a letter from her provider stating she has the ability to sign the documents she will not be able to sign anything  Denise [name] provided a phone number for further questions Phone- [phone] Fax- [phone]

## 2019-01-08 NOTE — Telephone Encounter (Signed)
Faxed as requested

## 2019-01-08 NOTE — Telephone Encounter (Signed)
Called pt regarding home visit, no answer and I left a message for them to return my call.   Marylouise Stacks, EMT-Paramedic  01/08/19

## 2019-01-08 NOTE — Telephone Encounter (Signed)
Noted, form signed and placed in Chan's inbox.

## 2019-01-09 ENCOUNTER — Telehealth: Payer: Self-pay | Admitting: Internal Medicine

## 2019-01-09 ENCOUNTER — Other Ambulatory Visit (HOSPITAL_COMMUNITY): Payer: Self-pay

## 2019-01-09 ENCOUNTER — Telehealth (HOSPITAL_COMMUNITY): Payer: Self-pay

## 2019-01-09 NOTE — Progress Notes (Signed)
Paramedicine Encounter    Patient ID: Denise [name], female    DOB: 17-Apr-1945, 74 y.o.   MRN: [id number]   Patient Care Team: [name], NP as PCP - General (Internal Medicine) [name], MD as Consulting Physician (Cardiology)  Patient Active Problem List   Diagnosis Date Noted  . Acute on chronic respiratory failure with hypoxia ([place]) 12/30/2018  . Acute on chronic diastolic CHF (congestive heart failure) ([place]) 12/30/2018  . Acute ischemic right MCA stroke ([place]) 11/27/2018  . Vaginal candidiasis   . Acute blood loss anemia   . Supplemental oxygen dependent   . Uncontrolled type 2 diabetes mellitus with hyperglycemia ([place])   . ICH (intracerebral hemorrhage) ([place]) s/p tPA administration 11/08/2018  . Hypertensive urgency 11/08/2018  . Cerebellar stroke, acute ([place]) 11/08/2018  . History of ischemic stroke 11/04/2018  . COPD with acute exacerbation ([place]) 10/28/2018  . Acute on chronic systolic CHF (congestive heart failure) ([place]) 10/28/2018  . Elevated troponin 10/28/2018  . Lactic acidosis 10/28/2018  . GERD (gastroesophageal reflux disease) 10/28/2018  . Acute on chronic respiratory failure with hypercapnia ([place]) 10/28/2018  . Diabetes mellitus without complication-diet controled 10/28/2018  . On home oxygen therapy 07/12/2018  . CHF (congestive heart failure) ([place]) 05/16/2018  . Chronic respiratory failure with hypoxia and hypercapnia ([place]) 05/03/2018  . Borderline abnormal TFTs 05/02/2018  . Type 2 diabetes mellitus ([place]) 04/26/2018  . Unintended weight loss 04/26/2018  . Lower extremity edema 02/20/2018  . Chronic respiratory failure with hypoxia ([place]) 10/02/2017  . Hematuria 09/27/2017  . Pulmonary hypertension ([name]) c/w cor pulmonale   . Protein-calorie malnutrition, severe 09/17/2017  . Constipation   . Hyperparathyroidism, primary ([place]) 07/27/2016  . Loss of appetite 06/02/2016  . COPD GOLD  II  spirometry, severe Emphysema and 02 dep  11/22/2015  . Vitamin D deficiency 11/22/2015  . Anxiety and depression 11/22/2015  . Insomnia 11/22/2015  . Hyperlipidemia 11/22/2015  . Essential hypertension 11/22/2015  . Hx of colonic polyps 02/10/2013  . History of colon cancer 06/11/1986    Current Outpatient Medications:  .  albuterol (VENTOLIN HFA) 108 (90 Base) MCG/ACT inhaler, INHALE 1-2 PUFFS INTO THE LUNGS EVERY 4 HOURS AS NEEDED FOR WHEEZING OR SHORTNESS OF BREATH. (Patient taking differently: Inhale 1-2 puffs into the lungs every 4 (four) hours as needed for wheezing. ), Disp: 6.7 g, Rfl: 5 .  atorvastatin (LIPITOR) 20 MG tablet, Take 1 tablet (20 mg total) by mouth every evening. For cholesterol (Patient taking differently: Take 20 mg by mouth at bedtime. For cholesterol), Disp: 90 tablet, Rfl: 3 .  Blood Glucose Calibration (ACCU-CHEK AVIVA) SOLN, USE AS INSTRUCTED TO TEST BLOOD SUGAR DAILY (Patient taking differently: 1 each by Other route daily. ), Disp: 1 each, Rfl: 2 .  Blood Glucose Monitoring Suppl (ACCU-CHEK AVIVA PLUS) w/Device KIT, Use as instructed to test blood sugar up to 3 times daily (Patient taking differently: 1 each by Other route 3 (three) times daily. ), Disp: 1 kit, Rfl: 0 .  cephALEXin (KEFLEX) 250 MG capsule, Take 1 capsule (250 mg total) by mouth 2 (two) times daily for 7 days. For urinary tract infection., Disp: 14 capsule, Rfl: 0 .  fluticasone furoate-vilanterol (BREO ELLIPTA) 100-25 MCG/INH AEPB, Inhale 1 puff into the lungs daily., Disp: , Rfl:  .  furosemide (LASIX) 40 MG tablet, Take 40 mg by mouth daily. , Disp: , Rfl:  .  glucose blood (ACCU-CHEK AVIVA PLUS) test strip, Use as instructed to test blood  sugar up to 3 times daily, Disp: 300 each, Rfl: 1 .  Lancets (ACCU-CHEK SOFT TOUCH) lancets, Use as instructed to test blood sugar up to 3 times daily (Patient taking differently: 1 each by Other route 3 (three) times daily. ), Disp: 300 each, Rfl: 1 .  metoprolol tartrate (LOPRESSOR) 25 MG tablet,  TAKE 0.5 TABLETS (12.5 MG TOTAL) BY MOUTH 2 (TWO) TIMES DAILY. (Patient taking differently: Take 12.5 mg by mouth 2 (two) times daily with a meal. ), Disp: 90 tablet, Rfl: 0 .  mirtazapine (REMERON) 15 MG tablet, TAKE 1 TABLET BY MOUTH AT BEDTIME. FOR DEPRESSION AND APPETITE. (Patient taking differently: Take 15 mg by mouth at bedtime. ), Disp: 90 tablet, Rfl: 3 .  OXYGEN, Inhale 4 L into the lungs continuous. , Disp: , Rfl:  .  potassium chloride (KLOR-CON M10) 10 MEQ tablet, Take 1 tablet (10 mEq total) by mouth daily., Disp: 30 tablet, Rfl: 0 .  spironolactone (ALDACTONE) 25 MG tablet, Take 1 tablet (25 mg total) by mouth daily. Take at night (Patient taking differently: Take 25 mg by mouth daily. ), Disp: 30 tablet, Rfl: 0 .  umeclidinium bromide (INCRUSE ELLIPTA) 62.5 MCG/INH AEPB, Inhale 1 puff into the lungs daily., Disp:  , Rfl:  .  apixaban (ELIQUIS) 5 MG TABS tablet, Take 1 tablet (5 mg total) by mouth 2 (two) times daily. (Patient taking differently: Take 5 mg by mouth 2 (two) times daily with a meal. ), Disp: 60 tablet, Rfl: 0 .  glipiZIDE (GLUCOTROL) 5 MG tablet, Take 1 tablet (5 mg total) by mouth daily before breakfast., Disp: 90 tablet, Rfl: 1 No Known Allergies   Social History   Socioeconomic History  . Marital status: Married    Spouse name: Not on file  . Number of children: 3  . Years of education: Not on file  . Highest education level: Not on file  Occupational History  . Not on file  Tobacco Use  . Smoking status: Former Smoker    Packs/day: 1.00    Years: 30.00    Pack years: 30.00    Types: Cigarettes    Quit date: 1989    Years since quitting: 32.0  . Smokeless tobacco: Never Used  Substance and Sexual Activity  . Alcohol use: No  . Drug use: No  . Sexual activity: Not Currently    Partners: Male    Birth control/protection: Post-menopausal, Surgical  Other Topics Concern  . Not on file  Social History Narrative   Married. 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 Difficulty of Paying Living Expenses: Not on file  Food Insecurity:   . Worried About Charity fundraiser in the Last Year: Not on file  . Ran Out of Food in the Last Year: Not on file  Transportation Needs:   . Lack of Transportation (Medical): Not on file  . Lack of Transportation (Non-Medical): Not on file  Physical Activity:   . Days of Exercise per Week: Not on file  . Minutes of Exercise per Session: Not on file  Stress:   . Feeling of Stress : Not on file  Social Connections:   . Frequency of Communication with Friends and Family: Not on file  . Frequency of Social Gatherings with Friends and  Family: Not on file  . Attends Religious Services: Not on file  . Active Member of Clubs or Organizations: Not on file  . Attends Archivist Meetings: Not on file  . Marital Status: Not on file  Intimate Partner Violence:   . Fear of Current or Ex-Partner: Not on file  . Emotionally Abused: Not on file  . Physically Abused: Not on file  . Sexually Abused: Not on file    Physical Exam Vitals reviewed.  Constitutional:      Appearance: She is normal weight.  HENT:     Head: Normocephalic.     Nose: Nose normal.     Mouth/Throat:     Mouth: Mucous membranes are moist.     Pharynx: Oropharynx is clear.  Eyes:     Pupils: Pupils are equal, round, and reactive to light.  Cardiovascular:     Rate and Rhythm: Normal rate and regular rhythm.     Pulses: Normal pulses.     Heart sounds: Normal heart sounds.  Pulmonary:     Effort: Pulmonary effort is normal.     Breath sounds: Normal breath sounds.  Abdominal:     General: Abdomen is flat.     Palpations: Abdomen is soft.  Musculoskeletal:        General: Normal range of motion.     Cervical back: Normal range of motion.     Right lower leg: No edema.      Left lower leg: No edema.  Skin:    General: Skin is warm and dry.     Capillary Refill: Capillary refill takes less than 2 seconds.  Neurological:     Mental Status: She is alert. Mental status is at baseline.  Psychiatric:        Mood and Affect: Mood normal.    Arrived for home visit for [facility] who was seated in her chair in her bedroom. Denise [name] was alert and oriented breathing normally on her 4 lpm oxygen via nasal cannula. Vitals and assessment as noted. Bassheva reports feeling "pretty good". Medications were reviewed and confirmed. Pill box was filled accordingly. Patient's husband stated he would be picking up a new medication for her at [facility] (Glipizide) and that he would give it to her out of the bottle unil Denise [name] returns to fill pill box again. Home visit complete.   CBG- 161  Last weight- 115lbs    Future Appointments  Date Time Provider [place]  05/26/2019 10:00 AM Tanda Rockers, MD [facility] None     ACTION: Home visit completed

## 2019-01-09 NOTE — Telephone Encounter (Signed)
Called to schedule Palliative Consult, no answer - left message with reason for call along with my contact information.

## 2019-01-09 NOTE — Telephone Encounter (Signed)
Spoke to patient's husband and confirmed appointment for 2:00 this afternoon.

## 2019-01-13 ENCOUNTER — Telehealth: Payer: Self-pay | Admitting: Primary Care

## 2019-01-13 DIAGNOSIS — J9622 Acute and chronic respiratory failure with hypercapnia: Secondary | ICD-10-CM | POA: Diagnosis not present

## 2019-01-13 DIAGNOSIS — F419 Anxiety disorder, unspecified: Secondary | ICD-10-CM | POA: Diagnosis not present

## 2019-01-13 DIAGNOSIS — J9611 Chronic respiratory failure with hypoxia: Secondary | ICD-10-CM | POA: Diagnosis not present

## 2019-01-13 DIAGNOSIS — M25512 Pain in left shoulder: Secondary | ICD-10-CM

## 2019-01-13 DIAGNOSIS — I272 Pulmonary hypertension, unspecified: Secondary | ICD-10-CM | POA: Diagnosis not present

## 2019-01-13 DIAGNOSIS — I110 Hypertensive heart disease with heart failure: Secondary | ICD-10-CM | POA: Diagnosis not present

## 2019-01-13 DIAGNOSIS — J439 Emphysema, unspecified: Secondary | ICD-10-CM | POA: Diagnosis not present

## 2019-01-13 DIAGNOSIS — E119 Type 2 diabetes mellitus without complications: Secondary | ICD-10-CM | POA: Diagnosis not present

## 2019-01-13 DIAGNOSIS — I69354 Hemiplegia and hemiparesis following cerebral infarction affecting left non-dominant side: Secondary | ICD-10-CM | POA: Diagnosis not present

## 2019-01-13 DIAGNOSIS — I5043 Acute on chronic combined systolic (congestive) and diastolic (congestive) heart failure: Secondary | ICD-10-CM | POA: Diagnosis not present

## 2019-01-13 NOTE — Telephone Encounter (Signed)
Denise [name] have been notified and she will let patient know

## 2019-01-13 NOTE — Telephone Encounter (Signed)
Gave the approval for the verbal order and xray. Izora Gala asked if patient can come into our office to get the xray done. Izora Gala stated that she is not sure why Rehab did not get this done bore releasing patient. Is that okay?

## 2019-01-13 NOTE — Telephone Encounter (Signed)
Request OT once Week for 8 weeks ADL and strengthen   She stated and LPN called last week wanting to see if kate wanted to have xray on pt she fell when she was in Rehab.  Pt has had sharpe pain in left shoulder.   Please advise

## 2019-01-13 NOTE — Telephone Encounter (Signed)
Approve OT orders. Approve xray.

## 2019-01-13 NOTE — Telephone Encounter (Signed)
Noted, Xray ordered.

## 2019-01-14 ENCOUNTER — Ambulatory Visit (INDEPENDENT_AMBULATORY_CARE_PROVIDER_SITE_OTHER)
Admission: RE | Admit: 2019-01-14 | Discharge: 2019-01-14 | Disposition: A | Discharge status: Home / Self Care | Payer: Medicare HMO | Source: Ambulatory Visit | Attending: Primary Care | Admitting: Primary Care

## 2019-01-14 DIAGNOSIS — S4992XA Unspecified injury of left shoulder and upper arm, initial encounter: Secondary | ICD-10-CM | POA: Diagnosis not present

## 2019-01-14 DIAGNOSIS — M25512 Pain in left shoulder: Secondary | ICD-10-CM

## 2019-01-15 ENCOUNTER — Other Ambulatory Visit (HOSPITAL_COMMUNITY): Payer: Self-pay

## 2019-01-15 ENCOUNTER — Telehealth: Payer: Self-pay | Admitting: Primary Care

## 2019-01-15 ENCOUNTER — Telehealth: Payer: Self-pay

## 2019-01-15 ENCOUNTER — Telehealth: Payer: Self-pay | Admitting: Internal Medicine

## 2019-01-15 DIAGNOSIS — E119 Type 2 diabetes mellitus without complications: Secondary | ICD-10-CM | POA: Diagnosis not present

## 2019-01-15 DIAGNOSIS — I5043 Acute on chronic combined systolic (congestive) and diastolic (congestive) heart failure: Secondary | ICD-10-CM | POA: Diagnosis not present

## 2019-01-15 DIAGNOSIS — Z8673 Personal history of transient ischemic attack (TIA), and cerebral infarction without residual deficits: Secondary | ICD-10-CM

## 2019-01-15 DIAGNOSIS — F419 Anxiety disorder, unspecified: Secondary | ICD-10-CM | POA: Diagnosis not present

## 2019-01-15 DIAGNOSIS — J9611 Chronic respiratory failure with hypoxia: Secondary | ICD-10-CM | POA: Diagnosis not present

## 2019-01-15 DIAGNOSIS — J439 Emphysema, unspecified: Secondary | ICD-10-CM | POA: Diagnosis not present

## 2019-01-15 DIAGNOSIS — I110 Hypertensive heart disease with heart failure: Secondary | ICD-10-CM | POA: Diagnosis not present

## 2019-01-15 DIAGNOSIS — I272 Pulmonary hypertension, unspecified: Secondary | ICD-10-CM | POA: Diagnosis not present

## 2019-01-15 DIAGNOSIS — I69354 Hemiplegia and hemiparesis following cerebral infarction affecting left non-dominant side: Secondary | ICD-10-CM | POA: Diagnosis not present

## 2019-01-15 DIAGNOSIS — J9622 Acute and chronic respiratory failure with hypercapnia: Secondary | ICD-10-CM | POA: Diagnosis not present

## 2019-01-15 NOTE — Progress Notes (Signed)
Paramedicine Encounter    Patient ID: Denise [name], female    DOB: Nov 12, 1945, 74 y.o.   MRN: [id number]    Patient Care Team: [name], NP as PCP - General (Internal Medicine) [name], MD as Consulting Physician (Cardiology)  Patient Active Problem List   Diagnosis Date Noted  . Acute on chronic respiratory failure with hypoxia ([place]) 12/30/2018  . Acute on chronic diastolic CHF (congestive heart failure) ([place]) 12/30/2018  . Acute ischemic right MCA stroke ([place]) 11/27/2018  . Vaginal candidiasis   . Acute blood loss anemia   . Supplemental oxygen dependent   . Uncontrolled type 2 diabetes mellitus with hyperglycemia ([place])   . ICH (intracerebral hemorrhage) ([place]) s/p tPA administration 11/08/2018  . Hypertensive urgency 11/08/2018  . Cerebellar stroke, acute ([place]) 11/08/2018  . History of ischemic stroke 11/04/2018  . COPD with acute exacerbation ([place]) 10/28/2018  . Acute on chronic systolic CHF (congestive heart failure) ([place]) 10/28/2018  . Elevated troponin 10/28/2018  . Lactic acidosis 10/28/2018  . GERD (gastroesophageal reflux disease) 10/28/2018  . Acute on chronic respiratory failure with hypercapnia ([place]) 10/28/2018  . Diabetes mellitus without complication-diet controled 10/28/2018  . On home oxygen therapy 07/12/2018  . CHF (congestive heart failure) ([place]) 05/16/2018  . Chronic respiratory failure with hypoxia and hypercapnia ([place]) 05/03/2018  . Borderline abnormal TFTs 05/02/2018  . Type 2 diabetes mellitus ([place]) 04/26/2018  . Unintended weight loss 04/26/2018  . Lower extremity edema 02/20/2018  . Chronic respiratory failure with hypoxia ([place]) 10/02/2017  . Hematuria 09/27/2017  . Pulmonary hypertension ([name]) c/w cor pulmonale   . Protein-calorie malnutrition, severe 09/17/2017  . Constipation   . Hyperparathyroidism, primary ([place]) 07/27/2016  . Loss of appetite 06/02/2016  . COPD GOLD  II  spirometry, severe Emphysema and 02 dep  11/22/2015  . Vitamin D deficiency 11/22/2015  . Anxiety and depression 11/22/2015  . Insomnia 11/22/2015  . Hyperlipidemia 11/22/2015  . Essential hypertension 11/22/2015  . Hx of colonic polyps 02/10/2013  . History of colon cancer 06/11/1986    Current Outpatient Medications:  .  atorvastatin (LIPITOR) 20 MG tablet, Take 1 tablet (20 mg total) by mouth every evening. For cholesterol (Patient taking differently: Take 20 mg by mouth at bedtime. For cholesterol), Disp: 90 tablet, Rfl: 3 .  furosemide (LASIX) 40 MG tablet, Take 40 mg by mouth daily. , Disp: , Rfl:  .  glipiZIDE (GLUCOTROL) 5 MG tablet, Take 1 tablet (5 mg total) by mouth daily before breakfast., Disp: 90 tablet, Rfl: 1 .  metoprolol tartrate (LOPRESSOR) 25 MG tablet, TAKE 0.5 TABLETS (12.5 MG TOTAL) BY MOUTH 2 (TWO) TIMES DAILY. (Patient taking differently: Take 12.5 mg by mouth 2 (two) times daily with a meal. ), Disp: 90 tablet, Rfl: 0 .  mirtazapine (REMERON) 15 MG tablet, TAKE 1 TABLET BY MOUTH AT BEDTIME. FOR DEPRESSION AND APPETITE. (Patient taking differently: Take 15 mg by mouth at bedtime. ), Disp: 90 tablet, Rfl: 3 .  potassium chloride (KLOR-CON M10) 10 MEQ tablet, Take 1 tablet (10 mEq total) by mouth daily., Disp: 30 tablet, Rfl: 0 .  spironolactone (ALDACTONE) 25 MG tablet, Take 1 tablet (25 mg total) by mouth daily. Take at night (Patient taking differently: Take 25 mg by mouth daily. ), Disp: 30 tablet, Rfl: 0 .  albuterol (VENTOLIN HFA) 108 (90 Base) MCG/ACT inhaler, INHALE 1-2 PUFFS INTO THE LUNGS EVERY 4 HOURS AS NEEDED FOR WHEEZING OR SHORTNESS OF BREATH. (Patient taking differently: Inhale 1-2  puffs into the lungs every 4 (four) hours as needed for wheezing. ), Disp: 6.7 g, Rfl: 5 .  apixaban (ELIQUIS) 5 MG TABS tablet, Take 1 tablet (5 mg total) by mouth 2 (two) times daily. (Patient taking differently: Take 5 mg by mouth 2 (two) times daily with a meal. ), Disp: 60 tablet, Rfl: 0 .  Blood Glucose Calibration  (ACCU-CHEK AVIVA) SOLN, USE AS INSTRUCTED TO TEST BLOOD SUGAR DAILY (Patient taking differently: 1 each by Other route daily. ), Disp: 1 each, Rfl: 2 .  Blood Glucose Monitoring Suppl (ACCU-CHEK AVIVA PLUS) w/Device KIT, Use as instructed to test blood sugar up to 3 times daily (Patient taking differently: 1 each by Other route 3 (three) times daily. ), Disp: 1 kit, Rfl: 0 .  fluticasone furoate-vilanterol (BREO ELLIPTA) 100-25 MCG/INH AEPB, Inhale 1 puff into the lungs daily., Disp: , Rfl:  .  glucose blood (ACCU-CHEK AVIVA PLUS) test strip, Use as instructed to test blood sugar up to 3 times daily, Disp: 300 each, Rfl: 1 .  Lancets (ACCU-CHEK SOFT TOUCH) lancets, Use as instructed to test blood sugar up to 3 times daily (Patient taking differently: 1 each by Other route 3 (three) times daily. ), Disp: 300 each, Rfl: 1 .  OXYGEN, Inhale 4 L into the lungs continuous. , Disp: , Rfl:  .  umeclidinium bromide (INCRUSE ELLIPTA) 62.5 MCG/INH AEPB, Inhale 1 puff into the lungs daily., Disp:  , Rfl:  No Known Allergies   Social History   Socioeconomic History  . Marital status: Married    Spouse name: Not on file  . Number of children: 3  . Years of education: Not on file  . Highest education level: Not on file  Occupational History  . Not on file  Tobacco Use  . Smoking status: Former Smoker    Packs/day: 1.00    Years: 30.00    Pack years: 30.00    Types: Cigarettes    Quit date: 1989    Years since quitting: 32.0  . Smokeless tobacco: Never Used  Substance and Sexual Activity  . Alcohol use: No  . Drug use: No  . Sexual activity: Not Currently    Partners: Male    Birth control/protection: Post-menopausal, Surgical  Other Topics Concern  . Not on file  Social History Narrative   Married. 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  Difficulty of Paying Living Expenses: Not on file  Food Insecurity:   . Worried About Charity fundraiser in the Last Year: Not on file  . Ran Out of Food in the Last Year: Not on file  Transportation Needs:   . Lack of Transportation (Medical): Not on file  . Lack of Transportation (Non-Medical): Not on file  Physical Activity:   . Days of Exercise per Week: Not on file  . Minutes of Exercise per Session: Not on file  Stress:   . Feeling of Stress : Not on file  Social Connections:   . Frequency of Communication with Friends and Family: Not on file  . Frequency of Social Gatherings with Friends and Family: Not on file  . Attends Religious Services: Not on file  . Active Member of Clubs or Organizations: Not on file  . Attends Archivist Meetings:  Not on file  . Marital Status: Not on file  Intimate Partner Violence:   . Fear of Current or Ex-Partner: Not on file  . Emotionally Abused: Not on file  . Physically Abused: Not on file  . Sexually Abused: Not on file    Physical Exam      Future Appointments  Date Time Provider [place]  05/26/2019 10:00 AM Tanda Rockers, MD [facility] None     Pulse 60   SpO2 93%    ATF pt  CAO to her norm sitting at the kitchen table. She just came in from an appointment and appears to be in good spirits.  Dr. Carlis Abbott prescribed glipizide that her husband picked up today.  There's still no prescription sent in for eliquis to her pharmacy, it's unknown if Dr. Carlis Abbott would like her to take it while at home.  ASA and Prednisone has been deleted from her medication lists. I will ask Dr. Ainsley Spinner nurse about that also.  I left a message with the call taker at Dr. Ainsley Spinner office, she said that someone will follow up with me tomorrow.  [name] denies pain, sob and dizziness.  She ate a barbeque sandwich a few mins ago.  rx bottles verified and pill box refilled.   I will f/u with Mr. Gibler tomorrow after I speak with Dr. Carlis Abbott or  her RN.    Is she still suppose to take prednisone; no eliquis sent to the pharmacy; no asa listed;     Medication ordered: None  Alcie Runions, EMT Paramedic [phone] 01/15/2019    ACTION: Home visit completed

## 2019-01-15 NOTE — Telephone Encounter (Signed)
Jess Barters, Paramedic who takes care of patient's medication called today requesting a call back    She stated that the patient just returned home, she had recently had a stroke.   She notcied in epic the patient has Eliquis on her medication list but a prescription was not send into her pharmacy and also the prednisone was taken off  She wanted a call back to discuss this Phone- [phone]

## 2019-01-15 NOTE — Telephone Encounter (Signed)
This can wait until Clearence Cheek return in the morning.

## 2019-01-15 NOTE — Telephone Encounter (Signed)
Spoke with patient's husband Chalmers Cater regarding Palliative services and he wanted to talk with other family members about this first before scheduling a time for the Consult.  I gave him my contact information

## 2019-01-15 NOTE — Telephone Encounter (Signed)
Claiborne Billings nurse with [facility] left v/m;Kelly saw pt today and pt was reporting abd pain after pt eats on and off. Tums seem to help. Kelly request omeprazole or pantoprazole for indigestion. Pt does not have gastroenterologist. These meds are too expensive OTC.pt also requested a hospital bed. Claiborne Billings request rx for hospital bed for [facility] ([facility] used to be [facility]). Claiborne Billings request cb. Gentry Fitz NP is out of office and does not have computer access. Will send note to Gentry Fitz NP and Avie Echevaria NP who is in the office.

## 2019-01-16 ENCOUNTER — Other Ambulatory Visit: Payer: Self-pay

## 2019-01-16 DIAGNOSIS — F419 Anxiety disorder, unspecified: Secondary | ICD-10-CM | POA: Diagnosis not present

## 2019-01-16 DIAGNOSIS — I69354 Hemiplegia and hemiparesis following cerebral infarction affecting left non-dominant side: Secondary | ICD-10-CM | POA: Diagnosis not present

## 2019-01-16 DIAGNOSIS — I272 Pulmonary hypertension, unspecified: Secondary | ICD-10-CM | POA: Diagnosis not present

## 2019-01-16 DIAGNOSIS — J439 Emphysema, unspecified: Secondary | ICD-10-CM | POA: Diagnosis not present

## 2019-01-16 DIAGNOSIS — I110 Hypertensive heart disease with heart failure: Secondary | ICD-10-CM | POA: Diagnosis not present

## 2019-01-16 DIAGNOSIS — E119 Type 2 diabetes mellitus without complications: Secondary | ICD-10-CM | POA: Diagnosis not present

## 2019-01-16 DIAGNOSIS — J9611 Chronic respiratory failure with hypoxia: Secondary | ICD-10-CM | POA: Diagnosis not present

## 2019-01-16 DIAGNOSIS — I5043 Acute on chronic combined systolic (congestive) and diastolic (congestive) heart failure: Secondary | ICD-10-CM | POA: Diagnosis not present

## 2019-01-16 DIAGNOSIS — J9622 Acute and chronic respiratory failure with hypercapnia: Secondary | ICD-10-CM | POA: Diagnosis not present

## 2019-01-16 MED ORDER — APIXABAN 5 MG PO TABS: 5.0000 mg | ORAL_TABLET | Freq: Two times a day (BID) | ORAL | 1 refills | Status: DC

## 2019-01-16 NOTE — Telephone Encounter (Signed)
I called & spoke with Claiborne Billings to let her know that prescription for famotidine was sent to pharmacy to take daily BID. She did not know fax number to [facility] ([facility]). I called & was able to get fax number & have faxed to [facility].

## 2019-01-16 NOTE — Telephone Encounter (Signed)
Noted, Rx for Eliquis sent to pharmacy.

## 2019-01-16 NOTE — Telephone Encounter (Signed)
Please call Claiborne Billings:  If Tums are helping then I recommend we start with famotidine (Pepcid) for GERD. She can take this twice daily, I'll send a prescription to her pharmacy.  Rx for hospital bed written and placed in Chan's inbox.

## 2019-01-16 NOTE — Telephone Encounter (Signed)
Based off of her hospital discharge notes from 12/06/18 and 12/30/18 she is to be taking Eliquis twice daily. She is NOT to be taking clopidogrel (Plavix) or aspirin. I will send a prescription of Eliquis to her pharmacy. She is also to be following up with neurology at some point.   Which pharmacy?

## 2019-01-17 ENCOUNTER — Telehealth: Payer: Self-pay | Admitting: Lab

## 2019-01-17 ENCOUNTER — Emergency Department (HOSPITAL_COMMUNITY): Payer: Medicare HMO

## 2019-01-17 ENCOUNTER — Telehealth: Payer: Self-pay

## 2019-01-17 ENCOUNTER — Telehealth (HOSPITAL_COMMUNITY): Payer: Self-pay

## 2019-01-17 ENCOUNTER — Emergency Department (HOSPITAL_COMMUNITY)
Admission: EM | Admit: 2019-01-17 | Discharge: 2019-01-17 | Disposition: A | Discharge status: Home / Self Care | Payer: Medicare HMO | Attending: Emergency Medicine | Admitting: Emergency Medicine

## 2019-01-17 ENCOUNTER — Telehealth: Payer: Self-pay | Admitting: Primary Care

## 2019-01-17 ENCOUNTER — Other Ambulatory Visit: Payer: Self-pay

## 2019-01-17 DIAGNOSIS — J449 Chronic obstructive pulmonary disease, unspecified: Secondary | ICD-10-CM | POA: Diagnosis not present

## 2019-01-17 DIAGNOSIS — R197 Diarrhea, unspecified: Secondary | ICD-10-CM | POA: Diagnosis not present

## 2019-01-17 DIAGNOSIS — I110 Hypertensive heart disease with heart failure: Secondary | ICD-10-CM | POA: Insufficient documentation

## 2019-01-17 DIAGNOSIS — Z8673 Personal history of transient ischemic attack (TIA), and cerebral infarction without residual deficits: Secondary | ICD-10-CM | POA: Insufficient documentation

## 2019-01-17 DIAGNOSIS — R1033 Periumbilical pain: Secondary | ICD-10-CM

## 2019-01-17 DIAGNOSIS — Z9981 Dependence on supplemental oxygen: Secondary | ICD-10-CM | POA: Insufficient documentation

## 2019-01-17 DIAGNOSIS — Z87891 Personal history of nicotine dependence: Secondary | ICD-10-CM | POA: Diagnosis not present

## 2019-01-17 DIAGNOSIS — Z79899 Other long term (current) drug therapy: Secondary | ICD-10-CM | POA: Insufficient documentation

## 2019-01-17 DIAGNOSIS — R05 Cough: Secondary | ICD-10-CM | POA: Diagnosis present

## 2019-01-17 DIAGNOSIS — Z20822 Contact with and (suspected) exposure to covid-19: Secondary | ICD-10-CM | POA: Insufficient documentation

## 2019-01-17 DIAGNOSIS — I5023 Acute on chronic systolic (congestive) heart failure: Secondary | ICD-10-CM | POA: Diagnosis not present

## 2019-01-17 DIAGNOSIS — E119 Type 2 diabetes mellitus without complications: Secondary | ICD-10-CM | POA: Insufficient documentation

## 2019-01-17 DIAGNOSIS — Z7901 Long term (current) use of anticoagulants: Secondary | ICD-10-CM | POA: Insufficient documentation

## 2019-01-17 DIAGNOSIS — I10 Essential (primary) hypertension: Secondary | ICD-10-CM | POA: Diagnosis not present

## 2019-01-17 DIAGNOSIS — A09 Infectious gastroenteritis and colitis, unspecified: Secondary | ICD-10-CM | POA: Diagnosis not present

## 2019-01-17 DIAGNOSIS — R109 Unspecified abdominal pain: Secondary | ICD-10-CM | POA: Diagnosis not present

## 2019-01-17 LAB — CBC WITH DIFFERENTIAL/PLATELET
Abs Immature Granulocytes: 0.02 10*3/uL (ref 0.00–0.07)
Basophils Absolute: 0.1 10*3/uL (ref 0.0–0.1)
Basophils Relative: 1 %
Eosinophils Absolute: 0.1 10*3/uL (ref 0.0–0.5)
Eosinophils Relative: 2 %
HCT: 38.1 % (ref 36.0–46.0)
Hemoglobin: 11.5 g/dL — ABNORMAL LOW (ref 12.0–15.0)
Immature Granulocytes: 0 %
Lymphocytes Relative: 13 %
Lymphs Abs: 0.8 10*3/uL (ref 0.7–4.0)
MCH: 26.5 pg (ref 26.0–34.0)
MCHC: 30.2 g/dL (ref 30.0–36.0)
MCV: 87.8 fL (ref 80.0–100.0)
Monocytes Absolute: 0.5 10*3/uL (ref 0.1–1.0)
Monocytes Relative: 7 %
Neutro Abs: 5.1 10*3/uL (ref 1.7–7.7)
Neutrophils Relative %: 77 %
Platelets: 190 10*3/uL (ref 150–400)
RBC: 4.34 MIL/uL (ref 3.87–5.11)
RDW: 16.9 % — ABNORMAL HIGH (ref 11.5–15.5)
WBC: 6.6 10*3/uL (ref 4.0–10.5)
nRBC: 0 % (ref 0.0–0.2)

## 2019-01-17 LAB — URINALYSIS, ROUTINE W REFLEX MICROSCOPIC
Bilirubin Urine: NEGATIVE
Glucose, UA: NEGATIVE mg/dL
Hgb urine dipstick: NEGATIVE
Ketones, ur: NEGATIVE mg/dL
Leukocytes,Ua: NEGATIVE
Nitrite: NEGATIVE
Protein, ur: NEGATIVE mg/dL
Specific Gravity, Urine: 1.011 (ref 1.005–1.030)
pH: 8 (ref 5.0–8.0)

## 2019-01-17 LAB — COMPREHENSIVE METABOLIC PANEL
ALT: 44 U/L (ref 0–44)
AST: 49 U/L — ABNORMAL HIGH (ref 15–41)
Albumin: 3.2 g/dL — ABNORMAL LOW (ref 3.5–5.0)
Alkaline Phosphatase: 98 U/L (ref 38–126)
Anion gap: 10 (ref 5–15)
BUN: 17 mg/dL (ref 8–23)
CO2: 30 mmol/L (ref 22–32)
Calcium: 10.8 mg/dL — ABNORMAL HIGH (ref 8.9–10.3)
Chloride: 102 mmol/L (ref 98–111)
Creatinine, Ser: 0.96 mg/dL (ref 0.44–1.00)
GFR calc Af Amer: >60 mL/min (ref >60)
GFR calc non Af Amer: 59 mL/min — ABNORMAL LOW (ref >60)
Glucose, Bld: 170 mg/dL — ABNORMAL HIGH (ref 70–99)
Potassium: 4.6 mmol/L (ref 3.5–5.1)
Sodium: 142 mmol/L (ref 135–145)
Total Bilirubin: 0.7 mg/dL (ref 0.3–1.2)
Total Protein: 5.9 g/dL — ABNORMAL LOW (ref 6.5–8.1)

## 2019-01-17 LAB — LIPASE, BLOOD: Lipase: 31 U/L (ref 11–51)

## 2019-01-17 LAB — SARS CORONAVIRUS 2 (TAT 6-24 HRS): SARS Coronavirus 2: NEGATIVE

## 2019-01-17 LAB — POC OCCULT BLOOD, ED: Fecal Occult Bld: NEGATIVE

## 2019-01-17 LAB — POC SARS CORONAVIRUS 2 AG -  ED: SARS Coronavirus 2 Ag: NEGATIVE

## 2019-01-17 MED ORDER — IOHEXOL 300 MG/ML  SOLN
100.0000 mL | Freq: Once | INTRAMUSCULAR | Status: AC | PRN
  Administered 2019-01-17: 100 mL via INTRAVENOUS

## 2019-01-17 MED ORDER — SODIUM CHLORIDE 0.9 % IV BOLUS
250.0000 mL | Freq: Once | INTRAVENOUS | Status: AC
  Administered 2019-01-17: 250 mL via INTRAVENOUS

## 2019-01-17 NOTE — ED Provider Notes (Signed)
Transfer of Care Note I assumed care of Denise [name] on 01/17/2019  Briefly, Denise [name] is a 74 y.o. female who:  Presents with diarrhea  The plan includes:  F/u CT scan, likely discharge    Please refer to the original provider's note for additional information regarding the care of [employer].  ### Reassessment: I personally reassessed the patient:  Vital Signs:  The most current vitals were  Vitals:   01/17/19 1630 01/17/19 1700  BP: (!) 162/86 (!) 156/84  Pulse: 81   Resp: (!) 26 (!) 25  Temp:    SpO2: 98%     Hemodynamics:  The patient is hemodynamically stable. Mental Status:  The patient is Alert  Additional MDM: Labs reassuring, vital signs reassuring as well on reassessment.  Patient states she like to go home.  Abdomen benign on my exam, CT scan was obtained which revealed no acute emergent findings, patient does have some signs of colitis, consistent with her illness.  Covid negative.  Return precautions were given, discharged home in good condition.  The attending physician was present and available for all medical decision making and procedures related to this patient's care.    Kizzie Fantasia, MD 01/17/19 1717    Quintella Reichert, MD 01/17/19 2005

## 2019-01-17 NOTE — Telephone Encounter (Signed)
The pharmacist at [facility] called to verify Denise [name] medications.

## 2019-01-17 NOTE — ED Notes (Signed)
Spoke with pt's husband, he will be coming to transport pt home. Also provided him an update.

## 2019-01-17 NOTE — ED Provider Notes (Signed)
[facility] EMERGENCY DEPARTMENT Provider Note   CSN: [id number] Arrival date & time: 01/17/19  6222     History Chief Complaint  Patient presents with  . Diarrhea  . Cough    Denise [name] is a 73 y.o. female with h/o diabetes diet controlled, HTN, chronic respiratory failure on 4 L [place], COPD, pulmonary HTN, strokes on [place], diastolic CHF EF 97-98% presents to ER for evaluation of diarrhea associated with abdominal pain for last 3-4 days. Pain is intermittent, moderate crampy around belly button, none currently.  Had 3 "loose" episodes of diarrhea yesterday.  Describes stool and brown with specks of ketchup colored streaks in it.  No BM today.  States she has been eating ok and drinking fluids.  Reports breathing is always bad but using 4 L [place] and feels like her breathing is at her baseline and not any worse than usual.  No fever, nausea, vomiting, chest pain, changes to her breathing. No dysuria.  Ate mashed potatoes and fish before symptoms started. No sick contacts at home. Recent admission 12/28-12/29. No interventions. No modifying factors. Patient denies abdominal surgeries.   HPI     Past Medical History:  Diagnosis Date  . Altered mental status   . Anxiety and depression   . Chronic respiratory failure with hypoxia ([place]) 10/02/2017   Assoc with ? Cor pulmonale dx 09/2017 so rx 02 1-2 lpm 24/7  - 10/17/2017  Walked RA x 3 laps @ 185 ft each stopped due to  End of study, nl pace,  desat to 87 on 3rd lap - 12/19/2017  Saturations on Room Air at Rest =91 % and while Ambulating = 87%  But  on  2 Liters of pulsed oxygen while Ambulating =93% so rec POC 2lpm walking / use at rest if sats under 90%      . Colon cancer ([place]) 1988   Resected  . COPD (chronic obstructive pulmonary disease) ([place])   . Diabetes mellitus without complication ([name])    diet controlled- no meds. per pt  . Diarrhea 04/02/2018  . Diverticulosis   . Hyperlipidemia   . Hypertension   .  Hypoxia 10/30/2018  . Insomnia   . Primary hyperparathyroidism ([place])   . SBO (small bowel obstruction) ([place])   . Stroke ([place]) 10/2018   tpa administered  . Tubular adenoma of rectum 02/23/2013   low grade  . Vitamin D deficiency     Patient Active Problem List   Diagnosis Date Noted  . Acute on chronic respiratory failure with hypoxia ([place]) 12/30/2018  . Acute on chronic diastolic CHF (congestive heart failure) ([place]) 12/30/2018  . Acute ischemic right MCA stroke ([place]) 11/27/2018  . Vaginal candidiasis   . Acute blood loss anemia   . Supplemental oxygen dependent   . Uncontrolled type 2 diabetes mellitus with hyperglycemia ([place])   . ICH (intracerebral hemorrhage) ([place]) s/p tPA administration 11/08/2018  . Hypertensive urgency 11/08/2018  . Cerebellar stroke, acute ([place]) 11/08/2018  . History of ischemic stroke 11/04/2018  . COPD with acute exacerbation ([place]) 10/28/2018  . Acute on chronic systolic CHF (congestive heart failure) ([place]) 10/28/2018  . Elevated troponin 10/28/2018  . Lactic acidosis 10/28/2018  . GERD (gastroesophageal reflux disease) 10/28/2018  . Acute on chronic respiratory failure with hypercapnia ([place]) 10/28/2018  . Diabetes mellitus without complication-diet controled 10/28/2018  . On home oxygen therapy 07/12/2018  . CHF (congestive heart failure) ([place]) 05/16/2018  . Chronic respiratory failure with hypoxia and hypercapnia (  [place]) 05/03/2018  . Borderline abnormal TFTs 05/02/2018  . Type 2 diabetes mellitus ([place]) 04/26/2018  . Unintended weight loss 04/26/2018  . Lower extremity edema 02/20/2018  . Chronic respiratory failure with hypoxia ([place]) 10/02/2017  . Hematuria 09/27/2017  . Pulmonary hypertension ([name]) c/w cor pulmonale   . Protein-calorie malnutrition, severe 09/17/2017  . Constipation   . Hyperparathyroidism, primary ([place]) 07/27/2016  . Loss of appetite 06/02/2016  . COPD GOLD  II  spirometry, severe Emphysema and 02 dep 11/22/2015  . Vitamin D  deficiency 11/22/2015  . Anxiety and depression 11/22/2015  . Insomnia 11/22/2015  . Hyperlipidemia 11/22/2015  . Essential hypertension 11/22/2015  . Hx of colonic polyps 02/10/2013  . History of colon cancer 06/11/1986    Past Surgical History:  Procedure Laterality Date  . ABDOMINAL HYSTERECTOMY  08/2015  . BREAST BIOPSY Left   . BREAST EXCISIONAL BIOPSY Right   . COLON RESECTION  1980's  . COLONOSCOPY    . ENDOMETRIAL BIOPSY  2009   negative  . INCONTINENCE SURGERY  2017  . LOOP RECORDER INSERTION N/A 11/08/2018   Procedure: LOOP RECORDER INSERTION;  Surgeon: Constance Haw, MD;  Location: [facility];  Service: Cardiovascular;  Laterality: N/A;  . RIGHT HEART CATH N/A 09/20/2017   Procedure: RIGHT HEART CATH;  Surgeon: Larey Dresser, MD;  Location: [facility];  Service: Cardiovascular;  Laterality: N/A;  . RIGHT/LEFT HEART CATH AND CORONARY ANGIOGRAPHY N/A 03/19/2018   Procedure: RIGHT/LEFT HEART CATH AND CORONARY ANGIOGRAPHY;  Surgeon: Larey Dresser, MD;  Location: [facility];  Service: Cardiovascular;  Laterality: N/A;     OB History   No obstetric history on file.     Family History  Problem Relation Age of Onset  . Ovarian cancer Mother   . Breast cancer Neg Hx   . Hyperparathyroidism Neg Hx   . Colon cancer Neg Hx   . Esophageal cancer Neg Hx   . Stomach cancer Neg Hx     Social History   Tobacco Use  . Smoking status: Former Smoker    Packs/day: 1.00    Years: 30.00    Pack years: 30.00    Types: Cigarettes    Quit date: 1989    Years since quitting: 32.0  . Smokeless tobacco: Never Used  Substance Use Topics  . Alcohol use: No  . Drug use: No    Home Medications Prior to Admission medications   Medication Sig Start Date End Date Taking? Authorizing Provider  OXYGEN Inhale 4 L into the lungs continuous.    Yes [provider]  polyethylene glycol powder (GLYCOLAX/MIRALAX) 17 GM/SCOOP powder MIX 1 PACKET  IN 4 TO 8 OZ. OF LIQUID AND DRINK...USE ONCE DAILY AS NEEDED 08/13/15  Yes [provider]  albuterol (VENTOLIN HFA) 108 (90 Base) MCG/ACT inhaler INHALE 1-2 PUFFS INTO THE LUNGS EVERY 4 HOURS AS NEEDED FOR WHEEZING OR SHORTNESS OF BREATH. Patient taking differently: Inhale 1-2 puffs into the lungs every 4 (four) hours as needed for wheezing.  04/30/18   Parrett, Fonnie Mu, NP  apixaban (ELIQUIS) 5 MG TABS tablet Take 1 tablet (5 mg total) by mouth 2 (two) times daily with a meal. For stroke prevention. 01/16/19   [name], NP  atorvastatin (LIPITOR) 20 MG tablet Take 1 tablet (20 mg total) by mouth every evening. For cholesterol Patient taking differently: Take 20 mg by mouth at bedtime. For cholesterol 12/12/18   [name],  NP  Blood Glucose Calibration (ACCU-CHEK AVIVA) SOLN USE AS INSTRUCTED TO TEST BLOOD SUGAR DAILY Patient taking differently: 1 each by Other route daily.  05/28/18   [name], NP  Blood Glucose Monitoring Suppl (ACCU-CHEK AVIVA PLUS) w/Device KIT Use as instructed to test blood sugar up to 3 times daily Patient taking differently: 1 each by Other route 3 (three) times daily.  05/28/18   [name], NP  fluticasone furoate-vilanterol (BREO ELLIPTA) 100-25 MCG/INH AEPB Inhale 1 puff into the lungs daily.    [provider]  furosemide (LASIX) 40 MG tablet Take 40 mg by mouth daily.     [provider]  glipiZIDE (GLUCOTROL) 5 MG tablet Take 1 tablet (5 mg total) by mouth daily before breakfast. 01/07/19   [name], NP  glucose blood (ACCU-CHEK AVIVA PLUS) test strip Use as instructed to test blood sugar up to 3 times daily 05/28/18   [name], NP  Lancets (ACCU-CHEK SOFT TOUCH) lancets Use as instructed to test blood sugar up to 3 times daily Patient taking differently: 1 each by Other route 3 (three) times daily.  05/28/18   [name], NP  metoprolol tartrate (LOPRESSOR) 25 MG tablet TAKE 0.5  TABLETS (12.5 MG TOTAL) BY MOUTH 2 (TWO) TIMES DAILY. Patient taking differently: Take 12.5 mg by mouth 2 (two) times daily with a meal.  12/12/18   [name], NP  mirtazapine (REMERON) 15 MG tablet TAKE 1 TABLET BY MOUTH AT BEDTIME. FOR DEPRESSION AND APPETITE. Patient taking differently: Take 15 mg by mouth at bedtime.  09/25/18   [name], NP  potassium chloride (KLOR-CON M10) 10 MEQ tablet Take 1 tablet (10 mEq total) by mouth daily. 11/19/18   Love, Ivan Anchors, PA-C  spironolactone (ALDACTONE) 25 MG tablet Take 1 tablet (25 mg total) by mouth daily. Take at night Patient taking differently: Take 25 mg by mouth daily.  11/19/18   Love, Ivan Anchors, PA-C  umeclidinium bromide (INCRUSE ELLIPTA) 62.5 MCG/INH AEPB Inhale 1 puff into the lungs daily. 11/20/18   Bary Leriche, PA-C    Allergies    Patient has no known allergies.  Review of Systems   Review of Systems  Gastrointestinal: Positive for abdominal pain, blood in stool and diarrhea.  Hematological: Bruises/bleeds easily.  All other systems reviewed and are negative.   Physical Exam Updated Vital Signs BP (!) 157/96   Pulse 70   Temp 98.5 F (36.9 C) (Oral)   Resp (!) 24   SpO2 99%   Physical Exam Vitals and nursing note reviewed.  Constitutional:      Appearance: She is well-developed.     Comments: Frail appearing, but non toxic. Awake alert   HENT:     Head: Normocephalic and atraumatic.     Nose: Nose normal.     Mouth/Throat:     Comments: Dry lips but MMM Eyes:     Conjunctiva/sclera: Conjunctivae normal.  Cardiovascular:     Rate and Rhythm: Normal rate and regular rhythm.     Comments: No LE edema. No calf tenderness. 1+ radial and DP pulses bilaterally  Pulmonary:     Effort: Pulmonary effort is normal.     Breath sounds: Normal breath sounds.     Comments: On 4 L [place] SpO2 95%. Normal work of breathing. Speaking in full sentences. Slightly diminished air sounds in lower lobes bilaterally. No  wheezing, crackles.  Abdominal:     General: Bowel  sounds are normal.     Palpations: Abdomen is soft.     Tenderness: There is no abdominal tenderness.     Comments: No G/R/R. No suprapubic or CVA tenderness. Negative Murphy's and McBurney's. Active BS to lower quadrants. No obvious hernia noted   Genitourinary:    Comments: Incontinent of stool, stool is brown. No hematochezia. No melena.  Musculoskeletal:        General: Normal range of motion.     Cervical back: Normal range of motion.  Skin:    General: Skin is warm and dry.     Capillary Refill: Capillary refill takes less than 2 seconds.  Neurological:     Mental Status: She is alert.  Psychiatric:        Behavior: Behavior normal.     ED Results / Procedures / Treatments   Labs (all labs ordered are listed, but only abnormal results are displayed) Labs Reviewed  CBC WITH DIFFERENTIAL/PLATELET - Abnormal; Notable for the following components:      Result Value   Hemoglobin 11.5 (*)    RDW 16.9 (*)    All other components within normal limits  COMPREHENSIVE METABOLIC PANEL - Abnormal; Notable for the following components:   Glucose, Bld 170 (*)    Calcium 10.8 (*)    Total Protein 5.9 (*)    Albumin 3.2 (*)    AST 49 (*)    GFR calc non Af Amer 59 (*)    All other components within normal limits  GI PATHOGEN PANEL BY PCR, STOOL  C DIFFICILE QUICK SCREEN W PCR REFLEX  SARS CORONAVIRUS 2 (TAT 6-24 HRS)  LIPASE, BLOOD  URINALYSIS, ROUTINE W REFLEX MICROSCOPIC  POC SARS CORONAVIRUS 2 AG -  ED  POC OCCULT BLOOD, ED    EKG EKG Interpretation  Date/Time:  Friday January 17 2019 09:13:25 EST Ventricular Rate:  86 PR Interval:    QRS Duration: 80 QT Interval:  329 QTC Calculation: 394 R Axis:   -18 Text Interpretation: Sinus rhythm Supraventricular bigeminy LVH with secondary repolarization abnormality Confirmed by Dene Gentry [phone]) on 01/17/2019 9:20:18 AM   Radiology No results  found.  Procedures Procedures (including critical care time)  Medications Ordered in ED Medications  sodium chloride 0.9 % bolus 250 mL (250 mLs Intravenous New Bag/Given 01/17/19 1210)    ED Course  I have reviewed the triage vital signs and the nursing notes.  Pertinent labs & imaging results that were available during my care of the patient were reviewed by me and considered in my medical decision making (see chart for details).  Clinical Course as of Jan 16 1521  Fri Jan 17, 2019  1150 AST(!): 49 [CG]  1225 Albumin(!): 3.2 [CG]  1226 Glucose(!): 170 [CG]  1226 Anion gap: 10 [CG]  1226 WBC: 6.6 [CG]  1226 13.3 on 12/29  Hemoglobin(!): 11.5 [CG]  1441 Hemoccult collected. Patient incontinent of stool. Asked RN to collect stool for testing.    [CG]  2800 CT tech states pt is second on list    [CG]    Clinical Course User Index [CG] Arlean Hopping   MDM Rules/Calculators/A&P                      EMR reviewed including last admission. She received doxycycline. Last CTAP 09/2017 showed SBO, bilateral renal cysts stable, aortic atherosclerosis.   Exam benign. Afebrile. No abd tenderness on exam. No peritonitis. No suprapubic or CVA tenderness.  Ddx includes viral gastroenteritis vs antibiotic induced diarrhea vs c. Diff vs diverticulitis.  Reports no issues with eating and ischemic gut unlikely.  Other etiology like SBO, ileus unlikely based on clinical presentation exam.  Labs, UA, COVID ordered. Gentle small bolus IVF suspect mild dehydration but h/o CHF. Will reassess.   1250: ER work up personally reviewed and interpreted, as above.  Drop in hemoglobin.  Will obtain hemoccult, CTAP.   1518: Hemocult negative. Repeat evaluation and no clinical decline. Patient continues to deny abd pain.  No emesis, diarrhea or abd pain while in ER.  HD stable. Discussed plan to obtain CTAP and dispo based on results.    CT has been called due to delay, pending.  RN to send  stool for testing. Send out COVID test ordered.  Will hand off to oncoming team who will f/u on CTAP. Anticipate dc with supportive care and close PCP f/u if benign imaging. Consider admission if clinical decline or significant radiographic findings. Shared with EDP.  Final Clinical Impression(s) / ED Diagnoses Final diagnoses:  Periumbilical abdominal pain  Diarrhea of presumed infectious origin    Rx / DC Orders ED Discharge Orders    None       Kinnie Feil, PA-C 01/17/19 1523    Valarie Merino, MD 01/20/19 [phone]

## 2019-01-17 NOTE — Discharge Instructions (Signed)
You were seen in the emergency department for diarrhea.  Your labs been reassuring, your CT scan is reassuring as well with no signs of abscess or infection at this time.  Please follow-up with your PCP for any worsening symptoms, return to the emergency department for worsening fever, abdominal pain, diarrhea or vomiting.

## 2019-01-17 NOTE — Telephone Encounter (Signed)
Per pt chart review tab pt is at [facility] ED.

## 2019-01-17 NOTE — Telephone Encounter (Signed)
[facility] Night - Client TELEPHONE ADVICE RECORD [facility] Patient Name: Denise [name] Gender: Female DOB: May 17, 1945 Age: 74 Y 39 M 29 D Return Phone Number: [phone] (Primary), [phone] (Secondary) Address: City/State/Zip: [place] [address] Client [facility] Night - Client Client Site [place] Physician Alma Friendly - NP Contact Type Call Who Is Calling Patient / Member / Family / Caregiver Call Type Triage / Clinical Caller Name [name] Relationship To Patient Spouse Return Phone Number [phone] (Primary) Chief Complaint Diarrhea Reason for Call Symptomatic / Request for [place] states he is calling for his wife she has had diarrhea really bad all last night and this morning he is wondering if there is something the Dr. could call in. Translation No Nurse Assessment Nurse: Kathi Ludwig, RN, Leana Roe Date/Time (Eastern Time): 01/17/2019 7:56:41 AM Confirm and document reason for call. If symptomatic, describe symptoms. ---Caller states diarrhea, started last night. no fever. no vomiting Has the patient had close contact with a person known or suspected to have the novel coronavirus illness OR traveled / lives in area with major community spread (including international travel) in the last 14 days from the onset of symptoms? * If Asymptomatic, screen for exposure and travel within the last 14 days. ---No Does the patient have any new or worsening symptoms? ---Yes Will a triage be completed? ---Yes Related visit to physician within the last 2 weeks? ---No Does the PT have any chronic conditions? (i.e. diabetes, asthma, this includes High risk factors for pregnancy, etc.) ---No Is this a behavioral health or substance abuse call? ---No Guidelines Guideline Title Affirmed Question Affirmed Notes Nurse Date/Time  (Eastern Time) Diarrhea [1] SEVERE abdominal pain AND [2] age > 7 Kathi Ludwig, RN, Tracie 01/17/2019 7:57:25 AM Disp. Time Eilene Ghazi Time) Disposition Final User 01/17/2019 7:58:20 AM Go to ED Now Yes Kathi Ludwig, RN, Leana Roe PLEASE NOTE: All timestamps contained within this report are represented as [place] Standard Time. CONFIDENTIALTY NOTICE: This fax transmission is intended only for the addressee. It contains information that is legally privileged, confidential or otherwise protected from use or disclosure. If you are not the intended recipient, you are strictly prohibited from reviewing, disclosing, copying using or disseminating any of this information or taking any action in reliance on or regarding this information. If you have received this fax in error, please notify us immediately by telephone so that we can arrange for its return to [place]. Phone: [phone], Toll-Free: [phone], Fax: [phone] Page: 2 of 2 Call Id: [id number] [place] Disagree/Comply Comply Caller Understands Yes PreDisposition Did not know what to do Care Advice Given Per Guideline GO TO ED NOW: * You need to be seen in the Emergency Department. * Go to the ED at ___________ [place] now. Drive carefully. ANOTHER ADULT SHOULD DRIVE: BRING MEDICINES: CARE ADVICE given per Diarrhea (Adult) guideline. Referrals [facility] - ED

## 2019-01-17 NOTE — ED Triage Notes (Signed)
Pt here from home for evaluation of cough, abdominal pain, diarrhea, and shob x 24 hours. Denies sick contacts. Wears 4L O2 at all times.

## 2019-01-17 NOTE — Telephone Encounter (Signed)
Called [facility] at [phone], No answer and VM was full unable to leave message.

## 2019-01-17 NOTE — Telephone Encounter (Signed)
Please notify home health nurse, I believe her name was Claiborne Billings from [facility]. See phone note dated 01/15/19

## 2019-01-17 NOTE — Telephone Encounter (Signed)
Called [facility] at [phone], No answer and VM was full.

## 2019-01-17 NOTE — Telephone Encounter (Signed)
Denise [name], will you try calling patient's husband and home health on Monday January 18th?

## 2019-01-17 NOTE — Telephone Encounter (Signed)
[facility] called today in regards to referral they received from the office  They stated at this time they are not able to provide hospital bed for the patient and wanted to make sure you are aware  [place] Call back # [phone]

## 2019-01-17 NOTE — Telephone Encounter (Signed)
Noted. It appears that she has been triaged, labs pending, negative Covid-19 test.

## 2019-01-17 NOTE — ED Notes (Signed)
Patient verbalizes understanding of discharge instructions. Opportunity for questioning and answers were provided. Armband removed by staff, pt discharged from ED.  

## 2019-01-20 ENCOUNTER — Encounter: Payer: Self-pay | Admitting: Family Medicine

## 2019-01-20 ENCOUNTER — Ambulatory Visit (INDEPENDENT_AMBULATORY_CARE_PROVIDER_SITE_OTHER): Payer: Medicare HMO | Admitting: Family Medicine

## 2019-01-20 ENCOUNTER — Other Ambulatory Visit: Payer: Self-pay

## 2019-01-20 ENCOUNTER — Ambulatory Visit (INDEPENDENT_AMBULATORY_CARE_PROVIDER_SITE_OTHER): Payer: Medicare HMO | Admitting: *Deleted

## 2019-01-20 VITALS — HR 75 | Temp 96.7°F | Ht 66.0 in

## 2019-01-20 DIAGNOSIS — I63511 Cerebral infarction due to unspecified occlusion or stenosis of right middle cerebral artery: Secondary | ICD-10-CM | POA: Diagnosis not present

## 2019-01-20 DIAGNOSIS — F419 Anxiety disorder, unspecified: Secondary | ICD-10-CM | POA: Diagnosis not present

## 2019-01-20 DIAGNOSIS — J439 Emphysema, unspecified: Secondary | ICD-10-CM | POA: Diagnosis not present

## 2019-01-20 DIAGNOSIS — J9622 Acute and chronic respiratory failure with hypercapnia: Secondary | ICD-10-CM | POA: Diagnosis not present

## 2019-01-20 DIAGNOSIS — J9621 Acute and chronic respiratory failure with hypoxia: Secondary | ICD-10-CM

## 2019-01-20 DIAGNOSIS — E119 Type 2 diabetes mellitus without complications: Secondary | ICD-10-CM | POA: Diagnosis not present

## 2019-01-20 DIAGNOSIS — I110 Hypertensive heart disease with heart failure: Secondary | ICD-10-CM | POA: Diagnosis not present

## 2019-01-20 DIAGNOSIS — M25512 Pain in left shoulder: Secondary | ICD-10-CM | POA: Diagnosis not present

## 2019-01-20 DIAGNOSIS — I69354 Hemiplegia and hemiparesis following cerebral infarction affecting left non-dominant side: Secondary | ICD-10-CM | POA: Diagnosis not present

## 2019-01-20 DIAGNOSIS — I5023 Acute on chronic systolic (congestive) heart failure: Secondary | ICD-10-CM

## 2019-01-20 DIAGNOSIS — J9611 Chronic respiratory failure with hypoxia: Secondary | ICD-10-CM | POA: Diagnosis not present

## 2019-01-20 DIAGNOSIS — I5043 Acute on chronic combined systolic (congestive) and diastolic (congestive) heart failure: Secondary | ICD-10-CM | POA: Diagnosis not present

## 2019-01-20 DIAGNOSIS — I272 Pulmonary hypertension, unspecified: Secondary | ICD-10-CM | POA: Diagnosis not present

## 2019-01-20 NOTE — Progress Notes (Signed)
Denise [name] T. Naman Spychalski, MD Primary Care and Sports Medicine [facility] at [facility] [place], [address] Phone: [phone]  FAX: Denise [name]  MRN [id number]  Date of Birth: Sep 02, 1945  Visit Date: 01/20/2019  PCP: [name], NP  Referred by: [name], NP  Chief Complaint  Patient presents with  . Shoulder Pain    Follow up Left shoulder x-ray    This visit occurred during the SARS-CoV-2 public health emergency.  Safety protocols were in place, including screening questions prior to the visit, additional usage of staff PPE, and extensive cleaning of exam room while observing appropriate contact time as indicated for disinfecting solutions.   Subjective:   Denise [name] is a 74 y.o. very pleasant [name] patient with Body mass index is 18.56 kg/m. who presents with the following:  New consultation courtesy of [name].  This patient is a hospice patient.  Reviewed the case previously with [name].  She comes in after a fall and she had a left sided shoulder injury.  At baseline she is not able to lift this shoulder very well in the planes of abduction and flexion, but she is having some pain now.  Radiology noticed very small density laterally and was concerned that this may be a newer avulsion fracture.  She is in some mild pain.  Her husband is here as a secondary historian.  I pulled up the patient's x-rays and independently reviewed them with her myself.  There is evidence of a very small/tiny density lateral to the humeral head.  For comparison I pulled up the patient's prior CT of her chest from October 29, 2018.  It does appear to me on viewing the CT that she did have some prior radiopacity in this region. Electronically Signed  By: Owens Loffler, MD On: 01/20/2019 11:40 AM EST   Had a fall and   Past Medical History, Surgical History, Social History, Family  History, Problem List, Medications, and Allergies have been reviewed and updated if relevant.   GEN: No fevers, chills. Nontoxic. Primarily MSK c/o today. MSK: Detailed in the HPI GI: tolerating PO intake without difficulty Neuro: No numbness, parasthesias, or tingling associated. Otherwise the pertinent positives of the ROS are noted above.   Objective:   Pulse 75   Temp (!) 96.7 F (35.9 C) (Temporal)   Ht _0  (1.676 m)   SpO2 95% Comment: 4L O2  BMI 18.56 kg/m    GEN: WDWN, NAD, Non-toxic, Alert & Oriented x 3 HEENT: Atraumatic, Normocephalic.  Ears and Nose: No external deformity. EXTR: No clubbing/cyanosis/edema NEURO: She is in a wheelchair. PSYCH: Normally interactive. Conversant. Not depressed or anxious appearing.  Calm demeanor.    From a seated position, the patient's right shoulder is able to abduct to approximately 130 degrees.  Strength in abduction is 4 -/5.  Internal and external rotation is 5/5.  No significant tenderness with palpating the humeral head, biceps tendon, AC joint.  On the left shoulder the patient is able to abduct her shoulder to approximately 125 degrees with some pain.  She is able to flex her shoulder to about 130 degrees with some pain.  Internal and external rotation is 4+/5.  Abduction is 3+/5.  Radiology:  Assessment and Plan:     ICD-10-CM   1. Acute pain of left shoulder  M25.512   2. Acute on chronic respiratory failure with hypoxia (  [place])  J96.21   3. Acute on chronic systolic CHF (congestive heart failure) ([name])  I50.23    Level of Medical Decision-Making in this case is Moderate.   I am not sure of the clinical significance of the described findings, and this may relate to prior CT of the chest radiopacities.  She certainly has injured her shoulder.  In this clinical setting with the patient on hospice I think that basically keeping motion up would be all that she needs to do.  She already has a physical therapist working with  her, and I see no reason why this cannot continue.  Follow-up: No follow-ups on file.  No orders of the defined types were placed in this encounter.  No orders of the defined types were placed in this encounter.   Signed,  Maud Deed. Jamine Highfill, MD   Outpatient Encounter Medications as of 01/20/2019  Medication Sig  . albuterol (VENTOLIN HFA) 108 (90 Base) MCG/ACT inhaler INHALE 1-2 PUFFS INTO THE LUNGS EVERY 4 HOURS AS NEEDED FOR WHEEZING OR SHORTNESS OF BREATH. (Patient taking differently: Inhale 1-2 puffs into the lungs every 4 (four) hours as needed for wheezing or shortness of breath. )  . apixaban (ELIQUIS) 5 MG TABS tablet Take 1 tablet (5 mg total) by mouth 2 (two) times daily with a meal. For stroke prevention.  [name] atorvastatin (LIPITOR) 20 MG tablet Take 1 tablet (20 mg total) by mouth every evening. For cholesterol (Patient taking differently: Take 20 mg by mouth at bedtime. For cholesterol)  . Blood Glucose Calibration (ACCU-CHEK AVIVA) SOLN USE AS INSTRUCTED TO TEST BLOOD SUGAR DAILY (Patient taking differently: 1 each by Other route daily. )  . Blood Glucose Monitoring Suppl (ACCU-CHEK AVIVA PLUS) w/Device KIT Use as instructed to test blood sugar up to 3 times daily (Patient taking differently: 1 each by Other route 3 (three) times daily. )  . fluticasone furoate-vilanterol (BREO ELLIPTA) 100-25 MCG/INH AEPB Inhale 1 puff into the lungs daily.  . furosemide (LASIX) 40 MG tablet Take 40 mg by mouth daily.   [name] glipiZIDE (GLUCOTROL) 5 MG tablet Take 1 tablet (5 mg total) by mouth daily before breakfast.  . glucose blood (ACCU-CHEK AVIVA PLUS) test strip Use as instructed to test blood sugar up to 3 times daily  . Lancets (ACCU-CHEK SOFT TOUCH) lancets Use as instructed to test blood sugar up to 3 times daily (Patient taking differently: 1 each by Other route 3 (three) times daily. )  . metoprolol tartrate (LOPRESSOR) 25 MG tablet TAKE 0.5 TABLETS (12.5 MG TOTAL) BY MOUTH 2 (TWO)  TIMES DAILY. (Patient taking differently: Take 12.5 mg by mouth 2 (two) times daily with a meal. )  . mirtazapine (REMERON) 15 MG tablet TAKE 1 TABLET BY MOUTH AT BEDTIME. FOR DEPRESSION AND APPETITE. (Patient taking differently: Take 15 mg by mouth at bedtime. )  . OXYGEN Inhale 4 L into the lungs continuous.   . polyethylene glycol powder (GLYCOLAX/MIRALAX) 17 GM/SCOOP powder Take 17 g by mouth daily as needed for mild constipation (TO BE MIXED INTO 4-8 OUNCES OF LIQUID, THEN CONSUMED).   [name] potassium chloride (KLOR-CON M10) 10 MEQ tablet Take 1 tablet (10 mEq total) by mouth daily.  [name] spironolactone (ALDACTONE) 25 MG tablet Take 1 tablet (25 mg total) by mouth daily. Take at night (Patient taking differently: Take 25 mg by mouth at bedtime. )  . umeclidinium bromide (INCRUSE ELLIPTA) 62.5 MCG/INH AEPB Inhale 1 puff into the lungs daily.  [name] atorvastatin (LIPITOR)  10 MG tablet    No facility-administered encounter medications on file as of 01/20/2019.

## 2019-01-20 NOTE — Telephone Encounter (Signed)
Spoken to [place] from [facility] and notified of the information. Offer to call patient and husband, she stated that it is okay she will let them know. They will call us if needed.

## 2019-01-21 DIAGNOSIS — F419 Anxiety disorder, unspecified: Secondary | ICD-10-CM | POA: Diagnosis not present

## 2019-01-21 DIAGNOSIS — J9622 Acute and chronic respiratory failure with hypercapnia: Secondary | ICD-10-CM | POA: Diagnosis not present

## 2019-01-21 DIAGNOSIS — I5043 Acute on chronic combined systolic (congestive) and diastolic (congestive) heart failure: Secondary | ICD-10-CM | POA: Diagnosis not present

## 2019-01-21 DIAGNOSIS — J439 Emphysema, unspecified: Secondary | ICD-10-CM | POA: Diagnosis not present

## 2019-01-21 DIAGNOSIS — I110 Hypertensive heart disease with heart failure: Secondary | ICD-10-CM | POA: Diagnosis not present

## 2019-01-21 DIAGNOSIS — J9611 Chronic respiratory failure with hypoxia: Secondary | ICD-10-CM | POA: Diagnosis not present

## 2019-01-21 DIAGNOSIS — E119 Type 2 diabetes mellitus without complications: Secondary | ICD-10-CM | POA: Diagnosis not present

## 2019-01-21 DIAGNOSIS — I272 Pulmonary hypertension, unspecified: Secondary | ICD-10-CM | POA: Diagnosis not present

## 2019-01-21 DIAGNOSIS — I69354 Hemiplegia and hemiparesis following cerebral infarction affecting left non-dominant side: Secondary | ICD-10-CM | POA: Diagnosis not present

## 2019-01-21 LAB — CUP PACEART REMOTE DEVICE CHECK
Date Time Interrogation Session: 20210115072126
Implantable Pulse Generator Implant Date: 20201106

## 2019-01-21 NOTE — Progress Notes (Signed)
ILR remote

## 2019-01-22 ENCOUNTER — Telehealth: Payer: Self-pay | Admitting: Internal Medicine

## 2019-01-22 NOTE — Telephone Encounter (Signed)
Called patient's husband back to follow-up from previous phone call to see if they wanted to schedule the Palliative Consult, no answer - left message requesting a call back, left my contact information.

## 2019-01-23 ENCOUNTER — Other Ambulatory Visit (HOSPITAL_COMMUNITY): Payer: Self-pay | Admitting: *Deleted

## 2019-01-23 NOTE — Telephone Encounter (Signed)
Patient's Husband called today He stated he spoke with a nurse and they were going to be sending over a prescription to help the patient with stomach pains. He did not know what the name of the medication was but the pharmacy did not received the script Patient said the Eliquis was to expensive , And did not know what she was taking this for  Please advise

## 2019-01-24 ENCOUNTER — Other Ambulatory Visit (HOSPITAL_COMMUNITY): Payer: Self-pay | Admitting: Cardiology

## 2019-01-24 ENCOUNTER — Other Ambulatory Visit: Payer: Self-pay

## 2019-01-24 DIAGNOSIS — E876 Hypokalemia: Secondary | ICD-10-CM

## 2019-01-24 MED ORDER — FAMOTIDINE 40 MG PO TABS: ORAL_TABLET | ORAL | 0 refills | Status: DC

## 2019-01-24 NOTE — Telephone Encounter (Signed)
Refill request for Famotidine 40 mg  Last OV: 11/25/2018 Next OV: 05/26/2019  Last ordered by : Dr.Wert on 07/11/2018 Quantity: 180 tablets 0 refills Instructions: Take 1 tablet by mouth twice a day  Dr. Melvyn Novas please advise  No Known Allergies  Current Outpatient Medications on File Prior to Visit  Medication Sig Dispense Refill  . albuterol (VENTOLIN HFA) 108 (90 Base) MCG/ACT inhaler INHALE 1-2 PUFFS INTO THE LUNGS EVERY 4 HOURS AS NEEDED FOR WHEEZING OR SHORTNESS OF BREATH. (Patient taking differently: Inhale 1-2 puffs into the lungs every 4 (four) hours as needed for wheezing or shortness of breath. ) 6.7 g 5  . apixaban (ELIQUIS) 5 MG TABS tablet Take 1 tablet (5 mg total) by mouth 2 (two) times daily with a meal. For stroke prevention. 180 tablet 1  . atorvastatin (LIPITOR) 10 MG tablet     . atorvastatin (LIPITOR) 20 MG tablet Take 1 tablet (20 mg total) by mouth every evening. For cholesterol (Patient taking differently: Take 20 mg by mouth at bedtime. For cholesterol) 90 tablet 3  . Blood Glucose Calibration (ACCU-CHEK AVIVA) SOLN USE AS INSTRUCTED TO TEST BLOOD SUGAR DAILY (Patient taking differently: 1 each by Other route daily. ) 1 each 2  . Blood Glucose Monitoring Suppl (ACCU-CHEK AVIVA PLUS) w/Device KIT Use as instructed to test blood sugar up to 3 times daily (Patient taking differently: 1 each by Other route 3 (three) times daily. ) 1 kit 0  . fluticasone furoate-vilanterol (BREO ELLIPTA) 100-25 MCG/INH AEPB Inhale 1 puff into the lungs daily.    . furosemide (LASIX) 40 MG tablet Take 40 mg by mouth daily.     [name] glipiZIDE (GLUCOTROL) 5 MG tablet Take 1 tablet (5 mg total) by mouth daily before breakfast. 90 tablet 1  . glucose blood (ACCU-CHEK AVIVA PLUS) test strip Use as instructed to test blood sugar up to 3 times daily 300 each 1  . Lancets (ACCU-CHEK SOFT TOUCH) lancets Use as instructed to test blood sugar up to 3 times daily (Patient taking differently: 1 each by Other  route 3 (three) times daily. ) 300 each 1  . metoprolol tartrate (LOPRESSOR) 25 MG tablet TAKE 0.5 TABLETS (12.5 MG TOTAL) BY MOUTH 2 (TWO) TIMES DAILY. (Patient taking differently: Take 12.5 mg by mouth 2 (two) times daily with a meal. ) 90 tablet 0  . mirtazapine (REMERON) 15 MG tablet TAKE 1 TABLET BY MOUTH AT BEDTIME. FOR DEPRESSION AND APPETITE. (Patient taking differently: Take 15 mg by mouth at bedtime. ) 90 tablet 3  . OXYGEN Inhale 4 L into the lungs continuous.     . polyethylene glycol powder (GLYCOLAX/MIRALAX) 17 GM/SCOOP powder Take 17 g by mouth daily as needed for mild constipation (TO BE MIXED INTO 4-8 OUNCES OF LIQUID, THEN CONSUMED).     [name] potassium chloride (KLOR-CON M10) 10 MEQ tablet Take 1 tablet (10 mEq total) by mouth daily. 30 tablet 0  . spironolactone (ALDACTONE) 25 MG tablet Take 1 tablet (25 mg total) by mouth daily. Take at night (Patient taking differently: Take 25 mg by mouth at bedtime. ) 30 tablet 0  . umeclidinium bromide (INCRUSE ELLIPTA) 62.5 MCG/INH AEPB Inhale 1 puff into the lungs daily.     No current facility-administered medications on file prior to visit.

## 2019-01-24 NOTE — Telephone Encounter (Signed)
This is a CHF pt 

## 2019-01-24 NOTE — Telephone Encounter (Signed)
Please notify patient's husband that I don't know what he's talking about in regards to medication for her abdominal pain. I see that the palliative care nurse has been trying to contact them.   The Eliquis medication is to be taken to prevent another stroke, it's very important to take this. If it's too expensive we can try sending in [place].   Please make sure he understands as we receive numerous calls about confusion with medications despite explaining medications in detail during each visit.

## 2019-01-27 ENCOUNTER — Telehealth: Payer: Self-pay | Admitting: Internal Medicine

## 2019-01-27 ENCOUNTER — Telehealth: Payer: Self-pay | Admitting: *Deleted

## 2019-01-27 ENCOUNTER — Telehealth: Payer: Self-pay | Admitting: Primary Care

## 2019-01-27 DIAGNOSIS — J9611 Chronic respiratory failure with hypoxia: Secondary | ICD-10-CM | POA: Diagnosis not present

## 2019-01-27 DIAGNOSIS — J9622 Acute and chronic respiratory failure with hypercapnia: Secondary | ICD-10-CM | POA: Diagnosis not present

## 2019-01-27 DIAGNOSIS — J439 Emphysema, unspecified: Secondary | ICD-10-CM | POA: Diagnosis not present

## 2019-01-27 DIAGNOSIS — I5043 Acute on chronic combined systolic (congestive) and diastolic (congestive) heart failure: Secondary | ICD-10-CM | POA: Diagnosis not present

## 2019-01-27 DIAGNOSIS — I69354 Hemiplegia and hemiparesis following cerebral infarction affecting left non-dominant side: Secondary | ICD-10-CM | POA: Diagnosis not present

## 2019-01-27 DIAGNOSIS — E119 Type 2 diabetes mellitus without complications: Secondary | ICD-10-CM | POA: Diagnosis not present

## 2019-01-27 DIAGNOSIS — I110 Hypertensive heart disease with heart failure: Secondary | ICD-10-CM | POA: Diagnosis not present

## 2019-01-27 DIAGNOSIS — I272 Pulmonary hypertension, unspecified: Secondary | ICD-10-CM | POA: Diagnosis not present

## 2019-01-27 DIAGNOSIS — F419 Anxiety disorder, unspecified: Secondary | ICD-10-CM | POA: Diagnosis not present

## 2019-01-27 MED ORDER — RIVAROXABAN 10 MG PO TABS: 10.0000 mg | ORAL_TABLET | Freq: Every day | ORAL | 3 refills | Status: DC

## 2019-01-27 NOTE — Telephone Encounter (Signed)
Pt called to let you know he went to pick up rx  eliquis and it was $135 he wanted to let you know he can not afford this.  And what this med is for.  Pt is still have stomach pain after eating and he was checking on rx that was sent this for   Has it been sent  Spouse stated he doesn't done what med it is.    [name]

## 2019-01-27 NOTE — Telephone Encounter (Signed)
Spoke with patient's husband Chalmers Cater to see if he was able to speak with family members about Palliative services and he said no, he was unable to get together with them.  He stated that he was not from [place] and he did not understand what I was talking about and he said that it would be okay if I spoke with patient's sister - Laverne Long, he said that he would look her number up and call me back to give it to me.  1:30 PM:  Called patient's sister Otilio Saber to talk with her about Palliative services, no answer left message with my contact information.

## 2019-01-27 NOTE — Telephone Encounter (Signed)
Noted. Addressed in telephone encounter 01/17/2019

## 2019-01-27 NOTE — Telephone Encounter (Signed)
Spoke with Margaretha Sheffield, patient is on 4 liters of oxygen. Pulse is 82-last time checked.

## 2019-01-27 NOTE — Telephone Encounter (Signed)
Margaretha Sheffield PT with [facility] left a voicemail stating that she was out to see the patient and she is out of their perimeters. Margaretha Sheffield stated that her respirations are 32 breaths per minute and she is on continuous oxygen. Margaretha Sheffield stated that she has a history of COPD, CHF and CVA.

## 2019-01-27 NOTE — Telephone Encounter (Signed)
Please call patient to check on symptoms.  Does she feel short of breath? Is she checking pulse oxygen levels? If so then what are her readings? Has she contacted her lung doctor?

## 2019-01-27 NOTE — Telephone Encounter (Signed)
Denise [name] 1 hour ago (10:11 AM)   Pt called to let you know he went to pick up rx  eliquis and it was $135 he wanted to let you know he can not afford this.  And what this med is for.   Pt is still have stomach pain after eating and he was checking on rx that was sent this for   Has it been sent  Spouse stated he doesn't done what med it is.     [name]

## 2019-01-27 NOTE — Telephone Encounter (Signed)
Based off of the neurologists note dated 12/02/18 the plan was to stop aspirin and clopidogrel and transition to [place] in the form of Eliquis. This was dosed per pharmacy, see note on 12/03/18. Eliquis use was mentioned again during her most recent hospital admission on 12/30/18.   Patient never filled Rx for Eliquis when prescribed until just recently and is now not affordable. Patient has also been off of aspirin and clopidogrel which places her at very high risk for recurrent stroke. Will initiate Xarelto 10 mg. Creatinine clearance of 43.   Vallarie Mare, please call patient and her husband, notify them to start Xarelto 10 mg once daily for stroke prevention. Please also ensure that she is not taking aspirin and clopidogrel (Plavix). I also see in notes from her hospital stay that she is to see a neurologist for her recurrent strokes, do they have a neurologist yet?

## 2019-01-27 NOTE — Telephone Encounter (Signed)
Spoken to patient's husband of Denise [name]'s comments below. Patient's husband stated if we can sent Xaralto to see if it is cheaper.  He is going to call the palliative care nurse regarding the stomach medication then will let us know.

## 2019-01-27 NOTE — Addendum Note (Signed)
Addended by: [name] on: 01/27/2019 12:55 PM   Modules accepted: Orders

## 2019-01-28 ENCOUNTER — Telehealth: Payer: Self-pay

## 2019-01-28 ENCOUNTER — Other Ambulatory Visit: Payer: Self-pay | Admitting: Internal Medicine

## 2019-01-28 DIAGNOSIS — I272 Pulmonary hypertension, unspecified: Secondary | ICD-10-CM | POA: Diagnosis not present

## 2019-01-28 DIAGNOSIS — J439 Emphysema, unspecified: Secondary | ICD-10-CM | POA: Diagnosis not present

## 2019-01-28 DIAGNOSIS — I69354 Hemiplegia and hemiparesis following cerebral infarction affecting left non-dominant side: Secondary | ICD-10-CM | POA: Diagnosis not present

## 2019-01-28 DIAGNOSIS — I110 Hypertensive heart disease with heart failure: Secondary | ICD-10-CM | POA: Diagnosis not present

## 2019-01-28 DIAGNOSIS — J9611 Chronic respiratory failure with hypoxia: Secondary | ICD-10-CM | POA: Diagnosis not present

## 2019-01-28 DIAGNOSIS — E119 Type 2 diabetes mellitus without complications: Secondary | ICD-10-CM | POA: Diagnosis not present

## 2019-01-28 DIAGNOSIS — F419 Anxiety disorder, unspecified: Secondary | ICD-10-CM | POA: Diagnosis not present

## 2019-01-28 DIAGNOSIS — J9622 Acute and chronic respiratory failure with hypercapnia: Secondary | ICD-10-CM | POA: Diagnosis not present

## 2019-01-28 DIAGNOSIS — I5043 Acute on chronic combined systolic (congestive) and diastolic (congestive) heart failure: Secondary | ICD-10-CM | POA: Diagnosis not present

## 2019-01-28 MED ORDER — FAMOTIDINE 40 MG PO TABS: ORAL_TABLET | ORAL | 0 refills | Status: AC

## 2019-01-28 NOTE — Addendum Note (Signed)
Addended by: [name] on: 01/28/2019 04:23 PM   Modules accepted: Orders

## 2019-01-28 NOTE — Telephone Encounter (Signed)
Spoken to patient and patient's husband.  She stated SOB is okay now. Last pulse ox was 92.  Patient's husband stated that he will call Dr Gustavus Bryant office.

## 2019-01-28 NOTE — Telephone Encounter (Signed)
Noted, glad to know that her breathing is better.

## 2019-01-28 NOTE — Telephone Encounter (Signed)
Noted, referral placed.  

## 2019-01-28 NOTE — Telephone Encounter (Signed)
Spoken and notified patient of [name] comments. Patient and husband verbalized understanding. Please place a referral for a neurologist, she does not have one.

## 2019-01-28 NOTE — Telephone Encounter (Signed)
Faxed as requested.   Claiborne Billings has been notified.

## 2019-01-28 NOTE — Telephone Encounter (Signed)
Claiborne Billings nurse with [facility] left v/m; the hospital bed rx was sent and was denied. Kelly request hospital bed rx be faxed to [facility] fax # [phone] and Claiborne Billings will send to another site or facility. Kelly request cb when done.

## 2019-01-29 ENCOUNTER — Other Ambulatory Visit (HOSPITAL_COMMUNITY): Payer: Self-pay

## 2019-01-29 DIAGNOSIS — J9611 Chronic respiratory failure with hypoxia: Secondary | ICD-10-CM | POA: Diagnosis not present

## 2019-01-29 MED ORDER — SPIRONOLACTONE 25 MG PO TABS: 25.0000 mg | ORAL_TABLET | Freq: Every day | ORAL | 3 refills | Status: DC

## 2019-01-30 ENCOUNTER — Other Ambulatory Visit (HOSPITAL_COMMUNITY): Payer: Self-pay

## 2019-01-30 ENCOUNTER — Telehealth: Payer: Self-pay | Admitting: Internal Medicine

## 2019-01-30 DIAGNOSIS — R600 Localized edema: Secondary | ICD-10-CM | POA: Diagnosis not present

## 2019-01-30 DIAGNOSIS — E43 Unspecified severe protein-calorie malnutrition: Secondary | ICD-10-CM | POA: Diagnosis not present

## 2019-01-30 DIAGNOSIS — J439 Emphysema, unspecified: Secondary | ICD-10-CM | POA: Diagnosis not present

## 2019-01-30 DIAGNOSIS — I509 Heart failure, unspecified: Secondary | ICD-10-CM | POA: Diagnosis not present

## 2019-01-30 DIAGNOSIS — I272 Pulmonary hypertension, unspecified: Secondary | ICD-10-CM | POA: Diagnosis not present

## 2019-01-30 DIAGNOSIS — J449 Chronic obstructive pulmonary disease, unspecified: Secondary | ICD-10-CM | POA: Diagnosis not present

## 2019-01-30 DIAGNOSIS — J9612 Chronic respiratory failure with hypercapnia: Secondary | ICD-10-CM | POA: Diagnosis not present

## 2019-01-30 DIAGNOSIS — E119 Type 2 diabetes mellitus without complications: Secondary | ICD-10-CM | POA: Diagnosis not present

## 2019-01-30 DIAGNOSIS — J9611 Chronic respiratory failure with hypoxia: Secondary | ICD-10-CM | POA: Diagnosis not present

## 2019-01-30 MED ORDER — SPIRONOLACTONE 25 MG PO TABS: 25.0000 mg | ORAL_TABLET | Freq: Every day | ORAL | 3 refills | Status: DC

## 2019-01-30 NOTE — Telephone Encounter (Signed)
Spoke with patient's sister Otilio Saber (as requested by husband) regarding Palliative services.  She wanted to call me back and set up a conference call with her other sister Parke Simmers as well as the patient's husband.  Rec'd call back and spoke with everyone mentioned above on a conference call regarding Palliative services and all questions were answered and all were in agreement.  I have scheduled a Telephone Consult for 02/07/19 @ 10:30 AM.

## 2019-01-31 ENCOUNTER — Other Ambulatory Visit: Payer: Self-pay

## 2019-01-31 NOTE — Patient Outreach (Signed)
First telephone outreach attempt to obtain mRs. No answer. Left message for returned call.   [facility] Management Assistant

## 2019-02-02 DIAGNOSIS — J9611 Chronic respiratory failure with hypoxia: Secondary | ICD-10-CM | POA: Diagnosis not present

## 2019-02-03 ENCOUNTER — Telehealth: Payer: Self-pay | Admitting: Primary Care

## 2019-02-03 DIAGNOSIS — I5043 Acute on chronic combined systolic (congestive) and diastolic (congestive) heart failure: Secondary | ICD-10-CM | POA: Diagnosis not present

## 2019-02-03 DIAGNOSIS — J9622 Acute and chronic respiratory failure with hypercapnia: Secondary | ICD-10-CM | POA: Diagnosis not present

## 2019-02-03 DIAGNOSIS — I110 Hypertensive heart disease with heart failure: Secondary | ICD-10-CM | POA: Diagnosis not present

## 2019-02-03 DIAGNOSIS — E119 Type 2 diabetes mellitus without complications: Secondary | ICD-10-CM | POA: Diagnosis not present

## 2019-02-03 DIAGNOSIS — J439 Emphysema, unspecified: Secondary | ICD-10-CM | POA: Diagnosis not present

## 2019-02-03 DIAGNOSIS — F419 Anxiety disorder, unspecified: Secondary | ICD-10-CM | POA: Diagnosis not present

## 2019-02-03 DIAGNOSIS — I69354 Hemiplegia and hemiparesis following cerebral infarction affecting left non-dominant side: Secondary | ICD-10-CM | POA: Diagnosis not present

## 2019-02-03 DIAGNOSIS — J9611 Chronic respiratory failure with hypoxia: Secondary | ICD-10-CM | POA: Diagnosis not present

## 2019-02-03 DIAGNOSIS — I272 Pulmonary hypertension, unspecified: Secondary | ICD-10-CM | POA: Diagnosis not present

## 2019-02-03 NOTE — Telephone Encounter (Signed)
Patient's husband Chalmers Cater called. He wanted to know if you think it is a good idea for the patient to get the covid vaccine. Please advise

## 2019-02-03 NOTE — Telephone Encounter (Signed)
Please notify patient's husband that I believe it would be reasonable. The potential benefits of vaccination outweight the risks of potential effects of Covid-19 infection. They can also check with the pulmonologist or cardiologist.

## 2019-02-04 DIAGNOSIS — J9611 Chronic respiratory failure with hypoxia: Secondary | ICD-10-CM | POA: Diagnosis not present

## 2019-02-04 DIAGNOSIS — J9622 Acute and chronic respiratory failure with hypercapnia: Secondary | ICD-10-CM | POA: Diagnosis not present

## 2019-02-04 DIAGNOSIS — I69354 Hemiplegia and hemiparesis following cerebral infarction affecting left non-dominant side: Secondary | ICD-10-CM | POA: Diagnosis not present

## 2019-02-04 DIAGNOSIS — I272 Pulmonary hypertension, unspecified: Secondary | ICD-10-CM | POA: Diagnosis not present

## 2019-02-04 DIAGNOSIS — J439 Emphysema, unspecified: Secondary | ICD-10-CM | POA: Diagnosis not present

## 2019-02-04 DIAGNOSIS — I110 Hypertensive heart disease with heart failure: Secondary | ICD-10-CM | POA: Diagnosis not present

## 2019-02-04 DIAGNOSIS — F419 Anxiety disorder, unspecified: Secondary | ICD-10-CM | POA: Diagnosis not present

## 2019-02-04 DIAGNOSIS — E119 Type 2 diabetes mellitus without complications: Secondary | ICD-10-CM | POA: Diagnosis not present

## 2019-02-04 DIAGNOSIS — I5043 Acute on chronic combined systolic (congestive) and diastolic (congestive) heart failure: Secondary | ICD-10-CM | POA: Diagnosis not present

## 2019-02-04 NOTE — Telephone Encounter (Signed)
Patient's husband Chalmers Cater has been notified of Kate's comments and verbalized understanding.

## 2019-02-05 ENCOUNTER — Telehealth (HOSPITAL_COMMUNITY): Payer: Self-pay

## 2019-02-05 DIAGNOSIS — J439 Emphysema, unspecified: Secondary | ICD-10-CM | POA: Diagnosis not present

## 2019-02-05 DIAGNOSIS — I110 Hypertensive heart disease with heart failure: Secondary | ICD-10-CM | POA: Diagnosis not present

## 2019-02-05 DIAGNOSIS — E119 Type 2 diabetes mellitus without complications: Secondary | ICD-10-CM | POA: Diagnosis not present

## 2019-02-05 DIAGNOSIS — J9611 Chronic respiratory failure with hypoxia: Secondary | ICD-10-CM | POA: Diagnosis not present

## 2019-02-05 DIAGNOSIS — I5043 Acute on chronic combined systolic (congestive) and diastolic (congestive) heart failure: Secondary | ICD-10-CM | POA: Diagnosis not present

## 2019-02-05 DIAGNOSIS — J9622 Acute and chronic respiratory failure with hypercapnia: Secondary | ICD-10-CM | POA: Diagnosis not present

## 2019-02-05 DIAGNOSIS — I272 Pulmonary hypertension, unspecified: Secondary | ICD-10-CM | POA: Diagnosis not present

## 2019-02-05 DIAGNOSIS — I69354 Hemiplegia and hemiparesis following cerebral infarction affecting left non-dominant side: Secondary | ICD-10-CM | POA: Diagnosis not present

## 2019-02-05 DIAGNOSIS — F419 Anxiety disorder, unspecified: Secondary | ICD-10-CM | POA: Diagnosis not present

## 2019-02-05 NOTE — Telephone Encounter (Signed)
I have contacted [name]'s husband and advised of Dr. Claris Gladden response in regards to Denise [name] having the COVID vaccine administered. He understood the advice given and stated he would proceed with having it administered on Saturday 02/08/2019.

## 2019-02-05 NOTE — Telephone Encounter (Signed)
-----  Message from Larey Dresser, MD sent at 02/05/2019  4:12 PM EST ----- Regarding: RE: COVID Vaccine Inquiry Yes, want her to get it.  ----- Message ----- From: Nataya Bastedo S Sent: 02/05/2019   2:34 PM EST To: Larey Dresser, MD Subject: COVID Vaccine Inquiry                          Good Afternoon!  Mrs. Cremeens's husband contacted our office to inquire if it would be okay for Nelani to receive the COVID vaccine.  She is scheduled for her first dose this coming Saturday, but her husband wanted to receive clearance from you before proceeding with the appointment.  Please advise!  Thanks, Barnett Applebaum

## 2019-02-07 ENCOUNTER — Other Ambulatory Visit: Payer: Self-pay

## 2019-02-07 ENCOUNTER — Other Ambulatory Visit: Payer: Medicare HMO | Admitting: Internal Medicine

## 2019-02-07 DIAGNOSIS — R0602 Shortness of breath: Secondary | ICD-10-CM

## 2019-02-07 DIAGNOSIS — Z515 Encounter for palliative care: Secondary | ICD-10-CM

## 2019-02-07 NOTE — Patient Outreach (Signed)
Telephone outreach to patient to obtain mRS was successfully completed. MRS=3   [facility]

## 2019-02-07 NOTE — Progress Notes (Signed)
[facility] Consult Note Telephone: [phone]  Fax: [phone]  PATIENT NAME: Denise [name] DOB: 1945-10-24 MRN: [id number]  PRIMARY CARE PROVIDER:   [name], NP  REFERRING PROVIDER:  [name], NP [place] [place],  [place] [address]  RESPONSIBLE PARTY:   Self and husband, Denise [name] [phone]      RECOMMENDATIONS and PLAN:  Palliative Care Encounter  Z51.5  1.  Advance Care Planning:  Reviewed Palliative care with pt, spouse and sister.  Goals of care were reviewed which include improvement of strength and weight, stabalization of breathing and avoid re-hospitalization.  Advanced directives were discussed with selections of Full scope of treatment with attempt of CPR and feeding tube.  IV and antibiotic therapy if indicated.  I will complete and mail a MOST form to patient for her records at home.   2.  Shortness of breath:  Related to advanced COPD and upon exertion. .  Consider use of nebulizer treatments to avoid exacerbations.  Disscuss with PCP and/or pulmonologist  3.  Protein calorie malnutrition:  Increased work of breathing will affect desire to eat.  Continue Remeron 55m nightly.  6 small meals per day and supplement with Glucerna or Boost 2-3 times per day as tolerated.  Monitor blood glucose levels  Due to the COVID-19 crisis, this visit was performed telephonically and was initiated and consented by patient and or family.  I spent 45 minutes providing this consultation,  from 1030 to 1115. More than 50% of the time in this consultation was spent coordinating communication with patient, husband and sister.  Medical records reviewed  HISTORY OF PRESENT ILLNESS:  Denise PRYis a 74y.o. year old female with multiple medical problems including COPD with hypoxemia, oxygen dependent, CVA 11/2018, Type 2 diabetes.  Pt. And husband report shortness of breath upon exertion and  fatigue.  She has had several hospitalizations in 2020 related to respiratory failure and CVAs with rehab admission. Reports of poor appetite normally when short of breath.  She does require assistance with ADLs.  Palliative Care was asked to help address goals of care.   CODE STATUS: FULL CODE  PPS: 40% HOSPICE ELIGIBILITY/DIAGNOSIS: TBD  PAST MEDICAL HISTORY:  Past Medical History:  Diagnosis Date  . Altered mental status   . Anxiety and depression   . Chronic respiratory failure with hypoxia ([place] 10/02/2017   Assoc with ? Cor pulmonale dx 09/2017 so rx 02 1-2 lpm 24/7  - 10/17/2017  Walked RA x 3 laps @ 185 ft each stopped due to  End of study, nl pace,  desat to 87 on 3rd lap - 12/19/2017  Saturations on Room Air at Rest =91 % and while Ambulating = 87%  But  on  2 Liters of pulsed oxygen while Ambulating =93% so rec POC 2lpm walking / use at rest if sats under 90%      . Colon cancer [place] [place]   Resected  . COPD (chronic obstructive pulmonary disease) ([facility]   . Diabetes mellitus without complication ([name])    diet controlled- no meds. per pt  . Diarrhea 04/02/2018  . Diverticulosis   . Hyperlipidemia   . Hypertension   . Hypoxia 10/30/2018  . Insomnia   . Primary hyperparathyroidism ([facility]   . SBO (small bowel obstruction) ([facility]   . Stroke ([facility] 10/2018   tpa administered  . Tubular adenoma of rectum 02/23/2013   low grade  .  Vitamin D deficiency      PERTINENT MEDICATIONS:  Outpatient Encounter Medications as of 02/07/2019  Medication Sig  . albuterol (VENTOLIN HFA) 108 (90 Base) MCG/ACT inhaler INHALE 1-2 PUFFS INTO THE LUNGS EVERY 4 HOURS AS NEEDED FOR WHEEZING OR SHORTNESS OF BREATH. (Patient taking differently: Inhale 1-2 puffs into the lungs every 4 (four) hours as needed for wheezing or shortness of breath. )  . atorvastatin (LIPITOR) 10 MG tablet   . atorvastatin (LIPITOR) 20 MG tablet Take 1 tablet (20 mg total) by mouth every evening. For cholesterol (Patient taking  differently: Take 20 mg by mouth at bedtime. For cholesterol)  . Blood Glucose Calibration (ACCU-CHEK AVIVA) SOLN USE AS INSTRUCTED TO TEST BLOOD SUGAR DAILY (Patient taking differently: 1 each by Other route daily. )  . Blood Glucose Monitoring Suppl (ACCU-CHEK AVIVA PLUS) w/Device KIT Use as instructed to test blood sugar up to 3 times daily (Patient taking differently: 1 each by Other route 3 (three) times daily. )  . famotidine (PEPCID) 40 MG tablet Take 1 tablet twice  . fluticasone furoate-vilanterol (BREO ELLIPTA) 100-25 MCG/INH AEPB Inhale 1 puff into the lungs daily.  . furosemide (LASIX) 40 MG tablet Take 40 mg by mouth daily.   [name] glipiZIDE (GLUCOTROL) 5 MG tablet Take 1 tablet (5 mg total) by mouth daily before breakfast.  . glucose blood (ACCU-CHEK AVIVA PLUS) test strip Use as instructed to test blood sugar up to 3 times daily  . KLOR-CON M10 10 MEQ tablet TAKE 2 TABLETS (20 MEQ TOTAL) BY MOUTH DAILY. FOR LOW POTASSIUM.  [name] Lancets (ACCU-CHEK SOFT TOUCH) lancets Use as instructed to test blood sugar up to 3 times daily (Patient taking differently: 1 each by Other route 3 (three) times daily. )  . metoprolol tartrate (LOPRESSOR) 25 MG tablet TAKE 0.5 TABLETS (12.5 MG TOTAL) BY MOUTH 2 (TWO) TIMES DAILY. (Patient taking differently: Take 12.5 mg by mouth 2 (two) times daily with a meal. )  . mirtazapine (REMERON) 15 MG tablet TAKE 1 TABLET BY MOUTH AT BEDTIME. FOR DEPRESSION AND APPETITE. (Patient taking differently: Take 15 mg by mouth at bedtime. )  . OXYGEN Inhale 4 L into the lungs continuous.   . polyethylene glycol powder (GLYCOLAX/MIRALAX) 17 GM/SCOOP powder Take 17 g by mouth daily as needed for mild constipation (TO BE MIXED INTO 4-8 OUNCES OF LIQUID, THEN CONSUMED).   . rivaroxaban (XARELTO) 10 MG TABS tablet Take 1 tablet (10 mg total) by mouth daily. For stroke prevention.  [name] spironolactone (ALDACTONE) 25 MG tablet Take 1 tablet (25 mg total) by mouth daily. Take at night  .  umeclidinium bromide (INCRUSE ELLIPTA) 62.5 MCG/INH AEPB Inhale 1 puff into the lungs daily.   No facility-administered encounter medications on file as of 02/07/2019.    PHYSICAL EXAM:   General: NAD per telephone conversation Cardiovascular:unable to assess Pulmonary: speech is not forced Neurological: A&O to person and place  Gonzella Lex, NP-C

## 2019-02-11 ENCOUNTER — Telehealth (HOSPITAL_COMMUNITY): Payer: Self-pay

## 2019-02-11 DIAGNOSIS — J439 Emphysema, unspecified: Secondary | ICD-10-CM | POA: Diagnosis not present

## 2019-02-11 DIAGNOSIS — I5043 Acute on chronic combined systolic (congestive) and diastolic (congestive) heart failure: Secondary | ICD-10-CM | POA: Diagnosis not present

## 2019-02-11 DIAGNOSIS — I272 Pulmonary hypertension, unspecified: Secondary | ICD-10-CM | POA: Diagnosis not present

## 2019-02-11 DIAGNOSIS — F419 Anxiety disorder, unspecified: Secondary | ICD-10-CM | POA: Diagnosis not present

## 2019-02-11 DIAGNOSIS — I110 Hypertensive heart disease with heart failure: Secondary | ICD-10-CM | POA: Diagnosis not present

## 2019-02-11 DIAGNOSIS — E119 Type 2 diabetes mellitus without complications: Secondary | ICD-10-CM | POA: Diagnosis not present

## 2019-02-11 DIAGNOSIS — J9622 Acute and chronic respiratory failure with hypercapnia: Secondary | ICD-10-CM | POA: Diagnosis not present

## 2019-02-11 DIAGNOSIS — I69354 Hemiplegia and hemiparesis following cerebral infarction affecting left non-dominant side: Secondary | ICD-10-CM | POA: Diagnosis not present

## 2019-02-11 DIAGNOSIS — J9611 Chronic respiratory failure with hypoxia: Secondary | ICD-10-CM | POA: Diagnosis not present

## 2019-02-11 NOTE — Telephone Encounter (Signed)
Left message for patient and or patient's husband to call me back in reference to scheduling home visit for tomorrow. Awaiting phone call back, will continue to follow.

## 2019-02-12 ENCOUNTER — Other Ambulatory Visit (HOSPITAL_COMMUNITY): Payer: Self-pay

## 2019-02-12 NOTE — Progress Notes (Signed)
Paramedicine Encounter    Patient ID: Denise [name], female    DOB: 27-Jun-1945, 74 y.o.   MRN: [id number]   Patient Care Team: [name], NP as PCP - General (Internal Medicine) [name], MD as Consulting Physician (Cardiology)  Patient Active Problem List   Diagnosis Date Noted  . Acute on chronic respiratory failure with hypoxia ([place]) 12/30/2018  . Acute on chronic diastolic CHF (congestive heart failure) ([place]) 12/30/2018  . Acute ischemic right MCA stroke ([place]) 11/27/2018  . Vaginal candidiasis   . Acute blood loss anemia   . Supplemental oxygen dependent   . Uncontrolled type 2 diabetes mellitus with hyperglycemia ([place])   . ICH (intracerebral hemorrhage) ([place]) s/p tPA administration 11/08/2018  . Hypertensive urgency 11/08/2018  . Cerebellar stroke, acute ([place]) 11/08/2018  . History of ischemic stroke 11/04/2018  . COPD with acute exacerbation ([place]) 10/28/2018  . Acute on chronic systolic CHF (congestive heart failure) ([place]) 10/28/2018  . Elevated troponin 10/28/2018  . Lactic acidosis 10/28/2018  . GERD (gastroesophageal reflux disease) 10/28/2018  . Acute on chronic respiratory failure with hypercapnia ([place]) 10/28/2018  . Diabetes mellitus without complication-diet controled 10/28/2018  . On home oxygen therapy 07/12/2018  . CHF (congestive heart failure) ([place]) 05/16/2018  . Chronic respiratory failure with hypoxia and hypercapnia ([place]) 05/03/2018  . Borderline abnormal TFTs 05/02/2018  . Type 2 diabetes mellitus ([place]) 04/26/2018  . Unintended weight loss 04/26/2018  . Lower extremity edema 02/20/2018  . Chronic respiratory failure with hypoxia ([place]) 10/02/2017  . Hematuria 09/27/2017  . Pulmonary hypertension ([name]) c/w cor pulmonale   . Protein-calorie malnutrition, severe 09/17/2017  . Constipation   . Hyperparathyroidism, primary ([place]) 07/27/2016  . Loss of appetite 06/02/2016  . COPD GOLD  II  spirometry, severe Emphysema and 02 dep  11/22/2015  . Vitamin D deficiency 11/22/2015  . Anxiety and depression 11/22/2015  . Insomnia 11/22/2015  . Hyperlipidemia 11/22/2015  . Essential hypertension 11/22/2015  . Hx of colonic polyps 02/10/2013  . History of colon cancer 06/11/1986    Current Outpatient Medications:  .  albuterol (VENTOLIN HFA) 108 (90 Base) MCG/ACT inhaler, INHALE 1-2 PUFFS INTO THE LUNGS EVERY 4 HOURS AS NEEDED FOR WHEEZING OR SHORTNESS OF BREATH. (Patient taking differently: Inhale 1-2 puffs into the lungs every 4 (four) hours as needed for wheezing or shortness of breath. ), Disp: 6.7 g, Rfl: 5 .  atorvastatin (LIPITOR) 10 MG tablet, , Disp: , Rfl:  .  atorvastatin (LIPITOR) 20 MG tablet, Take 1 tablet (20 mg total) by mouth every evening. For cholesterol (Patient taking differently: Take 20 mg by mouth at bedtime. For cholesterol), Disp: 90 tablet, Rfl: 3 .  Blood Glucose Calibration (ACCU-CHEK AVIVA) SOLN, USE AS INSTRUCTED TO TEST BLOOD SUGAR DAILY (Patient taking differently: 1 each by Other route daily. ), Disp: 1 each, Rfl: 2 .  Blood Glucose Monitoring Suppl (ACCU-CHEK AVIVA PLUS) w/Device KIT, Use as instructed to test blood sugar up to 3 times daily (Patient taking differently: 1 each by Other route 3 (three) times daily. ), Disp: 1 kit, Rfl: 0 .  famotidine (PEPCID) 40 MG tablet, Take 1 tablet twice, Disp: 180 tablet, Rfl: 0 .  fluticasone furoate-vilanterol (BREO ELLIPTA) 100-25 MCG/INH AEPB, Inhale 1 puff into the lungs daily., Disp: , Rfl:  .  furosemide (LASIX) 40 MG tablet, Take 40 mg by mouth daily. , Disp: , Rfl:  .  glipiZIDE (GLUCOTROL) 5 MG tablet, Take 1 tablet (5 mg total) by  mouth daily before breakfast., Disp: 90 tablet, Rfl: 1 .  glucose blood (ACCU-CHEK AVIVA PLUS) test strip, Use as instructed to test blood sugar up to 3 times daily, Disp: 300 each, Rfl: 1 .  KLOR-CON M10 10 MEQ tablet, TAKE 2 TABLETS (20 MEQ TOTAL) BY MOUTH DAILY. FOR LOW POTASSIUM., Disp: 180 tablet, Rfl: 1 .   Lancets (ACCU-CHEK SOFT TOUCH) lancets, Use as instructed to test blood sugar up to 3 times daily (Patient taking differently: 1 each by Other route 3 (three) times daily. ), Disp: 300 each, Rfl: 1 .  metoprolol tartrate (LOPRESSOR) 25 MG tablet, TAKE 0.5 TABLETS (12.5 MG TOTAL) BY MOUTH 2 (TWO) TIMES DAILY. (Patient taking differently: Take 12.5 mg by mouth 2 (two) times daily with a meal. ), Disp: 90 tablet, Rfl: 0 .  mirtazapine (REMERON) 15 MG tablet, TAKE 1 TABLET BY MOUTH AT BEDTIME. FOR DEPRESSION AND APPETITE. (Patient taking differently: Take 15 mg by mouth at bedtime. ), Disp: 90 tablet, Rfl: 3 .  OXYGEN, Inhale 4 L into the lungs continuous. , Disp: , Rfl:  .  polyethylene glycol powder (GLYCOLAX/MIRALAX) 17 GM/SCOOP powder, Take 17 g by mouth daily as needed for mild constipation (TO BE MIXED INTO 4-8 OUNCES OF LIQUID, THEN CONSUMED). , Disp: , Rfl:  .  rivaroxaban (XARELTO) 10 MG TABS tablet, Take 1 tablet (10 mg total) by mouth daily. For stroke prevention., Disp: 90 tablet, Rfl: 3 .  spironolactone (ALDACTONE) 25 MG tablet, Take 1 tablet (25 mg total) by mouth daily. Take at night, Disp: 30 tablet, Rfl: 3 .  umeclidinium bromide (INCRUSE ELLIPTA) 62.5 MCG/INH AEPB, Inhale 1 puff into the lungs daily., Disp:  , Rfl:  No Known Allergies   Social History   Socioeconomic History  . Marital status: Married    Spouse name: Not on file  . Number of children: 3  . Years of education: Not on file  . Highest education level: Not on file  Occupational History  . Not on file  Tobacco Use  . Smoking status: Former Smoker    Packs/day: 1.00    Years: 30.00    Pack years: 30.00    Types: Cigarettes    Quit date: 1989    Years since quitting: 32.1  . Smokeless tobacco: Never Used  Substance and Sexual Activity  . Alcohol use: No  . Drug use: No  . Sexual activity: Not Currently    Partners: Male    Birth control/protection: Post-menopausal, Surgical  Other Topics Concern  . Not on  file  Social History Narrative   Married. 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 Difficulty of Paying Living Expenses: Not on file  Food Insecurity:   . Worried About Charity fundraiser in the Last Year: Not on file  . Ran Out of Food in the Last Year: Not on file  Transportation Needs:   . Lack of Transportation (Medical): Not on file  . Lack of Transportation (Non-Medical): Not on file  Physical Activity:   . Days of Exercise per Week: Not on file  . Minutes of Exercise per Session: Not on file  Stress:   . Feeling of Stress : Not on file  Social Connections:   . Frequency of Communication with Friends and Family: Not  on file  . Frequency of Social Gatherings with Friends and Family: Not on file  . Attends Religious Services: Not on file  . Active Member of Clubs or Organizations: Not on file  . Attends Archivist Meetings: Not on file  . Marital Status: Not on file  Intimate Partner Violence:   . Fear of Current or Ex-Partner: Not on file  . Emotionally Abused: Not on file  . Physically Abused: Not on file  . Sexually Abused: Not on file    Physical Exam Vitals reviewed.  HENT:     Head: Normocephalic.     Nose: Nose normal.     Mouth/Throat:     Mouth: Mucous membranes are moist.  Eyes:     Pupils: Pupils are equal, round, and reactive to light.  Cardiovascular:     Rate and Rhythm: Normal rate and regular rhythm.     Pulses: Normal pulses.     Heart sounds: Normal heart sounds.  Pulmonary:     Effort: Pulmonary effort is normal.     Breath sounds: Normal breath sounds.  Abdominal:     Palpations: Abdomen is soft.     Comments: General abdominal pain.   Musculoskeletal:        General: Normal range of motion.     Cervical back: Normal range of motion.     Right lower leg: No edema.     Left  lower leg: No edema.  Skin:    General: Skin is warm and dry.     Capillary Refill: Capillary refill takes less than 2 seconds.  Neurological:     Mental Status: She is alert. Mental status is at baseline.  Psychiatric:        Mood and Affect: Mood normal.     Arrived for home visit, Denise [name] seated in her chair alert and oriented on her oxygen complaining of general abdominal pain. Patient denied nausea, vomiting, diarrhea. Patient stated she had been using an acid reducer recommended by her hospice nurse. Vitals obtained and as noted, patient noted a decrease in her weight. Patient and her husband stated her appetite has decreased slightly. Patient meds verified and confirmed. Pill box filled accordingly. Patient stated she had her first COVID vaccine last Saturday with no side effects.  Patient had no further complaints or request. Home visit complete.   Meds called in for refill- -KLOR-CON    CBG- [id number]   Future Appointments  Date Time Provider Gate  02/20/2019  9:00 AM [facility] DEVICE REMOTES [facility]  03/12/2019  3:15 PM Frann Rider, NP [facility] None  03/24/2019  9:00 AM [facility] DEVICE REMOTES [facility]  05-06-2019  9:00 AM [facility] DEVICE REMOTES [facility]  05/26/2019  9:00 AM [facility] DEVICE REMOTES [facility]  05/26/2019 10:00 AM Tanda Rockers, MD [facility] None  06/26/2019  9:00 AM [facility] DEVICE REMOTES [facility]  07/28/2019  9:00 AM [facility] DEVICE REMOTES [facility]  08/28/2019  9:00 AM [facility] DEVICE REMOTES [facility]  09/29/2019  9:00 AM [facility] DEVICE REMOTES [facility]  10/30/2019  9:00 AM [facility] DEVICE REMOTES [facility]  12/01/2019  9:00 AM [facility] DEVICE REMOTES [facility]     ACTION: Home visit completed Next visit planned for one week.

## 2019-02-13 DIAGNOSIS — I272 Pulmonary hypertension, unspecified: Secondary | ICD-10-CM | POA: Diagnosis not present

## 2019-02-13 DIAGNOSIS — F419 Anxiety disorder, unspecified: Secondary | ICD-10-CM | POA: Diagnosis not present

## 2019-02-13 DIAGNOSIS — E119 Type 2 diabetes mellitus without complications: Secondary | ICD-10-CM | POA: Diagnosis not present

## 2019-02-13 DIAGNOSIS — J9611 Chronic respiratory failure with hypoxia: Secondary | ICD-10-CM | POA: Diagnosis not present

## 2019-02-13 DIAGNOSIS — I69354 Hemiplegia and hemiparesis following cerebral infarction affecting left non-dominant side: Secondary | ICD-10-CM | POA: Diagnosis not present

## 2019-02-13 DIAGNOSIS — I110 Hypertensive heart disease with heart failure: Secondary | ICD-10-CM | POA: Diagnosis not present

## 2019-02-13 DIAGNOSIS — J9622 Acute and chronic respiratory failure with hypercapnia: Secondary | ICD-10-CM | POA: Diagnosis not present

## 2019-02-13 DIAGNOSIS — J439 Emphysema, unspecified: Secondary | ICD-10-CM | POA: Diagnosis not present

## 2019-02-13 DIAGNOSIS — I5043 Acute on chronic combined systolic (congestive) and diastolic (congestive) heart failure: Secondary | ICD-10-CM | POA: Diagnosis not present

## 2019-02-17 DIAGNOSIS — I110 Hypertensive heart disease with heart failure: Secondary | ICD-10-CM | POA: Diagnosis not present

## 2019-02-17 DIAGNOSIS — J439 Emphysema, unspecified: Secondary | ICD-10-CM | POA: Diagnosis not present

## 2019-02-17 DIAGNOSIS — J9622 Acute and chronic respiratory failure with hypercapnia: Secondary | ICD-10-CM | POA: Diagnosis not present

## 2019-02-17 DIAGNOSIS — E119 Type 2 diabetes mellitus without complications: Secondary | ICD-10-CM | POA: Diagnosis not present

## 2019-02-17 DIAGNOSIS — J9611 Chronic respiratory failure with hypoxia: Secondary | ICD-10-CM | POA: Diagnosis not present

## 2019-02-17 DIAGNOSIS — I272 Pulmonary hypertension, unspecified: Secondary | ICD-10-CM | POA: Diagnosis not present

## 2019-02-17 DIAGNOSIS — F419 Anxiety disorder, unspecified: Secondary | ICD-10-CM | POA: Diagnosis not present

## 2019-02-17 DIAGNOSIS — I69354 Hemiplegia and hemiparesis following cerebral infarction affecting left non-dominant side: Secondary | ICD-10-CM | POA: Diagnosis not present

## 2019-02-17 DIAGNOSIS — I5043 Acute on chronic combined systolic (congestive) and diastolic (congestive) heart failure: Secondary | ICD-10-CM | POA: Diagnosis not present

## 2019-02-18 ENCOUNTER — Telehealth: Payer: Self-pay | Admitting: Primary Care

## 2019-02-18 DIAGNOSIS — I5043 Acute on chronic combined systolic (congestive) and diastolic (congestive) heart failure: Secondary | ICD-10-CM | POA: Diagnosis not present

## 2019-02-18 DIAGNOSIS — E119 Type 2 diabetes mellitus without complications: Secondary | ICD-10-CM | POA: Diagnosis not present

## 2019-02-18 DIAGNOSIS — I272 Pulmonary hypertension, unspecified: Secondary | ICD-10-CM | POA: Diagnosis not present

## 2019-02-18 DIAGNOSIS — I69354 Hemiplegia and hemiparesis following cerebral infarction affecting left non-dominant side: Secondary | ICD-10-CM | POA: Diagnosis not present

## 2019-02-18 DIAGNOSIS — J9611 Chronic respiratory failure with hypoxia: Secondary | ICD-10-CM | POA: Diagnosis not present

## 2019-02-18 DIAGNOSIS — J9622 Acute and chronic respiratory failure with hypercapnia: Secondary | ICD-10-CM | POA: Diagnosis not present

## 2019-02-18 DIAGNOSIS — I110 Hypertensive heart disease with heart failure: Secondary | ICD-10-CM | POA: Diagnosis not present

## 2019-02-18 DIAGNOSIS — F419 Anxiety disorder, unspecified: Secondary | ICD-10-CM | POA: Diagnosis not present

## 2019-02-18 DIAGNOSIS — J439 Emphysema, unspecified: Secondary | ICD-10-CM | POA: Diagnosis not present

## 2019-02-18 NOTE — Telephone Encounter (Signed)
Chalmers Cater (spouse) called wanting to see if kate could help pt get hospital bed.  He stated Claiborne Billings @ [facility] tried to get one for pt but was denied and she gave spouse a phone number to call and he could not get anyone.  He stated pt has problems sleeping

## 2019-02-18 NOTE — Telephone Encounter (Signed)
Denise [name], can you intervene or send this to someone who can help? It sounds like Home health RN has been denied twice for the hospital bed. Do we need a specific order? [employer] should approve this request given her medical history.  Do you mind taking a look?

## 2019-02-18 NOTE — Telephone Encounter (Signed)
I really do not want else to tell patient's husband. I have already asked him to check with the insurance is there any way to get the bed or something similar. I am not what else can be done since [employer] with [facility] tried a couple times already with different medical supply company.

## 2019-02-18 NOTE — Telephone Encounter (Signed)
Will check into this

## 2019-02-19 ENCOUNTER — Other Ambulatory Visit (HOSPITAL_COMMUNITY): Payer: Self-pay

## 2019-02-19 NOTE — Progress Notes (Signed)
Paramedicine Encounter    Patient ID: Denise [name], female    DOB: 03-May-1945, 74 y.o.   MRN: [id number]   Patient Care Team: [name], NP as PCP - General (Internal Medicine) [name], MD as Consulting Physician (Cardiology)  Patient Active Problem List   Diagnosis Date Noted  . Acute on chronic respiratory failure with hypoxia ([place]) 12/30/2018  . Acute on chronic diastolic CHF (congestive heart failure) ([place]) 12/30/2018  . Acute ischemic right MCA stroke ([place]) 11/27/2018  . Vaginal candidiasis   . Acute blood loss anemia   . Supplemental oxygen dependent   . Uncontrolled type 2 diabetes mellitus with hyperglycemia ([place])   . ICH (intracerebral hemorrhage) ([place]) s/p tPA administration 11/08/2018  . Hypertensive urgency 11/08/2018  . Cerebellar stroke, acute ([place]) 11/08/2018  . History of ischemic stroke 11/04/2018  . COPD with acute exacerbation ([place]) 10/28/2018  . Acute on chronic systolic CHF (congestive heart failure) ([place]) 10/28/2018  . Elevated troponin 10/28/2018  . Lactic acidosis 10/28/2018  . GERD (gastroesophageal reflux disease) 10/28/2018  . Acute on chronic respiratory failure with hypercapnia ([place]) 10/28/2018  . Diabetes mellitus without complication-diet controled 10/28/2018  . On home oxygen therapy 07/12/2018  . CHF (congestive heart failure) ([place]) 05/16/2018  . Chronic respiratory failure with hypoxia and hypercapnia ([place]) 05/03/2018  . Borderline abnormal TFTs 05/02/2018  . Type 2 diabetes mellitus ([place]) 04/26/2018  . Unintended weight loss 04/26/2018  . Lower extremity edema 02/20/2018  . Chronic respiratory failure with hypoxia ([place]) 10/02/2017  . Hematuria 09/27/2017  . Pulmonary hypertension ([name]) c/w cor pulmonale   . Protein-calorie malnutrition, severe 09/17/2017  . Constipation   . Hyperparathyroidism, primary ([place]) 07/27/2016  . Loss of appetite 06/02/2016  . COPD GOLD  II  spirometry, severe Emphysema and 02 dep  11/22/2015  . Vitamin D deficiency 11/22/2015  . Anxiety and depression 11/22/2015  . Insomnia 11/22/2015  . Hyperlipidemia 11/22/2015  . Essential hypertension 11/22/2015  . Hx of colonic polyps 02/10/2013  . History of colon cancer 06/11/1986    Current Outpatient Medications:  .  albuterol (VENTOLIN HFA) 108 (90 Base) MCG/ACT inhaler, INHALE 1-2 PUFFS INTO THE LUNGS EVERY 4 HOURS AS NEEDED FOR WHEEZING OR SHORTNESS OF BREATH. (Patient taking differently: Inhale 1-2 puffs into the lungs every 4 (four) hours as needed for wheezing or shortness of breath. ), Disp: 6.7 g, Rfl: 5 .  atorvastatin (LIPITOR) 20 MG tablet, Take 1 tablet (20 mg total) by mouth every evening. For cholesterol (Patient taking differently: Take 20 mg by mouth at bedtime. For cholesterol), Disp: 90 tablet, Rfl: 3 .  Blood Glucose Calibration (ACCU-CHEK AVIVA) SOLN, USE AS INSTRUCTED TO TEST BLOOD SUGAR DAILY (Patient taking differently: 1 each by Other route daily. ), Disp: 1 each, Rfl: 2 .  Blood Glucose Monitoring Suppl (ACCU-CHEK AVIVA PLUS) w/Device KIT, Use as instructed to test blood sugar up to 3 times daily (Patient taking differently: 1 each by Other route 3 (three) times daily. ), Disp: 1 kit, Rfl: 0 .  famotidine (PEPCID) 40 MG tablet, Take 1 tablet twice, Disp: 180 tablet, Rfl: 0 .  fluticasone furoate-vilanterol (BREO ELLIPTA) 100-25 MCG/INH AEPB, Inhale 1 puff into the lungs daily., Disp: , Rfl:  .  furosemide (LASIX) 40 MG tablet, Take 40 mg by mouth daily. , Disp: , Rfl:  .  glipiZIDE (GLUCOTROL) 5 MG tablet, Take 1 tablet (5 mg total) by mouth daily before breakfast., Disp: 90 tablet, Rfl: 1 .  glucose  blood (ACCU-CHEK AVIVA PLUS) test strip, Use as instructed to test blood sugar up to 3 times daily, Disp: 300 each, Rfl: 1 .  KLOR-CON M10 10 MEQ tablet, TAKE 2 TABLETS (20 MEQ TOTAL) BY MOUTH DAILY. FOR LOW POTASSIUM., Disp: 180 tablet, Rfl: 1 .  Lancets (ACCU-CHEK SOFT TOUCH) lancets, Use as instructed to  test blood sugar up to 3 times daily (Patient taking differently: 1 each by Other route 3 (three) times daily. ), Disp: 300 each, Rfl: 1 .  metoprolol tartrate (LOPRESSOR) 25 MG tablet, TAKE 0.5 TABLETS (12.5 MG TOTAL) BY MOUTH 2 (TWO) TIMES DAILY. (Patient taking differently: Take 12.5 mg by mouth 2 (two) times daily with a meal. ), Disp: 90 tablet, Rfl: 0 .  mirtazapine (REMERON) 15 MG tablet, TAKE 1 TABLET BY MOUTH AT BEDTIME. FOR DEPRESSION AND APPETITE. (Patient taking differently: Take 15 mg by mouth at bedtime. ), Disp: 90 tablet, Rfl: 3 .  OXYGEN, Inhale 4 L into the lungs continuous. , Disp: , Rfl:  .  polyethylene glycol powder (GLYCOLAX/MIRALAX) 17 GM/SCOOP powder, Take 17 g by mouth daily as needed for mild constipation (TO BE MIXED INTO 4-8 OUNCES OF LIQUID, THEN CONSUMED). , Disp: , Rfl:  .  rivaroxaban (XARELTO) 10 MG TABS tablet, Take 1 tablet (10 mg total) by mouth daily. For stroke prevention., Disp: 90 tablet, Rfl: 3 .  spironolactone (ALDACTONE) 25 MG tablet, Take 1 tablet (25 mg total) by mouth daily. Take at night, Disp: 30 tablet, Rfl: 3 .  umeclidinium bromide (INCRUSE ELLIPTA) 62.5 MCG/INH AEPB, Inhale 1 puff into the lungs daily., Disp:  , Rfl:  .  atorvastatin (LIPITOR) 10 MG tablet, , Disp: , Rfl:  No Known Allergies   Social History   Socioeconomic History  . Marital status: Married    Spouse name: Not on file  . Number of children: 3  . Years of education: Not on file  . Highest education level: Not on file  Occupational History  . Not on file  Tobacco Use  . Smoking status: Former Smoker    Packs/day: 1.00    Years: 30.00    Pack years: 30.00    Types: Cigarettes    Quit date: 1989    Years since quitting: 32.1  . Smokeless tobacco: Never Used  Substance and Sexual Activity  . Alcohol use: No  . Drug use: No  . Sexual activity: Not Currently    Partners: Male    Birth control/protection: Post-menopausal, Surgical  Other Topics Concern  . Not on  file  Social History Narrative   Married. 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 Difficulty of Paying Living Expenses: Not on file  Food Insecurity:   . Worried About Charity fundraiser in the Last Year: Not on file  . Ran Out of Food in the Last Year: Not on file  Transportation Needs:   . Lack of Transportation (Medical): Not on file  . Lack of Transportation (Non-Medical): Not on file  Physical Activity:   . Days of Exercise per Week: Not on file  . Minutes of Exercise per Session: Not on file  Stress:   . Feeling of Stress : Not on file  Social Connections:   . Frequency of Communication with Friends and Family: Not  on file  . Frequency of Social Gatherings with Friends and Family: Not on file  . Attends Religious Services: Not on file  . Active Member of Clubs or Organizations: Not on file  . Attends Archivist Meetings: Not on file  . Marital Status: Not on file  Intimate Partner Violence:   . Fear of Current or Ex-Partner: Not on file  . Emotionally Abused: Not on file  . Physically Abused: Not on file  . Sexually Abused: Not on file    Physical Exam HENT:     Head: Normocephalic.     Nose: Nose normal.     Mouth/Throat:     Mouth: Mucous membranes are moist.  Eyes:     Pupils: Pupils are equal, round, and reactive to light.  Cardiovascular:     Rate and Rhythm: Normal rate and regular rhythm.     Pulses: Normal pulses.     Heart sounds: Normal heart sounds.  Pulmonary:     Effort: Pulmonary effort is normal.     Breath sounds: Normal breath sounds.  Abdominal:     General: Abdomen is flat.     Palpations: Abdomen is soft.  Musculoskeletal:        General: Normal range of motion.     Cervical back: Normal range of motion.     Right lower leg: No edema.     Left lower leg: No edema.  Skin:     General: Skin is warm and dry.     Capillary Refill: Capillary refill takes less than 2 seconds.  Neurological:     Mental Status: She is alert. Mental status is at baseline.  Psychiatric:        Mood and Affect: Mood normal.     Arrived for home visit for [facility]. She reports feeling fine, with no complaints today. Vitals obtained are as noted. Medications confirmed and reviewed. Pill box filled. Denise [name] agreed to home visit in one week. I made sure she had plenty of spare oxygen tanks in preparation for upcoming winter weather, in which she did. I checked to make sure they were full and accessible. Her husband reports they have plenty of water and food. Home visit complete.    CBG- 116 Weight- 89.6lbs    NO REFILLS     Future Appointments  Date Time Provider [place]  02/20/2019  9:00 AM [facility] DEVICE REMOTES [facility]  03/12/2019  3:15 PM Frann Rider, NP [facility] None  03/24/2019  9:00 AM [facility] DEVICE REMOTES [facility]  05-10-2019  9:00 AM [facility] DEVICE REMOTES [facility]  05/26/2019  9:00 AM [facility] DEVICE REMOTES [facility]  05/26/2019 10:00 AM Tanda Rockers, MD [facility] None  06/26/2019  9:00 AM [facility] DEVICE REMOTES [facility]  07/28/2019  9:00 AM [facility] DEVICE REMOTES [facility]  08/28/2019  9:00 AM [facility] DEVICE REMOTES [facility]  09/29/2019  9:00 AM [facility] DEVICE REMOTES [facility]  10/30/2019  9:00 AM [facility] DEVICE REMOTES [facility]  12/01/2019  9:00 AM [facility] DEVICE REMOTES [facility]     ACTION: Home visit completed Next visit planned for one week

## 2019-02-19 NOTE — Telephone Encounter (Signed)
LVM with [facility] at [employer], [place], at [phone]. They do not have any Claiborne Billings that works there.

## 2019-02-20 ENCOUNTER — Ambulatory Visit (INDEPENDENT_AMBULATORY_CARE_PROVIDER_SITE_OTHER): Payer: Medicare HMO | Admitting: *Deleted

## 2019-02-20 DIAGNOSIS — I63511 Cerebral infarction due to unspecified occlusion or stenosis of right middle cerebral artery: Secondary | ICD-10-CM | POA: Diagnosis not present

## 2019-02-20 LAB — CUP PACEART REMOTE DEVICE CHECK
Date Time Interrogation Session: 20210217230308
Implantable Pulse Generator Implant Date: 20201106

## 2019-02-20 NOTE — Progress Notes (Signed)
ILR Remote

## 2019-02-21 NOTE — Telephone Encounter (Signed)
Contacted Darlene at [facility] that reports they do have two Kelly's in the office. She does not have any documentation of Kelly starting a PA. She requested a verbal order for hospital bed. Verbal order was given. She needs a signed order also. Advised it would be faxed. She will also send an order to have signed if the order is not found here. Fax number is [phone]. Vallarie Mare, CMA, had copy of order. Faxed to number above, attn: Darlene.

## 2019-02-24 ENCOUNTER — Other Ambulatory Visit: Payer: Self-pay

## 2019-02-24 ENCOUNTER — Emergency Department (HOSPITAL_COMMUNITY)
Admission: EM | Admit: 2019-02-24 | Discharge: 2019-02-25 | Disposition: A | Discharge status: Home / Self Care | Payer: Medicare HMO | Attending: Emergency Medicine | Admitting: Emergency Medicine

## 2019-02-24 ENCOUNTER — Encounter (HOSPITAL_COMMUNITY): Payer: Self-pay

## 2019-02-24 DIAGNOSIS — F419 Anxiety disorder, unspecified: Secondary | ICD-10-CM | POA: Diagnosis not present

## 2019-02-24 DIAGNOSIS — R1084 Generalized abdominal pain: Secondary | ICD-10-CM

## 2019-02-24 DIAGNOSIS — J449 Chronic obstructive pulmonary disease, unspecified: Secondary | ICD-10-CM | POA: Insufficient documentation

## 2019-02-24 DIAGNOSIS — E119 Type 2 diabetes mellitus without complications: Secondary | ICD-10-CM | POA: Diagnosis not present

## 2019-02-24 DIAGNOSIS — J9622 Acute and chronic respiratory failure with hypercapnia: Secondary | ICD-10-CM | POA: Diagnosis not present

## 2019-02-24 DIAGNOSIS — I110 Hypertensive heart disease with heart failure: Secondary | ICD-10-CM | POA: Diagnosis not present

## 2019-02-24 DIAGNOSIS — Z79899 Other long term (current) drug therapy: Secondary | ICD-10-CM | POA: Diagnosis not present

## 2019-02-24 DIAGNOSIS — I10 Essential (primary) hypertension: Secondary | ICD-10-CM | POA: Diagnosis not present

## 2019-02-24 DIAGNOSIS — R109 Unspecified abdominal pain: Secondary | ICD-10-CM | POA: Diagnosis not present

## 2019-02-24 DIAGNOSIS — J9611 Chronic respiratory failure with hypoxia: Secondary | ICD-10-CM | POA: Diagnosis not present

## 2019-02-24 DIAGNOSIS — N3000 Acute cystitis without hematuria: Secondary | ICD-10-CM

## 2019-02-24 DIAGNOSIS — I69354 Hemiplegia and hemiparesis following cerebral infarction affecting left non-dominant side: Secondary | ICD-10-CM | POA: Diagnosis not present

## 2019-02-24 DIAGNOSIS — Z87891 Personal history of nicotine dependence: Secondary | ICD-10-CM | POA: Insufficient documentation

## 2019-02-24 DIAGNOSIS — I272 Pulmonary hypertension, unspecified: Secondary | ICD-10-CM | POA: Diagnosis not present

## 2019-02-24 DIAGNOSIS — R52 Pain, unspecified: Secondary | ICD-10-CM | POA: Diagnosis not present

## 2019-02-24 DIAGNOSIS — R402411 Glasgow coma scale score 13-15, in the field [EMT or ambulance]: Secondary | ICD-10-CM | POA: Diagnosis not present

## 2019-02-24 DIAGNOSIS — Z7984 Long term (current) use of oral hypoglycemic drugs: Secondary | ICD-10-CM | POA: Insufficient documentation

## 2019-02-24 DIAGNOSIS — I5043 Acute on chronic combined systolic (congestive) and diastolic (congestive) heart failure: Secondary | ICD-10-CM | POA: Diagnosis not present

## 2019-02-24 DIAGNOSIS — R601 Generalized edema: Secondary | ICD-10-CM | POA: Insufficient documentation

## 2019-02-24 DIAGNOSIS — J439 Emphysema, unspecified: Secondary | ICD-10-CM | POA: Diagnosis not present

## 2019-02-24 MED ORDER — ONDANSETRON HCL 4 MG/2ML IJ SOLN
4.0000 mg | Freq: Once | INTRAMUSCULAR | Status: AC
  Administered 2019-02-25: 4 mg via INTRAVENOUS
  Filled 2019-02-24: qty 2

## 2019-02-24 MED ORDER — FENTANYL CITRATE (PF) 100 MCG/2ML IJ SOLN
50.0000 ug | Freq: Once | INTRAMUSCULAR | Status: AC
  Administered 2019-02-25: 50 ug via INTRAVENOUS
  Filled 2019-02-24: qty 2

## 2019-02-24 NOTE — ED Triage Notes (Signed)
Patient brought in by EMS with 24 hour c/o mid upper gastric pain, constant, 8/10, poor appetite.

## 2019-02-24 NOTE — ED Provider Notes (Signed)
TIME SEEN: 11:21 PM  CHIEF COMPLAINT: Abdominal pain  HPI: Patient is a 74 year old female with history of COPD on chronic 4 L oxygen, colon cancer 1988 status post resection, hypertension, hyperlipidemia, small bowel obstruction who presents to the emergency department with generalized abdominal pain worse in the mid and upper abdomen that started yesterday.  She has had nausea without vomiting.  No diarrhea.  No dysuria, hematuria, vaginal bleeding or discharge.  Status post hysterectomy.  States she does not think she is ever had pain like this before.  No radiation of pain.  No aggravating or alleviating factors.  Of note, patient is hypertensive here.  Reports she has been compliant with her medications.  No headache, vision changes, chest pain.  Has chronic shortness of breath which is unchanged.  Patient was here in the ED on 01/17/2019 for abdominal pain and had a CT of abdomen pelvis that showed anasarca, colitis and cystitis.  Patient was discharged from the emergency department at that time.  ROS: See HPI, limited as patient is a very poor historian Constitutional: no fever  Eyes: no drainage  ENT: no runny nose   Cardiovascular:  no chest pain  Resp: no SOB  GI: no vomiting GU: no dysuria Integumentary: no rash  Allergy: no hives  Musculoskeletal: no leg swelling  Neurological: no slurred speech ROS otherwise negative  PAST MEDICAL HISTORY/PAST SURGICAL HISTORY:  Past Medical History:  Diagnosis Date  . Altered mental status   . Anxiety and depression   . Chronic respiratory failure with hypoxia ([place]) 10/02/2017   Assoc with ? Cor pulmonale dx 09/2017 so rx 02 1-2 lpm 24/7  - 10/17/2017  Walked RA x 3 laps @ 185 ft each stopped due to  End of study, nl pace,  desat to 87 on 3rd lap - 12/19/2017  Saturations on Room Air at Rest =91 % and while Ambulating = 87%  But  on  2 Liters of pulsed oxygen while Ambulating =93% so rec POC 2lpm walking / use at rest if sats under 90%       . Colon cancer ([place]) 1988   Resected  . COPD (chronic obstructive pulmonary disease) ([place])   . Diabetes mellitus without complication ([name])    diet controlled- no meds. per pt  . Diarrhea 04/02/2018  . Diverticulosis   . Hyperlipidemia   . Hypertension   . Hypoxia 10/30/2018  . Insomnia   . Primary hyperparathyroidism ([place])   . SBO (small bowel obstruction) ([place])   . Stroke ([place]) 10/2018   tpa administered  . Tubular adenoma of rectum 02/23/2013   low grade  . Vitamin D deficiency     MEDICATIONS:  Prior to Admission medications   Medication Sig Start Date End Date Taking? Authorizing Provider  albuterol (VENTOLIN HFA) 108 (90 Base) MCG/ACT inhaler INHALE 1-2 PUFFS INTO THE LUNGS EVERY 4 HOURS AS NEEDED FOR WHEEZING OR SHORTNESS OF BREATH. Patient taking differently: Inhale 1-2 puffs into the lungs every 4 (four) hours as needed for wheezing or shortness of breath.  04/30/18   Parrett, Fonnie Mu, NP  atorvastatin (LIPITOR) 10 MG tablet  01/18/19   [provider]  atorvastatin (LIPITOR) 20 MG tablet Take 1 tablet (20 mg total) by mouth every evening. For cholesterol Patient taking differently: Take 20 mg by mouth at bedtime. For cholesterol 12/12/18   [name], NP  Blood Glucose Calibration (ACCU-CHEK AVIVA) SOLN USE AS INSTRUCTED TO TEST BLOOD SUGAR DAILY Patient taking differently: 1  each by Other route daily.  05/28/18   [name], NP  Blood Glucose Monitoring Suppl (ACCU-CHEK AVIVA PLUS) w/Device KIT Use as instructed to test blood sugar up to 3 times daily Patient taking differently: 1 each by Other route 3 (three) times daily.  05/28/18   [name], NP  famotidine (PEPCID) 40 MG tablet Take 1 tablet twice 01/28/19   Tanda Rockers, MD  fluticasone furoate-vilanterol (BREO ELLIPTA) 100-25 MCG/INH AEPB Inhale 1 puff into the lungs daily.    [provider]  furosemide (LASIX) 40 MG tablet Take 40 mg by mouth daily.     [provider]  glipiZIDE (GLUCOTROL) 5 MG tablet Take 1 tablet (5 mg total) by mouth daily before breakfast. 01/07/19   [name], NP  glucose blood (ACCU-CHEK AVIVA PLUS) test strip Use as instructed to test blood sugar up to 3 times daily 05/28/18   [name], NP  KLOR-CON M10 10 MEQ tablet TAKE 2 TABLETS (20 MEQ TOTAL) BY MOUTH DAILY. FOR LOW POTASSIUM. 01/27/19   Larey Dresser, MD  Lancets (ACCU-CHEK SOFT [facility]) lancets Use as instructed to test blood sugar up to 3 times daily Patient taking differently: 1 each by Other route 3 (three) times daily.  05/28/18   [name], NP  metoprolol tartrate (LOPRESSOR) 25 MG tablet TAKE 0.5 TABLETS (12.5 MG TOTAL) BY MOUTH 2 (TWO) TIMES DAILY. Patient taking differently: Take 12.5 mg by mouth 2 (two) times daily with a meal.  12/12/18   [name], NP  mirtazapine (REMERON) 15 MG tablet TAKE 1 TABLET BY MOUTH AT BEDTIME. FOR DEPRESSION AND APPETITE. Patient taking differently: Take 15 mg by mouth at bedtime.  09/25/18   [name], NP  OXYGEN Inhale 4 L into the lungs continuous.     [provider]  polyethylene glycol powder (GLYCOLAX/MIRALAX) 17 GM/SCOOP powder Take 17 g by mouth daily as needed for mild constipation (TO BE MIXED INTO 4-8 OUNCES OF LIQUID, THEN CONSUMED).  08/13/15   [provider]  rivaroxaban (XARELTO) 10 MG TABS tablet Take 1 tablet (10 mg total) by mouth daily. For stroke prevention. 01/27/19   [name], NP  spironolactone (ALDACTONE) 25 MG tablet Take 1 tablet (25 mg total) by mouth daily. Take at night 01/30/19   Larey Dresser, MD  umeclidinium bromide (INCRUSE ELLIPTA) 62.5 MCG/INH AEPB Inhale 1 puff into the lungs daily. 11/20/18   Love, Ivan Anchors, PA-C    ALLERGIES:  No Known Allergies  SOCIAL HISTORY:  Social History   Tobacco Use  . Smoking status: Former Smoker    Packs/day: 1.00    Years: 30.00    Pack years: 30.00    Types: Cigarettes    Quit  date: 1989    Years since quitting: 32.1  . Smokeless tobacco: Never Used  Substance Use Topics  . Alcohol use: No    FAMILY HISTORY: Family History  Problem Relation Age of Onset  . Ovarian cancer Mother   . Breast cancer Neg Hx   . Hyperparathyroidism Neg Hx   . Colon cancer Neg Hx   . Esophageal cancer Neg Hx   . Stomach cancer Neg Hx     EXAM: BP (!) 179/123 (BP Location: Left Arm)   Pulse 78   Temp (!) 97.4 F (36.3 C) (Oral)   Resp (!) 21   Ht 5' 5" (1.651 m)   Wt 39.9 kg   SpO2  99%   BMI 14.64 kg/m  CONSTITUTIONAL: Alert and oriented to person, place and time and responds appropriately to questions.  Elderly, chronically ill-appearing, very thin HEAD: Normocephalic EYES: Conjunctivae clear, pupils appear equal, EOM appear intact ENT: normal nose; moist mucous membranes NECK: Supple, normal ROM CARD: RRR; S1 and S2 appreciated; no murmurs, no clicks, no rubs, no gallops RESP: Normal chest excursion without splinting or tachypnea; breath sounds clear and equal bilaterally; no wheezes, no rhonchi, no rales, no hypoxia or respiratory distress, speaking full sentences ABD/GI: Normal bowel sounds; non-distended; soft, diffusely tender throughout the abdomen, no rebound, no guarding, no peritoneal signs, no hepatosplenomegaly BACK:  The back appears normal EXT: Normal ROM in all joints; no deformity noted, no edema; no cyanosis SKIN: Normal color for age and race; warm; no rash on exposed skin NEURO: Moves all extremities equally PSYCH: The patient's mood and manner are appropriate.   MEDICAL DECISION MAKING: Patient here with abdominal pain, nausea since yesterday.  Noted to be extremely hypertensive here.  This appears abnormal from her baseline.  Could be secondary to discomfort.  Will check manual blood pressures on both arms.  Last CT scan did not show any sign of aortic aneurysm.  Will obtain labs, urine and CT of the abdomen pelvis.  Will give pain and nausea  medicine in the ED.  ED PROGRESS: Patient's urine shows nitrates and many bacteria.  Her CT scan shows bladder wall thickening that could be due to cystitis.  She is extremely poor historian and given her abdominal pain, will cover with fosfomycin and send urine culture.  She also has anasarca that is similar to CT a month ago.  No other signs of volume overload.  She has emphysema that is chronic.  No other acute changes.  No vomiting here.  I feel she is safe to be discharged home.  Recommended close PCP follow-up.   At this time, I do not feel there is any life-threatening condition present. I have reviewed, interpreted and discussed all results (EKG, imaging, lab, urine as appropriate) and exam findings with patient/family. I have reviewed nursing notes and appropriate previous records.  I feel the patient is safe to be discharged home without further emergent workup and can continue workup as an outpatient as needed. Discussed usual and customary return precautions. Patient/family verbalize understanding and are comfortable with this plan.  Outpatient follow-up has been provided as needed. All questions have been answered.    Denise [name] was evaluated in Emergency Department on 02/24/2019 for the symptoms described in the history of present illness. She was evaluated in the context of the global COVID-19 pandemic, which necessitated consideration that the patient might be at risk for infection with the SARS-CoV-2 virus that causes COVID-19. Institutional protocols and algorithms that pertain to the evaluation of patients at risk for COVID-19 are in a state of rapid change based on information released by regulatory bodies including the CDC and federal and state organizations. These policies and algorithms were followed during the patient's care in the ED.  Patient was seen wearing N95, face shield, gloves.    , Delice Bison, DO 02/25/19 [phone]

## 2019-02-25 ENCOUNTER — Telehealth: Payer: Self-pay

## 2019-02-25 ENCOUNTER — Emergency Department (HOSPITAL_COMMUNITY): Payer: Medicare HMO

## 2019-02-25 DIAGNOSIS — R1084 Generalized abdominal pain: Secondary | ICD-10-CM | POA: Diagnosis not present

## 2019-02-25 DIAGNOSIS — R109 Unspecified abdominal pain: Secondary | ICD-10-CM | POA: Diagnosis not present

## 2019-02-25 DIAGNOSIS — N3000 Acute cystitis without hematuria: Secondary | ICD-10-CM | POA: Diagnosis not present

## 2019-02-25 LAB — CBC WITH DIFFERENTIAL/PLATELET
Abs Immature Granulocytes: 0.01 10*3/uL (ref 0.00–0.07)
Basophils Absolute: 0 10*3/uL (ref 0.0–0.1)
Basophils Relative: 1 %
Eosinophils Absolute: 0.1 10*3/uL (ref 0.0–0.5)
Eosinophils Relative: 1 %
HCT: 41.2 % (ref 36.0–46.0)
Hemoglobin: 12.9 g/dL (ref 12.0–15.0)
Immature Granulocytes: 0 %
Lymphocytes Relative: 16 %
Lymphs Abs: 0.8 10*3/uL (ref 0.7–4.0)
MCH: 27.3 pg (ref 26.0–34.0)
MCHC: 31.3 g/dL (ref 30.0–36.0)
MCV: 87.3 fL (ref 80.0–100.0)
Monocytes Absolute: 0.4 10*3/uL (ref 0.1–1.0)
Monocytes Relative: 8 %
Neutro Abs: 3.7 10*3/uL (ref 1.7–7.7)
Neutrophils Relative %: 74 %
Platelets: 186 10*3/uL (ref 150–400)
RBC: 4.72 MIL/uL (ref 3.87–5.11)
RDW: 14.7 % (ref 11.5–15.5)
WBC: 5 10*3/uL (ref 4.0–10.5)
nRBC: 0 % (ref 0.0–0.2)

## 2019-02-25 LAB — COMPREHENSIVE METABOLIC PANEL
ALT: 16 U/L (ref 0–44)
AST: 23 U/L (ref 15–41)
Albumin: 4.2 g/dL (ref 3.5–5.0)
Alkaline Phosphatase: 82 U/L (ref 38–126)
Anion gap: 10 (ref 5–15)
BUN: 18 mg/dL (ref 8–23)
CO2: 27 mmol/L (ref 22–32)
Calcium: 11.3 mg/dL — ABNORMAL HIGH (ref 8.9–10.3)
Chloride: 99 mmol/L (ref 98–111)
Creatinine, Ser: 1.29 mg/dL — ABNORMAL HIGH (ref 0.44–1.00)
GFR calc Af Amer: 48 mL/min — ABNORMAL LOW (ref >60)
GFR calc non Af Amer: 41 mL/min — ABNORMAL LOW (ref >60)
Glucose, Bld: 118 mg/dL — ABNORMAL HIGH (ref 70–99)
Potassium: 5 mmol/L (ref 3.5–5.1)
Sodium: 136 mmol/L (ref 135–145)
Total Bilirubin: 0.9 mg/dL (ref 0.3–1.2)
Total Protein: 6.8 g/dL (ref 6.5–8.1)

## 2019-02-25 LAB — URINALYSIS, ROUTINE W REFLEX MICROSCOPIC
Bilirubin Urine: NEGATIVE
Glucose, UA: NEGATIVE mg/dL
Hgb urine dipstick: NEGATIVE
Ketones, ur: NEGATIVE mg/dL
Leukocytes,Ua: NEGATIVE
Nitrite: POSITIVE — AB
Protein, ur: NEGATIVE mg/dL
Specific Gravity, Urine: 1.005 (ref 1.005–1.030)
pH: 7 (ref 5.0–8.0)

## 2019-02-25 LAB — LIPASE, BLOOD: Lipase: 27 U/L (ref 11–51)

## 2019-02-25 MED ORDER — FOSFOMYCIN TROMETHAMINE 3 G PO PACK
3.0000 g | PACK | Freq: Once | ORAL | Status: AC
  Administered 2019-02-25: 3 g via ORAL
  Filled 2019-02-25: qty 3

## 2019-02-25 MED ORDER — SODIUM CHLORIDE (PF) 0.9 % IJ SOLN
INTRAMUSCULAR | Status: AC
  Filled 2019-02-25: qty 50

## 2019-02-25 MED ORDER — IOHEXOL 300 MG/ML  SOLN
75.0000 mL | Freq: Once | INTRAMUSCULAR | Status: AC | PRN
  Administered 2019-02-25: 75 mL via INTRAVENOUS

## 2019-02-25 NOTE — ED Notes (Signed)
Called patient's cell phone number and got an answer from husband. He is coming to get her now

## 2019-02-25 NOTE — Telephone Encounter (Addendum)
ED visit reviewed, treated for cystitis.  Please contact patient tomorrow 02/26/19 to check in on her symptoms. We should have the urine culture back by the afternoon.

## 2019-02-25 NOTE — ED Notes (Signed)
Unsuccessful blood draw x2 

## 2019-02-25 NOTE — ED Notes (Signed)
Upon goign into to DC pt. Pt had soiled the bed linen. Pt cleaned and pt assisted into clothing. Pt assisted into WC. DC reviewed with pt and husband. Pt assisted to car.

## 2019-02-25 NOTE — ED Notes (Signed)
Main phlebotomy called to come assist with lab draw.

## 2019-02-25 NOTE — ED Notes (Signed)
Attempted to call patient's husband multiple times with no answer. Verified the number earlier when speaking to husband and he confirmed the number was correct. Left 2 voicemails.

## 2019-02-25 NOTE — Telephone Encounter (Signed)
[place] Night - Client Nonclinical Telephone Record AccessNurse Client [facility] Night - Client Client Site [place] Physician Alma Friendly - NP Contact Type Call Who Is Calling Patient / Member / Family / Caregiver Caller Name Naira Standiford Caller Phone Number [phone] Patient Name Denise [name] Patient DOB 1945/07/03 Call Type Message Only Information Provided Reason for Call Request for General Office Information Initial Comment Caller states that his wife was taken to the hospital due to pains in her stomach. He states that she has been taking to [facility]. Additional Comment Disp. Time Disposition Final User 02/25/2019 12:31:49 AM General Information Provided Yes Electa Sniff Call Closed By: Electa Sniff Transaction Date/Time: 02/25/2019 12:28:22 AM (ET)

## 2019-02-25 NOTE — Telephone Encounter (Signed)
Per chart review pt was seen  [facility] ED.

## 2019-02-25 NOTE — Discharge Instructions (Addendum)
You may take over-the-counter Tylenol 1000 mg every 6 hours as needed for pain.  I recommend close follow-up with your primary care doctor.  We have given you need a one-time dose of antibiotics here for urinary tract infection.  You do not need to go home on any further medication.

## 2019-02-25 NOTE — ED Notes (Signed)
Lab called to add urine culture.

## 2019-02-26 ENCOUNTER — Other Ambulatory Visit (HOSPITAL_COMMUNITY): Payer: Self-pay

## 2019-02-26 NOTE — Telephone Encounter (Signed)
LVM with Darlene at [place] at [facility]

## 2019-02-26 NOTE — Telephone Encounter (Signed)
Message left for patient and husband to return my call.

## 2019-02-26 NOTE — Progress Notes (Signed)
Paramedicine Encounter    Patient ID: Denise [name], female    DOB: 06-24-1945, 74 y.o.   MRN: [id number]   Patient Care Team: [name], NP as PCP - General (Internal Medicine) [name], MD as Consulting Physician (Cardiology)  Patient Active Problem List   Diagnosis Date Noted  . Acute on chronic respiratory failure with hypoxia ([place]) 12/30/2018  . Acute on chronic diastolic CHF (congestive heart failure) ([place]) 12/30/2018  . Acute ischemic right MCA stroke ([place]) 11/27/2018  . Vaginal candidiasis   . Acute blood loss anemia   . Supplemental oxygen dependent   . Uncontrolled type 2 diabetes mellitus with hyperglycemia ([place])   . ICH (intracerebral hemorrhage) ([place]) s/p tPA administration 11/08/2018  . Hypertensive urgency 11/08/2018  . Cerebellar stroke, acute ([place]) 11/08/2018  . History of ischemic stroke 11/04/2018  . COPD with acute exacerbation ([place]) 10/28/2018  . Acute on chronic systolic CHF (congestive heart failure) ([place]) 10/28/2018  . Elevated troponin 10/28/2018  . Lactic acidosis 10/28/2018  . GERD (gastroesophageal reflux disease) 10/28/2018  . Acute on chronic respiratory failure with hypercapnia ([place]) 10/28/2018  . Diabetes mellitus without complication-diet controled 10/28/2018  . On home oxygen therapy 07/12/2018  . CHF (congestive heart failure) ([place]) 05/16/2018  . Chronic respiratory failure with hypoxia and hypercapnia ([place]) 05/03/2018  . Borderline abnormal TFTs 05/02/2018  . Type 2 diabetes mellitus ([place]) 04/26/2018  . Unintended weight loss 04/26/2018  . Lower extremity edema 02/20/2018  . Chronic respiratory failure with hypoxia ([place]) 10/02/2017  . Hematuria 09/27/2017  . Pulmonary hypertension ([name]) c/w cor pulmonale   . Protein-calorie malnutrition, severe 09/17/2017  . Constipation   . Hyperparathyroidism, primary ([place]) 07/27/2016  . Loss of appetite 06/02/2016  . COPD GOLD  II  spirometry, severe Emphysema and 02 dep  11/22/2015  . Vitamin D deficiency 11/22/2015  . Anxiety and depression 11/22/2015  . Insomnia 11/22/2015  . Hyperlipidemia 11/22/2015  . Essential hypertension 11/22/2015  . Hx of colonic polyps 02/10/2013  . History of colon cancer 06/11/1986    Current Outpatient Medications:  .  atorvastatin (LIPITOR) 20 MG tablet, Take 1 tablet (20 mg total) by mouth every evening. For cholesterol (Patient taking differently: Take 20 mg by mouth at bedtime. For cholesterol), Disp: 90 tablet, Rfl: 3 .  Blood Glucose Calibration (ACCU-CHEK AVIVA) SOLN, USE AS INSTRUCTED TO TEST BLOOD SUGAR DAILY (Patient taking differently: 1 each by Other route daily. ), Disp: 1 each, Rfl: 2 .  Blood Glucose Monitoring Suppl (ACCU-CHEK AVIVA PLUS) w/Device KIT, Use as instructed to test blood sugar up to 3 times daily (Patient taking differently: 1 each by Other route 3 (three) times daily. ), Disp: 1 kit, Rfl: 0 .  famotidine (PEPCID) 40 MG tablet, Take 1 tablet twice, Disp: 180 tablet, Rfl: 0 .  fluticasone furoate-vilanterol (BREO ELLIPTA) 100-25 MCG/INH AEPB, Inhale 1 puff into the lungs daily., Disp: , Rfl:  .  furosemide (LASIX) 40 MG tablet, Take 40 mg by mouth daily. , Disp: , Rfl:  .  glipiZIDE (GLUCOTROL) 5 MG tablet, Take 1 tablet (5 mg total) by mouth daily before breakfast., Disp: 90 tablet, Rfl: 1 .  glucose blood (ACCU-CHEK AVIVA PLUS) test strip, Use as instructed to test blood sugar up to 3 times daily, Disp: 300 each, Rfl: 1 .  KLOR-CON M10 10 MEQ tablet, TAKE 2 TABLETS (20 MEQ TOTAL) BY MOUTH DAILY. FOR LOW POTASSIUM., Disp: 180 tablet, Rfl: 1 .  Lancets (ACCU-CHEK SOFT TOUCH) lancets,  Use as instructed to test blood sugar up to 3 times daily (Patient taking differently: 1 each by Other route 3 (three) times daily. ), Disp: 300 each, Rfl: 1 .  metoprolol tartrate (LOPRESSOR) 25 MG tablet, TAKE 0.5 TABLETS (12.5 MG TOTAL) BY MOUTH 2 (TWO) TIMES DAILY. (Patient taking differently: Take 12.5 mg by mouth 2  (two) times daily with a meal. ), Disp: 90 tablet, Rfl: 0 .  mirtazapine (REMERON) 15 MG tablet, TAKE 1 TABLET BY MOUTH AT BEDTIME. FOR DEPRESSION AND APPETITE. (Patient taking differently: Take 15 mg by mouth at bedtime. ), Disp: 90 tablet, Rfl: 3 .  OXYGEN, Inhale 4 L into the lungs continuous. , Disp: , Rfl:  .  polyethylene glycol powder (GLYCOLAX/MIRALAX) 17 GM/SCOOP powder, Take 17 g by mouth daily as needed for mild constipation (TO BE MIXED INTO 4-8 OUNCES OF LIQUID, THEN CONSUMED). , Disp: , Rfl:  .  rivaroxaban (XARELTO) 10 MG TABS tablet, Take 1 tablet (10 mg total) by mouth daily. For stroke prevention., Disp: 90 tablet, Rfl: 3 .  spironolactone (ALDACTONE) 25 MG tablet, Take 1 tablet (25 mg total) by mouth daily. Take at night, Disp: 30 tablet, Rfl: 3 .  umeclidinium bromide (INCRUSE ELLIPTA) 62.5 MCG/INH AEPB, Inhale 1 puff into the lungs daily., Disp:  , Rfl:  .  albuterol (VENTOLIN HFA) 108 (90 Base) MCG/ACT inhaler, INHALE 1-2 PUFFS INTO THE LUNGS EVERY 4 HOURS AS NEEDED FOR WHEEZING OR SHORTNESS OF BREATH. (Patient taking differently: Inhale 1-2 puffs into the lungs every 4 (four) hours as needed for wheezing or shortness of breath. ), Disp: 6.7 g, Rfl: 5 .  atorvastatin (LIPITOR) 10 MG tablet, , Disp: , Rfl:  No Known Allergies   Social History   Socioeconomic History  . Marital status: Married    Spouse name: Not on file  . Number of children: 3  . Years of education: Not on file  . Highest education level: Not on file  Occupational History  . Not on file  Tobacco Use  . Smoking status: Former Smoker    Packs/day: 1.00    Years: 30.00    Pack years: 30.00    Types: Cigarettes    Quit date: 1989    Years since quitting: 32.1  . Smokeless tobacco: Never Used  Substance and Sexual Activity  . Alcohol use: No  . Drug use: No  . Sexual activity: Not Currently    Partners: Male    Birth control/protection: Post-menopausal, Surgical  Other Topics Concern  . Not on  file  Social History Narrative   Married. 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 Difficulty of Paying Living Expenses: Not on file  Food Insecurity:   . Worried About Charity fundraiser in the Last Year: Not on file  . Ran Out of Food in the Last Year: Not on file  Transportation Needs:   . Lack of Transportation (Medical): Not on file  . Lack of Transportation (Non-Medical): Not on file  Physical Activity:   . Days of Exercise per Week: Not on file  . Minutes of Exercise per Session: Not on file  Stress:   . Feeling of Stress : Not on file  Social Connections:   . Frequency of Communication with Friends and Family: Not  on file  . Frequency of Social Gatherings with Friends and Family: Not on file  . Attends Religious Services: Not on file  . Active Member of Clubs or Organizations: Not on file  . Attends Archivist Meetings: Not on file  . Marital Status: Not on file  Intimate Partner Violence:   . Fear of Current or Ex-Partner: Not on file  . Emotionally Abused: Not on file  . Physically Abused: Not on file  . Sexually Abused: Not on file    Physical Exam Vitals reviewed.  HENT:     Head: Normocephalic.     Nose: Nose normal.     Mouth/Throat:     Mouth: Mucous membranes are moist.  Eyes:     Pupils: Pupils are equal, round, and reactive to light.  Cardiovascular:     Rate and Rhythm: Normal rate and regular rhythm.     Pulses: Normal pulses.     Heart sounds: Normal heart sounds.  Pulmonary:     Effort: Pulmonary effort is normal.     Breath sounds: Normal breath sounds.  Abdominal:     General: Abdomen is flat.     Palpations: Abdomen is soft.     Comments: Abdominal pain    Musculoskeletal:        General: Normal range of motion.     Cervical back: Normal range of motion.     Right lower leg:  No edema.     Left lower leg: No edema.  Skin:    General: Skin is warm and dry.     Capillary Refill: Capillary refill takes less than 2 seconds.  Neurological:     Mental Status: She is alert. Mental status is at baseline.  Psychiatric:        Mood and Affect: Mood normal.     Arrived for home visit for [facility] who was seated on the edge of her bed alert and oriented with no shortness of breath. Denise [name] complained of abdominal pain which she was just seen in ED for day before yesterday and discharged and told to take 666m of Tylenol twice daily. She said this is helping. Denied nausea, vomiting and constipation. Vitals obtained and are as noted. Medications reviewed and confirmed. Pill box filled accordingly. STiawannahad no other complaints or concerns. She agreed to visit in one week. I explained if pain persisted to call PCP for a follow up, patient and husband agreed with same. Home visit complete.   Refills: -Spironolactone     CBG- 93 Weight- 88.4lbs     Future Appointments  Date Time Provider [place] 03/05/2019 11:40 AM CPleas Koch NP [facility]  03/12/2019  3:15 PM MFrann Rider NP [facility] None  03/24/2019  9:00 AM [facility] DEVICE REMOTES [facility]  [id number] 9:00 AM [facility] DEVICE REMOTES [facility]  05/26/2019  9:00 AM [facility] DEVICE REMOTES [facility]  05/26/2019 10:00 AM WTanda Rockers MD [facility] None  06/26/2019  9:00 AM [facility] DEVICE REMOTES [facility]  07/28/2019  9:00 AM [facility] DEVICE REMOTES [facility]  08/28/2019  9:00 AM [facility] DEVICE REMOTES [facility]  09/29/2019  9:00 AM [facility] DEVICE REMOTES [facility]  10/30/2019  9:00 AM [facility] DEVICE REMOTES [facility]  12/01/2019  9:00 AM [facility] DEVICE REMOTES [facility]     ACTION: Home visit completed Next visit planned for  one week

## 2019-02-26 NOTE — Telephone Encounter (Signed)
Darlene left VM reporting she faxed the order on 2/19 to [facility]. She assumes they will contact the pt concerning the hospital bed.

## 2019-02-27 DIAGNOSIS — I110 Hypertensive heart disease with heart failure: Secondary | ICD-10-CM | POA: Diagnosis not present

## 2019-02-27 DIAGNOSIS — F419 Anxiety disorder, unspecified: Secondary | ICD-10-CM | POA: Diagnosis not present

## 2019-02-27 DIAGNOSIS — I5043 Acute on chronic combined systolic (congestive) and diastolic (congestive) heart failure: Secondary | ICD-10-CM | POA: Diagnosis not present

## 2019-02-27 DIAGNOSIS — J9622 Acute and chronic respiratory failure with hypercapnia: Secondary | ICD-10-CM | POA: Diagnosis not present

## 2019-02-27 DIAGNOSIS — I272 Pulmonary hypertension, unspecified: Secondary | ICD-10-CM | POA: Diagnosis not present

## 2019-02-27 DIAGNOSIS — J9611 Chronic respiratory failure with hypoxia: Secondary | ICD-10-CM | POA: Diagnosis not present

## 2019-02-27 DIAGNOSIS — E119 Type 2 diabetes mellitus without complications: Secondary | ICD-10-CM | POA: Diagnosis not present

## 2019-02-27 DIAGNOSIS — I69354 Hemiplegia and hemiparesis following cerebral infarction affecting left non-dominant side: Secondary | ICD-10-CM | POA: Diagnosis not present

## 2019-02-27 DIAGNOSIS — J439 Emphysema, unspecified: Secondary | ICD-10-CM | POA: Diagnosis not present

## 2019-02-27 LAB — URINE CULTURE: Culture: >=100000 — AB

## 2019-03-01 ENCOUNTER — Telehealth: Payer: Self-pay | Admitting: Emergency Medicine

## 2019-03-01 DIAGNOSIS — J9611 Chronic respiratory failure with hypoxia: Secondary | ICD-10-CM | POA: Diagnosis not present

## 2019-03-01 NOTE — Telephone Encounter (Signed)
Urine culture report faxed to [facility] @ [facility] Fax# [phone]

## 2019-03-02 ENCOUNTER — Other Ambulatory Visit (HOSPITAL_COMMUNITY): Payer: Self-pay | Admitting: Cardiology

## 2019-03-02 DIAGNOSIS — I509 Heart failure, unspecified: Secondary | ICD-10-CM | POA: Diagnosis not present

## 2019-03-02 DIAGNOSIS — I10 Essential (primary) hypertension: Secondary | ICD-10-CM

## 2019-03-02 DIAGNOSIS — J439 Emphysema, unspecified: Secondary | ICD-10-CM | POA: Diagnosis not present

## 2019-03-02 DIAGNOSIS — J449 Chronic obstructive pulmonary disease, unspecified: Secondary | ICD-10-CM | POA: Diagnosis not present

## 2019-03-02 DIAGNOSIS — J9611 Chronic respiratory failure with hypoxia: Secondary | ICD-10-CM | POA: Diagnosis not present

## 2019-03-02 DIAGNOSIS — I272 Pulmonary hypertension, unspecified: Secondary | ICD-10-CM | POA: Diagnosis not present

## 2019-03-02 DIAGNOSIS — J9612 Chronic respiratory failure with hypercapnia: Secondary | ICD-10-CM | POA: Diagnosis not present

## 2019-03-02 DIAGNOSIS — E119 Type 2 diabetes mellitus without complications: Secondary | ICD-10-CM | POA: Diagnosis not present

## 2019-03-02 DIAGNOSIS — R600 Localized edema: Secondary | ICD-10-CM | POA: Diagnosis not present

## 2019-03-02 DIAGNOSIS — E43 Unspecified severe protein-calorie malnutrition: Secondary | ICD-10-CM | POA: Diagnosis not present

## 2019-03-03 DIAGNOSIS — I272 Pulmonary hypertension, unspecified: Secondary | ICD-10-CM | POA: Diagnosis not present

## 2019-03-03 DIAGNOSIS — I110 Hypertensive heart disease with heart failure: Secondary | ICD-10-CM | POA: Diagnosis not present

## 2019-03-03 DIAGNOSIS — J9622 Acute and chronic respiratory failure with hypercapnia: Secondary | ICD-10-CM | POA: Diagnosis not present

## 2019-03-03 DIAGNOSIS — F419 Anxiety disorder, unspecified: Secondary | ICD-10-CM | POA: Diagnosis not present

## 2019-03-03 DIAGNOSIS — J9611 Chronic respiratory failure with hypoxia: Secondary | ICD-10-CM | POA: Diagnosis not present

## 2019-03-03 DIAGNOSIS — J439 Emphysema, unspecified: Secondary | ICD-10-CM | POA: Diagnosis not present

## 2019-03-03 DIAGNOSIS — I5043 Acute on chronic combined systolic (congestive) and diastolic (congestive) heart failure: Secondary | ICD-10-CM | POA: Diagnosis not present

## 2019-03-03 DIAGNOSIS — I69354 Hemiplegia and hemiparesis following cerebral infarction affecting left non-dominant side: Secondary | ICD-10-CM | POA: Diagnosis not present

## 2019-03-03 DIAGNOSIS — E119 Type 2 diabetes mellitus without complications: Secondary | ICD-10-CM | POA: Diagnosis not present

## 2019-03-04 ENCOUNTER — Telehealth: Payer: Self-pay

## 2019-03-04 DIAGNOSIS — E119 Type 2 diabetes mellitus without complications: Secondary | ICD-10-CM | POA: Diagnosis not present

## 2019-03-04 DIAGNOSIS — I272 Pulmonary hypertension, unspecified: Secondary | ICD-10-CM | POA: Diagnosis not present

## 2019-03-04 DIAGNOSIS — J9611 Chronic respiratory failure with hypoxia: Secondary | ICD-10-CM | POA: Diagnosis not present

## 2019-03-04 DIAGNOSIS — I5043 Acute on chronic combined systolic (congestive) and diastolic (congestive) heart failure: Secondary | ICD-10-CM | POA: Diagnosis not present

## 2019-03-04 DIAGNOSIS — I110 Hypertensive heart disease with heart failure: Secondary | ICD-10-CM | POA: Diagnosis not present

## 2019-03-04 DIAGNOSIS — F419 Anxiety disorder, unspecified: Secondary | ICD-10-CM | POA: Diagnosis not present

## 2019-03-04 DIAGNOSIS — I69354 Hemiplegia and hemiparesis following cerebral infarction affecting left non-dominant side: Secondary | ICD-10-CM | POA: Diagnosis not present

## 2019-03-04 DIAGNOSIS — J9622 Acute and chronic respiratory failure with hypercapnia: Secondary | ICD-10-CM | POA: Diagnosis not present

## 2019-03-04 DIAGNOSIS — J439 Emphysema, unspecified: Secondary | ICD-10-CM | POA: Diagnosis not present

## 2019-03-04 NOTE — Telephone Encounter (Signed)
Gennaro Africa, PT with [facility] called for verbal orders to continue PT 1 x week for 9 weeks. Can leave orders on VM

## 2019-03-04 NOTE — Telephone Encounter (Signed)
I was out of the office. Patient is schedule to be seen on 03/05/2019

## 2019-03-04 NOTE — Telephone Encounter (Signed)
Approved.  

## 2019-03-05 ENCOUNTER — Other Ambulatory Visit: Payer: Self-pay

## 2019-03-05 ENCOUNTER — Ambulatory Visit (INDEPENDENT_AMBULATORY_CARE_PROVIDER_SITE_OTHER): Payer: Medicare HMO | Admitting: Primary Care

## 2019-03-05 ENCOUNTER — Telehealth: Payer: Self-pay | Admitting: Primary Care

## 2019-03-05 ENCOUNTER — Other Ambulatory Visit (HOSPITAL_COMMUNITY): Payer: Self-pay

## 2019-03-05 ENCOUNTER — Ambulatory Visit: Payer: Medicare HMO | Admitting: Cardiovascular Disease

## 2019-03-05 VITALS — BP 100/68 | HR 88 | Temp 96.6°F | Ht 65.0 in | Wt 91.8 lb

## 2019-03-05 DIAGNOSIS — J9611 Chronic respiratory failure with hypoxia: Secondary | ICD-10-CM | POA: Diagnosis not present

## 2019-03-05 DIAGNOSIS — N3000 Acute cystitis without hematuria: Secondary | ICD-10-CM | POA: Diagnosis not present

## 2019-03-05 DIAGNOSIS — E119 Type 2 diabetes mellitus without complications: Secondary | ICD-10-CM | POA: Diagnosis not present

## 2019-03-05 DIAGNOSIS — N390 Urinary tract infection, site not specified: Secondary | ICD-10-CM

## 2019-03-05 HISTORY — DX: Urinary tract infection, site not specified: N39.0

## 2019-03-05 LAB — POCT GLYCOSYLATED HEMOGLOBIN (HGB A1C): Hemoglobin A1C: 6.2 % — AB (ref 4.0–5.6)

## 2019-03-05 NOTE — Patient Instructions (Signed)
Stop by the lab prior to leaving today. I will notify you of your results once received.   Follow up with the heart doctor and lung doctor.  Please notify me if you have any more stomach pain.  It was a pleasure to see you today!

## 2019-03-05 NOTE — Assessment & Plan Note (Signed)
Agree that hospital bed would be appropriate. We will get our nurse manager involved to find out the hold up on her bed.  Continue home oxygen at 4 liters.  Lungs clear today.

## 2019-03-05 NOTE — Assessment & Plan Note (Signed)
Evaluated and treated at [facility]. CT scan with evidence of cystitis, no other acute abnormality.  UA and culture positive. Treated in ED. Doing better now. Alert and oriented, exam unremarkable.   ED notes, labs, imaging reviewed.

## 2019-03-05 NOTE — Progress Notes (Addendum)
Subjective:    Patient ID: Denise [name], female    DOB: 08/15/1945, 74 y.o.   MRN: [id number]  HPI  This visit occurred during the SARS-CoV-2 public health emergency.  Safety protocols were in place, including screening questions prior to the visit, additional usage of staff PPE, and extensive cleaning of exam room while observing appropriate contact time as indicated for disinfecting solutions.   Denise [name] is a 74 year old female with a significant medical history including COPD with chronic hypoxia, type 2 diabetes, CVA, pulmonary hypertension, CHF, anxiety and depression who presents today for ED follow up.  She presented to [facility] on 02/24/19 with a chief complaint of generalized abdominal pain with increased symptoms to mid and upper abdomen that began the day prior. Also with nausea without vomiting.   During her stay in the ED she was noted to be significantly hypertensive from her baseline. She underwent repeat CT abdomen/pelvis (last one in mid January 2021) which showed bladder wall thickening, anasarca that was similar to prior CT, no acute changes. UA with nitrites and many bacteria.  Urine culture returned a few days later with E coli positive infection. Treated with a dose of antibiotics in the ED, not discharged home with oral medications.   Today she's feeling better, denies abdominal pain and nausea. She continues to participate with home health PT, is getting stronger and able to participate in ADL's. She is compliant to her 4 liters of oxygen continuously.   They would really like a hospital bed to aid with sleeping, improve breathing during the night, would like the ability to have the head raised.   Due to COPD with chronic hypoxia patient requires frequent changes in body position AND/OR has an immediate need for a change in body position.  She also has:  [name] The patient has a medical condition which requires positioning of the body in ways not feasible with an  ordinary bed.  . The patient requires the head of the bed to be elevated more than 30 degrees most of the time due to congestive heart failure, chronic pulmonary disease, or problems with aspiration.   She is checking her blood sugars once daily in the morning fasting which is running 110's-130's. She is compliant to her glipizide 5 mg.   Wt Readings from Last 3 Encounters:  03/05/19 91 lb 12 oz (41.6 kg)  02/26/19 88 lb 6.4 oz (40.1 kg)  02/24/19 88 lb (39.9 kg)   BP Readings from Last 3 Encounters:  03/05/19 100/68  02/26/19 92/60  02/25/19 (!) 150/90     Review of Systems  Gastrointestinal: Negative for abdominal pain and nausea.  Genitourinary: Negative for dysuria.  Psychiatric/Behavioral: Negative for decreased concentration.       Past Medical History:  Diagnosis Date  . Altered mental status   . Anxiety and depression   . Chronic respiratory failure with hypoxia ([place]) 10/02/2017   Assoc with ? Cor pulmonale dx 09/2017 so rx 02 1-2 lpm 24/7  - 10/17/2017  Walked RA x 3 laps @ 185 ft each stopped due to  End of study, nl pace,  desat to 87 on 3rd lap - 12/19/2017  Saturations on Room Air at Rest =91 % and while Ambulating = 87%  But  on  2 Liters of pulsed oxygen while Ambulating =93% so rec POC 2lpm walking / use at rest if sats under 90%      . Colon cancer ([place]) 1988   Resected  .  COPD (chronic obstructive pulmonary disease) ([place])   . Diabetes mellitus without complication ([name])    diet controlled- no meds. per pt  . Diarrhea 04/02/2018  . Diverticulosis   . Hyperlipidemia   . Hypertension   . Hypoxia 10/30/2018  . Insomnia   . Primary hyperparathyroidism ([place])   . SBO (small bowel obstruction) ([place])   . Stroke ([place]) 10/2018   tpa administered  . Tubular adenoma of rectum 02/23/2013   low grade  . Vitamin D deficiency      Social History   Socioeconomic History  . Marital status: Married    Spouse name: Not on file  . Number of children: 3  . Years of  education: Not on file  . Highest education level: Not on file  Occupational History  . Not on file  Tobacco Use  . Smoking status: Former Smoker    Packs/day: 1.00    Years: 30.00    Pack years: 30.00    Types: Cigarettes    Quit date: 1989    Years since quitting: 32.1  . Smokeless tobacco: Never Used  Substance and Sexual Activity  . Alcohol use: No  . Drug use: No  . Sexual activity: Not Currently    Partners: Male    Birth control/protection: Post-menopausal, Surgical  Other Topics Concern  . Not on file  Social History Narrative   Married. 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 Difficulty of Paying Living Expenses: Not on file  Food Insecurity:   . Worried About Charity fundraiser in the Last Year: Not on file  . Ran Out of Food in the Last Year: Not on file  Transportation Needs:   . Lack of Transportation (Medical): Not on file  . Lack of Transportation (Non-Medical): Not on file  Physical Activity:   . Days of Exercise per Week: Not on file  . Minutes of Exercise per Session: Not on file  Stress:   . Feeling of Stress : Not on file  Social Connections:   . Frequency of Communication with Friends and Family: Not on file  . Frequency of Social Gatherings with Friends and Family: Not on file  . Attends Religious Services: Not on file  . Active Member of Clubs or Organizations: Not on file  . Attends Archivist Meetings: Not on file  . Marital Status: Not on file  Intimate Partner Violence:   . Fear of Current or Ex-Partner: Not on file  . Emotionally Abused: Not on file  . Physically Abused: Not on file  . Sexually Abused: Not on file    Past Surgical History:  Procedure Laterality Date  . ABDOMINAL HYSTERECTOMY  08/2015  . BREAST BIOPSY Left   . BREAST EXCISIONAL BIOPSY Right   . COLON  RESECTION  1980's  . COLONOSCOPY    . ENDOMETRIAL BIOPSY  2009   negative  . INCONTINENCE SURGERY  2017  . LOOP RECORDER INSERTION N/A 11/08/2018   Procedure: LOOP RECORDER INSERTION;  Surgeon: Constance Haw, MD;  Location: [facility] CV LAB;  Service: Cardiovascular;  Laterality: N/A;  . RIGHT HEART CATH N/A 09/20/2017   Procedure: RIGHT HEART CATH;  Surgeon: Larey Dresser, MD;  Location: [place] CV LAB;  Service: Cardiovascular;  Laterality: N/A;  .  RIGHT/LEFT HEART CATH AND CORONARY ANGIOGRAPHY N/A 03/19/2018   Procedure: RIGHT/LEFT HEART CATH AND CORONARY ANGIOGRAPHY;  Surgeon: Larey Dresser, MD;  Location: [place] CV LAB;  Service: Cardiovascular;  Laterality: N/A;    Family History  Problem Relation Age of Onset  . Ovarian cancer Mother   . Breast cancer Neg Hx   . Hyperparathyroidism Neg Hx   . Colon cancer Neg Hx   . Esophageal cancer Neg Hx   . Stomach cancer Neg Hx     No Known Allergies  Current Outpatient Medications on File Prior to Visit  Medication Sig Dispense Refill  . albuterol (VENTOLIN HFA) 108 (90 Base) MCG/ACT inhaler INHALE 1-2 PUFFS INTO THE LUNGS EVERY 4 HOURS AS NEEDED FOR WHEEZING OR SHORTNESS OF BREATH. (Patient taking differently: Inhale 1-2 puffs into the lungs every 4 (four) hours as needed for wheezing or shortness of breath. ) 6.7 g 5  . atorvastatin (LIPITOR) 10 MG tablet     . atorvastatin (LIPITOR) 20 MG tablet Take 1 tablet (20 mg total) by mouth every evening. For cholesterol (Patient taking differently: Take 20 mg by mouth at bedtime. For cholesterol) 90 tablet 3  . Blood Glucose Calibration (ACCU-CHEK AVIVA) SOLN USE AS INSTRUCTED TO TEST BLOOD SUGAR DAILY (Patient taking differently: 1 each by Other route daily. ) 1 each 2  . Blood Glucose Monitoring Suppl (ACCU-CHEK AVIVA PLUS) w/Device KIT Use as instructed to test blood sugar up to 3 times daily (Patient taking differently: 1 each by Other route 3 (three) times daily. ) 1  kit 0  . famotidine (PEPCID) 40 MG tablet Take 1 tablet twice 180 tablet 0  . fluticasone furoate-vilanterol (BREO ELLIPTA) 100-25 MCG/INH AEPB Inhale 1 puff into the lungs daily.    . furosemide (LASIX) 40 MG tablet Take 40 mg by mouth daily.     [name] glipiZIDE (GLUCOTROL) 5 MG tablet Take 1 tablet (5 mg total) by mouth daily before breakfast. 90 tablet 1  . glucose blood (ACCU-CHEK AVIVA PLUS) test strip Use as instructed to test blood sugar up to 3 times daily 300 each 1  . KLOR-CON M10 10 MEQ tablet TAKE 2 TABLETS (20 MEQ TOTAL) BY MOUTH DAILY. FOR LOW POTASSIUM. 180 tablet 1  . Lancets (ACCU-CHEK SOFT TOUCH) lancets Use as instructed to test blood sugar up to 3 times daily (Patient taking differently: 1 each by Other route 3 (three) times daily. ) 300 each 1  . metoprolol tartrate (LOPRESSOR) 25 MG tablet TAKE 0.5 TABLETS (12.5 MG TOTAL) BY MOUTH 2 (TWO) TIMES DAILY. (Patient taking differently: Take 12.5 mg by mouth 2 (two) times daily with a meal. ) 90 tablet 0  . mirtazapine (REMERON) 15 MG tablet TAKE 1 TABLET BY MOUTH AT BEDTIME. FOR DEPRESSION AND APPETITE. (Patient taking differently: Take 15 mg by mouth at bedtime. ) 90 tablet 3  . OXYGEN Inhale 4 L into the lungs continuous.     . polyethylene glycol powder (GLYCOLAX/MIRALAX) 17 GM/SCOOP powder Take 17 g by mouth daily as needed for mild constipation (TO BE MIXED INTO 4-8 OUNCES OF LIQUID, THEN CONSUMED).     . rivaroxaban (XARELTO) 10 MG TABS tablet Take 1 tablet (10 mg total) by mouth daily. For stroke prevention. 90 tablet 3  . spironolactone (ALDACTONE) 25 MG tablet Take 1 tablet (25 mg total) by mouth daily. Take at night 30 tablet 3  . umeclidinium bromide (INCRUSE ELLIPTA) 62.5 MCG/INH AEPB Inhale 1 puff into the lungs daily.  No current facility-administered medications on file prior to visit.    BP 100/68   Pulse 88   Temp (!) 96.6 F (35.9 C) (Temporal)   Ht _0  (1.651 m)   Wt 91 lb 12 oz (41.6 kg)   SpO2 99%   BMI  15.27 kg/m    Objective:   Physical Exam  Constitutional: She appears well-nourished.  Cardiovascular: Normal rate and regular rhythm.  Respiratory: Effort normal and breath sounds normal.  GI: Soft. Bowel sounds are normal. There is no abdominal tenderness.  Musculoskeletal:     Cervical back: Neck supple.  Skin: Skin is warm and dry.           Assessment & Plan:

## 2019-03-05 NOTE — Assessment & Plan Note (Addendum)
Glucose readings improved, continue Glipizide. Repeat A1C pending.

## 2019-03-05 NOTE — Telephone Encounter (Signed)
LVM to see if pt received the hospital bed yet.

## 2019-03-05 NOTE — Telephone Encounter (Signed)
Gave the approval for the verbal orders

## 2019-03-05 NOTE — Progress Notes (Signed)
Patient was evaluated by PCP today with good report. Patient had no complaints on my arrival. Medications were confirmed and reviewed. Pill box filled accordingly. Patient agreed for one week follow up. Medication visit complete.   Refill-  KLOR CON 10

## 2019-03-05 NOTE — Telephone Encounter (Signed)
Denise [name], This patient still has yet to receive a hospital bed or any updates regarding a hospital bed.  I believe you were working on this before, can you assist?

## 2019-03-06 DIAGNOSIS — I69354 Hemiplegia and hemiparesis following cerebral infarction affecting left non-dominant side: Secondary | ICD-10-CM | POA: Diagnosis not present

## 2019-03-06 DIAGNOSIS — I110 Hypertensive heart disease with heart failure: Secondary | ICD-10-CM | POA: Diagnosis not present

## 2019-03-06 DIAGNOSIS — I272 Pulmonary hypertension, unspecified: Secondary | ICD-10-CM | POA: Diagnosis not present

## 2019-03-06 DIAGNOSIS — J9611 Chronic respiratory failure with hypoxia: Secondary | ICD-10-CM | POA: Diagnosis not present

## 2019-03-06 DIAGNOSIS — E119 Type 2 diabetes mellitus without complications: Secondary | ICD-10-CM | POA: Diagnosis not present

## 2019-03-06 DIAGNOSIS — J9622 Acute and chronic respiratory failure with hypercapnia: Secondary | ICD-10-CM | POA: Diagnosis not present

## 2019-03-06 DIAGNOSIS — I5043 Acute on chronic combined systolic (congestive) and diastolic (congestive) heart failure: Secondary | ICD-10-CM | POA: Diagnosis not present

## 2019-03-06 DIAGNOSIS — F419 Anxiety disorder, unspecified: Secondary | ICD-10-CM | POA: Diagnosis not present

## 2019-03-06 DIAGNOSIS — J439 Emphysema, unspecified: Secondary | ICD-10-CM | POA: Diagnosis not present

## 2019-03-06 NOTE — Telephone Encounter (Signed)
Mr Denise [name] advised of everything

## 2019-03-06 NOTE — Telephone Encounter (Signed)
Contacted Darlene at [facility] who reports she has faxed the order 3 times.  She gave the number to [facility] as [phone].  [place] and spoke with Latoya. Order was received. She reports on 1/15 they contacted Courtney at [facility] and advised they were unable to provide hospital bed. Loma Sousa said she would send a msg.  Latoya reports at the time they did not have any hospital beds. She talked to intake and they said they do have beds now and will resubmit the order and reach out soon. Gave number to office.

## 2019-03-06 NOTE — Telephone Encounter (Signed)
LVM on pt's home and cell number. Contacted pt's sister and advised about hospital bed and she reports she will tell pt.  She also asked that the company contact her. Advised her number would be given.   Contacted [facility] and updated with sister's number. They verbalized understanding.

## 2019-03-06 NOTE — Telephone Encounter (Signed)
Thank you!! Can we update the patient?

## 2019-03-06 NOTE — Telephone Encounter (Signed)
Contacted Darlene at [facility] who reports she has faxed the order 3 times.  She gave the number to [facility] as [phone].  Contacted [facility] and spoke with Latoya. Order was received. She reports on 1/15 they contacted Courtney at [facility] and advised they were unable to provide hospital bed. Courtney said she would send a msg.  Latoya reports at the time they did not have any hospital beds. She talked to intake and they said they do have beds now and will resubmit the order and reach out soon. Gave number to office. 

## 2019-03-06 NOTE — Telephone Encounter (Signed)
Message left for patient to return my call.

## 2019-03-08 DIAGNOSIS — Z7952 Long term (current) use of systemic steroids: Secondary | ICD-10-CM

## 2019-03-08 DIAGNOSIS — Z86010 Personal history of colonic polyps: Secondary | ICD-10-CM

## 2019-03-08 DIAGNOSIS — J9622 Acute and chronic respiratory failure with hypercapnia: Secondary | ICD-10-CM

## 2019-03-08 DIAGNOSIS — Z9181 History of falling: Secondary | ICD-10-CM

## 2019-03-08 DIAGNOSIS — Z87891 Personal history of nicotine dependence: Secondary | ICD-10-CM

## 2019-03-08 DIAGNOSIS — I69354 Hemiplegia and hemiparesis following cerebral infarction affecting left non-dominant side: Secondary | ICD-10-CM | POA: Diagnosis not present

## 2019-03-08 DIAGNOSIS — F329 Major depressive disorder, single episode, unspecified: Secondary | ICD-10-CM

## 2019-03-08 DIAGNOSIS — E559 Vitamin D deficiency, unspecified: Secondary | ICD-10-CM

## 2019-03-08 DIAGNOSIS — E43 Unspecified severe protein-calorie malnutrition: Secondary | ICD-10-CM

## 2019-03-08 DIAGNOSIS — I5043 Acute on chronic combined systolic (congestive) and diastolic (congestive) heart failure: Secondary | ICD-10-CM | POA: Diagnosis not present

## 2019-03-08 DIAGNOSIS — J449 Chronic obstructive pulmonary disease, unspecified: Secondary | ICD-10-CM

## 2019-03-08 DIAGNOSIS — Z9981 Dependence on supplemental oxygen: Secondary | ICD-10-CM

## 2019-03-08 DIAGNOSIS — E210 Primary hyperparathyroidism: Secondary | ICD-10-CM

## 2019-03-08 DIAGNOSIS — K219 Gastro-esophageal reflux disease without esophagitis: Secondary | ICD-10-CM

## 2019-03-08 DIAGNOSIS — E785 Hyperlipidemia, unspecified: Secondary | ICD-10-CM

## 2019-03-08 DIAGNOSIS — I110 Hypertensive heart disease with heart failure: Secondary | ICD-10-CM | POA: Diagnosis not present

## 2019-03-08 DIAGNOSIS — Z85038 Personal history of other malignant neoplasm of large intestine: Secondary | ICD-10-CM

## 2019-03-08 DIAGNOSIS — E119 Type 2 diabetes mellitus without complications: Secondary | ICD-10-CM | POA: Diagnosis not present

## 2019-03-08 DIAGNOSIS — J439 Emphysema, unspecified: Secondary | ICD-10-CM | POA: Diagnosis not present

## 2019-03-08 DIAGNOSIS — I272 Pulmonary hypertension, unspecified: Secondary | ICD-10-CM

## 2019-03-08 DIAGNOSIS — J9611 Chronic respiratory failure with hypoxia: Secondary | ICD-10-CM | POA: Diagnosis not present

## 2019-03-08 DIAGNOSIS — G4700 Insomnia, unspecified: Secondary | ICD-10-CM

## 2019-03-08 DIAGNOSIS — F419 Anxiety disorder, unspecified: Secondary | ICD-10-CM

## 2019-03-10 DIAGNOSIS — I5043 Acute on chronic combined systolic (congestive) and diastolic (congestive) heart failure: Secondary | ICD-10-CM | POA: Diagnosis not present

## 2019-03-10 DIAGNOSIS — I69354 Hemiplegia and hemiparesis following cerebral infarction affecting left non-dominant side: Secondary | ICD-10-CM | POA: Diagnosis not present

## 2019-03-10 DIAGNOSIS — J9611 Chronic respiratory failure with hypoxia: Secondary | ICD-10-CM | POA: Diagnosis not present

## 2019-03-10 DIAGNOSIS — J9622 Acute and chronic respiratory failure with hypercapnia: Secondary | ICD-10-CM | POA: Diagnosis not present

## 2019-03-10 DIAGNOSIS — J439 Emphysema, unspecified: Secondary | ICD-10-CM | POA: Diagnosis not present

## 2019-03-10 DIAGNOSIS — I272 Pulmonary hypertension, unspecified: Secondary | ICD-10-CM | POA: Diagnosis not present

## 2019-03-10 DIAGNOSIS — I110 Hypertensive heart disease with heart failure: Secondary | ICD-10-CM | POA: Diagnosis not present

## 2019-03-10 DIAGNOSIS — F419 Anxiety disorder, unspecified: Secondary | ICD-10-CM | POA: Diagnosis not present

## 2019-03-10 DIAGNOSIS — E119 Type 2 diabetes mellitus without complications: Secondary | ICD-10-CM | POA: Diagnosis not present

## 2019-03-11 DIAGNOSIS — J449 Chronic obstructive pulmonary disease, unspecified: Secondary | ICD-10-CM | POA: Diagnosis not present

## 2019-03-11 DIAGNOSIS — I5022 Chronic systolic (congestive) heart failure: Secondary | ICD-10-CM | POA: Diagnosis not present

## 2019-03-12 ENCOUNTER — Encounter: Payer: Self-pay | Admitting: Adult Health

## 2019-03-12 ENCOUNTER — Other Ambulatory Visit (HOSPITAL_COMMUNITY): Payer: Self-pay

## 2019-03-12 ENCOUNTER — Ambulatory Visit: Payer: Medicare HMO | Admitting: Adult Health

## 2019-03-12 ENCOUNTER — Other Ambulatory Visit: Payer: Self-pay

## 2019-03-12 VITALS — BP 142/80 | HR 80 | Temp 97.9°F | Wt 91.0 lb

## 2019-03-12 DIAGNOSIS — J439 Emphysema, unspecified: Secondary | ICD-10-CM | POA: Diagnosis not present

## 2019-03-12 DIAGNOSIS — Z8673 Personal history of transient ischemic attack (TIA), and cerebral infarction without residual deficits: Secondary | ICD-10-CM

## 2019-03-12 DIAGNOSIS — I10 Essential (primary) hypertension: Secondary | ICD-10-CM | POA: Diagnosis not present

## 2019-03-12 DIAGNOSIS — J9622 Acute and chronic respiratory failure with hypercapnia: Secondary | ICD-10-CM | POA: Diagnosis not present

## 2019-03-12 DIAGNOSIS — E785 Hyperlipidemia, unspecified: Secondary | ICD-10-CM | POA: Diagnosis not present

## 2019-03-12 DIAGNOSIS — I480 Paroxysmal atrial fibrillation: Secondary | ICD-10-CM | POA: Diagnosis not present

## 2019-03-12 DIAGNOSIS — J9611 Chronic respiratory failure with hypoxia: Secondary | ICD-10-CM | POA: Diagnosis not present

## 2019-03-12 DIAGNOSIS — E1169 Type 2 diabetes mellitus with other specified complication: Secondary | ICD-10-CM | POA: Diagnosis not present

## 2019-03-12 DIAGNOSIS — I272 Pulmonary hypertension, unspecified: Secondary | ICD-10-CM | POA: Diagnosis not present

## 2019-03-12 DIAGNOSIS — I69354 Hemiplegia and hemiparesis following cerebral infarction affecting left non-dominant side: Secondary | ICD-10-CM | POA: Diagnosis not present

## 2019-03-12 DIAGNOSIS — E119 Type 2 diabetes mellitus without complications: Secondary | ICD-10-CM | POA: Diagnosis not present

## 2019-03-12 DIAGNOSIS — F419 Anxiety disorder, unspecified: Secondary | ICD-10-CM | POA: Diagnosis not present

## 2019-03-12 DIAGNOSIS — I5043 Acute on chronic combined systolic (congestive) and diastolic (congestive) heart failure: Secondary | ICD-10-CM | POA: Diagnosis not present

## 2019-03-12 DIAGNOSIS — I110 Hypertensive heart disease with heart failure: Secondary | ICD-10-CM | POA: Diagnosis not present

## 2019-03-12 NOTE — Progress Notes (Signed)
[facility] [address] [place]. [place] [address] [phone]       HOSPITAL FOLLOW UP NOTE  [name] Date of Birth:  12/13/1945 Medical Record Number:  [id number]   Reason for Referral:  hospital stroke follow up    CHIEF COMPLAINT:  Chief Complaint  Patient presents with  . Cerebrovascular Accident    rm 9, hospital FU, husband- [name]    HPI: Denise [name] being seen today for in office hospital follow-up regarding multiple strokes.  History obtained from patient, husband and chart review. Reviewed all radiology images and labs personally.  Denise [name] a 74 y.o.femalewith history of diabetes, hypertension, hyperlipidemia, colon cancer,COPD,chronic respiratory failure with hypoxia, systolic CHF with DOE presented on 11/04/2018 with slurred speech and left sided weakness. Recently hospitalized for acute on chronic respiratory failure w/ hypoxia and hypercapnia, likely COPD exacerbation and diastolic HF with ongoing SOB.  Evaluated by stroke team and Dr. Erlinda Hong with stroke work-up revealing right cerebellar infarct s/p TPA with hemorrhagic transformation, infarct likely embolic secondary to unknown source.  Repeat CT head on 11/5 to follow-up on HT showed new small to moderate lateral left cerebellar likely secondary to high-grade tandem stenosis left VA in setting of relatively low BP.  Initial MRI also showed small cortical infarcts right MCA somewhat petechial hemorrhage, multiple old small hemorrhagic and nonhemorrhagic bilateral cortical infarcts and old right thalamic and left cerebellar lacunes.  MRA showed intracranial arthrosclerosis most significant moderate L VA tandem stenosis.  Recommended to place loop recorder to rule out atrial fibrillation.  Recommended increasing home dose of aspirin 81 mg daily to 325 mg daily.  Hypertensive urgency during admission verbalized with long-term BP goal normotensive range and  avoidance of low BP due to left VA high-grade stenosis.  LDL 91 and recommended increasing atorvastatin from 10 mg to 20 mg daily.  Controlled DM with A1c 7.0.  Other stroke risk factors include advanced age, former tobacco use and diastolic CHF.  Other active problems include chronic respiratory failure with hypoxia, COPD no 4L O2, anxiety/depression, history of colon cancer, primary hyperparathyroidism, CKD stage III and severe malnutrition due to chronic illness.  Discharged to CIR on 11/08/2018 for ongoing therapy needs and discharged home on 11/19/2018 for home health therapies.  Return to ED 11/27/2018 with worsening left-sided weakness and increased lethargy.  Evaluated by stroke team and Dr. Leonie Man with stroke work-up revealing new right thalamic infarct due to small vessel disease in setting of recent L cerebellar, R cerebellar s/p TPA with hemorrhagic transformation and right MCA infarcts.  Initially recommended DAPT for 3 weeks and then aspirin alone due to recurrent stroke and known intracranial stenosis by loop recorder did show evidence of atrial fibrillation therefore DAPT discontinued and initiated Eliquis.  Evaluated by therapies who recommended discharge to SNF.  Mr. Mastel is a 74 year old female who is being seen today for hospital follow-up accompanied by her husband.  Since most recent stroke admission, she has been hospitalized multiple times with nonneurological concerns and was referred to palliative care for assistance on further advanced care planning.  She has been overall stable from a stroke standpoint with residual LUE weakness and gait impairment but overall improving. Continues to work with home health PT/OT.  She is able to ambulate with a rolling walker short distances but also limited due to respiratory concerns.  Use of 4 L O2 via [place] at all times.  Currently sitting in wheelchair.  Initially placed on Eliquis but  due to financial reasons, transition to Xarelto which she has  continued on without bleeding or bruising.  Continues on atorvastatin 20 mg daily without myalgias.  Blood pressure today 142/80.  Glucose levels stable.  Continues to follow with cardiology and PCP regularly.  No further concerns at this time.         ROS:   14 system review of systems performed and negative with exception of shortness of breath, weakness, gait impairment  PMH:  Past Medical History:  Diagnosis Date  . Altered mental status   . Anxiety and depression   . Chronic respiratory failure with hypoxia ([place]) 10/02/2017   Assoc with ? Cor pulmonale dx 09/2017 so rx 02 1-2 lpm 24/7  - 10/17/2017  Walked RA x 3 laps @ 185 ft each stopped due to  End of study, nl pace,  desat to 87 on 3rd lap - 12/19/2017  Saturations on Room Air at Rest =91 % and while Ambulating = 87%  But  on  2 Liters of pulsed oxygen while Ambulating =93% so rec POC 2lpm walking / use at rest if sats under 90%      . Colon cancer ([place]) 1988   Resected  . COPD (chronic obstructive pulmonary disease) ([place])   . Diabetes mellitus without complication ([name])    diet controlled- no meds. per pt  . Diarrhea 04/02/2018  . Diverticulosis   . Hyperlipidemia   . Hypertension   . Hypoxia 10/30/2018  . Insomnia   . Primary hyperparathyroidism ([place])   . SBO (small bowel obstruction) ([place])   . Stroke ([place]) 10/2018   tpa administered  . Tubular adenoma of rectum 02/23/2013   low grade  . Vitamin D deficiency     PSH:  Past Surgical History:  Procedure Laterality Date  . ABDOMINAL HYSTERECTOMY  08/2015  . BREAST BIOPSY Left   . BREAST EXCISIONAL BIOPSY Right   . COLON RESECTION  1980's  . COLONOSCOPY    . ENDOMETRIAL BIOPSY  2009   negative  . INCONTINENCE SURGERY  2017  . LOOP RECORDER INSERTION N/A 11/08/2018   Procedure: LOOP RECORDER INSERTION;  Surgeon: Constance Haw, MD;  Location: [facility];  Service: Cardiovascular;  Laterality: N/A;  . RIGHT HEART CATH N/A 09/20/2017   Procedure:  RIGHT HEART CATH;  Surgeon: Larey Dresser, MD;  Location: [facility];  Service: Cardiovascular;  Laterality: N/A;  . RIGHT/LEFT HEART CATH AND CORONARY ANGIOGRAPHY N/A 03/19/2018   Procedure: RIGHT/LEFT HEART CATH AND CORONARY ANGIOGRAPHY;  Surgeon: Larey Dresser, MD;  Location: [facility];  Service: Cardiovascular;  Laterality: N/A;    Social History:  Social History   Socioeconomic History  . Marital status: Married    Spouse name: Chalmers Cater  . Number of children: 3  . Years of education: Not on file  . Highest education level: Not on file  Occupational History    Comment: retired Quarry manager  Tobacco Use  . Smoking status: Former Smoker    Packs/day: 1.00    Years: 30.00    Pack years: 30.00    Types: Cigarettes    Quit date: 1989    Years since quitting: 32.2  . Smokeless tobacco: Never Used  Substance and Sexual Activity  . Alcohol use: No  . Drug use: No  . Sexual activity: Not Currently    Partners: Male    Birth control/protection: Post-menopausal, Surgical  Other Topics Concern  . Not on file  Social History Narrative   03/12/19 living with husband, Married. 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 Difficulty of Paying Living Expenses:   Food Insecurity:   . Worried About Charity fundraiser in the Last Year:   . Arboriculturist in the Last Year:   Transportation Needs:   . Film/video editor (Medical):   [name] Lack of Transportation (Non-Medical):   Physical Activity:   . Days of Exercise per Week:   . Minutes of Exercise per Session:   Stress:   . Feeling of Stress :   Social Connections:   . Frequency of Communication with Friends and Family:   . Frequency of Social Gatherings with Friends and Family:   . Attends Religious Services:   . Active Member of Clubs or Organizations:   . Attends  Archivist Meetings:   [name] Marital Status:   Intimate Partner Violence:   . Fear of Current or Ex-Partner:   . Emotionally Abused:   [name] Physically Abused:   . Sexually Abused:     Family History:  Family History  Problem Relation Age of Onset  . Ovarian cancer Mother   . Breast cancer Neg Hx   . Hyperparathyroidism Neg Hx   . Colon cancer Neg Hx   . Esophageal cancer Neg Hx   . Stomach cancer Neg Hx     Medications:   Current Outpatient Medications on File Prior to Visit  Medication Sig Dispense Refill  . albuterol (VENTOLIN HFA) 108 (90 Base) MCG/ACT inhaler INHALE 1-2 PUFFS INTO THE LUNGS EVERY 4 HOURS AS NEEDED FOR WHEEZING OR SHORTNESS OF BREATH. (Patient taking differently: Inhale 1-2 puffs into the lungs every 4 (four) hours as needed for wheezing or shortness of breath. ) 6.7 g 5  . atorvastatin (LIPITOR) 20 MG tablet Take 1 tablet (20 mg total) by mouth every evening. For cholesterol (Patient taking differently: Take 20 mg by mouth at bedtime. For cholesterol) 90 tablet 3  . Blood Glucose Calibration (ACCU-CHEK AVIVA) SOLN USE AS INSTRUCTED TO TEST BLOOD SUGAR DAILY (Patient taking differently: 1 each by Other route daily. ) 1 each 2  . Blood Glucose Monitoring Suppl (ACCU-CHEK AVIVA PLUS) w/Device KIT Use as instructed to test blood sugar up to 3 times daily (Patient taking differently: 1 each by Other route 3 (three) times daily. ) 1 kit 0  . famotidine (PEPCID) 40 MG tablet Take 1 tablet twice 180 tablet 0  . fluticasone furoate-vilanterol (BREO ELLIPTA) 100-25 MCG/INH AEPB Inhale 1 puff into the lungs daily.    . furosemide (LASIX) 40 MG tablet Take 40 mg by mouth daily.     [name] glipiZIDE (GLUCOTROL) 5 MG tablet Take 1 tablet (5 mg total) by mouth daily before breakfast. 90 tablet 1  . glucose blood (ACCU-CHEK AVIVA PLUS) test strip Use as instructed to test blood sugar up to 3 times daily 300 each 1  . KLOR-CON M10 10 MEQ tablet TAKE 2 TABLETS (20 MEQ TOTAL) BY MOUTH  DAILY. FOR LOW POTASSIUM. 180 tablet 1  . Lancets (ACCU-CHEK SOFT TOUCH) lancets Use as instructed to test blood sugar up to 3 times daily (Patient taking differently: 1 each by Other route 3 (three) times daily. ) 300 each 1  . metoprolol tartrate (LOPRESSOR)  25 MG tablet TAKE 0.5 TABLETS (12.5 MG TOTAL) BY MOUTH 2 (TWO) TIMES DAILY. (Patient taking differently: Take 12.5 mg by mouth 2 (two) times daily with a meal. ) 90 tablet 0  . mirtazapine (REMERON) 15 MG tablet TAKE 1 TABLET BY MOUTH AT BEDTIME. FOR DEPRESSION AND APPETITE. (Patient taking differently: Take 15 mg by mouth at bedtime. ) 90 tablet 3  . OXYGEN Inhale 4 L into the lungs continuous.     . polyethylene glycol powder (GLYCOLAX/MIRALAX) 17 GM/SCOOP powder Take 17 g by mouth daily as needed for mild constipation (TO BE MIXED INTO 4-8 OUNCES OF LIQUID, THEN CONSUMED).     . rivaroxaban (XARELTO) 10 MG TABS tablet Take 1 tablet (10 mg total) by mouth daily. For stroke prevention. 90 tablet 3  . spironolactone (ALDACTONE) 25 MG tablet Take 1 tablet (25 mg total) by mouth daily. Take at night 30 tablet 3  . umeclidinium bromide (INCRUSE ELLIPTA) 62.5 MCG/INH AEPB Inhale 1 puff into the lungs daily.     No current facility-administered medications on file prior to visit.    Allergies:  No Known Allergies   Physical Exam  Vitals:   03/12/19 1512  BP: (!) 142/80  Pulse: 80  Temp: 97.9 F (36.6 C)  Weight: 91 lb (41.3 kg)   Body mass index is 15.14 kg/m. No exam data present   General: Cachectic frail pleasant elderly African-American female, seated, in no evident distress Head: head normocephalic and atraumatic.   Neck: supple with no carotid or supraclavicular bruits Cardiovascular: irregular rate and rhythm, no murmurs Musculoskeletal: no deformity Skin:  no rash/petichiae Vascular:  Normal pulses all extremities   Neurologic Exam Mental Status: Awake and fully alert.   Normal speech and language.  Oriented to place  and time. Recent and remote memory intact. Attention span, concentration and fund of knowledge appropriate. Mood and affect appropriate.  Cranial Nerves: Fundoscopic exam reveals sharp disc margins. Pupils equal, briskly reactive to light. Extraocular movements full without nystagmus. Visual fields full to confrontation. Hearing intact. Facial sensation intact. Left facial weakness Motor: Normal bulk and tone.  Generalized mild weakness throughout all extremities greater LUE of full testing limited due to complaints of LUE pain (chronic per husband) Sensory.: intact to touch , pinprick , position and vibratory sensation.  Coordination: Rapid alternating movements normal in all extremities except decreased left hand. Finger-to-nose and heel-to-shin performed accurately bilaterally. Gait and Station: Deferred as patient currently in wheelchair Reflexes: 1+ and symmetric. Toes downgoing.     NIHSS  1 Modified Rankin  3-4 CHA2DS2-VASc 8 HAS-BLED 3   Diagnostic Data (Labs, Imaging, Testing)   Code Stroke CT head No acute abnormality. Old L frontal lobe infarct.Small vessel disease.   CT head R cerebellar infarct w/ interval hemorrhagic transformation 8cc w/ mass effect.  CT head repeat no significant change   MRI Large R cerebellar infarct w/ posterior mass effect. Small cortical infarcts R MCA some with petechial hemorrhage. Multiple old small hemorrhagic and non-hemorrhagic B cortical infarcts. Old R thalamic and L cerebellar lacunes.  MRA No LVO. Intracranial atherosclerosis (most significant is moderate L VA tandem stenosis)  Repeat CT 11/5 new small to moderate lateral L cerebellar infarct. Unchanged R cerebellar infarct w/ hemorrhagic transformation. Unchanged small R cerebral convexity cortical infarcts  Carotid DopplerB ICA 1-39% stenosis, VAs antegrade  2D Echo EF 60-65%. No source of embolus. Consider amyloidosis  LE venous doppler negative for DVT  loop recorder  placed 11/6  LDL91  HgbA1c7.0    CT head subacute R cerebellar hemorrhagic infarct decreasing in size. Subacute L cerebellar non hemorrhagic infarct decreasing in size. nonacute L frontal lobe encephalomalacia. Small vessel white matter disease.   CTA head & neck no LVO. Severe stenosis L VA origin w/o limiting flow. Moderate stenosis L VA.   MRI  R thalamocapsular infarct. Evolution large R cerebellar and moderate L cerebellar infarcts. Chronic infarcts cerebral cortex, R thalamic and B cerebellar. Small vessel disease. Atrophy.   LDL 91  HgbA1c 7.0    ASSESSMENT: Denise [name] is a 74 y.o. year old female is being seen today for follow-up regarding recurrent strokes initially cryptogenic left cerebellar, right cerebellar and right MCA infarcts s/p TPA with hemorrhagic transformation on 11/04/2018 and right thalamic infarct likely secondary to small vessel disease on 11/27/2018.  Loop recorder showed atrial fibrillation on 12/02/2018 and initially placed on Eliquis but due to cost, transition to Eliquis.  Vascular risk factors include new diagnosis of atrial fibrillation, L VA stenosis, uncontrolled HTN, HLD, DM, advanced age, former tobacco use, and diastolic CHF.  Also has multiple comorbidities with chronic respiratory failure with hypoxia, COPD, history of colon cancer, CKD stage III, severe malnutrition and abnormal EKG with prolonged QTC and PACs.  Recovered well from a stroke standpoint with residual mild LUE weakness.  Due to multiple hospitalizations, continues to be followed by palliative care outpatient.    PLAN:  1. Multiple strokes: Continue Xarelto (rivaroxaban) daily  and atorvastatin for secondary stroke prevention. Maintain strict control of hypertension with blood pressure goal below 130/90, diabetes with hemoglobin A1c goal below 6.5% and cholesterol with LDL cholesterol (bad cholesterol) goal below 70 mg/dL.  I also advised the patient to eat a healthy diet with  plenty of whole grains, cereals, fruits and vegetables, exercise regularly with at least 30 minutes of continuous activity daily and maintain ideal body weight. 2. Atrial fibrillation: new dx 11/30 found on the recorder.  Continue on Xarelto (unable to afford Eliquis) and ongoing follow-up with cardiology for monitoring and management 3. HTN: Advised to continue current treatment regimen.  Today's BP stable.  Advised to continue to monitor at home along with continued follow-up with PCP for management 4. HLD: Advised to continue current treatment regimen along with continued follow-up with PCP for future prescribing and monitoring of lipid panel 5. DMII: Advised to continue to monitor glucose levels at home along with continued follow-up with PCP for management and monitoring 6. Recommend continued participation with home health therapies for ongoing improvement of overall function and residual stroke LUE weakness    Follow up in 3 months or call earlier if needed   Greater than 50% of time during this 45 minute visit was spent on counseling, explanation of diagnosis of multiple strokes, reviewing risk factor management of atrial fibrillation, HTN, HLD and DM, planning of further management along with potential future management, and discussion with patient and family answering all questions.    Frann Rider, AGNP-BC  [facility] [address] [place], [place] [address]  Phone [phone] Fax [phone] Note: This document was prepared with digital dictation and possible smart phrase technology. Any transcriptional errors that result from this process are unintentional.

## 2019-03-12 NOTE — Progress Notes (Signed)
Paramedicine Encounter    Patient ID: Denise [name], female    DOB: 1945/02/23, 74 y.o.   MRN: [id number]   Patient Care Team: [name], NP as PCP - General (Internal Medicine) [name], MD as Consulting Physician (Cardiology)  Patient Active Problem List   Diagnosis Date Noted  . UTI (urinary tract infection) 03/05/2019  . Acute on chronic respiratory failure with hypoxia ([place]) 12/30/2018  . Acute on chronic diastolic CHF (congestive heart failure) ([place]) 12/30/2018  . Acute ischemic right MCA stroke ([place]) 11/27/2018  . Vaginal candidiasis   . Acute blood loss anemia   . Supplemental oxygen dependent   . ICH (intracerebral hemorrhage) ([place]) s/p tPA administration 11/08/2018  . Hypertensive urgency 11/08/2018  . Cerebellar stroke, acute ([place]) 11/08/2018  . History of ischemic stroke 11/04/2018  . COPD with acute exacerbation ([place]) 10/28/2018  . Acute on chronic systolic CHF (congestive heart failure) ([place]) 10/28/2018  . Elevated troponin 10/28/2018  . Lactic acidosis 10/28/2018  . GERD (gastroesophageal reflux disease) 10/28/2018  . Acute on chronic respiratory failure with hypercapnia ([place]) 10/28/2018  . On home oxygen therapy 07/12/2018  . CHF (congestive heart failure) ([place]) 05/16/2018  . Chronic respiratory failure with hypoxia and hypercapnia ([place]) 05/03/2018  . Borderline abnormal TFTs 05/02/2018  . Type 2 diabetes mellitus ([place]) 04/26/2018  . Unintended weight loss 04/26/2018  . Lower extremity edema 02/20/2018  . Chronic respiratory failure with hypoxia ([place]) 10/02/2017  . Hematuria 09/27/2017  . Pulmonary hypertension ([name]) c/w cor pulmonale   . Protein-calorie malnutrition, severe 09/17/2017  . Constipation   . Hyperparathyroidism, primary ([place]) 07/27/2016  . Loss of appetite 06/02/2016  . COPD GOLD  II  spirometry, severe Emphysema and 02 dep 11/22/2015  . Vitamin D deficiency 11/22/2015  . Anxiety and depression 11/22/2015  . Insomnia  11/22/2015  . Hyperlipidemia 11/22/2015  . Essential hypertension 11/22/2015  . Hx of colonic polyps 02/10/2013  . History of colon cancer 06/11/1986    Current Outpatient Medications:  .  albuterol (VENTOLIN HFA) 108 (90 Base) MCG/ACT inhaler, INHALE 1-2 PUFFS INTO THE LUNGS EVERY 4 HOURS AS NEEDED FOR WHEEZING OR SHORTNESS OF BREATH. (Patient taking differently: Inhale 1-2 puffs into the lungs every 4 (four) hours as needed for wheezing or shortness of breath. ), Disp: 6.7 g, Rfl: 5 .  atorvastatin (LIPITOR) 20 MG tablet, Take 1 tablet (20 mg total) by mouth every evening. For cholesterol (Patient taking differently: Take 20 mg by mouth at bedtime. For cholesterol), Disp: 90 tablet, Rfl: 3 .  Blood Glucose Calibration (ACCU-CHEK AVIVA) SOLN, USE AS INSTRUCTED TO TEST BLOOD SUGAR DAILY (Patient taking differently: 1 each by Other route daily. ), Disp: 1 each, Rfl: 2 .  Blood Glucose Monitoring Suppl (ACCU-CHEK AVIVA PLUS) w/Device KIT, Use as instructed to test blood sugar up to 3 times daily (Patient taking differently: 1 each by Other route 3 (three) times daily. ), Disp: 1 kit, Rfl: 0 .  famotidine (PEPCID) 40 MG tablet, Take 1 tablet twice, Disp: 180 tablet, Rfl: 0 .  fluticasone furoate-vilanterol (BREO ELLIPTA) 100-25 MCG/INH AEPB, Inhale 1 puff into the lungs daily., Disp: , Rfl:  .  furosemide (LASIX) 40 MG tablet, Take 40 mg by mouth daily. , Disp: , Rfl:  .  glipiZIDE (GLUCOTROL) 5 MG tablet, Take 1 tablet (5 mg total) by mouth daily before breakfast., Disp: 90 tablet, Rfl: 1 .  glucose blood (ACCU-CHEK AVIVA PLUS) test strip, Use as instructed to test blood  sugar up to 3 times daily, Disp: 300 each, Rfl: 1 .  KLOR-CON M10 10 MEQ tablet, TAKE 2 TABLETS (20 MEQ TOTAL) BY MOUTH DAILY. FOR LOW POTASSIUM., Disp: 180 tablet, Rfl: 1 .  Lancets (ACCU-CHEK SOFT TOUCH) lancets, Use as instructed to test blood sugar up to 3 times daily (Patient taking differently: 1 each by Other route 3 (three)  times daily. ), Disp: 300 each, Rfl: 1 .  metoprolol tartrate (LOPRESSOR) 25 MG tablet, TAKE 0.5 TABLETS (12.5 MG TOTAL) BY MOUTH 2 (TWO) TIMES DAILY. (Patient taking differently: Take 12.5 mg by mouth 2 (two) times daily with a meal. ), Disp: 90 tablet, Rfl: 0 .  mirtazapine (REMERON) 15 MG tablet, TAKE 1 TABLET BY MOUTH AT BEDTIME. FOR DEPRESSION AND APPETITE. (Patient taking differently: Take 15 mg by mouth at bedtime. ), Disp: 90 tablet, Rfl: 3 .  OXYGEN, Inhale 4 L into the lungs continuous. , Disp: , Rfl:  .  polyethylene glycol powder (GLYCOLAX/MIRALAX) 17 GM/SCOOP powder, Take 17 g by mouth daily as needed for mild constipation (TO BE MIXED INTO 4-8 OUNCES OF LIQUID, THEN CONSUMED). , Disp: , Rfl:  .  rivaroxaban (XARELTO) 10 MG TABS tablet, Take 1 tablet (10 mg total) by mouth daily. For stroke prevention., Disp: 90 tablet, Rfl: 3 .  spironolactone (ALDACTONE) 25 MG tablet, Take 1 tablet (25 mg total) by mouth daily. Take at night, Disp: 30 tablet, Rfl: 3 .  umeclidinium bromide (INCRUSE ELLIPTA) 62.5 MCG/INH AEPB, Inhale 1 puff into the lungs daily., Disp:  , Rfl:  No Known Allergies   Social History   Socioeconomic History  . Marital status: Married    Spouse name: Denise [name]  . Number of children: 3  . Years of education: Not on file  . Highest education level: Not on file  Occupational History    Comment: retired Quarry manager  Tobacco Use  . Smoking status: Former Smoker    Packs/day: 1.00    Years: 30.00    Pack years: 30.00    Types: Cigarettes    Quit date: 1989    Years since quitting: 32.2  . Smokeless tobacco: Never Used  Substance and Sexual Activity  . Alcohol use: No  . Drug use: No  . Sexual activity: Not Currently    Partners: Male    Birth control/protection: Post-menopausal, Surgical  Other Topics Concern  . Not on file  Social History Narrative   03/12/19 living with husband, Married. 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 Difficulty of Paying Living Expenses: Not on file  Food Insecurity:   . Worried About Charity fundraiser in the Last Year: Not on file  . Ran Out of Food in the Last Year: Not on file  Transportation Needs:   . Lack of Transportation (Medical): Not on file  . Lack of Transportation (Non-Medical): Not on file  Physical Activity:   . Days of Exercise per Week: Not on file  . Minutes of Exercise per Session: Not on file  Stress:   . Feeling of Stress : Not on file  Social Connections:   . Frequency of Communication with Friends and Family: Not on file  . Frequency of Social Gatherings with Friends and Family: Not on file  . Attends Religious Services: Not  on file  . Active Member of Clubs or Organizations: Not on file  . Attends Archivist Meetings: Not on file  . Marital Status: Not on file  Intimate Partner Violence:   . Fear of Current or Ex-Partner: Not on file  . Emotionally Abused: Not on file  . Physically Abused: Not on file  . Sexually Abused: Not on file    Physical Exam Vitals reviewed.  Constitutional:      Appearance: She is normal weight.  HENT:     Head: Normocephalic.     Nose: Nose normal.     Mouth/Throat:     Mouth: Mucous membranes are moist.  Eyes:     Pupils: Pupils are equal, round, and reactive to light.  Cardiovascular:     Rate and Rhythm: Normal rate.     Pulses: Normal pulses.  Pulmonary:     Effort: Pulmonary effort is normal.  Abdominal:     General: Abdomen is flat.  Musculoskeletal:        General: Normal range of motion.     Cervical back: Normal range of motion.     Right lower leg: No edema.     Left lower leg: No edema.  Skin:    General: Skin is warm.  Neurological:     Mental Status: She is alert. Mental status is at baseline.  Psychiatric:        Mood and Affect: Mood normal.     Arrived for home  visit for [facility] who was alert and oriented seated in her chair in her room on her oxygen with no complaints. Denise [name] reports she is feeling good and well rested because she got her hospital bed delivered yesterday and slept good. Denise [name] stated she has had no trouble breathing more than normal. Vitals obtained and are as noted. Medications were reviewed and confirmed. Pill box filled. Home visit complete.    Refills: Spironalactone  Metoprolol KLOR-CON Mirtazapine   CBG- 131    Future Appointments  Date Time Provider [place]  03/24/2019  8:15 AM Burtis Junes, NP [facility]  03/24/2019  9:00 AM [facility] DEVICE REMOTES [facility]  05/11/2019  9:00 AM [facility] DEVICE REMOTES [facility]  05/26/2019  9:00 AM [facility] DEVICE REMOTES [facility]  05/26/2019 10:00 AM Tanda Rockers, MD [facility] None  06/25/2019  9:45 AM Frann Rider, NP [facility] None  06/26/2019  9:00 AM [facility] DEVICE REMOTES [facility]  07/28/2019  9:00 AM [facility] DEVICE REMOTES [facility]  08/28/2019  9:00 AM [facility] DEVICE REMOTES [facility]  09/29/2019  9:00 AM [facility] DEVICE REMOTES [facility]  10/30/2019  9:00 AM [facility] DEVICE REMOTES [facility]  12/01/2019  9:00 AM [facility] DEVICE REMOTES [facility]     ACTION: Home visit completed Next visit planned for one week

## 2019-03-12 NOTE — Patient Instructions (Signed)
Continue Xarelto (rivaroxaban) daily  and lipitor  for secondary stroke prevention  Continue to follow up with PCP regarding cholesterol, blood pressure and diabetes management   Continue to work with home health therapies for on going improvement of strength and ambulation  Continue to monitor blood pressure at home  Maintain strict control of hypertension with blood pressure goal below 130/90, diabetes with hemoglobin A1c goal below 6.5% and cholesterol with LDL cholesterol (bad cholesterol) goal below 70 mg/dL. I also advised the patient to eat a healthy diet with plenty of whole grains, cereals, fruits and vegetables, exercise regularly and maintain ideal body weight.  Followup in the future with me in 3 months or call earlier if needed       Thank you for coming to see [place] at [facility]. I hope we have been able to provide you high quality care today.  You may receive a patient satisfaction survey over the next few weeks. We would appreciate your feedback and comments so that we may continue to improve ourselves and the health of our patients.

## 2019-03-13 ENCOUNTER — Encounter: Payer: Self-pay | Admitting: Adult Health

## 2019-03-13 NOTE — Telephone Encounter (Signed)
LVM for pt's sister, Elnoria Howard, to see if pt received bed.

## 2019-03-17 DIAGNOSIS — F419 Anxiety disorder, unspecified: Secondary | ICD-10-CM | POA: Diagnosis not present

## 2019-03-17 DIAGNOSIS — I110 Hypertensive heart disease with heart failure: Secondary | ICD-10-CM | POA: Diagnosis not present

## 2019-03-17 DIAGNOSIS — J9611 Chronic respiratory failure with hypoxia: Secondary | ICD-10-CM | POA: Diagnosis not present

## 2019-03-17 DIAGNOSIS — I69354 Hemiplegia and hemiparesis following cerebral infarction affecting left non-dominant side: Secondary | ICD-10-CM | POA: Diagnosis not present

## 2019-03-17 DIAGNOSIS — J439 Emphysema, unspecified: Secondary | ICD-10-CM | POA: Diagnosis not present

## 2019-03-17 DIAGNOSIS — I5043 Acute on chronic combined systolic (congestive) and diastolic (congestive) heart failure: Secondary | ICD-10-CM | POA: Diagnosis not present

## 2019-03-17 DIAGNOSIS — I272 Pulmonary hypertension, unspecified: Secondary | ICD-10-CM | POA: Diagnosis not present

## 2019-03-17 DIAGNOSIS — J9622 Acute and chronic respiratory failure with hypercapnia: Secondary | ICD-10-CM | POA: Diagnosis not present

## 2019-03-17 DIAGNOSIS — E119 Type 2 diabetes mellitus without complications: Secondary | ICD-10-CM | POA: Diagnosis not present

## 2019-03-18 ENCOUNTER — Other Ambulatory Visit (HOSPITAL_COMMUNITY): Payer: Self-pay

## 2019-03-18 ENCOUNTER — Other Ambulatory Visit: Payer: Self-pay | Admitting: Physical Medicine and Rehabilitation

## 2019-03-18 DIAGNOSIS — E876 Hypokalemia: Secondary | ICD-10-CM

## 2019-03-18 NOTE — Progress Notes (Signed)
Paramedicine Encounter    Patient ID: Denise [name], female    DOB: 1945/02/23, 74 y.o.   MRN: [id number]   Patient Care Team: [name], NP as PCP - General (Internal Medicine) [name], MD as Consulting Physician (Cardiology)  Patient Active Problem List   Diagnosis Date Noted  . UTI (urinary tract infection) 03/05/2019  . Acute on chronic respiratory failure with hypoxia ([place]) 12/30/2018  . Acute on chronic diastolic CHF (congestive heart failure) ([place]) 12/30/2018  . Acute ischemic right MCA stroke ([place]) 11/27/2018  . Vaginal candidiasis   . Acute blood loss anemia   . Supplemental oxygen dependent   . ICH (intracerebral hemorrhage) ([place]) s/p tPA administration 11/08/2018  . Hypertensive urgency 11/08/2018  . Cerebellar stroke, acute ([place]) 11/08/2018  . History of ischemic stroke 11/04/2018  . COPD with acute exacerbation ([place]) 10/28/2018  . Acute on chronic systolic CHF (congestive heart failure) ([place]) 10/28/2018  . Elevated troponin 10/28/2018  . Lactic acidosis 10/28/2018  . GERD (gastroesophageal reflux disease) 10/28/2018  . Acute on chronic respiratory failure with hypercapnia ([place]) 10/28/2018  . On home oxygen therapy 07/12/2018  . CHF (congestive heart failure) ([place]) 05/16/2018  . Chronic respiratory failure with hypoxia and hypercapnia ([place]) 05/03/2018  . Borderline abnormal TFTs 05/02/2018  . Type 2 diabetes mellitus ([place]) 04/26/2018  . Unintended weight loss 04/26/2018  . Lower extremity edema 02/20/2018  . Chronic respiratory failure with hypoxia ([place]) 10/02/2017  . Hematuria 09/27/2017  . Pulmonary hypertension ([name]) c/w cor pulmonale   . Protein-calorie malnutrition, severe 09/17/2017  . Constipation   . Hyperparathyroidism, primary ([place]) 07/27/2016  . Loss of appetite 06/02/2016  . COPD GOLD  II  spirometry, severe Emphysema and 02 dep 11/22/2015  . Vitamin D deficiency 11/22/2015  . Anxiety and depression 11/22/2015  . Insomnia  11/22/2015  . Hyperlipidemia 11/22/2015  . Essential hypertension 11/22/2015  . Hx of colonic polyps 02/10/2013  . History of colon cancer 06/11/1986    Current Outpatient Medications:  .  albuterol (VENTOLIN HFA) 108 (90 Base) MCG/ACT inhaler, INHALE 1-2 PUFFS INTO THE LUNGS EVERY 4 HOURS AS NEEDED FOR WHEEZING OR SHORTNESS OF BREATH. (Patient taking differently: Inhale 1-2 puffs into the lungs every 4 (four) hours as needed for wheezing or shortness of breath. ), Disp: 6.7 g, Rfl: 5 .  atorvastatin (LIPITOR) 20 MG tablet, Take 1 tablet (20 mg total) by mouth every evening. For cholesterol (Patient taking differently: Take 20 mg by mouth at bedtime. For cholesterol), Disp: 90 tablet, Rfl: 3 .  Blood Glucose Calibration (ACCU-CHEK AVIVA) SOLN, USE AS INSTRUCTED TO TEST BLOOD SUGAR DAILY (Patient taking differently: 1 each by Other route daily. ), Disp: 1 each, Rfl: 2 .  Blood Glucose Monitoring Suppl (ACCU-CHEK AVIVA PLUS) w/Device KIT, Use as instructed to test blood sugar up to 3 times daily (Patient taking differently: 1 each by Other route 3 (three) times daily. ), Disp: 1 kit, Rfl: 0 .  famotidine (PEPCID) 40 MG tablet, Take 1 tablet twice, Disp: 180 tablet, Rfl: 0 .  fluticasone furoate-vilanterol (BREO ELLIPTA) 100-25 MCG/INH AEPB, Inhale 1 puff into the lungs daily., Disp: , Rfl:  .  furosemide (LASIX) 40 MG tablet, Take 40 mg by mouth daily. , Disp: , Rfl:  .  glipiZIDE (GLUCOTROL) 5 MG tablet, Take 1 tablet (5 mg total) by mouth daily before breakfast., Disp: 90 tablet, Rfl: 1 .  glucose blood (ACCU-CHEK AVIVA PLUS) test strip, Use as instructed to test blood  sugar up to 3 times daily, Disp: 300 each, Rfl: 1 .  KLOR-CON M10 10 MEQ tablet, TAKE 2 TABLETS (20 MEQ TOTAL) BY MOUTH DAILY. FOR LOW POTASSIUM., Disp: 180 tablet, Rfl: 1 .  Lancets (ACCU-CHEK SOFT TOUCH) lancets, Use as instructed to test blood sugar up to 3 times daily (Patient taking differently: 1 each by Other route 3 (three)  times daily. ), Disp: 300 each, Rfl: 1 .  metoprolol tartrate (LOPRESSOR) 25 MG tablet, TAKE 0.5 TABLETS (12.5 MG TOTAL) BY MOUTH 2 (TWO) TIMES DAILY. (Patient taking differently: Take 12.5 mg by mouth 2 (two) times daily with a meal. ), Disp: 90 tablet, Rfl: 0 .  mirtazapine (REMERON) 15 MG tablet, TAKE 1 TABLET BY MOUTH AT BEDTIME. FOR DEPRESSION AND APPETITE. (Patient taking differently: Take 15 mg by mouth at bedtime. ), Disp: 90 tablet, Rfl: 3 .  OXYGEN, Inhale 4 L into the lungs continuous. , Disp: , Rfl:  .  rivaroxaban (XARELTO) 10 MG TABS tablet, Take 1 tablet (10 mg total) by mouth daily. For stroke prevention., Disp: 90 tablet, Rfl: 3 .  spironolactone (ALDACTONE) 25 MG tablet, Take 1 tablet (25 mg total) by mouth daily. Take at night, Disp: 30 tablet, Rfl: 3 .  umeclidinium bromide (INCRUSE ELLIPTA) 62.5 MCG/INH AEPB, Inhale 1 puff into the lungs daily., Disp:  , Rfl:  .  polyethylene glycol powder (GLYCOLAX/MIRALAX) 17 GM/SCOOP powder, Take 17 g by mouth daily as needed for mild constipation (TO BE MIXED INTO 4-8 OUNCES OF LIQUID, THEN CONSUMED). , Disp: , Rfl:  No Known Allergies   Social History   Socioeconomic History  . Marital status: Married    Spouse name: Chalmers Cater  . Number of children: 3  . Years of education: Not on file  . Highest education level: Not on file  Occupational History    Comment: retired Quarry manager  Tobacco Use  . Smoking status: Former Smoker    Packs/day: 1.00    Years: 30.00    Pack years: 30.00    Types: Cigarettes    Quit date: 1989    Years since quitting: 32.2  . Smokeless tobacco: Never Used  Substance and Sexual Activity  . Alcohol use: No  . Drug use: No  . Sexual activity: Not Currently    Partners: Male    Birth control/protection: Post-menopausal, Surgical  Other Topics Concern  . Not on file  Social History Narrative   03/12/19 living with husband, Married. 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 Difficulty of Paying Living Expenses:   Food Insecurity:   . Worried About Charity fundraiser in the Last Year:   . Arboriculturist in the Last Year:   Transportation Needs:   . Film/video editor (Medical):   [name] Lack of Transportation (Non-Medical):   Physical Activity:   . Days of Exercise per Week:   . Minutes of Exercise per Session:   Stress:   . Feeling of Stress :   Social Connections:   . Frequency of Communication with Friends and Family:   . Frequency of Social Gatherings with Friends and Family:   . Attends Religious Services:   . Active Member of Clubs or Organizations:   . Attends Archivist Meetings:   [name] Marital  Status:   Intimate Partner Violence:   . Fear of Current or Ex-Partner:   . Emotionally Abused:   [name] Physically Abused:   . Sexually Abused:     Physical Exam Vitals reviewed.  Constitutional:      Appearance: She is normal weight.  HENT:     Head: Normocephalic.     Nose: Nose normal.     Mouth/Throat:     Mouth: Mucous membranes are dry.  Eyes:     Pupils: Pupils are equal, round, and reactive to light.  Cardiovascular:     Rate and Rhythm: Normal rate and regular rhythm.     Pulses: Normal pulses.     Heart sounds: Normal heart sounds.  Pulmonary:     Effort: Pulmonary effort is normal.  Abdominal:     General: Abdomen is flat.     Palpations: Abdomen is soft.  Musculoskeletal:        General: Normal range of motion.     Cervical back: Normal range of motion.     Right lower leg: No edema.     Left lower leg: No edema.  Skin:    General: Skin is warm and dry.     Capillary Refill: Capillary refill takes less than 2 seconds.  Neurological:     Mental Status: She is alert. Mental status is at baseline.  Psychiatric:        Mood and Affect: Mood normal.     Arrived for home visit for [facility] who was seated  in her chair in her room alert and oriented with no complaints on assessment. Kenedee stated she had a good visit with her Neurologist last week with a good report to continue her PT weekly and check back in 6 months. PT is coming out once a week for 6 weeks. Patient reports she has had her full dose of Pfizer Vaccine with no side effects. Vitals were obtained. Assessment as noted. No trouble sleeping with new hospital bed. Medications were reviewed and verified. Pill box filled accordingly. Cierah agreed to visit next week on Wednesday. Home visit complete.   Refills: Metoprolol  [place]-Con   CBG- 115     Future Appointments  Date Time Provider [place]  03/24/2019  8:15 AM Burtis Junes, NP [facility]  03/24/2019  9:00 AM [facility] DEVICE REMOTES [facility]  16-May-2019  9:00 AM [facility] DEVICE REMOTES [facility]  05/26/2019  9:00 AM [facility] DEVICE REMOTES [facility]  05/26/2019 10:00 AM Tanda Rockers, MD [facility] None  06/25/2019  9:45 AM Frann Rider, NP [facility] None  06/26/2019  9:00 AM [facility] DEVICE REMOTES [facility]  07/28/2019  9:00 AM [facility] DEVICE REMOTES [facility]  08/28/2019  9:00 AM [facility] DEVICE REMOTES [facility]  09/29/2019  9:00 AM [facility] DEVICE REMOTES [facility]  10/30/2019  9:00 AM [facility] DEVICE REMOTES [facility]  12/01/2019  9:00 AM [facility] DEVICE REMOTES [facility]     ACTION: Home visit completed Next visit planned for Next Wedensday

## 2019-03-18 NOTE — Telephone Encounter (Signed)
LVM

## 2019-03-19 DIAGNOSIS — J9622 Acute and chronic respiratory failure with hypercapnia: Secondary | ICD-10-CM | POA: Diagnosis not present

## 2019-03-19 DIAGNOSIS — J439 Emphysema, unspecified: Secondary | ICD-10-CM | POA: Diagnosis not present

## 2019-03-19 DIAGNOSIS — J9611 Chronic respiratory failure with hypoxia: Secondary | ICD-10-CM | POA: Diagnosis not present

## 2019-03-19 DIAGNOSIS — E119 Type 2 diabetes mellitus without complications: Secondary | ICD-10-CM | POA: Diagnosis not present

## 2019-03-19 DIAGNOSIS — I272 Pulmonary hypertension, unspecified: Secondary | ICD-10-CM | POA: Diagnosis not present

## 2019-03-19 DIAGNOSIS — I69354 Hemiplegia and hemiparesis following cerebral infarction affecting left non-dominant side: Secondary | ICD-10-CM | POA: Diagnosis not present

## 2019-03-19 DIAGNOSIS — F419 Anxiety disorder, unspecified: Secondary | ICD-10-CM | POA: Diagnosis not present

## 2019-03-19 DIAGNOSIS — I5043 Acute on chronic combined systolic (congestive) and diastolic (congestive) heart failure: Secondary | ICD-10-CM | POA: Diagnosis not present

## 2019-03-19 DIAGNOSIS — I110 Hypertensive heart disease with heart failure: Secondary | ICD-10-CM | POA: Diagnosis not present

## 2019-03-19 NOTE — Progress Notes (Signed)
CARDIOLOGY OFFICE NOTE  Date:  03/24/2019    Denise [name] Date of Birth: 07-30-1945 Medical Record #[id number]  PCP:  [name], NP  Cardiologist:  ? Aundra Dubin in [facility]   Chief Complaint  Patient presents with  . Follow-up    History of Present Illness: Denise [name] is a 74 y.o. female who presents today for a follow up visit. Seems to see Dr. Aundra Dubin in [facility] for cardiology. Looks to have previously seen Dr. Johnsie Cancel - last seen by Vin back in October of 2019 - appears to have been followed in CHF since.   She has had multiple admissions/ER visits over the last several years.   She has a history of COPD - on home oxygen, pulmonary HTN, suspected HOCM, HLD, DM2, colon cancer with prior resection, hyperparathyroidism and HTN. She has had prior stroke 2020, on chronic anticoagulation with Xarelto. She has chronic hypoxic respiratory failure. She has had prior cath with Dr. Aundra Dubin in 2020. Has ILR in place. There is a prior remote check that showed AF - she is on Xarelto.   Looks to have last been seen by Darrick Grinder, NP in October of 2020.   She has had numerous ER visits and admissions. Last admitted in December. Last ER visit in February due to abdominal pain.   The patient does not have symptoms concerning for COVID-19 infection (fever, chills, cough, or new shortness of breath).   Comes in today. Here with her husband Denise [name]. He notes that he got very confused about where to go. Still identify Dr. Aundra Dubin as their doctor.  He augments the history. She is asking me about her glasses. He notes "things going ok". Getting therapy at home. Slowly getting stronger by his report. Her breathing continues to be her main problem. She is on chronic oxygen at 4l. No chest pain. Not dizzy or lightheaded. No swelling. Weight is down. They have both had their COVID vaccines.  Past Medical History:  Diagnosis Date  . Acute blood loss anemia   . Acute ischemic right MCA stroke  ([place]) 11/27/2018  . Acute on chronic diastolic CHF (congestive heart failure) ([place]) 12/30/2018  . Acute on chronic respiratory failure with hypercapnia ([place]) 10/28/2018  . Acute on chronic respiratory failure with hypoxia ([place]) 12/30/2018  . Altered mental status   . Anxiety and depression   . Borderline abnormal TFTs 05/02/2018  . Cerebellar stroke, acute ([place]) 11/08/2018  . CHF (congestive heart failure) ([place]) 05/16/2018  . Chronic respiratory failure with hypoxia ([place]) 10/02/2017   Assoc with ? Cor pulmonale dx 09/2017 so rx 02 1-2 lpm 24/7  - 10/17/2017  Walked RA x 3 laps @ 185 ft each stopped due to  End of study, nl pace,  desat to 87 on 3rd lap - 12/19/2017  Saturations on Room Air at Rest =91 % and while Ambulating = 87%  But  on  2 Liters of pulsed oxygen while Ambulating =93% so rec POC 2lpm walking / use at rest if sats under 90%      . Chronic respiratory failure with hypoxia and hypercapnia ([place]) 05/03/2018   Assoc with   Cor pulmonale dx 09/2017 so rx 02 1-2 lpm 24/7  - 10/17/2017  Walked RA x 3 laps @ 185 ft each stopped due to  End of study, nl pace,  desat to 87 on 3rd lap - 12/19/2017  Saturations on Room Air at Rest =91 % and while Ambulating =  87%  But  on  2 Liters of pulsed oxygen while Ambulating =93% so rec POC 2lpm walking / use at rest if sats under 90%  - HC03 05/02/2018  = 34  - HC03 11/16  . Colon cancer ([place]) 1988   Resected  . Constipation   . COPD (chronic obstructive pulmonary disease) ([place])   . COPD GOLD  II  spirometry, severe Emphysema and 02 dep 11/22/2015   Quit smoking 1989 - Spirometry 09/11/2016  FEV1 1.03 (52%)  Ratio 54 mild curvature, on no rx - 09/11/2016  After extensive coaching HFA effectiveness =    50% from baseline 10% > continue  symb 160 2bid but consider change to bevespi or stiolto  - 10/02/2017  After extensive coaching inhaler device,  effectiveness =    50% from a baseline near 0 > rechallenge with symbicort 160 x 2 bid x 2 weeks the  . COPD with  acute exacerbation ([place]) 10/28/2018  . Diabetes mellitus without complication ([name])    diet controlled- no meds. per pt  . Diarrhea 04/02/2018  . Diverticulosis   . Elevated troponin 10/28/2018  . Essential hypertension 11/22/2015  . GERD (gastroesophageal reflux disease) 10/28/2018  . Hematuria 09/27/2017  . History of colon cancer 06/11/1986   Resected 1988, [place]  . Hx of colonic polyps 02/10/2013  . Hyperlipidemia   . Hyperparathyroidism, primary ([place]) 07/27/2016  . Hypertension   . Hypertensive urgency 11/08/2018  . Hypoxia 10/30/2018  . ICH (intracerebral hemorrhage) ([place]) s/p tPA administration 11/08/2018  . Insomnia   . Lactic acidosis 10/28/2018  . Loss of appetite 06/02/2016  . Lower extremity edema 02/20/2018  . On home oxygen therapy 07/12/2018  . Primary hyperparathyroidism ([place])   . Protein-calorie malnutrition, severe 09/17/2017  . Pulmonary hypertension ([name]) c/w cor pulmonale    See [facility] 09/20/17  With  low CO ? Accuracy of PVR  - tyvaso d/c'd 10/03/18 by [facility] due to cough, improved     . SBO (small bowel obstruction) ([place])   . Stroke ([place]) 10/2018   tpa administered  . Supplemental oxygen dependent   . Tubular adenoma of rectum 02/23/2013   low grade  . Type 2 diabetes mellitus ([place]) 04/26/2018  . Unintended weight loss 04/26/2018  . UTI (urinary tract infection) 03/05/2019  . Vaginal candidiasis   . Vitamin D deficiency     Past Surgical History:  Procedure Laterality Date  . ABDOMINAL HYSTERECTOMY  08/2015  . BREAST BIOPSY Left   . BREAST EXCISIONAL BIOPSY Right   . COLON RESECTION  1980's  . COLONOSCOPY    . ENDOMETRIAL BIOPSY  2009   negative  . INCONTINENCE SURGERY  2017  . LOOP RECORDER INSERTION N/A 11/08/2018   Procedure: LOOP RECORDER INSERTION;  Surgeon: Constance Haw, MD;  Location: [facility];  Service: Cardiovascular;  Laterality: N/A;  . RIGHT HEART CATH N/A 09/20/2017   Procedure: RIGHT HEART CATH;  Surgeon: Larey Dresser,  MD;  Location: [facility];  Service: Cardiovascular;  Laterality: N/A;  . RIGHT/LEFT HEART CATH AND CORONARY ANGIOGRAPHY N/A 03/19/2018   Procedure: RIGHT/LEFT HEART CATH AND CORONARY ANGIOGRAPHY;  Surgeon: Larey Dresser, MD;  Location: [facility];  Service: Cardiovascular;  Laterality: N/A;     Medications: Current Meds  Medication Sig  . albuterol (VENTOLIN HFA) 108 (90 Base) MCG/ACT inhaler INHALE 1-2 PUFFS INTO THE LUNGS EVERY 4 HOURS AS NEEDED FOR WHEEZING OR SHORTNESS OF BREATH.  [name]  atorvastatin (LIPITOR) 20 MG tablet Take 1 tablet (20 mg total) by mouth every evening. For cholesterol  . Blood Glucose Calibration (ACCU-CHEK AVIVA) SOLN USE AS INSTRUCTED TO TEST BLOOD SUGAR DAILY  . Blood Glucose Monitoring Suppl (ACCU-CHEK AVIVA PLUS) w/Device KIT Use as instructed to test blood sugar up to 3 times daily  . famotidine (PEPCID) 40 MG tablet Take 1 tablet twice  . fluticasone furoate-vilanterol (BREO ELLIPTA) 100-25 MCG/INH AEPB Inhale 1 puff into the lungs daily.  . furosemide (LASIX) 40 MG tablet Take 40 mg by mouth daily.   [name] glipiZIDE (GLUCOTROL) 5 MG tablet Take 1 tablet (5 mg total) by mouth daily before breakfast.  . glucose blood (ACCU-CHEK AVIVA PLUS) test strip Use as instructed to test blood sugar up to 3 times daily  . KLOR-CON M10 10 MEQ tablet TAKE 2 TABLETS (20 MEQ TOTAL) BY MOUTH DAILY. FOR LOW POTASSIUM.  [name] Lancets (ACCU-CHEK SOFT TOUCH) lancets Use as instructed to test blood sugar up to 3 times daily  . metoprolol tartrate (LOPRESSOR) 25 MG tablet TAKE 0.5 TABLETS (12.5 MG TOTAL) BY MOUTH 2 (TWO) TIMES DAILY.  . mirtazapine (REMERON) 15 MG tablet TAKE 1 TABLET BY MOUTH AT BEDTIME. FOR DEPRESSION AND APPETITE.  [name] OXYGEN Inhale 4 L into the lungs continuous.   . polyethylene glycol powder (GLYCOLAX/MIRALAX) 17 GM/SCOOP powder Take 17 g by mouth daily as needed for mild constipation (TO BE MIXED INTO 4-8 OUNCES OF LIQUID, THEN CONSUMED).   . rivaroxaban  (XARELTO) 10 MG TABS tablet Take 1 tablet (10 mg total) by mouth daily. For stroke prevention.  [name] spironolactone (ALDACTONE) 25 MG tablet Take 1 tablet (25 mg total) by mouth daily. Take at night  . umeclidinium bromide (INCRUSE ELLIPTA) 62.5 MCG/INH AEPB Inhale 1 puff into the lungs daily.     Allergies: No Known Allergies  Social History: The patient  reports that she quit smoking about 32 years ago. Her smoking use included cigarettes. She has a 30.00 pack-year smoking history. She has never used smokeless tobacco. She reports that she does not drink alcohol or use drugs.   Family History: The patient's family history includes Ovarian cancer in her mother.   Review of Systems: Please see the history of present illness.   All other systems are reviewed and negative.   Physical Exam: VS:  BP 124/80   Pulse 84   Ht _0  (1.651 m)   Wt 92 lb 12.8 oz (42.1 kg)   SpO2 97%   BMI 15.44 kg/m  .  BMI Body mass index is 15.44 kg/m.  Wt Readings from Last 3 Encounters:  03/24/19 92 lb 12.8 oz (42.1 kg)  03/18/19 89 lb 9.6 oz (40.6 kg)  03/12/19 91 lb (41.3 kg)    General: Alert. She is quite thin. Looks chronically ill. Alert and in no acute distress. May be a bit confused.  She is in a wheelchair.  Cardiac: Somewhat irregular at the beginning of exam - then more regular - no edema noted.   Respiratory:  Lungs are clear to auscultation bilaterally with normal work of breathing.  GI: Soft and nontender.  MS: No deformity or atrophy. Gait not tested.  Skin: Warm and dry. Color is normal.  Neuro:  Strength and sensation are intact and no gross focal deficits noted.  Psych: Alert, appropriate and with normal affect.   LABORATORY DATA:  EKG:  EKG is not ordered today.   Lab Results  Component Value Date  WBC 5.0 02/25/2019   HGB 12.9 02/25/2019   HCT 41.2 02/25/2019   PLT 186 02/25/2019   GLUCOSE 118 (H) 02/25/2019   CHOL 170 11/05/2018   TRIG 85 11/05/2018   HDL 62  11/05/2018   LDLCALC 91 11/05/2018   ALT 16 02/25/2019   AST 23 02/25/2019   NA 136 02/25/2019   K 5.0 02/25/2019   CL 99 02/25/2019   CREATININE 1.29 (H) 02/25/2019   BUN 18 02/25/2019   CO2 27 02/25/2019   TSH 0.65 05/02/2018   INR 1.1 11/04/2018   HGBA1C 6.2 (A) 03/05/2019     BNP (last 3 results) Recent Labs    10/28/18 2044 12/30/18 1148 12/31/18 0332  BNP 1,586.2* 908.4* 785.1*    ProBNP (last 3 results) Recent Labs    05/02/18 1249 06/24/18 1111  PROBNP 910.0* 840.0*     Other Studies Reviewed Today:  ECHO IMPRESSIONS 11/2018  1. Left ventricular ejection fraction, by visual estimation, is 60 to  65%. The left ventricle has normal function. There is severely increased  left ventricular hypertrophy.  2. Left ventricular diastolic parameters are consistent with Grade I  diastolic dysfunction (impaired relaxation).  3. Global right ventricle has moderately reduced systolic function.The  right ventricular size is normal.  4. Left atrial size was mildly dilated.  5. Right atrial size was normal.  6. Trivial pericardial effusion is present.  7. The mitral valve is abnormal. Trace mitral valve regurgitation. No  evidence of mitral stenosis.  8. The tricuspid valve is normal in structure. Tricuspid valve  regurgitation is mild.  9. The aortic valve is tricuspid. Aortic valve regurgitation is trivial.  Mild aortic valve sclerosis without stenosis.  10. The pulmonic valve was normal in structure. Pulmonic valve  regurgitation is not visualized.  11. Moderately elevated pulmonary artery systolic pressure.  12. The inferior vena cava is normal in size with greater than 50%  respiratory variability, suggesting right atrial pressure of 3 mmHg.  13. Normal LV systolic function; grade 1 diastolic dysfunction; severe LVH  with speckled appearance; consider amyloid; mild LAE; moderate RV  dysfunction; trace AI.     RIGHT/LEFT HEART CATH AND CORONARY  ANGIOGRAPHY 03/2018  Conclusion  1. Near-normal filling pressures.   2. Moderate pulmonary arterial hypertension with PVR 8.5 WU (significantly elevated).  3. Low cardiac output.  4. Mild luminal irregularities on coronary angiogram.       Assessment/Plan:  1. Chronic shortness of breath - most likely from COPD and pulmonary HTN - on oxygen.   2. Chronic systolic HF with prominent RV failure - on very little medicines. PYP negative for amyloid in the past. Now on Lopressor/Aldactone. No longer on ARB or Bidil.  Recent labs noted. Overall prognosis looks poor to me - I think she would benefit to get her back to the CHF clinic for continued care. May need to look at palliative care - will defer to them since this is my first time every seeing her.   3. HTN - BP is fine.   4. HOCM  5. Pulmonary HTN - on anticoagulation - on oxygen.    6. Noted PAF on prior loop interrogation - on anticoagulation  7. Probable malnutrition/poor caloric intake   8. Chronic anticoagulation  9. Prior stroke  10. COVID-19 Education: The signs and symptoms of COVID-19 were discussed with the patient and how to seek care for testing (follow up with PCP or arrange E-visit).  The importance of social distancing, staying at  home, hand hygiene and wearing a mask when out in public were discussed today. They have both had their COVID vaccines.   Current medicines are reviewed with the patient today.  The patient does not have concerns regarding medicines other than what has been noted above.  The following changes have been made:  See above.  Labs/ tests ordered today include:   No orders of the defined types were placed in this encounter.    Disposition:   FU with CHF - will send message to their staff and see what can get arranged. Options may be limited going forward.    Patient is agreeable to this plan and will call if any problems develop in the interim.   SignedTruitt Merle, NP  03/24/2019  8:25 AM  [facility] [address] [place],   [address] Phone: [phone] Fax: [phone]

## 2019-03-24 ENCOUNTER — Encounter: Payer: Self-pay | Admitting: Nurse Practitioner

## 2019-03-24 ENCOUNTER — Other Ambulatory Visit: Payer: Self-pay

## 2019-03-24 ENCOUNTER — Ambulatory Visit (INDEPENDENT_AMBULATORY_CARE_PROVIDER_SITE_OTHER): Payer: Medicare HMO | Admitting: *Deleted

## 2019-03-24 ENCOUNTER — Telehealth (HOSPITAL_COMMUNITY): Payer: Self-pay | Admitting: Cardiology

## 2019-03-24 ENCOUNTER — Ambulatory Visit: Payer: Medicare HMO | Admitting: Nurse Practitioner

## 2019-03-24 VITALS — BP 124/80 | HR 84 | Ht 65.0 in | Wt 92.8 lb

## 2019-03-24 DIAGNOSIS — I63511 Cerebral infarction due to unspecified occlusion or stenosis of right middle cerebral artery: Secondary | ICD-10-CM

## 2019-03-24 DIAGNOSIS — I10 Essential (primary) hypertension: Secondary | ICD-10-CM | POA: Diagnosis not present

## 2019-03-24 DIAGNOSIS — I5042 Chronic combined systolic (congestive) and diastolic (congestive) heart failure: Secondary | ICD-10-CM | POA: Diagnosis not present

## 2019-03-24 DIAGNOSIS — J9611 Chronic respiratory failure with hypoxia: Secondary | ICD-10-CM | POA: Diagnosis not present

## 2019-03-24 DIAGNOSIS — I272 Pulmonary hypertension, unspecified: Secondary | ICD-10-CM | POA: Diagnosis not present

## 2019-03-24 DIAGNOSIS — I480 Paroxysmal atrial fibrillation: Secondary | ICD-10-CM

## 2019-03-24 DIAGNOSIS — Z7189 Other specified counseling: Secondary | ICD-10-CM

## 2019-03-24 DIAGNOSIS — Z7901 Long term (current) use of anticoagulants: Secondary | ICD-10-CM

## 2019-03-24 LAB — CUP PACEART REMOTE DEVICE CHECK
Date Time Interrogation Session: 20210321230440
Implantable Pulse Generator Implant Date: 20201106

## 2019-03-24 NOTE — Telephone Encounter (Signed)
-----  Message from Conrad [place], NP sent at 03/24/2019 12:44 PM EDT ----- Cecille Rubin  We will get her scheduled.   Hope all is well,  A ----- Message ----- From: Burtis Junes, NP Sent: 03/24/2019   8:35 AM EDT To: Conrad [place], NP  Amy,   Can we see about getting her back to your clinic? Cecille Rubin

## 2019-03-24 NOTE — Telephone Encounter (Signed)
Attempting to contact patient regarding follow up No answer on both numbers [facility]

## 2019-03-24 NOTE — Telephone Encounter (Signed)
Per staff message from Kevan Rosebush, RN, pt needs 4-6 wk f/u with Dr. Aundra Dubin.  Called and left message for pt to call [facility] to schedule appt.

## 2019-03-24 NOTE — Patient Instructions (Addendum)
After Visit Summary:  We will be checking the following labs today - NONE   Medication Instructions:    Continue with your current medicines.    If you need a refill on your cardiac medications before your next appointment, please call your pharmacy.     Testing/Procedures To Be Arranged:  N/A  Follow-Up:   See Dr. Aundra Dubin and his staff in about 4 to 6 weeks - we will get you back there to get established.     At [facility], you and your health needs are our priority.  As part of our continuing mission to provide you with exceptional heart care, we have created designated Provider Care Teams.  These Care Teams include your primary Cardiologist (physician) and Advanced Practice Providers (APPs -  Physician Assistants and Nurse Practitioners) who all work together to provide you with the care you need, when you need it.  Special Instructions:  . Stay safe, stay home, wash your hands for at least 20 seconds and wear a mask when out in public.  . It was good to talk with you today.    Call the [place] office at [phone] if you have any questions, problems or concerns.

## 2019-03-25 DIAGNOSIS — E119 Type 2 diabetes mellitus without complications: Secondary | ICD-10-CM | POA: Diagnosis not present

## 2019-03-25 DIAGNOSIS — I69354 Hemiplegia and hemiparesis following cerebral infarction affecting left non-dominant side: Secondary | ICD-10-CM | POA: Diagnosis not present

## 2019-03-25 DIAGNOSIS — I272 Pulmonary hypertension, unspecified: Secondary | ICD-10-CM | POA: Diagnosis not present

## 2019-03-25 DIAGNOSIS — I110 Hypertensive heart disease with heart failure: Secondary | ICD-10-CM | POA: Diagnosis not present

## 2019-03-25 DIAGNOSIS — I5043 Acute on chronic combined systolic (congestive) and diastolic (congestive) heart failure: Secondary | ICD-10-CM | POA: Diagnosis not present

## 2019-03-25 DIAGNOSIS — J439 Emphysema, unspecified: Secondary | ICD-10-CM | POA: Diagnosis not present

## 2019-03-25 DIAGNOSIS — J9611 Chronic respiratory failure with hypoxia: Secondary | ICD-10-CM | POA: Diagnosis not present

## 2019-03-25 DIAGNOSIS — F419 Anxiety disorder, unspecified: Secondary | ICD-10-CM | POA: Diagnosis not present

## 2019-03-25 DIAGNOSIS — J9622 Acute and chronic respiratory failure with hypercapnia: Secondary | ICD-10-CM | POA: Diagnosis not present

## 2019-03-25 NOTE — Telephone Encounter (Signed)
Scheduled patient for 4/5 1:30p. LM for patient to return call to confirm appt date and time.

## 2019-03-25 NOTE — Telephone Encounter (Signed)
-----  Message from Larey Dresser, MD sent at 03/24/2019  4:37 PM EDT ----- Not sure how she ended up over there.   Dougles Kimmey/[place],  Can you arrange f/u for Denise [name]? Thanks.  ----- Message ----- From: Burtis Junes, NP Sent: 03/24/2019   8:35 AM EDT To: Larey Dresser, MD  Amy,   Can we see about getting her back to your clinic? Cecille Rubin

## 2019-03-25 NOTE — Progress Notes (Signed)
ILR Remote 

## 2019-03-26 ENCOUNTER — Other Ambulatory Visit (HOSPITAL_COMMUNITY): Payer: Self-pay

## 2019-03-26 ENCOUNTER — Telehealth: Payer: Self-pay

## 2019-03-26 DIAGNOSIS — J9622 Acute and chronic respiratory failure with hypercapnia: Secondary | ICD-10-CM | POA: Diagnosis not present

## 2019-03-26 DIAGNOSIS — E119 Type 2 diabetes mellitus without complications: Secondary | ICD-10-CM | POA: Diagnosis not present

## 2019-03-26 DIAGNOSIS — J439 Emphysema, unspecified: Secondary | ICD-10-CM | POA: Diagnosis not present

## 2019-03-26 DIAGNOSIS — J9611 Chronic respiratory failure with hypoxia: Secondary | ICD-10-CM | POA: Diagnosis not present

## 2019-03-26 DIAGNOSIS — F419 Anxiety disorder, unspecified: Secondary | ICD-10-CM | POA: Diagnosis not present

## 2019-03-26 DIAGNOSIS — I5043 Acute on chronic combined systolic (congestive) and diastolic (congestive) heart failure: Secondary | ICD-10-CM | POA: Diagnosis not present

## 2019-03-26 DIAGNOSIS — I272 Pulmonary hypertension, unspecified: Secondary | ICD-10-CM | POA: Diagnosis not present

## 2019-03-26 DIAGNOSIS — I69354 Hemiplegia and hemiparesis following cerebral infarction affecting left non-dominant side: Secondary | ICD-10-CM | POA: Diagnosis not present

## 2019-03-26 DIAGNOSIS — I110 Hypertensive heart disease with heart failure: Secondary | ICD-10-CM | POA: Diagnosis not present

## 2019-03-26 NOTE — Progress Notes (Signed)
Paramedicine Encounter    Patient ID: Denise [name], female    DOB: 1945/12/02, 74 y.o.   MRN: [id number]   Patient Care Team: [name], NP as PCP - General (Internal Medicine) [name], MD as Consulting Physician (Cardiology)  Patient Active Problem List   Diagnosis Date Noted  . UTI (urinary tract infection) 03/05/2019  . Acute on chronic respiratory failure with hypoxia ([place]) 12/30/2018  . Acute on chronic diastolic CHF (congestive heart failure) ([place]) 12/30/2018  . Acute ischemic right MCA stroke ([place]) 11/27/2018  . Vaginal candidiasis   . Acute blood loss anemia   . Supplemental oxygen dependent   . ICH (intracerebral hemorrhage) ([place]) s/p tPA administration 11/08/2018  . Hypertensive urgency 11/08/2018  . Cerebellar stroke, acute ([place]) 11/08/2018  . History of ischemic stroke 11/04/2018  . COPD with acute exacerbation ([place]) 10/28/2018  . Acute on chronic systolic CHF (congestive heart failure) ([place]) 10/28/2018  . Elevated troponin 10/28/2018  . Lactic acidosis 10/28/2018  . GERD (gastroesophageal reflux disease) 10/28/2018  . Acute on chronic respiratory failure with hypercapnia ([place]) 10/28/2018  . On home oxygen therapy 07/12/2018  . CHF (congestive heart failure) ([place]) 05/16/2018  . Chronic respiratory failure with hypoxia and hypercapnia ([place]) 05/03/2018  . Borderline abnormal TFTs 05/02/2018  . Type 2 diabetes mellitus ([place]) 04/26/2018  . Unintended weight loss 04/26/2018  . Lower extremity edema 02/20/2018  . Chronic respiratory failure with hypoxia ([place]) 10/02/2017  . Hematuria 09/27/2017  . Pulmonary hypertension ([name]) c/w cor pulmonale   . Protein-calorie malnutrition, severe 09/17/2017  . Constipation   . Hyperparathyroidism, primary ([place]) 07/27/2016  . Loss of appetite 06/02/2016  . COPD GOLD  II  spirometry, severe Emphysema and 02 dep 11/22/2015  . Vitamin D deficiency 11/22/2015  . Anxiety and depression 11/22/2015  . Insomnia  11/22/2015  . Hyperlipidemia 11/22/2015  . Essential hypertension 11/22/2015  . Hx of colonic polyps 02/10/2013  . History of colon cancer 06/11/1986    Current Outpatient Medications:  .  albuterol (VENTOLIN HFA) 108 (90 Base) MCG/ACT inhaler, INHALE 1-2 PUFFS INTO THE LUNGS EVERY 4 HOURS AS NEEDED FOR WHEEZING OR SHORTNESS OF BREATH., Disp: 6.7 g, Rfl: 5 .  atorvastatin (LIPITOR) 20 MG tablet, Take 1 tablet (20 mg total) by mouth every evening. For cholesterol, Disp: 90 tablet, Rfl: 3 .  bisoprolol (ZEBETA) 5 MG tablet, Take 2.5 mg by mouth daily., Disp: , Rfl:  .  Blood Glucose Calibration (ACCU-CHEK AVIVA) SOLN, USE AS INSTRUCTED TO TEST BLOOD SUGAR DAILY, Disp: 1 each, Rfl: 2 .  Blood Glucose Monitoring Suppl (ACCU-CHEK AVIVA PLUS) w/Device KIT, Use as instructed to test blood sugar up to 3 times daily, Disp: 1 kit, Rfl: 0 .  famotidine (PEPCID) 40 MG tablet, Take 1 tablet twice, Disp: 180 tablet, Rfl: 0 .  fluticasone furoate-vilanterol (BREO ELLIPTA) 100-25 MCG/INH AEPB, Inhale 1 puff into the lungs daily., Disp: , Rfl:  .  furosemide (LASIX) 40 MG tablet, Take 40 mg by mouth daily. , Disp: , Rfl:  .  glipiZIDE (GLUCOTROL) 5 MG tablet, Take 1 tablet (5 mg total) by mouth daily before breakfast., Disp: 90 tablet, Rfl: 1 .  glucose blood (ACCU-CHEK AVIVA PLUS) test strip, Use as instructed to test blood sugar up to 3 times daily, Disp: 300 each, Rfl: 1 .  KLOR-CON M10 10 MEQ tablet, TAKE 2 TABLETS (20 MEQ TOTAL) BY MOUTH DAILY. FOR LOW POTASSIUM., Disp: 180 tablet, Rfl: 1 .  Lancets (ACCU-CHEK SOFT  TOUCH) lancets, Use as instructed to test blood sugar up to 3 times daily, Disp: 300 each, Rfl: 1 .  metoprolol tartrate (LOPRESSOR) 25 MG tablet, TAKE 0.5 TABLETS (12.5 MG TOTAL) BY MOUTH 2 (TWO) TIMES DAILY., Disp: 90 tablet, Rfl: 0 .  mirtazapine (REMERON) 15 MG tablet, TAKE 1 TABLET BY MOUTH AT BEDTIME. FOR DEPRESSION AND APPETITE., Disp: 90 tablet, Rfl: 3 .  OXYGEN, Inhale 4 L into the  lungs continuous. , Disp: , Rfl:  .  polyethylene glycol powder (GLYCOLAX/MIRALAX) 17 GM/SCOOP powder, Take 17 g by mouth daily as needed for mild constipation (TO BE MIXED INTO 4-8 OUNCES OF LIQUID, THEN CONSUMED). , Disp: , Rfl:  .  rivaroxaban (XARELTO) 10 MG TABS tablet, Take 1 tablet (10 mg total) by mouth daily. For stroke prevention., Disp: 90 tablet, Rfl: 3 .  spironolactone (ALDACTONE) 25 MG tablet, Take 1 tablet (25 mg total) by mouth daily. Take at night, Disp: 30 tablet, Rfl: 3 .  umeclidinium bromide (INCRUSE ELLIPTA) 62.5 MCG/INH AEPB, Inhale 1 puff into the lungs daily., Disp:  , Rfl:  No Known Allergies   Social History   Socioeconomic History  . Marital status: Married    Spouse name: Chalmers Cater  . Number of children: 3  . Years of education: Not on file  . Highest education level: Not on file  Occupational History    Comment: retired Quarry manager  Tobacco Use  . Smoking status: Former Smoker    Packs/day: 1.00    Years: 30.00    Pack years: 30.00    Types: Cigarettes    Quit date: 1989    Years since quitting: 32.2  . Smokeless tobacco: Never Used  Substance and Sexual Activity  . Alcohol use: No  . Drug use: No  . Sexual activity: Not Currently    Partners: Male    Birth control/protection: Post-menopausal, Surgical  Other Topics Concern  . Not on file  Social History Narrative   03/12/19 living with husband, Married. 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 Difficulty of Paying Living Expenses:   Food Insecurity:   . Worried About Charity fundraiser in the Last Year:   . Arboriculturist in the Last Year:   Transportation Needs:   . Film/video editor (Medical):   [name] Lack of Transportation (Non-Medical):   Physical Activity:   . Days of Exercise per Week:   . Minutes of Exercise per Session:   Stress:   .  Feeling of Stress :   Social Connections:   . Frequency of Communication with Friends and Family:   . Frequency of Social Gatherings with Friends and Family:   . Attends Religious Services:   . Active Member of Clubs or Organizations:   . Attends Archivist Meetings:   [name] Marital Status:   Intimate Partner Violence:   . Fear of Current or Ex-Partner:   . Emotionally Abused:   [name] Physically Abused:   . Sexually Abused:     Physical Exam Vitals reviewed.  Constitutional:      Appearance: She is normal weight.  HENT:     Head: Normocephalic.     Nose: Nose normal.     Mouth/Throat:     Mouth: Mucous membranes are moist.  Pharynx: Oropharynx is clear.  Eyes:     Pupils: Pupils are equal, round, and reactive to light.  Cardiovascular:     Rate and Rhythm: Normal rate and regular rhythm.     Pulses: Normal pulses.     Heart sounds: Normal heart sounds.  Pulmonary:     Effort: Pulmonary effort is normal.     Breath sounds: Normal breath sounds.  Abdominal:     General: Abdomen is flat.     Palpations: Abdomen is soft.  Musculoskeletal:        General: Normal range of motion.     Cervical back: Normal range of motion.     Right lower leg: No edema.     Left lower leg: No edema.  Skin:    General: Skin is warm and dry.     Capillary Refill: Capillary refill takes less than 2 seconds.  Neurological:     Mental Status: She is alert. Mental status is at baseline.  Psychiatric:        Mood and Affect: Mood normal.   Arrived for visit for [facility] who was alert and oriented seated in her chair with her oxygen on with no shortness of breath or difficulty breathing. Denise [name] had no complaints today on assessment. Vitals as noted in report. No edema. Lung sounds clear and diminished per her normal. Medications reviewed and confirmed. Pill box filled. Medications needed for refill : Metoprolol Breo Inhaler   Patient agreed to visit in one week. Home visit complete.        Future Appointments  Date Time Provider [place]  04/07/2019  1:30 PM [facility] PA/NP [facility] None  04/28/2019  7:55 AM [facility] DEVICE REMOTES [facility]  05/06/2019 11:40 AM Larey Dresser, MD [facility] None  05/26/2019 10:00 AM Tanda Rockers, MD [facility] None  06/04/2019  4:00 PM [facility] DEVICE REMOTES [facility]  06/25/2019  9:45 AM Frann Rider, NP [facility] None  07/09/2019  9:40 AM [facility] DEVICE REMOTES [facility]  08/11/2019  7:55 AM [facility] DEVICE REMOTES [facility]  09/15/2019  7:55 AM [facility] DEVICE REMOTES [facility]  10/20/2019  7:55 AM [facility] DEVICE REMOTES [facility]  11/24/2019  7:55 AM [facility] DEVICE REMOTES [facility]  12/29/2019  7:55 AM [facility] DEVICE REMOTES [facility]     ACTION: Home visit completed Next visit planned for one week

## 2019-03-26 NOTE — Telephone Encounter (Addendum)
Margaretha Sheffield PT with [facility] is at pts home now; pt has new complaint of lt wrist,hand and finger pain that is sharp pain that comes and goes. No known injury and pain level is 8. The pain is on the side pt had a stroke and pain seems to worsen with activity or moving fingers. Pt has no arm or chest pain. [name] heart rate is more irregular because pts husband forgot to give pt any meds this morning. pts husband will ck BS to see if gives pt glipizide; pts BS now is 170. pts husband will give pt her meds for today now. Pt P 88 R 32 BP 124/82 and pulse ox is 94% and pt is on 4 L of oxygen.  [facility] PT [facility] said that pt is not in any respiratory distress.[facility] [address] Margaretha Sheffield request cb about what to do for new complaint of lt wrist,hand and finger pain.

## 2019-03-27 NOTE — Telephone Encounter (Signed)
Noted. If her left wrist and finger pain persist I am happy to see her in the office for evaluation. As long as she is in no respiratory distress and vitals are stable, okay to monitor.  Pulse ox reading of 94% is stable for her.

## 2019-03-27 NOTE — Telephone Encounter (Signed)
Spoken and notified Margaretha Sheffield of [place] comments. Will call patient's husband as well with the information.

## 2019-03-28 NOTE — Telephone Encounter (Signed)
Message left for patient's husband to return my call. 

## 2019-03-29 DIAGNOSIS — J9611 Chronic respiratory failure with hypoxia: Secondary | ICD-10-CM | POA: Diagnosis not present

## 2019-03-30 DIAGNOSIS — J9611 Chronic respiratory failure with hypoxia: Secondary | ICD-10-CM | POA: Diagnosis not present

## 2019-03-30 DIAGNOSIS — R600 Localized edema: Secondary | ICD-10-CM | POA: Diagnosis not present

## 2019-03-30 DIAGNOSIS — E119 Type 2 diabetes mellitus without complications: Secondary | ICD-10-CM | POA: Diagnosis not present

## 2019-03-30 DIAGNOSIS — J439 Emphysema, unspecified: Secondary | ICD-10-CM | POA: Diagnosis not present

## 2019-03-30 DIAGNOSIS — I509 Heart failure, unspecified: Secondary | ICD-10-CM | POA: Diagnosis not present

## 2019-03-30 DIAGNOSIS — J9612 Chronic respiratory failure with hypercapnia: Secondary | ICD-10-CM | POA: Diagnosis not present

## 2019-03-30 DIAGNOSIS — E43 Unspecified severe protein-calorie malnutrition: Secondary | ICD-10-CM | POA: Diagnosis not present

## 2019-03-30 DIAGNOSIS — J449 Chronic obstructive pulmonary disease, unspecified: Secondary | ICD-10-CM | POA: Diagnosis not present

## 2019-03-30 DIAGNOSIS — I272 Pulmonary hypertension, unspecified: Secondary | ICD-10-CM | POA: Diagnosis not present

## 2019-03-31 NOTE — Telephone Encounter (Signed)
Patient's husband would like you to call his cell #  [phone]

## 2019-03-31 NOTE — Telephone Encounter (Addendum)
Message left for patient's husband to return my call.

## 2019-03-31 NOTE — Telephone Encounter (Signed)
Patient's Husband Returned your call He did state that patient is doing better today

## 2019-04-01 DIAGNOSIS — I272 Pulmonary hypertension, unspecified: Secondary | ICD-10-CM | POA: Diagnosis not present

## 2019-04-01 DIAGNOSIS — J9622 Acute and chronic respiratory failure with hypercapnia: Secondary | ICD-10-CM | POA: Diagnosis not present

## 2019-04-01 DIAGNOSIS — I110 Hypertensive heart disease with heart failure: Secondary | ICD-10-CM | POA: Diagnosis not present

## 2019-04-01 DIAGNOSIS — E119 Type 2 diabetes mellitus without complications: Secondary | ICD-10-CM | POA: Diagnosis not present

## 2019-04-01 DIAGNOSIS — I69354 Hemiplegia and hemiparesis following cerebral infarction affecting left non-dominant side: Secondary | ICD-10-CM | POA: Diagnosis not present

## 2019-04-01 DIAGNOSIS — J439 Emphysema, unspecified: Secondary | ICD-10-CM | POA: Diagnosis not present

## 2019-04-01 DIAGNOSIS — J9611 Chronic respiratory failure with hypoxia: Secondary | ICD-10-CM | POA: Diagnosis not present

## 2019-04-01 DIAGNOSIS — F419 Anxiety disorder, unspecified: Secondary | ICD-10-CM | POA: Diagnosis not present

## 2019-04-01 DIAGNOSIS — I5043 Acute on chronic combined systolic (congestive) and diastolic (congestive) heart failure: Secondary | ICD-10-CM | POA: Diagnosis not present

## 2019-04-02 ENCOUNTER — Other Ambulatory Visit (HOSPITAL_COMMUNITY): Payer: Self-pay

## 2019-04-02 DIAGNOSIS — J9611 Chronic respiratory failure with hypoxia: Secondary | ICD-10-CM | POA: Diagnosis not present

## 2019-04-02 NOTE — Progress Notes (Signed)
Paramedicine Encounter    Patient ID: Denise [name], female    DOB: 1945/12/02, 74 y.o.   MRN: [id number]   Patient Care Team: [name], NP as PCP - General (Internal Medicine) [name], MD as Consulting Physician (Cardiology)  Patient Active Problem List   Diagnosis Date Noted  . UTI (urinary tract infection) 03/05/2019  . Acute on chronic respiratory failure with hypoxia ([place]) 12/30/2018  . Acute on chronic diastolic CHF (congestive heart failure) ([place]) 12/30/2018  . Acute ischemic right MCA stroke ([place]) 11/27/2018  . Vaginal candidiasis   . Acute blood loss anemia   . Supplemental oxygen dependent   . ICH (intracerebral hemorrhage) ([place]) s/p tPA administration 11/08/2018  . Hypertensive urgency 11/08/2018  . Cerebellar stroke, acute ([place]) 11/08/2018  . History of ischemic stroke 11/04/2018  . COPD with acute exacerbation ([place]) 10/28/2018  . Acute on chronic systolic CHF (congestive heart failure) ([place]) 10/28/2018  . Elevated troponin 10/28/2018  . Lactic acidosis 10/28/2018  . GERD (gastroesophageal reflux disease) 10/28/2018  . Acute on chronic respiratory failure with hypercapnia ([place]) 10/28/2018  . On home oxygen therapy 07/12/2018  . CHF (congestive heart failure) ([place]) 05/16/2018  . Chronic respiratory failure with hypoxia and hypercapnia ([place]) 05/03/2018  . Borderline abnormal TFTs 05/02/2018  . Type 2 diabetes mellitus ([place]) 04/26/2018  . Unintended weight loss 04/26/2018  . Lower extremity edema 02/20/2018  . Chronic respiratory failure with hypoxia ([place]) 10/02/2017  . Hematuria 09/27/2017  . Pulmonary hypertension ([name]) c/w cor pulmonale   . Protein-calorie malnutrition, severe 09/17/2017  . Constipation   . Hyperparathyroidism, primary ([place]) 07/27/2016  . Loss of appetite 06/02/2016  . COPD GOLD  II  spirometry, severe Emphysema and 02 dep 11/22/2015  . Vitamin D deficiency 11/22/2015  . Anxiety and depression 11/22/2015  . Insomnia  11/22/2015  . Hyperlipidemia 11/22/2015  . Essential hypertension 11/22/2015  . Hx of colonic polyps 02/10/2013  . History of colon cancer 06/11/1986    Current Outpatient Medications:  .  albuterol (VENTOLIN HFA) 108 (90 Base) MCG/ACT inhaler, INHALE 1-2 PUFFS INTO THE LUNGS EVERY 4 HOURS AS NEEDED FOR WHEEZING OR SHORTNESS OF BREATH., Disp: 6.7 g, Rfl: 5 .  atorvastatin (LIPITOR) 20 MG tablet, Take 1 tablet (20 mg total) by mouth every evening. For cholesterol, Disp: 90 tablet, Rfl: 3 .  bisoprolol (ZEBETA) 5 MG tablet, Take 2.5 mg by mouth daily., Disp: , Rfl:  .  Blood Glucose Calibration (ACCU-CHEK AVIVA) SOLN, USE AS INSTRUCTED TO TEST BLOOD SUGAR DAILY, Disp: 1 each, Rfl: 2 .  Blood Glucose Monitoring Suppl (ACCU-CHEK AVIVA PLUS) w/Device KIT, Use as instructed to test blood sugar up to 3 times daily, Disp: 1 kit, Rfl: 0 .  famotidine (PEPCID) 40 MG tablet, Take 1 tablet twice, Disp: 180 tablet, Rfl: 0 .  fluticasone furoate-vilanterol (BREO ELLIPTA) 100-25 MCG/INH AEPB, Inhale 1 puff into the lungs daily., Disp: , Rfl:  .  furosemide (LASIX) 40 MG tablet, Take 40 mg by mouth daily. , Disp: , Rfl:  .  glipiZIDE (GLUCOTROL) 5 MG tablet, Take 1 tablet (5 mg total) by mouth daily before breakfast., Disp: 90 tablet, Rfl: 1 .  glucose blood (ACCU-CHEK AVIVA PLUS) test strip, Use as instructed to test blood sugar up to 3 times daily, Disp: 300 each, Rfl: 1 .  KLOR-CON M10 10 MEQ tablet, TAKE 2 TABLETS (20 MEQ TOTAL) BY MOUTH DAILY. FOR LOW POTASSIUM., Disp: 180 tablet, Rfl: 1 .  Lancets (ACCU-CHEK SOFT  TOUCH) lancets, Use as instructed to test blood sugar up to 3 times daily, Disp: 300 each, Rfl: 1 .  metoprolol tartrate (LOPRESSOR) 25 MG tablet, TAKE 0.5 TABLETS (12.5 MG TOTAL) BY MOUTH 2 (TWO) TIMES DAILY., Disp: 90 tablet, Rfl: 0 .  mirtazapine (REMERON) 15 MG tablet, TAKE 1 TABLET BY MOUTH AT BEDTIME. FOR DEPRESSION AND APPETITE., Disp: 90 tablet, Rfl: 3 .  OXYGEN, Inhale 4 L into the  lungs continuous. , Disp: , Rfl:  .  polyethylene glycol powder (GLYCOLAX/MIRALAX) 17 GM/SCOOP powder, Take 17 g by mouth daily as needed for mild constipation (TO BE MIXED INTO 4-8 OUNCES OF LIQUID, THEN CONSUMED). , Disp: , Rfl:  .  rivaroxaban (XARELTO) 10 MG TABS tablet, Take 1 tablet (10 mg total) by mouth daily. For stroke prevention., Disp: 90 tablet, Rfl: 3 .  spironolactone (ALDACTONE) 25 MG tablet, Take 1 tablet (25 mg total) by mouth daily. Take at night, Disp: 30 tablet, Rfl: 3 .  umeclidinium bromide (INCRUSE ELLIPTA) 62.5 MCG/INH AEPB, Inhale 1 puff into the lungs daily., Disp:  , Rfl:  No Known Allergies   Social History   Socioeconomic History  . Marital status: Married    Spouse name: Denise [name]  . Number of children: 3  . Years of education: Not on file  . Highest education level: Not on file  Occupational History    Comment: retired Quarry manager  Tobacco Use  . Smoking status: Former Smoker    Packs/day: 1.00    Years: 30.00    Pack years: 30.00    Types: Cigarettes    Quit date: 1989    Years since quitting: 32.2  . Smokeless tobacco: Never Used  Substance and Sexual Activity  . Alcohol use: No  . Drug use: No  . Sexual activity: Not Currently    Partners: Male    Birth control/protection: Post-menopausal, Surgical  Other Topics Concern  . Not on file  Social History Narrative   03/12/19 living with husband, Married. 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 to be near children      Social Determinants of Radio broadcast assistant Strain:   . Difficulty of Paying Living Expenses:   Food Insecurity:   . Worried About Charity fundraiser in the Last Year:   . Arboriculturist in the Last Year:   Transportation Needs:   . Film/video editor (Medical):   [name] Lack of Transportation (Non-Medical):   Physical Activity:   . Days of Exercise per Week:   . Minutes of Exercise per Session:   Stress:   .  Feeling of Stress :   Social Connections:   . Frequency of Communication with Friends and Family:   . Frequency of Social Gatherings with Friends and Family:   . Attends Religious Services:   . Active Member of Clubs or Organizations:   . Attends Archivist Meetings:   [name] Marital Status:   Intimate Partner Violence:   . Fear of Current or Ex-Partner:   . Emotionally Abused:   [name] Physically Abused:   . Sexually Abused:     Physical Exam Vitals reviewed.  Constitutional:      Appearance: She is normal weight.  HENT:     Head: Normocephalic.     Nose: Nose normal.     Mouth/Throat:     Mouth: Mucous membranes are moist.  Pharynx: Oropharynx is clear.  Eyes:     Pupils: Pupils are equal, round, and reactive to light.  Cardiovascular:     Rate and Rhythm: Normal rate and regular rhythm.     Pulses: Normal pulses.     Heart sounds: Normal heart sounds.  Pulmonary:     Effort: Pulmonary effort is normal.     Breath sounds: Normal breath sounds.  Abdominal:     General: Abdomen is flat.     Palpations: Abdomen is soft.  Musculoskeletal:        General: Normal range of motion.     Cervical back: Normal range of motion.     Right lower leg: No edema.     Left lower leg: No edema.  Skin:    General: Skin is warm and dry.     Capillary Refill: Capillary refill takes less than 2 seconds.  Neurological:     Mental Status: She is alert. Mental status is at baseline.  Psychiatric:        Mood and Affect: Mood normal.     Arrived for home visit for [facility] who was alert and oriented with no complaints on assessment. Denise [name] stated she has had no increased shortness of breath, dizziness, cough, chest pain or other priority symptoms. Vitals were obtained. No edema or swelling noted. Lung sounds clear on 3lpm of oxygen. Denise [name] was compliant with all medications this week. Medications reviewed and confirmed, pill box filled accordingly. Denise [name] agreed to visit in one week.  Home visit complete.   Refills: NONE   WEIGHT- 90LBS  CBG- 156    Future Appointments  Date Time Provider [place]  04/07/2019  1:30 PM [facility] PA/NP [facility] None  04/28/2019  7:55 AM [facility] DEVICE REMOTES [facility]  05/06/2019 11:40 AM Larey Dresser, MD [facility] None  05/26/2019 10:00 AM Tanda Rockers, MD [facility] None  06/04/2019  4:00 PM [facility] DEVICE REMOTES [facility]  06/25/2019  9:45 AM Frann Rider, NP [facility] None  07/09/2019  9:40 AM [facility] DEVICE REMOTES [facility]  08/11/2019  7:55 AM [facility] DEVICE REMOTES [facility]  09/15/2019  7:55 AM [facility] DEVICE REMOTES [facility]  10/20/2019  7:55 AM [facility] DEVICE REMOTES [facility]  11/24/2019  7:55 AM [facility] DEVICE REMOTES [facility]  12/29/2019  7:55 AM [facility] DEVICE REMOTES [facility]     ACTION: Home visit completed Next visit planned for ONE WEEK

## 2019-04-03 NOTE — Telephone Encounter (Signed)
Message left for patient's husband to return my call. 

## 2019-04-04 DIAGNOSIS — I110 Hypertensive heart disease with heart failure: Secondary | ICD-10-CM | POA: Diagnosis not present

## 2019-04-04 DIAGNOSIS — I5043 Acute on chronic combined systolic (congestive) and diastolic (congestive) heart failure: Secondary | ICD-10-CM | POA: Diagnosis not present

## 2019-04-04 DIAGNOSIS — I272 Pulmonary hypertension, unspecified: Secondary | ICD-10-CM | POA: Diagnosis not present

## 2019-04-04 DIAGNOSIS — F419 Anxiety disorder, unspecified: Secondary | ICD-10-CM | POA: Diagnosis not present

## 2019-04-04 DIAGNOSIS — I69354 Hemiplegia and hemiparesis following cerebral infarction affecting left non-dominant side: Secondary | ICD-10-CM | POA: Diagnosis not present

## 2019-04-04 DIAGNOSIS — J439 Emphysema, unspecified: Secondary | ICD-10-CM | POA: Diagnosis not present

## 2019-04-04 DIAGNOSIS — J9622 Acute and chronic respiratory failure with hypercapnia: Secondary | ICD-10-CM | POA: Diagnosis not present

## 2019-04-04 DIAGNOSIS — J9611 Chronic respiratory failure with hypoxia: Secondary | ICD-10-CM | POA: Diagnosis not present

## 2019-04-04 DIAGNOSIS — E119 Type 2 diabetes mellitus without complications: Secondary | ICD-10-CM | POA: Diagnosis not present

## 2019-04-05 DIAGNOSIS — E119 Type 2 diabetes mellitus without complications: Secondary | ICD-10-CM | POA: Diagnosis not present

## 2019-04-05 DIAGNOSIS — J439 Emphysema, unspecified: Secondary | ICD-10-CM | POA: Diagnosis not present

## 2019-04-05 DIAGNOSIS — J9622 Acute and chronic respiratory failure with hypercapnia: Secondary | ICD-10-CM | POA: Diagnosis not present

## 2019-04-05 DIAGNOSIS — F419 Anxiety disorder, unspecified: Secondary | ICD-10-CM | POA: Diagnosis not present

## 2019-04-05 DIAGNOSIS — I272 Pulmonary hypertension, unspecified: Secondary | ICD-10-CM | POA: Diagnosis not present

## 2019-04-05 DIAGNOSIS — I69354 Hemiplegia and hemiparesis following cerebral infarction affecting left non-dominant side: Secondary | ICD-10-CM | POA: Diagnosis not present

## 2019-04-05 DIAGNOSIS — I110 Hypertensive heart disease with heart failure: Secondary | ICD-10-CM | POA: Diagnosis not present

## 2019-04-05 DIAGNOSIS — I5043 Acute on chronic combined systolic (congestive) and diastolic (congestive) heart failure: Secondary | ICD-10-CM | POA: Diagnosis not present

## 2019-04-05 DIAGNOSIS — J9611 Chronic respiratory failure with hypoxia: Secondary | ICD-10-CM | POA: Diagnosis not present

## 2019-04-07 ENCOUNTER — Ambulatory Visit (HOSPITAL_COMMUNITY)
Admission: RE | Admit: 2019-04-07 | Discharge: 2019-04-07 | Disposition: A | Discharge status: Home / Self Care | Payer: Medicare HMO | Source: Ambulatory Visit | Attending: Adult Health | Admitting: Adult Health

## 2019-04-07 ENCOUNTER — Other Ambulatory Visit: Payer: Self-pay

## 2019-04-07 ENCOUNTER — Other Ambulatory Visit (HOSPITAL_COMMUNITY): Payer: Self-pay

## 2019-04-07 VITALS — BP 102/60 | HR 70 | Wt 92.2 lb

## 2019-04-07 DIAGNOSIS — Z85038 Personal history of other malignant neoplasm of large intestine: Secondary | ICD-10-CM | POA: Insufficient documentation

## 2019-04-07 DIAGNOSIS — I422 Other hypertrophic cardiomyopathy: Secondary | ICD-10-CM | POA: Diagnosis not present

## 2019-04-07 DIAGNOSIS — J449 Chronic obstructive pulmonary disease, unspecified: Secondary | ICD-10-CM | POA: Insufficient documentation

## 2019-04-07 DIAGNOSIS — Z8041 Family history of malignant neoplasm of ovary: Secondary | ICD-10-CM | POA: Insufficient documentation

## 2019-04-07 DIAGNOSIS — E785 Hyperlipidemia, unspecified: Secondary | ICD-10-CM | POA: Insufficient documentation

## 2019-04-07 DIAGNOSIS — Z8673 Personal history of transient ischemic attack (TIA), and cerebral infarction without residual deficits: Secondary | ICD-10-CM | POA: Diagnosis not present

## 2019-04-07 DIAGNOSIS — E210 Primary hyperparathyroidism: Secondary | ICD-10-CM | POA: Diagnosis not present

## 2019-04-07 DIAGNOSIS — R0602 Shortness of breath: Secondary | ICD-10-CM | POA: Diagnosis present

## 2019-04-07 DIAGNOSIS — R5381 Other malaise: Secondary | ICD-10-CM | POA: Insufficient documentation

## 2019-04-07 DIAGNOSIS — I110 Hypertensive heart disease with heart failure: Secondary | ICD-10-CM | POA: Diagnosis not present

## 2019-04-07 DIAGNOSIS — I509 Heart failure, unspecified: Secondary | ICD-10-CM

## 2019-04-07 DIAGNOSIS — R0609 Other forms of dyspnea: Secondary | ICD-10-CM

## 2019-04-07 DIAGNOSIS — Z79899 Other long term (current) drug therapy: Secondary | ICD-10-CM | POA: Insufficient documentation

## 2019-04-07 DIAGNOSIS — E119 Type 2 diabetes mellitus without complications: Secondary | ICD-10-CM | POA: Diagnosis not present

## 2019-04-07 DIAGNOSIS — Z7951 Long term (current) use of inhaled steroids: Secondary | ICD-10-CM | POA: Diagnosis not present

## 2019-04-07 DIAGNOSIS — Z9981 Dependence on supplemental oxygen: Secondary | ICD-10-CM | POA: Insufficient documentation

## 2019-04-07 DIAGNOSIS — Z7901 Long term (current) use of anticoagulants: Secondary | ICD-10-CM | POA: Insufficient documentation

## 2019-04-07 DIAGNOSIS — J9611 Chronic respiratory failure with hypoxia: Secondary | ICD-10-CM

## 2019-04-07 DIAGNOSIS — I5042 Chronic combined systolic (congestive) and diastolic (congestive) heart failure: Secondary | ICD-10-CM

## 2019-04-07 DIAGNOSIS — R64 Cachexia: Secondary | ICD-10-CM | POA: Insufficient documentation

## 2019-04-07 DIAGNOSIS — R0600 Dyspnea, unspecified: Secondary | ICD-10-CM

## 2019-04-07 DIAGNOSIS — Z87891 Personal history of nicotine dependence: Secondary | ICD-10-CM | POA: Insufficient documentation

## 2019-04-07 DIAGNOSIS — I272 Pulmonary hypertension, unspecified: Secondary | ICD-10-CM | POA: Insufficient documentation

## 2019-04-07 DIAGNOSIS — Z7984 Long term (current) use of oral hypoglycemic drugs: Secondary | ICD-10-CM | POA: Insufficient documentation

## 2019-04-07 DIAGNOSIS — I5022 Chronic systolic (congestive) heart failure: Secondary | ICD-10-CM | POA: Insufficient documentation

## 2019-04-07 LAB — BASIC METABOLIC PANEL
Anion gap: 8 (ref 5–15)
BUN: 22 mg/dL (ref 8–23)
CO2: 28 mmol/L (ref 22–32)
Calcium: 11.1 mg/dL — ABNORMAL HIGH (ref 8.9–10.3)
Chloride: 107 mmol/L (ref 98–111)
Creatinine, Ser: 1.21 mg/dL — ABNORMAL HIGH (ref 0.44–1.00)
GFR calc Af Amer: 51 mL/min — ABNORMAL LOW (ref >60)
GFR calc non Af Amer: 44 mL/min — ABNORMAL LOW (ref >60)
Glucose, Bld: 181 mg/dL — ABNORMAL HIGH (ref 70–99)
Potassium: 4.5 mmol/L (ref 3.5–5.1)
Sodium: 143 mmol/L (ref 135–145)

## 2019-04-07 NOTE — Progress Notes (Signed)
I agree with the above plan 

## 2019-04-07 NOTE — Progress Notes (Signed)
I accompanied Denise [name] today in the clinic. Kasheena was seated in her wheel chair alert and oriented. [place] had no complaints today during visit. Amy Clegg PA saw patient today and discussed end of life goals and plans. Johnita has been seen by [facility] back in February. Amy suggested a follow up with them every three months. I will contact same and follow up. I will also reach out to [facility] to see the status of their visits for the patient. Amy discontinued Metoprolol today for patient and requested a 6 week follow up. I will see patient in two weeks at home.   REFILLS: Xarelto Lasix

## 2019-04-07 NOTE — Patient Instructions (Signed)
STOP Metoprolol  Labs today We will only contact you if something comes back abnormal or we need to make some changes. Otherwise no news is good news!   Your physician recommends that you schedule a follow-up appointment in: 6 weeks with  in the Advanced Practitioners (PA/NP) Clinic   Your physician recommends that you schedule a follow-up appointment in: 12 weeks with Dr Aundra Dubin  Do the following things EVERYDAY: 1) Weigh yourself in the morning before breakfast. Write it down and keep it in a log. 2) Take your medicines as prescribed 3) Eat low salt foods--Limit salt (sodium) to 2000 mg per day.  4) Stay as active as you can everyday 5) Limit all fluids for the day to less than 2 liters  At the [facility], you and your health needs are our priority. As part of our continuing mission to provide you with exceptional heart care, we have created designated Provider Care Teams. These Care Teams include your primary Cardiologist (physician) and Advanced Practice Providers (APPs- Physician Assistants and Nurse Practitioners) who all work together to provide you with the care you need, when you need it.   You may see any of the following providers on your designated Care Team at your next follow up: [name] Dr Glori Bickers . Dr Loralie Champagne . Darrick Grinder, NP . Lyda Jester, PA . Audry Riles, PharmD   Please be sure to bring in all your medications bottles to every appointment.

## 2019-04-07 NOTE — Progress Notes (Signed)
Date:  04/07/2019   ID:  [name], DOB 12/07/1945, MRN [id number]    Provider location: [facility] Type of Visit: Established patient   PCP:  [name], NP  Cardiologist: Dr. Aundra Dubin  Chief Complaint: Shortness of breath   History of Present Illness: JOE TANNEY is a 74 y.o. female who has COPD on home oxygen, pulmonary HTN, suspected hypertrophic cardiomyopathy, HLD, DM2, Colon cancer s/p resection, hyperparathyroidism pending surgery, and HTN.   Admitted 9/15 - 09/24/17 for acute on chronic hypoxic respiratory failure. RHC at that time by Dr. Aundra Dubin showed significant pulmonary HTN. Thought to be most consistent with WHO group 3/COPD. Hospital course complicated by abdominal pain. GI work up with no clear cause.  Seen by Dr. Melvyn Novas 11/01/17. PFTs that day with significant COPD.   She was hospitalized in 3/20 with PNA/COPD exacerbation and CHF exacerbation.  She was treated with antibiotics and diuresed.  RHC/LHC showed no coronary disease, normal filling pressures, moderate pulmonary hypertension.  V/Q scan showed no chronic PE.  High resolution CT showed moderate COPD, no interstitial lung disease.  Echo was concerning for hypertrophic CMP, so cardiac MRI was done.  LV EF 38%, moderate focal basal septal hypertrophy, mid-wall LGE in a noncoronary pattern suggestive of HCM.   In November 2020 she was admitted with an acute ischemic stroke, status post TPA with hemorrhagic conversion treated with cryoprecipitate and tranexamic acid, went to acute inpatient rehab and discharged home on 11/19/2018 presented back to the ED on 11/27/2018 with worsening weakness on the left side. MRI showed new right thalamocapsular stroke with old hemorrhagic strokes. CTA with no acute occlusion, severe stenosis of left vertebral artery origin. Discharged on 12/06/19.   Admitted 12/30/19 with A/C respiratory failure. Treated with steroids and discharged to  home the next day.   Had visit from [facility] 02/2019 with plans to continue aggressive care.   She was seen by Marcellina Millin on 03/24/19 and with recommendations to return to the HF clinic.   Today she returns for HF follow up.Overall feeling ok. SOB with exertion. On 4 liters [place]. Denies PND/Orthopnea. Not moving around much at home. Spends most her time in a chair. Appetite fair. No fever or chills. Weight at home 88-90 pounds. Requires assistance with ADLs.Taking all medications. Followed by [facility]. Followed by HF Paramedicine. Has hospital bed.   Labs (3/20): RF elevated but anti-CCP negative, SPEP negative, anti-centromere Ab negative, anti-SCL 70 negative, ANA negative anti-Jo-1 negative, HIV negative.  Labs (4/20): K 3.5, creatinine 1.02, hgb 13, BNP 910, TSH normal Labs (5/20): K 3.1, creatinine 1.0 Labs (6/20): K 4.9, pro-BNP 840, K 4.9, creatinine 1.22  PMH: 1. Colon cancer: Surgery 1988.  2. COPD: Severe, on home oxygen.  - CT chest (3/20): Moderate emphysema.  3. Hyperlipidemia 4. HTN 5. Primary hyperparathyroidism 6. Pulmonary hypertension: Suspect there is a component of group 3 PH due to lung disease, but think also out of proportion so may be group 1 component.  - RHC (9/19): mean RA 3, PA 57/16 mean 32, mean PCWP 5, CI 1.61, PVR 10 WU.  - High resolution CT chest (3/20) with moderate emphysema, no ILD.  - Echo (3/20) with mild RV dilation/moderately decreased RV systolic function.  - LHC/RHC (3/20): Luminal irregularities in coronaries; mean RA 7, PA 59/22 mean 36, mean PCWP 9, CI 2, PVR 8.6 WU.  - HIV, anti-SCL-70, anti-centromere, CCP, ANA, anti-Jo-1 negative.  -  V/Q scan (9/19): No evidence for chronic PE.  7. Suspected hypertrophic cardiomyopathy:  - Echo (3/20) with EF 45%, moderate asymmetric septal hypertrophy, mildly dilated RV with moderately decreased systolic function.  No mitral valve SAM.  - PYP scan (3/20): Not suggestive of  transthyretin amyloidosis.  - Cardiac MRI (3/20): LV EF 38%, moderate focal basal septal hypertrophy, RV EF 30%, LGE pattern suggestive of HCM.    Current Outpatient Medications  Medication Sig Dispense Refill  . albuterol (VENTOLIN HFA) 108 (90 Base) MCG/ACT inhaler INHALE 1-2 PUFFS INTO THE LUNGS EVERY 4 HOURS AS NEEDED FOR WHEEZING OR SHORTNESS OF BREATH. 6.7 g 5  . atorvastatin (LIPITOR) 20 MG tablet Take 1 tablet (20 mg total) by mouth every evening. For cholesterol 90 tablet 3  . bisoprolol (ZEBETA) 5 MG tablet Take 2.5 mg by mouth daily.    . Blood Glucose Calibration (ACCU-CHEK AVIVA) SOLN USE AS INSTRUCTED TO TEST BLOOD SUGAR DAILY 1 each 2  . Blood Glucose Monitoring Suppl (ACCU-CHEK AVIVA PLUS) w/Device KIT Use as instructed to test blood sugar up to 3 times daily 1 kit 0  . famotidine (PEPCID) 40 MG tablet Take 1 tablet twice 180 tablet 0  . fluticasone furoate-vilanterol (BREO ELLIPTA) 100-25 MCG/INH AEPB Inhale 1 puff into the lungs daily.    . furosemide (LASIX) 40 MG tablet Take 40 mg by mouth daily.     [name] glipiZIDE (GLUCOTROL) 5 MG tablet Take 1 tablet (5 mg total) by mouth daily before breakfast. 90 tablet 1  . glucose blood (ACCU-CHEK AVIVA PLUS) test strip Use as instructed to test blood sugar up to 3 times daily 300 each 1  . KLOR-CON M10 10 MEQ tablet TAKE 2 TABLETS (20 MEQ TOTAL) BY MOUTH DAILY. FOR LOW POTASSIUM. 180 tablet 1  . Lancets (ACCU-CHEK SOFT TOUCH) lancets Use as instructed to test blood sugar up to 3 times daily 300 each 1  . metoprolol tartrate (LOPRESSOR) 25 MG tablet TAKE 0.5 TABLETS (12.5 MG TOTAL) BY MOUTH 2 (TWO) TIMES DAILY. 90 tablet 0  . mirtazapine (REMERON) 15 MG tablet TAKE 1 TABLET BY MOUTH AT BEDTIME. FOR DEPRESSION AND APPETITE. 90 tablet 3  . OXYGEN Inhale 4 L into the lungs continuous.     . polyethylene glycol powder (GLYCOLAX/MIRALAX) 17 GM/SCOOP powder Take 17 g by mouth daily as needed for mild constipation (TO BE MIXED INTO 4-8 OUNCES OF  LIQUID, THEN CONSUMED).     . rivaroxaban (XARELTO) 10 MG TABS tablet Take 1 tablet (10 mg total) by mouth daily. For stroke prevention. 90 tablet 3  . spironolactone (ALDACTONE) 25 MG tablet Take 1 tablet (25 mg total) by mouth daily. Take at night 30 tablet 3  . umeclidinium bromide (INCRUSE ELLIPTA) 62.5 MCG/INH AEPB Inhale 1 puff into the lungs daily.     No current facility-administered medications for this encounter.    Allergies:   Patient has no known allergies.   Social History:  The patient  reports that she quit smoking about 32 years ago. Her smoking use included cigarettes. She has a 30.00 pack-year smoking history. She has never used smokeless tobacco. She reports that she does not drink alcohol or use drugs.   Family History:  The patient's family history includes Ovarian cancer in her mother.   ROS:  Please see the history of present illness.   All other systems are personally reviewed and negative.   Exam:   Wt Readings from Last 3 Encounters:  04/07/19 41.8 kg (  92 lb 4 oz)  04/02/19 40.8 kg (90 lb)  03/26/19 39.5 kg (87 lb)    BP 102/60   Pulse 70   Wt 41.8 kg (92 lb 4 oz)   SpO2 98% Comment: 4 L  BMI 15.35 kg/m  General:  Thin. Arrived in a wheel chair. No resp difficulty HEENT: normal, cachetic Neck: supple. no JVD. Carotids 2+ bilat; no bruits. No lymphadenopathy or thryomegaly appreciated. Cor: PMI nondisplaced. Regular rate & rhythm. No rubs, gallops or murmurs. Lungs: clear on 4 liters [place].  Abdomen: soft, nontender, nondistended. No hepatosplenomegaly. No bruits or masses. Good bowel sounds. Extremities: no cyanosis, clubbing, rash, edema Neuro: alert & orientedx3, cranial nerves grossly intact. LUE LLE weak.  Affect pleasant  Recent Labs: 05/02/2018: TSH 0.65 06/24/2018: Pro B Natriuretic peptide (BNP) 840.0 12/30/2018: Magnesium 2.1 12/31/2018: B Natriuretic Peptide 785.1 02/25/2019: ALT 16; BUN 18; Creatinine, Ser 1.29; Hemoglobin 12.9; Platelets  186; Potassium 5.0; Sodium 136  Personally reviewed   Wt Readings from Last 3 Encounters:  04/07/19 41.8 kg (92 lb 4 oz)  04/02/19 40.8 kg (90 lb)  03/26/19 39.5 kg (87 lb)      ASSESSMENT AND PLAN:  1. COPD: She continues on home oxygen, follows with Dr Melvyn Novas.  - Continue regular followup with pulmonary.  2.Chronic systolic CHF: With prominent RV failure. Echo 3/20 with EF 45%, asymmetric septal hypertrophy, moderate RV systolic dysfunction. RHC/LHC was done in 3/20, showing no significant CAD and moderate pulmonary arterial hypertension.  Cardiac MRI showed LV EF 38%, moderate basal septal hypertrophy, RV EF 30%, mid-wall LGE in the basal anteroseptal wall and lesser extent in mid lateral wall.  Most concerning for hypertrophic CMP, less likely cardiac amyloidosis.  PYP scan was negative for evidence of transthyretin amyloidosis and SPEP negative.  She does not have a known family history of HCM.   NYHA  III . Volume status stable.  - Continue  Lasix 40 mg daily  - Continue bisoprolol 5 mg daily.  Stop metoprolol.  - Continue spironolactone 25 mg daily.   - Check BMET  3. Hypertrophic cardiomyopathy: Imaging has been concerning for HCM without LVOT obstruction or SAM.  No family history of HCM or sudden death.   4. Pulmonary hypertension: RHC showed moderate pulmonary arterial hypertension with high PVR (8.6 WU).  V/Q scan (9/19) did not show chronic PE. CT chest high resolution showed no ILD, there was moderate emphysema.  Pulmonary hypertension seems to be out of proportion to degree of lung disease.  I suspected mixed group 1 (idiopathic PAH) and group 3 PH.  Serologies sent and came back negative other than RF elevated (CCP negative). HIV negative. She has been on amlodipine in the past without improvement. Tried Tyvaso (to limit V/Q mismatching in the setting of lung parenchymal disease), but she felt worse on this med and wants to stop it.  - Stop Tyvaso. Suspect that her pulmonary  hypertension is primarily group 3 from COPD. Would hold off on further pulmonary vasodilators.  5. CNO:BSJGGE.   Discussed goals of care and for now she wants to continue current regimen.  She has been seen by [facility] back in February but I think she needs Hospice. She is very debilitated, cachetic, and has several co-morbidities. I would like for [facility] to come back suspect she will continue to decline and would like to transition to Hospice.   She and her husband were appreciative and are open to another Palliative Care visit.  Follow up in 6 week with APP and 3 months with Dr Aundra Dubin.    Jeanmarie Hubert, NP  04/07/2019  [facility] [address] [facility] and [place] [address] [phone] (office) [phone] (fax)

## 2019-04-09 DIAGNOSIS — I272 Pulmonary hypertension, unspecified: Secondary | ICD-10-CM | POA: Diagnosis not present

## 2019-04-09 DIAGNOSIS — I69354 Hemiplegia and hemiparesis following cerebral infarction affecting left non-dominant side: Secondary | ICD-10-CM | POA: Diagnosis not present

## 2019-04-09 DIAGNOSIS — I5043 Acute on chronic combined systolic (congestive) and diastolic (congestive) heart failure: Secondary | ICD-10-CM | POA: Diagnosis not present

## 2019-04-09 DIAGNOSIS — J439 Emphysema, unspecified: Secondary | ICD-10-CM | POA: Diagnosis not present

## 2019-04-09 DIAGNOSIS — I110 Hypertensive heart disease with heart failure: Secondary | ICD-10-CM | POA: Diagnosis not present

## 2019-04-09 DIAGNOSIS — E119 Type 2 diabetes mellitus without complications: Secondary | ICD-10-CM | POA: Diagnosis not present

## 2019-04-09 DIAGNOSIS — F419 Anxiety disorder, unspecified: Secondary | ICD-10-CM | POA: Diagnosis not present

## 2019-04-09 DIAGNOSIS — J9622 Acute and chronic respiratory failure with hypercapnia: Secondary | ICD-10-CM | POA: Diagnosis not present

## 2019-04-09 DIAGNOSIS — J9611 Chronic respiratory failure with hypoxia: Secondary | ICD-10-CM | POA: Diagnosis not present

## 2019-04-11 ENCOUNTER — Telehealth: Payer: Self-pay

## 2019-04-11 DIAGNOSIS — E119 Type 2 diabetes mellitus without complications: Secondary | ICD-10-CM | POA: Diagnosis not present

## 2019-04-11 DIAGNOSIS — I5043 Acute on chronic combined systolic (congestive) and diastolic (congestive) heart failure: Secondary | ICD-10-CM | POA: Diagnosis not present

## 2019-04-11 DIAGNOSIS — J439 Emphysema, unspecified: Secondary | ICD-10-CM | POA: Diagnosis not present

## 2019-04-11 DIAGNOSIS — I69354 Hemiplegia and hemiparesis following cerebral infarction affecting left non-dominant side: Secondary | ICD-10-CM | POA: Diagnosis not present

## 2019-04-11 DIAGNOSIS — J9622 Acute and chronic respiratory failure with hypercapnia: Secondary | ICD-10-CM | POA: Diagnosis not present

## 2019-04-11 DIAGNOSIS — I272 Pulmonary hypertension, unspecified: Secondary | ICD-10-CM | POA: Diagnosis not present

## 2019-04-11 DIAGNOSIS — I110 Hypertensive heart disease with heart failure: Secondary | ICD-10-CM | POA: Diagnosis not present

## 2019-04-11 DIAGNOSIS — J449 Chronic obstructive pulmonary disease, unspecified: Secondary | ICD-10-CM | POA: Diagnosis not present

## 2019-04-11 DIAGNOSIS — F419 Anxiety disorder, unspecified: Secondary | ICD-10-CM | POA: Diagnosis not present

## 2019-04-11 DIAGNOSIS — I5022 Chronic systolic (congestive) heart failure: Secondary | ICD-10-CM | POA: Diagnosis not present

## 2019-04-11 DIAGNOSIS — J9611 Chronic respiratory failure with hypoxia: Secondary | ICD-10-CM | POA: Diagnosis not present

## 2019-04-11 NOTE — Telephone Encounter (Signed)
Noted.

## 2019-04-11 NOTE — Telephone Encounter (Signed)
Margaretha Sheffield PT with [facility] said when she got to pts home this morning pt was resting and pulse ox was 88%. After 30 seconds of standing and 15 seconds of walking pt became very tired and sat down. Pulse ox was 76% R 28  BP 106/68 P 80; pt is on continuous 4 L oxygen. Pt did not recover well from this episode. Margaretha Sheffield increased oxygen to 4 1/2 L and after 15 ' pulse ox is 94%. Pt is not in any distress; this is not usual response to activity. This episode lasted approx 45'. Margaretha Sheffield is reducing O2 back to 4L. Margaretha Sheffield advised pt if occurred again to either call us or go to ED. I spoke with Gentry Fitz NP and she said if pt is not having any distress now can rest and if further issues contact pts pulmonologist; I gave Margaretha Sheffield and pts husband's Dr [facility] name and contact # [phone]. Margaretha Sheffield and Mr Methot voiced understanding and ED precautions given and voiced understanding.

## 2019-04-14 DIAGNOSIS — I272 Pulmonary hypertension, unspecified: Secondary | ICD-10-CM | POA: Diagnosis not present

## 2019-04-14 DIAGNOSIS — I69354 Hemiplegia and hemiparesis following cerebral infarction affecting left non-dominant side: Secondary | ICD-10-CM | POA: Diagnosis not present

## 2019-04-14 DIAGNOSIS — J9622 Acute and chronic respiratory failure with hypercapnia: Secondary | ICD-10-CM | POA: Diagnosis not present

## 2019-04-14 DIAGNOSIS — J9611 Chronic respiratory failure with hypoxia: Secondary | ICD-10-CM | POA: Diagnosis not present

## 2019-04-14 DIAGNOSIS — F419 Anxiety disorder, unspecified: Secondary | ICD-10-CM | POA: Diagnosis not present

## 2019-04-14 DIAGNOSIS — E119 Type 2 diabetes mellitus without complications: Secondary | ICD-10-CM | POA: Diagnosis not present

## 2019-04-14 DIAGNOSIS — J439 Emphysema, unspecified: Secondary | ICD-10-CM | POA: Diagnosis not present

## 2019-04-14 DIAGNOSIS — I5043 Acute on chronic combined systolic (congestive) and diastolic (congestive) heart failure: Secondary | ICD-10-CM | POA: Diagnosis not present

## 2019-04-14 DIAGNOSIS — I110 Hypertensive heart disease with heart failure: Secondary | ICD-10-CM | POA: Diagnosis not present

## 2019-04-15 ENCOUNTER — Telehealth: Payer: Self-pay | Admitting: Primary Care

## 2019-04-15 NOTE — Progress Notes (Signed)
  Chronic Care Management   Outreach Note  04/15/2019 Name: Denise [name] MRN: [id number] DOB: 08/14/1945  Referred by: [name], NP Reason for referral : No chief complaint on file.   An unsuccessful telephone outreach was attempted today. The patient was referred to the pharmacist for assistance with care management and care coordination.   Follow Up Plan:   Raynicia Dukes [employer] Scheduler

## 2019-04-16 ENCOUNTER — Telehealth: Payer: Self-pay | Admitting: *Deleted

## 2019-04-16 ENCOUNTER — Other Ambulatory Visit (HOSPITAL_COMMUNITY): Payer: Self-pay

## 2019-04-16 ENCOUNTER — Telehealth: Payer: Self-pay

## 2019-04-16 DIAGNOSIS — J9622 Acute and chronic respiratory failure with hypercapnia: Secondary | ICD-10-CM | POA: Diagnosis not present

## 2019-04-16 DIAGNOSIS — F419 Anxiety disorder, unspecified: Secondary | ICD-10-CM | POA: Diagnosis not present

## 2019-04-16 DIAGNOSIS — E119 Type 2 diabetes mellitus without complications: Secondary | ICD-10-CM | POA: Diagnosis not present

## 2019-04-16 DIAGNOSIS — J439 Emphysema, unspecified: Secondary | ICD-10-CM | POA: Diagnosis not present

## 2019-04-16 DIAGNOSIS — I69354 Hemiplegia and hemiparesis following cerebral infarction affecting left non-dominant side: Secondary | ICD-10-CM | POA: Diagnosis not present

## 2019-04-16 DIAGNOSIS — I5043 Acute on chronic combined systolic (congestive) and diastolic (congestive) heart failure: Secondary | ICD-10-CM | POA: Diagnosis not present

## 2019-04-16 DIAGNOSIS — I110 Hypertensive heart disease with heart failure: Secondary | ICD-10-CM | POA: Diagnosis not present

## 2019-04-16 DIAGNOSIS — I272 Pulmonary hypertension, unspecified: Secondary | ICD-10-CM | POA: Diagnosis not present

## 2019-04-16 DIAGNOSIS — J9611 Chronic respiratory failure with hypoxia: Secondary | ICD-10-CM | POA: Diagnosis not present

## 2019-04-16 NOTE — Telephone Encounter (Signed)
She's always been a bit of an enigma and agree she doesn't really suffer symptomatically from low sats so would emphasize medical science has done all it can, consider hospice where we treat the patient and not the numbers if max settings 02 delivery settings don't achieve adequate sats.

## 2019-04-16 NOTE — Telephone Encounter (Signed)
Adjust flow to keep sats around 90% or greater

## 2019-04-16 NOTE — Telephone Encounter (Signed)
Spoke with Denise [name], the patient has been having trouble for the last weak with her oxygen decreasing. At rest she is at 83% and they had to change her tubing to a short tube and 4L pushed her up to 88%-89%. She had a stroke in December and Denise [name] is questioning how much oxygen she is supposed to be on. From our records it looks like 2L continuous but she has been using 4L. She wanted to get recommendations from Dr. Melvyn Novas and wants to know if pt should be seen in our office to evaluate. MW please advise.   Denise [name] [phone]   Assessment & Plan Note by Tanda Rockers, MD at 11/26/2018 6:50 AM Author: Tanda Rockers, MD Author Type: Physician Filed: 11/26/2018 6:51 AM  Note Status: [place] List: Cosign Not Required Encounter Date: 11/25/2018  Problem: COPD GOLD II spirometry, severe Emphysema and 02 dep  Editor: Tanda Rockers, MD (Physician)  Prior Versions: 1. Tanda Rockers, MD (Physician) at 11/26/2018 6:51 AM - Written    Quit smoking 1989 - Spirometry 09/11/2016  FEV1 1.03 (52%)  Ratio 54 mild curvature, on no rx - 09/11/2016  After extensive coaching HFA effectiveness =    50% from baseline 10% > continue  symb 160 2bid but consider change to bevespi or stiolto  - 10/02/2017  After extensive coaching inhaler device,  effectiveness =    50% from a baseline near 0 > rechallenge with symbicort 160 x 2 bid x 2 weeks then re-group  - 10/05/2017    try stiolto x 2pffs x 2 week sample  - PFT's  11/01/2017  FEV1 0.95 (52 % ) ratio fev1/vc = 69%  p 3 % improvement from saba p ? Stiolto(not sure she used it)  prior to study with DLCO  30/30c % corrects to 59 % for alv volume  With air trapping pattern  confirmed on cxr as well - 05/02/2018   Try breo  - HRCT 03/19/18 c/w mod emphysema, no ILD , some dep scarring/ atx -  11/25/2018  After extensive coaching inhaler device,  effectiveness =    75% wit dpi (short breath hold, short Ti) > change to trelegy     Group D in terms of symptom/risk and  laba/lama/ICS  therefore appropriate rx at this point >>>  Breo/incruse = trelegy and should be about the same price as the two separate devices.  Reviewed: Advised:  formulary restrictions will be an ongoing challenge for the forseable future and I would be happy to pick an alternative if the pt will first  provide me a list of them -  pt  will need to return here for training for any new device that is required eg dpi vs hfa vs respimat.    In the meantime we can always provide samples so that the patient never runs out of any needed respiratory medications.

## 2019-04-16 NOTE — Telephone Encounter (Signed)
Noted, Dr. Melvyn Novas has responded via phone note.  Dr. Melvyn Novas, I get numerous calls weekly regarding this patient and her resting oxygen saturation level of low to mid 80% on supplemental oxygen. She is not distressed most of the time.   I see that her goal resting saturation is 90%. Any other advise you can provide me for when I get frantic calls by home health or interrupted by my own nursing staff? I want to do what I can to keep her out of the ED.

## 2019-04-16 NOTE — Telephone Encounter (Signed)
Called pt and advised message from the provider. Pt understood and verbalized understanding. Nothing further is needed.

## 2019-04-16 NOTE — Progress Notes (Signed)
Paramedicine Encounter    Patient ID: Denise [name], female    DOB: 1945/12/02, 74 y.o.   MRN: [id number]   Patient Care Team: [name], NP as PCP - General (Internal Medicine) [name], MD as Consulting Physician (Cardiology)  Patient Active Problem List   Diagnosis Date Noted  . UTI (urinary tract infection) 03/05/2019  . Acute on chronic respiratory failure with hypoxia ([place]) 12/30/2018  . Acute on chronic diastolic CHF (congestive heart failure) ([place]) 12/30/2018  . Acute ischemic right MCA stroke ([place]) 11/27/2018  . Vaginal candidiasis   . Acute blood loss anemia   . Supplemental oxygen dependent   . ICH (intracerebral hemorrhage) ([place]) s/p tPA administration 11/08/2018  . Hypertensive urgency 11/08/2018  . Cerebellar stroke, acute ([place]) 11/08/2018  . History of ischemic stroke 11/04/2018  . COPD with acute exacerbation ([place]) 10/28/2018  . Acute on chronic systolic CHF (congestive heart failure) ([place]) 10/28/2018  . Elevated troponin 10/28/2018  . Lactic acidosis 10/28/2018  . GERD (gastroesophageal reflux disease) 10/28/2018  . Acute on chronic respiratory failure with hypercapnia ([place]) 10/28/2018  . On home oxygen therapy 07/12/2018  . CHF (congestive heart failure) ([place]) 05/16/2018  . Chronic respiratory failure with hypoxia and hypercapnia ([place]) 05/03/2018  . Borderline abnormal TFTs 05/02/2018  . Type 2 diabetes mellitus ([place]) 04/26/2018  . Unintended weight loss 04/26/2018  . Lower extremity edema 02/20/2018  . Chronic respiratory failure with hypoxia ([place]) 10/02/2017  . Hematuria 09/27/2017  . Pulmonary hypertension ([name]) c/w cor pulmonale   . Protein-calorie malnutrition, severe 09/17/2017  . Constipation   . Hyperparathyroidism, primary ([place]) 07/27/2016  . Loss of appetite 06/02/2016  . COPD GOLD  II  spirometry, severe Emphysema and 02 dep 11/22/2015  . Vitamin D deficiency 11/22/2015  . Anxiety and depression 11/22/2015  . Insomnia  11/22/2015  . Hyperlipidemia 11/22/2015  . Essential hypertension 11/22/2015  . Hx of colonic polyps 02/10/2013  . History of colon cancer 06/11/1986    Current Outpatient Medications:  .  albuterol (VENTOLIN HFA) 108 (90 Base) MCG/ACT inhaler, INHALE 1-2 PUFFS INTO THE LUNGS EVERY 4 HOURS AS NEEDED FOR WHEEZING OR SHORTNESS OF BREATH., Disp: 6.7 g, Rfl: 5 .  atorvastatin (LIPITOR) 20 MG tablet, Take 1 tablet (20 mg total) by mouth every evening. For cholesterol, Disp: 90 tablet, Rfl: 3 .  bisoprolol (ZEBETA) 5 MG tablet, Take 2.5 mg by mouth daily., Disp: , Rfl:  .  Blood Glucose Calibration (ACCU-CHEK AVIVA) SOLN, USE AS INSTRUCTED TO TEST BLOOD SUGAR DAILY, Disp: 1 each, Rfl: 2 .  Blood Glucose Monitoring Suppl (ACCU-CHEK AVIVA PLUS) w/Device KIT, Use as instructed to test blood sugar up to 3 times daily, Disp: 1 kit, Rfl: 0 .  famotidine (PEPCID) 40 MG tablet, Take 1 tablet twice, Disp: 180 tablet, Rfl: 0 .  fluticasone furoate-vilanterol (BREO ELLIPTA) 100-25 MCG/INH AEPB, Inhale 1 puff into the lungs daily., Disp: , Rfl:  .  furosemide (LASIX) 40 MG tablet, Take 40 mg by mouth daily. , Disp: , Rfl:  .  glipiZIDE (GLUCOTROL) 5 MG tablet, Take 1 tablet (5 mg total) by mouth daily before breakfast., Disp: 90 tablet, Rfl: 1 .  glucose blood (ACCU-CHEK AVIVA PLUS) test strip, Use as instructed to test blood sugar up to 3 times daily, Disp: 300 each, Rfl: 1 .  KLOR-CON M10 10 MEQ tablet, TAKE 2 TABLETS (20 MEQ TOTAL) BY MOUTH DAILY. FOR LOW POTASSIUM., Disp: 180 tablet, Rfl: 1 .  Lancets (ACCU-CHEK SOFT  TOUCH) lancets, Use as instructed to test blood sugar up to 3 times daily, Disp: 300 each, Rfl: 1 .  mirtazapine (REMERON) 15 MG tablet, TAKE 1 TABLET BY MOUTH AT BEDTIME. FOR DEPRESSION AND APPETITE., Disp: 90 tablet, Rfl: 3 .  OXYGEN, Inhale 4 L into the lungs continuous. , Disp: , Rfl:  .  polyethylene glycol powder (GLYCOLAX/MIRALAX) 17 GM/SCOOP powder, Take 17 g by mouth daily as needed for  mild constipation (TO BE MIXED INTO 4-8 OUNCES OF LIQUID, THEN CONSUMED). , Disp: , Rfl:  .  rivaroxaban (XARELTO) 10 MG TABS tablet, Take 1 tablet (10 mg total) by mouth daily. For stroke prevention., Disp: 90 tablet, Rfl: 3 .  spironolactone (ALDACTONE) 25 MG tablet, Take 1 tablet (25 mg total) by mouth daily. Take at night, Disp: 30 tablet, Rfl: 3 .  umeclidinium bromide (INCRUSE ELLIPTA) 62.5 MCG/INH AEPB, Inhale 1 puff into the lungs daily., Disp:  , Rfl:  No Known Allergies   Social History   Socioeconomic History  . Marital status: Married    Spouse name: Chalmers Cater  . Number of children: 3  . Years of education: Not on file  . Highest education level: Not on file  Occupational History    Comment: retired Quarry manager  Tobacco Use  . Smoking status: Former Smoker    Packs/day: 1.00    Years: 30.00    Pack years: 30.00    Types: Cigarettes    Quit date: 1989    Years since quitting: 32.3  . Smokeless tobacco: Never Used  Substance and Sexual Activity  . Alcohol use: No  . Drug use: No  . Sexual activity: Not Currently    Partners: Male    Birth control/protection: Post-menopausal, Surgical  Other Topics Concern  . Not on file  Social History Narrative   03/12/19 living with husband, Married. 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 Difficulty of Paying Living Expenses:   Food Insecurity:   . Worried About Charity fundraiser in the Last Year:   . Arboriculturist in the Last Year:   Transportation Needs:   . Film/video editor (Medical):   [name] Lack of Transportation (Non-Medical):   Physical Activity:   . Days of Exercise per Week:   . Minutes of Exercise per Session:   Stress:   . Feeling of Stress :   Social Connections:   . Frequency of Communication with Friends and Family:   . Frequency of Social Gatherings with  Friends and Family:   . Attends Religious Services:   . Active Member of Clubs or Organizations:   . Attends Archivist Meetings:   [name] Marital Status:   Intimate Partner Violence:   . Fear of Current or Ex-Partner:   . Emotionally Abused:   [name] Physically Abused:   . Sexually Abused:     Physical Exam Vitals and nursing note reviewed.  Constitutional:      Appearance: She is normal weight.  HENT:     Head: Normocephalic.     Nose: Nose normal.     Mouth/Throat:     Mouth: Mucous membranes are moist.     Pharynx: Oropharynx is clear.  Eyes:     Pupils: Pupils are equal, round, and reactive to light.  Cardiovascular:     Rate and Rhythm: Normal rate and regular rhythm.     Pulses: Normal pulses.  Pulmonary:     Effort: Pulmonary effort is normal.     Breath sounds: Normal breath sounds.  Abdominal:     General: Abdomen is flat.     Palpations: Abdomen is soft.  Musculoskeletal:        General: Normal range of motion.     Cervical back: Normal range of motion.     Right lower leg: No edema.     Left lower leg: No edema.  Skin:    General: Skin is warm and dry.     Capillary Refill: Capillary refill takes less than 2 seconds.  Neurological:     Mental Status: She is alert. Mental status is at baseline.  Psychiatric:        Mood and Affect: Mood normal.     Arrived for home visit for [facility] who was seated in her wheel chair alert and oriented on her 4lpm of oxygen with her husband at her side. Husband reports her oxygen dropping to 76% on her 4lpm earlier today and a few times over the last week. Denise [name] was short of breath but gaining control of her breathing with some coaching. Lung sounds were clear and oxygen was noting to be improving with rest and shorter nasal cannula tubing. Patient's husband reports that her home health RN came out today and reported same to her pulmonologist and they suggested to titrate her oxygen to 90% with increased supplemental via  nasal cannula. I showed her husband how to do so with her oxygen concentrator. He understood and Enid Derry improved with no distress by the end of our visit. Medications were reviewed and confirmed. Pill box filled accordingly. Home visit complete. [facility] have [facility] awaiting phone calls to speak to her care teams. I will see patient in one week.   Weight- 88.4lbs [id number]  Refill: Xarelto  Future Appointments  Date Time Provider [place]  04/28/2019  7:55 AM [facility] DEVICE REMOTES [facility]  05/20/2019  2:00 PM [facility] PA/NP [facility] None  05/26/2019 10:00 AM Tanda Rockers, MD [facility] None  06/04/2019  4:00 PM [facility] DEVICE REMOTES [facility]  06/25/2019  9:45 AM Frann Rider, NP [facility] None  07/08/2019  1:40 PM Larey Dresser, MD [facility] None  07/09/2019  9:40 AM [facility] DEVICE REMOTES [facility]  08/11/2019  7:55 AM [facility] DEVICE REMOTES [facility]  09/15/2019  7:55 AM [facility] DEVICE REMOTES [facility]  10/20/2019  7:55 AM [facility] DEVICE REMOTES [facility]  11/24/2019  7:55 AM [facility] DEVICE REMOTES [facility]  12/29/2019  7:55 AM [facility] DEVICE REMOTES [facility]     ACTION: Home visit completed Next visit planned for one week

## 2019-04-16 NOTE — Telephone Encounter (Signed)
[place] with [facility] called stating that she is with patient now. Anda Kraft stated when she arrived to see the patient she was at rest and oxygen level was 83-84% on 4 liters of oxygen. Anda Kraft stated that the oxygen tank was attached to a long tube, so she moved the tank closer and attached A shorter tubing. Patient stated that she had patient do some deep breathing and oxygen level got up to 88-89%. Anda Kraft stated that she is concerned about getting the patient up to do any exercises because of the oxygen level. Anda Kraft stated that patient's weight is stable, hands are warm and does not seem to be in any distress. After speaking to [name] NP, Anda Kraft physical therapist was advised to contact patient's pulmonologist. Gennaro Africa was given Dr. Gustavus Bryant name and telephone number for her to contact him and she verbalized understanding. Gennaro Africa stated that she is not going to have patient do any exercises today, but will have her do some deep breathing exercises.

## 2019-04-17 NOTE — Telephone Encounter (Signed)
Noted and appreciate the advice.

## 2019-04-19 ENCOUNTER — Inpatient Hospital Stay (HOSPITAL_COMMUNITY)
Admission: EM | Admit: 2019-04-19 | Discharge: 2019-04-23 | DRG: 871 | Disposition: A | Discharge status: Hospice | Payer: Medicare HMO | Attending: Internal Medicine | Admitting: Internal Medicine

## 2019-04-19 ENCOUNTER — Emergency Department (HOSPITAL_COMMUNITY): Payer: Medicare HMO

## 2019-04-19 DIAGNOSIS — M2550 Pain in unspecified joint: Secondary | ICD-10-CM | POA: Diagnosis not present

## 2019-04-19 DIAGNOSIS — R64 Cachexia: Secondary | ICD-10-CM | POA: Diagnosis present

## 2019-04-19 DIAGNOSIS — Z8673 Personal history of transient ischemic attack (TIA), and cerebral infarction without residual deficits: Secondary | ICD-10-CM | POA: Diagnosis not present

## 2019-04-19 DIAGNOSIS — Z66 Do not resuscitate: Secondary | ICD-10-CM | POA: Diagnosis not present

## 2019-04-19 DIAGNOSIS — R778 Other specified abnormalities of plasma proteins: Secondary | ICD-10-CM | POA: Diagnosis present

## 2019-04-19 DIAGNOSIS — A4151 Sepsis due to Escherichia coli [E. coli]: Secondary | ICD-10-CM | POA: Diagnosis not present

## 2019-04-19 DIAGNOSIS — N390 Urinary tract infection, site not specified: Secondary | ICD-10-CM | POA: Diagnosis not present

## 2019-04-19 DIAGNOSIS — D72819 Decreased white blood cell count, unspecified: Secondary | ICD-10-CM | POA: Diagnosis present

## 2019-04-19 DIAGNOSIS — I4891 Unspecified atrial fibrillation: Secondary | ICD-10-CM | POA: Diagnosis present

## 2019-04-19 DIAGNOSIS — J189 Pneumonia, unspecified organism: Secondary | ICD-10-CM

## 2019-04-19 DIAGNOSIS — F0390 Unspecified dementia without behavioral disturbance: Secondary | ICD-10-CM | POA: Diagnosis present

## 2019-04-19 DIAGNOSIS — I2729 Other secondary pulmonary hypertension: Secondary | ICD-10-CM | POA: Diagnosis present

## 2019-04-19 DIAGNOSIS — I2510 Atherosclerotic heart disease of native coronary artery without angina pectoris: Secondary | ICD-10-CM | POA: Diagnosis present

## 2019-04-19 DIAGNOSIS — F419 Anxiety disorder, unspecified: Secondary | ICD-10-CM | POA: Diagnosis present

## 2019-04-19 DIAGNOSIS — Z85038 Personal history of other malignant neoplasm of large intestine: Secondary | ICD-10-CM

## 2019-04-19 DIAGNOSIS — R1312 Dysphagia, oropharyngeal phase: Secondary | ICD-10-CM | POA: Diagnosis present

## 2019-04-19 DIAGNOSIS — I5032 Chronic diastolic (congestive) heart failure: Secondary | ICD-10-CM | POA: Diagnosis not present

## 2019-04-19 DIAGNOSIS — E1122 Type 2 diabetes mellitus with diabetic chronic kidney disease: Secondary | ICD-10-CM | POA: Diagnosis present

## 2019-04-19 DIAGNOSIS — N1831 Chronic kidney disease, stage 3a: Secondary | ICD-10-CM | POA: Diagnosis not present

## 2019-04-19 DIAGNOSIS — I10 Essential (primary) hypertension: Secondary | ICD-10-CM | POA: Diagnosis present

## 2019-04-19 DIAGNOSIS — E872 Acidosis, unspecified: Secondary | ICD-10-CM | POA: Diagnosis present

## 2019-04-19 DIAGNOSIS — E1165 Type 2 diabetes mellitus with hyperglycemia: Secondary | ICD-10-CM | POA: Diagnosis not present

## 2019-04-19 DIAGNOSIS — J432 Centrilobular emphysema: Secondary | ICD-10-CM | POA: Diagnosis present

## 2019-04-19 DIAGNOSIS — E43 Unspecified severe protein-calorie malnutrition: Secondary | ICD-10-CM | POA: Diagnosis present

## 2019-04-19 DIAGNOSIS — J9622 Acute and chronic respiratory failure with hypercapnia: Secondary | ICD-10-CM | POA: Diagnosis present

## 2019-04-19 DIAGNOSIS — Z7901 Long term (current) use of anticoagulants: Secondary | ICD-10-CM

## 2019-04-19 DIAGNOSIS — J449 Chronic obstructive pulmonary disease, unspecified: Secondary | ICD-10-CM | POA: Diagnosis present

## 2019-04-19 DIAGNOSIS — R7989 Other specified abnormal findings of blood chemistry: Secondary | ICD-10-CM | POA: Diagnosis present

## 2019-04-19 DIAGNOSIS — D708 Other neutropenia: Secondary | ICD-10-CM | POA: Diagnosis not present

## 2019-04-19 DIAGNOSIS — J431 Panlobular emphysema: Secondary | ICD-10-CM | POA: Diagnosis not present

## 2019-04-19 DIAGNOSIS — N179 Acute kidney failure, unspecified: Secondary | ICD-10-CM | POA: Diagnosis not present

## 2019-04-19 DIAGNOSIS — R0609 Other forms of dyspnea: Secondary | ICD-10-CM | POA: Diagnosis not present

## 2019-04-19 DIAGNOSIS — J439 Emphysema, unspecified: Secondary | ICD-10-CM | POA: Diagnosis present

## 2019-04-19 DIAGNOSIS — R0902 Hypoxemia: Secondary | ICD-10-CM | POA: Diagnosis not present

## 2019-04-19 DIAGNOSIS — J9601 Acute respiratory failure with hypoxia: Secondary | ICD-10-CM | POA: Diagnosis not present

## 2019-04-19 DIAGNOSIS — R0600 Dyspnea, unspecified: Secondary | ICD-10-CM

## 2019-04-19 DIAGNOSIS — R4182 Altered mental status, unspecified: Secondary | ICD-10-CM | POA: Diagnosis not present

## 2019-04-19 DIAGNOSIS — I130 Hypertensive heart and chronic kidney disease with heart failure and stage 1 through stage 4 chronic kidney disease, or unspecified chronic kidney disease: Secondary | ICD-10-CM | POA: Diagnosis present

## 2019-04-19 DIAGNOSIS — J479 Bronchiectasis, uncomplicated: Secondary | ICD-10-CM | POA: Diagnosis present

## 2019-04-19 DIAGNOSIS — N183 Chronic kidney disease, stage 3 unspecified: Secondary | ICD-10-CM | POA: Diagnosis present

## 2019-04-19 DIAGNOSIS — Z9981 Dependence on supplemental oxygen: Secondary | ICD-10-CM | POA: Diagnosis not present

## 2019-04-19 DIAGNOSIS — I313 Pericardial effusion (noninflammatory): Secondary | ICD-10-CM | POA: Diagnosis present

## 2019-04-19 DIAGNOSIS — Z681 Body mass index (BMI) 19 or less, adult: Secondary | ICD-10-CM

## 2019-04-19 DIAGNOSIS — Z87891 Personal history of nicotine dependence: Secondary | ICD-10-CM

## 2019-04-19 DIAGNOSIS — Z79899 Other long term (current) drug therapy: Secondary | ICD-10-CM

## 2019-04-19 DIAGNOSIS — R627 Adult failure to thrive: Secondary | ICD-10-CM | POA: Diagnosis present

## 2019-04-19 DIAGNOSIS — Z7401 Bed confinement status: Secondary | ICD-10-CM | POA: Diagnosis not present

## 2019-04-19 DIAGNOSIS — I5031 Acute diastolic (congestive) heart failure: Secondary | ICD-10-CM | POA: Diagnosis not present

## 2019-04-19 DIAGNOSIS — J9621 Acute and chronic respiratory failure with hypoxia: Secondary | ICD-10-CM | POA: Diagnosis present

## 2019-04-19 DIAGNOSIS — R0689 Other abnormalities of breathing: Secondary | ICD-10-CM | POA: Diagnosis not present

## 2019-04-19 DIAGNOSIS — F329 Major depressive disorder, single episode, unspecified: Secondary | ICD-10-CM | POA: Diagnosis not present

## 2019-04-19 DIAGNOSIS — J9811 Atelectasis: Secondary | ICD-10-CM | POA: Diagnosis present

## 2019-04-19 DIAGNOSIS — Z20822 Contact with and (suspected) exposure to covid-19: Secondary | ICD-10-CM | POA: Diagnosis present

## 2019-04-19 DIAGNOSIS — E860 Dehydration: Secondary | ICD-10-CM | POA: Diagnosis present

## 2019-04-19 DIAGNOSIS — E7800 Pure hypercholesterolemia, unspecified: Secondary | ICD-10-CM | POA: Diagnosis not present

## 2019-04-19 DIAGNOSIS — Z1623 Resistance to quinolones and fluoroquinolones: Secondary | ICD-10-CM | POA: Diagnosis present

## 2019-04-19 DIAGNOSIS — R6520 Severe sepsis without septic shock: Secondary | ICD-10-CM | POA: Diagnosis not present

## 2019-04-19 DIAGNOSIS — Z90710 Acquired absence of both cervix and uterus: Secondary | ICD-10-CM

## 2019-04-19 DIAGNOSIS — I272 Pulmonary hypertension, unspecified: Secondary | ICD-10-CM | POA: Diagnosis present

## 2019-04-19 DIAGNOSIS — R0602 Shortness of breath: Secondary | ICD-10-CM | POA: Diagnosis not present

## 2019-04-19 DIAGNOSIS — Z515 Encounter for palliative care: Secondary | ICD-10-CM

## 2019-04-19 DIAGNOSIS — E785 Hyperlipidemia, unspecified: Secondary | ICD-10-CM | POA: Diagnosis present

## 2019-04-19 DIAGNOSIS — I082 Rheumatic disorders of both aortic and tricuspid valves: Secondary | ICD-10-CM | POA: Diagnosis present

## 2019-04-19 DIAGNOSIS — Z7951 Long term (current) use of inhaled steroids: Secondary | ICD-10-CM

## 2019-04-19 DIAGNOSIS — Z8041 Family history of malignant neoplasm of ovary: Secondary | ICD-10-CM

## 2019-04-19 DIAGNOSIS — Z7984 Long term (current) use of oral hypoglycemic drugs: Secondary | ICD-10-CM

## 2019-04-19 DIAGNOSIS — E210 Primary hyperparathyroidism: Secondary | ICD-10-CM | POA: Diagnosis present

## 2019-04-19 DIAGNOSIS — E119 Type 2 diabetes mellitus without complications: Secondary | ICD-10-CM

## 2019-04-19 DIAGNOSIS — K219 Gastro-esophageal reflux disease without esophagitis: Secondary | ICD-10-CM | POA: Diagnosis present

## 2019-04-19 DIAGNOSIS — N3000 Acute cystitis without hematuria: Secondary | ICD-10-CM | POA: Diagnosis not present

## 2019-04-19 DIAGNOSIS — I2781 Cor pulmonale (chronic): Secondary | ICD-10-CM | POA: Diagnosis present

## 2019-04-19 DIAGNOSIS — I248 Other forms of acute ischemic heart disease: Secondary | ICD-10-CM | POA: Diagnosis present

## 2019-04-19 LAB — POCT I-STAT 7, (LYTES, BLD GAS, ICA,H+H)
Acid-Base Excess: 4 mmol/L — ABNORMAL HIGH (ref 0.0–2.0)
Bicarbonate: 29.9 mmol/L — ABNORMAL HIGH (ref 20.0–28.0)
Calcium, Ion: 1.46 mmol/L — ABNORMAL HIGH (ref 1.15–1.40)
HCT: 36 % (ref 36.0–46.0)
Hemoglobin: 12.2 g/dL (ref 12.0–15.0)
O2 Saturation: 92 %
Potassium: 3.8 mmol/L (ref 3.5–5.1)
Sodium: 141 mmol/L (ref 135–145)
TCO2: 31 mmol/L (ref 22–32)
pCO2 arterial: 48.5 mmHg — ABNORMAL HIGH (ref 32.0–48.0)
pH, Arterial: 7.397 (ref 7.350–7.450)
pO2, Arterial: 64 mmHg — ABNORMAL LOW (ref 83.0–108.0)

## 2019-04-19 LAB — LACTIC ACID, PLASMA
Lactic Acid, Venous: 2.8 mmol/L (ref 0.5–1.9)
Lactic Acid, Venous: 3.1 mmol/L (ref 0.5–1.9)

## 2019-04-19 LAB — COMPREHENSIVE METABOLIC PANEL
ALT: 23 U/L (ref 0–44)
AST: 35 U/L (ref 15–41)
Albumin: 3.9 g/dL (ref 3.5–5.0)
Alkaline Phosphatase: 96 U/L (ref 38–126)
Anion gap: 10 (ref 5–15)
BUN: 22 mg/dL (ref 8–23)
CO2: 30 mmol/L (ref 22–32)
Calcium: 11.1 mg/dL — ABNORMAL HIGH (ref 8.9–10.3)
Chloride: 103 mmol/L (ref 98–111)
Creatinine, Ser: 1.15 mg/dL — ABNORMAL HIGH (ref 0.44–1.00)
GFR calc Af Amer: 55 mL/min — ABNORMAL LOW (ref >60)
GFR calc non Af Amer: 47 mL/min — ABNORMAL LOW (ref >60)
Glucose, Bld: 224 mg/dL — ABNORMAL HIGH (ref 70–99)
Potassium: 5 mmol/L (ref 3.5–5.1)
Sodium: 143 mmol/L (ref 135–145)
Total Bilirubin: 1 mg/dL (ref 0.3–1.2)
Total Protein: 6.8 g/dL (ref 6.5–8.1)

## 2019-04-19 LAB — CBC WITH DIFFERENTIAL/PLATELET
Abs Immature Granulocytes: 0.01 10*3/uL (ref 0.00–0.07)
Basophils Absolute: 0 10*3/uL (ref 0.0–0.1)
Basophils Relative: 1 %
Eosinophils Absolute: 0.1 10*3/uL (ref 0.0–0.5)
Eosinophils Relative: 2 %
HCT: 43.9 % (ref 36.0–46.0)
Hemoglobin: 13.2 g/dL (ref 12.0–15.0)
Immature Granulocytes: 0 %
Lymphocytes Relative: 20 %
Lymphs Abs: 0.8 10*3/uL (ref 0.7–4.0)
MCH: 26.6 pg (ref 26.0–34.0)
MCHC: 30.1 g/dL (ref 30.0–36.0)
MCV: 88.5 fL (ref 80.0–100.0)
Monocytes Absolute: 0.2 10*3/uL (ref 0.1–1.0)
Monocytes Relative: 6 %
Neutro Abs: 2.8 10*3/uL (ref 1.7–7.7)
Neutrophils Relative %: 71 %
Platelets: 175 10*3/uL (ref 150–400)
RBC: 4.96 MIL/uL (ref 3.87–5.11)
RDW: 14.3 % (ref 11.5–15.5)
WBC: 3.9 10*3/uL — ABNORMAL LOW (ref 4.0–10.5)
nRBC: 0 % (ref 0.0–0.2)

## 2019-04-19 LAB — POC SARS CORONAVIRUS 2 AG -  ED: SARS Coronavirus 2 Ag: NEGATIVE

## 2019-04-19 LAB — URINALYSIS, ROUTINE W REFLEX MICROSCOPIC
Bilirubin Urine: NEGATIVE
Glucose, UA: NEGATIVE mg/dL
Hgb urine dipstick: NEGATIVE
Ketones, ur: NEGATIVE mg/dL
Nitrite: POSITIVE — AB
Protein, ur: NEGATIVE mg/dL
Specific Gravity, Urine: 1.011 (ref 1.005–1.030)
pH: 6 (ref 5.0–8.0)

## 2019-04-19 LAB — I-STAT CHEM 8, ED
BUN: 25 mg/dL — ABNORMAL HIGH (ref 8–23)
Calcium, Ion: 1.33 mmol/L (ref 1.15–1.40)
Chloride: 103 mmol/L (ref 98–111)
Creatinine, Ser: 1.1 mg/dL — ABNORMAL HIGH (ref 0.44–1.00)
Glucose, Bld: 204 mg/dL — ABNORMAL HIGH (ref 70–99)
HCT: 43 % (ref 36.0–46.0)
Hemoglobin: 14.6 g/dL (ref 12.0–15.0)
Potassium: 4.2 mmol/L (ref 3.5–5.1)
Sodium: 142 mmol/L (ref 135–145)
TCO2: 31 mmol/L (ref 22–32)

## 2019-04-19 LAB — TROPONIN I (HIGH SENSITIVITY): Troponin I (High Sensitivity): 34 ng/L — ABNORMAL HIGH (ref <18)

## 2019-04-19 LAB — GLUCOSE, CAPILLARY: Glucose-Capillary: 187 mg/dL — ABNORMAL HIGH (ref 70–99)

## 2019-04-19 LAB — PROTIME-INR
INR: 2 — ABNORMAL HIGH (ref 0.8–1.2)
Prothrombin Time: 22.4 seconds — ABNORMAL HIGH (ref 11.4–15.2)

## 2019-04-19 LAB — BRAIN NATRIURETIC PEPTIDE: B Natriuretic Peptide: 1774.6 pg/mL — ABNORMAL HIGH (ref 0.0–100.0)

## 2019-04-19 LAB — MAGNESIUM: Magnesium: 1.8 mg/dL (ref 1.7–2.4)

## 2019-04-19 LAB — APTT: aPTT: 41 seconds — ABNORMAL HIGH (ref 24–36)

## 2019-04-19 LAB — CBG MONITORING, ED: Glucose-Capillary: 129 mg/dL — ABNORMAL HIGH (ref 70–99)

## 2019-04-19 MED ORDER — INSULIN ASPART 100 UNIT/ML ~~LOC~~ SOLN
0.0000 [IU] | Freq: Three times a day (TID) | SUBCUTANEOUS | Status: DC
  Administered 2019-04-19 – 2019-04-21 (×2): 2 [IU] via SUBCUTANEOUS
  Administered 2019-04-21: 3 [IU] via SUBCUTANEOUS
  Administered 2019-04-22: 2 [IU] via SUBCUTANEOUS
  Administered 2019-04-22: 5 [IU] via SUBCUTANEOUS

## 2019-04-19 MED ORDER — ALBUTEROL SULFATE HFA 108 (90 BASE) MCG/ACT IN AERS
2.0000 | INHALATION_SPRAY | Freq: Once | RESPIRATORY_TRACT | Status: AC
  Administered 2019-04-19: 2 via RESPIRATORY_TRACT

## 2019-04-19 MED ORDER — ONDANSETRON HCL 4 MG PO TABS: 4.0000 mg | ORAL_TABLET | Freq: Four times a day (QID) | ORAL | Status: DC | PRN

## 2019-04-19 MED ORDER — METHYLPREDNISOLONE SODIUM SUCC 125 MG IJ SOLR
60.0000 mg | Freq: Two times a day (BID) | INTRAMUSCULAR | Status: DC
  Administered 2019-04-19 – 2019-04-23 (×8): 60 mg via INTRAVENOUS
  Filled 2019-04-19 (×7): qty 2

## 2019-04-19 MED ORDER — SODIUM CHLORIDE 0.9 % IV SOLN
1.0000 g | INTRAVENOUS | Status: DC
  Administered 2019-04-20 – 2019-04-22 (×3): 1 g via INTRAVENOUS
  Filled 2019-04-19: qty 1
  Filled 2019-04-19: qty 10
  Filled 2019-04-19 (×2): qty 1

## 2019-04-19 MED ORDER — SODIUM CHLORIDE 0.9 % IV SOLN: INTRAVENOUS | Status: DC

## 2019-04-19 MED ORDER — INSULIN ASPART 100 UNIT/ML ~~LOC~~ SOLN: 0.0000 [IU] | Freq: Every day | SUBCUTANEOUS | Status: DC

## 2019-04-19 MED ORDER — ONDANSETRON HCL 4 MG/2ML IJ SOLN: 4.0000 mg | Freq: Four times a day (QID) | INTRAMUSCULAR | Status: DC | PRN

## 2019-04-19 MED ORDER — SODIUM CHLORIDE 0.9 % IV SOLN
500.0000 mg | INTRAVENOUS | Status: DC
  Administered 2019-04-19 – 2019-04-21 (×3): 500 mg via INTRAVENOUS
  Filled 2019-04-19 (×5): qty 500

## 2019-04-19 MED ORDER — SODIUM CHLORIDE 0.9 % IV BOLUS
1000.0000 mL | Freq: Once | INTRAVENOUS | Status: AC
  Administered 2019-04-19: 1000 mL via INTRAVENOUS

## 2019-04-19 MED ORDER — ALBUTEROL SULFATE HFA 108 (90 BASE) MCG/ACT IN AERS
4.0000 | INHALATION_SPRAY | Freq: Once | RESPIRATORY_TRACT | Status: AC
  Administered 2019-04-19: 4 via RESPIRATORY_TRACT
  Filled 2019-04-19: qty 6.7

## 2019-04-19 MED ORDER — ACETAMINOPHEN 325 MG PO TABS: 650.0000 mg | ORAL_TABLET | Freq: Four times a day (QID) | ORAL | Status: DC | PRN

## 2019-04-19 MED ORDER — SODIUM CHLORIDE 0.9 % IV SOLN
1.0000 g | Freq: Once | INTRAVENOUS | Status: AC
  Administered 2019-04-19: 1 g via INTRAVENOUS
  Filled 2019-04-19: qty 10

## 2019-04-19 MED ORDER — IPRATROPIUM-ALBUTEROL 0.5-2.5 (3) MG/3ML IN SOLN
3.0000 mL | Freq: Two times a day (BID) | RESPIRATORY_TRACT | Status: DC
  Administered 2019-04-20: 3 mL via RESPIRATORY_TRACT
  Filled 2019-04-19: qty 3

## 2019-04-19 MED ORDER — IPRATROPIUM-ALBUTEROL 0.5-2.5 (3) MG/3ML IN SOLN
3.0000 mL | RESPIRATORY_TRACT | Status: DC
  Administered 2019-04-19 (×2): 3 mL via RESPIRATORY_TRACT
  Filled 2019-04-19 (×2): qty 3

## 2019-04-19 MED ORDER — ACETAMINOPHEN 650 MG RE SUPP: 650.0000 mg | Freq: Four times a day (QID) | RECTAL | Status: DC | PRN

## 2019-04-19 MED ORDER — METHYLPREDNISOLONE SODIUM SUCC 125 MG IJ SOLR
125.0000 mg | Freq: Once | INTRAMUSCULAR | Status: AC
  Administered 2019-04-19: 125 mg via INTRAVENOUS
  Filled 2019-04-19: qty 2

## 2019-04-19 NOTE — ED Notes (Signed)
IV team consulted, stuck by two RNs x2, unable to obtain better than 24G

## 2019-04-19 NOTE — H&P (Signed)
History and Physical    Denise [name] INO:[id number] DOB: September 06, 1945 DOA: 04/19/2019  PCP: [name], NP  Patient coming from: Home  I have personally briefly reviewed patient's old medical records in [place]  Chief Complaint: Worsening shortness of breath since couple of days HPI: Denise [name] is a 74 y.o. female with medical history significant of hypertension, hyperlipidemia, type 2 diabetes mellitus, chronic hypoxemic respiratory failure due to underlying COPD-on  5 L of oxygen at home, pulmonary hypertension, ischemic stroke s/p TPA-currently on Xarelto, protein calorie malnutrition, grade 1 diastolic congestive heart failure, presents to emergency department due to worsening shortness of breath since couple of days.  Patient tells me that she has worsening shortness of breath since couple of days.  Has been feeling unwell.  She denies association with wheezing, cough, congestion, sputum production, fever, chills, leg swelling, orthopnea, PND, chest pain, headache, blurry vision, abdominal pain, decreased appetite or bowel changes.  Reports increased urinary frequency and strong smell since couple of days and associated with lower abdominal pain.  She denies nausea, vomiting, dysuria, hematuria.  She lives with her husband at home.  No history of smoking, alcohol, illicit drug use.  She has home health coming out to the house several days a week.  Upon arrival of EMS: She noted to have hypoxia in 60s she placed on oxygen mask by EMS.  ED Course: Upon arrival to ED: Patient tachypneic, afebrile with WBC of 3.9, lactic acid 2.8 trended up to 3.1, troponin: 34, UA positive for nitrates and leukocytes, POC COVID-19 negative.  She is requiring 6 L of oxygen via nasal cannula in ED.  She received loading dose of IV Solu-Medrol, IV fluid bolus and albuterol breathing treatment in ED.  She also received IV Rocephin for UTI.  [facility] hospital[facility] consulted for admission  for acute on chronic hypoxemic respiratory failure.  Review of Systems: As per HPI otherwise negative.    Past Medical History:  Diagnosis Date  . Acute blood loss anemia   . Acute ischemic right MCA stroke ([place]) 11/27/2018  . Acute on chronic diastolic CHF (congestive heart failure) ([place]) 12/30/2018  . Acute on chronic respiratory failure with hypercapnia ([place]) 10/28/2018  . Acute on chronic respiratory failure with hypoxia ([place]) 12/30/2018  . Altered mental status   . Anxiety and depression   . Borderline abnormal TFTs 05/02/2018  . Cerebellar stroke, acute ([place]) 11/08/2018  . CHF (congestive heart failure) ([place]) 05/16/2018  . Chronic respiratory failure with hypoxia ([place]) 10/02/2017   Assoc with ? Cor pulmonale dx 09/2017 so rx 02 1-2 lpm 24/7  - 10/17/2017  Walked RA x 3 laps @ 185 ft each stopped due to  End of study, nl pace,  desat to 87 on 3rd lap - 12/19/2017  Saturations on Room Air at Rest =91 % and while Ambulating = 87%  But  on  2 Liters of pulsed oxygen while Ambulating =93% so rec POC 2lpm walking / use at rest if sats under 90%      . Chronic respiratory failure with hypoxia and hypercapnia ([place]) 05/03/2018   Assoc with   Cor pulmonale dx 09/2017 so rx 02 1-2 lpm 24/7  - 10/17/2017  Walked RA x 3 laps @ 185 ft each stopped due to  End of study, nl pace,  desat to 87 on 3rd lap - 12/19/2017  Saturations on Room Air at Rest =91 % and while Ambulating = 87%  But  on  2 Liters of pulsed oxygen while Ambulating =93% so rec POC 2lpm walking / use at rest if sats under 90%  - HC03 05/02/2018  = 34  - HC03 11/16  . Colon cancer ([place]) 1988   Resected  . Constipation   . COPD (chronic obstructive pulmonary disease) ([place])   . COPD GOLD  II  spirometry, severe Emphysema and 02 dep 11/22/2015   Quit smoking 1989 - Spirometry 09/11/2016  FEV1 1.03 (52%)  Ratio 54 mild curvature, on no rx - 09/11/2016  After extensive coaching HFA effectiveness =    50% from baseline 10% > continue  symb 160 2bid  but consider change to bevespi or stiolto  - 10/02/2017  After extensive coaching inhaler device,  effectiveness =    50% from a baseline near 0 > rechallenge with symbicort 160 x 2 bid x 2 weeks the  . COPD with acute exacerbation ([place]) 10/28/2018  . Diabetes mellitus without complication ([name])    diet controlled- no meds. per pt  . Diarrhea 04/02/2018  . Diverticulosis   . Elevated troponin 10/28/2018  . Essential hypertension 11/22/2015  . GERD (gastroesophageal reflux disease) 10/28/2018  . Hematuria 09/27/2017  . History of colon cancer 06/11/1986   Resected 1988, [place]  . Hx of colonic polyps 02/10/2013  . Hyperlipidemia   . Hyperparathyroidism, primary ([place]) 07/27/2016  . Hypertension   . Hypertensive urgency 11/08/2018  . Hypoxia 10/30/2018  . ICH (intracerebral hemorrhage) ([place]) s/p tPA administration 11/08/2018  . Insomnia   . Lactic acidosis 10/28/2018  . Loss of appetite 06/02/2016  . Lower extremity edema 02/20/2018  . On home oxygen therapy 07/12/2018  . Primary hyperparathyroidism ([place])   . Protein-calorie malnutrition, severe 09/17/2017  . Pulmonary hypertension ([name]) c/w cor pulmonale    See [facility] 09/20/17  With  low CO ? Accuracy of PVR  - tyvaso d/c'd 10/03/18 by [facility] due to cough, improved     . SBO (small bowel obstruction) ([place])   . Stroke ([place]) 10/2018   tpa administered  . Supplemental oxygen dependent   . Tubular adenoma of rectum 02/23/2013   low grade  . Type 2 diabetes mellitus ([place]) 04/26/2018  . Unintended weight loss 04/26/2018  . UTI (urinary tract infection) 03/05/2019  . Vaginal candidiasis   . Vitamin D deficiency     Past Surgical History:  Procedure Laterality Date  . ABDOMINAL HYSTERECTOMY  08/2015  . BREAST BIOPSY Left   . BREAST EXCISIONAL BIOPSY Right   . COLON RESECTION  1980's  . COLONOSCOPY    . ENDOMETRIAL BIOPSY  2009   negative  . INCONTINENCE SURGERY  2017  . LOOP RECORDER INSERTION N/A 11/08/2018   Procedure: LOOP RECORDER  INSERTION;  Surgeon: Constance Haw, MD;  Location: [facility];  Service: Cardiovascular;  Laterality: N/A;  . RIGHT HEART CATH N/A 09/20/2017   Procedure: RIGHT HEART CATH;  Surgeon: Larey Dresser, MD;  Location: [facility];  Service: Cardiovascular;  Laterality: N/A;  . RIGHT/LEFT HEART CATH AND CORONARY ANGIOGRAPHY N/A 03/19/2018   Procedure: RIGHT/LEFT HEART CATH AND CORONARY ANGIOGRAPHY;  Surgeon: Larey Dresser, MD;  Location: [facility];  Service: Cardiovascular;  Laterality: N/A;     reports that she quit smoking about 32 years ago. Her smoking use included cigarettes. She has a 30.00 pack-year smoking history. She has never used smokeless tobacco. She reports that she does not drink alcohol or use drugs.  No Known  Allergies  Family History  Problem Relation Age of Onset  . Ovarian cancer Mother   . Breast cancer Neg Hx   . Hyperparathyroidism Neg Hx   . Colon cancer Neg Hx   . Esophageal cancer Neg Hx   . Stomach cancer Neg Hx     Prior to Admission medications   Medication Sig Start Date End Date Taking? Authorizing Provider  albuterol (VENTOLIN HFA) 108 (90 Base) MCG/ACT inhaler INHALE 1-2 PUFFS INTO THE LUNGS EVERY 4 HOURS AS NEEDED FOR WHEEZING OR SHORTNESS OF BREATH. 04/30/18   Parrett, Fonnie Mu, NP  atorvastatin (LIPITOR) 20 MG tablet Take 1 tablet (20 mg total) by mouth every evening. For cholesterol 12/12/18   [name], NP  bisoprolol (ZEBETA) 5 MG tablet Take 2.5 mg by mouth daily. 01/25/19   [provider]  Blood Glucose Calibration (ACCU-CHEK AVIVA) SOLN USE AS INSTRUCTED TO TEST BLOOD SUGAR DAILY 05/28/18   [name], NP  Blood Glucose Monitoring Suppl (ACCU-CHEK AVIVA PLUS) w/Device KIT Use as instructed to test blood sugar up to 3 times daily 05/28/18   [name], NP  famotidine (PEPCID) 40 MG tablet Take 1 tablet twice 01/28/19   Tanda Rockers, MD  fluticasone furoate-vilanterol (BREO ELLIPTA)  100-25 MCG/INH AEPB Inhale 1 puff into the lungs daily.    [provider]  furosemide (LASIX) 40 MG tablet Take 40 mg by mouth daily.     [provider]  glipiZIDE (GLUCOTROL) 5 MG tablet Take 1 tablet (5 mg total) by mouth daily before breakfast. 01/07/19   [name], NP  glucose blood (ACCU-CHEK AVIVA PLUS) test strip Use as instructed to test blood sugar up to 3 times daily 05/28/18   [name], NP  KLOR-CON M10 10 MEQ tablet TAKE 2 TABLETS (20 MEQ TOTAL) BY MOUTH DAILY. FOR LOW POTASSIUM. 01/27/19   Larey Dresser, MD  Lancets (ACCU-CHEK SOFT [facility]) lancets Use as instructed to test blood sugar up to 3 times daily 05/28/18   [name], NP  mirtazapine (REMERON) 15 MG tablet TAKE 1 TABLET BY MOUTH AT BEDTIME. FOR DEPRESSION AND APPETITE. 09/25/18   [name], NP  OXYGEN Inhale 4 L into the lungs continuous.     [provider]  polyethylene glycol powder (GLYCOLAX/MIRALAX) 17 GM/SCOOP powder Take 17 g by mouth daily as needed for mild constipation (TO BE MIXED INTO 4-8 OUNCES OF LIQUID, THEN CONSUMED).  08/13/15   [provider]  rivaroxaban (XARELTO) 10 MG TABS tablet Take 1 tablet (10 mg total) by mouth daily. For stroke prevention. 01/27/19   [name], NP  spironolactone (ALDACTONE) 25 MG tablet Take 1 tablet (25 mg total) by mouth daily. Take at night 01/30/19   Larey Dresser, MD  umeclidinium bromide (INCRUSE ELLIPTA) 62.5 MCG/INH AEPB Inhale 1 puff into the lungs daily. 11/20/18   Bary Leriche, PA-C    Physical Exam: Vitals:   04/19/19 1215 04/19/19 1315 04/19/19 1445 04/19/19 1515  BP: (!) 143/78 (!) 153/97 (!) 182/111 (!) 170/96  Pulse:    70  Resp: (!) 29 (!) 21 (!) 32 (!) 27  Temp:      TempSrc:      SpO2:   90% 90%    Constitutional: In mild respiratory distress, on 6 L of oxygen via nasal cannula, very thin and lean, communicating well Eyes: PERRL, lids and conjunctivae normal ENMT: Mucous  membranes are moist. Posterior pharynx clear  of any exudate or lesions.Normal dentition.  Neck: normal, supple, no masses, no thyromegaly Respiratory: Tachypneic, decreased breath sounds noted on bases.  No wheezing, rhonchi or crackles  cardiovascular: Regular rate and rhythm, no murmurs / rubs / gallops. No extremity edema. 2+ pedal pulses. No carotid bruits.  Abdomen: no tenderness, no masses palpated. No hepatosplenomegaly. Bowel sounds positive.  Musculoskeletal: no clubbing / cyanosis. No joint deformity upper and lower extremities. Good ROM, no contractures. Normal muscle tone.  Skin: no rashes, lesions, ulcers. No induration Neurologic: CN 2-12 grossly intact. Sensation intact, DTR normal. Strength 5/5 in all 4.  Psychiatric: Normal judgment and insight. Alert and oriented x 3. Normal mood.    Labs on Admission: I have personally reviewed following labs and imaging studies  CBC: Recent Labs  Lab 04/19/19 1215 04/19/19 1217 04/19/19 1251  WBC  --  3.9*  --   NEUTROABS  --  2.8  --   HGB 14.6 13.2 12.2  HCT 43.0 43.9 36.0  MCV  --  88.5  --   PLT  --  175  --    Basic Metabolic Panel: Recent Labs  Lab 04/19/19 1215 04/19/19 1217 04/19/19 1251  NA 142 143 141  K 4.2 5.0 3.8  CL 103 103  --   CO2  --  30  --   GLUCOSE 204* 224*  --   BUN 25* 22  --   CREATININE 1.10* 1.15*  --   CALCIUM  --  11.1*  --    GFR: Estimated Creatinine Clearance: 27.6 mL/min (A) (by C-G formula based on SCr of 1.15 mg/dL (H)). Liver Function Tests: Recent Labs  Lab 04/19/19 1217  AST 35  ALT 23  ALKPHOS 96  BILITOT 1.0  PROT 6.8  ALBUMIN 3.9   No results for input(s): LIPASE, AMYLASE in the last 168 hours. No results for input(s): AMMONIA in the last 168 hours. Coagulation Profile: Recent Labs  Lab 04/19/19 1217  INR 2.0*   Cardiac Enzymes: No results for input(s): CKTOTAL, CKMB, CKMBINDEX, TROPONINI in the last 168 hours. BNP (last 3 results) Recent Labs     05/02/18 1249 06/24/18 1111  PROBNP 910.0* 840.0*   HbA1C: No results for input(s): HGBA1C in the last 72 hours. CBG: No results for input(s): GLUCAP in the last 168 hours. Lipid Profile: No results for input(s): CHOL, HDL, LDLCALC, TRIG, CHOLHDL, LDLDIRECT in the last 72 hours. Thyroid Function Tests: No results for input(s): TSH, T4TOTAL, FREET4, T3FREE, THYROIDAB in the last 72 hours. Anemia Panel: No results for input(s): VITAMINB12, FOLATE, FERRITIN, TIBC, IRON, RETICCTPCT in the last 72 hours. Urine analysis:    Component Value Date/Time   COLORURINE YELLOW 04/19/2019 [place] 04/19/2019 1449   LABSPEC 1.011 04/19/2019 1449   PHURINE 6.0 04/19/2019 1449   GLUCOSEU NEGATIVE 04/19/2019 1449   HGBUR NEGATIVE 04/19/2019 1449   BILIRUBINUR NEGATIVE 04/19/2019 1449   BILIRUBINUR Negative 09/28/2017 [place] 04/19/2019 1449   PROTEINUR NEGATIVE 04/19/2019 1449   UROBILINOGEN 0.2 09/28/2017 1604   NITRITE POSITIVE (A) 04/19/2019 1449   LEUKOCYTESUR TRACE (A) 04/19/2019 1449    Radiological Exams on Admission: DG Chest Port 1 View  Result Date: 04/19/2019 CLINICAL DATA:  Dyspnea, hypoxia, COPD EXAM: PORTABLE CHEST 1 VIEW COMPARISON:  12/30/2018 chest radiograph. FINDINGS: Loop recorder overlies the lower left chest. Stable cardiomediastinal silhouette with normal heart size. No pneumothorax. No pleural effusion. No pulmonary edema. No acute consolidative airspace disease. Emphysema. IMPRESSION:  No acute cardiopulmonary disease.  Emphysema. Electronically Signed   By: Ilona Sorrel M.D.   On: 04/19/2019 12:09    EKG: Independently reviewed.  Sinus rhythm, atrial premature complexes, LVH, no ST elevation or depression.  Assessment/Plan Active Problems:   COPD (chronic obstructive pulmonary disease) ([name])   Hyperlipidemia   Essential hypertension   Type 2 diabetes mellitus ([name])   Elevated troponin   Lactic acidosis   GERD (gastroesophageal  reflux disease)   Acute on chronic respiratory failure with hypercapnia ([name])   History of ischemic stroke   Acute on chronic respiratory failure with hypoxia ([name])   Chronic diastolic CHF (congestive heart failure) ([name])   UTI (urinary tract infection)   Leukopenia   CKD (chronic kidney disease), stage III   Acute on chronic respiratory failure with hypoxemia ([name])   Acute on chronic hypoxemic respiratory failure: -Likely in the setting of COPD exacerbation.  On 5 L of oxygen at baseline.  Currently requiring 6 L via nasal cannula.  She is tachypneic and her lactic acid is elevated however she does not appears to be septic. -Patient is afebrile with no leukocytosis.  POC COVID-19 negative -Chest x-ray is negative for acute findings.  She is euvolemic.  Ordered BNP. -Received albuterol breathing treatment, loading dose of Solu-Medrol in the ED -Admit patient to stepdown unit for close monitoring. -On continuous pulse ox.  Try to wean off of oxygen as tolerated. -Continue Solu-Medrol IV twice daily, start on azithromycin.  DuoNebs every 4 hours. -Incentive spirometry. -Monitor vitals closely.   UTI: -Patient reports strong smell with increased urinary frequency.  UA is positive for nitrites and leukocytes.  She is afebrile. -Received Rocephin once in ED.  We will continue Rocephin.  Urine culture is pending-consider de-escalating antibiotics based on urine culture result.  Lactic acidosis: -Likely secondary to dehydration.  Patient does not appears to be septic.  Received IV fluid bolus in the ED.  We will continue IV fluids.  Grade 1 diastolic congestive heart failure: Patient is euvolemic -Chest x-ray is negative.  No leg swelling noted on exam -Check BNP.  Reviewed recent echo. -Strict INO's and daily weight.  Check magnesium level -Continue Aldactone, Lasix.  Elevated troponin: First troponin 34. -She denies any ACS symptoms.  Likely demand ischemia. -Trend troponin.  EKG no  ST-T wave changes noted. Type 2 diabetes mellitus: Check A1c -Hold glipizide for now and start patient on sliding scale insulin and monitor blood sugar closely  Ischemic stroke: Continue statin and Xarelto  Severe protein calorie malnutrition/failure to thrive: -We will consult dietitian.  Hyperlipidemia: Continue statin  GERD: Continue Pepcid  Hypertension: Stable -Continue Aldactone, bisoprolol  CKD stage III: Stable -Continue to monitor.  Leukopenia: WBC of 3.9. -Repeat CBC tomorrow AM.  Unable to safely start patient's home medication as med reconciliation is pending by pharmacy.  DVT prophylaxis: Xarelto/SCD/TED Code Status: Full code-confirmed with the patient. Family Communication: None present at bedside.  Plan of care discussed with patient in length and she verbalized understanding and agreed with it. I called patient's husband over the phone and discussed about patient's status and he verbalized understanding Disposition Plan: Likely home in 2 to 3 days Consults called: None Admission status: Inpatient   Mckinley Jewel MD [facility] Pager [phone]  If 7PM-7AM, please contact night-coverage [email or web address] Password [facility]  04/19/2019, 4:07 PM

## 2019-04-19 NOTE — ED Notes (Signed)
Admitting at bedside 

## 2019-04-19 NOTE — ED Notes (Signed)
[name], patient's husband wanted to leave his cell phone number to be reached [phone].

## 2019-04-19 NOTE — ED Provider Notes (Signed)
[facility] EMERGENCY DEPARTMENT Provider Note   CSN: [id number] Arrival date & time: 04/19/19  1118     History Chief Complaint  Patient presents with  . Shortness of Breath    Denise [name] is a 74 y.o. female.  The history is provided by the patient, the EMS personnel and medical records. No language interpreter was used.  Shortness of Breath  Denise [name] is a 74 y.o. female who presents to the Emergency Department complaining of sob. She presents the emergency department by EMS for evaluation of shortness of breath. Per EMS she is at increased shortness of breath the last 2 to 3 days. Per the patient she has been feeling unwell and short of breath for the last few weeks. She currently lives at home and has home health coming out to the house several days a week. She is on 2 L of oxygen at baseline. She has been experiencing increased difficulty breathing with hypoxia and sat down to the 60s per EMS. She does report increase nasal congestion and has been unable to breathe through her nose very well. No reports of fevers, nausea, vomiting. She does report some general abdominal discomfort. There is also report of decreased urinary output and dark urine. She lives at home with her husband. No known COVID 19 exposures.    Past Medical History:  Diagnosis Date  . Acute blood loss anemia   . Acute ischemic right MCA stroke ([place]) 11/27/2018  . Acute on chronic diastolic CHF (congestive heart failure) ([place]) 12/30/2018  . Acute on chronic respiratory failure with hypercapnia ([place]) 10/28/2018  . Acute on chronic respiratory failure with hypoxia ([place]) 12/30/2018  . Altered mental status   . Anxiety and depression   . Borderline abnormal TFTs 05/02/2018  . Cerebellar stroke, acute ([place]) 11/08/2018  . CHF (congestive heart failure) ([place]) 05/16/2018  . Chronic respiratory failure with hypoxia ([place]) 10/02/2017   Assoc with ? Cor pulmonale dx 09/2017 so rx 02 1-2  lpm 24/7  - 10/17/2017  Walked RA x 3 laps @ 185 ft each stopped due to  End of study, nl pace,  desat to 87 on 3rd lap - 12/19/2017  Saturations on Room Air at Rest =91 % and while Ambulating = 87%  But  on  2 Liters of pulsed oxygen while Ambulating =93% so rec POC 2lpm walking / use at rest if sats under 90%      . Chronic respiratory failure with hypoxia and hypercapnia ([place]) 05/03/2018   Assoc with   Cor pulmonale dx 09/2017 so rx 02 1-2 lpm 24/7  - 10/17/2017  Walked RA x 3 laps @ 185 ft each stopped due to  End of study, nl pace,  desat to 87 on 3rd lap - 12/19/2017  Saturations on Room Air at Rest =91 % and while Ambulating = 87%  But  on  2 Liters of pulsed oxygen while Ambulating =93% so rec POC 2lpm walking / use at rest if sats under 90%  - HC03 05/02/2018  = 34  - HC03 11/16  . Colon cancer ([place]) 1988   Resected  . Constipation   . COPD (chronic obstructive pulmonary disease) ([place])   . COPD GOLD  II  spirometry, severe Emphysema and 02 dep 11/22/2015   Quit smoking 1989 - Spirometry 09/11/2016  FEV1 1.03 (52%)  Ratio 54 mild curvature, on no rx - 09/11/2016  After extensive coaching HFA effectiveness =    50% from  baseline 10% > continue  symb 160 2bid but consider change to bevespi or stiolto  - 10/02/2017  After extensive coaching inhaler device,  effectiveness =    50% from a baseline near 0 > rechallenge with symbicort 160 x 2 bid x 2 weeks the  . COPD with acute exacerbation ([place]) 10/28/2018  . Diabetes mellitus without complication ([name])    diet controlled- no meds. per pt  . Diarrhea 04/02/2018  . Diverticulosis   . Elevated troponin 10/28/2018  . Essential hypertension 11/22/2015  . GERD (gastroesophageal reflux disease) 10/28/2018  . Hematuria 09/27/2017  . History of colon cancer 06/11/1986   Resected 1988, [place]  . Hx of colonic polyps 02/10/2013  . Hyperlipidemia   . Hyperparathyroidism, primary ([place]) 07/27/2016  . Hypertension   . Hypertensive urgency 11/08/2018  .  Hypoxia 10/30/2018  . ICH (intracerebral hemorrhage) ([place]) s/p tPA administration 11/08/2018  . Insomnia   . Lactic acidosis 10/28/2018  . Loss of appetite 06/02/2016  . Lower extremity edema 02/20/2018  . On home oxygen therapy 07/12/2018  . Primary hyperparathyroidism ([place])   . Protein-calorie malnutrition, severe 09/17/2017  . Pulmonary hypertension ([name]) c/w cor pulmonale    See [facility] 09/20/17  With  low CO ? Accuracy of PVR  - tyvaso d/c'd 10/03/18 by [facility]clean due to cough, improved     . SBO (small bowel obstruction) ([place])   . Stroke ([place]) 10/2018   tpa administered  . Supplemental oxygen dependent   . Tubular adenoma of rectum 02/23/2013   low grade  . Type 2 diabetes mellitus ([place]) 04/26/2018  . Unintended weight loss 04/26/2018  . UTI (urinary tract infection) 03/05/2019  . Vaginal candidiasis   . Vitamin D deficiency     Patient Active Problem List   Diagnosis Date Noted  . Acute on chronic respiratory failure with hypoxemia ([place]) 04/19/2019  . Leukopenia   . CKD (chronic kidney disease), stage III   . UTI (urinary tract infection) 03/05/2019  . Acute on chronic respiratory failure with hypoxia ([place]) 12/30/2018  . Chronic diastolic CHF (congestive heart failure) ([place]) 12/30/2018  . Acute ischemic right MCA stroke ([place]) 11/27/2018  . Vaginal candidiasis   . Acute blood loss anemia   . Supplemental oxygen dependent   . ICH (intracerebral hemorrhage) ([place]) s/p tPA administration 11/08/2018  . Hypertensive urgency 11/08/2018  . Cerebellar stroke, acute ([place]) 11/08/2018  . History of ischemic stroke 11/04/2018  . COPD with acute exacerbation ([place]) 10/28/2018  . Acute on chronic systolic CHF (congestive heart failure) ([place]) 10/28/2018  . Elevated troponin 10/28/2018  . Lactic acidosis 10/28/2018  . GERD (gastroesophageal reflux disease) 10/28/2018  . Acute on chronic respiratory failure with hypercapnia ([place]) 10/28/2018  . On home oxygen therapy 07/12/2018  . CHF (congestive heart  failure) ([place]) 05/16/2018  . Chronic respiratory failure with hypoxia and hypercapnia ([place]) 05/03/2018  . Borderline abnormal TFTs 05/02/2018  . Type 2 diabetes mellitus ([place]) 04/26/2018  . Unintended weight loss 04/26/2018  . Lower extremity edema 02/20/2018  . Chronic respiratory failure with hypoxia ([place]) 10/02/2017  . Hematuria 09/27/2017  . Pulmonary hypertension ([name]) c/w cor pulmonale   . Protein-calorie malnutrition, severe 09/17/2017  . Constipation   . Hyperparathyroidism, primary ([place]) 07/27/2016  . Loss of appetite 06/02/2016  . COPD (chronic obstructive pulmonary disease) ([place]) 11/22/2015  . Vitamin D deficiency 11/22/2015  . Anxiety and depression 11/22/2015  . Insomnia 11/22/2015  . Hyperlipidemia 11/22/2015  . Essential hypertension 11/22/2015  . Hx  of colonic polyps 02/10/2013  . History of colon cancer 06/11/1986    Past Surgical History:  Procedure Laterality Date  . ABDOMINAL HYSTERECTOMY  08/2015  . BREAST BIOPSY Left   . BREAST EXCISIONAL BIOPSY Right   . COLON RESECTION  1980's  . COLONOSCOPY    . ENDOMETRIAL BIOPSY  2009   negative  . INCONTINENCE SURGERY  2017  . LOOP RECORDER INSERTION N/A 11/08/2018   Procedure: LOOP RECORDER INSERTION;  Surgeon: Constance Haw, MD;  Location: [facility];  Service: Cardiovascular;  Laterality: N/A;  . RIGHT HEART CATH N/A 09/20/2017   Procedure: RIGHT HEART CATH;  Surgeon: Larey Dresser, MD;  Location: [facility];  Service: Cardiovascular;  Laterality: N/A;  . RIGHT/LEFT HEART CATH AND CORONARY ANGIOGRAPHY N/A 03/19/2018   Procedure: RIGHT/LEFT HEART CATH AND CORONARY ANGIOGRAPHY;  Surgeon: Larey Dresser, MD;  Location: [facility];  Service: Cardiovascular;  Laterality: N/A;     OB History   No obstetric history on file.     Family History  Problem Relation Age of Onset  . Ovarian cancer Mother   . Breast cancer Neg Hx   . Hyperparathyroidism Neg Hx   . Colon cancer Neg Hx    . Esophageal cancer Neg Hx   . Stomach cancer Neg Hx     Social History   Tobacco Use  . Smoking status: Former Smoker    Packs/day: 1.00    Years: 30.00    Pack years: 30.00    Types: Cigarettes    Quit date: 1989    Years since quitting: 32.3  . Smokeless tobacco: Never Used  Substance Use Topics  . Alcohol use: No  . Drug use: No    Home Medications Prior to Admission medications   Medication Sig Start Date End Date Taking? Authorizing Provider  albuterol (VENTOLIN HFA) 108 (90 Base) MCG/ACT inhaler INHALE 1-2 PUFFS INTO THE LUNGS EVERY 4 HOURS AS NEEDED FOR WHEEZING OR SHORTNESS OF BREATH. 04/30/18   Parrett, Fonnie Mu, NP  atorvastatin (LIPITOR) 20 MG tablet Take 1 tablet (20 mg total) by mouth every evening. For cholesterol 12/12/18   [name], NP  bisoprolol (ZEBETA) 5 MG tablet Take 2.5 mg by mouth daily. 01/25/19   [provider]  Blood Glucose Calibration (ACCU-CHEK AVIVA) SOLN USE AS INSTRUCTED TO TEST BLOOD SUGAR DAILY 05/28/18   [name], NP  Blood Glucose Monitoring Suppl (ACCU-CHEK AVIVA PLUS) w/Device KIT Use as instructed to test blood sugar up to 3 times daily 05/28/18   [name], NP  famotidine (PEPCID) 40 MG tablet Take 1 tablet twice 01/28/19   Tanda Rockers, MD  fluticasone furoate-vilanterol (BREO ELLIPTA) 100-25 MCG/INH AEPB Inhale 1 puff into the lungs daily.    [provider]  furosemide (LASIX) 40 MG tablet Take 40 mg by mouth daily.     [provider]  glipiZIDE (GLUCOTROL) 5 MG tablet Take 1 tablet (5 mg total) by mouth daily before breakfast. 01/07/19   [name], NP  glucose blood (ACCU-CHEK AVIVA PLUS) test strip Use as instructed to test blood sugar up to 3 times daily 05/28/18   [name], NP  KLOR-CON M10 10 MEQ tablet TAKE 2 TABLETS (20 MEQ TOTAL) BY MOUTH DAILY. FOR LOW POTASSIUM. 01/27/19   Larey Dresser, MD  Lancets (ACCU-CHEK SOFT [facility]) lancets Use as instructed to test  blood sugar up to 3 times daily 05/28/18  [name], NP  mirtazapine (REMERON) 15 MG tablet TAKE 1 TABLET BY MOUTH AT BEDTIME. FOR DEPRESSION AND APPETITE. 09/25/18   [name], NP  OXYGEN Inhale 4 L into the lungs continuous.     [provider]  polyethylene glycol powder (GLYCOLAX/MIRALAX) 17 GM/SCOOP powder Take 17 g by mouth daily as needed for mild constipation (TO BE MIXED INTO 4-8 OUNCES OF LIQUID, THEN CONSUMED).  08/13/15   [provider]  rivaroxaban (XARELTO) 10 MG TABS tablet Take 1 tablet (10 mg total) by mouth daily. For stroke prevention. 01/27/19   [name], NP  spironolactone (ALDACTONE) 25 MG tablet Take 1 tablet (25 mg total) by mouth daily. Take at night 01/30/19   Larey Dresser, MD  umeclidinium bromide (INCRUSE ELLIPTA) 62.5 MCG/INH AEPB Inhale 1 puff into the lungs daily. 11/20/18   Bary Leriche, PA-C    Allergies    Patient has no known allergies.  Review of Systems   Review of Systems  Respiratory: Positive for shortness of breath.   All other systems reviewed and are negative.   Physical Exam Updated Vital Signs BP 134/88   Pulse 79   Temp 98.2 F (36.8 C) (Oral)   Resp (!) 24   Ht 5' 4" (1.626 m)   Wt 39 kg   SpO2 96%   BMI 14.76 kg/m   Physical Exam Vitals and nursing note reviewed.  Constitutional:      General: She is in acute distress.     Appearance: She is well-developed. She is ill-appearing.     Comments: cachectic  HENT:     Head: Normocephalic and atraumatic.  Cardiovascular:     Rate and Rhythm: Normal rate and regular rhythm.     Heart sounds: No murmur.  Pulmonary:     Comments: Tachypnea. Decreased air movement in all lung fields Abdominal:     Palpations: Abdomen is soft.     Tenderness: There is no abdominal tenderness. There is no guarding or rebound.  Musculoskeletal:        General: No swelling or tenderness.  Skin:    General: Skin is warm and dry.  Neurological:      Mental Status: She is alert and oriented to person, place, and time.     Comments: Generalized weakness  Psychiatric:        Behavior: Behavior normal.     ED Results / Procedures / Treatments   Labs (all labs ordered are listed, but only abnormal results are displayed) Labs Reviewed  LACTIC ACID, PLASMA - Abnormal; Notable for the following components:      Result Value   Lactic Acid, Venous 2.8 (*)    All other components within normal limits  LACTIC ACID, PLASMA - Abnormal; Notable for the following components:   Lactic Acid, Venous 3.1 (*)    All other components within normal limits  COMPREHENSIVE METABOLIC PANEL - Abnormal; Notable for the following components:   Glucose, Bld 224 (*)    Creatinine, Ser 1.15 (*)    Calcium 11.1 (*)    GFR calc non Af Amer 47 (*)    GFR calc Af Amer 55 (*)    All other components within normal limits  CBC WITH DIFFERENTIAL/PLATELET - Abnormal; Notable for the following components:   WBC 3.9 (*)    All other components within normal limits  APTT - Abnormal; Notable for the following components:   aPTT 41 (*)    All other  components within normal limits  PROTIME-INR - Abnormal; Notable for the following components:   Prothrombin Time 22.4 (*)    INR 2.0 (*)    All other components within normal limits  URINALYSIS, ROUTINE W REFLEX MICROSCOPIC - Abnormal; Notable for the following components:   Nitrite POSITIVE (*)    Leukocytes,Ua TRACE (*)    Bacteria, UA MANY (*)    All other components within normal limits  BRAIN NATRIURETIC PEPTIDE - Abnormal; Notable for the following components:   B Natriuretic Peptide 1,774.6 (*)    All other components within normal limits  I-STAT CHEM 8, ED - Abnormal; Notable for the following components:   BUN 25 (*)    Creatinine, Ser 1.10 (*)    Glucose, Bld 204 (*)    All other components within normal limits  POCT I-STAT 7, (LYTES, BLD GAS, ICA,H+H) - Abnormal; Notable for the following components:    pCO2 arterial 48.5 (*)    pO2, Arterial 64.0 (*)    Bicarbonate 29.9 (*)    Acid-Base Excess 4.0 (*)    Calcium, Ion 1.46 (*)    All other components within normal limits  CBG MONITORING, ED - Abnormal; Notable for the following components:   Glucose-Capillary 129 (*)    All other components within normal limits  TROPONIN I (HIGH SENSITIVITY) - Abnormal; Notable for the following components:   Troponin I (High Sensitivity) 34 (*)    All other components within normal limits  CULTURE, BLOOD (ROUTINE X 2)  CULTURE, BLOOD (ROUTINE X 2)  URINE CULTURE  SARS CORONAVIRUS 2 (TAT 6-24 HRS)  MAGNESIUM  COMPREHENSIVE METABOLIC PANEL  CBC  I-STAT ARTERIAL BLOOD GAS, ED  POC SARS CORONAVIRUS 2 AG -  ED    EKG EKG Interpretation  Date/Time:  Saturday April 19 2019 12:34:52 EDT Ventricular Rate:  74 PR Interval:    QRS Duration: 98 QT Interval:  381 QTC Calculation: 423 R Axis:   -10 Text Interpretation: Sinus rhythm Atrial premature complex RSR' in V1 or V2, probably normal variant LVH with secondary repolarization abnormality Confirmed by Quintella Reichert [phone]) on 04/19/2019 12:53:47 PM   Radiology DG Chest Port 1 View  Result Date: 04/19/2019 CLINICAL DATA:  Dyspnea, hypoxia, COPD EXAM: PORTABLE CHEST 1 VIEW COMPARISON:  12/30/2018 chest radiograph. FINDINGS: Loop recorder overlies the lower left chest. Stable cardiomediastinal silhouette with normal heart size. No pneumothorax. No pleural effusion. No pulmonary edema. No acute consolidative airspace disease. Emphysema. IMPRESSION: No acute cardiopulmonary disease.  Emphysema. Electronically Signed   By: Ilona Sorrel M.D.   On: 04/19/2019 12:09    Procedures Procedures (including critical care time)  Medications Ordered in ED Medications  acetaminophen (TYLENOL) tablet 650 mg (has no administration in time range)    Or  acetaminophen (TYLENOL) suppository 650 mg (has no administration in time range)  0.9 %  sodium chloride  infusion ( Intravenous New Bag/Given 04/19/19 1659)  ondansetron (ZOFRAN) tablet 4 mg (has no administration in time range)    Or  ondansetron (ZOFRAN) injection 4 mg (has no administration in time range)  insulin aspart (novoLOG) injection 0-15 Units (2 Units Subcutaneous Given 04/19/19 1726)  insulin aspart (novoLOG) injection 0-5 Units (has no administration in time range)  ipratropium-albuterol (DUONEB) 0.5-2.5 (3) MG/3ML nebulizer solution 3 mL (3 mLs Nebulization Given 04/19/19 2036)  methylPREDNISolone sodium succinate (SOLU-MEDROL) 125 mg/2 mL injection 60 mg (60 mg Intravenous Given 04/19/19 1835)  azithromycin (ZITHROMAX) 500 mg in sodium chloride 0.9 % 250 mL  IVPB (500 mg Intravenous New Bag/Given 04/19/19 1838)  cefTRIAXone (ROCEPHIN) 1 g in sodium chloride 0.9 % 100 mL IVPB (has no administration in time range)  sodium chloride 0.9 % bolus 1,000 mL (0 mLs Intravenous Stopped 04/19/19 1450)  albuterol (VENTOLIN HFA) 108 (90 Base) MCG/ACT inhaler 4 puff (4 puffs Inhalation Given 04/19/19 1229)  methylPREDNISolone sodium succinate (SOLU-MEDROL) 125 mg/2 mL injection 125 mg (125 mg Intravenous Given 04/19/19 1229)  albuterol (VENTOLIN HFA) 108 (90 Base) MCG/ACT inhaler 2 puff (2 puffs Inhalation Given 04/19/19 1609)  cefTRIAXone (ROCEPHIN) 1 g in sodium chloride 0.9 % 100 mL IVPB (0 g Intravenous Stopped 04/19/19 1640)    ED Course  I have reviewed the triage vital signs and the nursing notes.  Pertinent labs & imaging results that were available during my care of the patient were reviewed by me and considered in my medical decision making (see chart for details).    MDM Rules/Calculators/A&P                     Patient with history of COPD on oxygen at home here for evaluation of increased shortness of breath for the last few days to few weeks. She is chronically ill appearing on evaluation with cachexia, increased work of breathing. She was treated with increased oxygen, albuterol,  Solu-Medrol. On repeat assessment she is appearing improved. Lactic acid is elevated, not due to sepsis. Lactic acidosis is related to her chronic respiratory disease. UA is concerning for UTI, will send culture and start on antibiotics. Discussed with patient findings of studies recommendation for admission and she is in agreement with treatment plan. Hospitalist consulted for admission.  Attempted to contact the patient's husband for an update, but there was no answer.  Final Clinical Impression(s) / ED Diagnoses Final diagnoses:  Acute respiratory failure with hypoxia [facility])    Rx / DC Orders ED Discharge Orders    None       Quintella Reichert, MD 04/19/19 2044

## 2019-04-19 NOTE — ED Triage Notes (Signed)
Pt presents from home for SOB x 2-3 days (though pt at times says weeks) as weel as decreased urine output with odor and dark color.   Pt wears [place] at home for O2, mouth breathing upon EMS arrival. O2 sats 94% on 6L simple mask, unable to read d/t circulation at RA. Work of breathing improved with simple mask.  EMS exam: dimished lung sounds at bases, NSSR, cool extrem, 351cbg, 99.7 oral, 130/86, 77bpm, 34RR (improved to 24RR).   PMH: bed bound from prev stroke, DM, COPD

## 2019-04-20 DIAGNOSIS — I5032 Chronic diastolic (congestive) heart failure: Secondary | ICD-10-CM

## 2019-04-20 DIAGNOSIS — A4151 Sepsis due to Escherichia coli [E. coli]: Principal | ICD-10-CM

## 2019-04-20 DIAGNOSIS — D708 Other neutropenia: Secondary | ICD-10-CM

## 2019-04-20 DIAGNOSIS — Z8673 Personal history of transient ischemic attack (TIA), and cerebral infarction without residual deficits: Secondary | ICD-10-CM

## 2019-04-20 DIAGNOSIS — E872 Acidosis: Secondary | ICD-10-CM

## 2019-04-20 DIAGNOSIS — E118 Type 2 diabetes mellitus with unspecified complications: Secondary | ICD-10-CM

## 2019-04-20 DIAGNOSIS — J431 Panlobular emphysema: Secondary | ICD-10-CM

## 2019-04-20 DIAGNOSIS — J439 Emphysema, unspecified: Secondary | ICD-10-CM | POA: Diagnosis present

## 2019-04-20 DIAGNOSIS — F329 Major depressive disorder, single episode, unspecified: Secondary | ICD-10-CM

## 2019-04-20 DIAGNOSIS — E7800 Pure hypercholesterolemia, unspecified: Secondary | ICD-10-CM

## 2019-04-20 DIAGNOSIS — I10 Essential (primary) hypertension: Secondary | ICD-10-CM

## 2019-04-20 DIAGNOSIS — F419 Anxiety disorder, unspecified: Secondary | ICD-10-CM

## 2019-04-20 LAB — CBC
HCT: 40.7 % (ref 36.0–46.0)
Hemoglobin: 12.5 g/dL (ref 12.0–15.0)
MCH: 26.7 pg (ref 26.0–34.0)
MCHC: 30.7 g/dL (ref 30.0–36.0)
MCV: 87 fL (ref 80.0–100.0)
Platelets: 192 10*3/uL (ref 150–400)
RBC: 4.68 MIL/uL (ref 3.87–5.11)
RDW: 14.2 % (ref 11.5–15.5)
WBC: 3.3 10*3/uL — ABNORMAL LOW (ref 4.0–10.5)
nRBC: 0 % (ref 0.0–0.2)

## 2019-04-20 LAB — BLOOD GAS, ARTERIAL
Acid-Base Excess: 2.8 mmol/L — ABNORMAL HIGH (ref 0.0–2.0)
Bicarbonate: 27.1 mmol/L (ref 20.0–28.0)
Drawn by: 13746
FIO2: 48
O2 Saturation: 90.5 %
Patient temperature: 37
pCO2 arterial: 43.9 mmHg (ref 32.0–48.0)
pH, Arterial: 7.408 (ref 7.350–7.450)
pO2, Arterial: 59.4 mmHg — ABNORMAL LOW (ref 83.0–108.0)

## 2019-04-20 LAB — COMPREHENSIVE METABOLIC PANEL
ALT: 22 U/L (ref 0–44)
AST: 25 U/L (ref 15–41)
Albumin: 3.7 g/dL (ref 3.5–5.0)
Alkaline Phosphatase: 84 U/L (ref 38–126)
Anion gap: 9 (ref 5–15)
BUN: 22 mg/dL (ref 8–23)
CO2: 27 mmol/L (ref 22–32)
Calcium: 10.8 mg/dL — ABNORMAL HIGH (ref 8.9–10.3)
Chloride: 108 mmol/L (ref 98–111)
Creatinine, Ser: 1.01 mg/dL — ABNORMAL HIGH (ref 0.44–1.00)
GFR calc Af Amer: >60 mL/min (ref >60)
GFR calc non Af Amer: 55 mL/min — ABNORMAL LOW (ref >60)
Glucose, Bld: 183 mg/dL — ABNORMAL HIGH (ref 70–99)
Potassium: 4.5 mmol/L (ref 3.5–5.1)
Sodium: 144 mmol/L (ref 135–145)
Total Bilirubin: 0.8 mg/dL (ref 0.3–1.2)
Total Protein: 6.4 g/dL — ABNORMAL LOW (ref 6.5–8.1)

## 2019-04-20 LAB — GLUCOSE, CAPILLARY
Glucose-Capillary: 154 mg/dL — ABNORMAL HIGH (ref 70–99)
Glucose-Capillary: 126 mg/dL — ABNORMAL HIGH (ref 70–99)
Glucose-Capillary: 135 mg/dL — ABNORMAL HIGH (ref 70–99)
Glucose-Capillary: 165 mg/dL — ABNORMAL HIGH (ref 70–99)

## 2019-04-20 LAB — TROPONIN I (HIGH SENSITIVITY): Troponin I (High Sensitivity): 39 ng/L — ABNORMAL HIGH (ref <18)

## 2019-04-20 LAB — D-DIMER, QUANTITATIVE: D-Dimer, Quant: 0.47 ug/mL-FEU (ref 0.00–0.50)

## 2019-04-20 MED ORDER — DEXTROSE-NACL 5-0.9 % IV SOLN: INTRAVENOUS | Status: DC

## 2019-04-20 MED ORDER — ENOXAPARIN SODIUM 300 MG/3ML IJ SOLN
20.0000 mg | INTRAMUSCULAR | Status: DC
  Administered 2019-04-20 – 2019-04-22 (×3): 20 mg via SUBCUTANEOUS
  Filled 2019-04-20 (×4): qty 0.2

## 2019-04-20 MED ORDER — IPRATROPIUM-ALBUTEROL 0.5-2.5 (3) MG/3ML IN SOLN
3.0000 mL | Freq: Four times a day (QID) | RESPIRATORY_TRACT | Status: DC
  Administered 2019-04-20 – 2019-04-21 (×5): 3 mL via RESPIRATORY_TRACT
  Filled 2019-04-20 (×5): qty 3

## 2019-04-20 NOTE — Progress Notes (Signed)
MEW YELLOW  Vital Signs at 1640:  Temp 97.6 BP 145/83 HR 86  RR 28  O2 91% on 8L HFNC  Pt dyspneic with exertion. No acute change in pt's baseline. RN will continue to monitor pt. Speech eval just completed. Pt SOB from it.          Kelli Hope Saben Donigan 04/20/2019,4:57 PM

## 2019-04-20 NOTE — Progress Notes (Signed)
Pt reported increased air hunger with visible increased anxiety. RN assessed pt's pusle ox level. She was 89% when RN increased pt to 9L HFNC. Pt using more accessory muscles with breathing at rest.   RN spoke with speech therapist who recommended no ice chips for pt at this time as pt's symptoms worsened once she started eating some. RN educated pt and family and modification back to strict NPO. They verbalized understanding.   RN reported findings to respiratory therapist. He recommended increasing pt to 12L HFNC, and that if pt did not have decrease in symptoms to increase to 15L HFNC.   RN reported findings and interventions to [facility] MD. RN will continue to monitor pt.

## 2019-04-20 NOTE — Progress Notes (Signed)
PROGRESS NOTE    Denise [name]  YJE:[id number] DOB: 04/21/1945 DOA: 04/19/2019 PCP: [name], NP     Brief Narrative:  74 y.o. BF PMHx essential HTN, HLD, DM type II controlled with complication, Chronic Respiratory Failure with hypoxia on 5 L O2 at home, COPD/Severe Emphysema, pulmonary HTN, Chronic Diastolic CHF, ischemic stroke s/p TPA-currently on Xarelto, protein calorie malnutrition,   Presents to emergency department due to worsening shortness of breath since couple of days.  Patient tells me that she has worsening shortness of breath since couple of days.  Has been feeling unwell.  She denies association with wheezing, cough, congestion, sputum production, fever, chills, leg swelling, orthopnea, PND, chest pain, headache, blurry vision, abdominal pain, decreased appetite or bowel changes.  Reports increased urinary frequency and strong smell since couple of days and associated with lower abdominal pain.  She denies nausea, vomiting, dysuria, hematuria.  She lives with her husband at home.  No history of smoking, alcohol, illicit drug use.  She has home health coming out to the house several days a week.  Upon arrival of EMS: She noted to have hypoxia in 60s she placed on oxygen mask by EMS.  ED Course: Upon arrival to ED: Patient tachypneic, afebrile with WBC of 3.9, lactic acid 2.8 trended up to 3.1, troponin: 34, UA positive for nitrates and leukocytes, POC COVID-19 negative.  She is requiring 6 L of oxygen via nasal cannula in ED.  She received loading dose of IV Solu-Medrol, IV fluid bolus and albuterol breathing treatment in ED.  She also received IV Rocephin for UTI.  [facility] consulted for admission for acute on chronic hypoxemic respiratory failure.    Subjective: A/O x1 (does not know where, when, why).  States lives at home with her husband.  Believes she was sent here from her doctor's office.   Assessment & Plan:   Active Problems:  COPD (chronic obstructive pulmonary disease) ([name])   Anxiety and depression   Hyperlipidemia   Essential hypertension   Pulmonary hypertension ([name]) c/w cor pulmonale   Type 2 diabetes mellitus ([name])   Elevated troponin   Lactic acidosis   GERD (gastroesophageal reflux disease)   Acute on chronic respiratory failure with hypercapnia ([name])   History of ischemic stroke   Acute on chronic respiratory failure with hypoxia ([name])   Chronic diastolic CHF (congestive heart failure) ([name])   UTI (urinary tract infection)   Leukopenia   CKD (chronic kidney disease), stage III   Acute on chronic respiratory failure with hypoxemia ([name])   Emphysema lung ([name])  Acute on chronic respiratory failure with hypoxia/COPD exacerbation -Setting of most likely end-stage emphysema/COPD. -Solu-Medrol 60 mg  BID -DuoNeb QID -Continue 7-day course antibiotics -Titrate O2 to maintain SPO2> 88% -D-dimer pending if positive will obtain CTA PE protocol -4/19 PCXR pending  Sepsis -On admission patient meets criteria for sepsis.  WBC<4K, RR> 20, site of infection lungs/renal. -D5-0.9% saline 132m/hr -See respiratory failure  UTI positive E. coli -Will be covered by antibiotics for COPD exacerbation  Lactic acidosis: -Multifactorial sepsis, dehydration. -See sepsis  Chronic diastolic CHF -Strict ins and out -Daily weight -4/18 echocardiogram pending -Hold diuretics  Essential HTN -see CHF  Pacer ?  Elevated troponin - first troponin 34. -Negative CP -Trend troponin -EKG no ST-T wave changes noted.  DM type II controlled with complication -Moderate SSI  Dementia/Hx ischemic stroke -Patient A/O x1 unsure of baseline -May need to perform neurologic work-up  Severe protein calorie malnutrition/failure  to thrive -N.p.o. -4/18 failed swallow evaluation  HLD -Statin on hold  CKD stage IIIa (baseline Cr ~1.21) Recent Labs  Lab 04/19/19 1215 04/19/19 1217 04/20/19 0248   CREATININE 1.10* 1.15* 1.01*  -Better than baseline with hydration  Leukopenia   Goals of care -4/18 Palliative Care Consult; patient with dementia, severe emphysema/COPD severe protein calorie malnutrition.  Evaluate for change to DNR status, palliative care services at home when ready for discharge -Unless patient has significant improvement significant risk of not surviving this hospitalization.  DVT prophylaxis: Lovenox Code Status: Full Family Communication:  Disposition Plan: TBD 1.  Where the patient is from 2.  Anticipated d/c place. 3.  Barriers to d/c await palliative care recommendations   Consultants:    Procedures/Significant Events:    I have personally reviewed and interpreted all radiology studies and my findings are as above.  VENTILATOR SETTINGS: HFNC 4/18 Flow; 7 L/min SPO2 93%   Cultures 4/17 urine positive E. coli 4/17 blood LEFT forearm NGTD 4/17 blood RIGHT wrist NGTD   Antimicrobials: Anti-infectives (From admission, onward)   Start     Dose/Rate Stop   04/20/19 1600  cefTRIAXone (ROCEPHIN) 1 g in sodium chloride 0.9 % 100 mL IVPB     1 g 200 mL/hr over 30 Minutes     04/19/19 1730  azithromycin (ZITHROMAX) 500 mg in sodium chloride 0.9 % 250 mL IVPB     500 mg 250 mL/hr over 60 Minutes     04/19/19 1600  cefTRIAXone (ROCEPHIN) 1 g in sodium chloride 0.9 % 100 mL IVPB     1 g 200 mL/hr over 30 Minutes 04/19/19 1640       Devices    LINES / TUBES:      Continuous Infusions: . sodium chloride 100 mL/hr at 04/20/19 0339  . azithromycin Stopped (04/20/19 0249)  . cefTRIAXone (ROCEPHIN)  IV       Objective: Vitals:   04/19/19 2048 04/20/19 0019 04/20/19 0556 04/20/19 0752  BP:  108/73 (!) 143/89 128/78  Pulse:  70 71   Resp: _0 Temp:  98.1 F (36.7 C) 98 F (36.7 C) 98.2 F (36.8 C)  TempSrc:  Oral Oral Oral  SpO2:  98% 97%   Weight:      Height:        Intake/Output Summary (Last 24 hours) at  04/20/2019 0826 Last data filed at 04/19/2019 1640 Gross per 24 hour  Intake 1100 ml  Output --  Net 1100 ml   Filed Weights   04/19/19 1633  Weight: 39 kg    Examination:  General: A/O x1 (does not know where, when, why) positive acute on chronic Respiratory distress, cachectic Eyes: negative scleral hemorrhage, negative anisocoria, negative icterus ENT: Negative Runny nose, negative gingival bleeding, Neck:  Negative scars, masses, torticollis, lymphadenopathy, JVD Lungs: Tachypneic, diffuse decreased breath sounds, without wheezes or crackles Cardiovascular: Tachycardic, without murmur gallop or rub normal S1 and S2 Abdomen: negative abdominal pain, nondistended, positive soft, bowel sounds, no rebound, no ascites, no appreciable mass Extremities: No significant cyanosis, clubbing, or edema bilateral lower extremities Skin: Negative rashes, lesions, ulcers Psychiatric:  Negative depression, negative anxiety, negative fatigue, negative mania  Central nervous system:  Cranial nerves II through XII intact, tongue/uvula midline, all extremities muscle strength 5/5, sensation intact throughout, negative dysarthria, negative expressive aphasia, negative receptive aphasia.  .     Data Reviewed: Care during the described time interval was provided by me .  I  have reviewed this patient's available data, including medical history, events of note, physical examination, and all test results as part of my evaluation.  CBC: Recent Labs  Lab 04/19/19 1215 04/19/19 1217 04/19/19 1251 04/20/19 0248  WBC  --  3.9*  --  3.3*  NEUTROABS  --  2.8  --   --   HGB 14.6 13.2 12.2 12.5  HCT 43.0 43.9 36.0 40.7  MCV  --  88.5  --  87.0  PLT  --  175  --  366   Basic Metabolic Panel: Recent Labs  Lab 04/19/19 1215 04/19/19 1217 04/19/19 1251 04/19/19 1454 04/20/19 0248  NA 142 143 141  --  144  K 4.2 5.0 3.8  --  4.5  CL 103 103  --   --  108  CO2  --  30  --   --  27  GLUCOSE 204*  224*  --   --  183*  BUN 25* 22  --   --  22  CREATININE 1.10* 1.15*  --   --  1.01*  CALCIUM  --  11.1*  --   --  10.8*  MG  --   --   --  1.8  --    GFR: Estimated Creatinine Clearance: 30.5 mL/min (A) (by C-G formula based on SCr of 1.01 mg/dL (H)). Liver Function Tests: Recent Labs  Lab 04/19/19 1217 04/20/19 0248  AST 35 25  ALT 23 22  ALKPHOS 96 84  BILITOT 1.0 0.8  PROT 6.8 6.4*  ALBUMIN 3.9 3.7   No results for input(s): LIPASE, AMYLASE in the last 168 hours. No results for input(s): AMMONIA in the last 168 hours. Coagulation Profile: Recent Labs  Lab 04/19/19 1217  INR 2.0*   Cardiac Enzymes: No results for input(s): CKTOTAL, CKMB, CKMBINDEX, TROPONINI in the last 168 hours. BNP (last 3 results) Recent Labs    05/02/18 1249 06/24/18 1111  PROBNP 910.0* 840.0*   HbA1C: No results for input(s): HGBA1C in the last 72 hours. CBG: Recent Labs  Lab 04/19/19 1653 04/19/19 2141 04/20/19 0750  GLUCAP 129* 187* 154*   Lipid Profile: No results for input(s): CHOL, HDL, LDLCALC, TRIG, CHOLHDL, LDLDIRECT in the last 72 hours. Thyroid Function Tests: No results for input(s): TSH, T4TOTAL, FREET4, T3FREE, THYROIDAB in the last 72 hours. Anemia Panel: No results for input(s): VITAMINB12, FOLATE, FERRITIN, TIBC, IRON, RETICCTPCT in the last 72 hours. Sepsis Labs: Recent Labs  Lab 04/19/19 1217 04/19/19 1449  LATICACIDVEN 2.8* 3.1*    Recent Results (from the past 240 hour(s))  Blood Culture (routine x 2)     Status: None (Preliminary result)   Collection Time: 04/19/19 11:57 AM   Specimen: BLOOD LEFT FOREARM  Result Value Ref Range Status   Specimen Description BLOOD LEFT FOREARM  Final   Special Requests   Final    BOTTLES DRAWN AEROBIC AND ANAEROBIC Blood Culture results may not be optimal due to an inadequate volume of blood received in culture bottles   Culture   Final    NO GROWTH < 24 HOURS Performed at [facility], [place] [address],  [place], [place] [address]    Report Status PENDING  Incomplete  Blood Culture (routine x 2)     Status: None (Preliminary result)   Collection Time: 04/19/19 11:58 AM   Specimen: BLOOD RIGHT WRIST  Result Value Ref Range Status   Specimen Description BLOOD RIGHT WRIST  Final   Special Requests  Final    BOTTLES DRAWN AEROBIC ONLY Blood Culture results may not be optimal due to an inadequate volume of blood received in culture bottles   Culture   Final    NO GROWTH < 24 HOURS Performed at [facility] [address], [place], [place] [address]    Report Status PENDING  Incomplete         Radiology Studies: DG Chest Port 1 View  Result Date: 04/19/2019 CLINICAL DATA:  Dyspnea, hypoxia, COPD EXAM: PORTABLE CHEST 1 VIEW COMPARISON:  12/30/2018 chest radiograph. FINDINGS: Loop recorder overlies the lower left chest. Stable cardiomediastinal silhouette with normal heart size. No pneumothorax. No pleural effusion. No pulmonary edema. No acute consolidative airspace disease. Emphysema. IMPRESSION: No acute cardiopulmonary disease.  Emphysema. Electronically Signed   By: Ilona Sorrel M.D.   On: 04/19/2019 12:09        Scheduled Meds: . insulin aspart  0-15 Units Subcutaneous TID WC  . insulin aspart  0-5 Units Subcutaneous QHS  . ipratropium-albuterol  3 mL Nebulization BID  . methylPREDNISolone (SOLU-MEDROL) injection  60 mg Intravenous Q12H   Continuous Infusions: . sodium chloride 100 mL/hr at 04/20/19 0339  . azithromycin Stopped (04/20/19 0249)  . cefTRIAXone (ROCEPHIN)  IV       LOS: 1 day    Time spent:40 min    Alyxis Grippi, Geraldo Docker, MD [facility] Pager [phone]  If 7PM-7AM, please contact night-coverage [email or web address] Password [facility] 04/20/2019, 8:26 AM

## 2019-04-20 NOTE — Evaluation (Signed)
Clinical/Bedside Swallow Evaluation Patient Details  Name: Denise [name] MRN: [id number] Date of Birth: 07-21-1945  Today's Date: 04/20/2019 Time: SLP Start Time (ACUTE ONLY): 1555 SLP Stop Time (ACUTE ONLY): 1617 SLP Time Calculation (min) (ACUTE ONLY): 22 min  Past Medical History:  Past Medical History:  Diagnosis Date  . Acute blood loss anemia   . Acute ischemic right MCA stroke ([place]) 11/27/2018  . Acute on chronic diastolic CHF (congestive heart failure) ([place]) 12/30/2018  . Acute on chronic respiratory failure with hypercapnia ([place]) 10/28/2018  . Acute on chronic respiratory failure with hypoxia ([place]) 12/30/2018  . Altered mental status   . Anxiety and depression   . Borderline abnormal TFTs 05/02/2018  . Cerebellar stroke, acute ([place]) 11/08/2018  . CHF (congestive heart failure) ([place]) 05/16/2018  . Chronic respiratory failure with hypoxia ([place]) 10/02/2017   Assoc with ? Cor pulmonale dx 09/2017 so rx 02 1-2 lpm 24/7  - 10/17/2017  Walked RA x 3 laps @ 185 ft each stopped due to  End of study, nl pace,  desat to 87 on 3rd lap - 12/19/2017  Saturations on Room Air at Rest =91 % and while Ambulating = 87%  But  on  2 Liters of pulsed oxygen while Ambulating =93% so rec POC 2lpm walking / use at rest if sats under 90%      . Chronic respiratory failure with hypoxia and hypercapnia ([place]) 05/03/2018   Assoc with   Cor pulmonale dx 09/2017 so rx 02 1-2 lpm 24/7  - 10/17/2017  Walked RA x 3 laps @ 185 ft each stopped due to  End of study, nl pace,  desat to 87 on 3rd lap - 12/19/2017  Saturations on Room Air at Rest =91 % and while Ambulating = 87%  But  on  2 Liters of pulsed oxygen while Ambulating =93% so rec POC 2lpm walking / use at rest if sats under 90%  - HC03 05/02/2018  = 34  - HC03 11/16  . Colon cancer ([place]) 1988   Resected  . Constipation   . COPD (chronic obstructive pulmonary disease) ([place])   . COPD GOLD  II  spirometry, severe Emphysema and 02 dep 11/22/2015   Quit smoking  1989 - Spirometry 09/11/2016  FEV1 1.03 (52%)  Ratio 54 mild curvature, on no rx - 09/11/2016  After extensive coaching HFA effectiveness =    50% from baseline 10% > continue  symb 160 2bid but consider change to bevespi or stiolto  - 10/02/2017  After extensive coaching inhaler device,  effectiveness =    50% from a baseline near 0 > rechallenge with symbicort 160 x 2 bid x 2 weeks the  . COPD with acute exacerbation ([place]) 10/28/2018  . Diabetes mellitus without complication ([name])    diet controlled- no meds. per pt  . Diarrhea 04/02/2018  . Diverticulosis   . Elevated troponin 10/28/2018  . Essential hypertension 11/22/2015  . GERD (gastroesophageal reflux disease) 10/28/2018  . Hematuria 09/27/2017  . History of colon cancer 06/11/1986   Resected 1988, [place]  . Hx of colonic polyps 02/10/2013  . Hyperlipidemia   . Hyperparathyroidism, primary ([place]) 07/27/2016  . Hypertension   . Hypertensive urgency 11/08/2018  . Hypoxia 10/30/2018  . ICH (intracerebral hemorrhage) ([place]) s/p tPA administration 11/08/2018  . Insomnia   . Lactic acidosis 10/28/2018  . Loss of appetite 06/02/2016  . Lower extremity edema 02/20/2018  . On home oxygen therapy 07/12/2018  . Primary hyperparathyroidism (  [place])   . Protein-calorie malnutrition, severe 09/17/2017  . Pulmonary hypertension ([name]) c/w cor pulmonale    See [facility] 09/20/17  With  low CO ? Accuracy of PVR  - tyvaso d/c'd 10/03/18 by [facility] due to cough, improved     . SBO (small bowel obstruction) ([place])   . Stroke ([place]) 10/2018   tpa administered  . Supplemental oxygen dependent   . Tubular adenoma of rectum 02/23/2013   low grade  . Type 2 diabetes mellitus ([place]) 04/26/2018  . Unintended weight loss 04/26/2018  . UTI (urinary tract infection) 03/05/2019  . Vaginal candidiasis   . Vitamin D deficiency    Past Surgical History:  Past Surgical History:  Procedure Laterality Date  . ABDOMINAL HYSTERECTOMY  08/2015  . BREAST BIOPSY Left   . BREAST  EXCISIONAL BIOPSY Right   . COLON RESECTION  1980's  . COLONOSCOPY    . ENDOMETRIAL BIOPSY  2009   negative  . INCONTINENCE SURGERY  2017  . LOOP RECORDER INSERTION N/A 11/08/2018   Procedure: LOOP RECORDER INSERTION;  Surgeon: Constance Haw, MD;  Location: [facility];  Service: Cardiovascular;  Laterality: N/A;  . RIGHT HEART CATH N/A 09/20/2017   Procedure: RIGHT HEART CATH;  Surgeon: Larey Dresser, MD;  Location: [facility];  Service: Cardiovascular;  Laterality: N/A;  . RIGHT/LEFT HEART CATH AND CORONARY ANGIOGRAPHY N/A 03/19/2018   Procedure: RIGHT/LEFT HEART CATH AND CORONARY ANGIOGRAPHY;  Surgeon: Larey Dresser, MD;  Location: [facility];  Service: Cardiovascular;  Laterality: N/A;   HPI:  Pt is a 74 y.o. female with medical history significant of hypertension, hyperlipidemia, type 2 diabetes mellitus, chronic hypoxemic respiratory failure due to underlying COPD-on  5 L of oxygen at home, pulmonary hypertension, ischemic stroke s/p TPA-currently on Xarelto, protein calorie malnutrition, grade 1 diastolic congestive heart failure, who presented to emergency department due to worsening shortness of breath. CXR was negative for acute cardiopulmonary disease. MBS 11/29/18: decreased bolus organization, premature spillage of thin liquids with trace aspiration before laryngeal-vestibular closure. Nectars were noted to penetrate the larynx, but were not aspirated.  There was minimal residue post-swallow.  Aspiration was accompanied by a mild cough.   Assessment / Plan / Recommendation Clinical Impression  Pt was seen for bedside swallow evaluation. She reported that she has difficulty swallowing liquids which she attributed to her having a dry mouth. [name] arrived towards the end of the evaluation and he reported that the pt tolerates solids without overt s/sx of aspiration but stated that she coughs/chokes with thin liquids. Pt tolerated solids without  overt s/sx of aspiration. Mastication was prolonged and increased work of breathing was noted. She exhibited throat clearing and coughing with thin liquids suggesting possible aspiration. Pt denied dyspnea but respiratory rate was elevated to low-mid 30s following trials of liquids suggesting potential difficulty adequately coordinating respiration with swallowing and placing her at an increased risk of aspiration. It is recommended that the pt's NPO status be maintained at this time but critical meds may be given crushed in 1/2 tsp boluses of applesauce if her respiratory rate is WNL. A modified barium swallow study is recommended to further assess swallow function.  SLP Visit Diagnosis: Dysphagia, oropharyngeal phase (R13.12)    Aspiration Risk  Mild aspiration risk;Moderate aspiration risk    Diet Recommendation NPO except meds   Medication Administration: Crushed with puree Compensations: Other (Comment)(1/2 tsp boluses of puree for meds) Postural Changes: Seated upright at  90 degrees    Other  Recommendations Oral Care Recommendations: Oral care QID;Staff/trained caregiver to provide oral care   Follow up Recommendations Other (comment)(TBD)      Frequency and Duration min 2x/week  2 weeks       Prognosis Prognosis for Safe Diet Advancement: Fair Barriers to Reach Goals: Time post onset;Severity of deficits;Cognitive deficits      Swallow Study   General Date of Onset: 04/19/19 HPI: Pt is a 74 y.o. female with medical history significant of hypertension, hyperlipidemia, type 2 diabetes mellitus, chronic hypoxemic respiratory failure due to underlying COPD-on  5 L of oxygen at home, pulmonary hypertension, ischemic stroke s/p TPA-currently on Xarelto, protein calorie malnutrition, grade 1 diastolic congestive heart failure, who presented to emergency department due to worsening shortness of breath. CXR was negative for acute cardiopulmonary disease. MBS 11/29/18: decreased bolus  organization, premature spillage of thin liquids with trace aspiration before laryngeal-vestibular closure. Nectars were noted to penetrate the larynx, but were not aspirated.  There was minimal residue post-swallow.  Aspiration was accompanied by a mild cough. Type of Study: Bedside Swallow Evaluation Previous Swallow Assessment: See HPI Diet Prior to this Study: NPO(Pt reported that she was on a puree diet prior to admission) Temperature Spikes Noted: No Respiratory Status: Nasal cannula History of Recent Intubation: No Behavior/Cognition: Alert;Cooperative;Pleasant mood Oral Cavity - Dentition: Dentures, top;Dentures, bottom Vision: Functional for self-feeding Self-Feeding Abilities: Able to feed self Patient Positioning: Upright in bed;Postural control adequate for testing Baseline Vocal Quality: Other (comment)(strained vocal quality) Volitional Cough: Weak Volitional Swallow: Able to elicit    Oral/Motor/Sensory Function Overall Oral Motor/Sensory Function: Within functional limits(with some difficulty following commands)   Ice Chips Ice chips: Impaired Presentation: Spoon Pharyngeal Phase Impairments: Throat Clearing - Immediate   Thin Liquid Thin Liquid: Impaired Presentation: Cup;Straw Pharyngeal  Phase Impairments: Change in Vital Signs;Throat Clearing - Immediate    Nectar Thick Nectar Thick Liquid: Impaired Presentation: Cup Pharyngeal Phase Impairments: Change in Vital Signs   Honey Thick Honey Thick Liquid: Not tested   Puree Puree: Within functional limits Presentation: Spoon   Solid     Solid: Impaired Presentation: Self Fed Oral Phase Impairments: Impaired mastication     Whitney Hillegass I. Hardin Negus, [place], [place] Office number [phone] Pager [phone]  Horton Marshall 04/20/2019,5:29 PM

## 2019-04-21 ENCOUNTER — Telehealth: Payer: Self-pay

## 2019-04-21 ENCOUNTER — Inpatient Hospital Stay (HOSPITAL_COMMUNITY): Payer: Medicare HMO

## 2019-04-21 DIAGNOSIS — Z515 Encounter for palliative care: Secondary | ICD-10-CM

## 2019-04-21 DIAGNOSIS — Z66 Do not resuscitate: Secondary | ICD-10-CM

## 2019-04-21 DIAGNOSIS — J9601 Acute respiratory failure with hypoxia: Secondary | ICD-10-CM

## 2019-04-21 DIAGNOSIS — J439 Emphysema, unspecified: Secondary | ICD-10-CM

## 2019-04-21 LAB — COMPREHENSIVE METABOLIC PANEL
ALT: 22 U/L (ref 0–44)
AST: 21 U/L (ref 15–41)
Albumin: 3.6 g/dL (ref 3.5–5.0)
Alkaline Phosphatase: 69 U/L (ref 38–126)
Anion gap: 8 (ref 5–15)
BUN: 23 mg/dL (ref 8–23)
CO2: 26 mmol/L (ref 22–32)
Calcium: 10.8 mg/dL — ABNORMAL HIGH (ref 8.9–10.3)
Chloride: 111 mmol/L (ref 98–111)
Creatinine, Ser: 0.85 mg/dL (ref 0.44–1.00)
GFR calc Af Amer: >60 mL/min (ref >60)
GFR calc non Af Amer: >60 mL/min (ref >60)
Glucose, Bld: 240 mg/dL — ABNORMAL HIGH (ref 70–99)
Potassium: 4.4 mmol/L (ref 3.5–5.1)
Sodium: 145 mmol/L (ref 135–145)
Total Bilirubin: 0.7 mg/dL (ref 0.3–1.2)
Total Protein: 5.8 g/dL — ABNORMAL LOW (ref 6.5–8.1)

## 2019-04-21 LAB — PHOSPHORUS: Phosphorus: 3.3 mg/dL (ref 2.5–4.6)

## 2019-04-21 LAB — BLOOD GAS, ARTERIAL
Acid-Base Excess: 2 mmol/L (ref 0.0–2.0)
Acid-Base Excess: 1.9 mmol/L (ref 0.0–2.0)
Bicarbonate: 27.1 mmol/L (ref 20.0–28.0)
Bicarbonate: 26.5 mmol/L (ref 20.0–28.0)
Drawn by: 519031
FIO2: 100
FIO2: 80
O2 Saturation: 37.7 %
O2 Saturation: 93.6 %
Patient temperature: 36.6
Patient temperature: 37
pCO2 arterial: 50.1 mmHg — ABNORMAL HIGH (ref 32.0–48.0)
pCO2 arterial: 45.3 mmHg (ref 32.0–48.0)
pH, Arterial: 7.35 (ref 7.350–7.450)
pH, Arterial: 7.385 (ref 7.350–7.450)
pO2, Arterial: <31 mmHg — CL (ref 83.0–108.0)
pO2, Arterial: 69.8 mmHg — ABNORMAL LOW (ref 83.0–108.0)

## 2019-04-21 LAB — CBC
HCT: 35.1 % — ABNORMAL LOW (ref 36.0–46.0)
Hemoglobin: 10.9 g/dL — ABNORMAL LOW (ref 12.0–15.0)
MCH: 27 pg (ref 26.0–34.0)
MCHC: 31.1 g/dL (ref 30.0–36.0)
MCV: 87.1 fL (ref 80.0–100.0)
Platelets: 162 10*3/uL (ref 150–400)
RBC: 4.03 MIL/uL (ref 3.87–5.11)
RDW: 14.4 % (ref 11.5–15.5)
WBC: 4.5 10*3/uL (ref 4.0–10.5)
nRBC: 0 % (ref 0.0–0.2)

## 2019-04-21 LAB — TROPONIN I (HIGH SENSITIVITY)
Troponin I (High Sensitivity): 40 ng/L — ABNORMAL HIGH (ref <18)
Troponin I (High Sensitivity): 30 ng/L — ABNORMAL HIGH (ref <18)

## 2019-04-21 LAB — GLUCOSE, CAPILLARY
Glucose-Capillary: 152 mg/dL — ABNORMAL HIGH (ref 70–99)
Glucose-Capillary: 133 mg/dL — ABNORMAL HIGH (ref 70–99)
Glucose-Capillary: 161 mg/dL — ABNORMAL HIGH (ref 70–99)
Glucose-Capillary: 161 mg/dL — ABNORMAL HIGH (ref 70–99)

## 2019-04-21 LAB — MAGNESIUM: Magnesium: 1.7 mg/dL (ref 1.7–2.4)

## 2019-04-21 LAB — URINE CULTURE: Culture: >=100000 — AB

## 2019-04-21 MED ORDER — HYDRALAZINE HCL 20 MG/ML IJ SOLN
5.0000 mg | Freq: Four times a day (QID) | INTRAMUSCULAR | Status: DC | PRN
  Administered 2019-04-21: 5 mg via INTRAVENOUS
  Filled 2019-04-21: qty 1

## 2019-04-21 MED ORDER — METOPROLOL TARTRATE 5 MG/5ML IV SOLN
5.0000 mg | Freq: Four times a day (QID) | INTRAVENOUS | Status: DC
  Administered 2019-04-21 – 2019-04-23 (×8): 5 mg via INTRAVENOUS
  Filled 2019-04-21 (×9): qty 5

## 2019-04-21 MED ORDER — HYDRALAZINE HCL 20 MG/ML IJ SOLN
7.0000 mg | INTRAMUSCULAR | Status: DC
  Administered 2019-04-21 – 2019-04-23 (×7): 7 mg via INTRAVENOUS
  Filled 2019-04-21 (×8): qty 1

## 2019-04-21 MED ORDER — LORAZEPAM 2 MG/ML IJ SOLN
INTRAMUSCULAR | Status: AC
  Filled 2019-04-21: qty 1

## 2019-04-21 MED ORDER — RESOURCE THICKENUP CLEAR PO POWD
ORAL | Status: DC | PRN
  Filled 2019-04-21: qty 125

## 2019-04-21 MED ORDER — HYDRALAZINE HCL 20 MG/ML IJ SOLN
5.0000 mg | INTRAMUSCULAR | Status: DC
  Administered 2019-04-21: 5 mg via INTRAVENOUS
  Filled 2019-04-21: qty 1

## 2019-04-21 MED ORDER — LORAZEPAM 2 MG/ML IJ SOLN
0.5000 mg | INTRAMUSCULAR | Status: DC | PRN
  Administered 2019-04-21 – 2019-04-22 (×2): 0.5 mg via INTRAVENOUS
  Filled 2019-04-21 (×2): qty 1

## 2019-04-21 MED ORDER — IPRATROPIUM-ALBUTEROL 0.5-2.5 (3) MG/3ML IN SOLN
3.0000 mL | Freq: Two times a day (BID) | RESPIRATORY_TRACT | Status: DC
  Administered 2019-04-22 – 2019-04-23 (×3): 3 mL via RESPIRATORY_TRACT
  Filled 2019-04-21 (×4): qty 3

## 2019-04-21 MED ORDER — MORPHINE SULFATE (PF) 2 MG/ML IV SOLN
1.0000 mg | INTRAVENOUS | Status: DC | PRN
  Administered 2019-04-21 – 2019-04-22 (×4): 2 mg via INTRAVENOUS
  Filled 2019-04-21 (×5): qty 1

## 2019-04-21 NOTE — Telephone Encounter (Signed)
Yes, I saw her admission H&P. PCP will follow

## 2019-04-21 NOTE — Progress Notes (Signed)
0510 Pt sating 97 on HFNC and nonrebreather. Removed HFNC and left pt on nonrebreather. 0539[name] resps even and unlabored and sating at 95%.

## 2019-04-21 NOTE — Consult Note (Signed)
Consultation Note Date: 04/21/2019   Patient Name: Denise [name]  DOB: 05-05-1945  MRN: [id number]  Age / Sex: 74 y.o., female  PCP: [name], NP Referring Physician: [name], MD  Reason for Consultation: Establishing goals of care and Psychosocial/spiritual support  HPI/Patient Profile: 74 y.o. female  admitted on 04/19/2019 with significant past medical history for hypertension, hyperlipidemia, type 2 diabetes mellitus, chronic hypoxic aspiratory failure secondary to underlying COPD/on 5 L of oxygen at home, pulmonary hypertension, CVA, congestive heart failure who presented to the emergency room/ via EMS with worsening shortness of breath over the last several days.  Patient lives at home with her husband who is her main caregiver.  Husband reports that it is becoming more difficult to care for her at home he has minimal support.  She is had 6 hospitalizations in the last 6 months.  She is high risk for on going decompensation.  According to epic notes there have been several rapid responses called regarding respiratory distress and shortness of breath  Patient has tested Covid-19 negative.  Currently she was requiring high concentration of oxygen/10 liters [place] to maintain O2 saturations.  Currently patient is alert and in no acute distress.  She does tell her husband multiple times that she wants to "go home".  Patient and family face treatment option decisions, advanced directive decisions and anticipatory care needs.  Clinical Assessment and Goals of Care:   This NP Wadie Lessen reviewed medical records, received report from team, assessed the patient and then meet at the patient's bedside along with her husband and main caregiver in the home  to discuss diagnosis, prognosis, GOC, EOL wishes disposition and options.  Education offered regarding Concept of Hospice and Palliative  Care.  Husband and patient are open to hospice services at home on discharge, however for now there are hoping to treat the treatable, remain hopeful for improvement  Education was offered today regarding advanced directives.  Concepts specific to code status, artifical feeding and hydration, continued IV antibiotics and rehospitalization were discussed.  The difference between an aggressive medical intervention path  and a palliative comfort care path for this patient at this time was had.  Values and goals of care important to patient and family were attempted to be elicited.  Created space and opportunity for husband to explore his thoughts and feelings regarding his wife's current medical situation.  Education offered on the natural trajectory and expectations of end-stage lung disease.  We discussed again her high risk for decompensation.   Hard choices booklet left for review   Questions and concerns addressed.   Family encouraged to call with questions or concerns.    PMT will continue to support holistically.    No documented healthcare power of attorney or advanced directive.  Patient's husband acts as her main support and Media planner.    SUMMARY OF RECOMMENDATIONS   -DNR/DNI -Continue to treat the treatable, husband remains hopeful for improvement -Remeet tomorrow at 83 for continued conversation regarding current medical situation  Code Status/Advance Care Planning:  DNR-documented today   Palliative Prophylaxis:   Aspiration, Bowel Regimen, Delirium Protocol, Frequent Pain Assessment and Oral Care  Additional Recommendations (Limitations, Scope, Preferences):  Full Scope Treatment  Psycho-social/Spiritual:   Desire for further Chaplaincy support:yes  Additional Recommendations: Education on Hospice  Prognosis:   Prognosis is poor, high risk for decompensation  Discharge Planning: To Be Determined      Primary Diagnoses: Present on Admission: . UTI  (urinary tract infection) . Lactic acidosis . Hyperlipidemia . GERD (gastroesophageal reflux disease) . Essential hypertension . Elevated troponin . Acute on chronic respiratory failure with hypoxia ([place]) . COPD (chronic obstructive pulmonary disease) ([place]) . Acute on chronic respiratory failure with hypercapnia ([place]) . Leukopenia . CKD (chronic kidney disease), stage III . Chronic diastolic CHF (congestive heart failure) ([place]) . Acute on chronic respiratory failure with hypoxemia ([place]) . Pulmonary hypertension ([name]) c/w cor pulmonale . Anxiety and depression . Emphysema lung ([place]) . Sepsis due to Escherichia coli (E. coli) ([place])   I have reviewed the medical record, interviewed the patient and family, and examined the patient. The following aspects are pertinent.  Past Medical History:  Diagnosis Date  . Acute blood loss anemia   . Acute ischemic right MCA stroke ([place]) 11/27/2018  . Acute on chronic diastolic CHF (congestive heart failure) ([place]) 12/30/2018  . Acute on chronic respiratory failure with hypercapnia ([place]) 10/28/2018  . Acute on chronic respiratory failure with hypoxia ([place]) 12/30/2018  . Altered mental status   . Anxiety and depression   . Borderline abnormal TFTs 05/02/2018  . Cerebellar stroke, acute ([place]) 11/08/2018  . CHF (congestive heart failure) ([place]) 05/16/2018  . Chronic respiratory failure with hypoxia ([place]) 10/02/2017   Assoc with ? Cor pulmonale dx 09/2017 so rx 02 1-2 lpm 24/7  - 10/17/2017  Walked RA x 3 laps @ 185 ft each stopped due to  End of study, nl pace,  desat to 87 on 3rd lap - 12/19/2017  Saturations on Room Air at Rest =91 % and while Ambulating = 87%  But  on  2 Liters of pulsed oxygen while Ambulating =93% so rec POC 2lpm walking / use at rest if sats under 90%      . Chronic respiratory failure with hypoxia and hypercapnia ([place]) 05/03/2018   Assoc with   Cor pulmonale dx 09/2017 so rx 02 1-2 lpm 24/7  - 10/17/2017  Walked RA x 3 laps @ 185 ft each  stopped due to  End of study, nl pace,  desat to 87 on 3rd lap - 12/19/2017  Saturations on Room Air at Rest =91 % and while Ambulating = 87%  But  on  2 Liters of pulsed oxygen while Ambulating =93% so rec POC 2lpm walking / use at rest if sats under 90%  - HC03 05/02/2018  = 34  - HC03 11/16  . Colon cancer ([place]) 1988   Resected  . Constipation   . COPD (chronic obstructive pulmonary disease) ([place])   . COPD GOLD  II  spirometry, severe Emphysema and 02 dep 11/22/2015   Quit smoking 1989 - Spirometry 09/11/2016  FEV1 1.03 (52%)  Ratio 54 mild curvature, on no rx - 09/11/2016  After extensive coaching HFA effectiveness =    50% from baseline 10% > continue  symb 160 2bid but consider change to bevespi or stiolto  - 10/02/2017  After extensive coaching inhaler device,  effectiveness =    50% from a baseline near 0 >  rechallenge with symbicort 160 x 2 bid x 2 weeks the  . COPD with acute exacerbation ([place]) 10/28/2018  . Diabetes mellitus without complication ([name])    diet controlled- no meds. per pt  . Diarrhea 04/02/2018  . Diverticulosis   . Elevated troponin 10/28/2018  . Essential hypertension 11/22/2015  . GERD (gastroesophageal reflux disease) 10/28/2018  . Hematuria 09/27/2017  . History of colon cancer 06/11/1986   Resected 1988, [place]  . Hx of colonic polyps 02/10/2013  . Hyperlipidemia   . Hyperparathyroidism, primary ([place]) 07/27/2016  . Hypertension   . Hypertensive urgency 11/08/2018  . Hypoxia 10/30/2018  . ICH (intracerebral hemorrhage) ([place]) s/p tPA administration 11/08/2018  . Insomnia   . Lactic acidosis 10/28/2018  . Loss of appetite 06/02/2016  . Lower extremity edema 02/20/2018  . On home oxygen therapy 07/12/2018  . Primary hyperparathyroidism ([place])   . Protein-calorie malnutrition, severe 09/17/2017  . Pulmonary hypertension ([name]) c/w cor pulmonale    See [facility] 09/20/17  With  low CO ? Accuracy of PVR  - tyvaso d/c'd 10/03/18 by [facility] due to cough, improved     . SBO  (small bowel obstruction) ([place])   . Stroke ([place]) 10/2018   tpa administered  . Supplemental oxygen dependent   . Tubular adenoma of rectum 02/23/2013   low grade  . Type 2 diabetes mellitus ([place]) 04/26/2018  . Unintended weight loss 04/26/2018  . UTI (urinary tract infection) 03/05/2019  . Vaginal candidiasis   . Vitamin D deficiency    Social History   Socioeconomic History  . Marital status: Married    Spouse name: Chalmers Cater  . Number of children: 3  . Years of education: Not on file  . Highest education level: Not on file  Occupational History    Comment: retired Quarry manager  Tobacco Use  . Smoking status: Former Smoker    Packs/day: 1.00    Years: 30.00    Pack years: 30.00    Types: Cigarettes    Quit date: 1989    Years since quitting: 32.3  . Smokeless tobacco: Never Used  Substance and Sexual Activity  . Alcohol use: No  . Drug use: No  . Sexual activity: Not Currently    Partners: Male    Birth control/protection: Post-menopausal, Surgical  Other Topics Concern  . Not on file  Social History Narrative   03/12/19 living with husband, Married. 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 Difficulty of Paying Living Expenses:   Food Insecurity:   . Worried About Charity fundraiser in the Last Year:   . Arboriculturist in the Last Year:   Transportation Needs:   . Film/video editor (Medical):   [name] Lack of Transportation (Non-Medical):   Physical Activity:   . Days of Exercise per Week:   . Minutes of Exercise per Session:   Stress:   . Feeling of Stress :   Social Connections:   . Frequency of Communication with Friends and Family:   . Frequency of Social Gatherings with Friends and Family:   . Attends Religious Services:   . Active Member of Clubs or Organizations:   . Attends Archivist Meetings:   [name]  Marital Status:    Family History  Problem Relation Age  of Onset  . Ovarian cancer Mother   . Breast cancer Neg Hx   . Hyperparathyroidism Neg Hx   . Colon cancer Neg Hx   . Esophageal cancer Neg Hx   . Stomach cancer Neg Hx    Scheduled Meds: . enoxaparin (LOVENOX) injection  20 mg Subcutaneous Q24H  . insulin aspart  0-15 Units Subcutaneous TID WC  . insulin aspart  0-5 Units Subcutaneous QHS  . ipratropium-albuterol  3 mL Nebulization QID  . methylPREDNISolone (SOLU-MEDROL) injection  60 mg Intravenous Q12H  . metoprolol tartrate  5 mg Intravenous Q6H   Continuous Infusions: . azithromycin 500 mg (04/20/19 1708)  . cefTRIAXone (ROCEPHIN)  IV 1 g (04/20/19 1627)  . dextrose 5 % and 0.9% NaCl Stopped (04/21/19 0430)   PRN Meds:.acetaminophen **OR** acetaminophen, hydrALAZINE, ondansetron **OR** ondansetron (ZOFRAN) IV Medications Prior to Admission:  Prior to Admission medications   Medication Sig Start Date End Date Taking? Authorizing Provider  albuterol (VENTOLIN HFA) 108 (90 Base) MCG/ACT inhaler INHALE 1-2 PUFFS INTO THE LUNGS EVERY 4 HOURS AS NEEDED FOR WHEEZING OR SHORTNESS OF BREATH. 04/30/18   Parrett, Fonnie Mu, NP  atorvastatin (LIPITOR) 20 MG tablet Take 1 tablet (20 mg total) by mouth every evening. For cholesterol 12/12/18   [name], NP  bisoprolol (ZEBETA) 5 MG tablet Take 2.5 mg by mouth daily. 01/25/19   [provider]  Blood Glucose Calibration (ACCU-CHEK AVIVA) SOLN USE AS INSTRUCTED TO TEST BLOOD SUGAR DAILY 05/28/18   [name], NP  Blood Glucose Monitoring Suppl (ACCU-CHEK AVIVA PLUS) w/Device KIT Use as instructed to test blood sugar up to 3 times daily 05/28/18   [name], NP  famotidine (PEPCID) 40 MG tablet Take 1 tablet twice 01/28/19   Tanda Rockers, MD  fluticasone furoate-vilanterol (BREO ELLIPTA) 100-25 MCG/INH AEPB Inhale 1 puff into the lungs daily.    [provider]  furosemide (LASIX) 40 MG tablet Take  40 mg by mouth daily.     [provider]  glipiZIDE (GLUCOTROL) 5 MG tablet Take 1 tablet (5 mg total) by mouth daily before breakfast. 01/07/19   [name], NP  glucose blood (ACCU-CHEK AVIVA PLUS) test strip Use as instructed to test blood sugar up to 3 times daily 05/28/18   [name], NP  KLOR-CON M10 10 MEQ tablet TAKE 2 TABLETS (20 MEQ TOTAL) BY MOUTH DAILY. FOR LOW POTASSIUM. 01/27/19   Larey Dresser, MD  Lancets (ACCU-CHEK SOFT [facility]) lancets Use as instructed to test blood sugar up to 3 times daily 05/28/18   [name], NP  mirtazapine (REMERON) 15 MG tablet TAKE 1 TABLET BY MOUTH AT BEDTIME. FOR DEPRESSION AND APPETITE. 09/25/18   [name], NP  OXYGEN Inhale 4 L into the lungs continuous.     [provider]  polyethylene glycol powder (GLYCOLAX/MIRALAX) 17 GM/SCOOP powder Take 17 g by mouth daily as needed for mild constipation (TO BE MIXED INTO 4-8 OUNCES OF LIQUID, THEN CONSUMED).  08/13/15   [provider]  rivaroxaban (XARELTO) 10 MG TABS tablet Take 1 tablet (10 mg total) by mouth daily. For stroke prevention. 01/27/19   [name], NP  spironolactone (ALDACTONE) 25 MG tablet Take 1 tablet (25 mg total) by mouth daily. Take at night 01/30/19   Larey Dresser, MD  umeclidinium bromide (INCRUSE ELLIPTA) 62.5 MCG/INH AEPB Inhale 1 puff into the lungs daily. 11/20/18   Bary Leriche, PA-C  No Known Allergies Review of Systems  Respiratory: Positive for shortness of breath.   Neurological: Positive for weakness.    Physical Exam Constitutional:      Appearance: She is cachectic. She is ill-appearing.     Interventions: Nasal cannula in place.  Cardiovascular:     Rate and Rhythm: Normal rate.  Pulmonary:     Breath sounds: Decreased breath sounds present.  Neurological:     Mental Status: She is alert.     Vital Signs: BP (!) 171/102   Pulse 81   Temp 98.9 F (37.2 C) (Oral)   Resp (!) 35   Ht _0   (1.626 m)   Wt 39 kg   SpO2 95%   BMI 14.76 kg/m  Pain Scale: 0-10 POSS *See Group Information*: 1-Acceptable,Awake and alert Pain Score: 0-No pain   SpO2: SpO2: 95 % O2 Device:SpO2: 95 % O2 Flow Rate: .O2 Flow Rate (L/min): 15 L/min  IO: Intake/output summary:   Intake/Output Summary (Last 24 hours) at 04/21/2019 1242 Last data filed at 04/20/2019 2300 Gross per 24 hour  Intake 2301.67 ml  Output 750 ml  Net 1551.67 ml    LBM: Last BM Date: 04/18/19 Baseline Weight: Weight: 39 kg Most recent weight: Weight: 39 kg     Palliative Assessment/Data: 30 % at best   Discussed with Dr Sherral Hammers  Time In: 1215 Time Out: 1330 Time Total: 75 minutes Greater than 50%  of this time was spent counseling and coordinating care related to the above assessment and plan.  Signed by: Wadie Lessen, NP   Please contact Palliative Medicine Team phone at [phone] for questions and concerns.  For individual provider: See Shea Evans

## 2019-04-21 NOTE — Progress Notes (Signed)
RT NOTES:  ABG obtained and sent to lab. Lab tech [place] notified.

## 2019-04-21 NOTE — Progress Notes (Signed)
Pt sating 96% on nonrebreather and resps are even and unlabored. Replaced nonrebreather with HFNC at 14Ls. Will continue to monitor. Pt states she is less anxious.

## 2019-04-21 NOTE — Progress Notes (Signed)
PROGRESS NOTE    Denise [name]  AJO:[id number] DOB: 1945/03/07 DOA: 04/19/2019 PCP: [name], NP     Brief Narrative:  74 y.o. BF PMHx essential HTN, HLD, DM type II controlled with complication, Chronic Respiratory Failure with hypoxia on 5 L O2 at home, COPD/Severe Emphysema, pulmonary HTN, Chronic Diastolic CHF, ischemic stroke s/p TPA-currently on Xarelto, protein calorie malnutrition,   Presents to emergency department due to worsening shortness of breath since couple of days.  Patient tells me that she has worsening shortness of breath since couple of days.  Has been feeling unwell.  She denies association with wheezing, cough, congestion, sputum production, fever, chills, leg swelling, orthopnea, PND, chest pain, headache, blurry vision, abdominal pain, decreased appetite or bowel changes.  Reports increased urinary frequency and strong smell since couple of days and associated with lower abdominal pain.  She denies nausea, vomiting, dysuria, hematuria.  She lives with her husband at home.  No history of smoking, alcohol, illicit drug use.  She has home health coming out to the house several days a week.  Upon arrival of EMS: She noted to have hypoxia in 60s she placed on oxygen mask by EMS.  ED Course: Upon arrival to ED: Patient tachypneic, afebrile with WBC of 3.9, lactic acid 2.8 trended up to 3.1, troponin: 34, UA positive for nitrates and leukocytes, POC COVID-19 negative.  She is requiring 6 L of oxygen via nasal cannula in ED.  She received loading dose of IV Solu-Medrol, IV fluid bolus and albuterol breathing treatment in ED.  She also received IV Rocephin for UTI.  [facility] hospitalist consulted for admission for acute on chronic hypoxemic respiratory failure.    Subjective: 4/19 A/O x4, EXTREMELY S OB, tachycardic, negative CP, positive cachectic  Assessment & Plan:   Active Problems:   COPD (chronic obstructive pulmonary disease) ([name])   Anxiety  and depression   Hyperlipidemia   Essential hypertension   Pulmonary hypertension ([name]) c/w cor pulmonale   Type 2 diabetes mellitus ([name])   Elevated troponin   Lactic acidosis   GERD (gastroesophageal reflux disease)   Acute on chronic respiratory failure with hypercapnia ([name])   History of ischemic stroke   Acute on chronic respiratory failure with hypoxia ([name])   Chronic diastolic CHF (congestive heart failure) ([name])   UTI (urinary tract infection)   Leukopenia   CKD (chronic kidney disease), stage III   Acute on chronic respiratory failure with hypoxemia ([name])   Emphysema lung ([name])   Sepsis due to Escherichia coli (E. coli) ([place])  Acute on chronic respiratory failure with hypoxia/COPD exacerbation -Setting of most likely end-stage emphysema/COPD. -Solu-Medrol 60 mg  BID -DuoNeb QID -Morphine 1 to 2 mg PRN S OB -Continue 7-day course antibiotics -Titrate O2 to maintain SPO2> 88% -4/18 D-dimer negative  -4/19 PCXR; flattened diaphragms consistent with COPD infiltrates see results below  Sepsis -On admission patient meets criteria for sepsis.  WBC<4K, RR> 20, site of infection lungs/renal. -D5-0.9% saline 17m/hr -See respiratory failure  UTI positive E. coli -Will be covered by antibiotics for COPD exacerbation  Lactic acidosis: -Multifactorial sepsis, dehydration. -See sepsis  Chronic diastolic CHF -Strict ins and out +2.6 L -Daily weight Filed Weights   04/19/19 1633  Weight: 39 kg  -4/18 echocardiogram pending -Hold diuretics -Metoprolol IV 5 mg QID -Hydralazine IV 7 mg q 4hr  Atrial fibrillation -Currently rate controlled  Essential HTN -see CHF  Elevated troponin - first troponin 34. -Negative CP -Trend troponin Results for  [name], Denise (MRN [id number]) as of 04/21/2019 13:54  Ref. Range 04/19/2019 14:54 04/20/2019 19:46 04/21/2019 00:19 04/21/2019 03:43  Troponin I (High Sensitivity) Latest Ref Range: <18 ng/L 34 (H) 39 (H) 40 (H) 30 (H)   -EKG no ST-T wave changes noted.  DM type II controlled with complication -Moderate SSI  Hx ischemic stroke -Patient A/O x4 day.  Severe protein calorie malnutrition/failure to thrive -N.p.o. -4/18 failed swallow evaluation -4/19 barium swallow evaluation pending  HLD -Statin on hold  CKD stage IIIa (baseline Cr ~1.21) Recent Labs  Lab 04/19/19 1215 04/19/19 1217 04/20/19 0248 04/21/19 0019  CREATININE 1.10* 1.15* 1.01* 0.85  -Better than baseline with hydration  Leukopenia   Goals of care -4/18 Palliative Care Consult; patient with dementia, severe emphysema/COPD severe protein calorie malnutrition.  Evaluate for change to DNR status, palliative care services at home when ready for discharge -Unless patient has significant improvement significant risk of not surviving this hospitalization.  DVT prophylaxis: Lovenox Code Status: Full Family Communication:  Disposition Plan: TBD 1.  Where the patient is from 2.  Anticipated d/c place. 3.  Barriers to d/c await palliative care recommendations   Consultants:    Procedures/Significant Events:  4/19 PCXR;Streaky left basilar density, likely atelectasis    I have personally reviewed and interpreted all radiology studies and my findings are as above.  VENTILATOR SETTINGS: HFNC 4/19 Flow; 15L/min SPO2 90%  RR; 35   4/19 VBG pH 7.35 PCO2 50.1 PO2<31   Cultures 4/17 urine positive E. coli 4/17 blood LEFT forearm NGTD 4/17 blood RIGHT wrist NGTD   Antimicrobials: Anti-infectives (From admission, onward)   Start     Dose/Rate Stop   04/20/19 1600  cefTRIAXone (ROCEPHIN) 1 g in sodium chloride 0.9 % 100 mL IVPB     1 g 200 mL/hr over 30 Minutes     04/19/19 1730  azithromycin (ZITHROMAX) 500 mg in sodium chloride 0.9 % 250 mL IVPB     500 mg 250 mL/hr over 60 Minutes     04/19/19 1600  cefTRIAXone (ROCEPHIN) 1 g in sodium chloride 0.9 % 100 mL IVPB     1 g 200 mL/hr over 30 Minutes 04/19/19 1640        Devices    LINES / TUBES:      Continuous Infusions: . azithromycin 500 mg (04/20/19 1708)  . cefTRIAXone (ROCEPHIN)  IV 1 g (04/20/19 1627)  . dextrose 5 % and 0.9% NaCl Stopped (04/21/19 0430)     Objective: Vitals:   04/21/19 0658 04/21/19 0728 04/21/19 0745 04/21/19 0746  BP:   (!) 195/111 (!) 162/120  Pulse:      Resp:      Temp:      TempSrc:      SpO2: 97% 93%    Weight:      Height:        Intake/Output Summary (Last 24 hours) at 04/21/2019 0757 Last data filed at 04/20/2019 2300 Gross per 24 hour  Intake 2301.67 ml  Output 750 ml  Net 1551.67 ml   Filed Weights   04/19/19 1633  Weight: 39 kg   Physical Exam:  General: A/O x4, positive acute on chronic respiratory distress, cachectic Eyes: negative scleral hemorrhage, negative anisocoria, negative icterus ENT: Negative Runny nose, negative gingival bleeding, Neck:  Negative scars, masses, torticollis, lymphadenopathy, JVD Lungs: Tachypneic, diffuse poor air movement  bilaterally without wheezes or crackles Cardiovascular: Irregularly irregular rhythm and rate without murmur gallop or rub normal S1 and  S2 Abdomen: negative abdominal pain, nondistended, positive soft, bowel sounds, no rebound, no ascites, no appreciable mass Extremities: No significant cyanosis, clubbing, or edema bilateral lower extremities Skin: Negative rashes, lesions, ulcers Psychiatric:  Negative depression, negative anxiety, negative fatigue, negative mania EXTREMELY POOR understanding of her underlying disease process Central nervous system:  Cranial nerves II through XII intact, tongue/uvula midline, all extremities muscle strength 5/5, sensation intact throughout, negative dysarthria, negative expressive aphasia, negative receptive aphasia.   .     Data Reviewed: Care during the described time interval was provided by me .  I have reviewed this patient's available data, including medical history, events of note,  physical examination, and all test results as part of my evaluation.  CBC: Recent Labs  Lab 04/19/19 1215 04/19/19 1217 04/19/19 1251 04/20/19 0248 04/21/19 0019  WBC  --  3.9*  --  3.3* 4.5  NEUTROABS  --  2.8  --   --   --   HGB 14.6 13.2 12.2 12.5 10.9*  HCT 43.0 43.9 36.0 40.7 35.1*  MCV  --  88.5  --  87.0 87.1  PLT  --  175  --  192 174   Basic Metabolic Panel: Recent Labs  Lab 04/19/19 1215 04/19/19 1217 04/19/19 1251 04/19/19 1454 04/20/19 0248 04/21/19 0019  NA 142 143 141  --  144 145  K 4.2 5.0 3.8  --  4.5 4.4  CL 103 103  --   --  108 111  CO2  --  30  --   --  27 26  GLUCOSE 204* 224*  --   --  183* 240*  BUN 25* 22  --   --  22 23  CREATININE 1.10* 1.15*  --   --  1.01* 0.85  CALCIUM  --  11.1*  --   --  10.8* 10.8*  MG  --   --   --  1.8  --  1.7  PHOS  --   --   --   --   --  3.3   GFR: Estimated Creatinine Clearance: 36.3 mL/min (by C-G formula based on SCr of 0.85 mg/dL). Liver Function Tests: Recent Labs  Lab 04/19/19 1217 04/20/19 0248 04/21/19 0019  AST 35 25 21  ALT _0 ALKPHOS 96 84 69  BILITOT 1.0 0.8 0.7  PROT 6.8 6.4* 5.8*  ALBUMIN 3.9 3.7 3.6   No results for input(s): LIPASE, AMYLASE in the last 168 hours. No results for input(s): AMMONIA in the last 168 hours. Coagulation Profile: Recent Labs  Lab 04/19/19 1217  INR 2.0*   Cardiac Enzymes: No results for input(s): CKTOTAL, CKMB, CKMBINDEX, TROPONINI in the last 168 hours. BNP (last 3 results) Recent Labs    05/02/18 1249 06/24/18 1111  PROBNP 910.0* 840.0*   HbA1C: No results for input(s): HGBA1C in the last 72 hours. CBG: Recent Labs  Lab 04/20/19 0750 04/20/19 1127 04/20/19 1629 04/20/19 2039 04/21/19 0754  GLUCAP 154* 126* 135* 165* 152*   Lipid Profile: No results for input(s): CHOL, HDL, LDLCALC, TRIG, CHOLHDL, LDLDIRECT in the last 72 hours. Thyroid Function Tests: No results for input(s): TSH, T4TOTAL, FREET4, T3FREE, THYROIDAB in the last  72 hours. Anemia Panel: No results for input(s): VITAMINB12, FOLATE, FERRITIN, TIBC, IRON, RETICCTPCT in the last 72 hours. Sepsis Labs: Recent Labs  Lab 04/19/19 1217 04/19/19 1449  LATICACIDVEN 2.8* 3.1*    Recent Results (from the past 240 hour(s))  Blood Culture (routine x 2)  Status: None (Preliminary result)   Collection Time: 04/19/19 11:57 AM   Specimen: BLOOD LEFT FOREARM  Result Value Ref Range Status   Specimen Description BLOOD LEFT FOREARM  Final   Special Requests   Final    BOTTLES DRAWN AEROBIC AND ANAEROBIC Blood Culture results may not be optimal due to an inadequate volume of blood received in culture bottles   Culture   Final    NO GROWTH 1 DAY Performed at [facility], [place] [address], [place], [place] [address]    Report Status PENDING  Incomplete  Blood Culture (routine x 2)     Status: None (Preliminary result)   Collection Time: 04/19/19 11:58 AM   Specimen: BLOOD RIGHT WRIST  Result Value Ref Range Status   Specimen Description BLOOD RIGHT WRIST  Final   Special Requests   Final    BOTTLES DRAWN AEROBIC ONLY Blood Culture results may not be optimal due to an inadequate volume of blood received in culture bottles   Culture   Final    NO GROWTH 1 DAY Performed at [facility], [place] [address], [place], [place] [address]    Report Status PENDING  Incomplete  Urine culture     Status: Abnormal (Preliminary result)   Collection Time: 04/19/19  2:49 PM   Specimen: In/Out Cath Urine  Result Value Ref Range Status   Specimen Description IN/OUT CATH URINE  Final   Special Requests NONE  Final   Culture (A)  Final    >=100,000 COLONIES/mL ESCHERICHIA COLI SUSCEPTIBILITIES TO FOLLOW Performed at [facility], [place] [address] [place],  [address]    Report Status PENDING  Incomplete         Radiology Studies: DG CHEST PORT 1 VIEW  Result Date: 04/21/2019 CLINICAL DATA:  Shortness of breath. EXAM: PORTABLE CHEST 1 VIEW  COMPARISON:  04/19/2019 FINDINGS: The cardiac silhouette, mediastinal and hilar contours are stable. Streaky left basilar density, likely atelectasis. No definite infiltrates or effusions. Clothing artifact overlying the right chest. No pneumothorax. IMPRESSION: Streaky left basilar density, likely atelectasis. Electronically Signed   By: Marijo Sanes M.D.   On: 04/21/2019 06:09   DG Chest Port 1 View  Result Date: 04/19/2019 CLINICAL DATA:  Dyspnea, hypoxia, COPD EXAM: PORTABLE CHEST 1 VIEW COMPARISON:  12/30/2018 chest radiograph. FINDINGS: Loop recorder overlies the lower left chest. Stable cardiomediastinal silhouette with normal heart size. No pneumothorax. No pleural effusion. No pulmonary edema. No acute consolidative airspace disease. Emphysema. IMPRESSION: No acute cardiopulmonary disease.  Emphysema. Electronically Signed   By: Ilona Sorrel M.D.   On: 04/19/2019 12:09        Scheduled Meds: . enoxaparin (LOVENOX) injection  20 mg Subcutaneous Q24H  . insulin aspart  0-15 Units Subcutaneous TID WC  . insulin aspart  0-5 Units Subcutaneous QHS  . ipratropium-albuterol  3 mL Nebulization QID  . methylPREDNISolone (SOLU-MEDROL) injection  60 mg Intravenous Q12H   Continuous Infusions: . azithromycin 500 mg (04/20/19 1708)  . cefTRIAXone (ROCEPHIN)  IV 1 g (04/20/19 1627)  . dextrose 5 % and 0.9% NaCl Stopped (04/21/19 0430)     LOS: 2 days    Time spent:40 min    Joceline Hinchcliff, Geraldo Docker, MD [facility] Pager [phone]  If 7PM-7AM, please contact night-coverage [email or web address] Password [facility] 04/21/2019, 7:57 AM

## 2019-04-21 NOTE — Progress Notes (Signed)
RT x2 attempts to collect ABG. Collected Venous Blood and RN aware. Pt stable on NRB.

## 2019-04-21 NOTE — Progress Notes (Addendum)
Modified Barium Swallow Progress Note  Patient Details  Name: Denise [name] MRN: [id number] Date of Birth: 24-Oct-1945  Today's Date: 04/21/2019  Modified Barium Swallow completed.  Full report located under Chart Review in the Imaging Section.  Brief recommendations include the following:  Clinical Impression  Pt was seen for a modified barium swallow study and she presents with mild oropharyngeal dysphagia with resultant laryngeal penetration of thin liquid and nectar-thick liquid on today's examination.  Laryngeal penetration occurred before the swallow secondary to delayed swallow initiation, and it was transient with thin liquid and nectar-thick liquid trials except for thin liquid via cup sip. No aspiration was observed with any trials and no laryngeal penetration was observed with honey-thick liquid, puree, or regular solid trials.  Of note, pt had tachypnea following thin liquid and nectar-thick liquid trials.  Oral phase was remarkable for prolonged mastication of regular solids, reduced lingual control resulting in premature spillage to the pharynx, and reduced lingual strength resulting in trace-mild oral residue.  Pharyngeal phase was remarkable for reduced hyolaryngeal elevation/excursion resulting in vallecular and pyriform residue, reduced BOT retraction resulting in pyriform residue, and reduced pharyngeal constriction resulting posterior pharyngeal wall residue.  Due to increased risk of aspiration with thin liquid and nectar-thick liquid given laryngeal penetration and tachypnea, recommend initiation of Dysphagia 2 (fine chop) solids and honey-thick liquids with medications administered whole in puree.  Pt may have ice chips and sips of water with full RN supervision before or 30+ minutes after a meal following thorough oral care.  If pt/family wish to pursue a more liberalized diet with known risk of aspiration, would recommend soft/regular solids and thin liquids with use of  compensatory strategies.     Swallow Evaluation Recommendations       SLP Diet Recommendations: Dysphagia 2 (Fine chop) solids;Honey thick liquids;Free water protocol after oral care;Ice chips PRN after oral care   Liquid Administration via: Cup;Straw   Medication Administration: Whole meds with puree   Supervision: Patient able to self feed;Intermittent supervision to cue for compensatory strategies   Compensations: Minimize environmental distractions;Slow rate;Small sips/bites   Postural Changes: Seated upright at 90 degrees   Oral Care Recommendations: Oral care BID;Oral care before and after PO   Other Recommendations: Prohibited food (jello, ice cream, thin soups);Order thickener from [place]., M.S., [place] Office: [phone]  [place] 04/21/2019,3:42 PM

## 2019-04-21 NOTE — Telephone Encounter (Addendum)
Denise [name] left v/m that pt went to [facility] on 04/19/19 and was admitted due to not enough oxygen. Per [facility] ED note pt admitted for respiratory failure. FYI to Gentry Fitz NP who is PCP but not in office today. Also send note to Avie Echevaria NP who is in office this AM.

## 2019-04-21 NOTE — Significant Event (Addendum)
Rapid Response Event Note  Overview: Called d/t respiratory distress, SpO2-70s on NRB. Pt was on 7L HFNC at the beginning of day shift and her FiO2 demand has been increasing all day.   Initial Focused Assessment: Pt sitting up in bed in respiratory distress, +WOB, +accessory muscle use. Pt is saying "help me, help me I can't breathe" over and over again. Lungs clear and diminished, Skin warm and dry. T-97.9, HR-78, BP-160/116, RR-40s, SpO2-82% on NRB. Pt placed on NRB and 15L HFNC with SpO2 increasing to 96%.  Interventions: Solu-medrol 0530 dose given at [place] done now ABG-7.35/50.1/<31,27.1(this is a venous sample) Plan of Care (if not transferred): 0535-Pt is sleeping on NRB, SpO2-96%, RR-16. Please wean NRB back to HFNC as SpO2 allows. Call RRT if further assistance needed.  Event Summary:  Kennon Holter, NP notified at [place]: [place] Arrived: 0410 Ended: 0430 [place]

## 2019-04-21 NOTE — Progress Notes (Signed)
Initial Nutrition Assessment  RD working remotely.  DOCUMENTATION CODES:   Underweight  INTERVENTION:   -RD will follow for diet advancement and add supplements as appropriate  NUTRITION DIAGNOSIS:   Inadequate oral intake related to inability to eat as evidenced by NPO status.  GOAL:   Patient will meet greater than or equal to 90% of their needs  MONITOR:   Diet advancement, Labs, Weight trends, Skin, I & O's  REASON FOR ASSESSMENT:   Malnutrition Screening Tool, Consult Assessment of nutrition requirement/status  ASSESSMENT:   Denise [name] is a 74 y.o. female with medical history significant of hypertension, hyperlipidemia, type 2 diabetes mellitus, chronic hypoxemic respiratory failure due to underlying COPD-on  5 L of oxygen at home, pulmonary hypertension, ischemic stroke s/p TPA-currently on Xarelto, protein calorie malnutrition, grade 1 diastolic congestive heart failure, presents to emergency department due to worsening shortness of breath since couple of days.  Pt admitted with acute on chronic respiratory failure secondary to COPD exacerbation and UTI.   4/18- s/p BSE- recommend NPO  Reviewed I/O's: +1.6 L x 24 hours and +2.7 L since admission  UOP: 750 ml x 24 hours  Attempted to speak with pt via phone, however, no answer.   Reviewed wt hx; pt has experienced a 3.9% wt loss over the past month. While this is not significant for time frame, it is still concerning given multiple co-morbidities. Highly suspect pt with malnutrition, however, unable to identify at this time.   Medications reviewed and include methylprednisone.   Lab Results  Component Value Date   HGBA1C 6.2 (A) 03/05/2019   PTA DM medications are 5 mg glipizide daily.   Labs reviewed: CBGS: 152 (inpatient orders for glycemic control are 0-15 units inuslin aspart TID with meals and 0-5 units insulin aspart q HS, ).   Diet Order:   Diet Order            Diet NPO time specified  Except for: Ice Chips  Diet effective now              EDUCATION NEEDS:   Not appropriate for education at this time  Skin:  Skin Assessment: Reviewed RN Assessment  Last BM:  04/18/19  Height:   Ht Readings from Last 1 Encounters:  04/19/19 _0  (1.626 m)    Weight:   Wt Readings from Last 1 Encounters:  04/19/19 39 kg    Ideal Body Weight:  54.4 kg  BMI:  Body mass index is 14.76 kg/m.  Estimated Nutritional Needs:   Kcal:  1550-1750  Protein:  85-100 grams  Fluid:  > 1.5 L    Loistine Chance, RD, LDN, [place] Registered Dietitian II Certified Diabetes Care and Education Specialist Please refer to [facility] for RD and/or RD on-call/weekend/after hours pager

## 2019-04-21 NOTE — Significant Event (Addendum)
Rapid Response Event Note  Overview: Follow Up - Respiratory  Received a call from [facility] - per nursing staff - patient was in distress and did not look well. I went to the bedside. Upon arrival, patient was sleeping comfortably - RR 20 - no accessory muscle use, 93% on HFNC 14L. Patient does not in acute respiratory distress.   Call RRRN as needed for assistance   Call Time 0732 Arrival Time 0733 End Time 0740   Carolanne Mercier R

## 2019-04-21 NOTE — Procedures (Signed)
Patient in distress, cannot perform complete echo at this time.

## 2019-04-22 ENCOUNTER — Inpatient Hospital Stay (HOSPITAL_COMMUNITY): Payer: Medicare HMO

## 2019-04-22 ENCOUNTER — Telehealth: Payer: Self-pay | Admitting: Primary Care

## 2019-04-22 DIAGNOSIS — N1831 Chronic kidney disease, stage 3a: Secondary | ICD-10-CM

## 2019-04-22 DIAGNOSIS — I5031 Acute diastolic (congestive) heart failure: Secondary | ICD-10-CM

## 2019-04-22 DIAGNOSIS — J9621 Acute and chronic respiratory failure with hypoxia: Secondary | ICD-10-CM

## 2019-04-22 LAB — COMPREHENSIVE METABOLIC PANEL
ALT: 21 U/L (ref 0–44)
AST: 25 U/L (ref 15–41)
Albumin: 3.7 g/dL (ref 3.5–5.0)
Alkaline Phosphatase: 73 U/L (ref 38–126)
Anion gap: 11 (ref 5–15)
BUN: 30 mg/dL — ABNORMAL HIGH (ref 8–23)
CO2: 25 mmol/L (ref 22–32)
Calcium: 11.3 mg/dL — ABNORMAL HIGH (ref 8.9–10.3)
Chloride: 110 mmol/L (ref 98–111)
Creatinine, Ser: 0.82 mg/dL (ref 0.44–1.00)
GFR calc Af Amer: >60 mL/min (ref >60)
GFR calc non Af Amer: >60 mL/min (ref >60)
Glucose, Bld: 197 mg/dL — ABNORMAL HIGH (ref 70–99)
Potassium: 4 mmol/L (ref 3.5–5.1)
Sodium: 146 mmol/L — ABNORMAL HIGH (ref 135–145)
Total Bilirubin: 0.5 mg/dL (ref 0.3–1.2)
Total Protein: 6 g/dL — ABNORMAL LOW (ref 6.5–8.1)

## 2019-04-22 LAB — CBC
HCT: 41.4 % (ref 36.0–46.0)
Hemoglobin: 12.9 g/dL (ref 12.0–15.0)
MCH: 27.1 pg (ref 26.0–34.0)
MCHC: 31.2 g/dL (ref 30.0–36.0)
MCV: 87 fL (ref 80.0–100.0)
Platelets: 154 10*3/uL (ref 150–400)
RBC: 4.76 MIL/uL (ref 3.87–5.11)
RDW: 14.6 % (ref 11.5–15.5)
WBC: 7.4 10*3/uL (ref 4.0–10.5)
nRBC: 0 % (ref 0.0–0.2)

## 2019-04-22 LAB — GLUCOSE, CAPILLARY
Glucose-Capillary: 208 mg/dL — ABNORMAL HIGH (ref 70–99)
Glucose-Capillary: 147 mg/dL — ABNORMAL HIGH (ref 70–99)
Glucose-Capillary: 164 mg/dL — ABNORMAL HIGH (ref 70–99)
Glucose-Capillary: 189 mg/dL — ABNORMAL HIGH (ref 70–99)

## 2019-04-22 LAB — PHOSPHORUS: Phosphorus: 2.8 mg/dL (ref 2.5–4.6)

## 2019-04-22 LAB — ECHOCARDIOGRAM COMPLETE
Height: 64 in
Weight: 1643.75 oz

## 2019-04-22 LAB — MAGNESIUM: Magnesium: 1.8 mg/dL (ref 1.7–2.4)

## 2019-04-22 MED ORDER — METOPROLOL TARTRATE 5 MG/5ML IV SOLN
5.0000 mg | Freq: Once | INTRAVENOUS | Status: AC
  Administered 2019-04-22: 5 mg via INTRAVENOUS
  Filled 2019-04-22: qty 5

## 2019-04-22 MED ORDER — ADULT MULTIVITAMIN W/MINERALS CH
1.0000 | ORAL_TABLET | Freq: Every day | ORAL | Status: DC
  Administered 2019-04-22: 1 via ORAL
  Filled 2019-04-22: qty 1

## 2019-04-22 MED ORDER — LORAZEPAM 2 MG/ML IJ SOLN
1.0000 mg | INTRAMUSCULAR | Status: DC | PRN
  Administered 2019-04-22 – 2019-04-23 (×5): 1 mg via INTRAVENOUS
  Filled 2019-04-22 (×5): qty 1

## 2019-04-22 MED ORDER — MORPHINE SULFATE (PF) 2 MG/ML IV SOLN
2.0000 mg | INTRAVENOUS | Status: DC | PRN
  Administered 2019-04-22: 2 mg via INTRAVENOUS
  Administered 2019-04-23 (×2): 3 mg via INTRAVENOUS
  Administered 2019-04-23: 2 mg via INTRAVENOUS
  Filled 2019-04-22: qty 2
  Filled 2019-04-22 (×2): qty 1
  Filled 2019-04-22: qty 2

## 2019-04-22 MED ORDER — DILTIAZEM HCL-DEXTROSE 125-5 MG/125ML-% IV SOLN (PREMIX)
5.0000 mg/h | INTRAVENOUS | Status: DC
  Administered 2019-04-22: 5 mg/h via INTRAVENOUS
  Administered 2019-04-23: 10 mg/h via INTRAVENOUS
  Filled 2019-04-22 (×4): qty 125

## 2019-04-22 NOTE — Progress Notes (Signed)
Manufacturing engineer Documentation      Liaison received referral for pt to return home s/p discharge with hospice services.     Writer contacted pt's sister to confirm interest. Explained hospice services and philosophy and answered all questions. Sister agrees to hospice services and stated that pt does need O2. Unsure of any other DME needs but took liaison number so that husband can call back.    [name] chart in review by hospice MD to determine eligibility. Will update TOC and family once pt is approved.    Please do not hesitate to outreach with any questions and thank you for the referral.     Freddie Breech, RN  [facility] Liaison  [phone]

## 2019-04-22 NOTE — TOC Transition Note (Addendum)
Transition of Care [facility]) - CM/SW Discharge Note   Patient Details  Name: Denise [name] MRN: [id number] Date of Birth: 1945/11/02  [facility] CM/SW Contact:  Atilano Median, LCSW Phone Number: 04/22/2019, 2:45 PM   Clinical Narrative:     Update 04/23/19 4:11PM [facility] called and transport set. Admission will take place tomorrow morning. Patient's sister Denise [name] aware to call on call number tonight should the need arise. Oxygen has been delivered to the home, confirmed by Denise [name].   Discharged home with hospice. Referral coordinated with Denise [name]([facility]). Patient and sister Denise [name] aware and agreeable to this plan. Plan is for oxygen to be delivered to the home prior to discharge. No other needs at this time. Case closed to this CSW.   Final next level of care: Home w Hospice Care Barriers to Discharge: Barriers Resolved   Patient Goals and CMS Choice   CMS Medicare.gov Compare Post Acute Care list provided to:: Patient Represenative (must comment)(Denise [name]) Choice offered to / list presented to : Sibling  Discharge Placement                       Discharge Plan and Services                                     Social Determinants of Health (SDOH) Interventions     Readmission Risk Interventions Readmission Risk Prevention Plan 12/31/2018  Transportation Screening Complete  Medication Review ([place]) Complete  PCP or Specialist appointment within 3-5 days of discharge Complete  HRI or Home Care Consult Complete  Some recent data might be hidden

## 2019-04-22 NOTE — Telephone Encounter (Signed)
Amber,[facility], called. Patient will be discharged from [facility] this afternoon or tomorrow. Patient will need hospice services.  Amber wants to know if Anda Kraft will agree to be patient's attending.  If so, they will send paperwork for signature. If Amber's not available when you return her call, a detailed message may be left on her secure voice mail.

## 2019-04-22 NOTE — Telephone Encounter (Signed)
I agree to be patient's attending provider for hospice services.

## 2019-04-22 NOTE — Progress Notes (Signed)
  Speech Language Pathology Treatment: Dysphagia  Patient Details Name: Denise [name] MRN: [id number] DOB: 01/13/1945 Today's Date: 04/22/2019 Time: 7001-7494 SLP Time Calculation (min) (ACUTE ONLY): 23 min  Assessment / Plan / Recommendation Clinical Impression  Pt was seen for skilled ST targeting diet tolerance and diagnostic treatment.  Per most recent palliative care note, plan is for pt to discharge home with hospice and comfort feeds.  Confirmed this with the pt's sister and husband.  Pt was seen with trials of thin liquid, puree, and regular solids to help aid family with deciding the safest/least restrictive comfort feeds.  Pt tolerated sips of thin liquid via straw sip without overt s/sx of aspiration or difficulty.  Spoke with family regarding the pt having thin liquid with known risk of aspiration and possible discomfort from coughing vs honey-thick liquid and they stated that they would like for the pt to have thin liquid.  Mastication of regular solids was prolonged and suspect prolonged AP transport with puree; however, no overt s/sx of aspiration were present with either trial.  Recommend Dysphagia 2 (fine chop) solids and thin liquids with medications administered crushed in puree.  Pt will benefit from full supervision to assist with self feeding and to cue for compensatory strategies (listed below).  Spoke with the pt and family regarding how to prepare the pt's preferred meals to achieve a Dysphagia 2 or Dysphagia 3 texture for the safest and most comfortable intake.  Additionally discussed compensatory strategies with the family and they verbalized understanding.  All family questions were answered throughout this tx session and written diet recommendations/compensatory strategies were provided to the family.  No further skilled ST is warranted at this time.  Please re-consult if additional needs arise.    HPI HPI: Pt is a 74 y.o. female with medical history significant of  hypertension, hyperlipidemia, type 2 diabetes mellitus, chronic hypoxemic respiratory failure due to underlying COPD-on  5 L of oxygen at home, pulmonary hypertension, ischemic stroke s/p TPA-currently on Xarelto, protein calorie malnutrition, grade 1 diastolic congestive heart failure, who presented to emergency department due to worsening shortness of breath. CXR was negative for acute cardiopulmonary disease. MBS 11/29/18: decreased bolus organization, premature spillage of thin liquids with trace aspiration before laryngeal-vestibular closure. Nectars were noted to penetrate the larynx, but were not aspirated.  There was minimal residue post-swallow.  Aspiration was accompanied by a mild cough.      SLP Plan  Discharge SLP treatment due to (comment)(Pt will be going home with hospice )       Recommendations  Diet recommendations: Thin liquid;Dysphagia 2 (fine chop) Liquids provided via: Teaspoon;Straw Medication Administration: Crushed with puree Supervision: Full supervision/cueing for compensatory strategies;Trained caregiver to feed patient Compensations: Minimize environmental distractions;Slow rate;Small sips/bites;Other (Comment)(Don't eat/drink if SOB) Postural Changes and/or Swallow Maneuvers: Seated upright 90 degrees                Oral Care Recommendations: Staff/trained caregiver to provide oral care;Oral care QID Follow up Recommendations: None SLP Visit Diagnosis: Dysphagia, oropharyngeal phase (R13.12) Plan: Discharge SLP treatment due to (comment)(Pt will be going home with hospice )       [place]., M.S., [place] Office: [phone]  [place] 04/22/2019, 3:59 PM

## 2019-04-22 NOTE — Progress Notes (Signed)
Patient ID: Denise [name], female   DOB: [birth year], 74 y.o.   MRN: [id number]  This NP visited patient at the bedside as a follow up for palliative medicine needs and emotional support.   Meet with husband and patient's sister as scheduled for continued conversation regarding GOCs, education offered regarding diagnosis, treatment options, prognosis, end of life wishes disposition and options.  Dr Sherral Hammers updated family on current medical situation and limited prognosis.   Family verbalize an understanding of the seriousness of patient's medical situation. Emotional support offered  Educated family on hospice benefit at home, left message for hospice liaison with family interest in home with hospice.   Plan of Care: -DNR/DNI -comfort feeds/hydration as toleated -home with hospice when logistics can be put in place/family is requesting [facility] -continue current medical interventions until discharge, once home with hospice support focus of care is comfort and dignity -avoid rehosptialization  Discussed natural trajectory and expectations at EOL      Questions and concerns addressed     Discussed with Dr Sherral Hammers     Total time spent on the unit was 35 minutes  Greater than 50% of the time was spent in counseling and coordination of care  Wadie Lessen NP  Palliative Medicine Team Team Phone # [phone] Pager [phone]

## 2019-04-22 NOTE — Progress Notes (Signed)
Nutrition Follow-up  DOCUMENTATION CODES:   Underweight  INTERVENTION:   -Magic cup TID with meals, each supplement provides 290 kcal and 9 grams of protein -MVI with minerals daily  NUTRITION DIAGNOSIS:   Inadequate oral intake related to inability to eat as evidenced by NPO status.  Progressing; advanced to dysphagia 2 diet with honey thick liquids  GOAL:   Patient will meet greater than or equal to 90% of their needs  Progressing   MONITOR:   PO intake, Supplement acceptance, Diet advancement, Labs, Weight trends, Skin, I & O's  REASON FOR ASSESSMENT:   Malnutrition Screening Tool, Consult Assessment of nutrition requirement/status  ASSESSMENT:   Denise [name] is a 74 y.o. female with medical history significant of hypertension, hyperlipidemia, type 2 diabetes mellitus, chronic hypoxemic respiratory failure due to underlying COPD-on  5 L of oxygen at home, pulmonary hypertension, ischemic stroke s/p TPA-currently on Xarelto, protein calorie malnutrition, grade 1 diastolic congestive heart failure, presents to emergency department due to worsening shortness of breath since couple of days.  4/18- s/p BSE- recommend NPO 4/19- s/p MBSS- advanced to dysphagia 2 diet with honey thick liquids  Attempted to speak with pt x 2, however, receiving nursing care at times of visits.  Per RN notes, pt struggling with respiratory distress and is on oxygen and morphine for air hunger.   Highly suspect pt with malnutrition, however, unable to identify at this time.   Palliative care following for goals of care; plan for now is to treat the treatable. Plan to meet again with family today to follow-up with goals of care discussions.   Labs reviewed: CBGS: [id number] (inpatient orders for glycemic control are 0-15 units insulin aspart TID with meals and 0-5 units insulin aspart q HS).   Diet Order:   Diet Order            DIET DYS 2 Room service appropriate? Yes; Fluid  consistency: Honey Thick  Diet effective now              EDUCATION NEEDS:   Not appropriate for education at this time  Skin:  Skin Assessment: Reviewed RN Assessment  Last BM:  04/18/19  Height:   Ht Readings from Last 1 Encounters:  04/19/19 _0  (1.626 m)    Weight:   Wt Readings from Last 1 Encounters:  04/22/19 46.6 kg    Ideal Body Weight:  54.4 kg  BMI:  Body mass index is 17.63 kg/m.  Estimated Nutritional Needs:   Kcal:  1550-1750  Protein:  85-100 grams  Fluid:  > 1.5 L    Loistine Chance, RD, LDN, [place] Registered Dietitian II Certified Diabetes Care and Education Specialist Please refer to [facility] for RD and/or RD on-call/weekend/after hours pager

## 2019-04-22 NOTE — Progress Notes (Signed)
The chaplain attended a family meeting to support the palliative care team. The family asked the chaplain to pray for and with them. The chaplain is available if needed for further support.  Brion Aliment Chaplain Resident For questions concerning this note please contact me by pager [phone]

## 2019-04-22 NOTE — Progress Notes (Signed)
Patient is wearing a NRB mask and does not want to take it off for her breathing treatment. She is sating 100% and refuses to be weaned.

## 2019-04-22 NOTE — Progress Notes (Signed)
PROGRESS NOTE    Denise [name]  UKG:[id number] DOB: 09-13-1945 DOA: 04/19/2019 PCP: [name], NP     Brief Narrative:  74 y.o. BF PMHx essential HTN, HLD, DM type II controlled with complication, Chronic Respiratory Failure with hypoxia on 5 L O2 at home, COPD/Severe Emphysema, pulmonary HTN, Chronic Diastolic CHF, ischemic stroke s/p TPA-currently on Xarelto, protein calorie malnutrition,   Presents to emergency department due to worsening shortness of breath since couple of days.  Patient tells me that she has worsening shortness of breath since couple of days.  Has been feeling unwell.  She denies association with wheezing, cough, congestion, sputum production, fever, chills, leg swelling, orthopnea, PND, chest pain, headache, blurry vision, abdominal pain, decreased appetite or bowel changes.  Reports increased urinary frequency and strong smell since couple of days and associated with lower abdominal pain.  She denies nausea, vomiting, dysuria, hematuria.  She lives with her husband at home.  No history of smoking, alcohol, illicit drug use.  She has home health coming out to the house several days a week.  Upon arrival of EMS: She noted to have hypoxia in 60s she placed on oxygen mask by EMS.  ED Course: Upon arrival to ED: Patient tachypneic, afebrile with WBC of 3.9, lactic acid 2.8 trended up to 3.1, troponin: 34, UA positive for nitrates and leukocytes, POC COVID-19 negative.  She is requiring 6 L of oxygen via nasal cannula in ED.  She received loading dose of IV Solu-Medrol, IV fluid bolus and albuterol breathing treatment in ED.  She also received IV Rocephin for UTI.  [facility] hospitalist consulted for admission for acute on chronic hypoxemic respiratory failure.    Subjective: 4/20 afebrile overnight A/O x4, EXTREMELY SOB, tachypneic, tachycardic, negative CP, cachectic   Assessment & Plan:   Active Problems:   COPD (chronic obstructive pulmonary  disease) ([name])   Anxiety and depression   Hyperlipidemia   Essential hypertension   Acute respiratory failure with hypoxia ([name])   Pulmonary hypertension ([name]) c/w cor pulmonale   Type 2 diabetes mellitus ([name])   Elevated troponin   Lactic acidosis   GERD (gastroesophageal reflux disease)   Acute on chronic respiratory failure with hypercapnia ([name])   History of ischemic stroke   Acute on chronic respiratory failure with hypoxia ([name])   Chronic diastolic CHF (congestive heart failure) ([name])   UTI (urinary tract infection)   Leukopenia   CKD (chronic kidney disease), stage III   Acute on chronic respiratory failure with hypoxemia ([name])   Emphysema lung ([name])   Sepsis due to Escherichia coli (E. coli) ([place])   DNR (do not resuscitate)   Palliative care by specialist  Acute on chronic respiratory failure with hypoxia/COPD exacerbation/Bibasilar emphysema -Setting of most likely end-stage emphysema/COPD. -Solu-Medrol 60 mg  BID -DuoNeb QID -Morphine 1 to 2 mg PRN S OB -Continue 7-day course antibiotics -Titrate O2 to maintain SPO2> 88% -4/18 D-dimer negative  -4/19 PCXR; flattened diaphragms consistent with COPD infiltrates see results below -4/20 review of patient's previous studies shows bibasilar emphysema on CT abdomen and pelvis W contrast on 2/23.   -4/20 CT chest; severe centrilobular emphysema, lung nodule pathologic in size, consolidation see the results below  -4/20 echocardiogram findings when combined with patient's CT findings consistent with terminal phase emphysema.  Sepsis -On admission patient meets criteria for sepsis.  WBC<4K, RR> 20, site of infection lungs/renal. -D5-0.9% saline 176m/hr -See respiratory failure  UTI positive E. coli -Will be covered by antibiotics  for COPD exacerbation  Lactic acidosis: -Multifactorial sepsis, dehydration. -See sepsis  Chronic diastolic CHF/severe LVH/severe PASP  -Strict ins and out +2.9 L -Daily weight Filed  Weights   04/19/19 1633 04/22/19 0500  Weight: 39 kg 46.6 kg  -Echocardiogram findings shows severe cardiac compromise see results below -Hold diuretics -Metoprolol IV 5 mg QID -Hydralazine IV 7 mg q 4hr  Atrial fibrillation RVR -4/20 patient no longer rate controlled -4/20 Metoprolol IV 5 mg x 1 -4/20 Cardizem drip -4/20 once patient's heart rate under control will obtain new echocardiogram  Essential HTN -see CHF  Elevated troponin - first troponin 34. -Negative CP -Trend troponin Results for TAYRA, DAWE (MRN [id number]) as of 04/21/2019 13:54  Ref. Range 04/19/2019 14:54 04/20/2019 19:46 04/21/2019 00:19 04/21/2019 03:43  Troponin I (High Sensitivity) Latest Ref Range: <18 ng/L 34 (H) 39 (H) 40 (H) 30 (H)  -EKG no ST-T wave changes noted.  DM type II controlled with complication -Moderate SSI  Hx ischemic stroke -Patient A/O x4 day.  Severe protein calorie malnutrition/failure to thrive -See dysphagia  Dysphagia oral pharyngeal phase -4/19 barium swallow evaluation; stage II, honey thick liquids  HLD -Statin on hold  CKD stage IIIa (baseline Cr ~1.21) Recent Labs  Lab 04/19/19 1215 04/19/19 1217 04/20/19 0248 04/21/19 0019 04/22/19 0125  CREATININE 1.10* 1.15* 1.01* 0.85 0.82  -Better than baseline with hydration  Leukopenia   Goals of care -4/18 Palliative Care Consult; patient with dementia, severe emphysema/COPD severe protein calorie malnutrition.  Evaluate for change to DNR status, palliative care services at home when ready for discharge -4/20 met with husband and sister have decided on home with hospice.  Discharge on 4/21 home if all equipment in place. .  DVT prophylaxis: Lovenox Code Status: Full Family Communication:  Disposition Plan: TBD 1.  Where the patient is from 2.  Anticipated d/c place. 3.  Barriers to d/c await palliative care recommendations   Consultants:  Palliative care  Procedures/Significant Events:  4/19  PCXR;Streaky left basilar density, likely atelectasis 4/20 CT chest W0 contrast;-moderate to severe centrilobular emphysema.  -Small left pleural effusion associated atelectasis or consolidation, slightly increased compared to prior examination. -NEW, nonspecific nodular opacity of the right pulmonary apex measuring 1.1 cm, suspicious for malignancy although given rapid development from prior dated 10/29/2018 could reflect infection or inflammation.  -Cardiomegaly and coronary artery disease. -Enlargement of the main pulmonary artery, as can be seen in pulmonary hypertension. 4/20 Echocardiogram;Left Ventricle: LVEF= 60 to 65%.  -Severe LVH  -Grade I diastolic dysfunction (impaired relaxation).  Right Ventricle: moderately enlarged.Ventricular systolic function is severely reduced.  -severely elevated PASP 102.6 mmHg.  Right Atrium: moderately dilated.  Pericardium: A small pericardial effusion is present.    I have personally reviewed and interpreted all radiology studies and my findings are as above.  VENTILATOR SETTINGS: HFNC 4/20 Flow; 15 L/min SPO2 93% RR; 35   4/19 VBG pH 7.35 PCO2 50.1 PO2<31   Cultures 4/17 urine positive E. coli 4/17 blood LEFT forearm NGTD 4/17 blood RIGHT wrist NGTD   Antimicrobials: Anti-infectives (From admission, onward)   Start     Dose/Rate Stop   04/20/19 1600  cefTRIAXone (ROCEPHIN) 1 g in sodium chloride 0.9 % 100 mL IVPB     1 g 200 mL/hr over 30 Minutes     04/19/19 1730  azithromycin (ZITHROMAX) 500 mg in sodium chloride 0.9 % 250 mL IVPB     500 mg 250 mL/hr over 60 Minutes  04/19/19 1600  cefTRIAXone (ROCEPHIN) 1 g in sodium chloride 0.9 % 100 mL IVPB     1 g 200 mL/hr over 30 Minutes 04/19/19 1640       Devices    LINES / TUBES:      Continuous Infusions: . azithromycin 500 mg (04/21/19 2001)  . cefTRIAXone (ROCEPHIN)  IV 1 g (04/21/19 1915)  . dextrose 5 % and 0.9% NaCl 100 mL/hr at 04/22/19 0455      Objective: Vitals:   04/22/19 0500 04/22/19 0511 04/22/19 0720 04/22/19 0812  BP:  138/76  130/84  Pulse:  78    Resp:      Temp:  (!) 97.4 F (36.3 C)    TempSrc:  Axillary    SpO2:  97% 100%   Weight: 46.6 kg     Height:        Intake/Output Summary (Last 24 hours) at 04/22/2019 0839 Last data filed at 04/21/2019 1800 Gross per 24 hour  Intake 863.33 ml  Output 550 ml  Net 313.33 ml   Filed Weights   04/19/19 1633 04/22/19 0500  Weight: 39 kg 46.6 kg   Physical Exam:  General: A/O x4, positive acute on chronic respiratory distress, cachectic Eyes: negative scleral hemorrhage, negative anisocoria, negative icterus ENT: Negative Runny nose, negative gingival bleeding, Neck:  Negative scars, masses, torticollis, lymphadenopathy, JVD Lungs: Tachypneic, diffuse poor air movement  bilaterally without wheezes or crackles Cardiovascular: Irregularly irregular rhythm and rate without murmur gallop or rub normal S1 and S2 Abdomen: negative abdominal pain, nondistended, positive soft, bowel sounds, no rebound, no ascites, no appreciable mass Extremities: No significant cyanosis, clubbing, or edema bilateral lower extremities Skin: Negative rashes, lesions, ulcers Psychiatric:  Negative depression, negative anxiety, negative fatigue, negative mania EXTREMELY POOR understanding of her underlying disease process Central nervous system:  Cranial nerves II through XII intact, tongue/uvula midline, all extremities muscle strength 5/5, sensation intact throughout, negative dysarthria, negative expressive aphasia, negative receptive aphasia.   .     Data Reviewed: Care during the described time interval was provided by me .  I have reviewed this patient's available data, including medical history, events of note, physical examination, and all test results as part of my evaluation.  CBC: Recent Labs  Lab 04/19/19 1217 04/19/19 1251 04/20/19 0248 04/21/19 0019 04/22/19 0125   WBC 3.9*  --  3.3* 4.5 7.4  NEUTROABS 2.8  --   --   --   --   HGB 13.2 12.2 12.5 10.9* 12.9  HCT 43.9 36.0 40.7 35.1* 41.4  MCV 88.5  --  87.0 87.1 87.0  PLT 175  --  192 162 833   Basic Metabolic Panel: Recent Labs  Lab 04/19/19 1215 04/19/19 1215 04/19/19 1217 04/19/19 1251 04/19/19 1454 04/20/19 0248 04/21/19 0019 04/22/19 0125  NA 142   < > 143 141  --  144 145 146*  K 4.2   < > 5.0 3.8  --  4.5 4.4 4.0  CL 103  --  103  --   --  108 111 110  CO2  --   --  30  --   --  _0 GLUCOSE 204*  --  224*  --   --  183* 240* 197*  BUN 25*  --  22  --   --  22 23 30*  CREATININE 1.10*  --  1.15*  --   --  1.01* 0.85 0.82  CALCIUM  --   --  11.1*  --   --  10.8* 10.8* 11.3*  MG  --   --   --   --  1.8  --  1.7 1.8  PHOS  --   --   --   --   --   --  3.3 2.8   < > = values in this interval not displayed.   GFR: Estimated Creatinine Clearance: 45 mL/min (by C-G formula based on SCr of 0.82 mg/dL). Liver Function Tests: Recent Labs  Lab 04/19/19 1217 04/20/19 0248 04/21/19 0019 04/22/19 0125  AST 35 _0 ALT _1 ALKPHOS 96 84 69 73  BILITOT 1.0 0.8 0.7 0.5  PROT 6.8 6.4* 5.8* 6.0*  ALBUMIN 3.9 3.7 3.6 3.7   No results for input(s): LIPASE, AMYLASE in the last 168 hours. No results for input(s): AMMONIA in the last 168 hours. Coagulation Profile: Recent Labs  Lab 04/19/19 1217  INR 2.0*   Cardiac Enzymes: No results for input(s): CKTOTAL, CKMB, CKMBINDEX, TROPONINI in the last 168 hours. BNP (last 3 results) Recent Labs    05/02/18 1249 06/24/18 1111  PROBNP 910.0* 840.0*   HbA1C: No results for input(s): HGBA1C in the last 72 hours. CBG: Recent Labs  Lab 04/21/19 0754 04/21/19 1119 04/21/19 1502 04/21/19 2119 04/22/19 0815  GLUCAP 152* 133* 161* 161* 208*   Lipid Profile: No results for input(s): CHOL, HDL, LDLCALC, TRIG, CHOLHDL, LDLDIRECT in the last 72 hours. Thyroid Function Tests: No results for input(s): TSH, T4TOTAL,  FREET4, T3FREE, THYROIDAB in the last 72 hours. Anemia Panel: No results for input(s): VITAMINB12, FOLATE, FERRITIN, TIBC, IRON, RETICCTPCT in the last 72 hours. Sepsis Labs: Recent Labs  Lab 04/19/19 1217 04/19/19 1449  LATICACIDVEN 2.8* 3.1*    Recent Results (from the past 240 hour(s))  Blood Culture (routine x 2)     Status: None (Preliminary result)   Collection Time: 04/19/19 11:57 AM   Specimen: BLOOD LEFT FOREARM  Result Value Ref Range Status   Specimen Description BLOOD LEFT FOREARM  Final   Special Requests   Final    BOTTLES DRAWN AEROBIC AND ANAEROBIC Blood Culture results may not be optimal due to an inadequate volume of blood received in culture bottles   Culture   Final    NO GROWTH 2 DAYS Performed at [facility], [place] [address] [place], [place] [address]    Report Status PENDING  Incomplete  Blood Culture (routine x 2)     Status: None (Preliminary result)   Collection Time: 04/19/19 11:58 AM   Specimen: BLOOD RIGHT WRIST  Result Value Ref Range Status   Specimen Description BLOOD RIGHT WRIST  Final   Special Requests   Final    BOTTLES DRAWN AEROBIC ONLY Blood Culture results may not be optimal due to an inadequate volume of blood received in culture bottles   Culture   Final    NO GROWTH 2 DAYS Performed at [facility], [place] [address] [place], [place] [address]    Report Status PENDING  Incomplete  Urine culture     Status: Abnormal   Collection Time: 04/19/19  2:49 PM   Specimen: In/Out Cath Urine  Result Value Ref Range Status   Specimen Description IN/OUT CATH URINE  Final   Special Requests   Final    NONE Performed at [facility], [place] [address], [place], [place] [address]    Culture >=100,000 COLONIES/mL ESCHERICHIA COLI (A)  Final   Report Status 04/21/2019 FINAL  Final   Organism ID, Bacteria ESCHERICHIA COLI (A)  Final      Susceptibility   Escherichia coli - MIC*    AMPICILLIN >=32 RESISTANT Resistant      CEFAZOLIN <=4 SENSITIVE Sensitive     CEFTRIAXONE <=0.25 SENSITIVE Sensitive     CIPROFLOXACIN >=4 RESISTANT Resistant     GENTAMICIN >=16 RESISTANT Resistant     IMIPENEM <=0.25 SENSITIVE Sensitive     NITROFURANTOIN <=16 SENSITIVE Sensitive     TRIMETH/SULFA >=320 RESISTANT Resistant     AMPICILLIN/SULBACTAM >=32 RESISTANT Resistant     PIP/TAZO <=4 SENSITIVE Sensitive     * >=100,000 COLONIES/mL ESCHERICHIA COLI         Radiology Studies: DG CHEST PORT 1 VIEW  Result Date: 04/21/2019 CLINICAL DATA:  Shortness of breath. EXAM: PORTABLE CHEST 1 VIEW COMPARISON:  04/19/2019 FINDINGS: The cardiac silhouette, mediastinal and hilar contours are stable. Streaky left basilar density, likely atelectasis. No definite infiltrates or effusions. Clothing artifact overlying the right chest. No pneumothorax. IMPRESSION: Streaky left basilar density, likely atelectasis. Electronically Signed   By: Marijo Sanes M.D.   On: 04/21/2019 06:09   DG Swallowing Func-Speech Pathology  Result Date: 04/21/2019 Objective Swallowing Evaluation: Type of Study: MBS-Modified Barium Swallow Study  Patient Details Name: GROVER WOODFIELD MRN: [id number] Date of Birth: 1945/05/21 Today's Date: 04/21/2019 Time: SLP Start Time (ACUTE ONLY): 1308 -SLP Stop Time (ACUTE ONLY): 1324 SLP Time Calculation (min) (ACUTE ONLY): 16 min Past Medical History: Past Medical History: Diagnosis Date . Acute blood loss anemia  . Acute ischemic right MCA stroke ([place]) 11/27/2018 . Acute on chronic diastolic CHF (congestive heart failure) ([place]) 12/30/2018 . Acute on chronic respiratory failure with hypercapnia ([place]) 10/28/2018 . Acute on chronic respiratory failure with hypoxia ([place]) 12/30/2018 . Altered mental status  . Anxiety and depression  . Borderline abnormal TFTs 05/02/2018 . Cerebellar stroke, acute ([place]) 11/08/2018 . CHF (congestive heart failure) ([place]) 05/16/2018 . Chronic respiratory failure with hypoxia ([place]) 10/02/2017  Assoc with ?  Cor pulmonale dx 09/2017 so rx 02 1-2 lpm 24/7  - 10/17/2017  Walked RA x 3 laps @ 185 ft each stopped due to  End of study, nl pace,  desat to 87 on 3rd lap - 12/19/2017  Saturations on Room Air at Rest =91 % and while Ambulating = 87%  But  on  2 Liters of pulsed oxygen while Ambulating =93% so rec POC 2lpm walking / use at rest if sats under 90%     . Chronic respiratory failure with hypoxia and hypercapnia ([place]) 05/03/2018  Assoc with   Cor pulmonale dx 09/2017 so rx 02 1-2 lpm 24/7  - 10/17/2017  Walked RA x 3 laps @ 185 ft each stopped due to  End of study, nl pace,  desat to 87 on 3rd lap - 12/19/2017  Saturations on Room Air at Rest =91 % and while Ambulating = 87%  But  on  2 Liters of pulsed oxygen while Ambulating =93% so rec POC 2lpm walking / use at rest if sats under 90%  - HC03 05/02/2018  = 34  - HC03 11/16 . Colon cancer ([place]) 1988  Resected . Constipation  . COPD (chronic obstructive pulmonary disease) ([place])  . COPD GOLD  II  spirometry, severe Emphysema and 02 dep 11/22/2015  Quit smoking 1989 - Spirometry 09/11/2016  FEV1 1.03 (52%)  Ratio 54 mild curvature, on no rx - 09/11/2016  After  extensive coaching HFA effectiveness =    50% from baseline 10% > continue  symb 160 2bid but consider change to bevespi or stiolto  - 10/02/2017  After extensive coaching inhaler device,  effectiveness =    50% from a baseline near 0 > rechallenge with symbicort 160 x 2 bid x 2 weeks the . COPD with acute exacerbation ([place]) 10/28/2018 . Diabetes mellitus without complication ([name])   diet controlled- no meds. per pt . Diarrhea 04/02/2018 . Diverticulosis  . Elevated troponin 10/28/2018 . Essential hypertension 11/22/2015 . GERD (gastroesophageal reflux disease) 10/28/2018 . Hematuria 09/27/2017 . History of colon cancer 06/11/1986  Resected 1988, [place] . Hx of colonic polyps 02/10/2013 . Hyperlipidemia  . Hyperparathyroidism, primary ([place]) 07/27/2016 . Hypertension  . Hypertensive urgency 11/08/2018 . Hypoxia  10/30/2018 . ICH (intracerebral hemorrhage) ([place]) s/p tPA administration 11/08/2018 . Insomnia  . Lactic acidosis 10/28/2018 . Loss of appetite 06/02/2016 . Lower extremity edema 02/20/2018 . On home oxygen therapy 07/12/2018 . Primary hyperparathyroidism ([place])  . Protein-calorie malnutrition, severe 09/17/2017 . Pulmonary hypertension ([name]) c/w cor pulmonale   See RHC 09/20/17  With  low CO ? Accuracy of PVR  - tyvaso d/c'd 10/03/18 by [facility] due to cough, improved    . SBO (small bowel obstruction) ([place])  . Stroke ([place]) 10/2018  tpa administered . Supplemental oxygen dependent  . Tubular adenoma of rectum 02/23/2013  low grade . Type 2 diabetes mellitus ([place]) 04/26/2018 . Unintended weight loss 04/26/2018 . UTI (urinary tract infection) 03/05/2019 . Vaginal candidiasis  . Vitamin D deficiency  Past Surgical History: Past Surgical History: Procedure Laterality Date . ABDOMINAL HYSTERECTOMY  08/2015 . BREAST BIOPSY Left  . BREAST EXCISIONAL BIOPSY Right  . COLON RESECTION  1980's . COLONOSCOPY   . ENDOMETRIAL BIOPSY  2009  negative . INCONTINENCE SURGERY  2017 . LOOP RECORDER INSERTION N/A 11/08/2018  Procedure: LOOP RECORDER INSERTION;  Surgeon: Constance Haw, MD;  Location: [facility];  Service: Cardiovascular;  Laterality: N/A; . RIGHT HEART CATH N/A 09/20/2017  Procedure: RIGHT HEART CATH;  Surgeon: Larey Dresser, MD;  Location: [facility];  Service: Cardiovascular;  Laterality: N/A; . RIGHT/LEFT HEART CATH AND CORONARY ANGIOGRAPHY N/A 03/19/2018  Procedure: RIGHT/LEFT HEART CATH AND CORONARY ANGIOGRAPHY;  Surgeon: Larey Dresser, MD;  Location: [facility];  Service: Cardiovascular;  Laterality: N/A; HPI: Pt is a 74 y.o. female with medical history significant of hypertension, hyperlipidemia, type 2 diabetes mellitus, chronic hypoxemic respiratory failure due to underlying COPD-on  5 L of oxygen at home, pulmonary hypertension, ischemic stroke s/p TPA-currently on Xarelto, protein calorie  malnutrition, grade 1 diastolic congestive heart failure, who presented to emergency department due to worsening shortness of breath. CXR was negative for acute cardiopulmonary disease. MBS 11/29/18: decreased bolus organization, premature spillage of thin liquids with trace aspiration before laryngeal-vestibular closure. Nectars were noted to penetrate the larynx, but were not aspirated.  There was minimal residue post-swallow.  Aspiration was accompanied by a mild cough.  Subjective: Pt was alert and tachypneic Assessment / Plan / Recommendation CHL IP CLINICAL IMPRESSIONS 04/21/2019 Clinical Impression Pt was seen for a modified barium swallow study and she presents with mild oropharyngeal dysphagia with resultant laryngeal penetration of thin liquid and nectar-thick liquid on today's examination.  Laryngeal penetration occurred before the swallow secondary to delayed swallow initiation, and it was transient with thin liquid and nectar-thick liquid trials except for thin liquid via cup sip.  No aspiration  was observed with any trials and no laryngeal penetration was observed with honey-thick liquid, puree, or regular solid trials.  Of note, pt had tachypnea following thin liquid and nectar-thick liquid trials.  Oral phase was remarkable for prolonged mastication of regular solids, reduced lingual control resulting in premature spillage to the pharynx, and reduced lingual strength resulting in trace-mild oral residue.  Pharyngeal phase was remarkable for reduced hyolaryngeal elevation/excursion resulting in vallecular and pyriform residue, reduced BOT retraction resulting in pyriform residue, and reduced pharyngeal constriction resulting posterior pharyngeal wall residue.  Due to increased risk of aspiration with thin liquid and nectar-thick liquid given laryngeal penetration and tachypnea, recommend initiation of Dysphagia 2 (fine chop) solids and honey-thick liquids with medications administered whole in puree.   Pt may have ice chips and sips of water with full RN supervision before or 30+ minutes after a meal following thorough oral care.  If pt/family wish to pursue a more liberalized diet with known risk of aspiration, would recommend soft/regular solids and thin liquids with use of compensatory strategies.   SLP Visit Diagnosis Dysphagia, oropharyngeal phase (R13.12) Attention and concentration deficit following -- Frontal lobe and executive function deficit following -- Impact on safety and function Mild aspiration risk;Moderate aspiration risk   CHL IP TREATMENT RECOMMENDATION 04/21/2019 Treatment Recommendations Therapy as outlined in treatment plan below   Prognosis 04/21/2019 Prognosis for Safe Diet Advancement Fair Barriers to Reach Goals Time post onset;Cognitive deficits Barriers/Prognosis Comment -- CHL IP DIET RECOMMENDATION 04/21/2019 SLP Diet Recommendations Dysphagia 2 (Fine chop) solids;Honey thick liquids;Free water protocol after oral care;Ice chips PRN after oral care Liquid Administration via Cup;Straw Medication Administration Whole meds with puree Compensations Minimize environmental distractions;Slow rate;Small sips/bites Postural Changes Seated upright at 90 degrees   CHL IP OTHER RECOMMENDATIONS 04/21/2019 Recommended Consults -- Oral Care Recommendations Oral care BID;Oral care before and after PO Other Recommendations Prohibited food (jello, ice cream, thin soups);Order thickener from pharmacy   CHL IP FOLLOW UP RECOMMENDATIONS 04/21/2019 Follow up Recommendations (No Data)   CHL IP FREQUENCY AND DURATION 04/21/2019 Speech Therapy Frequency (ACUTE ONLY) min 2x/week Treatment Duration 2 weeks      CHL IP ORAL PHASE 04/21/2019 Oral Phase Impaired Oral - Pudding Teaspoon -- Oral - Pudding Cup -- Oral - Honey Teaspoon -- Oral - Honey Cup Lingual/palatal residue;Piecemeal swallowing Oral - Nectar Teaspoon -- Oral - Nectar Cup Lingual/palatal residue Oral - Nectar Straw Lingual/palatal residue Oral - Thin  Teaspoon -- Oral - Thin Cup Lingual/palatal residue Oral - Thin Straw Lingual/palatal residue;Piecemeal swallowing Oral - Puree Delayed oral transit;Lingual/palatal residue Oral - Mech Soft -- Oral - Regular Delayed oral transit;Lingual/palatal residue;Impaired mastication Oral - Multi-Consistency -- Oral - Pill -- Oral Phase - Comment --  CHL IP PHARYNGEAL PHASE 04/21/2019 Pharyngeal Phase Impaired Pharyngeal- Pudding Teaspoon -- Pharyngeal -- Pharyngeal- Pudding Cup -- Pharyngeal -- Pharyngeal- Honey Teaspoon -- Pharyngeal -- Pharyngeal- Honey Cup Delayed swallow initiation-pyriform sinuses;Pharyngeal residue - valleculae;Reduced tongue base retraction;Reduced laryngeal elevation;Reduced anterior laryngeal mobility Pharyngeal Material does not enter airway Pharyngeal- Nectar Teaspoon -- Pharyngeal -- Pharyngeal- Nectar Cup Delayed swallow initiation-pyriform sinuses;Pharyngeal residue - valleculae;Pharyngeal residue - posterior pharnyx;Reduced pharyngeal peristalsis;Reduced anterior laryngeal mobility;Reduced laryngeal elevation;Reduced tongue base retraction;Reduced airway/laryngeal closure;Penetration/Aspiration before swallow Pharyngeal Material enters airway, remains ABOVE vocal cords then ejected out Pharyngeal- Nectar Straw Delayed swallow initiation-pyriform sinuses;Reduced pharyngeal peristalsis;Reduced anterior laryngeal mobility;Reduced laryngeal elevation;Reduced airway/laryngeal closure;Reduced tongue base retraction;Penetration/Aspiration before swallow;Pharyngeal residue - valleculae;Pharyngeal residue - posterior pharnyx Pharyngeal Material enters airway, remains ABOVE vocal cords  then ejected out Pharyngeal- Thin Teaspoon -- Pharyngeal -- Pharyngeal- Thin Cup Delayed swallow initiation-pyriform sinuses;Pharyngeal residue - pyriform;Pharyngeal residue - valleculae;Reduced anterior laryngeal mobility;Reduced laryngeal elevation;Reduced airway/laryngeal closure;Reduced tongue base  retraction;Penetration/Aspiration before swallow Pharyngeal Material enters airway, remains ABOVE vocal cords and not ejected out Pharyngeal- Thin Straw Delayed swallow initiation-pyriform sinuses;Reduced pharyngeal peristalsis;Reduced anterior laryngeal mobility;Reduced laryngeal elevation;Reduced airway/laryngeal closure;Reduced tongue base retraction;Penetration/Aspiration before swallow Pharyngeal Material enters airway, remains ABOVE vocal cords then ejected out Pharyngeal- Puree Pharyngeal residue - valleculae;Reduced anterior laryngeal mobility;Reduced laryngeal elevation;Reduced tongue base retraction Pharyngeal Material does not enter airway Pharyngeal- Mechanical Soft -- Pharyngeal -- Pharyngeal- Regular Delayed swallow initiation-vallecula;Reduced laryngeal elevation;Reduced anterior laryngeal mobility Pharyngeal Material does not enter airway Pharyngeal- Multi-consistency -- Pharyngeal -- Pharyngeal- Pill -- Pharyngeal -- Pharyngeal Comment --  No flowsheet data found. Colin Mulders., M.S., [facility] Office: [phone] [place] 04/21/2019, 3:46 PM                   Scheduled Meds: . enoxaparin (LOVENOX) injection  20 mg Subcutaneous Q24H  . hydrALAZINE  7 mg Intravenous Q4H  . insulin aspart  0-15 Units Subcutaneous TID WC  . insulin aspart  0-5 Units Subcutaneous QHS  . ipratropium-albuterol  3 mL Nebulization BID  . LORazepam      . methylPREDNISolone (SOLU-MEDROL) injection  60 mg Intravenous Q12H  . metoprolol tartrate  5 mg Intravenous Q6H   Continuous Infusions: . azithromycin 500 mg (04/21/19 2001)  . cefTRIAXone (ROCEPHIN)  IV 1 g (04/21/19 1915)  . dextrose 5 % and 0.9% NaCl 100 mL/hr at 04/22/19 0455     LOS: 3 days    Time spent:40 min    Paton Crum, Geraldo Docker, MD [facility] Pager [phone]  If 7PM-7AM, please contact night-coverage [email or web address] Password [facility] 04/22/2019, 8:39 AM

## 2019-04-22 NOTE — Progress Notes (Signed)
Pt found at end of bed with oxygen on the floor. Pt is trying to get up from bed. Pt replaced in bed. Oxygen placed back on pt's face and morphine given for air hunger. Pt given instructions on energy conservation and breathing.

## 2019-04-22 NOTE — Progress Notes (Signed)
  Echocardiogram 2D Echocardiogram has been performed.  [name] A [name] 04/22/2019, 1:40 PM

## 2019-04-22 NOTE — Telephone Encounter (Signed)
Per message below, left detail message of [name] comments

## 2019-04-22 NOTE — Progress Notes (Addendum)
Pt stating she is supposed to be discharged today. When pt is instructed that there are no plans for discharge today pt starts becoming very anxious stating "help me. I can't breathe" This nurse gives pt morphine for air hunger. Pt relaxes for about an hour before sitting at the edge of the bed stating again "I am going home today" and trying to get up from the bed. This nurse notified provider on call and received orders.

## 2019-04-23 DIAGNOSIS — N179 Acute kidney failure, unspecified: Secondary | ICD-10-CM

## 2019-04-23 DIAGNOSIS — R6520 Severe sepsis without septic shock: Secondary | ICD-10-CM

## 2019-04-23 DIAGNOSIS — N3000 Acute cystitis without hematuria: Secondary | ICD-10-CM

## 2019-04-23 DIAGNOSIS — R0609 Other forms of dyspnea: Secondary | ICD-10-CM

## 2019-04-23 LAB — CBC
HCT: 37 % (ref 36.0–46.0)
Hemoglobin: 11.1 g/dL — ABNORMAL LOW (ref 12.0–15.0)
MCH: 26.6 pg (ref 26.0–34.0)
MCHC: 30 g/dL (ref 30.0–36.0)
MCV: 88.5 fL (ref 80.0–100.0)
Platelets: 151 10*3/uL (ref 150–400)
RBC: 4.18 MIL/uL (ref 3.87–5.11)
RDW: 15.1 % (ref 11.5–15.5)
WBC: 6.4 10*3/uL (ref 4.0–10.5)
nRBC: 0 % (ref 0.0–0.2)

## 2019-04-23 LAB — COMPREHENSIVE METABOLIC PANEL
ALT: 23 U/L (ref 0–44)
AST: 35 U/L (ref 15–41)
Albumin: 3.4 g/dL — ABNORMAL LOW (ref 3.5–5.0)
Alkaline Phosphatase: 63 U/L (ref 38–126)
Anion gap: 12 (ref 5–15)
BUN: 34 mg/dL — ABNORMAL HIGH (ref 8–23)
CO2: 18 mmol/L — ABNORMAL LOW (ref 22–32)
Calcium: 11.3 mg/dL — ABNORMAL HIGH (ref 8.9–10.3)
Chloride: 119 mmol/L — ABNORMAL HIGH (ref 98–111)
Creatinine, Ser: 0.94 mg/dL (ref 0.44–1.00)
GFR calc Af Amer: >60 mL/min (ref >60)
GFR calc non Af Amer: >60 mL/min (ref >60)
Glucose, Bld: 136 mg/dL — ABNORMAL HIGH (ref 70–99)
Potassium: 3.9 mmol/L (ref 3.5–5.1)
Sodium: 149 mmol/L — ABNORMAL HIGH (ref 135–145)
Total Bilirubin: 0.5 mg/dL (ref 0.3–1.2)
Total Protein: 5.6 g/dL — ABNORMAL LOW (ref 6.5–8.1)

## 2019-04-23 LAB — GLUCOSE, CAPILLARY
Glucose-Capillary: 159 mg/dL — ABNORMAL HIGH (ref 70–99)
Glucose-Capillary: 160 mg/dL — ABNORMAL HIGH (ref 70–99)
Glucose-Capillary: 191 mg/dL — ABNORMAL HIGH (ref 70–99)

## 2019-04-23 LAB — PHOSPHORUS: Phosphorus: 2.6 mg/dL (ref 2.5–4.6)

## 2019-04-23 LAB — MAGNESIUM: Magnesium: 1.9 mg/dL (ref 1.7–2.4)

## 2019-04-23 MED ORDER — ACETAMINOPHEN 325 MG PO TABS: 650.0000 mg | ORAL_TABLET | Freq: Four times a day (QID) | ORAL | 0 refills | Status: AC | PRN

## 2019-04-23 MED ORDER — MORPHINE SULFATE (CONCENTRATE) 10 MG/0.5ML PO SOLN: 5.0000 mg | ORAL | 0 refills | Status: AC | PRN

## 2019-04-23 MED ORDER — MORPHINE SULFATE (CONCENTRATE) 10 MG/0.5ML PO SOLN
5.0000 mg | ORAL | Status: DC | PRN
  Administered 2019-04-23 (×2): 5 mg via ORAL
  Filled 2019-04-23 (×2): qty 0.5

## 2019-04-23 MED ORDER — LORAZEPAM 0.5 MG PO TABS: 0.5000 mg | ORAL_TABLET | Freq: Three times a day (TID) | ORAL | 0 refills | Status: AC | PRN

## 2019-04-23 MED ORDER — ONDANSETRON 8 MG PO TBDP: 8.0000 mg | ORAL_TABLET | Freq: Three times a day (TID) | ORAL | 0 refills | Status: AC | PRN

## 2019-04-23 NOTE — Progress Notes (Signed)
Patient ID: Denise [name], female   DOB: 1945-09-15, 74 y.o.   MRN: [id number]  This NP visited patient at the bedside as a follow up for palliative medicine needs and emotional support.   Sister at bedside.       Patient is weak, dyspnic on non-rebreather.  Family anticipates discharge home today   Educated family on hospice benefit at home.  Education family regarding the natural trajectory and expectations at EOL.  Education offered on medications utilized for dyspnea and importance of postioning.   Plan of Care: -DNR/DNI -comfort feeds/hydration as tolerated -home with hospice  -continue current medical interventions until discharge, once home with hospice support focus of care is comfort and dignity -avoid re-hosptialization - symptom management specific to dyspnea: Roxanol 5 mg po/sl every 2 hrs prn  Questions and concerns addressed     Discussed with hospice liaison  and CMRN for trantion of care.          Total time spent on the unit was 15 minutes  Greater than 50% of the time was spent in counseling and coordination of care  Wadie Lessen NP  Palliative Medicine Team Team Phone # [phone] Pager [phone]

## 2019-04-23 NOTE — Discharge Summary (Signed)
Physician Discharge Summary  Denise [name] IPJ:[id number] DOB: 1945/03/20 DOA: 04/19/2019  PCP: [name], NP  Admit date: 04/19/2019 Discharge date: 04/23/2019  Time spent: 35 minutes  Recommendations for Outpatient Follow-up:  1. No rehospitalization, unless level of comfort cannot be achieved at home and no bed available for residential hospice. 2. Comfort care and symptomatic management only   Discharge Diagnoses:  Active Problems:   COPD (chronic obstructive pulmonary disease) ([name])   Anxiety and depression   Hyperlipidemia   Essential hypertension   Acute respiratory failure with hypoxia ([name])   Pulmonary hypertension ([name]) c/w cor pulmonale   Type 2 diabetes mellitus ([name])   Elevated troponin   Lactic acidosis   GERD (gastroesophageal reflux disease)   Acute on chronic respiratory failure with hypercapnia ([name])   History of ischemic stroke   Acute on chronic respiratory failure with hypoxia ([name])   Chronic diastolic CHF (congestive heart failure) ([name])   UTI (urinary tract infection)   Leukopenia   CKD (chronic kidney disease), stage III   Acute on chronic respiratory failure with hypoxemia ([name])   Emphysema lung ([place])   Sepsis due to Escherichia coli (E. coli) ([place])   DNR (do not resuscitate)   Palliative care by specialist   Discharge Condition: Currently stable in no major distress.  Patient will be discharged home to be followed by hospice and focus on comfort care and symptomatic management.  No further hospitalization.  CODE STATUS: DNR/DNI.  Diet recommendation: Come for feeding. (Dysphagia 2 /soft diet recommended).  Filed Weights   04/19/19 1633 04/22/19 0500  Weight: 39 kg 46.6 kg    History of present illness:  74 y.o.BF PMHx essential HTN, HLD, DM type II controlled with complication, Chronic Respiratory Failure with hypoxia on 5 L O2 at home, COPD/Severe Emphysema, pulmonary HTN, Chronic Diastolic CHF, ischemic stroke s/p  TPA-currently on Xarelto, protein calorie malnutrition,   Presents to emergency department due to worsening shortness of breath since couple of days.  Patient tells me that she has worsening shortness of breath since couple of days. Has been feeling unwell. She denies association with wheezing, cough, congestion, sputum production, fever, chills, leg swelling, orthopnea, PND, chest pain, headache, blurry vision, abdominal pain, decreased appetite or bowel changes.  Reports increased urinary frequency and strong smell since couple of days and associated with lower abdominal pain. She denies nausea, vomiting, dysuria, hematuria.  She lives with her husband at home. No history of smoking, alcohol, illicit drug use. She has home health coming out to the house several days a week. Upon arrival of EMS: She noted to have hypoxia in 60s she placed on oxygen mask by EMS.  ED Course:Upon arrival to ED: Patient tachypneic, afebrile with WBC of 3.9, lactic acid 2.8 trended up to 3.1, troponin: 34, UA positive for nitrates and leukocytes, POC COVID-19 negative. She is requiring 6 L of oxygen via nasal cannula in ED. She received loading dose of IV Solu-Medrol, IV fluid bolus and albuterol breathing treatment in ED. She also received IV Rocephin for UTI.  [facility] consulted for admission for acute on chronic hypoxemic respiratory failure.   Hospital Course:  Acute on chronic respiratory failure with hypoxia/COPD exacerbation/bibasilar emphysema -Patient treated appropriately with the steroids, nebulizer management and antibiotics -Imaging studies ended demonstrating terminal phase emphysema and given overall decreased quality of life despite treatment after discussing goals of care with palliative care and family decision made to transition to full comfort care and symptomatic management only -Patient  will be discharged home to be followed by hospice, continue oxygen supplementation,  prescription for morphine and Ativan to assist with air hunger, agitation and comfort has been provided. -Continue as needed nebulizers -All other medications have been discontinued at this time.  Sepsis: In the setting of E. coli UTI and also bronchiectasis -Patient received antibiotic therapy and supportive control -Organ dysfunction (renal failure) present on admission resolved prior to discharge. -At this moment decision has been made to pursue just comfort care and symptomatic management -E. coli microorganism was resistant to ciprofloxacin, gentamicin, Bactrim and ampicillin.  Patient was treated with ceftriaxone -No further antibiotic therapy provided at time of discharge.  Lactic acidosis -In the setting of sepsis and dehydration -Fluid resuscitation provided -After transition to full comfort no further blood work to be pursued.  Elevated troponin -Patient denies chest pain -Appears to be most likely demand ischemia -EKG without acute ischemic changes -No telemetry abnormalities. -Plan of care currently symptomatic management and comfort only.  Atrial fibrillation with RVR -Prior to admission using Cardizem and metoprolol -She was also on Xarelto for secondary prevention -This medication has now been discontinued in the setting of comfort care and symptomatic management. -Patient's heart rate intermittently -But she denies palpitations.  Type 2 diabetes mellitus with nephropathy -While inpatient she was kept on sliding scale insulin -At discharge no hypoglycemic regimen will be pursued -Plan of care is symptomatic management and comfort.  History of nonhemorrhagic ischemic stroke -No new focal deficits appreciated. -Planning for comfort care only -Any medications not intended to provide symptomatic relief has been discontinued at discharge.  Chronic kidney disease a stage IIIa -Patient baseline creatinine 1.2 -After stopping the medication provided for resuscitation  her creatinine last check was better than baseline. -No further blood work anticipated -Planning for comfort care only.   Procedures: 4/19 PCXR;Streaky left basilar density, likely atelectasis 4/20 CT chest W0 contrast;-moderate to severe centrilobular emphysema.  -Small left pleural effusion associated atelectasis or consolidation, slightly increased compared to prior examination. -NEW, nonspecific nodular opacity of the right pulmonary apex measuring 1.1 cm, suspicious for malignancy although given rapid development from prior dated 10/29/2018 could reflect infection or inflammation.  -Cardiomegaly and coronary artery disease. -Enlargement of the main pulmonary artery, as can be seen in pulmonary hypertension. 4/20 Echocardiogram;Left Ventricle: LVEF= 60 to 65%.  -Severe LVH  -Grade I diastolic dysfunction (impaired relaxation).  Right Ventricle: moderately enlarged.Ventricular systolic function is severely reduced.  -severely elevated PASP 102.6 mmHg.  Right Atrium: moderately dilated.  Pericardium: A small pericardial effusion is present.   Consultations:  Palliative care  Hospice  Discharge Exam: Vitals:   04/23/19 1210 04/23/19 1216  BP:  138/82  Pulse: (!) 113 (!) 105  Resp:  (!) 27  Temp:    SpO2:      General: Frail, chronically ill and in no major distress currently.  Expressing some discomfort in her abdomen.  Currently afebrile.  Oxygen supplementation in place; vital signs stable. Cardiovascular: Mild sinus tachycardia, positive systolic murmur, no rubs, no gallops, no JVD on exam. Respiratory: Scattered rhonchi bilaterally; no using accessory muscles. Abdomen: Soft, no guarding, positive bowel sounds Extremities: No cyanosis or clubbing.  Discharge Instructions   Discharge Instructions    Discharge instructions   Complete by: As directed    No further rehospitalization Comfort care and symptomatic management Hospice to follow patient starting May 06, 2019.      Allergies as of 04/23/2019   No Known Allergies     Medication List  STOP taking these medications   Accu-Chek Aviva Plus w/Device Kit   Accu-Chek Aviva Soln   accu-chek soft touch lancets   atorvastatin 20 MG tablet Commonly known as: LIPITOR   bisoprolol 5 MG tablet Commonly known as: ZEBETA   Breo Ellipta 100-25 MCG/INH Aepb Generic drug: fluticasone furoate-vilanterol   furosemide 40 MG tablet Commonly known as: LASIX   glipiZIDE 5 MG tablet Commonly known as: GLUCOTROL   glucose blood test strip Commonly known as: Accu-Chek Aviva Plus   Klor-Con M10 10 MEQ tablet Generic drug: potassium chloride   losartan 50 MG tablet Commonly known as: COZAAR   metoprolol succinate 25 MG 24 hr tablet Commonly known as: TOPROL-XL   polyethylene glycol powder 17 GM/SCOOP powder Commonly known as: GLYCOLAX/MIRALAX   rivaroxaban 10 MG Tabs tablet Commonly known as: XARELTO   spironolactone 25 MG tablet Commonly known as: ALDACTONE   umeclidinium bromide 62.5 MCG/INH Aepb Commonly known as: INCRUSE ELLIPTA     TAKE these medications   acetaminophen 325 MG tablet Commonly known as: TYLENOL Take 2 tablets (650 mg total) by mouth every 6 (six) hours as needed for mild pain or headache (or Fever >/= 101).   albuterol 108 (90 Base) MCG/ACT inhaler Commonly known as: VENTOLIN HFA INHALE 1-2 PUFFS INTO THE LUNGS EVERY 4 HOURS AS NEEDED FOR WHEEZING OR SHORTNESS OF BREATH. What changed:   how much to take  how to take this  when to take this  reasons to take this  additional instructions   famotidine 40 MG tablet Commonly known as: PEPCID Take 1 tablet twice What changed:   how much to take  how to take this  when to take this  additional instructions   LORazepam 0.5 MG tablet Commonly known as: Ativan Take 1 tablet (0.5 mg total) by mouth every 8 (eight) hours as needed for anxiety (comfort).   mirtazapine 15 MG tablet Commonly known as:  REMERON TAKE 1 TABLET BY MOUTH AT BEDTIME. FOR DEPRESSION AND APPETITE. What changed:   how much to take  how to take this  when to take this  additional instructions   morphine CONCENTRATE 10 MG/0.5ML Soln concentrated solution Take 0.25 mLs (5 mg total) by mouth every 2 (two) hours as needed for moderate pain or shortness of breath.   ondansetron 8 MG disintegrating tablet Commonly known as: Zofran ODT Take 1 tablet (8 mg total) by mouth every 8 (eight) hours as needed for nausea or vomiting.   OXYGEN Inhale 4 L into the lungs continuous.      No Known Allergies   The results of significant diagnostics from this hospitalization (including imaging, microbiology, ancillary and laboratory) are listed below for reference.    Significant Diagnostic Studies: CT CHEST WO CONTRAST  Result Date: 04/22/2019 CLINICAL DATA:  Shortness of breath, evaluate for end-stage emphysema versus malignancy versus ILD EXAM: CT CHEST WITHOUT CONTRAST TECHNIQUE: Multidetector CT imaging of the chest was performed following the standard protocol without IV contrast. COMPARISON:  10/29/2018 FINDINGS: Cardiovascular: Aortic atherosclerosis. Cardiomegaly. Three-vessel coronary artery calcifications. Enlargement of the main pulmonary artery up to 3.5 cm. No pericardial effusion. Mediastinum/Nodes: No enlarged mediastinal, hilar, or axillary lymph nodes. Thyroid gland, trachea, and esophagus demonstrate no significant findings. Lungs/Pleura: Moderate to severe centrilobular emphysema. There is a new nodular opacity of the right pulmonary apex measuring 1.1 x 0.8 cm (series 6, image 31). Small left pleural effusion and associated atelectasis or consolidation, slightly increased compared to prior examination. Upper Abdomen: No  acute abnormality. Musculoskeletal: No chest wall mass or suspicious bone lesions identified. IMPRESSION: 1. Moderate to severe centrilobular emphysema. Emphysema (ICD10-J43.9). 2. Small left  pleural effusion associated atelectasis or consolidation, slightly increased compared to prior examination. 3. New, nonspecific nodular opacity of the right pulmonary apex measuring 1.1 cm, suspicious for malignancy although given rapid development from prior dated 10/29/2018 could reflect infection or inflammation. Recommend follow-up CT at 3 months to assess for stability or resolution. PET-CT may be further considered for metabolic characterization on a non urgent, non inpatient basis when clinically appropriate. 4.  Cardiomegaly and coronary artery disease. 5. Enlargement of the main pulmonary artery, as can be seen in pulmonary hypertension. 6.  Aortic Atherosclerosis (ICD10-I70.0). Electronically Signed   By: Eddie Candle M.D.   On: 04/22/2019 10:29   DG CHEST PORT 1 VIEW  Result Date: 04/21/2019 CLINICAL DATA:  Shortness of breath. EXAM: PORTABLE CHEST 1 VIEW COMPARISON:  04/19/2019 FINDINGS: The cardiac silhouette, mediastinal and hilar contours are stable. Streaky left basilar density, likely atelectasis. No definite infiltrates or effusions. Clothing artifact overlying the right chest. No pneumothorax. IMPRESSION: Streaky left basilar density, likely atelectasis. Electronically Signed   By: Marijo Sanes M.D.   On: 04/21/2019 06:09   DG Chest Port 1 View  Result Date: 04/19/2019 CLINICAL DATA:  Dyspnea, hypoxia, COPD EXAM: PORTABLE CHEST 1 VIEW COMPARISON:  12/30/2018 chest radiograph. FINDINGS: Loop recorder overlies the lower left chest. Stable cardiomediastinal silhouette with normal heart size. No pneumothorax. No pleural effusion. No pulmonary edema. No acute consolidative airspace disease. Emphysema. IMPRESSION: No acute cardiopulmonary disease.  Emphysema. Electronically Signed   By: Ilona Sorrel M.D.   On: 04/19/2019 12:09   DG Swallowing Func-Speech Pathology  Result Date: 04/21/2019 Objective Swallowing Evaluation: Type of Study: MBS-Modified Barium Swallow Study  Patient Details  Name: Denise [name] MRN: [id number] Date of Birth: 23-Jan-1945 Today's Date: 04/21/2019 Time: SLP Start Time (ACUTE ONLY): 1308 -SLP Stop Time (ACUTE ONLY): 1324 SLP Time Calculation (min) (ACUTE ONLY): 16 min Past Medical History: Past Medical History: Diagnosis Date . Acute blood loss anemia  . Acute ischemic right MCA stroke ([place]) 11/27/2018 . Acute on chronic diastolic CHF (congestive heart failure) ([place]) 12/30/2018 . Acute on chronic respiratory failure with hypercapnia ([place]) 10/28/2018 . Acute on chronic respiratory failure with hypoxia ([place]) 12/30/2018 . Altered mental status  . Anxiety and depression  . Borderline abnormal TFTs 05/02/2018 . Cerebellar stroke, acute ([place]) 11/08/2018 . CHF (congestive heart failure) ([place]) 05/16/2018 . Chronic respiratory failure with hypoxia ([place]) 10/02/2017  Assoc with ? Cor pulmonale dx 09/2017 so rx 02 1-2 lpm 24/7  - 10/17/2017  Walked RA x 3 laps @ 185 ft each stopped due to  End of study, nl pace,  desat to 87 on 3rd lap - 12/19/2017  Saturations on Room Air at Rest =91 % and while Ambulating = 87%  But  on  2 Liters of pulsed oxygen while Ambulating =93% so rec POC 2lpm walking / use at rest if sats under 90%     . Chronic respiratory failure with hypoxia and hypercapnia ([place]) 05/03/2018  Assoc with   Cor pulmonale dx 09/2017 so rx 02 1-2 lpm 24/7  - 10/17/2017  Walked RA x 3 laps @ 185 ft each stopped due to  End of study, nl pace,  desat to 87 on 3rd lap - 12/19/2017  Saturations on Room Air at Rest =91 % and while Ambulating = 87%  But  on  2  Liters of pulsed oxygen while Ambulating =93% so rec POC 2lpm walking / use at rest if sats under 90%  - HC03 05/02/2018  = 34  - HC03 11/16 . Colon cancer ([place]) 1988  Resected . Constipation  . COPD (chronic obstructive pulmonary disease) ([place])  . COPD GOLD  II  spirometry, severe Emphysema and 02 dep 11/22/2015  Quit smoking 1989 - Spirometry 09/11/2016  FEV1 1.03 (52%)  Ratio 54 mild curvature, on no rx - 09/11/2016  After extensive  coaching HFA effectiveness =    50% from baseline 10% > continue  symb 160 2bid but consider change to bevespi or stiolto  - 10/02/2017  After extensive coaching inhaler device,  effectiveness =    50% from a baseline near 0 > rechallenge with symbicort 160 x 2 bid x 2 weeks the . COPD with acute exacerbation ([place]) 10/28/2018 . Diabetes mellitus without complication ([name])   diet controlled- no meds. per pt . Diarrhea 04/02/2018 . Diverticulosis  . Elevated troponin 10/28/2018 . Essential hypertension 11/22/2015 . GERD (gastroesophageal reflux disease) 10/28/2018 . Hematuria 09/27/2017 . History of colon cancer 06/11/1986  Resected 1988, [place] . Hx of colonic polyps 02/10/2013 . Hyperlipidemia  . Hyperparathyroidism, primary ([place]) 07/27/2016 . Hypertension  . Hypertensive urgency 11/08/2018 . Hypoxia 10/30/2018 . ICH (intracerebral hemorrhage) ([place]) s/p tPA administration 11/08/2018 . Insomnia  . Lactic acidosis 10/28/2018 . Loss of appetite 06/02/2016 . Lower extremity edema 02/20/2018 . On home oxygen therapy 07/12/2018 . Primary hyperparathyroidism ([place])  . Protein-calorie malnutrition, severe 09/17/2017 . Pulmonary hypertension ([name]) c/w cor pulmonale   See [facility] 09/20/17  With  low CO ? Accuracy of PVR  - tyvaso d/c'd 10/03/18 by [facility] due to cough, improved    . SBO (small bowel obstruction) ([place])  . Stroke ([place]) 10/2018  tpa administered . Supplemental oxygen dependent  . Tubular adenoma of rectum 02/23/2013  low grade . Type 2 diabetes mellitus ([place]) 04/26/2018 . Unintended weight loss 04/26/2018 . UTI (urinary tract infection) 03/05/2019 . Vaginal candidiasis  . Vitamin D deficiency  Past Surgical History: Past Surgical History: Procedure Laterality Date . ABDOMINAL HYSTERECTOMY  08/2015 . BREAST BIOPSY Left  . BREAST EXCISIONAL BIOPSY Right  . COLON RESECTION  1980's . COLONOSCOPY   . ENDOMETRIAL BIOPSY  2009  negative . INCONTINENCE SURGERY  2017 . LOOP RECORDER INSERTION N/A 11/08/2018  Procedure: LOOP RECORDER  INSERTION;  Surgeon: Constance Haw, MD;  Location: [facility];  Service: Cardiovascular;  Laterality: N/A; . RIGHT HEART CATH N/A 09/20/2017  Procedure: RIGHT HEART CATH;  Surgeon: Larey Dresser, MD;  Location: [facility];  Service: Cardiovascular;  Laterality: N/A; . RIGHT/LEFT HEART CATH AND CORONARY ANGIOGRAPHY N/A 03/19/2018  Procedure: RIGHT/LEFT HEART CATH AND CORONARY ANGIOGRAPHY;  Surgeon: Larey Dresser, MD;  Location: [facility];  Service: Cardiovascular;  Laterality: N/A; HPI: Pt is a 74 y.o. female with medical history significant of hypertension, hyperlipidemia, type 2 diabetes mellitus, chronic hypoxemic respiratory failure due to underlying COPD-on  5 L of oxygen at home, pulmonary hypertension, ischemic stroke s/p TPA-currently on Xarelto, protein calorie malnutrition, grade 1 diastolic congestive heart failure, who presented to emergency department due to worsening shortness of breath. CXR was negative for acute cardiopulmonary disease. MBS 11/29/18: decreased bolus organization, premature spillage of thin liquids with trace aspiration before laryngeal-vestibular closure. Nectars were noted to penetrate the larynx, but were not aspirated.  There was minimal residue post-swallow.  Aspiration was accompanied  by a mild cough.  Subjective: Pt was alert and tachypneic Assessment / Plan / Recommendation CHL IP CLINICAL IMPRESSIONS 04/21/2019 Clinical Impression Pt was seen for a modified barium swallow study and she presents with mild oropharyngeal dysphagia with resultant laryngeal penetration of thin liquid and nectar-thick liquid on today's examination.  Laryngeal penetration occurred before the swallow secondary to delayed swallow initiation, and it was transient with thin liquid and nectar-thick liquid trials except for thin liquid via cup sip.  No aspiration was observed with any trials and no laryngeal penetration was observed with honey-thick liquid, puree, or  regular solid trials.  Of note, pt had tachypnea following thin liquid and nectar-thick liquid trials.  Oral phase was remarkable for prolonged mastication of regular solids, reduced lingual control resulting in premature spillage to the pharynx, and reduced lingual strength resulting in trace-mild oral residue.  Pharyngeal phase was remarkable for reduced hyolaryngeal elevation/excursion resulting in vallecular and pyriform residue, reduced BOT retraction resulting in pyriform residue, and reduced pharyngeal constriction resulting posterior pharyngeal wall residue.  Due to increased risk of aspiration with thin liquid and nectar-thick liquid given laryngeal penetration and tachypnea, recommend initiation of Dysphagia 2 (fine chop) solids and honey-thick liquids with medications administered whole in puree.  Pt may have ice chips and sips of water with full RN supervision before or 30+ minutes after a meal following thorough oral care.  If pt/family wish to pursue a more liberalized diet with known risk of aspiration, would recommend soft/regular solids and thin liquids with use of compensatory strategies.   SLP Visit Diagnosis Dysphagia, oropharyngeal phase (R13.12) Attention and concentration deficit following -- Frontal lobe and executive function deficit following -- Impact on safety and function Mild aspiration risk;Moderate aspiration risk   CHL IP TREATMENT RECOMMENDATION 04/21/2019 Treatment Recommendations Therapy as outlined in treatment plan below   Prognosis 04/21/2019 Prognosis for Safe Diet Advancement Fair Barriers to Reach Goals Time post onset;Cognitive deficits Barriers/Prognosis Comment -- CHL IP DIET RECOMMENDATION 04/21/2019 SLP Diet Recommendations Dysphagia 2 (Fine chop) solids;Honey thick liquids;Free water protocol after oral care;Ice chips PRN after oral care Liquid Administration via Cup;Straw Medication Administration Whole meds with puree Compensations Minimize environmental  distractions;Slow rate;Small sips/bites Postural Changes Seated upright at 90 degrees   CHL IP OTHER RECOMMENDATIONS 04/21/2019 Recommended Consults -- Oral Care Recommendations Oral care BID;Oral care before and after PO Other Recommendations Prohibited food (jello, ice cream, thin soups);Order thickener from pharmacy   CHL IP FOLLOW UP RECOMMENDATIONS 04/21/2019 Follow up Recommendations (No Data)   CHL IP FREQUENCY AND DURATION 04/21/2019 Speech Therapy Frequency (ACUTE ONLY) min 2x/week Treatment Duration 2 weeks      CHL IP ORAL PHASE 04/21/2019 Oral Phase Impaired Oral - Pudding Teaspoon -- Oral - Pudding Cup -- Oral - Honey Teaspoon -- Oral - Honey Cup Lingual/palatal residue;Piecemeal swallowing Oral - Nectar Teaspoon -- Oral - Nectar Cup Lingual/palatal residue Oral - Nectar Straw Lingual/palatal residue Oral - Thin Teaspoon -- Oral - Thin Cup Lingual/palatal residue Oral - Thin Straw Lingual/palatal residue;Piecemeal swallowing Oral - Puree Delayed oral transit;Lingual/palatal residue Oral - Mech Soft -- Oral - Regular Delayed oral transit;Lingual/palatal residue;Impaired mastication Oral - Multi-Consistency -- Oral - Pill -- Oral Phase - Comment --  CHL IP PHARYNGEAL PHASE 04/21/2019 Pharyngeal Phase Impaired Pharyngeal- Pudding Teaspoon -- Pharyngeal -- Pharyngeal- Pudding Cup -- Pharyngeal -- Pharyngeal- Honey Teaspoon -- Pharyngeal -- Pharyngeal- Honey Cup Delayed swallow initiation-pyriform sinuses;Pharyngeal residue - valleculae;Reduced tongue base retraction;Reduced laryngeal elevation;Reduced anterior laryngeal mobility Pharyngeal Material  does not enter airway Pharyngeal- Nectar Teaspoon -- Pharyngeal -- Pharyngeal- Nectar Cup Delayed swallow initiation-pyriform sinuses;Pharyngeal residue - valleculae;Pharyngeal residue - posterior pharnyx;Reduced pharyngeal peristalsis;Reduced anterior laryngeal mobility;Reduced laryngeal elevation;Reduced tongue base retraction;Reduced airway/laryngeal  closure;Penetration/Aspiration before swallow Pharyngeal Material enters airway, remains ABOVE vocal cords then ejected out Pharyngeal- Nectar Straw Delayed swallow initiation-pyriform sinuses;Reduced pharyngeal peristalsis;Reduced anterior laryngeal mobility;Reduced laryngeal elevation;Reduced airway/laryngeal closure;Reduced tongue base retraction;Penetration/Aspiration before swallow;Pharyngeal residue - valleculae;Pharyngeal residue - posterior pharnyx Pharyngeal Material enters airway, remains ABOVE vocal cords then ejected out Pharyngeal- Thin Teaspoon -- Pharyngeal -- Pharyngeal- Thin Cup Delayed swallow initiation-pyriform sinuses;Pharyngeal residue - pyriform;Pharyngeal residue - valleculae;Reduced anterior laryngeal mobility;Reduced laryngeal elevation;Reduced airway/laryngeal closure;Reduced tongue base retraction;Penetration/Aspiration before swallow Pharyngeal Material enters airway, remains ABOVE vocal cords and not ejected out Pharyngeal- Thin Straw Delayed swallow initiation-pyriform sinuses;Reduced pharyngeal peristalsis;Reduced anterior laryngeal mobility;Reduced laryngeal elevation;Reduced airway/laryngeal closure;Reduced tongue base retraction;Penetration/Aspiration before swallow Pharyngeal Material enters airway, remains ABOVE vocal cords then ejected out Pharyngeal- Puree Pharyngeal residue - valleculae;Reduced anterior laryngeal mobility;Reduced laryngeal elevation;Reduced tongue base retraction Pharyngeal Material does not enter airway Pharyngeal- Mechanical Soft -- Pharyngeal -- Pharyngeal- Regular Delayed swallow initiation-vallecula;Reduced laryngeal elevation;Reduced anterior laryngeal mobility Pharyngeal Material does not enter airway Pharyngeal- Multi-consistency -- Pharyngeal -- Pharyngeal- Pill -- Pharyngeal -- Pharyngeal Comment --  No flowsheet data found. Colin Mulders., M.S., CCC-SLP [facility] Office: [phone] [place] 04/21/2019, 3:46 PM               ECHOCARDIOGRAM COMPLETE  Result Date: 04/22/2019    ECHOCARDIOGRAM REPORT   Patient Name:   Denise [name] Date of Exam: 04/22/2019 Medical Rec #:  [id number]            Height:       64.0 in Accession #:    [id number]           Weight:       102.7 lb Date of Birth:  [birth year]            BSA:          1.474 m Patient Age:    89 years             BP:           130/84 mmHg Patient Gender: F                    HR:           78 bpm. Exam Location:  Inpatient Procedure: 2D Echo Indications:    CHF-Acute Diastolic 657.84 / O96.29  History:        Patient has prior history of Echocardiogram examinations, most                 recent 11/05/2018. COPD; Risk Factors:Hypertension, Diabetes and                 Dyslipidemia. Elevated troponin                 Chronic kidney disease                 Sepsis.  Sonographer:    Vikki Ports Turrentine Referring Phys: [id number] [place]  1. Normal LV systolic function; severe LVH; grade 1 diastolic dysfunction; D shaped septum suggestive of pulmonary hypertension; moderate RAE/RVE; severe RV dysfunction; mild TR with severe pulmonary hypertension; possible PFO; small pericardial effusion.  2. Left ventricular ejection fraction, by estimation, is 60 to 65%. The left ventricle has normal  function. The left ventricle has no regional wall motion abnormalities. There is severe left ventricular hypertrophy. Left ventricular diastolic parameters  are consistent with Grade I diastolic dysfunction (impaired relaxation).  3. Right ventricular systolic function is severely reduced. The right ventricular size is moderately enlarged. There is severely elevated pulmonary artery systolic pressure.  4. Right atrial size was moderately dilated.  5. Large pleural effusion in the left lateral region.  6. The mitral valve is normal in structure. Trivial mitral valve regurgitation. No evidence of mitral stenosis.  7. The aortic valve is tricuspid. Aortic valve regurgitation is not  visualized. Mild aortic valve sclerosis is present, with no evidence of aortic valve stenosis.  8. The inferior vena cava is normal in size with greater than 50% respiratory variability, suggesting right atrial pressure of 3 mmHg. FINDINGS  Left Ventricle: Left ventricular ejection fraction, by estimation, is 60 to 65%. The left ventricle has normal function. The left ventricle has no regional wall motion abnormalities. The left ventricular internal cavity size was normal in size. There is  severe left ventricular hypertrophy. Left ventricular diastolic parameters are consistent with Grade I diastolic dysfunction (impaired relaxation). Right Ventricle: The right ventricular size is moderately enlarged.Right ventricular systolic function is severely reduced. There is severely elevated pulmonary artery systolic pressure. The tricuspid regurgitant velocity is 4.68 m/s, and with an assumed  right atrial pressure of 15 mmHg, the estimated right ventricular systolic pressure is 500.3 mmHg. Left Atrium: Left atrial size was normal in size. Right Atrium: Right atrial size was moderately dilated. Pericardium: A small pericardial effusion is present. Mitral Valve: The mitral valve is normal in structure. Normal mobility of the mitral valve leaflets. Trivial mitral valve regurgitation. No evidence of mitral valve stenosis. Tricuspid Valve: The tricuspid valve is normal in structure. Tricuspid valve regurgitation is mild . No evidence of tricuspid stenosis. Aortic Valve: The aortic valve is tricuspid. Aortic valve regurgitation is not visualized. Mild aortic valve sclerosis is present, with no evidence of aortic valve stenosis. Pulmonic Valve: The pulmonic valve was normal in structure. Pulmonic valve regurgitation is not visualized. No evidence of pulmonic stenosis. Aorta: The aortic root is normal in size and structure. Venous: The inferior vena cava is normal in size with greater than 50% respiratory variability, suggesting  right atrial pressure of 3 mmHg. IAS/Shunts: There is left bowing of the interatrial septum, suggestive of elevated right atrial pressure. Additional Comments: Normal LV systolic function; severe LVH; grade 1 diastolic dysfunction; D shaped septum suggestive of pulmonary hypertension; moderate RAE/RVE; severe RV dysfunction; mild TR with severe pulmonary hypertension; possible PFO; small pericardial effusion. There is a large pleural effusion in the left lateral region.  LEFT VENTRICLE PLAX 2D LVIDd:         2.70 cm LVIDs:         2.00 cm LV PW:         1.50 cm LV IVS:        1.50 cm LVOT diam:     1.90 cm LVOT Area:     2.84 cm  RIGHT VENTRICLE TAPSE (M-mode): 1.3 cm LEFT ATRIUM           Index       RIGHT ATRIUM           Index LA diam:      3.50 cm 2.37 cm/m  RA Area:     19.60 cm LA Vol (A4C): 22.7 ml 15.40 ml/m RA Volume:   59.20 ml  40.16 ml/m  AORTA Ao Root diam: 2.80 cm TRICUSPID VALVE TR Peak grad:   87.6 mmHg TR Vmax:        468.00 cm/s  SHUNTS Systemic Diam: 1.90 cm Kirk Ruths MD Electronically signed by Kirk Ruths MD Signature Date/Time: 04/22/2019/2:44:02 PM    Final     Microbiology: Recent Results (from the past 240 hour(s))  Blood Culture (routine x 2)     Status: None (Preliminary result)   Collection Time: 04/19/19 11:57 AM   Specimen: BLOOD LEFT FOREARM  Result Value Ref Range Status   Specimen Description BLOOD LEFT FOREARM  Final   Special Requests   Final    BOTTLES DRAWN AEROBIC AND ANAEROBIC Blood Culture results may not be optimal due to an inadequate volume of blood received in culture bottles   Culture   Final    NO GROWTH 4 DAYS Performed at [facility], [place] [address] [place], [place] [address]    Report Status PENDING  Incomplete  Blood Culture (routine x 2)     Status: None (Preliminary result)   Collection Time: 04/19/19 11:58 AM   Specimen: BLOOD RIGHT WRIST  Result Value Ref Range Status   Specimen Description BLOOD RIGHT WRIST  Final    Special Requests   Final    BOTTLES DRAWN AEROBIC ONLY Blood Culture results may not be optimal due to an inadequate volume of blood received in culture bottles   Culture   Final    NO GROWTH 4 DAYS Performed at [facility], [place] [address] [place], [place] [address]    Report Status PENDING  Incomplete  Urine culture     Status: Abnormal   Collection Time: 04/19/19  2:49 PM   Specimen: In/Out Cath Urine  Result Value Ref Range Status   Specimen Description IN/OUT CATH URINE  Final   Special Requests   Final    NONE Performed at [facility], [place] [address], [place], [place] [address]    Culture >=100,000 COLONIES/mL ESCHERICHIA COLI (A)  Final   Report Status 04/21/2019 FINAL  Final   Organism ID, Bacteria ESCHERICHIA COLI (A)  Final      Susceptibility   Escherichia coli - MIC*    AMPICILLIN >=32 RESISTANT Resistant     CEFAZOLIN <=4 SENSITIVE Sensitive     CEFTRIAXONE <=0.25 SENSITIVE Sensitive     CIPROFLOXACIN >=4 RESISTANT Resistant     GENTAMICIN >=16 RESISTANT Resistant     IMIPENEM <=0.25 SENSITIVE Sensitive     NITROFURANTOIN <=16 SENSITIVE Sensitive     TRIMETH/SULFA >=320 RESISTANT Resistant     AMPICILLIN/SULBACTAM >=32 RESISTANT Resistant     PIP/TAZO <=4 SENSITIVE Sensitive     * >=100,000 COLONIES/mL ESCHERICHIA COLI     Labs: Basic Metabolic Panel: Recent Labs  Lab 04/19/19 1217 04/19/19 1217 04/19/19 1251 04/19/19 1454 04/20/19 0248 04/21/19 0019 04/22/19 0125 04/23/19 0240  NA 143   < > 141  --  144 145 146* 149*  K 5.0   < > 3.8  --  4.5 4.4 4.0 3.9  CL 103  --   --   --  108 111 110 119*  CO2 30  --   --   --  _0 18*  GLUCOSE 224*  --   --   --  183* 240* 197* 136*  BUN 22  --   --   --  22 23 30* 34*  CREATININE 1.15*  --   --   --  1.01* 0.85 0.82 0.94  CALCIUM 11.1*  --   --   --  10.8* 10.8* 11.3* 11.3*  MG  --   --   --  1.8  --  1.7 1.8 1.9  PHOS  --   --   --   --   --  3.3 2.8 2.6   < > = values in this interval  not displayed.   Liver Function Tests: Recent Labs  Lab 04/19/19 1217 04/20/19 0248 04/21/19 0019 04/22/19 0125 04/23/19 0240  AST 35 _0 35  ALT _1 ALKPHOS 96 84 69 73 63  BILITOT 1.0 0.8 0.7 0.5 0.5  PROT 6.8 6.4* 5.8* 6.0* 5.6*  ALBUMIN 3.9 3.7 3.6 3.7 3.4*   CBC: Recent Labs  Lab 04/19/19 1217 04/19/19 1217 04/19/19 1251 04/20/19 0248 04/21/19 0019 04/22/19 0125 04/23/19 0240  WBC 3.9*  --   --  3.3* 4.5 7.4 6.4  NEUTROABS 2.8  --   --   --   --   --   --   HGB 13.2   < > 12.2 12.5 10.9* 12.9 11.1*  HCT 43.9   < > 36.0 40.7 35.1* 41.4 37.0  MCV 88.5  --   --  87.0 87.1 87.0 88.5  PLT 175  --   --  192 162 154 151   < > = values in this interval not displayed.   BNP (last 3 results) Recent Labs    12/30/18 1148 12/31/18 0332 04/19/19 1217  BNP 908.4* 785.1* 1,774.6*    ProBNP (last 3 results) Recent Labs    05/02/18 1249 06/24/18 1111  PROBNP 910.0* 840.0*    CBG: Recent Labs  Lab 04/22/19 1135 04/22/19 1650 04/22/19 2038 04/23/19 0842 04/23/19 1204  GLUCAP 147* 164* 189* 159* 160*    Signed:  Barton Dubois MD.  [facility] 04/23/2019, 1:13 PM

## 2019-04-23 NOTE — Progress Notes (Signed)
Patient's HR is increasing to 110-120, Cardizem drip increased 2.5 mg/hr as per order, to 10 mg/ hr. Primary nurse Mo notified. Also, patient "jumpy" per family, restless, given Morphine 5 mg about 30 minutes ago.  Will give prn Ativan as ordered.  Family agreeable.

## 2019-04-24 ENCOUNTER — Other Ambulatory Visit (HOSPITAL_COMMUNITY): Payer: Self-pay | Admitting: Cardiology

## 2019-04-24 DIAGNOSIS — R404 Transient alteration of awareness: Secondary | ICD-10-CM | POA: Diagnosis not present

## 2019-04-24 DIAGNOSIS — R509 Fever, unspecified: Secondary | ICD-10-CM | POA: Diagnosis not present

## 2019-04-24 DIAGNOSIS — R069 Unspecified abnormalities of breathing: Secondary | ICD-10-CM | POA: Diagnosis not present

## 2019-04-24 DIAGNOSIS — I499 Cardiac arrhythmia, unspecified: Secondary | ICD-10-CM | POA: Diagnosis not present

## 2019-04-24 LAB — CULTURE, BLOOD (ROUTINE X 2)
Culture: NO GROWTH
Culture: NO GROWTH

## 2019-04-28 ENCOUNTER — Ambulatory Visit (INDEPENDENT_AMBULATORY_CARE_PROVIDER_SITE_OTHER): Payer: Medicare HMO | Admitting: *Deleted

## 2019-04-28 DIAGNOSIS — I63511 Cerebral infarction due to unspecified occlusion or stenosis of right middle cerebral artery: Secondary | ICD-10-CM

## 2019-04-28 DIAGNOSIS — R0600 Dyspnea, unspecified: Secondary | ICD-10-CM

## 2019-04-28 LAB — CUP PACEART REMOTE DEVICE CHECK
Date Time Interrogation Session: 20210421230447
Implantable Pulse Generator Implant Date: 20201106

## 2019-04-28 NOTE — Progress Notes (Signed)
ILR Remote 

## 2019-05-03 DIAGNOSIS — 419620001 Death: Secondary | SNOMED CT | POA: Diagnosis not present

## 2019-05-03 DEATH — deceased

## 2019-05-06 ENCOUNTER — Encounter (HOSPITAL_COMMUNITY): Payer: Medicare HMO | Admitting: Cardiology

## 2019-05-20 ENCOUNTER — Encounter (HOSPITAL_COMMUNITY): Payer: Medicare HMO

## 2019-05-26 ENCOUNTER — Ambulatory Visit: Payer: Medicare HMO | Admitting: Internal Medicine

## 2019-06-25 ENCOUNTER — Ambulatory Visit: Payer: Medicare HMO | Admitting: Adult Health

## 2019-07-08 ENCOUNTER — Encounter (HOSPITAL_COMMUNITY): Payer: Medicare HMO | Admitting: Cardiology

## 2021-08-18 IMAGING — CT CT HEAD W/O CM
3 series · 14 of 46 positions shown, 16 images · non-contrast
Comparison: Brain MRI 11/27/2018 and earlier.

CLINICAL DATA: 73-year-old female with acute right thalamic
infarct, subacute right cerebellar hemorrhagic infarct.

EXAM:
CT HEAD WITHOUT CONTRAST
TECHNIQUE: Contiguous axial images were obtained from the base of the skull
through the vertex without intravenous contrast.

[Series 4: head 5.0 h30s · axial · 0.45mm/px · z∈[-109,+11]mm · 8 of 29 slices shown, 10 images]
[im 3/29  brain]
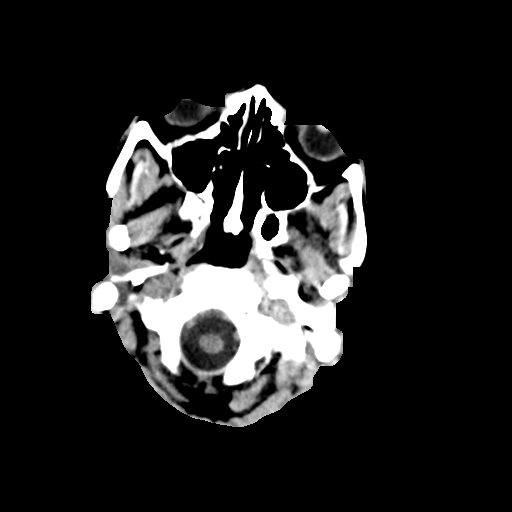
[im 3/29  bone]
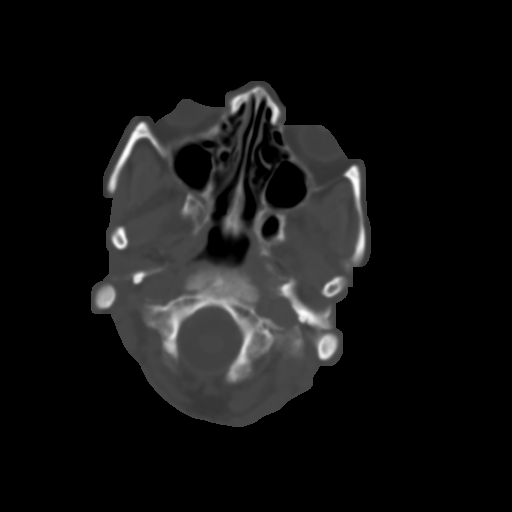
[im 7/29  brain]
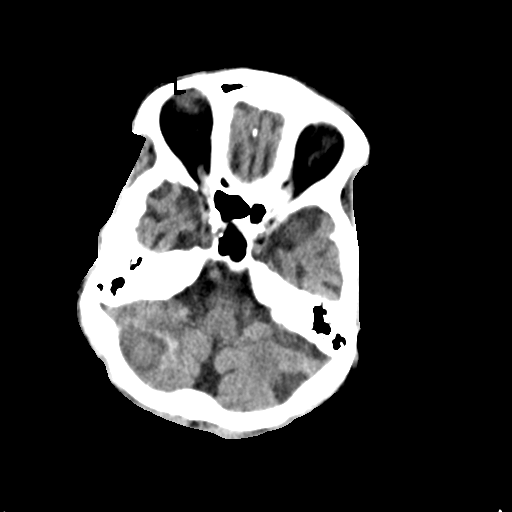
[im 10/29  brain]
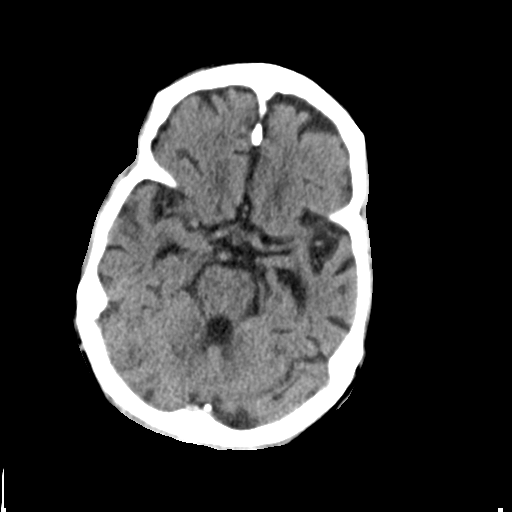
[im 13/29  brain]
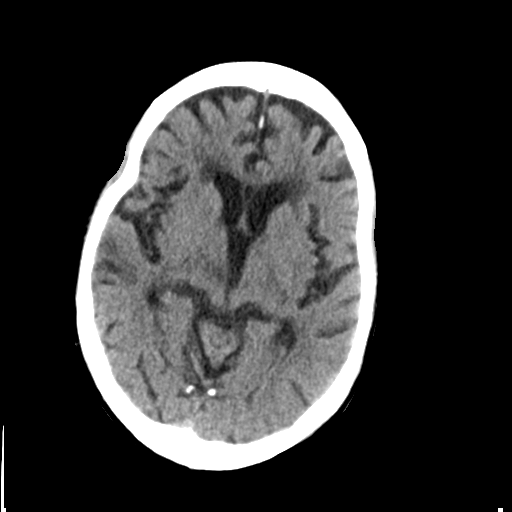
[im 17/29  brain]
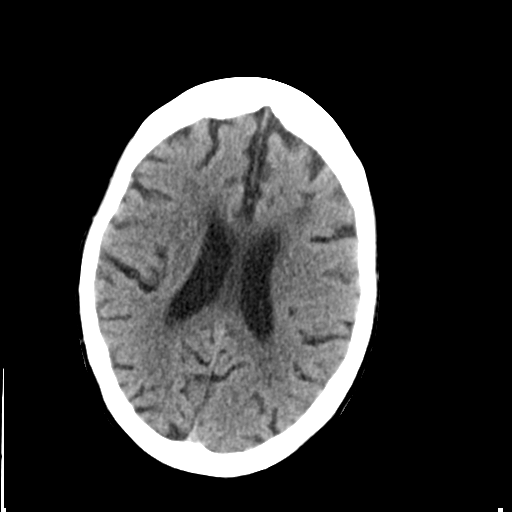
[im 17/29  bone]
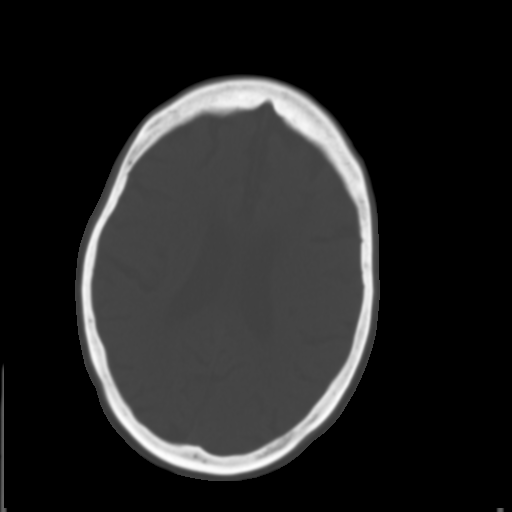
[im 20/29  brain]
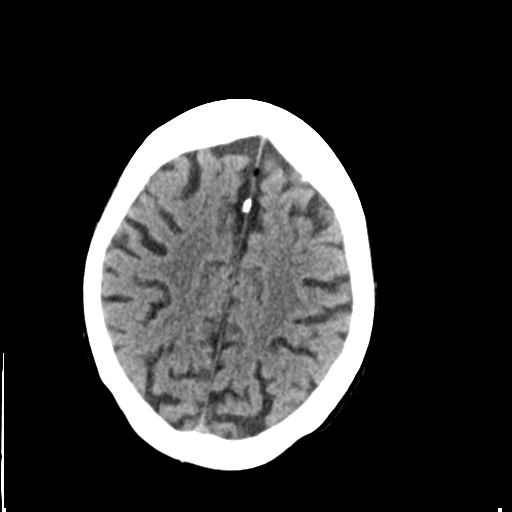
[im 23/29  brain]
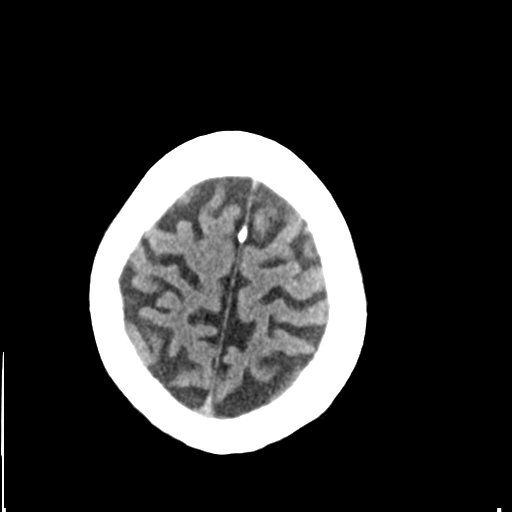
[im 27/29  brain]
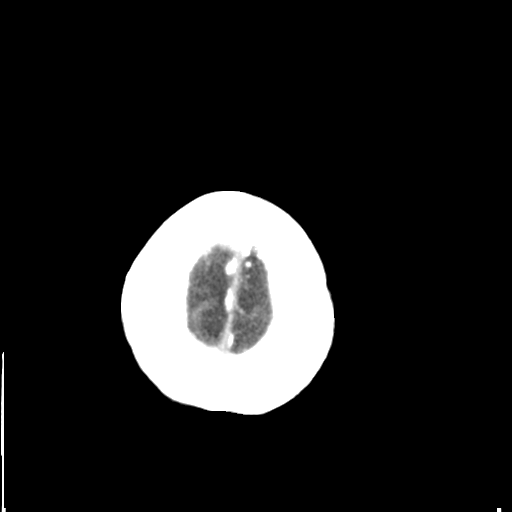

[Series 5: head 3.0 mpr cor · coronal · 0.30mm/px · 3 of 67 slices shown]
[im 23/67  brain]
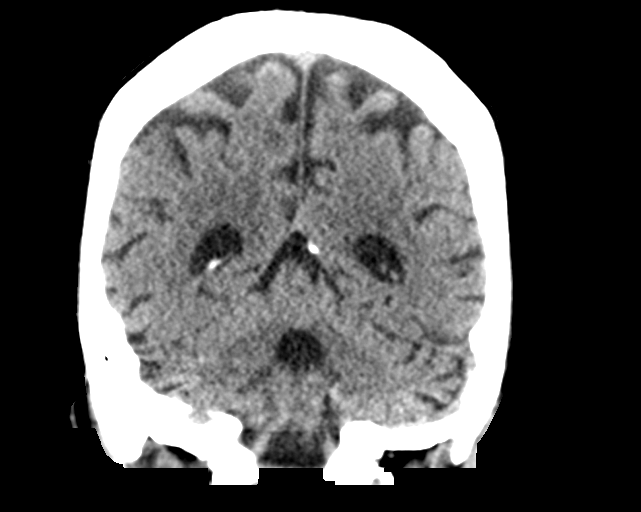
[im 30/67  brain]
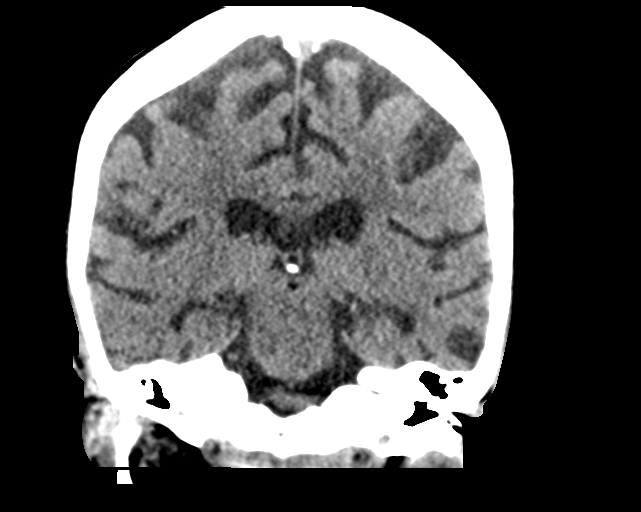
[im 37/67  brain]
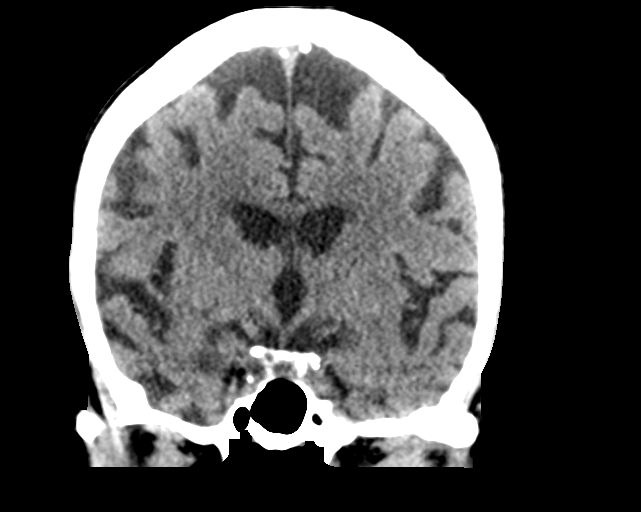

[Series 6: head 3.0 mpr sag · sagittal · 0.31mm/px · 3 of 67 slices shown]
[im 23/67  brain]
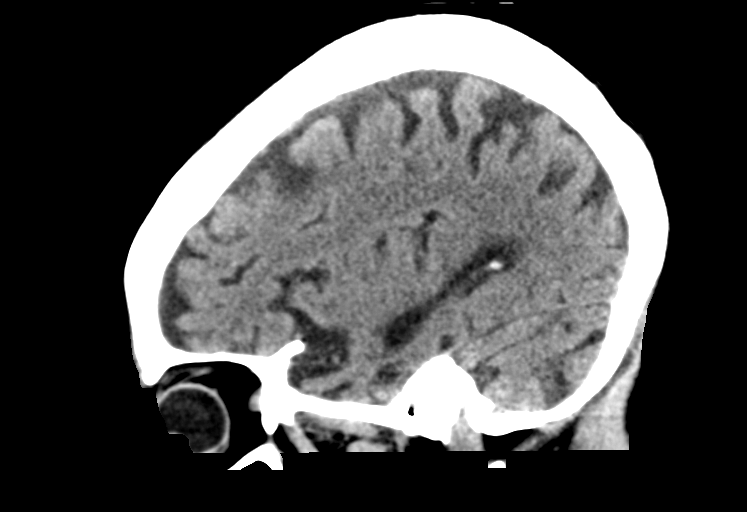
[im 34/67  brain]
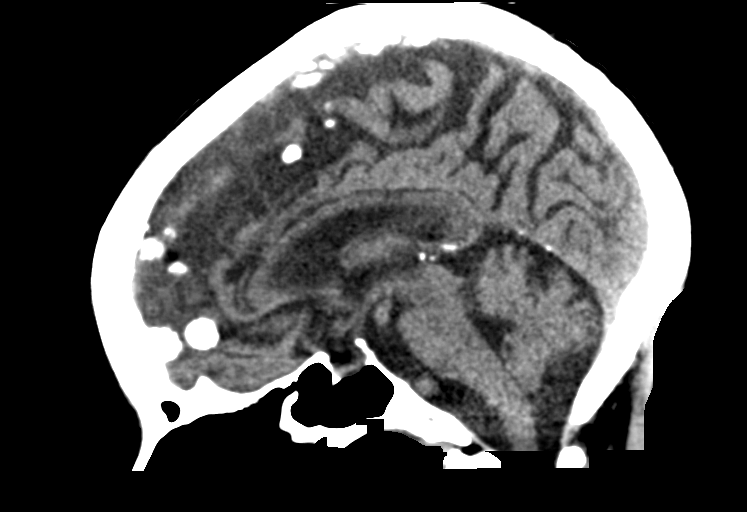
[im 45/67  brain]
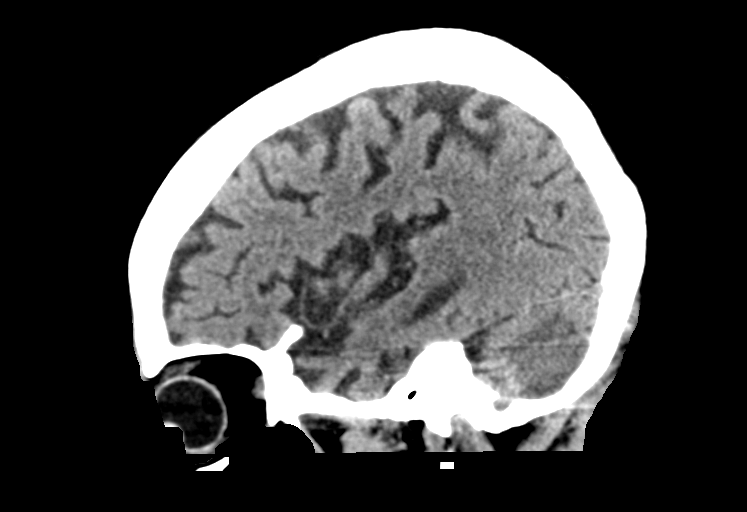

[14 of 46 positions shown; findings below may reference images not displayed]

FINDINGS: Brain: Expected evolution of hypodensity in the right thalamus since
the CT on 11/27/2018 (series 4, image 14). No associated hemorrhage
or mass effect.

Stable right cerebellum PICA territory infarct with petechial
hemorrhage. No significant posterior fossa mass effect.

Stable gray-white matter differentiation throughout the brain
elsewhere with superimposed smaller chronic left cerebellar and deep
gray nuclei infarcts. Small area of cortical encephalomalacia in the
posterior right temporal lobe. No midline shift, ventriculomegaly,
mass effect, evidence of mass lesion, or evidence of new cortically
based infarction.

Vascular: Calcified atherosclerosis at the skull base. No suspicious
intracranial vascular hyperdensity.

Skull: No acute osseous abnormality identified.

Sinuses/Orbits: Visualized paranasal sinuses and mastoids are stable
and well pneumatized.

Other: No acute orbit or scalp soft tissue findings.
IMPRESSION: 1. Expected evolution of the right thalamic infarct since
11/27/2018. No associated hemorrhage or mass effect.
2. Stable right cerebellum PICA infarct with petechial hemorrhage,
no malignant hemorrhagic transformation or mass effect.
3. No new intracranial abnormality.
